# Patient Record
Sex: Male | Born: 1940 | Race: White | Hispanic: No | Marital: Single | Smoking: Current every day smoker
Health system: Southern US, Community
[De-identification: ages and names within clinical notes are randomized; demographics above are authoritative.]

## PROBLEM LIST (undated history)

## (undated) DIAGNOSIS — Z8739 Personal history of other diseases of the musculoskeletal system and connective tissue: Secondary | ICD-10-CM

## (undated) DIAGNOSIS — Z8719 Personal history of other diseases of the digestive system: Secondary | ICD-10-CM

## (undated) DIAGNOSIS — E785 Hyperlipidemia, unspecified: Secondary | ICD-10-CM

## (undated) DIAGNOSIS — N529 Male erectile dysfunction, unspecified: Secondary | ICD-10-CM

## (undated) DIAGNOSIS — D126 Benign neoplasm of colon, unspecified: Secondary | ICD-10-CM

## (undated) DIAGNOSIS — R011 Cardiac murmur, unspecified: Secondary | ICD-10-CM

## (undated) DIAGNOSIS — I4891 Unspecified atrial fibrillation: Secondary | ICD-10-CM

## (undated) DIAGNOSIS — K5730 Diverticulosis of large intestine without perforation or abscess without bleeding: Secondary | ICD-10-CM

## (undated) DIAGNOSIS — H269 Unspecified cataract: Secondary | ICD-10-CM

## (undated) DIAGNOSIS — Z973 Presence of spectacles and contact lenses: Secondary | ICD-10-CM

## (undated) DIAGNOSIS — Z972 Presence of dental prosthetic device (complete) (partial): Secondary | ICD-10-CM

## (undated) DIAGNOSIS — Z86718 Personal history of other venous thrombosis and embolism: Secondary | ICD-10-CM

## (undated) DIAGNOSIS — M1990 Unspecified osteoarthritis, unspecified site: Secondary | ICD-10-CM

## (undated) DIAGNOSIS — Z860101 Personal history of adenomatous and serrated colon polyps: Secondary | ICD-10-CM

## (undated) DIAGNOSIS — K863 Pseudocyst of pancreas: Secondary | ICD-10-CM

## (undated) DIAGNOSIS — E119 Type 2 diabetes mellitus without complications: Secondary | ICD-10-CM

## (undated) DIAGNOSIS — N179 Acute kidney failure, unspecified: Secondary | ICD-10-CM

## (undated) DIAGNOSIS — K859 Acute pancreatitis without necrosis or infection, unspecified: Secondary | ICD-10-CM

## (undated) DIAGNOSIS — K219 Gastro-esophageal reflux disease without esophagitis: Secondary | ICD-10-CM

## (undated) DIAGNOSIS — N486 Induration penis plastica: Secondary | ICD-10-CM

## (undated) DIAGNOSIS — Z86010 Personal history of colonic polyps: Secondary | ICD-10-CM

## (undated) DIAGNOSIS — D709 Neutropenia, unspecified: Secondary | ICD-10-CM

## (undated) DIAGNOSIS — E782 Mixed hyperlipidemia: Secondary | ICD-10-CM

## (undated) DIAGNOSIS — K648 Other hemorrhoids: Secondary | ICD-10-CM

## (undated) DIAGNOSIS — I748 Embolism and thrombosis of other arteries: Secondary | ICD-10-CM

## (undated) DIAGNOSIS — K4090 Unilateral inguinal hernia, without obstruction or gangrene, not specified as recurrent: Secondary | ICD-10-CM

## (undated) DIAGNOSIS — T7840XA Allergy, unspecified, initial encounter: Secondary | ICD-10-CM

## (undated) DIAGNOSIS — N401 Enlarged prostate with lower urinary tract symptoms: Secondary | ICD-10-CM

## (undated) DIAGNOSIS — K8020 Calculus of gallbladder without cholecystitis without obstruction: Secondary | ICD-10-CM

## (undated) DIAGNOSIS — Z7901 Long term (current) use of anticoagulants: Secondary | ICD-10-CM

## (undated) DIAGNOSIS — D649 Anemia, unspecified: Secondary | ICD-10-CM

## (undated) DIAGNOSIS — N138 Other obstructive and reflux uropathy: Secondary | ICD-10-CM

## (undated) DIAGNOSIS — K5790 Diverticulosis of intestine, part unspecified, without perforation or abscess without bleeding: Secondary | ICD-10-CM

## (undated) DIAGNOSIS — I422 Other hypertrophic cardiomyopathy: Secondary | ICD-10-CM

## (undated) DIAGNOSIS — I10 Essential (primary) hypertension: Secondary | ICD-10-CM

## (undated) DIAGNOSIS — C61 Malignant neoplasm of prostate: Secondary | ICD-10-CM

## (undated) HISTORY — DX: Neutropenia, unspecified: D70.9

## (undated) HISTORY — DX: Essential (primary) hypertension: I10

## (undated) HISTORY — DX: Allergy, unspecified, initial encounter: T78.40XA

## (undated) HISTORY — DX: Other hemorrhoids: K64.8

## (undated) HISTORY — DX: Cardiac murmur, unspecified: R01.1

## (undated) HISTORY — DX: Calculus of gallbladder without cholecystitis without obstruction: K80.20

## (undated) HISTORY — PX: ESOPHAGOGASTRODUODENOSCOPY: SHX1529

## (undated) HISTORY — PX: HEMORRHOID SURGERY: SHX153

## (undated) HISTORY — DX: Diverticulosis of intestine, part unspecified, without perforation or abscess without bleeding: K57.90

## (undated) HISTORY — DX: Benign neoplasm of colon, unspecified: D12.6

## (undated) HISTORY — PX: COLONOSCOPY: SHX174

## (undated) HISTORY — DX: Male erectile dysfunction, unspecified: N52.9

## (undated) HISTORY — PX: SIGMOIDOSCOPY: SUR1295

## (undated) HISTORY — DX: Gastro-esophageal reflux disease without esophagitis: K21.9

## (undated) HISTORY — DX: Hyperlipidemia, unspecified: E78.5

## (undated) HISTORY — DX: Unspecified cataract: H26.9

## (undated) HISTORY — DX: Malignant neoplasm of prostate: C61

## (undated) HISTORY — DX: Acute pancreatitis without necrosis or infection, unspecified: K85.90

---
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
## 2005-06-05 ENCOUNTER — Ambulatory Visit: Payer: Self-pay | Admitting: Cardiology

## 2005-07-26 ENCOUNTER — Ambulatory Visit: Payer: Self-pay | Admitting: Cardiology

## 2006-01-24 ENCOUNTER — Encounter: Admission: RE | Admit: 2006-01-24 | Discharge: 2006-01-24 | Payer: Self-pay | Admitting: Nephrology

## 2006-01-29 ENCOUNTER — Ambulatory Visit (HOSPITAL_COMMUNITY): Admission: RE | Admit: 2006-01-29 | Discharge: 2006-01-29 | Payer: Self-pay | Admitting: Nephrology

## 2007-01-16 ENCOUNTER — Inpatient Hospital Stay (HOSPITAL_COMMUNITY): Admission: EM | Admit: 2007-01-16 | Discharge: 2007-01-24 | Payer: Self-pay | Admitting: Emergency Medicine

## 2007-01-17 ENCOUNTER — Ambulatory Visit: Payer: Self-pay | Admitting: Oncology

## 2007-01-17 ENCOUNTER — Encounter: Payer: Self-pay | Admitting: Oncology

## 2007-01-20 IMAGING — CR DG CHEST 2V
2 series · 2 of 2 positions shown · non-contrast
Comparison: [DATE].

CLINICAL DATA: Neutropenia, fever, high blood pressure. 
 CHEST ? 2 VIEW:

[w chest lat]
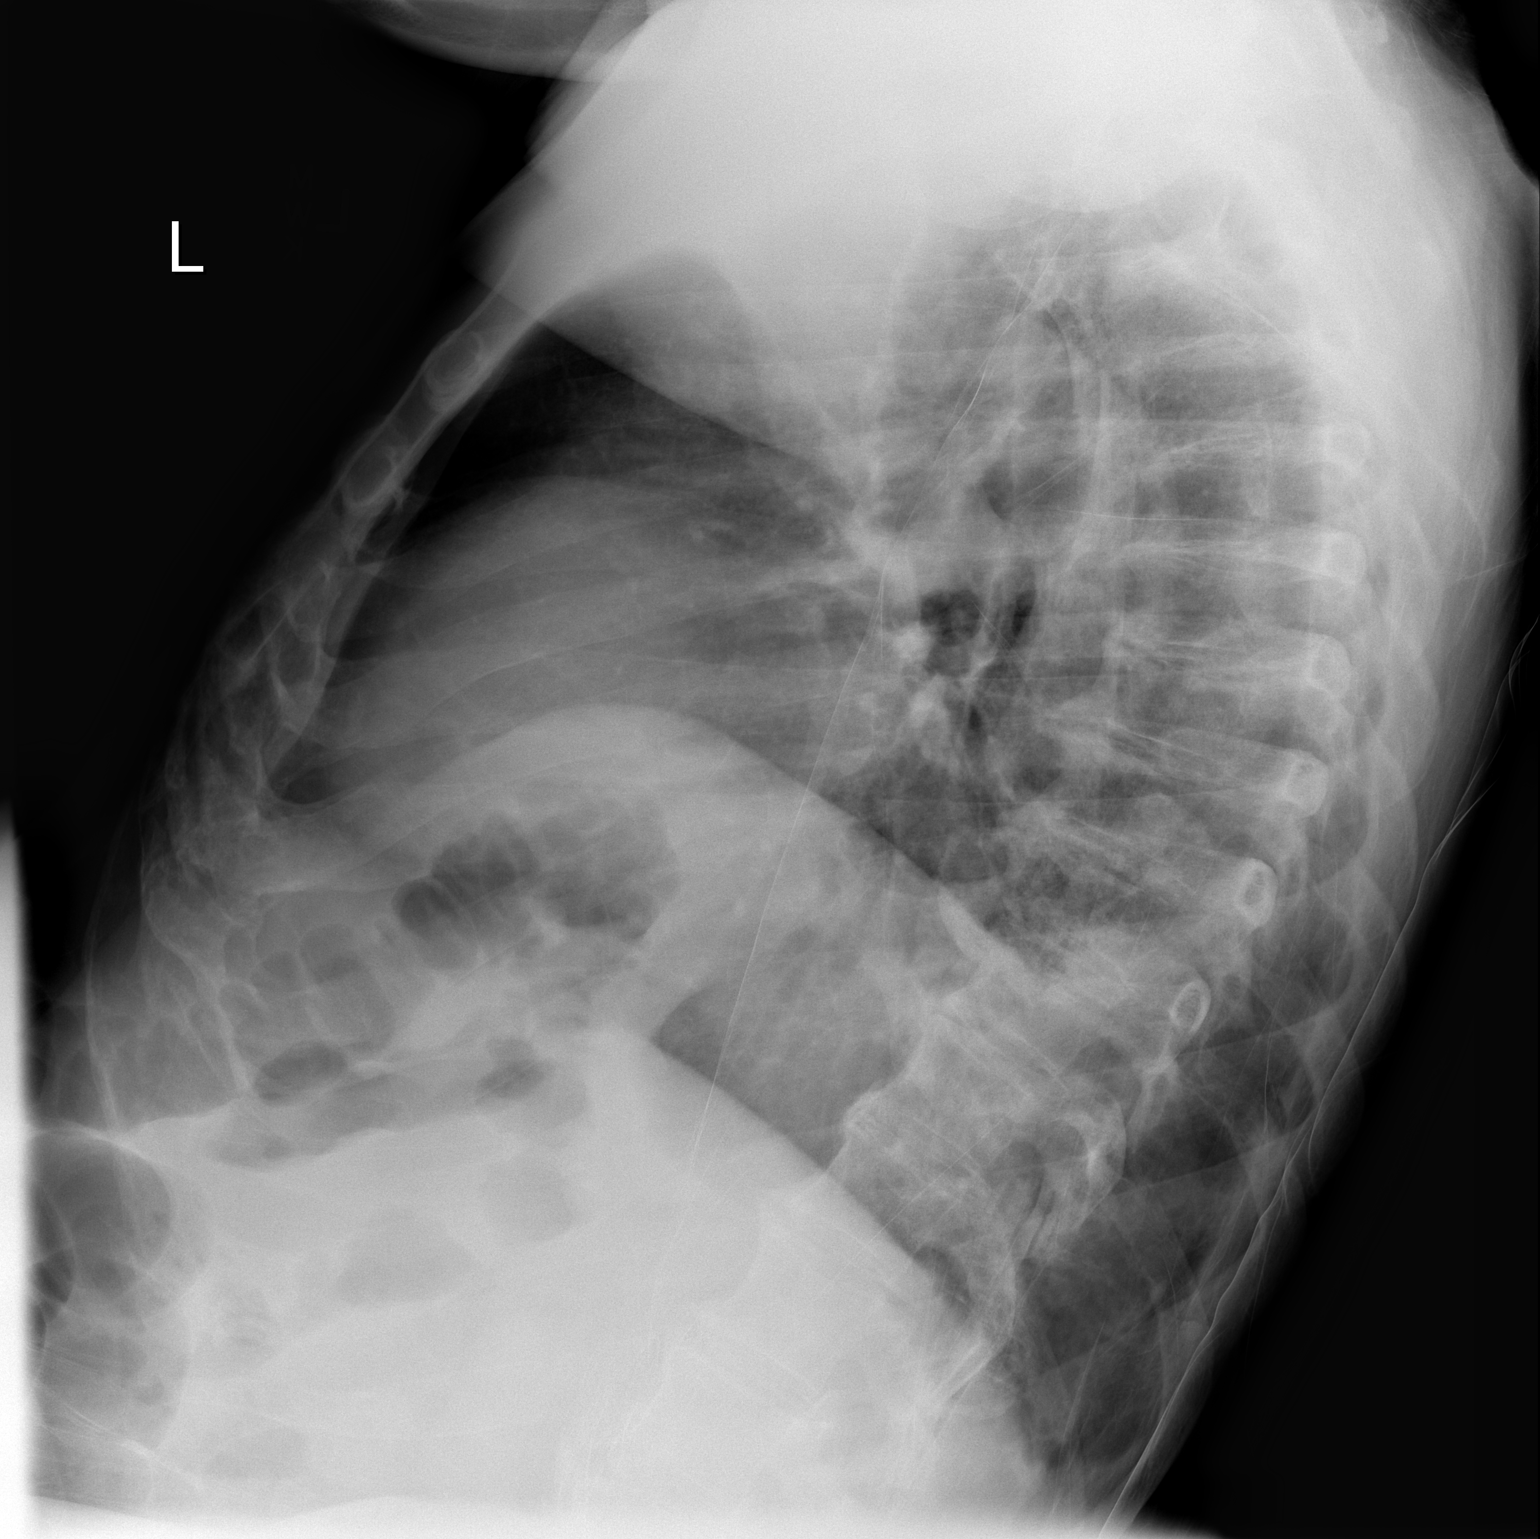

[view not recorded]
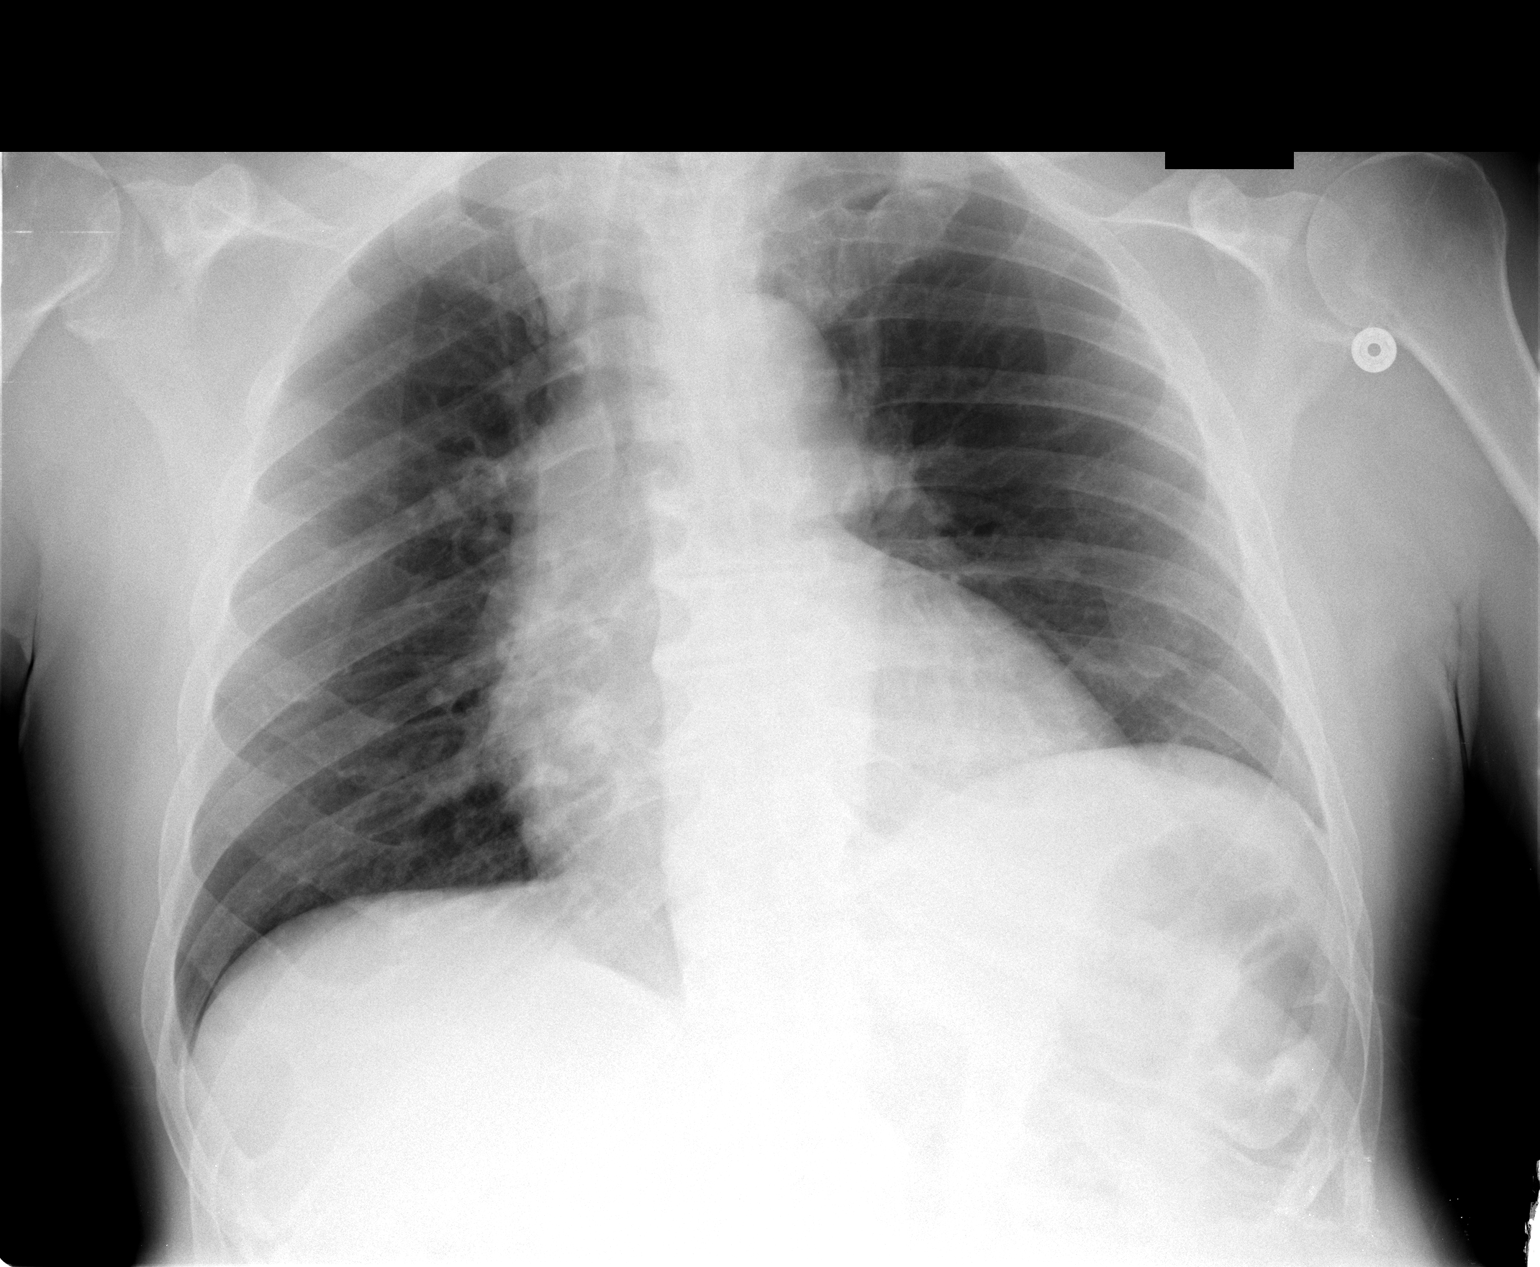

[2 of 2 positions shown; findings below may reference images not displayed]

FINDINGS: Cardiac size upper normal.  No active infiltrate.  No airspace densities.
IMPRESSION: Negative for pneumonia.

## 2007-03-27 ENCOUNTER — Ambulatory Visit: Payer: Self-pay | Admitting: Gastroenterology

## 2007-03-27 LAB — CONVERTED CEMR LAB
Folate: 9.5 ng/mL
Transferrin: 234.8 mg/dL (ref >=212.0)

## 2007-04-22 ENCOUNTER — Encounter: Payer: Self-pay | Admitting: Gastroenterology

## 2007-04-22 ENCOUNTER — Ambulatory Visit: Payer: Self-pay | Admitting: Gastroenterology

## 2010-04-08 ENCOUNTER — Encounter (INDEPENDENT_AMBULATORY_CARE_PROVIDER_SITE_OTHER): Payer: Self-pay | Admitting: *Deleted

## 2010-12-11 HISTORY — PX: COLONOSCOPY: SHX174

## 2011-01-10 NOTE — Letter (Signed)
Summary: Colonoscopy Letter  [facility]  [address] [place], [place] [address]   Phone: [phone]  Fax: [phone]      April 08, 2010 MRN: [id number]   [facility] [address] [place], [place]  [address]   Dear Mr. FORRESTER,   According to your medical record, it is time for you to schedule a Colonoscopy. The American Cancer Society recommends this procedure as a method to detect early colon cancer. Patients with a family history of colon cancer, or a personal history of colon polyps or inflammatory bowel disease are at increased risk.  This letter has beeen generated based on the recommendations made at the time of your procedure. If you feel that in your particular situation this may no longer apply, please contact our office.  Please call our office at [phone] to schedule this appointment or to update your records at your earliest convenience.  Thank you for cooperating with [place] to provide you with the very best care possible.   Sincerely,  Judie Petit T. Russella Dar, M.D.  [facility] [phone]

## 2011-04-28 NOTE — Consult Note (Signed)
NAMEREXTON, GREULICH NO.:  [id number]   MEDICAL RECORD NO.:  [id number]          PATIENT TYPE:  EMS   LOCATION:  MAJO                         FACILITY:  [facility]   PHYSICIAN:  Sharlet Salina T. Hoxworth, M.D.DATE OF BIRTH:  15-Aug-1941   DATE OF CONSULTATION:  01/16/2007  DATE OF DISCHARGE:                                 CONSULTATION   CHIEF COMPLAINT:  Rectal pain.   HISTORY OF PRESENT ILLNESS:  I was asked by Dr. Kandee Keen of the [facility] to evaluate Mr. Camino.  He is a pleasant 70 year old  black male who presents to the [facility] emergency room today with a  chief complaint of rectal pain.  He describes the onset about 5 days ago  of burning anorectal pain with bowel movements.  He states initially the  pain was only with bowel movements but over the past 24 hours has become  more constant, and he presents to the [facility] emergency room for this  reason.  He describes burning pain generally around his anorectal area,  not well-localized, that is worse with bowel movements.  His bowel  movements have otherwise been normal without diarrhea or constipation.  No melena or hematochezia.  He denies any abdominal pain.  He has had  some decreased appetite but no nausea or vomiting.  He generally has  been feeling well without any undue malaise.  He states he has not noted  any fever at home.  He has no urinary burning, frequency.  He has no  history of any similar problems.   PAST MEDICAL HISTORY:  He is followed for hypertension and chronic renal  insufficiency.  Denies surgery.   MEDICATIONS:  1. Metoprolol 100 mg daily.  2. K-Dur 20 mg a day.  3. Lotrel 10/40 mg daily.  4. Lasix 40 b.i.d.  5. Micardis daily.   ALLERGIES:  ALLOPURINOL.   SOCIAL HISTORY:  Married.  Denies cigarettes, alcohol.   FAMILY HISTORY:  Noncontributory.   REVIEW OF SYSTEMS:  GENERAL:  Denies fever, malaise, weight change.  HEENT:  Denies vision, hearing, swallowing  problems.  RESPIRATORY:  Denies shortness of breath, cough, sputum.  ABDOMEN/GI:  Negative as  above.  GU:  Negative as above.   PHYSICAL EXAMINATION:  GENERAL:  Elderly, slightly frail-appearing black  male in no acute distress.  SKIN:  Warm and dry.  No rash or infection.  HEENT:  Sclerae nonicteric.  No palpable masses.  LUNGS:  Clear without wheezing or increased work of breathing.  CARDIAC:  Regular rate and rhythm.  No murmur.  ABDOMEN:  Slightly distended, soft, very minimal right lower quadrant  tenderness to deep palpation but no guarding.  No masses.  No  organomegaly.  RECTAL:  There is an external moderately inflamed hemorrhoid anteriorly  in the midline, but it is not particularly tender externally.  There is  no perirectal induration, tenderness or erythema.  On digital rectal  exam, he is quite tender diffusely without any specific localization.  I  cannot feel any induration, mass, fluctuance or other abnormality.  Prostate  is 2+ enlarged without masses.   LABORATORY:  Urinalysis negative.  BUN is 52, creatinine 2.4.  Hemoglobin is 10.3.  Final white count is still pending but remarkably  is less than 1000.   ASSESSMENT/PLAN:  A 70 year old male with renal insufficiency who  presents with rectal pain, fever and leukopenia.  I find no evidence of  infection or abscess on physical exam other than tenderness.  It is  unclear if his leukopenia is related to infection or a primary process.  We will check a CT scan of the abdomen and pelvis in the emergency room  to try to rule out occult perirectal abscess or infection.  If this is  unrevealing for discrete source of infection, he may need further  medical workup and admission for fever and leukopenia.  Will follow.      Lorne Skeens. Hoxworth, M.D.  Electronically Signed     BTH/MEDQ  D:  01/16/2007  T:  01/17/2007  Job:  [id number]

## 2011-04-28 NOTE — H&P (Signed)
NAMEEDWAR, COE NO.:  [id number]   MEDICAL RECORD NO.:  [id number]          PATIENT TYPE:  INP   LOCATION:  1829                         FACILITY:  [facility]   PHYSICIAN:  Genene Churn. Granfortuna, M.D.DATE OF BIRTH:  1941-07-10   DATE OF ADMISSION:  01/16/2007  DATE OF DISCHARGE:                              HISTORY & PHYSICAL   CHIEF COMPLAINT:  Rectal pain x48 hours.   I was asked to evaluate and admit this patient by the emergency room  attending.  He is a pleasant 70 year old man with hypertension, chronic  renal insufficiency and gout, followed by Dr. Vernon Prey in [place] and  Dr. Camille Bal.  In January of this year he had the sudden onset of  migratory joint pain initially in his back pain, then in his knees,  ankles and shoulders.  He was felt to be having an acute gout attack.  He likely got put on colchicine.  He remembers taking two white pills a  day, which caused diarrhea.  He was subsequently put on allopurinol.  He  was on allopurinol for a few weeks.  He began to develop a diffuse skin  rash, progressive weakness, anorexia, a hoarse voice.  The allopurinol  was stopped.  He was put on a prednisone taper and the rash did improve.  He now presents with 24-48 hour history of increasing rectal pain.  He  felt hot at home and had chills but did not take his temperature.  On  arrival in the emergency department he has a fever of 102.3 degrees.  Laboratory shows total white count markedly decreased at 0.4 with 0  neutrophils.  Hemoglobin is 12, platelet count 200,000.   He is on a number of chronic medications for his hypertension but other  than the colchicine, allopurinol and prednisone has started no other new  medications.  He denies any toxic exposures.  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  No insecticide  exposure.  No recent antibiotics.  He is admitted at this time with  severe agranulocytosis and fever.   The past history is as noted above.  Hypertension.  He denies a history  of heart failure.  Denies myocardial infarction.  No diabetes.  No  emphysema, asthma, tuberculosis, ulcers, hepatitis, yellow jaundice,  kidney stones, thyroid trouble, other gland problems, blood clots or  bleeding disorder.  He was never told he had a low blood count.  He has  had no prior surgical procedures.   CURRENT MEDICATIONS:  1. Micardis 80/12.5 mg daily.  2. Lotrel 10/40 mg daily.  3. Metoprolol 50 mg in the evening and 100 mg in the morning.  4. Lasix 40 mg b.i.d.  5. K-Klor 20 mEq daily.   No known allergies prior to now.   FAMILY HISTORY:  He had seven sisters and one brother.  Two sisters have  died of cancer, one breast, the other brain.   SOCIAL HISTORY:  He is married and wife accompanies him this evening.  They have two sons, who are  healthy.  He does not smoke.  He does not  use alcohol.  No toxic or chemical exposures.  Works as a Production designer, theatre/television/film  man.   REVIEW OF SYSTEMS:  See HPI.  He denied any headache or change in  vision.  No cough, no dysphagia.  No dyspnea, no chest pain or pressure.  No palpitations.  No abdominal pain.  No hematochezia.  No melena.  No  hematuria.   PHYSICAL EXAMINATION:  GENERAL:  Acutely ill-appearing African American  man.  VITAL SIGNS:  Pulse 98 and regular, blood pressure 122/59, temperature  102.3, respirations 20.  SKIN:  There is a fading diffuse erythematous rash most prominent on his  arms, also on the trunk, with areas of early desquamation.  There are no  ecchymoses or petechiae.  HEENT:  Pupils are equal, reactive to light.  Pharynx without erythema  or exudate.  NECK:  Supple.  No thyromegaly.  LUNGS:  Clear and resonant to percussion.  Regular cardiac rhythm with a  3/6 systolic murmur at the apex and a 2/6 systolic murmur at the second  right intercostal space.  (The patient states he has been told he has  heart murmurs).  LYMPHATIC:  There  is no lymphadenopathy.  ABDOMEN:  Soft, distended, tympanitic but not tender.  No palpable mass  or organomegaly.  GENITOURINARY:  Uncircumcised penis.  Perirectal exam reveals a large  inflamed and possibly thrombosed external hemorrhoid.  Palpation around  the anus revealed no tenderness or fluctuance.  EXTREMITIES:  No edema.  NEUROLOGIC:  Mental status intact.  Cranial nerves intact.  Motor  strength 5/5.  Reflexes 1+, symmetric.   LABORATORY DATA:  Hemoglobin 10.3, hematocrit 31, MCV 93, white count  0.4, 0 neutrophils, platelets 201,000.  BUN 52, creatinine 2.4, sodium  131, potassium 3.4.  A urinalysis is normal.  A chest radiograph shows  no infiltrate.   There are no blood values available for comparison.   IMPRESSION:  1. Agranulocytosis.  This is likely due to allopurinol although      colchicine is also suspect.  2. Febrile neutropenia secondary to #1.  3. Inflamed external hemorrhoids secondary to neutropenia.  No obvious      abscess at this time.  He has already been seen by a surgical      consult this evening, who concurs with my exam.  4. Essential hypertension.  5. Chronic renal failure.  6. Gout?   PLAN:  Begin broad-spectrum parenteral antibiotics with good anaerobic  coverage in view of rectal inflammation.   I will do a bone marrow aspiration and biopsy to exclude other  pathology, but at this point I feel there is a low likelihood that he  has a leukemic process.  His hemoglobin is normal adjusted for his renal  dysfunction and his platelet count is normal.  On my review of the  peripheral blood film this evening, there are no neutrophils but there  are mature lymphocytes, normal platelets, and no immature cells.   Once a sample a bone marrow is obtained, I will put him on a trial of  Neupogen to accelerate white count recovery.   We will need to obtain some outpatient records to get some comparison  blood counts.      Genene Churn. Cyndie Chime,  M.D.  Electronically Signed     JMG/MEDQ  D:  01/16/2007  T:  01/17/2007  Job:  [id number]   cc:   Ernestina Penna, M.D.  [facility] Salvia Eliott Nine,  M.D. 

## 2011-04-28 NOTE — Discharge Summary (Signed)
Alan [name], Alan [name] NO.:  [id number]   MEDICAL RECORD NO.:  [id number]          PATIENT TYPE:  INP   LOCATION:  5002                         FACILITY:  [facility]   PHYSICIAN:  Genene Churn. [name], M.D.DATE OF BIRTH:  10/16/1941   DATE OF ADMISSION:  01/16/2007  DATE OF DISCHARGE:                               DISCHARGE SUMMARY   Alan [name] is a 70 year old man with hypertension, chronic renal  insufficiency, and gout.  He had a flare-up of his gout in January with  diffuse migratory arthralgias and back pain.  He was found to have uric  acid of 14.6.  He was initially treated with colchicine.  He was then  started on allopurinol.  At time of a January 28 follow-up visit with  his nephrologist, he had developed a skin rash. Allergy to allopurinol  was suspected.  The drug was stopped.  Of note, a blood count done on  that day showed a hemoglobin of 12.6, hematocrit 37.6, white count 6700  with 76% neutrophils, 13% lymphocyte and platelet count of 253,000.   Over the next week, the rash got worse.  He began to develop progressive  weakness, anorexia, hoarse voice.  He was started on a course of  prednisone by his family physician.  The rash did start to improve but  his other symptoms persisted.  He presented to the emergency department  with a 24- to 48-hour history of acute onset of rectal pain.  On arrival  in the ED, he was found to be febrile to 102.3 degrees.  Laboratory  showed a markedly decreased white count, total white count 0.4 with 0  neutrophils, hemoglobin 12, platelet count 200,000.  I was the  hematologist on call that evening, and I was asked by the emergency  department to evaluate him and I admitted him for further treatment.   INITIAL EXAMINATION:  An acutely ill-appearing African American man,  pulse 98 regular, blood pressure 122/59, temperature 102.3, respirations  20.  There was a fading diffuse erythematous rash with some desquamation on  his arms and trunk.  No ecchymosis or petechiae.  Clear lungs.  A 2/6 aortic systolic murmur and a 2/6 systolic murmur at the left  sternal border.  No adenopathy.  No organomegaly.  ABDOMEN:  Nontender.  Uncircumcised penis.  Perirectal exam revealed a large, inflamed and  thrombosed external hemorrhoid.  Palpation around the anus revealed no  tenderness or fluctuance.   HOSPITAL COURSE:  He was cultured and started on broad-spectrum  parenteral antibiotics.  I reviewed the peripheral blood film which  showed complete absence of any granulocytes.  There were some benign  lymphocytes and a rare monocyte.  No eosinophils.  It appeared likely  given the normal hemoglobin corrected for his chronic renal dysfunction  and normal platelet count that this was in fact a toxic drug reaction to  the allopurinol.  He was started on Zosyn.  He remained febrile to over  103 degrees for the next 72 hours until antibiotics got the infection  under control.  Vancomycin was added at  72 hours and then stopped when  cultures remained negative and his white count began to recover.  He  received a total course of 11 days of Zosyn.  All cultures remained  sterile including those done at the time of emergency room evaluation  and those repeated  at 72 hours while on antibiotics.  He was afebrile  for greater than 48 hours prior to hospital discharge.  He was afebrile  for greater than 48 hours prior to discharge.  In view of his heart  murmurs, I felt he needed a more prolonged course of antibiotics, and he  will go home on oral Cipro for an additional 7 days.   I did a bone marrow aspiration and biopsy on the morning after admission  and reviewed this with the pathologist.  There was a virtual absence of  granulocytes.  There was an increased number of benign appearing  eosinophils compatible with a drug reaction.  There was a reactive  plasmacytosis of 10%.   I elected to put him on a daily injection of  Neupogen in an attempt to  accelerate white count recovery and improve control of infection.  Blood  counts did recover over the next few days.  By hospital day #4, WBC was  0.7.  By day 7 on February 11, white count up to 3100 with recovery of  neutrophils now 39% of the white count differential.  By February 12,  total white count was 13,000 with 75% neutrophils, and the Neupogen was  discontinued.  On February 13, white count was up to 20,000 with 77%  neutrophil.  Hemoglobin at that time 10 with hematocrit 29% and platelet  count 590,000.   In view of his chronic hypertension and renal insufficiency on multiple  antihypertensives and in view initial hypotension on admission, I asked  nephrology to consult, and they followed the patient closely throughout  the hospital admission making adjustments in his medications on an as-  needed basis.  They were able to get him off some of his chronic  medications.  Final doses will be listed below.   Initial urinalysis was normal.  Initial BUN 52 with a creatinine of 1.8.  He did develop some problems with postvoid residual urine up to 500 mL.  He was started on some Flomax.  A urology consultation was obtained.  On  the Flomax, his post void residuals went down to essentially zero.  His  creatinine with gentle hydration and withholding some of his  antihypertensives temporarily improved quite significantly, and  creatinine was down to 1.2 with a BUN of 14 as of January 23, 2007.  A  PSA was 2.4.   He did develop transient hypokalemia and hypophosphatemia.  Electrolytes  were replaced both orally and parenterally.  By February 13, phosphorus  level up to 3.1, potassium 3.6.   A surgical consultation was obtained on the evening of admission to  follow the thrombosed external hemorrhoid and to rule out perirectal  abscess.  Direct examination revealed no suspicion for abscess formation.  A CT scan of the abdomen and pelvis without contrast  done on  the night of admission showed some thickening of the gastric antrum.  Some diverticular changes in the colon.  A focal area of wall thickening  in the proximal sigmoid colon with some subtle pericolonic  edema/inflammation but no evidence for obstruction or abscess formation.  No pelvic adenopathy.  Prostate gland enlarged.  His bladder distended.  No intraperitoneal free air.  Surgery followed the patient  intermittently during the admission.  No surgical manipulation was  indicated at present time.  In fact, I wanted to avoid any  instrumentation while his white count was low.   He had a full recovery and was stable for discharge on January 24, 2007.   CONSULTATIONS:  1. General surgery.  2. Nephrology.   PROCEDURES:  1. Bone marrow aspiration and biopsy.   DIAGNOSES:  1. Agranulocytosis secondary to allopurinol.  2. 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  3. Febrile neutropenia secondary to agranulocytosis.  4. Thrombosed-infected external rectal hemorrhoid.  5. Essential hypertension.  6. Chronic renal insufficiency.  7. Hypokalemia.  8. Hypophosphatemia.  9. Benign prostatic hypertrophy.  10.Transient urinary retention secondary to #9.  11.Gout.   DISPOSITION:  Condition stable at time of discharge.  Resume regular  activity, regular diet.  He will be followed by his primary care  physician and his nephrologist.   MEDICATIONS TO INCLUDE:  1. Lasix 40 mg daily - decreased from 40 mg b.i.d.  2. Potassium 20 mEq daily.  3. Lopressor 50 mg b.i.d. - decreased from 150 mg daily.  4. Lotrel 5/20 - decreased from 10/40 - one daily.  5. Cipro 500 mg b.i.d. for 7 days.   He will stop Micardis.  ALLOPURINOL WILL BE SUBSEQUENTLY LISTED AS A  MAJOR ALLERGY.  I WOULD PROBABLY ALSO AVOID COLCHICINE ALTHOUGH IT SEEMS  MORE LIKELY THAT ALLOPURINOL WAS THE OFFENDING DRUG.  He will need to  have alternative uricosuric  medication to control subsequent gout  attacks.      Genene Churn. Cyndie Chime, M.D.  Electronically Signed     JMG/MEDQ  D:  01/24/2007  T:  01/24/2007  Job:  [id number]   cc:   Duke Salvia. Eliott Nine, M.D.  Ernestina Penna, M.D.

## 2011-04-28 NOTE — Assessment & Plan Note (Signed)
[facility]                         GASTROENTEROLOGY OFFICE NOTE   NAME:Alan [name], Alan [name]                           MRN:          [id number]  DATE:03/27/2007                            DOB:          05/19/1941    REFERRING PHYSICIAN:  SCOTT LONG, P. A. C.   REASON FOR REFERRAL:  Anemia.   HISTORY OF PRESENT ILLNESS:  This is a 70 year old African-American  male, referred through the courtesy of Scott Long, P.A.-C.  He was noted  to have a mild normocytic anemia.  Blood work from February 27, 2007,  showed a hemoglobin of 11.9 with an MCV of 93.  Prior hemoglobin on  March 08, 2007, showed a hemoglobin of 10.6.  He was hospitalized in  February of 2008 for neutropenia and a drug eruption, secondary to  allopurinol.  On that admission, he had rectal pain and a temperature of  102.3.  His total white blood cell count was 0.4 with no neutrophils  noted.  His rectal pain resolved with antibiotics.  He did have a  thrombosed hemorrhoid.  No evidence of a perirectal abscess was noted.  A CT scan of the abdomen and pelvis, done without contrast, showed  thickening of the gastric antrum and diverticulosis.  There are subtle  changes in the proximal sigmoid colon which suggest some pericolonic  inflammation and he was treated for possible diverticulitis.  After his  neutropenia resolved, all of his gastrointestinal complaints have  resolved.  He has no ongoing problems with diarrhea, constipation,  melena, hematochezia, or changes in stool caliber.  There is no family  history of colon cancer, colon polyps or inflammatory bowel disease.  He  states he had a flexible sigmoidoscopy performed around 2004 or 2005 at  [facility] that was normal.  Recent Hemoccults  were negative times three.   PAST MEDICAL HISTORY:  Hypertension, diverticulosis, external  hemorrhoids, status post hemorrhoidectomy.   CURRENT MEDICATIONS:  Listed on the chart,  updated and reviewed.   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   SOCIAL HISTORY:  Per the handwritten form.   PHYSICAL EXAM:  Well-developed, well-nourished, no acute distress.  Height 5 feet 6 inches, weight 173.2 pounds.  Blood pressure is 142/80,  pulse 72 and regular.  HEENT EXAM:  Anicteric sclerae.  Oropharynx is clear.  CHEST:  Clear to auscultation bilaterally.  CARDIAC:  Regular rate and rhythm without murmurs appreciated.  ABDOMEN:  Soft, nontender, nondistended.  Normoactive bowel sounds.  No  palpable organomegaly, masses or hernias.  RECTAL EXAMINATION:  Deferred to time of colonoscopy.  EXTREMITIES:  Without clubbing, cyanosis or edema.  NEUROLOGIC:  Alert and oriented times three, grossly nonfocal.   ASSESSMENT AND PLAN:  1. Normocytic anemia. Diverticulosis with a recent episode of      suspected diverticulitis in the setting of neutropenia.  Rule out      colorectal neoplasms and other disorders.  We will obtain an iron,      TIBC, ferritin, B12 and folate today.  Risks, benefits, and  alternatives to colonoscopy and possible biopsy and possible      polypectomy were discussed with the patient.  He consents to      proceed.  This will be scheduled electively.  He is advised to      maintain a long-term high-fiber diet with adequate fluid intake.     Venita Lick. Russella Dar, MD, [facility]  Electronically Signed    MTS/MedQ  DD: 03/27/2007  DT: 03/27/2007  Job #: [id number]

## 2011-04-28 NOTE — Consult Note (Signed)
NAMECHEVIS, WEISENSEL                  ACCOUNT NO.:  [id number]   MEDICAL RECORD NO.:  [id number]          PATIENT TYPE:  INP   LOCATION:  5002                         FACILITY:  [facility]   PHYSICIAN:  Excell Seltzer. Annabell Howells, M.D.    DATE OF BIRTH:  [birth year]   DATE OF CONSULTATION:  01/21/2007  DATE OF DISCHARGE:                                 CONSULTATION   REFERRING PHYSICIAN:  Dr. Genene Churn. [place].   REASON FOR CONSULTATION:  Mr. Alverio is a 70 year old black male admitted  on January 16, 2007 with a chief complaint of rectal pain.  He has a  history of gout and had a bout of probable acute gout in January 2008.  He received colchicine and allopurinol for several weeks.  He then began  to develop a diffuse skin rash with weakness, hoarseness and anorexia.  The allopurinol was stopped and he was placed on prednisone.  He  presented to the hospital with rectal pain and a fever.  Since  admission, he has been found to have a thrombosed hemorrhoid, but more  importantly, an agranulocytosis felt to be related to the gout.  The  patient has also been found to have an elevated residual urine of over  500 mL.  He has been started on Flomax 0.4 mg daily.  His admission  creatinine was 2.4.  A CT scan revealed a distended bladder with mild  fullness of the renal collecting systems bilaterally.  He denied any  significant voiding complaints or urologic history prior to admission  and really notice no problems with voiding during admission.  He voids  primarily when he goes to the bathroom, as he is having frequent  diarrheal stools and has noted above, he was placed on Flomax with  improvement in his voiding complaints.  He currently denies hesitancy,  frequency, urgency, dysuria or a sensation of incomplete emptying.  He  has no abdominal discomfort.   PAST MEDICAL HISTORY:   ALLERGIES:  Significant for intolerance to ALLOPURINOL.   ADMISSION MEDICATIONS:  Include metoprolol, K-Dur, Lotrel,  Lasix and  Micardis.   CURRENT MEDICATIONS:  Include Hydrocodone cream, Neupogen, Flomax,  Zosyn, phosphorus, vancomycin, and Tylenol.   MEDICAL HISTORY:  Significant for hypertension and chronic renal  insufficiency as well as gout.  He also has a history of a heart murmur.   PAST SURGICAL HISTORY:  He denies prior surgical therapy.   SOCIAL HISTORY:  He is married.  He does not smoke or use alcohol.   FAMILY HISTORY:  Pertinent for cancer in 2 sisters, 1 in breast, 1 in  the brain.   REVIEW OF SYSTEMS:  He currently reports persistent diarrhea and a skin  rash.  He denies abdominal pain, but is having some rectal discomfort  from his hemorrhoid.  He is otherwise entirely without complaints.   EXAM:  VITAL SIGNS:  His blood pressure is 130/73, heart rate 106,  temperature 97.9, respirations 18.  GENERAL:  He is a well-developed, well-nourished black male in no acute  distress, alert and oriented x3.  SKIN:  Has a  diffuse rash, primarily on the extremities:  ABDOMEN:  Soft and moderately distended with a small umbilical hernia,  but no masses or tenderness are noted.  The bladder is not palpable.  He  has no inguinal hernias or adenopathy.  GU:  Exam reveals an uncircumcised phallus with an adequate meatus.  Scrotum is unremarkable.  Testicles are descended without mass.  Epididymides are unremarkable.  RECTAL:  Exam was not repeated because of the pain and well-described  anatomy.  He is report to have a large inflamed external hemorrhoid with  no rectal masses and a 2+, benign-feeling prostate gland   LABORATORY DATA:  On review of his admission labs, his urinalysis was  negative.  His urine culture on February 9 was negative.  His admission  creatinine was 2.4, but his most recent creatinine was down to 1.19.   I have reviewed his CT films and report and I am unimpressed by any  dilation of the collecting systems, but he did have a distended bladder  with moderate prostate  enlargement.   IMPRESSION:  1. Elevated postvoid residual urine probably secondary to benign      prostatic hypertrophy, but this could be aggravated by pelvic floor      spasm from the thrombosed hemorrhoid.  2. The voiding has improved with Flomax.  3. Agranulocytosis likely secondary to allopurinol.   RECOMMENDATIONS:  1. I would continue the Flomax.  2. I would like to repeat the bladder scans for PVR x2 and record in      the chart to better assess his response to Flomax.  3. I have encouraged him to double-void; he should be this every 4      hours to try to improve emptying.  4. We need a PSA level.  5. Further evaluation will likely need to wait until he has had      recovery from his agranulocytosis.      Excell Seltzer. Annabell Howells, M.D.  Electronically Signed     JJW/MEDQ  D:  01/21/2007  T:  01/22/2007  Job:  [id number]   cc:   Genene Churn. Cyndie Chime, M.D.  Dr. Marlane Hatcher T. Hoxworth, M.D.

## 2011-05-16 ENCOUNTER — Encounter: Payer: Self-pay | Admitting: Physician Assistant

## 2011-05-26 ENCOUNTER — Ambulatory Visit (INDEPENDENT_AMBULATORY_CARE_PROVIDER_SITE_OTHER): Payer: Medicare Other | Admitting: Urology

## 2011-05-26 DIAGNOSIS — C61 Malignant neoplasm of prostate: Secondary | ICD-10-CM

## 2011-05-26 DIAGNOSIS — N401 Enlarged prostate with lower urinary tract symptoms: Secondary | ICD-10-CM

## 2011-05-26 DIAGNOSIS — N529 Male erectile dysfunction, unspecified: Secondary | ICD-10-CM

## 2011-05-26 DIAGNOSIS — R3129 Other microscopic hematuria: Secondary | ICD-10-CM

## 2011-07-06 ENCOUNTER — Telehealth: Payer: Self-pay

## 2011-07-06 NOTE — Telephone Encounter (Signed)
-----   Message -----    From: Marylynn Pearson    Sent: 07/06/2011   2:41 PM      To: Christie Nottingham, CMA  Patient called back and scheduled colon.

## 2011-08-10 ENCOUNTER — Ambulatory Visit (AMBULATORY_SURGERY_CENTER): Payer: Medicare Other | Admitting: *Deleted

## 2011-08-10 ENCOUNTER — Encounter: Payer: Self-pay | Admitting: Gastroenterology

## 2011-08-10 VITALS — Ht 65.0 in | Wt 189.0 lb

## 2011-08-10 DIAGNOSIS — Z1211 Encounter for screening for malignant neoplasm of colon: Secondary | ICD-10-CM

## 2011-08-10 MED ORDER — SUPREP BOWEL PREP KIT 17.5-3.13-1.6 GM/177ML PO SOLN: 1.0000 | ORAL | Status: DC

## 2011-08-24 ENCOUNTER — Ambulatory Visit (AMBULATORY_SURGERY_CENTER): Payer: Medicare Other | Admitting: Gastroenterology

## 2011-08-24 ENCOUNTER — Encounter: Payer: Self-pay | Admitting: Gastroenterology

## 2011-08-24 VITALS — BP 148/71 | HR 55 | Temp 97.8°F | Resp 14 | Ht 65.0 in | Wt 189.0 lb

## 2011-08-24 DIAGNOSIS — Z86010 Personal history of colonic polyps: Secondary | ICD-10-CM

## 2011-08-24 DIAGNOSIS — Z1211 Encounter for screening for malignant neoplasm of colon: Secondary | ICD-10-CM

## 2011-08-24 MED ORDER — SODIUM CHLORIDE 0.9 % IV SOLN: 500.0000 mL | INTRAVENOUS | Status: DC

## 2011-08-24 NOTE — Patient Instructions (Signed)
HANDOUTS GIVEN ON DIVERTICULOSIS, HIGH FIBER DIET, AND HEMORRHOIDS  DISCHARGE INSTRUCTIONS GIVEN ON BLUE AND GREEN SHEETS  LIBERAL FLUID INTAKE WITH THE HIGH FIBER DIET AS WELL  REPEAT COLONOSCOPY IN 5 YEARS PER DR STARK.

## 2011-08-25 ENCOUNTER — Telehealth: Payer: Self-pay | Admitting: *Deleted

## 2011-08-25 NOTE — Telephone Encounter (Signed)
No answer, no identifier on answering machine, no message left for the patient. 

## 2011-11-24 ENCOUNTER — Ambulatory Visit (INDEPENDENT_AMBULATORY_CARE_PROVIDER_SITE_OTHER): Payer: Medicare Other | Admitting: Urology

## 2011-11-24 DIAGNOSIS — C61 Malignant neoplasm of prostate: Secondary | ICD-10-CM

## 2011-11-24 DIAGNOSIS — R3129 Other microscopic hematuria: Secondary | ICD-10-CM

## 2011-11-24 DIAGNOSIS — N529 Male erectile dysfunction, unspecified: Secondary | ICD-10-CM

## 2011-11-24 DIAGNOSIS — N401 Enlarged prostate with lower urinary tract symptoms: Secondary | ICD-10-CM

## 2012-06-21 ENCOUNTER — Ambulatory Visit (INDEPENDENT_AMBULATORY_CARE_PROVIDER_SITE_OTHER): Payer: Medicare Other | Admitting: Urology

## 2012-06-21 DIAGNOSIS — C61 Malignant neoplasm of prostate: Secondary | ICD-10-CM

## 2012-06-21 DIAGNOSIS — N529 Male erectile dysfunction, unspecified: Secondary | ICD-10-CM

## 2012-06-21 DIAGNOSIS — N401 Enlarged prostate with lower urinary tract symptoms: Secondary | ICD-10-CM

## 2012-06-21 DIAGNOSIS — R3129 Other microscopic hematuria: Secondary | ICD-10-CM

## 2012-07-31 LAB — HM DIABETES EYE EXAM

## 2012-12-17 ENCOUNTER — Ambulatory Visit (INDEPENDENT_AMBULATORY_CARE_PROVIDER_SITE_OTHER): Payer: Medicare Other | Admitting: Urology

## 2012-12-17 DIAGNOSIS — C61 Malignant neoplasm of prostate: Secondary | ICD-10-CM

## 2012-12-17 DIAGNOSIS — N400 Enlarged prostate without lower urinary tract symptoms: Secondary | ICD-10-CM

## 2013-02-26 ENCOUNTER — Telehealth: Payer: Self-pay | Admitting: Physician Assistant

## 2013-02-26 MED ORDER — FEBUXOSTAT 40 MG PO TABS: 80.0000 mg | ORAL_TABLET | Freq: Every day | ORAL | Status: DC

## 2013-02-26 NOTE — Telephone Encounter (Signed)
Samples at front pt aware.

## 2013-02-26 NOTE — Telephone Encounter (Signed)
Patient needs samples of Uloric and Niaspan.

## 2013-03-03 ENCOUNTER — Other Ambulatory Visit: Payer: Self-pay | Admitting: Physician Assistant

## 2013-04-29 ENCOUNTER — Ambulatory Visit (INDEPENDENT_AMBULATORY_CARE_PROVIDER_SITE_OTHER): Payer: Medicare Other | Admitting: Family Medicine

## 2013-04-29 ENCOUNTER — Encounter: Payer: Self-pay | Admitting: Family Medicine

## 2013-04-29 VITALS — BP 176/76 | HR 53 | Temp 96.8°F | Ht 66.0 in | Wt 192.0 lb

## 2013-04-29 DIAGNOSIS — E669 Obesity, unspecified: Secondary | ICD-10-CM

## 2013-04-29 DIAGNOSIS — E782 Mixed hyperlipidemia: Secondary | ICD-10-CM | POA: Insufficient documentation

## 2013-04-29 DIAGNOSIS — N400 Enlarged prostate without lower urinary tract symptoms: Secondary | ICD-10-CM | POA: Insufficient documentation

## 2013-04-29 DIAGNOSIS — E119 Type 2 diabetes mellitus without complications: Secondary | ICD-10-CM | POA: Insufficient documentation

## 2013-04-29 DIAGNOSIS — M109 Gout, unspecified: Secondary | ICD-10-CM | POA: Insufficient documentation

## 2013-04-29 DIAGNOSIS — E1169 Type 2 diabetes mellitus with other specified complication: Secondary | ICD-10-CM | POA: Insufficient documentation

## 2013-04-29 DIAGNOSIS — I10 Essential (primary) hypertension: Secondary | ICD-10-CM | POA: Insufficient documentation

## 2013-04-29 DIAGNOSIS — E785 Hyperlipidemia, unspecified: Secondary | ICD-10-CM

## 2013-04-29 LAB — BASIC METABOLIC PANEL WITH GFR
BUN: 11 mg/dL (ref 6–23)
CO2: 30 mEq/L (ref 19–32)
Calcium: 10 mg/dL (ref 8.4–10.5)
Chloride: 98 mEq/L (ref 96–112)
Creat: 1.06 mg/dL (ref 0.50–1.35)
GFR, Est African American: 82 mL/min
GFR, Est Non African American: 71 mL/min
Glucose, Bld: 114 mg/dL — ABNORMAL HIGH (ref 70–99)
Potassium: 3.9 mEq/L (ref 3.5–5.3)
Sodium: 140 mEq/L (ref 135–145)

## 2013-04-29 LAB — HEPATIC FUNCTION PANEL
ALT: 22 U/L (ref 0–53)
AST: 22 U/L (ref 0–37)
Albumin: 4.5 g/dL (ref 3.5–5.2)
Alkaline Phosphatase: 51 U/L (ref 39–117)
Bilirubin, Direct: 0.2 mg/dL (ref 0.0–0.3)
Indirect Bilirubin: 0.7 mg/dL (ref 0.0–0.9)
Total Bilirubin: 0.9 mg/dL (ref 0.3–1.2)
Total Protein: 7.7 g/dL (ref 6.0–8.3)

## 2013-04-29 LAB — URIC ACID: Uric Acid, Serum: 7.1 mg/dL — ABNORMAL HIGH (ref 4.0–6.0)

## 2013-04-29 LAB — POCT GLYCOSYLATED HEMOGLOBIN (HGB A1C): Hemoglobin A1C: 5.8

## 2013-04-29 LAB — POCT UA - MICROALBUMIN

## 2013-04-29 MED ORDER — FEBUXOSTAT 40 MG PO TABS: 40.0000 mg | ORAL_TABLET | Freq: Every day | ORAL | Status: DC

## 2013-04-29 MED ORDER — METOPROLOL TARTRATE 50 MG PO TABS: 50.0000 mg | ORAL_TABLET | Freq: Every day | ORAL | Status: DC

## 2013-04-29 MED ORDER — BENAZEPRIL HCL 20 MG PO TABS: 20.0000 mg | ORAL_TABLET | Freq: Every day | ORAL | Status: DC

## 2013-04-29 MED ORDER — FEBUXOSTAT 40 MG PO TABS: 80.0000 mg | ORAL_TABLET | Freq: Every day | ORAL | Status: DC

## 2013-04-29 MED ORDER — AMLODIPINE BESYLATE 10 MG PO TABS: 10.0000 mg | ORAL_TABLET | Freq: Every day | ORAL | Status: DC

## 2013-04-29 MED ORDER — METOPROLOL SUCCINATE ER 100 MG PO TB24: 100.0000 mg | ORAL_TABLET | ORAL | Status: DC

## 2013-04-29 MED ORDER — PRAVASTATIN SODIUM 40 MG PO TABS: 40.0000 mg | ORAL_TABLET | Freq: Every day | ORAL | Status: DC

## 2013-04-29 MED ORDER — FINASTERIDE 5 MG PO TABS: 5.0000 mg | ORAL_TABLET | Freq: Every day | ORAL | Status: DC

## 2013-04-29 MED ORDER — METFORMIN HCL 500 MG PO TABS: 500.0000 mg | ORAL_TABLET | Freq: Two times a day (BID) | ORAL | Status: DC

## 2013-04-29 MED ORDER — FUROSEMIDE 40 MG PO TABS: 40.0000 mg | ORAL_TABLET | Freq: Two times a day (BID) | ORAL | Status: DC

## 2013-04-29 MED ORDER — NIACIN ER (ANTIHYPERLIPIDEMIC) 1000 MG PO TBCR: 1000.0000 mg | EXTENDED_RELEASE_TABLET | Freq: Every day | ORAL | Status: DC

## 2013-04-29 NOTE — Progress Notes (Signed)
Patient ID: Alan [name], male   DOB: [birth year], 72 y.o.   MRN: [id number] SUBJECTIVE: HPI: Patient is here for follow up of Diabetes Mellitus: Symptoms of DM: Denies Nocturia ,Denies Urinary Frequency , denies Blurred vision ,deniesDizziness,denies.Dysuria, denies paresthesias, denies extremity pain or ulcers. Marland Kitchendenies chest pain. has had an annual eye exam. Approximately 3 months ago. do check the feet. Does check CBGs. Average CBG: dooesn't check CBGs. Denies episodes of hypoglycemia. Does have an emergency hypoglycemic plan. admits toCompliance with medications. Denies Problems with medications.   PMH/PSH: reviewed/updated in Epic  SH/FH: reviewed/updated in Epic  Allergies: reviewed/updated in Epic  Medications: reviewed/updated in Epic  Immunizations: reviewed/updated in Epic  ROS: As above in the HPI. All other systems are stable or negative.  OBJECTIVE: APPEARANCE:  Patient in no acute distress.The patient appeared well nourished and normally developed. Acyanotic. Waist:40.5 inches  VITAL SIGNS:BP 176/76  Pulse 53  Temp(Src) 96.8 F (36 C) (Oral)  Ht 5\' 6"  (1.676 m)  Wt 192 lb (87.091 kg)  BMI 31 kg/m2 Obese Male   SKIN: warm and  Dry without overt rashes, tattoos and scars  HEAD and Neck: without JVD, Head and scalp: normal Eyes:No scleral icterus. Fundi normal, eye movements normal. Ears: Auricle normal, canal normal, Tympanic membranes normal, insufflation normal. Nose: normal Throat: normal Neck & thyroid: normal  CHEST & LUNGS: Chest wall: normal Lungs: Clear  CVS: Reveals the PMI to be normally located. Regular rhythm, First and Second Heart sounds are normal,  absence of murmurs, rubs or gallops. Peripheral vasculature: Radial pulses: normal Dorsal pedis pulses: normal Posterior pulses: normal  ABDOMEN:  Appearance: obese  Benign,, no organomegaly, no masses, no Abdominal Aortic enlargement. No Guarding , no rebound. No Bruits. Bowel  sounds: normal  RECTAL: N/A GU: N/A  EXTREMETIES: nonedematous. Both Femoral and Pedal pulses are normal.  MUSCULOSKELETAL:  Spine: normal Joints: intact  NEUROLOGIC: oriented to time,place and person; nonfocal. Strength is normal Sensory is normal Reflexes are normal Cranial Nerves are normal.  ASSESSMENT: HTN (hypertension) - Plan: metoprolol (LOPRESSOR) 50 MG tablet, metoprolol succinate (TOPROL-XL) 100 MG 24 hr tablet, furosemide (LASIX) 40 MG tablet, amLODipine (NORVASC) 10 MG tablet, benazepril (LOTENSIN) 20 MG tablet, BASIC METABOLIC PANEL WITH GFR  HLD (hyperlipidemia) - Plan: niacin (NIASPAN) 1000 MG CR tablet, pravastatin (PRAVACHOL) 40 MG tablet, Hepatic function panel, NMR Lipoprofile with Lipids  Type II or unspecified type diabetes mellitus without mention of complication, not stated as uncontrolled - Plan: metFORMIN (GLUCOPHAGE) 500 MG tablet, POCT glycosylated hemoglobin (Hb A1C), POCT UA - Microalbumin, Microalbumin, urine  Gout - Plan: Uric acid, febuxostat (ULORIC) 40 MG tablet, DISCONTINUED: febuxostat (ULORIC) 40 MG tablet, DISCONTINUED: febuxostat (ULORIC) 40 MG tablet, DISCONTINUED: febuxostat (ULORIC) 40 MG tablet  Obesity, unspecified  BPH (benign prostatic hyperplasia) - Plan: finasteride (PROSCAR) 5 MG tablet   PLAN:  Orders Placed This Encounter  Procedures  . Hepatic function panel  . BASIC METABOLIC PANEL WITH GFR  . NMR Lipoprofile with Lipids  . Uric acid  . Microalbumin, urine  . POCT glycosylated hemoglobin (Hb A1C)  . POCT UA - Microalbumin   Results for orders placed in visit on 04/29/13  POCT GLYCOSYLATED HEMOGLOBIN (HGB A1C)      Result Value Range   Hemoglobin A1C 5.8%    POCT UA - MICROALBUMIN      Result Value Range   Microalbumin Ur, POC 50mg /l     Meds ordered this encounter  Medications  . niacin (NIASPAN) 1000 MG CR  tablet    Sig: Take 1 tablet (1,000 mg total) by mouth at bedtime.    Dispense:  30 tablet    Refill:   11  . DISCONTD: febuxostat (ULORIC) 40 MG tablet    Sig: Take 2 tablets (80 mg total) by mouth daily.    Dispense:  30 tablet    Refill:  11    Lot [id number] Exp dec 2017 Number 14 samples given  . metoprolol (LOPRESSOR) 50 MG tablet    Sig: Take 1 tablet (50 mg total) by mouth daily.    Dispense:  30 tablet    Refill:  11  . metFORMIN (GLUCOPHAGE) 500 MG tablet    Sig: Take 1 tablet (500 mg total) by mouth 2 (two) times daily with a meal.    Dispense:  60 tablet    Refill:  11  . metoprolol succinate (TOPROL-XL) 100 MG 24 hr tablet    Sig: Take 1 tablet (100 mg total) by mouth 1 day or 1 dose.    Dispense:  30 tablet    Refill:  11  . furosemide (LASIX) 40 MG tablet    Sig: Take 1 tablet (40 mg total) by mouth 2 (two) times daily.    Dispense:  30 tablet    Refill:  11  . finasteride (PROSCAR) 5 MG tablet    Sig: Take 1 tablet (5 mg total) by mouth daily.    Dispense:  30 tablet    Refill:  11  . amLODipine (NORVASC) 10 MG tablet    Sig: Take 1 tablet (10 mg total) by mouth daily.    Dispense:  30 tablet    Refill:  11  . benazepril (LOTENSIN) 20 MG tablet    Sig: Take 1 tablet (20 mg total) by mouth daily.    Dispense:  30 tablet    Refill:  5  . pravastatin (PRAVACHOL) 40 MG tablet    Sig: Take 1 tablet (40 mg total) by mouth daily.    Dispense:  30 tablet    Refill:  11  . DISCONTD: febuxostat (ULORIC) 40 MG tablet    Sig: Take 2 tablets (80 mg total) by mouth daily.    Dispense:  30 tablet    Refill:  11  . DISCONTD: febuxostat (ULORIC) 40 MG tablet    Sig: Take 1 tablet (40 mg total) by mouth daily.    Dispense:  30 tablet    Refill:  11  . febuxostat (ULORIC) 40 MG tablet    Sig: Take 1 tablet (40 mg total) by mouth daily.    Dispense:  30 tablet    Refill:  11   Patient's benazepril was restarted. Cause for d/c before is uncertain. Not found in chart. His Bp was elevated.He needs tiration of meds.  RTC 4 weeks to recheck BP.  Bertran Zeimet P. Modesto Charon,  M.D.

## 2013-04-30 LAB — MICROALBUMIN, URINE: Microalb, Ur: 3.5 mg/dL — ABNORMAL HIGH (ref 0.00–1.89)

## 2013-05-02 LAB — NMR LIPOPROFILE WITH LIPIDS
Cholesterol, Total: 111 mg/dL (ref <200)
HDL Particle Number: 31.4 umol/L (ref >=30.5)
HDL Size: 8.6 nm — ABNORMAL LOW (ref >=9.2)
HDL-C: 31 mg/dL — ABNORMAL LOW (ref >=40)
LDL (calc): 33 mg/dL (ref <100)
LDL Particle Number: 475 nmol/L (ref <1000)
LDL Size: 19.5 nm — ABNORMAL LOW (ref >20.5)
LP-IR Score: 79 — ABNORMAL HIGH (ref <=45)
Large HDL-P: <1.3 umol/L — ABNORMAL LOW (ref >=4.8)
Large VLDL-P: 10.8 nmol/L — ABNORMAL HIGH (ref <=2.7)
Small LDL Particle Number: 287 nmol/L (ref <=527)
Triglycerides: 236 mg/dL — ABNORMAL HIGH (ref <150)
VLDL Size: 50.7 nm — ABNORMAL HIGH (ref <=46.6)

## 2013-05-10 ENCOUNTER — Emergency Department: Payer: Self-pay

## 2013-05-10 ENCOUNTER — Emergency Department
Admission: EM | Admit: 2013-05-10 | Discharge: 2013-05-10 | Disposition: A | Discharge status: Home / Self Care | Payer: Self-pay | Attending: Emergency Medicine | Admitting: Emergency Medicine

## 2013-05-10 DIAGNOSIS — R339 Retention of urine, unspecified: Secondary | ICD-10-CM | POA: Insufficient documentation

## 2013-05-10 DIAGNOSIS — N390 Urinary tract infection, site not specified: Secondary | ICD-10-CM | POA: Insufficient documentation

## 2013-05-10 LAB — CBC AND DIFFERENTIAL
Basophils %: 0.4 % (ref 0.0–3.0)
Basophils Absolute: 0.1 10*3/uL (ref 0.0–0.3)
Eosinophils %: 0 % (ref 0.0–7.0)
Eosinophils Absolute: 0 10*3/uL (ref 0.0–0.8)
Hematocrit: 35.8 % — ABNORMAL LOW (ref 39.0–52.5)
Hemoglobin: 12.9 gm/dL — ABNORMAL LOW (ref 13.0–17.5)
Lymphocytes Absolute: 0.7 10*3/uL (ref 0.6–5.1)
Lymphocytes: 3.5 % — ABNORMAL LOW (ref 15.0–46.0)
MCH: 31 pg (ref 28–35)
MCHC: 36 gm/dL (ref 31–36)
MCV: 87 fL (ref 80–100)
MPV: 5.6 fL — ABNORMAL LOW (ref 6.0–10.0)
Monocytes Absolute: 0.8 10*3/uL (ref 0.1–1.7)
Monocytes: 4 % (ref 3.0–15.0)
Neutrophils %: 92.1 % — ABNORMAL HIGH (ref 42.0–78.0)
Neutrophils Absolute: 18.3 10*3/uL — ABNORMAL HIGH (ref 1.7–8.6)
PLT CT: 325 10*3/uL (ref 130–440)
RBC: 4.13 10*6/uL (ref 4.00–5.70)
RDW: 10.9 % (ref 10.5–14.5)
WBC: 19.9 10*3/uL — ABNORMAL HIGH (ref 4.00–11.00)

## 2013-05-10 LAB — BASIC METABOLIC PANEL
Anion Gap: 18.4 mMol/L — ABNORMAL HIGH (ref 7.0–18.0)
BUN / Creatinine Ratio: 14.8 Ratio (ref 10.0–30.0)
BUN: 17 mg/dL (ref 6–20)
CO2: 19 mMol/L — ABNORMAL LOW (ref 20.0–30.0)
Calcium: 8.9 mg/dL (ref 8.8–10.2)
Chloride: 108 mMol/L (ref 98–112)
Creatinine: 1.15 mg/dL (ref 0.72–1.25)
EGFR: >60 mL/min/{1.73_m2}
Glucose: 111 mg/dL — ABNORMAL HIGH (ref 70–99)
Osmolality Calc: 285 mOsm/kg (ref 275–300)
Potassium: 3.4 mMol/L — ABNORMAL LOW (ref 3.5–5.3)
Sodium: 142 mMol/L (ref 136–145)

## 2013-05-10 LAB — URINALYSIS WITH CULTURE REFLEX
Bilirubin, UA: NEGATIVE mg/dL
Glucose, UA: NEGATIVE mg/dL
Ketones UA: 15 mg/dL — AB
Nitrite, UA: POSITIVE — AB
Urine Protein: 100 mg/dL — AB
Urine Specific Gravity: 1.01 (ref 1.001–1.040)
Urobilinogen, UA: 0.2 mg/dL — AB
pH, Urine: 6.5 pH (ref 5.0–8.0)

## 2013-05-10 MED ORDER — CIPROFLOXACIN HCL 500 MG PO TABS
ORAL_TABLET | ORAL | Status: AC
Start: 2013-05-10 — End: ?
  Filled 2013-05-10: qty 1

## 2013-05-10 MED ORDER — CIPROFLOXACIN HCL 500 MG PO TABS
500.0000 mg | ORAL_TABLET | Freq: Once | ORAL | Status: AC
Start: 2013-05-10 — End: 2013-05-10
  Administered 2013-05-10: 500 mg via ORAL

## 2013-05-10 MED ORDER — CIPROFLOXACIN HCL 500 MG PO TABS
500.0000 mg | ORAL_TABLET | Freq: Two times a day (BID) | ORAL | Status: AC
Start: 2013-05-10 — End: 2013-05-20

## 2013-05-10 MED ORDER — TAMSULOSIN HCL 0.4 MG PO CAPS
0.4000 mg | ORAL_CAPSULE | Freq: Every evening | ORAL | Status: AC
Start: 2013-05-10 — End: ?

## 2013-05-10 NOTE — ED Provider Notes (Signed)
Physician/Midlevel provider first contact with patient: 05/10/13 1429         History     Chief Complaint   Patient presents with   . Urinary Retention     Patient unable to urinate for 3 days. Now with severe suprapubic pain    Location: GU  Quality:urinary retention  Severity:severe  Onset:3 days ago  Duration:since onset  Modifying Factors:none  Associated Signs and symptoms:no fever          History reviewed. No pertinent past medical history.    History reviewed. No pertinent past surgical history.    Family History   Problem Relation Age of Onset   . Heart disease Mother    . Heart disease Father        Social  History   Substance Use Topics   . Smoking status: Former Smoker     Quit date: 05/10/2004   . Smokeless tobacco: Never Used   . Alcohol Use: No       .     No Known Allergies    Current/Home Medications    No medications on file        Review of Systems   Genitourinary: Positive for decreased urine volume and difficulty urinating. Negative for flank pain, scrotal swelling, penile pain and testicular pain.   All other systems reviewed and are negative.        Physical Exam    BP 148/62  Pulse 72  Temp 98.4 F (36.9 C)  Resp 18  Ht 1.803 m  Wt 74.844 kg  BMI 23.02 kg/m2  SpO2 100%    Physical Exam   Nursing note and vitals reviewed.  Constitutional: He is oriented to person, place, and time. He appears distressed.   HENT:   Mouth/Throat: Oropharynx is clear and moist.   Eyes: EOM are normal. Pupils are equal, round, and reactive to light.   Neck: Normal range of motion. Neck supple.   Cardiovascular: Normal rate and regular rhythm.    Pulmonary/Chest: Effort normal and breath sounds normal.   Abdominal: Soft. Bowel sounds are normal. He exhibits distension. There is tenderness.        suprapubic distention   Musculoskeletal: Normal range of motion.   Neurological: He is alert and oriented to person, place, and time.   Skin: Skin is warm and dry.   Psychiatric: He has a normal mood and affect.        MDM and ED Course     ED Medication Orders      Start     Status Ordering Provider    05/10/13 1524   ciprofloxacin (CIPRO) tablet 500 mg   Once in ED      Route: Oral  Ordered Dose: 500 mg         Last MAR action:  Given Milanna Kozlov H                 MDM  Number of Diagnoses or Management Options  Infection of urinary tract:   Urinary retention:   Diagnosis management comments: Clearly urinary retention on exam. Immediate relief with foley.     Re evaluation 1950 out with foley    Procedures    Clinical Impression & Disposition     Clinical Impression  Final diagnoses:   Urinary retention   Infection of urinary tract        ED Disposition     Discharge Marijean Niemann discharge to home/self care.  Condition at discharge: Stable             New Prescriptions    CIPROFLOXACIN (CIPRO) 500 MG TABLET    Take 1 tablet (500 mg total) by mouth 2 (two) times daily.    TAMSULOSIN (FLOMAX) 0.4 MG CAPS    Take 1 capsule (0.4 mg total) by mouth nightly. Take until stone passes               Nicholas Lose, MD  05/10/13 [phone]

## 2013-05-10 NOTE — ED Notes (Signed)
Pt states he has not voided x 3 days.  He states he was in bed "knocked out" for 48 hours. Pt woke up yesterday and was feeling better today. PT states he doesn't know last time he voided.

## 2013-05-10 NOTE — Discharge Instructions (Signed)
Bladder Infection,Male (Adult)    A bladder infection ("cystitis" or "UTI") usually causes a constant urge to urinate, and a burning when passing urine. Urine may be cloudy, smelly or dark. There may be also be pain in the lower abdomen.  Cystitis in males is not common. It may be caused by a partial blockage in the urinary system that keeps the bladder from emptying completely. This is most often related to an enlarged prostate gland.  Home Care:   Drink lots of fluids (at least 6-8 glasses a day). This will flush the bacteria out of your bladder.   Avoid sexual intercourse until your symptoms are gone.   Avoid caffeine, alcohol, and spicy foods. They could irritate the bladder.   A bladder infection is treated with antibiotics. You may also be given Pyridium (generic - phenazopyridine) to reduce burning with urination. This will cause urine to become a bright orange color, which can stain clothing.  Follow Up  with your doctor or this facility if ALL symptoms have not cleared within five days. It is important to keep your follow up appointment to discuss with your doctor the need for further tests of the urinary tract.  Get Prompt Medical Attention  if any of the following occur:   Fever of 100.4F (38C) or higher, or as directed by your healthcare provider   No improvement by the third day of treatment   Increasing back or abdominal pain   Repeated vomiting; unable to keep medicine down   Weakness, dizziness or fainting   2000-2014 [employer], [address], [place] [address]. All rights reserved. This information is not intended as a substitute for professional medical care. Always follow your healthcare professional's instructions.      Bladder Infection,Male (Adult)    A bladder infection ("cystitis" or "UTI") usually causes a constant urge to urinate, and a burning when passing urine. Urine may be cloudy, smelly or dark. There may be also be pain in the lower abdomen.  Cystitis in  males is not common. It may be caused by a partial blockage in the urinary system that keeps the bladder from emptying completely. This is most often related to an enlarged prostate gland.  Home Care:   Drink lots of fluids (at least 6-8 glasses a day). This will flush the bacteria out of your bladder.   Avoid sexual intercourse until your symptoms are gone.   Avoid caffeine, alcohol, and spicy foods. They could irritate the bladder.   A bladder infection is treated with antibiotics. You may also be given Pyridium (generic - phenazopyridine) to reduce burning with urination. This will cause urine to become a bright orange color, which can stain clothing.  Follow Up  with your doctor or this facility if ALL symptoms have not cleared within five days. It is important to keep your follow up appointment to discuss with your doctor the need for further tests of the urinary tract.  Get Prompt Medical Attention  if any of the following occur:   Fever of 100.4F (38C) or higher, or as directed by your healthcare provider   No improvement by the third day of treatment   Increasing back or abdominal pain   Repeated vomiting; unable to keep medicine down   Weakness, dizziness or fainting   2000-2014 [employer], [address], [place] [address]. All rights reserved. This information is not intended as a substitute for professional medical care. Always follow your healthcare professional's instructions.

## 2013-05-26 ENCOUNTER — Encounter: Payer: Self-pay | Admitting: Family Medicine

## 2013-05-26 ENCOUNTER — Ambulatory Visit (INDEPENDENT_AMBULATORY_CARE_PROVIDER_SITE_OTHER): Payer: Medicare Other | Admitting: Family Medicine

## 2013-05-26 VITALS — BP 164/68 | HR 52 | Temp 98.4°F | Wt 185.0 lb

## 2013-05-26 DIAGNOSIS — N400 Enlarged prostate without lower urinary tract symptoms: Secondary | ICD-10-CM

## 2013-05-26 DIAGNOSIS — E119 Type 2 diabetes mellitus without complications: Secondary | ICD-10-CM

## 2013-05-26 DIAGNOSIS — M109 Gout, unspecified: Secondary | ICD-10-CM

## 2013-05-26 DIAGNOSIS — E785 Hyperlipidemia, unspecified: Secondary | ICD-10-CM

## 2013-05-26 DIAGNOSIS — E669 Obesity, unspecified: Secondary | ICD-10-CM

## 2013-05-26 DIAGNOSIS — I10 Essential (primary) hypertension: Secondary | ICD-10-CM

## 2013-05-26 MED ORDER — FEBUXOSTAT 40 MG PO TABS: 80.0000 mg | ORAL_TABLET | Freq: Every day | ORAL | Status: DC

## 2013-05-26 NOTE — Patient Instructions (Addendum)
      Dr Jamari Diana's Recommendations  Diet and Exercise discussed with patient.  For nutrition information, I recommend books:  1).Eat to Live by Dr Joel Fuhrman. 2).Prevent and Reverse Heart Disease by Dr Caldwell Esselstyn. 3) Dr Neal Barnard's Book:  Program to Reverse Diabetes  Exercise recommendations are:  If unable to walk, then the patient can exercise in a chair 3 times a day. By flapping arms like a bird gently and raising legs outwards to the front.  If ambulatory, the patient can go for walks for 30 minutes 3 times a week. Then increase the intensity and duration as tolerated.  Goal is to try to attain exercise frequency to 5 times a week.  If applicable: Best to perform resistance exercises (machines or weights) 2 days a week and cardio type exercises 3 days per week.  

## 2013-05-26 NOTE — Progress Notes (Signed)
Patient ID: Alan [name], male   DOB: 12/16/1940, 72 y.o.   MRN: [id number] SUBJECTIVE: CC: Chief Complaint  Patient presents with  . Follow-up    reck bp   HPI: Patient is here for follow up of hyperlipidemia/diabetes/hyperlipidemia and Gout: Really here to recheck his BP denies Headache;denies Chest Pain;denies weakness;denies Shortness of Breath and orthopnea;denies Visual changes;denies palpitations;denies cough;denies pedal edema;denies symptoms of TIA or stroke;deniesClaudication symptoms. admits to Compliance with medications; denies Problems with medications.    PMH/PSH: reviewed/updated in Epic  SH/FH: reviewed/updated in Epic  Allergies: reviewed/updated in Epic  Medications: reviewed/updated in Epic  Immunizations: reviewed/updated in Epic  ROS: As above in the HPI. All other systems are stable or negative.  OBJECTIVE: APPEARANCE:  Patient in no acute distress.The patient appeared well nourished and normally developed. Acyanotic. Waist: VITAL SIGNS:BP 164/68  Pulse 52  Temp(Src) 98.4 F (36.9 C) (Oral)  Wt 185 lb (83.915 kg)  BMI 29.87 kg/m2 AAM Recheck BP 125/60  SKIN: warm and  Dry without overt rashes, tattoos and scars  HEAD and Neck: without JVD, Head and scalp: normal Eyes:No scleral icterus. Fundi normal, eye movements normal. Ears: Auricle normal, canal normal, Tympanic membranes normal, insufflation normal. Nose: normal Throat: normal Neck & thyroid: normal  CHEST & LUNGS: Chest wall: normal Lungs: Clear  CVS: Reveals the PMI to be normally located. Regular rhythm, First and Second Heart sounds are normal,  absence of murmurs, rubs or gallops. Peripheral vasculature: Radial pulses: normal Dorsal pedis pulses: normal Posterior pulses: normal  ABDOMEN:  Appearance:Obese , no masses, no Abdominal Aortic enlargement. No Guarding , no rebound. No Bruits. Bowel sounds: normal  RECTAL: N/A GU: N/A  EXTREMETIES: nonedematous. Both  Femoral and Pedal pulses are normal.  MUSCULOSKELETAL:  Spine: normal Joints: intact  NEUROLOGIC: oriented to time,place and person; nonfocal. Strength is normal Sensory is normal Reflexes are normal Cranial Nerves are normal. Results for orders placed in visit on 04/29/13  HEPATIC FUNCTION PANEL      Result Value Range   Total Bilirubin 0.9  0.3 - 1.2 mg/dL   Bilirubin, Direct 0.2  0.0 - 0.3 mg/dL   Indirect Bilirubin 0.7  0.0 - 0.9 mg/dL   Alkaline Phosphatase 51  39 - 117 U/L   AST 22  0 - 37 U/L   ALT 22  0 - 53 U/L   Total Protein 7.7  6.0 - 8.3 g/dL   Albumin 4.5  3.5 - 5.2 g/dL  BASIC METABOLIC PANEL WITH GFR      Result Value Range   Sodium 140  135 - 145 mEq/L   Potassium 3.9  3.5 - 5.3 mEq/L   Chloride 98  96 - 112 mEq/L   CO2 30  19 - 32 mEq/L   Glucose, Bld 114 (*) 70 - 99 mg/dL   BUN 11  6 - 23 mg/dL   Creat 5.28  4.13 - 2.44 mg/dL   Calcium 01.0  8.4 - 27.2 mg/dL   GFR, Est African American 82     GFR, Est Non African American 71    NMR LIPOPROFILE WITH LIPIDS      Result Value Range   LDL Particle Number 475  <1000 nmol/L   LDL (calc) 33  <100 mg/dL   HDL-C 31 (*) >=53 mg/dL   Triglycerides 664 (*) <150 mg/dL   Cholesterol, Total 403  <200 mg/dL   HDL Particle Number [id number]  >=25.9 umol/L   Large HDL-P <1.3 (*) >=4.8 umol/L  Large VLDL-P 10.8 (*) <=2.7 nmol/L   Small LDL Particle Number 287  <=527 nmol/L   LDL Size 19.5 (*) >20.5 nm   HDL Size 8.6 (*) >=9.2 nm   VLDL Size 50.7 (*) <=46.6 nm   LP-IR Score 79 (*) <=45  URIC ACID      Result Value Range   Uric Acid, Serum 7.1 (*) 4.0 - 6.0 mg/dL  MICROALBUMIN, URINE      Result Value Range   Microalb, Ur 3.50 (*) 0.00 - 1.89 mg/dL  POCT GLYCOSYLATED HEMOGLOBIN (HGB A1C)      Result Value Range   Hemoglobin A1C 5.8%    POCT UA - MICROALBUMIN      Result Value Range   Microalbumin Ur, POC 50mg /l      ASSESSMENT: HTN (hypertension)  Gout - Plan: febuxostat (ULORIC) 40 MG tablet  Type II or  unspecified type diabetes mellitus without mention of complication, not stated as uncontrolled  Obesity, unspecified  HLD (hyperlipidemia)  BPH (benign prostatic hyperplasia)  Reviewed labs with patient. PLAN:  No orders of the defined types were placed in this encounter.   Meds ordered this encounter  Medications  . DISCONTD: finasteride (PROSCAR) 5 MG tablet    Sig:   . febuxostat (ULORIC) 40 MG tablet    Sig: Take 2 tablets (80 mg total) by mouth daily.    Dispense:  60 tablet    Refill:  11   Increased the uloric.  Diet exercise discussed with patient.       Dr Woodroe Mode Recommendations  Diet and Exercise discussed with patient.  For nutrition information, I recommend books:  1).Eat to Live by Dr Monico Hoar. 2).Prevent and Reverse Heart Disease by Dr Suzzette Righter. 3) Dr Katherina Right Book:  Program to Reverse Diabetes  Exercise recommendations are:  If unable to walk, then the patient can exercise in a chair 3 times a day. By flapping arms like a bird gently and raising legs outwards to the front.  If ambulatory, the patient can go for walks for 30 minutes 3 times a week. Then increase the intensity and duration as tolerated.  Goal is to try to attain exercise frequency to 5 times a week.  If applicable: Best to perform resistance exercises (machines or weights) 2 days a week and cardio type exercises 3 days per week.  Return in about 2 months (around 07/26/2013) for recheck BP, Recheck medical problems.  Quandarius Nill P. [place] Charon, M.D.

## 2013-06-17 ENCOUNTER — Ambulatory Visit (INDEPENDENT_AMBULATORY_CARE_PROVIDER_SITE_OTHER): Payer: Medicare Other | Admitting: Urology

## 2013-06-17 DIAGNOSIS — N400 Enlarged prostate without lower urinary tract symptoms: Secondary | ICD-10-CM

## 2013-06-17 DIAGNOSIS — C61 Malignant neoplasm of prostate: Secondary | ICD-10-CM

## 2013-07-08 ENCOUNTER — Telehealth: Payer: Self-pay | Admitting: Family Medicine

## 2013-07-08 NOTE — Telephone Encounter (Signed)
Pt notified of below message and verbalized understanding.

## 2013-07-08 NOTE — Telephone Encounter (Signed)
No other treatments left due to allergy and if he  Doesn't take it he will have more gout attacks, joint destruction and also effects on Kidneys that leads to kidney failure in the long run.

## 2013-07-13 ENCOUNTER — Encounter (HOSPITAL_COMMUNITY): Payer: Self-pay | Admitting: Emergency Medicine

## 2013-07-13 ENCOUNTER — Emergency Department (HOSPITAL_COMMUNITY)
Admission: EM | Admit: 2013-07-13 | Discharge: 2013-07-13 | Disposition: A | Discharge status: Home / Self Care | Payer: Medicare Other | Attending: Emergency Medicine | Admitting: Emergency Medicine

## 2013-07-13 DIAGNOSIS — R079 Chest pain, unspecified: Secondary | ICD-10-CM | POA: Insufficient documentation

## 2013-07-13 DIAGNOSIS — Z860100 Personal history of colon polyps, unspecified: Secondary | ICD-10-CM | POA: Insufficient documentation

## 2013-07-13 DIAGNOSIS — R011 Cardiac murmur, unspecified: Secondary | ICD-10-CM | POA: Insufficient documentation

## 2013-07-13 DIAGNOSIS — E119 Type 2 diabetes mellitus without complications: Secondary | ICD-10-CM | POA: Insufficient documentation

## 2013-07-13 DIAGNOSIS — Z8639 Personal history of other endocrine, nutritional and metabolic disease: Secondary | ICD-10-CM | POA: Insufficient documentation

## 2013-07-13 DIAGNOSIS — Z8546 Personal history of malignant neoplasm of prostate: Secondary | ICD-10-CM | POA: Insufficient documentation

## 2013-07-13 DIAGNOSIS — E785 Hyperlipidemia, unspecified: Secondary | ICD-10-CM | POA: Insufficient documentation

## 2013-07-13 DIAGNOSIS — I10 Essential (primary) hypertension: Secondary | ICD-10-CM | POA: Insufficient documentation

## 2013-07-13 DIAGNOSIS — K219 Gastro-esophageal reflux disease without esophagitis: Secondary | ICD-10-CM | POA: Insufficient documentation

## 2013-07-13 DIAGNOSIS — R112 Nausea with vomiting, unspecified: Secondary | ICD-10-CM | POA: Insufficient documentation

## 2013-07-13 DIAGNOSIS — Z87891 Personal history of nicotine dependence: Secondary | ICD-10-CM | POA: Insufficient documentation

## 2013-07-13 DIAGNOSIS — E871 Hypo-osmolality and hyponatremia: Secondary | ICD-10-CM | POA: Insufficient documentation

## 2013-07-13 DIAGNOSIS — Z862 Personal history of diseases of the blood and blood-forming organs and certain disorders involving the immune mechanism: Secondary | ICD-10-CM | POA: Insufficient documentation

## 2013-07-13 DIAGNOSIS — Z87448 Personal history of other diseases of urinary system: Secondary | ICD-10-CM | POA: Insufficient documentation

## 2013-07-13 LAB — TROPONIN I: Troponin I: <0.3 ng/mL (ref <0.30)

## 2013-07-13 LAB — COMPREHENSIVE METABOLIC PANEL
ALT: 17 U/L (ref 0–53)
AST: 23 U/L (ref 0–37)
Albumin: 4.1 g/dL (ref 3.5–5.2)
Alkaline Phosphatase: 56 U/L (ref 39–117)
Calcium: 10.4 mg/dL (ref 8.4–10.5)
Potassium: 3.5 mEq/L (ref 3.5–5.1)
Sodium: 134 mEq/L — ABNORMAL LOW (ref 135–145)
Total Protein: 8.2 g/dL (ref 6.0–8.3)

## 2013-07-13 LAB — CBC WITH DIFFERENTIAL/PLATELET
Basophils Absolute: 0 10*3/uL (ref 0.0–0.1)
Basophils Relative: 0 % (ref 0–1)
Eosinophils Absolute: 0.1 10*3/uL (ref 0.0–0.7)
Lymphs Abs: 1.1 10*3/uL (ref 0.7–4.0)
MCH: 32.4 pg (ref 26.0–34.0)
MCHC: 35.7 g/dL (ref 30.0–36.0)
Neutrophils Relative %: 83 % — ABNORMAL HIGH (ref 43–77)
Platelets: 252 10*3/uL (ref 150–400)
RBC: 4.41 MIL/uL (ref 4.22–5.81)
RDW: 12.9 % (ref 11.5–15.5)

## 2013-07-13 MED ORDER — GI COCKTAIL ~~LOC~~
30.0000 mL | Freq: Once | ORAL | Status: AC
  Administered 2013-07-13: 30 mL via ORAL
  Filled 2013-07-13: qty 30

## 2013-07-13 MED ORDER — PANTOPRAZOLE SODIUM 40 MG PO TBEC: 40.0000 mg | DELAYED_RELEASE_TABLET | Freq: Every day | ORAL | Status: DC

## 2013-07-13 MED ORDER — PANTOPRAZOLE SODIUM 40 MG PO TBEC
40.0000 mg | DELAYED_RELEASE_TABLET | Freq: Once | ORAL | Status: AC
  Administered 2013-07-13: 40 mg via ORAL
  Filled 2013-07-13: qty 1

## 2013-07-13 NOTE — ED Notes (Signed)
Pt reporting pain completely gone.

## 2013-07-13 NOTE — ED Notes (Signed)
Pt c/o upper abdominal pain. States that after eating a yogurt earlier, he felt nauseous and began dry heaving. Reports coughing up some phlegm but no episodes of emesis. Denies nausea at present, only c/o upper abdominal pain.

## 2013-07-13 NOTE — ED Provider Notes (Signed)
CSN: [id number]     Arrival date & time 07/13/13  0043 History     First MD Initiated Contact with Patient 07/13/13 0055     Chief Complaint  Patient presents with  . Nausea  . Abdominal Pain   (Consider location/radiation/quality/duration/timing/severity/associated sxs/prior Treatment) Patient is a 72 y.o. male presenting with abdominal pain. The history is provided by the patient.  Abdominal Pain Associated symptoms include abdominal pain.  He had onset at about 10:30 PM of severe epigastric pain which radiated through to the back. This occurred about 30 minutes after eating yogurt. There was associated nausea and vomiting. He did not feel any better after vomiting. He no longer has nausea. Pain was initially rated at 9/10 and is down to 7/10. He denies constipation or diarrhea. Denies fever, chills, sweats. He has not had pain like this before.  Past Medical History  Diagnosis Date  . Hypertension   . Heart murmur   . ED (erectile dysfunction)   . Agranulocytosis   . Diabetes mellitus   . Hyperlipidemia   . Gout   . Prostate cancer     Followed by Dr. Annabell Howells   . Adenomatous colon polyp    Past Surgical History  Procedure Laterality Date  . Colonoscopy    . Sigmoidoscopy     Family History  Problem Relation Age of Onset  . Stroke Mother    History  Substance Use Topics  . Smoking status: Former Games developer  . Smokeless tobacco: Never Used  . Alcohol Use: No    Review of Systems  Gastrointestinal: Positive for abdominal pain.  All other systems reviewed and are negative.    Allergies  Allopurinol and Colchicine  Home Medications   Current Outpatient Rx  Name  Route  Sig  Dispense  Refill  . amLODipine (NORVASC) 10 MG tablet   Oral   Take 1 tablet (10 mg total) by mouth daily.   30 tablet   11   . aspirin (BAYER ASPIRIN) 325 MG tablet   Oral   Take 325 mg by mouth every morning.           . benazepril (LOTENSIN) 20 MG tablet   Oral   Take 1 tablet (20  mg total) by mouth daily.   30 tablet   5   . febuxostat (ULORIC) 40 MG tablet   Oral   Take 2 tablets (80 mg total) by mouth daily.   60 tablet   11   . finasteride (PROSCAR) 5 MG tablet   Oral   Take 1 tablet (5 mg total) by mouth daily.   30 tablet   11   . furosemide (LASIX) 40 MG tablet   Oral   Take 1 tablet (40 mg total) by mouth 2 (two) times daily.   30 tablet   11   . metFORMIN (GLUCOPHAGE) 500 MG tablet   Oral   Take 1 tablet (500 mg total) by mouth 2 (two) times daily with a meal.   60 tablet   11   . metoprolol (LOPRESSOR) 50 MG tablet   Oral   Take 1 tablet (50 mg total) by mouth daily.   30 tablet   11   . metoprolol succinate (TOPROL-XL) 100 MG 24 hr tablet   Oral   Take 1 tablet (100 mg total) by mouth 1 day or 1 dose.   30 tablet   11   . niacin (NIASPAN) 1000 MG CR tablet   Oral  Take 1 tablet (1,000 mg total) by mouth at bedtime.   30 tablet   11   . pravastatin (PRAVACHOL) 40 MG tablet   Oral   Take 1 tablet (40 mg total) by mouth daily.   30 tablet   11    BP 219/72  Pulse 60  Temp(Src) 97.8 F (36.6 C) (Oral)  Resp 18  Ht 5\' 5"  (1.651 m)  Wt 189 lb (85.73 kg)  BMI 31.45 kg/m2  SpO2 98% Physical Exam  Nursing note and vitals reviewed.   ED Course  72 year old male, resting comfortably and in no acute distress. Vital signs are significant for hypertension with blood pressure 219/72. Oxygen saturation is 98%, which is normal. Head is normocephalic and atraumatic. PERRLA, EOMI. Oropharynx is clear. Neck is nontender and supple without adenopathy or JVD. Back is nontender and there is no CVA tenderness. Lungs are clear without rales, wheezes, or rhonchi. Chest is nontender. Heart has regular rate and rhythm without murmur. Abdomen is soft, flat, with mild epigastric tenderness. There is no rebound or guarding. There is a negative Murphy's sign. There are no masses or hepatosplenomegaly and peristalsis is  normoactive. Extremities have no cyanosis or edema, full range of motion is present. Skin is warm and dry without rash. Neurologic: Mental status is normal, cranial nerves are intact, there are no motor or sensory deficits.  Procedures (including critical care time)  Results for orders placed during the hospital encounter of 07/13/13  CBC WITH DIFFERENTIAL      Result Value Range   WBC 9.4  4.0 - 10.5 K/uL   RBC 4.41  4.22 - 5.81 MIL/uL   Hemoglobin 14.3  13.0 - 17.0 g/dL   HCT 96.0  45.4 - 09.8 %   MCV 90.9  78.0 - 100.0 fL   MCH 32.4  26.0 - 34.0 pg   MCHC 35.7  30.0 - 36.0 g/dL   RDW 11.9  14.7 - 82.9 %   Platelets 252  150 - 400 K/uL   Neutrophils Relative % 83 (*) 43 - 77 %   Neutro Abs 7.8 (*) 1.7 - 7.7 K/uL   Lymphocytes Relative 11 (*) 12 - 46 %   Lymphs Abs 1.1  0.7 - 4.0 K/uL   Monocytes Relative 5  3 - 12 %   Monocytes Absolute 0.4  0.1 - 1.0 K/uL   Eosinophils Relative 1  0 - 5 %   Eosinophils Absolute 0.1  0.0 - 0.7 K/uL   Basophils Relative 0  0 - 1 %   Basophils Absolute 0.0  0.0 - 0.1 K/uL  COMPREHENSIVE METABOLIC PANEL      Result Value Range   Sodium 134 (*) 135 - 145 mEq/L   Potassium 3.5  3.5 - 5.1 mEq/L   Chloride 93 (*) 96 - 112 mEq/L   CO2 29  19 - 32 mEq/L   Glucose, Bld 144 (*) 70 - 99 mg/dL   BUN 19  6 - 23 mg/dL   Creatinine, Ser 5.62  0.50 - 1.35 mg/dL   Calcium 13.0  8.4 - 86.5 mg/dL   Total Protein 8.2  6.0 - 8.3 g/dL   Albumin 4.1  3.5 - 5.2 g/dL   AST 23  0 - 37 U/L   ALT 17  0 - 53 U/L   Alkaline Phosphatase 56  39 - 117 U/L   Total Bilirubin 0.8  0.3 - 1.2 mg/dL   GFR calc non Af Amer 72 (*) >  90 mL/min   GFR calc Af Amer 84 (*) >90 mL/min  LIPASE, BLOOD      Result Value Range   Lipase 50  11 - 59 U/L  TROPONIN I      Result Value Range   Troponin I <0.30  <0.30 ng/mL  TROPONIN I      Result Value Range   Troponin I <0.30  <0.30 ng/mL    Date: 07/13/2013  Rate: 55  Rhythm: sinus bradycardia  QRS Axis: normal  Intervals:  normal  ST/T Wave abnormalities: T wave inversions in lateral and anterolateral leads  Conduction Disutrbances:none  Narrative Interpretation:T wave inversions in lateral and anterolateral leads which may represent ischemia. No prior ECG available for comparison.  Old EKG Reviewed: none available   1. Chest pain   2. GERD (gastroesophageal reflux disease)   3. Hyponatremia     MDM  Epigastric pain and vomiting which most likely is related to gastritis. ECG will be obtained to evaluate possible cardiac etiology. He does not have physical findings to suggest biliary tract disease. He'll be given a therapeutic trial of a GI cocktail. Severe hypertension is noted and blood pressure will need to be rechecked.  2:19 AM He states he is completely pain free. However, he states the pain actually went away before he got the GI cocktail. He will be kept in the ED to repeat a troponin given his ECG changes and lack of prior ECG.  5:16 AM Repeat troponin is 0. He remained pain-free in the ED. He'll be discharged with prescription for pantoprazol and is to followup with PCP. Also, of note, repeat blood pressure is significantly improved.   Dione Booze, MD 07/13/13 [phone]

## 2013-07-29 ENCOUNTER — Encounter: Payer: Self-pay | Admitting: Family Medicine

## 2013-07-29 ENCOUNTER — Ambulatory Visit (INDEPENDENT_AMBULATORY_CARE_PROVIDER_SITE_OTHER): Payer: Medicare Other | Admitting: Family Medicine

## 2013-07-29 VITALS — BP 154/71 | HR 51 | Temp 97.9°F | Wt 177.1 lb

## 2013-07-29 DIAGNOSIS — M109 Gout, unspecified: Secondary | ICD-10-CM

## 2013-07-29 DIAGNOSIS — E785 Hyperlipidemia, unspecified: Secondary | ICD-10-CM

## 2013-07-29 DIAGNOSIS — E119 Type 2 diabetes mellitus without complications: Secondary | ICD-10-CM

## 2013-07-29 DIAGNOSIS — I10 Essential (primary) hypertension: Secondary | ICD-10-CM

## 2013-07-29 LAB — POCT GLYCOSYLATED HEMOGLOBIN (HGB A1C): Hemoglobin A1C: 5.5

## 2013-07-29 MED ORDER — NIACIN ER (ANTIHYPERLIPIDEMIC) 1000 MG PO TBCR: 1000.0000 mg | EXTENDED_RELEASE_TABLET | Freq: Every day | ORAL | Status: DC

## 2013-07-29 MED ORDER — BENAZEPRIL HCL 40 MG PO TABS: 40.0000 mg | ORAL_TABLET | Freq: Every day | ORAL | Status: DC

## 2013-07-29 MED ORDER — PRAVASTATIN SODIUM 40 MG PO TABS: 40.0000 mg | ORAL_TABLET | Freq: Every day | ORAL | Status: DC

## 2013-07-29 MED ORDER — METFORMIN HCL 500 MG PO TABS: 500.0000 mg | ORAL_TABLET | Freq: Two times a day (BID) | ORAL | Status: DC

## 2013-07-29 NOTE — Progress Notes (Signed)
Patient ID: Alan [name], male   DOB: 04-23-1941, 72 y.o.   MRN: [id number] SUBJECTIVE: CC: Chief Complaint  Patient presents with  . Follow-up    2 month follow up c/o acid refllux went to ER @[facility]  was given pantroprazole and doing well   . Medication Refill    refill lneeded on panttprazole,pravastatin,niacin,benazepril,metformoin     HPI: Patient is here for follow up of hyperlipidemia:/HTN/gout/ denies Headache;denies Chest Pain;denies weakness;denies Shortness of Breath and orthopnea;denies Visual changes;denies palpitations;denies cough;denies pedal edema;denies symptoms of TIA or stroke;deniesClaudication symptoms. admits to Compliance with medications; denies Problems with medications.  Patient is here for follow up of Diabetes Mellitus: Symptoms evaluated: Denies Nocturia ,Denies Urinary Frequency , denies Blurred vision ,deniesDizziness,denies.Dysuria,denies paresthesias, denies extremity pain or ulcers.Marland Kitchendenies chest pain. has had an annual eye exam. do check the feet. Does check CBGs. Average CBG: Denies episodes of hypoglycemia. Does have an emergency hypoglycemic plan. admits toCompliance with medications. Denies Problems with medications.  Past Medical History  Diagnosis Date  . Hypertension   . Heart murmur   . ED (erectile dysfunction)   . Agranulocytosis   . Diabetes mellitus   . Hyperlipidemia   . Gout   . Prostate cancer     Followed by Dr. Annabell Howells   . Adenomatous colon polyp    Past Surgical History  Procedure Laterality Date  . Colonoscopy    . Sigmoidoscopy     History   Social History  . Marital Status: Married    Spouse Name: N/A    Number of Children: N/A  . Years of Education: N/A   Occupational History  . Not on file.   Social History Main Topics  . Smoking status: Former Games developer  . Smokeless tobacco: Never Used  . Alcohol Use: No  . Drug Use: No  . Sexual Activity: Not on file   Other Topics Concern  . Not on file   Social  History Narrative  . No narrative on file   Family History  Problem Relation Age of Onset  . Stroke Mother    Current Outpatient Prescriptions on File Prior to Visit  Medication Sig Dispense Refill  . amLODipine (NORVASC) 10 MG tablet Take 1 tablet (10 mg total) by mouth daily.  30 tablet  11  . aspirin (BAYER ASPIRIN) 325 MG tablet Take 325 mg by mouth every morning.        . benazepril (LOTENSIN) 20 MG tablet Take 1 tablet (20 mg total) by mouth daily.  30 tablet  5  . febuxostat (ULORIC) 40 MG tablet Take 2 tablets (80 mg total) by mouth daily.  60 tablet  11  . finasteride (PROSCAR) 5 MG tablet Take 1 tablet (5 mg total) by mouth daily.  30 tablet  11  . furosemide (LASIX) 40 MG tablet Take 1 tablet (40 mg total) by mouth 2 (two) times daily.  30 tablet  11  . metFORMIN (GLUCOPHAGE) 500 MG tablet Take 1 tablet (500 mg total) by mouth 2 (two) times daily with a meal.  60 tablet  11  . metoprolol (LOPRESSOR) 50 MG tablet Take 1 tablet (50 mg total) by mouth daily.  30 tablet  11  . metoprolol succinate (TOPROL-XL) 100 MG 24 hr tablet Take 1 tablet (100 mg total) by mouth 1 day or 1 dose.  30 tablet  11  . niacin (NIASPAN) 1000 MG CR tablet Take 1 tablet (1,000 mg total) by mouth at bedtime.  30 tablet  11  . pantoprazole (  PROTONIX) 40 MG tablet Take 1 tablet (40 mg total) by mouth daily.  20 tablet  0  . pravastatin (PRAVACHOL) 40 MG tablet Take 1 tablet (40 mg total) by mouth daily.  30 tablet  11   No current facility-administered medications on file prior to visit.   Allergies  Allergen Reactions  . Allopurinol Other (See Comments)    Significantly decreased WBC's  . Colchicine Other (See Comments)    Significantly decreased WBC's    There is no immunization history on file for this patient. Prior to Admission medications   Medication Sig Start Date End Date Taking? Authorizing Provider  amLODipine (NORVASC) 10 MG tablet Take 1 tablet (10 mg total) by mouth daily. 04/29/13  Yes  Ileana Ladd, MD  aspirin (BAYER ASPIRIN) 325 MG tablet Take 325 mg by mouth every morning.     Yes Historical Provider, MD  benazepril (LOTENSIN) 20 MG tablet Take 1 tablet (20 mg total) by mouth daily. 04/29/13  Yes Ileana Ladd, MD  febuxostat (ULORIC) 40 MG tablet Take 2 tablets (80 mg total) by mouth daily. 05/26/13  Yes Ileana Ladd, MD  finasteride (PROSCAR) 5 MG tablet Take 1 tablet (5 mg total) by mouth daily. 04/29/13  Yes Ileana Ladd, MD  finasteride (PROSCAR) 5 MG tablet  07/28/13  Yes Historical Provider, MD  furosemide (LASIX) 40 MG tablet Take 1 tablet (40 mg total) by mouth 2 (two) times daily. 04/29/13  Yes Ileana Ladd, MD  metFORMIN (GLUCOPHAGE) 500 MG tablet Take 1 tablet (500 mg total) by mouth 2 (two) times daily with a meal. 04/29/13  Yes Ileana Ladd, MD  metoprolol (LOPRESSOR) 50 MG tablet Take 1 tablet (50 mg total) by mouth daily. 04/29/13  Yes Ileana Ladd, MD  metoprolol succinate (TOPROL-XL) 100 MG 24 hr tablet Take 1 tablet (100 mg total) by mouth 1 day or 1 dose. 04/29/13  Yes Ileana Ladd, MD  niacin (NIASPAN) 1000 MG CR tablet Take 1 tablet (1,000 mg total) by mouth at bedtime. 04/29/13  Yes Ileana Ladd, MD  pantoprazole (PROTONIX) 40 MG tablet Take 1 tablet (40 mg total) by mouth daily. 07/13/13  Yes Dione Booze, MD  pravastatin (PRAVACHOL) 40 MG tablet Take 1 tablet (40 mg total) by mouth daily. 04/29/13  Yes Ileana Ladd, MD     ROS: As above in the HPI. All other systems are stable or negative.  OBJECTIVE: APPEARANCE:  Patient in no acute distress.The patient appeared well nourished and normally developed. Acyanotic. Waist: VITAL SIGNS:BP 154/71  Pulse 51  Temp(Src) 97.9 F (36.6 C)  Wt 177 lb 1.6 oz (80.332 kg)  BMI 29.47 kg/m2  AAMale overweight  SKIN: warm and  Dry without overt rashes, tattoos and scars  HEAD and Neck: without JVD, Head and scalp: normal Eyes:No scleral icterus. Fundi normal, eye movements normal. Ears: Auricle  normal, canal normal, Tympanic membranes normal, insufflation normal. Nose: normal Throat: normal Neck & thyroid: normal  CHEST & LUNGS: Chest wall: normal Lungs: Clear  CVS: Reveals the PMI to be normally located. Regular rhythm, First and Second Heart sounds are normal,  absence of murmurs, rubs or gallops. Peripheral vasculature: Radial pulses: normal Dorsal pedis pulses: normal Posterior pulses: normal  ABDOMEN:  Appearance: overweight Benign, no organomegaly, no masses, no Abdominal Aortic enlargement. No Guarding , no rebound. No Bruits. Bowel sounds: normal  RECTAL: N/A GU: N/A  EXTREMETIES: nonedematous.  MUSCULOSKELETAL:  Spine: normal Joints: intact  NEUROLOGIC: oriented to time,place and person; nonfocal. Strength is normal Sensory is normal Reflexes are normal Cranial Nerves are normal.  Results for orders placed in visit on 07/29/13  POCT GLYCOSYLATED HEMOGLOBIN (HGB A1C)      Result Value Range   Hemoglobin A1C 5.5      ASSESSMENT: HLD (hyperlipidemia) - Plan: CMP14+EGFR, NMR, lipoprofile, pravastatin (PRAVACHOL) 40 MG tablet, niacin (NIASPAN) 1000 MG CR tablet  HTN (hypertension) - Plan: CMP14+EGFR, benazepril (LOTENSIN) 40 MG tablet  Type II or unspecified type diabetes mellitus without mention of complication, not stated as uncontrolled - Plan: POCT glycosylated hemoglobin (Hb A1C), CMP14+EGFR, metFORMIN (GLUCOPHAGE) 500 MG tablet  Gout - Plan: Uric acid   PLAN:       Dr Woodroe Mode Recommendations  For nutrition information, I recommend books:  1).Eat to Live by Dr Monico Hoar. 2).Prevent and Reverse Heart Disease by Dr Suzzette Righter. 3) Dr Katherina Right Book:  Program to Reverse Diabetes  Exercise recommendations are:  If unable to walk, then the patient can exercise in a chair 3 times a day. By flapping arms like a bird gently and raising legs outwards to the front.  If ambulatory, the patient can go for walks for 30  minutes 3 times a week. Then increase the intensity and duration as tolerated.  Goal is to try to attain exercise frequency to 5 times a week.  If applicable: Best to perform resistance exercises (machines or weights) 2 days a week and cardio type exercises 3 days per week.  Orders Placed This Encounter  Procedures  . CMP14+EGFR  . NMR, lipoprofile  . Uric acid  . POCT glycosylated hemoglobin (Hb A1C)   Meds ordered this encounter  Medications  . finasteride (PROSCAR) 5 MG tablet    Sig:   . pravastatin (PRAVACHOL) 40 MG tablet    Sig: Take 1 tablet (40 mg total) by mouth daily.    Dispense:  30 tablet    Refill:  11  . niacin (NIASPAN) 1000 MG CR tablet    Sig: Take 1 tablet (1,000 mg total) by mouth at bedtime.    Dispense:  30 tablet    Refill:  11  . metFORMIN (GLUCOPHAGE) 500 MG tablet    Sig: Take 1 tablet (500 mg total) by mouth 2 (two) times daily with a meal.    Dispense:  60 tablet    Refill:  11  . benazepril (LOTENSIN) 40 MG tablet    Sig: Take 1 tablet (40 mg total) by mouth daily.    Dispense:  30 tablet    Refill:  5   Return in about 3 months (around 10/29/2013) for Recheck medical problems.  Vali Capano P. [place] Charon, M.D.

## 2013-07-29 NOTE — Patient Instructions (Signed)
DASH Diet The DASH diet stands for "Dietary Approaches to Stop Hypertension." It is a healthy eating plan that has been shown to reduce high blood pressure (hypertension) in as little as 14 days, while also possibly providing other significant health benefits. These other health benefits include reducing the risk of breast cancer after menopause and reducing the risk of type 2 diabetes, heart disease, colon cancer, and stroke. Health benefits also include weight loss and slowing kidney failure in patients with chronic kidney disease.  DIET GUIDELINES  Limit salt (sodium). Your diet should contain less than 1500 mg of sodium daily.  Limit refined or processed carbohydrates. Your diet should include mostly whole grains. Desserts and added sugars should be used sparingly.  Include small amounts of heart-healthy fats. These types of fats include nuts, oils, and tub margarine. Limit saturated and trans fats. These fats have been shown to be harmful in the body. CHOOSING FOODS  The following food groups are based on a 2000 calorie diet. See your Registered Dietitian for individual calorie needs. Grains and Grain Products (6 to 8 servings daily)  Eat More Often: Whole-wheat bread, brown rice, whole-grain or wheat pasta, quinoa, popcorn without added fat or salt (air popped).  Eat Less Often: White bread, white pasta, white rice, cornbread. Vegetables (4 to 5 servings daily)  Eat More Often: Fresh, frozen, and canned vegetables. Vegetables may be raw, steamed, roasted, or grilled with a minimal amount of fat.  Eat Less Often/Avoid: Creamed or fried vegetables. Vegetables in a cheese sauce. Fruit (4 to 5 servings daily)  Eat More Often: All fresh, canned (in natural juice), or frozen fruits. Dried fruits without added sugar. One hundred percent fruit juice ( cup [237 mL] daily).  Eat Less Often: Dried fruits with added sugar. Canned fruit in light or heavy syrup. [employer], Fish, and Poultry (2  servings or less daily. One serving is 3 to 4 oz [85-114 g]).  Eat More Often: Ninety percent or leaner ground beef, tenderloin, sirloin. Round cuts of beef, chicken breast, [place] breast. All fish. Grill, bake, or broil your meat. Nothing should be fried.  Eat Less Often/Avoid: Fatty cuts of meat, [place], or chicken leg, thigh, or wing. Fried cuts of meat or fish. Dairy (2 to 3 servings)  Eat More Often: Low-fat or fat-free milk, low-fat plain or light yogurt, reduced-fat or part-skim cheese.  Eat Less Often/Avoid: Milk (whole, 2%).Whole milk yogurt. Full-fat cheeses. Nuts, Seeds, and Legumes (4 to 5 servings per week)  Eat More Often: All without added salt.  Eat Less Often/Avoid: Salted nuts and seeds, canned beans with added salt. Fats and Sweets (limited)  Eat More Often: Vegetable oils, tub margarines without trans fats, sugar-free gelatin. Mayonnaise and salad dressings.  Eat Less Often/Avoid: Coconut oils, palm oils, butter, stick margarine, cream, half and half, cookies, candy, pie. FOR MORE INFORMATION The Dash Diet Eating Plan: www.dashdiet.org Document Released: 11/16/2011 Document Revised: 02/19/2012 Document Reviewed: 11/16/2011 [facility] Patient Information 2014 [place], [place].   Diabetes and Foot Care Diabetes may cause you to have a poor blood supply (circulation) to your legs and feet. Because of this, the skin may be thinner, break easier, and heal more slowly. You also may have nerve damage in your legs and feet causing decreased feeling. You may not notice minor injuries to your feet that could lead to serious problems or infections. Taking care of your feet is one of the most important things you can do for yourself.  HOME CARE INSTRUCTIONS  Do not go barefoot. Bare feet are easily injured.  Check your feet daily for blisters, cuts, and redness.  Wash your feet with warm water (not hot) and mild soap. Pat your feet and between your toes until completely  dry.  Apply a moisturizing lotion that does not contain alcohol or petroleum jelly to the dry skin on your feet and to dry brittle toenails. Do not put it between your toes.  Trim your toenails straight across. Do not dig under them or around the cuticle.  Do not cut corns or calluses, or try to remove them with medicine.  Wear clean cotton socks or stockings every day. Make sure they are not too tight. Do not wear knee high stockings since they may decrease blood flow to your legs.  Wear leather shoes that fit properly and have enough cushioning. To break in new shoes, wear them just a few hours a day to avoid injuring your feet.  Wear shoes at all times, even in the house.  Do not cross your legs. This may decrease the blood flow to your feet.  If you find a minor scrape, cut, or break in the skin on your feet, keep it and the skin around it clean and dry. These areas may be cleansed with mild soap and water. Do not use peroxide, alcohol, iodine or Merthiolate.  When you remove an adhesive bandage, be sure not to harm the skin around it.  If you have a wound, look at it several times a day to make sure it is healing.  Do not use heating pads or hot water bottles. Burns can occur. If you have lost feeling in your feet or legs, you may not know it is happening until it is too late.  Report any cuts, sores or bruises to your caregiver. Do not wait! SEEK MEDICAL CARE IF:   You have an injury that is not healing or you notice redness, numbness, burning, or tingling.  Your feet always feel cold.  You have pain or cramps in your legs and feet. SEEK IMMEDIATE MEDICAL CARE IF:   There is increasing redness, swelling, or increasing pain in the wound.  There is a red line that goes up your leg.  Pus is coming from a wound.  You develop an unexplained oral temperature above 102 F (38.9 C), or as your caregiver suggests.  You notice a bad smell coming from an ulcer or wound. MAKE  SURE YOU:   Understand these instructions.  Will watch your condition.  Will get help right away if you are not doing well or get worse. Document Released: 11/24/2000 Document Revised: 02/19/2012 Document Reviewed: 06/02/2009 [facility] Patient Information 2014 [place], [place].        Dr Woodroe Mode Recommendations  For nutrition information, I recommend books:  1).Eat to Live by Dr Monico Hoar. 2).Prevent and Reverse Heart Disease by Dr Suzzette Righter. 3) Dr Katherina Right Book:  Program to Reverse Diabetes  Exercise recommendations are:  If unable to walk, then the patient can exercise in a chair 3 times a day. By flapping arms like a bird gently and raising legs outwards to the front.  If ambulatory, the patient can go for walks for 30 minutes 3 times a week. Then increase the intensity and duration as tolerated.  Goal is to try to attain exercise frequency to 5 times a week.  If applicable: Best to perform resistance exercises (machines or weights) 2 days a week and cardio type exercises  3 days per week.

## 2013-07-30 LAB — NMR, LIPOPROFILE
Cholesterol: 95 mg/dL (ref <200)
HDL Cholesterol by NMR: 34 mg/dL — ABNORMAL LOW (ref >=40)
HDL Particle Number: 28.3 umol/L — ABNORMAL LOW (ref >=30.5)
LDL Particle Number: 497 nmol/L (ref <1000)
LDL Size: 20 nm — ABNORMAL LOW (ref >20.5)
LDLC SERPL CALC-MCNC: 31 mg/dL (ref <100)
LP-IR Score: 56 — ABNORMAL HIGH (ref <=45)
Small LDL Particle Number: 320 nmol/L (ref <=527)
Triglycerides by NMR: 149 mg/dL (ref <150)

## 2013-07-30 LAB — CMP14+EGFR
ALT: 21 IU/L (ref 0–44)
AST: 21 IU/L (ref 0–40)
Albumin/Globulin Ratio: 1.5 (ref 1.1–2.5)
Albumin: 4.5 g/dL (ref 3.5–4.8)
Alkaline Phosphatase: 150 IU/L — ABNORMAL HIGH (ref 39–117)
BUN/Creatinine Ratio: 11 (ref 10–22)
BUN: 11 mg/dL (ref 8–27)
CO2: 26 mmol/L (ref 18–29)
Calcium: 9.7 mg/dL (ref 8.6–10.2)
Chloride: 100 mmol/L (ref 97–108)
Creatinine, Ser: 1.02 mg/dL (ref 0.76–1.27)
GFR calc Af Amer: 86 mL/min/{1.73_m2} (ref >59)
GFR calc non Af Amer: 74 mL/min/{1.73_m2} (ref >59)
Globulin, Total: 3 g/dL (ref 1.5–4.5)
Glucose: 95 mg/dL (ref 65–99)
Potassium: 3.6 mmol/L (ref 3.5–5.2)
Sodium: 141 mmol/L (ref 134–144)
Total Bilirubin: 0.6 mg/dL (ref 0.0–1.2)
Total Protein: 7.5 g/dL (ref 6.0–8.5)

## 2013-07-30 LAB — URIC ACID: Uric Acid: 5.5 mg/dL (ref 3.7–8.6)

## 2013-07-31 ENCOUNTER — Telehealth: Payer: Self-pay | Admitting: Family Medicine

## 2013-07-31 NOTE — Progress Notes (Signed)
Quick Note:  Lab result at goal.or too tightly controlled. Change in Medications recommended: 1)reduce the metformin to one tablet daily in the morning 2) reduce the pravastatin to 1/2 tablet daily. 3) aggressive plant based diet can help reduce even further needs for medications. No Change in follow up appointment. ______

## 2013-07-31 NOTE — Telephone Encounter (Signed)
Patient aware.

## 2013-08-04 ENCOUNTER — Other Ambulatory Visit: Payer: Self-pay | Admitting: *Deleted

## 2013-08-04 MED ORDER — PANTOPRAZOLE SODIUM 40 MG PO TBEC: 40.0000 mg | DELAYED_RELEASE_TABLET | Freq: Every day | ORAL | Status: DC

## 2013-08-26 ENCOUNTER — Encounter: Payer: Self-pay | Admitting: *Deleted

## 2013-10-02 ENCOUNTER — Ambulatory Visit (INDEPENDENT_AMBULATORY_CARE_PROVIDER_SITE_OTHER): Payer: Medicare Other | Admitting: *Deleted

## 2013-10-02 DIAGNOSIS — Z23 Encounter for immunization: Secondary | ICD-10-CM

## 2013-10-13 ENCOUNTER — Encounter: Payer: Self-pay | Admitting: *Deleted

## 2013-10-31 ENCOUNTER — Ambulatory Visit: Payer: Medicare Other | Admitting: Family Medicine

## 2013-11-05 ENCOUNTER — Encounter: Payer: Self-pay | Admitting: Family Medicine

## 2013-11-05 ENCOUNTER — Ambulatory Visit (INDEPENDENT_AMBULATORY_CARE_PROVIDER_SITE_OTHER): Payer: Medicare Other | Admitting: Family Medicine

## 2013-11-05 ENCOUNTER — Encounter (INDEPENDENT_AMBULATORY_CARE_PROVIDER_SITE_OTHER): Payer: Self-pay

## 2013-11-05 VITALS — BP 149/62 | HR 50 | Temp 98.5°F | Ht 66.0 in | Wt 176.8 lb

## 2013-11-05 DIAGNOSIS — I10 Essential (primary) hypertension: Secondary | ICD-10-CM

## 2013-11-05 DIAGNOSIS — E669 Obesity, unspecified: Secondary | ICD-10-CM

## 2013-11-05 DIAGNOSIS — M109 Gout, unspecified: Secondary | ICD-10-CM

## 2013-11-05 DIAGNOSIS — N400 Enlarged prostate without lower urinary tract symptoms: Secondary | ICD-10-CM

## 2013-11-05 DIAGNOSIS — E119 Type 2 diabetes mellitus without complications: Secondary | ICD-10-CM

## 2013-11-05 DIAGNOSIS — E785 Hyperlipidemia, unspecified: Secondary | ICD-10-CM

## 2013-11-05 LAB — POCT UA - MICROALBUMIN: Microalbumin Ur, POC: NEGATIVE mg/L

## 2013-11-05 LAB — POCT GLYCOSYLATED HEMOGLOBIN (HGB A1C): Hemoglobin A1C: 5.4

## 2013-11-05 NOTE — Progress Notes (Signed)
Patient ID: Alan [name], male   DOB: 01-17-1941, 72 y.o.   MRN: [id number] SUBJECTIVE: CC: Chief Complaint  Patient presents with  . Follow-up    3 month follow up     HPI:   Patient is here for follow up of Diabetes Mellitus/htn/hld/gout: Symptoms evaluated: Denies Nocturia ,Denies Urinary Frequency , denies Blurred vision ,deniesDizziness,denies.Dysuria,denies paresthesias, denies extremity pain or ulcers.Marland Kitchendenies chest pain. has had an annual eye exam. do check the feet. Does check CBGs. Average WUJ:WJXBJY Denies episodes of hypoglycemia. Does have an emergency hypoglycemic plan. admits toCompliance with medications. Denies Problems with medications.  Past Medical History  Diagnosis Date  . Hypertension   . Heart murmur   . ED (erectile dysfunction)   . Agranulocytosis   . Diabetes mellitus   . Hyperlipidemia   . Gout   . Prostate cancer     Followed by Dr. Annabell Howells   . Adenomatous colon polyp    Past Surgical History  Procedure Laterality Date  . Colonoscopy    . Sigmoidoscopy     History   Social History  . Marital Status: Married    Spouse Name: N/A    Number of Children: N/A  . Years of Education: N/A   Occupational History  . Not on file.   Social History Main Topics  . Smoking status: Former Games developer  . Smokeless tobacco: Never Used  . Alcohol Use: No  . Drug Use: No  . Sexual Activity: Not on file   Other Topics Concern  . Not on file   Social History Narrative  . No narrative on file   Family History  Problem Relation Age of Onset  . Stroke Mother    Current Outpatient Prescriptions on File Prior to Visit  Medication Sig Dispense Refill  . amLODipine (NORVASC) 10 MG tablet Take 1 tablet (10 mg total) by mouth daily.  30 tablet  11  . aspirin (BAYER ASPIRIN) 325 MG tablet Take 325 mg by mouth every morning.        . benazepril (LOTENSIN) 40 MG tablet Take 1 tablet (40 mg total) by mouth daily.  30 tablet  5  . febuxostat (ULORIC) 40 MG tablet  Take 2 tablets (80 mg total) by mouth daily.  60 tablet  11  . finasteride (PROSCAR) 5 MG tablet Take 1 tablet (5 mg total) by mouth daily.  30 tablet  11  . furosemide (LASIX) 40 MG tablet Take 1 tablet (40 mg total) by mouth 2 (two) times daily.  30 tablet  11  . metFORMIN (GLUCOPHAGE) 500 MG tablet Take 1 tablet (500 mg total) by mouth 2 (two) times daily with a meal.  60 tablet  11  . metoprolol (LOPRESSOR) 50 MG tablet Take 1 tablet (50 mg total) by mouth daily.  30 tablet  11  . metoprolol succinate (TOPROL-XL) 100 MG 24 hr tablet Take 1 tablet (100 mg total) by mouth 1 day or 1 dose.  30 tablet  11  . pravastatin (PRAVACHOL) 40 MG tablet Take 1 tablet (40 mg total) by mouth daily.  30 tablet  11  . niacin (NIASPAN) 1000 MG CR tablet Take 1 tablet (1,000 mg total) by mouth at bedtime.  30 tablet  11  . pantoprazole (PROTONIX) 40 MG tablet Take 1 tablet (40 mg total) by mouth daily.  30 tablet  3   No current facility-administered medications on file prior to visit.   Allergies  Allergen Reactions  . Allopurinol Other (See Comments)  Significantly decreased WBC's  . Colchicine Other (See Comments)    Significantly decreased WBC's   Immunization History  Administered Date(s) Administered  . Influenza,inj,Quad PF,36+ Mos 10/02/2013   Prior to Admission medications   Medication Sig Start Date End Date Taking? Authorizing Provider  amLODipine (NORVASC) 10 MG tablet Take 1 tablet (10 mg total) by mouth daily. 04/29/13   Ileana Ladd, MD  aspirin (BAYER ASPIRIN) 325 MG tablet Take 325 mg by mouth every morning.      Historical Provider, MD  benazepril (LOTENSIN) 40 MG tablet Take 1 tablet (40 mg total) by mouth daily. 07/29/13   Ileana Ladd, MD  febuxostat (ULORIC) 40 MG tablet Take 2 tablets (80 mg total) by mouth daily. 05/26/13   Ileana Ladd, MD  finasteride (PROSCAR) 5 MG tablet Take 1 tablet (5 mg total) by mouth daily. 04/29/13   Ileana Ladd, MD  finasteride (PROSCAR) 5 MG  tablet  07/28/13   Historical Provider, MD  furosemide (LASIX) 40 MG tablet Take 1 tablet (40 mg total) by mouth 2 (two) times daily. 04/29/13   Ileana Ladd, MD  metFORMIN (GLUCOPHAGE) 500 MG tablet Take 1 tablet (500 mg total) by mouth 2 (two) times daily with a meal. 07/29/13   Ileana Ladd, MD  metoprolol (LOPRESSOR) 50 MG tablet Take 1 tablet (50 mg total) by mouth daily. 04/29/13   Ileana Ladd, MD  metoprolol succinate (TOPROL-XL) 100 MG 24 hr tablet Take 1 tablet (100 mg total) by mouth 1 day or 1 dose. 04/29/13   Ileana Ladd, MD  niacin (NIASPAN) 1000 MG CR tablet Take 1 tablet (1,000 mg total) by mouth at bedtime. 07/29/13   Ileana Ladd, MD  pantoprazole (PROTONIX) 40 MG tablet Take 1 tablet (40 mg total) by mouth daily. 08/04/13   Ernestina Penna, MD  pravastatin (PRAVACHOL) 40 MG tablet Take 1 tablet (40 mg total) by mouth daily. 07/29/13   Ileana Ladd, MD     ROS: As above in the HPI. All other systems are stable or negative.  OBJECTIVE: APPEARANCE:  Patient in no acute distress.The patient appeared well nourished and normally developed. Acyanotic. Waist: VITAL SIGNS:BP 149/62  Pulse 50  Temp(Src) 98.5 F (36.9 C) (Oral)  Ht 5\' 6"  (1.676 m)  Wt 176 lb 12.8 oz (80.196 kg)  BMI 28.55 kg/m2  WM SKIN: warm and  Dry without overt rashes, tattoos and scars  HEAD and Neck: without JVD, Head and scalp: normal Eyes:No scleral icterus. Fundi normal, eye movements normal. Ears: Auricle normal, canal normal, Tympanic membranes normal, insufflation normal. Nose: normal Throat: normal Neck & thyroid: normal  CHEST & LUNGS: Chest wall: normal Lungs: Clear  CVS: Reveals the PMI to be normally located. Regular rhythm, First and Second Heart sounds are normal,  absence of murmurs, rubs or gallops. Peripheral vasculature: Radial pulses: normal Dorsal pedis pulses: normal Posterior pulses: normal  ABDOMEN:  Appearance: normal Benign, no organomegaly, no masses, no  Abdominal Aortic enlargement. No Guarding , no rebound. No Bruits. Bowel sounds: normal  RECTAL: N/A GU: N/A  EXTREMETIES: nonedematous.  MUSCULOSKELETAL:  Spine: normal Joints: intact  NEUROLOGIC: oriented to time,place and person; nonfocal. Strength is normal Sensory is normal Reflexes are normal Cranial Nerves are normal.  Results for orders placed in visit on 07/29/13  CMP14+EGFR      Result Value Range   Glucose 95  65 - 99 mg/dL   BUN 11  8 - 27  mg/dL   Creatinine, Ser 4.09  0.76 - 1.27 mg/dL   GFR calc non Af Amer 74  >59 mL/min/1.73   GFR calc Af Amer 86  >59 mL/min/1.73   BUN/Creatinine Ratio 11  10 - 22   Sodium 141  134 - 144 mmol/L   Potassium 3.6  3.5 - 5.2 mmol/L   Chloride 100  97 - 108 mmol/L   CO2 26  18 - 29 mmol/L   Calcium 9.7  8.6 - 10.2 mg/dL   Total Protein 7.5  6.0 - 8.5 g/dL   Albumin 4.5  3.5 - 4.8 g/dL   Globulin, Total 3.0  1.5 - 4.5 g/dL   Albumin/Globulin Ratio 1.5  1.1 - 2.5   Total Bilirubin 0.6  0.0 - 1.2 mg/dL   Alkaline Phosphatase 150 (*) 39 - 117 IU/L   AST 21  0 - 40 IU/L   ALT 21  0 - 44 IU/L  NMR, LIPOPROFILE      Result Value Range   LDL Particle Number 497  <1000 nmol/L   LDLC SERPL CALC-MCNC 31  <100 mg/dL   HDL Cholesterol by NMR 34 (*) >=40 mg/dL   Triglycerides by NMR 149  <150 mg/dL   Cholesterol 95  <811 mg/dL   HDL Particle Number 91.4 (*) >=30.5 umol/L   Small LDL Particle Number 320  <=527 nmol/L   LDL Size 20.0 (*) >20.5 nm   LP-IR Score 56 (*) <=45  URIC ACID      Result Value Range   Uric Acid 5.5  3.7 - 8.6 mg/dL  POCT GLYCOSYLATED HEMOGLOBIN (HGB A1C)      Result Value Range   Hemoglobin A1C 5.5      ASSESSMENT:   BPH (benign prostatic hyperplasia)  Gout - Plan: Uric acid  HLD (hyperlipidemia) - Plan: CMP14+EGFR, NMR, lipoprofile  HTN (hypertension) - Plan: CMP14+EGFR  Obesity, unspecified  Type II or unspecified type diabetes mellitus without mention of complication, not stated as  uncontrolled - Plan: POCT glycosylated hemoglobin (Hb A1C), POCT UA - Microalbumin  PLAN: Healthy lifestyle. Exercise weight control Orders Placed This Encounter  Procedures  . CMP14+EGFR  . NMR, lipoprofile  . Uric acid  . POCT glycosylated hemoglobin (Hb A1C)  . POCT UA - Microalbumin   No orders of the defined types were placed in this encounter.   Medications Discontinued During This Encounter  Medication Reason  . finasteride (PROSCAR) 5 MG tablet Duplicate   Return in about 3 months (around 02/05/2014) for Recheck medical problems.  Wilmont Olund P. [place] Charon, M.D.

## 2013-11-07 ENCOUNTER — Ambulatory Visit: Payer: Medicare Other | Admitting: Family Medicine

## 2013-11-07 LAB — CMP14+EGFR
ALT: 14 IU/L (ref 0–44)
AST: 18 IU/L (ref 0–40)
Albumin/Globulin Ratio: 1.4 (ref 1.1–2.5)
Albumin: 4.3 g/dL (ref 3.5–4.8)
Alkaline Phosphatase: 65 IU/L (ref 39–117)
BUN/Creatinine Ratio: 16 (ref 10–22)
BUN: 17 mg/dL (ref 8–27)
CO2: 27 mmol/L (ref 18–29)
Calcium: 10.1 mg/dL (ref 8.6–10.2)
Chloride: 98 mmol/L (ref 97–108)
Creatinine, Ser: 1.09 mg/dL (ref 0.76–1.27)
GFR calc Af Amer: 79 mL/min/{1.73_m2} (ref >59)
GFR calc non Af Amer: 68 mL/min/{1.73_m2} (ref >59)
Globulin, Total: 3 g/dL (ref 1.5–4.5)
Glucose: 94 mg/dL (ref 65–99)
Potassium: 4.2 mmol/L (ref 3.5–5.2)
Sodium: 139 mmol/L (ref 134–144)
Total Bilirubin: 1 mg/dL (ref 0.0–1.2)
Total Protein: 7.3 g/dL (ref 6.0–8.5)

## 2013-11-07 LAB — NMR, LIPOPROFILE
Cholesterol: 109 mg/dL (ref <200)
HDL Cholesterol by NMR: 33 mg/dL — ABNORMAL LOW (ref >=40)
HDL Particle Number: 26.4 umol/L — ABNORMAL LOW (ref >=30.5)
LDL Particle Number: 727 nmol/L (ref <1000)
LDL Size: 20.1 nm — ABNORMAL LOW (ref >20.5)
LDLC SERPL CALC-MCNC: 44 mg/dL (ref <100)
LP-IR Score: 46 — ABNORMAL HIGH (ref <=45)
Small LDL Particle Number: 611 nmol/L — ABNORMAL HIGH (ref <=527)
Triglycerides by NMR: 162 mg/dL — ABNORMAL HIGH (ref <150)

## 2013-11-07 LAB — URIC ACID: Uric Acid: 6.2 mg/dL (ref 3.7–8.6)

## 2013-11-11 ENCOUNTER — Telehealth: Payer: Self-pay | Admitting: Family Medicine

## 2013-11-11 NOTE — Telephone Encounter (Signed)
Message copied by Azalee Course on Tue Nov 11, 2013  3:15 PM ------      Message from: Alan [name]      Created: Sun Nov 09, 2013  9:15 PM       Call Patient      Lab result at goal.The HGBA1C was too low.May need to cut back the metformin to one tablet daily      No change in the other Medications for now.      No Change in recommendations and plans for follow up. ------

## 2013-11-11 NOTE — Telephone Encounter (Signed)
Left message to call back on labs  

## 2013-12-10 NOTE — Telephone Encounter (Signed)
PT AWARE OF LAB RESULTS AND TO CUT METFORMIN BACK TO ONE TAB. A DAY. RS

## 2013-12-16 ENCOUNTER — Encounter: Payer: Self-pay | Admitting: *Deleted

## 2013-12-19 DIAGNOSIS — Z0289 Encounter for other administrative examinations: Secondary | ICD-10-CM

## 2013-12-30 ENCOUNTER — Encounter (INDEPENDENT_AMBULATORY_CARE_PROVIDER_SITE_OTHER): Payer: Self-pay

## 2013-12-30 ENCOUNTER — Ambulatory Visit (INDEPENDENT_AMBULATORY_CARE_PROVIDER_SITE_OTHER): Payer: Medicare Other

## 2013-12-30 ENCOUNTER — Ambulatory Visit (INDEPENDENT_AMBULATORY_CARE_PROVIDER_SITE_OTHER): Payer: Medicare Other | Admitting: Family Medicine

## 2013-12-30 VITALS — BP 132/64 | HR 56 | Temp 98.2°F | Ht 66.0 in | Wt 177.0 lb

## 2013-12-30 DIAGNOSIS — M25569 Pain in unspecified knee: Secondary | ICD-10-CM

## 2013-12-30 DIAGNOSIS — M25579 Pain in unspecified ankle and joints of unspecified foot: Secondary | ICD-10-CM

## 2013-12-30 MED ORDER — TRAMADOL-ACETAMINOPHEN 37.5-325 MG PO TABS: 1.0000 | ORAL_TABLET | Freq: Four times a day (QID) | ORAL | Status: DC | PRN

## 2013-12-30 NOTE — Progress Notes (Signed)
Subjective:    Patient ID: Alan [name], male    DOB: [birth year], 73 y.o.   MRN: [id number]  HPI Patient presents today with chief complaint of left knee pain left ankle pain. Patient states that he was walking in the woods about 3 days ago when he accidentally slipped over a long striking his left knee and along his left ankle. Patient's had left knee pain left ankle pain since this point. Patient states that knee pain is mildly improved over the course of the past 3 days. Minimal swelling. Lateral ankle pain has been fairly persistent. Mild swelling with this. Patient denies any distal numbness or paresthesias. Is able to bear weight on the left leg albeit with pain.    Review of Systems  All other systems reviewed and are negative.       Objective:   Physical Exam  Constitutional: He appears well-developed and well-nourished.  HENT:  Head: Normocephalic and atraumatic.  Eyes: Conjunctivae are normal. Pupils are equal, round, and reactive to light.  Neck: Normal range of motion. Neck supple.  Cardiovascular: Normal rate and regular rhythm.   Pulmonary/Chest: Effort normal and breath sounds normal.  Abdominal: Soft. Bowel sounds are normal.  Musculoskeletal:       Legs:      Feet:  Mild popliteal TTP  Minimal sweling  Calves symmetric    Neurological: He is alert.  Skin: Skin is warm.   Filed Vitals:   12/30/13 1118  BP: 132/64  Pulse: 56  Temp: 98.2 F (36.8 C)  Dg Knee 1-2 Views Left  12/30/2013   CLINICAL DATA:  Left ankle pain and swelling after injury 3 days ago.  EXAM: LEFT KNEE - 1-2 VIEW; LEFT ANKLE COMPLETE - 3+ VIEW  COMPARISON:  None.  FINDINGS: There are mild tricompartmental osteoarthritic changes of the knee. There is evidence of a knee joint effusion. There is a lucency over the base of a superior patellar spur which may be acute or chronic. Marland Kitchen  There are degenerative changes over the left ankle, midfoot and hindfoot. There is spurring over the  posterior inferior calcaneus. There is no acute fracture or dislocation at the ankle.  IMPRESSION: Lucency at the base of a superior patellar spur which may be acute fracture versus chronic finding. Suggestion of a knee joint effusion.  No acute finding over the left ankle.  Mild tricompartmental osteoarthritis of the knee. Degenerative changes over the ankle and midfoot/hindfoot region.   Electronically Signed   By: Marin Olp M.D.   On: 12/30/2013 11:07   Dg Ankle Complete Left  12/30/2013   CLINICAL DATA:  Left ankle pain and swelling after injury 3 days ago.  EXAM: LEFT KNEE - 1-2 VIEW; LEFT ANKLE COMPLETE - 3+ VIEW  COMPARISON:  None.  FINDINGS: There are mild tricompartmental osteoarthritic changes of the knee. There is evidence of a knee joint effusion. There is a lucency over the base of a superior patellar spur which may be acute or chronic. Marland Kitchen  There are degenerative changes over the left ankle, midfoot and hindfoot. There is spurring over the posterior inferior calcaneus. There is no acute fracture or dislocation at the ankle.  IMPRESSION: Lucency at the base of a superior patellar spur which may be acute fracture versus chronic finding. Suggestion of a knee joint effusion.  No acute finding over the left ankle.  Mild tricompartmental osteoarthritis of the knee. Degenerative changes over the ankle and midfoot/hindfoot region.   Electronically Signed   By:  Marin Olp M.D.   On: 12/30/2013 11:07           Assessment & Plan:  Knee pain - Plan: DG Knee 1-2 Views Left, traMADol-acetaminophen (ULTRACET) 37.5-325 MG per tablet, Ambulatory referral to Orthopedic Surgery  Ankle pain - Plan: DG Ankle Complete Left, traMADol-acetaminophen (ULTRACET) 37.5-325 MG per tablet  Noted imaging findings as above.  Baseline arthritic degenerative disease.  ? Patellar fracture on imaging today.  Fairly stable exam Will send for same-day/next day ortho appt.  Hinged knee brace, ankle brace, crutches  in the interim.  ultracet for pain relief.  Will also send for LE u/s to rule out baker's cyst/DVT given popliteal TTP on exam.  Wells score 0-1.  Discussed general and MSK red flags.  Follow up as needed.

## 2014-02-16 ENCOUNTER — Other Ambulatory Visit: Payer: Self-pay | Admitting: Family Medicine

## 2014-03-23 ENCOUNTER — Telehealth: Payer: Self-pay | Admitting: Family Medicine

## 2014-03-23 NOTE — Telephone Encounter (Signed)
appt scheduled for may 4th at 11:40

## 2014-04-13 ENCOUNTER — Ambulatory Visit (INDEPENDENT_AMBULATORY_CARE_PROVIDER_SITE_OTHER): Payer: Medicare Other | Admitting: Family Medicine

## 2014-04-13 ENCOUNTER — Encounter: Payer: Self-pay | Admitting: Family Medicine

## 2014-04-13 VITALS — BP 140/80 | HR 48 | Temp 98.9°F | Ht 66.0 in | Wt 185.2 lb

## 2014-04-13 DIAGNOSIS — E119 Type 2 diabetes mellitus without complications: Secondary | ICD-10-CM

## 2014-04-13 DIAGNOSIS — M109 Gout, unspecified: Secondary | ICD-10-CM

## 2014-04-13 DIAGNOSIS — C61 Malignant neoplasm of prostate: Secondary | ICD-10-CM

## 2014-04-13 DIAGNOSIS — N400 Enlarged prostate without lower urinary tract symptoms: Secondary | ICD-10-CM

## 2014-04-13 DIAGNOSIS — E669 Obesity, unspecified: Secondary | ICD-10-CM

## 2014-04-13 DIAGNOSIS — I10 Essential (primary) hypertension: Secondary | ICD-10-CM

## 2014-04-13 DIAGNOSIS — N529 Male erectile dysfunction, unspecified: Secondary | ICD-10-CM | POA: Insufficient documentation

## 2014-04-13 DIAGNOSIS — E785 Hyperlipidemia, unspecified: Secondary | ICD-10-CM

## 2014-04-13 LAB — POCT GLYCOSYLATED HEMOGLOBIN (HGB A1C): Hemoglobin A1C: 5.9

## 2014-04-13 LAB — HEMOGLOBIN A1C: Hgb A1c MFr Bld: 5.9 % (ref 4.0–6.0)

## 2014-04-13 MED ORDER — FEBUXOSTAT 40 MG PO TABS: 80.0000 mg | ORAL_TABLET | Freq: Every day | ORAL | Status: DC

## 2014-04-13 MED ORDER — BENAZEPRIL HCL 40 MG PO TABS: ORAL_TABLET | ORAL | Status: DC

## 2014-04-13 MED ORDER — AMLODIPINE BESYLATE 10 MG PO TABS: 10.0000 mg | ORAL_TABLET | Freq: Every day | ORAL | Status: DC

## 2014-04-13 MED ORDER — METFORMIN HCL 500 MG PO TABS: 500.0000 mg | ORAL_TABLET | Freq: Two times a day (BID) | ORAL | Status: DC

## 2014-04-13 MED ORDER — METOPROLOL SUCCINATE ER 100 MG PO TB24: 100.0000 mg | ORAL_TABLET | ORAL | Status: DC

## 2014-04-13 MED ORDER — FINASTERIDE 5 MG PO TABS: 5.0000 mg | ORAL_TABLET | Freq: Every day | ORAL | Status: DC

## 2014-04-13 MED ORDER — NIACIN ER (ANTIHYPERLIPIDEMIC) 1000 MG PO TBCR: 1000.0000 mg | EXTENDED_RELEASE_TABLET | Freq: Every day | ORAL | Status: DC

## 2014-04-13 MED ORDER — FUROSEMIDE 40 MG PO TABS: 40.0000 mg | ORAL_TABLET | Freq: Two times a day (BID) | ORAL | Status: DC

## 2014-04-13 NOTE — Patient Instructions (Signed)
DASH Diet The DASH diet stands for "Dietary Approaches to Stop Hypertension." It is a healthy eating plan that has been shown to reduce high blood pressure (hypertension) in as little as 14 days, while also possibly providing other significant health benefits. These other health benefits include reducing the risk of breast cancer after menopause and reducing the risk of type 2 diabetes, heart disease, colon cancer, and stroke. Health benefits also include weight loss and slowing kidney failure in patients with chronic kidney disease.  DIET GUIDELINES  Limit salt (sodium). Your diet should contain less than 1500 mg of sodium daily.  Limit refined or processed carbohydrates. Your diet should include mostly whole grains. Desserts and added sugars should be used sparingly.  Include small amounts of heart-healthy fats. These types of fats include nuts, oils, and tub margarine. Limit saturated and trans fats. These fats have been shown to be harmful in the body. CHOOSING FOODS  The following food groups are based on a 2000 calorie diet. See your Registered Dietitian for individual calorie needs. Grains and Grain Products (6 to 8 servings daily)  Eat More Often: Whole-wheat bread, brown rice, whole-grain or wheat pasta, quinoa, popcorn without added fat or salt (air popped).  Eat Less Often: White bread, white pasta, white rice, cornbread. Vegetables (4 to 5 servings daily)  Eat More Often: Fresh, frozen, and canned vegetables. Vegetables may be raw, steamed, roasted, or grilled with a minimal amount of fat.  Eat Less Often/Avoid: Creamed or fried vegetables. Vegetables in a cheese sauce. Fruit (4 to 5 servings daily)  Eat More Often: All fresh, canned (in natural juice), or frozen fruits. Dried fruits without added sugar. One hundred percent fruit juice ( cup [237 mL] daily).  Eat Less Often: Dried fruits with added sugar. Canned fruit in light or heavy syrup. [employer], Fish, and Poultry (2  servings or less daily. One serving is 3 to 4 oz [85-114 g]).  Eat More Often: Ninety percent or leaner ground beef, tenderloin, sirloin. Round cuts of beef, chicken breast, [place] breast. All fish. Grill, bake, or broil your meat. Nothing should be fried.  Eat Less Often/Avoid: Fatty cuts of meat, [place], or chicken leg, thigh, or wing. Fried cuts of meat or fish. Dairy (2 to 3 servings)  Eat More Often: Low-fat or fat-free milk, low-fat plain or light yogurt, reduced-fat or part-skim cheese.  Eat Less Often/Avoid: Milk (whole, 2%).Whole milk yogurt. Full-fat cheeses. Nuts, Seeds, and Legumes (4 to 5 servings per week)  Eat More Often: All without added salt.  Eat Less Often/Avoid: Salted nuts and seeds, canned beans with added salt. Fats and Sweets (limited)  Eat More Often: Vegetable oils, tub margarines without trans fats, sugar-free gelatin. Mayonnaise and salad dressings.  Eat Less Often/Avoid: Coconut oils, palm oils, butter, stick margarine, cream, half and half, cookies, candy, pie. FOR MORE INFORMATION The Dash Diet Eating Plan: www.dashdiet.org Document Released: 11/16/2011 Document Revised: 02/19/2012 Document Reviewed: 11/16/2011 [facility] Patient Information 2014 [place], [place].   Purine Restricted Diet A low-purine diet consists of foods that reduce uric acid made in your body. INDICATIONS FOR USE  Your caregiver may ask you to follow a low-purine diet to reduce gout flairs.  GUIDELINES  Avoid high-purine foods, including all alcohol, yeast extracts taken as supplements, and sauces made from meats (like gravy). Do not eat high-purine meats, including anchovies, sardines, herring, mussels, tuna, codfish, scallops, trout, haddock, bacon, organ meats, tripe, goose, wild game, and sweetbreads.  Grains  Allowed/Recommended: All, except  those listed to consume in moderation.  Consume in Moderation: Oatmeal ( cup uncooked daily), wheat bran or germ ( cup daily), and  whole grains. Vegetables  Allowed/Recommended: All, except those listed to consume in moderation.  Consume in Moderation: Asparagus, cauliflower, spinach, mushrooms, and green peas ( cup daily). Fruit  Allowed/Recommended: All.  Consume in Moderation: None. Meat and Meat Substitutes  Allowed/Recommended: Eggs, nuts, and peanut butter.  Consume in Moderation: Limit to 4 to 6 oz daily. Avoid high-purine meats. Lentils, peas, and dried beans (1 cup daily). Milk  Allowed/Recommended: All. Choose low-fat or skim when possible.  Consume in Moderation: None. Fats and Oils  Allowed/Recommended: All.  Consume in Moderation: None. Beverages  Allowed/Recommended: All, except those listed to avoid.  Avoid: All alcohol. Condiments/Miscellaneous  Allowed/Recommended: All, except those listed to consume in moderation.  Consume in Moderation: Bouillon and meat-based broths and soups. Document Released: 03/24/2011 Document Revised: 02/19/2012 Document Reviewed: 03/24/2011 [facility] Patient Information 2014 [place].

## 2014-04-13 NOTE — Progress Notes (Signed)
Patient ID: Alan [name], male   DOB: 07/17/1941, 73 y.o.   MRN: [id number] SUBJECTIVE: CC: Chief Complaint  Patient presents with  . Diabetes    6 MONTH RECHECK  . Hypertension  . Hyperlipidemia    HPI: Patient is here for follow up of Diabetes Mellitus/HLD/HTN/Prostate cancer/Gout: Symptoms evaluated: Denies Nocturia ,Denies Urinary Frequency , denies Blurred vision ,deniesDizziness,denies.Dysuria,denies paresthesias, denies extremity pain or ulcers..denies chest pain. has had an annual eye exam. do check the feet. Does check CBGs. Average CBG:good in the 100s Denies episodes of hypoglycemia. Does have an emergency hypoglycemic plan. admits toCompliance with medications. Denies Problems with medications.   doing well no complaints has regular follow up wih Urology.  No gout problems.  Past Medical History  Diagnosis Date  . Hypertension   . Heart murmur   . ED (erectile dysfunction)   . Agranulocytosis   . Diabetes mellitus   . Hyperlipidemia   . Gout   . Adenomatous colon polyp   . Prostate cancer     Followed by Dr. Wrenn    Past Surgical History  Procedure Laterality Date  . Colonoscopy    . Sigmoidoscopy     History   Social History  . Marital Status: Married    Spouse Name: N/A    Number of Children: N/A  . Years of Education: N/A   Occupational History  . Not on file.   Social History Main Topics  . Smoking status: Former Smoker  . Smokeless tobacco: Never Used  . Alcohol Use: No  . Drug Use: No  . Sexual Activity: Not on file   Other Topics Concern  . Not on file   Social History Narrative  . No narrative on file   Family History  Problem Relation Age of Onset  . Stroke Mother    Current Outpatient Prescriptions on File Prior to Visit  Medication Sig Dispense Refill  . aspirin 81 MG tablet Take 81 mg by mouth daily.      . pantoprazole (PROTONIX) 40 MG tablet Take 1 tablet (40 mg total) by mouth daily.  30 tablet  3  . pravastatin  (PRAVACHOL) 40 MG tablet Take 1 tablet (40 mg total) by mouth daily.  30 tablet  11  . traMADol-acetaminophen (ULTRACET) 37.5-325 MG per tablet Take 1 tablet by mouth every 6 (six) hours as needed.  30 tablet  0   No current facility-administered medications on file prior to visit.   Allergies  Allergen Reactions  . Allopurinol Other (See Comments)    Significantly decreased WBC's  . Colchicine Other (See Comments)    Significantly decreased WBC's   Immunization History  Administered Date(s) Administered  . Influenza,inj,Quad PF,36+ Mos 10/02/2013   Prior to Admission medications   Medication Sig Start Date End Date Taking? Authorizing Provider  amLODipine (NORVASC) 10 MG tablet Take 1 tablet (10 mg total) by mouth daily. 04/29/13    P , MD  aspirin 81 MG tablet Take 81 mg by mouth daily.    Historical Provider, MD  benazepril (LOTENSIN) 40 MG tablet TAKE ONE TABLET BY MOUTH ONCE DAILY     P , MD  febuxostat (ULORIC) 40 MG tablet Take 2 tablets (80 mg total) by mouth daily. 05/26/13    P , MD  finasteride (PROSCAR) 5 MG tablet Take 1 tablet (5 mg total) by mouth daily. 04/29/13    P , MD  furosemide (LASIX) 40 MG tablet Take 1 tablet (40 mg total) by mouth 2 (  two) times daily. 04/29/13   Vernie Shanks, MD  metFORMIN (GLUCOPHAGE) 500 MG tablet Take 1 tablet (500 mg total) by mouth 2 (two) times daily with a meal. 07/29/13   Vernie Shanks, MD  metoprolol (LOPRESSOR) 50 MG tablet Take 1 tablet (50 mg total) by mouth daily. 04/29/13   Vernie Shanks, MD  metoprolol succinate (TOPROL-XL) 100 MG 24 hr tablet Take 1 tablet (100 mg total) by mouth 1 day or 1 dose. 04/29/13   Vernie Shanks, MD  niacin (NIASPAN) 1000 MG CR tablet Take 1 tablet (1,000 mg total) by mouth at bedtime. 07/29/13   Vernie Shanks, MD  pantoprazole (PROTONIX) 40 MG tablet Take 1 tablet (40 mg total) by mouth daily. 08/04/13   Chipper Herb, MD  pravastatin (PRAVACHOL) 40 MG tablet  Take 1 tablet (40 mg total) by mouth daily. 07/29/13   Vernie Shanks, MD  traMADol-acetaminophen (ULTRACET) 37.5-325 MG per tablet Take 1 tablet by mouth every 6 (six) hours as needed. 12/30/13   Shanda Howells, MD     ROS: As above in the HPI. All other systems are stable or negative.  OBJECTIVE: APPEARANCE:  Patient in no acute distress.The patient appeared well nourished and normally developed. Acyanotic. Waist: VITAL SIGNS:BP 140/80  Pulse 48  Temp(Src) 98.9 F (37.2 C) (Oral)  Ht 5' 6" (1.676 m)  Wt 185 lb 3.2 oz (84.006 kg)  BMI 29.91 kg/m2 AAM Overweight/obese  SKIN: warm and  Dry without overt rashes, tattoos and scars  HEAD and Neck: without JVD, Head and scalp: normal Eyes:No scleral icterus. Fundi normal, eye movements normal. Ears: Auricle normal, canal normal, Tympanic membranes normal, insufflation normal. Nose: normal Throat: normal Neck & thyroid: normal  CHEST & LUNGS: Chest wall: normal Lungs: Clear  CVS: Reveals the PMI to be normally located. Regular rhythm, First and Second Heart sounds are normal,  absence of murmurs, rubs or gallops. Peripheral vasculature: Radial pulses: normal Dorsal pedis pulses: normal Posterior pulses: normal  ABDOMEN:  Appearance:Obese Benign, no organomegaly, no masses, no Abdominal Aortic enlargement. No Guarding , no rebound. No Bruits. Bowel sounds: normal  RECTAL: N/A GU: N/A  EXTREMETIES: nonedematous.  MUSCULOSKELETAL:  Spine: normal Joints: intact  NEUROLOGIC: oriented to time,place and person; nonfocal. Strength is normal Sensory is normal Reflexes are normal Cranial Nerves are normal.  ASSESSMENT:  BPH (benign prostatic hyperplasia) - Plan: finasteride (PROSCAR) 5 MG tablet  Gout - Plan: febuxostat (ULORIC) 40 MG tablet, Uric acid  HLD (hyperlipidemia) - Plan: niacin (NIASPAN) 1000 MG CR tablet, CMP14+EGFR, Lipid panel  HTN (hypertension) - Plan: amLODipine (NORVASC) 10 MG tablet,  furosemide (LASIX) 40 MG tablet, metoprolol succinate (TOPROL-XL) 100 MG 24 hr tablet, CMP14+EGFR  Obesity, unspecified  Type II or unspecified type diabetes mellitus without mention of complication, not stated as uncontrolled - Plan: metFORMIN (GLUCOPHAGE) 500 MG tablet, POCT glycosylated hemoglobin (Hb A1C)  ED (erectile dysfunction)  Prostate cancer  PLAN: Purine restricted  Diet and DASH diet in the AVS. Weight reduction Exercise :Lifestyle changes  Discussed.  Orders Placed This Encounter  Procedures  . CMP14+EGFR  . Lipid panel  . Uric acid  . POCT glycosylated hemoglobin (Hb A1C)   Meds ordered this encounter  Medications  . amLODipine (NORVASC) 10 MG tablet    Sig: Take 1 tablet (10 mg total) by mouth daily.    Dispense:  30 tablet    Refill:  11  . benazepril (LOTENSIN) 40 MG tablet  Sig: TAKE ONE TABLET BY MOUTH ONCE DAILY    Dispense:  30 tablet    Refill:  11  . febuxostat (ULORIC) 40 MG tablet    Sig: Take 2 tablets (80 mg total) by mouth daily.    Dispense:  60 tablet    Refill:  11  . finasteride (PROSCAR) 5 MG tablet    Sig: Take 1 tablet (5 mg total) by mouth daily.    Dispense:  30 tablet    Refill:  11  . furosemide (LASIX) 40 MG tablet    Sig: Take 1 tablet (40 mg total) by mouth 2 (two) times daily.    Dispense:  60 tablet    Refill:  11  . metFORMIN (GLUCOPHAGE) 500 MG tablet    Sig: Take 1 tablet (500 mg total) by mouth 2 (two) times daily with a meal.    Dispense:  60 tablet    Refill:  11  . metoprolol succinate (TOPROL-XL) 100 MG 24 hr tablet    Sig: Take 1 tablet (100 mg total) by mouth 1 day or 1 dose.    Dispense:  30 tablet    Refill:  11  . niacin (NIASPAN) 1000 MG CR tablet    Sig: Take 1 tablet (1,000 mg total) by mouth at bedtime.    Dispense:  30 tablet    Refill:  11   Medications Discontinued During This Encounter  Medication Reason  . amLODipine (NORVASC) 10 MG tablet Reorder  . benazepril (LOTENSIN) 40 MG tablet  Reorder  . febuxostat (ULORIC) 40 MG tablet Reorder  . finasteride (PROSCAR) 5 MG tablet Reorder  . furosemide (LASIX) 40 MG tablet Reorder  . metFORMIN (GLUCOPHAGE) 500 MG tablet Reorder  . metoprolol succinate (TOPROL-XL) 100 MG 24 hr tablet Reorder  . niacin (NIASPAN) 1000 MG CR tablet Reorder  . metoprolol (LOPRESSOR) 50 MG tablet Dose change   Return in about 3 months (around 07/14/2014) for Recheck medical problems.  Veron Senner P. Jacelyn Grip, M.D.

## 2014-04-14 LAB — LIPID PANEL
Chol/HDL Ratio: 3.8 ratio units (ref 0.0–5.0)
Cholesterol, Total: 122 mg/dL (ref 100–199)
HDL: 32 mg/dL — ABNORMAL LOW (ref >39)
LDL Calculated: 41 mg/dL (ref 0–99)
Triglycerides: 244 mg/dL — ABNORMAL HIGH (ref 0–149)
VLDL Cholesterol Cal: 49 mg/dL — ABNORMAL HIGH (ref 5–40)

## 2014-04-14 LAB — CMP14+EGFR
ALT: 18 IU/L (ref 0–44)
AST: 26 IU/L (ref 0–40)
Albumin/Globulin Ratio: 1.5 (ref 1.1–2.5)
Albumin: 4.5 g/dL (ref 3.5–4.8)
Alkaline Phosphatase: 56 IU/L (ref 39–117)
BUN/Creatinine Ratio: 14 (ref 10–22)
BUN: 15 mg/dL (ref 8–27)
CO2: 25 mmol/L (ref 18–29)
Calcium: 9.9 mg/dL (ref 8.6–10.2)
Chloride: 99 mmol/L (ref 97–108)
Creatinine, Ser: 1.04 mg/dL (ref 0.76–1.27)
GFR calc Af Amer: 83 mL/min/{1.73_m2} (ref >59)
GFR calc non Af Amer: 72 mL/min/{1.73_m2} (ref >59)
Globulin, Total: 3 g/dL (ref 1.5–4.5)
Glucose: 108 mg/dL — ABNORMAL HIGH (ref 65–99)
Potassium: 3.8 mmol/L (ref 3.5–5.2)
Sodium: 139 mmol/L (ref 134–144)
Total Bilirubin: 1 mg/dL (ref 0.0–1.2)
Total Protein: 7.5 g/dL (ref 6.0–8.5)

## 2014-04-14 LAB — URIC ACID: Uric Acid: 6.3 mg/dL (ref 3.7–8.6)

## 2014-04-17 ENCOUNTER — Telehealth: Payer: Self-pay | Admitting: Family Medicine

## 2014-04-17 NOTE — Telephone Encounter (Signed)
Message copied by Waverly Ferrari on Fri Apr 17, 2014 11:09 AM ------      Message from: Vernie Shanks      Created: Thu Apr 16, 2014  4:41 PM       Call Patient      Lab result at or close to goal.the triglycerides are high.need to reduce the carbs/sugars/fruits/juices.      No change in Medications for now.      No Change in recommendations.      No change in plans for follow up. ------

## 2014-04-22 NOTE — Telephone Encounter (Signed)
Patient aware of results.

## 2014-05-06 ENCOUNTER — Telehealth: Payer: Self-pay | Admitting: *Deleted

## 2014-05-06 NOTE — Telephone Encounter (Signed)
Metoprolol tartrate is a twice a day medication, metoprolol succinate is a once a day medication.  Reviewed Dr Jodi Mourning notes.  In medications prior to admission lists metoprolol tartrate 50mg  1 tablet daily but in his assessment lists metoprolol succinate 100mg  daily.  BP was slightly elevated at that visit but HR was low at 48.  I would recommend that patient take metoprolol succinate 100mg  1/2 tablet once daily with food.  He will be getting same amount of medication but in a time release form (it is ok to split metoprolol succinate even though it is time release).  I tried to call patient but no answer - left message to call office.

## 2014-05-06 NOTE — Telephone Encounter (Signed)
Patient states that he has been taking metoprolol tartrate 50mg  qd. Metoprolol succinate was refilled for him the first of May. Patient of Dr Jacelyn Grip. Please advise on refill

## 2014-05-06 NOTE — Telephone Encounter (Signed)
Please see above note

## 2014-05-06 NOTE — Telephone Encounter (Signed)
Tammy, would you address this and please call patient and let him know.

## 2014-05-18 NOTE — Telephone Encounter (Signed)
I have not been able to get in touch with patient so I called [facility] to see if they had an alternate number that we could reach patient .  They has same home number.  The pharmacist also stated that the patient must have been mistaken about metoprolol because their records indicate that he has been taking metoprolol succinate 100mg  daily since May of 2014.

## 2014-05-18 NOTE — Telephone Encounter (Signed)
Made appt for patient to come in Friday, June 12th to review medications and do [facility]

## 2014-05-22 ENCOUNTER — Ambulatory Visit (INDEPENDENT_AMBULATORY_CARE_PROVIDER_SITE_OTHER): Payer: Medicare Other | Admitting: Pharmacist

## 2014-05-22 VITALS — BP 140/70 | HR 50 | Ht 66.0 in | Wt 184.0 lb

## 2014-05-22 DIAGNOSIS — Z0000 Encounter for general adult medical examination without abnormal findings: Secondary | ICD-10-CM

## 2014-05-22 NOTE — Patient Instructions (Signed)
Health Maintenance Summary    OPHTHALMOLOGY EXAM Postponed 06/19/2014 Due now - last was 2014    ZOSTAVAX Postponed 07/17/2014 Checking on price today    INFLUENZA VACCINE Next Due 07/11/2014  Last was 10/02/2013    HEMOGLOBIN A1C Next Due 10/14/2014  Last was 04/13/2014 5.9%    URINE MICROALBUMIN Next Due 11/05/2014  Last was 10/2013 - normal    FOOT EXAM Next Due 04/14/2015  Last was 04/13/2014    COLONOSCOPY Next Due 08/23/2016  Last was 08/24/2011    TETANUS/TDAP Next Due 12/11/2022  Last was 12/11/2012       Fall Prevention and Home Safety Falls cause injuries and can affect all age groups. It is possible to use preventive measures to significantly decrease the likelihood of falls. There are many simple measures which can make your home safer and prevent falls. OUTDOORS  Repair cracks and edges of walkways and driveways.  Remove high doorway thresholds.  Trim shrubbery on the main path into your home.  Have good outside lighting.  Clear walkways of tools, rocks, debris, and clutter.  Check that handrails are not broken and are securely fastened. Both sides of steps should have handrails.  Have leaves, snow, and ice cleared regularly.  Use sand or salt on walkways during winter months.  In the garage, clean up grease or oil spills. BATHROOM  Install night lights.  Install grab bars by the toilet and in the tub and shower.  Use non-skid mats or decals in the tub or shower.  Place a plastic non-slip stool in the shower to sit on, if needed.  Keep floors dry and clean up all water on the floor immediately.  Remove soap buildup in the tub or shower on a regular basis.  Secure bath mats with non-slip, double-sided rug tape.  Remove throw rugs and tripping hazards from the floors. BEDROOMS  Install night lights.  Make sure a bedside light is easy to reach.  Do not use oversized bedding.  Keep a telephone by your bedside.  Have a firm chair with side arms to use for  getting dressed.  Remove throw rugs and tripping hazards from the floor. KITCHEN  Keep handles on pots and pans turned toward the center of the stove. Use back burners when possible.  Clean up spills quickly and allow time for drying.  Avoid walking on wet floors.  Avoid hot utensils and knives.  Position shelves so they are not too high or low.  Place commonly used objects within easy reach.  If necessary, use a sturdy step stool with a grab bar when reaching.  Keep electrical cables out of the way.  Do not use floor polish or wax that makes floors slippery. If you must use wax, use non-skid floor wax.  Remove throw rugs and tripping hazards from the floor. STAIRWAYS  Never leave objects on stairs.  Place handrails on both sides of stairways and use them. Fix any loose handrails. Make sure handrails on both sides of the stairways are as long as the stairs.  Check carpeting to make sure it is firmly attached along stairs. Make repairs to worn or loose carpet promptly.  Avoid placing throw rugs at the top or bottom of stairways, or properly secure the rug with carpet tape to prevent slippage. Get rid of throw rugs, if possible.  Have an electrician put in a light switch at the top and bottom of the stairs. OTHER FALL PREVENTION TIPS  Wear low-heel or rubber-soled shoes  that are supportive and fit well. Wear closed toe shoes.  When using a stepladder, make sure it is fully opened and both spreaders are firmly locked. Do not climb a closed stepladder.  Add color or contrast paint or tape to grab bars and handrails in your home. Place contrasting color strips on first and last steps.  Learn and use mobility aids as needed. Install an electrical emergency response system.  Turn on lights to avoid dark areas. Replace light bulbs that burn out immediately. Get light switches that glow.  Arrange furniture to create clear pathways. Keep furniture in the same place.  Firmly  attach carpet with non-skid or double-sided tape.  Eliminate uneven floor surfaces.  Select a carpet pattern that does not visually hide the edge of steps.  Be aware of all pets. OTHER HOME SAFETY TIPS  Set the water temperature for 120 F (48.8 C).  Keep emergency numbers on or near the telephone.  Keep smoke detectors on every level of the home and near sleeping areas. Document Released: 11/17/2002 Document Revised: 05/28/2012 Document Reviewed: 02/16/2012 [facility] Patient Information 2014 [place].   Preventive Care for Adults, Male A healthy lifestyle and preventive care can promote health and wellness. Preventive health guidelines for men include the following key practices:  A routine yearly physical is a good way to check with your health care provider about your health and preventative screening. It is a chance to share any concerns and updates on your health and to receive a thorough exam.  Visit your dentist for a routine exam and preventative care every 6 months. Brush your teeth twice a day and floss once a day. Good oral hygiene prevents tooth decay and gum disease.  The frequency of eye exams is based on your age, health, family medical history, use of contact lenses, and other factors. Follow your health care provider's recommendations for frequency of eye exams.  Eat a healthy diet. Foods such as vegetables, fruits, whole grains, low-fat dairy products, and lean protein foods contain the nutrients you need without too many calories. Decrease your intake of foods high in solid fats, added sugars, and salt. Eat the right amount of calories for you.Get information about a proper diet from your health care provider, if necessary.  Regular physical exercise is one of the most important things you can do for your health. Most adults should get at least 150 minutes of moderate-intensity exercise (any activity that increases your heart rate and causes you to sweat) each  week. In addition, most adults need muscle-strengthening exercises on 2 or more days a week.  Maintain a healthy weight. The body mass index (BMI) is a screening tool to identify possible weight problems. It provides an estimate of body fat based on height and weight. Your health care provider can find your BMI and can help you achieve or maintain a healthy weight.For adults 20 years and older:  A BMI below 18.5 is considered underweight.  A BMI of 18.5 to 24.9 is normal.  A BMI of 25 to 29.9 is considered overweight.  A BMI of 30 and above is considered obese.  Maintain normal blood lipids and cholesterol levels by exercising and minimizing your intake of saturated fat. Eat a balanced diet with plenty of fruit and vegetables. Blood tests for lipids and cholesterol should begin at age 71 and be repeated every 5 years. If your lipid or cholesterol levels are high, you are over 50, or you are at high risk for  heart disease, you may need your cholesterol levels checked more frequently.Ongoing high lipid and cholesterol levels should be treated with medicines if diet and exercise are not working.  If you smoke, find out from your health care provider how to quit. If you do not use tobacco, do not start.  Lung cancer screening is recommended for adults aged 61 80 years who are at high risk for developing lung cancer because of a history of smoking. A yearly low-dose CT scan of the lungs is recommended for people who have at least a 30-pack-year history of smoking and are a current smoker or have quit within the past 15 years. A pack year of smoking is smoking an average of 1 pack of cigarettes a day for 1 year (for example: 1 pack a day for 30 years or 2 packs a day for 15 years). Yearly screening should continue until the smoker has stopped smoking for at least 15 years. Yearly screening should be stopped for people who develop a health problem that would prevent them from having lung cancer  treatment.  If you choose to drink alcohol, do not have more than 2 drinks per day. One drink is considered to be 12 ounces (355 mL) of beer, 5 ounces (148 mL) of wine, or 1.5 ounces (44 mL) of liquor.  Avoid use of street drugs. Do not share needles with anyone. Ask for help if you need support or instructions about stopping the use of drugs.  High blood pressure causes heart disease and increases the risk of stroke. Your blood pressure should be checked at least every 1 2 years. Ongoing high blood pressure should be treated with medicines, if weight loss and exercise are not effective.  If you are 21 71 years old, ask your health care provider if you should take aspirin to prevent heart disease.  Diabetes screening involves taking a blood sample to check your fasting blood sugar level. This should be done once every 3 years, after age 50, if you are within normal weight and without risk factors for diabetes. Testing should be considered at a younger age or be carried out more frequently if you are overweight and have at least 1 risk factor for diabetes.  Colorectal cancer can be detected and often prevented. Most routine colorectal cancer screening begins at the age of 24 and continues through age 52. However, your health care provider may recommend screening at an earlier age if you have risk factors for colon cancer. On a yearly basis, your health care provider may provide home test kits to check for hidden blood in the stool. Use of a small camera at the end of a tube to directly examine the colon (sigmoidoscopy or colonoscopy) can detect the earliest forms of colorectal cancer. Talk to your health care provider about this at age 21, when routine screening begins. Direct exam of the colon should be repeated every 5 10 years through age 76, unless early forms of precancerous polyps or small growths are found.  People who are at an increased risk for hepatitis B should be screened for this virus.  You are considered at high risk for hepatitis B if:  You were born in a country where hepatitis B occurs often. Talk with your health care provider about which countries are considered high-risk.  Your parents were born in a high-risk country and you have not received a shot to protect against hepatitis B (hepatitis B vaccine).  You have HIV or AIDS.  You  use needles to inject street drugs.  You live with, or have sex with, someone who has hepatitis B.  You are a man who has sex with other men (MSM).  You get hemodialysis treatment.  You take certain medicines for conditions such as cancer, organ transplantation, and autoimmune conditions.  Hepatitis C blood testing is recommended for all people born from [birth year] through 1963 and any individual with known risks for hepatitis C.  Practice safe sex. Use condoms and avoid high-risk sexual practices to reduce the spread of sexually transmitted infections (STIs). STIs include gonorrhea, chlamydia, syphilis, trichomonas, herpes, HPV, and human immunodeficiency virus (HIV). Herpes, HIV, and HPV are viral illnesses that have no cure. They can result in disability, cancer, and death.  A one-time screening for abdominal aortic aneurysm (AAA) and surgical repair of large AAAs by ultrasound are recommended for men ages 73 to 10 years who are current or former smokers.  Healthy men should no longer receive prostate-specific antigen (PSA) blood tests as part of routine cancer screening. Talk with your health care provider about prostate cancer screening.  Testicular cancer screening is not recommended for adult males who have no symptoms. Screening includes self-exam, a health care provider exam, and other screening tests. Consult with your health care provider about any symptoms you have or any concerns you have about testicular cancer.  Use sunscreen. Apply sunscreen liberally and repeatedly throughout the day. You should seek shade when your shadow is  shorter than you. Protect yourself by wearing long sleeves, pants, a wide-brimmed hat, and sunglasses year round, whenever you are outdoors.  Once a month, do a whole-body skin exam, using a mirror to look at the skin on your back. Tell your health care provider about new moles, moles that have irregular borders, moles that are larger than a pencil eraser, or moles that have changed in shape or color.  Stay current with required vaccines (immunizations).  Influenza vaccine. All adults should be immunized every year.  Tetanus, diphtheria, and acellular pertussis (Td, Tdap) vaccine. An adult who has not previously received Tdap or who does not know his vaccine status should receive 1 dose of Tdap. This initial dose should be followed by tetanus and diphtheria toxoids (Td) booster doses every 10 years. Adults with an unknown or incomplete history of completing a 3-dose immunization series with Td-containing vaccines should begin or complete a primary immunization series including a Tdap dose. Adults should receive a Td booster every 10 years.  Varicella vaccine. An adult without evidence of immunity to varicella should receive 2 doses or a second dose if he has previously received 1 dose.  Human papillomavirus (HPV) vaccine. Males aged 18 21 years who have not received the vaccine previously should receive the 3-dose series. Males aged 34 26 years may be immunized. Immunization is recommended through the age of 63 years for any male who has sex with males and did not get any or all doses earlier. Immunization is recommended for any person with an immunocompromised condition through the age of 92 years if he did not get any or all doses earlier. During the 3-dose series, the second dose should be obtained 4 8 weeks after the first dose. The third dose should be obtained 24 weeks after the first dose and 16 weeks after the second dose.  Zoster vaccine. One dose is recommended for adults aged 18 years or  older unless certain conditions are present.  Measles, mumps, and rubella (MMR) vaccine. Adults born before [birth year]  generally are considered immune to measles and mumps. Adults born in [birth year] or later should have 1 or more doses of MMR vaccine unless there is a contraindication to the vaccine or there is laboratory evidence of immunity to each of the three diseases. A routine second dose of MMR vaccine should be obtained at least 28 days after the first dose for students attending postsecondary schools, health care workers, or international travelers. People who received inactivated measles vaccine or an unknown type of measles vaccine during 1963 1967 should receive 2 doses of MMR vaccine. People who received inactivated mumps vaccine or an unknown type of mumps vaccine before 1979 and are at high risk for mumps infection should consider immunization with 2 doses of MMR vaccine. Unvaccinated health care workers born before [birth year] who lack laboratory evidence of measles, mumps, or rubella immunity or laboratory confirmation of disease should consider measles and mumps immunization with 2 doses of MMR vaccine or rubella immunization with 1 dose of MMR vaccine.  Pneumococcal 13-valent conjugate (PCV13) vaccine. When indicated, a person who is uncertain of his immunization history and has no record of immunization should receive the PCV13 vaccine. An adult aged 56 years or older who has certain medical conditions and has not been previously immunized should receive 1 dose of PCV13 vaccine. This PCV13 should be followed with a dose of pneumococcal polysaccharide (PPSV23) vaccine. The PPSV23 vaccine dose should be obtained at least 8 weeks after the dose of PCV13 vaccine. An adult aged 41 years or older who has certain medical conditions and previously received 1 or more doses of PPSV23 vaccine should receive 1 dose of PCV13. The PCV13 vaccine dose should be obtained 1 or more years after the last PPSV23 vaccine  dose.  Pneumococcal polysaccharide (PPSV23) vaccine. When PCV13 is also indicated, PCV13 should be obtained first. All adults aged 67 years and older should be immunized. An adult younger than age 33 years who has certain medical conditions should be immunized. Any person who resides in a nursing home or long-term care facility should be immunized. An adult smoker should be immunized. People with an immunocompromised condition and certain other conditions should receive both PCV13 and PPSV23 vaccines. People with human immunodeficiency virus (HIV) infection should be immunized as soon as possible after diagnosis. Immunization during chemotherapy or radiation therapy should be avoided. Routine use of PPSV23 vaccine is not recommended for American Indians, [place] Natives, or people younger than 65 years unless there are medical conditions that require PPSV23 vaccine. When indicated, people who have unknown immunization and have no record of immunization should receive PPSV23 vaccine. One-time revaccination 5 years after the first dose of PPSV23 is recommended for people aged 58 64 years who have chronic kidney failure, nephrotic syndrome, asplenia, or immunocompromised conditions. People who received 1 2 doses of PPSV23 before age 77 years should receive another dose of PPSV23 vaccine at age 42 years or later if at least 5 years have passed since the previous dose. Doses of PPSV23 are not needed for people immunized with PPSV23 at or after age 26 years.  Meningococcal vaccine. Adults with asplenia or persistent complement component deficiencies should receive 2 doses of quadrivalent meningococcal conjugate (MenACWY-D) vaccine. The doses should be obtained at least 2 months apart. Microbiologists working with certain meningococcal bacteria, [employer] recruits, people at risk during an outbreak, and people who travel to or live in countries with a high rate of meningitis should be immunized. A first-year college  student up through age  21 years who is living in a residence hall should receive a dose if he did not receive a dose on or after his 16th birthday. Adults who have certain high-risk conditions should receive one or more doses of vaccine.  Hepatitis A vaccine. Adults who wish to be protected from this disease, have certain high-risk conditions, work with hepatitis A-infected animals, work in hepatitis A research labs, or travel to or work in countries with a high rate of hepatitis A should be immunized. Adults who were previously unvaccinated and who anticipate close contact with an international adoptee during the first 60 days after arrival in the [place] from a country with a high rate of hepatitis A should be immunized.  Hepatitis B vaccine. Adults who wish to be protected from this disease, have certain high-risk conditions, may be exposed to blood or other infectious body fluids, are household contacts or sex partners of hepatitis B positive people, are clients or workers in certain care facilities, or travel to or work in countries with a high rate of hepatitis B should be immunized.

## 2014-05-22 NOTE — Progress Notes (Signed)
Subjective:    Alan [name] is a 73 y.o. male who presents for Medicare Initial Wellness Visit and blood pressure check .   Recently there was some confusion about what the prescribed dose of metoprolol was for Alan [name].  During visit today Alan [name] sstates that when he last saw Alan [name] he was taking both metoprolol tartrate 50mg  1 tablet daily and metoprolol succinate ER 100mg  1 tablet daily.  He stopped the 50mg  and currently is only taking metoprolol succinate 100mg  1 tablet daily.  In addition HR was a little low at 48 at his last visit with Alan [name].  Patient denies dizziness, lightheadedness or s/s of orthostatic hypotension.  Also denies SOB or limited activity. Will reassess today.   Preventive Screening-Counseling & Management  Tobacco History  Smoking status  . Former Smoker  . Types: Cigarettes  . Quit date: 12/11/1986  Smokeless tobacco  . Never Used   Current Problems (verified) Patient Active Problem List   Diagnosis Date Noted  . ED (erectile dysfunction)   . Prostate cancer   . HTN (hypertension) 04/29/2013  . HLD (hyperlipidemia) 04/29/2013  . Type II or unspecified type diabetes mellitus without mention of complication, not stated as uncontrolled 04/29/2013  . Gout 04/29/2013  . Obesity, unspecified 04/29/2013  . BPH (benign prostatic hyperplasia) 04/29/2013    Medications Prior to Visit Current Outpatient Prescriptions on File Prior to Visit  Medication Sig Dispense Refill  . aspirin 81 MG tablet Take 81 mg by mouth every evening.       . benazepril (LOTENSIN) 40 MG tablet TAKE ONE TABLET BY MOUTH ONCE DAILY  30 tablet  11  . febuxostat (ULORIC) 40 MG tablet Take 2 tablets (80 mg total) by mouth daily.  60 tablet  11  . finasteride (PROSCAR) 5 MG tablet Take 1 tablet (5 mg total) by mouth daily.  30 tablet  11  . metFORMIN (GLUCOPHAGE) 500 MG tablet Take 1 tablet (500 mg total) by mouth 2 (two) times daily with a meal.  60 tablet  11  . metoprolol succinate  (TOPROL-XL) 100 MG 24 hr tablet Take 1 tablet (100 mg total) by mouth 1 day or 1 dose.  30 tablet  11  . niacin (NIASPAN) 1000 MG CR tablet Take 1 tablet (1,000 mg total) by mouth at bedtime.  30 tablet  11  . pantoprazole (PROTONIX) 40 MG tablet Take 1 tablet (40 mg total) by mouth daily.  30 tablet  3   No current facility-administered medications on file prior to visit.    Current Medications (verified) Current Outpatient Prescriptions  Medication Sig Dispense Refill  . amLODipine (NORVASC) 10 MG tablet Take 10 mg by mouth every evening.      Marland Kitchen aspirin 81 MG tablet Take 81 mg by mouth every evening.       . benazepril (LOTENSIN) 40 MG tablet TAKE ONE TABLET BY MOUTH ONCE DAILY  30 tablet  11  . febuxostat (ULORIC) 40 MG tablet Take 2 tablets (80 mg total) by mouth daily.  60 tablet  11  . finasteride (PROSCAR) 5 MG tablet Take 1 tablet (5 mg total) by mouth daily.  30 tablet  11  . furosemide (LASIX) 40 MG tablet Take 40 mg by mouth daily.      . metFORMIN (GLUCOPHAGE) 500 MG tablet Take 1 tablet (500 mg total) by mouth 2 (two) times daily with a meal.  60 tablet  11  . metoprolol succinate (TOPROL-XL) 100 MG 24  hr tablet Take 1 tablet (100 mg total) by mouth 1 day or 1 dose.  30 tablet  11  . Multiple Vitamins-Minerals (MULTIVITAMIN PO) Take 1 tablet by mouth daily.      . niacin (NIASPAN) 1000 MG CR tablet Take 1 tablet (1,000 mg total) by mouth at bedtime.  30 tablet  11  . pantoprazole (PROTONIX) 40 MG tablet Take 1 tablet (40 mg total) by mouth daily.  30 tablet  3  . pravastatin (PRAVACHOL) 40 MG tablet Take 40 mg by mouth every evening.       No current facility-administered medications for this visit.     Allergies (verified) Allopurinol and Colchicine   PAST HISTORY  Family History Family History  Problem Relation Age of Onset  . Stroke Mother   . Hypertension Mother   . Alzheimer's disease Mother   . Hypertension Father   . Cancer Sister     brain  . Cancer Sister      Social History History  Substance Use Topics  . Smoking status: Former Smoker    Types: Cigarettes    Quit date: 12/11/1986  . Smokeless tobacco: Never Used  . Alcohol Use: No    Are there smokers in your home (other than you)?  No  Risk Factors Current exercise habits: Home exercise routine includes stay active in garden and walking but not regular.  Dietary issues discussed: CHO serving sizes discussed   Cardiac risk factors: advanced age (older than 78 for men, 41 for women), diabetes mellitus, dyslipidemia, family history of premature cardiovascular disease, hypertension and male gender.  Depression Screen (Note: if answer to either of the following is "Yes", a more complete depression screening is indicated)   Q1: Over the past two weeks, have you felt down, depressed or hopeless? No  Q2: Over the past two weeks, have you felt little interest or pleasure in doing things? No  Have you lost interest or pleasure in daily life? No  Do you often feel hopeless? No  Do you cry easily over simple problems? No  Activities of Daily Living In your present state of health, do you have any difficulty performing the following activities?:  Driving? No Managing money?  No Feeding yourself? No Getting from bed to chair? No Climbing a flight of stairs? No Preparing food and eating?: No Bathing or showering? No Getting dressed: No Getting to the toilet? No Using the toilet:No Moving around from place to place: No In the past year have you fallen or had a near fall?:Yes   Are you sexually active?  Yes  Do you have more than one partner?  No  Hearing Difficulties: No Do you often ask people to speak up or repeat themselves? No Do you experience ringing or noises in your ears? No Do you have difficulty understanding soft or whispered voices? No   Do you feel that you have a problem with memory? No  Do you often misplace items? No  Do you feel safe at home?  Yes  Cognitive  Testing  Alert? Yes  Normal Appearance?Yes  Oriented to person? Yes  Place? Yes   Time? Yes  Recall of three objects?  Yes  Can perform simple calculations? Yes  Displays appropriate judgment?Yes  Can read the correct time from a watch face?Yes   Advanced Directives have been discussed with the patient? Yes   List the Names of Other Physician/Practitioners you currently use: 1.  Urologist - Alan Jeffie Pollock 2.  Optometrist -  Okey Regal, OD  Indicate any recent Medical Services you may have received from other than [facility] providers in the past year (date may be approximate).  Immunization History  Administered Date(s) Administered  . Influenza,inj,Quad PF,36+ Mos 10/02/2013    Screening Tests Health Maintenance  Topic Date Due  . Ophthalmology Exam  06/19/2014 (Originally 01/20/1951)  . Zostavax  07/17/2014 (Originally 01/20/2003)  . Influenza Vaccine  07/11/2014  . Hemoglobin A1c  10/14/2014  . Urine Microalbumin  11/05/2014  . Foot Exam  04/14/2015  . Colonoscopy  08/23/2016  . Tetanus/tdap  12/11/2022  . Pneumococcal Polysaccharide Vaccine Age 57 And Over  Completed    All answers were reviewed with the patient and necessary referrals were made:  Cherre Robins, [facility]   05/22/2014   History reviewed: allergies, current medications, past family history, past medical history, past social history, past surgical history and problem list  Objective:      Blood pressure 140/70, pulse 50, height 5\' 6"  (1.676 m), weight 184 lb (83.462 kg). Body mass index is 29.71 kg/(m^2).  Assessment:     Initial Annual Medicare Wellness Visit Hypertension / low Heart rate - SBP on upper end of normal today - HR a little low but patient w/o complaints     Plan:     During the course of the visit the patient was educated and counseled about appropriate screening and preventive services including:    Pneumococcal vaccine   Influenza vaccine  Hepatitis B vaccine  Td  vaccine  Screening electrocardiogram  Prostate cancer screening  Colorectal cancer screening  Glaucoma screening  Advanced directives: given [facility] packet  Patient to make appt to have comprehensive eye exam  Checked on price of Zostavax - $99.76 if gets today - patient chose to postpone due to cost.  Brochure about Shingles and vaccine given to patient.  Patient Instructions (the written plan) was given to the patient.  Medicare Attestation I have personally reviewed: The patient's medical and social history Their use of alcohol, tobacco or illicit drugs Their current medications and supplements The patient's functional ability including ADLs,fall risks, home safety risks, cognitive, and hearing and visual impairment Diet and physical activities Evidence for depression or mood disorders  The patient's weight, height, BMI, and HTN / HR have been recorded in the chart.  I have made referrals, counseling, and provided education to the patient based on review of the above and I have provided the patient with a written personalized care plan for preventive services.     Cherre Robins, [facility]   05/22/2014

## 2014-06-26 ENCOUNTER — Ambulatory Visit (INDEPENDENT_AMBULATORY_CARE_PROVIDER_SITE_OTHER): Payer: Medicare Other | Admitting: Urology

## 2014-06-26 DIAGNOSIS — C61 Malignant neoplasm of prostate: Secondary | ICD-10-CM

## 2014-06-26 DIAGNOSIS — N138 Other obstructive and reflux uropathy: Secondary | ICD-10-CM

## 2014-06-26 DIAGNOSIS — N401 Enlarged prostate with lower urinary tract symptoms: Secondary | ICD-10-CM

## 2014-07-24 ENCOUNTER — Ambulatory Visit: Payer: Medicare Other | Admitting: Family

## 2014-08-11 ENCOUNTER — Encounter: Payer: Self-pay | Admitting: Family Medicine

## 2014-08-11 ENCOUNTER — Ambulatory Visit (INDEPENDENT_AMBULATORY_CARE_PROVIDER_SITE_OTHER): Payer: Medicare Other | Admitting: Family Medicine

## 2014-08-11 VITALS — BP 170/64 | HR 52 | Temp 97.2°F | Ht 66.0 in | Wt 180.6 lb

## 2014-08-11 DIAGNOSIS — E119 Type 2 diabetes mellitus without complications: Secondary | ICD-10-CM

## 2014-08-11 DIAGNOSIS — I10 Essential (primary) hypertension: Secondary | ICD-10-CM

## 2014-08-11 DIAGNOSIS — E785 Hyperlipidemia, unspecified: Secondary | ICD-10-CM

## 2014-08-11 LAB — POCT CBC
Granulocyte percent: 64.3 %G (ref 37–80)
HCT, POC: 42.2 % — AB (ref 43.5–53.7)
Hemoglobin: 13.7 g/dL — AB (ref 14.1–18.1)
Lymph, poc: 2.1 (ref 0.6–3.4)
MCH, POC: 31.4 pg — AB (ref 27–31.2)
MCHC: 32.5 g/dL (ref 31.8–35.4)
MCV: 96.5 fL (ref 80–97)
MPV: 7.5 fL (ref 0–99.8)
POC Granulocyte: 4.4 (ref 2–6.9)
POC LYMPH PERCENT: 30.8 %L (ref 10–50)
Platelet Count, POC: 247 10*3/uL (ref 142–424)
RBC: 4.4 M/uL — AB (ref 4.69–6.13)
RDW, POC: 13.1 %
WBC: 6.8 10*3/uL (ref 4.6–10.2)

## 2014-08-11 LAB — POCT GLYCOSYLATED HEMOGLOBIN (HGB A1C): Hemoglobin A1C: 5.6

## 2014-08-11 LAB — POCT UA - MICROALBUMIN: Microalbumin Ur, POC: 20 mg/L

## 2014-08-11 MED ORDER — FUROSEMIDE 40 MG PO TABS: ORAL_TABLET | ORAL | Status: DC

## 2014-08-11 NOTE — Addendum Note (Signed)
Addended by: Earlene Plater on: 08/11/2014 10:37 AM   Modules accepted: Orders

## 2014-08-11 NOTE — Progress Notes (Signed)
   Subjective:    Patient ID: Alan [name], male    DOB: [birth year], 73 y.o.   MRN: [id number]  HPI  This 73 y.o. male presents for evaluation of follow up on diabetes, hypertension, and hyperlipidemia.  Review of Systems No chest pain, SOB, HA, dizziness, vision change, N/V, diarrhea, constipation, dysuria, urinary urgency or frequency, myalgias, arthralgias or rash.     Objective:   Physical Exam Vital signs noted  Well developed well nourished male.  HEENT - Head atraumatic Normocephalic                Eyes - PERRLA, Conjuctiva - clear Sclera- Clear EOMI                Ears - EAC's Wnl TM's Wnl Gross Hearing WNL                Throat - oropharanx wnl Respiratory - Lungs CTA bilateral Cardiac - RRR S1 and S2 without murmur.  BP 150/64 GI - Abdomen soft Nontender and bowel sounds active x 4 Extremities - No edema. Neuro - Grossly intact.         Assessment & Plan:  Type 2 diabetes mellitus without complication - Plan: POCT glycosylated hemoglobin (Hb A1C), Lipid panel, POCT UA - Microalbumin.  He has had LDL under control and diabetes has been controlled.  BP is elevated so will increase bp meds and weight is elevated and recommend diet and exercise.  Will repeat urine microalbumin.  Hyperlipemia - Plan: POCT CBC, CMP14+EGFR, Lipid panel  Essential hypertension, benign - Plan: POCT CBC, CMP14+EGFR, Lipid panel, furosemide (LASIX) 40 MG tablet BP is not controlled and increase lasix to one and half po qd.    Follow up in 3 months  Lysbeth Penner FNP

## 2014-08-12 LAB — LIPID PANEL
Chol/HDL Ratio: 3.9 ratio units (ref 0.0–5.0)
Cholesterol, Total: 128 mg/dL (ref 100–199)
HDL: 33 mg/dL — ABNORMAL LOW (ref >39)
LDL Calculated: 65 mg/dL (ref 0–99)
Triglycerides: 151 mg/dL — ABNORMAL HIGH (ref 0–149)
VLDL Cholesterol Cal: 30 mg/dL (ref 5–40)

## 2014-08-12 LAB — CMP14+EGFR
ALT: 13 IU/L (ref 0–44)
AST: 17 IU/L (ref 0–40)
Albumin/Globulin Ratio: 1.5 (ref 1.1–2.5)
Albumin: 4.3 g/dL (ref 3.5–4.8)
Alkaline Phosphatase: 57 IU/L (ref 39–117)
BUN/Creatinine Ratio: 9 — ABNORMAL LOW (ref 10–22)
BUN: 11 mg/dL (ref 8–27)
CO2: 24 mmol/L (ref 18–29)
Calcium: 10.3 mg/dL — ABNORMAL HIGH (ref 8.6–10.2)
Chloride: 98 mmol/L (ref 97–108)
Creatinine, Ser: 1.17 mg/dL (ref 0.76–1.27)
GFR calc Af Amer: 72 mL/min/{1.73_m2} (ref >59)
GFR calc non Af Amer: 62 mL/min/{1.73_m2} (ref >59)
Globulin, Total: 2.9 g/dL (ref 1.5–4.5)
Glucose: 108 mg/dL — ABNORMAL HIGH (ref 65–99)
Potassium: 4.5 mmol/L (ref 3.5–5.2)
Sodium: 139 mmol/L (ref 134–144)
Total Bilirubin: 0.8 mg/dL (ref 0.0–1.2)
Total Protein: 7.2 g/dL (ref 6.0–8.5)

## 2014-08-12 LAB — MICROALBUMIN, URINE: Microalbumin, Urine: 21.8 ug/mL — ABNORMAL HIGH (ref 0.0–17.0)

## 2014-08-19 ENCOUNTER — Telehealth: Payer: Self-pay | Admitting: Family Medicine

## 2014-08-19 NOTE — Telephone Encounter (Signed)
Message copied by Cline Crock on Wed Aug 19, 2014  3:19 PM ------      Message from: Lysbeth Penner      Created: Fri Aug 14, 2014  6:13 PM       UA microalbumin with slightly elevated protien due  To diabetes and already on 40mg  lisinopril so would repeat.  Hgbaic, lipid, renal and liver fx is wnl ------

## 2014-09-04 ENCOUNTER — Other Ambulatory Visit: Payer: Self-pay | Admitting: Pharmacist

## 2014-09-04 DIAGNOSIS — N400 Enlarged prostate without lower urinary tract symptoms: Secondary | ICD-10-CM

## 2014-09-04 DIAGNOSIS — E119 Type 2 diabetes mellitus without complications: Secondary | ICD-10-CM

## 2014-09-04 DIAGNOSIS — I10 Essential (primary) hypertension: Secondary | ICD-10-CM

## 2014-09-04 MED ORDER — METFORMIN HCL 500 MG PO TABS: 500.0000 mg | ORAL_TABLET | Freq: Two times a day (BID) | ORAL | Status: DC

## 2014-09-04 MED ORDER — FUROSEMIDE 40 MG PO TABS: ORAL_TABLET | ORAL | Status: DC

## 2014-09-04 MED ORDER — METOPROLOL SUCCINATE ER 100 MG PO TB24
100.0000 mg | ORAL_TABLET | ORAL | Status: DC
Start: 2014-09-04 — End: 2014-09-07

## 2014-09-04 MED ORDER — PRAVASTATIN SODIUM 40 MG PO TABS: 40.0000 mg | ORAL_TABLET | Freq: Every evening | ORAL | Status: DC

## 2014-09-04 MED ORDER — AMLODIPINE BESYLATE 10 MG PO TABS: 10.0000 mg | ORAL_TABLET | Freq: Every evening | ORAL | Status: DC

## 2014-09-04 MED ORDER — FINASTERIDE 5 MG PO TABS: 5.0000 mg | ORAL_TABLET | Freq: Every day | ORAL | Status: DC

## 2014-09-04 MED ORDER — BENAZEPRIL HCL 40 MG PO TABS: ORAL_TABLET | ORAL | Status: DC

## 2014-09-04 NOTE — Progress Notes (Signed)
Per patient's insurance requested 90 day supply.  Called patinet and he would like 90 day supply sent to [facility].  Done

## 2014-09-07 ENCOUNTER — Other Ambulatory Visit: Payer: Self-pay | Admitting: *Deleted

## 2014-09-07 DIAGNOSIS — I10 Essential (primary) hypertension: Secondary | ICD-10-CM

## 2014-09-07 MED ORDER — METOPROLOL SUCCINATE ER 100 MG PO TB24: 100.0000 mg | ORAL_TABLET | ORAL | Status: DC

## 2014-09-07 NOTE — Telephone Encounter (Signed)
Pharmacy is questioning directions on this med. Sig states Take 1 tablet by mouth 1 Day or 1 Dose. Please clarify and resend so they can dispense med

## 2014-09-10 ENCOUNTER — Encounter: Payer: Self-pay | Admitting: *Deleted

## 2014-11-12 ENCOUNTER — Ambulatory Visit (INDEPENDENT_AMBULATORY_CARE_PROVIDER_SITE_OTHER): Payer: Medicare Other | Admitting: *Deleted

## 2014-11-12 ENCOUNTER — Encounter: Payer: Self-pay | Admitting: Family Medicine

## 2014-11-12 ENCOUNTER — Ambulatory Visit (INDEPENDENT_AMBULATORY_CARE_PROVIDER_SITE_OTHER): Payer: Medicare Other | Admitting: Family Medicine

## 2014-11-12 VITALS — BP 143/65 | HR 68 | Temp 97.4°F | Ht 66.0 in | Wt 180.0 lb

## 2014-11-12 DIAGNOSIS — E119 Type 2 diabetes mellitus without complications: Secondary | ICD-10-CM

## 2014-11-12 DIAGNOSIS — I10 Essential (primary) hypertension: Secondary | ICD-10-CM

## 2014-11-12 DIAGNOSIS — Z23 Encounter for immunization: Secondary | ICD-10-CM

## 2014-11-12 DIAGNOSIS — E785 Hyperlipidemia, unspecified: Secondary | ICD-10-CM

## 2014-11-12 LAB — POCT GLYCOSYLATED HEMOGLOBIN (HGB A1C): Hemoglobin A1C: 5.8

## 2014-11-12 NOTE — Progress Notes (Signed)
   Subjective:    Patient ID: Alan [name], male    DOB: 10-27-1941, 73 y.o.   MRN: [id number]  HPI Patient is here for routine visit.  He has hx of BPH, DM, hyperlipidemia, and HTN.  He is due for lab work.  Review of Systems  Constitutional: Negative for fever.  HENT: Negative for ear pain.   Eyes: Negative for discharge.  Respiratory: Negative for cough.   Cardiovascular: Negative for chest pain.  Gastrointestinal: Negative for abdominal distention.  Endocrine: Negative for polyuria.  Genitourinary: Negative for difficulty urinating.  Musculoskeletal: Negative for gait problem and neck pain.  Skin: Negative for color change and rash.  Neurological: Negative for speech difficulty and headaches.  Psychiatric/Behavioral: Negative for agitation.       Objective:    BP 143/65 mmHg  Pulse 68  Temp(Src) 97.4 F (36.3 C) (Oral)  Ht _0  (1.676 m)  Wt 180 lb (81.647 kg)  BMI 29.07 kg/m2 Physical Exam  Constitutional: He is oriented to person, place, and time. He appears well-developed and well-nourished.  HENT:  Head: Normocephalic and atraumatic.  Mouth/Throat: Oropharynx is clear and moist.  Eyes: Pupils are equal, round, and reactive to light.  Neck: Normal range of motion. Neck supple.  Cardiovascular: Normal rate and regular rhythm.   No murmur heard. Pulmonary/Chest: Effort normal and breath sounds normal.  Abdominal: Soft. Bowel sounds are normal. There is no tenderness.  Neurological: He is alert and oriented to person, place, and time.  Skin: Skin is warm and dry.  Psychiatric: He has a normal mood and affect.          Assessment & Plan:     ICD-9-CM ICD-10-CM   1. Diabetes mellitus type 2, controlled, without complications 673.41 P37.9 POCT glycosylated hemoglobin (Hb A1C)     CMP14+EGFR     PSA, total and free  2. Hyperlipemia 272.4 E78.5 Lipid panel     PSA, total and free  3. Essential hypertension, benign 401.1 I10 POCT CBC     PSA, total and free      Return in about 3 months (around 02/11/2015).  Lysbeth Penner FNP

## 2014-11-12 NOTE — Addendum Note (Signed)
Addended by: Selmer Dominion on: 11/12/2014 12:57 PM   Modules accepted: Orders

## 2014-11-13 LAB — CBC WITH DIFFERENTIAL
Basophils Absolute: 0 10*3/uL (ref 0.0–0.2)
Basos: 0 %
Eos: 4 %
Eosinophils Absolute: 0.2 10*3/uL (ref 0.0–0.4)
HCT: 39.2 % (ref 37.5–51.0)
Hemoglobin: 13.3 g/dL (ref 12.6–17.7)
Immature Grans (Abs): 0 10*3/uL (ref 0.0–0.1)
Immature Granulocytes: 0 %
Lymphocytes Absolute: 2 10*3/uL (ref 0.7–3.1)
Lymphs: 37 %
MCH: 31.9 pg (ref 26.6–33.0)
MCHC: 33.9 g/dL (ref 31.5–35.7)
MCV: 94 fL (ref 79–97)
Monocytes Absolute: 0.5 10*3/uL (ref 0.1–0.9)
Monocytes: 10 %
Neutrophils Absolute: 2.7 10*3/uL (ref 1.4–7.0)
Neutrophils Relative %: 49 %
Platelets: 233 10*3/uL (ref 150–379)
RBC: 4.17 x10E6/uL (ref 4.14–5.80)
RDW: 14.7 % (ref 12.3–15.4)
WBC: 5.3 10*3/uL (ref 3.4–10.8)

## 2014-11-13 LAB — CMP14+EGFR
ALT: 14 [IU]/L (ref 0–44)
AST: 21 [IU]/L (ref 0–40)
Albumin/Globulin Ratio: 1.5 (ref 1.1–2.5)
Albumin: 4.1 g/dL (ref 3.5–4.8)
Alkaline Phosphatase: 51 [IU]/L (ref 39–117)
BUN/Creatinine Ratio: 15 (ref 10–22)
BUN: 19 mg/dL (ref 8–27)
CO2: 26 mmol/L (ref 18–29)
Calcium: 9.9 mg/dL (ref 8.6–10.2)
Chloride: 100 mmol/L (ref 97–108)
Creatinine, Ser: 1.28 mg/dL — ABNORMAL HIGH (ref 0.76–1.27)
GFR calc Af Amer: 65 mL/min/{1.73_m2}
GFR calc non Af Amer: 56 mL/min/{1.73_m2} — ABNORMAL LOW
Globulin, Total: 2.8 g/dL (ref 1.5–4.5)
Glucose: 96 mg/dL (ref 65–99)
Potassium: 3.8 mmol/L (ref 3.5–5.2)
Sodium: 141 mmol/L (ref 134–144)
Total Bilirubin: 1.1 mg/dL (ref 0.0–1.2)
Total Protein: 6.9 g/dL (ref 6.0–8.5)

## 2014-11-13 LAB — PSA, TOTAL AND FREE
PSA, Free Pct: 31 %
PSA, Free: 0.62 ng/mL
PSA: 2 ng/mL (ref 0.0–4.0)

## 2014-11-13 LAB — LIPID PANEL
Chol/HDL Ratio: 3.9 ratio (ref 0.0–5.0)
Cholesterol, Total: 120 mg/dL (ref 100–199)
HDL: 31 mg/dL — ABNORMAL LOW
LDL Calculated: 53 mg/dL (ref 0–99)
Triglycerides: 179 mg/dL — ABNORMAL HIGH (ref 0–149)
VLDL Cholesterol Cal: 36 mg/dL (ref 5–40)

## 2014-11-19 ENCOUNTER — Encounter: Payer: Self-pay | Admitting: *Deleted

## 2014-12-21 DIAGNOSIS — C61 Malignant neoplasm of prostate: Secondary | ICD-10-CM | POA: Diagnosis not present

## 2014-12-25 ENCOUNTER — Ambulatory Visit (INDEPENDENT_AMBULATORY_CARE_PROVIDER_SITE_OTHER): Payer: Medicare Other | Admitting: Urology

## 2014-12-25 DIAGNOSIS — R3912 Poor urinary stream: Secondary | ICD-10-CM

## 2014-12-25 DIAGNOSIS — N401 Enlarged prostate with lower urinary tract symptoms: Secondary | ICD-10-CM | POA: Diagnosis not present

## 2014-12-25 DIAGNOSIS — C61 Malignant neoplasm of prostate: Secondary | ICD-10-CM | POA: Diagnosis not present

## 2014-12-25 DIAGNOSIS — N5201 Erectile dysfunction due to arterial insufficiency: Secondary | ICD-10-CM | POA: Diagnosis not present

## 2015-03-08 ENCOUNTER — Ambulatory Visit (INDEPENDENT_AMBULATORY_CARE_PROVIDER_SITE_OTHER): Payer: Medicare Other | Admitting: Family Medicine

## 2015-03-08 ENCOUNTER — Encounter: Payer: Self-pay | Admitting: Family Medicine

## 2015-03-08 VITALS — BP 146/60 | HR 56 | Temp 97.5°F | Ht 66.0 in | Wt 184.0 lb

## 2015-03-08 DIAGNOSIS — I10 Essential (primary) hypertension: Secondary | ICD-10-CM

## 2015-03-08 DIAGNOSIS — Z23 Encounter for immunization: Secondary | ICD-10-CM

## 2015-03-08 DIAGNOSIS — E119 Type 2 diabetes mellitus without complications: Secondary | ICD-10-CM | POA: Diagnosis not present

## 2015-03-08 DIAGNOSIS — E785 Hyperlipidemia, unspecified: Secondary | ICD-10-CM | POA: Diagnosis not present

## 2015-03-08 DIAGNOSIS — N400 Enlarged prostate without lower urinary tract symptoms: Secondary | ICD-10-CM | POA: Diagnosis not present

## 2015-03-08 LAB — POCT GLYCOSYLATED HEMOGLOBIN (HGB A1C): HEMOGLOBIN A1C: 5.9

## 2015-03-08 MED ORDER — METFORMIN HCL 500 MG PO TABS: 500.0000 mg | ORAL_TABLET | Freq: Two times a day (BID) | ORAL | Status: DC

## 2015-03-08 MED ORDER — PRAVASTATIN SODIUM 40 MG PO TABS: 40.0000 mg | ORAL_TABLET | Freq: Every evening | ORAL | Status: DC

## 2015-03-08 MED ORDER — METOPROLOL SUCCINATE ER 100 MG PO TB24: 100.0000 mg | ORAL_TABLET | ORAL | Status: DC

## 2015-03-08 MED ORDER — FUROSEMIDE 40 MG PO TABS: ORAL_TABLET | ORAL | Status: DC

## 2015-03-08 MED ORDER — BENAZEPRIL HCL 40 MG PO TABS: ORAL_TABLET | ORAL | Status: DC

## 2015-03-08 NOTE — Progress Notes (Signed)
Subjective:    Patient ID: Alan [name]., male    DOB: 12-02-1941, 74 y.o.   MRN: [id number]  HPI 74 year old gentleman here to follow-up diabetes, hypertension, hyperlipidemia. He had labs at his last visit and lipids are at goal.. He denies any side effects from any of his medicine specifically edema with amlodipine, cough with ACE inhibitor. He does get some itching at night when he takes niacin.  He is followed by Dr. ran and he has been told that Proscar is helping to strengthen his prostate and he desires to continue that medicine.  He does not recall his last gout attack so we will continue with your caloric   Patient Active Problem List   Diagnosis Date Noted  . ED (erectile dysfunction)   . Prostate cancer   . HTN (hypertension) 04/29/2013  . HLD (hyperlipidemia) 04/29/2013  . Diabetes 04/29/2013  . Gout 04/29/2013  . Obesity, unspecified 04/29/2013  . BPH (benign prostatic hyperplasia) 04/29/2013   Outpatient Encounter Prescriptions as of 03/08/2015  Medication Sig  . amLODipine (NORVASC) 10 MG tablet Take 1 tablet (10 mg total) by mouth every evening.  Marland Kitchen aspirin 81 MG tablet Take 81 mg by mouth every evening.   . benazepril (LOTENSIN) 40 MG tablet TAKE ONE TABLET BY MOUTH ONCE DAILY  . febuxostat (ULORIC) 40 MG tablet Take 2 tablets (80 mg total) by mouth daily.  . finasteride (PROSCAR) 5 MG tablet Take 1 tablet (5 mg total) by mouth daily.  . furosemide (LASIX) 40 MG tablet Take one and half po qd  . metFORMIN (GLUCOPHAGE) 500 MG tablet Take 1 tablet (500 mg total) by mouth 2 (two) times daily with a meal.  . metoprolol succinate (TOPROL-XL) 100 MG 24 hr tablet Take 1 tablet (100 mg total) by mouth 1 day or 1 dose.  . Multiple Vitamins-Minerals (MULTIVITAMIN PO) Take 1 tablet by mouth daily.  . niacin (NIASPAN) 1000 MG CR tablet Take 1 tablet (1,000 mg total) by mouth at bedtime.  . pravastatin (PRAVACHOL) 40 MG tablet Take 1 tablet (40 mg total) by mouth every  evening.    Review of Systems  Constitutional: Negative.   HENT: Negative.   Eyes: Negative.   Respiratory: Negative.  Negative for shortness of breath.   Cardiovascular: Negative.  Negative for chest pain and leg swelling.  Gastrointestinal: Negative.   Genitourinary: Negative.   Musculoskeletal: Negative.   Skin: Negative.   Neurological: Negative.   Psychiatric/Behavioral: Negative.   All other systems reviewed and are negative.      Objective:   Physical Exam  Constitutional: He is oriented to person, place, and time. He appears well-developed and well-nourished.  HENT:  Head: Normocephalic.  Right Ear: External ear normal.  Left Ear: External ear normal.  Nose: Nose normal.  Mouth/Throat: Oropharynx is clear and moist.  Eyes: Conjunctivae and EOM are normal. Pupils are equal, round, and reactive to light.  Neck: Normal range of motion. Neck supple.  Cardiovascular: Normal rate, regular rhythm, normal heart sounds and intact distal pulses.   Pulmonary/Chest: Effort normal and breath sounds normal.  Abdominal: Soft. Bowel sounds are normal.  Musculoskeletal: Normal range of motion.  Neurological: He is alert and oriented to person, place, and time.  Skin: Skin is warm and dry.  Psychiatric: He has a normal mood and affect. His behavior is normal. Judgment and thought content normal.    BP 146/60 mmHg  Pulse 56  Temp(Src) 97.5 F (36.4 C) (Oral)  Ht 5'  6" (1.676 m)  Wt 184 lb (83.462 kg)  BMI 29.71 kg/m2         Assessment & Plan:  1. Type 2 diabetes mellitus without complication He does not check sugars at home so it's important to see what A1c is today - POCT glycosylated hemoglobin (Hb A1C) - metFORMIN (GLUCOPHAGE) 500 MG tablet; Take 1 tablet (500 mg total) by mouth 2 (two) times daily with a meal.  Dispense: 180 tablet; Refill: 2  2. HLD (hyperlipidemia) Lipids were checked 3 months ago and are at goal  3. Essential hypertension Pressure well  controlled on current 3 drug regimen - metoprolol succinate (TOPROL-XL) 100 MG 24 hr tablet; Take 1 tablet (100 mg total) by mouth 1 day or 1 dose.  Dispense: 90 tablet; Refill: 2  4. BPH (benign prostatic hyperplasia) Followed by urology and wishes to continue Proscar  5. Essential hypertension, benign  - furosemide (LASIX) 40 MG tablet; Take one and half po qd  Dispense: 135 tablet; Refill: 2  Wardell Honour MD

## 2015-03-08 NOTE — Patient Instructions (Signed)
Schedule eye exam  Pneumococcal Vaccine, Polyvalent suspension for injection What is this medicine? PNEUMOCOCCAL VACCINE, POLYVALENT (NEU mo KOK al vak SEEN, pol ee VEY luhnt) is a vaccine to prevent pneumococcus bacteria infection. These bacteria are a major cause of ear infections, 'Strep throat' infections, and serious pneumonia, meningitis, or blood infections worldwide. These vaccines help the body to produce antibodies (protective substances) that help your body defend against these bacteria. This vaccine is recommended for infants and young children. This vaccine will not treat an infection. This medicine may be used for other purposes; ask your health care provider or pharmacist if you have questions. COMMON BRAND NAME(S): Prevnar 13 What should I tell my health care provider before I take this medicine? They need to know if you have any of these conditions: -bleeding problems -fever -immune system problems -low platelet count in the blood -seizures -an unusual or allergic reaction to pneumococcal vaccine, diphtheria toxoid, other vaccines, latex, other medicines, foods, dyes, or preservatives -pregnant or trying to get pregnant -breast-feeding How should I use this medicine? This vaccine is for injection into a muscle. It is given by a health care professional. A copy of Vaccine Information Statements will be given before each vaccination. Read this sheet carefully each time. The sheet may change frequently. Talk to your pediatrician regarding the use of this medicine in children. While this drug may be prescribed for children as young as 79 weeks old for selected conditions, precautions do apply. Overdosage: If you think you have taken too much of this medicine contact a poison control center or emergency room at once. NOTE: This medicine is only for you. Do not share this medicine with others. What if I miss a dose? It is important not to miss your dose. Call your doctor or health  care professional if you are unable to keep an appointment. What may interact with this medicine? -medicines for cancer chemotherapy -medicines that suppress your immune function -medicines that treat or prevent blood clots like warfarin, enoxaparin, and dalteparin -steroid medicines like prednisone or cortisone This list may not describe all possible interactions. Give your health care provider a list of all the medicines, herbs, non-prescription drugs, or dietary supplements you use. Also tell them if you smoke, drink alcohol, or use illegal drugs. Some items may interact with your medicine. What should I watch for while using this medicine? Mild fever and pain should go away in 3 days or less. Report any unusual symptoms to your doctor or health care professional. What side effects may I notice from receiving this medicine? Side effects that you should report to your doctor or health care professional as soon as possible: -allergic reactions like skin rash, itching or hives, swelling of the face, lips, or tongue -breathing problems -confused -fever over 102 degrees F -pain, tingling, numbness in the hands or feet -seizures -unusual bleeding or bruising -unusual muscle weakness Side effects that usually do not require medical attention (report to your doctor or health care professional if they continue or are bothersome): -aches and pains -diarrhea -fever of 102 degrees F or less -headache -irritable -loss of appetite -pain, tender at site where injected -trouble sleeping This list may not describe all possible side effects. Call your doctor for medical advice about side effects. You may report side effects to FDA at 1-800-FDA-1088. Where should I keep my medicine? This does not apply. This vaccine is given in a clinic, pharmacy, doctor's office, or other health care setting and will not be  stored at home. NOTE: This sheet is a summary. It may not cover all possible information. If  you have questions about this medicine, talk to your doctor, pharmacist, or health care provider.  2015, [employer]vier/Gold Standard. (2009-02-09 10:17:22)

## 2015-03-10 ENCOUNTER — Ambulatory Visit: Payer: Medicare Other | Admitting: Family Medicine

## 2015-04-30 ENCOUNTER — Other Ambulatory Visit: Payer: Self-pay | Admitting: Family Medicine

## 2015-04-30 DIAGNOSIS — E785 Hyperlipidemia, unspecified: Secondary | ICD-10-CM

## 2015-04-30 MED ORDER — NIACIN ER (ANTIHYPERLIPIDEMIC) 1000 MG PO TBCR: 1000.0000 mg | EXTENDED_RELEASE_TABLET | Freq: Every day | ORAL | Status: DC

## 2015-04-30 MED ORDER — FEBUXOSTAT 40 MG PO TABS: 80.0000 mg | ORAL_TABLET | Freq: Every day | ORAL | Status: DC

## 2015-04-30 NOTE — Telephone Encounter (Signed)
Done, pt aware.

## 2015-05-11 ENCOUNTER — Telehealth: Payer: Self-pay | Admitting: Family Medicine

## 2015-05-11 DIAGNOSIS — N400 Enlarged prostate without lower urinary tract symptoms: Secondary | ICD-10-CM

## 2015-05-11 MED ORDER — FINASTERIDE 5 MG PO TABS: 5.0000 mg | ORAL_TABLET | Freq: Every day | ORAL | Status: DC

## 2015-05-11 NOTE — Telephone Encounter (Signed)
done

## 2015-08-02 DIAGNOSIS — C61 Malignant neoplasm of prostate: Secondary | ICD-10-CM | POA: Diagnosis not present

## 2015-08-27 ENCOUNTER — Ambulatory Visit (INDEPENDENT_AMBULATORY_CARE_PROVIDER_SITE_OTHER): Payer: Medicare Other | Admitting: Urology

## 2015-08-27 DIAGNOSIS — R972 Elevated prostate specific antigen [PSA]: Secondary | ICD-10-CM

## 2015-08-27 DIAGNOSIS — R3912 Poor urinary stream: Secondary | ICD-10-CM | POA: Diagnosis not present

## 2015-08-27 DIAGNOSIS — C61 Malignant neoplasm of prostate: Secondary | ICD-10-CM

## 2015-08-27 DIAGNOSIS — N401 Enlarged prostate with lower urinary tract symptoms: Secondary | ICD-10-CM

## 2015-09-08 ENCOUNTER — Ambulatory Visit (INDEPENDENT_AMBULATORY_CARE_PROVIDER_SITE_OTHER): Payer: Medicare Other | Admitting: Family Medicine

## 2015-09-08 ENCOUNTER — Encounter: Payer: Self-pay | Admitting: Family Medicine

## 2015-09-08 VITALS — BP 156/73 | HR 57 | Temp 97.2°F | Ht 66.0 in | Wt 174.0 lb

## 2015-09-08 DIAGNOSIS — N400 Enlarged prostate without lower urinary tract symptoms: Secondary | ICD-10-CM | POA: Diagnosis not present

## 2015-09-08 DIAGNOSIS — I10 Essential (primary) hypertension: Secondary | ICD-10-CM | POA: Diagnosis not present

## 2015-09-08 DIAGNOSIS — E785 Hyperlipidemia, unspecified: Secondary | ICD-10-CM | POA: Diagnosis not present

## 2015-09-08 DIAGNOSIS — E119 Type 2 diabetes mellitus without complications: Secondary | ICD-10-CM

## 2015-09-08 DIAGNOSIS — Z23 Encounter for immunization: Secondary | ICD-10-CM

## 2015-09-08 LAB — POCT UA - MICROALBUMIN: Microalbumin Ur, POC: 20 mg/L

## 2015-09-08 LAB — POCT GLYCOSYLATED HEMOGLOBIN (HGB A1C): HEMOGLOBIN A1C: 5.8

## 2015-09-08 MED ORDER — FUROSEMIDE 40 MG PO TABS: ORAL_TABLET | ORAL | Status: DC

## 2015-09-08 MED ORDER — BENAZEPRIL HCL 40 MG PO TABS: ORAL_TABLET | ORAL | Status: DC

## 2015-09-08 MED ORDER — AMLODIPINE BESYLATE 10 MG PO TABS: 10.0000 mg | ORAL_TABLET | Freq: Every evening | ORAL | Status: DC

## 2015-09-08 MED ORDER — NIACIN ER (ANTIHYPERLIPIDEMIC) 1000 MG PO TBCR: 1000.0000 mg | EXTENDED_RELEASE_TABLET | Freq: Every day | ORAL | Status: DC

## 2015-09-08 MED ORDER — FEBUXOSTAT 40 MG PO TABS: 80.0000 mg | ORAL_TABLET | Freq: Every day | ORAL | Status: DC

## 2015-09-08 MED ORDER — PRAVASTATIN SODIUM 40 MG PO TABS: 40.0000 mg | ORAL_TABLET | Freq: Every evening | ORAL | Status: DC

## 2015-09-08 MED ORDER — METOPROLOL SUCCINATE ER 100 MG PO TB24: 100.0000 mg | ORAL_TABLET | ORAL | Status: DC

## 2015-09-08 NOTE — Progress Notes (Signed)
Subjective:    Patient ID: Alan Netters., male    DOB: 24-Jan-1941, 74 y.o.   MRN: [id number]  HPI Pt here for follow up and management of chronic medical problems which includes hypertension, hyperlipidemia, and diabetes. He is taking medications regularly. He is here to follow-up chronic medical problems. He does not check sugars at home but A1c's have been well within the goal so I'm not insisting. Blood pressure since his last visit has come down some 50 points. He has lost about 10 pounds trying to watch his diet better. He has had no chest pain. He denies any leg symptoms burning numbness tingling or pain.      Patient Active Problem List   Diagnosis Date Noted  . ED (erectile dysfunction)   . Prostate cancer   . HTN (hypertension) 04/29/2013  . HLD (hyperlipidemia) 04/29/2013  . Diabetes 04/29/2013  . Gout 04/29/2013  . Obesity, unspecified 04/29/2013  . BPH (benign prostatic hyperplasia) 04/29/2013   Outpatient Encounter Prescriptions as of 09/08/2015  Medication Sig  . amLODipine (NORVASC) 10 MG tablet Take 1 tablet (10 mg total) by mouth every evening.  Marland Kitchen aspirin 81 MG tablet Take 81 mg by mouth every evening.   . benazepril (LOTENSIN) 40 MG tablet TAKE ONE TABLET BY MOUTH ONCE DAILY  . febuxostat (ULORIC) 40 MG tablet Take 2 tablets (80 mg total) by mouth daily.  . finasteride (PROSCAR) 5 MG tablet Take 1 tablet (5 mg total) by mouth daily.  . furosemide (LASIX) 40 MG tablet Take one and half po qd  . metFORMIN (GLUCOPHAGE) 500 MG tablet Take 1 tablet (500 mg total) by mouth 2 (two) times daily with a meal.  . metoprolol succinate (TOPROL-XL) 100 MG 24 hr tablet Take 1 tablet (100 mg total) by mouth 1 day or 1 dose.  . Multiple Vitamins-Minerals (MULTIVITAMIN PO) Take 1 tablet by mouth daily.  . niacin (NIASPAN) 1000 MG CR tablet Take 1 tablet (1,000 mg total) by mouth at bedtime.  . pravastatin (PRAVACHOL) 40 MG tablet Take 1 tablet (40 mg total) by mouth every  evening.   No facility-administered encounter medications on file as of 09/08/2015.     Review of Systems  Constitutional: Negative.   HENT: Negative.   Eyes: Negative.   Respiratory: Negative.   Cardiovascular: Negative.   Gastrointestinal: Negative.   Endocrine: Negative.   Genitourinary: Negative.   Musculoskeletal: Negative.   Skin: Negative.   Allergic/Immunologic: Negative.   Neurological: Negative.   Hematological: Negative.   Psychiatric/Behavioral: Negative.        Objective:   Physical Exam  Constitutional: He is oriented to person, place, and time. He appears well-developed and well-nourished.  Cardiovascular: Normal rate, regular rhythm and intact distal pulses.   Pulmonary/Chest: Effort normal and breath sounds normal.  Neurological: He is alert and oriented to person, place, and time.    BP 156/73 mmHg  Pulse 57  Temp(Src) 97.2 F (36.2 C) (Oral)  Ht 5\' 6"  (1.676 m)  Wt 174 lb (78.926 kg)  BMI 28.10 kg/m2       Assessment & Plan:  1. BPH (benign prostatic hyperplasia) Problem is stable  2. Type 2 diabetes mellitus without complication Sugars have been well controlled in the past with normal A1c's expect same results today - POCT glycosylated hemoglobin (Hb A1C) - POCT UA - Microalbumin - Microalbumin, urine  3. HLD (hyperlipidemia) We'll need to check lipids at next visit in 6 months but basically everything was at  goal except for his HDL being low and he is on niacin - niacin (NIASPAN) 1000 MG CR tablet; Take 1 tablet (1,000 mg total) by mouth at bedtime.  Dispense: 30 tablet; Refill: 3  4. Essential hypertension Systolic blood pressure has dropped some 50 points since last visit now 156/73. This is not ideal but is much better - metoprolol succinate (TOPROL-XL) 100 MG 24 hr tablet; Take 1 tablet (100 mg total) by mouth 1 day or 1 dose.  Dispense: 90 tablet; Refill: 2  5. Essential hypertension, benign The above for discussion  Wardell Honour MD - furosemide (LASIX) 40 MG tablet; Take one and half po qd  Dispense: 135 tablet; Refill: 2

## 2015-09-08 NOTE — Patient Instructions (Signed)
Medicare Annual Wellness Visit  [facility] and the medical providers at [facility] strive to bring you the best medical care.  In doing so we not only want to address your current medical conditions and concerns but also to detect new conditions early and prevent illness, disease and health-related problems.    Medicare offers a yearly Wellness Visit which allows our clinical staff to assess your need for preventative services including immunizations, lifestyle education, counseling to decrease risk of preventable diseases and screening for fall risk and other medical concerns.    This visit is provided free of charge (no copay) for all Medicare recipients. The clinical pharmacists at [facility] have begun to conduct these Wellness Visits which will also include a thorough review of all your medications.    As you primary medical provider recommend that you make an appointment for your Annual Wellness Visit if you have not done so already this year.  You may set up this appointment before you leave today or you may call back ([phone]) and schedule an appointment.  Please make sure when you call that you mention that you are scheduling your Annual Wellness Visit with the clinical pharmacist so that the appointment may be made for the proper length of time.     Continue current medications. Continue good therapeutic lifestyle changes which include good diet and exercise. Fall precautions discussed with patient. If an FOBT was given today- please return it to our front desk. If you are over 50 years old - you may need Prevnar 13 or the adult Pneumonia vaccine.  **Flu shots will be available soon--- please call and schedule a FLU-CLINIC appointment**  After your visit with us today you will receive a survey in the mail or online from [employer] regarding your care with us. Please take a moment to fill this out. Your feedback is  very important to us as you can help us better understand your patient needs as well as improve your experience and satisfaction. WE CARE ABOUT YOU!!!   **Please join us SEPT.22, 2016 from 5:00 to 7:00pm for our OPEN HOUSE! Come out and meet our NEW providers**  

## 2015-09-09 LAB — MICROALBUMIN, URINE: MICROALBUM., U, RANDOM: 15 ug/mL

## 2015-09-11 DIAGNOSIS — I748 Embolism and thrombosis of other arteries: Secondary | ICD-10-CM

## 2015-09-11 HISTORY — DX: Embolism and thrombosis of other arteries: I74.8

## 2015-09-29 ENCOUNTER — Encounter: Payer: Self-pay | Admitting: Family Medicine

## 2015-10-07 ENCOUNTER — Inpatient Hospital Stay (HOSPITAL_COMMUNITY)
Admission: EM | Admit: 2015-10-07 | Discharge: 2015-10-19 | DRG: 444 | Disposition: A | Discharge status: Home Health | Payer: Medicare Other | Attending: Internal Medicine | Admitting: Internal Medicine

## 2015-10-07 ENCOUNTER — Emergency Department (HOSPITAL_COMMUNITY): Payer: Medicare Other

## 2015-10-07 ENCOUNTER — Inpatient Hospital Stay (HOSPITAL_COMMUNITY): Payer: Medicare Other

## 2015-10-07 ENCOUNTER — Encounter (HOSPITAL_COMMUNITY): Payer: Self-pay

## 2015-10-07 DIAGNOSIS — K859 Acute pancreatitis without necrosis or infection, unspecified: Secondary | ICD-10-CM

## 2015-10-07 DIAGNOSIS — R109 Unspecified abdominal pain: Secondary | ICD-10-CM

## 2015-10-07 DIAGNOSIS — E876 Hypokalemia: Secondary | ICD-10-CM | POA: Diagnosis not present

## 2015-10-07 DIAGNOSIS — Z823 Family history of stroke: Secondary | ICD-10-CM

## 2015-10-07 DIAGNOSIS — I10 Essential (primary) hypertension: Secondary | ICD-10-CM | POA: Diagnosis not present

## 2015-10-07 DIAGNOSIS — C61 Malignant neoplasm of prostate: Secondary | ICD-10-CM | POA: Diagnosis present

## 2015-10-07 DIAGNOSIS — K851 Biliary acute pancreatitis without necrosis or infection: Secondary | ICD-10-CM | POA: Diagnosis not present

## 2015-10-07 DIAGNOSIS — M6281 Muscle weakness (generalized): Secondary | ICD-10-CM | POA: Diagnosis not present

## 2015-10-07 DIAGNOSIS — R112 Nausea with vomiting, unspecified: Secondary | ICD-10-CM | POA: Diagnosis present

## 2015-10-07 DIAGNOSIS — E1169 Type 2 diabetes mellitus with other specified complication: Secondary | ICD-10-CM | POA: Diagnosis present

## 2015-10-07 DIAGNOSIS — D735 Infarction of spleen: Secondary | ICD-10-CM | POA: Diagnosis not present

## 2015-10-07 DIAGNOSIS — K831 Obstruction of bile duct: Secondary | ICD-10-CM | POA: Diagnosis present

## 2015-10-07 DIAGNOSIS — Z820 Family history of epilepsy and other diseases of the nervous system: Secondary | ICD-10-CM | POA: Diagnosis not present

## 2015-10-07 DIAGNOSIS — R740 Nonspecific elevation of levels of transaminase and lactic acid dehydrogenase [LDH]: Secondary | ICD-10-CM

## 2015-10-07 DIAGNOSIS — Z4659 Encounter for fitting and adjustment of other gastrointestinal appliance and device: Secondary | ICD-10-CM | POA: Insufficient documentation

## 2015-10-07 DIAGNOSIS — Z4682 Encounter for fitting and adjustment of non-vascular catheter: Secondary | ICD-10-CM | POA: Diagnosis not present

## 2015-10-07 DIAGNOSIS — K8512 Biliary acute pancreatitis with infected necrosis: Secondary | ICD-10-CM | POA: Diagnosis present

## 2015-10-07 DIAGNOSIS — R17 Unspecified jaundice: Secondary | ICD-10-CM | POA: Diagnosis not present

## 2015-10-07 DIAGNOSIS — E785 Hyperlipidemia, unspecified: Secondary | ICD-10-CM | POA: Diagnosis present

## 2015-10-07 DIAGNOSIS — Z7984 Long term (current) use of oral hypoglycemic drugs: Secondary | ICD-10-CM

## 2015-10-07 DIAGNOSIS — E782 Mixed hyperlipidemia: Secondary | ICD-10-CM | POA: Diagnosis present

## 2015-10-07 DIAGNOSIS — D649 Anemia, unspecified: Secondary | ICD-10-CM | POA: Diagnosis not present

## 2015-10-07 DIAGNOSIS — Z7982 Long term (current) use of aspirin: Secondary | ICD-10-CM | POA: Diagnosis not present

## 2015-10-07 DIAGNOSIS — Z8249 Family history of ischemic heart disease and other diseases of the circulatory system: Secondary | ICD-10-CM

## 2015-10-07 DIAGNOSIS — K8020 Calculus of gallbladder without cholecystitis without obstruction: Secondary | ICD-10-CM | POA: Diagnosis not present

## 2015-10-07 DIAGNOSIS — R1010 Upper abdominal pain, unspecified: Secondary | ICD-10-CM | POA: Diagnosis not present

## 2015-10-07 DIAGNOSIS — K8591 Acute pancreatitis with uninfected necrosis, unspecified: Secondary | ICD-10-CM | POA: Diagnosis not present

## 2015-10-07 DIAGNOSIS — E119 Type 2 diabetes mellitus without complications: Secondary | ICD-10-CM | POA: Diagnosis present

## 2015-10-07 DIAGNOSIS — K8011 Calculus of gallbladder with chronic cholecystitis with obstruction: Secondary | ICD-10-CM | POA: Diagnosis not present

## 2015-10-07 DIAGNOSIS — M109 Gout, unspecified: Secondary | ICD-10-CM | POA: Diagnosis present

## 2015-10-07 DIAGNOSIS — Z452 Encounter for adjustment and management of vascular access device: Secondary | ICD-10-CM | POA: Diagnosis not present

## 2015-10-07 DIAGNOSIS — R1084 Generalized abdominal pain: Secondary | ICD-10-CM | POA: Diagnosis not present

## 2015-10-07 DIAGNOSIS — Z87898 Personal history of other specified conditions: Secondary | ICD-10-CM | POA: Diagnosis not present

## 2015-10-07 DIAGNOSIS — Z87891 Personal history of nicotine dependence: Secondary | ICD-10-CM | POA: Diagnosis not present

## 2015-10-07 DIAGNOSIS — Z934 Other artificial openings of gastrointestinal tract status: Secondary | ICD-10-CM

## 2015-10-07 DIAGNOSIS — K429 Umbilical hernia without obstruction or gangrene: Secondary | ICD-10-CM | POA: Diagnosis not present

## 2015-10-07 DIAGNOSIS — Z809 Family history of malignant neoplasm, unspecified: Secondary | ICD-10-CM

## 2015-10-07 DIAGNOSIS — E0811 Diabetes mellitus due to underlying condition with ketoacidosis with coma: Secondary | ICD-10-CM | POA: Diagnosis not present

## 2015-10-07 DIAGNOSIS — K861 Other chronic pancreatitis: Secondary | ICD-10-CM | POA: Diagnosis not present

## 2015-10-07 DIAGNOSIS — R7989 Other specified abnormal findings of blood chemistry: Secondary | ICD-10-CM | POA: Diagnosis not present

## 2015-10-07 DIAGNOSIS — K8511 Biliary acute pancreatitis with uninfected necrosis: Secondary | ICD-10-CM

## 2015-10-07 DIAGNOSIS — J90 Pleural effusion, not elsewhere classified: Secondary | ICD-10-CM | POA: Diagnosis not present

## 2015-10-07 DIAGNOSIS — R7401 Elevation of levels of liver transaminase levels: Secondary | ICD-10-CM | POA: Diagnosis present

## 2015-10-07 DIAGNOSIS — R945 Abnormal results of liver function studies: Secondary | ICD-10-CM

## 2015-10-07 DIAGNOSIS — K833 Fistula of bile duct: Secondary | ICD-10-CM | POA: Diagnosis not present

## 2015-10-07 LAB — BASIC METABOLIC PANEL
ANION GAP: 10 (ref 5–15)
BUN: 16 mg/dL (ref 6–20)
CALCIUM: 9.2 mg/dL (ref 8.9–10.3)
CHLORIDE: 106 mmol/L (ref 101–111)
CO2: 25 mmol/L (ref 22–32)
Creatinine, Ser: 1.13 mg/dL (ref 0.61–1.24)
GFR calc non Af Amer: >60 mL/min (ref >60)
Glucose, Bld: 198 mg/dL — ABNORMAL HIGH (ref 65–99)
Potassium: 3.7 mmol/L (ref 3.5–5.1)
SODIUM: 141 mmol/L (ref 135–145)

## 2015-10-07 LAB — CBC
HCT: 42.9 % (ref 39.0–52.0)
HEMOGLOBIN: 15.6 g/dL (ref 13.0–17.0)
MCH: 33 pg (ref 26.0–34.0)
MCHC: 36.4 g/dL — AB (ref 30.0–36.0)
MCV: 90.7 fL (ref 78.0–100.0)
PLATELETS: 317 10*3/uL (ref 150–400)
RBC: 4.73 MIL/uL (ref 4.22–5.81)
RDW: 13.3 % (ref 11.5–15.5)
WBC: 9.5 10*3/uL (ref 4.0–10.5)

## 2015-10-07 LAB — COMPREHENSIVE METABOLIC PANEL
ALBUMIN: 4.4 g/dL (ref 3.5–5.0)
ALK PHOS: 134 U/L — AB (ref 38–126)
ALT: 557 U/L — ABNORMAL HIGH (ref 17–63)
AST: 1093 U/L — ABNORMAL HIGH (ref 15–41)
Anion gap: 17 — ABNORMAL HIGH (ref 5–15)
BUN: 18 mg/dL (ref 6–20)
CALCIUM: 10.5 mg/dL — AB (ref 8.9–10.3)
CHLORIDE: 98 mmol/L — AB (ref 101–111)
CO2: 25 mmol/L (ref 22–32)
Creatinine, Ser: 1.36 mg/dL — ABNORMAL HIGH (ref 0.61–1.24)
GFR calc non Af Amer: 50 mL/min — ABNORMAL LOW (ref >60)
GFR, EST AFRICAN AMERICAN: 58 mL/min — AB (ref >60)
GLUCOSE: 223 mg/dL — AB (ref 65–99)
POTASSIUM: 3.2 mmol/L — AB (ref 3.5–5.1)
Sodium: 140 mmol/L (ref 135–145)
Total Bilirubin: 3.3 mg/dL — ABNORMAL HIGH (ref 0.3–1.2)
Total Protein: 8.2 g/dL — ABNORMAL HIGH (ref 6.5–8.1)

## 2015-10-07 LAB — GLUCOSE, CAPILLARY
GLUCOSE-CAPILLARY: 159 mg/dL — AB (ref 65–99)
GLUCOSE-CAPILLARY: 175 mg/dL — AB (ref 65–99)
Glucose-Capillary: 162 mg/dL — ABNORMAL HIGH (ref 65–99)
Glucose-Capillary: 130 mg/dL — ABNORMAL HIGH (ref 65–99)

## 2015-10-07 LAB — LIPASE, BLOOD: Lipase: 1609 U/L — ABNORMAL HIGH (ref 11–51)

## 2015-10-07 LAB — MAGNESIUM: Magnesium: 1.5 mg/dL — ABNORMAL LOW (ref 1.7–2.4)

## 2015-10-07 LAB — MRSA PCR SCREENING: MRSA BY PCR: NEGATIVE

## 2015-10-07 MED ORDER — SODIUM CHLORIDE 0.9 % IV BOLUS (SEPSIS)
1000.0000 mL | Freq: Once | INTRAVENOUS | Status: AC
  Administered 2015-10-07: 1000 mL via INTRAVENOUS

## 2015-10-07 MED ORDER — ONDANSETRON HCL 4 MG/2ML IJ SOLN
4.0000 mg | Freq: Four times a day (QID) | INTRAMUSCULAR | Status: DC | PRN
  Administered 2015-10-08 – 2015-10-11 (×2): 4 mg via INTRAVENOUS
  Filled 2015-10-07 (×2): qty 2

## 2015-10-07 MED ORDER — ONDANSETRON HCL 4 MG PO TABS
4.0000 mg | ORAL_TABLET | Freq: Four times a day (QID) | ORAL | Status: DC | PRN
Start: 2015-10-07 — End: 2015-10-19

## 2015-10-07 MED ORDER — MAGNESIUM SULFATE 2 GM/50ML IV SOLN
2.0000 g | Freq: Once | INTRAVENOUS | Status: AC
  Administered 2015-10-07: 2 g via INTRAVENOUS
  Filled 2015-10-07: qty 50

## 2015-10-07 MED ORDER — MORPHINE SULFATE (PF) 4 MG/ML IV SOLN
8.0000 mg | Freq: Once | INTRAVENOUS | Status: AC
  Administered 2015-10-07: 8 mg via INTRAVENOUS
  Filled 2015-10-07: qty 2

## 2015-10-07 MED ORDER — POTASSIUM CHLORIDE 10 MEQ/100ML IV SOLN
10.0000 meq | INTRAVENOUS | Status: AC
  Administered 2015-10-07 (×2): 10 meq via INTRAVENOUS
  Filled 2015-10-07: qty 100

## 2015-10-07 MED ORDER — SODIUM CHLORIDE 0.9 % IV SOLN
INTRAVENOUS | Status: AC
  Administered 2015-10-07 – 2015-10-08 (×4): via INTRAVENOUS

## 2015-10-07 MED ORDER — INSULIN ASPART 100 UNIT/ML ~~LOC~~ SOLN
0.0000 [IU] | SUBCUTANEOUS | Status: DC
  Administered 2015-10-07: 1 [IU] via SUBCUTANEOUS
  Administered 2015-10-07: 2 [IU] via SUBCUTANEOUS
  Administered 2015-10-07: 3 [IU] via SUBCUTANEOUS
  Administered 2015-10-07: 2 [IU] via SUBCUTANEOUS
  Administered 2015-10-08 – 2015-10-12 (×15): 1 [IU] via SUBCUTANEOUS
  Administered 2015-10-12: 2 [IU] via SUBCUTANEOUS
  Administered 2015-10-12 – 2015-10-13 (×4): 1 [IU] via SUBCUTANEOUS
  Administered 2015-10-13: 2 [IU] via SUBCUTANEOUS
  Administered 2015-10-13: 1 [IU] via SUBCUTANEOUS

## 2015-10-07 MED ORDER — ACETAMINOPHEN 650 MG RE SUPP: 650.0000 mg | Freq: Four times a day (QID) | RECTAL | Status: DC | PRN

## 2015-10-07 MED ORDER — METOPROLOL TARTRATE 1 MG/ML IV SOLN
5.0000 mg | Freq: Four times a day (QID) | INTRAVENOUS | Status: DC
  Administered 2015-10-07 – 2015-10-12 (×21): 5 mg via INTRAVENOUS
  Filled 2015-10-07 (×21): qty 5

## 2015-10-07 MED ORDER — PANTOPRAZOLE SODIUM 40 MG IV SOLR
40.0000 mg | Freq: Two times a day (BID) | INTRAVENOUS | Status: DC
  Administered 2015-10-07 – 2015-10-19 (×25): 40 mg via INTRAVENOUS
  Filled 2015-10-07 (×25): qty 40

## 2015-10-07 MED ORDER — SODIUM CHLORIDE 0.9 % IV SOLN
INTRAVENOUS | Status: DC
Start: 2015-10-07 — End: 2015-10-07
  Administered 2015-10-07: 09:00:00 via INTRAVENOUS

## 2015-10-07 MED ORDER — ONDANSETRON HCL 4 MG/2ML IJ SOLN
4.0000 mg | Freq: Once | INTRAMUSCULAR | Status: AC
  Administered 2015-10-07: 4 mg via INTRAVENOUS
  Filled 2015-10-07: qty 2

## 2015-10-07 MED ORDER — PIPERACILLIN-TAZOBACTAM 3.375 G IVPB 30 MIN
3.3750 g | Freq: Once | INTRAVENOUS | Status: AC
  Administered 2015-10-07: 3.375 g via INTRAVENOUS
  Filled 2015-10-07: qty 50

## 2015-10-07 MED ORDER — HYDROMORPHONE HCL 1 MG/ML IJ SOLN
0.5000 mg | INTRAMUSCULAR | Status: DC | PRN
  Administered 2015-10-07 – 2015-10-12 (×25): 1 mg via INTRAVENOUS
  Administered 2015-10-12: 0.5 mg via INTRAVENOUS
  Administered 2015-10-13 – 2015-10-16 (×7): 1 mg via INTRAVENOUS
  Filled 2015-10-07 (×32): qty 1

## 2015-10-07 MED ORDER — METOPROLOL TARTRATE 1 MG/ML IV SOLN
INTRAVENOUS | Status: AC
  Filled 2015-10-07: qty 5

## 2015-10-07 MED ORDER — PIPERACILLIN-TAZOBACTAM 3.375 G IVPB
3.3750 g | Freq: Three times a day (TID) | INTRAVENOUS | Status: DC
  Administered 2015-10-07 – 2015-10-08 (×4): 3.375 g via INTRAVENOUS
  Filled 2015-10-07 (×13): qty 50

## 2015-10-07 MED ORDER — IOHEXOL 300 MG/ML  SOLN
100.0000 mL | Freq: Once | INTRAMUSCULAR | Status: DC | PRN
  Administered 2015-10-07: 100 mL via INTRAVENOUS
  Filled 2015-10-07: qty 100

## 2015-10-07 MED ORDER — ACETAMINOPHEN 325 MG PO TABS: 650.0000 mg | ORAL_TABLET | Freq: Four times a day (QID) | ORAL | Status: DC | PRN

## 2015-10-07 MED ORDER — IOHEXOL 300 MG/ML  SOLN
50.0000 mL | Freq: Once | INTRAMUSCULAR | Status: AC | PRN
  Administered 2015-10-07: 50 mL via ORAL

## 2015-10-07 MED ORDER — KCL IN DEXTROSE-NACL 40-5-0.9 MEQ/L-%-% IV SOLN
INTRAVENOUS | Status: DC
  Administered 2015-10-07: 11:00:00 via INTRAVENOUS
  Filled 2015-10-07 (×3): qty 1000

## 2015-10-07 MED ORDER — HYDRALAZINE HCL 20 MG/ML IJ SOLN
5.0000 mg | Freq: Four times a day (QID) | INTRAMUSCULAR | Status: DC | PRN
  Administered 2015-10-07 – 2015-10-11 (×9): 5 mg via INTRAVENOUS
  Filled 2015-10-07 (×10): qty 1

## 2015-10-07 MED ORDER — MORPHINE SULFATE (PF) 4 MG/ML IV SOLN
4.0000 mg | INTRAVENOUS | Status: DC | PRN
  Administered 2015-10-07 (×2): 4 mg via INTRAVENOUS
  Filled 2015-10-07 (×2): qty 1

## 2015-10-07 MED ORDER — METOPROLOL TARTRATE 1 MG/ML IV SOLN
2.5000 mg | Freq: Four times a day (QID) | INTRAVENOUS | Status: DC
  Administered 2015-10-07 (×2): 2.5 mg via INTRAVENOUS
  Filled 2015-10-07 (×2): qty 5

## 2015-10-07 MED ORDER — METOPROLOL TARTRATE 1 MG/ML IV SOLN
2.5000 mg | Freq: Once | INTRAVENOUS | Status: AC
  Administered 2015-10-07: 2.5 mg via INTRAVENOUS

## 2015-10-07 MED ORDER — SODIUM CHLORIDE 0.9 % IV SOLN: INTRAVENOUS | Status: DC

## 2015-10-07 NOTE — Care Management Note (Signed)
Case Management Note  Patient Details  Name: Alan [name]. MRN: [id number] Date of Birth: [birth year]  Subjective/Objective:                  Admitted with pancreatitis. Pt is from home, lives with wife and is independent at baseline. Pt's wife and son at bedside during assessment. Pt uses no DME's at home, pt has no HH services prior to admission. Pt has no med needs prior to admission.  Action/Plan: Pt plans to return home with self care. Anticipate need for PT eval prior to DC. Will cont to follow.   Expected Discharge Date:    10/11/2015              Expected Discharge Plan:  Home/Self Care  In-House Referral:  NA  Discharge planning Services  CM Consult  Post Acute Care Choice:  NA Choice offered to:  NA  DME Arranged:    DME Agency:     HH Arranged:    HH Agency:     Status of Service:  In process, will continue to follow  Medicare Important Message Given:    Date Medicare IM Given:    Medicare IM give by:    Date Additional Medicare IM Given:    Additional Medicare Important Message give by:     If discussed at [place] of Stay Meetings, dates discussed:    Additional Comments:  Sherald Barge, RN 10/07/2015, 2:34 PM

## 2015-10-07 NOTE — ED Provider Notes (Signed)
CSN: [id number]     Arrival date & time 10/07/15  0259 History   First MD Initiated Contact with Patient 10/07/15 [phone]     Chief Complaint  Patient presents with  . Abdominal Pain  . Emesis      HPI Patient presents to the emergency department after developing nausea and vomiting this evening.  He also reports some upper abdominal discomfort and pain as well as some mid back pain.  He's never had discomfort and pain like this before.  No prior abdominal surgery.  He does report 2 episodes of diarrhea.  He denies hematemesis.  No melena or hematochezia.  Denies fevers and chills.  States he was in his normal state of health earlier today.  He feels as though his symptoms came on rather abruptly.  Pain is moderate in severity this time.   Past Medical History  Diagnosis Date  . Hypertension   . Heart murmur   . ED (erectile dysfunction)   . Agranulocytosis ([place])   . Diabetes mellitus   . Hyperlipidemia   . Gout   . Adenomatous colon polyp   . Prostate cancer ([place])     Followed by Dr. Jeffie Pollock    Past Surgical History  Procedure Laterality Date  . Colonoscopy    . Sigmoidoscopy     Family History  Problem Relation Age of Onset  . Stroke Mother   . Hypertension Mother   . Alzheimer's disease Mother   . Hypertension Father   . Cancer Sister     brain  . Cancer Sister    Social History  Substance Use Topics  . Smoking status: Former Smoker    Types: Cigarettes    Quit date: 12/11/1986  . Smokeless tobacco: Never Used  . Alcohol Use: No    Review of Systems  All other systems reviewed and are negative.     Allergies  Allopurinol and Colchicine  Home Medications   Prior to Admission medications   Medication Sig Start Date End Date Taking? Authorizing Provider  amLODipine (NORVASC) 10 MG tablet Take 1 tablet (10 mg total) by mouth every evening. 09/08/15  Yes Wardell Honour, MD  aspirin 81 MG tablet Take 81 mg by mouth every evening.    Yes Historical Provider,  MD  benazepril (LOTENSIN) 40 MG tablet TAKE ONE TABLET BY MOUTH ONCE DAILY 09/08/15  Yes Wardell Honour, MD  febuxostat (ULORIC) 40 MG tablet Take 2 tablets (80 mg total) by mouth daily. 09/08/15  Yes Wardell Honour, MD  finasteride (PROSCAR) 5 MG tablet Take 1 tablet (5 mg total) by mouth daily. 05/11/15  Yes Wardell Honour, MD  furosemide (LASIX) 40 MG tablet Take one and half po qd 09/08/15  Yes Wardell Honour, MD  metFORMIN (GLUCOPHAGE) 500 MG tablet Take 1 tablet (500 mg total) by mouth 2 (two) times daily with a meal. 03/08/15  Yes Wardell Honour, MD  metoprolol succinate (TOPROL-XL) 100 MG 24 hr tablet Take 1 tablet (100 mg total) by mouth 1 day or 1 dose. 09/08/15  Yes Wardell Honour, MD  Multiple Vitamins-Minerals (MULTIVITAMIN PO) Take 1 tablet by mouth daily.   Yes Historical Provider, MD  niacin (NIASPAN) 1000 MG CR tablet Take 1 tablet (1,000 mg total) by mouth at bedtime. 09/08/15  Yes Wardell Honour, MD  pravastatin (PRAVACHOL) 40 MG tablet Take 1 tablet (40 mg total) by mouth every evening. 09/08/15  Yes Wardell Honour, MD   Pulse 75  Temp(Src) 97.7 F (36.5 C) (Oral)  Resp 24  Ht 5\' 5"  (1.651 m)  Wt 180 lb (81.647 kg)  BMI 29.95 kg/m2  SpO2 100% Physical Exam  Constitutional: He is oriented to person, place, and time. He appears well-developed and well-nourished.  HENT:  Head: Normocephalic and atraumatic.  Eyes: EOM are normal.  Neck: Normal range of motion.  Cardiovascular: Normal rate, regular rhythm, normal heart sounds and intact distal pulses.   Pulmonary/Chest: Effort normal and breath sounds normal. No respiratory distress.  Abdominal: Soft. He exhibits no distension.  Mild upper abdominal and left-sided abdominal tenderness.  No guarding or rebound  Musculoskeletal: Normal range of motion.  Neurological: He is alert and oriented to person, place, and time.  Skin: Skin is warm and dry.  Psychiatric: He has a normal mood and affect. Judgment normal.   Nursing note and vitals reviewed.   ED Course  Procedures (including critical care time) Labs Review Labs Reviewed  CBC - Abnormal; Notable for the following:    MCHC 36.4 (*)    All other components within normal limits  COMPREHENSIVE METABOLIC PANEL - Abnormal; Notable for the following:    Potassium 3.2 (*)    Chloride 98 (*)    Glucose, Bld 223 (*)    Creatinine, Ser 1.36 (*)    Calcium 10.5 (*)    Total Protein 8.2 (*)    AST 1093 (*)    ALT 557 (*)    Alkaline Phosphatase 134 (*)    Total Bilirubin 3.3 (*)    GFR calc non Af Amer 50 (*)    GFR calc Af Amer 58 (*)    Anion gap 17 (*)    All other components within normal limits  LIPASE, BLOOD - Abnormal; Notable for the following:    Lipase 1609 (*)    All other components within normal limits    Imaging Review Ct Abdomen Pelvis W Contrast  10/07/2015  CLINICAL DATA:  Upper abdominal pain, vomiting and diarrhea after dinner last night. History of diabetes, hyperlipidemia, hypertension and prostate cancer. EXAM: CT ABDOMEN AND PELVIS WITH CONTRAST TECHNIQUE: Multidetector CT imaging of the abdomen and pelvis was performed using the standard protocol following bolus administration of intravenous contrast. CONTRAST:  87mL OMNIPAQUE IOHEXOL 300 MG/ML SOLN, 125mL OMNIPAQUE IOHEXOL 300 MG/ML SOLN COMPARISON:  CT abdomen and pelvis January 16, 2007 FINDINGS: LUNG BASES: Included view of the lung bases are clear. The heart is mildly enlarged, no pericardial effusions. Contrast in the distal esophagus associated with small hiatal hernia most consistent with reflux. SOLID ORGANS: At least 2 gallstones measure up to 17 mm with gallbladder wall thickening and pericholecystic inflammation. Patchy hypo enhancement of the pancreatic head, with extensive peripancreatic inflammation, no pseudocyst, pancreatic duct dilatation, pancreatic calcifications or mass. The liver, spleen, and adrenal glands are unremarkable. GASTROINTESTINAL TRACT: The  stomach, small and large bowel are normal in course and caliber without inflammatory changes. Wide necked 3 cm umbilical hernia containing a knuckle of small bowel without inflammatory changes or dilatation of the bowel. 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 Normal appendix. KIDNEYS/ URINARY TRACT: Kidneys are orthotopic, demonstrating symmetric enhancement. No nephrolithiasis, hydronephrosis or solid renal masses. The unopacified ureters are normal in course and caliber. Delayed imaging through the kidneys demonstrates symmetric prompt contrast excretion within the proximal urinary collecting system. Urinary bladder is partially distended and unremarkable. PERITONEUM/RETROPERITONEUM: Moderate amount of free fluid in the retroperitoneum, extending into the mesentery, tracking into the pelvis. Aortoiliac  vessels are normal in course and caliber, moderate calcific atherosclerosis. No lymphadenopathy by CT size criteria. Mild prostatomegaly. SOFT TISSUE/OSSEOUS STRUCTURES: Non-suspicious. Small fat containing umbilical hernias. Bridging osteophytes the sacroiliac joints. Degenerative change of lumbar spine resulting in moderate to severe L5-S1 neural foraminal narrowing. IMPRESSION: Acute pancreatitis, hypo enhancing pancreatic head compatible with focal necrotizing pancreatitis. Moderate amount of retroperitoneal free fluid, no abscess. Cholelithiasis with CT findings of cholecystitis likely reactive from acute pancreatitis. Wide necked 3 cm umbilical hernia containing small bowel without CT findings of incarceration or strangulation, no bowel obstruction . Electronically Signed   By: Elon Alas M.D.   On: 10/07/2015 05:05   I have personally reviewed and evaluated these images and lab results as part of my medical decision-making.   EKG Interpretation None      MDM   Final diagnoses:  Acute pancreatitis, unspecified pancreatitis type  Elevated bilirubin  Elevated liver  function tests    Patient undergo laboratory studies including LFTs and lipase.  CT imaging as the patient has had some back pain associated as well.  Pain and nausea control.  Nothing by mouth.  5:49 AM Pt feels much better at this time.  Patient be admitted to the hospital for elevated liver function tests as well as elevated lipase.  Given his acute onset pain I'm concerned about the possibility of choledocholithiasis.  He may benefit from MRCP.  His labs are more consistent with an obstructive process given his elevated alkaline phosphatase and bilirubin in addition to elevated LFTs and lipase.  He does have noted gallstones.  He's been given a single dose of IV Zosyn at this time.  Patient be kept nothing by mouth.  Ongoing maintenance fluids.  Feels much better this time.  One episode of documented hypoxia of 76%.  I do not believe this was a true reading.  Nursing staff had placed the patient on 2 L of nasal cannula oxygen.  I since taken the patient off nasal cannula and we'll reevaluate his O2 sats.  Case was discussed with gastroenterology Dr. Gala Romney who will evaluate the patient the morning.  Agrees with nothing by mouth status  Dr Arnoldo Morale, [facility] (admitting provider) Dr Gala Romney (GI)    Jola Schmidt, MD 10/07/15 [phone]

## 2015-10-07 NOTE — Progress Notes (Signed)
[facility] PROGRESS NOTE  [name] JSH:[id number] DOB: Feb 11, 1941    PCP:   Wardell Honour, MD   HPI:  Patient is a 74 yo male with hx of DM, HTN, prostate cancer, HLD, Gout, admitted by Dr Arnoldo Morale for obstructive cholelithiasis, pancreatitis, and cholecystitis.  He has been NPO, and GI has been consulted.  He likely will need ERCP with stone removal. His LFTw were significantly elevated, but Alk phos rose only to 137.  Lipase was 1609.   Rewiew of Systems:  Constitutional: Negative for malaise, fever and chills. No significant weight loss or weight gain Eyes: Negative for eye pain, redness and discharge, diplopia, visual changes, or flashes of light. ENMT: Negative for ear pain, hoarseness, nasal congestion, sinus pressure and sore throat. No headaches; tinnitus, drooling, or problem swallowing. Cardiovascular: Negative for chest pain, palpitations, diaphoresis, dyspnea and peripheral edema. ; No orthopnea, PND Respiratory: Negative for cough, hemoptysis, wheezing and stridor. No pleuritic chestpain. Gastrointestinal: Negative for nausea, vomiting, diarrhea, constipation, melena, blood in stool, hematemesis, jaundice and rectal bleeding.    Genitourinary: Negative for frequency, dysuria, incontinence,flank pain and hematuria; Musculoskeletal: Negative for back pain and neck pain. Negative for swelling and trauma.;  Skin: . Negative for pruritus, rash, abrasions, bruising and skin lesion.; ulcerations Neuro: Negative for headache, lightheadedness and neck stiffness. Negative for weakness, altered level of consciousness , altered mental status, extremity weakness, burning feet, involuntary movement, seizure and syncope.  Psych: negative for anxiety, depression, insomnia, tearfulness, panic attacks, hallucinations, paranoia, suicidal or homicidal ideation   Past Medical History  Diagnosis Date  . Hypertension   . Heart murmur   . ED (erectile dysfunction)   . Agranulocytosis  ([place])   . Diabetes mellitus   . Hyperlipidemia   . Gout   . Adenomatous colon polyp   . Prostate cancer ([place])     Followed by Dr. Jeffie Pollock     Past Surgical History  Procedure Laterality Date  . Colonoscopy  2012    [name]: moderate sigmoid diverticulosis, internal hemorrhoids. next tcs 2017  . Sigmoidoscopy      Medications:  HOME MEDS: Prior to Admission medications   Medication Sig Start Date End Date Taking? Authorizing Provider  amLODipine (NORVASC) 10 MG tablet Take 1 tablet (10 mg total) by mouth every evening. 09/08/15  Yes Wardell Honour, MD  aspirin 81 MG tablet Take 81 mg by mouth every evening.    Yes Historical Provider, MD  benazepril (LOTENSIN) 40 MG tablet TAKE ONE TABLET BY MOUTH ONCE DAILY 09/08/15  Yes Wardell Honour, MD  febuxostat (ULORIC) 40 MG tablet Take 2 tablets (80 mg total) by mouth daily. 09/08/15  Yes Wardell Honour, MD  finasteride (PROSCAR) 5 MG tablet Take 1 tablet (5 mg total) by mouth daily. 05/11/15  Yes Wardell Honour, MD  furosemide (LASIX) 40 MG tablet Take one and half po qd 09/08/15  Yes Wardell Honour, MD  metFORMIN (GLUCOPHAGE) 500 MG tablet Take 1 tablet (500 mg total) by mouth 2 (two) times daily with a meal. 03/08/15  Yes Wardell Honour, MD  metoprolol succinate (TOPROL-XL) 100 MG 24 hr tablet Take 1 tablet (100 mg total) by mouth 1 day or 1 dose. 09/08/15  Yes Wardell Honour, MD  Multiple Vitamins-Minerals (MULTIVITAMIN PO) Take 1 tablet by mouth daily.   Yes Historical Provider, MD  niacin (NIASPAN) 1000 MG CR tablet Take 1 tablet (1,000 mg total) by mouth at bedtime. 09/08/15  Yes  Wardell Honour, MD  pravastatin (PRAVACHOL) 40 MG tablet Take 1 tablet (40 mg total) by mouth every evening. 09/08/15  Yes Wardell Honour, MD     Allergies:  Allergies  Allergen Reactions  . Allopurinol Other (See Comments)    Significantly decreased WBC's  . Colchicine Other (See Comments)    Significantly decreased WBC's    Social  History:   reports that he quit smoking about 28 years ago. His smoking use included Cigarettes. He has never used smokeless tobacco. He reports that he does not drink alcohol or use illicit drugs.  Family History: Family History  Problem Relation Age of Onset  . Stroke Mother   . Hypertension Mother   . Alzheimer's disease Mother   . Hypertension Father   . Cancer Sister     brain  . Cancer Sister     breast  . Colon cancer Neg Hx   . Pancreatic disease Neg Hx      Physical Exam: Filed Vitals:   10/07/15 0600 10/07/15 0648 10/07/15 0808 10/07/15 0849  BP: 188/91   184/90  Pulse: 91     Temp:  99.5 F (37.5 C) 98.5 F (36.9 C)   TempSrc:  Oral Oral   Resp:      Height:      Weight:  75.8 kg (167 lb 1.7 oz)    SpO2: 97%      Blood pressure 184/90, pulse 91, temperature 98.5 F (36.9 C), temperature source Oral, resp. rate 20, height '5\' 5"'  (1.651 m), weight 75.8 kg (167 lb 1.7 oz), SpO2 97 %.  GEN:  Pleasant  patient lying in the stretcher in no acute distress; cooperative with exam. PSYCH:  alert and oriented x4; does not appear anxious or depressed; affect is appropriate. HEENT: Mucous membranes pink and anicteric; PERRLA; EOM intact; no cervical lymphadenopathy nor thyromegaly or carotid bruit; no JVD; There were no stridor. Neck is very supple. Breasts:: Not examined CHEST WALL: No tenderness CHEST: Normal respiration, clear to auscultation bilaterally.  HEART: Regular rate and rhythm.  There are no murmur, rub, or gallops.   BACK: No kyphosis or scoliosis; no CVA tenderness ABDOMEN: soft and non-tender; no masses, no organomegaly, normal abdominal bowel sounds; no pannus; no intertriginous candida. There is no rebound and no distention. Rectal Exam: Not done EXTREMITIES: No bone or joint deformity; age-appropriate arthropathy of the hands and knees; no edema; no ulcerations.  There is no calf tenderness. Genitalia: not examined PULSES: 2+ and symmetric SKIN: Normal  hydration no rash or ulceration CNS: Cranial nerves 2-12 grossly intact no focal lateralizing neurologic deficit.  Speech is fluent; uvula elevated with phonation, facial symmetry and tongue midline. DTR are normal bilaterally, cerebella exam is intact, barbinski is negative and strengths are equaled bilaterally.  No sensory loss.   Labs on Admission:  Basic Metabolic Panel:  Recent Labs Lab 10/07/15 0330  NA 140  K 3.2*  CL 98*  CO2 25  GLUCOSE 223*  BUN 18  CREATININE 1.36*  CALCIUM 10.5*  MG 1.5*   Liver Function Tests:  Recent Labs Lab 10/07/15 0330  AST 1093*  ALT 557*  ALKPHOS 134*  BILITOT 3.3*  PROT 8.2*  ALBUMIN 4.4    Recent Labs Lab 10/07/15 0330  LIPASE 1609*   CBC:  Recent Labs Lab 10/07/15 0330  WBC 9.5  HGB 15.6  HCT 42.9  MCV 90.7  PLT 317    CBG:  Recent Labs Lab 10/07/15 [place]  159*     Radiological Exams on Admission: Ct Abdomen Pelvis W Contrast  10/07/2015  CLINICAL DATA:  Upper abdominal pain, vomiting and diarrhea after dinner last night. History of diabetes, hyperlipidemia, hypertension and prostate cancer. EXAM: CT ABDOMEN AND PELVIS WITH CONTRAST TECHNIQUE: Multidetector CT imaging of the abdomen and pelvis was performed using the standard protocol following bolus administration of intravenous contrast. CONTRAST:  35m OMNIPAQUE IOHEXOL 300 MG/ML SOLN, 1036mOMNIPAQUE IOHEXOL 300 MG/ML SOLN COMPARISON:  CT abdomen and pelvis January 16, 2007 FINDINGS: LUNG BASES: Included view of the lung bases are clear. The heart is mildly enlarged, no pericardial effusions. Contrast in the distal esophagus associated with small hiatal hernia most consistent with reflux. SOLID ORGANS: At least 2 gallstones measure up to 17 mm with gallbladder wall thickening and pericholecystic inflammation. Patchy hypo enhancement of the pancreatic head, with extensive peripancreatic inflammation, no pseudocyst, pancreatic duct dilatation, pancreatic  calcifications or mass. The liver, spleen, and adrenal glands are unremarkable. GASTROINTESTINAL TRACT: The stomach, small and large bowel are normal in course and caliber without inflammatory changes. Wide necked 3 cm umbilical hernia containing a knuckle of small bowel without inflammatory changes or dilatation of the bowel. 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 Normal appendix. KIDNEYS/ URINARY TRACT: Kidneys are orthotopic, demonstrating symmetric enhancement. No nephrolithiasis, hydronephrosis or solid renal masses. The unopacified ureters are normal in course and caliber. Delayed imaging through the kidneys demonstrates symmetric prompt contrast excretion within the proximal urinary collecting system. Urinary bladder is partially distended and unremarkable. PERITONEUM/RETROPERITONEUM: Moderate amount of free fluid in the retroperitoneum, extending into the mesentery, tracking into the pelvis. Aortoiliac vessels are normal in course and caliber, moderate calcific atherosclerosis. No lymphadenopathy by CT size criteria. Mild prostatomegaly. SOFT TISSUE/OSSEOUS STRUCTURES: Non-suspicious. Small fat containing umbilical hernias. Bridging osteophytes the sacroiliac joints. Degenerative change of lumbar spine resulting in moderate to severe L5-S1 neural foraminal narrowing. IMPRESSION: Acute pancreatitis, hypo enhancing pancreatic head compatible with focal necrotizing pancreatitis. Moderate amount of retroperitoneal free fluid, no abscess. Cholelithiasis with CT findings of cholecystitis likely reactive from acute pancreatitis. Wide necked 3 cm umbilical hernia containing small bowel without CT findings of incarceration or strangulation, no bowel obstruction . Electronically Signed   By: CoElon Alas.D.   On: 10/07/2015 05:05    Assessment/Plan Present on Admission:  . Pancreatitis . Obstructive jaundice . Elevated transaminase level . HTN (hypertension) . HLD  (hyperlipidemia) . Gout . [place] Hypercalcemia . Hypokalemia . Acute pancreatitis  PLAN:   Obtructive cholelithiasis, with cholecystitis and pancreatitis:  Will await GI's recommendation, likely will need ERCP with stone removal.  Would like to continue his Zosyn IV.  Maintain NPO, and change IVF to D5NS with K.   Follow LFTs.   DM:  Will continue with D5, and use SSI.  Hypokalemia:  Supplementation.  Other plans as per orders.  Code Status: FULL COHaskel KhanMD. [facility] Pager [phone] to 7am.  10/07/2015, 9:37 AM

## 2015-10-07 NOTE — Progress Notes (Signed)
MD notified of pt's high BP.  Orders given to increase metoprolol dose.

## 2015-10-07 NOTE — Consult Note (Signed)
Referring Provider: Orvan Falconer, MD Primary Care Physician:  Wardell Honour, MD Primary Gastroenterologist:  [name], MD  Reason for Consultation:  pancreatitis  HPI: Alan [name]. is a 74 y.o. male with history of diabetes, hypertension, hyperlipidemia, prostate cancer who presents early this morning with complaints of acute onset upper abdominal pain associated with vomiting, diarrhea. Never experienced any symptoms like this before. Bowel movements have been regular. Denies blood in the stool or melena. Does have some history of heartburn symptoms. Denies any new medication. Family history of pancreatic disease.  In the emergency department his lipase was 1609, total bilirubin 3.3, alkaline phosphatase 134, AST 1093, ALT 557, calcium 10.5, creatinine 1.36, sodium 140, potassium 3.2. Previously his LFTs were normal one year ago. CT of abdomen pelvis with contrast showed at least 2 gallstones measuring up to 17 mm with gallbladder wall thickening and pericholecystic inflammation. Patchy hypoenhancement of the pancreatic head, extensive peripancreatic inflammation. Findings consistent with focal necrotizing pancreatitis. 3 cm umbilical hernia containing small bowel without CT findings of incarceration or strangulation. No bowel obstruction.  This morning patient states his abdominal pain remains nagging but not as bad as yesterday. He's had no further vomiting in the last few hours.    Prior to Admission medications   Medication Sig Start Date End Date Taking? Authorizing Provider  amLODipine (NORVASC) 10 MG tablet Take 1 tablet (10 mg total) by mouth every evening. 09/08/15  Yes Wardell Honour, MD  aspirin 81 MG tablet Take 81 mg by mouth every evening.    Yes Historical Provider, MD  benazepril (LOTENSIN) 40 MG tablet TAKE ONE TABLET BY MOUTH ONCE DAILY 09/08/15  Yes Wardell Honour, MD  febuxostat (ULORIC) 40 MG tablet Take 2 tablets (80 mg total) by mouth daily. 09/08/15  Yes Wardell Honour, MD  finasteride (PROSCAR) 5 MG tablet Take 1 tablet (5 mg total) by mouth daily. 05/11/15  Yes Wardell Honour, MD  furosemide (LASIX) 40 MG tablet Take one and half po qd 09/08/15  Yes Wardell Honour, MD  metFORMIN (GLUCOPHAGE) 500 MG tablet Take 1 tablet (500 mg total) by mouth 2 (two) times daily with a meal. 03/08/15  Yes Wardell Honour, MD  metoprolol succinate (TOPROL-XL) 100 MG 24 hr tablet Take 1 tablet (100 mg total) by mouth 1 day or 1 dose. 09/08/15  Yes Wardell Honour, MD  Multiple Vitamins-Minerals (MULTIVITAMIN PO) Take 1 tablet by mouth daily.   Yes Historical Provider, MD  niacin (NIASPAN) 1000 MG CR tablet Take 1 tablet (1,000 mg total) by mouth at bedtime. 09/08/15  Yes Wardell Honour, MD  pravastatin (PRAVACHOL) 40 MG tablet Take 1 tablet (40 mg total) by mouth every evening. 09/08/15  Yes Wardell Honour, MD    Current Facility-Administered Medications  Medication Dose Route Frequency Provider Last Rate Last Dose  . 0.9 %  sodium chloride infusion   Intravenous STAT Jola Schmidt, MD   0  at 10/07/15 [phone]  . 0.9 %  sodium chloride infusion   Intravenous Continuous Theressa Millard, MD 75 mL/hr at 10/07/15 0853    . acetaminophen (TYLENOL) tablet 650 mg  650 mg Oral Q6H PRN Theressa Millard, MD       Or  . acetaminophen (TYLENOL) suppository 650 mg  650 mg Rectal Q6H PRN Theressa Millard, MD      . hydrALAZINE (APRESOLINE) injection 5 mg  5 mg Intravenous Q6H PRN Theressa Millard, MD  5 mg at 10/07/15 0849  . HYDROmorphone (DILAUDID) injection 0.5-1 mg  0.5-1 mg Intravenous Q3H PRN Theressa Millard, MD      . insulin aspart (novoLOG) injection 0-9 Units  0-9 Units Subcutaneous 6 times per day Theressa Millard, MD   2 Units at 10/07/15 0848  . iohexol (OMNIPAQUE) 300 MG/ML solution 100 mL  100 mL Intravenous Once PRN Jola Schmidt, MD   100 mL at 10/07/15 0431  . metoprolol (LOPRESSOR) injection 2.5 mg  2.5 mg Intravenous 4 times per day Theressa Millard, MD   2.5 mg at 10/07/15 0849  . morphine 4 MG/ML injection 4 mg  4 mg Intravenous Q1H PRN Jola Schmidt, MD   4 mg at 10/07/15 8341  . ondansetron (ZOFRAN) tablet 4 mg  4 mg Oral Q6H PRN Theressa Millard, MD       Or  . ondansetron (ZOFRAN) injection 4 mg  4 mg Intravenous Q6H PRN Theressa Millard, MD      . pantoprazole (PROTONIX) injection 40 mg  40 mg Intravenous Q12H Theressa Millard, MD   40 mg at 10/07/15 0849  . potassium chloride 10 mEq in 100 mL IVPB  10 mEq Intravenous Q1 Hr x 2 Theressa Millard, MD   10 mEq at 10/07/15 0950    Allergies as of 10/07/2015 - Review Complete 10/07/2015  Allergen Reaction Noted  . Allopurinol Other (See Comments) 05/16/2011  . Colchicine Other (See Comments) 05/16/2011    Past Medical History  Diagnosis Date  . Hypertension   . Heart murmur   . ED (erectile dysfunction)   . Agranulocytosis ([place])   . Diabetes mellitus   . Hyperlipidemia   . Gout   . Adenomatous colon polyp   . Prostate cancer ([place])     Followed by Dr. Jeffie Pollock     Past Surgical History  Procedure Laterality Date  . Colonoscopy  2012    [name]: moderate sigmoid diverticulosis, internal hemorrhoids. next tcs 2017  . Sigmoidoscopy      Family History  Problem Relation Age of Onset  . Stroke Mother   . Hypertension Mother   . Alzheimer's disease Mother   . Hypertension Father   . Cancer Sister     brain  . Cancer Sister     breast  . Colon cancer Neg Hx   . Pancreatic disease Neg Hx     Social History   Social History  . Marital Status: Married    Spouse Name: N/A  . Number of Children: N/A  . Years of Education: N/A   Occupational History  . Not on file.   Social History Main Topics  . Smoking status: Former Smoker    Types: Cigarettes    Quit date: 12/11/1986  . Smokeless tobacco: Never Used  . Alcohol Use: No  . Drug Use: No  . Sexual Activity: Yes   Other Topics Concern  . Not on file   Social History Narrative      ROS:  General: Negative for anorexia, weight loss, fever, chills, fatigue, weakness. Eyes: Negative for vision changes.  ENT: Negative for hoarseness, difficulty swallowing , nasal congestion. CV: Negative for chest pain, angina, palpitations, dyspnea on exertion, peripheral edema.  Respiratory: Negative for dyspnea at rest, dyspnea on exertion, cough, sputum, wheezing.  GI: See history of present illness. GU:  Negative for dysuria, hematuria, urinary incontinence, urinary frequency, nocturnal urination.  MS: Negative for joint pain, low back pain.  Derm: Negative for rash or itching.  Neuro: Negative for weakness, abnormal sensation, seizure, frequent headaches, memory loss, confusion.  Psych: Negative for anxiety, depression, suicidal ideation, hallucinations.  Endo: Negative for unusual weight change.  Heme: Negative for bruising or bleeding. Allergy: Negative for rash or hives.       Physical Examination: Vital signs in last 24 hours: Temp:  [97.7 F (36.5 C)-99.5 F (37.5 C)] 98.5 F (36.9 C) (10/27 0808) Pulse Rate:  [75-95] 91 (10/27 0600) Resp:  [20-24] 20 (10/27 0524) BP: (112-209)/(84-98) 184/90 mmHg (10/27 0849) SpO2:  [76 %-100 %] 97 % (10/27 0600) Weight:  [167 lb 1.7 oz (75.8 kg)-180 lb (81.647 kg)] 167 lb 1.7 oz (75.8 kg) (10/27 1610)    General: Well-nourished, well-developed in no acute distress.  Head: Normocephalic, atraumatic.   Eyes: Conjunctiva pink, no icterus. Mouth: Oropharyngeal mucosa moist and pink , no lesions erythema or exudate. Neck: Supple without thyromegaly, masses, or lymphadenopathy.  Lungs: Clear to auscultation bilaterally.  Heart: Regular rate and rhythm, no murmurs rubs or gallops.  Abdomen: Bowel sounds are normal, mild epigastric/left upper quadrant tenderness, nondistended, no hepatosplenomegaly or masses, no abdominal bruits, no rebound or guarding.  Positive umbilical hernia, easily reducible and nontender Rectal: Not  performed Extremities: No lower extremity edema, clubbing, deformity.  Neuro: Alert and oriented x 4 , grossly normal neurologically.  Skin: Warm and dry, no rash or jaundice.   Psych: Alert and cooperative, normal mood and affect.        Intake/Output from previous day:   Intake/Output this shift:    Lab Results: CBC  Recent Labs  10/07/15 0330  WBC 9.5  HGB 15.6  HCT 42.9  MCV 90.7  PLT 317   BMET  Recent Labs  10/07/15 0330  NA 140  K 3.2*  CL 98*  CO2 25  GLUCOSE 223*  BUN 18  CREATININE 1.36*  CALCIUM 10.5*   LFT  Recent Labs  10/07/15 0330  BILITOT 3.3*  ALKPHOS 134*  AST 1093*  ALT 557*  PROT 8.2*  ALBUMIN 4.4    Lipase  Recent Labs  10/07/15 0330  LIPASE 1609*   Magnesium level I.5 10/07/2015  PT/INR No results for input(s): LABPROT, INR in the last 72 hours.    Imaging Studies: Ct Abdomen Pelvis W Contrast  10/07/2015  CLINICAL DATA:  Upper abdominal pain, vomiting and diarrhea after dinner last night. History of diabetes, hyperlipidemia, hypertension and prostate cancer. EXAM: CT ABDOMEN AND PELVIS WITH CONTRAST TECHNIQUE: Multidetector CT imaging of the abdomen and pelvis was performed using the standard protocol following bolus administration of intravenous contrast. CONTRAST:  14mL OMNIPAQUE IOHEXOL 300 MG/ML SOLN, 13mL OMNIPAQUE IOHEXOL 300 MG/ML SOLN COMPARISON:  CT abdomen and pelvis January 16, 2007 FINDINGS: LUNG BASES: Included view of the lung bases are clear. The heart is mildly enlarged, no pericardial effusions. Contrast in the distal esophagus associated with small hiatal hernia most consistent with reflux. SOLID ORGANS: At least 2 gallstones measure up to 17 mm with gallbladder wall thickening and pericholecystic inflammation. Patchy hypo enhancement of the pancreatic head, with extensive peripancreatic inflammation, no pseudocyst, pancreatic duct dilatation, pancreatic calcifications or mass. The liver, spleen, and adrenal  glands are unremarkable. GASTROINTESTINAL TRACT: The stomach, small and large bowel are normal in course and caliber without inflammatory changes. Wide necked 3 cm umbilical hernia containing a knuckle of small bowel without inflammatory changes or dilatation of the bowel. 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 Normal appendix.  KIDNEYS/ URINARY TRACT: Kidneys are orthotopic, demonstrating symmetric enhancement. No nephrolithiasis, hydronephrosis or solid renal masses. The unopacified ureters are normal in course and caliber. Delayed imaging through the kidneys demonstrates symmetric prompt contrast excretion within the proximal urinary collecting system. Urinary bladder is partially distended and unremarkable. PERITONEUM/RETROPERITONEUM: Moderate amount of free fluid in the retroperitoneum, extending into the mesentery, tracking into the pelvis. Aortoiliac vessels are normal in course and caliber, moderate calcific atherosclerosis. No lymphadenopathy by CT size criteria. Mild prostatomegaly. SOFT TISSUE/OSSEOUS STRUCTURES: Non-suspicious. Small fat containing umbilical hernias. Bridging osteophytes the sacroiliac joints. Degenerative change of lumbar spine resulting in moderate to severe L5-S1 neural foraminal narrowing. IMPRESSION: Acute pancreatitis, hypo enhancing pancreatic head compatible with focal necrotizing pancreatitis. Moderate amount of retroperitoneal free fluid, no abscess. Cholelithiasis with CT findings of cholecystitis likely reactive from acute pancreatitis. Wide necked 3 cm umbilical hernia containing small bowel without CT findings of incarceration or strangulation, no bowel obstruction . Electronically Signed   By: Elon Alas M.D.   On: 10/07/2015 05:05  [4 week]   Impression: 74 year old gentleman who presented with acute onset upper abdominal pain associated with vomiting, diarrhea which began after supper last night. CT findings of focal necrotizing  pancreatitis, cholecystitis, cholelithiasis. LFTs elevated. Suspect biliary pancreatitis. White count has been normal. Patient has received bolus of fluid in the ER. Currently receiving 75 mL per hour. Would plan on aggressively hydrating over the next 12-24 hours to improve pancreatic perfusion. Will follow up abdominal ultrasound to evaluate for common bile duct stone. Current findings are highly suggestive of CBD obstruction, cannot rule out the need for ERCP at this point.   Plan: 1. Aggressive hydration of the next 12-24 hours. 2. Bowel rest. 3. Utilize hydromorphone as opposed to morphine to avoid sphincter dysfunction. 4. Follow-up pending abdominal ultrasound. 5. Replete magnesium per attending.  6. F/u pending labs.   We would like to thank you for the opportunity to participate in the care of Alan [name].Marland Kitchen  Laureen Ochs. Bernarda Caffey [facility] [phone] 10/27/20169:37 AM     LOS: 0 days

## 2015-10-07 NOTE — H&P (Addendum)
[facility] Admission History and Physical       Alan [name]. GDJ:[id number] DOB: December 12, 1940 DOA: 10/07/2015  Referring physician: EDP PCP: Wardell Honour, MD  Specialists:   Chief Complaint: ABD Pain Nausea and Vomiting and Diarrhea  HPI: Alan Nohr. is a 74 y.o. male with a history of HTN, Hyperlipidemia DM2, Gout who presents to the ED with complaints of RUQ ABD Pain, N+V+D that started after he had dinner.  He denies any hematemesis, or melena or hematochezia.   He also denies fevers and chills.  He was evaluated in the ED and was found to have elevated Lipase of 1608, and elevated LFTs.   Patient was referred for admission.     Review of Systems:  Constitutional: No Weight Loss, No Weight Gain, Night Sweats, Fevers, Chills, Dizziness, Light Headedness, Fatigue, or Generalized Weakness HEENT: No Headaches, Difficulty Swallowing,Tooth/Dental Problems,Sore Throat,  No Sneezing, Rhinitis, Ear Ache, Nasal Congestion, or Post Nasal Drip,  Cardio-vascular:  No Chest pain, Orthopnea, PND, Edema in Lower Extremities, Anasarca, Dizziness, Palpitations  Resp: No Dyspnea, No DOE, No Productive Cough, No Non-Productive Cough, No Hemoptysis, No Wheezing.    GI: No Heartburn, Indigestion, +Abdominal Pain, +Nausea, +Vomiting, +Diarrhea, Constipation, Hematemesis, Hematochezia, Melena, Change in Bowel Habits,  Loss of Appetite  GU: No Dysuria, No Change in Color of Urine, No Urgency or Urinary Frequency, No Flank pain.  Musculoskeletal: No Joint Pain or Swelling, No Decreased Range of Motion, No Back Pain.  Neurologic: No Syncope, No Seizures, Muscle Weakness, Paresthesia, Vision Disturbance or Loss, No Diplopia, No Vertigo, No Difficulty Walking,  Skin: No Rash or Lesions. Psych: No Change in Mood or Affect, No Depression or Anxiety, No Memory loss, No Confusion, or Hallucinations   Past Medical History  Diagnosis Date  . Hypertension   . Heart murmur   . ED (erectile  dysfunction)   . Agranulocytosis ([place])   . Diabetes mellitus   . Hyperlipidemia   . Gout   . Adenomatous colon polyp   . Prostate cancer ([place])     Followed by Dr. Jeffie [name]      Past Surgical History  Procedure Laterality Date  . Colonoscopy    . Sigmoidoscopy        Prior to Admission medications   Medication Sig Start Date End Date Taking? Authorizing Provider  amLODipine (NORVASC) 10 MG tablet Take 1 tablet (10 mg total) by mouth every evening. 09/08/15  Yes Wardell Honour, MD  aspirin 81 MG tablet Take 81 mg by mouth every evening.    Yes Historical Provider, MD  benazepril (LOTENSIN) 40 MG tablet TAKE ONE TABLET BY MOUTH ONCE DAILY 09/08/15  Yes Wardell Honour, MD  febuxostat (ULORIC) 40 MG tablet Take 2 tablets (80 mg total) by mouth daily. 09/08/15  Yes Wardell Honour, MD  finasteride (PROSCAR) 5 MG tablet Take 1 tablet (5 mg total) by mouth daily. 05/11/15  Yes Wardell Honour, MD  furosemide (LASIX) 40 MG tablet Take one and half po qd 09/08/15  Yes Wardell Honour, MD  metFORMIN (GLUCOPHAGE) 500 MG tablet Take 1 tablet (500 mg total) by mouth 2 (two) times daily with a meal. 03/08/15  Yes Wardell Honour, MD  metoprolol succinate (TOPROL-XL) 100 MG 24 hr tablet Take 1 tablet (100 mg total) by mouth 1 day or 1 dose. 09/08/15  Yes Wardell Honour, MD  Multiple Vitamins-Minerals (MULTIVITAMIN PO) Take 1 tablet by mouth daily.   Yes Historical Provider, MD  niacin (NIASPAN) 1000 MG CR tablet Take 1 tablet (1,000 mg total) by mouth at bedtime. 09/08/15  Yes Wardell Honour, MD  pravastatin (PRAVACHOL) 40 MG tablet Take 1 tablet (40 mg total) by mouth every evening. 09/08/15  Yes Wardell Honour, MD     Allergies  Allergen Reactions  . Allopurinol Other (See Comments)    Significantly decreased WBC's  . Colchicine Other (See Comments)    Significantly decreased WBC's    Social History:  reports that he quit smoking about 28 years ago. His smoking use included Cigarettes.  He has never used smokeless tobacco. He reports that he does not drink alcohol or use illicit drugs.    Family History  Problem Relation Age of Onset  . Stroke Mother   . Hypertension Mother   . Alzheimer's disease Mother   . Hypertension Father   . Cancer Sister     brain  . Cancer Sister        Physical Exam:  GEN:  Pleasant Elderly Well Nourished and Well Developed  74 y.o. African American male examined and in no acute distress; cooperative with exam Filed Vitals:   10/07/15 0311 10/07/15 0400 10/07/15 0445 10/07/15 0524  BP:  209/84 121/92 112/98  Pulse: 75 95 86 84  Temp: 97.7 F (36.5 C)     TempSrc: Oral     Resp: 24   20  Height: 5\' 5"  (1.651 m)     Weight: 81.647 kg (180 lb)     SpO2: 100% 76% 100% 100%   Blood pressure 112/98, pulse 84, temperature 97.7 F (36.5 C), temperature source Oral, resp. rate 20, height 5\' 5"  (1.651 m), weight 81.647 kg (180 lb), SpO2 100 %. PSYCH: He is alert and oriented x4; does not appear anxious does not appear depressed; affect is normal HEENT: Normocephalic and Atraumatic, Mucous membranes pink; PERRLA; EOM intact; Fundi:  Benign;  No scleral icterus, Nares: Patent, Oropharynx: Clear,     Neck:  FROM, No Cervical Lymphadenopathy nor Thyromegaly or Carotid Bruit; No JVD; Breasts:: Not examined CHEST WALL: No tenderness CHEST: Normal respiration, clear to auscultation bilaterally HEART: Regular rate and rhythm; no murmurs rubs or gallops BACK: No kyphosis or scoliosis; No CVA tenderness ABDOMEN: Positive Bowel Sounds, Soft Non-Tender, No Rebound or Guarding; No Masses, No Organomegaly.  No Murphy's sign.    Rectal Exam: Not done EXTREMITIES: No Cyanosis, Clubbing, or Edema; No Ulcerations. Genitalia: not examined PULSES: 2+ and symmetric SKIN: Normal hydration no rash or ulceration CNS:  Alert and Oriented x 4, No Focal Deficits Vascular: pulses palpable throughout    Labs on Admission:  Basic Metabolic Panel:  Recent  Labs Lab 10/07/15 0330  NA 140  K 3.2*  CL 98*  CO2 25  GLUCOSE 223*  BUN 18  CREATININE 1.36*  CALCIUM 10.5*   Liver Function Tests:  Recent Labs Lab 10/07/15 0330  AST 1093*  ALT 557*  ALKPHOS 134*  BILITOT 3.3*  PROT 8.2*  ALBUMIN 4.4    Recent Labs Lab 10/07/15 0330  LIPASE 1609*   No results for input(s): AMMONIA in the last 168 hours. CBC:  Recent Labs Lab 10/07/15 0330  WBC 9.5  HGB 15.6  HCT 42.9  MCV 90.7  PLT 317   Cardiac Enzymes: No results for input(s): CKTOTAL, CKMB, CKMBINDEX, TROPONINI in the last 168 hours.  BNP (last 3 results) No results for input(s): BNP in the last 8760 hours.  ProBNP (last 3 results) No results for  input(s): PROBNP in the last 8760 hours.  CBG: No results for input(s): GLUCAP in the last 168 hours.  Radiological Exams on Admission: Ct Abdomen Pelvis W Contrast  10/07/2015  CLINICAL DATA:  Upper abdominal pain, vomiting and diarrhea after dinner last night. History of diabetes, hyperlipidemia, hypertension and prostate cancer. EXAM: CT ABDOMEN AND PELVIS WITH CONTRAST TECHNIQUE: Multidetector CT imaging of the abdomen and pelvis was performed using the standard protocol following bolus administration of intravenous contrast. CONTRAST:  26mL OMNIPAQUE IOHEXOL 300 MG/ML SOLN, 17mL OMNIPAQUE IOHEXOL 300 MG/ML SOLN COMPARISON:  CT abdomen and pelvis January 16, 2007 FINDINGS: LUNG BASES: Included view of the lung bases are clear. The heart is mildly enlarged, no pericardial effusions. Contrast in the distal esophagus associated with small hiatal hernia most consistent with reflux. SOLID ORGANS: At least 2 gallstones measure up to 17 mm with gallbladder wall thickening and pericholecystic inflammation. Patchy hypo enhancement of the pancreatic head, with extensive peripancreatic inflammation, no pseudocyst, pancreatic duct dilatation, pancreatic calcifications or mass. The liver, spleen, and adrenal glands are unremarkable.  GASTROINTESTINAL TRACT: The stomach, small and large bowel are normal in course and caliber without inflammatory changes. Wide necked 3 cm umbilical hernia containing a knuckle of small bowel without inflammatory changes or dilatation of the bowel. 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 Normal appendix. KIDNEYS/ URINARY TRACT: Kidneys are orthotopic, demonstrating symmetric enhancement. No nephrolithiasis, hydronephrosis or solid renal masses. The unopacified ureters are normal in course and caliber. Delayed imaging through the kidneys demonstrates symmetric prompt contrast excretion within the proximal urinary collecting system. Urinary bladder is partially distended and unremarkable. PERITONEUM/RETROPERITONEUM: Moderate amount of free fluid in the retroperitoneum, extending into the mesentery, tracking into the pelvis. Aortoiliac vessels are normal in course and caliber, moderate calcific atherosclerosis. No lymphadenopathy by CT size criteria. Mild prostatomegaly. SOFT TISSUE/OSSEOUS STRUCTURES: Non-suspicious. Small fat containing umbilical hernias. Bridging osteophytes the sacroiliac joints. Degenerative change of lumbar spine resulting in moderate to severe L5-S1 neural foraminal narrowing. IMPRESSION: Acute pancreatitis, hypo enhancing pancreatic head compatible with focal necrotizing pancreatitis. Moderate amount of retroperitoneal free fluid, no abscess. Cholelithiasis with CT findings of cholecystitis likely reactive from acute pancreatitis. Wide necked 3 cm umbilical hernia containing small bowel without CT findings of incarceration or strangulation, no bowel obstruction . Electronically Signed   By: Elon Alas M.D.   On: 10/07/2015 05:05           Assessment/Plan:      74 y.o. male with  Active Problems:   1.    Pancreatitis- Acute Pancreatitis- Etology Obstruction or Stricture, on CT scan   NPO, Bowel Rest   IVFs   Anti-Emetics PRN   GI consulted for ERCP vs  MRCP   Monitor Lipase Levels     2.    Obstructive jaundice- same as #1, +Cholelithiasis on CT scan   Ultrasound in AM   Monitor LFTs     3.    Elevated transaminase level- Same as #1   Monitor LFTs     4.    Hypercalcemia   IVFs   Monitor Calcium levels     5.    Hypokalemia   Replace with IV KCl   Check Magensium     6.    HTN (hypertension)   Monitor BPs   PRN IV Hydralazine for SBP > 170     7.    HLD (hyperlipidemia)   On Pravastatin Rx    Resume when able to take PO  8.    Diabetes ([place])   Hold Metformin   SSI coverage PRN       9.    Gout   Stable for now   On Uloric Rx     10.   Prostate cancer [facility])   Followed By Urology Dr Alan [name]   On Finasteride Rx    11. DVT Prophylaxis   SCDs    Code Status:     FULL CODE     Family Communication:   Son at Bedside     Disposition Plan:    Inpatient Status        Time spent:  [facility] Pager [phone]   If [facility] Please Contact the Day Rounding Team MD for [facility]  If 7PM-7AM, Please Contact Night-Floor Coverage  [email or web address] Password [email or web address] 10/07/2015, 6:00 AM     ADDENDUM:   Patient was seen and examined on 10/07/2015

## 2015-10-07 NOTE — ED Notes (Signed)
Pt states he started feeling sick after eating supper last night, upper abd pain and vomiting, and diarrhea

## 2015-10-07 NOTE — Progress Notes (Signed)
ANTIBIOTIC CONSULT NOTE - INITIAL  Pharmacy Consult for Zosyn Indication: intrabdominal infection  Allergies  Allergen Reactions  . Allopurinol Other (See Comments)    Significantly decreased WBC's  . Colchicine Other (See Comments)    Significantly decreased WBC's    Patient Measurements: Height: 5\' 5"  (165.1 cm) Weight: 167 lb 1.7 oz (75.8 kg) IBW/kg (Calculated) : 61.5   Vital Signs: Temp: 98.5 F (36.9 C) (10/27 0808) Temp Source: Oral (10/27 0808) BP: 184/90 mmHg (10/27 0849) Pulse Rate: 91 (10/27 0600) Intake/Output from previous day:   Intake/Output from this shift: Total I/O In: -  Out: 200 [Urine:200]  Labs:  Recent Labs  10/07/15 0330  WBC 9.5  HGB 15.6  PLT 317  CREATININE 1.36*   Estimated Creatinine Clearance: 46.7 mL/min (by C-G formula based on Cr of 1.36). No results for input(s): VANCOTROUGH, VANCOPEAK, VANCORANDOM, GENTTROUGH, GENTPEAK, GENTRANDOM, TOBRATROUGH, TOBRAPEAK, TOBRARND, AMIKACINPEAK, AMIKACINTROU, AMIKACIN in the last 72 hours.   Microbiology: No results found for this or any previous visit (from the past 720 hour(s)).  Medical History: Past Medical History  Diagnosis Date  . Hypertension   . Heart murmur   . ED (erectile dysfunction)   . Agranulocytosis ([place])   . Diabetes mellitus   . Hyperlipidemia   . Gout   . Adenomatous colon polyp   . Prostate cancer ([place])     Followed by Dr. Jeffie Pollock     Medications:  Prescriptions prior to admission  Medication Sig Dispense Refill Last Dose  . amLODipine (NORVASC) 10 MG tablet Take 1 tablet (10 mg total) by mouth every evening. 90 tablet 2 10/06/2015 at Unknown time  . aspirin 81 MG tablet Take 81 mg by mouth every evening.    Past Week at Unknown time  . benazepril (LOTENSIN) 40 MG tablet TAKE ONE TABLET BY MOUTH ONCE DAILY 90 tablet 2 Past Week at Unknown time  . febuxostat (ULORIC) 40 MG tablet Take 2 tablets (80 mg total) by mouth daily. 60 tablet 3 Past Week at Unknown time   . finasteride (PROSCAR) 5 MG tablet Take 1 tablet (5 mg total) by mouth daily. 90 tablet 0 Past Week at Unknown time  . furosemide (LASIX) 40 MG tablet Take one and half po qd 135 tablet 2 Past Week at Unknown time  . metFORMIN (GLUCOPHAGE) 500 MG tablet Take 1 tablet (500 mg total) by mouth 2 (two) times daily with a meal. 180 tablet 2 Past Week at Unknown time  . metoprolol succinate (TOPROL-XL) 100 MG 24 hr tablet Take 1 tablet (100 mg total) by mouth 1 day or 1 dose. 90 tablet 2 Past Week at 2200  . Multiple Vitamins-Minerals (MULTIVITAMIN PO) Take 1 tablet by mouth daily.   Past Week at Unknown time  . niacin (NIASPAN) 1000 MG CR tablet Take 1 tablet (1,000 mg total) by mouth at bedtime. 30 tablet 3 Past Week at Unknown time  . pravastatin (PRAVACHOL) 40 MG tablet Take 1 tablet (40 mg total) by mouth every evening. 90 tablet 2 Past Week at Unknown time   Assessment: 74 yo male with h/o DM, HTN, prostate CA, HLD, Gout admitted for  obstructive cholelithiasis, with cholecystitis and pancreatitis.   Goal of Therapy:  resolution of infection  Plan:  Zosyn 3.375gm iv q8h, extended dosing interval Follow up culture results  Monitor labs and v/s  Trenton Gammon, Lekendrick Alpern L 10/07/2015,11:05 AM

## 2015-10-08 DIAGNOSIS — R17 Unspecified jaundice: Secondary | ICD-10-CM | POA: Insufficient documentation

## 2015-10-08 DIAGNOSIS — K8591 Acute pancreatitis with uninfected necrosis, unspecified: Secondary | ICD-10-CM

## 2015-10-08 DIAGNOSIS — R740 Nonspecific elevation of levels of transaminase and lactic acid dehydrogenase [LDH]: Secondary | ICD-10-CM

## 2015-10-08 DIAGNOSIS — K838 Other specified diseases of biliary tract: Secondary | ICD-10-CM

## 2015-10-08 LAB — CBC
HCT: 40.6 % (ref 39.0–52.0)
Hemoglobin: 14.1 g/dL (ref 13.0–17.0)
MCH: 32.5 pg (ref 26.0–34.0)
MCHC: 34.7 g/dL (ref 30.0–36.0)
MCV: 93.5 fL (ref 78.0–100.0)
PLATELETS: 268 10*3/uL (ref 150–400)
RBC: 4.34 MIL/uL (ref 4.22–5.81)
RDW: 14.1 % (ref 11.5–15.5)
WBC: 17.1 10*3/uL — ABNORMAL HIGH (ref 4.0–10.5)

## 2015-10-08 LAB — HEPATIC FUNCTION PANEL
ALT: 296 U/L — ABNORMAL HIGH (ref 17–63)
AST: 158 U/L — ABNORMAL HIGH (ref 15–41)
Albumin: 3 g/dL — ABNORMAL LOW (ref 3.5–5.0)
Alkaline Phosphatase: 95 U/L (ref 38–126)
Bilirubin, Direct: 0.7 mg/dL — ABNORMAL HIGH (ref 0.1–0.5)
Indirect Bilirubin: 1.9 mg/dL — ABNORMAL HIGH (ref 0.3–0.9)
Total Bilirubin: 2.6 mg/dL — ABNORMAL HIGH (ref 0.3–1.2)
Total Protein: 6.2 g/dL — ABNORMAL LOW (ref 6.5–8.1)

## 2015-10-08 LAB — BASIC METABOLIC PANEL
Anion gap: 8 (ref 5–15)
BUN: 14 mg/dL (ref 6–20)
CHLORIDE: 108 mmol/L (ref 101–111)
CO2: 26 mmol/L (ref 22–32)
CREATININE: 1.04 mg/dL (ref 0.61–1.24)
Calcium: 8.6 mg/dL — ABNORMAL LOW (ref 8.9–10.3)
GFR calc Af Amer: >60 mL/min (ref >60)
GFR calc non Af Amer: >60 mL/min (ref >60)
Glucose, Bld: 144 mg/dL — ABNORMAL HIGH (ref 65–99)
Potassium: 3.4 mmol/L — ABNORMAL LOW (ref 3.5–5.1)
SODIUM: 142 mmol/L (ref 135–145)

## 2015-10-08 LAB — GLUCOSE, CAPILLARY
GLUCOSE-CAPILLARY: 120 mg/dL — AB (ref 65–99)
GLUCOSE-CAPILLARY: 137 mg/dL — AB (ref 65–99)
Glucose-Capillary: 120 mg/dL — ABNORMAL HIGH (ref 65–99)
Glucose-Capillary: 139 mg/dL — ABNORMAL HIGH (ref 65–99)
Glucose-Capillary: 127 mg/dL — ABNORMAL HIGH (ref 65–99)

## 2015-10-08 LAB — LIPASE, BLOOD: Lipase: 359 U/L — ABNORMAL HIGH (ref 11–51)

## 2015-10-08 MED ORDER — KCL IN DEXTROSE-NACL 20-5-0.9 MEQ/L-%-% IV SOLN
INTRAVENOUS | Status: AC
  Administered 2015-10-08 – 2015-10-13 (×7): via INTRAVENOUS

## 2015-10-08 NOTE — Plan of Care (Signed)
Problem: Acute Rehab PT Goals(only PT should resolve) Goal: Patient Will Transfer Sit To/From Stand Pt will transfer 5x sit to/from-stand without UE support at ModI and without loss-of-balance in less than 20 seconds to demonstrate good safety awareness and strength recovery for independent mobility in home.     Goal: Pt Will Ambulate Pt will ambulate without AD independently using a step-through pattern and equal step length for a distances greater than 367ft and gait speed greater than 1.110m/s to demonstrate the ability to perform safe limited community  distance ambulation at discharge.

## 2015-10-08 NOTE — Progress Notes (Signed)
Subjective: Feels better today, continued abdominal pain 7/10 before pain medication this morning. No N/V. Denies GI bleed. No other GI complaints.  Objective: Vital signs in last 24 hours: Temp:  [98.6 F (37 C)-99.5 F (37.5 C)] 99.5 F (37.5 C) (10/28 0300) Pulse Rate:  [72-101] 72 (10/28 0300) Resp:  [18-26] 23 (10/28 0300) BP: (149-202)/(62-151) 175/62 mmHg (10/28 0300) SpO2:  [94 %-97 %] 95 % (10/28 0300)   General:   Alert and oriented, pleasant Head:  Normocephalic and atraumatic. Eyes:  No icterus, sclera clear. Conjuctiva pink.  Heart:  S1, S2 present, no murmurs noted.  Lungs: Clear to auscultation bilaterally, without wheezing, rales, or rhonchi.  Abdomen:  Bowel sounds present, soft, non-distended. Mild to moderate abdominal TTP mid-abdomen. No HSM or hernias noted. No rebound or guarding. No masses appreciated  Extremities:  Without clubbing or edema. Neurologic:  Alert and  oriented x4;  grossly normal neurologically. Skin:  Warm and dry, intact without significant lesions.  Psych:  Alert and cooperative. Normal mood and affect.  Intake/Output from previous day: 10/27 0701 - 10/28 0700 In: 100 [IV Piggyback:100] Out: 1500 [Urine:1500] Intake/Output this shift:    Lab Results:  Recent Labs  10/07/15 0330 10/08/15 0524  WBC 9.5 17.1*  HGB 15.6 14.1  HCT 42.9 40.6  PLT 317 268   BMET  Recent Labs  10/07/15 0330 10/07/15 1540 10/08/15 0524  NA 140 141 142  K 3.2* 3.7 3.4*  CL 98* 106 108  CO2 '25 25 26  ' GLUCOSE 223* 198* 144*  BUN '18 16 14  ' CREATININE 1.36* 1.13 1.04  CALCIUM 10.5* 9.2 8.6*   LFT  Recent Labs  10/07/15 0330 10/08/15 0524  PROT 8.2* 6.2*  ALBUMIN 4.4 3.0*  AST 1093* 158*  ALT 557* 296*  ALKPHOS 134* 95  BILITOT 3.3* 2.6*  BILIDIR  --  0.7*  IBILI  --  1.9*   PT/INR No results for input(s): LABPROT, INR in the last 72 hours. Hepatitis Panel No results for input(s): HEPBSAG, HCVAB, HEPAIGM, HEPBIGM in the last  72 hours.   Studies/Results: US Abdomen Complete  10/07/2015  CLINICAL DATA:  Obstructive jaundice. EXAM: ULTRASOUND ABDOMEN COMPLETE COMPARISON:  Abdomen CT dated 10/07/2015. FINDINGS: Gallbladder: Multiple gallstones in the gallbladder measuring up to 1.7 cm in diameter each. Diffuse gallbladder wall thickening with a maximum thickness of 9 mm. No pericholecystic fluid. The patient was not focally tender over the gallbladder. Common bile duct: Diameter: 9.2 mm Liver: No focal lesion identified. Within normal limits in parenchymal echogenicity. IVC: No abnormality visualized. Pancreas: Poorly visualized. Ill-defined decreased echogenicity in the pancreatic head, similar to the recent CT. Spleen: Size and appearance within normal limits. Right Kidney: Length: 10.4 cm. Echogenicity within normal limits. No mass or hydronephrosis visualized. Left Kidney: Length: 12.4 cm. Echogenicity within normal limits. No mass or hydronephrosis visualized. Abdominal aorta: No aneurysm visualized. Other findings: None. IMPRESSION: 1. Findings compatible with acute pancreatitis involving the pancreatic head. 2. Cholelithiasis. 3. 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 4. Biliary ductal dilatation, possibly due to a nonvisualized distal common duct stone or stones. This could also be related to the acute pancreatitis. Electronically Signed   By: Claudie Revering M.D.   On: 10/07/2015 15:25   Ct Abdomen Pelvis W Contrast  10/07/2015  CLINICAL DATA:  Upper abdominal pain, vomiting and diarrhea after dinner last night. History of diabetes, hyperlipidemia, hypertension and prostate cancer. EXAM: CT ABDOMEN AND PELVIS WITH CONTRAST  TECHNIQUE: Multidetector CT imaging of the abdomen and pelvis was performed using the standard protocol following bolus administration of intravenous contrast. CONTRAST:  74m OMNIPAQUE IOHEXOL 300 MG/ML SOLN, 1039mOMNIPAQUE IOHEXOL 300 MG/ML SOLN COMPARISON:  CT  abdomen and pelvis January 16, 2007 FINDINGS: LUNG BASES: Included view of the lung bases are clear. The heart is mildly enlarged, no pericardial effusions. Contrast in the distal esophagus associated with small hiatal hernia most consistent with reflux. SOLID ORGANS: At least 2 gallstones measure up to 17 mm with gallbladder wall thickening and pericholecystic inflammation. Patchy hypo enhancement of the pancreatic head, with extensive peripancreatic inflammation, no pseudocyst, pancreatic duct dilatation, pancreatic calcifications or mass. The liver, spleen, and adrenal glands are unremarkable. GASTROINTESTINAL TRACT: The stomach, small and large bowel are normal in course and caliber without inflammatory changes. Wide necked 3 cm umbilical hernia containing a knuckle of small bowel without inflammatory changes or dilatation of the bowel. 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 Normal appendix. KIDNEYS/ URINARY TRACT: Kidneys are orthotopic, demonstrating symmetric enhancement. No nephrolithiasis, hydronephrosis or solid renal masses. The unopacified ureters are normal in course and caliber. Delayed imaging through the kidneys demonstrates symmetric prompt contrast excretion within the proximal urinary collecting system. Urinary bladder is partially distended and unremarkable. PERITONEUM/RETROPERITONEUM: Moderate amount of free fluid in the retroperitoneum, extending into the mesentery, tracking into the pelvis. Aortoiliac vessels are normal in course and caliber, moderate calcific atherosclerosis. No lymphadenopathy by CT size criteria. Mild prostatomegaly. SOFT TISSUE/OSSEOUS STRUCTURES: Non-suspicious. Small fat containing umbilical hernias. Bridging osteophytes the sacroiliac joints. Degenerative change of lumbar spine resulting in moderate to severe L5-S1 neural foraminal narrowing. IMPRESSION: Acute pancreatitis, hypo enhancing pancreatic head compatible with focal necrotizing  pancreatitis. Moderate amount of retroperitoneal free fluid, no abscess. Cholelithiasis with CT findings of cholecystitis likely reactive from acute pancreatitis. Wide necked 3 cm umbilical hernia containing small bowel without CT findings of incarceration or strangulation, no bowel obstruction . Electronically Signed   By: CoElon Alas.D.   On: 10/07/2015 05:05    Assessment: [age]ear old gentleman who presented with acute onset upper abdominal pain associated with vomiting, diarrhea which began after supper last night. CT findings of focal necrotizing pancreatitis, cholecystitis, cholelithiasis. LFTs elevated. Suspect biliary pancreatitis. White count has been normal. Patient has received bolus of fluid in the ER. Was aggressively hydrated yesterday. Currently receiving   Abdominal U/S demonstrated cholelithiasis with diffuse gallbladder wall thickening likely chronci cholelithiasis, biliary ductal dilation possibly due to nonvisualized distal common duct stone or acute pancreatitis.  After discussion with radiology deemed likely passed CBD stone. This is supported by his labs this morning. Lipase decreased significantly from 1609 to 359, AST/ALT decreased from 1093/557 to 158/296, alk phos decrease from 134 to 95, total bili decreased from 3.3/2.6. BUN decreased from 16 to 14 and Hct from 42.9 to 40.6 s/p hydration. Can decrease fluids at this time given improvement in labs   Plan: 1. Continue suppoprtive measures 2. Decrease fluids to maintenance rate per hospitalist 3. Recommend surgical consult for possible cholecystectomy after discharge in setting of likely passed gallstone pancreatitis    ErWalden FieldAGNP-C Adult & Gerontological Nurse Practitioner [facility]    LOS: 1 day    10/08/2015, 10:07 AM

## 2015-10-08 NOTE — Progress Notes (Signed)
[facility] PROGRESS NOTE  [name] BUL:[id number] DOB: Apr 10, 1941    PCP:   Wardell Honour, MD   Patient is a 74 yo male with hx of DM, HTN, prostate cancer, HLD, Gout, admitted by Dr Arnoldo Morale for obstructive cholelithiasis, pancreatitis, and cholecystitis. He has been NPO, and GI has been consulted. He likely has passed his stone, as his LFTs has improved markedly and so was his Lipase.  GI doesn't think we need ERCP nor MRCP.    Rewiew of Systems:  Constitutional: Negative for malaise, fever and chills. No significant weight loss or weight gain Eyes: Negative for eye pain, redness and discharge, diplopia, visual changes, or flashes of light. ENMT: Negative for ear pain, hoarseness, nasal congestion, sinus pressure and sore throat. No headaches; tinnitus, drooling, or problem swallowing. Cardiovascular: Negative for chest pain, palpitations, diaphoresis, dyspnea and peripheral edema. ; No orthopnea, PND Respiratory: Negative for cough, hemoptysis, wheezing and stridor. No pleuritic chestpain. Gastrointestinal: Negative for nausea, vomiting, diarrhea, constipation, abdominal pain, melena, blood in stool, hematemesis, jaundice and rectal bleeding.    Genitourinary: Negative for frequency, dysuria, incontinence,flank pain and hematuria; Musculoskeletal: Negative for back pain and neck pain. Negative for swelling and trauma.;  Skin: . Negative for pruritus, rash, abrasions, bruising and skin lesion.; ulcerations Neuro: Negative for headache, lightheadedness and neck stiffness. Negative for weakness, altered level of consciousness , altered mental status, extremity weakness, burning feet, involuntary movement, seizure and syncope.  Psych: negative for anxiety, depression, insomnia, tearfulness, panic attacks, hallucinations, paranoia, suicidal or homicidal ideation    Past Medical History  Diagnosis Date  . Hypertension   . Heart murmur   . ED (erectile dysfunction)   .  Agranulocytosis ([place])   . Diabetes mellitus   . Hyperlipidemia   . Gout   . Adenomatous colon polyp   . Prostate cancer ([place])     Followed by Dr. Jeffie Pollock     Past Surgical History  Procedure Laterality Date  . Colonoscopy  2012    [name]: moderate sigmoid diverticulosis, internal hemorrhoids. next tcs 2017  . Sigmoidoscopy      Medications:  HOME MEDS: Prior to Admission medications   Medication Sig Start Date End Date Taking? Authorizing Provider  amLODipine (NORVASC) 10 MG tablet Take 1 tablet (10 mg total) by mouth every evening. 09/08/15  Yes Wardell Honour, MD  aspirin 81 MG tablet Take 81 mg by mouth every evening.    Yes Historical Provider, MD  benazepril (LOTENSIN) 40 MG tablet TAKE ONE TABLET BY MOUTH ONCE DAILY 09/08/15  Yes Wardell Honour, MD  febuxostat (ULORIC) 40 MG tablet Take 2 tablets (80 mg total) by mouth daily. 09/08/15  Yes Wardell Honour, MD  finasteride (PROSCAR) 5 MG tablet Take 1 tablet (5 mg total) by mouth daily. 05/11/15  Yes Wardell Honour, MD  furosemide (LASIX) 40 MG tablet Take one and half po qd 09/08/15  Yes Wardell Honour, MD  metFORMIN (GLUCOPHAGE) 500 MG tablet Take 1 tablet (500 mg total) by mouth 2 (two) times daily with a meal. 03/08/15  Yes Wardell Honour, MD  metoprolol succinate (TOPROL-XL) 100 MG 24 hr tablet Take 1 tablet (100 mg total) by mouth 1 day or 1 dose. 09/08/15  Yes Wardell Honour, MD  Multiple Vitamins-Minerals (MULTIVITAMIN PO) Take 1 tablet by mouth daily.   Yes Historical Provider, MD  niacin (NIASPAN) 1000 MG CR tablet Take 1 tablet (1,000 mg total) by mouth at bedtime. 09/08/15  Yes Wardell Honour, MD  pravastatin (PRAVACHOL) 40 MG tablet Take 1 tablet (40 mg total) by mouth every evening. 09/08/15  Yes Wardell Honour, MD     Allergies:  Allergies  Allergen Reactions  . Allopurinol Other (See Comments)    Significantly decreased WBC's  . Colchicine Other (See Comments)    Significantly decreased WBC's     Social History:   reports that he quit smoking about 28 years ago. His smoking use included Cigarettes. He has never used smokeless tobacco. He reports that he does not drink alcohol or use illicit drugs.  Family History: Family History  Problem Relation Age of Onset  . Stroke Mother   . Hypertension Mother   . Alzheimer's disease Mother   . Hypertension Father   . Cancer Sister     brain  . Cancer Sister     breast  . Colon cancer Neg Hx   . Pancreatic disease Neg Hx      Physical Exam: Filed Vitals:   10/08/15 1050 10/08/15 1117 10/08/15 1203 10/08/15 1431  BP: 182/70 197/81 185/67 180/78  Pulse: 74 87 80 98  Temp: 99.1 F (37.3 C)   99.5 F (37.5 C)  TempSrc: Oral   Oral  Resp: 20  20 20   Height:      Weight:      SpO2: 96%  94% 95%   Blood pressure 180/78, pulse 98, temperature 99.5 F (37.5 C), temperature source Oral, resp. rate 20, height 5\' 5"  (1.651 m), weight 75.8 kg (167 lb 1.7 oz), SpO2 95 %.  GEN:  Pleasant  patient lying in the stretcher in no acute distress; cooperative with exam. PSYCH:  alert and oriented x4; does not appear anxious or depressed; affect is appropriate. HEENT: Mucous membranes pink and anicteric; PERRLA; EOM intact; no cervical lymphadenopathy nor thyromegaly or carotid bruit; no JVD; There were no stridor. Neck is very supple. Breasts:: Not examined CHEST WALL: No tenderness CHEST: Normal respiration, clear to auscultation bilaterally.  HEART: Regular rate and rhythm.  There are no murmur, rub, or gallops.   BACK: No kyphosis or scoliosis; no CVA tenderness ABDOMEN: soft and non-tender; no masses, no organomegaly, normal abdominal bowel sounds; no pannus; no intertriginous candida. There is no rebound and no distention. Rectal Exam: Not done EXTREMITIES: No bone or joint deformity; age-appropriate arthropathy of the hands and knees; no edema; no ulcerations.  There is no calf tenderness. Genitalia: not examined PULSES: 2+ and  symmetric SKIN: Normal hydration no rash or ulceration CNS: Cranial nerves 2-12 grossly intact no focal lateralizing neurologic deficit.  Speech is fluent; uvula elevated with phonation, facial symmetry and tongue midline. DTR are normal bilaterally, cerebella exam is intact, barbinski is negative and strengths are equaled bilaterally.  No sensory loss.   Labs on Admission:  Basic Metabolic Panel:  Recent Labs Lab 10/07/15 0330 10/07/15 1540 10/08/15 0524  NA 140 141 142  K 3.2* 3.7 3.4*  CL 98* 106 108  CO2 25 25 26   GLUCOSE 223* 198* 144*  BUN 18 16 14   CREATININE 1.36* 1.13 1.04  CALCIUM 10.5* 9.2 8.6*  MG 1.5*  --   --    Liver Function Tests:  Recent Labs Lab 10/07/15 0330 10/08/15 0524  AST 1093* 158*  ALT 557* 296*  ALKPHOS 134* 95  BILITOT 3.3* 2.6*  PROT 8.2* 6.2*  ALBUMIN 4.4 3.0*    Recent Labs Lab 10/07/15 0330 10/08/15 0524  LIPASE 1609* 359*   No  results for input(s): AMMONIA in the last 168 hours. CBC:  Recent Labs Lab 10/07/15 0330 10/08/15 0524  WBC 9.5 17.1*  HGB 15.6 14.1  HCT 42.9 40.6  MCV 90.7 93.5  PLT 317 268   CBG:  Recent Labs Lab 10/07/15 2057 10/08/15 0011 10/08/15 0310 10/08/15 0746 10/08/15 1122  GLUCAP 130* 137* 120* 120* 139*     Radiological Exams on Admission: US Abdomen Complete  10/07/2015  CLINICAL DATA:  Obstructive jaundice. EXAM: ULTRASOUND ABDOMEN COMPLETE COMPARISON:  Abdomen CT dated 10/07/2015. FINDINGS: Gallbladder: Multiple gallstones in the gallbladder measuring up to 1.7 cm in diameter each. Diffuse gallbladder wall thickening with a maximum thickness of 9 mm. No pericholecystic fluid. The patient was not focally tender over the gallbladder. Common bile duct: Diameter: 9.2 mm Liver: No focal lesion identified. Within normal limits in parenchymal echogenicity. IVC: No abnormality visualized. Pancreas: Poorly visualized. Ill-defined decreased echogenicity in the pancreatic head, similar to the recent  CT. Spleen: Size and appearance within normal limits. Right Kidney: Length: 10.4 cm. Echogenicity within normal limits. No mass or hydronephrosis visualized. Left Kidney: Length: 12.4 cm. Echogenicity within normal limits. No mass or hydronephrosis visualized. Abdominal aorta: No aneurysm visualized. Other findings: None. IMPRESSION: 1. Findings compatible with acute pancreatitis involving the pancreatic head. 2. Cholelithiasis. 3. 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 4. Biliary ductal dilatation, possibly due to a nonvisualized distal common duct stone or stones. This could also be related to the acute pancreatitis. Electronically Signed   By: Claudie Revering M.D.   On: 10/07/2015 15:25   Ct Abdomen Pelvis W Contrast  10/07/2015  CLINICAL DATA:  Upper abdominal pain, vomiting and diarrhea after dinner last night. History of diabetes, hyperlipidemia, hypertension and prostate cancer. EXAM: CT ABDOMEN AND PELVIS WITH CONTRAST TECHNIQUE: Multidetector CT imaging of the abdomen and pelvis was performed using the standard protocol following bolus administration of intravenous contrast. CONTRAST:  36mL OMNIPAQUE IOHEXOL 300 MG/ML SOLN, 155mL OMNIPAQUE IOHEXOL 300 MG/ML SOLN COMPARISON:  CT abdomen and pelvis January 16, 2007 FINDINGS: LUNG BASES: Included view of the lung bases are clear. The heart is mildly enlarged, no pericardial effusions. Contrast in the distal esophagus associated with small hiatal hernia most consistent with reflux. SOLID ORGANS: At least 2 gallstones measure up to 17 mm with gallbladder wall thickening and pericholecystic inflammation. Patchy hypo enhancement of the pancreatic head, with extensive peripancreatic inflammation, no pseudocyst, pancreatic duct dilatation, pancreatic calcifications or mass. The liver, spleen, and adrenal glands are unremarkable. GASTROINTESTINAL TRACT: The stomach, small and large bowel are normal in course and caliber  without inflammatory changes. Wide necked 3 cm umbilical hernia containing a knuckle of small bowel without inflammatory changes or dilatation of the bowel. 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 Normal appendix. KIDNEYS/ URINARY TRACT: Kidneys are orthotopic, demonstrating symmetric enhancement. No nephrolithiasis, hydronephrosis or solid renal masses. The unopacified ureters are normal in course and caliber. Delayed imaging through the kidneys demonstrates symmetric prompt contrast excretion within the proximal urinary collecting system. Urinary bladder is partially distended and unremarkable. PERITONEUM/RETROPERITONEUM: Moderate amount of free fluid in the retroperitoneum, extending into the mesentery, tracking into the pelvis. Aortoiliac vessels are normal in course and caliber, moderate calcific atherosclerosis. No lymphadenopathy by CT size criteria. Mild prostatomegaly. SOFT TISSUE/OSSEOUS STRUCTURES: Non-suspicious. Small fat containing umbilical hernias. Bridging osteophytes the sacroiliac joints. Degenerative change of lumbar spine resulting in moderate to severe L5-S1 neural foraminal narrowing. IMPRESSION: Acute pancreatitis, hypo enhancing pancreatic head compatible  with focal necrotizing pancreatitis. Moderate amount of retroperitoneal free fluid, no abscess. Cholelithiasis with CT findings of cholecystitis likely reactive from acute pancreatitis. Wide necked 3 cm umbilical hernia containing small bowel without CT findings of incarceration or strangulation, no bowel obstruction . Electronically Signed   By: Elon Alas M.D.   On: 10/07/2015 05:05    Assessment/Plan Present on Admission:  . Pancreatitis . Obstructive jaundice . Elevated transaminase level . HTN (hypertension) . HLD (hyperlipidemia) . Gout . Prostate cancer ([place]) . Hypercalcemia . Hypokalemia . Acute pancreatitis  PLAN:  Gallstone pancreatitis:  Will d/c antibiotics.  This is OK with GI.   Will try clear liquid tomorrow if LFT normalized.  He will likely benefit getting CCY outpatient.    DM:  Stable.  Hypokalemia:  Supplemented.    Other plans as per orders.  Code Status: FULL CODE>   Orvan Falconer, MD. [facility] Pager [phone] 7pm to 7am.  10/08/2015, 3:54 PM

## 2015-10-08 NOTE — Evaluation (Signed)
Physical Therapy Evaluation Patient Details Name: Alan [name]. MRN: [id number] DOB: [birth year] Today's Date: 10/08/2015   History of Present Illness  Pt is a 74yo black male who came to [facility] after N/V diahrrea, and RUQ pain at home. Pt found to have acute pancreatitis. At eval, all symptoms are well managed.   Clinical Impression  Pt is received semirecumbent in bed upon entry, awake, alert, and willing to participate. No acute distress noted. Pt is A&Ox3 and pleasant. Pt reports zero falls in the last 6 months. Strength screened during functional mobility demosntrates moderate weakness, evident in 5x sit to stand time of 37 seconds. Pt falls risk is high as evidenced by slow gait speed, poor forward reach test, and LOB with moderate perturbation. VSS however pt found to have BP 197/81, at which point RN was notified who intervened with meds prior to completion of evaluation. Patient presenting with impairment of strength, balance, and activity tolerance, limiting ability to perform ADL and mobility tasks at  baseline level of function. Patient will benefit from skilled intervention to address the above impairments and limitations, in order to restore to prior level of function, improve patient safety upon discharge, and to decrease falls risk.      Follow Up Recommendations Home health PT    Equipment Recommendations  None recommended by PT    Recommendations for Other Services       Precautions / Restrictions Precautions Precautions: None Restrictions Weight Bearing Restrictions: No      Mobility  Bed Mobility Overal bed mobility: Modified Independent Bed Mobility: Supine to Sit;Sit to Supine     Supine to sit: Modified independent (Device/Increase time) Sit to supine: Modified independent (Device/Increase time)   General bed mobility comments: Additional time and effort required.   Transfers Overall transfer level: Needs assistance Equipment used: None Transfers: Sit  to/from Stand Sit to Stand: Supervision         General transfer comment: Requires mod to max effort to perform; 5x sit to stand in 37 seconds. Hypertensive after transfers, RN notifed.   Ambulation/Gait Ambulation/Gait assistance: Supervision Ambulation Distance (Feet): 75 Feet Assistive device: None   Gait velocity: 0.37ft/sec Gait velocity interpretation: <1.8 ft/sec, indicative of risk for recurrent falls General Gait Details: Heavy lateral sway, moreso to left while walking, no LOB, but very slow adn unsteady. Short step-to gait bilat. Repoorts that he feel woozy.   Stairs            Wheelchair Mobility    Modified Rankin (Stroke Patients Only)       Balance Overall balance assessment: Needs assistance Sitting-balance support: No upper extremity supported;Feet supported Sitting balance-Leahy Scale: Normal       Standing balance-Leahy Scale: Fair                 High Level Balance Comments: Forward reach is 10 inches, requires about 5 seconds to perform; Rhomberg, sharpened rhomberg negative. LOB with AP perturbation at sternum, all others stable.              Pertinent Vitals/Pain Pain Assessment: No/denies pain (residual soreness, reports most pain now controlled well with pain meds.)    Home Living Family/patient expects to be discharged to:: Private residence Living Arrangements: Spouse/significant other;Other relatives Available Help at Discharge: Family Type of Home: House Home Access: Level entry     Home Layout: One level Home Equipment: Walker - 2 wheels;Cane - single point      Prior Function Level of Independence: Independent  Comments: community ambulator, still drivies.      Hand Dominance   Dominant Hand: Right    Extremity/Trunk Assessment   Upper Extremity Assessment: Overall WFL for tasks assessed           Lower Extremity Assessment: Generalized weakness;Overall WFL for tasks assessed (5x Sit to  stand in 37 seconds. )      Cervical / Trunk Assessment: Normal  Communication   Communication: No difficulties  Cognition Arousal/Alertness: Awake/alert Behavior During Therapy: WFL for tasks assessed/performed Overall Cognitive Status: Within Functional Limits for tasks assessed                      General Comments      Exercises        Assessment/Plan    PT Assessment Patient needs continued PT services  PT Diagnosis Generalized weakness   PT Problem List Decreased strength;Decreased mobility;Decreased activity tolerance;Decreased balance;Decreased coordination  PT Treatment Interventions Therapeutic activities;Therapeutic exercise;Patient/family education   PT Goals (Current goals can be found in the Care Plan section) Acute Rehab PT Goals Patient Stated Goal: Return to home, and recovery strength/mobility.  PT Goal Formulation: With patient/family Time For Goal Achievement: 10/22/15 Potential to Achieve Goals: Good    Frequency Min 3X/week   Barriers to discharge        Co-evaluation               End of Session Equipment Utilized During Treatment: Gait belt Activity Tolerance: Patient tolerated treatment well (Intermittently dizziy with activity, resolves with rest) Patient left: in bed;with family/visitor present;with chair alarm set;with call bell/phone within reach Nurse Communication: Mobility status         Time: 1100-1131 PT Time Calculation (min) (ACUTE ONLY): 31 min   Charges:   PT Evaluation $Initial PT Evaluation Tier I: 1 Procedure     PT G Codes:        Buccola,Allan C 11/03/2015, 11:43 AM  11:45 AM  Etta Grandchild, PT, DPT [place] License # [id number]

## 2015-10-08 NOTE — Care Management Important Message (Signed)
Important Message  Patient Details  Name: Alan [name]. MRN: [id number] Date of Birth: 1941/05/25   Medicare Important Message Given:  Yes-second notification given    Sherald Barge, RN 10/08/2015, 12:42 PM

## 2015-10-08 NOTE — Care Management Note (Signed)
Case Management Note  Patient Details  Name: Alan [name]. MRN: [id number] Date of Birth: [birth year]  Subjective/Objective:                    Action/Plan: Pt made recommendation for [facility] PT, pt agreeable and has chosen [facility] for [facility] agency. Romualdo Bolk, of [facility], made aware and will obtain pt info from chart. Pt and wife aware HH has 48 hours to initiate services at [place]. If DC'd over weekend, RN will notify [facility] of [place]. No further CM needs.   Expected Discharge Date:   10/10/2015               Expected Discharge Plan:  [place]  In-House Referral:  NA  Discharge planning Services  CM Consult  Post Acute Care Choice:  Home Health Choice offered to:  Patient  DME Arranged:    DME Agency:     HH Arranged:  PT [place]:  [place]  Status of Service:  Completed, signed off  Medicare Important Message Given:  Yes-second notification given Date Medicare IM Given:    Medicare IM give by:    Date Additional Medicare IM Given:    Additional Medicare Important Message give by:     If discussed at [place] of Stay Meetings, dates discussed:    Additional Comments:  Sherald Barge, RN 10/08/2015, 12:49 PM

## 2015-10-09 DIAGNOSIS — K861 Other chronic pancreatitis: Secondary | ICD-10-CM

## 2015-10-09 DIAGNOSIS — K851 Biliary acute pancreatitis without necrosis or infection: Secondary | ICD-10-CM

## 2015-10-09 LAB — HEPATIC FUNCTION PANEL
ALBUMIN: 2.7 g/dL — AB (ref 3.5–5.0)
ALT: 167 U/L — ABNORMAL HIGH (ref 17–63)
AST: 56 U/L — ABNORMAL HIGH (ref 15–41)
Alkaline Phosphatase: 76 U/L (ref 38–126)
BILIRUBIN INDIRECT: 1.6 mg/dL — AB (ref 0.3–0.9)
BILIRUBIN TOTAL: 2.1 mg/dL — AB (ref 0.3–1.2)
Bilirubin, Direct: 0.5 mg/dL (ref 0.1–0.5)
TOTAL PROTEIN: 6.1 g/dL — AB (ref 6.5–8.1)

## 2015-10-09 LAB — GLUCOSE, CAPILLARY
GLUCOSE-CAPILLARY: 109 mg/dL — AB (ref 65–99)
GLUCOSE-CAPILLARY: 121 mg/dL — AB (ref 65–99)
GLUCOSE-CAPILLARY: 127 mg/dL — AB (ref 65–99)
GLUCOSE-CAPILLARY: 142 mg/dL — AB (ref 65–99)
Glucose-Capillary: 141 mg/dL — ABNORMAL HIGH (ref 65–99)
Glucose-Capillary: 120 mg/dL — ABNORMAL HIGH (ref 65–99)
Glucose-Capillary: 133 mg/dL — ABNORMAL HIGH (ref 65–99)

## 2015-10-09 LAB — LIPASE, BLOOD: Lipase: 77 U/L — ABNORMAL HIGH (ref 11–51)

## 2015-10-09 NOTE — Progress Notes (Signed)
Patient reports steady improvement in pain now 5/10. Advanced to a clear liquid diet.   Vital signs in last 24 hours: Temp:  [97.5 F (36.4 C)-99.5 F (37.5 C)] 98 F (36.7 C) (10/29 1021) Pulse Rate:  [72-98] 73 (10/29 1021) Resp:  [15-20] 17 (10/29 1021) BP: (180-196)/(67-81) 194/72 mmHg (10/29 1021) SpO2:  [93 %-97 %] 95 % (10/29 1021) Last BM Date: 10/08/15 General:   Alert,   pleasant and cooperative in NAD. Accompanied by family members. Abdomen:  Nondistended.  Normal bowel sounds, some persisting epigastric tenderness to palpation. Without guarding. without rebound.  No mass or organomegaly. Extremities:  Without clubbing or edema.    Intake/Output from previous day: 10/28 0701 - 10/29 0700 In: 240 [P.O.:240] Out: 1200 [Urine:1200] Intake/Output this shift:    Lab Results:  Recent Labs  10/07/15 0330 10/08/15 0524  WBC 9.5 17.1*  HGB 15.6 14.1  HCT 42.9 40.6  PLT 317 268   BMET  Recent Labs  10/07/15 0330 10/07/15 1540 10/08/15 0524  NA 140 141 142  K 3.2* 3.7 3.4*  CL 98* 106 108  CO2 25 25 26   GLUCOSE 223* 198* 144*  BUN 18 16 14   CREATININE 1.36* 1.13 1.04  CALCIUM 10.5* 9.2 8.6*   LFT  Recent Labs  10/09/15 0552  PROT 6.1*  ALBUMIN 2.7*  AST 56*  ALT 167*  ALKPHOS 76  BILITOT 2.1*  BILIDIR 0.5  IBILI 1.6*   PT/INR No results for input(s): LABPROT, INR in the last 72 hours. Hepatitis Panel No results for input(s): HEPBSAG, HCVAB, HEPAIGM, HEPBIGM in the last 72 hours. C-Diff No results for input(s): CDIFFTOX in the last 72 hours.  Studies/Results: US Abdomen Complete  10/07/2015  CLINICAL DATA:  Obstructive jaundice. EXAM: ULTRASOUND ABDOMEN COMPLETE COMPARISON:  Abdomen CT dated 10/07/2015. FINDINGS: Gallbladder: Multiple gallstones in the gallbladder measuring up to 1.7 cm in diameter each. Diffuse gallbladder wall thickening with a maximum thickness of 9 mm. No pericholecystic fluid. The patient was not focally tender over the  gallbladder. Common bile duct: Diameter: 9.2 mm Liver: No focal lesion identified. Within normal limits in parenchymal echogenicity. IVC: No abnormality visualized. Pancreas: Poorly visualized. Ill-defined decreased echogenicity in the pancreatic head, similar to the recent CT. Spleen: Size and appearance within normal limits. Right Kidney: Length: 10.4 cm. Echogenicity within normal limits. No mass or hydronephrosis visualized. Left Kidney: Length: 12.4 cm. Echogenicity within normal limits. No mass or hydronephrosis visualized. Abdominal aorta: No aneurysm visualized. Other findings: None. IMPRESSION: 1. Findings compatible with acute pancreatitis involving the pancreatic head. 2. Cholelithiasis. 3. 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 4. Biliary ductal dilatation, possibly due to a nonvisualized distal common duct stone or stones. This could also be related to the acute pancreatitis. Electronically Signed   By: Claudie Revering M.D.   On: 10/07/2015 15:25    Assessment: Active Problems:   HTN (hypertension)   HLD (hyperlipidemia)   Diabetes ([facility])   Gout   Prostate cancer ([facility])   Pancreatitis   Obstructive jaundice   Elevated transaminase level   Hypercalcemia   Hypokalemia   Acute pancreatitis   Elevated liver function tests   Elevated bilirubin  Impression:    Pancreatitis-likely biliary in origin improving. Rapid fall in LFTs highly suggestive of a recent transient obstruction related to a gallstone.  Recommendations:  Depending on how he does with clear liquids, may be able to advanced diet further tomorrow. Surgical consultation soon for cholecystectomy in the  next few weeks.   LOS: 2 days   Manus Rudd  10/09/2015, 11:51 AM

## 2015-10-09 NOTE — Progress Notes (Signed)
[facility] PROGRESS NOTE  [name] HOZ:[id number] DOB: 10-28-1941    PCP:   Wardell Honour, MD   HPI:   Patient is a 74 yo male with hx of DM, HTN, prostate cancer, HLD, Gout, admitted by Dr Arnoldo Morale for obstructive cholelithiasis, pancreatitis, and cholecystitis. He has been NPO, and GI has been consulted. He likely has passed his stone, as his LFTs has improved markedly and so was his Lipase. GI doesn't think we need ERCP nor MRCP.  Abdominal pain has improved.   Rewiew of Systems:  Constitutional: Negative for malaise, fever and chills. No significant weight loss or weight gain Eyes: Negative for eye pain, redness and discharge, diplopia, visual changes, or flashes of light. ENMT: Negative for ear pain, hoarseness, nasal congestion, sinus pressure and sore throat. No headaches; tinnitus, drooling, or problem swallowing. Cardiovascular: Negative for chest pain, palpitations, diaphoresis, dyspnea and peripheral edema. ; No orthopnea, PND Respiratory: Negative for cough, hemoptysis, wheezing and stridor. No pleuritic chestpain. Gastrointestinal: Negative for nausea, vomiting, diarrhea, constipation, abdominal pain, melena, blood in stool, hematemesis, jaundice and rectal bleeding.    Genitourinary: Negative for frequency, dysuria, incontinence,flank pain and hematuria; Musculoskeletal: Negative for back pain and neck pain. Negative for swelling and trauma.;  Skin: . Negative for pruritus, rash, abrasions, bruising and skin lesion.; ulcerations Neuro: Negative for headache, lightheadedness and neck stiffness. Negative for weakness, altered level of consciousness , altered mental status, extremity weakness, burning feet, involuntary movement, seizure and syncope.  Psych: negative for anxiety, depression, insomnia, tearfulness, panic attacks, hallucinations, paranoia, suicidal or homicidal ideation    Past Medical History  Diagnosis Date  . Hypertension   . Heart murmur   .  ED (erectile dysfunction)   . Agranulocytosis ([place])   . Diabetes mellitus   . Hyperlipidemia   . Gout   . Adenomatous colon polyp   . Prostate cancer ([place])     Followed by Dr. Jeffie Pollock     Past Surgical History  Procedure Laterality Date  . Colonoscopy  2012    [name]: moderate sigmoid diverticulosis, internal hemorrhoids. next tcs 2017  . Sigmoidoscopy      Medications:  HOME MEDS: Prior to Admission medications   Medication Sig Start Date End Date Taking? Authorizing Provider  amLODipine (NORVASC) 10 MG tablet Take 1 tablet (10 mg total) by mouth every evening. 09/08/15  Yes Wardell Honour, MD  aspirin 81 MG tablet Take 81 mg by mouth every evening.    Yes Historical Provider, MD  benazepril (LOTENSIN) 40 MG tablet TAKE ONE TABLET BY MOUTH ONCE DAILY 09/08/15  Yes Wardell Honour, MD  febuxostat (ULORIC) 40 MG tablet Take 2 tablets (80 mg total) by mouth daily. 09/08/15  Yes Wardell Honour, MD  finasteride (PROSCAR) 5 MG tablet Take 1 tablet (5 mg total) by mouth daily. 05/11/15  Yes Wardell Honour, MD  furosemide (LASIX) 40 MG tablet Take one and half po qd 09/08/15  Yes Wardell Honour, MD  metFORMIN (GLUCOPHAGE) 500 MG tablet Take 1 tablet (500 mg total) by mouth 2 (two) times daily with a meal. 03/08/15  Yes Wardell Honour, MD  metoprolol succinate (TOPROL-XL) 100 MG 24 hr tablet Take 1 tablet (100 mg total) by mouth 1 day or 1 dose. 09/08/15  Yes Wardell Honour, MD  Multiple Vitamins-Minerals (MULTIVITAMIN PO) Take 1 tablet by mouth daily.   Yes Historical Provider, MD  niacin (NIASPAN) 1000 MG CR tablet Take 1 tablet (1,000 mg  total) by mouth at bedtime. 09/08/15  Yes Wardell Honour, MD  pravastatin (PRAVACHOL) 40 MG tablet Take 1 tablet (40 mg total) by mouth every evening. 09/08/15  Yes Wardell Honour, MD     Allergies:  Allergies  Allergen Reactions  . Allopurinol Other (See Comments)    Significantly decreased WBC's  . Colchicine Other (See Comments)     Significantly decreased WBC's    Social History:   reports that he quit smoking about 28 years ago. His smoking use included Cigarettes. He has never used smokeless tobacco. He reports that he does not drink alcohol or use illicit drugs.  Family History: Family History  Problem Relation Age of Onset  . Stroke Mother   . Hypertension Mother   . Alzheimer's disease Mother   . Hypertension Father   . Cancer Sister     brain  . Cancer Sister     breast  . Colon cancer Neg Hx   . Pancreatic disease Neg Hx      Physical Exam: Filed Vitals:   10/08/15 2026 10/08/15 2359 10/09/15 0354 10/09/15 1021  BP: 196/76 183/71 188/73 194/72  Pulse: 84 87 72 73  Temp: 97.8 F (36.6 C) 97.5 F (36.4 C) 99.5 F (37.5 C) 98 F (36.7 C)  TempSrc: Oral Oral Oral Oral  Resp: 20 15 17 17   Height:      Weight:      SpO2: 97% 93% 94% 95%   Blood pressure 194/72, pulse 73, temperature 98 F (36.7 C), temperature source Oral, resp. rate 17, height 5\' 5"  (1.651 m), weight 75.8 kg (167 lb 1.7 oz), SpO2 95 %.  GEN:  Pleasant  patient lying in the stretcher in no acute distress; cooperative with exam. PSYCH:  alert and oriented x4; does not appear anxious or depressed; affect is appropriate. HEENT: Mucous membranes pink and anicteric; PERRLA; EOM intact; no cervical lymphadenopathy nor thyromegaly or carotid bruit; no JVD; There were no stridor. Neck is very supple. Breasts:: Not examined CHEST WALL: No tenderness CHEST: Normal respiration, clear to auscultation bilaterally.  HEART: Regular rate and rhythm.  There are no murmur, rub, or gallops.   BACK: No kyphosis or scoliosis; no CVA tenderness ABDOMEN: soft and non-tender; no masses, no organomegaly, normal abdominal bowel sounds; no pannus; no intertriginous candida. There is no rebound and no distention. Rectal Exam: Not done EXTREMITIES: No bone or joint deformity; age-appropriate arthropathy of the hands and knees; no edema; no ulcerations.   There is no calf tenderness. Genitalia: not examined PULSES: 2+ and symmetric SKIN: Normal hydration no rash or ulceration CNS: Cranial nerves 2-12 grossly intact no focal lateralizing neurologic deficit.  Speech is fluent; uvula elevated with phonation, facial symmetry and tongue midline. DTR are normal bilaterally, cerebella exam is intact, barbinski is negative and strengths are equaled bilaterally.  No sensory loss.   Labs on Admission:  Basic Metabolic Panel:  Recent Labs Lab 10/07/15 0330 10/07/15 1540 10/08/15 0524  NA 140 141 142  K 3.2* 3.7 3.4*  CL 98* 106 108  CO2 25 25 26   GLUCOSE 223* 198* 144*  BUN 18 16 14   CREATININE 1.36* 1.13 1.04  CALCIUM 10.5* 9.2 8.6*  MG 1.5*  --   --    Liver Function Tests:  Recent Labs Lab 10/07/15 0330 10/08/15 0524 10/09/15 0552  AST 1093* 158* 56*  ALT 557* 296* 167*  ALKPHOS 134* 95 76  BILITOT 3.3* 2.6* 2.1*  PROT 8.2* 6.2* 6.1*  ALBUMIN 4.4 3.0* 2.7*    Recent Labs Lab 10/07/15 0330 10/08/15 0524 10/09/15 0552  LIPASE 1609* 359* 77*   CBC:  Recent Labs Lab 10/07/15 0330 10/08/15 0524  WBC 9.5 17.1*  HGB 15.6 14.1  HCT 42.9 40.6  MCV 90.7 93.5  PLT 317 268   CBG:  Recent Labs Lab 10/08/15 1612 10/08/15 2023 10/08/15 2358 10/09/15 0353 10/09/15 0729  GLUCAP 127* 141* 142* 121* 127*     Radiological Exams on Admission: US Abdomen Complete  10/07/2015  CLINICAL DATA:  Obstructive jaundice. EXAM: ULTRASOUND ABDOMEN COMPLETE COMPARISON:  Abdomen CT dated 10/07/2015. FINDINGS: Gallbladder: Multiple gallstones in the gallbladder measuring up to 1.7 cm in diameter each. Diffuse gallbladder wall thickening with a maximum thickness of 9 mm. No pericholecystic fluid. The patient was not focally tender over the gallbladder. Common bile duct: Diameter: 9.2 mm Liver: No focal lesion identified. Within normal limits in parenchymal echogenicity. IVC: No abnormality visualized. Pancreas: Poorly visualized.  Ill-defined decreased echogenicity in the pancreatic head, similar to the recent CT. Spleen: Size and appearance within normal limits. Right Kidney: Length: 10.4 cm. Echogenicity within normal limits. No mass or hydronephrosis visualized. Left Kidney: Length: 12.4 cm. Echogenicity within normal limits. No mass or hydronephrosis visualized. Abdominal aorta: No aneurysm visualized. Other findings: None. IMPRESSION: 1. Findings compatible with acute pancreatitis involving the pancreatic head. 2. Cholelithiasis. 3. 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 4. Biliary ductal dilatation, possibly due to a nonvisualized distal common duct stone or stones. This could also be related to the acute pancreatitis. Electronically Signed   By: Claudie Revering M.D.   On: 10/07/2015 15:25   Assessment/Plan Present on Admission:  . Pancreatitis . Obstructive jaundice . Elevated transaminase level . HTN (hypertension) . HLD (hyperlipidemia) . Gout . Prostate cancer ([place]) . Hypercalcemia . Hypokalemia . Acute pancreatitis  PLAN:  Gallstone pancreatitis: Off antibiotics. This is OK with GI. LFTs came down nicely, and so was lipase.  Will start clear liquid and advance as tolerated.   He will likely benefit getting CCY outpatient.   DM: Stable.  Hypokalemia: Supplemented.    Other plans as per orders.  Code Status: FULL Haskel Khan, MD. [facility] Pager [phone] 7pm to 7am.  10/09/2015, 10:54 AM

## 2015-10-10 LAB — GLUCOSE, CAPILLARY
Glucose-Capillary: 112 mg/dL — ABNORMAL HIGH (ref 65–99)
Glucose-Capillary: 136 mg/dL — ABNORMAL HIGH (ref 65–99)
Glucose-Capillary: 103 mg/dL — ABNORMAL HIGH (ref 65–99)
Glucose-Capillary: 130 mg/dL — ABNORMAL HIGH (ref 65–99)
Glucose-Capillary: 137 mg/dL — ABNORMAL HIGH (ref 65–99)
Glucose-Capillary: 140 mg/dL — ABNORMAL HIGH (ref 65–99)

## 2015-10-10 LAB — HEPATIC FUNCTION PANEL
ALBUMIN: 2.5 g/dL — AB (ref 3.5–5.0)
ALK PHOS: 76 U/L (ref 38–126)
ALT: 112 U/L — AB (ref 17–63)
AST: 32 U/L (ref 15–41)
BILIRUBIN INDIRECT: 1.5 mg/dL — AB (ref 0.3–0.9)
Bilirubin, Direct: 0.4 mg/dL (ref 0.1–0.5)
TOTAL PROTEIN: 6.2 g/dL — AB (ref 6.5–8.1)
Total Bilirubin: 1.9 mg/dL — ABNORMAL HIGH (ref 0.3–1.2)

## 2015-10-10 LAB — CBC WITH DIFFERENTIAL/PLATELET
BASOS ABS: 0 10*3/uL (ref 0.0–0.1)
Basophils Relative: 0 %
EOS PCT: 0 %
Eosinophils Absolute: 0 10*3/uL (ref 0.0–0.7)
HCT: 35.2 % — ABNORMAL LOW (ref 39.0–52.0)
HEMOGLOBIN: 12.3 g/dL — AB (ref 13.0–17.0)
LYMPHS PCT: 6 %
Lymphs Abs: 0.8 10*3/uL (ref 0.7–4.0)
MCH: 31.9 pg (ref 26.0–34.0)
MCHC: 34.9 g/dL (ref 30.0–36.0)
MCV: 91.4 fL (ref 78.0–100.0)
Monocytes Absolute: 1 10*3/uL (ref 0.1–1.0)
Monocytes Relative: 8 %
NEUTROS ABS: 10.2 10*3/uL — AB (ref 1.7–7.7)
NEUTROS PCT: 86 %
PLATELETS: 206 10*3/uL (ref 150–400)
RBC: 3.85 MIL/uL — AB (ref 4.22–5.81)
RDW: 14 % (ref 11.5–15.5)
WBC: 12 10*3/uL — AB (ref 4.0–10.5)

## 2015-10-10 LAB — BASIC METABOLIC PANEL
ANION GAP: 6 (ref 5–15)
BUN: 14 mg/dL (ref 6–20)
CO2: 22 mmol/L (ref 22–32)
Calcium: 8.4 mg/dL — ABNORMAL LOW (ref 8.9–10.3)
Chloride: 108 mmol/L (ref 101–111)
Creatinine, Ser: 0.72 mg/dL (ref 0.61–1.24)
Glucose, Bld: 147 mg/dL — ABNORMAL HIGH (ref 65–99)
POTASSIUM: 3.4 mmol/L — AB (ref 3.5–5.1)
SODIUM: 136 mmol/L (ref 135–145)

## 2015-10-10 LAB — LIPASE, BLOOD: LIPASE: 34 U/L (ref 11–51)

## 2015-10-10 NOTE — Plan of Care (Signed)
Problem: Phase I Progression Outcomes Goal: Pain controlled with appropriate interventions Outcome: Progressing Pt still requires pain medicine every 3 hours. He complains of pain in his abdomen.  Goal: OOB as tolerated unless otherwise ordered Outcome: Progressing Pt gets OOB to toliet. Pt has been using urinal and BSC.  Goal: Hemodynamically stable Outcome: Not Progressing Pt's blood pressures have still remained out of range and has required PRN blood pressure medicine.

## 2015-10-10 NOTE — Progress Notes (Signed)
[facility] PROGRESS NOTE  [name] LFY:[id number] DOB: 10-Jul-1941    PCP:   Wardell Honour, MD   HPI:  Patient is a 74 yo male with hx of DM, HTN, prostate cancer, HLD, Gout, admitted by Dr Arnoldo Morale for obstructive cholelithiasis, pancreatitis, and cholecystitis. He has been NPO, and GI has been consulted. He likely has passed his stone, as his LFTs has improved markedly and so was his Lipase. GI doesn't think we need ERCP nor MRCP.  Abdominal pain has improved. He was able to tolerate clear liquid, but had more abdominal pain with advancing food.   Rewiew of Systems:  Constitutional: Negative for malaise, fever and chills. No significant weight loss or weight gain Eyes: Negative for eye pain, redness and discharge, diplopia, visual changes, or flashes of light. ENMT: Negative for ear pain, hoarseness, nasal congestion, sinus pressure and sore throat. No headaches; tinnitus, drooling, or problem swallowing. Cardiovascular: Negative for chest pain, palpitations, diaphoresis, dyspnea and peripheral edema. ; No orthopnea, PND Respiratory: Negative for cough, hemoptysis, wheezing and stridor. No pleuritic chestpain. Gastrointestinal: Negative for nausea, vomiting, diarrhea, constipation, melena, blood in stool, hematemesis, jaundice and rectal bleeding.    Genitourinary: Negative for frequency, dysuria, incontinence,flank pain and hematuria; Musculoskeletal: Negative for back pain and neck pain. Negative for swelling and trauma.;  Skin: . Negative for pruritus, rash, abrasions, bruising and skin lesion.; ulcerations Neuro: Negative for headache, lightheadedness and neck stiffness. Negative for weakness, altered level of consciousness , altered mental status, extremity weakness, burning feet, involuntary movement, seizure and syncope.  Psych: negative for anxiety, depression, insomnia, tearfulness, panic attacks, hallucinations, paranoia, suicidal or homicidal ideation    Past  Medical History  Diagnosis Date  . Hypertension   . Heart murmur   . ED (erectile dysfunction)   . Agranulocytosis ([place])   . Diabetes mellitus   . Hyperlipidemia   . Gout   . Adenomatous colon polyp   . Prostate cancer ([place])     Followed by Dr. Jeffie Pollock     Past Surgical History  Procedure Laterality Date  . Colonoscopy  2012    [name]: moderate sigmoid diverticulosis, internal hemorrhoids. next tcs 2017  . Sigmoidoscopy      Medications:  HOME MEDS: Prior to Admission medications   Medication Sig Start Date End Date Taking? Authorizing Provider  amLODipine (NORVASC) 10 MG tablet Take 1 tablet (10 mg total) by mouth every evening. 09/08/15  Yes Wardell Honour, MD  aspirin 81 MG tablet Take 81 mg by mouth every evening.    Yes Historical Provider, MD  benazepril (LOTENSIN) 40 MG tablet TAKE ONE TABLET BY MOUTH ONCE DAILY 09/08/15  Yes Wardell Honour, MD  febuxostat (ULORIC) 40 MG tablet Take 2 tablets (80 mg total) by mouth daily. 09/08/15  Yes Wardell Honour, MD  finasteride (PROSCAR) 5 MG tablet Take 1 tablet (5 mg total) by mouth daily. 05/11/15  Yes Wardell Honour, MD  furosemide (LASIX) 40 MG tablet Take one and half po qd 09/08/15  Yes Wardell Honour, MD  metFORMIN (GLUCOPHAGE) 500 MG tablet Take 1 tablet (500 mg total) by mouth 2 (two) times daily with a meal. 03/08/15  Yes Wardell Honour, MD  metoprolol succinate (TOPROL-XL) 100 MG 24 hr tablet Take 1 tablet (100 mg total) by mouth 1 day or 1 dose. 09/08/15  Yes Wardell Honour, MD  Multiple Vitamins-Minerals (MULTIVITAMIN PO) Take 1 tablet by mouth daily.   Yes Historical Provider, MD  niacin (NIASPAN) 1000 MG CR tablet Take 1 tablet (1,000 mg total) by mouth at bedtime. 09/08/15  Yes Wardell Honour, MD  pravastatin (PRAVACHOL) 40 MG tablet Take 1 tablet (40 mg total) by mouth every evening. 09/08/15  Yes Wardell Honour, MD     Allergies:  Allergies  Allergen Reactions  . Allopurinol Other (See Comments)     Significantly decreased WBC's  . Colchicine Other (See Comments)    Significantly decreased WBC's    Social History:   reports that he quit smoking about 28 years ago. His smoking use included Cigarettes. He has never used smokeless tobacco. He reports that he does not drink alcohol or use illicit drugs.  Family History: Family History  Problem Relation Age of Onset  . Stroke Mother   . Hypertension Mother   . Alzheimer's disease Mother   . Hypertension Father   . Cancer Sister     brain  . Cancer Sister     breast  . Colon cancer Neg Hx   . Pancreatic disease Neg Hx      Physical Exam: Filed Vitals:   10/10/15 0635 10/10/15 0640 10/10/15 0900 10/10/15 1038  BP: 193/68 188/66  195/77  Pulse:    77  Temp:   98.6 F (37 C) 98.6 F (37 C)  TempSrc:    Oral  Resp:    18  Height:      Weight:      SpO2:    95%   Blood pressure 195/77, pulse 77, temperature 98.6 F (37 C), temperature source Oral, resp. rate 18, height 5\' 5"  (1.651 m), weight 75.8 kg (167 lb 1.7 oz), SpO2 95 %.  GEN:  Pleasant  patient lying in the stretcher in no acute distress; cooperative with exam. PSYCH:  alert and oriented x4; does not appear anxious or depressed; affect is appropriate. HEENT: Mucous membranes pink and anicteric; PERRLA; EOM intact; no cervical lymphadenopathy nor thyromegaly or carotid bruit; no JVD; There were no stridor. Neck is very supple. Breasts:: Not examined CHEST WALL: No tenderness CHEST: Normal respiration, clear to auscultation bilaterally.  HEART: Regular rate and rhythm.  There are no murmur, rub, or gallops.   BACK: No kyphosis or scoliosis; no CVA tenderness ABDOMEN: soft and non-tender; no masses, no organomegaly, normal abdominal bowel sounds; no pannus; no intertriginous candida. There is no rebound and no distention. Rectal Exam: Not done EXTREMITIES: No bone or joint deformity; age-appropriate arthropathy of the hands and knees; no edema; no ulcerations.   There is no calf tenderness. Genitalia: not examined PULSES: 2+ and symmetric SKIN: Normal hydration no rash or ulceration CNS: Cranial nerves 2-12 grossly intact no focal lateralizing neurologic deficit.  Speech is fluent; uvula elevated with phonation, facial symmetry and tongue midline. DTR are normal bilaterally, cerebella exam is intact, barbinski is negative and strengths are equaled bilaterally.  No sensory loss.   Labs on Admission:  Basic Metabolic Panel:  Recent Labs Lab 10/07/15 0330 10/07/15 1540 10/08/15 0524  NA 140 141 142  K 3.2* 3.7 3.4*  CL 98* 106 108  CO2 25 25 26   GLUCOSE 223* 198* 144*  BUN 18 16 14   CREATININE 1.36* 1.13 1.04  CALCIUM 10.5* 9.2 8.6*  MG 1.5*  --   --    Liver Function Tests:  Recent Labs Lab 10/07/15 0330 10/08/15 0524 10/09/15 0552 10/10/15 0552  AST 1093* 158* 56* 32  ALT 557* 296* 167* 112*  ALKPHOS 134* 95 76 76  BILITOT 3.3* 2.6* 2.1* 1.9*  PROT 8.2* 6.2* 6.1* 6.2*  ALBUMIN 4.4 3.0* 2.7* 2.5*    Recent Labs Lab 10/07/15 0330 10/08/15 0524 10/09/15 0552 10/10/15 0552  LIPASE 1609* 359* 77* 34   No results for input(s): AMMONIA in the last 168 hours. CBC:  Recent Labs Lab 10/07/15 0330 10/08/15 0524 10/10/15 1321  WBC 9.5 17.1* 12.0*  NEUTROABS  --   --  10.2*  HGB 15.6 14.1 12.3*  HCT 42.9 40.6 35.2*  MCV 90.7 93.5 91.4  PLT 317 268 206    CBG:  Recent Labs Lab 10/09/15 1948 10/10/15 0003 10/10/15 0411 10/10/15 0722 10/10/15 1122  GLUCAP 133* 112* 136* 103* 130*   Assessment/Plan Present on Admission:  . Pancreatitis . Obstructive jaundice . Elevated transaminase level . HTN (hypertension) . HLD (hyperlipidemia) . Gout . Prostate cancer ([place]) . Hypercalcemia . Hypokalemia . Acute pancreatitis  PLAN:  Gallstone pancreatitis: Off antibiotics. This is OK with GI. LFTs came down nicely, and so was lipase. Unable to advance diet due to more abdominal pain, will place back on NPO per GI  recommendation.  DM: Stable.  Hypokalemia: Supplemented.   Other plans as per orders.  Code Status: FULL Haskel Khan, MD. [facility] Pager [phone] 7pm to 7am.  10/10/2015, 2:33 PM

## 2015-10-10 NOTE — Progress Notes (Addendum)
Patient had 4 out 10 pain earlier today but after eating lunch pain escalated to 9/10-requiring additional IV pain meds.   Vital signs in last 24 hours: Temp:  [97.5 F (36.4 C)-99.2 F (37.3 C)] 98.6 F (37 C) (10/30 1038) Pulse Rate:  [59-79] 77 (10/30 1038) Resp:  [15-18] 18 (10/30 1038) BP: (185-217)/(66-82) 195/77 mmHg (10/30 1038) SpO2:  [95 %-96 %] 95 % (10/30 1038) Last BM Date: 10/10/15 General:   Alert. Appears not to feel well. pleasant and cooperative in NAD. Accompanied multiple family members. Abdomen:  Nondistended. Positive bowel sounds.  Epigastric tenderness to palpation. No appreciable mass. Extremities:  Without clubbing or edema.    Intake/Output from previous day: 10/29 0701 - 10/30 0700 In: 3065 [P.O.:400; I.V.:2665] Out: 1275 [PVXYI:0165] Intake/Output this shift: Total I/O In: -  Out: 200 [Urine:200]  Lab Results:  Recent Labs  10/08/15 0524  WBC 17.1*  HGB 14.1  HCT 40.6  PLT 268   BMET  Recent Labs  10/07/15 1540 10/08/15 0524  NA 141 142  K 3.7 3.4*  CL 106 108  CO2 25 26  GLUCOSE 198* 144*  BUN 16 14  CREATININE 1.13 1.04  CALCIUM 9.2 8.6*   LFT  Recent Labs  10/10/15 0552  PROT 6.2*  ALBUMIN 2.5*  AST 32  ALT 112*  ALKPHOS 76  BILITOT 1.9*  BILIDIR 0.4  IBILI 1.5*   PT/INR No results for input(s): LABPROT, INR in the last 72 hours. Hepatitis Panel No results for input(s): HEPBSAG, HCVAB, HEPAIGM, HEPBIGM in the last 72 hours. C-Diff No results for input(s): CDIFFTOX in the last 72 hours.  Studies/Results: No results found.  Assessment: Active Problems:   HTN (hypertension)   HLD (hyperlipidemia)   Diabetes ([facility])   Gout   Prostate cancer ([place])   Pancreatitis   Obstructive jaundice   Elevated transaminase level   Hypercalcemia   Hypokalemia   Acute pancreatitis   Elevated liver function tests   Elevated bilirubin  LOS: 3 days   Impression:  Worsening abdominal pain with dietary advancement. LFTs  normalizing nicely. No fever. No tachycardia  Recommendations:  Back off diet nothing by mouth except ice chips today. Update labs including CBC and a basic metabolic profile.   May need repeat cross-sectional imaging but would hold for now.  Will reassess over the next 24 hours.  Manus Rudd  10/10/2015, 12:50 PM

## 2015-10-11 ENCOUNTER — Inpatient Hospital Stay (HOSPITAL_COMMUNITY): Payer: Medicare Other

## 2015-10-11 DIAGNOSIS — Z87898 Personal history of other specified conditions: Secondary | ICD-10-CM

## 2015-10-11 DIAGNOSIS — R109 Unspecified abdominal pain: Secondary | ICD-10-CM | POA: Insufficient documentation

## 2015-10-11 DIAGNOSIS — R1010 Upper abdominal pain, unspecified: Secondary | ICD-10-CM

## 2015-10-11 LAB — BASIC METABOLIC PANEL
ANION GAP: 6 (ref 5–15)
BUN: 15 mg/dL (ref 6–20)
CHLORIDE: 110 mmol/L (ref 101–111)
CO2: 23 mmol/L (ref 22–32)
CREATININE: 0.82 mg/dL (ref 0.61–1.24)
Calcium: 8.3 mg/dL — ABNORMAL LOW (ref 8.9–10.3)
GFR calc non Af Amer: >60 mL/min (ref >60)
Glucose, Bld: 141 mg/dL — ABNORMAL HIGH (ref 65–99)
POTASSIUM: 3.2 mmol/L — AB (ref 3.5–5.1)
Sodium: 139 mmol/L (ref 135–145)

## 2015-10-11 LAB — HEPATIC FUNCTION PANEL
ALBUMIN: 2.4 g/dL — AB (ref 3.5–5.0)
ALK PHOS: 97 U/L (ref 38–126)
ALT: 103 U/L — AB (ref 17–63)
AST: 59 U/L — AB (ref 15–41)
BILIRUBIN DIRECT: 1.1 mg/dL — AB (ref 0.1–0.5)
Indirect Bilirubin: 2.1 mg/dL — ABNORMAL HIGH (ref 0.3–0.9)
Total Bilirubin: 3.2 mg/dL — ABNORMAL HIGH (ref 0.3–1.2)
Total Protein: 6 g/dL — ABNORMAL LOW (ref 6.5–8.1)

## 2015-10-11 LAB — CBC
HEMATOCRIT: 34.8 % — AB (ref 39.0–52.0)
HEMOGLOBIN: 12.1 g/dL — AB (ref 13.0–17.0)
MCH: 31.8 pg (ref 26.0–34.0)
MCHC: 34.8 g/dL (ref 30.0–36.0)
MCV: 91.3 fL (ref 78.0–100.0)
Platelets: 210 10*3/uL (ref 150–400)
RBC: 3.81 MIL/uL — ABNORMAL LOW (ref 4.22–5.81)
RDW: 14 % (ref 11.5–15.5)
WBC: 10.8 10*3/uL — AB (ref 4.0–10.5)

## 2015-10-11 LAB — GLUCOSE, CAPILLARY
GLUCOSE-CAPILLARY: 118 mg/dL — AB (ref 65–99)
GLUCOSE-CAPILLARY: 129 mg/dL — AB (ref 65–99)
GLUCOSE-CAPILLARY: 146 mg/dL — AB (ref 65–99)
Glucose-Capillary: 138 mg/dL — ABNORMAL HIGH (ref 65–99)
Glucose-Capillary: 108 mg/dL — ABNORMAL HIGH (ref 65–99)
Glucose-Capillary: 118 mg/dL — ABNORMAL HIGH (ref 65–99)

## 2015-10-11 LAB — LIPASE, BLOOD: LIPASE: 39 U/L (ref 11–51)

## 2015-10-11 MED ORDER — METOPROLOL TARTRATE 50 MG PO TABS
50.0000 mg | ORAL_TABLET | Freq: Two times a day (BID) | ORAL | Status: DC
  Administered 2015-10-11 – 2015-10-19 (×15): 50 mg via ORAL
  Filled 2015-10-11 (×15): qty 1

## 2015-10-11 MED ORDER — BENAZEPRIL HCL 10 MG PO TABS
40.0000 mg | ORAL_TABLET | Freq: Every day | ORAL | Status: DC
  Administered 2015-10-11 – 2015-10-19 (×8): 40 mg via ORAL
  Filled 2015-10-11 (×8): qty 4

## 2015-10-11 MED ORDER — AMLODIPINE BESYLATE 5 MG PO TABS
10.0000 mg | ORAL_TABLET | Freq: Every day | ORAL | Status: DC
  Administered 2015-10-11 – 2015-10-19 (×8): 10 mg via ORAL
  Filled 2015-10-11 (×8): qty 2

## 2015-10-11 MED ORDER — SODIUM CHLORIDE 0.9 % IJ SOLN
INTRAMUSCULAR | Status: AC
  Administered 2015-10-11: 10 mL
  Filled 2015-10-11: qty 60

## 2015-10-11 MED ORDER — HYDRALAZINE HCL 20 MG/ML IJ SOLN
10.0000 mg | INTRAMUSCULAR | Status: DC | PRN
  Administered 2015-10-11 – 2015-10-13 (×4): 10 mg via INTRAVENOUS
  Filled 2015-10-11 (×3): qty 1

## 2015-10-11 MED ORDER — IOHEXOL 300 MG/ML  SOLN
100.0000 mL | Freq: Once | INTRAMUSCULAR | Status: AC | PRN
Start: 2015-10-11 — End: 2015-10-11
  Administered 2015-10-11: 100 mL via INTRAVENOUS

## 2015-10-11 NOTE — Progress Notes (Signed)
Pt blood pressure was rechecked and was 204/76. MD notified and orders were placed. RN will continue to recheck BP and monitor patient. Oswald Hillock, RN

## 2015-10-11 NOTE — Progress Notes (Addendum)
[facility] PROGRESS NOTE  [name] ONG:[id number] DOB: 05-20-1941    PCP:   Wardell Honour, MD   HPI:  Patient is a 74 yo male with hx of DM, HTN, prostate cancer, HLD, Gout, admitted by Dr Arnoldo Morale for obstructive cholelithiasis, pancreatitis, and cholecystitis. He has been NPO, and GI has been consulted. He likely has passed his stone, as his LFTs has improved markedly and so was his Lipase. GI doesn't think we need ERCP nor MRCP.  Abdominal pain has improved. He is still not able to tolerate any diet and has been NPO.  BP was noted to be up.  His home meds have been on hold.   Rewiew of Systems:  Constitutional: Negative for malaise, fever and chills. No significant weight loss or weight gain Eyes: Negative for eye pain, redness and discharge, diplopia, visual changes, or flashes of light. ENMT: Negative for ear pain, hoarseness, nasal congestion, sinus pressure and sore throat. No headaches; tinnitus, drooling, or problem swallowing. Cardiovascular: Negative for chest pain, palpitations, diaphoresis, dyspnea and peripheral edema. ; No orthopnea, PND Respiratory: Negative for cough, hemoptysis, wheezing and stridor. No pleuritic chestpain. Gastrointestinal: Negative for diarrhea, constipation,  melena, blood in stool, hematemesis, jaundice and rectal bleeding.    Genitourinary: Negative for frequency, dysuria, incontinence,flank pain and hematuria; Musculoskeletal: Negative for back pain and neck pain. Negative for swelling and trauma.;  Skin: . Negative for pruritus, rash, abrasions, bruising and skin lesion.; ulcerations Neuro: Negative for headache, lightheadedness and neck stiffness. Negative for weakness, altered level of consciousness , altered mental status, extremity weakness, burning feet, involuntary movement, seizure and syncope.  Psych: negative for anxiety, depression, insomnia, tearfulness, panic attacks, hallucinations, paranoia, suicidal or homicidal  ideation    Past Medical History  Diagnosis Date  . Hypertension   . Heart murmur   . ED (erectile dysfunction)   . Agranulocytosis ([place])   . Diabetes mellitus   . Hyperlipidemia   . Gout   . Adenomatous colon polyp   . Prostate cancer ([place])     Followed by Dr. Jeffie Pollock     Past Surgical History  Procedure Laterality Date  . Colonoscopy  2012    [name]: moderate sigmoid diverticulosis, internal hemorrhoids. next tcs 2017  . Sigmoidoscopy      Medications:  HOME MEDS: Prior to Admission medications   Medication Sig Start Date End Date Taking? Authorizing Provider  amLODipine (NORVASC) 10 MG tablet Take 1 tablet (10 mg total) by mouth every evening. 09/08/15  Yes Wardell Honour, MD  aspirin 81 MG tablet Take 81 mg by mouth every evening.    Yes Historical Provider, MD  benazepril (LOTENSIN) 40 MG tablet TAKE ONE TABLET BY MOUTH ONCE DAILY 09/08/15  Yes Wardell Honour, MD  febuxostat (ULORIC) 40 MG tablet Take 2 tablets (80 mg total) by mouth daily. 09/08/15  Yes Wardell Honour, MD  finasteride (PROSCAR) 5 MG tablet Take 1 tablet (5 mg total) by mouth daily. 05/11/15  Yes Wardell Honour, MD  furosemide (LASIX) 40 MG tablet Take one and half po qd 09/08/15  Yes Wardell Honour, MD  metFORMIN (GLUCOPHAGE) 500 MG tablet Take 1 tablet (500 mg total) by mouth 2 (two) times daily with a meal. 03/08/15  Yes Wardell Honour, MD  metoprolol succinate (TOPROL-XL) 100 MG 24 hr tablet Take 1 tablet (100 mg total) by mouth 1 day or 1 dose. 09/08/15  Yes Wardell Honour, MD  Multiple Vitamins-Minerals (MULTIVITAMIN PO)  Take 1 tablet by mouth daily.   Yes Historical Provider, MD  niacin (NIASPAN) 1000 MG CR tablet Take 1 tablet (1,000 mg total) by mouth at bedtime. 09/08/15  Yes Wardell Honour, MD  pravastatin (PRAVACHOL) 40 MG tablet Take 1 tablet (40 mg total) by mouth every evening. 09/08/15  Yes Wardell Honour, MD     Allergies:  Allergies  Allergen Reactions  . Allopurinol  Other (See Comments)    Significantly decreased WBC's  . Colchicine Other (See Comments)    Significantly decreased WBC's    Social History:   reports that he quit smoking about 28 years ago. His smoking use included Cigarettes. He has never used smokeless tobacco. He reports that he does not drink alcohol or use illicit drugs.  Family History: Family History  Problem Relation Age of Onset  . Stroke Mother   . Hypertension Mother   . Alzheimer's disease Mother   . Hypertension Father   . Cancer Sister     brain  . Cancer Sister     breast  . Colon cancer Neg Hx   . Pancreatic disease Neg Hx      Physical Exam: Filed Vitals:   10/11/15 0610 10/11/15 0815 10/11/15 1120 10/11/15 1135  BP: 192/76 196/69 204/76 210/80  Pulse: 96  67   Temp:   98.8 F (37.1 C)   TempSrc:   Oral   Resp:   22   Height:      Weight:      SpO2: 95%  97%    Blood pressure 210/80, pulse 67, temperature 98.8 F (37.1 C), temperature source Oral, resp. rate 22, height 5\' 5"  (1.651 m), weight 75.8 kg (167 lb 1.7 oz), SpO2 97 %.  GEN:  Pleasant  patient lying in the stretcher in no acute distress; cooperative with exam. PSYCH:  alert and oriented x4; does not appear anxious or depressed; affect is appropriate. HEENT: Mucous membranes pink and anicteric; PERRLA; EOM intact; no cervical lymphadenopathy nor thyromegaly or carotid bruit; no JVD; There were no stridor. Neck is very supple. Breasts:: Not examined CHEST WALL: No tenderness CHEST: Normal respiration, clear to auscultation bilaterally.  HEART: Regular rate and rhythm.  There are no murmur, rub, or gallops.   BACK: No kyphosis or scoliosis; no CVA tenderness ABDOMEN: soft and tender over the epigastric area.  No rebounds.  no masses, no organomegaly, normal abdominal bowel sounds; no pannus; no intertriginous candida. There is no rebound and no distention. Rectal Exam: Not done EXTREMITIES: No bone or joint deformity; age-appropriate  arthropathy of the hands and knees; no edema; no ulcerations.  There is no calf tenderness. Genitalia: not examined PULSES: 2+ and symmetric SKIN: Normal hydration no rash or ulceration CNS: Cranial nerves 2-12 grossly intact no focal lateralizing neurologic deficit.  Speech is fluent; uvula elevated with phonation, facial symmetry and tongue midline. DTR are normal bilaterally, cerebella exam is intact, barbinski is negative and strengths are equaled bilaterally.  No sensory loss.   Labs on Admission:  Basic Metabolic Panel:  Recent Labs Lab 10/07/15 0330 10/07/15 1540 10/08/15 0524 10/10/15 1321 10/11/15 0844  NA 140 141 142 136 139  K 3.2* 3.7 3.4* 3.4* 3.2*  CL 98* 106 108 108 110  CO2 25 25 26 22 23   GLUCOSE 223* 198* 144* 147* 141*  BUN 18 16 14 14 15   CREATININE 1.36* 1.13 1.04 0.72 0.82  CALCIUM 10.5* 9.2 8.6* 8.4* 8.3*  MG 1.5*  --   --   --   --  Liver Function Tests:  Recent Labs Lab 10/07/15 0330 10/08/15 0524 10/09/15 0552 10/10/15 0552 10/11/15 0844  AST 1093* 158* 56* 32 59*  ALT 557* 296* 167* 112* 103*  ALKPHOS 134* 95 76 76 97  BILITOT 3.3* 2.6* 2.1* 1.9* 3.2*  PROT 8.2* 6.2* 6.1* 6.2* 6.0*  ALBUMIN 4.4 3.0* 2.7* 2.5* 2.4*    Recent Labs Lab 10/07/15 0330 10/08/15 0524 10/09/15 0552 10/10/15 0552 10/11/15 0844  LIPASE 1609* 359* 77* 34 39   CBC:  Recent Labs Lab 10/07/15 0330 10/08/15 0524 10/10/15 1321 10/11/15 0844  WBC 9.5 17.1* 12.0* 10.8*  NEUTROABS  --   --  10.2*  --   HGB 15.6 14.1 12.3* 12.1*  HCT 42.9 40.6 35.2* 34.8*  MCV 90.7 93.5 91.4 91.3  PLT 317 268 206 210   Cardiac Enzymes: No results for input(s): CKTOTAL, CKMB, CKMBINDEX, TROPONINI in the last 168 hours.  CBG:  Recent Labs Lab 10/10/15 0722 10/10/15 1122 10/10/15 1619 10/10/15 2045 10/11/15 0724  GLUCAP 103* 130* 137* 140* 138*   Assessment/Plan Present on Admission:  . Pancreatitis . Obstructive jaundice . Elevated transaminase level . HTN  (hypertension) . HLD (hyperlipidemia) . Gout . Prostate cancer ([place]) . Hypercalcemia . Hypokalemia . Acute pancreatitis  PLAN:  Gallstone pancreatitis: Off antibiotics. This is OK with GI. LFTs came down nicely, and so was lipase. Unable to advance diet due to more abdominal pain, continue with NPO and IVF.  Agreed with repeating labs and imaging.   HTN:  Will resume all his home meds at this time.  Increase Hydralazine PRN to 10mg  IV Q 4 hours.   DM: Stable.  Hypokalemia: Supplemented.   Other plans as per orders.  Code Status: FULL Haskel Khan, MD. [facility] Pager [phone] 7pm to 7am.  10/11/2015, 11:44 AM

## 2015-10-11 NOTE — Progress Notes (Addendum)
DG abdomen showed N-J tube tip in gastric antrum. Patient currently lying on right side. Will request another DG abdomen within the hour to reassess location of tip.  Update 10/11/2015 1846: Second xray of abdomen revealed unchanged position for NJ tube. Paged Dr. Marin Comment; ordered guidewire to be removed and for NJ tube to be left as is. Will repeat imaging in the AM.   NJ tube not to be used for anything at this time.  Update 10/11/2015 1906: Guidewire removed per MD order. NJ tube not displaced during this time. Tube clamped and secured to gown. Patient denies any comfort at this time.

## 2015-10-11 NOTE — Plan of Care (Signed)
Problem: Phase I Progression Outcomes Goal: Hemodynamically stable Outcome: Progressing See flowsheet     

## 2015-10-11 NOTE — Progress Notes (Addendum)
    Subjective: Pain 5-6/10 in epigastric region. Hypertensive. No N/V. +flatus. "Don't feel good". Afebrile, pulse max 96. Current BP 192/76. Has only had ice chips.   Objective: Vital signs in last 24 hours: Temp:  [97.6 F (36.4 C)-98.6 F (37 C)] 97.8 F (36.6 C) (10/31 0355) Pulse Rate:  [69-96] 96 (10/31 0610) Resp:  [16-18] 16 (10/31 0355) BP: (153-199)/(71-87) 192/76 mmHg (10/31 0610) SpO2:  [95 %-97 %] 95 % (10/31 0610) Last BM Date: 10/10/15 General:   Alert and oriented, notable discomfort. Mild tachypnea  Head:  Normocephalic and atraumatic. Abdomen:  Bowel sounds present, distended but soft, TTP upper abdomen, umbilical hernia noted and easily reducible Neurologic:  Alert and  oriented x4 Psych:  Alert and cooperative. Anxious-appearing  Intake/Output from previous day: 10/30 0701 - 10/31 0700 In: 1331.3 [P.O.:600; I.V.:731.3] Out: 850 [Urine:850] Intake/Output this shift:    Lab Results:  Recent Labs  10/10/15 1321  WBC 12.0*  HGB 12.3*  HCT 35.2*  PLT 206   BMET  Recent Labs  10/10/15 1321  NA 136  K 3.4*  CL 108  CO2 22  GLUCOSE 147*  BUN 14  CREATININE 0.72  CALCIUM 8.4*   LFT  Recent Labs  10/09/15 0552 10/10/15 0552  PROT 6.1* 6.2*  ALBUMIN 2.7* 2.5*  AST 56* 32  ALT 167* 112*  ALKPHOS 76 76  BILITOT 2.1* 1.9*  BILIDIR 0.5 0.4  IBILI 1.6* 1.5*    Assessment: 74 year old male admitted with biliary pancreatitis, no need for ERCP this admission. Needs surgical consultation for elective cholecystectomy in near future. Has been slow to clinically improve and appears in obvious discomfort this morning despite backing off to NPO status (has only had ice chips). Physical exam concerning as patient appears to be in obvious discomfort, with associated tenderness on exam and query mild abdominal distension. Difficulty with hypertension this morning. Remains afebrile. With worsening clinical status, recheck CBC, HFP, BMP, lipase stat and  order repeat CT to assess for any interval changes.    Plan: HFP, CBC, BMP, lipase stat CT abd/pelvis to assess for interval changes Remain strictly NPO, including ice chips until after CT completed Further recommendations to follow   Orvil Feil, ANP-BC [facility]       LOS: 4 days    10/11/2015, 8:04 AM  Attending note;  Patient seen at 13:20 today  Reviewed CT with Dr. Thornton Papas. CT shows worsening of pancreatic/ peripancreatic inflammation. It delineates a necrotic segment between the body and head. No abscess or pseudocyst.  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 Bile duct well seen-not dilated and no filling defects.  Mild bump in bilirubin likely "bystander" phenomenon related to the degree of inflammation adjacent to the liver. I reviewed CT findings with patient and wife. Need for nasojejunal feedings reviewed. He will likely be in the hospital several more days. Will have radiology place the tube. Repletion of potassium per hospitalist attending.

## 2015-10-11 NOTE — Progress Notes (Signed)
MD notified/order to give patient 5mg  of metoprolol IV. Will recheck patient BP. RN will continue to monitor. Oswald Hillock, RN

## 2015-10-11 NOTE — Progress Notes (Signed)
Naso-jejunum tube placed per care order under supervision of Leonette Most, Therapist, sports. Tubing measured to 61cm and marked accordingly. Patient tolerated procedure without complaint. No resistance met when guiding the NJ tube. CO2 plate turned purple indicating no CO2 present.  DG abdomen pending.

## 2015-10-11 NOTE — Progress Notes (Signed)
Patient BP was 192/76 at 6 am.  RN rechecked BP and was 196/69. Pt is stable. MD notified/aware. RN will continue to monitor. Oswald Hillock, RN

## 2015-10-11 NOTE — Progress Notes (Signed)
Physical Therapy Treatment Patient Details Name: Alan [name]. MRN: [id number] DOB: Apr 23, 1941 Today's Date: 10/11/2015    History of Present Illness Pt is a 74yo black male who came to [facility] after N/V diahrrea, and RUQ pain at home. Pt found to have acute pancreatitis. At eval, all symptoms are well managed.     PT Comments    Pt reports feeling better with decreased abdominal pain.  He was found resting quietly in bed.  His BP was quite elevated this AM but received BP med.  He asked to be able to do a little bit of activity so as to minimize increased weakness.  We introduced very gentle supine exercise with ankle pumps, isometrics and ROM.  He tolerated this well.  RN arrived to give pt more meds for BP as she stated that it was still high.  No further PT was done.  Pt resting comfortably at end of visit.  Follow Up Recommendations  Home health PT     Equipment Recommendations  None recommended by PT    Recommendations for Other Services  none     Precautions / Restrictions Precautions Precaution Comments: elevated BPs Restrictions Weight Bearing Restrictions: No    Mobility  Bed Mobility               General bed mobility comments: BP too elevated for any activity out of the bed  Transfers                    Ambulation/Gait                 Stairs            Wheelchair Mobility    Modified Rankin (Stroke Patients Only)       Balance                                    Cognition Arousal/Alertness: Lethargic Behavior During Therapy: WFL for tasks assessed/performed Overall Cognitive Status: Within Functional Limits for tasks assessed                      Exercises General Exercises - Lower Extremity Ankle Circles/Pumps: AROM;Both;10 reps;Supine Quad Sets: AROM;Both;10 reps;Supine Gluteal Sets: AROM;Both;10 reps;Supine Short Arc Quad: AROM;Both;10 reps;Supine Heel Slides: AAROM;Both;10 reps;Supine     General Comments        Pertinent Vitals/Pain Pain Assessment: 0-10 Pain Score: 4  Pain Location: abdominal Pain Descriptors / Indicators: Aching Pain Intervention(s): Limited activity within patient's tolerance;Premedicated before session    Home Living                      Prior Function            PT Goals (current goals can now be found in the care plan section) Progress towards PT goals: Not progressing toward goals - comment (elevated BP...unable to get pt OOB)    Frequency  Min 3X/week    PT Plan Current plan remains appropriate    Co-evaluation             End of Session   Activity Tolerance: Treatment limited secondary to medical complications (Comment) (limited due to elevated BP) Patient left: in bed;with call bell/phone within reach;with nursing/sitter in room     Time: 0160-1093 PT Time Calculation (min) (ACUTE ONLY): 14 min  Charges:  $Therapeutic Exercise: 8-22 mins  G CodesSable Feil  PT 10/11/2015, 1:12 PM [phone]

## 2015-10-12 ENCOUNTER — Inpatient Hospital Stay (HOSPITAL_COMMUNITY): Payer: Medicare Other

## 2015-10-12 DIAGNOSIS — K859 Acute pancreatitis without necrosis or infection, unspecified: Secondary | ICD-10-CM | POA: Insufficient documentation

## 2015-10-12 DIAGNOSIS — Z4659 Encounter for fitting and adjustment of other gastrointestinal appliance and device: Secondary | ICD-10-CM | POA: Insufficient documentation

## 2015-10-12 LAB — CBC
HEMATOCRIT: 33 % — AB (ref 39.0–52.0)
HEMOGLOBIN: 11.7 g/dL — AB (ref 13.0–17.0)
MCH: 32.1 pg (ref 26.0–34.0)
MCHC: 35.5 g/dL (ref 30.0–36.0)
MCV: 90.7 fL (ref 78.0–100.0)
Platelets: 198 10*3/uL (ref 150–400)
RBC: 3.64 MIL/uL — AB (ref 4.22–5.81)
RDW: 14.1 % (ref 11.5–15.5)
WBC: 10.2 10*3/uL (ref 4.0–10.5)

## 2015-10-12 LAB — GLUCOSE, CAPILLARY
GLUCOSE-CAPILLARY: 138 mg/dL — AB (ref 65–99)
GLUCOSE-CAPILLARY: 138 mg/dL — AB (ref 65–99)
Glucose-Capillary: 133 mg/dL — ABNORMAL HIGH (ref 65–99)
Glucose-Capillary: 135 mg/dL — ABNORMAL HIGH (ref 65–99)
Glucose-Capillary: 103 mg/dL — ABNORMAL HIGH (ref 65–99)
Glucose-Capillary: 158 mg/dL — ABNORMAL HIGH (ref 65–99)

## 2015-10-12 LAB — COMPREHENSIVE METABOLIC PANEL
ALK PHOS: 108 U/L (ref 38–126)
ALT: 122 U/L — AB (ref 17–63)
ANION GAP: 7 (ref 5–15)
AST: 78 U/L — ABNORMAL HIGH (ref 15–41)
Albumin: 2.2 g/dL — ABNORMAL LOW (ref 3.5–5.0)
BILIRUBIN TOTAL: 3.2 mg/dL — AB (ref 0.3–1.2)
BUN: 14 mg/dL (ref 6–20)
CALCIUM: 8.2 mg/dL — AB (ref 8.9–10.3)
CO2: 22 mmol/L (ref 22–32)
CREATININE: 0.9 mg/dL (ref 0.61–1.24)
Chloride: 112 mmol/L — ABNORMAL HIGH (ref 101–111)
Glucose, Bld: 121 mg/dL — ABNORMAL HIGH (ref 65–99)
Potassium: 3.3 mmol/L — ABNORMAL LOW (ref 3.5–5.1)
Sodium: 141 mmol/L (ref 135–145)
TOTAL PROTEIN: 5.7 g/dL — AB (ref 6.5–8.1)

## 2015-10-12 MED ORDER — OSMOLITE 1.5 CAL PO LIQD
1000.0000 mL | ORAL | Status: DC
  Administered 2015-10-12: 1000 mL
  Filled 2015-10-12 (×3): qty 1000

## 2015-10-12 MED ORDER — HEPARIN SODIUM (PORCINE) 5000 UNIT/ML IJ SOLN
5000.0000 [IU] | Freq: Three times a day (TID) | INTRAMUSCULAR | Status: DC
  Administered 2015-10-12 – 2015-10-14 (×6): 5000 [IU] via SUBCUTANEOUS
  Filled 2015-10-12 (×6): qty 1

## 2015-10-12 MED ORDER — HYDROMORPHONE HCL 1 MG/ML IJ SOLN
1.0000 mg | Freq: Three times a day (TID) | INTRAMUSCULAR | Status: DC
  Administered 2015-10-13 – 2015-10-18 (×10): 1 mg via INTRAVENOUS
  Filled 2015-10-12 (×17): qty 1

## 2015-10-12 MED ORDER — IOHEXOL 300 MG/ML  SOLN
50.0000 mL | Freq: Once | INTRAMUSCULAR | Status: DC | PRN
  Administered 2015-10-12: 20 mL via ORAL
  Filled 2015-10-12: qty 50

## 2015-10-12 MED ORDER — JEVITY 1.2 CAL PO LIQD
1000.0000 mL | ORAL | Status: DC
  Filled 2015-10-12: qty 1000

## 2015-10-12 MED ORDER — OSMOLITE 1.5 CAL PO LIQD: 1000.0000 mL | ORAL | Status: DC

## 2015-10-12 MED ORDER — METOCLOPRAMIDE HCL 5 MG/ML IJ SOLN
10.0000 mg | Freq: Once | INTRAMUSCULAR | Status: AC
  Administered 2015-10-12: 10 mg via INTRAVENOUS
  Filled 2015-10-12: qty 2

## 2015-10-12 MED ORDER — JEVITY 1.2 CAL PO LIQD: 1000.0000 mL | ORAL | Status: DC

## 2015-10-12 NOTE — Progress Notes (Signed)
[facility] PROGRESS NOTE  Alan [name]. YBO:[id number] DOB: 1941/03/18    PCP:   Wardell Honour, MD   HPI:  Patient is a 74 yo male with hx of DM, HTN, prostate cancer, HLD, Gout, admitted by Dr Arnoldo Morale for obstructive cholelithiasis, pancreatitis, and cholecystitis. He has been NPO, and GI has been consulted. He likely has passed his stone, as his LFTs has improved markedly and so was his Lipase. GI doesn't think we need ERCP nor MRCP. When his diet was advanced, he couldn't tolerate it.  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  Nasojugunal tube was placed by IR, and in the Jujunum.    Rewiew of Systems:  Constitutional: Negative for malaise, fever and chills. No significant weight loss or weight gain Eyes: Negative for eye pain, redness and discharge, diplopia, visual changes, or flashes of light. ENMT: Negative for ear pain, hoarseness, nasal congestion, sinus pressure and sore throat. No headaches; tinnitus, drooling, or problem swallowing. Cardiovascular: Negative for chest pain, palpitations, diaphoresis, dyspnea and peripheral edema. ; No orthopnea, PND Respiratory: Negative for cough, hemoptysis, wheezing and stridor. No pleuritic chestpain. Gastrointestinal: Negative for nausea, vomiting, diarrhea, constipation, abdominal pain, melena, blood in stool, hematemesis, jaundice and rectal bleeding.    Genitourinary: Negative for frequency, dysuria, incontinence,flank pain and hematuria; Musculoskeletal: Negative for back pain and neck pain. Negative for swelling and trauma.;  Skin: . Negative for pruritus, rash, abrasions, bruising and skin lesion.; ulcerations Neuro: Negative for headache, lightheadedness and neck stiffness. Negative for weakness, altered level of consciousness , altered mental status, extremity weakness,  burning feet, involuntary movement, seizure and syncope.  Psych: negative for anxiety, depression, insomnia, tearfulness, panic attacks, hallucinations, paranoia, suicidal or homicidal ideation    Past Medical History  Diagnosis Date  . Hypertension   . Heart murmur   . ED (erectile dysfunction)   . Agranulocytosis ([place])   . Diabetes mellitus   . Hyperlipidemia   . Gout   . Adenomatous colon polyp   . Prostate cancer ([place])     Followed by Dr. Jeffie Pollock     Past Surgical History  Procedure Laterality Date  . Colonoscopy  2012    [name]: moderate sigmoid diverticulosis, internal hemorrhoids. next tcs 2017  . Sigmoidoscopy      Medications:  HOME MEDS: Prior to Admission medications   Medication Sig Start Date End Date Taking? Authorizing Provider  amLODipine (NORVASC) 10 MG tablet Take 1 tablet (10 mg total) by mouth every evening. 09/08/15  Yes Wardell Honour, MD  aspirin 81 MG tablet Take 81 mg by mouth every evening.    Yes Historical Provider, MD  benazepril (LOTENSIN) 40 MG tablet TAKE ONE TABLET BY MOUTH ONCE DAILY 09/08/15  Yes Wardell Honour, MD  febuxostat (ULORIC) 40 MG tablet Take 2 tablets (80 mg total) by mouth daily. 09/08/15  Yes Wardell Honour, MD  finasteride (PROSCAR) 5 MG tablet Take 1 tablet (5 mg total) by mouth daily. 05/11/15  Yes Wardell Honour, MD  furosemide (LASIX) 40 MG tablet Take one and half po qd 09/08/15  Yes Wardell Honour, MD  metFORMIN (GLUCOPHAGE) 500 MG tablet Take 1 tablet (500 mg total) by mouth 2 (two) times daily with a meal. 03/08/15  Yes Wardell Honour, MD  metoprolol succinate (TOPROL-XL) 100 MG 24 hr tablet Take 1 tablet (  100 mg total) by mouth 1 day or 1 dose. 09/08/15  Yes Wardell Honour, MD  Multiple Vitamins-Minerals (MULTIVITAMIN PO) Take 1 tablet by mouth daily.   Yes Historical Provider, MD  niacin (NIASPAN) 1000 MG CR tablet Take 1 tablet (1,000 mg total) by mouth at bedtime. 09/08/15  Yes Wardell Honour, MD   pravastatin (PRAVACHOL) 40 MG tablet Take 1 tablet (40 mg total) by mouth every evening. 09/08/15  Yes Wardell Honour, MD     Allergies:  Allergies  Allergen Reactions  . Allopurinol Other (See Comments)    Significantly decreased WBC's  . Colchicine Other (See Comments)    Significantly decreased WBC's    Social History:   reports that he quit smoking about 28 years ago. His smoking use included Cigarettes. He has never used smokeless tobacco. He reports that he does not drink alcohol or use illicit drugs.  Family History: Family History  Problem Relation Age of Onset  . Stroke Mother   . Hypertension Mother   . Alzheimer's disease Mother   . Hypertension Father   . Cancer Sister     brain  . Cancer Sister     breast  . Colon cancer Neg Hx   . Pancreatic disease Neg Hx      Physical Exam: Filed Vitals:   10/12/15 0516 10/12/15 0949 10/12/15 1201 10/12/15 1425  BP: 186/73 153/63 186/63 180/65  Pulse: 77 79 78 67  Temp: 97.8 F (36.6 C) 99.5 F (37.5 C)  97.4 F (36.3 C)  TempSrc: Oral Oral  Oral  Resp: 20 18  18   Height:      Weight:      SpO2: 95% 95%  96%   Blood pressure 180/65, pulse 67, temperature 97.4 F (36.3 C), temperature source Oral, resp. rate 18, height 5\' 5"  (1.651 m), weight 75.8 kg (167 lb 1.7 oz), SpO2 96 %.  GEN:  Pleasant  patient lying in the stretcher in no acute distress; cooperative with exam. PSYCH:  alert and oriented x4; does not appear anxious or depressed; affect is appropriate. HEENT: Mucous membranes pink and anicteric; PERRLA; EOM intact; no cervical lymphadenopathy nor thyromegaly or carotid bruit; no JVD; There were no stridor. Neck is very supple. Breasts:: Not examined CHEST WALL: No tenderness CHEST: Normal respiration, clear to auscultation bilaterally.  HEART: Regular rate and rhythm.  There are no murmur, rub, or gallops.   BACK: No kyphosis or scoliosis; no CVA tenderness ABDOMEN: soft and non-tender; no masses, no  organomegaly, normal abdominal bowel sounds; no pannus; no intertriginous candida. There is no rebound and no distention. Rectal Exam: Not done EXTREMITIES: No bone or joint deformity; age-appropriate arthropathy of the hands and knees; no edema; no ulcerations.  There is no calf tenderness. Genitalia: not examined PULSES: 2+ and symmetric SKIN: Normal hydration no rash or ulceration CNS: Cranial nerves 2-12 grossly intact no focal lateralizing neurologic deficit.  Speech is fluent; uvula elevated with phonation, facial symmetry and tongue midline. DTR are normal bilaterally, cerebella exam is intact, barbinski is negative and strengths are equaled bilaterally.  No sensory loss.   Labs on Admission:  Basic Metabolic Panel:  Recent Labs Lab 10/07/15 0330 10/07/15 1540 10/08/15 0524 10/10/15 1321 10/11/15 0844 10/12/15 1001  NA 140 141 142 136 139 141  K 3.2* 3.7 3.4* 3.4* 3.2* 3.3*  CL 98* 106 108 108 110 112*  CO2 25 25 26 22 23 22   GLUCOSE 223* 198* 144* 147* 141* 121*  BUN 18 16 14 14 15 14   CREATININE 1.36* 1.13 1.04 0.72 0.82 0.90  CALCIUM 10.5* 9.2 8.6* 8.4* 8.3* 8.2*  MG 1.5*  --   --   --   --   --    Liver Function Tests:  Recent Labs Lab 10/08/15 0524 10/09/15 0552 10/10/15 0552 10/11/15 0844 10/12/15 1001  AST 158* 56* 32 59* 78*  ALT 296* 167* 112* 103* 122*  ALKPHOS 95 76 76 97 108  BILITOT 2.6* 2.1* 1.9* 3.2* 3.2*  PROT 6.2* 6.1* 6.2* 6.0* 5.7*  ALBUMIN 3.0* 2.7* 2.5* 2.4* 2.2*    Recent Labs Lab 10/07/15 0330 10/08/15 0524 10/09/15 0552 10/10/15 0552 10/11/15 0844  LIPASE 1609* 359* 77* 34 39   CBC:  Recent Labs Lab 10/07/15 0330 10/08/15 0524 10/10/15 1321 10/11/15 0844 10/12/15 1001  WBC 9.5 17.1* 12.0* 10.8* 10.2  NEUTROABS  --   --  10.2*  --   --   HGB 15.6 14.1 12.3* 12.1* 11.7*  HCT 42.9 40.6 35.2* 34.8* 33.0*  MCV 90.7 93.5 91.4 91.3 90.7  PLT 317 268 206 210 198   CBG:  Recent Labs Lab 10/11/15 2126 10/12/15 0042  10/12/15 0521 10/12/15 0717 10/12/15 1121  GLUCAP 146* 138* 133* 135* 103*     Radiological Exams on Admission: Dg Abd 1 View  10/12/2015  CLINICAL DATA:  Dobbhoff tube -------to see if in jejumum EXAM: ABDOMEN - 1 VIEW COMPARISON:  the previous day's study FINDINGS: Feeding tube tip remains the gastric body. A few gas distended small bowel loops in the mid abdomen. Residual contrast in the nondilated colon, rectum and appendix. Right pelvic vascular calcifications. Regional bones unremarkable. IMPRESSION: 1. Feeding tube in the gastric body. 2. Nonobstructive bowel gas pattern with residual colonic contrast material. Electronically Signed   By: Lucrezia Europe M.D.   On: 10/12/2015 09:29   Ct Abdomen Pelvis W Contrast  10/11/2015  CLINICAL DATA:  Upper abdominal pain. Recently diagnosed with acute pancreatitis. Elevated liver function tests. Obstructive cholelithiasis. Prostate cancer. EXAM: CT ABDOMEN AND PELVIS WITH CONTRAST TECHNIQUE: Multidetector CT imaging of the abdomen and pelvis was performed using the standard protocol following bolus administration of intravenous contrast. CONTRAST:  141mL OMNIPAQUE IOHEXOL 300 MG/ML  SOLN COMPARISON:  10/07/2015 right upper quadrant abdominal sonogram and CT abdomen/pelvis. FINDINGS: Lower chest: New small bilateral layering pleural effusions with segmental passive atelectasis in both lower lobes. There is atherosclerosis of the coronary arteries, including calcified atherosclerotic plaque in the left main, left circumflex and right coronary arteries. Oral contrast is present in the lower thoracic esophageal lumen, in keeping with esophageal dysmotility and/ gastroesophageal reflux. Hepatobiliary: Normal liver with no liver mass. The nondistended gallbladder contains several calcified gallstones measuring up to 2.0 cm. There is stable mild nonspecific diffuse gallbladder wall thickening without significant pericholecystic fat stranding. No intrahepatic biliary  ductal dilatation. Common bile duct measures 6 mm diameter, within normal limits. No radiopaque choledocholithiasis. Pancreas: There is worsening thickening of the pancreatic head and neck with worsening peripancreatic fat stranding and ill-defined fluid. There is nonenhancement of much of the pancreatic neck. These findings are in keeping with worsening necrotizing acute pancreatitis. There is a new 6.1 x 2.5 cm peripancreatic fluid collection between the distal stomach and pancreatic head (series 2/image 34). There is a new small 2.6 x 1.8 cm peripancreatic fluid collection along the proximal greater curvature of the stomach anterior to the pancreatic tail (2/25). There is new minimal main pancreatic duct dilation in the  pancreatic body. No discrete pancreatic mass is detected. Spleen: Normal size. No mass. Adrenals/Urinary Tract: Normal adrenals. Stable subcentimeter hypodense lesion in the lateral upper right kidney, too small to characterize. Stable subcentimeter hypodense lesion in the interpolar left kidney, too small to characterize. Otherwise normal kidneys, with no hydronephrosis. Collapsed and grossly normal bladder. Stomach/Bowel: Stable small hiatal hernia. Otherwise collapsed and grossly normal stomach. Normal caliber small bowel with no small bowel wall thickening. Re- demonstrated is a small umbilical hernia containing a small bowel loop, with no small bowel wall thickening, pneumatosis or focal small bowel caliber transition. Ascites fluid is also present within the umbilical hernia. Normal appendix. There is moderate distal colonic diverticulosis, most prominent in the sigmoid colon. No large bowel wall thickening or pericolonic fat stranding. Oral contrast traverses to the distal rectum. Vascular/Lymphatic: Atherosclerotic nonaneurysmal abdominal aorta. There is interval development of acute thrombosis of the splenic vein along the entire length of the pancreas, with patency of the splenic vein at  the splenic hilum. The superior mesenteric vein and portal veins remain patent. The hepatic and renal veins remain patent. No pathologically enlarged lymph nodes in the abdomen or pelvis. Reproductive: Mild prostatomegaly. 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 Other: No pneumoperitoneum. Increased small volume ascites in the pericolic gutters and pelvis. Musculoskeletal: No aggressive appearing focal osseous lesions. Moderate degenerative changes in the visualized thoracolumbar spine. Stable small fat containing right inguinal hernia. IMPRESSION: 1. Worsening acute necrotizing pancreatitis, with nonenhancement of much of the pancreatic neck. 2. New complication of splenic vein thrombosis. Portal veins remain patent. 3. New complication of peripancreatic fluid collections as described, which are at risk of becoming pancreatic pseudocysts. Follow-up CT abdomen/pelvis with intravenous contrast is advised in 1 month. 4. Small volume ascites, increased. 5. New small bilateral pleural effusions. 6. Cholelithiasis. Stable mild nonspecific gallbladder wall thickening, favor chronic cholecystitis given the absence of pericholecystic fat stranding. No biliary ductal dilatation. 7. Additional chronic findings as above, including small umbilical hernia containing a small bowel loop, with no evidence of bowel obstruction or bowel incarceration/ischemia. Electronically Signed   By: Ilona Sorrel M.D.   On: 10/11/2015 13:12   Dg Fluoro Rm 1-60 Min  10/12/2015  CLINICAL DATA:  Pancreatitis, feeding tube positioning beyond ligament of Treitz EXAM: FLOURO RM 1-60 MIN: FEEDING TUBE PLACEMENT CONTRAST:  33mL OMNIPAQUE IOHEXOL 300 MG/ML  SOLN FLUOROSCOPY TIME:  Radiation Exposure Index (as provided by the fluoroscopic device): Not provided If the device does not provide the exposure index: Fluoroscopy Time (in minutes and seconds):  6 minutes 12 seconds Number of  Acquired Images:  4 screen captures during fluoroscopy COMPARISON:  None FINDINGS: Patient's indwelling feeding tube was manipulated under fluoroscopy. A 0.038 guidewire was placed through the tube. The tip was located within the stomach initially. Under fluoroscopic guidance, the tip was manipulated to the distal gastric antrum, across the pylorus, through the duodenum and placed beyond ligament of Treitz. Injection of 20 cc of Omnipaque 300 confirms positioning within proximal jejunum. Procedure tolerated very well by patient. IMPRESSION: Positioning of the feeding tube beyond the ligament of Treitz in the proximal jejunum. Electronically Signed   By: Lavonia Dana M.D.   On: 10/12/2015 11:23   Dg Abd Portable 1v  10/11/2015  CLINICAL DATA:  Status post feeding catheter placement EXAM: PORTABLE ABDOMEN - 1 VIEW COMPARISON:  10/11/2015 1608 hours FINDINGS: Scattered large and small bowel gas is noted. Contrast material is noted throughout  the colon from the recent CT examination. A feeding catheter is again seen in the mid stomach and unchanged. No other focal abnormality is noted. IMPRESSION: Stable appearance of feeding catheter when compared with the previous exam Electronically Signed   By: Inez Catalina M.D.   On: 10/11/2015 18:28   Dg Abd Portable 1v  10/11/2015  CLINICAL DATA:  Nasogastric tube placement. Right upper quadrant abdominal pain. EXAM: PORTABLE ABDOMEN - 1 VIEW COMPARISON:  CT same day FINDINGS: Soft feeding tube enters the stomach and has its tip in the region of the antrum. Previously administered oral contrast present within the colon. Previously administered intravenous contrast is noted in the urinary tract. IMPRESSION: Soft feeding tube tip in the gastric antrum. Electronically Signed   By: Nelson Chimes M.D.   On: 10/11/2015 16:28   Assessment/Plan Present on Admission:  . Pancreatitis . Obstructive jaundice . Elevated transaminase level . HTN (hypertension) . HLD  (hyperlipidemia) . Gout . Prostate cancer ([place]) . Hypercalcemia . Hypokalemia . Acute pancreatitis  PLAN:  Acute pancreatitis, with passed stone, and pancreatic necrosis, with splenic vein thrombosis:   Will start TF thru the nasoJujunum tube and top at 30cc per hour for now.  Requested nutrition consultation.  He will likely not able to eat for a few days.    HTN: Will resume all his home meds at this time. Increase Hydralazine PRN to 10mg  IV Q 4 hours.   DM: Stable.  Hypokalemia: Supplemented.   Other plans as per orders.  Code Status: FULL Haskel Khan, MD. [facility] Pager [phone] 7pm to 7am.  10/12/2015, 3:23 PM

## 2015-10-12 NOTE — Progress Notes (Signed)
Physical Therapy Treatment Patient Details Name: Alan [name]. MRN: [id number] DOB: 27-Oct-1941 Today's Date: 10/12/2015    History of Present Illness Pt is a 74yo black male who came to [facility] after N/V diahrrea, and RUQ pain at home. Pt found to have acute pancreatitis. At eval, all symptoms are well managed.     PT Comments    Pt was seen for PT this afternoon.  He was slightly drowsy but able to awaken and cooperate.  His BP was 169/64.  He was able to tolerate gentle supine bed exercise with no difficulty.  He transferred supine to sit without assist and was able to stand to a walker and ambulate 49' with a stable gait pattern.  He then transferred to a chair.  Follow Up Recommendations  Home health PT     Equipment Recommendations  None recommended by PT    Recommendations for Other Services       Precautions / Restrictions Precautions Precautions: Fall Restrictions Weight Bearing Restrictions: No    Mobility  Bed Mobility                  Transfers Overall transfer level: Needs assistance Equipment used: Rolling walker (2 wheeled) Transfers: Sit to/from Stand Sit to Stand: Supervision            Ambulation/Gait Ambulation/Gait assistance: Supervision Ambulation Distance (Feet): 90 Feet Assistive device: Rolling walker (2 wheeled) Gait Pattern/deviations: WFL(Within Functional Limits)   Gait velocity interpretation: <1.8 ft/sec, indicative of risk for recurrent falls General Gait Details: gait very stable with a walker and will be of benefit due to general deconditioning.   Stairs            Wheelchair Mobility    Modified Rankin (Stroke Patients Only)       Balance   Sitting-balance support: No upper extremity supported;Feet supported Sitting balance-Leahy Scale: Normal     Standing balance support: Bilateral upper extremity supported Standing balance-Leahy Scale: Good                      Cognition Arousal/Alertness:  Awake/alert Behavior During Therapy: WFL for tasks assessed/performed Overall Cognitive Status: Within Functional Limits for tasks assessed                      Exercises General Exercises - Lower Extremity Ankle Circles/Pumps: AROM;Both;10 reps;Supine Quad Sets: AROM;Both;10 reps;Supine Gluteal Sets: AROM;Both;10 reps;Supine Heel Slides: AAROM;Both;10 reps;Supine Straight Leg Raises: AROM;Both;5 reps;Supine    General Comments        Pertinent Vitals/Pain Pain Assessment: No/denies pain (premedicated for pain)    Home Living                      Prior Function            PT Goals (current goals can now be found in the care plan section) Progress towards PT goals: Progressing toward goals    Frequency  Min 3X/week    PT Plan Current plan remains appropriate    Co-evaluation             End of Session Equipment Utilized During Treatment: Gait belt Activity Tolerance: Patient tolerated treatment well Patient left: in chair;with call bell/phone within reach;with family/visitor present     Time: 7782-4235 PT Time Calculation (min) (ACUTE ONLY): 33 min  Charges:  $Gait Training: 8-22 mins $Therapeutic Exercise: 8-22 mins  G CodesSable Feil  PT 10/12/2015, 1:35 PM [phone]

## 2015-10-12 NOTE — Progress Notes (Signed)
Subjective: Abdominal pain much improved. No N/V. +flatus. Dobhoff attempted to be placed yesterday but remains in gastric antrum as of yesterday on xray. Guidewire removed yesterday.   Objective: Vital signs in last 24 hours: Temp:  [97.8 F (36.6 C)-99.3 F (37.4 C)] 97.8 F (36.6 C) (11/01 0516) Pulse Rate:  [67-77] 77 (11/01 0516) Resp:  [20-22] 20 (11/01 0516) BP: (183-210)/(66-80) 186/73 mmHg (11/01 0516) SpO2:  [95 %-97 %] 95 % (11/01 0516) Last BM Date: 10/10/15 General:   Alert and oriented, pleasant Head:  Normocephalic and atraumatic. Eyes:  No icterus, sclera clear. Conjuctiva pink.  Mouth:  Without lesions, mucosa pink and moist.  Abdomen:  Bowel sounds present, soft, non-tender, non-distended. Small umbilical hernia Neurologic:  Alert and  oriented x4 Psych:  Alert and cooperative. Normal mood and affect.  Intake/Output from previous day: 10/31 0701 - 11/01 0700 In: 1428.8 [I.V.:1428.8] Out: 325 [Urine:325] Intake/Output this shift:    Lab Results:  Recent Labs  10/10/15 1321 10/11/15 0844  WBC 12.0* 10.8*  HGB 12.3* 12.1*  HCT 35.2* 34.8*  PLT 206 210   BMET  Recent Labs  10/10/15 1321 10/11/15 0844  NA 136 139  K 3.4* 3.2*  CL 108 110  CO2 22 23  GLUCOSE 147* 141*  BUN 14 15  CREATININE 0.72 0.82  CALCIUM 8.4* 8.3*   LFT  Recent Labs  10/10/15 0552 10/11/15 0844  PROT 6.2* 6.0*  ALBUMIN 2.5* 2.4*  AST 32 59*  ALT 112* 103*  ALKPHOS 76 97  BILITOT 1.9* 3.2*  BILIDIR 0.4 1.1*  IBILI 1.5* 2.1*    Studies/Results: Ct Abdomen Pelvis W Contrast  10/11/2015  CLINICAL DATA:  Upper abdominal pain. Recently diagnosed with acute pancreatitis. Elevated liver function tests. Obstructive cholelithiasis. Prostate cancer. EXAM: CT ABDOMEN AND PELVIS WITH CONTRAST TECHNIQUE: Multidetector CT imaging of the abdomen and pelvis was performed using the standard protocol following bolus administration of intravenous contrast. CONTRAST:  12mL  OMNIPAQUE IOHEXOL 300 MG/ML  SOLN COMPARISON:  10/07/2015 right upper quadrant abdominal sonogram and CT abdomen/pelvis. FINDINGS: Lower chest: New small bilateral layering pleural effusions with segmental passive atelectasis in both lower lobes. There is atherosclerosis of the coronary arteries, including calcified atherosclerotic plaque in the left main, left circumflex and right coronary arteries. Oral contrast is present in the lower thoracic esophageal lumen, in keeping with esophageal dysmotility and/ gastroesophageal reflux. Hepatobiliary: Normal liver with no liver mass. The nondistended gallbladder contains several calcified gallstones measuring up to 2.0 cm. There is stable mild nonspecific diffuse gallbladder wall thickening without significant pericholecystic fat stranding. No intrahepatic biliary ductal dilatation. Common bile duct measures 6 mm diameter, within normal limits. No radiopaque choledocholithiasis. Pancreas: There is worsening thickening of the pancreatic head and neck with worsening peripancreatic fat stranding and ill-defined fluid. There is nonenhancement of much of the pancreatic neck. These findings are in keeping with worsening necrotizing acute pancreatitis. There is a new 6.1 x 2.5 cm peripancreatic fluid collection between the distal stomach and pancreatic head (series 2/image 34). There is a new small 2.6 x 1.8 cm peripancreatic fluid collection along the proximal greater curvature of the stomach anterior to the pancreatic tail (2/25). There is new minimal main pancreatic duct dilation in the pancreatic body. No discrete pancreatic mass is detected. Spleen: Normal size. No mass. Adrenals/Urinary Tract: Normal adrenals. Stable subcentimeter hypodense lesion in the lateral upper right kidney, too small to characterize. Stable subcentimeter hypodense lesion in the interpolar left kidney, too small  to characterize. Otherwise normal kidneys, with no hydronephrosis. Collapsed and  grossly normal bladder. Stomach/Bowel: Stable small hiatal hernia. Otherwise collapsed and grossly normal stomach. Normal caliber small bowel with no small bowel wall thickening. Re- demonstrated is a small umbilical hernia containing a small bowel loop, with no small bowel wall thickening, pneumatosis or focal small bowel caliber transition. Ascites fluid is also present within the umbilical hernia. Normal appendix. There is moderate distal colonic diverticulosis, most prominent in the sigmoid colon. No large bowel wall thickening or pericolonic fat stranding. Oral contrast traverses to the distal rectum. Vascular/Lymphatic: Atherosclerotic nonaneurysmal abdominal aorta. There is interval development of acute thrombosis of the splenic vein along the entire length of the pancreas, with patency of the splenic vein at the splenic hilum. The superior mesenteric vein and portal veins remain patent. The hepatic and renal veins remain patent. No pathologically enlarged lymph nodes in the abdomen or pelvis. Reproductive: Mild prostatomegaly. 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 Other: No pneumoperitoneum. Increased small volume ascites in the pericolic gutters and pelvis. Musculoskeletal: No aggressive appearing focal osseous lesions. Moderate degenerative changes in the visualized thoracolumbar spine. Stable small fat containing right inguinal hernia. IMPRESSION: 1. Worsening acute necrotizing pancreatitis, with nonenhancement of much of the pancreatic neck. 2. New complication of splenic vein thrombosis. Portal veins remain patent. 3. New complication of peripancreatic fluid collections as described, which are at risk of becoming pancreatic pseudocysts. Follow-up CT abdomen/pelvis with intravenous contrast is advised in 1 month. 4. Small volume ascites, increased. 5. New small bilateral pleural effusions. 6. Cholelithiasis. Stable mild nonspecific  gallbladder wall thickening, favor chronic cholecystitis given the absence of pericholecystic fat stranding. No biliary ductal dilatation. 7. Additional chronic findings as above, including small umbilical hernia containing a small bowel loop, with no evidence of bowel obstruction or bowel incarceration/ischemia. Electronically Signed   By: Ilona Sorrel M.D.   On: 10/11/2015 13:12   Dg Abd Portable 1v  10/11/2015  CLINICAL DATA:  Status post feeding catheter placement EXAM: PORTABLE ABDOMEN - 1 VIEW COMPARISON:  10/11/2015 1608 hours FINDINGS: Scattered large and small bowel gas is noted. Contrast material is noted throughout the colon from the recent CT examination. A feeding catheter is again seen in the mid stomach and unchanged. No other focal abnormality is noted. IMPRESSION: Stable appearance of feeding catheter when compared with the previous exam Electronically Signed   By: Inez Catalina M.D.   On: 10/11/2015 18:28   Dg Abd Portable 1v  10/11/2015  CLINICAL DATA:  Nasogastric tube placement. Right upper quadrant abdominal pain. EXAM: PORTABLE ABDOMEN - 1 VIEW COMPARISON:  CT same day FINDINGS: Soft feeding tube enters the stomach and has its tip in the region of the antrum. Previously administered oral contrast present within the colon. Previously administered intravenous contrast is noted in the urinary tract. IMPRESSION: Soft feeding tube tip in the gastric antrum. Electronically Signed   By: Nelson Chimes M.D.   On: 10/11/2015 16:28    Assessment: 74 year old male with acute, complicated pancreatitis with recent CT noting worsening of pancreatic/peripancreatic inflammation and necrotic segment between the body and the head. New splenic artery thrombosis. No need for antibiotics unless worsening clinical status to include fever, leukocytosis, etc. Needs nasojejunal feedings, with tube attempted to be placed by nursing staff yesterday. Abd xray X 2 revealed the tip in stomach. Will likely need  radiology assistance to guide into jejunum. Repeat xray today in case peristalsis has  facilitated further advancement. Doubt this will be the case but will check this first. Will need to talk with radiology regarding further advancement attempt under fluoro.   Elevated LFTs: without evidence of biliary obstruction. Likely bystander reaction.    Plan: Repeat abd xray now Remain NPO CBC, CMP today Will contact radiology for placement attempt if still not in appropriate position after review of xray this morning Hold on antibiotics unless signs/symptoms of clinical deterioration/sepsis   Orvil Feil, ANP-BC [facility]    LOS: 5 days    10/12/2015, 8:05 AM    Addendum at 1000: Give Reglan 10 mg IV X 1. Spoke with Dr. Thornton Papas, who will attempt advancement of Dobhoff under fluoro. Orders placed. May ultimately need TPN if this is unsuccessful.

## 2015-10-12 NOTE — Progress Notes (Signed)
Initial Nutrition Assessment  DOCUMENTATION CODES:   Not applicable  INTERVENTION:  Initiate Osmolite 1.5 @ 30 ml/hr via naso-jejunal tube and increase by 10 ml every 4 hours to goal rate of 60 ml/hr.   Tube feeding regimen provides 2160 kcal (100% of energy needs), 90 grams of protein, and 1097 ml of H2O.    IVF-D5 NS @ 75 ml/hr   NUTRITION DIAGNOSIS:   Inadequate oral intake related to altered GI function as evidenced by NPO status.   GOAL:   Patient will meet greater than or equal to 90% of their needs  MONITOR:   TF tolerance, Weight trends, Labs  REASON FOR ASSESSMENT:   Consult Enteral/tube feeding initiation and management  ASSESSMENT: Pt presented to ED on 10/27 with abdominal pain, nausea and diarrhea.  Pt has pancreatitis and unable to successfully advance his diet. An NJ has been placed/confirmed below the ligament of treitz. Pt  has been cleared to start enteral feeding.  His weight is down 4% this month which is likely related to his recent intolerance of oral intake.  Labs: potassium 3.3, glucose 121  Diet Order:  Diet NPO time specified Except for: Ice Chips  Skin:   intact  Last BM:   11/1-abdomen soft but distended per nursing  Height:   Ht Readings from Last 1 Encounters:  10/07/15 5\' 5"  (1.651 m)    Weight:   Wt Readings from Last 1 Encounters:  10/07/15 167 lb 1.7 oz (75.8 kg)    Ideal Body Weight:  61.8 kg  BMI:  Body mass index is 27.81 kg/(m^2).  Estimated Nutritional Needs:   Kcal:  1860-2170  Protein:  98-114 gr  Fluid:  1.9-2.2 liters daily  EDUCATION NEEDS:   No education needs identified at this time  Colman Cater MS,RD,CSG,LDN Office: #[phone] Pager: [phone]

## 2015-10-13 ENCOUNTER — Inpatient Hospital Stay (HOSPITAL_COMMUNITY): Payer: Medicare Other

## 2015-10-13 ENCOUNTER — Encounter (HOSPITAL_COMMUNITY): Payer: Self-pay

## 2015-10-13 DIAGNOSIS — C61 Malignant neoplasm of prostate: Secondary | ICD-10-CM

## 2015-10-13 DIAGNOSIS — M109 Gout, unspecified: Secondary | ICD-10-CM

## 2015-10-13 DIAGNOSIS — I10 Essential (primary) hypertension: Secondary | ICD-10-CM

## 2015-10-13 DIAGNOSIS — E876 Hypokalemia: Secondary | ICD-10-CM

## 2015-10-13 LAB — GLUCOSE, CAPILLARY
GLUCOSE-CAPILLARY: 116 mg/dL — AB (ref 65–99)
GLUCOSE-CAPILLARY: 137 mg/dL — AB (ref 65–99)
GLUCOSE-CAPILLARY: 154 mg/dL — AB (ref 65–99)
Glucose-Capillary: 139 mg/dL — ABNORMAL HIGH (ref 65–99)
Glucose-Capillary: 122 mg/dL — ABNORMAL HIGH (ref 65–99)
Glucose-Capillary: 135 mg/dL — ABNORMAL HIGH (ref 65–99)

## 2015-10-13 MED ORDER — SODIUM CHLORIDE 0.9 % IJ SOLN
10.0000 mL | Freq: Two times a day (BID) | INTRAMUSCULAR | Status: DC
  Administered 2015-10-13 – 2015-10-19 (×13): 10 mL

## 2015-10-13 MED ORDER — INSULIN ASPART 100 UNIT/ML ~~LOC~~ SOLN
0.0000 [IU] | SUBCUTANEOUS | Status: DC
  Administered 2015-10-13 (×2): 2 [IU] via SUBCUTANEOUS
  Administered 2015-10-14 (×2): 3 [IU] via SUBCUTANEOUS
  Administered 2015-10-14: 2 [IU] via SUBCUTANEOUS
  Administered 2015-10-14: 3 [IU] via SUBCUTANEOUS
  Administered 2015-10-15 – 2015-10-17 (×12): 2 [IU] via SUBCUTANEOUS

## 2015-10-13 MED ORDER — KCL IN DEXTROSE-NACL 20-5-0.9 MEQ/L-%-% IV SOLN
INTRAVENOUS | Status: DC
  Administered 2015-10-15 – 2015-10-16 (×2): via INTRAVENOUS

## 2015-10-13 MED ORDER — TRACE MINERALS CR-CU-MN-SE-ZN 10-1000-500-60 MCG/ML IV SOLN
INTRAVENOUS | Status: AC
  Administered 2015-10-13: 18:00:00 via INTRAVENOUS
  Filled 2015-10-13: qty 960

## 2015-10-13 MED ORDER — SODIUM CHLORIDE 0.9 % IJ SOLN: 10.0000 mL | INTRAMUSCULAR | Status: DC | PRN

## 2015-10-13 NOTE — Progress Notes (Signed)
Upon reviewing chart I noted a signed and held order for a portable chest xray for PICC line placement entered by the endoscopy RN. At 2225 I clamped the PICC line and stopped the infusion until the xray could be performed and verified. Portable chest xray performed at bedside and results were signed by the Radiologist stating "satisfactory position of right upper extremity PICC line".  Infusion restarted via pump to PICC line and site is clean, dry and intact.

## 2015-10-13 NOTE — Progress Notes (Signed)
[facility] PROGRESS NOTE  Alan [name]. EHU:[id number] DOB: 02-17-1941 DOA: 10/07/2015 PCP: Wardell Honour, MD  Assessment/Plan: 1. 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 Lipase WNL. Patient had NJ tube placed by radiology for tube feeds on 10/31. It was completely dislodged this morning. Per GI, plan will be to place PICC line and give TPN per pharmacy. Recommendations were to not replace the tube at this time. Continue NPO and pain management. Nutrition following.  2. Hypokalemia, will replete. 3. DM type 2, stable. Continue SSI 4. Essential HTN, stable. 5. Prostate CA.  Followed by urology in [place].  6. HLD, continue statin.  7. Gout, no evidence of acute flares.  Code Status: Full DVT prophylaxis: SCDs Family Communication:Family at bedside. Discussed with patient who understands and has no concerns at this time. Disposition Plan: Anticipate discharge in 2-3 days.    Consultants:  GI  PT- HH  Procedures:  NJ tube 10/31>>11/2  PICC line 11/2>>  Antibiotics:    HPI/Subjective: Feels a little better. Still has intermittent lower abdominal pain. Denies nausea or vomiting.   Objective: Filed Vitals:   10/13/15 0538  BP: 163/67  Pulse: 84  Temp: 98 F (36.7 C)  Resp: 20    Intake/Output Summary (Last 24 hours) at 10/13/15 0849 Last data filed at 10/13/15 0251  Gross per 24 hour  Intake   1738 ml  Output      0 ml  Net   1738 ml   Filed Weights   10/07/15 0311 10/07/15 0648 10/13/15 0530  Weight: 81.647 kg (180 lb) 75.8 kg (167 lb 1.7 oz) 76.8 kg (169 lb 5 oz)    Exam:  General: NAD, looks comfortable Cardiovascular: RRR, S1, S2  Respiratory: clear bilaterally, No wheezing, rales or rhonchi Abdomen: soft, non tender, no distention , bowel sounds normal Musculoskeletal: No edema b/l  Data Reviewed: Basic Metabolic Panel:  Recent Labs Lab 10/07/15 0330 10/07/15 1540 10/08/15 0524 10/10/15 1321  10/11/15 0844 10/12/15 1001  NA 140 141 142 136 139 141  K 3.2* 3.7 3.4* 3.4* 3.2* 3.3*  CL 98* 106 108 108 110 112*  CO2 25 25 26 22 23 22   GLUCOSE 223* 198* 144* 147* 141* 121*  BUN 18 16 14 14 15 14   CREATININE 1.36* 1.13 1.04 0.72 0.82 0.90  CALCIUM 10.5* 9.2 8.6* 8.4* 8.3* 8.2*  MG 1.5*  --   --   --   --   --    Liver Function Tests:  Recent Labs Lab 10/08/15 0524 10/09/15 0552 10/10/15 0552 10/11/15 0844 10/12/15 1001  AST 158* 56* 32 59* 78*  ALT 296* 167* 112* 103* 122*  ALKPHOS 95 76 76 97 108  BILITOT 2.6* 2.1* 1.9* 3.2* 3.2*  PROT 6.2* 6.1* 6.2* 6.0* 5.7*  ALBUMIN 3.0* 2.7* 2.5* 2.4* 2.2*    Recent Labs Lab 10/07/15 0330 10/08/15 0524 10/09/15 0552 10/10/15 0552 10/11/15 0844  LIPASE 1609* 359* 77* 34 39   CBC:  Recent Labs Lab 10/07/15 0330 10/08/15 0524 10/10/15 1321 10/11/15 0844 10/12/15 1001  WBC 9.5 17.1* 12.0* 10.8* 10.2  NEUTROABS  --   --  10.2*  --   --   HGB 15.6 14.1 12.3* 12.1* 11.7*  HCT 42.9 40.6 35.2* 34.8* 33.0*  MCV 90.7 93.5 91.4 91.3 90.7  PLT 317 268 206 210 198    CBG:  Recent Labs Lab 10/12/15 1647 10/12/15 2043 10/13/15 0009 10/13/15 0429 10/13/15 0741  GLUCAP 138* 158* 154* 139*  122*    Recent Results (from the past 240 hour(s))  MRSA PCR Screening     Status: None   Collection Time: 10/07/15  6:40 AM  Result Value Ref Range Status   MRSA by PCR NEGATIVE NEGATIVE Final    Comment:        The GeneXpert MRSA Assay (FDA approved for NASAL specimens only), is one component of a comprehensive MRSA colonization surveillance program. It is not intended to diagnose MRSA infection nor to guide or monitor treatment for MRSA infections.      Studies: Dg Abd 1 View  10/12/2015  CLINICAL DATA:  Dobbhoff tube -------to see if in jejumum EXAM: ABDOMEN - 1 VIEW COMPARISON:  the previous day's study FINDINGS: Feeding tube tip remains the gastric body. A few gas distended small bowel loops in the mid abdomen.  Residual contrast in the nondilated colon, rectum and appendix. Right pelvic vascular calcifications. Regional bones unremarkable. IMPRESSION: 1. Feeding tube in the gastric body. 2. Nonobstructive bowel gas pattern with residual colonic contrast material. Electronically Signed   By: Lucrezia Europe M.D.   On: 10/12/2015 09:29   Ct Abdomen Pelvis W Contrast  10/11/2015  CLINICAL DATA:  Upper abdominal pain. Recently diagnosed with acute pancreatitis. Elevated liver function tests. Obstructive cholelithiasis. Prostate cancer. EXAM: CT ABDOMEN AND PELVIS WITH CONTRAST TECHNIQUE: Multidetector CT imaging of the abdomen and pelvis was performed using the standard protocol following bolus administration of intravenous contrast. CONTRAST:  142mL OMNIPAQUE IOHEXOL 300 MG/ML  SOLN COMPARISON:  10/07/2015 right upper quadrant abdominal sonogram and CT abdomen/pelvis. FINDINGS: Lower chest: New small bilateral layering pleural effusions with segmental passive atelectasis in both lower lobes. There is atherosclerosis of the coronary arteries, including calcified atherosclerotic plaque in the left main, left circumflex and right coronary arteries. Oral contrast is present in the lower thoracic esophageal lumen, in keeping with esophageal dysmotility and/ gastroesophageal reflux. Hepatobiliary: Normal liver with no liver mass. The nondistended gallbladder contains several calcified gallstones measuring up to 2.0 cm. There is stable mild nonspecific diffuse gallbladder wall thickening without significant pericholecystic fat stranding. No intrahepatic biliary ductal dilatation. Common bile duct measures 6 mm diameter, within normal limits. No radiopaque choledocholithiasis. Pancreas: There is worsening thickening of the pancreatic head and neck with worsening peripancreatic fat stranding and ill-defined fluid. There is nonenhancement of much of the pancreatic neck. These findings are in keeping with worsening necrotizing acute  pancreatitis. There is a new 6.1 x 2.5 cm peripancreatic fluid collection between the distal stomach and pancreatic head (series 2/image 34). There is a new small 2.6 x 1.8 cm peripancreatic fluid collection along the proximal greater curvature of the stomach anterior to the pancreatic tail (2/25). There is new minimal main pancreatic duct dilation in the pancreatic body. No discrete pancreatic mass is detected. Spleen: Normal size. No mass. Adrenals/Urinary Tract: Normal adrenals. Stable subcentimeter hypodense lesion in the lateral upper right kidney, too small to characterize. Stable subcentimeter hypodense lesion in the interpolar left kidney, too small to characterize. Otherwise normal kidneys, with no hydronephrosis. Collapsed and grossly normal bladder. Stomach/Bowel: Stable small hiatal hernia. Otherwise collapsed and grossly normal stomach. Normal caliber small bowel with no small bowel wall thickening. Re- demonstrated is a small umbilical hernia containing a small bowel loop, with no small bowel wall thickening, pneumatosis or focal small bowel caliber transition. Ascites fluid is also present within the umbilical hernia. Normal appendix. There is moderate distal colonic diverticulosis, most prominent in the sigmoid colon. No large bowel  wall thickening or pericolonic fat stranding. Oral contrast traverses to the distal rectum. Vascular/Lymphatic: Atherosclerotic nonaneurysmal abdominal aorta. There is interval development of acute thrombosis of the splenic vein along the entire length of the pancreas, with patency of the splenic vein at the splenic hilum. The superior mesenteric vein and portal veins remain patent. The hepatic and renal veins remain patent. No pathologically enlarged lymph nodes in the abdomen or pelvis. Reproductive: Mild prostatomegaly. 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 Other: No pneumoperitoneum. Increased small  volume ascites in the pericolic gutters and pelvis. Musculoskeletal: No aggressive appearing focal osseous lesions. Moderate degenerative changes in the visualized thoracolumbar spine. Stable small fat containing right inguinal hernia. IMPRESSION: 1. Worsening acute necrotizing pancreatitis, with nonenhancement of much of the pancreatic neck. 2. New complication of splenic vein thrombosis. Portal veins remain patent. 3. New complication of peripancreatic fluid collections as described, which are at risk of becoming pancreatic pseudocysts. Follow-up CT abdomen/pelvis with intravenous contrast is advised in 1 month. 4. Small volume ascites, increased. 5. New small bilateral pleural effusions. 6. Cholelithiasis. Stable mild nonspecific gallbladder wall thickening, favor chronic cholecystitis given the absence of pericholecystic fat stranding. No biliary ductal dilatation. 7. Additional chronic findings as above, including small umbilical hernia containing a small bowel loop, with no evidence of bowel obstruction or bowel incarceration/ischemia. Electronically Signed   By: Ilona Sorrel M.D.   On: 10/11/2015 13:12   Dg Fluoro Rm 1-60 Min  10/12/2015  CLINICAL DATA:  Pancreatitis, feeding tube positioning beyond ligament of Treitz EXAM: FLOURO RM 1-60 MIN: FEEDING TUBE PLACEMENT CONTRAST:  68mL OMNIPAQUE IOHEXOL 300 MG/ML  SOLN FLUOROSCOPY TIME:  Radiation Exposure Index (as provided by the fluoroscopic device): Not provided If the device does not provide the exposure index: Fluoroscopy Time (in minutes and seconds):  6 minutes 12 seconds Number of Acquired Images:  4 screen captures during fluoroscopy COMPARISON:  None FINDINGS: Patient's indwelling feeding tube was manipulated under fluoroscopy. A 0.038 guidewire was placed through the tube. The tip was located within the stomach initially. Under fluoroscopic guidance, the tip was manipulated to the distal gastric antrum, across the pylorus, through the duodenum and  placed beyond ligament of Treitz. Injection of 20 cc of Omnipaque 300 confirms positioning within proximal jejunum. Procedure tolerated very well by patient. IMPRESSION: Positioning of the feeding tube beyond the ligament of Treitz in the proximal jejunum. Electronically Signed   By: Lavonia Dana M.D.   On: 10/12/2015 11:23   Dg Abd Portable 1v  10/11/2015  CLINICAL DATA:  Status post feeding catheter placement EXAM: PORTABLE ABDOMEN - 1 VIEW COMPARISON:  10/11/2015 1608 hours FINDINGS: Scattered large and small bowel gas is noted. Contrast material is noted throughout the colon from the recent CT examination. A feeding catheter is again seen in the mid stomach and unchanged. No other focal abnormality is noted. IMPRESSION: Stable appearance of feeding catheter when compared with the previous exam Electronically Signed   By: Inez Catalina M.D.   On: 10/11/2015 18:28   Dg Abd Portable 1v  10/11/2015  CLINICAL DATA:  Nasogastric tube placement. Right upper quadrant abdominal pain. EXAM: PORTABLE ABDOMEN - 1 VIEW COMPARISON:  CT same day FINDINGS: Soft feeding tube enters the stomach and has its tip in the region of the antrum. Previously administered oral contrast present within the colon. Previously administered intravenous contrast is noted in the urinary tract. IMPRESSION: Soft feeding tube tip in the gastric antrum. Electronically  Signed   By: Nelson Chimes M.D.   On: 10/11/2015 16:28    Scheduled Meds: . amLODipine  10 mg Oral Daily  . benazepril  40 mg Oral Daily  . heparin subcutaneous  5,000 Units Subcutaneous 3 times per day  .  HYDROmorphone (DILAUDID) injection  1 mg Intravenous 3 times per day  . insulin aspart  0-9 Units Subcutaneous 6 times per day  . metoprolol tartrate  50 mg Oral BID  . pantoprazole (PROTONIX) IV  40 mg Intravenous Q12H   Continuous Infusions: . dextrose 5 % and 0.9 % NaCl with KCl 20 mEq/L 75 mL/hr at 10/13/15 0815  . feeding supplement (OSMOLITE 1.5 CAL) Stopped  (10/13/15 0251)    Active Problems:   HTN (hypertension)   HLD (hyperlipidemia)   Diabetes (HCC)   Gout   Prostate cancer (HCC)   Pancreatitis   Obstructive jaundice   Elevated transaminase level   Hypercalcemia   Hypokalemia   Acute pancreatitis   Elevated liver function tests   Elevated bilirubin   Abdominal pain   Pancreatitis, acute   Encounter for feeding tube placement    Time spent: 20 minutes   Faaris Arizpe. MD  [facility] Pager [phone]. If 7PM-7AM, please contact night-coverage at [email or web address], password [facility] 10/13/2015, 8:49 AM  LOS: 6 days     By signing my name below, I, Rosalie Doctor, attest that this documentation has been prepared under the direction and in the presence of [facility]. MD Electronically Signed: Rosalie Doctor, Scribe. 10/13/2015 1:21pm  I, Dr. Kathie Dike, personally performed the services described in this documentaiton. All medical record entries made by the scribe were at my direction and in my presence. I have reviewed the chart and agree that the record reflects my personal performance and is accurate and complete  Kathie Dike, MD, 10/13/2015 7:02 PM

## 2015-10-13 NOTE — Progress Notes (Signed)
PARENTERAL NUTRITION CONSULT NOTE - INITIAL  Pharmacy Consult for TPN Indication: Pulled ou NJ tube, unable to advance to oral intake  Allergies  Allergen Reactions  . Allopurinol Other (See Comments)    Significantly decreased WBC's  . Colchicine Other (See Comments)    Significantly decreased WBC's   Patient Measurements: Height: 5\' 5"  (165.1 cm) Weight: 169 lb 5 oz (76.8 kg) IBW/kg (Calculated) : 61.5 Adjusted Body Weight: 66.5 Kg  Vital Signs: Temp: 98.5 F (36.9 C) (11/02 1000) Temp Source: Oral (11/02 1000) BP: 177/68 mmHg (11/02 1000) Pulse Rate: 70 (11/02 1000) Intake/Output from previous day: 11/01 0701 - 11/02 0700 In: 1738 [I.V.:1195; NG/GT:543] Out: 250 [Urine:250] Intake/Output from this shift:    Labs:  Recent Labs  10/10/15 1321 10/11/15 0844 10/12/15 1001  WBC 12.0* 10.8* 10.2  HGB 12.3* 12.1* 11.7*  HCT 35.2* 34.8* 33.0*  PLT 206 210 198    Recent Labs  10/10/15 1321 10/11/15 0844 10/12/15 1001  NA 136 139 141  K 3.4* 3.2* 3.3*  CL 108 110 112*  CO2 22 23 22   GLUCOSE 147* 141* 121*  BUN 14 15 14   CREATININE 0.72 0.82 0.90  CALCIUM 8.4* 8.3* 8.2*  PROT  --  6.0* 5.7*  ALBUMIN  --  2.4* 2.2*  AST  --  59* 78*  ALT  --  103* 122*  ALKPHOS  --  97 108  BILITOT  --  3.2* 3.2*  BILIDIR  --  1.1*  --   IBILI  --  2.1*  --    Estimated Creatinine Clearance: 70.9 mL/min (by C-G formula based on Cr of 0.9).    Recent Labs  10/13/15 0429 10/13/15 0741 10/13/15 1129  GLUCAP 139* 122* 116*   Medical History: Past Medical History  Diagnosis Date  . Hypertension   . Heart murmur   . ED (erectile dysfunction)   . Agranulocytosis ([place])   . Diabetes mellitus   . Hyperlipidemia   . Gout   . Adenomatous colon polyp   . Prostate cancer ([place])     Followed by Dr. Jeffie Pollock    Medications:  Prescriptions prior to admission  Medication Sig Dispense Refill Last Dose  . amLODipine (NORVASC) 10 MG tablet Take 1 tablet (10 mg total) by  mouth every evening. 90 tablet 2 10/06/2015 at Unknown time  . aspirin 81 MG tablet Take 81 mg by mouth every evening.    Past Week at Unknown time  . benazepril (LOTENSIN) 40 MG tablet TAKE ONE TABLET BY MOUTH ONCE DAILY 90 tablet 2 Past Week at Unknown time  . febuxostat (ULORIC) 40 MG tablet Take 2 tablets (80 mg total) by mouth daily. 60 tablet 3 Past Week at Unknown time  . finasteride (PROSCAR) 5 MG tablet Take 1 tablet (5 mg total) by mouth daily. 90 tablet 0 Past Week at Unknown time  . furosemide (LASIX) 40 MG tablet Take one and half po qd 135 tablet 2 Past Week at Unknown time  . metFORMIN (GLUCOPHAGE) 500 MG tablet Take 1 tablet (500 mg total) by mouth 2 (two) times daily with a meal. 180 tablet 2 Past Week at Unknown time  . metoprolol succinate (TOPROL-XL) 100 MG 24 hr tablet Take 1 tablet (100 mg total) by mouth 1 day or 1 dose. 90 tablet 2 Past Week at 2200  . Multiple Vitamins-Minerals (MULTIVITAMIN PO) Take 1 tablet by mouth daily.   Past Week at Unknown time  . niacin (NIASPAN) 1000 MG CR  tablet Take 1 tablet (1,000 mg total) by mouth at bedtime. 30 tablet 3 Past Week at Unknown time  . pravastatin (PRAVACHOL) 40 MG tablet Take 1 tablet (40 mg total) by mouth every evening. 90 tablet 2 Past Week at Unknown time   Insulin Requirements in the past 24 hours:  7 units  Current Nutrition:  Tube feeding, NJ accidentally pulled out, begin TPN until enteral feeds can be restarted.  Assessment: 74yo male briefly started on enteral feeding 10-12-15. Pt denies problems with tolerance but says he accidently pulled NJ out going to the bathroom. He reports abdominal pain better today. He remains unable to advance to oral intake and PICC has been ordered to start TPN.  Recommend consider resuming enteral feeding as preferred vs TPN due to maintainance of gut integrity, to attenuate oxidative stress and reduce pt complications such as infections, need for surgical interventions and increased  length of stay.   Dietician note reviewed.  Nutritional Goals:  1860-2170 kCal  98-114 grams of protein per day  Plan:  Begin Clinimix E 5/15 today at 53ml/hr Adjust maintenance IVF down by equivalent rate Titrate TPN to goal as tolerated Monitor lytes, renal fxn, fluid status and glucose tolerance Continue Sliding Scale Insulin q4h checks  Hart Robinsons A PharmD 10/13/2015,12:13 PM

## 2015-10-13 NOTE — Progress Notes (Signed)
Peripherally Inserted Central Catheter/Midline Placement  The IV Nurse has discussed with the patient and/or persons authorized to consent for the patient, the purpose of this procedure and the potential benefits and risks involved with this procedure.  The benefits include less needle sticks, lab draws from the catheter and patient may be discharged home with the catheter.  Risks include, but not limited to, infection, bleeding, blood clot (thrombus formation), and puncture of an artery; nerve damage and irregular heat beat.  Alternatives to this procedure were also discussed.  PICC/Midline Placement Documentation  PICC / Midline Single Lumen 10/13/15 PICC Right Brachial 40 cm 2 cm (Active)       Roselind Messier 10/13/2015, 3:04 PM

## 2015-10-13 NOTE — Progress Notes (Signed)
Subjective:  Patient still requiring frequent pain medication but it is controlling his pain. No N/V. +flatus and BM. Abdomen slightly distended. NJ tube came out early this morning when patient was ambulation to bathroom.   Objective: Vital signs in last 24 hours: Temp:  [97.4 F (36.3 C)-99.5 F (37.5 C)] 98 F (36.7 C) (11/02 0538) Pulse Rate:  [67-90] 84 (11/02 0538) Resp:  [18-24] 20 (11/02 0538) BP: (153-189)/(63-73) 163/67 mmHg (11/02 0538) SpO2:  [95 %-97 %] 96 % (11/02 0538) Weight:  [169 lb 5 oz (76.8 kg)] 169 lb 5 oz (76.8 kg) (11/02 0530) Last BM Date: 10/12/15 General:   Alert,  Well-developed, well-nourished, pleasant and cooperative in NAD. Wife at bedside.  Head:  Normocephalic and atraumatic. Eyes:  Sclera clear, no icterus.  Abdomen:  Soft, slightly distended. Mild epigastric tenderness. Normal bowel sounds, without guarding, and without rebound.   Extremities:  Without clubbing, deformity or edema. Neurologic:  Alert and  oriented x4;  grossly normal neurologically. Skin:  Intact without significant lesions or rashes. Psych:  Alert and cooperative. Normal mood and affect.  Intake/Output from previous day: 11/01 0701 - 11/02 0700 In: 1738 [I.V.:1195; NG/GT:543] Out: 250 [Urine:250] Intake/Output this shift:    Lab Results: CBC  Recent Labs  10/10/15 1321 10/11/15 0844 10/12/15 1001  WBC 12.0* 10.8* 10.2  HGB 12.3* 12.1* 11.7*  HCT 35.2* 34.8* 33.0*  MCV 91.4 91.3 90.7  PLT 206 210 198   BMET  Recent Labs  10/10/15 1321 10/11/15 0844 10/12/15 1001  NA 136 139 141  K 3.4* 3.2* 3.3*  CL 108 110 112*  CO2 22 23 22   GLUCOSE 147* 141* 121*  BUN 14 15 14   CREATININE 0.72 0.82 0.90  CALCIUM 8.4* 8.3* 8.2*   LFTs  Recent Labs  10/11/15 0844 10/12/15 1001  BILITOT 3.2* 3.2*  BILIDIR 1.1*  --   IBILI 2.1*  --   ALKPHOS 97 108  AST 59* 78*  ALT 103* 122*  PROT 6.0* 5.7*  ALBUMIN 2.4* 2.2*    Recent Labs  10/11/15 0844  LIPASE 39    PT/INR No results for input(s): LABPROT, INR in the last 72 hours.    Imaging Studies: Dg Abd 1 View  10/12/2015  CLINICAL DATA:  Dobbhoff tube -------to see if in jejumum EXAM: ABDOMEN - 1 VIEW COMPARISON:  the previous day's study FINDINGS: Feeding tube tip remains the gastric body. A few gas distended small bowel loops in the mid abdomen. Residual contrast in the nondilated colon, rectum and appendix. Right pelvic vascular calcifications. Regional bones unremarkable. IMPRESSION: 1. Feeding tube in the gastric body. 2. Nonobstructive bowel gas pattern with residual colonic contrast material. Electronically Signed   By: Lucrezia Europe M.D.   On: 10/12/2015 09:29   US Abdomen Complete  10/07/2015  CLINICAL DATA:  Obstructive jaundice. EXAM: ULTRASOUND ABDOMEN COMPLETE COMPARISON:  Abdomen CT dated 10/07/2015. FINDINGS: Gallbladder: Multiple gallstones in the gallbladder measuring up to 1.7 cm in diameter each. Diffuse gallbladder wall thickening with a maximum thickness of 9 mm. No pericholecystic fluid. The patient was not focally tender over the gallbladder. Common bile duct: Diameter: 9.2 mm Liver: No focal lesion identified. Within normal limits in parenchymal echogenicity. IVC: No abnormality visualized. Pancreas: Poorly visualized. Ill-defined decreased echogenicity in the pancreatic head, similar to the recent CT. Spleen: Size and appearance within normal limits. Right Kidney: Length: 10.4 cm. Echogenicity within normal limits. No mass or hydronephrosis visualized. Left Kidney: Length: 12.4 cm. Echogenicity within  normal limits. No mass or hydronephrosis visualized. Abdominal aorta: No aneurysm visualized. Other findings: None. IMPRESSION: 1. Findings compatible with acute pancreatitis involving the pancreatic head. 2. Cholelithiasis. 3. 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 4. Biliary ductal dilatation, possibly due to a nonvisualized distal common duct stone  or stones. This could also be related to the acute pancreatitis. Electronically Signed   By: Claudie Revering M.D.   On: 10/07/2015 15:25   Ct Abdomen Pelvis W Contrast  10/11/2015  CLINICAL DATA:  Upper abdominal pain. Recently diagnosed with acute pancreatitis. Elevated liver function tests. Obstructive cholelithiasis. Prostate cancer. EXAM: CT ABDOMEN AND PELVIS WITH CONTRAST TECHNIQUE: Multidetector CT imaging of the abdomen and pelvis was performed using the standard protocol following bolus administration of intravenous contrast. CONTRAST:  154mL OMNIPAQUE IOHEXOL 300 MG/ML  SOLN COMPARISON:  10/07/2015 right upper quadrant abdominal sonogram and CT abdomen/pelvis. FINDINGS: Lower chest: New small bilateral layering pleural effusions with segmental passive atelectasis in both lower lobes. There is atherosclerosis of the coronary arteries, including calcified atherosclerotic plaque in the left main, left circumflex and right coronary arteries. Oral contrast is present in the lower thoracic esophageal lumen, in keeping with esophageal dysmotility and/ gastroesophageal reflux. Hepatobiliary: Normal liver with no liver mass. The nondistended gallbladder contains several calcified gallstones measuring up to 2.0 cm. There is stable mild nonspecific diffuse gallbladder wall thickening without significant pericholecystic fat stranding. No intrahepatic biliary ductal dilatation. Common bile duct measures 6 mm diameter, within normal limits. No radiopaque choledocholithiasis. Pancreas: There is worsening thickening of the pancreatic head and neck with worsening peripancreatic fat stranding and ill-defined fluid. There is nonenhancement of much of the pancreatic neck. These findings are in keeping with worsening necrotizing acute pancreatitis. There is a new 6.1 x 2.5 cm peripancreatic fluid collection between the distal stomach and pancreatic head (series 2/image 34). There is a new small 2.6 x 1.8 cm peripancreatic  fluid collection along the proximal greater curvature of the stomach anterior to the pancreatic tail (2/25). There is new minimal main pancreatic duct dilation in the pancreatic body. No discrete pancreatic mass is detected. Spleen: Normal size. No mass. Adrenals/Urinary Tract: Normal adrenals. Stable subcentimeter hypodense lesion in the lateral upper right kidney, too small to characterize. Stable subcentimeter hypodense lesion in the interpolar left kidney, too small to characterize. Otherwise normal kidneys, with no hydronephrosis. Collapsed and grossly normal bladder. Stomach/Bowel: Stable small hiatal hernia. Otherwise collapsed and grossly normal stomach. Normal caliber small bowel with no small bowel wall thickening. Re- demonstrated is a small umbilical hernia containing a small bowel loop, with no small bowel wall thickening, pneumatosis or focal small bowel caliber transition. Ascites fluid is also present within the umbilical hernia. Normal appendix. There is moderate distal colonic diverticulosis, most prominent in the sigmoid colon. No large bowel wall thickening or pericolonic fat stranding. Oral contrast traverses to the distal rectum. Vascular/Lymphatic: Atherosclerotic nonaneurysmal abdominal aorta. There is interval development of acute thrombosis of the splenic vein along the entire length of the pancreas, with patency of the splenic vein at the splenic hilum. The superior mesenteric vein and portal veins remain patent. The hepatic and renal veins remain patent. No pathologically enlarged lymph nodes in the abdomen or pelvis. Reproductive: Mild prostatomegaly. 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 Other: No pneumoperitoneum. Increased small volume ascites in the pericolic gutters and pelvis. Musculoskeletal: No aggressive appearing focal osseous lesions. Moderate degenerative changes in the visualized  thoracolumbar spine. Stable  small fat containing right inguinal hernia. IMPRESSION: 1. Worsening acute necrotizing pancreatitis, with nonenhancement of much of the pancreatic neck. 2. New complication of splenic vein thrombosis. Portal veins remain patent. 3. New complication of peripancreatic fluid collections as described, which are at risk of becoming pancreatic pseudocysts. Follow-up CT abdomen/pelvis with intravenous contrast is advised in 1 month. 4. Small volume ascites, increased. 5. New small bilateral pleural effusions. 6. Cholelithiasis. Stable mild nonspecific gallbladder wall thickening, favor chronic cholecystitis given the absence of pericholecystic fat stranding. No biliary ductal dilatation. 7. Additional chronic findings as above, including small umbilical hernia containing a small bowel loop, with no evidence of bowel obstruction or bowel incarceration/ischemia. Electronically Signed   By: Ilona Sorrel M.D.   On: 10/11/2015 13:12   Ct Abdomen Pelvis W Contrast  10/07/2015  CLINICAL DATA:  Upper abdominal pain, vomiting and diarrhea after dinner last night. History of diabetes, hyperlipidemia, hypertension and prostate cancer. EXAM: CT ABDOMEN AND PELVIS WITH CONTRAST TECHNIQUE: Multidetector CT imaging of the abdomen and pelvis was performed using the standard protocol following bolus administration of intravenous contrast. CONTRAST:  8mL OMNIPAQUE IOHEXOL 300 MG/ML SOLN, 149mL OMNIPAQUE IOHEXOL 300 MG/ML SOLN COMPARISON:  CT abdomen and pelvis January 16, 2007 FINDINGS: LUNG BASES: Included view of the lung bases are clear. The heart is mildly enlarged, no pericardial effusions. Contrast in the distal esophagus associated with small hiatal hernia most consistent with reflux. SOLID ORGANS: At least 2 gallstones measure up to 17 mm with gallbladder wall thickening and pericholecystic inflammation. Patchy hypo enhancement of the pancreatic head, with extensive peripancreatic inflammation, no pseudocyst, pancreatic duct  dilatation, pancreatic calcifications or mass. The liver, spleen, and adrenal glands are unremarkable. GASTROINTESTINAL TRACT: The stomach, small and large bowel are normal in course and caliber without inflammatory changes. Wide necked 3 cm umbilical hernia containing a knuckle of small bowel without inflammatory changes or dilatation of the bowel. 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 Normal appendix. KIDNEYS/ URINARY TRACT: Kidneys are orthotopic, demonstrating symmetric enhancement. No nephrolithiasis, hydronephrosis or solid renal masses. The unopacified ureters are normal in course and caliber. Delayed imaging through the kidneys demonstrates symmetric prompt contrast excretion within the proximal urinary collecting system. Urinary bladder is partially distended and unremarkable. PERITONEUM/RETROPERITONEUM: Moderate amount of free fluid in the retroperitoneum, extending into the mesentery, tracking into the pelvis. Aortoiliac vessels are normal in course and caliber, moderate calcific atherosclerosis. No lymphadenopathy by CT size criteria. Mild prostatomegaly. SOFT TISSUE/OSSEOUS STRUCTURES: Non-suspicious. Small fat containing umbilical hernias. Bridging osteophytes the sacroiliac joints. Degenerative change of lumbar spine resulting in moderate to severe L5-S1 neural foraminal narrowing. IMPRESSION: Acute pancreatitis, hypo enhancing pancreatic head compatible with focal necrotizing pancreatitis. Moderate amount of retroperitoneal free fluid, no abscess. Cholelithiasis with CT findings of cholecystitis likely reactive from acute pancreatitis. Wide necked 3 cm umbilical hernia containing small bowel without CT findings of incarceration or strangulation, no bowel obstruction . Electronically Signed   By: Elon Alas M.D.   On: 10/07/2015 05:05   Dg Fluoro Rm 1-60 Min  10/12/2015  CLINICAL DATA:  Pancreatitis, feeding tube positioning beyond ligament of Treitz EXAM: FLOURO RM  1-60 MIN: FEEDING TUBE PLACEMENT CONTRAST:  29mL OMNIPAQUE IOHEXOL 300 MG/ML  SOLN FLUOROSCOPY TIME:  Radiation Exposure Index (as provided by the fluoroscopic device): Not provided If the device does not provide the exposure index: Fluoroscopy Time (in minutes and seconds):  6 minutes 12 seconds Number of Acquired Images:  4 screen  captures during fluoroscopy COMPARISON:  None FINDINGS: Patient's indwelling feeding tube was manipulated under fluoroscopy. A 0.038 guidewire was placed through the tube. The tip was located within the stomach initially. Under fluoroscopic guidance, the tip was manipulated to the distal gastric antrum, across the pylorus, through the duodenum and placed beyond ligament of Treitz. Injection of 20 cc of Omnipaque 300 confirms positioning within proximal jejunum. Procedure tolerated very well by patient. IMPRESSION: Positioning of the feeding tube beyond the ligament of Treitz in the proximal jejunum. Electronically Signed   By: Lavonia Dana M.D.   On: 10/12/2015 11:23   Dg Abd Portable 1v  10/11/2015  CLINICAL DATA:  Status post feeding catheter placement EXAM: PORTABLE ABDOMEN - 1 VIEW COMPARISON:  10/11/2015 1608 hours FINDINGS: Scattered large and small bowel gas is noted. Contrast material is noted throughout the colon from the recent CT examination. A feeding catheter is again seen in the mid stomach and unchanged. No other focal abnormality is noted. IMPRESSION: Stable appearance of feeding catheter when compared with the previous exam Electronically Signed   By: Inez Catalina M.D.   On: 10/11/2015 18:28   Dg Abd Portable 1v  10/11/2015  CLINICAL DATA:  Nasogastric tube placement. Right upper quadrant abdominal pain. EXAM: PORTABLE ABDOMEN - 1 VIEW COMPARISON:  CT same day FINDINGS: Soft feeding tube enters the stomach and has its tip in the region of the antrum. Previously administered oral contrast present within the colon. Previously administered intravenous contrast is  noted in the urinary tract. IMPRESSION: Soft feeding tube tip in the gastric antrum. Electronically Signed   By: Nelson Chimes M.D.   On: 10/11/2015 16:28  [2 weeks]   Assessment: 74 year old male with acute, complicated pancreatitis with recent CT noting worsening of pancreatic/peripancreatic inflammation and necrotic segment between the body and the head. New splenic artery thrombosis. No need for antibiotics unless worsening clinical status to include fever, leukocytosis, etc. Nasojejunal tube dislodged early this morning, completely out. Patient unable to obtain nutritional orally given severity of his pancreatitis. Discussed with Dr. Oneida Alar, would not recommend attempting to put another nasojejunal tube. TPN recommended.    Elevated LFTs: without evidence of biliary obstruction. Likely bystander reaction.   Plan: 1. PICC line. 2. TPN pharmacy consult.  3. Continue NPO status and pain management.  4. LFTs, CBC in am.  Laureen Ochs. Bernarda Caffey [facility] [phone] 11/2/201610:04 AM     LOS: 6 days

## 2015-10-13 NOTE — Progress Notes (Signed)
Nutrition Follow-up  DOCUMENTATION CODES:   Not applicable  INTERVENTION:  -TPN per pharmacy  -Transition to enteral nutrition if medically feasible to maintain gut integrity  -RD will continue to follow with medical team  NUTRITION DIAGNOSIS:   Inadequate oral intake related to altered GI function as evidenced by NPO status.   GOAL:   Patient will meet greater than or equal to 90% of their needs  MONITOR:   TF tolerance, Weight trends, Labs   ASSESSMENT: Pt briefly started on enteral feeding late yesterday. Pt denies problems with tolerance but says he accidently pulled NJ out going to the bathroom. He reports abdominal pain better today. He remains unable to advance to oral intake and PICC has been ordered  to start TPN.  Recommend consider resuming enteral feeding as preferred vs TPN  due to maintainance of gut integrity, to attenuate oxidative stress and reduce pt complications such as infections, need for surgical interventions and increased length of stay.   Abnormal Labs: glucose 122, potassium 3.3, lipase 39, AST-78, ALT-122  Diet Order:  Diet NPO time specified Except for: Ice Chips  Skin:   intact  Last BM:   11/2-abdomen soft but distended per nursing  Height:   Ht Readings from Last 1 Encounters:  10/07/15 5\' 5"  (1.651 m)    Weight:   Wt Readings from Last 1 Encounters:  10/13/15 169 lb 5 oz (76.8 kg)    Ideal Body Weight:  61.8 kg  BMI:  Body mass index is 28.18 kg/(m^2).  Estimated Nutritional Needs:   Kcal:  1860-2170  Protein:  98-114 gr  Fluid:  1.9-2.2 liters daily  EDUCATION NEEDS:   No education needs identified at this time  Colman Cater MS,RD,CSG,LDN Office: #[phone] Pager: [phone]

## 2015-10-13 NOTE — Progress Notes (Signed)
Called to the room by the NT, NJ tube had pulled out while patient was ambulating to the bathroom, upon entering room, tube still in the nare, but it was obvious the tube was not in the correct place, feeding pump was stopped by the NT prior to my arrival to the room, the tube was removed, tube feeding stopped, no distress noted.

## 2015-10-14 DIAGNOSIS — E0811 Diabetes mellitus due to underlying condition with ketoacidosis with coma: Secondary | ICD-10-CM

## 2015-10-14 LAB — CBC WITH DIFFERENTIAL/PLATELET
BASOS PCT: 0 %
Basophils Absolute: 0 10*3/uL (ref 0.0–0.1)
EOS ABS: 0.1 10*3/uL (ref 0.0–0.7)
Eosinophils Relative: 1 %
HCT: 31.2 % — ABNORMAL LOW (ref 39.0–52.0)
Hemoglobin: 10.7 g/dL — ABNORMAL LOW (ref 13.0–17.0)
Lymphocytes Relative: 10 %
Lymphs Abs: 1 10*3/uL (ref 0.7–4.0)
MCH: 31.3 pg (ref 26.0–34.0)
MCHC: 34.3 g/dL (ref 30.0–36.0)
MCV: 91.2 fL (ref 78.0–100.0)
Monocytes Absolute: 0.9 10*3/uL (ref 0.1–1.0)
Monocytes Relative: 9 %
NEUTROS PCT: 79 %
Neutro Abs: 7.6 10*3/uL (ref 1.7–7.7)
PLATELETS: 256 10*3/uL (ref 150–400)
RBC: 3.42 MIL/uL — AB (ref 4.22–5.81)
RDW: 14.5 % (ref 11.5–15.5)
WBC: 9.7 10*3/uL (ref 4.0–10.5)

## 2015-10-14 LAB — PREALBUMIN: Prealbumin: 5.8 mg/dL — ABNORMAL LOW (ref 18–38)

## 2015-10-14 LAB — MAGNESIUM: Magnesium: 2 mg/dL (ref 1.7–2.4)

## 2015-10-14 LAB — COMPREHENSIVE METABOLIC PANEL
ALT: 91 U/L — AB (ref 17–63)
AST: 50 U/L — AB (ref 15–41)
Albumin: 2.1 g/dL — ABNORMAL LOW (ref 3.5–5.0)
Alkaline Phosphatase: 143 U/L — ABNORMAL HIGH (ref 38–126)
Anion gap: 5 (ref 5–15)
BUN: 12 mg/dL (ref 6–20)
CHLORIDE: 113 mmol/L — AB (ref 101–111)
CO2: 21 mmol/L — AB (ref 22–32)
CREATININE: 0.88 mg/dL (ref 0.61–1.24)
Calcium: 8.2 mg/dL — ABNORMAL LOW (ref 8.9–10.3)
GFR calc non Af Amer: >60 mL/min (ref >60)
Glucose, Bld: 156 mg/dL — ABNORMAL HIGH (ref 65–99)
POTASSIUM: 3.1 mmol/L — AB (ref 3.5–5.1)
SODIUM: 139 mmol/L (ref 135–145)
Total Bilirubin: 2 mg/dL — ABNORMAL HIGH (ref 0.3–1.2)
Total Protein: 5.5 g/dL — ABNORMAL LOW (ref 6.5–8.1)

## 2015-10-14 LAB — GLUCOSE, CAPILLARY
GLUCOSE-CAPILLARY: 115 mg/dL — AB (ref 65–99)
GLUCOSE-CAPILLARY: 120 mg/dL — AB (ref 65–99)
GLUCOSE-CAPILLARY: 139 mg/dL — AB (ref 65–99)
GLUCOSE-CAPILLARY: 158 mg/dL — AB (ref 65–99)
GLUCOSE-CAPILLARY: 160 mg/dL — AB (ref 65–99)
Glucose-Capillary: 101 mg/dL — ABNORMAL HIGH (ref 65–99)
Glucose-Capillary: 158 mg/dL — ABNORMAL HIGH (ref 65–99)

## 2015-10-14 LAB — PROTIME-INR
INR: 1.36 (ref 0.00–1.49)
PROTHROMBIN TIME: 16.9 s — AB (ref 11.6–15.2)

## 2015-10-14 LAB — PHOSPHORUS: PHOSPHORUS: 3.3 mg/dL (ref 2.5–4.6)

## 2015-10-14 MED ORDER — WARFARIN - PHARMACIST DOSING INPATIENT
Status: DC
  Administered 2015-10-18: 16:00:00

## 2015-10-14 MED ORDER — WARFARIN SODIUM 5 MG PO TABS
5.0000 mg | ORAL_TABLET | Freq: Once | ORAL | Status: AC
  Administered 2015-10-14: 5 mg via ORAL
  Filled 2015-10-14: qty 1

## 2015-10-14 MED ORDER — POTASSIUM CHLORIDE 10 MEQ/100ML IV SOLN
10.0000 meq | INTRAVENOUS | Status: AC
  Administered 2015-10-14 (×3): 10 meq via INTRAVENOUS
  Filled 2015-10-14: qty 100

## 2015-10-14 MED ORDER — HEPARIN BOLUS VIA INFUSION
2000.0000 [IU] | Freq: Once | INTRAVENOUS | Status: AC
  Administered 2015-10-14: 2000 [IU] via INTRAVENOUS
  Filled 2015-10-14: qty 2000

## 2015-10-14 MED ORDER — HYDRALAZINE HCL 20 MG/ML IJ SOLN
10.0000 mg | Freq: Four times a day (QID) | INTRAMUSCULAR | Status: DC | PRN
  Administered 2015-10-16: 10 mg via INTRAVENOUS
  Filled 2015-10-14: qty 1

## 2015-10-14 MED ORDER — HEPARIN (PORCINE) IN NACL 100-0.45 UNIT/ML-% IJ SOLN
1700.0000 [IU]/h | INTRAMUSCULAR | Status: DC
  Administered 2015-10-14: 1200 [IU]/h via INTRAVENOUS
  Administered 2015-10-15: 1400 [IU]/h via INTRAVENOUS
  Administered 2015-10-16: 1700 [IU]/h via INTRAVENOUS
  Administered 2015-10-16: 1600 [IU]/h via INTRAVENOUS
  Administered 2015-10-17 – 2015-10-18 (×3): 1700 [IU]/h via INTRAVENOUS
  Filled 2015-10-14 (×7): qty 250

## 2015-10-14 MED ORDER — TRACE MINERALS CR-CU-MN-SE-ZN 10-1000-500-60 MCG/ML IV SOLN
INTRAVENOUS | Status: AC
  Administered 2015-10-14: 18:00:00 via INTRAVENOUS
  Filled 2015-10-14: qty 960

## 2015-10-14 NOTE — Progress Notes (Signed)
Physical Therapy Treatment Patient Details Name: Alan [name]. MRN: [id number] DOB: April 02, 1941 Today's Date: 10/14/2015    History of Present Illness Pt is a 74yo black male who came to [facility] after N/V diahrrea, and RUQ pain at home. Pt found to have acute pancreatitis. At eval, all symptoms are well managed.     PT Comments    Pt semirecumbent in room, stated he was pain free but tired from pain medication given earlier today for abdominal pain.  Pt alert and willing to participate with therapy.  Bed exercises complete for LE strengthening, pt independent with bed mobitliy supine to sit, transfer training with just supervision and verbal cueing for proper hand and foot placement prior standing and stable upon standing with RW.  Increased gait training distance with gait mechanics WNL, no LOB episodes noted during session.  Upon return to room pt left in chair with call bell within reach, chair alarm set and family member in room.   Pt tired but denise any pain during session.    Follow Up Recommendations        Equipment Recommendations       Recommendations for Other Services       Precautions / Restrictions Precautions Precautions: Fall Precaution Comments: elevated BPs Restrictions Weight Bearing Restrictions: No    Mobility  Bed Mobility Overal bed mobility: Independent Bed Mobility: Supine to Sit     Supine to sit: Supervision        Transfers Overall transfer level: Modified independent Equipment used: Rolling walker (2 wheeled) Transfers: Sit to/from Stand Sit to Stand: Supervision         General transfer comment: Verbal cueing for proper hand and foot placement prior standing and sitting  Ambulation/Gait Ambulation/Gait assistance: Supervision Ambulation Distance (Feet): 180 Feet Assistive device: Rolling walker (2 wheeled) Gait Pattern/deviations: WFL(Within Functional Limits)   Gait velocity interpretation: <1.8 ft/sec, indicative of risk for  recurrent falls General Gait Details: gait very stable with a walker and will be of benefit due to general deconditioning.   Stairs            Wheelchair Mobility    Modified Rankin (Stroke Patients Only)       Balance                                    Cognition Arousal/Alertness: Awake/alert Behavior During Therapy: WFL for tasks assessed/performed Overall Cognitive Status: Within Functional Limits for tasks assessed                      Exercises General Exercises - Lower Extremity Ankle Circles/Pumps: AROM;Both;10 reps;Supine Short Arc Quad: AROM;Both;10 reps;Supine Heel Slides: AAROM;Both;10 reps;Supine Hip ABduction/ADduction: AROM;Both;10 reps;Supine    General Comments        Pertinent Vitals/Pain Pain Assessment: No/denies pain (pain injection into abdominal region prior tx.) Pain Score: 0-No pain Pain Intervention(s): Limited activity within patient's tolerance;Monitored during session;Premedicated before session    Home Living                      Prior Function            PT Goals (current goals can now be found in the care plan section) Progress towards PT goals: Progressing toward goals    Frequency       PT Plan Current plan remains appropriate    Co-evaluation  End of Session Equipment Utilized During Treatment: Gait belt Activity Tolerance: Patient tolerated treatment well Patient left: in chair;with call bell/phone within reach;with chair alarm set;with family/visitor present     Time: 3552-1747 PT Time Calculation (min) (ACUTE ONLY): 26 min  Charges:  $Gait Training: 8-22 mins $Therapeutic Exercise: 8-22 mins                    G Codes:      Aldona Lento 10/14/2015, 2:28 PM

## 2015-10-14 NOTE — Progress Notes (Signed)
PARENTERAL NUTRITION CONSULT NOTE - INITIAL  Pharmacy Consult for TPN Indication: Pulled out NJ tube, unable to advance to oral intake / pancreatitis  Allergies  Allergen Reactions  . Allopurinol Other (See Comments)    Significantly decreased WBC's  . Colchicine Other (See Comments)    Significantly decreased WBC's   Patient Measurements: Height: 5\' 5"  (165.1 cm) Weight: 168 lb 12.8 oz (76.567 kg) IBW/kg (Calculated) : 61.5 Adjusted Body Weight: 66.5 Kg  Vital Signs: Temp: 98.8 F (37.1 C) (11/03 1017) Temp Source: Oral (11/03 1017) BP: 190/62 mmHg (11/03 1017) Pulse Rate: 65 (11/03 1017) Intake/Output from previous day: 11/02 0701 - 11/03 0700 In: 852.3 [I.V.:399.6; TPN:452.7] Out: -  Intake/Output from this shift:    Labs:  Recent Labs  10/12/15 1001 10/14/15 0516  WBC 10.2 9.7  HGB 11.7* 10.7*  HCT 33.0* 31.2*  PLT 198 256    Recent Labs  10/12/15 1001 10/14/15 0516  NA 141 139  K 3.3* 3.1*  CL 112* 113*  CO2 22 21*  GLUCOSE 121* 156*  BUN 14 12  CREATININE 0.90 0.88  CALCIUM 8.2* 8.2*  MG  --  2.0  PHOS  --  3.3  PROT 5.7* 5.5*  ALBUMIN 2.2* 2.1*  AST 78* 50*  ALT 122* 91*  ALKPHOS 108 143*  BILITOT 3.2* 2.0*   Estimated Creatinine Clearance: 72.4 mL/min (by C-G formula based on Cr of 0.88).    Recent Labs  10/14/15 0013 10/14/15 0408 10/14/15 0726  GLUCAP 139* 158* 120*   Medical History: Past Medical History  Diagnosis Date  . Hypertension   . Heart murmur   . ED (erectile dysfunction)   . Agranulocytosis ([place])   . Diabetes mellitus   . Hyperlipidemia   . Gout   . Adenomatous colon polyp   . Prostate cancer ([place])     Followed by Dr. Jeffie Pollock    Medications:  Prescriptions prior to admission  Medication Sig Dispense Refill Last Dose  . amLODipine (NORVASC) 10 MG tablet Take 1 tablet (10 mg total) by mouth every evening. 90 tablet 2 10/06/2015 at Unknown time  . aspirin 81 MG tablet Take 81 mg by mouth every evening.     Past Week at Unknown time  . benazepril (LOTENSIN) 40 MG tablet TAKE ONE TABLET BY MOUTH ONCE DAILY 90 tablet 2 Past Week at Unknown time  . febuxostat (ULORIC) 40 MG tablet Take 2 tablets (80 mg total) by mouth daily. 60 tablet 3 Past Week at Unknown time  . finasteride (PROSCAR) 5 MG tablet Take 1 tablet (5 mg total) by mouth daily. 90 tablet 0 Past Week at Unknown time  . furosemide (LASIX) 40 MG tablet Take one and half po qd 135 tablet 2 Past Week at Unknown time  . metFORMIN (GLUCOPHAGE) 500 MG tablet Take 1 tablet (500 mg total) by mouth 2 (two) times daily with a meal. 180 tablet 2 Past Week at Unknown time  . metoprolol succinate (TOPROL-XL) 100 MG 24 hr tablet Take 1 tablet (100 mg total) by mouth 1 day or 1 dose. 90 tablet 2 Past Week at 2200  . Multiple Vitamins-Minerals (MULTIVITAMIN PO) Take 1 tablet by mouth daily.   Past Week at Unknown time  . niacin (NIASPAN) 1000 MG CR tablet Take 1 tablet (1,000 mg total) by mouth at bedtime. 30 tablet 3 Past Week at Unknown time  . pravastatin (PRAVACHOL) 40 MG tablet Take 1 tablet (40 mg total) by mouth every evening. 90 tablet  2 Past Week at Unknown time   Insulin Requirements in the past 24 hours:  ~ 9 units  Current Nutrition:  TPN started 10/13/15.   Assessment: 74yo male briefly started on enteral feeding 10-12-15. Pt denies problems with tolerance but says he accidently pulled NJ out going to the bathroom. He reports abdominal pain better today. He remains unable to advance to oral intake and PICC has been ordered to start TPN.  Recommend consider resuming enteral feeding as preferred vs TPN due to maintainance of gut integrity, to attenuate oxidative stress and reduce pt complications such as infections, need for surgical interventions and increased length of stay.  Dietician note reviewed.  Sugars still elevated, receiving SSI.  Pt c/o being hungry.  Hypokalemia noted.  I/O +852  Nutritional Goals:  1860-2170 kCal  98-114  grams of protein per day  Plan:  Continue Clinimix E 5/15 at 23ml/hr Hold Lipids for now Continue IVF at current rate Replenish K+ with KCL runs x 3 (1meq total) Add regular insulin 10 units per liter to TPN bag today Titrate TPN to goal as tolerated (when sugars controlled / stable) Monitor lytes, renal fxn, fluid status and glucose tolerance Continue Sliding Scale Insulin q4h checks  Hart Robinsons A PharmD 10/14/2015,10:45 AM

## 2015-10-14 NOTE — Progress Notes (Addendum)
[facility] for Heparin --> Warfarin Indication: Splenic Infarct, VTE treatment  Allergies  Allergen Reactions  . Allopurinol Other (See Comments)    Significantly decreased WBC's  . Colchicine Other (See Comments)    Significantly decreased WBC's    Patient Measurements: Height: 5\' 5"  (165.1 cm) Weight: 168 lb 12.8 oz (76.567 kg) IBW/kg (Calculated) : 61.5 HEPARIN DW (KG): 76.6   Vital Signs: Temp: 99 F (37.2 C) (11/03 2055) Temp Source: Oral (11/03 2055) BP: 177/67 mmHg (11/03 2055) Pulse Rate: 64 (11/03 2055)  Labs:  Recent Labs  10/12/15 1001 10/14/15 0516 10/14/15 2010  HGB 11.7* 10.7*  --   HCT 33.0* 31.2*  --   PLT 198 256  --   LABPROT  --   --  16.9*  INR  --   --  1.36  CREATININE 0.90 0.88  --     Estimated Creatinine Clearance: 72.4 mL/min (by C-G formula based on Cr of 0.88).  Medical History: Past Medical History  Diagnosis Date  . Hypertension   . Heart murmur   . ED (erectile dysfunction)   . Agranulocytosis ([place])   . Diabetes mellitus   . Hyperlipidemia   . Gout   . Adenomatous colon polyp   . Prostate cancer ([place])     Followed by Dr. Jeffie Pollock    Medications:  Scheduled:  . amLODipine  10 mg Oral Daily  . benazepril  40 mg Oral Daily  .  HYDROmorphone (DILAUDID) injection  1 mg Intravenous 3 times per day  . insulin aspart  0-15 Units Subcutaneous 6 times per day  . metoprolol tartrate  50 mg Oral BID  . pantoprazole (PROTONIX) IV  40 mg Intravenous Q12H  . sodium chloride  10-40 mL Intracatheter Q12H  . [START ON 10/15/2015] Warfarin - Pharmacist Dosing Inpatient   Does not apply Q24H   Assessment: Okay for Protocol, received Heparin SQ today at 13:30.  CBC ok, slight decrease in H/H today.  PT/INR pending.  OK for systemic AC per GI service.  Acute pancreatitis on admission and cholecystectomy planned eventually but likely not this admission per TR[facility].   Goal of Therapy:  INR 2-3 Heparin level  0.3-0.7 units/ml Monitor platelets by anticoagulation protocol: Yes   Plan:  Give 2000 units bolus x 1 Start heparin infusion at 1200 units/hr Check anti-Xa level in 6-8 hours and daily while on heparin Continue to monitor H&H and platelets  Warfarin 5mg  PO x 1 Daily PT/INR  Pricilla Larsson 10/14/2015,9:03 PM

## 2015-10-14 NOTE — Care Management Note (Signed)
Case Management Note  Patient Details  Name: Alan [name]. MRN: [id number] Date of Birth: [birth year]  Subjective/Objective:                    Action/Plan:   Expected Discharge Date:                  Expected Discharge Plan:  [place]  In-House Referral:  NA  Discharge planning Services  CM Consult  Post Acute Care Choice:  Home Health Choice offered to:  Patient  DME Arranged:    DME Agency:     HH Arranged:  PT [place]:  [facility]  Status of Service:  Completed, signed off  Medicare Important Message Given:  Yes-second notification given Date Medicare IM Given:    Medicare IM give by:    Date Additional Medicare IM Given:    Additional Medicare Important Message give by:     If discussed at [place] of Stay Meetings, dates discussed:  10/14/2015  Additional Comments:  Sherald Barge, RN 10/14/2015, 4:12 PM

## 2015-10-14 NOTE — Progress Notes (Signed)
Subjective:  Feels better. abd less distended. Pain improved, still using dilaudid. Feels hungry.  Objective: Vital signs in last 24 hours: Temp:  [97.5 F (36.4 C)-98.7 F (37.1 C)] 98.1 F (36.7 C) (11/03 0625) Pulse Rate:  [57-93] 63 (11/03 0625) Resp:  [16-19] 18 (11/03 0625) BP: (156-189)/(63-73) 189/67 mmHg (11/03 0625) SpO2:  [94 %-97 %] 97 % (11/03 0625) Weight:  [168 lb 12.8 oz (76.567 kg)] 168 lb 12.8 oz (76.567 kg) (11/03 0625) Last BM Date: 10/13/15 General:   Alert,  Well-developed, well-nourished, pleasant and cooperative in NAD Head:  Normocephalic and atraumatic. Eyes:  Sclera clear, no icterus.   Abdomen:  Soft, nontender and less distended. Normal bowel sounds, without guarding, and without rebound.   Extremities:  Without clubbing, deformity or edema. Neurologic:  Alert and  oriented x4;  grossly normal neurologically. Skin:  Intact without significant lesions or rashes. Psych:  Alert and cooperative. Normal mood and affect.  Intake/Output from previous day: 11/02 0701 - 11/03 0700 In: 852.3 [I.V.:399.6; TPN:452.7] Out: -  Intake/Output this shift:    Lab Results: CBC  Recent Labs  10/11/15 0844 10/12/15 1001 10/14/15 0516  WBC 10.8* 10.2 9.7  HGB 12.1* 11.7* 10.7*  HCT 34.8* 33.0* 31.2*  MCV 91.3 90.7 91.2  PLT 210 198 256   BMET  Recent Labs  10/11/15 0844 10/12/15 1001 10/14/15 0516  NA 139 141 139  K 3.2* 3.3* 3.1*  CL 110 112* 113*  CO2 23 22 21*  GLUCOSE 141* 121* 156*  BUN 15 14 12   CREATININE 0.82 0.90 0.88  CALCIUM 8.3* 8.2* 8.2*   LFTs  Recent Labs  10/11/15 0844 10/12/15 1001 10/14/15 0516  BILITOT 3.2* 3.2* 2.0*  BILIDIR 1.1*  --   --   IBILI 2.1*  --   --   ALKPHOS 97 108 143*  AST 59* 78* 50*  ALT 103* 122* 91*  PROT 6.0* 5.7* 5.5*  ALBUMIN 2.4* 2.2* 2.1*    Recent Labs  10/11/15 0844  LIPASE 39   PT/INR No results for input(s): LABPROT, INR in the last 72 hours.    Imaging Studies: Dg Abd 1  View  10/12/2015  CLINICAL DATA:  Dobbhoff tube -------to see if in jejumum EXAM: ABDOMEN - 1 VIEW COMPARISON:  the previous day's study FINDINGS: Feeding tube tip remains the gastric body. A few gas distended small bowel loops in the mid abdomen. Residual contrast in the nondilated colon, rectum and appendix. Right pelvic vascular calcifications. Regional bones unremarkable. IMPRESSION: 1. Feeding tube in the gastric body. 2. Nonobstructive bowel gas pattern with residual colonic contrast material. Electronically Signed   By: Lucrezia Europe M.D.   On: 10/12/2015 09:29   US Abdomen Complete  10/07/2015  CLINICAL DATA:  Obstructive jaundice. EXAM: ULTRASOUND ABDOMEN COMPLETE COMPARISON:  Abdomen CT dated 10/07/2015. FINDINGS: Gallbladder: Multiple gallstones in the gallbladder measuring up to 1.7 cm in diameter each. Diffuse gallbladder wall thickening with a maximum thickness of 9 mm. No pericholecystic fluid. The patient was not focally tender over the gallbladder. Common bile duct: Diameter: 9.2 mm Liver: No focal lesion identified. Within normal limits in parenchymal echogenicity. IVC: No abnormality visualized. Pancreas: Poorly visualized. Ill-defined decreased echogenicity in the pancreatic head, similar to the recent CT. Spleen: Size and appearance within normal limits. Right Kidney: Length: 10.4 cm. Echogenicity within normal limits. No mass or hydronephrosis visualized. Left Kidney: Length: 12.4 cm. Echogenicity within normal limits. No mass or hydronephrosis visualized. Abdominal aorta: No aneurysm visualized.  Other findings: None. IMPRESSION: 1. Findings compatible with acute pancreatitis involving the pancreatic head. 2. Cholelithiasis. 3. 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 4. Biliary ductal dilatation, possibly due to a nonvisualized distal common duct stone or stones. This could also be related to the acute pancreatitis. Electronically Signed   By: Claudie Revering M.D.   On: 10/07/2015 15:25   Ct Abdomen Pelvis W Contrast  10/11/2015  CLINICAL DATA:  Upper abdominal pain. Recently diagnosed with acute pancreatitis. Elevated liver function tests. Obstructive cholelithiasis. Prostate cancer. EXAM: CT ABDOMEN AND PELVIS WITH CONTRAST TECHNIQUE: Multidetector CT imaging of the abdomen and pelvis was performed using the standard protocol following bolus administration of intravenous contrast. CONTRAST:  167mL OMNIPAQUE IOHEXOL 300 MG/ML  SOLN COMPARISON:  10/07/2015 right upper quadrant abdominal sonogram and CT abdomen/pelvis. FINDINGS: Lower chest: New small bilateral layering pleural effusions with segmental passive atelectasis in both lower lobes. There is atherosclerosis of the coronary arteries, including calcified atherosclerotic plaque in the left main, left circumflex and right coronary arteries. Oral contrast is present in the lower thoracic esophageal lumen, in keeping with esophageal dysmotility and/ gastroesophageal reflux. Hepatobiliary: Normal liver with no liver mass. The nondistended gallbladder contains several calcified gallstones measuring up to 2.0 cm. There is stable mild nonspecific diffuse gallbladder wall thickening without significant pericholecystic fat stranding. No intrahepatic biliary ductal dilatation. Common bile duct measures 6 mm diameter, within normal limits. No radiopaque choledocholithiasis. Pancreas: There is worsening thickening of the pancreatic head and neck with worsening peripancreatic fat stranding and ill-defined fluid. There is nonenhancement of much of the pancreatic neck. These findings are in keeping with worsening necrotizing acute pancreatitis. There is a new 6.1 x 2.5 cm peripancreatic fluid collection between the distal stomach and pancreatic head (series 2/image 34). There is a new small 2.6 x 1.8 cm peripancreatic fluid collection along the proximal greater curvature of the stomach anterior to the pancreatic tail  (2/25). There is new minimal main pancreatic duct dilation in the pancreatic body. No discrete pancreatic mass is detected. Spleen: Normal size. No mass. Adrenals/Urinary Tract: Normal adrenals. Stable subcentimeter hypodense lesion in the lateral upper right kidney, too small to characterize. Stable subcentimeter hypodense lesion in the interpolar left kidney, too small to characterize. Otherwise normal kidneys, with no hydronephrosis. Collapsed and grossly normal bladder. Stomach/Bowel: Stable small hiatal hernia. Otherwise collapsed and grossly normal stomach. Normal caliber small bowel with no small bowel wall thickening. Re- demonstrated is a small umbilical hernia containing a small bowel loop, with no small bowel wall thickening, pneumatosis or focal small bowel caliber transition. Ascites fluid is also present within the umbilical hernia. Normal appendix. There is moderate distal colonic diverticulosis, most prominent in the sigmoid colon. No large bowel wall thickening or pericolonic fat stranding. Oral contrast traverses to the distal rectum. Vascular/Lymphatic: Atherosclerotic nonaneurysmal abdominal aorta. There is interval development of acute thrombosis of the splenic vein along the entire length of the pancreas, with patency of the splenic vein at the splenic hilum. The superior mesenteric vein and portal veins remain patent. The hepatic and renal veins remain patent. No pathologically enlarged lymph nodes in the abdomen or pelvis. Reproductive: Mild prostatomegaly. 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 Other: No pneumoperitoneum. Increased small volume ascites in the pericolic gutters and pelvis. Musculoskeletal: No aggressive appearing focal osseous lesions. Moderate degenerative changes in the visualized thoracolumbar spine. Stable small fat containing right inguinal hernia. IMPRESSION: 1. Worsening  acute necrotizing pancreatitis,  with nonenhancement of much of the pancreatic neck. 2. New complication of splenic vein thrombosis. Portal veins remain patent. 3. New complication of peripancreatic fluid collections as described, which are at risk of becoming pancreatic pseudocysts. Follow-up CT abdomen/pelvis with intravenous contrast is advised in 1 month. 4. Small volume ascites, increased. 5. New small bilateral pleural effusions. 6. Cholelithiasis. Stable mild nonspecific gallbladder wall thickening, favor chronic cholecystitis given the absence of pericholecystic fat stranding. No biliary ductal dilatation. 7. Additional chronic findings as above, including small umbilical hernia containing a small bowel loop, with no evidence of bowel obstruction or bowel incarceration/ischemia. Electronically Signed   By: Ilona Sorrel M.D.   On: 10/11/2015 13:12   Ct Abdomen Pelvis W Contrast  10/07/2015  CLINICAL DATA:  Upper abdominal pain, vomiting and diarrhea after dinner last night. History of diabetes, hyperlipidemia, hypertension and prostate cancer. EXAM: CT ABDOMEN AND PELVIS WITH CONTRAST TECHNIQUE: Multidetector CT imaging of the abdomen and pelvis was performed using the standard protocol following bolus administration of intravenous contrast. CONTRAST:  77mL OMNIPAQUE IOHEXOL 300 MG/ML SOLN, 148mL OMNIPAQUE IOHEXOL 300 MG/ML SOLN COMPARISON:  CT abdomen and pelvis January 16, 2007 FINDINGS: LUNG BASES: Included view of the lung bases are clear. The heart is mildly enlarged, no pericardial effusions. Contrast in the distal esophagus associated with small hiatal hernia most consistent with reflux. SOLID ORGANS: At least 2 gallstones measure up to 17 mm with gallbladder wall thickening and pericholecystic inflammation. Patchy hypo enhancement of the pancreatic head, with extensive peripancreatic inflammation, no pseudocyst, pancreatic duct dilatation, pancreatic calcifications or mass. The liver, spleen, and adrenal glands are unremarkable.  GASTROINTESTINAL TRACT: The stomach, small and large bowel are normal in course and caliber without inflammatory changes. Wide necked 3 cm umbilical hernia containing a knuckle of small bowel without inflammatory changes or dilatation of the bowel. 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 Normal appendix. KIDNEYS/ URINARY TRACT: Kidneys are orthotopic, demonstrating symmetric enhancement. No nephrolithiasis, hydronephrosis or solid renal masses. The unopacified ureters are normal in course and caliber. Delayed imaging through the kidneys demonstrates symmetric prompt contrast excretion within the proximal urinary collecting system. Urinary bladder is partially distended and unremarkable. PERITONEUM/RETROPERITONEUM: Moderate amount of free fluid in the retroperitoneum, extending into the mesentery, tracking into the pelvis. Aortoiliac vessels are normal in course and caliber, moderate calcific atherosclerosis. No lymphadenopathy by CT size criteria. Mild prostatomegaly. SOFT TISSUE/OSSEOUS STRUCTURES: Non-suspicious. Small fat containing umbilical hernias. Bridging osteophytes the sacroiliac joints. Degenerative change of lumbar spine resulting in moderate to severe L5-S1 neural foraminal narrowing. IMPRESSION: Acute pancreatitis, hypo enhancing pancreatic head compatible with focal necrotizing pancreatitis. Moderate amount of retroperitoneal free fluid, no abscess. Cholelithiasis with CT findings of cholecystitis likely reactive from acute pancreatitis. Wide necked 3 cm umbilical hernia containing small bowel without CT findings of incarceration or strangulation, no bowel obstruction . Electronically Signed   By: Elon Alas M.D.   On: 10/07/2015 05:05   Dg Fluoro Rm 1-60 Min  10/12/2015  CLINICAL DATA:  Pancreatitis, feeding tube positioning beyond ligament of Treitz EXAM: FLOURO RM 1-60 MIN: FEEDING TUBE PLACEMENT CONTRAST:  86mL OMNIPAQUE IOHEXOL 300 MG/ML  SOLN FLUOROSCOPY TIME:   Radiation Exposure Index (as provided by the fluoroscopic device): Not provided If the device does not provide the exposure index: Fluoroscopy Time (in minutes and seconds):  6 minutes 12 seconds Number of Acquired Images:  4 screen captures during fluoroscopy COMPARISON:  None FINDINGS: Patient's indwelling feeding tube was  manipulated under fluoroscopy. A 0.038 guidewire was placed through the tube. The tip was located within the stomach initially. Under fluoroscopic guidance, the tip was manipulated to the distal gastric antrum, across the pylorus, through the duodenum and placed beyond ligament of Treitz. Injection of 20 cc of Omnipaque 300 confirms positioning within proximal jejunum. Procedure tolerated very well by patient. IMPRESSION: Positioning of the feeding tube beyond the ligament of Treitz in the proximal jejunum. Electronically Signed   By: Lavonia Dana M.D.   On: 10/12/2015 11:23   Dg Chest Port 1 View  10/13/2015  CLINICAL DATA:  PICC line placement EXAM: PORTABLE CHEST 1 VIEW COMPARISON:  None. FINDINGS: Right upper extremity PICC line extends into the low SVC. There are small effusions and mild basilar opacities bilaterally. Pulmonary vasculature is normal. IMPRESSION: 1. Satisfactorily positioned right upper extremity PICC line. 2. Small effusions. Adjacent basilar opacities may be atelectatic, but infectious infiltrate cannot be excluded. Electronically Signed   By: Andreas Newport M.D.   On: 10/13/2015 22:40   Dg Abd Portable 1v  10/11/2015  CLINICAL DATA:  Status post feeding catheter placement EXAM: PORTABLE ABDOMEN - 1 VIEW COMPARISON:  10/11/2015 1608 hours FINDINGS: Scattered large and small bowel gas is noted. Contrast material is noted throughout the colon from the recent CT examination. A feeding catheter is again seen in the mid stomach and unchanged. No other focal abnormality is noted. IMPRESSION: Stable appearance of feeding catheter when compared with the previous exam  Electronically Signed   By: Inez Catalina M.D.   On: 10/11/2015 18:28   Dg Abd Portable 1v  10/11/2015  CLINICAL DATA:  Nasogastric tube placement. Right upper quadrant abdominal pain. EXAM: PORTABLE ABDOMEN - 1 VIEW COMPARISON:  CT same day FINDINGS: Soft feeding tube enters the stomach and has its tip in the region of the antrum. Previously administered oral contrast present within the colon. Previously administered intravenous contrast is noted in the urinary tract. IMPRESSION: Soft feeding tube tip in the gastric antrum. Electronically Signed   By: Nelson Chimes M.D.   On: 10/11/2015 16:28  [2 weeks]   Assessment: 74 year old male with acute, complicated pancreatitis with recent CT noting worsening of pancreatic/peripancreatic inflammation and necrotic segment between the body and the head. New splenic artery thrombosis. No need for antibiotics unless worsening clinical status to include fever, leukocytosis, etc. Nasojejunal tube dislodged yesterday morning, completely out. Patient unable to obtain nutritional orally given severity of his pancreatitis. Discussed with Dr. Oneida Alar, would not recommend attempting to put another nasojejunal tube. TPN recommended and initiated yesterday. Patient states his abdominal pain is improved. abd less distended. Dilaudid 5mg  yesterday.   Elevated LFTs: without evidence of biliary obstruction. Likely bystander reaction. Improved.   Anemia: likely multifactorial, dilutional. Hgb above 10.5. No evidence of bleeding.   Plan: 1. Continue supportive measures. To discuss attempt for oral feedings with Dr. Gala Romney. ?try sips of clears later today.   Laureen Ochs. Bernarda Caffey [facility] [phone] 11/3/20169:59 AM     LOS: 7 days

## 2015-10-14 NOTE — Progress Notes (Addendum)
[facility] PROGRESS NOTE  Alan [name]. EXH:[id number] DOB: 07-31-1941 DOA: 10/07/2015 PCP: Wardell Honour, MD  Assessment/Plan: 1. 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 Lipase WNL. Patient briefly had [place] tube for nutrition. Unfortunately this was dislodged on 11/2, Subsequently, PICC line was placed for TPN. Consider starting on clear liquid diet today, if ok with GI.  2. 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 Will likely need to be anticoagulated Will need to discuss with GI to ensure there are no GI contraindications.  3. Hypokalemia, will continue to replete.  4. DM type 2, stable. Will continue SSI 5. Essential HTN, blood pressures are running high. Will add prn hydralazine 6. Prostate CA.  Followed by urology in [place].  7. HLD, continue statin.  8. Gout, no evidence of acute flares.  Code Status: Full DVT prophylaxis: SCDs Family Communication:Family at bedside. Discussed with patient who understands and has no concerns at this time. Disposition Plan: Anticipate discharge in 1-2 days.    Consultants:  GI  PT- HH  Procedures:  NJ tube 10/31>>11/2  PICC line 11/2>>  Antibiotics:    HPI/Subjective: Feeling better. Denies any nausea, vomiting, or abdominal pain.   Objective: Filed Vitals:   10/14/15 0625  BP: 189/67  Pulse: 63  Temp: 98.1 F (36.7 C)  Resp: 18    Intake/Output Summary (Last 24 hours) at 10/14/15 0759 Last data filed at 10/14/15 0600  Gross per 24 hour  Intake 852.25 ml  Output      0 ml  Net 852.25 ml   Filed Weights   10/07/15 0648 10/13/15 0530 10/14/15 0625  Weight: 75.8 kg (167 lb 1.7 oz) 76.8 kg (169 lb 5 oz) 76.567 kg (168 lb 12.8 oz)    Exam:  General: NAD. Sitting up in room chair looks calm and comfortable.  Cardiovascular: Regular rhythm and regular rate Respiratory: clear bilaterally, No wheezing, rales or rhonchi Abdomen: soft, non tender, no distention , bowel sounds  normal. Musculoskeletal: No edema b/l  Data Reviewed: Basic Metabolic Panel:  Recent Labs Lab 10/08/15 0524 10/10/15 1321 10/11/15 0844 10/12/15 1001 10/14/15 0516  NA 142 136 139 141 139  K 3.4* 3.4* 3.2* 3.3* 3.1*  CL 108 108 110 112* 113*  CO2 26 22 23 22  21*  GLUCOSE 144* 147* 141* 121* 156*  BUN 14 14 15 14 12   CREATININE 1.04 0.72 0.82 0.90 0.88  CALCIUM 8.6* 8.4* 8.3* 8.2* 8.2*  MG  --   --   --   --  2.0  PHOS  --   --   --   --  3.3   Liver Function Tests:  Recent Labs Lab 10/09/15 0552 10/10/15 0552 10/11/15 0844 10/12/15 1001 10/14/15 0516  AST 56* 32 59* 78* 50*  ALT 167* 112* 103* 122* 91*  ALKPHOS 76 76 97 108 143*  BILITOT 2.1* 1.9* 3.2* 3.2* 2.0*  PROT 6.1* 6.2* 6.0* 5.7* 5.5*  ALBUMIN 2.7* 2.5* 2.4* 2.2* 2.1*    Recent Labs Lab 10/08/15 0524 10/09/15 0552 10/10/15 0552 10/11/15 0844  LIPASE 359* 77* 34 39   CBC:  Recent Labs Lab 10/08/15 0524 10/10/15 1321 10/11/15 0844 10/12/15 1001 10/14/15 0516  WBC 17.1* 12.0* 10.8* 10.2 9.7  NEUTROABS  --  10.2*  --   --  7.6  HGB 14.1 12.3* 12.1* 11.7* 10.7*  HCT 40.6 35.2* 34.8* 33.0* 31.2*  MCV 93.5 91.4 91.3 90.7 91.2  PLT 268 206 210 198 256  CBG:  Recent Labs Lab 10/13/15 1620 10/13/15 2025 10/14/15 0013 10/14/15 0408 10/14/15 0726  GLUCAP 135* 137* 139* 158* 120*    Recent Results (from the past 240 hour(s))  MRSA PCR Screening     Status: None   Collection Time: 10/07/15  6:40 AM  Result Value Ref Range Status   MRSA by PCR NEGATIVE NEGATIVE Final    Comment:        The GeneXpert MRSA Assay (FDA approved for NASAL specimens only), is one component of a comprehensive MRSA colonization surveillance program. It is not intended to diagnose MRSA infection nor to guide or monitor treatment for MRSA infections.      Studies: Dg Abd 1 View  10/12/2015  CLINICAL DATA:  Dobbhoff tube -------to see if in jejumum EXAM: ABDOMEN - 1 VIEW COMPARISON:  the previous day's  study FINDINGS: Feeding tube tip remains the gastric body. A few gas distended small bowel loops in the mid abdomen. Residual contrast in the nondilated colon, rectum and appendix. Right pelvic vascular calcifications. Regional bones unremarkable. IMPRESSION: 1. Feeding tube in the gastric body. 2. Nonobstructive bowel gas pattern with residual colonic contrast material. Electronically Signed   By: Lucrezia Europe M.D.   On: 10/12/2015 09:29   Dg Fluoro Rm 1-60 Min  10/12/2015  CLINICAL DATA:  Pancreatitis, feeding tube positioning beyond ligament of Treitz EXAM: FLOURO RM 1-60 MIN: FEEDING TUBE PLACEMENT CONTRAST:  16mL OMNIPAQUE IOHEXOL 300 MG/ML  SOLN FLUOROSCOPY TIME:  Radiation Exposure Index (as provided by the fluoroscopic device): Not provided If the device does not provide the exposure index: Fluoroscopy Time (in minutes and seconds):  6 minutes 12 seconds Number of Acquired Images:  4 screen captures during fluoroscopy COMPARISON:  None FINDINGS: Patient's indwelling feeding tube was manipulated under fluoroscopy. A 0.038 guidewire was placed through the tube. The tip was located within the stomach initially. Under fluoroscopic guidance, the tip was manipulated to the distal gastric antrum, across the pylorus, through the duodenum and placed beyond ligament of Treitz. Injection of 20 cc of Omnipaque 300 confirms positioning within proximal jejunum. Procedure tolerated very well by patient. IMPRESSION: Positioning of the feeding tube beyond the ligament of Treitz in the proximal jejunum. Electronically Signed   By: Lavonia Dana M.D.   On: 10/12/2015 11:23   Dg Chest Port 1 View  10/13/2015  CLINICAL DATA:  PICC line placement EXAM: PORTABLE CHEST 1 VIEW COMPARISON:  None. FINDINGS: Right upper extremity PICC line extends into the low SVC. There are small effusions and mild basilar opacities bilaterally. Pulmonary vasculature is normal. IMPRESSION: 1. Satisfactorily positioned right upper extremity PICC  line. 2. Small effusions. Adjacent basilar opacities may be atelectatic, but infectious infiltrate cannot be excluded. Electronically Signed   By: Andreas Newport M.D.   On: 10/13/2015 22:40    Scheduled Meds: . amLODipine  10 mg Oral Daily  . benazepril  40 mg Oral Daily  . heparin subcutaneous  5,000 Units Subcutaneous 3 times per day  .  HYDROmorphone (DILAUDID) injection  1 mg Intravenous 3 times per day  . insulin aspart  0-15 Units Subcutaneous 6 times per day  . metoprolol tartrate  50 mg Oral BID  . pantoprazole (PROTONIX) IV  40 mg Intravenous Q12H  . sodium chloride  10-40 mL Intracatheter Q12H   Continuous Infusions: . Marland KitchenTPN (CLINIMIX-E) Adult 40 mL/hr at 10/13/15 2300  . dextrose 5 % and 0.9 % NaCl with KCl 20 mEq/L 35 mL/hr at 10/13/15 2300  Active Problems:   HTN (hypertension)   HLD (hyperlipidemia)   Diabetes ([place])   Gout   Prostate cancer ([place])   Pancreatitis   Obstructive jaundice   Elevated transaminase level   Hypercalcemia   Hypokalemia   Acute pancreatitis   Elevated liver function tests   Elevated bilirubin   Abdominal pain   Pancreatitis, acute   Encounter for feeding tube placement    Time spent: 20 minutes   Dorothy Landgrebe. MD  [facility] Pager [phone]. If 7PM-7AM, please contact night-coverage at [email or web address], password [facility] 10/14/2015, 7:59 AM  LOS: 7 days     By signing my name below, I, Rennis Harding, attest that this documentation has been prepared under the direction and in the presence of Kathie Dike, MD. Electronically signed: Rennis Harding, Scribe. 10/14/2015 12:20pm   I, Dr. Kathie Dike, personally performed the services described in this documentaiton. All medical record entries made by the scribe were at my direction and in my presence. I have reviewed the chart and agree that the record reflects my personal performance and is accurate and complete  Kathie Dike, MD, 10/14/2015 12:28 PM

## 2015-10-15 DIAGNOSIS — Z4659 Encounter for fitting and adjustment of other gastrointestinal appliance and device: Secondary | ICD-10-CM | POA: Insufficient documentation

## 2015-10-15 DIAGNOSIS — R1084 Generalized abdominal pain: Secondary | ICD-10-CM

## 2015-10-15 DIAGNOSIS — E785 Hyperlipidemia, unspecified: Secondary | ICD-10-CM

## 2015-10-15 LAB — CBC
HEMATOCRIT: 31.1 % — AB (ref 39.0–52.0)
HEMOGLOBIN: 10.9 g/dL — AB (ref 13.0–17.0)
MCH: 32 pg (ref 26.0–34.0)
MCHC: 35 g/dL (ref 30.0–36.0)
MCV: 91.2 fL (ref 78.0–100.0)
Platelets: 266 10*3/uL (ref 150–400)
RBC: 3.41 MIL/uL — AB (ref 4.22–5.81)
RDW: 14.3 % (ref 11.5–15.5)
WBC: 9.9 10*3/uL (ref 4.0–10.5)

## 2015-10-15 LAB — MAGNESIUM: MAGNESIUM: 1.9 mg/dL (ref 1.7–2.4)

## 2015-10-15 LAB — COMPREHENSIVE METABOLIC PANEL
ALBUMIN: 2.1 g/dL — AB (ref 3.5–5.0)
ALT: 99 U/L — ABNORMAL HIGH (ref 17–63)
AST: 70 U/L — AB (ref 15–41)
Alkaline Phosphatase: 133 U/L — ABNORMAL HIGH (ref 38–126)
Anion gap: 7 (ref 5–15)
BILIRUBIN TOTAL: 2 mg/dL — AB (ref 0.3–1.2)
BUN: 12 mg/dL (ref 6–20)
CO2: 20 mmol/L — AB (ref 22–32)
Calcium: 8.2 mg/dL — ABNORMAL LOW (ref 8.9–10.3)
Chloride: 110 mmol/L (ref 101–111)
Creatinine, Ser: 0.79 mg/dL (ref 0.61–1.24)
GFR calc Af Amer: >60 mL/min (ref >60)
GFR calc non Af Amer: >60 mL/min (ref >60)
GLUCOSE: 143 mg/dL — AB (ref 65–99)
POTASSIUM: 3.3 mmol/L — AB (ref 3.5–5.1)
Sodium: 137 mmol/L (ref 135–145)
Total Protein: 5.6 g/dL — ABNORMAL LOW (ref 6.5–8.1)

## 2015-10-15 LAB — GLUCOSE, CAPILLARY
GLUCOSE-CAPILLARY: 142 mg/dL — AB (ref 65–99)
GLUCOSE-CAPILLARY: 120 mg/dL — AB (ref 65–99)
GLUCOSE-CAPILLARY: 144 mg/dL — AB (ref 65–99)
Glucose-Capillary: 150 mg/dL — ABNORMAL HIGH (ref 65–99)

## 2015-10-15 LAB — HEPARIN LEVEL (UNFRACTIONATED)
HEPARIN UNFRACTIONATED: 0.17 [IU]/mL — AB (ref 0.30–0.70)
HEPARIN UNFRACTIONATED: 0.44 [IU]/mL (ref 0.30–0.70)
Heparin Unfractionated: 0.16 IU/mL — ABNORMAL LOW (ref 0.30–0.70)

## 2015-10-15 LAB — PROTIME-INR
INR: 1.26 (ref 0.00–1.49)
PROTHROMBIN TIME: 16 s — AB (ref 11.6–15.2)

## 2015-10-15 LAB — PHOSPHORUS: PHOSPHORUS: 3.8 mg/dL (ref 2.5–4.6)

## 2015-10-15 MED ORDER — POTASSIUM CHLORIDE 10 MEQ/100ML IV SOLN
10.0000 meq | INTRAVENOUS | Status: AC
  Administered 2015-10-15 (×4): 10 meq via INTRAVENOUS
  Filled 2015-10-15 (×4): qty 100

## 2015-10-15 MED ORDER — WARFARIN VIDEO
Freq: Once | Status: AC
  Administered 2015-10-15: 12:00:00

## 2015-10-15 MED ORDER — WARFARIN SODIUM 5 MG PO TABS
5.0000 mg | ORAL_TABLET | Freq: Once | ORAL | Status: AC
  Administered 2015-10-15: 5 mg via ORAL
  Filled 2015-10-15: qty 1

## 2015-10-15 MED ORDER — HEPARIN BOLUS VIA INFUSION
2000.0000 [IU] | Freq: Once | INTRAVENOUS | Status: AC
  Administered 2015-10-15: 2000 [IU] via INTRAVENOUS
  Filled 2015-10-15: qty 2000

## 2015-10-15 MED ORDER — COUMADIN BOOK
Freq: Once | Status: AC
  Administered 2015-10-15: 12:00:00
  Filled 2015-10-15: qty 1

## 2015-10-15 MED ORDER — TRACE MINERALS CR-CU-MN-SE-ZN 10-1000-500-60 MCG/ML IV SOLN
INTRAVENOUS | Status: DC
  Administered 2015-10-15: 18:00:00 via INTRAVENOUS
  Filled 2015-10-15: qty 1200

## 2015-10-15 MED ORDER — HYDRALAZINE HCL 25 MG PO TABS
25.0000 mg | ORAL_TABLET | Freq: Three times a day (TID) | ORAL | Status: DC
  Administered 2015-10-15 – 2015-10-16 (×2): 25 mg via ORAL
  Filled 2015-10-15 (×2): qty 1

## 2015-10-15 NOTE — Progress Notes (Signed)
Subjective: Feels better today. No pain currently. Has not needed pain medication today. Is tolerating clear liquid diet without pain, N/V. No further GI complaints.  Objective: Vital signs in last 24 hours: Temp:  [97.8 F (36.6 C)-99.9 F (37.7 C)] 97.8 F (36.6 C) (11/04 1144) Pulse Rate:  [55-64] 56 (11/04 1144) Resp:  [17-20] 18 (11/04 1144) BP: (150-187)/(58-75) 150/58 mmHg (11/04 1144) SpO2:  [95 %-97 %] 97 % (11/04 1144) Weight:  [168 lb 9.1 oz (76.461 kg)] 168 lb 9.1 oz (76.461 kg) (11/04 0805) Last BM Date: 10/15/15 General:   Alert and oriented, pleasant Head:  Normocephalic and atraumatic. Eyes:  No icterus, sclera clear. Conjuctiva pink.  Heart:  S1, S2 present, no murmurs noted.  Lungs: Clear to auscultation bilaterally, without wheezing, rales, or rhonchi.  Abdomen:  Bowel sounds present, soft, non-tender, non-distended. No HSM or hernias noted. No rebound or guarding. No masses appreciated  Neurologic:  Alert and  oriented x4;  grossly normal neurologically. Psych:  Alert and cooperative. Normal mood and affect.  Intake/Output from previous day: 11/03 0701 - 11/04 0700 In: 989.3 [TPN:989.3] Out: -  Intake/Output this shift: Total I/O In: 720 [P.O.:720] Out: -   Lab Results:  Recent Labs  10/14/15 0516 10/15/15 0343  WBC 9.7 9.9  HGB 10.7* 10.9*  HCT 31.2* 31.1*  PLT 256 266   BMET  Recent Labs  10/14/15 0516 10/15/15 0343  NA 139 137  K 3.1* 3.3*  CL 113* 110  CO2 21* 20*  GLUCOSE 156* 143*  BUN 12 12  CREATININE 0.88 0.79  CALCIUM 8.2* 8.2*   LFT  Recent Labs  10/14/15 0516 10/15/15 0343  PROT 5.5* 5.6*  ALBUMIN 2.1* 2.1*  AST 50* 70*  ALT 91* 99*  ALKPHOS 143* 133*  BILITOT 2.0* 2.0*   PT/INR  Recent Labs  10/14/15 2010 10/15/15 0343  LABPROT 16.9* 16.0*  INR 1.36 1.26   Hepatitis Panel No results for input(s): HEPBSAG, HCVAB, HEPAIGM, HEPBIGM in the last 72 hours.   Studies/Results: Dg Chest Port 1  View  10/13/2015  CLINICAL DATA:  PICC line placement EXAM: PORTABLE CHEST 1 VIEW COMPARISON:  None. FINDINGS: Right upper extremity PICC line extends into the low SVC. There are small effusions and mild basilar opacities bilaterally. Pulmonary vasculature is normal. IMPRESSION: 1. Satisfactorily positioned right upper extremity PICC line. 2. Small effusions. Adjacent basilar opacities may be atelectatic, but infectious infiltrate cannot be excluded. Electronically Signed   By: Andreas Newport M.D.   On: 10/13/2015 22:40    Assessment: 74 year old male with acute, complicated pancreatitis with recent CT noting worsening of pancreatic/peripancreatic inflammation and necrotic segment between the body and the head. New splenic artery thrombosis. No need for antibiotics unless worsening clinical status to include fever, leukocytosis, etc. Nasojejunal tube dislodged yesterday morning, completely out. Patient was unable to obtain nutritional orally given severity of his pancreatitis and was subsequently started on TPN 10/13/15.    Elevated LFTs: without evidence of biliary obstruction. Likely bystander reaction. AST/ALT stable today, Alk phos decreased to 133, Bili stable/improved.  Anemia: likely multifactorial, dilutional. Hgb above 10.5. No evidence of bleeding. Remains stable today at 10.9  Today he is tolerating his diet well with continued no pain, N/V.   Plan: 1. Continue to advance diet as tolerated, has already been advanced to heart healthy/carb modified. 2. Continue supportive measures 3. Monitor LFTs 4. Per note yesterday, could likely benefit with short term anticoagulation by hematology at discharge for  splenic vein thrombosis, possibly until elective cholecystectomy to prevent propagation into the portal vein.    Walden Field, AGNP-C Adult & Gerontological Nurse Practitioner [facility]    LOS: 8 days    10/15/2015, 1:41 PM

## 2015-10-15 NOTE — Discharge Instructions (Addendum)

## 2015-10-15 NOTE — Progress Notes (Signed)
[place] NOTE  Pharmacy Consult for TPN Indication: Pulled out NJ tube, unable to advance to oral intake / pancreatitis  Allergies  Allergen Reactions  . Allopurinol Other (See Comments)    Significantly decreased WBC's  . Colchicine Other (See Comments)    Significantly decreased WBC's   Patient Measurements: Height: 5\' 5"  (165.1 cm) Weight: 168 lb 9.1 oz (76.461 kg) IBW/kg (Calculated) : 61.5 Adjusted Body Weight: 66.5 Kg  Vital Signs: Temp: 99.9 F (37.7 C) (11/04 0805) Temp Source: Oral (11/04 0805) BP: 163/61 mmHg (11/04 0845) Pulse Rate: 62 (11/04 0845) Intake/Output from previous day: 11/03 0701 - 11/04 0700 In: 989.3 [TPN:989.3] Out: -  Intake/Output from this shift: Total I/O In: 360 [P.O.:360] Out: -   Labs:  Recent Labs  10/12/15 1001 10/14/15 0516 10/14/15 2010 10/15/15 0343  WBC 10.2 9.7  --  9.9  HGB 11.7* 10.7*  --  10.9*  HCT 33.0* 31.2*  --  31.1*  PLT 198 256  --  266  INR  --   --  1.36 1.26    Recent Labs  10/12/15 1001 10/14/15 0516 10/15/15 0343  NA 141 139 137  K 3.3* 3.1* 3.3*  CL 112* 113* 110  CO2 22 21* 20*  GLUCOSE 121* 156* 143*  BUN 14 12 12   CREATININE 0.90 0.88 0.79  CALCIUM 8.2* 8.2* 8.2*  MG  --  2.0 1.9  PHOS  --  3.3 3.8  PROT 5.7* 5.5* 5.6*  ALBUMIN 2.2* 2.1* 2.1*  AST 78* 50* 70*  ALT 122* 91* 99*  ALKPHOS 108 143* 133*  BILITOT 3.2* 2.0* 2.0*  PREALBUMIN  --  5.8*  --    Estimated Creatinine Clearance: 79.7 mL/min (by C-G formula based on Cr of 0.79).    Recent Labs  10/14/15 2342 10/15/15 0400 10/15/15 0722  GLUCAP 101* 142* 150*   Medical History: Past Medical History  Diagnosis Date  . Hypertension   . Heart murmur   . ED (erectile dysfunction)   . Agranulocytosis ([place])   . Diabetes mellitus   . Hyperlipidemia   . Gout   . Adenomatous colon polyp   . Prostate cancer ([place])     Followed by Dr. Jeffie Pollock    Medications:  Prescriptions prior to admission  Medication  Sig Dispense Refill Last Dose  . amLODipine (NORVASC) 10 MG tablet Take 1 tablet (10 mg total) by mouth every evening. 90 tablet 2 10/06/2015 at Unknown time  . aspirin 81 MG tablet Take 81 mg by mouth every evening.    Past Week at Unknown time  . benazepril (LOTENSIN) 40 MG tablet TAKE ONE TABLET BY MOUTH ONCE DAILY 90 tablet 2 Past Week at Unknown time  . febuxostat (ULORIC) 40 MG tablet Take 2 tablets (80 mg total) by mouth daily. 60 tablet 3 Past Week at Unknown time  . finasteride (PROSCAR) 5 MG tablet Take 1 tablet (5 mg total) by mouth daily. 90 tablet 0 Past Week at Unknown time  . furosemide (LASIX) 40 MG tablet Take one and half po qd 135 tablet 2 Past Week at Unknown time  . metFORMIN (GLUCOPHAGE) 500 MG tablet Take 1 tablet (500 mg total) by mouth 2 (two) times daily with a meal. 180 tablet 2 Past Week at Unknown time  . metoprolol succinate (TOPROL-XL) 100 MG 24 hr tablet Take 1 tablet (100 mg total) by mouth 1 day or 1 dose. 90 tablet 2 Past Week at 2200  . Multiple Vitamins-Minerals (  MULTIVITAMIN PO) Take 1 tablet by mouth daily.   Past Week at Unknown time  . niacin (NIASPAN) 1000 MG CR tablet Take 1 tablet (1,000 mg total) by mouth at bedtime. 30 tablet 3 Past Week at Unknown time  . pravastatin (PRAVACHOL) 40 MG tablet Take 1 tablet (40 mg total) by mouth every evening. 90 tablet 2 Past Week at Unknown time   Insulin Requirements in the past 24 hours:  10 units SSI  Current Nutrition:  TPN started 10/13/15, clear liquid diet  Assessment: 74yo male briefly started on enteral feeding 10-12-15. Pt denies problems with tolerance but says he accidently pulled NJ out going to the bathroom. He reports abdominal pain better today. Toloerating clear liquid diet per RN.  Recommend consider resuming enteral feeding as preferred vs TPN due to maintainance of gut integrity, to attenuate oxidative stress and reduce pt complications such as infections, need for surgical interventions and  increased length of stay. Dietician note reviewed.  Sugars still elevated, receiving SSI.  Pt c/o being hungry.  Hypokalemia noted.  Hypocalcemia corrects to normal when account for low albumin.  I/O's do not appear accurate.  Nutritional Goals:  1860-2170 kCal  98-114 grams of protein per day  Plan:  Increase Clinimix E 5/15 at 44ml/hr Hold Lipids for now Continue IVF at current rate Replenish K+ with KCL runs x 4 (33meq total) Increase regular insulin 14 units per liter to TPN bag today Titrate TPN to goal as tolerated (when sugars controlled / stable) Monitor lytes, renal fxn, fluid status and glucose tolerance Continue Sliding Scale Insulin q4h checks  Pricilla Larsson PharmD 10/15/2015,9:34 AM

## 2015-10-15 NOTE — Progress Notes (Signed)
[place] for Heparin --> Warfarin Indication: Splenic Infarct, VTE treatment  Allergies  Allergen Reactions  . Allopurinol Other (See Comments)    Significantly decreased WBC's  . Colchicine Other (See Comments)    Significantly decreased WBC's    Patient Measurements: Height: 5\' 5"  (165.1 cm) Weight: 168 lb 9.1 oz (76.461 kg) IBW/kg (Calculated) : 61.5 HEPARIN DW (KG): 76.6   Vital Signs: Temp: 98.1 F (36.7 C) (11/04 1541) Temp Source: Oral (11/04 1541) BP: 160/69 mmHg (11/04 1541) Pulse Rate: 61 (11/04 1541)  Labs:  Recent Labs  10/14/15 0516 10/14/15 2010 10/15/15 0343 10/15/15 1451  HGB 10.7*  --  10.9*  --   HCT 31.2*  --  31.1*  --   PLT 256  --  266  --   LABPROT  --  16.9* 16.0*  --   INR  --  1.36 1.26  --   HEPARINUNFRC  --   --  0.16* 0.17*  CREATININE 0.88  --  0.79  --     Estimated Creatinine Clearance: 79.7 mL/min (by C-G formula based on Cr of 0.79).  Medical History: Past Medical History  Diagnosis Date  . Hypertension   . Heart murmur   . ED (erectile dysfunction)   . Agranulocytosis ([place])   . Diabetes mellitus   . Hyperlipidemia   . Gout   . Adenomatous colon polyp   . Prostate cancer ([place])     Followed by Dr. Jeffie Pollock    Medications:  Scheduled:  . amLODipine  10 mg Oral Daily  . benazepril  40 mg Oral Daily  . heparin  2,000 Units Intravenous Once  .  HYDROmorphone (DILAUDID) injection  1 mg Intravenous 3 times per day  . insulin aspart  0-15 Units Subcutaneous 6 times per day  . metoprolol tartrate  50 mg Oral BID  . pantoprazole (PROTONIX) IV  40 mg Intravenous Q12H  . potassium chloride  10 mEq Intravenous Q1 Hr x 4  . sodium chloride  10-40 mL Intracatheter Q12H  . Warfarin - Pharmacist Dosing Inpatient   Does not apply Q24H   Assessment: Okay for Protocol, OK for systemic AC per GI service.  Acute pancreatitis on admission and cholecystectomy planned eventually but likely not this  admission per TRH.  CBC stable, Heparin level below goal, INR below goal this AM.  No bleeding noted. Heparin level still below goal after rate increase  Goal of Therapy:  INR 2-3 Heparin level 0.3-0.7 units/ml Monitor platelets by anticoagulation protocol: Yes   Plan:  Heparin 2000 unit bolus, then increase heparin infusion to 1600 units/hour Repeat anti-Xa level in 6-8 hours and daily while on heparin Continue to monitor H&H and platelets  Daily PT/INR  Abner Greenspan, Cristen Bredeson Bennett 10/15/2015,4:06 PM

## 2015-10-15 NOTE — Progress Notes (Signed)
[facility] PROGRESS NOTE  Alan [name]. SVX:[id number] DOB: [birth year] DOA: 10/07/2015 PCP: Wardell Honour, MD  Assessment/Plan: 1. 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 Lipase WNL. Patient briefly had NJ tube for nutrition. Unfortunately this was dislodged on 11/2. Subsequently, PICC line was placed for TPN.  GI input appreciated. Patient is now tolerating a clear liquid diet without any n/v or pain. Will advance diet and reassess. If he tolerates diet well, will likely remove PICC line and discontinue TPN tomorrow.   2. 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 Will start on anticoagulation with Heparin and Coumadin and monitor INR. GI is in agreement.  Novel oral anticoagulants are cost-prohibitive. 3. Hypokalemia, will continue to replete.  4. DM type 2, stable. Will continue SSI 5. Essential HTN, blood pressures improving. Will continue prn hydralazine 6. Prostate CA.  Followed by urology in [place].  7. HLD, continue statin.  8. Gout, no evidence of acute flares.  Code Status: Full DVT prophylaxis: SCDs Family Communication:Family at bedside. Discussed with patient who understands and has no concerns at this time. Disposition Plan: Anticipate discharge in 1-2 days.    Consultants:  GI  PT- HH  Procedures:  NJ tube 10/31>>11/2  PICC line 11/2>>  Antibiotics:    HPI/Subjective: Feels okay. Denies any nausea or vomiting. Able to tolerate liquid diet without any n/v or increased pain. Would like to try solid food.   Objective: Filed Vitals:   10/15/15 0805  BP: 150/65  Pulse: 58  Temp: 99.9 F (37.7 C)  Resp: 20    Intake/Output Summary (Last 24 hours) at 10/15/15 0812 Last data filed at 10/15/15 0700  Gross per 24 hour  Intake 989.33 ml  Output      0 ml  Net 989.33 ml   Filed Weights   10/13/15 0530 10/14/15 0625 10/15/15 0805  Weight: 76.8 kg (169 lb 5 oz) 76.567 kg (168 lb 12.8 oz) 76.461 kg (168 lb 9.1 oz)     Exam:   General: NAD, looks comfortable  Cardiovascular: RRR, S1, S2   Respiratory: clear bilaterally, No wheezing, rales or rhonchi  Abdomen: soft, non tender, no distention , bowel sounds normal  Musculoskeletal: Trace edema BLE  Data Reviewed: Basic Metabolic Panel:  Recent Labs Lab 10/10/15 1321 10/11/15 0844 10/12/15 1001 10/14/15 0516 10/15/15 0343  NA 136 139 141 139 137  K 3.4* 3.2* 3.3* 3.1* 3.3*  CL 108 110 112* 113* 110  CO2 22 23 22  21* 20*  GLUCOSE 147* 141* 121* 156* 143*  BUN 14 15 14 12 12   CREATININE 0.72 0.82 0.90 0.88 0.79  CALCIUM 8.4* 8.3* 8.2* 8.2* 8.2*  MG  --   --   --  2.0 1.9  PHOS  --   --   --  3.3 3.8   Liver Function Tests:  Recent Labs Lab 10/10/15 0552 10/11/15 0844 10/12/15 1001 10/14/15 0516 10/15/15 0343  AST 32 59* 78* 50* 70*  ALT 112* 103* 122* 91* 99*  ALKPHOS 76 97 108 143* 133*  BILITOT 1.9* 3.2* 3.2* 2.0* 2.0*  PROT 6.2* 6.0* 5.7* 5.5* 5.6*  ALBUMIN 2.5* 2.4* 2.2* 2.1* 2.1*    Recent Labs Lab 10/09/15 0552 10/10/15 0552 10/11/15 0844  LIPASE 77* 34 39   CBC:  Recent Labs Lab 10/10/15 1321 10/11/15 0844 10/12/15 1001 10/14/15 0516  WBC 12.0* 10.8* 10.2 9.7  NEUTROABS 10.2*  --   --  7.6  HGB 12.3* 12.1* 11.7* 10.7*  HCT 35.2* 34.8* 33.0* 31.2*  MCV 91.4 91.3 90.7 91.2  PLT 206 210 198 256    CBG:  Recent Labs Lab 10/14/15 1617 10/14/15 2119 10/14/15 2342 10/15/15 0400 10/15/15 0722  GLUCAP 115* 158* 101* 142* 150*    Recent Results (from the past 240 hour(s))  MRSA PCR Screening     Status: None   Collection Time: 10/07/15  6:40 AM  Result Value Ref Range Status   MRSA by PCR NEGATIVE NEGATIVE Final    Comment:        The GeneXpert MRSA Assay (FDA approved for NASAL specimens only), is one component of a comprehensive MRSA colonization surveillance program. It is not intended to diagnose MRSA infection nor to guide or monitor treatment for MRSA infections.       Studies: Dg Chest Port 1 View  10/13/2015  CLINICAL DATA:  PICC line placement EXAM: PORTABLE CHEST 1 VIEW COMPARISON:  None. FINDINGS: Right upper extremity PICC line extends into the low SVC. There are small effusions and mild basilar opacities bilaterally. Pulmonary vasculature is normal. IMPRESSION: 1. Satisfactorily positioned right upper extremity PICC line. 2. Small effusions. Adjacent basilar opacities may be atelectatic, but infectious infiltrate cannot be excluded. Electronically Signed   By: Andreas Newport M.D.   On: 10/13/2015 22:40    Scheduled Meds: . amLODipine  10 mg Oral Daily  . benazepril  40 mg Oral Daily  .  HYDROmorphone (DILAUDID) injection  1 mg Intravenous 3 times per day  . insulin aspart  0-15 Units Subcutaneous 6 times per day  . metoprolol tartrate  50 mg Oral BID  . pantoprazole (PROTONIX) IV  40 mg Intravenous Q12H  . sodium chloride  10-40 mL Intracatheter Q12H  . warfarin  5 mg Oral Once  . Warfarin - Pharmacist Dosing Inpatient   Does not apply Q24H   Continuous Infusions: . Marland KitchenTPN (CLINIMIX-E) Adult 40 mL/hr at 10/14/15 1816  . dextrose 5 % and 0.9 % NaCl with KCl 20 mEq/L 35 mL/hr at 10/13/15 2300  . heparin 1,200 Units/hr (10/14/15 2010)    Active Problems:   HTN (hypertension)   HLD (hyperlipidemia)   Diabetes ([place])   Gout   Prostate cancer ([place])   Pancreatitis   Obstructive jaundice   Elevated transaminase level   Hypercalcemia   Hypokalemia   Acute pancreatitis   Elevated liver function tests   Elevated bilirubin   Abdominal pain   Pancreatitis, acute   Encounter for feeding tube placement    Time spent: 20 minutes   Parish Augustine. MD  [facility] Pager [phone]. If 7PM-7AM, please contact night-coverage at [email or web address], password [facility] 10/15/2015, 8:12 AM  LOS: 8 days    By signing my name below, I, Rosalie Doctor, attest that this documentation has been prepared under the direction and in the presence of  [name]. MD Electronically Signed: Rosalie Doctor, Scribe. 10/15/2015 2:10pm  I, Dr. Kathie Dike, personally performed the services described in this documentaiton. All medical record entries made by the scribe were at my direction and in my presence. I have reviewed the chart and agree that the record reflects my personal performance and is accurate and complete  Kathie Dike, MD, 10/15/2015 2:14 PM

## 2015-10-15 NOTE — Progress Notes (Addendum)
[place] for Heparin --> Warfarin Indication: Splenic Infarct, VTE treatment  Allergies  Allergen Reactions  . Allopurinol Other (See Comments)    Significantly decreased WBC's  . Colchicine Other (See Comments)    Significantly decreased WBC's    Patient Measurements: Height: 5\' 5"  (165.1 cm) Weight: 168 lb 9.1 oz (76.461 kg) IBW/kg (Calculated) : 61.5 HEPARIN DW (KG): 76.6   Vital Signs: Temp: 99.9 F (37.7 C) (11/04 0805) Temp Source: Oral (11/04 0805) BP: 163/61 mmHg (11/04 0845) Pulse Rate: 62 (11/04 0845)  Labs:  Recent Labs  10/12/15 1001 10/14/15 0516 10/14/15 2010 10/15/15 0343  HGB 11.7* 10.7*  --  10.9*  HCT 33.0* 31.2*  --  31.1*  PLT 198 256  --  266  LABPROT  --   --  16.9* 16.0*  INR  --   --  1.36 1.26  HEPARINUNFRC  --   --   --  0.16*  CREATININE 0.90 0.88  --  0.79    Estimated Creatinine Clearance: 79.7 mL/min (by C-G formula based on Cr of 0.79).  Medical History: Past Medical History  Diagnosis Date  . Hypertension   . Heart murmur   . ED (erectile dysfunction)   . Agranulocytosis ([place])   . Diabetes mellitus   . Hyperlipidemia   . Gout   . Adenomatous colon polyp   . Prostate cancer ([place])     Followed by Dr. Jeffie Pollock    Medications:  Scheduled:  . amLODipine  10 mg Oral Daily  . benazepril  40 mg Oral Daily  .  HYDROmorphone (DILAUDID) injection  1 mg Intravenous 3 times per day  . insulin aspart  0-15 Units Subcutaneous 6 times per day  . metoprolol tartrate  50 mg Oral BID  . pantoprazole (PROTONIX) IV  40 mg Intravenous Q12H  . sodium chloride  10-40 mL Intracatheter Q12H  . warfarin  5 mg Oral Once  . Warfarin - Pharmacist Dosing Inpatient   Does not apply Q24H   Assessment: Okay for Protocol, OK for systemic AC per GI service.  Acute pancreatitis on admission and cholecystectomy planned eventually but likely not this admission per TRH.  CBC stable, Heparin level below goal, INR below  goal.  No bleeding noted.  Goal of Therapy:  INR 2-3 Heparin level 0.3-0.7 units/ml Monitor platelets by anticoagulation protocol: Yes   Plan:  Increase heparin infusion to 1400 units/hr Repeat anti-Xa level in 6-8 hours and daily while on heparin Continue to monitor H&H and platelets  Repeat Warfarin 5mg  PO x 1 Daily PT/INR  Biagio Quint R 10/15/2015,9:33 AM

## 2015-10-16 DIAGNOSIS — K859 Acute pancreatitis without necrosis or infection, unspecified: Secondary | ICD-10-CM

## 2015-10-16 DIAGNOSIS — K833 Fistula of bile duct: Secondary | ICD-10-CM

## 2015-10-16 DIAGNOSIS — R7989 Other specified abnormal findings of blood chemistry: Secondary | ICD-10-CM

## 2015-10-16 DIAGNOSIS — R17 Unspecified jaundice: Secondary | ICD-10-CM

## 2015-10-16 DIAGNOSIS — K838 Other specified diseases of biliary tract: Secondary | ICD-10-CM

## 2015-10-16 LAB — GLUCOSE, CAPILLARY
GLUCOSE-CAPILLARY: 125 mg/dL — AB (ref 65–99)
GLUCOSE-CAPILLARY: 126 mg/dL — AB (ref 65–99)
Glucose-Capillary: 123 mg/dL — ABNORMAL HIGH (ref 65–99)
Glucose-Capillary: 133 mg/dL — ABNORMAL HIGH (ref 65–99)
Glucose-Capillary: 135 mg/dL — ABNORMAL HIGH (ref 65–99)
Glucose-Capillary: 137 mg/dL — ABNORMAL HIGH (ref 65–99)
Glucose-Capillary: 137 mg/dL — ABNORMAL HIGH (ref 65–99)

## 2015-10-16 LAB — COMPREHENSIVE METABOLIC PANEL
ALBUMIN: 2.5 g/dL — AB (ref 3.5–5.0)
ALK PHOS: 159 U/L — AB (ref 38–126)
ALT: 112 U/L — ABNORMAL HIGH (ref 17–63)
ANION GAP: 8 (ref 5–15)
AST: 67 U/L — AB (ref 15–41)
BILIRUBIN TOTAL: 1.9 mg/dL — AB (ref 0.3–1.2)
BUN: 12 mg/dL (ref 6–20)
CALCIUM: 8.7 mg/dL — AB (ref 8.9–10.3)
CO2: 19 mmol/L — AB (ref 22–32)
Chloride: 108 mmol/L (ref 101–111)
Creatinine, Ser: 0.79 mg/dL (ref 0.61–1.24)
GFR calc Af Amer: >60 mL/min (ref >60)
GFR calc non Af Amer: >60 mL/min (ref >60)
GLUCOSE: 142 mg/dL — AB (ref 65–99)
POTASSIUM: 3.6 mmol/L (ref 3.5–5.1)
SODIUM: 135 mmol/L (ref 135–145)
TOTAL PROTEIN: 6.5 g/dL (ref 6.5–8.1)

## 2015-10-16 LAB — HEPARIN LEVEL (UNFRACTIONATED)
HEPARIN UNFRACTIONATED: 0.27 [IU]/mL — AB (ref 0.30–0.70)
HEPARIN UNFRACTIONATED: 0.47 [IU]/mL (ref 0.30–0.70)

## 2015-10-16 LAB — CBC
HEMATOCRIT: 33 % — AB (ref 39.0–52.0)
HEMOGLOBIN: 11.8 g/dL — AB (ref 13.0–17.0)
MCH: 31.9 pg (ref 26.0–34.0)
MCHC: 35.8 g/dL (ref 30.0–36.0)
MCV: 89.2 fL (ref 78.0–100.0)
Platelets: 296 10*3/uL (ref 150–400)
RBC: 3.7 MIL/uL — AB (ref 4.22–5.81)
RDW: 13.4 % (ref 11.5–15.5)
WBC: 11.6 10*3/uL — AB (ref 4.0–10.5)

## 2015-10-16 LAB — PHOSPHORUS: PHOSPHORUS: 3.6 mg/dL (ref 2.5–4.6)

## 2015-10-16 LAB — DIFFERENTIAL
BASOS ABS: 0 10*3/uL (ref 0.0–0.1)
BASOS PCT: 0 %
EOS ABS: 0.1 10*3/uL (ref 0.0–0.7)
Eosinophils Relative: 1 %
Lymphocytes Relative: 11 %
Lymphs Abs: 1.3 10*3/uL (ref 0.7–4.0)
MONOS PCT: 8 %
Monocytes Absolute: 0.9 10*3/uL (ref 0.1–1.0)
NEUTROS ABS: 9.3 10*3/uL — AB (ref 1.7–7.7)
Neutrophils Relative %: 80 %

## 2015-10-16 LAB — TRIGLYCERIDES: TRIGLYCERIDES: 159 mg/dL — AB (ref <150)

## 2015-10-16 LAB — PROTIME-INR
INR: 1.23 (ref 0.00–1.49)
Prothrombin Time: 15.7 seconds — ABNORMAL HIGH (ref 11.6–15.2)

## 2015-10-16 LAB — PREALBUMIN: Prealbumin: 9.4 mg/dL — ABNORMAL LOW (ref 18–38)

## 2015-10-16 LAB — MAGNESIUM: Magnesium: 1.9 mg/dL (ref 1.7–2.4)

## 2015-10-16 MED ORDER — TRACE MINERALS CR-CU-MN-SE-ZN 10-1000-500-60 MCG/ML IV SOLN
INTRAVENOUS | Status: DC
  Filled 2015-10-16: qty 1200

## 2015-10-16 MED ORDER — WARFARIN SODIUM 5 MG PO TABS
7.5000 mg | ORAL_TABLET | Freq: Once | ORAL | Status: AC
  Administered 2015-10-16: 7.5 mg via ORAL
  Filled 2015-10-16: qty 2

## 2015-10-16 MED ORDER — HYDRALAZINE HCL 25 MG PO TABS
50.0000 mg | ORAL_TABLET | Freq: Three times a day (TID) | ORAL | Status: DC
  Administered 2015-10-16 – 2015-10-19 (×9): 50 mg via ORAL
  Filled 2015-10-16 (×9): qty 2

## 2015-10-16 NOTE — Progress Notes (Signed)
[place] for Heparin --> Warfarin Indication: Splenic Infarct, VTE treatment  Allergies  Allergen Reactions  . Allopurinol Other (See Comments)    Significantly decreased WBC's  . Colchicine Other (See Comments)    Significantly decreased WBC's    Patient Measurements: Height: 5\' 5"  (165.1 cm) Weight: 166 lb 10.7 oz (75.6 kg) IBW/kg (Calculated) : 61.5 HEPARIN DW (KG): 76.6   Vital Signs: Temp: 98 F (36.7 C) (11/05 0531) Temp Source: Oral (11/05 0531) BP: 183/61 mmHg (11/05 0759) Pulse Rate: 78 (11/05 0759)  Labs:  Recent Labs  10/14/15 0516 10/14/15 2010  10/15/15 0343 10/15/15 1451 10/15/15 2149 10/16/15 0743  HGB 10.7*  --   --  10.9*  --   --   --   HCT 31.2*  --   --  31.1*  --   --   --   PLT 256  --   --  266  --   --   --   LABPROT  --  16.9*  --  16.0*  --   --  15.7*  INR  --  1.36  --  1.26  --   --  1.23  HEPARINUNFRC  --   --   < > 0.16* 0.17* 0.44 0.27*  CREATININE 0.88  --   --  0.79  --   --  0.79  < > = values in this interval not displayed.  Estimated Creatinine Clearance: 79.2 mL/min (by C-G formula based on Cr of 0.79).  Medical History: Past Medical History  Diagnosis Date  . Hypertension   . Heart murmur   . ED (erectile dysfunction)   . Agranulocytosis ([place])   . Diabetes mellitus   . Hyperlipidemia   . Gout   . Adenomatous colon polyp   . Prostate cancer ([place])     Followed by Dr. Jeffie Pollock    Medications:  Scheduled:  . amLODipine  10 mg Oral Daily  . benazepril  40 mg Oral Daily  . hydrALAZINE  25 mg Oral TID  .  HYDROmorphone (DILAUDID) injection  1 mg Intravenous 3 times per day  . insulin aspart  0-15 Units Subcutaneous 6 times per day  . metoprolol tartrate  50 mg Oral BID  . pantoprazole (PROTONIX) IV  40 mg Intravenous Q12H  . sodium chloride  10-40 mL Intracatheter Q12H  . Warfarin - Pharmacist Dosing Inpatient   Does not apply Q24H   Assessment: Okay for Protocol, OK for  systemic AC per GI service.  Acute pancreatitis on admission and cholecystectomy planned eventually but likely not this admission per TRH.  CBC stable, Heparin level below goal this AM, INR below goal this AM.  No bleeding noted. Heparin level was at goal last evening, slightly below goal this AM. INR remains below goal after 2 doses of Coumadin 5 mg.  Goal of Therapy:  INR 2-3 Heparin level 0.3-0.7 units/ml Monitor platelets by anticoagulation protocol: Yes   Plan:  Increase heparin infusion to 1700 units per hour Repeat anti-Xa level in 6-8 hours and daily while on heparin Coumadin 7.5 mg po x 1 dose today Continue to monitor H&H and platelets  Daily PT/INR  Alan [name], Alan [name] 10/16/2015,9:35 AM

## 2015-10-16 NOTE — Progress Notes (Signed)
[facility] PROGRESS NOTE  [name] OTL:[id number] DOB: 10-May-1941 DOA: 10/07/2015 PCP: Wardell Honour, MD  Assessment/Plan: 1. 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 Lipase WNL. Patient briefly had NJ tube for nutrition. Unfortunately this was dislodged on 11/2. Subsequently, PICC line was placed for TPN.  GI input appreciated. Patient is now tolerating a solid diet without any n/v or pain. Since advanced diet was tolerated, TPN will be discontinued today. PICC line removal when ready for discharge. Patient will need outpatient follow up with general surgeon for cholecystectomy 2. 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 Will start on anticoagulation with Heparin and Coumadin and monitor INR. INR 1.23 today. GI is in agreement. Warfarin initiated.  Novel oral anticoagulants are cost-prohibitive. 3. Hypokalemia, Replete.  4. DM type 2, stable. Continue SSI 5. Essential HTN, blood pressures remain elevated. Will increase prn hydralazine 6. Prostate CA.  Followed by urology in [place].  7. HLD, continue statin.  8. Gout, no evidence of acute flares.  Code Status: Full DVT prophylaxis: SCDs Family Communication:  Discussed care plan with patient who understands and has no concerns at this time. Disposition Plan: Anticipate discharge in 1-2 days.    Consultants:  GI  PT- HH  Procedures:  NJ tube 10/31>>11/2  PICC line 11/2>>  Antibiotics:    HPI/Subjective: Feeling better, was able to eat solid food last night. No n/v. No abdominal pain  Objective: Filed Vitals:   10/16/15 0759  BP: 183/61  Pulse: 78  Temp:   Resp:     Intake/Output Summary (Last 24 hours) at 10/16/15 0820 Last data filed at 10/16/15 0817  Gross per 24 hour  Intake    840 ml  Output   1000 ml  Net   -160 ml   Filed Weights   10/14/15 0625 10/15/15 0805 10/16/15 0531  Weight: 76.567 kg (168 lb 12.8 oz) 76.461 kg (168 lb 9.1 oz) 75.6 kg (166 lb 10.7  oz)    Exam:   General: appears calm and comfortable, lying in bed  Cardiovascular: Regular rate and rhythm, no m/r/g  Respiratory: CTAB, no w/r/r  Abdomen: soft, ntnd , bowel sounds present  Musculoskeletal: Trace edema BLE  Data Reviewed: Basic Metabolic Panel:  Recent Labs Lab 10/10/15 1321 10/11/15 0844 10/12/15 1001 10/14/15 0516 10/15/15 0343  NA 136 139 141 139 137  K 3.4* 3.2* 3.3* 3.1* 3.3*  CL 108 110 112* 113* 110  CO2 22 23 22  21* 20*  GLUCOSE 147* 141* 121* 156* 143*  BUN 14 15 14 12 12   CREATININE 0.72 0.82 0.90 0.88 0.79  CALCIUM 8.4* 8.3* 8.2* 8.2* 8.2*  MG  --   --   --  2.0 1.9  PHOS  --   --   --  3.3 3.8   Liver Function Tests:  Recent Labs Lab 10/10/15 0552 10/11/15 0844 10/12/15 1001 10/14/15 0516 10/15/15 0343  AST 32 59* 78* 50* 70*  ALT 112* 103* 122* 91* 99*  ALKPHOS 76 97 108 143* 133*  BILITOT 1.9* 3.2* 3.2* 2.0* 2.0*  PROT 6.2* 6.0* 5.7* 5.5* 5.6*  ALBUMIN 2.5* 2.4* 2.2* 2.1* 2.1*    Recent Labs Lab 10/10/15 0552 10/11/15 0844  LIPASE 34 39   CBC:  Recent Labs Lab 10/10/15 1321 10/11/15 0844 10/12/15 1001 10/14/15 0516 10/15/15 0343  WBC 12.0* 10.8* 10.2 9.7 9.9  NEUTROABS 10.2*  --   --  7.6  --   HGB 12.3* 12.1* 11.7* 10.7* 10.9*  HCT 35.2* 34.8* 33.0* 31.2* 31.1*  MCV 91.4 91.3 90.7 91.2 91.2  PLT 206 210 198 256 266    CBG:  Recent Labs Lab 10/15/15 1601 10/15/15 2111 10/16/15 0007 10/16/15 0440 10/16/15 0741  GLUCAP 125* 144* 137* 126* 137*    Recent Results (from the past 240 hour(s))  MRSA PCR Screening     Status: None   Collection Time: 10/07/15  6:40 AM  Result Value Ref Range Status   MRSA by PCR NEGATIVE NEGATIVE Final    Comment:        The GeneXpert MRSA Assay (FDA approved for NASAL specimens only), is one component of a comprehensive MRSA colonization surveillance program. It is not intended to diagnose MRSA infection nor to guide or monitor treatment for MRSA  infections.      Studies: No results found.  Scheduled Meds: . amLODipine  10 mg Oral Daily  . benazepril  40 mg Oral Daily  . hydrALAZINE  25 mg Oral TID  .  HYDROmorphone (DILAUDID) injection  1 mg Intravenous 3 times per day  . insulin aspart  0-15 Units Subcutaneous 6 times per day  . metoprolol tartrate  50 mg Oral BID  . pantoprazole (PROTONIX) IV  40 mg Intravenous Q12H  . sodium chloride  10-40 mL Intracatheter Q12H  . Warfarin - Pharmacist Dosing Inpatient   Does not apply Q24H   Continuous Infusions: . Marland KitchenTPN (CLINIMIX-E) Adult 50 mL/hr at 10/15/15 1811  . dextrose 5 % and 0.9 % NaCl with KCl 20 mEq/L 35 mL/hr at 10/15/15 4854  . heparin 1,700 Units/hr (10/16/15 0813)    Active Problems:   HTN (hypertension)   HLD (hyperlipidemia)   Diabetes (HCC)   Gout   Prostate cancer ([place])   Pancreatitis   Obstructive jaundice   Elevated transaminase level   Hypercalcemia   Hypokalemia   Acute pancreatitis   Elevated liver function tests   Elevated bilirubin   Abdominal pain   Pancreatitis, acute   Encounter for feeding tube placement   Encounter for nasogastric (NG) tube placement    Time spent: 20 minutes   Alan [name]. MD  [facility] Pager [phone]. If 7PM-7AM, please contact night-coverage at [email or web address], password [facility] 10/16/2015, 8:20 AM  LOS: 9 days    By signing my name below, I, Rosalie Doctor, attest that this documentation has been prepared under the direction and in the presence of [name]. MD Electronically Signed: Rosalie Doctor, Scribe. 10/16/2015 10:57 AM  I, Dr. Kathie Dike, personally performed the services described in this documentaiton. All medical record entries made by the scribe were at my direction and in my presence. I have reviewed the chart and agree that the record reflects my personal performance and is accurate and complete  Kathie Dike, MD, 10/16/2015 11:05 AM

## 2015-10-16 NOTE — Progress Notes (Signed)
Patient has been up to chair majority of the day. Tolerated well.

## 2015-10-16 NOTE — Progress Notes (Addendum)
[place] NOTE  Pharmacy Consult for TPN Indication: Pulled out NJ tube, unable to advance to oral intake / pancreatitis  Allergies  Allergen Reactions  . Allopurinol Other (See Comments)    Significantly decreased WBC's  . Colchicine Other (See Comments)    Significantly decreased WBC's   Patient Measurements: Height: 5\' 5"  (165.1 cm) Weight: 166 lb 10.7 oz (75.6 kg) IBW/kg (Calculated) : 61.5 Adjusted Body Weight: 66.5 Kg  Vital Signs: Temp: 98 F (36.7 C) (11/05 0531) Temp Source: Oral (11/05 0531) BP: 183/61 mmHg (11/05 0759) Pulse Rate: 78 (11/05 0759) Intake/Output from previous day: 11/04 0701 - 11/05 0700 In: 840 [P.O.:840] Out: 800 [Urine:800] Intake/Output from this shift: Total I/O In: -  Out: 200 [Urine:200]  Labs:  Recent Labs  10/14/15 0516 10/14/15 2010 10/15/15 0343 10/16/15 0743  WBC 9.7  --  9.9 11.6*  HGB 10.7*  --  10.9* 11.8*  HCT 31.2*  --  31.1* 33.0*  PLT 256  --  266 296  INR  --  1.36 1.26 1.23    Recent Labs  10/14/15 0516 10/15/15 0343 10/16/15 0743  NA 139 137 135  K 3.1* 3.3* 3.6  CL 113* 110 108  CO2 21* 20* 19*  GLUCOSE 156* 143* 142*  BUN 12 12 12   CREATININE 0.88 0.79 0.79  CALCIUM 8.2* 8.2* 8.7*  MG 2.0 1.9 1.9  PHOS 3.3 3.8 3.6  PROT 5.5* 5.6* 6.5  ALBUMIN 2.1* 2.1* 2.5*  AST 50* 70* 67*  ALT 91* 99* 112*  ALKPHOS 143* 133* 159*  BILITOT 2.0* 2.0* 1.9*  PREALBUMIN 5.8*  --   --   TRIG  --   --  159*   Estimated Creatinine Clearance: 79.2 mL/min (by C-G formula based on Cr of 0.79).    Recent Labs  10/16/15 0007 10/16/15 0440 10/16/15 0741  GLUCAP 137* 126* 137*   Medical History: Past Medical History  Diagnosis Date  . Hypertension   . Heart murmur   . ED (erectile dysfunction)   . Agranulocytosis ([place])   . Diabetes mellitus   . Hyperlipidemia   . Gout   . Adenomatous colon polyp   . Prostate cancer ([place])     Followed by Dr. Jeffie Pollock    Medications:  Prescriptions prior  to admission  Medication Sig Dispense Refill Last Dose  . amLODipine (NORVASC) 10 MG tablet Take 1 tablet (10 mg total) by mouth every evening. 90 tablet 2 10/06/2015 at Unknown time  . aspirin 81 MG tablet Take 81 mg by mouth every evening.    Past Week at Unknown time  . benazepril (LOTENSIN) 40 MG tablet TAKE ONE TABLET BY MOUTH ONCE DAILY 90 tablet 2 Past Week at Unknown time  . febuxostat (ULORIC) 40 MG tablet Take 2 tablets (80 mg total) by mouth daily. 60 tablet 3 Past Week at Unknown time  . finasteride (PROSCAR) 5 MG tablet Take 1 tablet (5 mg total) by mouth daily. 90 tablet 0 Past Week at Unknown time  . furosemide (LASIX) 40 MG tablet Take one and half po qd 135 tablet 2 Past Week at Unknown time  . metFORMIN (GLUCOPHAGE) 500 MG tablet Take 1 tablet (500 mg total) by mouth 2 (two) times daily with a meal. 180 tablet 2 Past Week at Unknown time  . metoprolol succinate (TOPROL-XL) 100 MG 24 hr tablet Take 1 tablet (100 mg total) by mouth 1 day or 1 dose. 90 tablet 2 Past Week at 2200  .  Multiple Vitamins-Minerals (MULTIVITAMIN PO) Take 1 tablet by mouth daily.   Past Week at Unknown time  . niacin (NIASPAN) 1000 MG CR tablet Take 1 tablet (1,000 mg total) by mouth at bedtime. 30 tablet 3 Past Week at Unknown time  . pravastatin (PRAVACHOL) 40 MG tablet Take 1 tablet (40 mg total) by mouth every evening. 90 tablet 2 Past Week at Unknown time   Insulin Requirements in the past 24 hours:  10 units SSI  Current Nutrition:  TPN started 10/13/15, clear liquid diet  Assessment: 74yo male briefly started on enteral feeding 10-12-15. Pt denies problems with tolerance but says he accidently pulled NJ out going to the bathroom. He reports abdominal pain better today. Diet continues to be advanced.   Recommend consider resuming enteral feeding as preferred vs TPN due to maintainance of gut integrity, to attenuate oxidative stress and reduce pt complications such as infections, need for surgical  interventions and increased length of stay. Dietician note reviewed.  Sugars still elevated, receiving SSI. Insulin 10 units yesterday in addition to TPN  Pt c/o being hungry.  Hypocalcemia corrects to normal when account for low albumin.    GI note indicates if he continues to tolerate his diet, would anticipate TPN could be tapered off in the next 24-48 hours.  Nutritional Goals:  1860-2170 kCal  98-114 grams of protein per day  Plan:  Continue Clinimix E 5/15 at 51ml/hr, no increase as GI note indicates possible taper off TPN in next 24 to 48 hours. Hold Lipids for now Continue IVF at current rate Increase regular insulin 20 units per liter to TPN bag today Monitor lytes, renal fxn, fluid status and glucose tolerance Continue Sliding Scale Insulin q4h checks  Abner Greenspan, Mitchell Epling Bennett Clinical Pharmacist 10/16/2015,9:47 AM  MD discontinued TPN RN to reduce rate to 25 ml/hr for 2 hours, then discontinue TPN per taper protocol  Annalaya Wile Bennett  Clinical Pharmacist 10/16/15 1145 AM

## 2015-10-16 NOTE — Progress Notes (Addendum)
  Subjective:  Patient denies abdominal pain. He is not having any difficulty with heart healthy diet. He has not experienced nausea or vomiting. His bowels moved earlier today and he passed soft stool. He denies shortness of breath.  Objective: Blood pressure 183/61, pulse 78, temperature 98 F (36.7 C), temperature source Oral, resp. rate 20, height 5\' 5"  (1.651 m), weight 166 lb 10.7 oz (75.6 kg), SpO2 100 %. Patient is alert and in no acute distress. Conjunctiva is pink. Sclera is nonicteric Oropharyngeal mucosa is normal. No neck masses or thyromegaly noted. Cardiac exam with regular rhythm normal S1 and S2. No murmur or gallop noted. Auscultation of lungs revealed few rales at both bases. Abdomen is full. He has birthmark and left abdomen and small umbilicca hernia. Bowel sounds are normal. On palpation he has soft abdomen without tenderness organomegaly or masses. No LE edema or clubbing noted.  Labs/studies Results:   Recent Labs  10/14/15 0516 10/15/15 0343 10/16/15 0743  WBC 9.7 9.9 11.6*  HGB 10.7* 10.9* 11.8*  HCT 31.2* 31.1* 33.0*  PLT 256 266 296    BMET   Recent Labs  10/14/15 0516 10/15/15 0343 10/16/15 0743  NA 139 137 135  K 3.1* 3.3* 3.6  CL 113* 110 108  CO2 21* 20* 19*  GLUCOSE 156* 143* 142*  BUN 12 12 12   CREATININE 0.88 0.79 0.79  CALCIUM 8.2* 8.2* 8.7*    LFT   Recent Labs  10/14/15 0516 10/15/15 0343 10/16/15 0743  PROT 5.5* 5.6* 6.5  ALBUMIN 2.1* 2.1* 2.5*  AST 50* 70* 67*  ALT 91* 99* 112*  ALKPHOS 143* 133* 159*  BILITOT 2.0* 2.0* 1.9*    PT/INR   Recent Labs  10/15/15 0343 10/16/15 0743  LABPROT 16.0* 15.7*  INR 1.26 1.23      Assessment:  #1. Biliary pancreatitis. Severe pancreatitis with fluid collection and necrosis. He was begun on clear liquids 2 days ago and now on heart healthy diet which he is tolerating without abdominal pain nausea or vomiting. He has come a long way as he is still at risk for pancreatic  pseudocyst. He will need cholecystectomy at a later date. #2. Anemia secondary to acute illness. H&H is coming up. #3. Elevated transaminases. Overall trend is one of improvement with numbers have not returned to normal. He had mildly dilated biliary system but no stones identified in bile duct. Will check LFTs in a.m. and determine if he needs an MRCP. #4. 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 Patient has been treated with heparin and was begun on warfarin yesterday. Heparin will be discontinued when INR at therapeutic range.  Recommendations:  Will check serum LFT, amylase and lipase in a.m. Agree with plans to wean him off TPN.

## 2015-10-16 NOTE — Progress Notes (Signed)
Patient up to chair at 0730. Tolerated well. Back to bed at this time.

## 2015-10-17 LAB — GLUCOSE, CAPILLARY
GLUCOSE-CAPILLARY: 105 mg/dL — AB (ref 65–99)
GLUCOSE-CAPILLARY: 90 mg/dL (ref 65–99)
Glucose-Capillary: 109 mg/dL — ABNORMAL HIGH (ref 65–99)
Glucose-Capillary: 123 mg/dL — ABNORMAL HIGH (ref 65–99)
Glucose-Capillary: 127 mg/dL — ABNORMAL HIGH (ref 65–99)
Glucose-Capillary: 132 mg/dL — ABNORMAL HIGH (ref 65–99)

## 2015-10-17 LAB — BASIC METABOLIC PANEL
ANION GAP: 5 (ref 5–15)
BUN: 13 mg/dL (ref 6–20)
CALCIUM: 8.6 mg/dL — AB (ref 8.9–10.3)
CHLORIDE: 107 mmol/L (ref 101–111)
CO2: 23 mmol/L (ref 22–32)
CREATININE: 0.91 mg/dL (ref 0.61–1.24)
GFR calc non Af Amer: >60 mL/min (ref >60)
Glucose, Bld: 117 mg/dL — ABNORMAL HIGH (ref 65–99)
Potassium: 4 mmol/L (ref 3.5–5.1)
SODIUM: 135 mmol/L (ref 135–145)

## 2015-10-17 LAB — LIPASE, BLOOD: LIPASE: 56 U/L — AB (ref 11–51)

## 2015-10-17 LAB — HEPATIC FUNCTION PANEL
ALT: 85 U/L — AB (ref 17–63)
AST: 43 U/L — AB (ref 15–41)
Albumin: 2.4 g/dL — ABNORMAL LOW (ref 3.5–5.0)
Alkaline Phosphatase: 150 U/L — ABNORMAL HIGH (ref 38–126)
BILIRUBIN DIRECT: 0.4 mg/dL (ref 0.1–0.5)
BILIRUBIN INDIRECT: 1 mg/dL — AB (ref 0.3–0.9)
TOTAL PROTEIN: 6.2 g/dL — AB (ref 6.5–8.1)
Total Bilirubin: 1.4 mg/dL — ABNORMAL HIGH (ref 0.3–1.2)

## 2015-10-17 LAB — CBC
HEMATOCRIT: 29.9 % — AB (ref 39.0–52.0)
HEMOGLOBIN: 10.6 g/dL — AB (ref 13.0–17.0)
MCH: 32.2 pg (ref 26.0–34.0)
MCHC: 35.5 g/dL (ref 30.0–36.0)
MCV: 90.9 fL (ref 78.0–100.0)
Platelets: 297 10*3/uL (ref 150–400)
RBC: 3.29 MIL/uL — ABNORMAL LOW (ref 4.22–5.81)
RDW: 13.7 % (ref 11.5–15.5)
WBC: 12.2 10*3/uL — AB (ref 4.0–10.5)

## 2015-10-17 LAB — PROTIME-INR
INR: 1.51 — AB (ref 0.00–1.49)
Prothrombin Time: 18.3 seconds — ABNORMAL HIGH (ref 11.6–15.2)

## 2015-10-17 LAB — AMYLASE: AMYLASE: 51 U/L (ref 28–100)

## 2015-10-17 LAB — HEPARIN LEVEL (UNFRACTIONATED): HEPARIN UNFRACTIONATED: 0.45 [IU]/mL (ref 0.30–0.70)

## 2015-10-17 MED ORDER — WARFARIN SODIUM 5 MG PO TABS
7.5000 mg | ORAL_TABLET | Freq: Once | ORAL | Status: AC
  Administered 2015-10-17: 7.5 mg via ORAL
  Filled 2015-10-17: qty 2

## 2015-10-17 MED ORDER — INSULIN ASPART 100 UNIT/ML ~~LOC~~ SOLN
0.0000 [IU] | Freq: Three times a day (TID) | SUBCUTANEOUS | Status: DC
  Administered 2015-10-17 – 2015-10-18 (×2): 2 [IU] via SUBCUTANEOUS

## 2015-10-17 NOTE — Progress Notes (Signed)
[facility] --> Warfarin Indication: Splenic Infarct, VTE treatment  Allergies  Allergen Reactions  . Allopurinol Other (See Comments)    Significantly decreased WBC's  . Colchicine Other (See Comments)    Significantly decreased WBC's    Patient Measurements: Height: 5\' 5"  (165.1 cm) Weight: 167 lb 8.8 oz (76 kg) IBW/kg (Calculated) : 61.5 HEPARIN DW (KG): 76.6   Vital Signs: Temp: 99.3 F (37.4 C) (11/06 0636) Temp Source: Oral (11/06 0636) BP: 136/55 mmHg (11/06 0636) Pulse Rate: 57 (11/06 0636)  Labs:  Recent Labs  10/15/15 0343  10/16/15 0743 10/16/15 1745 10/17/15 0741  HGB 10.9*  --  11.8*  --  10.6*  HCT 31.1*  --  33.0*  --  29.9*  PLT 266  --  296  --  297  LABPROT 16.0*  --  15.7*  --  18.3*  INR 1.26  --  1.23  --  1.51*  HEPARINUNFRC 0.16*  < > 0.27* 0.47 0.45  CREATININE 0.79  --  0.79  --  0.91  < > = values in this interval not displayed.  Estimated Creatinine Clearance: 69.8 mL/min (by C-G formula based on Cr of 0.91).  Medical History: Past Medical History  Diagnosis Date  . Hypertension   . Heart murmur   . ED (erectile dysfunction)   . Agranulocytosis ([place])   . Diabetes mellitus   . Hyperlipidemia   . Gout   . Adenomatous colon polyp   . Prostate cancer ([place])     Followed by Dr. Jeffie Pollock    Medications:  Scheduled:  . amLODipine  10 mg Oral Daily  . benazepril  40 mg Oral Daily  . hydrALAZINE  50 mg Oral TID  .  HYDROmorphone (DILAUDID) injection  1 mg Intravenous 3 times per day  . insulin aspart  0-15 Units Subcutaneous 6 times per day  . metoprolol tartrate  50 mg Oral BID  . pantoprazole (PROTONIX) IV  40 mg Intravenous Q12H  . sodium chloride  10-40 mL Intracatheter Q12H  . Warfarin - Pharmacist Dosing Inpatient   Does not apply Q24H   Assessment: Okay for Protocol, OK for systemic AC per GI service.  Acute pancreatitis on admission and cholecystectomy planned eventually but  likely not this admission per TR[facility].  CBC stable, Heparin level at goal, INR rising toward goal.  No bleeding noted.  Goal of Therapy:  INR 2-3 Heparin level 0.3-0.7 units/ml Monitor platelets by anticoagulation protocol: Yes   Plan:  Continue heparin infusion at 1700 units per hour anti-Xa level daily while on heparin Coumadin 7.5 mg po x 1 dose today Continue to monitor H&H and platelets  Daily PT/INR  Abner Greenspan, Finnleigh Marchetti Bennett 10/17/2015,8:33 AM

## 2015-10-17 NOTE — Progress Notes (Signed)
  Subjective: Patient continues to feel well. He denies abdominal pain. He also denies nausea or vomiting. He is passing flatus. He has not had a bowel movement today. He denies chest pain or shortness of breath.   Objective: Blood pressure 155/63, pulse 61, temperature 99.3 F (37.4 C), temperature source Oral, resp. rate 20, height 5\' 5"  (1.651 m), weight 167 lb 8.8 oz (76 kg), SpO2 99 %. Patient is alert and appears to be comfortable. Cardiac exam with regular rhythm normal S1 and S2. No murmur or gallop noted. Lungs are clear to auscultation. Abdomen is symmetrical. He has small umbilicus hernia and a birthmark at left mid abdomen. Bowel sounds are normal. On palpation abdomen is soft with mild tenderness in right upper quadrant. No organomegaly or masses. No LE edema or clubbing noted.  Labs/studies Results:   Recent Labs  10/15/15 0343 10/16/15 0743 10/17/15 0741  WBC 9.9 11.6* 12.2*  HGB 10.9* 11.8* 10.6*  HCT 31.1* 33.0* 29.9*  PLT 266 296 297    BMET   Recent Labs  10/15/15 0343 10/16/15 0743 10/17/15 0741  NA 137 135 135  K 3.3* 3.6 4.0  CL 110 108 107  CO2 20* 19* 23  GLUCOSE 143* 142* 117*  BUN 12 12 13   CREATININE 0.79 0.79 0.91  CALCIUM 8.2* 8.7* 8.6*    LFT   Recent Labs  10/15/15 0343 10/16/15 0743 10/17/15 0741  PROT 5.6* 6.5 6.2*  ALBUMIN 2.1* 2.5* 2.4*  AST 70* 67* 43*  ALT 99* 112* 85*  ALKPHOS 133* 159* 150*  BILITOT 2.0* 1.9* 1.4*  BILIDIR  --   --  0.4  IBILI  --   --  1.0*    PT/INR   Recent Labs  10/16/15 0743 10/17/15 0741  LABPROT 15.7* 18.3*  INR 1.23 1.51*     Assessment:  #1. Biliary pancreatitis. Severe pancreatitis with fluid collection and necrosis. Patient is tolerating heart healthy diet. Abdominal exam is relatively benign.  Transaminases continue to trend downwards. If there is bump in transaminases will proceed with MRCP looking for common duct stone. He is still requiring pain medication. He was given 3  doses of hydromorphone yesterday and one this morning. He will need cholecystectomy at a later date. #2. Anemia secondary to acute illness. No evidence of overt GI bleed. #3. 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 Patient has been treated with heparin and was begun on warfarin 2 days ago. INR is still subtherapeutic.   Recommendations:  LFTs in a.m.

## 2015-10-17 NOTE — Progress Notes (Signed)
[facility] PROGRESS NOTE  Alan [name]. EPP:[id number] DOB: 01-10-1941 DOA: 10/07/2015 PCP: Wardell Honour, MD  Assessment/Plan: 1. 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 Lipase WNL. Patient briefly had NJ tube for nutrition. Unfortunately this was dislodged on 11/2. Subsequently, PICC line was placed for TPN.  GI input appreciated. Patient is now tolerating a solid diet without any n/v or pain. Since advanced diet was tolerated, TPN was discontinued 11/5. PICC line removal when ready for discharge. Patient will need outpatient follow up with general surgeon for cholecystectomy. Liver enzymes are trending down.  2. 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 Continue anticoagulation with Heparin and Coumadin. INR 1.51 today, continue to monitor.  GI is in agreement. Novel oral anticoagulants are cost-prohibitive. 3. Hypokalemia, Repleted.  4. DM type 2, stable. Continue SSI 5. Essential HTN, blood pressures stable. Will continue PRN hydralazine 6. Prostate CA.  Followed by urology in [place].  7. HLD, statin currently on hold.   8. Gout, no evidence of acute flares.  Code Status: Full DVT prophylaxis: SCDs Family Communication:  Discussed care plan with patient who understands and has no concerns at this time. Disposition Plan: Anticipate discharge in 1-2 days.    Consultants:  GI  PT- HH  Procedures:  NJ tube 10/31>>11/2  PICC line 11/2>>  Antibiotics:    HPI/Subjective: Feels good. Eating breakfast without any nausea, vomiting, or pain. Ambulated twice yesterday without difficulty. Has not had a BM today but is urinating well.   Objective: Filed Vitals:   10/17/15 0636  BP: 136/55  Pulse: 57  Temp: 99.3 F (37.4 C)  Resp: 20    Intake/Output Summary (Last 24 hours) at 10/17/15 0744 Last data filed at 10/16/15 1715  Gross per 24 hour  Intake 3718.97 ml  Output    750 ml  Net 2968.97 ml   Filed Weights   10/15/15 0805  10/16/15 0531 10/17/15 0636  Weight: 76.461 kg (168 lb 9.1 oz) 75.6 kg (166 lb 10.7 oz) 76 kg (167 lb 8.8 oz)    Exam:   General: Appears calm and comfortable, sitting up in bed eating breakfast  Cardiovascular: Regular rate and rhythm, no m/r/g  Respiratory: Diminished breath sounds at bases. No w/r/r  Abdomen: soft, ntnd , bowel sounds present  Musculoskeletal: No edema BLE.   Data Reviewed: Basic Metabolic Panel:  Recent Labs Lab 10/11/15 0844 10/12/15 1001 10/14/15 0516 10/15/15 0343 10/16/15 0743  NA 139 141 139 137 135  K 3.2* 3.3* 3.1* 3.3* 3.6  CL 110 112* 113* 110 108  CO2 23 22 21* 20* 19*  GLUCOSE 141* 121* 156* 143* 142*  BUN 15 14 12 12 12   CREATININE 0.82 0.90 0.88 0.79 0.79  CALCIUM 8.3* 8.2* 8.2* 8.2* 8.7*  MG  --   --  2.0 1.9 1.9  PHOS  --   --  3.3 3.8 3.6   Liver Function Tests:  Recent Labs Lab 10/11/15 0844 10/12/15 1001 10/14/15 0516 10/15/15 0343 10/16/15 0743  AST 59* 78* 50* 70* 67*  ALT 103* 122* 91* 99* 112*  ALKPHOS 97 108 143* 133* 159*  BILITOT 3.2* 3.2* 2.0* 2.0* 1.9*  PROT 6.0* 5.7* 5.5* 5.6* 6.5  ALBUMIN 2.4* 2.2* 2.1* 2.1* 2.5*    Recent Labs Lab 10/11/15 0844  LIPASE 39   CBC:  Recent Labs Lab 10/10/15 1321 10/11/15 0844 10/12/15 1001 10/14/15 0516 10/15/15 0343 10/16/15 0743  WBC 12.0* 10.8* 10.2 9.7 9.9 11.6*  NEUTROABS 10.2*  --   --  7.6  --  9.3*  HGB 12.3* 12.1* 11.7* 10.7* 10.9* 11.8*  HCT 35.2* 34.8* 33.0* 31.2* 31.1* 33.0*  MCV 91.4 91.3 90.7 91.2 91.2 89.2  PLT 206 210 198 256 266 296    CBG:  Recent Labs Lab 10/16/15 1136 10/16/15 1545 10/16/15 1959 10/17/15 0035 10/17/15 0410  GLUCAP 135* 123* 133* 127* 105*     Scheduled Meds: . amLODipine  10 mg Oral Daily  . benazepril  40 mg Oral Daily  . hydrALAZINE  50 mg Oral TID  .  HYDROmorphone (DILAUDID) injection  1 mg Intravenous 3 times per day  . insulin aspart  0-15 Units Subcutaneous 6 times per day  . metoprolol tartrate   50 mg Oral BID  . pantoprazole (PROTONIX) IV  40 mg Intravenous Q12H  . sodium chloride  10-40 mL Intracatheter Q12H  . Warfarin - Pharmacist Dosing Inpatient   Does not apply Q24H   Continuous Infusions: . heparin 1,700 Units/hr (10/16/15 2310)    Active Problems:   HTN (hypertension)   HLD (hyperlipidemia)   Diabetes ([facility]CC)   Gout   Prostate cancer ([facility])   Pancreatitis   Obstructive jaundice   Elevated transaminase level   Hypercalcemia   Hypokalemia   Acute pancreatitis   Elevated liver function tests   Elevated bilirubin   Abdominal pain   Pancreatitis, acute   Encounter for feeding tube placement   Encounter for nasogastric (NG) tube placement    Time spent: 15 minutes   Kimon Loewen. MD  [facility] Pager [phone]. If 7PM-7AM, please contact night-coverage at [email or web address], password [facility] 10/17/2015, 7:44 AM  LOS: 10 days    By signing my name below, I, Rosalie Doctor, attest that this documentation has been prepared under the direction and in the presence of [employer]. MD Electronically Signed: Rosalie Doctor, Scribe. 10/17/2015 9:24am   I, Dr. Kathie Dike, personally performed the services described in this documentaiton. All medical record entries made by the scribe were at my direction and in my presence. I have reviewed the chart and agree that the record reflects my personal performance and is accurate and complete  Kathie Dike, MD, 10/17/2015 9:29 AM

## 2015-10-18 ENCOUNTER — Other Ambulatory Visit: Payer: Self-pay

## 2015-10-18 ENCOUNTER — Telehealth: Payer: Self-pay | Admitting: Gastroenterology

## 2015-10-18 DIAGNOSIS — K858 Other acute pancreatitis without necrosis or infection: Secondary | ICD-10-CM

## 2015-10-18 LAB — COMPREHENSIVE METABOLIC PANEL
ALK PHOS: 145 U/L — AB (ref 38–126)
ALT: 68 U/L — ABNORMAL HIGH (ref 17–63)
ANION GAP: 10 (ref 5–15)
AST: 34 U/L (ref 15–41)
Albumin: 2.5 g/dL — ABNORMAL LOW (ref 3.5–5.0)
BILIRUBIN TOTAL: 1.3 mg/dL — AB (ref 0.3–1.2)
BUN: 12 mg/dL (ref 6–20)
CALCIUM: 8.8 mg/dL — AB (ref 8.9–10.3)
CO2: 20 mmol/L — ABNORMAL LOW (ref 22–32)
Chloride: 107 mmol/L (ref 101–111)
Creatinine, Ser: 0.99 mg/dL (ref 0.61–1.24)
GFR calc non Af Amer: >60 mL/min (ref >60)
GLUCOSE: 117 mg/dL — AB (ref 65–99)
Potassium: 3.7 mmol/L (ref 3.5–5.1)
Sodium: 137 mmol/L (ref 135–145)
TOTAL PROTEIN: 6.4 g/dL — AB (ref 6.5–8.1)

## 2015-10-18 LAB — CBC
HEMATOCRIT: 31.5 % — AB (ref 39.0–52.0)
HEMOGLOBIN: 11.1 g/dL — AB (ref 13.0–17.0)
MCH: 32.2 pg (ref 26.0–34.0)
MCHC: 35.2 g/dL (ref 30.0–36.0)
MCV: 91.3 fL (ref 78.0–100.0)
Platelets: 347 10*3/uL (ref 150–400)
RBC: 3.45 MIL/uL — ABNORMAL LOW (ref 4.22–5.81)
RDW: 13.9 % (ref 11.5–15.5)
WBC: 10.2 10*3/uL (ref 4.0–10.5)

## 2015-10-18 LAB — DIFFERENTIAL
BASOS ABS: 0 10*3/uL (ref 0.0–0.1)
BASOS PCT: 0 %
EOS ABS: 0.1 10*3/uL (ref 0.0–0.7)
EOS PCT: 1 %
Lymphocytes Relative: 14 %
Lymphs Abs: 1.5 10*3/uL (ref 0.7–4.0)
MONOS PCT: 10 %
Monocytes Absolute: 1 10*3/uL (ref 0.1–1.0)
Neutro Abs: 7.7 10*3/uL (ref 1.7–7.7)
Neutrophils Relative %: 75 %

## 2015-10-18 LAB — GLUCOSE, CAPILLARY
GLUCOSE-CAPILLARY: 118 mg/dL — AB (ref 65–99)
Glucose-Capillary: 118 mg/dL — ABNORMAL HIGH (ref 65–99)
Glucose-Capillary: 119 mg/dL — ABNORMAL HIGH (ref 65–99)
Glucose-Capillary: 140 mg/dL — ABNORMAL HIGH (ref 65–99)

## 2015-10-18 LAB — PROTIME-INR
INR: 1.85 — AB (ref 0.00–1.49)
PROTHROMBIN TIME: 21.3 s — AB (ref 11.6–15.2)

## 2015-10-18 LAB — HEPARIN LEVEL (UNFRACTIONATED): HEPARIN UNFRACTIONATED: 0.45 [IU]/mL (ref 0.30–0.70)

## 2015-10-18 MED ORDER — WARFARIN SODIUM 2 MG PO TABS
4.0000 mg | ORAL_TABLET | Freq: Once | ORAL | Status: AC
  Administered 2015-10-18: 4 mg via ORAL
  Filled 2015-10-18: qty 2

## 2015-10-18 NOTE — Telephone Encounter (Signed)
Patient is hospitalized currently with biliary pancreatitis. He will be discharged soon. We need to go ahead and get him an appt with Dr. Arnoldo Morale in the next week or so. Needs to be sooner than later

## 2015-10-18 NOTE — Progress Notes (Signed)
    Subjective: No abdominal pain. Tolerating diet. No N/V.   Objective: Vital signs in last 24 hours: Temp:  [97.5 F (36.4 C)-98.2 F (36.8 C)] 97.5 F (36.4 C) (11/07 0618) Pulse Rate:  [58-65] 58 (11/07 0618) Resp:  [18-20] 20 (11/07 0618) BP: (136-158)/(52-63) 158/59 mmHg (11/07 0618) SpO2:  [95 %-97 %] 97 % (11/07 0618) Weight:  [167 lb 15.9 oz (76.2 kg)] 167 lb 15.9 oz (76.2 kg) (11/07 0618) Last BM Date: 10/16/15 General:   Alert and oriented, pleasant Head:  Normocephalic and atraumatic. Eyes:  No icterus, sclera clear. Conjuctiva pink.  Abdomen:  Bowel sounds present, soft, non-tender, non-distended. Small umbilical hernia Neurologic:  Alert and  oriented x4;  grossly normal neurologically. Psych:  Alert and cooperative. Normal mood and affect.  Intake/Output from previous day: 11/06 0701 - 11/07 0700 In: 1864.9 [P.O.:720; I.V.:1144.9] Out: 1650 [Urine:1650] Intake/Output this shift:    Lab Results:  Recent Labs  10/16/15 0743 10/17/15 0741 10/18/15 0517  WBC 11.6* 12.2* 10.2  HGB 11.8* 10.6* 11.1*  HCT 33.0* 29.9* 31.5*  PLT 296 297 347   BMET  Recent Labs  10/16/15 0743 10/17/15 0741 10/18/15 0517  NA 135 135 137  K 3.6 4.0 3.7  CL 108 107 107  CO2 19* 23 20*  GLUCOSE 142* 117* 117*  BUN 12 13 12   CREATININE 0.79 0.91 0.99  CALCIUM 8.7* 8.6* 8.8*   LFT  Recent Labs  10/16/15 0743 10/17/15 0741 10/18/15 0517  PROT 6.5 6.2* 6.4*  ALBUMIN 2.5* 2.4* 2.5*  AST 67* 43* 34  ALT 112* 85* 68*  ALKPHOS 159* 150* 145*  BILITOT 1.9* 1.4* 1.3*  BILIDIR  --  0.4  --   IBILI  --  1.0*  --    PT/INR  Recent Labs  10/17/15 0741 10/18/15 0517  LABPROT 18.3* 21.3*  INR 1.51* 1.85*    Assessment: 74 year old male admitted with acute biliary pancreatitis complicated by necrosis 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 Tolerating diet. On Heparin drip and Coumadin. Hopeful  discharge home soon once INR therapeutic.   Anemia: multifactorial in setting of acute illness without overt GI bleeding. Stable.   Elevated LFTs: without obstruction and continuing to improve.   Plan: Outpatient evaluation for cholecystectomy Close follow-up as outpatient Continue low fat diet Anticoagulation per pharmacy  Will follow peripherally.     LOS: 11 days    10/18/2015, 7:58 AM  Attending note:  Patient seen and examined. He is now much improved. He is tolerating a regular diet. He is agreeable to seeing Dr. Arnoldo Morale for cholecystectomy. My office will be making that appointment in the next couple of weeks.

## 2015-10-18 NOTE — Progress Notes (Signed)
Physical Therapy Treatment Patient Details Name: Alan [name]. MRN: [id number] DOB: [birth year] Today's Date: 10/18/2015    History of Present Illness Pt is a 74yo black male who came to [facility] after N/V diahrrea, and RUQ pain at home. Pt found to have acute pancreatitis. At eval, all symptoms are well managed.     PT Comments    Pt is progressing extremely well.  He reports no pain/malaise and is eager to work with PT.  He was able to tolerate resisted LE exercise for both LEs with no significant fatigue.  His walking now is stable enough with a walker that I instructed him in gait with a cane and he was able to ambulate 180' with good stability.  He thinks that he still has a cane at home.  He would still like for HHPT to begin after d/c.  He should transition very well to home.  Follow Up Recommendations  Home health PT     Equipment Recommendations  None recommended by PT    Recommendations for Other Services  none     Precautions / Restrictions Precautions Precautions: None Restrictions Weight Bearing Restrictions: No    Mobility  Bed Mobility Overal bed mobility: Independent                Transfers Overall transfer level: Independent               General transfer comment: pt able to stand from sitting without hand held assist  Ambulation/Gait Ambulation/Gait assistance: Supervision Ambulation Distance (Feet): 180 Feet Assistive device: Straight cane     Gait velocity interpretation: >2.62 ft/sec, indicative of independent community ambulator General Gait Details: now able to walk with cane, good stability   Stairs            Wheelchair Mobility    Modified Rankin (Stroke Patients Only)       Balance Overall balance assessment: Independent                                  Cognition Arousal/Alertness: Awake/alert   Overall Cognitive Status: Within Functional Limits for tasks assessed                       Exercises General Exercises - Lower Extremity Ankle Circles/Pumps: AROM;Strengthening;Both;10 reps;Seated Quad Sets: AROM;Both;20 reps;Seated Gluteal Sets: AROM;Both;20 reps;Seated Long Arc Quad: Strengthening;Both;15 reps;Seated Straight Leg Raises: AROM;Both;10 reps;Seated Other Exercises Other Exercises: sit to stand without UE assist    General Comments        Pertinent Vitals/Pain Pain Assessment: No/denies pain    Home Living                      Prior Function            PT Goals (current goals can now be found in the care plan section) Progress towards PT goals: Progressing toward goals    Frequency  Min 3X/week    PT Plan Current plan remains appropriate    Co-evaluation             End of Session Equipment Utilized During Treatment: Gait belt Activity Tolerance: Patient tolerated treatment well Patient left: in chair;with call bell/phone within reach;with family/visitor present     Time: 2353-6144 PT Time Calculation (min) (ACUTE ONLY): 29 min  Charges:  $Gait Training: 8-22 mins $Therapeutic Exercise: 8-22 mins  G CodesSable Feil  PT 10/18/2015, 1:38 PM [phone]

## 2015-10-18 NOTE — Progress Notes (Signed)
[facility] PROGRESS NOTE  [name] BTD:[id number] DOB: 02-Oct-1941 DOA: 10/07/2015 PCP: Wardell Honour, MD  Assessment/Plan: 1. 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 Lipase WNL. Patient briefly had NJ tube for nutrition. Unfortunately this was dislodged on 11/2. Subsequently, PICC line was placed for TPN.  GI input appreciated. Patient is now tolerating a solid diet without any n/v or pain. Since advanced diet was tolerated, TPN was discontinued 11/5. PICC line removal when ready for discharge.  2. 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 Continue anticoagulation with Heparin and Coumadin. INR has trended up to 1.85, today.We anticipate it should be above goal tomorrow. Once INR is greater then 2, will discontinue Heparin. Will continue to monitor. Novel oral anticoagulants are cost-prohibitive. Appreciate GI consult.  3. Hypokalemia, Repleted.  4. DM type 2, stable. Continue SSI 5. Essential HTN, blood pressures stable. Will continue PRN hydralazine 6. Prostate CA.  Followed by urology in [place].  7. HLD, statin currently on hold.   8. Gout, stable no evidence of acute flares.  Code Status: Full DVT prophylaxis: SCDs Family Communication:  Discussed care plan with patient who understands and has no concerns at this time. Disposition Plan: Anticipate discharge within 24 hours.    Consultants:  GI  PT- HH  Procedures:  NJ tube 10/31>>11/2  PICC line 11/2>>  Antibiotics:    HPI/Subjective: Feeling good.Denies chest pain, shortness of breath, n/v/d, or abd pain.   Objective: Filed Vitals:   10/18/15 0618  BP: 158/59  Pulse: 58  Temp: 97.5 F (36.4 C)  Resp: 20    Intake/Output Summary (Last 24 hours) at 10/18/15 0744 Last data filed at 10/18/15 3646  Gross per 24 hour  Intake 1864.93 ml  Output   1650 ml  Net 214.93 ml   Filed Weights   10/16/15 0531 10/17/15 0636 10/18/15 0618  Weight: 75.6 kg (166 lb 10.7 oz)  76 kg (167 lb 8.8 oz) 76.2 kg (167 lb 15.9 oz)    Exam:   General: NAD, looks comfortable  Cardiovascular: RRR, S1, S2   Respiratory: clear bilaterally, No wheezing, rales or rhonchi  Abdomen: soft, non tender, no distention , bowel sounds normal  Musculoskeletal: No edema b/l  Data Reviewed: Basic Metabolic Panel:  Recent Labs Lab 10/14/15 0516 10/15/15 0343 10/16/15 0743 10/17/15 0741 10/18/15 0517  NA 139 137 135 135 137  K 3.1* 3.3* 3.6 4.0 3.7  CL 113* 110 108 107 107  CO2 21* 20* 19* 23 20*  GLUCOSE 156* 143* 142* 117* 117*  BUN 12 12 12 13 12   CREATININE 0.88 0.79 0.79 0.91 0.99  CALCIUM 8.2* 8.2* 8.7* 8.6* 8.8*  MG 2.0 1.9 1.9  --   --   PHOS 3.3 3.8 3.6  --   --    Liver Function Tests:  Recent Labs Lab 10/14/15 0516 10/15/15 0343 10/16/15 0743 10/17/15 0741 10/18/15 0517  AST 50* 70* 67* 43* 34  ALT 91* 99* 112* 85* 68*  ALKPHOS 143* 133* 159* 150* 145*  BILITOT 2.0* 2.0* 1.9* 1.4* 1.3*  PROT 5.5* 5.6* 6.5 6.2* 6.4*  ALBUMIN 2.1* 2.1* 2.5* 2.4* 2.5*    Recent Labs Lab 10/11/15 0844 10/17/15 0741  LIPASE 39 56*  AMYLASE  --  51   CBC:  Recent Labs Lab 10/14/15 0516 10/15/15 0343 10/16/15 0743 10/17/15 0741 10/18/15 0517  WBC 9.7 9.9 11.6* 12.2* 10.2  NEUTROABS 7.6  --  9.3*  --  7.7  HGB 10.7*  10.9* 11.8* 10.6* 11.1*  HCT 31.2* 31.1* 33.0* 29.9* 31.5*  MCV 91.2 91.2 89.2 90.9 91.3  PLT 256 266 296 297 347    CBG:  Recent Labs Lab 10/17/15 0410 10/17/15 0757 10/17/15 1212 10/17/15 1659 10/17/15 2146  GLUCAP 105* 109* 132* 90 123*     Scheduled Meds: . amLODipine  10 mg Oral Daily  . benazepril  40 mg Oral Daily  . hydrALAZINE  50 mg Oral TID  .  HYDROmorphone (DILAUDID) injection  1 mg Intravenous 3 times per day  . insulin aspart  0-15 Units Subcutaneous TID AC & HS  . metoprolol tartrate  50 mg Oral BID  . pantoprazole (PROTONIX) IV  40 mg Intravenous Q12H  . sodium chloride  10-40 mL Intracatheter Q12H  .  Warfarin - Pharmacist Dosing Inpatient   Does not apply Q24H   Continuous Infusions: . heparin 1,700 Units/hr (10/18/15 0457)    Active Problems:   HTN (hypertension)   HLD (hyperlipidemia)   Diabetes (HCC)   Gout   Prostate cancer (HCC)   Pancreatitis   Obstructive jaundice   Elevated transaminase level   Hypercalcemia   Hypokalemia   Acute pancreatitis   Elevated liver function tests   Elevated bilirubin   Abdominal pain   Pancreatitis, acute   Encounter for feeding tube placement   Encounter for nasogastric (NG) tube placement    Time spent: 15 minutes   Jehanzeb Memon. MD  [facility] Pager [phone]. If 7PM-7AM, please contact night-coverage at [email or web address], password [facility] 10/18/2015, 7:44 AM  LOS: 11 days    By signing my name below, I, Rennis Harding, attest that this documentation has been prepared under the direction and in the presence of Kathie Dike, MD. Electronically signed: Rennis Harding, Scribe. 10/18/2015 10:09am     I, Dr. Kathie Dike, personally performed the services described in this documentaiton. All medical record entries made by the scribe were at my direction and in my presence. I have reviewed the chart and agree that the record reflects my personal performance and is accurate and complete  Kathie Dike, MD, 10/18/2015 10:22 AM

## 2015-10-18 NOTE — Progress Notes (Signed)
[place] for Heparin --> Warfarin Indication: Splenic Infarct, VTE treatment  Allergies  Allergen Reactions  . Allopurinol Other (See Comments)    Significantly decreased WBC's  . Colchicine Other (See Comments)    Significantly decreased WBC's    Patient Measurements: Height: 5\' 5"  (165.1 cm) Weight: 167 lb 15.9 oz (76.2 kg) IBW/kg (Calculated) : 61.5 HEPARIN DW (KG): 76.6   Vital Signs: Temp: 97.5 F (36.4 C) (11/07 0618) Temp Source: Oral (11/07 0618) BP: 158/59 mmHg (11/07 0618) Pulse Rate: 58 (11/07 0618)  Labs:  Recent Labs  10/16/15 0743 10/16/15 1745 10/17/15 0741 10/18/15 0517  HGB 11.8*  --  10.6* 11.1*  HCT 33.0*  --  29.9* 31.5*  PLT 296  --  297 347  LABPROT 15.7*  --  18.3* 21.3*  INR 1.23  --  1.51* 1.85*  HEPARINUNFRC 0.27* 0.47 0.45 0.45  CREATININE 0.79  --  0.91 0.99    Estimated Creatinine Clearance: 64.3 mL/min (by C-G formula based on Cr of 0.99).  Medical History: Past Medical History  Diagnosis Date  . Hypertension   . Heart murmur   . ED (erectile dysfunction)   . Agranulocytosis ([place])   . Diabetes mellitus   . Hyperlipidemia   . Gout   . Adenomatous colon polyp   . Prostate cancer ([place])     Followed by Dr. Jeffie Pollock    Medications:  Scheduled:  . amLODipine  10 mg Oral Daily  . benazepril  40 mg Oral Daily  . hydrALAZINE  50 mg Oral TID  .  HYDROmorphone (DILAUDID) injection  1 mg Intravenous 3 times per day  . insulin aspart  0-15 Units Subcutaneous TID AC & HS  . metoprolol tartrate  50 mg Oral BID  . pantoprazole (PROTONIX) IV  40 mg Intravenous Q12H  . sodium chloride  10-40 mL Intracatheter Q12H  . warfarin  4 mg Oral Once  . Warfarin - Pharmacist Dosing Inpatient   Does not apply Q24H   Assessment: 74 yo man on heparin>>coumadin for splenic vein thrombosis.  Heparin level remains therapeutic.  INR today is trending up to 1.85. CBC stable. No bleeding noted.  Goal of Therapy:   INR 2-3 Heparin level 0.3-0.7 units/ml Monitor platelets by anticoagulation protocol: Yes   Plan:  Continue heparin infusion at 1700 units per hour anti-Xa level daily while on heparin Coumadin 4 mg po x 1 dose today Continue to monitor H&H and platelets  Daily PT/INR  Laycie Schriner Poteet 10/18/2015,8:43 AM

## 2015-10-18 NOTE — Telephone Encounter (Signed)
referral has been made  

## 2015-10-19 LAB — CBC
HCT: 32.5 % — ABNORMAL LOW (ref 39.0–52.0)
Hemoglobin: 11 g/dL — ABNORMAL LOW (ref 13.0–17.0)
MCH: 31.4 pg (ref 26.0–34.0)
MCHC: 33.8 g/dL (ref 30.0–36.0)
MCV: 92.9 fL (ref 78.0–100.0)
Platelets: 413 K/uL — ABNORMAL HIGH (ref 150–400)
RBC: 3.5 MIL/uL — ABNORMAL LOW (ref 4.22–5.81)
RDW: 14.7 % (ref 11.5–15.5)
WBC: 10.8 K/uL — ABNORMAL HIGH (ref 4.0–10.5)

## 2015-10-19 LAB — HEPARIN LEVEL (UNFRACTIONATED): HEPARIN UNFRACTIONATED: 0.5 [IU]/mL (ref 0.30–0.70)

## 2015-10-19 LAB — GLUCOSE, CAPILLARY
Glucose-Capillary: 106 mg/dL — ABNORMAL HIGH (ref 65–99)
Glucose-Capillary: 119 mg/dL — ABNORMAL HIGH (ref 65–99)

## 2015-10-19 LAB — PROTIME-INR
INR: 2.28 — ABNORMAL HIGH (ref 0.00–1.49)
Prothrombin Time: 24.9 seconds — ABNORMAL HIGH (ref 11.6–15.2)

## 2015-10-19 MED ORDER — WARFARIN SODIUM 5 MG PO TABS: 5.0000 mg | ORAL_TABLET | Freq: Every day | ORAL | Status: DC

## 2015-10-19 MED ORDER — HYDRALAZINE HCL 50 MG PO TABS: 50.0000 mg | ORAL_TABLET | Freq: Three times a day (TID) | ORAL | Status: DC

## 2015-10-19 MED ORDER — WARFARIN SODIUM 2 MG PO TABS: 4.0000 mg | ORAL_TABLET | Freq: Once | ORAL | Status: DC

## 2015-10-19 MED ORDER — ENOXAPARIN SODIUM 120 MG/0.8ML ~~LOC~~ SOLN
110.0000 mg | Freq: Once | SUBCUTANEOUS | Status: AC
  Administered 2015-10-19: 110 mg via SUBCUTANEOUS
  Filled 2015-10-19: qty 0.8

## 2015-10-19 MED ORDER — FUROSEMIDE 40 MG PO TABS: 40.0000 mg | ORAL_TABLET | Freq: Every day | ORAL | Status: DC | PRN

## 2015-10-19 NOTE — Progress Notes (Signed)
Pt PIV removed, tolerated well.  PICC removed by C. Toney Rakes RN, pt tolerated well.  Reviewed discharge instructions with pt and family, answered questions at this time.

## 2015-10-19 NOTE — Care Management Important Message (Signed)
Important Message  Patient Details  Name: Alan [name]. MRN: [id number] Date of Birth: [birth year]   Medicare Important Message Given:  Yes-third notification given    Sherald Barge, RN 10/19/2015, 10:47 AM

## 2015-10-19 NOTE — Care Management Note (Addendum)
Case Management Note  Patient Details  Name: Alan [name]. MRN: [id number] Date of Birth: September 17, 1941  Expected Discharge Date:     10/19/2015             Expected Discharge Plan:  [place]  In-House Referral:  NA  Discharge planning Services  CM Consult  Post Acute Care Choice:  Home Health, Durable Medical Equipment Choice offered to:  Patient  DME Arranged:  [place] rolling DME Agency:  [place]:  PT, RN [facility] Agency:  [place]  Status of Service:  Completed, signed off  Medicare Important Message Given:  Yes-third notification given Date Medicare IM Given:    Medicare IM give by:    Date Additional Medicare IM Given:    Additional Medicare Important Message give by:     If discussed at [place] of Stay Meetings, dates discussed:  10/19/2015  Additional Comments: Pt discharging home today with [facility] RN/PT services. Pt ordered RW and would like DME from [facility]. Romualdo Bolk, [facility] rep made aware of DME referral and DC plan. Vaughan Basta will obtain pt info from chart. Pt aware HH has 48 hours to initiate services. [facility] will deliver pt's RW to room prior to DC. Pt has no further CM needs at this time.  Sherald Barge, RN 10/19/2015, 10:49 AM

## 2015-10-19 NOTE — Progress Notes (Signed)
[place] for Heparin --> Warfarin Indication: Splenic Infarct, VTE treatment  Allergies  Allergen Reactions  . Allopurinol Other (See Comments)    Significantly decreased WBC's  . Colchicine Other (See Comments)    Significantly decreased WBC's    Patient Measurements: Height: 5\' 5"  (165.1 cm) Weight: 158 lb 1.6 oz (71.714 kg) IBW/kg (Calculated) : 61.5 HEPARIN DW (KG): 76.6   Vital Signs: Temp: 98.1 F (36.7 C) (11/08 0330) Temp Source: Oral (11/08 0330) BP: 156/58 mmHg (11/08 0330) Pulse Rate: 56 (11/08 0330)  Labs:  Recent Labs  10/17/15 0741 10/18/15 0517 10/19/15 0528  HGB 10.6* 11.1* 11.0*  HCT 29.9* 31.5* 32.5*  PLT 297 347 413*  LABPROT 18.3* 21.3* 24.9*  INR 1.51* 1.85* 2.28*  HEPARINUNFRC 0.45 0.45 0.50  CREATININE 0.91 0.99  --     Estimated Creatinine Clearance: 58.7 mL/min (by C-G formula based on Cr of 0.99).  Medical History: Past Medical History  Diagnosis Date  . Hypertension   . Heart murmur   . ED (erectile dysfunction)   . Agranulocytosis ([place])   . Diabetes mellitus   . Hyperlipidemia   . Gout   . Adenomatous colon polyp   . Prostate cancer ([place])     Followed by Dr. Jeffie Pollock    Medications:  Scheduled:  . amLODipine  10 mg Oral Daily  . benazepril  40 mg Oral Daily  . hydrALAZINE  50 mg Oral TID  .  HYDROmorphone (DILAUDID) injection  1 mg Intravenous 3 times per day  . insulin aspart  0-15 Units Subcutaneous TID AC & HS  . metoprolol tartrate  50 mg Oral BID  . pantoprazole (PROTONIX) IV  40 mg Intravenous Q12H  . sodium chloride  10-40 mL Intracatheter Q12H  . Warfarin - Pharmacist Dosing Inpatient   Does not apply Q24H   Assessment: 74 yo man on heparin>>coumadin for splenic vein thrombosis.  Heparin level remains therapeutic.  INR today is therapeutic at 2.28. CBC stable. No bleeding noted.   Goal of Therapy:  INR 2-3 Heparin level 0.3-0.7 units/ml Monitor platelets by  anticoagulation protocol: Yes   Plan:  Continue heparin infusion at 1700 units per hour for 24 hrs overlap at therapeutic level and stop in AM anti-Xa level daily while on heparin Coumadin 4 mg po x 1 dose today Continue to monitor H&H and platelets  Daily PT/INR  Isac Sarna, BS Vena [place], BCPS Clinical Pharmacist Pager [phone] 10/19/2015,8:00 AM

## 2015-10-19 NOTE — Discharge Summary (Signed)
Physician Discharge Summary  Alan [name]. LPF:[id number] DOB: 05/15/1941 DOA: 10/07/2015  PCP: Wardell Honour, MD  Admit date: 10/07/2015 Discharge date: 10/19/2015  Time spent: 35 minutes  Recommendations for Outpatient Follow-up:  1. Follow up with GI for outpatient further evaluation of pancreatitis. 2. Patient will be referred to general surgery for cholecystectomy. 3. Home health RN on discharge has been arranged. 4. Follow up with PCP for Coumadin management.   Discharge Diagnoses:  Active Problems:   HTN (hypertension)   HLD (hyperlipidemia)   Diabetes (HCC)   Gout   Prostate cancer (HCC)   Pancreatitis   Obstructive jaundice   Elevated transaminase level   Hypercalcemia   Hypokalemia   Acute pancreatitis   Elevated liver function tests   Elevated bilirubin   Abdominal pain   Pancreatitis, acute   Encounter for feeding tube placement   Encounter for nasogastric (NG) tube placement   Discharge Condition: Improved  Diet recommendation: Heart healthy, low fat diet   Filed Weights   10/17/15 0636 10/18/15 0618 10/19/15 0526  Weight: 76 kg (167 lb 8.8 oz) 76.2 kg (167 lb 15.9 oz) 71.714 kg (158 lb 1.6 oz)    History of present illness:  74 year old male with past medical history of HTN, HLD, DM2, and Gout presented with complaints of RUQ abdominal pain with associated nausea, vomiting, and diarrhea. Evaluation in the ED revealed elevated lipases of 1608 and elevated LFTs. He was admitted for further evaluation and treatment.   Hospital Course:  Acute pancreatitis and pancreatic necrosis with passed stone. Patient was initially admitted to hospital with suspect bilary pancreatitis, he was followed by GI and was treated with IVF, bowel rest, and pain management. CT A/P showed acute necrosis pancreatitis as well as new complication of pancreatic fluid collections, he was also found to have cholestasis. With treatment he has clinically resolved. Patient briefly had  NJ tube for nutrition. Unfortunately this was dislodged on 11/2. Subsequently, PICC line was placed for TPN.His diet was slowly advanced and upon discharge is now tolerating a solid diet without any n/v or pain. TPN was discontinued 11/5. PICC line removal upon discharge. He will be referred to general surgery as an outpatient for cholecystomy. Follow up with GI for further management.   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 During admission he was anticoagulated with Heparin and Coumadin. INR has trended up to goal, 2.28 today. Heparin discontinued, since INR goal met. Novel oral anticoagulants are cost-prohibitive. Appreciate GI consult. Follow up with PCP for further management of Coumadin.  1. Hypokalemia, Repleted.  2. DM type 2, stable. Continue SSI 3. Essential HTN, blood pressures remain elevated. He was continued on his home medications and hydralazine was added.  4. Prostate CA. Followed by urology in [place].  5. HLD, statin were on hold due to elevated LFTs. This can be readdressed in the outpatient setting.   6. Gout, stable no evidence of acute flares  Procedures: 7. NJ tube 10/31>>11/2 8. PICC line 11/2>>11/8  Consultations: 9. GI 10. PT- HH  Discharge Exam: Filed Vitals:   10/19/15 0836  BP: 144/59  Pulse: 66  Temp:   Resp:     11. General: NAD, looks comfortable 12. Cardiovascular: RRR, S1, S2  13. Respiratory: clear bilaterally, No wheezing, rales or rhonchi 14. Abdomen: soft, non tender, no distention , bowel sounds normal 15. Musculoskeletal: No edema b/l  Discharge Instructions   Discharge Instructions    Diet - low sodium heart  healthy    Complete by:  As directed      Increase activity slowly    Complete by:  As directed           Current Discharge Medication List    START taking these medications   Details  hydrALAZINE (APRESOLINE) 50 MG tablet Take 1 tablet (50 mg total) by mouth 3 (three) times daily. Qty: 90  tablet, Refills: 1    warfarin (COUMADIN) 5 MG tablet Take 1 tablet (5 mg total) by mouth daily at 6 PM. Qty: 30 tablet, Refills: 1      CONTINUE these medications which have CHANGED   Details  furosemide (LASIX) 40 MG tablet Take 1 tablet (40 mg total) by mouth daily as needed for fluid. Qty: 30 tablet, Refills: 1   Associated Diagnoses: Essential hypertension, benign      CONTINUE these medications which have NOT CHANGED   Details  amLODipine (NORVASC) 10 MG tablet Take 1 tablet (10 mg total) by mouth every evening. Qty: 90 tablet, Refills: 2    aspirin 81 MG tablet Take 81 mg by mouth every evening.     benazepril (LOTENSIN) 40 MG tablet TAKE ONE TABLET BY MOUTH ONCE DAILY Qty: 90 tablet, Refills: 2    febuxostat (ULORIC) 40 MG tablet Take 2 tablets (80 mg total) by mouth daily. Qty: 60 tablet, Refills: 3    finasteride (PROSCAR) 5 MG tablet Take 1 tablet (5 mg total) by mouth daily. Qty: 90 tablet, Refills: 0   Associated Diagnoses: BPH (benign prostatic hyperplasia)    metFORMIN (GLUCOPHAGE) 500 MG tablet Take 1 tablet (500 mg total) by mouth 2 (two) times daily with a meal. Qty: 180 tablet, Refills: 2   Associated Diagnoses: Type 2 diabetes mellitus without complication (HCC)    metoprolol succinate (TOPROL-XL) 100 MG 24 hr tablet Take 1 tablet (100 mg total) by mouth 1 day or 1 dose. Qty: 90 tablet, Refills: 2   Associated Diagnoses: Essential hypertension    Multiple Vitamins-Minerals (MULTIVITAMIN PO) Take 1 tablet by mouth daily.    niacin (NIASPAN) 1000 MG CR tablet Take 1 tablet (1,000 mg total) by mouth at bedtime. Qty: 30 tablet, Refills: 3   Associated Diagnoses: HLD (hyperlipidemia)      STOP taking these medications     pravastatin (PRAVACHOL) 40 MG tablet        Allergies  Allergen Reactions  . Allopurinol Other (See Comments)    Significantly decreased WBC's  . Colchicine Other (See Comments)    Significantly decreased WBC's   Follow-up  Information    Follow up with [facility].   Contact information:   [address] [place] [address] [phone]       Follow up with Wardell Honour, MD On 10/26/2015.   Specialty:  Family Medicine   Why:  at 9:00am   Contact information:   [place] [address] [phone]        The results of significant diagnostics from this hospitalization (including imaging, microbiology, ancillary and laboratory) are listed below for reference.    Significant Diagnostic Studies: Dg Abd 1 View  10/12/2015  CLINICAL DATA:  Dobbhoff tube -------to see if in jejumum EXAM: ABDOMEN - 1 VIEW COMPARISON:  the previous day's study FINDINGS: Feeding tube tip remains the gastric body. A few gas distended small bowel loops in the mid abdomen. Residual contrast in the nondilated colon, rectum and appendix. Right pelvic vascular calcifications. Regional bones  unremarkable. IMPRESSION: 1. Feeding tube in the gastric body. 2. Nonobstructive bowel gas pattern with residual colonic contrast material. Electronically Signed   By: Lucrezia Europe M.D.   On: 10/12/2015 09:29   US Abdomen Complete  10/07/2015  CLINICAL DATA:  Obstructive jaundice. EXAM: ULTRASOUND ABDOMEN COMPLETE COMPARISON:  Abdomen CT dated 10/07/2015. FINDINGS: Gallbladder: Multiple gallstones in the gallbladder measuring up to 1.7 cm in diameter each. Diffuse gallbladder wall thickening with a maximum thickness of 9 mm. No pericholecystic fluid. The patient was not focally tender over the gallbladder. Common bile duct: Diameter: 9.2 mm Liver: No focal lesion identified. Within normal limits in parenchymal echogenicity. IVC: No abnormality visualized. Pancreas: Poorly visualized. Ill-defined decreased echogenicity in the pancreatic head, similar to the recent CT. Spleen: Size and appearance within normal limits. Right Kidney: Length: 10.4 cm. Echogenicity within normal limits. No mass or hydronephrosis  visualized. Left Kidney: Length: 12.4 cm. Echogenicity within normal limits. No mass or hydronephrosis visualized. Abdominal aorta: No aneurysm visualized. Other findings: None. IMPRESSION: 1. Findings compatible with acute pancreatitis involving the pancreatic head. 2. Cholelithiasis. 3. 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 4. Biliary ductal dilatation, possibly due to a nonvisualized distal common duct stone or stones. This could also be related to the acute pancreatitis. Electronically Signed   By: Claudie Revering M.D.   On: 10/07/2015 15:25   Ct Abdomen Pelvis W Contrast  10/11/2015  CLINICAL DATA:  Upper abdominal pain. Recently diagnosed with acute pancreatitis. Elevated liver function tests. Obstructive cholelithiasis. Prostate cancer. EXAM: CT ABDOMEN AND PELVIS WITH CONTRAST TECHNIQUE: Multidetector CT imaging of the abdomen and pelvis was performed using the standard protocol following bolus administration of intravenous contrast. CONTRAST:  139m OMNIPAQUE IOHEXOL 300 MG/ML  SOLN COMPARISON:  10/07/2015 right upper quadrant abdominal sonogram and CT abdomen/pelvis. FINDINGS: Lower chest: New small bilateral layering pleural effusions with segmental passive atelectasis in both lower lobes. There is atherosclerosis of the coronary arteries, including calcified atherosclerotic plaque in the left main, left circumflex and right coronary arteries. Oral contrast is present in the lower thoracic esophageal lumen, in keeping with esophageal dysmotility and/ gastroesophageal reflux. Hepatobiliary: Normal liver with no liver mass. The nondistended gallbladder contains several calcified gallstones measuring up to 2.0 cm. There is stable mild nonspecific diffuse gallbladder wall thickening without significant pericholecystic fat stranding. No intrahepatic biliary ductal dilatation. Common bile duct measures 6 mm diameter, within normal limits. No radiopaque  choledocholithiasis. Pancreas: There is worsening thickening of the pancreatic head and neck with worsening peripancreatic fat stranding and ill-defined fluid. There is nonenhancement of much of the pancreatic neck. These findings are in keeping with worsening necrotizing acute pancreatitis. There is a new 6.1 x 2.5 cm peripancreatic fluid collection between the distal stomach and pancreatic head (series 2/image 34). There is a new small 2.6 x 1.8 cm peripancreatic fluid collection along the proximal greater curvature of the stomach anterior to the pancreatic tail (2/25). There is new minimal main pancreatic duct dilation in the pancreatic body. No discrete pancreatic mass is detected. Spleen: Normal size. No mass. Adrenals/Urinary Tract: Normal adrenals. Stable subcentimeter hypodense lesion in the lateral upper right kidney, too small to characterize. Stable subcentimeter hypodense lesion in the interpolar left kidney, too small to characterize. Otherwise normal kidneys, with no hydronephrosis. Collapsed and grossly normal bladder. Stomach/Bowel: Stable small hiatal hernia. Otherwise collapsed and grossly normal stomach. Normal caliber small bowel with no small bowel wall thickening. Re- demonstrated is a small umbilical hernia containing  a small bowel loop, with no small bowel wall thickening, pneumatosis or focal small bowel caliber transition. Ascites fluid is also present within the umbilical hernia. Normal appendix. There is moderate distal colonic diverticulosis, most prominent in the sigmoid colon. No large bowel wall thickening or pericolonic fat stranding. Oral contrast traverses to the distal rectum. Vascular/Lymphatic: Atherosclerotic nonaneurysmal abdominal aorta. There is interval development of acute thrombosis of the splenic vein along the entire length of the pancreas, with patency of the splenic vein at the splenic hilum. The superior mesenteric vein and portal veins remain patent. The hepatic and  renal veins remain patent. No pathologically enlarged lymph nodes in the abdomen or pelvis. Reproductive: Mild prostatomegaly. 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 Other: No pneumoperitoneum. Increased small volume ascites in the pericolic gutters and pelvis. Musculoskeletal: No aggressive appearing focal osseous lesions. Moderate degenerative changes in the visualized thoracolumbar spine. Stable small fat containing right inguinal hernia. IMPRESSION: 1. Worsening acute necrotizing pancreatitis, with nonenhancement of much of the pancreatic neck. 2. New complication of splenic vein thrombosis. Portal veins remain patent. 3. New complication of peripancreatic fluid collections as described, which are at risk of becoming pancreatic pseudocysts. Follow-up CT abdomen/pelvis with intravenous contrast is advised in 1 month. 4. Small volume ascites, increased. 5. New small bilateral pleural effusions. 6. Cholelithiasis. Stable mild nonspecific gallbladder wall thickening, favor chronic cholecystitis given the absence of pericholecystic fat stranding. No biliary ductal dilatation. 7. Additional chronic findings as above, including small umbilical hernia containing a small bowel loop, with no evidence of bowel obstruction or bowel incarceration/ischemia. Electronically Signed   By: Ilona Sorrel M.D.   On: 10/11/2015 13:12   Ct Abdomen Pelvis W Contrast  10/07/2015  CLINICAL DATA:  Upper abdominal pain, vomiting and diarrhea after dinner last night. History of diabetes, hyperlipidemia, hypertension and prostate cancer. EXAM: CT ABDOMEN AND PELVIS WITH CONTRAST TECHNIQUE: Multidetector CT imaging of the abdomen and pelvis was performed using the standard protocol following bolus administration of intravenous contrast. CONTRAST:  13m OMNIPAQUE IOHEXOL 300 MG/ML SOLN, 1065mOMNIPAQUE IOHEXOL 300 MG/ML SOLN COMPARISON:  CT abdomen and pelvis January 16, 2007  FINDINGS: LUNG BASES: Included view of the lung bases are clear. The heart is mildly enlarged, no pericardial effusions. Contrast in the distal esophagus associated with small hiatal hernia most consistent with reflux. SOLID ORGANS: At least 2 gallstones measure up to 17 mm with gallbladder wall thickening and pericholecystic inflammation. Patchy hypo enhancement of the pancreatic head, with extensive peripancreatic inflammation, no pseudocyst, pancreatic duct dilatation, pancreatic calcifications or mass. The liver, spleen, and adrenal glands are unremarkable. GASTROINTESTINAL TRACT: The stomach, small and large bowel are normal in course and caliber without inflammatory changes. Wide necked 3 cm umbilical hernia containing a knuckle of small bowel without inflammatory changes or dilatation of the bowel. 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 Normal appendix. KIDNEYS/ URINARY TRACT: Kidneys are orthotopic, demonstrating symmetric enhancement. No nephrolithiasis, hydronephrosis or solid renal masses. The unopacified ureters are normal in course and caliber. Delayed imaging through the kidneys demonstrates symmetric prompt contrast excretion within the proximal urinary collecting system. Urinary bladder is partially distended and unremarkable. PERITONEUM/RETROPERITONEUM: Moderate amount of free fluid in the retroperitoneum, extending into the mesentery, tracking into the pelvis. Aortoiliac vessels are normal in course and caliber, moderate calcific atherosclerosis. No lymphadenopathy by CT size criteria. Mild prostatomegaly. SOFT TISSUE/OSSEOUS STRUCTURES: Non-suspicious. Small fat containing umbilical hernias. Bridging osteophytes the sacroiliac joints. Degenerative  change of lumbar spine resulting in moderate to severe L5-S1 neural foraminal narrowing. IMPRESSION: Acute pancreatitis, hypo enhancing pancreatic head compatible with focal necrotizing pancreatitis. Moderate amount of  retroperitoneal free fluid, no abscess. Cholelithiasis with CT findings of cholecystitis likely reactive from acute pancreatitis. Wide necked 3 cm umbilical hernia containing small bowel without CT findings of incarceration or strangulation, no bowel obstruction . Electronically Signed   By: Elon Alas M.D.   On: 10/07/2015 05:05   Dg Fluoro Rm 1-60 Min  10/12/2015  CLINICAL DATA:  Pancreatitis, feeding tube positioning beyond ligament of Treitz EXAM: FLOURO RM 1-60 MIN: FEEDING TUBE PLACEMENT CONTRAST:  67m OMNIPAQUE IOHEXOL 300 MG/ML  SOLN FLUOROSCOPY TIME:  Radiation Exposure Index (as provided by the fluoroscopic device): Not provided If the device does not provide the exposure index: Fluoroscopy Time (in minutes and seconds):  6 minutes 12 seconds Number of Acquired Images:  4 screen captures during fluoroscopy COMPARISON:  None FINDINGS: Patient's indwelling feeding tube was manipulated under fluoroscopy. A 0.038 guidewire was placed through the tube. The tip was located within the stomach initially. Under fluoroscopic guidance, the tip was manipulated to the distal gastric antrum, across the pylorus, through the duodenum and placed beyond ligament of Treitz. Injection of 20 cc of Omnipaque 300 confirms positioning within proximal jejunum. Procedure tolerated very well by patient. IMPRESSION: Positioning of the feeding tube beyond the ligament of Treitz in the proximal jejunum. Electronically Signed   By: MLavonia DanaM.D.   On: 10/12/2015 11:23   Dg Chest Port 1 View  10/13/2015  CLINICAL DATA:  PICC line placement EXAM: PORTABLE CHEST 1 VIEW COMPARISON:  None. FINDINGS: Right upper extremity PICC line extends into the low SVC. There are small effusions and mild basilar opacities bilaterally. Pulmonary vasculature is normal. IMPRESSION: 1. Satisfactorily positioned right upper extremity PICC line. 2. Small effusions. Adjacent basilar opacities may be atelectatic, but infectious infiltrate cannot  be excluded. Electronically Signed   By: DAndreas NewportM.D.   On: 10/13/2015 22:40   Dg Abd Portable 1v  10/11/2015  CLINICAL DATA:  Status post feeding catheter placement EXAM: PORTABLE ABDOMEN - 1 VIEW COMPARISON:  10/11/2015 1608 hours FINDINGS: Scattered large and small bowel gas is noted. Contrast material is noted throughout the colon from the recent CT examination. A feeding catheter is again seen in the mid stomach and unchanged. No other focal abnormality is noted. IMPRESSION: Stable appearance of feeding catheter when compared with the previous exam Electronically Signed   By: MInez CatalinaM.D.   On: 10/11/2015 18:28   Dg Abd Portable 1v  10/11/2015  CLINICAL DATA:  Nasogastric tube placement. Right upper quadrant abdominal pain. EXAM: PORTABLE ABDOMEN - 1 VIEW COMPARISON:  CT same day FINDINGS: Soft feeding tube enters the stomach and has its tip in the region of the antrum. Previously administered oral contrast present within the colon. Previously administered intravenous contrast is noted in the urinary tract. IMPRESSION: Soft feeding tube tip in the gastric antrum. Electronically Signed   By: MNelson ChimesM.D.   On: 10/11/2015 16:28    Microbiology: No results found for this or any previous visit (from the past 240 hour(s)).   Labs: Basic Metabolic Panel:  Recent Labs Lab 10/14/15 0516 10/15/15 0343 10/16/15 0743 10/17/15 0741 10/18/15 0517  NA 139 137 135 135 137  K 3.1* 3.3* 3.6 4.0 3.7  CL 113* 110 108 107 107  CO2 21* 20* 19* 23 20*  GLUCOSE 156* 143* 142*  117* 117*  BUN '12 12 12 13 12  ' CREATININE 0.88 0.79 0.79 0.91 0.99  CALCIUM 8.2* 8.2* 8.7* 8.6* 8.8*  MG 2.0 1.9 1.9  --   --   PHOS 3.3 3.8 3.6  --   --    Liver Function Tests:  Recent Labs Lab 10/14/15 0516 10/15/15 0343 10/16/15 0743 10/17/15 0741 10/18/15 0517  AST 50* 70* 67* 43* 34  ALT 91* 99* 112* 85* 68*  ALKPHOS 143* 133* 159* 150* 145*  BILITOT 2.0* 2.0* 1.9* 1.4* 1.3*  PROT 5.5*  5.6* 6.5 6.2* 6.4*  ALBUMIN 2.1* 2.1* 2.5* 2.4* 2.5*    Recent Labs Lab 10/17/15 0741  LIPASE 56*  AMYLASE 51  CBC:  Recent Labs Lab 10/14/15 0516 10/15/15 0343 10/16/15 0743 10/17/15 0741 10/18/15 0517 10/19/15 0528  WBC 9.7 9.9 11.6* 12.2* 10.2 10.8*  NEUTROABS 7.6  --  9.3*  --  7.7  --   HGB 10.7* 10.9* 11.8* 10.6* 11.1* 11.0*  HCT 31.2* 31.1* 33.0* 29.9* 31.5* 32.5*  MCV 91.2 91.2 89.2 90.9 91.3 92.9  PLT 256 266 296 297 347 413*    CBG:  Recent Labs Lab 10/18/15 0801 10/18/15 1133 10/18/15 1617 10/18/15 2014 10/19/15 0719  GLUCAP 118* 119* 118* 140* 106*       Signed:  Kathie Dike, MD  [facility] 10/19/2015, 10:33 AM   By signing my name below, I, Rennis Harding, attest that this documentation has been prepared under the direction and in the presence of Kathie Dike, MD. Electronically signed: Rennis Harding, Scribe. 10/19/2015 10:10am   I, Dr. Kathie Dike, personally performed the services described in this documentaiton. All medical record entries made by the scribe were at my direction and in my presence. I have reviewed the chart and agree that the record reflects my personal performance and is accurate and complete  Kathie Dike, MD, 10/19/2015 10:33 AM

## 2015-10-23 ENCOUNTER — Telehealth: Payer: Self-pay | Admitting: *Deleted

## 2015-10-23 DIAGNOSIS — Z7901 Long term (current) use of anticoagulants: Secondary | ICD-10-CM | POA: Diagnosis not present

## 2015-10-23 DIAGNOSIS — E119 Type 2 diabetes mellitus without complications: Secondary | ICD-10-CM | POA: Diagnosis not present

## 2015-10-23 DIAGNOSIS — C61 Malignant neoplasm of prostate: Secondary | ICD-10-CM | POA: Diagnosis not present

## 2015-10-23 DIAGNOSIS — Z5181 Encounter for therapeutic drug level monitoring: Secondary | ICD-10-CM | POA: Diagnosis not present

## 2015-10-23 DIAGNOSIS — K8591 Acute pancreatitis with uninfected necrosis, unspecified: Secondary | ICD-10-CM | POA: Diagnosis not present

## 2015-10-23 DIAGNOSIS — Z7984 Long term (current) use of oral hypoglycemic drugs: Secondary | ICD-10-CM | POA: Diagnosis not present

## 2015-10-23 DIAGNOSIS — M109 Gout, unspecified: Secondary | ICD-10-CM | POA: Diagnosis not present

## 2015-10-23 DIAGNOSIS — I10 Essential (primary) hypertension: Secondary | ICD-10-CM | POA: Diagnosis not present

## 2015-10-23 NOTE — Telephone Encounter (Signed)
INR 2.4 Taking coumadin 5 mg daily. Per Kayren Eaves verbal order, called [facility] back and gave order to continue same dose and recheck in one week

## 2015-10-25 ENCOUNTER — Ambulatory Visit (INDEPENDENT_AMBULATORY_CARE_PROVIDER_SITE_OTHER): Payer: Medicare Other | Admitting: Family Medicine

## 2015-10-25 ENCOUNTER — Encounter: Payer: Self-pay | Admitting: Family Medicine

## 2015-10-25 VITALS — BP 140/57 | HR 90 | Temp 98.1°F | Ht 65.0 in | Wt 161.2 lb

## 2015-10-25 DIAGNOSIS — K8591 Acute pancreatitis with uninfected necrosis, unspecified: Secondary | ICD-10-CM | POA: Diagnosis not present

## 2015-10-25 DIAGNOSIS — R011 Cardiac murmur, unspecified: Secondary | ICD-10-CM

## 2015-10-25 DIAGNOSIS — Z7984 Long term (current) use of oral hypoglycemic drugs: Secondary | ICD-10-CM | POA: Diagnosis not present

## 2015-10-25 DIAGNOSIS — I748 Embolism and thrombosis of other arteries: Secondary | ICD-10-CM | POA: Insufficient documentation

## 2015-10-25 DIAGNOSIS — C61 Malignant neoplasm of prostate: Secondary | ICD-10-CM | POA: Diagnosis not present

## 2015-10-25 DIAGNOSIS — E119 Type 2 diabetes mellitus without complications: Secondary | ICD-10-CM | POA: Diagnosis not present

## 2015-10-25 DIAGNOSIS — M109 Gout, unspecified: Secondary | ICD-10-CM | POA: Diagnosis not present

## 2015-10-25 DIAGNOSIS — I358 Other nonrheumatic aortic valve disorders: Secondary | ICD-10-CM

## 2015-10-25 DIAGNOSIS — I10 Essential (primary) hypertension: Secondary | ICD-10-CM | POA: Diagnosis not present

## 2015-10-25 DIAGNOSIS — Z5181 Encounter for therapeutic drug level monitoring: Secondary | ICD-10-CM | POA: Diagnosis not present

## 2015-10-25 DIAGNOSIS — Z7901 Long term (current) use of anticoagulants: Secondary | ICD-10-CM | POA: Diagnosis not present

## 2015-10-25 LAB — POCT INR: INR: 2

## 2015-10-25 MED ORDER — WARFARIN SODIUM 1 MG PO TABS: 1.0000 mg | ORAL_TABLET | Freq: Every day | ORAL | Status: DC

## 2015-10-25 NOTE — Progress Notes (Signed)
BP 140/57 mmHg  Pulse 90  Temp(Src) 98.1 F (36.7 C) (Oral)  Ht 5\' 5"  (1.651 m)  Wt 161 lb 3.2 oz (73.12 kg)  BMI 26.83 kg/m2   Subjective:    Patient ID: Alan [name]., male    DOB: [birth year], 74 y.o.   MRN: [id number]  HPI: Alan [name]. is a 74 y.o. male presenting on 10/25/2015 for Hospital followup   HPI Hospital follow-up Patient was seen in the hospital and admitted initially on 10/07/2015 and discharged on 10/19/2015. He was admitted for gallstone pancreatitis abdominal pain and cholecystitis. 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 He denies any swelling from clots in his legs or from anywhere else.  Relevant past medical, surgical, family and social history reviewed and updated as indicated. Interim medical history since our last visit reviewed. Allergies and medications reviewed and updated.  Review of Systems  Constitutional: Negative for fever and chills.  HENT: Negative for ear discharge and ear pain.   Eyes: Negative for discharge and visual disturbance.  Respiratory: Negative for shortness of breath and wheezing.   Cardiovascular: Negative for chest pain and leg swelling.  Gastrointestinal: Positive for nausea and abdominal pain (Much improved but still present). Negative for vomiting, diarrhea, constipation and blood in stool.  Genitourinary: Negative for difficulty urinating.  Musculoskeletal: Positive for back pain. Negative for gait problem.  Skin: Negative for rash.  Neurological: Negative for dizziness, syncope, light-headedness and headaches.  All other systems reviewed and are negative.   Per HPI unless specifically indicated above     Medication List       This list is accurate as of: 10/25/15  2:45 PM.  Always use your most recent med list.               amLODipine 10 MG tablet  Commonly known as:  NORVASC  Take 1 tablet (10 mg  total) by mouth every evening.     benazepril 40 MG tablet  Commonly known as:  LOTENSIN  TAKE ONE TABLET BY MOUTH ONCE DAILY     febuxostat 40 MG tablet  Commonly known as:  ULORIC  Take 2 tablets (80 mg total) by mouth daily.     finasteride 5 MG tablet  Commonly known as:  PROSCAR  Take 1 tablet (5 mg total) by mouth daily.     furosemide 40 MG tablet  Commonly known as:  LASIX  Take 1 tablet (40 mg total) by mouth daily as needed for fluid.     hydrALAZINE 50 MG tablet  Commonly known as:  APRESOLINE  Take 1 tablet (50 mg total) by mouth 3 (three) times daily.     metFORMIN 500 MG tablet  Commonly known as:  GLUCOPHAGE  Take 1 tablet (500 mg total) by mouth 2 (two) times daily with a meal.     metoprolol succinate 100 MG 24 hr tablet  Commonly known as:  TOPROL-XL  Take 1 tablet (100 mg total) by mouth 1 day or 1 dose.     MULTIVITAMIN PO  Take 1 tablet by mouth daily.     niacin 1000 MG CR tablet  Commonly known as:  NIASPAN  Take 1 tablet (1,000 mg total) by mouth at bedtime.     pravastatin 40 MG tablet  Commonly known as:  PRAVACHOL  Take 40 mg by mouth daily.     warfarin 5  MG tablet  Commonly known as:  COUMADIN  Take 1 tablet (5 mg total) by mouth daily at 6 PM.     warfarin 1 MG tablet  Commonly known as:  COUMADIN  Take 1 tablet (1 mg total) by mouth daily.           Objective:    BP 140/57 mmHg  Pulse 90  Temp(Src) 98.1 F (36.7 C) (Oral)  Ht 5\' 5"  (1.651 m)  Wt 161 lb 3.2 oz (73.12 kg)  BMI 26.83 kg/m2  Wt Readings from Last 3 Encounters:  10/25/15 161 lb 3.2 oz (73.12 kg)  10/19/15 158 lb 1.6 oz (71.714 kg)  09/08/15 174 lb (78.926 kg)    Physical Exam  Constitutional: He is oriented to person, place, and time. He appears well-developed and well-nourished. No distress.  Eyes: Conjunctivae and EOM are normal. Pupils are equal, round, and reactive to light. Right eye exhibits no discharge. No scleral icterus.  Cardiovascular: Normal  rate, regular rhythm, normal heart sounds and intact distal pulses.   No murmur heard. Pulmonary/Chest: Effort normal and breath sounds normal. No respiratory distress. He has no wheezes.  Abdominal: Soft. Bowel sounds are normal. He exhibits no distension, no ascites and no mass. There is tenderness in the right upper quadrant and epigastric area. There is no rigidity, no rebound, no guarding, no CVA tenderness, no tenderness at McBurney's point and negative Murphy's sign.  Musculoskeletal: Normal range of motion. He exhibits no edema.  Neurological: He is alert and oriented to person, place, and time. Coordination normal.  Skin: Skin is warm and dry. No rash noted. He is not diaphoretic. No erythema.  Psychiatric: He has a normal mood and affect. His behavior is normal.  Vitals reviewed.   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 We will increase from 5 mg to 6 mg and have him see Tammy for Coumadin management      Relevant Medications   pravastatin (PRAVACHOL) 40 MG tablet   warfarin (COUMADIN) 1 MG tablet   Other Relevant Orders   POCT INR (Completed)    Other Visit Diagnoses    Systolic murmur of aorta        Relevant Orders    Echocardiogram        Follow up plan: Return in about 1 year (around 10/24/2016), or if symptoms worsen or fail to improve, for see tammy for coumadin management.  Counseling provided for all of the vaccine components Orders Placed This Encounter  Procedures  . POCT INR  . Echocardiogram    Caryl Pina, MD [facility] 10/25/2015, 2:46 PM

## 2015-10-26 ENCOUNTER — Ambulatory Visit: Payer: Self-pay | Admitting: Family Medicine

## 2015-10-26 ENCOUNTER — Telehealth: Payer: Self-pay | Admitting: Gastroenterology

## 2015-10-26 DIAGNOSIS — K8511 Biliary acute pancreatitis with uninfected necrosis: Secondary | ICD-10-CM | POA: Diagnosis not present

## 2015-10-26 NOTE — Telephone Encounter (Signed)
Late entry:   Spoke with Dr. Arnoldo Morale earlier today. He has seen patient in his office. Dr. Arnoldo Morale would like for patient to be seen by [place] prior to proceeding with a cholecystectomy. If clinically doing well, we can go ahead and send our note over and let Dr. Arnoldo Morale schedule him. Would not do repeat imaging unless clinically indicated.   Please have patient come see [place] in next 2 weeks or so.

## 2015-10-27 ENCOUNTER — Encounter: Payer: Self-pay | Admitting: Internal Medicine

## 2015-10-27 DIAGNOSIS — Z7984 Long term (current) use of oral hypoglycemic drugs: Secondary | ICD-10-CM | POA: Diagnosis not present

## 2015-10-27 DIAGNOSIS — M109 Gout, unspecified: Secondary | ICD-10-CM | POA: Diagnosis not present

## 2015-10-27 DIAGNOSIS — Z5181 Encounter for therapeutic drug level monitoring: Secondary | ICD-10-CM | POA: Diagnosis not present

## 2015-10-27 DIAGNOSIS — I10 Essential (primary) hypertension: Secondary | ICD-10-CM | POA: Diagnosis not present

## 2015-10-27 DIAGNOSIS — Z7901 Long term (current) use of anticoagulants: Secondary | ICD-10-CM | POA: Diagnosis not present

## 2015-10-27 DIAGNOSIS — E119 Type 2 diabetes mellitus without complications: Secondary | ICD-10-CM | POA: Diagnosis not present

## 2015-10-27 DIAGNOSIS — K8591 Acute pancreatitis with uninfected necrosis, unspecified: Secondary | ICD-10-CM | POA: Diagnosis not present

## 2015-10-27 DIAGNOSIS — C61 Malignant neoplasm of prostate: Secondary | ICD-10-CM | POA: Diagnosis not present

## 2015-10-27 NOTE — Telephone Encounter (Signed)
APPOINTMENT MADE AND LETTER SENT °

## 2015-10-28 NOTE — Assessment & Plan Note (Signed)
INR 2.0 today, down from 2.5, 8 days ago. We will increase from 5 mg to 6 mg and have him see Tammy for Coumadin management

## 2015-10-29 ENCOUNTER — Telehealth: Payer: Self-pay | Admitting: *Deleted

## 2015-10-29 ENCOUNTER — Ambulatory Visit (INDEPENDENT_AMBULATORY_CARE_PROVIDER_SITE_OTHER): Payer: Medicare Other | Admitting: Pharmacist

## 2015-10-29 ENCOUNTER — Telehealth: Payer: Self-pay | Admitting: Family Medicine

## 2015-10-29 DIAGNOSIS — Z7901 Long term (current) use of anticoagulants: Secondary | ICD-10-CM | POA: Diagnosis not present

## 2015-10-29 DIAGNOSIS — E119 Type 2 diabetes mellitus without complications: Secondary | ICD-10-CM | POA: Diagnosis not present

## 2015-10-29 DIAGNOSIS — Z5181 Encounter for therapeutic drug level monitoring: Secondary | ICD-10-CM | POA: Diagnosis not present

## 2015-10-29 DIAGNOSIS — M109 Gout, unspecified: Secondary | ICD-10-CM | POA: Diagnosis not present

## 2015-10-29 DIAGNOSIS — I748 Embolism and thrombosis of other arteries: Secondary | ICD-10-CM

## 2015-10-29 DIAGNOSIS — C61 Malignant neoplasm of prostate: Secondary | ICD-10-CM | POA: Diagnosis not present

## 2015-10-29 DIAGNOSIS — I10 Essential (primary) hypertension: Secondary | ICD-10-CM | POA: Diagnosis not present

## 2015-10-29 DIAGNOSIS — K8591 Acute pancreatitis with uninfected necrosis, unspecified: Secondary | ICD-10-CM | POA: Diagnosis not present

## 2015-10-29 DIAGNOSIS — Z7984 Long term (current) use of oral hypoglycemic drugs: Secondary | ICD-10-CM | POA: Diagnosis not present

## 2015-10-29 LAB — POCT INR: INR: 2.5

## 2015-10-29 MED ORDER — ONETOUCH ULTRASOFT LANCETS MISC: Status: DC

## 2015-10-29 MED ORDER — GLUCOSE BLOOD VI STRP: ORAL_STRIP | Status: DC

## 2015-10-29 NOTE — Progress Notes (Signed)
INR checked by home health nurse in patient's home

## 2015-10-29 NOTE — Telephone Encounter (Signed)
INR has increased from 2.0 to 2.5 in just 4 days.  Recently warfarin was increased from 5mg  daily to 6mg  daily.  I recommended that he change warfarin dose to 5mg  MWF and 6mg  all other days.   Recheck INR 11/03/2015.  Alan [name] was notified of above and she will recheck next week.  Patient's wife notified of results and new dose.  She repeated and voiced understanding of changes.

## 2015-10-29 NOTE — Telephone Encounter (Signed)
INR 2.5. He is on 6mg  qd

## 2015-10-29 NOTE — Telephone Encounter (Signed)
done

## 2015-11-01 ENCOUNTER — Ambulatory Visit (INDEPENDENT_AMBULATORY_CARE_PROVIDER_SITE_OTHER): Payer: Medicare Other | Admitting: Pharmacist

## 2015-11-01 DIAGNOSIS — I748 Embolism and thrombosis of other arteries: Secondary | ICD-10-CM | POA: Diagnosis not present

## 2015-11-01 LAB — POCT INR: INR: 1.7

## 2015-11-01 NOTE — Patient Instructions (Signed)
Anticoagulation Dose Instructions as of 11/01/2015      Dorene Grebe Tue Wed Thu Fri Sat   New Dose 6 mg 5 mg 6 mg 5 mg 6 mg 5 mg 6 mg    Description        Take 7 mg today (1 tablet of 5mg  and 2 tablets of 1mg ). Then restart dose of 5mg  on mondays, wednesdays and fridays and 6mg  all other days.      INR was 1.7 today (goal 2.0 to 3.0)

## 2015-11-03 ENCOUNTER — Telehealth: Payer: Self-pay

## 2015-11-03 ENCOUNTER — Ambulatory Visit (INDEPENDENT_AMBULATORY_CARE_PROVIDER_SITE_OTHER): Payer: Medicare Other | Admitting: Pharmacist

## 2015-11-03 DIAGNOSIS — E119 Type 2 diabetes mellitus without complications: Secondary | ICD-10-CM | POA: Diagnosis not present

## 2015-11-03 DIAGNOSIS — Z7901 Long term (current) use of anticoagulants: Secondary | ICD-10-CM | POA: Diagnosis not present

## 2015-11-03 DIAGNOSIS — M109 Gout, unspecified: Secondary | ICD-10-CM | POA: Diagnosis not present

## 2015-11-03 DIAGNOSIS — K8591 Acute pancreatitis with uninfected necrosis, unspecified: Secondary | ICD-10-CM | POA: Diagnosis not present

## 2015-11-03 DIAGNOSIS — C61 Malignant neoplasm of prostate: Secondary | ICD-10-CM | POA: Diagnosis not present

## 2015-11-03 DIAGNOSIS — I748 Embolism and thrombosis of other arteries: Secondary | ICD-10-CM

## 2015-11-03 DIAGNOSIS — I10 Essential (primary) hypertension: Secondary | ICD-10-CM | POA: Diagnosis not present

## 2015-11-03 DIAGNOSIS — Z5181 Encounter for therapeutic drug level monitoring: Secondary | ICD-10-CM | POA: Diagnosis not present

## 2015-11-03 DIAGNOSIS — Z7984 Long term (current) use of oral hypoglycemic drugs: Secondary | ICD-10-CM | POA: Diagnosis not present

## 2015-11-03 LAB — POCT INR: INR: 1.8

## 2015-11-03 NOTE — Telephone Encounter (Signed)
INR  1.8  PT 21.4  Taking 5 mg Mon. Wed and Friday   6 mg all other days

## 2015-11-03 NOTE — Telephone Encounter (Signed)
INR has increase from Monday when INR was 1.7.  Patient has missed a does Sunday, November 20th.  Patient to take 7mg  of warfarin today and then continue current dose of 5mg  MWF and 6mg  all other days.  Recheck 11/30/02016 (date of next home visit with [facility]) Alan [name] with [facility] notified. Patient's wife notified.

## 2015-11-05 ENCOUNTER — Encounter: Payer: Self-pay | Admitting: Family Medicine

## 2015-11-05 ENCOUNTER — Ambulatory Visit (INDEPENDENT_AMBULATORY_CARE_PROVIDER_SITE_OTHER): Payer: Medicare Other | Admitting: Family Medicine

## 2015-11-05 VITALS — BP 127/64 | HR 81 | Temp 97.8°F | Ht 65.0 in | Wt 162.2 lb

## 2015-11-05 DIAGNOSIS — J019 Acute sinusitis, unspecified: Secondary | ICD-10-CM | POA: Diagnosis not present

## 2015-11-05 MED ORDER — LEVOCETIRIZINE DIHYDROCHLORIDE 5 MG PO TABS: 5.0000 mg | ORAL_TABLET | Freq: Every evening | ORAL | Status: DC

## 2015-11-05 MED ORDER — FLUTICASONE PROPIONATE 50 MCG/ACT NA SUSP: 1.0000 | Freq: Two times a day (BID) | NASAL | Status: DC | PRN

## 2015-11-05 NOTE — Progress Notes (Signed)
BP 127/64 mmHg  Pulse 81  Temp(Src) 97.8 F (36.6 C) (Oral)  Ht 5\' 5"  (1.651 m)  Wt 162 lb 3.2 oz (73.573 kg)  BMI 26.99 kg/m2   Subjective:    Patient ID: Alan Netters., male    DOB: Jul 21, 1941, 74 y.o.   MRN: [id number]  HPI: Alan Lamartina. is a 74 y.o. male presenting on 11/05/2015 for Cough and Sinusitis   HPI Sinus congestion and pain Patient presents today because he's been having sinus congestion and headache for the past 4 days. He denies fevers or chills. He has been having postnasal drainage and productive cough with white to yellow sputum. He denies any shortness of breath or wheezing. His wife has been ill with the same sickness. He has tried over-the-counter Alka-Seltzer without much success.  Relevant past medical, surgical, family and social history reviewed and updated as indicated. Interim medical history since our last visit reviewed. Allergies and medications reviewed and updated.  Review of Systems  Constitutional: Negative for fever and chills.  HENT: Positive for congestion, postnasal drip, rhinorrhea, sinus pressure and sore throat. Negative for ear discharge, ear pain, sneezing and voice change.   Eyes: Negative for pain, discharge, redness and visual disturbance.  Respiratory: Positive for cough. Negative for shortness of breath and wheezing.   Cardiovascular: Negative for chest pain and leg swelling.  Gastrointestinal: Negative for abdominal pain, diarrhea and constipation.  Genitourinary: Negative for difficulty urinating.  Musculoskeletal: Negative for back pain and gait problem.  Skin: Negative for rash.  Neurological: Negative for syncope, light-headedness and headaches.  All other systems reviewed and are negative.   Per HPI unless specifically indicated above     Medication List       This list is accurate as of: 11/05/15 12:31 PM.  Always use your most recent med list.               amLODipine 10 MG tablet  Commonly known as:   NORVASC  Take 1 tablet (10 mg total) by mouth every evening.     benazepril 40 MG tablet  Commonly known as:  LOTENSIN  TAKE ONE TABLET BY MOUTH ONCE DAILY     febuxostat 40 MG tablet  Commonly known as:  ULORIC  Take 2 tablets (80 mg total) by mouth daily.     finasteride 5 MG tablet  Commonly known as:  PROSCAR  Take 1 tablet (5 mg total) by mouth daily.     fluticasone 50 MCG/ACT nasal spray  Commonly known as:  FLONASE  Place 1 spray into both nostrils 2 (two) times daily as needed for allergies or rhinitis.     furosemide 40 MG tablet  Commonly known as:  LASIX  Take 1 tablet (40 mg total) by mouth daily as needed for fluid.     glucose blood test strip  Commonly known as:  ONE TOUCH TEST STRIPS  Test QD. DX E11.9     hydrALAZINE 50 MG tablet  Commonly known as:  APRESOLINE  Take 1 tablet (50 mg total) by mouth 3 (three) times daily.     levocetirizine 5 MG tablet  Commonly known as:  XYZAL  Take 1 tablet (5 mg total) by mouth every evening.     metFORMIN 500 MG tablet  Commonly known as:  GLUCOPHAGE  Take 1 tablet (500 mg total) by mouth 2 (two) times daily with a meal.     metoprolol succinate 100 MG 24 hr tablet  Commonly known  as:  TOPROL-XL  Take 1 tablet (100 mg total) by mouth 1 day or 1 dose.     niacin 1000 MG CR tablet  Commonly known as:  NIASPAN  Take 1 tablet (1,000 mg total) by mouth at bedtime.     onetouch ultrasoft lancets  Test blood sugar qd. DX E11.9     pravastatin 40 MG tablet  Commonly known as:  PRAVACHOL  Take 40 mg by mouth daily.     warfarin 5 MG tablet  Commonly known as:  COUMADIN  Take 1 tablet (5 mg total) by mouth daily at 6 PM.     warfarin 1 MG tablet  Commonly known as:  COUMADIN  Take 1 tablet (1 mg total) by mouth daily.           Objective:    BP 127/64 mmHg  Pulse 81  Temp(Src) 97.8 F (36.6 C) (Oral)  Ht 5\' 5"  (1.651 m)  Wt 162 lb 3.2 oz (73.573 kg)  BMI 26.99 kg/m2  Wt Readings from Last 3  Encounters:  11/05/15 162 lb 3.2 oz (73.573 kg)  10/25/15 161 lb 3.2 oz (73.12 kg)  10/19/15 158 lb 1.6 oz (71.714 kg)    Physical Exam  Constitutional: He is oriented to person, place, and time. He appears well-developed and well-nourished. No distress.  HENT:  Right Ear: Tympanic membrane, external ear and ear canal normal.  Left Ear: Tympanic membrane, external ear and ear canal normal.  Nose: Mucosal edema and rhinorrhea present. No sinus tenderness. No epistaxis. Right sinus exhibits maxillary sinus tenderness and frontal sinus tenderness. Left sinus exhibits maxillary sinus tenderness and frontal sinus tenderness.  Mouth/Throat: Uvula is midline and mucous membranes are normal. Posterior oropharyngeal edema and posterior oropharyngeal erythema present. No oropharyngeal exudate or tonsillar abscesses.  Eyes: Conjunctivae and EOM are normal. Pupils are equal, round, and reactive to light. Right eye exhibits no discharge. No scleral icterus.  Cardiovascular: Normal rate, regular rhythm, normal heart sounds and intact distal pulses.   No murmur heard. Pulmonary/Chest: Effort normal and breath sounds normal. No respiratory distress. He has no wheezes.  Abdominal: He exhibits no distension.  Musculoskeletal: Normal range of motion. He exhibits no edema.  Neurological: He is alert and oriented to person, place, and time. Coordination normal.  Skin: Skin is warm and dry. No rash noted. He is not diaphoretic.  Psychiatric: He has a normal mood and affect. His behavior is normal.  Vitals reviewed.   Results for orders placed or performed in visit on 11/03/15  POCT INR  Result Value Ref Range   INR 1.8       Assessment & Plan:   Problem List Items Addressed This Visit    None    Visit Diagnoses    Acute rhinosinusitis    -  Primary    Use Flonase and antihistamine. If she develops high fevers or worsens call or return. Avoiding antibiotic because of Coumadin.    Relevant Medications     fluticasone (FLONASE) 50 MCG/ACT nasal spray    levocetirizine (XYZAL) 5 MG tablet        Follow up plan: Return if symptoms worsen or fail to improve.  Counseling provided for all of the vaccine components No orders of the defined types were placed in this encounter.    Caryl Pina, MD [facility] 11/05/2015, 12:31 PM

## 2015-11-10 ENCOUNTER — Ambulatory Visit (INDEPENDENT_AMBULATORY_CARE_PROVIDER_SITE_OTHER): Payer: Medicare Other | Admitting: Pharmacist

## 2015-11-10 ENCOUNTER — Telehealth: Payer: Self-pay | Admitting: *Deleted

## 2015-11-10 DIAGNOSIS — I748 Embolism and thrombosis of other arteries: Secondary | ICD-10-CM

## 2015-11-10 DIAGNOSIS — K8591 Acute pancreatitis with uninfected necrosis, unspecified: Secondary | ICD-10-CM | POA: Diagnosis not present

## 2015-11-10 DIAGNOSIS — I10 Essential (primary) hypertension: Secondary | ICD-10-CM | POA: Diagnosis not present

## 2015-11-10 DIAGNOSIS — E119 Type 2 diabetes mellitus without complications: Secondary | ICD-10-CM | POA: Diagnosis not present

## 2015-11-10 DIAGNOSIS — Z5181 Encounter for therapeutic drug level monitoring: Secondary | ICD-10-CM | POA: Diagnosis not present

## 2015-11-10 DIAGNOSIS — Z7984 Long term (current) use of oral hypoglycemic drugs: Secondary | ICD-10-CM | POA: Diagnosis not present

## 2015-11-10 DIAGNOSIS — M109 Gout, unspecified: Secondary | ICD-10-CM | POA: Diagnosis not present

## 2015-11-10 DIAGNOSIS — C61 Malignant neoplasm of prostate: Secondary | ICD-10-CM | POA: Diagnosis not present

## 2015-11-10 DIAGNOSIS — Z7901 Long term (current) use of anticoagulants: Secondary | ICD-10-CM | POA: Diagnosis not present

## 2015-11-10 LAB — POCT INR: INR: 2.3

## 2015-11-10 NOTE — Progress Notes (Signed)
INR checked by home health nurse in patient's home  No charge for visit or labs.

## 2015-11-10 NOTE — Telephone Encounter (Signed)
Therapeutic anticoagulation.  Continue current dose of warfarin - uses 5mg  and 1mg  tablets - 5mg  MWF and 6mg  all other days. Patient denies and s/s of bleeding .  Patient and Alan [name] with [facility] notified.  appt made to follow up  In clinic next week fro protime since this was [facility] last visit.

## 2015-11-10 NOTE — Telephone Encounter (Signed)
Protime 27.6 INR 2.3  This is [name] last visit, please call her.

## 2015-11-12 ENCOUNTER — Ambulatory Visit (INDEPENDENT_AMBULATORY_CARE_PROVIDER_SITE_OTHER): Payer: Medicare Other | Admitting: Gastroenterology

## 2015-11-12 ENCOUNTER — Encounter: Payer: Self-pay | Admitting: Gastroenterology

## 2015-11-12 ENCOUNTER — Other Ambulatory Visit: Payer: Self-pay

## 2015-11-12 VITALS — BP 154/68 | HR 68 | Temp 97.3°F | Ht 65.0 in | Wt 156.8 lb

## 2015-11-12 DIAGNOSIS — K8581 Other acute pancreatitis with uninfected necrosis: Secondary | ICD-10-CM

## 2015-11-12 DIAGNOSIS — K858 Other acute pancreatitis without necrosis or infection: Secondary | ICD-10-CM

## 2015-11-12 DIAGNOSIS — K859 Acute pancreatitis without necrosis or infection, unspecified: Secondary | ICD-10-CM | POA: Diagnosis not present

## 2015-11-12 DIAGNOSIS — Z719 Counseling, unspecified: Secondary | ICD-10-CM

## 2015-11-12 LAB — HEPATIC FUNCTION PANEL
ALK PHOS: 71 U/L (ref 40–115)
ALT: 15 U/L (ref 9–46)
AST: 18 U/L (ref 10–35)
Albumin: 3.6 g/dL (ref 3.6–5.1)
BILIRUBIN DIRECT: 0.2 mg/dL (ref <=0.2)
BILIRUBIN INDIRECT: 0.5 mg/dL (ref 0.2–1.2)
BILIRUBIN TOTAL: 0.7 mg/dL (ref 0.2–1.2)
TOTAL PROTEIN: 6.9 g/dL (ref 6.1–8.1)

## 2015-11-12 NOTE — Patient Instructions (Signed)
You are ok from our standpoint to proceed with getting your gallbladder out.  I would like to recheck your liver numbers today to ensure they are going down.  Your next colonoscopy is in 2017. You can choose to stay with [place] and have done here, or you can go to [place].

## 2015-11-12 NOTE — Progress Notes (Signed)
CC'ED TO PCP 

## 2015-11-12 NOTE — Assessment & Plan Note (Signed)
74 year old male with history of acute complicated pancreatitis, with prolonged hospital course and splenic vein thrombosis, currently on Coumadin. Thought to be biliary pancreatitis. Doing well at time of visit without any concerning signs/symptoms. No need for repeat imaging as clinically he has vastly improved. Will recheck HFP now to follow to normal. Next colonoscopy in 2017 either with [place] or Dr. Fuller Plan: patient will decide. Refer back to Dr. Arnoldo Morale for evaluation.

## 2015-11-12 NOTE — Progress Notes (Signed)
Referring Provider: Wardell Honour, MD Primary Care Physician:  Wardell Honour, MD  Primary GI: Dr. Gala Romney   Chief Complaint  Patient presents with  . Follow-up    HPI:   Alan [name]. is a 74 y.o. male presenting today in hospital follow-up from acute biliary pancreatitis complicated by necrosis and splenic vein thrombosis, on anticoagulation. Presenting here today for a visit prior to being seen again by Dr. Arnoldo Morale for consideration of laparoscopic cholecystectomy.   No pain with eating. No fever or chills. On Coumadin. No N/V. No overt GI bleeding.     Past Medical History  Diagnosis Date  . Hypertension   . Heart murmur   . ED (erectile dysfunction)   . Agranulocytosis ([place])   . Diabetes mellitus   . Hyperlipidemia   . Gout   . Adenomatous colon polyp   . Prostate cancer ([place])     Followed by Dr. Jeffie Pollock   . Pancreatitis   . Gallstones     Past Surgical History  Procedure Laterality Date  . Colonoscopy  2012    [name]: moderate sigmoid diverticulosis, internal hemorrhoids. next tcs 2017  . Sigmoidoscopy      Current Outpatient Prescriptions  Medication Sig Dispense Refill  . amLODipine (NORVASC) 10 MG tablet Take 1 tablet (10 mg total) by mouth every evening. 90 tablet 2  . benazepril (LOTENSIN) 40 MG tablet TAKE ONE TABLET BY MOUTH ONCE DAILY 90 tablet 2  . febuxostat (ULORIC) 40 MG tablet Take 2 tablets (80 mg total) by mouth daily. 60 tablet 3  . fluticasone (FLONASE) 50 MCG/ACT nasal spray Place 1 spray into both nostrils 2 (two) times daily as needed for allergies or rhinitis. 16 g 6  . furosemide (LASIX) 40 MG tablet Take 1 tablet (40 mg total) by mouth daily as needed for fluid. 30 tablet 1  . glucose blood (ONE TOUCH TEST STRIPS) test strip Test QD. DX E11.9 100 each 5  . hydrALAZINE (APRESOLINE) 50 MG tablet Take 1 tablet (50 mg total) by mouth 3 (three) times daily. 90 tablet 1  . Lancets (ONETOUCH ULTRASOFT) lancets Test blood sugar qd.  DX E11.9 100 each 5  . levocetirizine (XYZAL) 5 MG tablet Take 1 tablet (5 mg total) by mouth every evening. 30 tablet 1  . metFORMIN (GLUCOPHAGE) 500 MG tablet Take 1 tablet (500 mg total) by mouth 2 (two) times daily with a meal. 180 tablet 2  . metoprolol succinate (TOPROL-XL) 100 MG 24 hr tablet Take 1 tablet (100 mg total) by mouth 1 day or 1 dose. 90 tablet 2  . niacin (NIASPAN) 1000 MG CR tablet Take 1 tablet (1,000 mg total) by mouth at bedtime. 30 tablet 3  . pravastatin (PRAVACHOL) 40 MG tablet Take 40 mg by mouth daily.    Marland Kitchen warfarin (COUMADIN) 1 MG tablet Take 1 tablet (1 mg total) by mouth daily. 30 tablet 1  . warfarin (COUMADIN) 5 MG tablet Take 1 tablet (5 mg total) by mouth daily at 6 PM. 30 tablet 1  . finasteride (PROSCAR) 5 MG tablet Take 1 tablet (5 mg total) by mouth daily. (Patient not taking: Reported on 11/12/2015) 90 tablet 0   No current facility-administered medications for this visit.    Allergies as of 11/12/2015 - Review Complete 11/12/2015  Allergen Reaction Noted  . Allopurinol Other (See Comments) 05/16/2011  . Colchicine Other (See Comments) 05/16/2011    Family History  Problem Relation Age of Onset  .  Stroke Mother   . Hypertension Mother   . Alzheimer's disease Mother   . Hypertension Father   . Cancer Sister     brain  . Cancer Sister     breast  . Colon cancer Neg Hx   . Pancreatic disease Neg Hx     Social History   Social History  . Marital Status: Married    Spouse Name: N/A  . Number of Children: N/A  . Years of Education: N/A   Social History Main Topics  . Smoking status: Former Smoker    Types: Cigarettes    Quit date: 12/11/1986  . Smokeless tobacco: Never Used  . Alcohol Use: No  . Drug Use: No  . Sexual Activity: Yes   Other Topics Concern  . None   Social History Narrative    Review of Systems: Gen: see HPI  CV: Denies chest pain, palpitations, syncope, peripheral edema, and claudication. Resp: +DOE GI: see  HPI  Derm: Denies rash, itching, dry skin Psych: Denies depression, anxiety, memory loss, confusion. No homicidal or suicidal ideation.  Heme: Denies bruising, bleeding, and enlarged lymph nodes.  Physical Exam: BP 154/68 mmHg  Pulse 68  Temp(Src) 97.3 F (36.3 C) (Oral)  Ht 5\' 5"  (1.651 m)  Wt 156 lb 12.8 oz (71.124 kg)  BMI 26.09 kg/m2 General:   Alert and oriented. No distress noted. Pleasant and cooperative.  Head:  Normocephalic and atraumatic. Eyes:  Conjuctiva clear without scleral icterus. Mouth:  Oral mucosa pink and moist. Good dentition. No lesions. Heart:  S1, S2 present with questionable soft systolic murmur Abdomen:  +BS, soft, non-tender and non-distended. Small umbilical hernia Msk:  Symmetrical without gross deformities. Normal posture. Extremities:  Without edema. Neurologic:  Alert and  oriented x4;  grossly normal neurologically. Psych:  Alert and cooperative. Normal mood and affect.  Lab Results  Component Value Date   ALT 68* 10/18/2015   AST 34 10/18/2015   ALKPHOS 145* 10/18/2015   BILITOT 1.3* 10/18/2015

## 2015-11-16 DIAGNOSIS — K8511 Biliary acute pancreatitis with uninfected necrosis: Secondary | ICD-10-CM | POA: Diagnosis not present

## 2015-11-17 NOTE — H&P (Signed)
  NTS SOAP Note  Vital Signs:  Vitals as of: 99991111: Systolic 0000000: Diastolic 63: Heart Rate 68: Temp 97.72F: Height 3ft 5in: Weight 161Lbs 0 Ounces: BMI 26.79  BMI : 26.79 kg/m2  Subjective: This 74 year old male presents for of gallstone pancreatitis.  Occurred two weeks ago.  Has a decreased appetite currently.  No abdominal pain noted.  Review of Symptoms:  Constitutional:fatigue Head:unremarkable Eyes:blurred vision bilateral Nose/Mouth/Throat:unremarkable Cardiovascular:  unremarkable Respiratory:dyspnea Gastrointestinheartburn, dyspepsia Genitourinary:unremarkable   Musculoskeletal:unremarkable Skin:unremarkable Hematolgic/Lymphatic:unremarkable   Allergic/Immunologic:unremarkable   Past Medical History:  Reviewed  Past Medical History  Surgical History: prostate surgery Medical Problems: HTN, splenic artery thrombosis, prostate cancer, gout, niddm Allergies: allopurinol, colchicine Medications: amlodipine, benazepril, finasteride, lasix, hydralazine, metformin, metoprolol, niacin, coumadin, pravastatin   Social History:Reviewed  Social History  Preferred Language: English Race:  Black or African American Ethnicity: Not Hispanic / Latino Age: 77 year Marital Status:  S Alcohol: no   Smoking Status: Never smoker reviewed on 10/26/2015 Functional Status reviewed on 10/26/2015 ------------------------------------------------ Bathing: Normal Cooking: Normal Dressing: Normal Driving: Normal Eating: Normal Managing Meds: Normal Oral Care: Normal Shopping: Normal Toileting: Normal Transferring: Normal Walking: Normal Cognitive Status reviewed on 10/26/2015 ------------------------------------------------ Attention: Normal Decision Making: Normal Language: Normal Memory: Normal Motor: Normal Perception: Normal Problem Solving: Normal Visual and Spatial: Normal   Family History:Reviewed  Family Health History Family  History is Unknown    Objective Information: General:Well appearing, well nourished in no distress. Heart:RRR, no murmur Lungs:  CTA bilaterally, no wheezes, rhonchi, rales.  Breathing unlabored. Abdomen:Soft, NT/ND, no HSM, no masses. CT scan showed cholelithiasis, necrotizing pancreatitis. Assessment:Gallstone pancreatitis  Diagnoses: 577.8  K85.11 Gallstone pancreatitis (Biliary acute pancreatitis with uninfected necrosis)  Procedures: [id number] - OFFICE OUTPATIENT NEW 30 MINUTES    Plan:  Scheduled for laparoscopic cholecystectomy on 11/26/2015. The risks and benefits of the procedure including bleeding, infection, hepatobiliary injury, and the possibility of an open procedure were fully explained to the patient, who gave informed consent. He will stop his Coumadin on 11/22/2015.   Patient Education:Alternative treatments to surgery were discussed with patient (and family).  Risks and benefits  of procedure were fully explained to the patient (and family) who gave informed consent. Patient/family questions were addressed.  Follow-up:Pending Test Results

## 2015-11-18 ENCOUNTER — Ambulatory Visit (INDEPENDENT_AMBULATORY_CARE_PROVIDER_SITE_OTHER): Payer: Medicare Other | Admitting: Pharmacist

## 2015-11-18 DIAGNOSIS — I748 Embolism and thrombosis of other arteries: Secondary | ICD-10-CM

## 2015-11-18 LAB — POCT INR: INR: 1.9

## 2015-11-18 MED ORDER — ENOXAPARIN SODIUM 80 MG/0.8ML ~~LOC~~ SOLN: SUBCUTANEOUS | Status: DC

## 2015-11-18 NOTE — Patient Instructions (Signed)
Anticoagulation Dose Instructions as of 11/18/2015      Alan [name] Tue Wed Thu Fri Sat   New Dose 6 mg 5 mg 6 mg 5 mg 6 mg 5 mg 6 mg    Description        Surgery planned for 11/26/2015.  Will start to hold warfarin 11/22/2015.  For now continue current dose of 5mg  on mondays, wednesdays and fridays and 6mg  all other days.      INR was 1.9 today

## 2015-11-18 NOTE — Progress Notes (Signed)
Quick Note:  Complete normalization of LFTs after biliary pancreatitis. ______

## 2015-11-18 NOTE — Progress Notes (Signed)
Patient was advised by surgeon to stop warfarin starting Monday 11/22/15.  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  Warfarin and enoxaparin to be restart following surgery as deemed necessary by surgeon / GI.  Sunday, November 21, 2015 Last dose of warfarin   Monday, November 22, 2015 No warfarin   Tuesday, November 23, 2015 No warfarin Lovenox/enoxaparin 70mg  in am  & 70mg  in pm   Wednesday, November 24, 2015 No warfarin Lovenox/enoxaparin 70mg  in am & 70mg  in pm  Thursday, November 25, 2015 No warfarin Lovenox/enoxaparin 70mg  in am only    Friday, December 16th Day of Surgery No warfarin No Lovenox/enoxaparin

## 2015-11-19 ENCOUNTER — Telehealth: Payer: Self-pay | Admitting: Pharmacist

## 2015-11-19 NOTE — Patient Instructions (Signed)
[name]  11/19/2015     @PREFPERIOPPHARMACY @   Your procedure is scheduled on 11/26/2015.  Report to [facility] at 7:00 A.M.  Call this number if you have problems the morning of surgery:  [phone]   Remember:  Do not eat food or drink liquids after midnight.  Take these medicines the morning of surgery with A SIP OF WATER Norvasc, Lotensin, Flonase, Apresoline and Metoprolol   Do not wear jewelry, make-up or nail polish.  Do not wear lotions, powders, or perfumes.  You may wear deodorant.  Do not shave 48 hours prior to surgery.  Men may shave face and neck.  Do not bring valuables to the hospital.  [facility] is not responsible for any belongings or valuables.  Contacts, dentures or bridgework may not be worn into surgery.  Leave your suitcase in the car.  After surgery it may be brought to your room.  For patients admitted to the hospital, discharge time will be determined by your treatment team.  Patients discharged the day of surgery will not be allowed to drive home.   Name and phone number of your driver:   family Special instructions:  n/a  Please read over the following fact sheets that you were given. Care and Recovery After Surgery   Cholecystostomy Cholecystostomy is a procedure to drain fluid from the gallbladder by using a flexible tube (catheter). The gallbladder is a pear-shaped organ that lies beneath the liver on the right side of the body. The gallbladder stores bile, which is a fluid that helps the body to digest fats. You may have this procedure:  If your gallbladder is swollen or irritated due to bile build up.  To prepare you for gallbladder surgery.  To control your symptoms if you cannot have gallbladder surgery. LET [facility] CARE PROVIDER KNOW ABOUT:   Any allergies you have.  All medicines you are taking, including vitamins, herbs, eye drops, creams, and over-the-counter medicines.  Previous problems you or members of your  family have had with the use of anesthetics.  Any blood disorders you have.  Previous surgeries you have had.  Any medical conditions you have.  Whether you are pregnant or may be pregnant. RISKS AND COMPLICATIONS Generally, this is a safe procedure. However, problems may occur, including:  The catheter moving out of place.  Clogging of the catheter.  Infection of the incision site.  Internal bleeding.  Puncture of the gallbladder. This can cause the bile to leak.  Infection inside the abdomen (peritonitis).  Damage to other structures or organs.  Low blood pressure and slowed heart rate.  Allergic reactions to medicines or dyes. BEFORE THE PROCEDURE  You may need to have tests, including:  Imaging studies of your gallbladder.  Blood tests.  Follow instructions from your health care provider about eating or drinking restrictions.  Do not use tobacco products, including cigarettes, chewing tobacco, or e-cigarettes, as told by your health care provider. If you need help quitting, ask your health care provider.  Ask your health care provider about:  Changing or stopping your regular medicines. This is especially important if you are taking diabetes medicines or blood thinners.  Taking medicines such as aspirin and ibuprofen. These medicines can thin your blood. Do not take these medicines before your procedure if your health care provider instructs you not to.  Plan to have someone take you home after the procedure.  If you go home right after the procedure, plan to  have someone with you for 24 hours.  Ask your health care provider how your surgical site will be marked or identified.  You may be given antibiotic medicine to help prevent infection. PROCEDURE  To reduce your risk of infection:  Your health care team will wash or sanitize their hands.  Your skin will be washed with soap.  An IV tube will be inserted into one of your veins.  You will be given  one or more of the following:  A medicine to help you relax (sedative).  A medicine to numb the area (local anesthetic).  A small incision will be made in your abdomen.  A long needle or a wide puncturing tool (trocar) will be put through the incision.  Your health care provider will use an imaging study (ultrasound) to guide the needle or trocar into your gallbladder.  After the needle or trocar is in your gallbladder, a small amount of dye may be injected. An X-ray may be taken to make sure that the needle or trocar is in the correct place.  A catheter will be placed through the needle or trocar.  The catheter will be secured to your skin with stitches (sutures).  The catheter will be connected to a drainage bag. Fluid will drain from the gallbladder into the bag. Some of this bile may be sent to the lab to be examined.  A bandage (dressing) will be placed over the incision. The procedure may vary among health care providers and hospitals. AFTER THE PROCEDURE  Your blood pressure, heart rate, breathing rate, and blood oxygen level will be monitored often until the medicines you were given have worn off.  Dye may be injected through your catheter to check the catheter and your gallbladder.  Your gallbladder may be flushed out (irrigated) through the catheter.  Your catheter and drainage bag may need to stay in place for several weeks or as told by your health care provider.   This information is not intended to replace advice given to you by your health care provider. Make sure you discuss any questions you have with your health care provider.   Document Released: 02/23/2009 Document Revised: 08/18/2015 Document Reviewed: 03/10/2015 Elsevier Interactive Patient Education [employer].

## 2015-11-19 NOTE — Telephone Encounter (Signed)
Patient to come in at 4pm to show how to use Lovenox

## 2015-11-22 ENCOUNTER — Other Ambulatory Visit: Payer: Self-pay

## 2015-11-22 ENCOUNTER — Encounter (HOSPITAL_COMMUNITY)
Admission: RE | Admit: 2015-11-22 | Discharge: 2015-11-22 | Disposition: A | Discharge status: Home / Self Care | Payer: Medicare Other | Source: Ambulatory Visit | Attending: General Surgery | Admitting: General Surgery

## 2015-11-22 ENCOUNTER — Telehealth: Payer: Self-pay | Admitting: Family Medicine

## 2015-11-22 ENCOUNTER — Encounter (HOSPITAL_COMMUNITY): Payer: Self-pay

## 2015-11-22 DIAGNOSIS — Z01818 Encounter for other preprocedural examination: Secondary | ICD-10-CM | POA: Insufficient documentation

## 2015-11-22 DIAGNOSIS — K8020 Calculus of gallbladder without cholecystitis without obstruction: Secondary | ICD-10-CM | POA: Diagnosis not present

## 2015-11-22 LAB — CBC WITH DIFFERENTIAL/PLATELET
BASOS ABS: 0 10*3/uL (ref 0.0–0.1)
BASOS PCT: 0 %
EOS ABS: 0.1 10*3/uL (ref 0.0–0.7)
Eosinophils Relative: 2 %
HCT: 34.8 % — ABNORMAL LOW (ref 39.0–52.0)
Hemoglobin: 11.6 g/dL — ABNORMAL LOW (ref 13.0–17.0)
Lymphocytes Relative: 37 %
Lymphs Abs: 2.6 10*3/uL (ref 0.7–4.0)
MCH: 30.9 pg (ref 26.0–34.0)
MCHC: 33.3 g/dL (ref 30.0–36.0)
MCV: 92.6 fL (ref 78.0–100.0)
MONO ABS: 0.7 10*3/uL (ref 0.1–1.0)
MONOS PCT: 10 %
NEUTROS ABS: 3.6 10*3/uL (ref 1.7–7.7)
Neutrophils Relative %: 51 %
Platelets: 306 10*3/uL (ref 150–400)
RBC: 3.76 MIL/uL — ABNORMAL LOW (ref 4.22–5.81)
RDW: 14.7 % (ref 11.5–15.5)
WBC: 7.1 10*3/uL (ref 4.0–10.5)

## 2015-11-22 LAB — BASIC METABOLIC PANEL
ANION GAP: 11 (ref 5–15)
BUN: 11 mg/dL (ref 6–20)
CALCIUM: 9.6 mg/dL (ref 8.9–10.3)
CO2: 25 mmol/L (ref 22–32)
CREATININE: 0.78 mg/dL (ref 0.61–1.24)
Chloride: 105 mmol/L (ref 101–111)
GLUCOSE: 111 mg/dL — AB (ref 65–99)
Potassium: 3.8 mmol/L (ref 3.5–5.1)
Sodium: 141 mmol/L (ref 135–145)

## 2015-11-22 LAB — PROTIME-INR
INR: 2.28 — ABNORMAL HIGH (ref 0.00–1.49)
PROTHROMBIN TIME: 24.9 s — AB (ref 11.6–15.2)

## 2015-11-22 LAB — HEPATIC FUNCTION PANEL
ALT: 24 U/L (ref 17–63)
AST: 25 U/L (ref 15–41)
Albumin: 3.6 g/dL (ref 3.5–5.0)
Alkaline Phosphatase: 71 U/L (ref 38–126)
BILIRUBIN DIRECT: 0.2 mg/dL (ref 0.1–0.5)
BILIRUBIN INDIRECT: 0.7 mg/dL (ref 0.3–0.9)
BILIRUBIN TOTAL: 0.9 mg/dL (ref 0.3–1.2)
Total Protein: 6.6 g/dL (ref 6.5–8.1)

## 2015-11-22 NOTE — Telephone Encounter (Signed)
Reviewed instructions - which did have the wrong name [employer] instead of [name]) all other information was correct (DOB), reason for warfarin therapy, weight and dosing instructions.  Called patient an advised to continue as planned.

## 2015-11-26 ENCOUNTER — Ambulatory Visit (HOSPITAL_COMMUNITY)
Admission: RE | Admit: 2015-11-26 | Discharge: 2015-11-26 | Disposition: A | Discharge status: Home / Self Care | Payer: Medicare Other | Source: Ambulatory Visit | Attending: General Surgery | Admitting: General Surgery

## 2015-11-26 ENCOUNTER — Ambulatory Visit (HOSPITAL_COMMUNITY): Payer: Medicare Other | Admitting: Anesthesiology

## 2015-11-26 ENCOUNTER — Encounter (HOSPITAL_COMMUNITY): Payer: Self-pay | Admitting: *Deleted

## 2015-11-26 ENCOUNTER — Encounter (HOSPITAL_COMMUNITY)
Admission: RE | Disposition: A | Discharge status: Home / Self Care | Payer: Self-pay | Source: Ambulatory Visit | Attending: General Surgery

## 2015-11-26 DIAGNOSIS — K851 Biliary acute pancreatitis without necrosis or infection: Secondary | ICD-10-CM | POA: Diagnosis not present

## 2015-11-26 DIAGNOSIS — Z79899 Other long term (current) drug therapy: Secondary | ICD-10-CM | POA: Insufficient documentation

## 2015-11-26 DIAGNOSIS — Z6825 Body mass index (BMI) 25.0-25.9, adult: Secondary | ICD-10-CM | POA: Diagnosis not present

## 2015-11-26 DIAGNOSIS — M109 Gout, unspecified: Secondary | ICD-10-CM | POA: Insufficient documentation

## 2015-11-26 DIAGNOSIS — I739 Peripheral vascular disease, unspecified: Secondary | ICD-10-CM | POA: Diagnosis not present

## 2015-11-26 DIAGNOSIS — K8010 Calculus of gallbladder with chronic cholecystitis without obstruction: Secondary | ICD-10-CM | POA: Insufficient documentation

## 2015-11-26 DIAGNOSIS — E119 Type 2 diabetes mellitus without complications: Secondary | ICD-10-CM | POA: Diagnosis not present

## 2015-11-26 DIAGNOSIS — Z7984 Long term (current) use of oral hypoglycemic drugs: Secondary | ICD-10-CM | POA: Insufficient documentation

## 2015-11-26 DIAGNOSIS — Z7901 Long term (current) use of anticoagulants: Secondary | ICD-10-CM | POA: Diagnosis not present

## 2015-11-26 DIAGNOSIS — Z8546 Personal history of malignant neoplasm of prostate: Secondary | ICD-10-CM | POA: Insufficient documentation

## 2015-11-26 DIAGNOSIS — Z86718 Personal history of other venous thrombosis and embolism: Secondary | ICD-10-CM | POA: Insufficient documentation

## 2015-11-26 DIAGNOSIS — E669 Obesity, unspecified: Secondary | ICD-10-CM | POA: Insufficient documentation

## 2015-11-26 DIAGNOSIS — I10 Essential (primary) hypertension: Secondary | ICD-10-CM | POA: Insufficient documentation

## 2015-11-26 DIAGNOSIS — Z87891 Personal history of nicotine dependence: Secondary | ICD-10-CM | POA: Diagnosis not present

## 2015-11-26 DIAGNOSIS — K429 Umbilical hernia without obstruction or gangrene: Secondary | ICD-10-CM | POA: Diagnosis not present

## 2015-11-26 DIAGNOSIS — K8020 Calculus of gallbladder without cholecystitis without obstruction: Secondary | ICD-10-CM | POA: Diagnosis not present

## 2015-11-26 HISTORY — PX: UMBILICAL HERNIA REPAIR: SHX196

## 2015-11-26 HISTORY — PX: CHOLECYSTECTOMY: SHX55

## 2015-11-26 LAB — GLUCOSE, CAPILLARY: GLUCOSE-CAPILLARY: 89 mg/dL (ref 65–99)

## 2015-11-26 SURGERY — LAPAROSCOPIC CHOLECYSTECTOMY
Anesthesia: General | Site: Abdomen

## 2015-11-26 MED ORDER — FENTANYL CITRATE (PF) 100 MCG/2ML IJ SOLN
INTRAMUSCULAR | Status: DC | PRN
  Administered 2015-11-26: 50 ug via INTRAVENOUS

## 2015-11-26 MED ORDER — LIDOCAINE HCL (PF) 1 % IJ SOLN
INTRAMUSCULAR | Status: AC
  Filled 2015-11-26: qty 5

## 2015-11-26 MED ORDER — POVIDONE-IODINE 10 % OINT PACKET
TOPICAL_OINTMENT | CUTANEOUS | Status: DC | PRN
  Administered 2015-11-26: 1 via TOPICAL

## 2015-11-26 MED ORDER — BUPIVACAINE HCL (PF) 0.5 % IJ SOLN
INTRAMUSCULAR | Status: DC | PRN
  Administered 2015-11-26: 10 mL

## 2015-11-26 MED ORDER — HYDRALAZINE HCL 20 MG/ML IJ SOLN
20.0000 mg | Freq: Once | INTRAMUSCULAR | Status: AC
  Administered 2015-11-26: 20 mg via INTRAVENOUS

## 2015-11-26 MED ORDER — MIDAZOLAM HCL 2 MG/2ML IJ SOLN
INTRAMUSCULAR | Status: AC
  Filled 2015-11-26: qty 2

## 2015-11-26 MED ORDER — KETOROLAC TROMETHAMINE 30 MG/ML IJ SOLN
30.0000 mg | Freq: Once | INTRAMUSCULAR | Status: AC
  Administered 2015-11-26: 30 mg via INTRAVENOUS
  Filled 2015-11-26: qty 1

## 2015-11-26 MED ORDER — CIPROFLOXACIN IN D5W 400 MG/200ML IV SOLN
400.0000 mg | INTRAVENOUS | Status: AC
  Administered 2015-11-26: 400 mg via INTRAVENOUS
  Filled 2015-11-26: qty 200

## 2015-11-26 MED ORDER — GLYCOPYRROLATE 0.2 MG/ML IJ SOLN
INTRAMUSCULAR | Status: AC
  Filled 2015-11-26: qty 3

## 2015-11-26 MED ORDER — NEOSTIGMINE METHYLSULFATE 10 MG/10ML IV SOLN
INTRAVENOUS | Status: DC | PRN
  Administered 2015-11-26: 1 mg via INTRAVENOUS
  Administered 2015-11-26: 2 mg via INTRAVENOUS
  Administered 2015-11-26: 1 mg via INTRAVENOUS

## 2015-11-26 MED ORDER — LACTATED RINGERS IV SOLN
INTRAVENOUS | Status: DC
  Administered 2015-11-26: 1000 mL via INTRAVENOUS
  Administered 2015-11-26: 08:00:00 via INTRAVENOUS

## 2015-11-26 MED ORDER — HYDRALAZINE HCL 20 MG/ML IJ SOLN
INTRAMUSCULAR | Status: AC
  Filled 2015-11-26: qty 1

## 2015-11-26 MED ORDER — GLYCOPYRROLATE 0.2 MG/ML IJ SOLN
INTRAMUSCULAR | Status: DC | PRN
  Administered 2015-11-26: 0.4 mg via INTRAVENOUS

## 2015-11-26 MED ORDER — POVIDONE-IODINE 10 % EX OINT
TOPICAL_OINTMENT | CUTANEOUS | Status: AC
  Filled 2015-11-26: qty 1

## 2015-11-26 MED ORDER — SODIUM CHLORIDE 0.9 % IJ SOLN
INTRAMUSCULAR | Status: AC
Start: 2015-11-26 — End: 2015-11-26
  Filled 2015-11-26: qty 10

## 2015-11-26 MED ORDER — FENTANYL CITRATE (PF) 100 MCG/2ML IJ SOLN: 25.0000 ug | INTRAMUSCULAR | Status: DC | PRN

## 2015-11-26 MED ORDER — EPHEDRINE SULFATE 50 MG/ML IJ SOLN
INTRAMUSCULAR | Status: AC
  Filled 2015-11-26: qty 1

## 2015-11-26 MED ORDER — HYDROCODONE-ACETAMINOPHEN 5-325 MG PO TABS: 1.0000 | ORAL_TABLET | ORAL | Status: DC | PRN

## 2015-11-26 MED ORDER — ONDANSETRON HCL 4 MG/2ML IJ SOLN: 4.0000 mg | Freq: Once | INTRAMUSCULAR | Status: DC | PRN

## 2015-11-26 MED ORDER — BUPIVACAINE HCL (PF) 0.5 % IJ SOLN
INTRAMUSCULAR | Status: AC
  Filled 2015-11-26: qty 30

## 2015-11-26 MED ORDER — CHLORHEXIDINE GLUCONATE 4 % EX LIQD
1.0000 | Freq: Once | CUTANEOUS | Status: DC
Start: 2015-11-26 — End: 2015-11-26

## 2015-11-26 MED ORDER — ROCURONIUM BROMIDE 50 MG/5ML IV SOLN
INTRAVENOUS | Status: AC
  Filled 2015-11-26: qty 1

## 2015-11-26 MED ORDER — MIDAZOLAM HCL 5 MG/5ML IJ SOLN
INTRAMUSCULAR | Status: DC | PRN
  Administered 2015-11-26: 2 mg via INTRAVENOUS

## 2015-11-26 MED ORDER — NEOSTIGMINE METHYLSULFATE 10 MG/10ML IV SOLN
INTRAVENOUS | Status: AC
  Filled 2015-11-26: qty 1

## 2015-11-26 MED ORDER — METOPROLOL TARTRATE 1 MG/ML IV SOLN
5.0000 mg | Freq: Once | INTRAVENOUS | Status: AC
  Administered 2015-11-26: 5 mg via INTRAVENOUS

## 2015-11-26 MED ORDER — MIDAZOLAM HCL 2 MG/2ML IJ SOLN
INTRAMUSCULAR | Status: AC
Start: 2015-11-26 — End: 2015-11-26
  Filled 2015-11-26: qty 2

## 2015-11-26 MED ORDER — METOPROLOL TARTRATE 1 MG/ML IV SOLN
INTRAVENOUS | Status: AC
  Filled 2015-11-26: qty 5

## 2015-11-26 MED ORDER — MIDAZOLAM HCL 2 MG/2ML IJ SOLN
1.0000 mg | INTRAMUSCULAR | Status: DC | PRN
  Administered 2015-11-26: 2 mg via INTRAVENOUS

## 2015-11-26 MED ORDER — FENTANYL CITRATE (PF) 250 MCG/5ML IJ SOLN
INTRAMUSCULAR | Status: AC
  Filled 2015-11-26: qty 5

## 2015-11-26 MED ORDER — SUCCINYLCHOLINE CHLORIDE 20 MG/ML IJ SOLN
INTRAMUSCULAR | Status: AC
  Filled 2015-11-26: qty 1

## 2015-11-26 MED ORDER — SODIUM CHLORIDE 0.9 % IR SOLN
Status: DC | PRN
  Administered 2015-11-26: 1000 mL

## 2015-11-26 MED ORDER — HEMOSTATIC AGENTS (NO CHARGE) OPTIME
TOPICAL | Status: DC | PRN
  Administered 2015-11-26: 1 via TOPICAL

## 2015-11-26 MED ORDER — ROCURONIUM BROMIDE 100 MG/10ML IV SOLN
INTRAVENOUS | Status: DC | PRN
  Administered 2015-11-26: 20 mg via INTRAVENOUS

## 2015-11-26 MED ORDER — PROPOFOL 10 MG/ML IV BOLUS
INTRAVENOUS | Status: AC
  Filled 2015-11-26: qty 20

## 2015-11-26 SURGICAL SUPPLY — 45 items
APPLIER CLIP LAPSCP 10X32 DD (CLIP) ×3 IMPLANT
BAG HAMPER (MISCELLANEOUS) ×3 IMPLANT
CHLORAPREP W/TINT 26ML (MISCELLANEOUS) ×3 IMPLANT
CLOTH BEACON ORANGE TIMEOUT ST (SAFETY) ×3 IMPLANT
COVER LIGHT HANDLE STERIS (MISCELLANEOUS) ×6 IMPLANT
DECANTER SPIKE VIAL GLASS SM (MISCELLANEOUS) ×3 IMPLANT
ELECT REM PT RETURN 9FT ADLT (ELECTROSURGICAL) ×3
ELECTRODE REM PT RTRN 9FT ADLT (ELECTROSURGICAL) ×1 IMPLANT
FILTER SMOKE EVAC LAPAROSHD (FILTER) ×3 IMPLANT
FORMALIN 10 PREFIL 120ML (MISCELLANEOUS) ×3 IMPLANT
GLOVE BIOGEL M 7.0 STRL (GLOVE) ×6 IMPLANT
GLOVE BIOGEL PI IND STRL 7.0 (GLOVE) ×1 IMPLANT
GLOVE BIOGEL PI INDICATOR 7.0 (GLOVE) ×2
GLOVE EXAM NITRILE MD LF STRL (GLOVE) ×3 IMPLANT
GLOVE SURG SS PI 7.5 STRL IVOR (GLOVE) ×3 IMPLANT
GOWN STRL REUS W/ TWL XL LVL3 (GOWN DISPOSABLE) ×1 IMPLANT
GOWN STRL REUS W/TWL LRG LVL3 (GOWN DISPOSABLE) ×6 IMPLANT
GOWN STRL REUS W/TWL XL LVL3 (GOWN DISPOSABLE) ×2
HEMOSTAT SNOW SURGICEL 2X4 (HEMOSTASIS) ×3 IMPLANT
INST SET LAPROSCOPIC AP (KITS) ×3 IMPLANT
IV NS IRRIG 3000ML ARTHROMATIC (IV SOLUTION) IMPLANT
KIT ROOM TURNOVER APOR (KITS) ×3 IMPLANT
MANIFOLD NEPTUNE II (INSTRUMENTS) ×3 IMPLANT
NEEDLE INSUFFLATION 14GA 120MM (NEEDLE) ×3 IMPLANT
NS IRRIG 1000ML POUR BTL (IV SOLUTION) ×3 IMPLANT
PACK LAP CHOLE LZT030E (CUSTOM PROCEDURE TRAY) ×3 IMPLANT
PAD ARMBOARD 7.5X6 YLW CONV (MISCELLANEOUS) ×3 IMPLANT
PENCIL HANDSWITCHING (ELECTRODE) ×3 IMPLANT
POUCH SPECIMEN RETRIEVAL 10MM (ENDOMECHANICALS) ×3 IMPLANT
SET BASIN LINEN APH (SET/KITS/TRAYS/PACK) ×3 IMPLANT
SET TUBE IRRIG SUCTION NO TIP (IRRIGATION / IRRIGATOR) IMPLANT
SLEEVE ENDOPATH XCEL 5M (ENDOMECHANICALS) ×3 IMPLANT
SPONGE GAUZE 2X2 8PLY STER LF (GAUZE/BANDAGES/DRESSINGS) ×4
SPONGE GAUZE 2X2 8PLY STRL LF (GAUZE/BANDAGES/DRESSINGS) ×8 IMPLANT
STAPLER VISISTAT (STAPLE) ×3 IMPLANT
SUT ETHIBOND NAB MO 7 #0 18IN (SUTURE) ×3 IMPLANT
SUT VIC AB 2-0 CT2 27 (SUTURE) ×3 IMPLANT
SUT VICRYL 0 UR6 27IN ABS (SUTURE) ×3 IMPLANT
TAPE CLOTH SURG 4X10 WHT LF (GAUZE/BANDAGES/DRESSINGS) ×3 IMPLANT
TROCAR ENDO BLADELESS 11MM (ENDOMECHANICALS) ×3 IMPLANT
TROCAR XCEL NON-BLD 5MMX100MML (ENDOMECHANICALS) ×3 IMPLANT
TROCAR XCEL UNIV SLVE 11M 100M (ENDOMECHANICALS) ×3 IMPLANT
TUBING INSUFFLATION (TUBING) ×3 IMPLANT
WARMER LAPAROSCOPE (MISCELLANEOUS) ×3 IMPLANT
YANKAUER SUCT 12FT TUBE ARGYLE (SUCTIONS) ×3 IMPLANT

## 2015-11-26 NOTE — Anesthesia Preprocedure Evaluation (Signed)
Anesthesia Evaluation  Patient identified by MRN, date of birth, ID band Patient awake    Reviewed: Allergy & Precautions, NPO status , Patient's Chart, lab work & pertinent test results, reviewed documented beta blocker date and time   Airway Mallampati: II  TM Distance: >3 FB Neck ROM: Full    Dental  (+) Missing, Poor Dentition, Chipped,    Pulmonary former smoker,    Pulmonary exam normal        Cardiovascular hypertension, Pt. on medications and Pt. on home beta blockers + Peripheral Vascular Disease  Normal cardiovascular exam     Neuro/Psych    GI/Hepatic   Endo/Other  diabetes, Well Controlled, Type 2  Renal/GU      Musculoskeletal   Abdominal (+) + obese,   Peds  Hematology   Anesthesia Other Findings   Reproductive/Obstetrics                             Anesthesia Physical Anesthesia Plan  ASA: III  Anesthesia Plan: General   Post-op Pain Management:    Induction: Rapid sequence and Intravenous  Airway Management Planned: Oral ETT  Additional Equipment:   Intra-op Plan:   Post-operative Plan: Extubation in OR  Informed Consent: I have reviewed the patients History and Physical, chart, labs and discussed the procedure including the risks, benefits and alternatives for the proposed anesthesia with the patient or authorized representative who has indicated his/her understanding and acceptance.   Dental advisory given  Plan Discussed with: CRNA  Anesthesia Plan Comments:         Anesthesia Quick Evaluation

## 2015-11-26 NOTE — Progress Notes (Signed)
Dr Milford Cage at bedside to check pt. Cleared for d/c to SDS.

## 2015-11-26 NOTE — Anesthesia Postprocedure Evaluation (Signed)
Anesthesia Post Note  Patient: Alan [name].  Procedure(s) Performed: Procedure(s) (LRB): LAPAROSCOPIC CHOLECYSTECTOMY (N/A) UMBILICAL HERNIORRHAPHY (N/A)  Patient location during evaluation: PACU Anesthesia Type: General Level of consciousness: awake and alert and patient cooperative Pain management: pain level controlled Vital Signs Assessment: post-procedure vital signs reviewed and stable Respiratory status: spontaneous breathing and patient connected to face mask oxygen Cardiovascular status: stable Anesthetic complications: no    Last Vitals:  Filed Vitals:   11/26/15 0938 11/26/15 0945  BP: 188/77 191/80  Pulse: 70 67  Temp: 36.8 C   Resp: 18 18    Last Pain: There were no vitals filed for this visit.               Kody Vigil J

## 2015-11-26 NOTE — Interval H&P Note (Signed)
History and Physical Interval Note:  11/26/2015 7:57 AM  [name]  has presented today for surgery, with the diagnosis of cholelithiasis  The various methods of treatment have been discussed with the patient and family. After consideration of risks, benefits and other options for treatment, the patient has consented to  Procedure(s): LAPAROSCOPIC CHOLECYSTECTOMY (N/A) as a surgical intervention .  The patient's history has been reviewed, patient examined, no change in status, stable for surgery.  I have reviewed the patient's chart and labs.  Questions were answered to the patient's satisfaction.     Aviva Signs A

## 2015-11-26 NOTE — Progress Notes (Signed)
BP 180/70. Dr Milford Cage at bedside to check pt. Order given for elevated BP.

## 2015-11-26 NOTE — Discharge Instructions (Signed)
Restart lovenox and coumadin tomorrow.  Laparoscopic Cholecystectomy, Care After Refer to this sheet in the next few weeks. These instructions provide you with information about caring for yourself after your procedure. Your health care provider may also give you more specific instructions. Your treatment has been planned according to current medical practices, but problems sometimes occur. Call your health care provider if you have any problems or questions after your procedure. WHAT TO EXPECT AFTER THE PROCEDURE After your procedure, it is common to have:  Pain at your incision sites. You will be given pain medicines to control your pain.  Mild nausea or vomiting. This should improve after the first 24 hours.  Bloating and possible shoulder pain from the gas that was used during the procedure. This will improve after the first 24 hours. HOME CARE INSTRUCTIONS Incision Care  Follow instructions from your health care provider about how to take care of your incisions. Make sure you:  Wash your hands with soap and water before you change your bandage (dressing). If soap and water are not available, use hand sanitizer.  Change your dressing as told by your health care provider.  Leave stitches (sutures), skin glue, or adhesive strips in place. These skin closures may need to be in place for 2 weeks or longer. If adhesive strip edges start to loosen and curl up, you may trim the loose edges. Do not remove adhesive strips completely unless your health care provider tells you to do that.  Do not take baths, swim, or use a hot tub until your health care provider approves. Ask your health care provider if you can take showers. You may only be allowed to take sponge baths for bathing. General Instructions  Take over-the-counter and prescription medicines only as told by your health care provider.  Do not drive or operate heavy machinery while taking prescription pain medicine.  Return to your  normal diet as told by your health care provider.  Do not lift anything that is heavier than 10 lb (4.5 kg).  Do not play contact sports for one week or until your health care provider approves. SEEK MEDICAL CARE IF:   You have redness, swelling, or pain at the site of your incision.  You have fluid, blood, or pus coming from your incision.  You notice a bad smell coming from your incision area.  Your surgical incisions break open.  You have a fever. SEEK IMMEDIATE MEDICAL CARE IF:  You develop a rash.  You have difficulty breathing.  You have chest pain.  You have increasing pain in your shoulders (shoulder strap areas).  You faint or have dizzy episodes while you are standing.  You have severe pain in your abdomen.  You have nausea or vomiting that lasts for more than one day.   This information is not intended to replace advice given to you by your health care provider. Make sure you discuss any questions you have with your health care provider.   Document Released: 11/27/2005 Document Revised: 08/18/2015 Document Reviewed: 07/09/2013 Elsevier Interactive Patient Education [employer].

## 2015-11-26 NOTE — Transfer of Care (Signed)
Immediate Anesthesia Transfer of Care Note  Patient: Alan [name].  Procedure(s) Performed: Procedure(s): LAPAROSCOPIC CHOLECYSTECTOMY (N/A) UMBILICAL HERNIORRHAPHY (N/A)  Patient Location: PACU  Anesthesia Type:General  Level of Consciousness: sedated  Airway & Oxygen Therapy: Patient Spontanous Breathing and Patient connected to face mask oxygen  Post-op Assessment: Report given to RN and Post -op Vital signs reviewed and stable  Post vital signs: Reviewed and stable  Last Vitals:  Filed Vitals:   11/26/15 0805 11/26/15 0806  BP: 155/70   Pulse:    Temp:  37 C  Resp: 24     Complications: No apparent anesthesia complications

## 2015-11-26 NOTE — Anesthesia Procedure Notes (Signed)
Procedure Name: Intubation Date/Time: 11/26/2015 8:27 AM Performed by: Charmaine Downs Pre-anesthesia Checklist: Emergency Drugs available, Patient identified, Suction available and Patient being monitored Patient Re-evaluated:Patient Re-evaluated prior to inductionOxygen Delivery Method: Circle system utilized Preoxygenation: Pre-oxygenation with 100% oxygen Intubation Type: IV induction, Rapid sequence and Cricoid Pressure applied Ventilation: Mask ventilation without difficulty Laryngoscope Size: Mac and 3 Grade View: Grade III Tube type: Oral Tube size: 8.0 mm Number of attempts: 1 Airway Equipment and Method: Stylet Placement Confirmation: positive ETCO2 and breath sounds checked- equal and bilateral Secured at: 24 cm Tube secured with: Tape Dental Injury: Teeth and Oropharynx as per pre-operative assessment  Difficulty Due To: Difficulty was anticipated and Difficult Airway- due to anterior larynx Future Recommendations: Recommend- induction with short-acting agent, and alternative techniques readily available

## 2015-11-26 NOTE — Op Note (Signed)
Patient:  Alan [name].  DOB:  1941-09-14  MRN:  [id number]   Preop Diagnosis:  Cholelithiasis, history of gallstone pancreatitis, umbilical hernia  Postop Diagnosis:  Same  Procedure:  Laparoscopic cholecystectomy, umbilical herniorrhaphy  Surgeon:  Aviva Signs, M.D.  Anes:  Gen. endotracheal  Indications:  Patient is a 74 year old black male who has had multiple episodes biliary colic and a hospitalization for pancreatitis. Recent ultrasound revealed cholelithiasis. He also has an umbilical hernia. The risks and benefits of the procedures including bleeding, infection, hepatobiliary injury, and the possibility of an open procedure were fully explained to the patient, who gave informed consent.  Procedure note:  The patient was placed the supine position. After induction of general endotracheal anesthesia, the abdomen was prepped and draped using the usual sterile technique with DuraPrep. Surgical site confirmation was performed.  A supraumbilical incision was made down to the fascia. Using the hernia defect that was ordered present in this region, the abdomen was entered into directly without difficulty. An 11 mm trocar was introduced into the abdominal cavity and the abdomen was insufflated to 16 mmHg pressure. The patient was placed in reverse Trendelenburg position and additional 11 mm trocar was placed the epigastric region and 5 mm trochars were placed the right upper quadrant and right flank regions. Liver was inspected and noted to be within normal limits. The gallbladder and a thickened wall and was full of stones. The gallbladder was retracted in a dynamic fashion in order to provide a critical view of the triangle of Calot. The cystic duct was first identified. Its junction to the infundibulum was fully identified. Endoclips placed proximally and distally on the cystic duct, and the cystic duct was divided. This was likewise done the cystic artery. The gallbladder was freed away from  the gallbladder fossa using Bovie electrocautery. The gallbladder was delivered through the epigastric trocar site using an Endo Catch bag. The gallbladder fossa was inspected and no abnormal bleeding or bile leakage was noted. Surgicel was placed the gallbladder fossa. All fluid and air were then evacuated from the abdominal cavity prior to removal of the trochars.  I next addressed the umbilical hernia. The umbilicus was freed away from the underlying hernia sac. The hernia sac was then excised down to the fascia. The defect measured approximately 2 cm in its greatest diameter. It was closed transversely using 0 Ethibond interrupted sutures. The umbilicus was secured back to the fascia using a 2-0 Vicryl suture. The epigastric fascia was reapproximated using 0 Vicryl interrupted sutures. All skin incisions were injected with 0.5% Sensorcaine. All incisions were closed using staples. Betadine ointment and dry sterile dressings were applied.  All tape and needle counts were correct at the end of the procedure. Patient was extubated in the operating room and transferred to PACU in stable condition.  Complications:  None  EBL:  Minimal  Specimen:  Gallbladder

## 2015-11-29 ENCOUNTER — Encounter (HOSPITAL_COMMUNITY): Payer: Self-pay | Admitting: General Surgery

## 2015-12-07 ENCOUNTER — Telehealth: Payer: Self-pay | Admitting: Pharmacist

## 2015-12-08 NOTE — Telephone Encounter (Signed)
Patient is to restart warfarin - take 10mg  for 2 days, then restart usual dose of 6mg  dialy except 5mg  on MWF.  Appt to check INR 12/15/14 .

## 2015-12-09 ENCOUNTER — Telehealth: Payer: Self-pay | Admitting: Family Medicine

## 2015-12-09 NOTE — Telephone Encounter (Signed)
Patient called back and given instruction discussed with PCP - Dr Dettinger Patient is to restart warfarin - take 10mg  for 2 days, then restart usual dose of 6mg  daily except 5mg  on MWF.  Appt to check INR and AWV 12/15/14 .

## 2015-12-16 ENCOUNTER — Ambulatory Visit (INDEPENDENT_AMBULATORY_CARE_PROVIDER_SITE_OTHER): Payer: Medicare Other | Admitting: Pharmacist

## 2015-12-16 ENCOUNTER — Encounter: Payer: Self-pay | Admitting: Pharmacist

## 2015-12-16 VITALS — BP 134/62 | HR 64 | Ht 65.5 in | Wt 158.5 lb

## 2015-12-16 DIAGNOSIS — Z0000 Encounter for general adult medical examination without abnormal findings: Secondary | ICD-10-CM | POA: Diagnosis not present

## 2015-12-16 DIAGNOSIS — I748 Embolism and thrombosis of other arteries: Secondary | ICD-10-CM | POA: Diagnosis not present

## 2015-12-16 LAB — POCT INR: INR: 1.7

## 2015-12-16 NOTE — Progress Notes (Signed)
Subjective:   Alan [name]. is a 75 y.o. black male who presents for a subsequent Medicare Annual Wellness Visit and recheck INR  Patient is married and lives with his wife and 23yo granddaughter.  He is a retired Air traffic controller.  Review of Systems  Review of Systems  Constitutional: Negative.   HENT: Negative.   Eyes: Negative.   Respiratory: Negative.   Cardiovascular: Negative.   Gastrointestinal: Positive for constipation (occurs about once a week and resolves without medication).  Genitourinary: Negative.   Musculoskeletal: Negative.   Skin: Negative.   Neurological: Negative.   Endo/Heme/Allergies: Negative.   Psychiatric/Behavioral: Negative.       Current Medications (verified) Outpatient Encounter Prescriptions as of 12/16/2015  Medication Sig  . amLODipine (NORVASC) 10 MG tablet Take 1 tablet (10 mg total) by mouth every evening.  . benazepril (LOTENSIN) 40 MG tablet TAKE ONE TABLET BY MOUTH ONCE DAILY  . febuxostat (ULORIC) 40 MG tablet Take 2 tablets (80 mg total) by mouth daily.  . finasteride (PROSCAR) 5 MG tablet Take 5 mg by mouth daily.  . fluticasone (FLONASE) 50 MCG/ACT nasal spray Place 1 spray into both nostrils 2 (two) times daily as needed for allergies or rhinitis.  Marland Kitchen glucose blood (ONE TOUCH TEST STRIPS) test strip Test QD. DX E11.9  . hydrALAZINE (APRESOLINE) 50 MG tablet Take 1 tablet (50 mg total) by mouth 3 (three) times daily.  . Lancets (ONETOUCH ULTRASOFT) lancets Test blood sugar qd. DX E11.9  . levocetirizine (XYZAL) 5 MG tablet Take 1 tablet (5 mg total) by mouth every evening.  . metFORMIN (GLUCOPHAGE) 500 MG tablet Take 1 tablet (500 mg total) by mouth 2 (two) times daily with a meal.  . metoprolol succinate (TOPROL-XL) 100 MG 24 hr tablet Take 1 tablet (100 mg total) by mouth 1 day or 1 dose. (Patient taking differently: Take 100 mg by mouth daily. )  . niacin (NIASPAN) 1000 MG CR tablet Take 1 tablet (1,000 mg total) by mouth at  bedtime.  . pravastatin (PRAVACHOL) 40 MG tablet Take 40 mg by mouth daily.  Marland Kitchen warfarin (COUMADIN) 1 MG tablet Take 1 tablet (1 mg total) by mouth daily.  Marland Kitchen warfarin (COUMADIN) 5 MG tablet Take 1 tablet (5 mg total) by mouth daily at 6 PM.  . furosemide (LASIX) 40 MG tablet Take 1 tablet (40 mg total) by mouth daily as needed for fluid. (Patient not taking: Reported on 12/16/2015)  . [DISCONTINUED] enoxaparin (LOVENOX) 80 MG/0.8ML injection Expel 0.1 ml then inject 0.79ml (=70mg ) every 12 hours. (Patient not taking: Reported on 12/16/2015)  . [DISCONTINUED] HYDROcodone-acetaminophen (NORCO/VICODIN) 5-325 MG tablet Take 1 tablet by mouth every 4 (four) hours as needed for moderate pain. (Patient not taking: Reported on 12/16/2015)   No facility-administered encounter medications on file as of 12/16/2015.    Allergies (verified) Allopurinol and Colchicine   History: Past Medical History  Diagnosis Date  . Hypertension   . Heart murmur   . ED (erectile dysfunction)   . Agranulocytosis ([place])   . Diabetes mellitus   . Hyperlipidemia   . Gout   . Adenomatous colon polyp   . Prostate cancer ([place])     Followed by Dr. Jeffie Pollock   . Pancreatitis   . Gallstones   . Allergy    Past Surgical History  Procedure Laterality Date  . Colonoscopy  2012    [name]: moderate sigmoid diverticulosis, internal hemorrhoids. next tcs 2017  . Sigmoidoscopy    .  Cholecystectomy N/A 11/26/2015    Procedure: LAPAROSCOPIC CHOLECYSTECTOMY;  Surgeon: Aviva Signs, MD;  Location: [facility];  Service: General;  Laterality: N/A;  . Umbilical hernia repair N/A 11/26/2015    Procedure: UMBILICAL HERNIORRHAPHY;  Surgeon: Aviva Signs, MD;  Location: [facility];  Service: General;  Laterality: N/A;   Family History  Problem Relation Age of Onset  . Stroke Mother   . Hypertension Mother   . Alzheimer's disease Mother   . Hypertension Father   . Cancer Sister     brain  . Cancer Sister     breast  . Colon cancer Neg Hx     . Pancreatic disease Neg Hx   . Deep vein thrombosis Brother   . Hypertension Brother    Social History   Occupational History  . Not on file.   Social History Main Topics  . Smoking status: Former Smoker    Types: Cigarettes    Quit date: 12/11/1986  . Smokeless tobacco: Never Used  . Alcohol Use: No  . Drug Use: No  . Sexual Activity: Yes    Do you feel safe at home?  Yes  Dietary issues and exercise activities discussed: Current Exercise Habits:: The patient does not participate in regular exercise at present  Current Dietary habits:  Patient is not following any particular dietary recommendations at this time.  Cardiac Risk Factors include: advanced age (>34men, >28 women);diabetes mellitus;dyslipidemia;hypertension;male gender  Objective:    Today's Vitals   12/16/15 1216  BP: 134/62  Pulse: 64  Height: 5' 5.5" (1.664 m)  Weight: 158 lb 8 oz (71.895 kg)  PainSc: 0-No pain   Body mass index is 25.97 kg/(m^2).   INR was 1.7 today  Warfarin dose = 5mg  MWF and 6mg  all other days. He has stopped for about 2 weeks surrounding surgery 11/27/15.  Just restarted warfarin last week.  Activities of Daily Living In your present state of health, do you have any difficulty performing the following activities: 12/16/2015 11/22/2015  Hearing? N N  Vision? N N  Difficulty concentrating or making decisions? N N  Walking or climbing stairs? N N  Dressing or bathing? N N  Doing errands, shopping? N N  Preparing Food and eating ? N -  Using the Toilet? N -  In the past six months, have you accidently leaked urine? N -  Do you have problems with loss of bowel control? N -  Managing your Medications? N -  Managing your Finances? N -  Housekeeping or managing your Housekeeping? N -    Are there smokers in your home (other than you)? No    Depression Screen PHQ 2/9 Scores 12/16/2015 11/05/2015 10/25/2015 09/08/2015  PHQ - 2 Score 0 0 0 0    Fall Risk Fall Risk  12/16/2015  10/25/2015 09/08/2015 11/12/2014 08/11/2014  Falls in the past year? No No No No Yes  Number falls in past yr: - - - - 1  Injury with Fall? - - - - No  Risk for fall due to : - - - - -  Risk for fall due to (comments): - - - - -    Cognitive Function: MMSE - Mini Mental State Exam 12/16/2015   Orientation to time 5   Orientation to Place 5   Registration 3   Attention/ Calculation 4   Recall 3   Language- name 2 objects 2   Language- repeat 1   Language- follow 3 step command 3   Language-  read & follow direction 1   Write a sentence 1   Copy design 1   Total score 29     Immunizations and Health Maintenance Immunization History  Administered Date(s) Administered  . Influenza,inj,Quad PF,36+ Mos 10/02/2013, 11/12/2014, 09/08/2015  . Pneumococcal Conjugate-13 03/08/2015   Health Maintenance Due  Topic Date Due  . OPHTHALMOLOGY EXAM  07/31/2013    Patient Care Team: Wardell Honour, MD as PCP - General (Family Medicine) Daneil Dolin, MD as Consulting Physician (Gastroenterology) Irine Seal, MD as Attending Physician (Urology)   Optometrist is Dr Rona Ravens at [facility] any recent Medical Services you may have received from other than [facility] providers in the past year (date may be approximate).    Assessment:    Annual Wellness Visit  Subtherapeutic anticoagulation - related to holding prior to surgery and starting several days after surgery.   Screening Tests Health Maintenance  Topic Date Due  . OPHTHALMOLOGY EXAM  07/31/2013  . ZOSTAVAX  05/07/2016 (Originally 01/20/2003)  . HEMOGLOBIN A1C  03/07/2016  . PNA vac Low Risk Adult (2 of 2 - PPSV23) 03/07/2016  . INFLUENZA VACCINE  07/11/2016  . COLONOSCOPY  08/23/2016  . FOOT EXAM  09/07/2016  . TETANUS/TDAP  12/11/2022        Plan:   During the course of the visit Romas was educated and counseled about the following appropriate screening and preventive services:   Vaccines to include Pneumoccal, Influenza,  Td - UTD;  Checked on price of Zostavax and would be $231.24 - patient will postpone due to cost  Colorectal cancer screening - patient is due colonoscopy 08/2016.    Cardiovascular disease screening - ECHO was done 10/2015  Due lipid recheck - has appt in 2 weeks for labs with PCP  Diabetes - last A1c was excellent at 5.8% - continue metformin 500mg  bid  Glaucoma screening / Diabetic Eye Exam - patient is due and he is aware - he declined for me to make appt for him but states he will call to make appt  Nutrition counseling - discussed increasing fiber intake to prevent constipation.  Also reviewed foods and CHO content.  Patient to limit serving sizes of high CHO foods.  Prostate cancer screening - UTD  Advanced Directives - information provided to patient  Increase  Physical activity - patient encouraged to look into Silver Sneaker program.   RTC in 2 weeks to see PCP and check protime  Anticoagulation Dose Instructions as of 12/16/2015      Dorene Grebe Tue Wed Thu Fri Sat   New Dose 6 mg 5 mg 6 mg 5 mg 6 mg 5 mg 6 mg    Description        Take 7mg  today (1 tablet of 5mg  and 2 tablets of 1mg ). Then restart your usual dose of warfarin 5mg  on mondays, wednesdays and fridays and 6mg  all other days.        Patient Instructions (the written plan) were given to the patient.   Cherre Robins, [facility]   12/16/2015

## 2015-12-16 NOTE — Patient Instructions (Addendum)
Anticoagulation Dose Instructions as of 12/16/2015      Alan [name] Tue Wed Thu Fri Sat   New Dose 6 mg 5 mg 6 mg 5 mg 6 mg 5 mg 6 mg    Description        Take 7mg  today - Thursday, January 5th (1 tablet of 5mg  and 2 tablets of 1mg ). Then restart your usual dose of warfarin 5mg  on mondays, wednesdays and fridays and 6mg  all other days.       INR was 1.7 today  Alan [name] , Thank you for taking time to come for your Medicare Wellness Visit. I appreciate your ongoing commitment to your health goals. Please review the following plan we discussed and let me know if I can assist you in the future.   These are the goals we discussed: Consider Silver Sneakers Program for exercise.  Goal exercise is 30 minutes 4 to 5 days per week. Important to have diabetic eye exam once a year - you are past due.  Call myeyedr to schedule appointment - [phone] Try to increase high fiber foods and daily water intake (to help prevent occasional constipation and maintain healthy bowel habits)   This is a list of the screening recommended for you and due dates:  Health Maintenance  Topic Date Due  . Eye exam for diabetics  07/31/2013  . Shingles Vaccine  05/07/2016*  . Hemoglobin A1C  03/07/2016  . Pneumonia vaccines (2 of 2 - PPSV23) 03/07/2016  . Flu Shot  07/11/2016  . Colon Cancer Screening  08/23/2016  . Complete foot exam   09/07/2016  . Tetanus Vaccine  12/11/2022  *Topic was postponed. The date shown is not the original due date.    High-Fiber Diet Fiber, also called dietary fiber, is a type of carbohydrate found in fruits, vegetables, whole grains, and beans. A high-fiber diet can have many health benefits. Your health care provider may recommend a high-fiber diet to help:  Prevent constipation. Fiber can make your bowel movements more regular.  Lower your cholesterol.  Relieve hemorrhoids, uncomplicated diverticulosis, or irritable bowel syndrome.  Prevent overeating as part of a  weight-loss plan.  Prevent heart disease, type 2 diabetes, and certain cancers. WHAT IS MY PLAN? The recommended daily intake of fiber includes:  38 grams for men under age 35.  68 grams for men over age 60.  13 grams for women under age 39.  19 grams for women over age 6. You can get the recommended daily intake of dietary fiber by eating a variety of fruits, vegetables, grains, and beans. Your health care provider may also recommend a fiber supplement if it is not possible to get enough fiber through your diet. WHAT DO I NEED TO KNOW ABOUT A HIGH-FIBER DIET?  Fiber supplements have not been widely studied for their effectiveness, so it is better to get fiber through food sources.  Always check the fiber content on thenutrition facts label of any prepackaged food. Look for foods that contain at least 5 grams of fiber per serving.  Ask your dietitian if you have questions about specific foods that are related to your condition, especially if those foods are not listed in the following section.  Increase your daily fiber consumption gradually. Increasing your intake of dietary fiber too quickly may cause bloating, cramping, or gas.  Drink plenty of water. Water helps you to digest fiber. WHAT FOODS CAN I EAT? Grains Whole-grain breads. Multigrain cereal. Oats and oatmeal. Brown rice. Barley.  Bulgur wheat. [place]. Bran muffins. Popcorn. Rye wafer crackers. Vegetables Sweet potatoes. Spinach. Kale. Artichokes. Cabbage. Broccoli. Green peas. Carrots. Squash. Fruits Berries. Pears. Apples. Oranges. Avocados. Prunes and raisins. Dried figs. Meats and Other Protein Sources [place], kidney, pinto, and soy beans. Split peas. Lentils. Nuts and seeds. Dairy Fiber-fortified yogurt. Beverages Fiber-fortified soy milk. Fiber-fortified orange juice. Other Fiber bars. The items listed above may not be a complete list of recommended foods or beverages. Contact your dietitian for more  options. WHAT FOODS ARE NOT RECOMMENDED? Grains White bread. Pasta made with refined flour. White rice. Vegetables Fried potatoes. Canned vegetables. Well-cooked vegetables.  Fruits Fruit juice. Cooked, strained fruit. Meats and Other Protein Sources Fatty cuts of meat. Fried Sales executive or fried fish. Dairy Milk. Yogurt. Cream cheese. Sour cream. Beverages Soft drinks. Other Cakes and pastries. Butter and oils. The items listed above may not be a complete list of foods and beverages to avoid. Contact your dietitian for more information. WHAT ARE SOME TIPS FOR INCLUDING HIGH-FIBER FOODS IN MY DIET?  Eat a wide variety of high-fiber foods.  Make sure that half of all grains consumed each day are whole grains.  Replace breads and cereals made from refined flour or white flour with whole-grain breads and cereals.  Replace white rice with brown rice, bulgur wheat, or millet.  Start the day with a breakfast that is high in fiber, such as a cereal that contains at least 5 grams of fiber per serving.  Use beans in place of meat in soups, salads, or pasta.  Eat high-fiber snacks, such as berries, raw vegetables, nuts, or popcorn.   This information is not intended to replace advice given to you by your health care provider. Make sure you discuss any questions you have with your health care provider.   Document Released: 11/27/2005 Document Revised: 12/18/2014 Document Reviewed: 05/12/2014 Elsevier Interactive Patient Education [employer].

## 2016-01-07 ENCOUNTER — Ambulatory Visit (INDEPENDENT_AMBULATORY_CARE_PROVIDER_SITE_OTHER): Payer: Medicare Other | Admitting: Family Medicine

## 2016-01-07 ENCOUNTER — Encounter: Payer: Self-pay | Admitting: Family Medicine

## 2016-01-07 VITALS — BP 138/65 | HR 68 | Temp 97.9°F | Ht 65.5 in | Wt 158.2 lb

## 2016-01-07 DIAGNOSIS — E0811 Diabetes mellitus due to underlying condition with ketoacidosis with coma: Secondary | ICD-10-CM | POA: Diagnosis not present

## 2016-01-07 DIAGNOSIS — E785 Hyperlipidemia, unspecified: Secondary | ICD-10-CM

## 2016-01-07 DIAGNOSIS — I748 Embolism and thrombosis of other arteries: Secondary | ICD-10-CM

## 2016-01-07 LAB — POCT INR: INR: 1.7

## 2016-01-07 LAB — POCT GLYCOSYLATED HEMOGLOBIN (HGB A1C): Hemoglobin A1C: 5.2

## 2016-01-07 MED ORDER — WARFARIN SODIUM 5 MG PO TABS: 5.0000 mg | ORAL_TABLET | Freq: Every day | ORAL | Status: DC

## 2016-01-07 MED ORDER — WARFARIN SODIUM 1 MG PO TABS: 1.0000 mg | ORAL_TABLET | Freq: Every day | ORAL | Status: DC

## 2016-01-07 NOTE — Progress Notes (Signed)
BP 138/65 mmHg  Pulse 68  Temp(Src) 97.9 F (36.6 C) (Oral)  Ht 5' 5.5" (1.664 m)  Wt 158 lb 3.2 oz (71.759 kg)  BMI 25.92 kg/m2   Subjective:    Patient ID: Alan Netters., male    DOB: 23-Aug-1941, 75 y.o.   MRN: [id number]  HPI: Alan Goda. is a 75 y.o. male presenting on 01/07/2016 for PT/INR check per Tammy   HPI Coumadin management follow-up Patient is currently on Coumadin and his INR came back as 1.7. Increased dosages of Coumadin slightly. He is planned to be on Coumadin for 6 months because of the splenic artery thromboembolism  Diabetes Patient is currently on metformin 500 and is due for a A1c check. His A1c came back as 5.2. Patient is currently not checking his blood sugars because we've only have him on low doses. Patient denies headaches, blurred vision, chest pains, shortness of breath, or weakness. Denies any side effects from medication and is content with current medication. He says he had an eye exam near the end of last year. He denies any issues with his feet.  Hyperlipidemia Patient is coming in for a cholesterol recheck today. He is on pravastatin and denies any issues with that.  Relevant past medical, surgical, family and social history reviewed and updated as indicated. Interim medical history since our last visit reviewed. Allergies and medications reviewed and updated.  Review of Systems  Constitutional: Negative for fever and chills.  HENT: Negative for ear discharge and ear pain.   Eyes: Negative for discharge and visual disturbance.  Respiratory: Negative for shortness of breath and wheezing.   Cardiovascular: Negative for chest pain and leg swelling.  Gastrointestinal: Negative for abdominal pain, diarrhea and constipation.  Genitourinary: Negative for difficulty urinating.  Musculoskeletal: Negative for back pain and gait problem.  Skin: Negative for rash.  Neurological: Negative for dizziness, syncope, light-headedness and headaches.  All  other systems reviewed and are negative.   Per HPI unless specifically indicated above     Medication List       This list is accurate as of: 01/07/16 10:53 AM.  Always use your most recent med list.               amLODipine 10 MG tablet  Commonly known as:  NORVASC  Take 1 tablet (10 mg total) by mouth every evening.     benazepril 40 MG tablet  Commonly known as:  LOTENSIN  TAKE ONE TABLET BY MOUTH ONCE DAILY     febuxostat 40 MG tablet  Commonly known as:  ULORIC  Take 2 tablets (80 mg total) by mouth daily.     finasteride 5 MG tablet  Commonly known as:  PROSCAR  Take 5 mg by mouth daily.     fluticasone 50 MCG/ACT nasal spray  Commonly known as:  FLONASE  Place 1 spray into both nostrils 2 (two) times daily as needed for allergies or rhinitis.     furosemide 40 MG tablet  Commonly known as:  LASIX  Take 1 tablet (40 mg total) by mouth daily as needed for fluid.     glucose blood test strip  Commonly known as:  ONE TOUCH TEST STRIPS  Test QD. DX E11.9     hydrALAZINE 50 MG tablet  Commonly known as:  APRESOLINE  Take 1 tablet (50 mg total) by mouth 3 (three) times daily.     levocetirizine 5 MG tablet  Commonly known as:  XYZAL  Take 1 tablet (5 mg total) by mouth every evening.     metFORMIN 500 MG tablet  Commonly known as:  GLUCOPHAGE  Take 1 tablet (500 mg total) by mouth 2 (two) times daily with a meal.     metoprolol succinate 100 MG 24 hr tablet  Commonly known as:  TOPROL-XL  Take 1 tablet (100 mg total) by mouth 1 day or 1 dose.     niacin 1000 MG CR tablet  Commonly known as:  NIASPAN  Take 1 tablet (1,000 mg total) by mouth at bedtime.     onetouch ultrasoft lancets  Test blood sugar qd. DX E11.9     pravastatin 40 MG tablet  Commonly known as:  PRAVACHOL  Take 40 mg by mouth daily.     warfarin 5 MG tablet  Commonly known as:  COUMADIN  Take 1 tablet (5 mg total) by mouth daily at 6 PM.     warfarin 1 MG tablet  Commonly  known as:  COUMADIN  Take 1 tablet (1 mg total) by mouth daily.           Objective:    BP 138/65 mmHg  Pulse 68  Temp(Src) 97.9 F (36.6 C) (Oral)  Ht 5' 5.5" (1.664 m)  Wt 158 lb 3.2 oz (71.759 kg)  BMI 25.92 kg/m2  Wt Readings from Last 3 Encounters:  01/07/16 158 lb 3.2 oz (71.759 kg)  12/16/15 158 lb 8 oz (71.895 kg)  11/26/15 156 lb (70.761 kg)    Physical Exam  Constitutional: He is oriented to person, place, and time. He appears well-developed and well-nourished. No distress.  Eyes: Conjunctivae and EOM are normal. Pupils are equal, round, and reactive to light. Right eye exhibits no discharge. No scleral icterus.  Neck: Neck supple. No thyromegaly present.  Cardiovascular: Normal rate, regular rhythm, normal heart sounds and intact distal pulses.   No murmur heard. Pulmonary/Chest: Effort normal and breath sounds normal. No respiratory distress. He has no wheezes.  Musculoskeletal: Normal range of motion. He exhibits no edema.  Lymphadenopathy:    He has no cervical adenopathy.  Neurological: He is alert and oriented to person, place, and time. Coordination normal.  Skin: Skin is warm and dry. No rash noted. He is not diaphoretic.  Psychiatric: He has a normal mood and affect. His behavior is normal.  Nursing note and vitals reviewed.   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  Counseling provided for all of the vaccine components Orders Placed This Encounter  Procedures  . Lipid panel  .  Ambulatory referral to Ophthalmology  . POCT glycosylated hemoglobin (Hb A1C)    Caryl Pina, MD [facility] 01/07/2016, 10:53 AM

## 2016-01-08 LAB — LIPID PANEL
Chol/HDL Ratio: 2.4 ratio units (ref 0.0–5.0)
Cholesterol, Total: 70 mg/dL — ABNORMAL LOW (ref 100–199)
HDL: 29 mg/dL — ABNORMAL LOW (ref >39)
LDL Calculated: 29 mg/dL (ref 0–99)
Triglycerides: 62 mg/dL (ref 0–149)
VLDL CHOLESTEROL CAL: 12 mg/dL (ref 5–40)

## 2016-01-10 DIAGNOSIS — C61 Malignant neoplasm of prostate: Secondary | ICD-10-CM | POA: Diagnosis not present

## 2016-01-21 ENCOUNTER — Other Ambulatory Visit: Payer: Self-pay

## 2016-01-21 ENCOUNTER — Telehealth: Payer: Self-pay | Admitting: Pharmacist

## 2016-01-21 ENCOUNTER — Ambulatory Visit (INDEPENDENT_AMBULATORY_CARE_PROVIDER_SITE_OTHER): Payer: Medicare Other | Admitting: Pharmacist

## 2016-01-21 DIAGNOSIS — I748 Embolism and thrombosis of other arteries: Secondary | ICD-10-CM

## 2016-01-21 LAB — POCT INR: INR: 1.9

## 2016-01-21 MED ORDER — HYDRALAZINE HCL 50 MG PO TABS: 50.0000 mg | ORAL_TABLET | Freq: Three times a day (TID) | ORAL | Status: DC

## 2016-01-21 NOTE — Patient Instructions (Signed)
        Dorene Grebe Tue Wed Thu Fri Sat   February 2017      2/10 6mg  2/11 6mg     2/12 6mg  2/13 5mg  2/14 6mg  2/15 5mg  2/16 6mg  2/17 5mg  2/18 6mg     2/19 6mg  2/20 5mg  2/21 6mg  2/22 5mg  2/23 6mg  2/24 5mg  2/25 6mg     2/26 6mg  2/27 5mg  2/28 6mg  March 2017 3/1 5mg  3/2 6mg  3/3 5mg  3/4 6mg     3/5 6mg  3/6 5mg  3/7 6mg  3/8 5mg  3/9 6mg  3/10 5mg  3/11 6mg      INR was 1.9 today

## 2016-01-21 NOTE — Telephone Encounter (Signed)
Sent rx to pharmacy. Patients wife. She states that she is concerned about some recent stomach pain and tenderness that he has been having. He was seen for a protime this AM but did not mention feeling bad per note. Wife states that this pain has been going on for a couple days and he is very weak and doesn't want to eat much. Advised wife that patient needed to be seen and to have him come on down to the office or give [place] a call for an appt later today. Wife verbalized understanding

## 2016-01-21 NOTE — Telephone Encounter (Signed)
This is okay to refill 

## 2016-01-22 ENCOUNTER — Ambulatory Visit (INDEPENDENT_AMBULATORY_CARE_PROVIDER_SITE_OTHER): Payer: Medicare Other | Admitting: Family

## 2016-01-22 VITALS — BP 180/71 | HR 59 | Temp 97.3°F | Ht 65.0 in | Wt 157.0 lb

## 2016-01-22 DIAGNOSIS — I10 Essential (primary) hypertension: Secondary | ICD-10-CM | POA: Diagnosis not present

## 2016-01-22 DIAGNOSIS — K219 Gastro-esophageal reflux disease without esophagitis: Secondary | ICD-10-CM | POA: Diagnosis not present

## 2016-01-22 MED ORDER — OMEPRAZOLE 20 MG PO CPDR: 20.0000 mg | DELAYED_RELEASE_CAPSULE | Freq: Every day | ORAL | Status: DC

## 2016-01-22 NOTE — Progress Notes (Signed)
   Subjective:    Patient ID: Alan Netters., male    DOB: 09-30-1941, 75 y.o.   MRN: [id number]  Abdominal Pain This is a new problem. The current episode started in the past 7 days (two days). The problem occurs intermittently. The problem has been resolved. The pain is located in the generalized abdominal region. The pain is at a severity of 8/10. The pain is moderate. The quality of the pain is aching. The abdominal pain does not radiate. Associated symptoms include belching and flatus. Pertinent negatives include no constipation, diarrhea, dysuria, frequency, headaches, hematochezia, hematuria, nausea or vomiting. Nothing aggravates the pain. The pain is relieved by nothing. He has tried antacids for the symptoms. The treatment provided moderate relief.      Review of Systems  Constitutional: Negative.   HENT: Negative.   Respiratory: Negative.   Cardiovascular: Negative.   Gastrointestinal: Positive for abdominal pain and flatus. Negative for nausea, vomiting, diarrhea, constipation and hematochezia.  Endocrine: Negative.   Genitourinary: Negative.  Negative for dysuria, frequency and hematuria.  Musculoskeletal: Negative.   Neurological: Negative.  Negative for headaches.  Hematological: Negative.   Psychiatric/Behavioral: Negative.   All other systems reviewed and are negative.      Objective:   Physical Exam  Constitutional: He is oriented to person, place, and time. He appears well-developed and well-nourished. No distress.  HENT:  Head: Normocephalic.  Eyes: Pupils are equal, round, and reactive to light. Right eye exhibits no discharge. Left eye exhibits no discharge.  Neck: Normal range of motion. Neck supple. No thyromegaly present.  Cardiovascular: Normal rate, regular rhythm, normal heart sounds and intact distal pulses.   No murmur heard. Pulmonary/Chest: Effort normal and breath sounds normal. No respiratory distress. He has no wheezes.  Abdominal: Soft. Bowel  sounds are normal. He exhibits no distension. There is no tenderness.  Musculoskeletal: Normal range of motion. He exhibits no edema or tenderness.  Neurological: He is alert and oriented to person, place, and time. He has normal reflexes. No cranial nerve deficit.  Skin: Skin is warm and dry. No rash noted. No erythema.  Psychiatric: He has a normal mood and affect. His behavior is normal. Judgment and thought content normal.  Vitals reviewed.   BP 180/71 mmHg  Pulse 59  Temp(Src) 97.3 F (36.3 C) (Oral)  Ht 5\' 5"  (1.651 m)  Wt 157 lb (71.215 kg)  BMI 26.13 kg/m2       Assessment & Plan:  1. Essential hypertension -PT states he has not taken his BP medications this AM- Pt to take when he gets home -Keep follow up appts with PCP  2. Gastroesophageal reflux disease, esophagitis presence not specified -Diet discussed-Avoid spicy, fried foods -Avoid eating 3 hours before bedtime -RTO prn - omeprazole (PRILOSEC) 20 MG capsule; Take 1 capsule (20 mg total) by mouth daily.  Dispense: 30 capsule; Refill: [place], FNP

## 2016-01-22 NOTE — Patient Instructions (Signed)
Gastroesophageal Reflux Disease, Adult Normally, food travels down the esophagus and stays in the stomach to be digested. However, when a person has gastroesophageal reflux disease (GERD), food and stomach acid move back up into the esophagus. When this happens, the esophagus becomes sore and inflamed. Over time, GERD can create small holes (ulcers) in the lining of the esophagus.  CAUSES This condition is caused by a problem with the muscle between the esophagus and the stomach (lower esophageal sphincter, or LES). Normally, the LES muscle closes after food passes through the esophagus to the stomach. When the LES is weakened or abnormal, it does not close properly, and that allows food and stomach acid to go back up into the esophagus. The LES can be weakened by certain dietary substances, medicines, and medical conditions, including:  Tobacco use.  Pregnancy.  Having a hiatal hernia.  Heavy alcohol use.  Certain foods and beverages, such as coffee, chocolate, onions, and peppermint. RISK FACTORS This condition is more likely to develop in:  People who have an increased body weight.  People who have connective tissue disorders.  People who use NSAID medicines. SYMPTOMS Symptoms of this condition include:  Heartburn.  Difficult or painful swallowing.  The feeling of having a lump in the throat.  Abitter taste in the mouth.  Bad breath.  Having a large amount of saliva.  Having an upset or bloated stomach.  Belching.  Chest pain.  Shortness of breath or wheezing.  Ongoing (chronic) cough or a night-time cough.  Wearing away of tooth enamel.  Weight loss. Different conditions can cause chest pain. Make sure to see your health care provider if you experience chest pain. DIAGNOSIS Your health care provider will take a medical history and perform a physical exam. To determine if you have mild or severe GERD, your health care provider may also monitor how you respond  to treatment. You may also have other tests, including:  An endoscopy toexamine your stomach and esophagus with a small camera.  A test thatmeasures the acidity level in your esophagus.  A test thatmeasures how much pressure is on your esophagus.  A barium swallow or modified barium swallow to show the shape, size, and functioning of your esophagus. TREATMENT The goal of treatment is to help relieve your symptoms and to prevent complications. Treatment for this condition may vary depending on how severe your symptoms are. Your health care provider may recommend:  Changes to your diet.  Medicine.  Surgery. HOME CARE INSTRUCTIONS Diet  Follow a diet as recommended by your health care provider. This may involve avoiding foods and drinks such as:  Coffee and tea (with or without caffeine).  Drinks that containalcohol.  Energy drinks and sports drinks.  Carbonated drinks or sodas.  Chocolate and cocoa.  Peppermint and mint flavorings.  Garlic and onions.  Horseradish.  Spicy and acidic foods, including peppers, chili powder, curry powder, vinegar, hot sauces, and barbecue sauce.  Citrus fruit juices and citrus fruits, such as oranges, lemons, and limes.  Tomato-based foods, such as red sauce, chili, salsa, and pizza with red sauce.  Fried and fatty foods, such as donuts, french fries, potato chips, and high-fat dressings.  High-fat meats, such as hot dogs and fatty cuts of red and white meats, such as rib eye steak, sausage, ham, and bacon.  High-fat dairy items, such as whole milk, butter, and cream cheese.  Eat small, frequent meals instead of large meals.  Avoid drinking large amounts of liquid with your   meals.  Avoid eating meals during the 2-3 hours before bedtime.  Avoid lying down right after you eat.  Do not exercise right after you eat. General Instructions  Pay attention to any changes in your symptoms.  Take over-the-counter and prescription  medicines only as told by your health care provider. Do not take aspirin, ibuprofen, or other NSAIDs unless your health care provider told you to do so.  Do not use any tobacco products, including cigarettes, chewing tobacco, and e-cigarettes. If you need help quitting, ask your health care provider.  Wear loose-fitting clothing. Do not wear anything tight around your waist that causes pressure on your abdomen.  Raise (elevate) the head of your bed 6 inches (15cm).  Try to reduce your stress, such as with yoga or meditation. If you need help reducing stress, ask your health care provider.  If you are overweight, reduce your weight to an amount that is healthy for you. Ask your health care provider for guidance about a safe weight loss goal.  Keep all follow-up visits as told by your health care provider. This is important. SEEK MEDICAL CARE IF:  You have new symptoms.  You have unexplained weight loss.  You have difficulty swallowing, or it hurts to swallow.  You have wheezing or a persistent cough.  Your symptoms do not improve with treatment.  You have a hoarse voice. SEEK IMMEDIATE MEDICAL CARE IF:  You have pain in your arms, neck, jaw, teeth, or back.  You feel sweaty, dizzy, or light-headed.  You have chest pain or shortness of breath.  You vomit and your vomit looks like blood or coffee grounds.  You faint.  Your stool is bloody or black.  You cannot swallow, drink, or eat.   This information is not intended to replace advice given to you by your health care provider. Make sure you discuss any questions you have with your health care provider.   Document Released: 09/06/2005 Document Revised: 08/18/2015 Document Reviewed: 03/24/2015 Elsevier Interactive Patient Education 2016 [employer] Food Choices for Gastroesophageal Reflux Disease, Adult When you have gastroesophageal reflux disease (GERD), the foods you eat and your eating habits are very important.  Choosing the right foods can help ease the discomfort of GERD. WHAT GENERAL GUIDELINES DO I NEED TO FOLLOW?  Choose fruits, vegetables, whole grains, low-fat dairy products, and low-fat meat, fish, and poultry.  Limit fats such as oils, salad dressings, butter, nuts, and avocado.  Keep a food diary to identify foods that cause symptoms.  Avoid foods that cause reflux. These may be different for different people.  Eat frequent small meals instead of three large meals each day.  Eat your meals slowly, in a relaxed setting.  Limit fried foods.  Cook foods using methods other than frying.  Avoid drinking alcohol.  Avoid drinking large amounts of liquids with your meals.  Avoid bending over or lying down until 2-3 hours after eating. WHAT FOODS ARE NOT RECOMMENDED? The following are some foods and drinks that may worsen your symptoms: Vegetables Tomatoes. Tomato juice. Tomato and spaghetti sauce. Chili peppers. Onion and garlic. Horseradish. Fruits Oranges, grapefruit, and lemon (fruit and juice). Meats High-fat meats, fish, and poultry. This includes hot dogs, ribs, ham, sausage, salami, and bacon. Dairy Whole milk and chocolate milk. Sour cream. Cream. Butter. Ice cream. Cream cheese.  Beverages Coffee and tea, with or without caffeine. Carbonated beverages or energy drinks. Condiments Hot sauce. Barbecue sauce.  Sweets/Desserts Chocolate and cocoa. Donuts. Peppermint and spearmint.   Fats and Oils High-fat foods, including French fries and potato chips. Other Vinegar. Strong spices, such as black pepper, white pepper, red pepper, cayenne, curry powder, cloves, ginger, and chili powder. The items listed above may not be a complete list of foods and beverages to avoid. Contact your dietitian for more information.   This information is not intended to replace advice given to you by your health care provider. Make sure you discuss any questions you have with your health care  provider.   Document Released: 11/27/2005 Document Revised: 12/18/2014 Document Reviewed: 10/01/2013 Elsevier Interactive Patient Education 2016 Elsevier Inc.  

## 2016-01-23 ENCOUNTER — Other Ambulatory Visit: Payer: Self-pay | Admitting: Family Medicine

## 2016-01-24 ENCOUNTER — Encounter: Payer: Self-pay | Admitting: Family Medicine

## 2016-01-24 ENCOUNTER — Ambulatory Visit (INDEPENDENT_AMBULATORY_CARE_PROVIDER_SITE_OTHER): Payer: Medicare Other | Admitting: Family Medicine

## 2016-01-24 VITALS — BP 133/65 | HR 70 | Temp 97.2°F | Ht 65.0 in | Wt 158.6 lb

## 2016-01-24 DIAGNOSIS — K219 Gastro-esophageal reflux disease without esophagitis: Secondary | ICD-10-CM

## 2016-01-24 DIAGNOSIS — K4090 Unilateral inguinal hernia, without obstruction or gangrene, not specified as recurrent: Secondary | ICD-10-CM | POA: Diagnosis not present

## 2016-01-24 NOTE — Assessment & Plan Note (Signed)
Prilosec and symptoms are resolved after 2 days.

## 2016-01-24 NOTE — Progress Notes (Signed)
BP 133/65 mmHg  Pulse 70  Temp(Src) 97.2 F (36.2 C) (Oral)  Ht 5\' 5"  (1.651 m)  Wt 158 lb 9.6 oz (71.94 kg)  BMI 26.39 kg/m2   Subjective:    Patient ID: Alan Netters., male    DOB: 1941-12-02, 75 y.o.   MRN: [id number]  HPI: Alan Hetzel. is a 75 y.o. male presenting on 01/24/2016 for Abdominal pain followup   HPI GERD and abdominal pain follow-up Patient has taken Prilosec for 2 days and abdominal pain is completely resolved. He denies any use or chills or diarrhea or nausea or vomiting or bloody stools. He denies any constipation. He says he is doing very well today.  Bulging groin Patient says he has been having a bulge on the right side of his groin that's been there for about 2-3 weeks. He denies any pain associated with that is just on gotten larger on that right side of his groin. There are no overlying skin changes or warmth.  Relevant past medical, surgical, family and social history reviewed and updated as indicated. Interim medical history since our last visit reviewed. Allergies and medications reviewed and updated.  Review of Systems  Constitutional: Negative for fever and chills.  HENT: Negative for ear discharge and ear pain.   Eyes: Negative for discharge and visual disturbance.  Respiratory: Negative for shortness of breath and wheezing.   Cardiovascular: Negative for chest pain and leg swelling.  Gastrointestinal: Negative for abdominal pain, diarrhea and constipation.  Genitourinary: Negative for penile swelling, scrotal swelling, difficulty urinating, penile pain and testicular pain.  Musculoskeletal: Negative for back pain and gait problem.  Skin: Negative for rash.  Neurological: Negative for dizziness, syncope, light-headedness and headaches.  All other systems reviewed and are negative.   Per HPI unless specifically indicated above     Medication List       This list is accurate as of: 01/24/16 10:51 AM.  Always use your most recent med list.                amLODipine 10 MG tablet  Commonly known as:  NORVASC  Take 1 tablet (10 mg total) by mouth every evening.     benazepril 40 MG tablet  Commonly known as:  LOTENSIN  TAKE ONE TABLET BY MOUTH ONCE DAILY     febuxostat 40 MG tablet  Commonly known as:  ULORIC  Take 2 tablets (80 mg total) by mouth daily.     finasteride 5 MG tablet  Commonly known as:  PROSCAR  Take 5 mg by mouth daily.     fluticasone 50 MCG/ACT nasal spray  Commonly known as:  FLONASE  Place 1 spray into both nostrils 2 (two) times daily as needed for allergies or rhinitis.     furosemide 40 MG tablet  Commonly known as:  LASIX  Take 1 tablet (40 mg total) by mouth daily as needed for fluid.     glucose blood test strip  Commonly known as:  ONE TOUCH TEST STRIPS  Test QD. DX E11.9     hydrALAZINE 50 MG tablet  Commonly known as:  APRESOLINE  Take 1 tablet (50 mg total) by mouth 3 (three) times daily.     levocetirizine 5 MG tablet  Commonly known as:  XYZAL  Take 1 tablet (5 mg total) by mouth every evening.     metFORMIN 500 MG tablet  Commonly known as:  GLUCOPHAGE  Take 1 tablet (500 mg total) by mouth  2 (two) times daily with a meal.     metoprolol succinate 100 MG 24 hr tablet  Commonly known as:  TOPROL-XL  Take 1 tablet (100 mg total) by mouth 1 day or 1 dose.     niacin 1000 MG CR tablet  Commonly known as:  NIASPAN  Take 1 tablet (1,000 mg total) by mouth at bedtime.     omeprazole 20 MG capsule  Commonly known as:  PRILOSEC  Take 1 capsule (20 mg total) by mouth daily.     onetouch ultrasoft lancets  Test blood sugar qd. DX E11.9     pravastatin 40 MG tablet  Commonly known as:  PRAVACHOL  Take 40 mg by mouth daily.     warfarin 5 MG tablet  Commonly known as:  COUMADIN  Take 1 tablet (5 mg total) by mouth daily at 6 PM.     warfarin 1 MG tablet  Commonly known as:  COUMADIN  Take 1 tablet (1 mg total) by mouth daily.           Objective:    BP  133/65 mmHg  Pulse 70  Temp(Src) 97.2 F (36.2 C) (Oral)  Ht 5\' 5"  (1.651 m)  Wt 158 lb 9.6 oz (71.94 kg)  BMI 26.39 kg/m2  Wt Readings from Last 3 Encounters:  01/24/16 158 lb 9.6 oz (71.94 kg)  01/22/16 157 lb (71.215 kg)  01/07/16 158 lb 3.2 oz (71.759 kg)    Physical Exam  Constitutional: He is oriented to person, place, and time. He appears well-developed and well-nourished. No distress.  Eyes: Conjunctivae and EOM are normal. Pupils are equal, round, and reactive to light. Right eye exhibits no discharge. No scleral icterus.  Neck: Neck supple. No thyromegaly present.  Cardiovascular: Normal rate, regular rhythm, normal heart sounds and intact distal pulses.   No murmur heard. Pulmonary/Chest: Effort normal and breath sounds normal. No respiratory distress. He has no wheezes.  Abdominal: Soft. Bowel sounds are normal. He exhibits no distension. There is no tenderness. There is no rebound and no guarding. A hernia is present. Hernia confirmed positive in the right inguinal area.  Musculoskeletal: Normal range of motion. He exhibits no edema.  Lymphadenopathy:    He has no cervical adenopathy.  Neurological: He is alert and oriented to person, place, and time. Coordination normal.  Skin: Skin is warm and dry. No rash noted. He is not diaphoretic.  Psychiatric: He has a normal mood and affect. His behavior is normal.  Nursing note and vitals reviewed.   Results for orders placed or performed in visit on 01/21/16  POCT INR  Result Value Ref Range   INR 1.9       Assessment & Plan:   Problem List Items Addressed This Visit      Digestive   GERD (gastroesophageal reflux disease)    Prilosec and symptoms are resolved after 2 days.       Other Visit Diagnoses    Right inguinal hernia    -  Primary    [employer], has a surgeon already that just repaired another hernia in December, told him to call them up        Follow up plan: Return if symptoms worsen or fail to  improve.  Counseling provided for all of the vaccine components No orders of the defined types were placed in this encounter.    Caryl Pina, MD [facility] 01/24/2016, 10:51 AM

## 2016-02-02 DIAGNOSIS — H2513 Age-related nuclear cataract, bilateral: Secondary | ICD-10-CM | POA: Diagnosis not present

## 2016-02-02 DIAGNOSIS — E119 Type 2 diabetes mellitus without complications: Secondary | ICD-10-CM | POA: Diagnosis not present

## 2016-02-02 DIAGNOSIS — H43813 Vitreous degeneration, bilateral: Secondary | ICD-10-CM | POA: Diagnosis not present

## 2016-02-02 DIAGNOSIS — Z7984 Long term (current) use of oral hypoglycemic drugs: Secondary | ICD-10-CM | POA: Diagnosis not present

## 2016-02-02 LAB — HM DIABETES EYE EXAM

## 2016-02-07 ENCOUNTER — Encounter: Payer: Self-pay | Admitting: Pharmacist

## 2016-02-11 ENCOUNTER — Ambulatory Visit (INDEPENDENT_AMBULATORY_CARE_PROVIDER_SITE_OTHER): Payer: Medicare Other | Admitting: Urology

## 2016-02-11 DIAGNOSIS — N401 Enlarged prostate with lower urinary tract symptoms: Secondary | ICD-10-CM | POA: Diagnosis not present

## 2016-02-11 DIAGNOSIS — R972 Elevated prostate specific antigen [PSA]: Secondary | ICD-10-CM

## 2016-02-11 DIAGNOSIS — C61 Malignant neoplasm of prostate: Secondary | ICD-10-CM | POA: Diagnosis not present

## 2016-02-11 DIAGNOSIS — K4090 Unilateral inguinal hernia, without obstruction or gangrene, not specified as recurrent: Secondary | ICD-10-CM | POA: Diagnosis not present

## 2016-02-18 ENCOUNTER — Ambulatory Visit (INDEPENDENT_AMBULATORY_CARE_PROVIDER_SITE_OTHER): Payer: Medicare Other | Admitting: Pharmacist

## 2016-02-18 DIAGNOSIS — I748 Embolism and thrombosis of other arteries: Secondary | ICD-10-CM

## 2016-02-18 LAB — COAGUCHEK XS/INR WAIVED
INR: 1.7 — ABNORMAL HIGH (ref 0.9–1.1)
Prothrombin Time: 20.8 s

## 2016-02-18 NOTE — Patient Instructions (Signed)
Anticoagulation Dose Instructions as of 02/18/2016      Alan [name] Tue Wed Thu Fri Sat         3/10 6mg  3/11 6mg     3/12 6mg  3/13 6mg  3/14 6mg  3/15 5mg  3/16 6mg  3/17 6mg  3/18 6mg     3/19 6mg  3/20 6mg  3/21 6mg  3/22 5mg  3/23 6mg  3/24 6mg  3/25 6mg     3/26 6mg  3/27 6mg  3/28 6mg  3/29 5mg  3/30 6mg  3/31 6mg  4/1 6mg     4/2 6mg  4/3 6mg  4/4 6mg  4/5 5mg  4/6 6mg       Description        Increase dose to 6mg  every day except on Wednesday

## 2016-03-07 DIAGNOSIS — M542 Cervicalgia: Secondary | ICD-10-CM | POA: Diagnosis not present

## 2016-03-07 DIAGNOSIS — Z7901 Long term (current) use of anticoagulants: Secondary | ICD-10-CM | POA: Diagnosis not present

## 2016-03-07 DIAGNOSIS — I10 Essential (primary) hypertension: Secondary | ICD-10-CM | POA: Diagnosis not present

## 2016-03-07 DIAGNOSIS — S0990XA Unspecified injury of head, initial encounter: Secondary | ICD-10-CM | POA: Diagnosis not present

## 2016-03-07 DIAGNOSIS — K861 Other chronic pancreatitis: Secondary | ICD-10-CM | POA: Diagnosis not present

## 2016-03-07 DIAGNOSIS — Z888 Allergy status to other drugs, medicaments and biological substances status: Secondary | ICD-10-CM | POA: Diagnosis not present

## 2016-03-07 DIAGNOSIS — E119 Type 2 diabetes mellitus without complications: Secondary | ICD-10-CM | POA: Diagnosis not present

## 2016-03-07 DIAGNOSIS — J90 Pleural effusion, not elsewhere classified: Secondary | ICD-10-CM | POA: Diagnosis not present

## 2016-03-07 DIAGNOSIS — Z86718 Personal history of other venous thrombosis and embolism: Secondary | ICD-10-CM | POA: Diagnosis not present

## 2016-03-07 DIAGNOSIS — K5730 Diverticulosis of large intestine without perforation or abscess without bleeding: Secondary | ICD-10-CM | POA: Diagnosis not present

## 2016-03-07 DIAGNOSIS — M47812 Spondylosis without myelopathy or radiculopathy, cervical region: Secondary | ICD-10-CM | POA: Diagnosis not present

## 2016-03-08 DIAGNOSIS — K5730 Diverticulosis of large intestine without perforation or abscess without bleeding: Secondary | ICD-10-CM | POA: Diagnosis not present

## 2016-03-08 DIAGNOSIS — J90 Pleural effusion, not elsewhere classified: Secondary | ICD-10-CM | POA: Diagnosis not present

## 2016-03-08 DIAGNOSIS — S0990XA Unspecified injury of head, initial encounter: Secondary | ICD-10-CM | POA: Diagnosis not present

## 2016-03-08 DIAGNOSIS — M47812 Spondylosis without myelopathy or radiculopathy, cervical region: Secondary | ICD-10-CM | POA: Diagnosis not present

## 2016-03-09 ENCOUNTER — Ambulatory Visit: Payer: Self-pay | Admitting: Family Medicine

## 2016-03-09 DIAGNOSIS — K4090 Unilateral inguinal hernia, without obstruction or gangrene, not specified as recurrent: Secondary | ICD-10-CM | POA: Diagnosis not present

## 2016-03-10 ENCOUNTER — Ambulatory Visit: Payer: Medicare Other

## 2016-03-10 ENCOUNTER — Ambulatory Visit (INDEPENDENT_AMBULATORY_CARE_PROVIDER_SITE_OTHER): Payer: Medicare Other | Admitting: Family Medicine

## 2016-03-10 ENCOUNTER — Encounter: Payer: Self-pay | Admitting: Family Medicine

## 2016-03-10 VITALS — BP 137/75 | HR 64 | Temp 97.9°F | Ht 65.0 in | Wt 157.0 lb

## 2016-03-10 DIAGNOSIS — J90 Pleural effusion, not elsewhere classified: Secondary | ICD-10-CM
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
## 2016-03-14 ENCOUNTER — Other Ambulatory Visit: Payer: Self-pay | Admitting: Family Medicine

## 2016-03-16 ENCOUNTER — Ambulatory Visit (INDEPENDENT_AMBULATORY_CARE_PROVIDER_SITE_OTHER): Payer: Medicare Other

## 2016-03-16 ENCOUNTER — Ambulatory Visit (INDEPENDENT_AMBULATORY_CARE_PROVIDER_SITE_OTHER): Payer: Medicare Other | Admitting: Pharmacist

## 2016-03-16 ENCOUNTER — Encounter: Payer: Self-pay | Admitting: Gastroenterology

## 2016-03-16 DIAGNOSIS — J90 Pleural effusion, not elsewhere classified: Secondary | ICD-10-CM

## 2016-03-16 DIAGNOSIS — I748 Embolism and thrombosis of other arteries: Secondary | ICD-10-CM

## 2016-03-16 LAB — COAGUCHEK XS/INR WAIVED
INR: 1.7 — ABNORMAL HIGH (ref 0.9–1.1)
PROTHROMBIN TIME: 19.9 s

## 2016-03-16 NOTE — Patient Instructions (Signed)
Anticoagulation Dose Instructions as of 03/16/2016      Alan [name] Tue Wed Thu Fri Sat   New Dose 6 mg 6 mg 6 mg 6 mg 6 mg 6 mg 6 mg    Description        Take 7mg  today (5mg  + 2 tablets of 1mg ) then increase dose to 6mg  every day (1 tablet each of both 5mg  and 1mg )     INR was 1.7 today

## 2016-04-04 ENCOUNTER — Telehealth: Payer: Self-pay | Admitting: Family Medicine

## 2016-04-04 NOTE — Telephone Encounter (Signed)
Appt scheduled with Dr. Warrick Parisian for surgical clearance next week.

## 2016-04-09 ENCOUNTER — Encounter (HOSPITAL_COMMUNITY): Payer: Self-pay | Admitting: Emergency Medicine

## 2016-04-09 ENCOUNTER — Inpatient Hospital Stay (HOSPITAL_COMMUNITY): Payer: Medicare Other

## 2016-04-09 ENCOUNTER — Emergency Department (HOSPITAL_COMMUNITY): Payer: Medicare Other

## 2016-04-09 ENCOUNTER — Inpatient Hospital Stay (HOSPITAL_COMMUNITY)
Admission: EM | Admit: 2016-04-09 | Discharge: 2016-04-20 | DRG: 187 | Disposition: A | Discharge status: Home / Self Care | Payer: Medicare Other | Attending: Family Medicine | Admitting: Family Medicine

## 2016-04-09 DIAGNOSIS — N400 Enlarged prostate without lower urinary tract symptoms: Secondary | ICD-10-CM | POA: Diagnosis present

## 2016-04-09 DIAGNOSIS — J90 Pleural effusion, not elsewhere classified: Secondary | ICD-10-CM | POA: Diagnosis not present

## 2016-04-09 DIAGNOSIS — Z808 Family history of malignant neoplasm of other organs or systems: Secondary | ICD-10-CM | POA: Diagnosis not present

## 2016-04-09 DIAGNOSIS — K859 Acute pancreatitis without necrosis or infection, unspecified: Secondary | ICD-10-CM | POA: Diagnosis not present

## 2016-04-09 DIAGNOSIS — Z8546 Personal history of malignant neoplasm of prostate: Secondary | ICD-10-CM

## 2016-04-09 DIAGNOSIS — Z7901 Long term (current) use of anticoagulants: Secondary | ICD-10-CM | POA: Diagnosis not present

## 2016-04-09 DIAGNOSIS — Z87891 Personal history of nicotine dependence: Secondary | ICD-10-CM | POA: Diagnosis not present

## 2016-04-09 DIAGNOSIS — J948 Other specified pleural conditions: Secondary | ICD-10-CM | POA: Diagnosis not present

## 2016-04-09 DIAGNOSIS — J918 Pleural effusion in other conditions classified elsewhere: Secondary | ICD-10-CM | POA: Diagnosis not present

## 2016-04-09 DIAGNOSIS — I480 Paroxysmal atrial fibrillation: Secondary | ICD-10-CM

## 2016-04-09 DIAGNOSIS — Z823 Family history of stroke: Secondary | ICD-10-CM | POA: Diagnosis not present

## 2016-04-09 DIAGNOSIS — R748 Abnormal levels of other serum enzymes: Secondary | ICD-10-CM

## 2016-04-09 DIAGNOSIS — K5900 Constipation, unspecified: Secondary | ICD-10-CM | POA: Diagnosis not present

## 2016-04-09 DIAGNOSIS — I10 Essential (primary) hypertension: Secondary | ICD-10-CM | POA: Diagnosis present

## 2016-04-09 DIAGNOSIS — I4891 Unspecified atrial fibrillation: Secondary | ICD-10-CM | POA: Diagnosis not present

## 2016-04-09 DIAGNOSIS — I4892 Unspecified atrial flutter: Secondary | ICD-10-CM | POA: Diagnosis not present

## 2016-04-09 DIAGNOSIS — D649 Anemia, unspecified: Secondary | ICD-10-CM | POA: Diagnosis present

## 2016-04-09 DIAGNOSIS — Z7984 Long term (current) use of oral hypoglycemic drugs: Secondary | ICD-10-CM | POA: Diagnosis not present

## 2016-04-09 DIAGNOSIS — R0602 Shortness of breath: Secondary | ICD-10-CM | POA: Diagnosis not present

## 2016-04-09 DIAGNOSIS — I748 Embolism and thrombosis of other arteries: Secondary | ICD-10-CM

## 2016-04-09 DIAGNOSIS — Z820 Family history of epilepsy and other diseases of the nervous system: Secondary | ICD-10-CM

## 2016-04-09 DIAGNOSIS — Z8249 Family history of ischemic heart disease and other diseases of the circulatory system: Secondary | ICD-10-CM

## 2016-04-09 DIAGNOSIS — E1159 Type 2 diabetes mellitus with other circulatory complications: Secondary | ICD-10-CM | POA: Diagnosis present

## 2016-04-09 DIAGNOSIS — J9811 Atelectasis: Secondary | ICD-10-CM | POA: Diagnosis not present

## 2016-04-09 DIAGNOSIS — E785 Hyperlipidemia, unspecified: Secondary | ICD-10-CM | POA: Diagnosis not present

## 2016-04-09 DIAGNOSIS — R918 Other nonspecific abnormal finding of lung field: Secondary | ICD-10-CM | POA: Diagnosis not present

## 2016-04-09 DIAGNOSIS — R932 Abnormal findings on diagnostic imaging of liver and biliary tract: Secondary | ICD-10-CM | POA: Diagnosis not present

## 2016-04-09 DIAGNOSIS — E860 Dehydration: Secondary | ICD-10-CM | POA: Diagnosis not present

## 2016-04-09 DIAGNOSIS — E119 Type 2 diabetes mellitus without complications: Secondary | ICD-10-CM

## 2016-04-09 DIAGNOSIS — D735 Infarction of spleen: Secondary | ICD-10-CM | POA: Diagnosis present

## 2016-04-09 DIAGNOSIS — Z9049 Acquired absence of other specified parts of digestive tract: Secondary | ICD-10-CM | POA: Diagnosis not present

## 2016-04-09 DIAGNOSIS — Z9889 Other specified postprocedural states: Secondary | ICD-10-CM

## 2016-04-09 HISTORY — DX: Acute pancreatitis without necrosis or infection, unspecified: K85.90

## 2016-04-09 HISTORY — DX: Unspecified atrial fibrillation: I48.91

## 2016-04-09 HISTORY — DX: Embolism and thrombosis of other arteries: I74.8

## 2016-04-09 LAB — COMPREHENSIVE METABOLIC PANEL
ALK PHOS: 81 U/L (ref 38–126)
ALT: 17 U/L (ref 17–63)
AST: 21 U/L (ref 15–41)
Albumin: 2.9 g/dL — ABNORMAL LOW (ref 3.5–5.0)
Anion gap: 11 (ref 5–15)
BUN: 23 mg/dL — ABNORMAL HIGH (ref 6–20)
CALCIUM: 8.8 mg/dL — AB (ref 8.9–10.3)
CO2: 27 mmol/L (ref 22–32)
CREATININE: 1.08 mg/dL (ref 0.61–1.24)
Chloride: 104 mmol/L (ref 101–111)
Glucose, Bld: 112 mg/dL — ABNORMAL HIGH (ref 65–99)
Potassium: 3.3 mmol/L — ABNORMAL LOW (ref 3.5–5.1)
Sodium: 142 mmol/L (ref 135–145)
TOTAL PROTEIN: 6.5 g/dL (ref 6.5–8.1)
Total Bilirubin: 0.9 mg/dL (ref 0.3–1.2)

## 2016-04-09 LAB — CBC WITH DIFFERENTIAL/PLATELET
BASOS ABS: 0 10*3/uL (ref 0.0–0.1)
Basophils Relative: 0 %
Eosinophils Absolute: 0 10*3/uL (ref 0.0–0.7)
Eosinophils Relative: 1 %
HEMATOCRIT: 35.8 % — AB (ref 39.0–52.0)
Hemoglobin: 11.8 g/dL — ABNORMAL LOW (ref 13.0–17.0)
LYMPHS PCT: 25 %
Lymphs Abs: 1.9 10*3/uL (ref 0.7–4.0)
MCH: 28.8 pg (ref 26.0–34.0)
MCHC: 33 g/dL (ref 30.0–36.0)
MCV: 87.3 fL (ref 78.0–100.0)
Monocytes Absolute: 0.7 10*3/uL (ref 0.1–1.0)
Monocytes Relative: 9 %
NEUTROS PCT: 65 %
Neutro Abs: 5.2 10*3/uL (ref 1.7–7.7)
PLATELETS: 415 10*3/uL — AB (ref 150–400)
RBC: 4.1 MIL/uL — AB (ref 4.22–5.81)
RDW: 16.3 % — ABNORMAL HIGH (ref 11.5–15.5)
WBC: 7.9 10*3/uL (ref 4.0–10.5)

## 2016-04-09 LAB — PROTEIN, BODY FLUID: TOTAL PROTEIN, FLUID: 4.4 g/dL

## 2016-04-09 LAB — BODY FLUID CELL COUNT WITH DIFFERENTIAL
Eos, Fluid: 0 %
Lymphs, Fluid: 8 %
MONOCYTE-MACROPHAGE-SEROUS FLUID: 90 % (ref 50–90)
Neutrophil Count, Fluid: 2 % (ref 0–25)
Total Nucleated Cell Count, Fluid: 532 cu mm (ref 0–1000)

## 2016-04-09 LAB — GLUCOSE, CAPILLARY
GLUCOSE-CAPILLARY: 80 mg/dL (ref 65–99)
GLUCOSE-CAPILLARY: 93 mg/dL (ref 65–99)

## 2016-04-09 LAB — BRAIN NATRIURETIC PEPTIDE: B NATRIURETIC PEPTIDE 5: 76 pg/mL (ref 0.0–100.0)

## 2016-04-09 LAB — LACTATE DEHYDROGENASE: LDH: 126 U/L (ref 98–192)

## 2016-04-09 LAB — LIPASE, BLOOD: Lipase: 170 U/L — ABNORMAL HIGH (ref 11–51)

## 2016-04-09 LAB — TROPONIN I: TROPONIN I: 0.03 ng/mL (ref <0.031)

## 2016-04-09 LAB — MRSA PCR SCREENING: MRSA by PCR: NEGATIVE

## 2016-04-09 LAB — AMYLASE, PLEURAL FLUID: Amylase, Pleural Fluid: 1447 U/L

## 2016-04-09 LAB — LACTATE DEHYDROGENASE, PLEURAL OR PERITONEAL FLUID: LD FL: 390 U/L — AB (ref 3–23)

## 2016-04-09 LAB — GLUCOSE, SEROUS FLUID: Glucose, Fluid: 91 mg/dL

## 2016-04-09 LAB — PROTIME-INR
INR: 1.63 — ABNORMAL HIGH (ref 0.00–1.49)
Prothrombin Time: 19.4 seconds — ABNORMAL HIGH (ref 11.6–15.2)

## 2016-04-09 MED ORDER — FINASTERIDE 5 MG PO TABS
5.0000 mg | ORAL_TABLET | Freq: Every day | ORAL | Status: DC
  Administered 2016-04-09 – 2016-04-20 (×11): 5 mg via ORAL
  Filled 2016-04-09 (×13): qty 1

## 2016-04-09 MED ORDER — METOPROLOL SUCCINATE ER 50 MG PO TB24
100.0000 mg | ORAL_TABLET | Freq: Every day | ORAL | Status: DC
  Administered 2016-04-09 – 2016-04-20 (×11): 100 mg via ORAL
  Filled 2016-04-09 (×12): qty 2

## 2016-04-09 MED ORDER — ONDANSETRON HCL 4 MG PO TABS
4.0000 mg | ORAL_TABLET | Freq: Four times a day (QID) | ORAL | Status: DC | PRN
  Administered 2016-04-10 – 2016-04-18 (×2): 4 mg via ORAL
  Filled 2016-04-09 (×2): qty 1

## 2016-04-09 MED ORDER — SODIUM CHLORIDE 0.9% FLUSH
3.0000 mL | Freq: Two times a day (BID) | INTRAVENOUS | Status: DC
  Administered 2016-04-09 – 2016-04-20 (×9): 3 mL via INTRAVENOUS

## 2016-04-09 MED ORDER — BENAZEPRIL HCL 10 MG PO TABS
40.0000 mg | ORAL_TABLET | Freq: Every day | ORAL | Status: DC
  Administered 2016-04-09 – 2016-04-18 (×9): 40 mg via ORAL
  Filled 2016-04-09 (×11): qty 4

## 2016-04-09 MED ORDER — WARFARIN - PHARMACIST DOSING INPATIENT
Freq: Every day | Status: DC
  Administered 2016-04-09: 18:00:00

## 2016-04-09 MED ORDER — LIDOCAINE-EPINEPHRINE (PF) 1 %-1:200000 IJ SOLN
INTRAMUSCULAR | Status: AC
  Administered 2016-04-09: 11:00:00
  Filled 2016-04-09: qty 30

## 2016-04-09 MED ORDER — POTASSIUM CHLORIDE CRYS ER 20 MEQ PO TBCR
20.0000 meq | EXTENDED_RELEASE_TABLET | Freq: Two times a day (BID) | ORAL | Status: AC
  Administered 2016-04-09 (×2): 20 meq via ORAL
  Filled 2016-04-09 (×2): qty 1

## 2016-04-09 MED ORDER — FEBUXOSTAT 40 MG PO TABS
80.0000 mg | ORAL_TABLET | Freq: Every day | ORAL | Status: DC
  Administered 2016-04-09 – 2016-04-20 (×11): 80 mg via ORAL
  Filled 2016-04-09 (×14): qty 2

## 2016-04-09 MED ORDER — PRAVASTATIN SODIUM 40 MG PO TABS
40.0000 mg | ORAL_TABLET | Freq: Every day | ORAL | Status: DC
  Administered 2016-04-09 – 2016-04-20 (×11): 40 mg via ORAL
  Filled 2016-04-09 (×11): qty 1
  Filled 2016-04-09: qty 4

## 2016-04-09 MED ORDER — DOCUSATE SODIUM 100 MG PO CAPS
100.0000 mg | ORAL_CAPSULE | Freq: Two times a day (BID) | ORAL | Status: DC
  Administered 2016-04-09 – 2016-04-20 (×22): 100 mg via ORAL
  Filled 2016-04-09 (×23): qty 1

## 2016-04-09 MED ORDER — FUROSEMIDE 10 MG/ML IJ SOLN
20.0000 mg | Freq: Two times a day (BID) | INTRAMUSCULAR | Status: DC
  Administered 2016-04-09 – 2016-04-10 (×2): 20 mg via INTRAVENOUS
  Filled 2016-04-09 (×2): qty 2

## 2016-04-09 MED ORDER — FUROSEMIDE 40 MG PO TABS
40.0000 mg | ORAL_TABLET | Freq: Every day | ORAL | Status: DC
  Administered 2016-04-09: 40 mg via ORAL
  Filled 2016-04-09: qty 1

## 2016-04-09 MED ORDER — SODIUM CHLORIDE 0.9 % IV SOLN: 250.0000 mL | INTRAVENOUS | Status: DC | PRN

## 2016-04-09 MED ORDER — SODIUM CHLORIDE 0.9% FLUSH: 3.0000 mL | INTRAVENOUS | Status: DC | PRN

## 2016-04-09 MED ORDER — ACETAMINOPHEN 325 MG PO TABS: 650.0000 mg | ORAL_TABLET | Freq: Four times a day (QID) | ORAL | Status: DC | PRN

## 2016-04-09 MED ORDER — LORATADINE 10 MG PO TABS
10.0000 mg | ORAL_TABLET | Freq: Every day | ORAL | Status: DC
  Administered 2016-04-09 – 2016-04-19 (×11): 10 mg via ORAL
  Filled 2016-04-09 (×11): qty 1

## 2016-04-09 MED ORDER — POTASSIUM CHLORIDE IN NACL 20-0.9 MEQ/L-% IV SOLN: INTRAVENOUS | Status: DC

## 2016-04-09 MED ORDER — ONDANSETRON HCL 4 MG/2ML IJ SOLN: 4.0000 mg | Freq: Four times a day (QID) | INTRAMUSCULAR | Status: DC | PRN

## 2016-04-09 MED ORDER — HYDROMORPHONE HCL 1 MG/ML IJ SOLN
0.5000 mg | INTRAMUSCULAR | Status: DC | PRN
  Administered 2016-04-15: 0.5 mg via INTRAVENOUS
  Filled 2016-04-09: qty 1

## 2016-04-09 MED ORDER — NIACIN ER (ANTIHYPERLIPIDEMIC) 500 MG PO TBCR
1000.0000 mg | EXTENDED_RELEASE_TABLET | Freq: Every day | ORAL | Status: DC
  Administered 2016-04-09 – 2016-04-19 (×11): 1000 mg via ORAL
  Filled 2016-04-09 (×12): qty 2

## 2016-04-09 MED ORDER — ONDANSETRON HCL 4 MG/2ML IJ SOLN: 4.0000 mg | Freq: Three times a day (TID) | INTRAMUSCULAR | Status: DC | PRN

## 2016-04-09 MED ORDER — POTASSIUM CHLORIDE IN NACL 20-0.9 MEQ/L-% IV SOLN
INTRAVENOUS | Status: DC
  Administered 2016-04-09 – 2016-04-12 (×2): via INTRAVENOUS

## 2016-04-09 MED ORDER — INSULIN ASPART 100 UNIT/ML ~~LOC~~ SOLN: 0.0000 [IU] | Freq: Every day | SUBCUTANEOUS | Status: DC

## 2016-04-09 MED ORDER — LEVOCETIRIZINE DIHYDROCHLORIDE 5 MG PO TABS: 5.0000 mg | ORAL_TABLET | Freq: Every evening | ORAL | Status: DC

## 2016-04-09 MED ORDER — PANTOPRAZOLE SODIUM 40 MG PO TBEC
40.0000 mg | DELAYED_RELEASE_TABLET | Freq: Every day | ORAL | Status: DC
  Administered 2016-04-09 – 2016-04-20 (×11): 40 mg via ORAL
  Filled 2016-04-09 (×12): qty 1

## 2016-04-09 MED ORDER — AMLODIPINE BESYLATE 5 MG PO TABS
10.0000 mg | ORAL_TABLET | Freq: Every evening | ORAL | Status: DC
  Administered 2016-04-09 – 2016-04-18 (×10): 10 mg via ORAL
  Filled 2016-04-09 (×10): qty 2

## 2016-04-09 MED ORDER — IOPAMIDOL (ISOVUE-300) INJECTION 61%
75.0000 mL | Freq: Once | INTRAVENOUS | Status: AC | PRN
  Administered 2016-04-09: 75 mL via INTRAVENOUS

## 2016-04-09 MED ORDER — WARFARIN SODIUM 5 MG PO TABS
6.0000 mg | ORAL_TABLET | Freq: Once | ORAL | Status: AC
  Administered 2016-04-09: 6 mg via ORAL
  Filled 2016-04-09: qty 1

## 2016-04-09 MED ORDER — ACETAMINOPHEN 650 MG RE SUPP: 650.0000 mg | Freq: Four times a day (QID) | RECTAL | Status: DC | PRN

## 2016-04-09 MED ORDER — ALBUTEROL SULFATE (2.5 MG/3ML) 0.083% IN NEBU: 2.5000 mg | INHALATION_SOLUTION | RESPIRATORY_TRACT | Status: DC | PRN

## 2016-04-09 MED ORDER — INSULIN ASPART 100 UNIT/ML ~~LOC~~ SOLN
0.0000 [IU] | Freq: Three times a day (TID) | SUBCUTANEOUS | Status: DC
Start: 2016-04-09 — End: 2016-04-20
  Administered 2016-04-11: 2 [IU] via SUBCUTANEOUS
  Administered 2016-04-12 – 2016-04-14 (×4): 1 [IU] via SUBCUTANEOUS
  Administered 2016-04-15 (×2): 2 [IU] via SUBCUTANEOUS
  Administered 2016-04-16 – 2016-04-20 (×8): 1 [IU] via SUBCUTANEOUS

## 2016-04-09 MED ORDER — OXYCODONE HCL 5 MG PO TABS
5.0000 mg | ORAL_TABLET | ORAL | Status: DC | PRN
  Administered 2016-04-10 – 2016-04-15 (×5): 5 mg via ORAL
  Filled 2016-04-09 (×5): qty 1

## 2016-04-09 MED ORDER — FLUTICASONE PROPIONATE 50 MCG/ACT NA SUSP: 1.0000 | Freq: Two times a day (BID) | NASAL | Status: DC | PRN

## 2016-04-09 MED ORDER — HYDRALAZINE HCL 25 MG PO TABS
50.0000 mg | ORAL_TABLET | Freq: Three times a day (TID) | ORAL | Status: DC
  Administered 2016-04-09 – 2016-04-20 (×32): 50 mg via ORAL
  Filled 2016-04-09 (×33): qty 2

## 2016-04-09 NOTE — ED Notes (Signed)
Assisted Dr Sabra Heck with left thoracentesis. 850 ml amber/cloudy fluid collected. Specimen send to lab. Patient tolerated procedure well. VSS.

## 2016-04-09 NOTE — ED Provider Notes (Signed)
CSN: [id number]     Arrival date & time 04/09/16  0845 History  By signing my name below, I, Doran Stabler, attest that this documentation has been prepared under the direction and in the presence of Noemi Chapel, MD. Electronically Signed: Doran Stabler, ED Scribe. 04/09/2016. 9:33 AM.   Chief Complaint  Patient presents with  . Shortness of Breath   The history is provided by the patient. No language interpreter was used.   HPI Comments: Alan [name]. is a 75 y.o. male who presents to the Emergency Department with a PMHx HTN complaining of SOB that began 3 weeks ago. Pt also reports leg swelling. Pt states when he has an episode of SOB he has trouble inhaling and catching his breath. Pt symptoms are worse at night. Pt reports for the past 2 days his SOB is also worse during the day. Pt is on Coumadin. Pt smoked cigarettes 30 years ago and has not smoked since. Pt denies any fever, chills,  CP, N/V/D or any other symptoms at this time.    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 As per char review, CXR on 03/16/16 shows small pleural effusion on the right sided.  Past Medical History  Diagnosis Date  . Hypertension   . Heart murmur   . ED (erectile dysfunction)   . Agranulocytosis ([place])   . Diabetes mellitus   . Hyperlipidemia   . Gout   . Adenomatous colon polyp   . Prostate cancer ([place])     Followed by Dr. Jeffie Pollock   . Pancreatitis   . Gallstones   . Allergy   . Internal hemorrhoids   . Diverticulosis    Past Surgical History  Procedure Laterality Date  . Colonoscopy  2012    [name]: moderate sigmoid diverticulosis, internal hemorrhoids. next tcs 2017  . Sigmoidoscopy    . Cholecystectomy N/A 11/26/2015    Procedure: LAPAROSCOPIC CHOLECYSTECTOMY;  Surgeon: Aviva Signs, MD;  Location: [facility];  Service: General;  Laterality: N/A;  . Umbilical hernia repair N/A 11/26/2015    Procedure: UMBILICAL HERNIORRHAPHY;   Surgeon: Aviva Signs, MD;  Location: [facility];  Service: General;  Laterality: N/A;   Family History  Problem Relation Age of Onset  . Stroke Mother   . Hypertension Mother   . Alzheimer's disease Mother   . Hypertension Father   . Cancer Sister     brain  . Cancer Sister     breast  . Colon cancer Neg Hx   . Pancreatic disease Neg Hx   . Deep vein thrombosis Brother   . Hypertension Brother    Social History  Substance Use Topics  . Smoking status: Former Smoker    Types: Cigarettes    Quit date: 12/11/1986  . Smokeless tobacco: Never Used  . Alcohol Use: No    Review of Systems  Constitutional: Negative for fever and chills.  Respiratory: Positive for shortness of breath.   Cardiovascular: Positive for leg swelling. Negative for chest pain.  Gastrointestinal: Negative for nausea, vomiting and diarrhea.  All other systems reviewed and are negative.  Allergies  Allopurinol and Colchicine  Home Medications   Prior to Admission medications   Medication Sig Start Date End Date Taking? Authorizing Provider  amLODipine (NORVASC) 10 MG tablet Take 1 tablet (10 mg total) by mouth every evening. 09/08/15  Yes Wardell Honour, MD  benazepril (LOTENSIN) 40 MG tablet TAKE ONE  TABLET BY MOUTH ONCE DAILY 09/08/15  Yes Wardell Honour, MD  febuxostat (ULORIC) 40 MG tablet Take 2 tablets (80 mg total) by mouth daily. 09/08/15  Yes Wardell Honour, MD  finasteride (PROSCAR) 5 MG tablet Take 5 mg by mouth daily. 10/25/15  Yes Historical Provider, MD  fluticasone (FLONASE) 50 MCG/ACT nasal spray Place 1 spray into both nostrils 2 (two) times daily as needed for allergies or rhinitis. 11/05/15  Yes Fransisca Kaufmann Dettinger, MD  furosemide (LASIX) 40 MG tablet Take 1 tablet (40 mg total) by mouth daily as needed for fluid. 10/19/15  Yes Kathie Dike, MD  hydrALAZINE (APRESOLINE) 50 MG tablet Take 1 tablet (50 mg total) by mouth 3 (three) times daily. 01/21/16  Yes Chipper Herb, MD   levocetirizine (XYZAL) 5 MG tablet Take 1 tablet (5 mg total) by mouth every evening. 11/05/15  Yes Fransisca Kaufmann Dettinger, MD  metFORMIN (GLUCOPHAGE) 500 MG tablet TAKE ONE TABLET BY MOUTH TWICE DAILY WITH A MEAL 01/24/16  Yes Fransisca Kaufmann Dettinger, MD  metoprolol succinate (TOPROL-XL) 100 MG 24 hr tablet Take 1 tablet (100 mg total) by mouth 1 day or 1 dose. Patient taking differently: Take 100 mg by mouth daily.  09/08/15  Yes Wardell Honour, MD  niacin (NIASPAN) 1000 MG CR tablet Take 1 tablet (1,000 mg total) by mouth at bedtime. 09/08/15  Yes Wardell Honour, MD  omeprazole (PRILOSEC) 20 MG capsule Take 1 capsule (20 mg total) by mouth daily. 01/22/16  Yes Sharion Balloon, FNP  pravastatin (PRAVACHOL) 40 MG tablet Take 40 mg by mouth daily.   Yes Historical Provider, MD  warfarin (COUMADIN) 1 MG tablet Take 1 tablet (1 mg total) by mouth daily. 01/07/16  Yes Fransisca Kaufmann Dettinger, MD  warfarin (COUMADIN) 5 MG tablet TAKE ONE TABLET BY MOUTH ONCE DAILY AT  6  PM 03/15/16  Yes Fransisca Kaufmann Dettinger, MD   BP 151/77 mmHg  Pulse 81  Temp(Src) 98.3 F (36.8 C) (Temporal)  Resp 21  Ht 5\' 5"  (1.651 m)  Wt 168 lb (76.204 kg)  BMI 27.96 kg/m2  SpO2 95%   Physical Exam  Constitutional: He appears well-developed and well-nourished. No distress.  HENT:  Head: Normocephalic and atraumatic.  Mouth/Throat: Oropharynx is clear and moist. No oropharyngeal exudate.  Eyes: Conjunctivae and EOM are normal. Pupils are equal, round, and reactive to light. Right eye exhibits no discharge. Left eye exhibits no discharge. No scleral icterus.  Neck: Normal range of motion. Neck supple. No JVD present. No thyromegaly present.  Cardiovascular: Normal rate, regular rhythm, normal heart sounds and intact distal pulses.  Exam reveals no gallop and no friction rub.   No murmur heard. Pulmonary/Chest: Effort normal. No respiratory distress. He has decreased breath sounds. He has no wheezes. He has no rales.  decreased breath  sounds entire left side, dullness to percussion; clear right-sided breath sounds  Abdominal: Soft. Bowel sounds are normal. He exhibits no distension and no mass. There is no tenderness.  Musculoskeletal: Normal range of motion. He exhibits no edema or tenderness.  2+ bilateral pedal edema  Lymphadenopathy:    He has no cervical adenopathy.  Neurological: He is alert. Coordination normal.  Skin: Skin is warm and dry. No rash noted. No erythema.  Psychiatric: He has a normal mood and affect. His behavior is normal.  Nursing note and vitals reviewed.   ED Course  THORACENTESIS Date/Time: 04/09/2016 12:45 PM Performed by: Noemi Chapel Authorized by: Noemi Chapel  Consent: Verbal consent obtained. Written consent obtained. Risks and benefits: risks, benefits and alternatives were discussed Consent given by: patient Patient understanding: patient states understanding of the procedure being performed Patient consent: the patient's understanding of the procedure matches consent given Procedure consent: procedure consent matches procedure scheduled Relevant documents: relevant documents present and verified Test results: test results available and properly labeled Site marked: the operative site was marked Imaging studies: imaging studies available Required items: required blood products, implants, devices, and special equipment available Patient identity confirmed: verbally with patient and hospital-assigned identification number Time out: Immediately prior to procedure a "time out" was called to verify the correct patient, procedure, equipment, support staff and site/side marked as required. Procedure purpose: diagnostic and therapeutic Indications: pleural effusion Preparation: Patient was prepped and draped in the usual sterile fashion. Local anesthesia used: yes Anesthesia: local infiltration Local anesthetic: lidocaine 1% with epinephrine Anesthetic total: 4 ml Patient sedated:  no Preparation: skin prepped with ChloraPrep Patient position: sitting Ultrasound guidance: yes Location: left posterior Intercostal space: 7th Puncture method: over-the-needle catheter Number of attempts: 1 Drainage amount: 800 ml Drainage characteristics: bloody and cloudy Patient tolerance: Patient tolerated the procedure well with no immediate complications Chest x-ray performed: yes Chest x-ray interpreted by me. Chest x-ray findings: pleural effusion Comments: Effusion improved - no PTX seen    DIAGNOSTIC STUDIES: Oxygen Saturation is 100% on room air, normal by my interpretation.    COORDINATION OF CARE: 9:25 AM Will order CTA chest, CXR, EKG, and blood work. Discussed treatment plan with pt and family at bedside and they agreed to plan.  Labs Review Labs Reviewed  CBC WITH DIFFERENTIAL/PLATELET - Abnormal; Notable for the following:    RBC 4.10 (*)    Hemoglobin 11.8 (*)    HCT 35.8 (*)    RDW 16.3 (*)    Platelets 415 (*)    All other components within normal limits  COMPREHENSIVE METABOLIC PANEL - Abnormal; Notable for the following:    Potassium 3.3 (*)    Glucose, Bld 112 (*)    BUN 23 (*)    Calcium 8.8 (*)    Albumin 2.9 (*)    All other components within normal limits  PROTIME-INR - Abnormal; Notable for the following:    Prothrombin Time 19.4 (*)    INR 1.63 (*)    All other components within normal limits  LIPASE, BLOOD - Abnormal; Notable for the following:    Lipase 170 (*)    All other components within normal limits  FUNGUS CULTURE WITH STAIN (NOT AT [facility])  TROPONIN I  BRAIN NATRIURETIC PEPTIDE  LACTATE DEHYDROGENASE  AMYLASE, PLEURAL FLUID  BODY FLUID CELL COUNT WITH DIFFERENTIAL  LACTATE DEHYDROGENASE, BODY FLUID  GLUCOSE, SEROUS FLUID  OTHER BODY FLUID CHEMISTRY  PH, BODY FLUID  PROTEIN, BODY FLUID  CYTOLOGY - NON PAP   Imaging Review Ct Chest W Contrast  04/09/2016  CLINICAL DATA:  Short of breath for 3 weeks.  Pancreatitis. EXAM:  CT CHEST WITH CONTRAST TECHNIQUE: Multidetector CT imaging of the chest was performed during intravenous contrast administration. CONTRAST:  41mL ISOVUE-300 IOPAMIDOL (ISOVUE-300) INJECTION 61% COMPARISON:  CT abdomen 10/11/2015 FINDINGS: Large left pleural effusion is present. There is compressive atelectasis of much of the left lung. On images 78 through 84, the left lower lobe bronchus is irregular and somewhat narrowed. Malignancy surrounding the left lower lobe bronchus cannot be excluded. There is no discrete mass identified by CT. There is a small right pleural effusion with  associated dependent atelectasis. There is a rounded filling defect in the right bronchus intermedius on image 78 which may simply represent mucous material. Small mediastinal nodes. Minimal left main, LAD territory, and circumflex coronary artery calcifications. No pneumothorax. The upper abdomen remains abnormal. Peripancreatic fluid density has improved. There continues to be fluid density surrounding the porta hepatis. The pancreatic body and tail are atrophic. There continues to be poor enhancement of the pancreatic head worrisome for necrosis. There is noted to be severe narrowing of the portal vein adjacent to the area of suspected pancreatic necrosis. Underlying mass in the pancreatic head cannot be excluded. 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 IMPRESSION: Large left pleural effusion. There is compressive atelectasis of much of the left lung. There is irregularity of the left lower lobe bronchus. Underlying bronchogenic lesion cannot be excluded. Small right pleural effusion and compressive atelectasis of the right lower lobe. Rounded density in the right bronchus intermedius may simply represent mucous material Findings related to acute complicated pancreatitis in the abdomen have improved. The continues to be fluid density about the pancreas and porta hepatis. There continues to be low density in the head of the pancreas  compressing the portal vein. This may represent necrosis but underlying mass is not excluded. 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 Electronically Signed   By: Marybelle Killings M.D.   On: 04/09/2016 11:30   Dg Chest Portable 1 View  04/09/2016  CLINICAL DATA:  Patient reports intermittent shortness of breath x1 week. Per patient progressively worse this morning at 4 am to point he could not lay down. Hx of HTN, heart murmur, diabetes. Ex-smoker. EXAM: PORTABLE CHEST 1 VIEW COMPARISON:  03/16/2016 FINDINGS: Extensive opacification over the lower 2/3 of the left lung consistent with large effusion and underlying consolidation. Shift of the heart mediastinal contents toward the right. Heart size difficult to assess therefore. Right lung appears clear. IMPRESSION: Extensive abnormal opacity left thorax consistent with large effusion and underlying consolidation. Electronically Signed   By: Skipper Cliche M.D.   On: 04/09/2016 09:30   I have personally reviewed and evaluated these images and lab results as part of my medical decision-making.    MDM   Final diagnoses:  Pleural effusion   I personally performed the services described in this documentation, which was scribed in my presence. The recorded information has been reviewed and is accurate.   The pt has a new large effusion He has no reason for this Consider CA, or infectious or other cause CT performed, confirms very large effusion Pt on coumadin - higher risk for procedure - he gave full consult after RB and A were explained 800cc of fluid removed successfully - well tolerated Sent for eval  D/w Dr. Caryn Section who will admit Post procedure xray performed    ED ECG REPORT  I personally interpreted this EKG   Date: 04/09/2016   Rate: 77  Rhythm: normal sinus rhythm  QRS Axis: normal  Intervals: normal  ST/T Wave abnormalities: T wave inversions inferior and lateral and lateral precordial leads  Conduction Disutrbances:none   Narrative Interpretation:   Old EKG Reviewed: unchanged - TWI seen on prior    Noemi Chapel, MD 04/09/16 1320

## 2016-04-09 NOTE — Progress Notes (Signed)
ANTICOAGULATION CONSULT NOTE - Initial Consult  Pharmacy Consult for coumadin Indication: DVT  Allergies  Allergen Reactions  . Allopurinol Other (See Comments)    Significantly decreased WBC's  . Colchicine Other (See Comments)    Significantly decreased WBC's    Patient Measurements: Height: 5\' 5"  (165.1 cm) Weight: 154 lb 1.6 oz (69.9 kg) IBW/kg (Calculated) : 61.5   Vital Signs: Temp: 97.8 F (36.6 C) (04/30 1331) Temp Source: Oral (04/30 1331) BP: 157/81 mmHg (04/30 1505) Pulse Rate: 80 (04/30 1505)  Labs:  Recent Labs  04/09/16 0945  HGB 11.8*  HCT 35.8*  PLT 415*  LABPROT 19.4*  INR 1.63*  CREATININE 1.08  TROPONINI 0.03    Estimated Creatinine Clearance: 53 mL/min (by C-G formula based on Cr of 1.08).   Medical History: Past Medical History  Diagnosis Date  . Hypertension   . Heart murmur   . ED (erectile dysfunction)   . Agranulocytosis ([place])   . Diabetes mellitus   . Hyperlipidemia   . Gout   . Adenomatous colon polyp   . Prostate cancer ([place])     Followed by Dr. Jeffie Pollock   . Pancreatitis   . Gallstones   . Allergy   . Internal hemorrhoids   . Diverticulosis   . 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    Medications:  Prescriptions prior to admission  Medication Sig Dispense Refill Last Dose  . amLODipine (NORVASC) 10 MG tablet Take 1 tablet (10 mg total) by mouth every evening. 90 tablet 2 04/08/2016 at Unknown time  . benazepril (LOTENSIN) 40 MG tablet TAKE ONE TABLET BY MOUTH ONCE DAILY 90 tablet 2 04/08/2016 at Unknown time  . febuxostat (ULORIC) 40 MG tablet Take 2 tablets (80 mg total) by mouth daily. 60 tablet 3 04/08/2016 at Unknown time  . finasteride (PROSCAR) 5 MG tablet Take 5 mg by mouth daily.   04/08/2016 at Unknown time  . fluticasone (FLONASE) 50 MCG/ACT nasal spray Place 1 spray into both nostrils 2 (two) times daily as needed for allergies or  rhinitis. 16 g 6 04/08/2016 at Unknown time  . furosemide (LASIX) 40 MG tablet Take 1 tablet (40 mg total) by mouth daily as needed for fluid. 30 tablet 1 04/08/2016 at Unknown time  . hydrALAZINE (APRESOLINE) 50 MG tablet Take 1 tablet (50 mg total) by mouth 3 (three) times daily. 90 tablet 1 04/08/2016 at Unknown time  . levocetirizine (XYZAL) 5 MG tablet Take 1 tablet (5 mg total) by mouth every evening. 30 tablet 1 04/08/2016 at Unknown time  . metFORMIN (GLUCOPHAGE) 500 MG tablet TAKE ONE TABLET BY MOUTH TWICE DAILY WITH A MEAL 180 tablet 1 04/08/2016 at Unknown time  . metoprolol succinate (TOPROL-XL) 100 MG 24 hr tablet Take 1 tablet (100 mg total) by mouth 1 day or 1 dose. (Patient taking differently: Take 100 mg by mouth daily. ) 90 tablet 2 04/08/2016 at Unknown time  . niacin (NIASPAN) 1000 MG CR tablet Take 1 tablet (1,000 mg total) by mouth at bedtime. 30 tablet 3 04/08/2016 at Unknown time  . omeprazole (PRILOSEC) 20 MG capsule Take 1 capsule (20 mg total) by mouth daily. 30 capsule 3 04/08/2016 at Unknown time  . pravastatin (PRAVACHOL) 40 MG tablet Take 40 mg by mouth daily.   04/08/2016 at Unknown time  . warfarin (COUMADIN) 1 MG tablet Take 1 tablet (1 mg total) by mouth daily. 30 tablet 1 04/08/2016  at Unknown time  . 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  INR is 1.63 Goal of Therapy:  INR 2-3 Monitor platelets by anticoagulation protocol: Yes   Plan:  Coumadin 6 mg po today Daily PT/INR Monitor for bleeding complications  Alan [name] 04/09/2016,5:10 PM

## 2016-04-09 NOTE — ED Notes (Signed)
Patient reports intermittent shortness of breath x1 week. Per patient progressively worse this morning at 4 am to point he could not lay down. Patient denies hx of asthma or COPD. Denies any chest pain. Patient does report occasional nonproductive cough. Denies any fevers.

## 2016-04-09 NOTE — H&P (Addendum)
History and Physical    Alan [name]:[id number] DOB: 1940/12/21 DOA: 04/09/2016  Referring MD/NP/PA: Dr. Sabra Heck PCP: Fransisca Kaufmann Dettinger, MD Outpatient Specialists: [facility] gastroenterology-GI; General surgery-Dr. Arnoldo Morale Patient coming from: Home  Chief Complaint: Shortness of breath  HPI: Alan Klutz. is a 75 y.o. 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 His shortness of breath started approximately 2-3 weeks ago, mostly with exertion. However, it has progressed to shortness of breath at rest and particularly when laying flat. He has had more swelling in his ankles and legs. He takes Lasix as needed for leg swelling. He denies history of congestive heart failure. He has had a rare cough with clear sputum. He denies chest pain, fever, chills, abdominal pain, nausea, vomiting, diarrhea, pain with urination, black tarry stools, or bright red blood in his stools.   ED Course: In the ED, he was afebrile and hemodynamically stable. He was oxygenating in the 90s on room air. His chest x-ray revealed extensive abnormal opacity in the left thorax consistent with large effusion and underlying consolidation. Contrasted CT of the chest revealed large left pleural effusion with compressive atelectasis of the left long; irregularity of the left lower lobe bronchus; small right pleural effusion and compressive atelectasis of the right lower lobe; peripancreatic fluid density has improved; etc. Lab data significant for serum potassium was 3.3, lipase of 170, normal LFTs, hemoglobin 11.8, BNP is 76, troponin I of 0.03,. INR of 1.63, glucose of 112.  He is being admitted for further evaluation and management.  Review of Systems: As per HPI otherwise 10  point review of systems negative.    Past Medical History  Diagnosis Date  . Hypertension   . Heart murmur   . ED (erectile dysfunction)   . Agranulocytosis ([place])   . Diabetes mellitus   . Hyperlipidemia   . Gout   . Adenomatous colon polyp   . Prostate cancer ([place])     Followed by Dr. Jeffie Pollock   . Pancreatitis   . Gallstones   . Allergy   . Internal hemorrhoids   . Diverticulosis   . 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    Past Surgical History  Procedure Laterality Date  . Colonoscopy  2012    [facility] : moderate sigmoid diverticulosis, internal hemorrhoids. next tcs 2017  . Sigmoidoscopy    . Cholecystectomy N/A 11/26/2015    Procedure: LAPAROSCOPIC CHOLECYSTECTOMY;  Surgeon: Aviva Signs, MD;  Location: [facility];  Service: General;  Laterality: N/A;  . Umbilical hernia repair N/A 11/26/2015    Procedure: UMBILICAL HERNIORRHAPHY;  Surgeon: Aviva Signs, MD;  Location: [facility];  Service: General;  Laterality: N/A;    Social history: Patient is married. He is retired. He has children. He still drives. He reports that he quit smoking about 29 years ago. His smoking use included Cigarettes. He has never used smokeless tobacco. He reports that he does not drink alcohol or use illicit drugs.  Allergies  Allergen Reactions  . Allopurinol Other (See Comments)    Significantly decreased WBC's  . Colchicine Other (See Comments)    Significantly decreased WBC's    Family History  Problem Relation Age of Onset  . Stroke Mother   . Hypertension Mother   .  Alzheimer's disease Mother   . Hypertension Father   . Cancer Sister     brain  . Cancer Sister     breast  . Colon cancer Neg Hx   . Pancreatic disease Neg Hx   . Deep vein thrombosis Brother   . Hypertension Brother      Prior to Admission medications   Medication Sig Start Date End Date Taking? Authorizing Provider  amLODipine  (NORVASC) 10 MG tablet Take 1 tablet (10 mg total) by mouth every evening. 09/08/15  Yes Wardell Honour, MD  benazepril (LOTENSIN) 40 MG tablet TAKE ONE TABLET BY MOUTH ONCE DAILY 09/08/15  Yes Wardell Honour, MD  febuxostat (ULORIC) 40 MG tablet Take 2 tablets (80 mg total) by mouth daily. 09/08/15  Yes Wardell Honour, MD  finasteride (PROSCAR) 5 MG tablet Take 5 mg by mouth daily. 10/25/15  Yes Historical Provider, MD  fluticasone (FLONASE) 50 MCG/ACT nasal spray Place 1 spray into both nostrils 2 (two) times daily as needed for allergies or rhinitis. 11/05/15  Yes Fransisca Kaufmann Dettinger, MD  furosemide (LASIX) 40 MG tablet Take 1 tablet (40 mg total) by mouth daily as needed for fluid. 10/19/15  Yes Kathie Dike, MD  hydrALAZINE (APRESOLINE) 50 MG tablet Take 1 tablet (50 mg total) by mouth 3 (three) times daily. 01/21/16  Yes Chipper Herb, MD  levocetirizine (XYZAL) 5 MG tablet Take 1 tablet (5 mg total) by mouth every evening. 11/05/15  Yes Fransisca Kaufmann Dettinger, MD  metFORMIN (GLUCOPHAGE) 500 MG tablet TAKE ONE TABLET BY MOUTH TWICE DAILY WITH A MEAL 01/24/16  Yes Fransisca Kaufmann Dettinger, MD  metoprolol succinate (TOPROL-XL) 100 MG 24 hr tablet Take 1 tablet (100 mg total) by mouth 1 day or 1 dose. Patient taking differently: Take 100 mg by mouth daily.  09/08/15  Yes Wardell Honour, MD  niacin (NIASPAN) 1000 MG CR tablet Take 1 tablet (1,000 mg total) by mouth at bedtime. 09/08/15  Yes Wardell Honour, MD  omeprazole (PRILOSEC) 20 MG capsule Take 1 capsule (20 mg total) by mouth daily. 01/22/16  Yes Sharion Balloon, FNP  pravastatin (PRAVACHOL) 40 MG tablet Take 40 mg by mouth daily.   Yes Historical Provider, MD  warfarin (COUMADIN) 1 MG tablet Take 1 tablet (1 mg total) by mouth daily. 01/07/16  Yes Fransisca Kaufmann Dettinger, MD  warfarin (COUMADIN) 5 MG tablet TAKE ONE TABLET BY MOUTH ONCE DAILY AT  6  PM 03/15/16  Yes Fransisca Kaufmann Dettinger, MD    Physical Exam: Filed Vitals:   04/09/16 1200 04/09/16 1253  04/09/16 1301 04/09/16 1331  BP: 145/70  151/77 166/75  Pulse: 73 85 81 85  Temp:    97.8 F (36.6 C)  TempSrc:    Oral  Resp: 22 16 21    Height:    5\' 5"  (1.651 m)  Weight:    69.9 kg (154 lb 1.6 oz)  SpO2: 94% 95% 95% 95%      Constitutional:Pleasant 75 year old African-American man in no acute distress. Filed Vitals:   04/09/16 1200 04/09/16 1253 04/09/16 1301 04/09/16 1331  BP: 145/70  151/77 166/75  Pulse: 73 85 81 85  Temp:    97.8 F (36.6 C)  TempSrc:    Oral  Resp: 22 16 21    Height:    5\' 5"  (1.651 m)  Weight:    69.9 kg (154 lb 1.6 oz)  SpO2: 94% 95% 95% 95%   Eyes: PERRL, lids and conjunctivae  normal ENMT: Mucous membranes are Mildly dry. Posterior pharynx clear of any exudate or lesions.Normal dentition.  Neck: normal, supple, no masses, no thyromegaly Respiratory: Globally decreased breath sounds in the bases with slightly more air movement on the right; occasional crackles on the right greater than left. Normal respiratory effort. No accessory muscle use.  Cardiovascular: Regular rate and rhythm, no murmurs / rubs / gallops. 2+ pedal pulses. No carotid bruits. 2+ bilateral lower extremity pedal edema. Abdomen: no tenderness, no masses palpated. No hepatosplenomegaly. Bowel sounds positive.  Musculoskeletal: no clubbing / cyanosis. No joint deformity upper and lower extremities. Good ROM, no contractures. Normal muscle tone.  Skin: no rashes, lesions, ulcers. No induration Neurologic: CN 2-12 grossly intact. Sensation intact, Strength 5/5 in all 4.  Psychiatric: Normal judgment and insight. Alert and oriented x 3. Normal mood.    Labs on Admission: I have personally reviewed following labs and imaging studies  CBC:  Recent Labs Lab 04/09/16 0945  WBC 7.9  NEUTROABS 5.2  HGB 11.8*  HCT 35.8*  MCV 87.3  PLT [id number]*   Basic Metabolic Panel:  Recent Labs Lab 04/09/16 0945  NA 142  K 3.3*  CL 104  CO2 27  GLUCOSE 112*  BUN 23*  CREATININE 1.08    CALCIUM 8.8*   GFR: Estimated Creatinine Clearance: 53 mL/min (by C-G formula based on Cr of 1.08). Liver Function Tests:  Recent Labs Lab 04/09/16 0945  AST 21  ALT 17  ALKPHOS 81  BILITOT 0.9  PROT 6.5  ALBUMIN 2.9*    Recent Labs Lab 04/09/16 0945  LIPASE 170*   No results for input(s): AMMONIA in the last 168 hours. Coagulation Profile:  Recent Labs Lab 04/09/16 0945  INR 1.63*   Cardiac Enzymes:  Recent Labs Lab 04/09/16 0945  TROPONINI 0.03   BNP (last 3 results) No results for input(s): PROBNP in the last 8760 hours. HbA1C: No results for input(s): HGBA1C in the last 72 hours. CBG: No results for input(s): GLUCAP in the last 168 hours. Lipid Profile: No results for input(s): CHOL, HDL, LDLCALC, TRIG, CHOLHDL, LDLDIRECT in the last 72 hours. Thyroid Function Tests: No results for input(s): TSH, T4TOTAL, FREET4, T3FREE, THYROIDAB in the last 72 hours. Anemia Panel: No results for input(s): VITAMINB12, FOLATE, FERRITIN, TIBC, IRON, RETICCTPCT in the last 72 hours. Urine analysis: No results found for: COLORURINE, APPEARANCEUR, LABSPEC, PHURINE, GLUCOSEU, HGBUR, BILIRUBINUR, KETONESUR, PROTEINUR, UROBILINOGEN, NITRITE, LEUKOCYTESUR Sepsis Labs: @LABRCNTIP (procalcitonin:4,lacticidven:4) ) Recent Results (from the past 240 hour(s))  Culture, body fluid-bottle     Status: None (Preliminary result)   Collection Time: 04/09/16 12:59 PM  Result Value Ref Range Status   Specimen Description PLEURAL  Final   Special Requests BOTTLES DRAWN AEROBIC AND ANAEROBIC 5CC EACH  Final   Culture PENDING  Incomplete   Report Status PENDING  Incomplete     Radiological Exams on Admission: Dg Chest 2 View  04/09/2016  CLINICAL DATA:  Thoracentesis EXAM: CHEST  2 VIEW COMPARISON:  900 hours today FINDINGS: Left pleural effusion has improved. There is no left pneumothorax. Small right pleural effusion and basilar atelectasis. Normal heart size. IMPRESSION: No  pneumothorax post left thoracentesis. Electronically Signed   By: Marybelle Killings M.D.   On: 04/09/2016 13:55   Ct Chest W Contrast  04/09/2016  CLINICAL DATA:  Short of breath for 3 weeks.  Pancreatitis. EXAM: CT CHEST WITH CONTRAST TECHNIQUE: Multidetector CT imaging of the chest was performed during intravenous contrast administration. CONTRAST:  35mL ISOVUE-300  IOPAMIDOL (ISOVUE-300) INJECTION 61% COMPARISON:  CT abdomen 10/11/2015 FINDINGS: Large left pleural effusion is present. There is compressive atelectasis of much of the left lung. On images 78 through 84, the left lower lobe bronchus is irregular and somewhat narrowed. Malignancy surrounding the left lower lobe bronchus cannot be excluded. There is no discrete mass identified by CT. There is a small right pleural effusion with associated dependent atelectasis. There is a rounded filling defect in the right bronchus intermedius on image 78 which may simply represent mucous material. Small mediastinal nodes. Minimal left main, LAD territory, and circumflex coronary artery calcifications. No pneumothorax. The upper abdomen remains abnormal. Peripancreatic fluid density has improved. There continues to be fluid density surrounding the porta hepatis. The pancreatic body and tail are atrophic. There continues to be poor enhancement of the pancreatic head worrisome for necrosis. There is noted to be severe narrowing of the portal vein adjacent to the area of suspected pancreatic necrosis. Underlying mass in the pancreatic head cannot be excluded. 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 IMPRESSION: Large left pleural effusion. There is compressive atelectasis of much of the left lung. There is irregularity of the left lower lobe bronchus. Underlying bronchogenic lesion cannot be excluded. Small right pleural effusion and compressive atelectasis of the right lower lobe. Rounded density in the right bronchus intermedius may simply represent mucous material  Findings related to acute complicated pancreatitis in the abdomen have improved. The continues to be fluid density about the pancreas and porta hepatis. There continues to be low density in the head of the pancreas compressing the portal vein. This may represent necrosis but underlying mass is not excluded. 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 Electronically Signed   By: Marybelle Killings M.D.   On: 04/09/2016 11:30   Dg Chest Portable 1 View  04/09/2016  CLINICAL DATA:  Patient reports intermittent shortness of breath x1 week. Per patient progressively worse this morning at 4 am to point he could not lay down. Hx of HTN, heart murmur, diabetes. Ex-smoker. EXAM: PORTABLE CHEST 1 VIEW COMPARISON:  03/16/2016 FINDINGS: Extensive opacification over the lower 2/3 of the left lung consistent with large effusion and underlying consolidation. Shift of the heart mediastinal contents toward the right. Heart size difficult to assess therefore. Right lung appears clear. IMPRESSION: Extensive abnormal opacity left thorax consistent with large effusion and underlying consolidation. Electronically Signed   By: Skipper Cliche M.D.   On: 04/09/2016 09:30    EKG: Not ordered.  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 Large left pleural effusion with associated left lung compressive atelectasis and irregular left lower lobe bronchus. The etiology of this finding is unclear and unknown at this time. The findings are concerning for a possible underlying malignancy. However, inflammatory changes from the necrotizing pancreatitis 6 months ago may have caused chronic or subacute pleural effusion. Per his history, he stop smoking decades ago. -EDP Dr.  Sabra Heck performed a thoracentesis in the ED, yielding 850 cc of fluid. Patient is symptomatically improved. Surprisingly he is not hypoxic and he is not wheezing. He is not febrile nor does he have a leukocytosis. So, we'll hold on starting scheduled nebs and antibiotics. -Studies sent, including for cytology. Results pending. [facility] consult pulmonology for further  evaluation and management recommendations.  Peripheral edema. Patient is noted to have 2+ bilateral lower extremity edema. He takes Lasix when necessary for swelling. His BNP was within normal limits on admission. -We'll hold oral Lasix and give Lasix 20 mg IV every 12 hours. -We'll order an echocardiogram for further evaluation and management.  Abnormal pancreas. Patient has a history of biliary pancreatitis and necrotizing pancreatitis in October/November 2016. He underwent cholecystectomy by Dr. Arnoldo Morale 10/2015. He was also followed by [facility]. CT on admission revealed improvement in the peripancreatic fluid density, but there continue to be fluid density surrounding the porta hepatis; pancreatic body and tail atrophic, and poor enhancement of the pancreatic head worrisome for necrosis; severe narrowing of the portal vein adjacent to the suspected pancreatic necrosis; mass in the pancreatic head not excluded; development of gastroesophageal varices. -Patient's lipase was modestly elevated at 170, but patient has no abdominal tenderness and no complaints of abdominal pain, nausea, and vomiting. -We'll order an abdominal ultrasound for further evaluation. [facility] consider general surgery and gastroenterology consultation.  Embolism/thrombosis of the splenic vein. Apparently this was diagnosed during the hospitalization for biliary pancreatitis. At that time, he was started on Coumadin. -His INR is slightly supple therapeutic. Will consult pharmacy to assist with dosing.  Hypertension. Patient is treated chronically  with amlodipine, benazepril, hydralazine, and Toprol-XL. These medications will be continued.  Diabetes mellitus, type II. Patient is treated chronically with metformin. This will be held in light of the IV contrast given for the CT of the abdomen. -Start treatment with sliding scale NovoLog.   DVT prophylaxis: On Coumadin Code Status: Full code Family Communication: Discussed with patient's wife and son. Disposition Plan: Discharged to home in clinically appropriate, likely in a few days. Consults called: None yet Admission status: Inpatient   FISHER,DENISE MD [facility] Pager [phone]  If 7PM-7AM, please contact night-coverage [email or web address] Password [email or web address]  04/09/2016, 2:35 PM   08 40 04/14/2016 I have reviewed his history and physical reexamined him and found that he has less pleural effusion than he did on admission but otherwise no change. He is okay for planned bronchoscopy

## 2016-04-10 ENCOUNTER — Inpatient Hospital Stay (HOSPITAL_COMMUNITY): Payer: Medicare Other

## 2016-04-10 DIAGNOSIS — N400 Enlarged prostate without lower urinary tract symptoms: Secondary | ICD-10-CM

## 2016-04-10 DIAGNOSIS — J948 Other specified pleural conditions: Secondary | ICD-10-CM

## 2016-04-10 DIAGNOSIS — J918 Pleural effusion in other conditions classified elsewhere: Secondary | ICD-10-CM

## 2016-04-10 DIAGNOSIS — I480 Paroxysmal atrial fibrillation: Secondary | ICD-10-CM

## 2016-04-10 DIAGNOSIS — R932 Abnormal findings on diagnostic imaging of liver and biliary tract: Secondary | ICD-10-CM

## 2016-04-10 HISTORY — DX: Paroxysmal atrial fibrillation: I48.0

## 2016-04-10 LAB — COMPREHENSIVE METABOLIC PANEL
ALBUMIN: 2.8 g/dL — AB (ref 3.5–5.0)
ALK PHOS: 81 U/L (ref 38–126)
ALT: 17 U/L (ref 17–63)
AST: 22 U/L (ref 15–41)
Anion gap: 10 (ref 5–15)
BILIRUBIN TOTAL: 1.4 mg/dL — AB (ref 0.3–1.2)
BUN: 18 mg/dL (ref 6–20)
CALCIUM: 8.8 mg/dL — AB (ref 8.9–10.3)
CO2: 28 mmol/L (ref 22–32)
CREATININE: 1.05 mg/dL (ref 0.61–1.24)
Chloride: 104 mmol/L (ref 101–111)
GFR calc Af Amer: >60 mL/min (ref >60)
GLUCOSE: 101 mg/dL — AB (ref 65–99)
Potassium: 3.7 mmol/L (ref 3.5–5.1)
Sodium: 142 mmol/L (ref 135–145)
TOTAL PROTEIN: 6.2 g/dL — AB (ref 6.5–8.1)

## 2016-04-10 LAB — GLUCOSE, CAPILLARY
GLUCOSE-CAPILLARY: 101 mg/dL — AB (ref 65–99)
GLUCOSE-CAPILLARY: 136 mg/dL — AB (ref 65–99)
Glucose-Capillary: 90 mg/dL (ref 65–99)
Glucose-Capillary: 113 mg/dL — ABNORMAL HIGH (ref 65–99)

## 2016-04-10 LAB — CBC
HEMATOCRIT: 37.7 % — AB (ref 39.0–52.0)
Hemoglobin: 12.2 g/dL — ABNORMAL LOW (ref 13.0–17.0)
MCH: 28.3 pg (ref 26.0–34.0)
MCHC: 32.4 g/dL (ref 30.0–36.0)
MCV: 87.5 fL (ref 78.0–100.0)
PLATELETS: 404 10*3/uL — AB (ref 150–400)
RBC: 4.31 MIL/uL (ref 4.22–5.81)
RDW: 16.5 % — AB (ref 11.5–15.5)
WBC: 8.9 10*3/uL (ref 4.0–10.5)

## 2016-04-10 LAB — AMYLASE: AMYLASE: 232 U/L — AB (ref 28–100)

## 2016-04-10 LAB — ECHOCARDIOGRAM COMPLETE
Height: 65 in
Weight: 2465.62 oz

## 2016-04-10 LAB — HEPARIN LEVEL (UNFRACTIONATED): Heparin Unfractionated: 0.26 IU/mL — ABNORMAL LOW (ref 0.30–0.70)

## 2016-04-10 LAB — LIPASE, BLOOD: LIPASE: 128 U/L — AB (ref 11–51)

## 2016-04-10 LAB — MAGNESIUM: MAGNESIUM: 1.7 mg/dL (ref 1.7–2.4)

## 2016-04-10 LAB — TSH: TSH: 1.842 u[IU]/mL (ref 0.350–4.500)

## 2016-04-10 LAB — TROPONIN I: TROPONIN I: 0.03 ng/mL (ref <0.031)

## 2016-04-10 LAB — PROTIME-INR
INR: 1.9 — ABNORMAL HIGH (ref 0.00–1.49)
Prothrombin Time: 21.7 seconds — ABNORMAL HIGH (ref 11.6–15.2)

## 2016-04-10 MED ORDER — HEPARIN (PORCINE) IN NACL 100-0.45 UNIT/ML-% IJ SOLN
1300.0000 [IU]/h | INTRAMUSCULAR | Status: DC
  Administered 2016-04-10: 1100 [IU]/h via INTRAVENOUS
  Administered 2016-04-11: 1200 [IU]/h via INTRAVENOUS
  Filled 2016-04-10 (×2): qty 250

## 2016-04-10 MED ORDER — FUROSEMIDE 40 MG PO TABS
40.0000 mg | ORAL_TABLET | Freq: Every day | ORAL | Status: DC
  Administered 2016-04-11 – 2016-04-20 (×9): 40 mg via ORAL
  Filled 2016-04-10 (×10): qty 1

## 2016-04-10 MED ORDER — SIMETHICONE 80 MG PO CHEW
80.0000 mg | CHEWABLE_TABLET | Freq: Four times a day (QID) | ORAL | Status: DC | PRN
  Administered 2016-04-10 – 2016-04-16 (×5): 80 mg via ORAL
  Filled 2016-04-10 (×5): qty 1

## 2016-04-10 MED ORDER — POTASSIUM CHLORIDE CRYS ER 20 MEQ PO TBCR
20.0000 meq | EXTENDED_RELEASE_TABLET | Freq: Every day | ORAL | Status: DC
  Administered 2016-04-10 – 2016-04-20 (×10): 20 meq via ORAL
  Filled 2016-04-10 (×11): qty 1

## 2016-04-10 MED ORDER — PANCRELIPASE (LIP-PROT-AMYL) 12000-38000 UNITS PO CPEP
12000.0000 [IU] | ORAL_CAPSULE | Freq: Three times a day (TID) | ORAL | Status: DC
  Administered 2016-04-10 – 2016-04-20 (×29): 12000 [IU] via ORAL
  Filled 2016-04-10 (×30): qty 1
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
## 2016-04-10 NOTE — Progress Notes (Signed)
ANTICOAGULATION CONSULT NOTE - Initial Consult  Pharmacy Consult for Heparin Indication: Embolism and thrombosis of splenic artery  Allergies  Allergen Reactions  . Allopurinol Other (See Comments)    Significantly decreased WBC's  . Colchicine Other (See Comments)    Significantly decreased WBC's    Patient Measurements: Height: 5\' 5"  (165.1 cm) Weight: 154 lb 1.6 oz (69.9 kg) IBW/kg (Calculated) : 61.5 HEPARIN DW (KG): 69.9   Vital Signs: Temp: 98.8 F (37.1 C) (05/01 0753) Temp Source: Oral (05/01 0753) BP: 131/69 mmHg (05/01 0700) Pulse Rate: 78 (05/01 0754)  Labs:  Recent Labs  04/09/16 0945 04/10/16 0412  HGB 11.8* 12.2*  HCT 35.8* 37.7*  PLT 415* 404*  LABPROT 19.4* 21.7*  INR 1.63* 1.90*  CREATININE 1.08 1.05  TROPONINI 0.03 0.03    Estimated Creatinine Clearance: 54.5 mL/min (by C-G formula based on Cr of 1.05).   Medical History: Past Medical History  Diagnosis Date  . Hypertension   . Heart murmur   . ED (erectile dysfunction)   . Agranulocytosis ([place])   . Diabetes mellitus   . Hyperlipidemia   . Gout   . Adenomatous colon polyp   . Prostate cancer ([place])     Followed by Dr. Jeffie Pollock   . Pancreatitis   . Gallstones   . Allergy   . Internal hemorrhoids   . Diverticulosis   . 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    Medications:  Prescriptions prior to admission  Medication Sig Dispense Refill Last Dose  . amLODipine (NORVASC) 10 MG tablet Take 1 tablet (10 mg total) by mouth every evening. 90 tablet 2 04/08/2016 at Unknown time  . benazepril (LOTENSIN) 40 MG tablet TAKE ONE TABLET BY MOUTH ONCE DAILY 90 tablet 2 04/08/2016 at Unknown time  . febuxostat (ULORIC) 40 MG tablet Take 2 tablets (80 mg total) by mouth daily. 60 tablet 3 04/08/2016 at Unknown time  . finasteride (PROSCAR) 5 MG tablet Take 5 mg by mouth daily.   04/08/2016 at Unknown time  . fluticasone  (FLONASE) 50 MCG/ACT nasal spray Place 1 spray into both nostrils 2 (two) times daily as needed for allergies or rhinitis. 16 g 6 04/08/2016 at Unknown time  . furosemide (LASIX) 40 MG tablet Take 1 tablet (40 mg total) by mouth daily as needed for fluid. 30 tablet 1 04/08/2016 at Unknown time  . hydrALAZINE (APRESOLINE) 50 MG tablet Take 1 tablet (50 mg total) by mouth 3 (three) times daily. 90 tablet 1 04/08/2016 at Unknown time  . levocetirizine (XYZAL) 5 MG tablet Take 1 tablet (5 mg total) by mouth every evening. 30 tablet 1 04/08/2016 at Unknown time  . metFORMIN (GLUCOPHAGE) 500 MG tablet TAKE ONE TABLET BY MOUTH TWICE DAILY WITH A MEAL 180 tablet 1 04/08/2016 at Unknown time  . metoprolol succinate (TOPROL-XL) 100 MG 24 hr tablet Take 1 tablet (100 mg total) by mouth 1 day or 1 dose. (Patient taking differently: Take 100 mg by mouth daily. ) 90 tablet 2 04/08/2016 at Unknown time  . niacin (NIASPAN) 1000 MG CR tablet Take 1 tablet (1,000 mg total) by mouth at bedtime. 30 tablet 3 04/08/2016 at Unknown time  . omeprazole (PRILOSEC) 20 MG capsule Take 1 capsule (20 mg total) by mouth daily. 30 capsule 3 04/08/2016 at Unknown time  . pravastatin (PRAVACHOL) 40 MG tablet Take 40 mg by mouth daily.   04/08/2016 at Unknown time  .  warfarin (COUMADIN) 1 MG tablet Take 1 tablet (1 mg total) by mouth daily. 30 tablet 1 04/08/2016 at Unknown time  . warfarin (COUMADIN) 5 MG tablet TAKE ONE TABLET BY MOUTH ONCE DAILY AT  6  PM 30 tablet 5 04/08/2016 at Unknown time    Assessment: 75 yo male with h/o thrombosis of splenic artery 10/2015 which he is on Coumadin. Coumadin on hold and anticoagulating with Heparin in anticipation of possible bronchoscopy. INR 1.9 today. Will not bolus.  Goal of Therapy:  Heparin level 0.3-0.7 units/ml Monitor platelets by anticoagulation protocol: Yes   Plan:  Hold Coumadin until brochoscopy done Start heparin infusion at 1100 units/hr Check anti-Xa level in 8 hours and daily  while on heparin Continue to monitor H&H and platelets  Isac Sarna, BS [place], BCPS Clinical Pharmacist Pager [phone] 04/10/2016,9:46 AM

## 2016-04-10 NOTE — Progress Notes (Signed)
Pt left the floor with nursing staff.  Pt in no distress.  Belongings, paper chart, and medications sent with patient.  Report called to Dept. 300 RN.   Vitals signs as follows:  Temp: 98 F (36.7 C) (05/01 1133) Temp Source: Oral (05/01 1133) BP: 135/68 mmHg (05/01 1500) Pulse Rate: 66 (05/01 1500) Respirations: 20

## 2016-04-10 NOTE — Consult Note (Signed)
Consult requested by: Dr. Caryn Section Consult requested for abnormal chest CT:  HPI: This is a 75 year old who had a severe medical illness with severe pancreatitis last year. He says he's had a slow recovery. He came to the emergency department because of shortness of breath was found to have a large left pleural effusion. He still has some fluid in his abdomen as well. CT shows pleural effusion and some irregularity of the bronchus but there is significant crowding of the bronchi because of a pleural effusion. This was done prior to thoracentesis. He had thoracentesis done with removal of about 800 mL of fluid. He says he feels better. 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  Past Medical History  Diagnosis Date  . Hypertension   . Heart murmur   . ED (erectile dysfunction)   . Agranulocytosis ([place])   . Diabetes mellitus   . Hyperlipidemia   . Gout   . Adenomatous colon polyp   . Prostate cancer ([place])     Followed by Dr. Jeffie Pollock   . Pancreatitis   . Gallstones   . Allergy   . Internal hemorrhoids   . Diverticulosis   . 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     Family History  Problem Relation Age of Onset  . Stroke Mother   . Hypertension Mother   . Alzheimer's disease Mother   . Hypertension Father   . Cancer Sister     brain  . Cancer Sister     breast  . Colon cancer Neg Hx   . Pancreatic disease Neg Hx   . Deep vein thrombosis Brother   . Hypertension Brother      Social History   Social History  . Marital Status: Married    Spouse Name: N/A  . Number of Children: N/A  . Years of Education: N/A   Social History Main Topics  . Smoking status: Former Smoker    Types: Cigarettes    Quit date: 12/11/1986  . Smokeless tobacco: Never Used  . Alcohol Use: No  . Drug Use: No  .  Sexual Activity: Yes   Other Topics Concern  . None   Social History Narrative     ROS: He's not had any cough or congestion. He says his breathing is much better. No chest pain. No hemoptysis. Otherwise per the history and physical    Objective: Vital signs in last 24 hours: Temp:  [97 F (36.1 C)-98.8 F (37.1 C)] 98.8 F (37.1 C) (05/01 0753) Pulse Rate:  [65-86] 78 (05/01 0754) Resp:  [16-28] 22 (05/01 0700) BP: (112-166)/(46-83) 131/69 mmHg (05/01 0700) SpO2:  [93 %-100 %] 95 % (05/01 0700) Weight:  [69.9 kg (154 lb 1.6 oz)-76.204 kg (168 lb)] 69.9 kg (154 lb 1.6 oz) (04/30 1331) Weight change:  Last BM Date: 04/09/16  Intake/Output from previous day: 04/30 0701 - 05/01 0700 In: 1016.7 [P.O.:540; I.V.:476.7] Out: 2500 [Urine:2500]  PHYSICAL EXAM He is awake and alert. He is in no distress. He looks pretty comfortable now. His pupils are reactive nose and throat are clear mucous membranes are moist his neck is supple without masses. His chest shows diminished breath sounds on the left. Heart is regular without gallop. His abdomen is soft. Extremities showed no edema. Central nervous system exam grossly intact  Lab Results:  Basic Metabolic Panel:  Recent Labs  04/09/16 0945 04/10/16 0412  NA 142 142  K 3.3* 3.7  CL 104 104  CO2 27 28  GLUCOSE 112* 101*  BUN 23* 18  CREATININE 1.08 1.05  CALCIUM 8.8* 8.8*  MG  --  1.7   Liver Function Tests:  Recent Labs  04/09/16 0945 04/10/16 0412  AST 21 22  ALT 17 17  ALKPHOS 81 81  BILITOT 0.9 1.4*  PROT 6.5 6.2*  ALBUMIN 2.9* 2.8*    Recent Labs  04/09/16 0945 04/10/16 0412  LIPASE 170* 128*  AMYLASE  --  232*   No results for input(s): AMMONIA in the last 72 hours. CBC:  Recent Labs  04/09/16 0945 04/10/16 0412  WBC 7.9 8.9  NEUTROABS 5.2  --   HGB 11.8* 12.2*  HCT 35.8* 37.7*  MCV 87.3 87.5  PLT 415* 404*   Cardiac Enzymes:  Recent Labs  04/09/16 0945 04/10/16 0412  TROPONINI 0.03  0.03   BNP: No results for input(s): PROBNP in the last 72 hours. D-Dimer: No results for input(s): DDIMER in the last 72 hours. CBG:  Recent Labs  04/09/16 1643 04/09/16 2105 04/10/16 0727  GLUCAP 80 93 101*   Hemoglobin A1C: No results for input(s): HGBA1C in the last 72 hours. Fasting Lipid Panel: No results for input(s): CHOL, HDL, LDLCALC, TRIG, CHOLHDL, LDLDIRECT in the last 72 hours. Thyroid Function Tests:  Recent Labs  04/10/16 0412  TSH 1.842   Anemia Panel: No results for input(s): VITAMINB12, FOLATE, FERRITIN, TIBC, IRON, RETICCTPCT in the last 72 hours. Coagulation:  Recent Labs  04/09/16 0945 04/10/16 0412  LABPROT 19.4* 21.7*  INR 1.63* 1.90*   Urine Drug Screen: Drugs of Abuse  No results found for: LABOPIA, COCAINSCRNUR, LABBENZ, AMPHETMU, THCU, LABBARB  Alcohol Level: No results for input(s): ETH in the last 72 hours. Urinalysis: No results for input(s): COLORURINE, LABSPEC, PHURINE, GLUCOSEU, HGBUR, BILIRUBINUR, KETONESUR, PROTEINUR, UROBILINOGEN, NITRITE, LEUKOCYTESUR in the last 72 hours.  Invalid input(s): APPERANCEUR Misc. Labs:   ABGS: No results for input(s): PHART, PO2ART, TCO2, HCO3 in the last 72 hours.  Invalid input(s): PCO2   MICROBIOLOGY: Recent Results (from the past 240 hour(s))  Culture, body fluid-bottle     Status: None (Preliminary result)   Collection Time: 04/09/16 12:59 PM  Result Value Ref Range Status   Specimen Description PLEURAL  Final   Special Requests BOTTLES DRAWN AEROBIC AND ANAEROBIC 5CC EACH  Final   Culture PENDING  Incomplete   Report Status PENDING  Incomplete  MRSA PCR Screening     Status: None   Collection Time: 04/09/16  1:44 PM  Result Value Ref Range Status   MRSA by PCR NEGATIVE NEGATIVE Final    Comment:        The GeneXpert MRSA Assay (FDA approved for NASAL specimens only), is one component of a comprehensive MRSA colonization surveillance program. It is not intended to diagnose  MRSA infection nor to guide or monitor treatment for MRSA infections.     Studies/Results: Dg Chest 2 View  04/09/2016  CLINICAL DATA:  Thoracentesis EXAM: CHEST  2 VIEW COMPARISON:  900 hours today FINDINGS: Left pleural effusion has improved. There is no left pneumothorax. Small right pleural effusion and basilar atelectasis. Normal heart size. IMPRESSION: No pneumothorax post left thoracentesis. Electronically Signed   By: Marybelle Killings M.D.   On: 04/09/2016 13:55   Ct Chest W Contrast  04/09/2016  CLINICAL DATA:  Short of breath  for 3 weeks.  Pancreatitis. EXAM: CT CHEST WITH CONTRAST TECHNIQUE: Multidetector CT imaging of the chest was performed during intravenous contrast administration. CONTRAST:  62mL ISOVUE-300 IOPAMIDOL (ISOVUE-300) INJECTION 61% COMPARISON:  CT abdomen 10/11/2015 FINDINGS: Large left pleural effusion is present. There is compressive atelectasis of much of the left lung. On images 78 through 84, the left lower lobe bronchus is irregular and somewhat narrowed. Malignancy surrounding the left lower lobe bronchus cannot be excluded. There is no discrete mass identified by CT. There is a small right pleural effusion with associated dependent atelectasis. There is a rounded filling defect in the right bronchus intermedius on image 78 which may simply represent mucous material. Small mediastinal nodes. Minimal left main, LAD territory, and circumflex coronary artery calcifications. No pneumothorax. The upper abdomen remains abnormal. Peripancreatic fluid density has improved. There continues to be fluid density surrounding the porta hepatis. The pancreatic body and tail are atrophic. There continues to be poor enhancement of the pancreatic head worrisome for necrosis. There is noted to be severe narrowing of the portal vein adjacent to the area of suspected pancreatic necrosis. Underlying mass in the pancreatic head cannot be excluded. 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  IMPRESSION: Large left pleural effusion. There is compressive atelectasis of much of the left lung. There is irregularity of the left lower lobe bronchus. Underlying bronchogenic lesion cannot be excluded. Small right pleural effusion and compressive atelectasis of the right lower lobe. Rounded density in the right bronchus intermedius may simply represent mucous material Findings related to acute complicated pancreatitis in the abdomen have improved. The continues to be fluid density about the pancreas and porta hepatis. There continues to be low density in the head of the pancreas compressing the portal vein. This may represent necrosis but underlying mass is not excluded. 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 Electronically Signed   By: Marybelle Killings M.D.   On: 04/09/2016 11:30   Dg Chest Portable 1 View  04/09/2016  CLINICAL DATA:  Patient reports intermittent shortness of breath x1 week. Per patient progressively worse this morning at 4 am to point he could not lay down. Hx of HTN, heart murmur, diabetes. Ex-smoker. EXAM: PORTABLE CHEST 1 VIEW COMPARISON:  03/16/2016 FINDINGS: Extensive opacification over the lower 2/3 of the left lung consistent with large effusion and underlying consolidation. Shift of the heart mediastinal contents toward the right. Heart size difficult to assess therefore. Right lung appears clear. IMPRESSION: Extensive abnormal opacity left thorax consistent with large effusion and underlying consolidation. Electronically Signed   By: Skipper Cliche M.D.   On: 04/09/2016 09:30    Medications:  Prior to Admission:  Prescriptions prior to admission  Medication Sig Dispense Refill Last Dose  . amLODipine (NORVASC) 10 MG tablet Take 1 tablet (10 mg total) by mouth every evening. 90 tablet 2 04/08/2016 at Unknown time  . benazepril (LOTENSIN) 40 MG tablet TAKE ONE TABLET BY MOUTH ONCE DAILY 90 tablet 2 04/08/2016 at Unknown time  . febuxostat (ULORIC) 40 MG tablet Take 2  tablets (80 mg total) by mouth daily. 60 tablet 3 04/08/2016 at Unknown time  . finasteride (PROSCAR) 5 MG tablet Take 5 mg by mouth daily.   04/08/2016 at Unknown time  . fluticasone (FLONASE) 50 MCG/ACT nasal spray Place 1 spray into both nostrils 2 (two) times daily as needed for allergies or rhinitis. 16 g 6 04/08/2016 at Unknown time  . furosemide (LASIX) 40 MG tablet Take 1 tablet (40 mg total) by mouth daily as  needed for fluid. 30 tablet 1 04/08/2016 at Unknown time  . hydrALAZINE (APRESOLINE) 50 MG tablet Take 1 tablet (50 mg total) by mouth 3 (three) times daily. 90 tablet 1 04/08/2016 at Unknown time  . levocetirizine (XYZAL) 5 MG tablet Take 1 tablet (5 mg total) by mouth every evening. 30 tablet 1 04/08/2016 at Unknown time  . metFORMIN (GLUCOPHAGE) 500 MG tablet TAKE ONE TABLET BY MOUTH TWICE DAILY WITH A MEAL 180 tablet 1 04/08/2016 at Unknown time  . metoprolol succinate (TOPROL-XL) 100 MG 24 hr tablet Take 1 tablet (100 mg total) by mouth 1 day or 1 dose. (Patient taking differently: Take 100 mg by mouth daily. ) 90 tablet 2 04/08/2016 at Unknown time  . niacin (NIASPAN) 1000 MG CR tablet Take 1 tablet (1,000 mg total) by mouth at bedtime. 30 tablet 3 04/08/2016 at Unknown time  . omeprazole (PRILOSEC) 20 MG capsule Take 1 capsule (20 mg total) by mouth daily. 30 capsule 3 04/08/2016 at Unknown time  . pravastatin (PRAVACHOL) 40 MG tablet Take 40 mg by mouth daily.   04/08/2016 at Unknown time  . warfarin (COUMADIN) 1 MG tablet Take 1 tablet (1 mg total) by mouth daily. 30 tablet 1 04/08/2016 at Unknown time  . warfarin (COUMADIN) 5 MG tablet TAKE ONE TABLET BY MOUTH ONCE DAILY AT  6  PM 30 tablet 5 04/08/2016 at Unknown time   Scheduled: . amLODipine  10 mg Oral QPM  . benazepril  40 mg Oral Daily  . docusate sodium  100 mg Oral BID  . febuxostat  80 mg Oral Daily  . finasteride  5 mg Oral Daily  . furosemide  20 mg Intravenous Q12H  . hydrALAZINE  50 mg Oral TID  . insulin aspart  0-5  Units Subcutaneous QHS  . insulin aspart  0-9 Units Subcutaneous TID WC  . loratadine  10 mg Oral q1800  . metoprolol succinate  100 mg Oral Daily  . niacin  1,000 mg Oral QHS  . pantoprazole  40 mg Oral Daily  . pravastatin  40 mg Oral Daily  . sodium chloride flush  3 mL Intravenous Q12H  . Warfarin - Pharmacist Dosing Inpatient   Does not apply q1800   Continuous: . 0.9 % NaCl with KCl 20 mEq / L 40 mL/hr at 04/09/16 1505   [id number] chloride, acetaminophen **OR** acetaminophen, albuterol, fluticasone, HYDROmorphone (DILAUDID) injection, ondansetron **OR** ondansetron (ZOFRAN) IV, oxyCODONE, sodium chloride flush  Assesment: He has a large left pleural effusion. He has other abnormalities on CT. He is chronically anticoagulated. He may require bronchoscopy so I would prefer him to be left off his chronic Coumadin therapy for now and switch to something like Lovenox so that if he has to have bronchoscopy we can do that. 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

## 2016-04-10 NOTE — Progress Notes (Signed)
PROGRESS NOTE    Alan [name].  [id number] DOB: 03/08/1941 DOA: 04/09/2016 PCP: Fransisca Kaufmann Dettinger, MD  Outpatient Specialists :[facility]; General surgery-Dr. Arnoldo Morale     Brief Narrative:  Alan [name]. is a 75 y.o. 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  In the ED, his chest x-ray revealed extensive abnormal opacity in the left thorax consistent with large effusion and underlying consolidation. Contrasted CT of the chest revealed large left pleural effusion with compressive atelectasis of the left lung; irregularity of the left lower lobe bronchus; small right pleural effusion and compressive atelectasis of the right lower lobe; peripancreatic fluid density has improved; etc. Lab data revealed a serum potassium was 3.3, lipase of 170, normal LFTs, hemoglobin 11.8, BNP is 76, troponin I of 0.03,. INR of 1.63, glucose of 112. EDP Dr. Sabra Heck performed a left thoracentesis yielding 850 cc sent for studies. Patient was admitted for evaluation of large left pleural effusion and ongoing pancreatic changes.  Assessment & Plan:   Principal Problem:   Pleural effusion, left Active Problems:   Abnormal CT scan, pancreas or bile duct   BPH (benign prostatic hyperplasia)   Embolism and thrombosis of splenic artery   Elevated lipase   Essential hypertension   Type 2 diabetes mellitus without complications (HCC)   1. Large left pleural effusion with associated left lung compressive atelectasis and irregular left lower lobe bronchus. The etiology of this finding is unclear and unknown at this time. The findings are concerning for a possible underlying malignancy. However, inflammatory changes from the necrotizing  pancreatitis 6 months ago may have caused chronic or subacute pleural effusion. Per his history, he stop smoking decades ago. -Patient is status post thoracentesis in the ED, yielding 850 cc of fluid. He is surprisingly he is not hypoxic and is less symptomatic. He is not febrile nor does he have a leukocytosis. So, scheduled nebs and antibiotics were not started. -Studies sent, including for cytology. Pleural fluid results noted for 532 WBC's of which 90% are monocytes/macrophages; protein of only 4.4, amylase of 1447, LDH of 390. Culture and cytology pending. -Pulmonologist, Dr. Luan Pulling consulted. His recommendations noted and appreciated. He may perform a bronchoscopy, pending the results of the fluid analysis. -Will stop Coumadin and start heparin in anticipation of bronchoscopy.  Peripheral edema. Patient is noted to have 2+ bilateral lower extremity edema. He takes Lasix when necessary for swelling. His BNP was within normal limits on admission. Lasix was given IV every 12 hours 2. Peripheral edema has nearly resolved. -We will change Lasix back to by mouth. -Echocardiogram ordered. further evaluation and management. TSH was within normal limits.  Abnormal pancreas. Patient has a history of biliary pancreatitis and necrotizing pancreatitis in October/November 2016. He underwent cholecystectomy by Dr. Arnoldo Morale 10/2015. He was also followed by [facility]. CT on admission revealed improvement in the peripancreatic fluid density, but there continue to be fluid density surrounding the porta hepatis; pancreatic body and tail atrophic, and poor enhancement of the pancreatic head worrisome for necrosis; severe narrowing of the portal vein adjacent to the suspected pancreatic necrosis; mass in the pancreatic head not excluded; development of gastroesophageal varices. -Patient's lipase was modestly elevated at 170, but he has no abdominal tenderness and no complaints of abdominal pain,  nausea, and vomiting.  Lipase improved to 128. Amylase was elevated to 32. Elevated amylase noted in the pleural fluid. -Abdominal ultrasound ordered for further evaluation. We'll consult gastroenterology to see if further imaging with MR or CT is needed.  Embolism/thrombosis of the splenic vein. Apparently this was diagnosed during the hospitalization for biliary pancreatitis. At that time, he was started on Coumadin. -His INR was slightly sub- therapeutic on admission. Pharmacy consulted for dosing. -We'll hold Coumadin and start heparin drip per pharmacy in anticipation of possible bronchoscopy.  Hypertension. Patient is treated chronically with amlodipine, benazepril, hydralazine, and Toprol-XL. These medications were continued. His blood pressure is stable.  Diabetes mellitus, type II. Patient is treated chronically with metformin. This will be held in light of the IV contrast given for the CT of the abdomen. - Treatment started with with sliding scale NovoLog. Hemoglobin A1c pending.   DVT prophylaxis:coumadin>>heparin gtt Code Status: Full code Family Communication: Discussed with wife and son on 04/09/16 Disposition Plan: Discharge to home when clinically appropriate.   Consultants:   Gastroenterology pending  Pulmonology  Procedures:   Possible bronchoscopy pending  2-D echocardiogram:  Antimicrobials:   None    Subjective: Patient denies shortness of breath at rest. He slept well last night.  Objective: Filed Vitals:   04/10/16 0600 04/10/16 0700 04/10/16 0753 04/10/16 0754  BP: 125/64 131/69    Pulse: 67 66  78  Temp:   98.8 F (37.1 C)   TempSrc:   Oral   Resp: 25 22    Height:      Weight:      SpO2: 96% 95%      Intake/Output Summary (Last 24 hours) at 04/10/16 0850 Last data filed at 04/10/16 0300  Gross per 24 hour  Intake 1016.67 ml  Output   2500 ml  Net -1483.33 ml   Filed Weights   04/09/16 0852 04/09/16 1331  Weight: 76.204 kg (168  lb) 69.9 kg (154 lb 1.6 oz)    Examination:  General exam: Appears calm and comfortable; No acute distress.  Respiratory system: Globally decreased breath sounds on the left greater than right with occasional crackles. Respiratory effort normal. Cardiovascular system: S1 & S2 heard, no murmurs rubs or gallops. Neuro resolution of bilateral lower extremity edema. Gastrointestinal system: Abdomen is nondistended, soft and nontender. No organomegaly or masses felt. Normal bowel sounds heard. Central nervous system: Alert and oriented. No focal neurological deficits. Extremities: Symmetric 5 x 5 power. Skin: No rashes, lesions or ulcers Psychiatry: Judgement and insight appear normal. Mood & affect appropriate.     Data Reviewed: I have personally reviewed following labs and imaging studies  CBC:  Recent Labs Lab 04/09/16 0945 04/10/16 0412  WBC 7.9 8.9  NEUTROABS 5.2  --   HGB 11.8* 12.2*  HCT 35.8* 37.7*  MCV 87.3 87.5  PLT 415* [id number]*   Basic Metabolic Panel:  Recent Labs Lab 04/09/16 0945 04/10/16 0412  NA 142 142  K 3.3* 3.7  CL 104 104  CO2 27 28  GLUCOSE 112* 101*  BUN 23* 18  CREATININE 1.08 1.05  CALCIUM 8.8* 8.8*  MG  --  1.7   GFR: Estimated Creatinine Clearance: 54.5 mL/min (by C-G formula based on Cr of 1.05). Liver Function Tests:  Recent Labs Lab 04/09/16 0945 04/10/16 0412  AST 21 22  ALT 17 17  ALKPHOS 81 81  BILITOT 0.9 1.4*  PROT 6.5 6.2*  ALBUMIN 2.9* 2.8*    Recent Labs Lab 04/09/16 0945 04/10/16 [id number]  LIPASE 170* 128*  AMYLASE  --  232*   No results for input(s): AMMONIA in the last 168 hours. Coagulation Profile:  Recent Labs Lab 04/09/16 0945 04/10/16 0412  INR 1.63* 1.90*   Cardiac Enzymes:  Recent Labs Lab 04/09/16 0945 04/10/16 0412  TROPONINI 0.03 0.03   BNP (last 3 results) No results for input(s): PROBNP in the last 8760 hours. HbA1C: No results for input(s): HGBA1C in the last 72 hours. CBG:  Recent  Labs Lab 04/09/16 1643 04/09/16 2105 04/10/16 0727  GLUCAP 80 93 101*   Lipid Profile: No results for input(s): CHOL, HDL, LDLCALC, TRIG, CHOLHDL, LDLDIRECT in the last 72 hours. Thyroid Function Tests:  Recent Labs  04/10/16 0412  TSH 1.842   Anemia Panel: No results for input(s): VITAMINB12, FOLATE, FERRITIN, TIBC, IRON, RETICCTPCT in the last 72 hours. Urine analysis: No results found for: COLORURINE, APPEARANCEUR, LABSPEC, PHURINE, GLUCOSEU, HGBUR, BILIRUBINUR, KETONESUR, PROTEINUR, UROBILINOGEN, NITRITE, LEUKOCYTESUR Sepsis Labs: @LABRCNTIP (procalcitonin:4,lacticidven:4)  ) Recent Results (from the past 240 hour(s))  Culture, body fluid-bottle     Status: None (Preliminary result)   Collection Time: 04/09/16 12:59 PM  Result Value Ref Range Status   Specimen Description PLEURAL  Final   Special Requests BOTTLES DRAWN AEROBIC AND ANAEROBIC 5CC EACH  Final   Culture PENDING  Incomplete   Report Status PENDING  Incomplete  MRSA PCR Screening     Status: None   Collection Time: 04/09/16  1:44 PM  Result Value Ref Range Status   MRSA by PCR NEGATIVE NEGATIVE Final    Comment:        The GeneXpert MRSA Assay (FDA approved for NASAL specimens only), is one component of a comprehensive MRSA colonization surveillance program. It is not intended to diagnose MRSA infection nor to guide or monitor treatment for MRSA infections.          Radiology Studies: Dg Chest 2 View  04/09/2016  CLINICAL DATA:  Thoracentesis EXAM: CHEST  2 VIEW COMPARISON:  900 hours today FINDINGS: Left pleural effusion has improved. There is no left pneumothorax. Small right pleural effusion and basilar atelectasis. Normal heart size. IMPRESSION: No pneumothorax post left thoracentesis. Electronically Signed   By: Marybelle Killings M.D.   On: 04/09/2016 13:55   Ct Chest W Contrast  04/09/2016  CLINICAL DATA:  Short of breath for 3 weeks.  Pancreatitis. EXAM: CT CHEST WITH CONTRAST TECHNIQUE:  Multidetector CT imaging of the chest was performed during intravenous contrast administration. CONTRAST:  80mL ISOVUE-300 IOPAMIDOL (ISOVUE-300) INJECTION 61% COMPARISON:  CT abdomen 10/11/2015 FINDINGS: Large left pleural effusion is present. There is compressive atelectasis of much of the left lung. On images 78 through 84, the left lower lobe bronchus is irregular and somewhat narrowed. Malignancy surrounding the left lower lobe bronchus cannot be excluded. There is no discrete mass identified by CT. There is a small right pleural effusion with associated dependent atelectasis. There is a rounded filling defect in the right bronchus intermedius on image 78 which may simply represent mucous material. Small mediastinal nodes. Minimal left main, LAD territory, and circumflex coronary artery calcifications. No pneumothorax. The upper abdomen remains abnormal. Peripancreatic fluid density has improved. There continues to be fluid density surrounding the porta hepatis. The pancreatic body and tail are atrophic. There continues to be poor enhancement of the pancreatic head worrisome for necrosis. There is noted to be severe narrowing of the portal vein adjacent to the area of suspected pancreatic necrosis. Underlying mass in the pancreatic head cannot  be excluded. 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 IMPRESSION: Large left pleural effusion. There is compressive atelectasis of much of the left lung. There is irregularity of the left lower lobe bronchus. Underlying bronchogenic lesion cannot be excluded. Small right pleural effusion and compressive atelectasis of the right lower lobe. Rounded density in the right bronchus intermedius may simply represent mucous material Findings related to acute complicated pancreatitis in the abdomen have improved. The continues to be fluid density about the pancreas and porta hepatis. There continues to be low density in the head of the pancreas compressing the portal vein. This  may represent necrosis but underlying mass is not excluded. 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 Electronically Signed   By: Marybelle Killings M.D.   On: 04/09/2016 11:30   Dg Chest Portable 1 View  04/09/2016  CLINICAL DATA:  Patient reports intermittent shortness of breath x1 week. Per patient progressively worse this morning at 4 am to point he could not lay down. Hx of HTN, heart murmur, diabetes. Ex-smoker. EXAM: PORTABLE CHEST 1 VIEW COMPARISON:  03/16/2016 FINDINGS: Extensive opacification over the lower 2/3 of the left lung consistent with large effusion and underlying consolidation. Shift of the heart mediastinal contents toward the right. Heart size difficult to assess therefore. Right lung appears clear. IMPRESSION: Extensive abnormal opacity left thorax consistent with large effusion and underlying consolidation. Electronically Signed   By: Skipper Cliche M.D.   On: 04/09/2016 09:30        Scheduled Meds: . amLODipine  10 mg Oral QPM  . benazepril  40 mg Oral Daily  . docusate sodium  100 mg Oral BID  . febuxostat  80 mg Oral Daily  . finasteride  5 mg Oral Daily  . furosemide  20 mg Intravenous Q12H  . hydrALAZINE  50 mg Oral TID  . insulin aspart  0-5 Units Subcutaneous QHS  . insulin aspart  0-9 Units Subcutaneous TID WC  . loratadine  10 mg Oral q1800  . metoprolol succinate  100 mg Oral Daily  . niacin  1,000 mg Oral QHS  . pantoprazole  40 mg Oral Daily  . pravastatin  40 mg Oral Daily  . sodium chloride flush  3 mL Intravenous Q12H  . Warfarin - Pharmacist Dosing Inpatient   Does not apply q1800   Continuous Infusions: . 0.9 % NaCl with KCl 20 mEq / L 40 mL/hr at 04/09/16 1505     LOS: 1 day    Time spent: 35 minutes.    Rexene Alberts, MD [facility] Pager [phone]  If 7PM-7AM, please contact night-coverage [email or web address] Password [email or web address] 04/10/2016, 8:50 AM

## 2016-04-10 NOTE — Progress Notes (Signed)
ANTICOAGULATION CONSULT NOTE  Pharmacy Consult for Heparin Indication: Embolism and thrombosis of splenic artery  Allergies  Allergen Reactions  . Allopurinol Other (See Comments)    Significantly decreased WBC's  . Colchicine Other (See Comments)    Significantly decreased WBC's    Patient Measurements: Height: 5\' 5"  (165.1 cm) Weight: 154 lb 1.6 oz (69.9 kg) IBW/kg (Calculated) : 61.5 HEPARIN DW (KG): 69.9   Vital Signs: Temp: 98.7 F (37.1 C) (05/01 2111) Temp Source: Oral (05/01 2111) BP: 171/75 mmHg (05/01 2111) Pulse Rate: 76 (05/01 2111)  Labs:  Recent Labs  04/09/16 0945 04/10/16 0412 04/10/16 1821  HGB 11.8* 12.2*  --   HCT 35.8* 37.7*  --   PLT 415* 404*  --   LABPROT 19.4* 21.7*  --   INR 1.63* 1.90*  --   HEPARINUNFRC  --   --  0.26*  CREATININE 1.08 1.05  --   TROPONINI 0.03 0.03  --     Estimated Creatinine Clearance: 54.5 mL/min (by C-G formula based on Cr of 1.05).   Medical History: Past Medical History  Diagnosis Date  . Hypertension   . Heart murmur   . ED (erectile dysfunction)   . Agranulocytosis ([place])   . Diabetes mellitus   . Hyperlipidemia   . Gout   . Adenomatous colon polyp   . Prostate cancer ([place])     Followed by Dr. Jeffie Pollock   . Pancreatitis   . Gallstones   . Allergy   . Internal hemorrhoids   . Diverticulosis   . 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    Medications:  Prescriptions prior to admission  Medication Sig Dispense Refill Last Dose  . amLODipine (NORVASC) 10 MG tablet Take 1 tablet (10 mg total) by mouth every evening. 90 tablet 2 04/08/2016 at Unknown time  . benazepril (LOTENSIN) 40 MG tablet TAKE ONE TABLET BY MOUTH ONCE DAILY 90 tablet 2 04/08/2016 at Unknown time  . febuxostat (ULORIC) 40 MG tablet Take 2 tablets (80 mg total) by mouth daily. 60 tablet 3 04/08/2016 at Unknown time  . finasteride (PROSCAR) 5 MG tablet Take 5 mg  by mouth daily.   04/08/2016 at Unknown time  . fluticasone (FLONASE) 50 MCG/ACT nasal spray Place 1 spray into both nostrils 2 (two) times daily as needed for allergies or rhinitis. 16 g 6 04/08/2016 at Unknown time  . furosemide (LASIX) 40 MG tablet Take 1 tablet (40 mg total) by mouth daily as needed for fluid. 30 tablet 1 04/08/2016 at Unknown time  . hydrALAZINE (APRESOLINE) 50 MG tablet Take 1 tablet (50 mg total) by mouth 3 (three) times daily. 90 tablet 1 04/08/2016 at Unknown time  . levocetirizine (XYZAL) 5 MG tablet Take 1 tablet (5 mg total) by mouth every evening. 30 tablet 1 04/08/2016 at Unknown time  . metFORMIN (GLUCOPHAGE) 500 MG tablet TAKE ONE TABLET BY MOUTH TWICE DAILY WITH A MEAL 180 tablet 1 04/08/2016 at Unknown time  . metoprolol succinate (TOPROL-XL) 100 MG 24 hr tablet Take 1 tablet (100 mg total) by mouth 1 day or 1 dose. (Patient taking differently: Take 100 mg by mouth daily. ) 90 tablet 2 04/08/2016 at Unknown time  . niacin (NIASPAN) 1000 MG CR tablet Take 1 tablet (1,000 mg total) by mouth at bedtime. 30 tablet 3 04/08/2016 at Unknown time  . omeprazole (PRILOSEC) 20 MG capsule Take 1 capsule (20 mg total) by mouth  daily. 30 capsule 3 04/08/2016 at Unknown time  . pravastatin (PRAVACHOL) 40 MG tablet Take 40 mg by mouth daily.   04/08/2016 at Unknown time  . warfarin (COUMADIN) 1 MG tablet Take 1 tablet (1 mg total) by mouth daily. 30 tablet 1 04/08/2016 at Unknown time  . warfarin (COUMADIN) 5 MG tablet TAKE ONE TABLET BY MOUTH ONCE DAILY AT  6  PM 30 tablet 5 04/08/2016 at Unknown time    Assessment: 75 yo male with h/o thrombosis of splenic artery 10/2015 which he is on Coumadin. Coumadin on hold and anticoagulating with Heparin in anticipation of possible bronchoscopy. INR 1.9 today. Heparin level slightly below goal.  Goal of Therapy:  Heparin level 0.3-0.7 units/ml Monitor platelets by anticoagulation protocol: Yes   Plan:  Hold Coumadin until brochoscopy  done Increase heparin infusion at 1200 units/hr Check anti-Xa level Daily while on heparin Continue to monitor H&H and platelets  Pricilla Larsson, [facility]   04/10/2016,9:57 PM

## 2016-04-11 ENCOUNTER — Telehealth: Payer: Self-pay | Admitting: Gastroenterology

## 2016-04-11 DIAGNOSIS — J90 Pleural effusion, not elsewhere classified: Secondary | ICD-10-CM | POA: Insufficient documentation

## 2016-04-11 LAB — CBC
HCT: 35.9 % — ABNORMAL LOW (ref 39.0–52.0)
Hemoglobin: 11.8 g/dL — ABNORMAL LOW (ref 13.0–17.0)
MCH: 28.6 pg (ref 26.0–34.0)
MCHC: 32.9 g/dL (ref 30.0–36.0)
MCV: 87.1 fL (ref 78.0–100.0)
PLATELETS: 389 10*3/uL (ref 150–400)
RBC: 4.12 MIL/uL — AB (ref 4.22–5.81)
RDW: 16.2 % — ABNORMAL HIGH (ref 11.5–15.5)
WBC: 8.5 10*3/uL (ref 4.0–10.5)

## 2016-04-11 LAB — PH, BODY FLUID: PH, BODY FLUID: 8.1

## 2016-04-11 LAB — BASIC METABOLIC PANEL
ANION GAP: 6 (ref 5–15)
BUN: 13 mg/dL (ref 6–20)
CALCIUM: 8.6 mg/dL — AB (ref 8.9–10.3)
CO2: 27 mmol/L (ref 22–32)
Chloride: 104 mmol/L (ref 101–111)
Creatinine, Ser: 1 mg/dL (ref 0.61–1.24)
Glucose, Bld: 117 mg/dL — ABNORMAL HIGH (ref 65–99)
POTASSIUM: 3.7 mmol/L (ref 3.5–5.1)
SODIUM: 137 mmol/L (ref 135–145)

## 2016-04-11 LAB — HEMOGLOBIN A1C
HEMOGLOBIN A1C: 5.7 % — AB (ref 4.8–5.6)
Mean Plasma Glucose: 117 mg/dL

## 2016-04-11 LAB — GLUCOSE, CAPILLARY
GLUCOSE-CAPILLARY: 116 mg/dL — AB (ref 65–99)
GLUCOSE-CAPILLARY: 115 mg/dL — AB (ref 65–99)
Glucose-Capillary: 131 mg/dL — ABNORMAL HIGH (ref 65–99)
Glucose-Capillary: 114 mg/dL — ABNORMAL HIGH (ref 65–99)

## 2016-04-11 LAB — PROTIME-INR
INR: 2.36 — AB (ref 0.00–1.49)
PROTHROMBIN TIME: 25.6 s — AB (ref 11.6–15.2)

## 2016-04-11 LAB — HEPARIN LEVEL (UNFRACTIONATED): HEPARIN UNFRACTIONATED: 0.25 [IU]/mL — AB (ref 0.30–0.70)

## 2016-04-11 NOTE — Consult Note (Signed)
   [facility] CM Inpatient Consult   04/11/2016  Alan [name]. 02/25/1941 [id number]   Spoke with patient at bedside regarding [facility] services. Patient states he lives with his wife and granddaughter. He would like to review our brochure and speak with wife, who will be at the hospital this evening. Patient given [facility] brochure and contact information for future reference.  Of note, [facility] Care Management services would not replace or interfere with any services that are arranged by inpatient case management or social work. For additional questions or referrals please contact:  Royetta Crochet. Laymond Purser, RN, BSN, [facility] Liaison [phone]

## 2016-04-11 NOTE — Progress Notes (Signed)
ANTICOAGULATION CONSULT NOTE  Pharmacy Consult for Heparin Indication: Embolism and thrombosis of splenic artery  Allergies  Allergen Reactions  . Allopurinol Other (See Comments)    Significantly decreased WBC's  . Colchicine Other (See Comments)    Significantly decreased WBC's    Patient Measurements: Height: 5\' 5"  (165.1 cm) Weight: 150 lb 11.2 oz (68.357 kg) IBW/kg (Calculated) : 61.5 HEPARIN DW (KG): 69.9   Vital Signs: Temp: 98.4 F (36.9 C) (05/02 0628) Temp Source: Oral (05/02 0628) BP: 134/73 mmHg (05/02 0628) Pulse Rate: 68 (05/02 0628)  Labs:  Recent Labs  04/09/16 0945 04/10/16 0412 04/10/16 1821 04/11/16 0428  HGB 11.8* 12.2*  --  11.8*  HCT 35.8* 37.7*  --  35.9*  PLT 415* 404*  --  389  LABPROT 19.4* 21.7*  --  25.6*  INR 1.63* 1.90*  --  2.36*  HEPARINUNFRC  --   --  0.26* 0.25*  CREATININE 1.08 1.05  --  1.00  TROPONINI 0.03 0.03  --   --     Estimated Creatinine Clearance: 57.2 mL/min (by C-G formula based on Cr of 1).   Medical History: Past Medical History  Diagnosis Date  . Hypertension   . Heart murmur   . ED (erectile dysfunction)   . Agranulocytosis ([place])   . Diabetes mellitus   . Hyperlipidemia   . Gout   . Adenomatous colon polyp   . Prostate cancer ([place])     Followed by Dr. Jeffie Pollock   . Pancreatitis   . Gallstones   . Allergy   . Internal hemorrhoids   . Diverticulosis   . 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    Medications:  Prescriptions prior to admission  Medication Sig Dispense Refill Last Dose  . amLODipine (NORVASC) 10 MG tablet Take 1 tablet (10 mg total) by mouth every evening. 90 tablet 2 04/08/2016 at Unknown time  . benazepril (LOTENSIN) 40 MG tablet TAKE ONE TABLET BY MOUTH ONCE DAILY 90 tablet 2 04/08/2016 at Unknown time  . febuxostat (ULORIC) 40 MG tablet Take 2 tablets (80 mg total) by mouth daily. 60 tablet 3 04/08/2016 at  Unknown time  . finasteride (PROSCAR) 5 MG tablet Take 5 mg by mouth daily.   04/08/2016 at Unknown time  . fluticasone (FLONASE) 50 MCG/ACT nasal spray Place 1 spray into both nostrils 2 (two) times daily as needed for allergies or rhinitis. 16 g 6 04/08/2016 at Unknown time  . furosemide (LASIX) 40 MG tablet Take 1 tablet (40 mg total) by mouth daily as needed for fluid. 30 tablet 1 04/08/2016 at Unknown time  . hydrALAZINE (APRESOLINE) 50 MG tablet Take 1 tablet (50 mg total) by mouth 3 (three) times daily. 90 tablet 1 04/08/2016 at Unknown time  . levocetirizine (XYZAL) 5 MG tablet Take 1 tablet (5 mg total) by mouth every evening. 30 tablet 1 04/08/2016 at Unknown time  . metFORMIN (GLUCOPHAGE) 500 MG tablet TAKE ONE TABLET BY MOUTH TWICE DAILY WITH A MEAL 180 tablet 1 04/08/2016 at Unknown time  . metoprolol succinate (TOPROL-XL) 100 MG 24 hr tablet Take 1 tablet (100 mg total) by mouth 1 day or 1 dose. (Patient taking differently: Take 100 mg by mouth daily. ) 90 tablet 2 04/08/2016 at Unknown time  . niacin (NIASPAN) 1000 MG CR tablet Take 1 tablet (1,000 mg total) by mouth at bedtime. 30 tablet 3 04/08/2016 at Unknown time  . omeprazole (  PRILOSEC) 20 MG capsule Take 1 capsule (20 mg total) by mouth daily. 30 capsule 3 04/08/2016 at Unknown time  . pravastatin (PRAVACHOL) 40 MG tablet Take 40 mg by mouth daily.   04/08/2016 at Unknown time  . warfarin (COUMADIN) 1 MG tablet Take 1 tablet (1 mg total) by mouth daily. 30 tablet 1 04/08/2016 at Unknown time  . warfarin (COUMADIN) 5 MG tablet TAKE ONE TABLET BY MOUTH ONCE DAILY AT  6  PM 30 tablet 5 04/08/2016 at Unknown time    Assessment: 75 yo male with h/o thrombosis of splenic artery 10/2015 which he is on Coumadin. Coumadin on hold and anticoagulating with Heparin in anticipation of possible bronchoscopy. INR 2.36 today. Heparin level still slightly below goal.  Goal of Therapy:  Heparin level 0.3-0.7 units/ml Monitor platelets by anticoagulation  protocol: Yes   Plan:  Heparin level in 8 hours Hold Coumadin until brochoscopy done Increase heparin infusion at 1300 units/hr Check anti-Xa level Daily while on heparin Continue to monitor H&H and platelets  Isac Sarna, BS [place], BCPS Clinical Pharmacist Pager [phone] 04/11/2016,8:26 AM

## 2016-04-11 NOTE — Telephone Encounter (Signed)
appt made and floor nurse will let patient know

## 2016-04-11 NOTE — Progress Notes (Signed)
He says he feels well. He has no complaints. His breathing is okay. The pleural fluid shows high amylase level which makes it more likely that his pancreatic in nature rather than parapneumonic or related to a primary lung cancer. I would like to see him have another thoracentesis if we can get his prothrombin time at a safe range. At that point after he has more fluid removed repeat CT to see if he still shows irregularity of the bronchus. If he does he'll need to have bronchoscopy which we can probably do as an outpatient since he is doing better. If he does not show the irregularity of the bronchus then I don't think he would need bronchoscopy or further evaluation but needs to have his pancreas further evaluated as is being arranged
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
## 2016-04-11 NOTE — Telephone Encounter (Signed)
Please arrange follow-up with me in about 4 weeks (hospital follow-up). I will arrange CT with pancreatic protocol at that time. Thanks!
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
## 2016-04-12 ENCOUNTER — Inpatient Hospital Stay (HOSPITAL_COMMUNITY): Payer: Medicare Other

## 2016-04-12 ENCOUNTER — Ambulatory Visit: Payer: Medicare Other | Admitting: Family Medicine

## 2016-04-12 ENCOUNTER — Encounter (HOSPITAL_COMMUNITY): Payer: Self-pay

## 2016-04-12 LAB — CBC
HCT: 36.6 % — ABNORMAL LOW (ref 39.0–52.0)
Hemoglobin: 12 g/dL — ABNORMAL LOW (ref 13.0–17.0)
MCH: 28.4 pg (ref 26.0–34.0)
MCHC: 32.8 g/dL (ref 30.0–36.0)
MCV: 86.7 fL (ref 78.0–100.0)
PLATELETS: 413 10*3/uL — AB (ref 150–400)
RBC: 4.22 MIL/uL (ref 4.22–5.81)
RDW: 16.3 % — AB (ref 11.5–15.5)
WBC: 11 10*3/uL — AB (ref 4.0–10.5)

## 2016-04-12 LAB — GLUCOSE, CAPILLARY
GLUCOSE-CAPILLARY: 107 mg/dL — AB (ref 65–99)
GLUCOSE-CAPILLARY: 121 mg/dL — AB (ref 65–99)
Glucose-Capillary: 105 mg/dL — ABNORMAL HIGH (ref 65–99)

## 2016-04-12 LAB — PROTIME-INR
INR: 1.72 — AB (ref 0.00–1.49)
Prothrombin Time: 20.1 seconds — ABNORMAL HIGH (ref 11.6–15.2)

## 2016-04-12 NOTE — Care Management Important Message (Signed)
Important Message  Patient Details  Name: Alan [name]. MRN: [id number] Date of Birth: 1941-11-30   Medicare Important Message Given:  Yes    Alvie Heidelberg, RN 04/12/2016, 1:42 PM

## 2016-04-12 NOTE — Progress Notes (Signed)
He is scheduled for thoracentesis, therapeutic and then CT. This should help [place] to decide if he really needs any further pulmonary procedures or not. Cytology on the initial thoracentesis is negative

## 2016-04-12 NOTE — Care Management Note (Signed)
Case Management Note  Patient Details  Name: Alan [name]. MRN: [id number] Date of Birth: 1941-09-20  Subjective/Objective: To room for discharge planning. Patient out of room for Xray. Family present in room. Patient spouse stated that the patient is independent at home and does not use and assistive devices to ambulate. Spouse stated the the patient still drives.  No issues with medications except that patient occasionally goes into the "donut Hole". No CM needs anticipated. Continue to follow.                     Action/Plan: Home with self care.   Expected Discharge Date:                  Expected Discharge Plan:  Home/Self Care  In-House Referral:     Discharge planning Services  CM Consult  Post Acute Care Choice:    Choice offered to:     DME Arranged:    DME Agency:  [place]:    [place] Agency:  [place]  Status of Service:  Completed, signed off  Medicare Important Message Given:    Date Medicare IM Given:    Medicare IM give by:    Date Additional Medicare IM Given:    Additional Medicare Important Message give by:     If discussed at [place] of Stay Meetings, dates discussed:    Additional Comments:  Alvie Heidelberg, RN 04/12/2016, 12:07 PM

## 2016-04-12 NOTE — Progress Notes (Signed)
PROGRESS NOTE    Alan [name].  [id number] DOB: [birth year] DOA: 04/09/2016 PCP: Fransisca Kaufmann Dettinger, MD Outpatient Specialists:    Brief Narrative:  55 yom with a hx of acute necrotizing biliary pancreatitis in 2016 s/p cholecystectomy, prostate CA, HTN, and DM type 2, presented to the ED with complaints of SOB. CXR showed extensive abnormal opacity in the left thorax consistent with a large effusion and underlying consolidation. Contrasted CT showed large left pleural effusion with compressive atelectasis of the left lung, a small right pleural effusion. Dr. Sabra Heck performed a left thoracentesis which yielded 850cc which was sent for studies. He was referred for evaluation of pleural effusion and ongoing pancreatic changes. Pulmonology recommends another thoracentesis; and Dr. Thornton Papas of radiology planning for therapeutic thoracentesis today. Will need pt's INR to decrease. Will have follow up CT done following thoracentesis.    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 Large left pleural effusion with associated left lung compressive atelectasis and irregular left lower lobe bronchus. Patient has had 2 thoracentesis during his hospital stay. Initially done in the ER, thoracentesis revealed 850 mL of fluid. Second thoracentesis was done on 5/3 with removal of 1.6 L of fluid. Cytology of fluid thus far has been negative. Follow-up CT scan of the chest indicated persistent left bronchus abnormalities. Dr. Georgiann Cocker has been following, and may perform bronchoscopy, but likely will be done as an outpatient. Await any further recommendations. 2. Peripheral edema, possibly secondary to chronic diastolic  heart failure. Initially patient had 2+ lower extremity edema. He was started on IV Lasix with improvement. BNP wnl. He has since been transitioned to oral Lasix. ECHO showed EF 60-65% with grade 1 diastolic dysfunction. 3. Abnormal pancreas. S/p cholecystectomy. Lipase modestly elevated but has improved. Amylase was also noted in pleural fluid. GI has been consulted, input appreciated. CT of chest done on admission indicated peripancreatic fluid density and poor enhancement of the pancreatic head worrisome for necrosis. Lipase is only modestly elevated. Clinically, the patient was not complaining of any abdominal pain or vomiting. GI recommendations are to repeat CT scan in 4 weeks. 4. Embolism/ thrombosis of splenic vein. INR was slightly sub-therapeutic on admission. Patient was started on heparin infusion. INR was therapeutic on 5/2 and heparin was discontinued. We'll continue Coumadin per pharmacy. 5. Hypertension. Patient is on multiple antihypertensives. Blood pressure is currently stable. 6. Diabetes. Continue to follow with sliding scale insulin. Restart metformin on discharge.   DVT prophylaxis:coumadin>>heparin gtt Code Status: Full code Family Communication: Discussed patient Disposition Plan: Discharge to home when clinically appropriate.   Consultants:   Pulmonology, Dr. Luan Pulling  Gastroenterology  Procedures:  Thoracentesis on 4/30 with removal of 850 mL of fluid Thoracentesis on 5/3 with removal of 1.6 L of fluid -2-D echocardiogram:Study Conclusions - Left ventricle: The cavity size was normal. Wall thickness was  increased in a pattern of moderate to severe LVH. Systolic  function was normal. The estimated ejection fraction was in the  range of 60% to 65%. Wall motion was normal; there were no  regional wall motion abnormalities. Doppler parameters are  consistent with abnormal left ventricular relaxation (grade 1  diastolic dysfunction). - Aortic valve:  Mildly  calcified annulus. Trileaflet. There was  mild regurgitation. - Mitral valve: Calcified annulus. There was trivial regurgitation. - Left atrium: The atrium was mildly dilated. - Right atrium: Central venous pressure (est): 3 mm Hg. - Tricuspid valve: There was mild regurgitation. - Pulmonic valve: There was mild regurgitation. - Pulmonary arteries: PA peak pressure: 33 mm Hg (S). - Pericardium, extracardiac: A trivial pericardial effusion was  identified posterior to the heart. Bilateral pleural effusions   noted, largest on the left.   Objective: Filed Vitals:   04/11/16 1025 04/11/16 1318 04/11/16 2114 04/12/16 0641  BP: 135/56 133/55 133/63 137/66  Pulse: 72 67 68 69  Temp:  97.5 F (36.4 C) 98.4 F (36.9 C) 97.8 F (36.6 C)  TempSrc:  Oral Oral Oral  Resp:  20 20 15   Height:      Weight:    68.675 kg (151 lb 6.4 oz)  SpO2:  96% 95% 96%    Intake/Output Summary (Last 24 hours) at 04/12/16 0858 Last data filed at 04/12/16 [id number]  Gross per 24 hour  Intake 834.18 ml  Output   1300 ml  Net -465.82 ml   Filed Weights   04/09/16 1331 04/11/16 0628 04/12/16 0641  Weight: 69.9 kg (154 lb 1.6 oz) 68.357 kg (150 lb 11.2 oz) 68.675 kg (151 lb 6.4 oz)    Examination: General exam: Appears calm and comfortable  Respiratory system: Diminished breath sounds at left base. Respiratory effort normal. Cardiovascular system: S1 & S2 heard, RRR. No JVD, murmurs, rubs, gallops or clicks. No pedal edema. Gastrointestinal system: Abdomen is nondistended, soft and nontender. No organomegaly or masses felt. Normal bowel sounds heard. Central nervous system: Alert and oriented. No focal neurological deficits. Extremities: Symmetric 5 x 5 power. Skin: No rashes, lesions or ulcers Psychiatry: Judgement and insight appear normal. Mood & affect appropriate.     Data Reviewed: I have personally reviewed following labs and imaging studies  CBC:  Recent Labs Lab 04/09/16 0945  04/10/16 0412 04/11/16 0428 04/12/16 0515  WBC 7.9 8.9 8.5 11.0*  NEUTROABS 5.2  --   --   --   HGB 11.8* 12.2* 11.8* 12.0*  HCT 35.8* 37.7* 35.9* 36.6*  MCV 87.3 87.5 87.1 86.7  PLT 415* 404* 389 [id number]*   Basic Metabolic Panel:  Recent Labs Lab 04/09/16 0945 04/10/16 0412 04/11/16 0428  NA 142 142 137  K 3.3* 3.7 3.7  CL 104 104 104  CO2 27 28 27   GLUCOSE 112* 101* 117*  BUN 23* 18 13  CREATININE 1.08 1.05 1.00  CALCIUM 8.8* 8.8* 8.6*  MG  --  1.7  --    GFR: Estimated Creatinine Clearance: 57.2 mL/min (by C-G formula based on Cr of 1). Liver Function Tests:  Recent Labs Lab 04/09/16 0945 04/10/16 0412  AST 21 22  ALT 17 17  ALKPHOS 81 81  BILITOT 0.9 1.4*  PROT 6.5 6.2*  ALBUMIN 2.9* 2.8*    Recent Labs Lab 04/09/16 0945 04/10/16 0412  LIPASE 170* 128*  AMYLASE  --  232*   No results for input(s): AMMONIA in the last 168 hours. Coagulation Profile:  Recent Labs Lab 04/09/16 0945 04/10/16 0412 04/11/16 0428 04/12/16 0515  INR 1.63* 1.90* 2.36* 1.72*   Cardiac Enzymes:  Recent Labs Lab 04/09/16 0945 04/10/16 0412  TROPONINI 0.03 0.03   BNP (last 3 results) No results for input(s): PROBNP in the last 8760 hours. HbA1C:  Recent Labs  04/10/16 0412  HGBA1C 5.7*  CBG:  Recent Labs Lab 04/11/16 0739 04/11/16 1149 04/11/16 1612 04/11/16 2111 04/12/16 0755  GLUCAP 116* 115* 131* 114* 105*   Lipid Profile: No results for input(s): CHOL, HDL, LDLCALC, TRIG, CHOLHDL, LDLDIRECT in the last 72 hours. Thyroid Function Tests:  Recent Labs  04/10/16 0412  TSH 1.842   Anemia Panel: No results for input(s): VITAMINB12, FOLATE, FERRITIN, TIBC, IRON, RETICCTPCT in the last 72 hours. Urine analysis: No results found for: COLORURINE, APPEARANCEUR, LABSPEC, PHURINE, GLUCOSEU, HGBUR, BILIRUBINUR, KETONESUR, PROTEINUR, UROBILINOGEN, NITRITE, LEUKOCYTESUR Sepsis Labs: @LABRCNTIP (procalcitonin:4,lacticidven:4)  ) Recent Results (from the  past 240 hour(s))  Culture, body fluid-bottle     Status: None (Preliminary result)   Collection Time: 04/09/16 12:59 PM  Result Value Ref Range Status   Specimen Description PLEURAL  Final   Special Requests BOTTLES DRAWN AEROBIC AND ANAEROBIC 5CC EACH  Final   Culture NO GROWTH 2 DAYS  Final   Report Status PENDING  Incomplete  MRSA PCR Screening     Status: None   Collection Time: 04/09/16  1:44 PM  Result Value Ref Range Status   MRSA by PCR NEGATIVE NEGATIVE Final    Comment:        The GeneXpert MRSA Assay (FDA approved for NASAL specimens only), is one component of a comprehensive MRSA colonization surveillance program. It is not intended to diagnose MRSA infection nor to guide or monitor treatment for MRSA infections.          Radiology Studies: US Abdomen Complete  04/10/2016  CLINICAL DATA:  Necrotizing pancreatitis. Elevated serum lipase level. EXAM: ABDOMEN ULTRASOUND COMPLETE COMPARISON:  10/07/2015 FINDINGS: Gallbladder: Surgically absent. Common bile duct: Diameter: 5 mm, within normal limits. Liver: No focal lesion identified. Within normal limits in parenchymal echogenicity. IVC: No abnormality visualized. Pancreas: Although visualization of pancreas is limited by sonography, there are appear to be several small pancreatic fluid collections in the area the head and neck, measuring up to 2 cm. These are suspicious for pancreatic pseudocysts. Spleen: Size and appearance within normal limits. Right Kidney: Length: 10.3 cm. Echogenicity within normal limits. No mass or hydronephrosis visualized. Left Kidney: Length: 11.4 cm. Echogenicity within normal limits. No mass or hydronephrosis visualized. Abdominal aorta: No aneurysm visualized. Other findings: A small amount of free fluid is seen in the epigastric region adjacent left hepatic lobe. And small bilateral pleural effusions are also noted. IMPRESSION: Previous cholecystectomy.  No evidence of biliary ductal dilatation.  Small fluid collections in the area the pancreatic head and neck, suspicious for small pancreatic pseudocyst. Smaller free fluid in the upper abdomen and bilateral pleural effusions, are also likely related to pancreatitis. Consider abdomen CT with contrast for further evaluation if clinically warranted. Electronically Signed   By: Earle Gell M.D.   On: 04/10/2016 13:16        Scheduled Meds: . amLODipine  10 mg Oral QPM  . benazepril  40 mg Oral Daily  . docusate sodium  100 mg Oral BID  . febuxostat  80 mg Oral Daily  . finasteride  5 mg Oral Daily  . furosemide  40 mg Oral Daily  . hydrALAZINE  50 mg Oral TID  . insulin aspart  0-5 Units Subcutaneous QHS  . insulin aspart  0-9 Units Subcutaneous TID WC  . lipase/protease/amylase  12,000 Units Oral TID WC  . loratadine  10 mg Oral q1800  . metoprolol succinate  100 mg Oral Daily  . niacin  1,000 mg Oral QHS  . pantoprazole  40  mg Oral Daily  . potassium chloride  20 mEq Oral Daily  . pravastatin  40 mg Oral Daily  . sodium chloride flush  3 mL Intravenous Q12H   Continuous Infusions: . 0.9 % NaCl with KCl 20 mEq / L 10 mL/hr at 04/10/16 1118     LOS: 3 days    Time spent: 25 minutes    Kathie Dike, MD [facility] Pager [phone]  If 7PM-7AM, please contact night-coverage [email or web address] Password [email or web address] 04/12/2016, 8:58 AM

## 2016-04-13 LAB — BASIC METABOLIC PANEL
ANION GAP: 8 (ref 5–15)
BUN: 18 mg/dL (ref 6–20)
CALCIUM: 8.7 mg/dL — AB (ref 8.9–10.3)
CO2: 26 mmol/L (ref 22–32)
Chloride: 103 mmol/L (ref 101–111)
Creatinine, Ser: 1.07 mg/dL (ref 0.61–1.24)
Glucose, Bld: 104 mg/dL — ABNORMAL HIGH (ref 65–99)
Potassium: 3.7 mmol/L (ref 3.5–5.1)
Sodium: 137 mmol/L (ref 135–145)

## 2016-04-13 LAB — GLUCOSE, CAPILLARY
GLUCOSE-CAPILLARY: 132 mg/dL — AB (ref 65–99)
GLUCOSE-CAPILLARY: 130 mg/dL — AB (ref 65–99)
Glucose-Capillary: 92 mg/dL (ref 65–99)
Glucose-Capillary: 128 mg/dL — ABNORMAL HIGH (ref 65–99)
Glucose-Capillary: 132 mg/dL — ABNORMAL HIGH (ref 65–99)

## 2016-04-13 LAB — CBC
HEMATOCRIT: 33.3 % — AB (ref 39.0–52.0)
HEMOGLOBIN: 11.3 g/dL — AB (ref 13.0–17.0)
MCH: 29.5 pg (ref 26.0–34.0)
MCHC: 33.9 g/dL (ref 30.0–36.0)
MCV: 86.9 fL (ref 78.0–100.0)
Platelets: 352 10*3/uL (ref 150–400)
RBC: 3.83 MIL/uL — ABNORMAL LOW (ref 4.22–5.81)
RDW: 16.6 % — AB (ref 11.5–15.5)
WBC: 8.3 10*3/uL (ref 4.0–10.5)

## 2016-04-13 LAB — PROTIME-INR
INR: 1.36 (ref 0.00–1.49)
PROTHROMBIN TIME: 16.9 s — AB (ref 11.6–15.2)

## 2016-04-13 MED ORDER — ENOXAPARIN SODIUM 80 MG/0.8ML ~~LOC~~ SOLN
1.0000 mg/kg | Freq: Two times a day (BID) | SUBCUTANEOUS | Status: DC
  Administered 2016-04-13: 65 mg via SUBCUTANEOUS
  Filled 2016-04-13: qty 0.8

## 2016-04-13 NOTE — Evaluation (Signed)
Physical Therapy Evaluation Patient Details Name: Alan [name]. MRN: [id number] DOB: 08-25-1941 Today's Date: 04/13/2016   History of Present Illness  Alan [name]. is a 75 y.o. 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 His shortness of breath started approximately 2-3 weeks ago, mostly with exertion. However, it has progressed to shortness of breath at rest and particularly when laying flat. He has had more swelling in his ankles and legs. He takes Lasix as needed for leg swelling. He denies history of congestive heart failure. He has had a rare cough with clear sputum. He denies chest pain, fever, chills, abdominal pain, nausea, vomiting, diarrhea, pain with urination, black tarry stools, or bright red blood in his stools.  Dx: Large left pleural effusion with associated left lung compressive atelectasis and irregular left lower lobe bronchus  Clinical Impression  Pt received in bed, and was agreeable to PT evaluation.  Pt states that he lives with his wife and granddaughter.  He is normally independent with ADL's, and IADL's, and still drives.  Today during evaluation he was Mod (I) with all transfers, and independent with gait x 473ft with no AD and no overt gait deviations or LOB.  Pt does not demonstrate need for skilled PT at this time, will sign off.     Follow Up Recommendations No PT follow up    Equipment Recommendations  None recommended by PT    Recommendations for Other Services       Precautions / Restrictions Precautions Precautions: None Restrictions Weight Bearing Restrictions: No      Mobility  Bed Mobility Overal bed mobility: Modified Independent                Transfers Overall transfer level: Modified  independent                  Ambulation/Gait Ambulation/Gait assistance: Independent Ambulation Distance (Feet): 400 Feet Assistive device: None Gait Pattern/deviations: WFL(Within Functional Limits)        Stairs            Wheelchair Mobility    Modified Rankin (Stroke Patients Only)       Balance Overall balance assessment: Independent                                           Pertinent Vitals/Pain Pain Assessment: No/denies pain    Home Living Family/patient expects to be discharged to:: Private residence Living Arrangements: Spouse/significant other;Children Available Help at Discharge: Family   Home Access: Level entry     Home Layout: One level Home Equipment: Environmental consultant - 2 wheels;Cane - single point      Prior Function Level of Independence: Independent         Comments: community ambulator, still drivies.      Hand Dominance        Extremity/Trunk Assessment   Upper Extremity Assessment: Overall WFL for tasks assessed           Lower Extremity Assessment: Overall WFL for tasks assessed         Communication   Communication: No difficulties  Cognition Arousal/Alertness: Awake/alert Behavior During Therapy: WFL for tasks assessed/performed Overall Cognitive Status: Within Functional  Limits for tasks assessed                      General Comments      Exercises        Assessment/Plan    PT Assessment Patent does not need any further PT services  PT Diagnosis Generalized weakness   PT Problem List    PT Treatment Interventions     PT Goals (Current goals can be found in the Care Plan section) Acute Rehab PT Goals Patient Stated Goal: Pt wants to go home.  PT Goal Formulation: With patient Time For Goal Achievement: 04/14/16 Potential to Achieve Goals: Good    Frequency     Barriers to discharge        Co-evaluation               End of Session Equipment Utilized  During Treatment: Gait belt Activity Tolerance: Patient tolerated treatment well Patient left: in bed;with call bell/phone within reach;with nursing/sitter in room Nurse Communication: Mobility status    Functional Assessment Tool Used: [employer]	[place] AM- PAC "6-clicks" Functional Limitation: Mobility: Walking and moving around Mobility: Walking and Moving Around Current Status [phone]): 0 percent impaired, limited or restricted Mobility: Walking and Moving Around Goal Status [phone]): 0 percent impaired, limited or restricted Mobility: Walking and Moving Around Discharge Status [phone]): 0 percent impaired, limited or restricted    Time: 1329-1341 PT Time Calculation (min) (ACUTE ONLY): 12 min   Charges:   PT Evaluation $PT Eval Low Complexity: 1 Procedure     PT G Codes:   PT G-Codes **NOT FOR INPATIENT CLASS** Functional Assessment Tool Used: [place] AM- PAC "6-clicks" Functional Limitation: Mobility: Walking and moving around Mobility: Walking and Moving Around Current Status [phone]): 0 percent impaired, limited or restricted Mobility: Walking and Moving Around Goal Status [phone]): 0 percent impaired, limited or restricted Mobility: Walking and Moving Around Discharge Status [phone]): 0 percent impaired, limited or restricted    Beth Kyndra Condron, PT, DPT X: [id number]   04/13/2016, 1:48 PM

## 2016-04-13 NOTE — Progress Notes (Signed)
His CT scan after repeat thoracentesis still shows abnormalities in the left lower lobe bronchus. I am planning bronchoscopy in the morning. I discussed this with Alan [name] and he understands and agrees.

## 2016-04-13 NOTE — Progress Notes (Addendum)
ANTICOAGULATION CONSULT NOTE  Pharmacy Consult:  RESUME WARFARIN WHEN OK WITH MD Indication: Embolism and thrombosis of splenic artery  Allergies  Allergen Reactions  . Allopurinol Other (See Comments)    Significantly decreased WBC's  . Colchicine Other (See Comments)    Significantly decreased WBC's   Patient Measurements: Height: 5\' 5"  (165.1 cm) Weight: 147 lb 1.6 oz (66.724 kg) IBW/kg (Calculated) : 61.5 HEPARIN DW (KG): 69.9   Vital Signs: Temp: 98.2 F (36.8 C) (05/04 0559) Temp Source: Oral (05/04 0559) BP: 122/60 mmHg (05/04 0559) Pulse Rate: 65 (05/04 0559)  Labs:  Recent Labs  04/10/16 1821  04/11/16 0428 04/12/16 0515 04/13/16 0454  HGB  --   < > 11.8* 12.0* 11.3*  HCT  --   --  35.9* 36.6* 33.3*  PLT  --   --  389 413* 352  LABPROT  --   --  25.6* 20.1* 16.9*  INR  --   --  2.36* 1.72* 1.36  HEPARINUNFRC 0.26*  --  0.25*  --   --   CREATININE  --   --  1.00  --  1.07  < > = values in this interval not displayed.  Estimated Creatinine Clearance: 53.5 mL/min (by C-G formula based on Cr of 1.07).  Medical History: Past Medical History  Diagnosis Date  . Hypertension   . Heart murmur   . ED (erectile dysfunction)   . Agranulocytosis ([place])   . Diabetes mellitus   . Hyperlipidemia   . Gout   . Adenomatous colon polyp   . Prostate cancer ([place])     Followed by Dr. Jeffie Pollock   . Pancreatitis   . Gallstones   . Allergy   . Internal hemorrhoids   . Diverticulosis   . 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   Medications:  Prescriptions prior to admission  Medication Sig Dispense Refill Last Dose  . amLODipine (NORVASC) 10 MG tablet Take 1 tablet (10 mg total) by mouth every evening. 90 tablet 2 04/08/2016 at Unknown time  . benazepril (LOTENSIN) 40 MG tablet TAKE ONE TABLET BY MOUTH ONCE DAILY 90 tablet 2 04/08/2016 at Unknown time  . febuxostat (ULORIC) 40 MG tablet Take 2  tablets (80 mg total) by mouth daily. 60 tablet 3 04/08/2016 at Unknown time  . finasteride (PROSCAR) 5 MG tablet Take 5 mg by mouth daily.   04/08/2016 at Unknown time  . fluticasone (FLONASE) 50 MCG/ACT nasal spray Place 1 spray into both nostrils 2 (two) times daily as needed for allergies or rhinitis. 16 g 6 04/08/2016 at Unknown time  . furosemide (LASIX) 40 MG tablet Take 1 tablet (40 mg total) by mouth daily as needed for fluid. 30 tablet 1 04/08/2016 at Unknown time  . hydrALAZINE (APRESOLINE) 50 MG tablet Take 1 tablet (50 mg total) by mouth 3 (three) times daily. 90 tablet 1 04/08/2016 at Unknown time  . levocetirizine (XYZAL) 5 MG tablet Take 1 tablet (5 mg total) by mouth every evening. 30 tablet 1 04/08/2016 at Unknown time  . metFORMIN (GLUCOPHAGE) 500 MG tablet TAKE ONE TABLET BY MOUTH TWICE DAILY WITH A MEAL 180 tablet 1 04/08/2016 at Unknown time  . metoprolol succinate (TOPROL-XL) 100 MG 24 hr tablet Take 1 tablet (100 mg total) by mouth 1 day or 1 dose. (Patient taking differently: Take 100 mg by mouth daily. ) 90 tablet 2 04/08/2016 at Unknown time  . niacin (NIASPAN)  1000 MG CR tablet Take 1 tablet (1,000 mg total) by mouth at bedtime. 30 tablet 3 04/08/2016 at Unknown time  . omeprazole (PRILOSEC) 20 MG capsule Take 1 capsule (20 mg total) by mouth daily. 30 capsule 3 04/08/2016 at Unknown time  . pravastatin (PRAVACHOL) 40 MG tablet Take 40 mg by mouth daily.   04/08/2016 at Unknown time  . warfarin (COUMADIN) 1 MG tablet Take 1 tablet (1 mg total) by mouth daily. 30 tablet 1 04/08/2016 at Unknown time  . warfarin (COUMADIN) 5 MG tablet TAKE ONE TABLET BY MOUTH ONCE DAILY AT  6  PM 30 tablet 5 04/08/2016 at Unknown time   Assessment: 75 yo male with h/o thrombosis of splenic artery 10/2015 which he is on Coumadin. Coumadin on hold for thoracentesis (done) and Dr Luan Pulling planning bronchoscopy on 5/5.   Pt is currently on full dose Lovenox.  Plan:  Resume Warfarin when OK / ordered by  MD Continue Lovenox as per MD.   Hart Robinsons, PharmD Clinical Pharmacist Pager:  [phone] 04/13/2016 11:49 AM

## 2016-04-13 NOTE — Progress Notes (Signed)
PROGRESS NOTE    [name]  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 CXR showed extensive abnormal opacity in the left thorax consistent with a large effusion and underlying consolidation. Contrasted CT showed large left pleural effusion with compressive atelectasis of the left lung, a small right pleural effusion.Thoracentesis has been performed and fluid cytology has been negative. Follow up CT scan shows persistent abnormality in the left bronchus. 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 Large left pleural effusion with associated left lung compressive atelectasis and irregular left lower lobe bronchus. Patient has had 2 thoracentesis during his hospital stay. Initially done in the ER, thoracentesis revealed 850 mL of fluid. Second thoracentesis was done on 5/3 with removal of 1.6 L of fluid. Cytology of fluid thus far has been negative. Follow-up CT scan of the chest indicated persistent left bronchus abnormalities. Dr. Luan Pulling has been following, and will perform a bronchoscopy 5/05. Await any further recommendations. 2. 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 Initially patient had 2+ lower extremity edema. He was started on IV Lasix with improvement. BNP wnl. He has since been transitioned to oral Lasix. ECHO showed EF 60-65% with grade 1 diastolic dysfunction. 3. Abnormal  pancreas. S/p cholecystectomy. Lipase modestly elevated but has improved. Amylase was also noted in pleural fluid. GI has been consulted, input appreciated. CT of chest done on admission indicated peripancreatic fluid density and poor enhancement of the pancreatic head worrisome for necrosis. Lipase is only modestly elevated. Clinically, the patient was not complaining of any abdominal pain or vomiting. GI recommendations are to repeat CT scan in 4 weeks. 4. Embolism/ thrombosis of splenic vein. INR was slightly sub-therapeutic on admission. Patient was started on heparin infusion. INR was therapeutic on 5/2 and heparin was discontinued. Since that time, INR has trended down. Discussed with Dr. Luan Pulling and will restart anticoagulation tomorrow after bronchoscopy. Will continue Coumadin per pharmacy. Will also discuss with GI need to continue coumadin, since he has been on treatment since 10/2015. 5. Hypertension. Patient is on multiple antihypertensives. Blood pressure is currently stable. 6. Diabetes mellitus type 2. Continue to follow with sliding scale insulin. Restart metformin on discharge.   DVT prophylaxis: coumadin>>heparin gtt Code Status: Full code Family Communication: Discussed patient and wife. Disposition Plan: Discharge to home when clinically appropriate.   Consultants:   Pulmonology, Dr. Luan Pulling  Gastroenterology  Procedures:  Thoracentesis on 4/30 with removal of 850 mL of fluid Thoracentesis on 5/3 with removal of 1.6 L of fluid -2-D echocardiogram:Study Conclusions - Left ventricle: The cavity size was normal. Wall thickness was  increased in a pattern of moderate to severe LVH. Systolic  function was normal. The estimated ejection fraction was in the  range of 60% to 65%. Wall motion was normal; there were no  regional wall motion abnormalities. Doppler parameters are  consistent with abnormal left ventricular relaxation (grade 1  diastolic dysfunction). -  Aortic valve: Mildly calcified annulus. Trileaflet. There was  mild regurgitation. - Mitral valve: Calcified annulus. There was trivial regurgitation. - Left atrium: The atrium was mildly dilated. - Right atrium: Central venous pressure (est): 3 mm Hg. - Tricuspid valve: There was mild regurgitation. - Pulmonic valve: There was mild regurgitation. - Pulmonary arteries: PA peak pressure: 33 mm Hg (S). - Pericardium, extracardiac: A trivial pericardial effusion was  identified posterior to the heart. Bilateral pleural effusions   noted, largest on the left.  Subjective: Feels improved today. He has been able to ambulate well.  Objective: Filed Vitals:   04/12/16 1050 04/12/16 1500 04/12/16 2239 04/13/16 0559  BP: 116/58 129/64 137/57 122/60  Pulse: 68 68 78 65  Temp:  98.1 F (36.7 C) 98.9 F (37.2 C) 98.2 F (36.8 C)  TempSrc:  Oral Oral Oral  Resp: 16 16 20 17   Height:      Weight:    66.724 kg (147 lb 1.6 oz)  SpO2: 97% 96% 98% 97%    Intake/Output Summary (Last 24 hours) at 04/13/16 0754 Last data filed at 04/13/16 0614  Gross per 24 hour  Intake    723 ml  Output   1800 ml  Net  -1077 ml   Filed Weights   04/11/16 0628 04/12/16 0641 04/13/16 0559  Weight: 68.357 kg (150 lb 11.2 oz) 68.675 kg (151 lb 6.4 oz) 66.724 kg (147 lb 1.6 oz)    Examination:  General exam: Appears calm and comfortable  Respiratory system: Diminished breath sounds at left base, otherwise clear Cardiovascular system: S1 & S2 heard, RRR. No JVD, murmurs, rubs, gallops or clicks. Trace pedal edema. Gastrointestinal system: Abdomen is nondistended, soft and nontender. No organomegaly or masses felt. Normal bowel sounds heard. Central nervous system: Alert and oriented. No focal neurological deficits. Extremities: Symmetric 5 x 5 power. Skin: No rashes, lesions or ulcers Psychiatry: Judgement and insight appear normal. Mood & affect appropriate.    Data Reviewed: I have personally  reviewed following labs and imaging studies  CBC:  Recent Labs Lab 04/09/16 0945 04/10/16 0412 04/11/16 0428 04/12/16 0515 04/13/16 0454  WBC 7.9 8.9 8.5 11.0* 8.3  NEUTROABS 5.2  --   --   --   --   HGB 11.8* 12.2* 11.8* 12.0* 11.3*  HCT 35.8* 37.7* 35.9* 36.6* 33.3*  MCV 87.3 87.5 87.1 86.7 86.9  PLT 415* 404* 389 413* [id number]   Basic Metabolic Panel:  Recent Labs Lab 04/09/16 0945 04/10/16 0412 04/11/16 0428 04/13/16 0454  NA 142 142 137 137  K 3.3* 3.7 3.7 3.7  CL 104 104 104 103  CO2 27 28 27 26   GLUCOSE 112* 101* 117* 104*  BUN 23* 18 13 18   CREATININE 1.08 1.05 1.00 1.07  CALCIUM 8.8* 8.8* 8.6* 8.7*  MG  --  1.7  --   --    GFR: Estimated Creatinine Clearance: 53.5 mL/min (by C-G formula based on Cr of 1.07). Liver Function Tests:  Recent Labs Lab 04/09/16 0945 04/10/16 0412  AST 21 22  ALT 17 17  ALKPHOS 81 81  BILITOT 0.9 1.4*  PROT 6.5 6.2*  ALBUMIN 2.9* 2.8*    Recent Labs Lab 04/09/16 0945 04/10/16 0412  LIPASE 170* 128*  AMYLASE  --  232*   No results for input(s): AMMONIA in the last 168 hours. Coagulation Profile:  Recent Labs Lab 04/09/16 0945 04/10/16 0412 04/11/16 0428 04/12/16 0515 04/13/16 0454  INR 1.63* 1.90* 2.36*  1.72* 1.36   Cardiac Enzymes:  Recent Labs Lab 04/09/16 0945 04/10/16 0412  TROPONINI 0.03 0.03   BNP (last 3 results) No results for input(s): PROBNP in the last 8760 hours. HbA1C: No results for input(s): HGBA1C in the last 72 hours. CBG:  Recent Labs Lab 04/11/16 2111 04/12/16 0755 04/12/16 1240 04/12/16 1637 04/12/16 2236  GLUCAP 114* 105* 107* 121* 132*   Lipid Profile: No results for input(s): CHOL, HDL, LDLCALC, TRIG, CHOLHDL, LDLDIRECT in the last 72 hours. Thyroid Function Tests: No results for input(s): TSH, T4TOTAL, FREET4, T3FREE, THYROIDAB in the last 72 hours. Anemia Panel: No results for input(s): VITAMINB12, FOLATE, FERRITIN, TIBC, IRON, RETICCTPCT in the last 72  hours. Urine analysis: No results found for: COLORURINE, APPEARANCEUR, LABSPEC, PHURINE, GLUCOSEU, HGBUR, BILIRUBINUR, KETONESUR, PROTEINUR, UROBILINOGEN, NITRITE, LEUKOCYTESUR Sepsis Labs: @LABRCNTIP (procalcitonin:4,lacticidven:4)  ) Recent Results (from the past 240 hour(s))  Fungus Culture With Stain (Not @ [facility])     Status: None (Preliminary result)   Collection Time: 04/09/16 12:59 PM  Result Value Ref Range Status   Fungus Stain Final report  Final    Comment: (NOTE) Performed At: [facility] [place], [place] [id number] Lindon Romp MD [id number]    Fungus (Mycology) Culture PENDING  Incomplete   Fungal Source PLEURAL  Final  Culture, body fluid-bottle     Status: None (Preliminary result)   Collection Time: 04/09/16 12:59 PM  Result Value Ref Range Status   Specimen Description FLUID PLEURAL  Final   Special Requests BOTTLES DRAWN AEROBIC AND ANAEROBIC 5CC EACH  Final   Culture NO GROWTH 3 DAYS  Final   Report Status PENDING  Incomplete  Fungus Culture Result     Status: None   Collection Time: 04/09/16 12:59 PM  Result Value Ref Range Status   Result 1 Comment  Final    Comment: (NOTE) KOH/Calcofluor preparation:  no fungus observed. Performed At: [facility] [place], [place] [id number] Lindon Romp MD [id number]   MRSA PCR Screening     Status: None   Collection Time: 04/09/16  1:44 PM  Result Value Ref Range Status   MRSA by PCR NEGATIVE NEGATIVE Final    Comment:        The GeneXpert MRSA Assay (FDA approved for NASAL specimens only), is one component of a comprehensive MRSA colonization surveillance program. It is not intended to diagnose MRSA infection nor to guide or monitor treatment for MRSA infections.          Radiology Studies: Ct Chest Wo Contrast  04/12/2016  CLINICAL DATA:  LEFT pleural effusion EXAM: CT CHEST WITHOUT CONTRAST TECHNIQUE: Multidetector CT imaging of the chest  was performed following the standard protocol without IV contrast. COMPARISON:  04/09/2016 FINDINGS: Scattered atherosclerotic calcifications aorta and coronary arteries. Upper normal caliber ascending thoracic aorta 3.7 cm transverse. Scattered normal sized mediastinal lymph nodes without definite adenopathy. Dilated main pulmonary artery. Questionable gastric wall thickening versus artifact from underdistention. Small amount of free fluid is seen in the upper abdomen perigastric. Fluid and probably small lymph nodes at porta hepatis and gastrohepatic ligament. Enlargement of pancreatic head, unable to exclude pancreatic mass of this could all bliss of the seen with focal pancreatitis. Gallbladder surgically absent. Remaining visualized upper abdomen unremarkable. BILATERAL pleural effusions LEFT larger than RIGHT, LEFT decreased in size since the previous study post thoracentesis. No discrete pleural based mass identified by noncontrast exam. Significant compressive atelectasis in both lower lobes. Irregular LEFT lower  lobe bronchus again identified with an area of nodular wall thickening on coronal imaging, unable to exclude endobronchial lesion. No additional lung abnormalities. Degenerative disc disease changes thoracic spine. IMPRESSION: Persistent bibasilar effusions and atelectasis LEFT greater than RIGHT despite preceding LEFT thoracentesis. Persistent endobronchial irregularity and suspected nodularity in the LEFT lower lobe bronchus, unable to exclude endobronchial lesion; recommend bronchoscopic assessment. Free fluid in upper abdomen with questionable gastric wall thickening versus artifact and enlargement of pancreatic head question mass versus focal pancreatitis ; followup CT imaging of the abdomen with oral contrast and with/without IV contrast utilizing focus pancreatic protocol recommended to further assess these findings. Findings called to Dr. Luan Pulling on 04/12/2016 at 1415 hours. Electronically  Signed   By: Lavonia Dana M.D.   On: 04/12/2016 14:19   US Thoracentesis Asp Pleural Space W/img Guide  04/12/2016  INDICATION: LEFT pleural effusion EXAM: ULTRASOUND GUIDED THERAPEUTIC LEFT THORACENTESIS PROCEDURE: Procedure, benefits, and risks of procedure were discussed with patient. Written informed consent for procedure was obtained. Time out protocol followed. Pleural effusion localized by ultrasound at the posterior LEFT hemithorax. Skin prepped and draped in usual sterile fashion. Skin and soft tissues anesthetized with 10 mL of 1% lidocaine. 8 French thoracentesis catheter placed into the LEFT pleural space. 1.63 L of dark old bloody fluid was aspirated by syringe pump. Procedure tolerated well by patient without immediate complication. No pneumothorax on postprocedural CT. FINDINGS: As above IMPRESSION: Successful ultrasound guided LEFT thoracentesis yielding 1.63 L of old bloody pleural fluid. Electronically Signed   By: Lavonia Dana M.D.   On: 04/12/2016 12:11        Scheduled Meds: . amLODipine  10 mg Oral QPM  . benazepril  40 mg Oral Daily  . docusate sodium  100 mg Oral BID  . febuxostat  80 mg Oral Daily  . finasteride  5 mg Oral Daily  . furosemide  40 mg Oral Daily  . hydrALAZINE  50 mg Oral TID  . insulin aspart  0-5 Units Subcutaneous QHS  . insulin aspart  0-9 Units Subcutaneous TID WC  . lipase/protease/amylase  12,000 Units Oral TID WC  . loratadine  10 mg Oral q1800  . metoprolol succinate  100 mg Oral Daily  . niacin  1,000 mg Oral QHS  . pantoprazole  40 mg Oral Daily  . potassium chloride  20 mEq Oral Daily  . pravastatin  40 mg Oral Daily  . sodium chloride flush  3 mL Intravenous Q12H   Continuous Infusions: . 0.9 % NaCl with KCl 20 mEq / L 10 mL/hr at 04/12/16 1552     LOS: 4 days    Time spent: 25 minutes    Kathie Dike, MD [facility] Pager [phone]  If 7PM-7AM, please contact night-coverage [email or web address] Password  [email or web address] 04/13/2016, 7:54 AM   By signing my name below, I, Delene Ruffini, attest that this documentation has been prepared under the direction and in the presence of Kathie Dike, MD. Electronically Signed: Delene Ruffini 04/13/2016 12:10pm  I, Dr. Kathie Dike, personally performed the services described in this documentaiton. All medical record entries made by the scribe were at my direction and in my presence. I have reviewed the chart and agree that the record reflects my personal performance and is accurate and complete  Kathie Dike, MD, 04/13/2016 12:51 PM

## 2016-04-14 ENCOUNTER — Encounter (HOSPITAL_COMMUNITY)
Admission: EM | Disposition: A | Discharge status: Home / Self Care | Payer: Self-pay | Source: Home / Self Care | Attending: Internal Medicine

## 2016-04-14 ENCOUNTER — Encounter (HOSPITAL_COMMUNITY): Payer: Self-pay | Admitting: *Deleted

## 2016-04-14 DIAGNOSIS — J90 Pleural effusion, not elsewhere classified: Secondary | ICD-10-CM | POA: Diagnosis not present

## 2016-04-14 DIAGNOSIS — R918 Other nonspecific abnormal finding of lung field: Secondary | ICD-10-CM | POA: Diagnosis not present

## 2016-04-14 HISTORY — PX: FLEXIBLE BRONCHOSCOPY: SHX5094

## 2016-04-14 LAB — GLUCOSE, CAPILLARY
GLUCOSE-CAPILLARY: 102 mg/dL — AB (ref 65–99)
GLUCOSE-CAPILLARY: 140 mg/dL — AB (ref 65–99)
Glucose-Capillary: 106 mg/dL — ABNORMAL HIGH (ref 65–99)
Glucose-Capillary: 97 mg/dL (ref 65–99)
Glucose-Capillary: 101 mg/dL — ABNORMAL HIGH (ref 65–99)

## 2016-04-14 LAB — CBC
HEMATOCRIT: 33.2 % — AB (ref 39.0–52.0)
HEMOGLOBIN: 11 g/dL — AB (ref 13.0–17.0)
MCH: 29.2 pg (ref 26.0–34.0)
MCHC: 33.1 g/dL (ref 30.0–36.0)
MCV: 88.1 fL (ref 78.0–100.0)
Platelets: 400 10*3/uL (ref 150–400)
RBC: 3.77 MIL/uL — ABNORMAL LOW (ref 4.22–5.81)
RDW: 17.1 % — AB (ref 11.5–15.5)
WBC: 8.1 10*3/uL (ref 4.0–10.5)

## 2016-04-14 LAB — PROTIME-INR
INR: 1.25 (ref 0.00–1.49)
PROTHROMBIN TIME: 15.9 s — AB (ref 11.6–15.2)

## 2016-04-14 SURGERY — BRONCHOSCOPY, FLEXIBLE
Anesthesia: Moderate Sedation | Laterality: Bilateral

## 2016-04-14 MED ORDER — LACTATED RINGERS IV SOLN
Freq: Once | INTRAVENOUS | Status: AC
  Administered 2016-04-14: 1000 mL via INTRAVENOUS

## 2016-04-14 MED ORDER — WARFARIN - PHARMACIST DOSING INPATIENT: Freq: Every day | Status: DC

## 2016-04-14 MED ORDER — MIDAZOLAM HCL 10 MG/2ML IJ SOLN
INTRAMUSCULAR | Status: DC | PRN
  Administered 2016-04-14: 2.5 mg via INTRAVENOUS
  Administered 2016-04-14: 2 mg via INTRAVENOUS

## 2016-04-14 MED ORDER — MIDAZOLAM HCL 5 MG/5ML IJ SOLN
INTRAMUSCULAR | Status: AC
  Filled 2016-04-14: qty 10

## 2016-04-14 MED ORDER — BUTAMBEN-TETRACAINE-BENZOCAINE 2-2-14 % EX AERO
INHALATION_SPRAY | CUTANEOUS | Status: DC | PRN
  Administered 2016-04-14: 1 via TOPICAL

## 2016-04-14 MED ORDER — LIDOCAINE HCL (PF) 2 % IJ SOLN
INTRAMUSCULAR | Status: AC
  Filled 2016-04-14: qty 20

## 2016-04-14 MED ORDER — LIDOCAINE VISCOUS 2 % MT SOLN
OROMUCOSAL | Status: AC
  Filled 2016-04-14: qty 15

## 2016-04-14 MED ORDER — WARFARIN SODIUM 5 MG PO TABS
6.0000 mg | ORAL_TABLET | Freq: Once | ORAL | Status: AC
  Administered 2016-04-14: 6 mg via ORAL
  Filled 2016-04-14: qty 1

## 2016-04-14 MED ORDER — ENOXAPARIN SODIUM 80 MG/0.8ML ~~LOC~~ SOLN
1.0000 mg/kg | Freq: Two times a day (BID) | SUBCUTANEOUS | Status: DC
  Administered 2016-04-14 – 2016-04-19 (×10): 65 mg via SUBCUTANEOUS
  Filled 2016-04-14 (×10): qty 0.8

## 2016-04-14 MED ORDER — LIDOCAINE VISCOUS 2 % MT SOLN
OROMUCOSAL | Status: DC | PRN
  Administered 2016-04-14: 3 mL via OROMUCOSAL

## 2016-04-14 SURGICAL SUPPLY — 16 items
BRUSH CYTOL CELLEBRITY 1.5X140 (MISCELLANEOUS) ×3 IMPLANT
CLOTH BEACON ORANGE TIMEOUT ST (SAFETY) ×3 IMPLANT
CONNECTOR 5 IN 1 STRAIGHT STRL (MISCELLANEOUS) ×3 IMPLANT
FORCEPS BIOP RJ4 1.8 (CUTTING FORCEPS) ×3 IMPLANT
GLOVE BIO SURGEON STRL SZ7.5 (GLOVE) ×3 IMPLANT
KIT CLEAN CATCH URINE (SET/KITS/TRAYS/PACK) IMPLANT
MARKER SKIN DUAL TIP RULER LAB (MISCELLANEOUS) ×3 IMPLANT
NS IRRIG 1000ML POUR BTL (IV SOLUTION) ×3 IMPLANT
SPONGE GAUZE 4X4 12PLY (GAUZE/BANDAGES/DRESSINGS) ×3 IMPLANT
SYR 20CC LL (SYRINGE) ×3 IMPLANT
SYR 30ML LL (SYRINGE) ×3 IMPLANT
SYR CONTROL 10ML LL (SYRINGE) ×3 IMPLANT
TRAP SPECIMEN CP (MISCELLANEOUS) ×3 IMPLANT
VALVE DISPOSABLE (MISCELLANEOUS) ×3 IMPLANT
WATER STERILE IRR 1000ML POUR (IV SOLUTION) ×3 IMPLANT
YANKAUER SUCT BULB TIP 10FT TU (MISCELLANEOUS) ×6 IMPLANT

## 2016-04-14 NOTE — Care Management Important Message (Signed)
Important Message  Patient Details  Name: Alan [name]. MRN: [id number] Date of Birth: 1941/08/30   Medicare Important Message Given:  Yes    Alvie Heidelberg, RN 04/14/2016, 2:37 PM

## 2016-04-14 NOTE — Op Note (Signed)
Bronchoscopy Procedure Note Alan [name] [id number] 10/25/1941  Procedure: Bronchoscopy Indications: Diagnostic evaluation of the airways and Obtain specimens for culture and/or other diagnostic studies  Procedure Details Consent: Risks of procedure as well as the alternatives and risks of each were explained to the (patient/caregiver).  Consent for procedure obtained. Time Out: Verified patient identification, verified procedure, site/side was marked, verified correct patient position, special equipment/implants available, medications/allergies/relevent history reviewed, required imaging and test results available.  Performed  In preparation for procedure, bronchoscope lubricated. Sedation: Benzodiazepines  Airway entered and the following bronchi were examined: RUL, RML, RLL, LUL and LLL.  No endobronchial lesions were seen Procedures performed: Brushings performed Bronchoscope removed.    Evaluation Hemodynamic Status: BP stable throughout; O2 sats: transiently fell during during procedure Patient's Current Condition: stable Specimens:  Sent serosanguinous fluid Complications: No apparent complications Patient did tolerate procedure well. Total scope time 13 minutes  Adarsh Mundorf L 04/14/2016

## 2016-04-14 NOTE — Progress Notes (Signed)
PROGRESS NOTE    Alan [name].  [place]:[id number] DOB: 11-10-1941 DOA: 04/09/2016 PCP: Fransisca Kaufmann Dettinger, MD Outpatient Specialists:    Brief Narrative:  61 yom with a hx of acute necrotizing biliary pancreatitis in 2016 s/p cholecystectomy, prostate CA, HTN, and DM type 2, presented to the ED with complaints of SOB. CXR showed extensive abnormal opacity in the left thorax consistent with a large effusion and underlying consolidation. Contrasted CT showed large left pleural effusion with compressive atelectasis of the left lung, a small right pleural effusion.Thoracentesis has been performed and fluid cytology has been negative. Follow up CT scan shows persistent abnormality in the left bronchus. He underwent bronchoscopy on 5/5 and biopsy were done. He has been restarted on anticoagulation and can be discharged home once INR is therapeutic.   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 Large left pleural effusion with associated left lung compressive atelectasis and irregular left lower lobe bronchus. Patient has had 2 thoracentesis during his hospital stay. Initially done in the ER, thoracentesis revealed 850 mL of fluid. Second thoracentesis was done on 5/3 with removal of 1.6 L of fluid. Cytology of fluid negative. Follow-up CT scan of the chest indicated persistent left bronchus abnormalities. Dr. Luan Pulling has been following, and will perform a bronchoscopy 5/05. Await any further recommendations. 2. 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 BNP wnl. Initially patient had 2+ lower extremity edema.  ECHO showed EF 60-65% with grade 1 diastolic dysfunction. Continue oral  Lasix. 3. Abnormal pancreas. S/p cholecystectomy. Lipase modestly elevated but has improved. Amylase was also noted in pleural fluid. GI has been consulted, input appreciated. CT of chest done on admission indicated peripancreatic fluid density and poor enhancement of the pancreatic head worrisome for necrosis. Clinically, the patient was not complaining of any abdominal pain or vomiting. GI recommendations are to repeat CT scan in 4 weeks. 4. Embolism/ thrombosis of splenic vein. INR was slightly sub-therapeutic on admission. Patient was started on heparin infusion. INR was therapeutic on 5/2 and heparin was discontinued. Since that time, INR has trended down to normal range. Since patient has completed 6 months of anticoagulation therapy, recent images were reviewed with radiologist to determine if splenic vein had recannulized. After reviewing recent abdominal ultrasound, it was felt that his splenic vein was not patent. Case was discussed with hematology, Dr. Whitney Muse and it was recommended to continue the patient on anticoagulation for now, and follow up in hematology clinic for further management. Will start coumadin with lovenox bridge. He will need to stay in the hospital until INR is therapeutic. 5. Hypertension. Patient is on multiple antihypertensives. Blood pressure is currently stable.  6. Diabetes mellitus type 2. Continue to follow with sliding scale insulin. Restart metformin on discharge.   DVT prophylaxis: coumadin>>lovenox Code Status: Full code Family Communication: Discussed patient Disposition Plan: Discharge to home when clinically appropriate.   Consultants:   Pulmonology, Dr. Luan Pulling  Gastroenterology  Procedures:  Thoracentesis on 4/30 with removal of 850 mL of fluid Thoracentesis on 5/3 with removal of 1.6 L of fluid -2-D echocardiogram:Study Conclusions - Left ventricle: The cavity size was normal. Wall thickness was  increased in a  pattern of moderate to severe  LVH. Systolic  function was normal. The estimated ejection fraction was in the  range of 60% to 65%. Wall motion was normal; there were no  regional wall motion abnormalities. Doppler parameters are  consistent with abnormal left ventricular relaxation (grade 1  diastolic dysfunction). - Aortic valve: Mildly calcified annulus. Trileaflet. There was  mild regurgitation. - Mitral valve: Calcified annulus. There was trivial regurgitation. - Left atrium: The atrium was mildly dilated. - Right atrium: Central venous pressure (est): 3 mm Hg. - Tricuspid valve: There was mild regurgitation. - Pulmonic valve: There was mild regurgitation. - Pulmonary arteries: PA peak pressure: 33 mm Hg (S). - Pericardium, extracardiac: A trivial pericardial effusion was  identified posterior to the heart. Bilateral pleural effusions   noted, largest on the left.  Subjective: Feels improved today. His breathing is doing well. Abd feels okay.  Objective: Filed Vitals:   04/13/16 1445 04/13/16 1545 04/13/16 2222 04/14/16 0519  BP:  125/54 118/47 120/52  Pulse:  70 73 64  Temp:   99.1 F (37.3 C) 99.4 F (37.4 C)  TempSrc:   Oral Oral  Resp:  18 20 18   Height:      Weight:    66.724 kg (147 lb 1.6 oz)  SpO2: 96% 97% 97% 96%    Intake/Output Summary (Last 24 hours) at 04/14/16 0735 Last data filed at 04/14/16 0400  Gross per 24 hour  Intake    480 ml  Output   1100 ml  Net   -620 ml   Filed Weights   04/12/16 0641 04/13/16 0559 04/14/16 0519  Weight: 68.675 kg (151 lb 6.4 oz) 66.724 kg (147 lb 1.6 oz) 66.724 kg (147 lb 1.6 oz)    Examination:  General exam: Appears calm and comfortable  Respiratory system: Clear to auscultation. Respiratory effort normal. Cardiovascular system: S1 & S2 heard, RRR. No JVD, murmurs, rubs, gallops or clicks. No pedal edema. Gastrointestinal system: Abdomen is nondistended, soft and nontender. No organomegaly or masses felt. Normal bowel sounds  heard. Central nervous system: Alert and oriented. No focal neurological deficits. Extremities: Symmetric 5 x 5 power. Skin: No rashes, lesions or ulcers Psychiatry: Judgement and insight appear normal. Mood & affect appropriate.   Data Reviewed: I have personally reviewed following labs and imaging studies  CBC:  Recent Labs Lab 04/09/16 0945 04/10/16 0412 04/11/16 0428 04/12/16 0515 04/13/16 0454 04/14/16 0443  WBC 7.9 8.9 8.5 11.0* 8.3 8.1  NEUTROABS 5.2  --   --   --   --   --   HGB 11.8* 12.2* 11.8* 12.0* 11.3* 11.0*  HCT 35.8* 37.7* 35.9* 36.6* 33.3* 33.2*  MCV 87.3 87.5 87.1 86.7 86.9 88.1  PLT 415* 404* 389 413* 352 [id number]   Basic Metabolic Panel:  Recent Labs Lab 04/09/16 0945 04/10/16 0412 04/11/16 0428 04/13/16 0454  NA 142 142 137 137  K 3.3* 3.7 3.7 3.7  CL 104 104 104 103  CO2 27 28 27 26   GLUCOSE 112* 101* 117* 104*  BUN 23* 18 13 18   CREATININE 1.08 1.05 1.00 1.07  CALCIUM 8.8* 8.8* 8.6* 8.7*  MG  --  1.7  --   --    GFR: Estimated Creatinine Clearance: 53.5 mL/min (by C-G formula based on Cr of 1.07). Liver Function Tests:  Recent Labs Lab 04/09/16 0945 04/10/16 0412  AST 21 22  ALT 17 17  ALKPHOS 81 81  BILITOT 0.9 1.4*  PROT 6.5 6.2*  ALBUMIN  2.9* 2.8*    Recent Labs Lab 04/09/16 0945 04/10/16 0412  LIPASE 170* 128*  AMYLASE  --  232*   No results for input(s): AMMONIA in the last 168 hours. Coagulation Profile:  Recent Labs Lab 04/10/16 0412 04/11/16 0428 04/12/16 0515 04/13/16 0454 04/14/16 0443  INR 1.90* 2.36* 1.72* 1.36 1.25   Cardiac Enzymes:  Recent Labs Lab 04/09/16 0945 04/10/16 0412  TROPONINI 0.03 0.03   BNP (last 3 results) No results for input(s): PROBNP in the last 8760 hours. HbA1C: No results for input(s): HGBA1C in the last 72 hours. CBG:  Recent Labs Lab 04/12/16 2236 04/13/16 0803 04/13/16 1149 04/13/16 1638 04/13/16 2218  GLUCAP 132* 92 128* 130* 132*   Lipid Profile: No results  for input(s): CHOL, HDL, LDLCALC, TRIG, CHOLHDL, LDLDIRECT in the last 72 hours. Thyroid Function Tests: No results for input(s): TSH, T4TOTAL, FREET4, T3FREE, THYROIDAB in the last 72 hours. Anemia Panel: No results for input(s): VITAMINB12, FOLATE, FERRITIN, TIBC, IRON, RETICCTPCT in the last 72 hours. Urine analysis: No results found for: COLORURINE, APPEARANCEUR, LABSPEC, PHURINE, GLUCOSEU, HGBUR, BILIRUBINUR, KETONESUR, PROTEINUR, UROBILINOGEN, NITRITE, LEUKOCYTESUR Sepsis Labs: @LABRCNTIP (procalcitonin:4,lacticidven:4)  ) Recent Results (from the past 240 hour(s))  Fungus Culture With Stain (Not @ [facility])     Status: None (Preliminary result)   Collection Time: 04/09/16 12:59 PM  Result Value Ref Range Status   Fungus Stain Final report  Final    Comment: (NOTE) Performed At: [facility] [place], [place] [id number] Lindon Romp MD [id number]    Fungus (Mycology) Culture PENDING  Incomplete   Fungal Source PLEURAL  Final  Culture, body fluid-bottle     Status: None (Preliminary result)   Collection Time: 04/09/16 12:59 PM  Result Value Ref Range Status   Specimen Description FLUID PLEURAL  Final   Special Requests BOTTLES DRAWN AEROBIC AND ANAEROBIC 5CC EACH  Final   Culture NO GROWTH 4 DAYS  Final   Report Status PENDING  Incomplete  Fungus Culture Result     Status: None   Collection Time: 04/09/16 12:59 PM  Result Value Ref Range Status   Result 1 Comment  Final    Comment: (NOTE) KOH/Calcofluor preparation:  no fungus observed. Performed At: [facility] [place], [place] [id number] Lindon Romp MD [id number]   MRSA PCR Screening     Status: None   Collection Time: 04/09/16  1:44 PM  Result Value Ref Range Status   MRSA by PCR NEGATIVE NEGATIVE Final    Comment:        The GeneXpert MRSA Assay (FDA approved for NASAL specimens only), is one component of a comprehensive MRSA colonization surveillance  program. It is not intended to diagnose MRSA infection nor to guide or monitor treatment for MRSA infections.       Radiology Studies: Ct Chest Wo Contrast  04/12/2016  CLINICAL DATA:  LEFT pleural effusion EXAM: CT CHEST WITHOUT CONTRAST TECHNIQUE: Multidetector CT imaging of the chest was performed following the standard protocol without IV contrast. COMPARISON:  04/09/2016 FINDINGS: Scattered atherosclerotic calcifications aorta and coronary arteries. Upper normal caliber ascending thoracic aorta 3.7 cm transverse. Scattered normal sized mediastinal lymph nodes without definite adenopathy. Dilated main pulmonary artery. Questionable gastric wall thickening versus artifact from underdistention. Small amount of free fluid is seen in the upper abdomen perigastric. Fluid and probably small lymph nodes at porta hepatis and gastrohepatic ligament. Enlargement of pancreatic head, unable to exclude pancreatic mass of this could  all bliss of the seen with focal pancreatitis. Gallbladder surgically absent. Remaining visualized upper abdomen unremarkable. BILATERAL pleural effusions LEFT larger than RIGHT, LEFT decreased in size since the previous study post thoracentesis. No discrete pleural based mass identified by noncontrast exam. Significant compressive atelectasis in both lower lobes. Irregular LEFT lower lobe bronchus again identified with an area of nodular wall thickening on coronal imaging, unable to exclude endobronchial lesion. No additional lung abnormalities. Degenerative disc disease changes thoracic spine. IMPRESSION: Persistent bibasilar effusions and atelectasis LEFT greater than RIGHT despite preceding LEFT thoracentesis. Persistent endobronchial irregularity and suspected nodularity in the LEFT lower lobe bronchus, unable to exclude endobronchial lesion; recommend bronchoscopic assessment. Free fluid in upper abdomen with questionable gastric wall thickening versus artifact and enlargement of  pancreatic head question mass versus focal pancreatitis ; followup CT imaging of the abdomen with oral contrast and with/without IV contrast utilizing focus pancreatic protocol recommended to further assess these findings. Findings called to Dr. Luan Pulling on 04/12/2016 at 1415 hours. Electronically Signed   By: Lavonia Dana M.D.   On: 04/12/2016 14:19   US Thoracentesis Asp Pleural Space W/img Guide  04/12/2016  INDICATION: LEFT pleural effusion EXAM: ULTRASOUND GUIDED THERAPEUTIC LEFT THORACENTESIS PROCEDURE: Procedure, benefits, and risks of procedure were discussed with patient. Written informed consent for procedure was obtained. Time out protocol followed. Pleural effusion localized by ultrasound at the posterior LEFT hemithorax. Skin prepped and draped in usual sterile fashion. Skin and soft tissues anesthetized with 10 mL of 1% lidocaine. 8 French thoracentesis catheter placed into the LEFT pleural space. 1.63 L of dark old bloody fluid was aspirated by syringe pump. Procedure tolerated well by patient without immediate complication. No pneumothorax on postprocedural CT. FINDINGS: As above IMPRESSION: Successful ultrasound guided LEFT thoracentesis yielding 1.63 L of old bloody pleural fluid. Electronically Signed   By: Lavonia Dana M.D.   On: 04/12/2016 12:11     Scheduled Meds: . amLODipine  10 mg Oral QPM  . benazepril  40 mg Oral Daily  . docusate sodium  100 mg Oral BID  . febuxostat  80 mg Oral Daily  . finasteride  5 mg Oral Daily  . furosemide  40 mg Oral Daily  . hydrALAZINE  50 mg Oral TID  . insulin aspart  0-5 Units Subcutaneous QHS  . insulin aspart  0-9 Units Subcutaneous TID WC  . lipase/protease/amylase  12,000 Units Oral TID WC  . loratadine  10 mg Oral q1800  . metoprolol succinate  100 mg Oral Daily  . niacin  1,000 mg Oral QHS  . pantoprazole  40 mg Oral Daily  . potassium chloride  20 mEq Oral Daily  . pravastatin  40 mg Oral Daily  . sodium chloride flush  3 mL  Intravenous Q12H   Continuous Infusions: . 0.9 % NaCl with KCl 20 mEq / L 10 mL/hr at 04/12/16 1552     LOS: 5 days    Time spent: 25 minutes   Kathie Dike, MD [facility] Pager [phone]  If 7PM-7AM, please contact night-coverage [email or web address] Password [email or web address] 04/14/2016, 7:35 AM   By signing my name below, I, Delene Ruffini, attest that this documentation has been prepared under the direction and in the presence of Kathie Dike, MD. Electronically Signed: Delene Ruffini 04/14/2016 1:30pm  I, Dr. Kathie Dike, personally performed the services described in this documentaiton. All medical record entries made by the scribe were at my direction and in my presence. I have reviewed the chart and agree  that the record reflects my personal performance and is accurate and complete  Kathie Dike, MD, 04/14/2016 4:55 PM

## 2016-04-14 NOTE — Progress Notes (Signed)
ANTICOAGULATION CONSULT NOTE - Initial Consult  Pharmacy Consult for Coumadin Indication: h/o DVT  Allergies  Allergen Reactions  . Allopurinol Other (See Comments)    Significantly decreased WBC's  . Colchicine Other (See Comments)    Significantly decreased WBC's    Patient Measurements: Height: 5\' 5"  (165.1 cm) Weight: 147 lb 1.6 oz (66.724 kg) IBW/kg (Calculated) : 61.5 Heparin Dosing Weight:   Vital Signs: Temp: 98.2 F (36.8 C) (05/05 1300) Temp Source: Oral (05/05 1300) BP: 133/47 mmHg (05/05 1300) Pulse Rate: 77 (05/05 1300)  Labs:  Recent Labs  04/12/16 0515 04/13/16 0454 04/14/16 0443  HGB 12.0* 11.3* 11.0*  HCT 36.6* 33.3* 33.2*  PLT 413* 352 400  LABPROT 20.1* 16.9* 15.9*  INR 1.72* 1.36 1.25  CREATININE  --  1.07  --     Estimated Creatinine Clearance: 53.5 mL/min (by C-G formula based on Cr of 1.07).   Medical History: Past Medical History  Diagnosis Date  . Hypertension   . Heart murmur   . ED (erectile dysfunction)   . Agranulocytosis ([place])   . Diabetes mellitus   . Hyperlipidemia   . Gout   . Adenomatous colon polyp   . Prostate cancer ([place])     Followed by Dr. Jeffie Pollock   . Pancreatitis   . Gallstones   . Allergy   . Internal hemorrhoids   . Diverticulosis   . 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    Medications:  Scheduled:  . amLODipine  10 mg Oral QPM  . benazepril  40 mg Oral Daily  . docusate sodium  100 mg Oral BID  . enoxaparin (LOVENOX) injection  1 mg/kg Subcutaneous Q12H  . febuxostat  80 mg Oral Daily  . finasteride  5 mg Oral Daily  . furosemide  40 mg Oral Daily  . hydrALAZINE  50 mg Oral TID  . insulin aspart  0-5 Units Subcutaneous QHS  . insulin aspart  0-9 Units Subcutaneous TID WC  . lidocaine      . lipase/protease/amylase  12,000 Units Oral TID WC  . loratadine  10 mg Oral q1800  . metoprolol succinate  100 mg Oral Daily  .  niacin  1,000 mg Oral QHS  . pantoprazole  40 mg Oral Daily  . potassium chloride  20 mEq Oral Daily  . pravastatin  40 mg Oral Daily  . sodium chloride flush  3 mL Intravenous Q12H    Assessment: 75 yo male with h/o thrombosis of splenic artery 10/2015 which he is on Coumadin. Coumadin on hold for thoracentesis (done) and Dr Luan Pulling planning bronchoscopy on 5/5.  Pt is currently on full dose Lovenox Procedures completed, Coumadin to be restarted  Goal of Therapy:  INR 2-3 Monitor platelets by anticoagulation protocol: Yes   Plan:  Coumadin 6 mg po x 1 dose (home regiment) INR/PT daily Monitor CBC, platelets   Alan [name] 04/14/2016,4:40 PM

## 2016-04-15 LAB — CBC
HEMATOCRIT: 33.1 % — AB (ref 39.0–52.0)
HEMOGLOBIN: 11.1 g/dL — AB (ref 13.0–17.0)
MCH: 29 pg (ref 26.0–34.0)
MCHC: 33.5 g/dL (ref 30.0–36.0)
MCV: 86.4 fL (ref 78.0–100.0)
Platelets: 356 10*3/uL (ref 150–400)
RBC: 3.83 MIL/uL — AB (ref 4.22–5.81)
RDW: 16.7 % — ABNORMAL HIGH (ref 11.5–15.5)
WBC: 9.7 10*3/uL (ref 4.0–10.5)

## 2016-04-15 LAB — CULTURE, BODY FLUID W GRAM STAIN -BOTTLE

## 2016-04-15 LAB — PROTIME-INR
INR: 1.26 (ref 0.00–1.49)
Prothrombin Time: 15.9 seconds — ABNORMAL HIGH (ref 11.6–15.2)

## 2016-04-15 LAB — GLUCOSE, CAPILLARY
GLUCOSE-CAPILLARY: 122 mg/dL — AB (ref 65–99)
Glucose-Capillary: 120 mg/dL — ABNORMAL HIGH (ref 65–99)
Glucose-Capillary: 157 mg/dL — ABNORMAL HIGH (ref 65–99)
Glucose-Capillary: 113 mg/dL — ABNORMAL HIGH (ref 65–99)

## 2016-04-15 LAB — CULTURE, BODY FLUID-BOTTLE: CULTURE: NO GROWTH

## 2016-04-15 MED ORDER — WARFARIN - PHARMACIST DOSING INPATIENT
Status: DC
  Administered 2016-04-17 – 2016-04-19 (×2)

## 2016-04-15 MED ORDER — WARFARIN SODIUM 5 MG PO TABS
10.0000 mg | ORAL_TABLET | Freq: Once | ORAL | Status: AC
  Administered 2016-04-15: 10 mg via ORAL
  Filled 2016-04-15: qty 2

## 2016-04-15 NOTE — Progress Notes (Signed)
PROGRESS NOTE    [name]  [place]:[id number] DOB: 1941-10-17 DOA: 04/09/2016 PCP: Fransisca Kaufmann Dettinger, MD Outpatient Specialists:    Brief Narrative:  70 yom with a hx of acute necrotizing biliary pancreatitis in 2016 s/p cholecystectomy, prostate CA, HTN, and DM type 2, presented to the ED with complaints of SOB. CXR showed extensive abnormal opacity in the left thorax consistent with a large effusion and underlying consolidation. Contrasted CT showed large left pleural effusion with compressive atelectasis of the left lung, a small right pleural effusion.Thoracentesis has been performed and fluid cytology has been negative. Follow up CT scan shows persistent abnormality in the left bronchus. He underwent bronchoscopy on 5/5 and biopsy were done. He has been restarted on anticoagulation and can be discharged home once INR is therapeutic.   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 Large left pleural effusion with associated left lung compressive atelectasis and irregular left lower lobe bronchus. Patient has had 2 thoracentesis during his hospital stay. Initially done in the ER, thoracentesis revealed 850 mL of fluid. Second thoracentesis was done on 5/3 with removal of 1.6 L of fluid. Cytology of fluid negative. Follow-up CT scan of the chest indicated persistent left bronchus abnormalities. Dr. Luan Pulling performed a bronchoscopy and biopsy 5/05.  2. 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 BNP wnl. Initially patient had 2+ lower extremity edema.  ECHO showed EF 60-65% with grade 1 diastolic dysfunction. Continue oral Lasix. 3. Abnormal pancreas. S/p cholecystectomy.  Lipase modestly elevated but has improved. Amylase was also noted in pleural fluid. GI has been consulted, input appreciated. CT of chest done on admission indicated peripancreatic fluid density and poor enhancement of the pancreatic head worrisome for necrosis. Clinically, the patient was not complaining of any abdominal pain or vomiting. GI recommendations are to repeat CT scan in 4 weeks. 4. Embolism/ thrombosis of splenic vein. INR was slightly sub-therapeutic on admission. Patient was started on heparin infusion. INR was therapeutic on 5/2 and heparin was discontinued. Since that time, INR has trended down to normal range. Since patient has completed 6 months of anticoagulation therapy, recent images were reviewed with radiologist to determine if splenic vein had recannulized. After reviewing recent abdominal ultrasound, it was felt that his splenic vein was not patent. Case was discussed with hematology, Dr. Whitney Muse and it was recommended to continue the patient on anticoagulation for now, and follow up in hematology clinic for further management. He is on coumadin with lovenox bridge. He will need to stay in the hospital until INR is therapeutic. Currently INR is 1.26 5. Hypertension. Patient is on multiple antihypertensives. Blood pressure is currently stable.  6. Diabetes mellitus type 2. Continue to follow with sliding scale insulin. Restart metformin on discharge.   DVT prophylaxis: coumadin>>lovenox Code Status: Full code Family Communication: Discussed with patient and wife Disposition Plan: Discharge to home when clinically appropriate.   Consultants:   Pulmonology, Dr. Luan Pulling  Gastroenterology  Procedures:  Thoracentesis on 4/30 with removal of 850 mL of fluid Thoracentesis on 5/3 with removal of 1.6 L of fluid -2-D echocardiogram:Study Conclusions - Left ventricle: The cavity size was normal. Wall thickness was  increased  in a pattern of moderate to severe LVH. Systolic   function was normal. The estimated ejection fraction was in the  range of 60% to 65%. Wall motion was normal; there were no  regional wall motion abnormalities. Doppler parameters are  consistent with abnormal left ventricular relaxation (grade 1  diastolic dysfunction). - Aortic valve: Mildly calcified annulus. Trileaflet. There was  mild regurgitation. - Mitral valve: Calcified annulus. There was trivial regurgitation. - Left atrium: The atrium was mildly dilated. - Right atrium: Central venous pressure (est): 3 mm Hg. - Tricuspid valve: There was mild regurgitation. - Pulmonic valve: There was mild regurgitation. - Pulmonary arteries: PA peak pressure: 33 mm Hg (S). - Pericardium, extracardiac: A trivial pericardial effusion was  identified posterior to the heart. Bilateral pleural effusions   noted, largest on the left.  Subjective: No new complaints. No abdominal pain, shortness of breath  Objective: Filed Vitals:   04/14/16 1300 04/14/16 2128 04/15/16 0547 04/15/16 0548  BP: 133/47 139/49 130/55   Pulse: 77 74 80   Temp: 98.2 F (36.8 C) 99.5 F (37.5 C) 99.1 F (37.3 C)   TempSrc: Oral Oral Oral   Resp:   18   Height:      Weight:    68.765 kg (151 lb 9.6 oz)  SpO2: 97% 97% 98%     Intake/Output Summary (Last 24 hours) at 04/15/16 0746 Last data filed at 04/15/16 0547  Gross per 24 hour  Intake     50 ml  Output    800 ml  Net   -750 ml   Filed Weights   04/13/16 0559 04/14/16 0519 04/15/16 0548  Weight: 66.724 kg (147 lb 1.6 oz) 66.724 kg (147 lb 1.6 oz) 68.765 kg (151 lb 9.6 oz)    Examination:  General exam: Appears calm and comfortable  Respiratory system: Clear to auscultation. Respiratory effort normal. Cardiovascular system: S1 & S2 heard, RRR. No JVD, murmurs, rubs, gallops or clicks. No pedal edema. Gastrointestinal system: Abdomen is nondistended, soft and nontender. No organomegaly or masses felt. Normal bowel sounds heard. Central  nervous system: Alert and oriented. No focal neurological deficits. Extremities: Symmetric 5 x 5 power. Skin: No rashes, lesions or ulcers Psychiatry: Judgement and insight appear normal. Mood & affect appropriate.    Data Reviewed: I have personally reviewed following labs and imaging studies  CBC:  Recent Labs Lab 04/09/16 0945  04/11/16 0428 04/12/16 0515 04/13/16 0454 04/14/16 0443 04/15/16 0659  WBC 7.9  < > 8.5 11.0* 8.3 8.1 9.7  NEUTROABS 5.2  --   --   --   --   --   --   HGB 11.8*  < > 11.8* 12.0* 11.3* 11.0* 11.1*  HCT 35.8*  < > 35.9* 36.6* 33.3* 33.2* 33.1*  MCV 87.3  < > 87.1 86.7 86.9 88.1 86.4  PLT 415*  < > 389 413* 352 400 356  < > = values in this interval not displayed. Basic Metabolic Panel:  Recent Labs Lab 04/09/16 0945 04/10/16 0412 04/11/16 0428 04/13/16 0454  NA 142 142 137 137  K 3.3* 3.7 3.7 3.7  CL 104 104 104 103  CO2 27 28 27 26   GLUCOSE 112* 101* 117* 104*  BUN 23* 18 13 18   CREATININE 1.08 1.05 1.00 1.07  CALCIUM 8.8* 8.8* 8.6* 8.7*  MG  --  1.7  --   --    GFR: Estimated Creatinine Clearance: 53.5 mL/min (by C-G formula based on Cr of 1.07).  Liver Function Tests:  Recent Labs Lab 04/09/16 0945 04/10/16 0412  AST 21 22  ALT 17 17  ALKPHOS 81 81  BILITOT 0.9 1.4*  PROT 6.5 6.2*  ALBUMIN 2.9* 2.8*    Recent Labs Lab 04/09/16 0945 04/10/16 0412  LIPASE 170* 128*  AMYLASE  --  232*   No results for input(s): AMMONIA in the last 168 hours. Coagulation Profile:  Recent Labs Lab 04/11/16 0428 04/12/16 0515 04/13/16 0454 04/14/16 0443 04/15/16 0659  INR 2.36* 1.72* 1.36 1.25 1.26   Cardiac Enzymes:  Recent Labs Lab 04/09/16 0945 04/10/16 0412  TROPONINI 0.03 0.03   BNP (last 3 results) No results for input(s): PROBNP in the last 8760 hours. HbA1C: No results for input(s): HGBA1C in the last 72 hours. CBG:  Recent Labs Lab 04/14/16 0747 04/14/16 0917 04/14/16 1146 04/14/16 1605 04/14/16 2125    GLUCAP 106* 97 102* 140* 101*   Lipid Profile: No results for input(s): CHOL, HDL, LDLCALC, TRIG, CHOLHDL, LDLDIRECT in the last 72 hours. Thyroid Function Tests: No results for input(s): TSH, T4TOTAL, FREET4, T3FREE, THYROIDAB in the last 72 hours. Anemia Panel: No results for input(s): VITAMINB12, FOLATE, FERRITIN, TIBC, IRON, RETICCTPCT in the last 72 hours. Urine analysis: No results found for: COLORURINE, APPEARANCEUR, LABSPEC, [place], GLUCOSEU, HGBUR, BILIRUBINUR, [place], [place], [place], NITRITE, LEUKOCYTESUR Sepsis Labs: @LABRCNTIP (procalcitonin:4,lacticidven:4)  ) Recent Results (from the past 240 hour(s))  Fungus Culture With Stain (Not @ [facility])     Status: None (Preliminary result)   Collection Time: 04/09/16 12:59 PM  Result Value Ref Range Status   Fungus Stain Final report  Final    Comment: (NOTE) Performed At: [facility] [place], [place] [id number] Lindon Romp MD [id number]    Fungus (Mycology) Culture PENDING  Incomplete   Fungal Source PLEURAL  Final  Culture, body fluid-bottle     Status: None   Collection Time: 04/09/16 12:59 PM  Result Value Ref Range Status   Specimen Description FLUID PLEURAL  Final   Special Requests BOTTLES DRAWN AEROBIC AND ANAEROBIC 5CC EACH  Final   Culture NO GROWTH 6 DAYS  Final   Report Status 04/15/2016 FINAL  Final  Fungus Culture Result     Status: None   Collection Time: 04/09/16 12:59 PM  Result Value Ref Range Status   Result 1 Comment  Final    Comment: (NOTE) KOH/Calcofluor preparation:  no fungus observed. Performed At: [facility] [place], [place] [id number] Lindon Romp MD [id number]   MRSA PCR Screening     Status: None   Collection Time: 04/09/16  1:44 PM  Result Value Ref Range Status   MRSA by PCR NEGATIVE NEGATIVE Final    Comment:        The GeneXpert MRSA Assay (FDA approved for NASAL specimens only), is one component of  a comprehensive MRSA colonization surveillance program. It is not intended to diagnose MRSA infection nor to guide or monitor treatment for MRSA infections.       Radiology Studies: No results found.   Scheduled Meds: . amLODipine  10 mg Oral QPM  . benazepril  40 mg Oral Daily  . docusate sodium  100 mg Oral BID  . enoxaparin (LOVENOX) injection  1 mg/kg Subcutaneous Q12H  . febuxostat  80 mg Oral Daily  . finasteride  5 mg Oral Daily  . furosemide  40 mg Oral Daily  . hydrALAZINE  50 mg Oral TID  . insulin aspart  0-5 Units Subcutaneous QHS  . insulin aspart  0-9 Units Subcutaneous TID WC  . lipase/protease/amylase  12,000 Units Oral TID WC  . loratadine  10 mg Oral q1800  . metoprolol succinate  100 mg Oral Daily  . niacin  1,000 mg Oral QHS  . pantoprazole  40 mg Oral Daily  . potassium chloride  20 mEq Oral Daily  . pravastatin  40 mg Oral Daily  . sodium chloride flush  3 mL Intravenous Q12H  . Warfarin - Pharmacist Dosing Inpatient   Does not apply q1800   Continuous Infusions: . 0.9 % NaCl with KCl 20 mEq / L 10 mL/hr at 04/12/16 1552     LOS: 6 days    Time spent: 25 minutes   Kathie Dike, MD [facility] Pager [phone]  If 7PM-7AM, please contact night-coverage [email or web address] Password [email or web address] 04/15/2016, 7:46 AM

## 2016-04-15 NOTE — Progress Notes (Signed)
He says he feels okay. He tolerated bronchoscopy well. I will plan to sign off at this point. Thanks for allowing me to see him with you

## 2016-04-15 NOTE — Progress Notes (Signed)
[place] for Coumadin Indication: h/o DVT  Allergies  Allergen Reactions  . Allopurinol Other (See Comments)    Significantly decreased WBC's  . Colchicine Other (See Comments)    Significantly decreased WBC's    Patient Measurements: Height: 5\' 5"  (165.1 cm) Weight: 151 lb 9.6 oz (68.765 kg) IBW/kg (Calculated) : 61.5 Heparin Dosing Weight:   Vital Signs: Temp: 99.1 F (37.3 C) (05/06 0547) Temp Source: Oral (05/06 0547) BP: 143/65 mmHg (05/06 0854) Pulse Rate: 103 (05/06 0854)  Labs:  Recent Labs  04/13/16 0454 04/14/16 0443 04/15/16 0659  HGB 11.3* 11.0* 11.1*  HCT 33.3* 33.2* 33.1*  PLT 352 400 356  LABPROT 16.9* 15.9* 15.9*  INR 1.36 1.25 1.26  CREATININE 1.07  --   --     Estimated Creatinine Clearance: 53.5 mL/min (by C-G formula based on Cr of 1.07).   Medical History: Past Medical History  Diagnosis Date  . Hypertension   . Heart murmur   . ED (erectile dysfunction)   . Agranulocytosis ([place])   . Diabetes mellitus   . Hyperlipidemia   . Gout   . Adenomatous colon polyp   . Prostate cancer ([place])     Followed by Dr. Jeffie Pollock   . Pancreatitis   . Gallstones   . Allergy   . Internal hemorrhoids   . Diverticulosis   . 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    Medications:  Scheduled:  . amLODipine  10 mg Oral QPM  . benazepril  40 mg Oral Daily  . docusate sodium  100 mg Oral BID  . enoxaparin (LOVENOX) injection  1 mg/kg Subcutaneous Q12H  . febuxostat  80 mg Oral Daily  . finasteride  5 mg Oral Daily  . furosemide  40 mg Oral Daily  . hydrALAZINE  50 mg Oral TID  . insulin aspart  0-5 Units Subcutaneous QHS  . insulin aspart  0-9 Units Subcutaneous TID WC  . lipase/protease/amylase  12,000 Units Oral TID WC  . loratadine  10 mg Oral q1800  . metoprolol succinate  100 mg Oral Daily  . niacin  1,000 mg Oral QHS  .  pantoprazole  40 mg Oral Daily  . potassium chloride  20 mEq Oral Daily  . pravastatin  40 mg Oral Daily  . sodium chloride flush  3 mL Intravenous Q12H  . warfarin  10 mg Oral Once  . Warfarin - Pharmacist Dosing Inpatient   Does not apply Q24H    Assessment: 75 yo male with h/o thrombosis of splenic artery 10/2015 which he is on Coumadin. Coumadin on hold for thoracentesis (done) and Dr Luan Pulling planning bronchoscopy on 5/5.  Pt is currently on full dose Lovenox Procedures completed, Coumadin to be restarted INR sub therapeutic this AM, only slight increase  Goal of Therapy:  INR 2-3 Monitor platelets by anticoagulation protocol: Yes   Plan:  Coumadin 10 mg po x 1 dose INR/PT daily Monitor CBC, platelets   Alan [name] 04/15/2016,10:35 AM

## 2016-04-16 LAB — GLUCOSE, CAPILLARY
GLUCOSE-CAPILLARY: 101 mg/dL — AB (ref 65–99)
GLUCOSE-CAPILLARY: 128 mg/dL — AB (ref 65–99)
Glucose-Capillary: 102 mg/dL — ABNORMAL HIGH (ref 65–99)
Glucose-Capillary: 134 mg/dL — ABNORMAL HIGH (ref 65–99)

## 2016-04-16 LAB — CBC
HCT: 30.8 % — ABNORMAL LOW (ref 39.0–52.0)
Hemoglobin: 10.3 g/dL — ABNORMAL LOW (ref 13.0–17.0)
MCH: 29.3 pg (ref 26.0–34.0)
MCHC: 33.4 g/dL (ref 30.0–36.0)
MCV: 87.7 fL (ref 78.0–100.0)
PLATELETS: 379 10*3/uL (ref 150–400)
RBC: 3.51 MIL/uL — AB (ref 4.22–5.81)
RDW: 16.8 % — ABNORMAL HIGH (ref 11.5–15.5)
WBC: 7.3 10*3/uL (ref 4.0–10.5)

## 2016-04-16 LAB — PROTIME-INR
INR: 1.44 (ref 0.00–1.49)
PROTHROMBIN TIME: 17.6 s — AB (ref 11.6–15.2)

## 2016-04-16 MED ORDER — WARFARIN SODIUM 5 MG PO TABS
10.0000 mg | ORAL_TABLET | Freq: Once | ORAL | Status: AC
  Administered 2016-04-16: 10 mg via ORAL
  Filled 2016-04-16: qty 2

## 2016-04-16 NOTE — Progress Notes (Signed)
PROGRESS NOTE    Alan [name].  [id number] DOB: 09-25-1941 DOA: 04/09/2016 PCP: Fransisca Kaufmann Dettinger, MD Outpatient Specialists:    Brief Narrative:  30 yom with a hx of acute necrotizing biliary pancreatitis in 2016 s/p cholecystectomy, prostate CA, HTN, and DM type 2, presented to the ED with complaints of SOB. CXR showed extensive abnormal opacity in the left thorax consistent with a large effusion and underlying consolidation. Contrasted CT showed large left pleural effusion with compressive atelectasis of the left lung, a small right pleural effusion.Thoracentesis has been performed and fluid cytology has been negative. Follow up CT scan shows persistent abnormality in the left bronchus. He underwent bronchoscopy on 5/5 and biopsy were done. He has been restarted on anticoagulation and can be discharged home once INR is therapeutic.   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 Large left pleural effusion with associated left lung compressive atelectasis and irregular left lower lobe bronchus. Patient has had 2 thoracentesis during his hospital stay. Initially done in the ER, thoracentesis removed 850 mL of fluid. Second thoracentesis was done on 5/3 with removal of 1.6 L of fluid. Cytology of fluid negative. Follow-up CT scan of the chest indicated persistent left bronchus abnormalities. Dr. Luan Pulling performed a bronchoscopy and biopsy 5/05.  2. 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 BNP wnl. Initially patient had 2+ lower extremity edema.  ECHO showed EF 60-65% with grade 1 diastolic dysfunction. Continue oral Lasix. 3. Abnormal pancreas. S/p cholecystectomy.  Lipase modestly elevated but has improved. Amylase was also noted in pleural fluid. GI has been consulted, input appreciated. CT of chest done on admission indicated peripancreatic fluid density and poor enhancement of the pancreatic head worrisome for necrosis. Clinically, the patient was not complaining of any abdominal pain or vomiting. GI recommendations are to repeat CT scan in 4 weeks. 4. Embolism/ thrombosis of splenic vein. INR was slightly sub-therapeutic on admission. Patient was started on heparin infusion. INR was therapeutic on 5/2 and heparin was discontinued. Since that time, INR has trended down to normal range. Since patient has completed 6 months of anticoagulation therapy, recent images were reviewed with radiologist to determine if splenic vein had recannulized. After reviewing recent abdominal ultrasound, it was felt that his splenic vein was not patent. Case was discussed with hematology, Dr. Whitney Muse and it was recommended to continue the patient on anticoagulation for now, and follow up in hematology clinic for further management. He is on coumadin with lovenox bridge. He will need to stay in the hospital until INR is therapeutic. Currently INR is 1.44 5. Hypertension. Patient is on multiple antihypertensives. Blood pressure is currently stable.  6. Diabetes mellitus type 2. Continue to follow with sliding scale insulin. Restart metformin on discharge.   DVT prophylaxis: coumadin>>lovenox Code Status: Full code Family Communication: Discussed with patient and wife Disposition Plan: Discharge to home when clinically appropriate.   Consultants:   Pulmonology, Dr. Luan Pulling  Gastroenterology  Procedures:  Thoracentesis on 4/30 with removal of 850 mL of fluid Thoracentesis on 5/3 with removal of 1.6 L of fluid -2-D echocardiogram:Study Conclusions - Left ventricle: The cavity size was normal. Wall thickness was  increased  in a pattern of moderate to severe LVH. Systolic   function was normal. The estimated ejection fraction was in the  range of 60% to 65%. Wall motion was normal; there were no  regional wall motion abnormalities. Doppler parameters are  consistent with abnormal left ventricular relaxation (grade 1  diastolic dysfunction). - Aortic valve: Mildly calcified annulus. Trileaflet. There was  mild regurgitation. - Mitral valve: Calcified annulus. There was trivial regurgitation. - Left atrium: The atrium was mildly dilated. - Right atrium: Central venous pressure (est): 3 mm Hg. - Tricuspid valve: There was mild regurgitation. - Pulmonic valve: There was mild regurgitation. - Pulmonary arteries: PA peak pressure: 33 mm Hg (S). - Pericardium, extracardiac: A trivial pericardial effusion was  identified posterior to the heart. Bilateral pleural effusions   noted, largest on the left.  Subjective: Feeling well. Denies any pain or SOB.   Objective: Filed Vitals:   04/15/16 1554 04/15/16 1729 04/15/16 2046 04/16/16 0544  BP: 120/57 140/57 138/62 131/52  Pulse: 75 81 76 72  Temp: 98.2 F (36.8 C)  98.1 F (36.7 C) 99.1 F (37.3 C)  TempSrc: Oral  Oral Oral  Resp: 18 18 18 18   Height:      Weight:    66.497 kg (146 lb 9.6 oz)  SpO2: 98% 98% 97% 98%    Intake/Output Summary (Last 24 hours) at 04/16/16 0812 Last data filed at 04/16/16 0700  Gross per 24 hour  Intake   2689 ml  Output   1450 ml  Net   1239 ml   Filed Weights   04/14/16 0519 04/15/16 0548 04/16/16 0544  Weight: 66.724 kg (147 lb 1.6 oz) 68.765 kg (151 lb 9.6 oz) 66.497 kg (146 lb 9.6 oz)    Examination:  General exam: Appears calm and comfortable  Respiratory system: Clear to auscultation. Respiratory effort normal. Cardiovascular system: S1 & S2 heard, RRR. No JVD, murmurs, rubs, gallops or clicks. No pedal edema. Gastrointestinal system: Abdomen is nondistended, soft and nontender. No organomegaly or masses felt. Normal bowel sounds heard. Central nervous  system: Alert and oriented. No focal neurological deficits. Extremities: Symmetric 5 x 5 power. Skin: No rashes, lesions or ulcers Psychiatry: Judgement and insight appear normal. Mood & affect appropriate.    Data Reviewed: I have personally reviewed following labs and imaging studies  CBC:  Recent Labs Lab 04/09/16 0945  04/12/16 0515 04/13/16 0454 04/14/16 0443 04/15/16 0659 04/16/16 0556  WBC 7.9  < > 11.0* 8.3 8.1 9.7 7.3  NEUTROABS 5.2  --   --   --   --   --   --   HGB 11.8*  < > 12.0* 11.3* 11.0* 11.1* 10.3*  HCT 35.8*  < > 36.6* 33.3* 33.2* 33.1* 30.8*  MCV 87.3  < > 86.7 86.9 88.1 86.4 87.7  PLT 415*  < > 413* 352 400 356 379  < > = values in this interval not displayed. Basic Metabolic Panel:  Recent Labs Lab 04/09/16 0945 04/10/16 0412 04/11/16 0428 04/13/16 0454  NA 142 142 137 137  K 3.3* 3.7 3.7 3.7  CL 104 104 104 103  CO2 27 28 27 26   GLUCOSE 112* 101* 117* 104*  BUN 23* 18 13 18   CREATININE 1.08 1.05 1.00 1.07  CALCIUM 8.8* 8.8* 8.6* 8.7*  MG  --  1.7  --   --    Liver Function Tests:  Recent Labs Lab 04/09/16 0945 04/10/16 0412  AST 21 22  ALT 17  17  ALKPHOS 81 81  BILITOT 0.9 1.4*  PROT 6.5 6.2*  ALBUMIN 2.9* 2.8*    Recent Labs Lab 04/09/16 0945 04/10/16 0412  LIPASE 170* 128*  AMYLASE  --  232*   Coagulation Profile:  Recent Labs Lab 04/12/16 0515 04/13/16 0454 04/14/16 0443 04/15/16 0659 04/16/16 0556  INR 1.72* 1.36 1.25 1.26 1.44   Cardiac Enzymes:  Recent Labs Lab 04/09/16 0945 04/10/16 0412  TROPONINI 0.03 0.03   CBG:  Recent Labs Lab 04/15/16 0748 04/15/16 1119 04/15/16 1654 04/15/16 2049 04/16/16 0748  GLUCAP 120* 157* 122* 113* 101*    ) Recent Results (from the past 240 hour(s))  Fungus Culture With Stain (Not @ [facility])     Status: None (Preliminary result)   Collection Time: 04/09/16 12:59 PM  Result Value Ref Range Status   Fungus Stain Final report  Final    Comment: (NOTE) Performed  At: [facility] [address] [place], [place] [id number] Lindon Romp MD [id number]    Fungus (Mycology) Culture PENDING  Incomplete   Fungal Source PLEURAL  Final  Culture, body fluid-bottle     Status: None   Collection Time: 04/09/16 12:59 PM  Result Value Ref Range Status   Specimen Description FLUID PLEURAL  Final   Special Requests BOTTLES DRAWN AEROBIC AND ANAEROBIC 5CC EACH  Final   Culture NO GROWTH 6 DAYS  Final   Report Status 04/15/2016 FINAL  Final  Fungus Culture Result     Status: None   Collection Time: 04/09/16 12:59 PM  Result Value Ref Range Status   Result 1 Comment  Final    Comment: (NOTE) KOH/Calcofluor preparation:  no fungus observed. Performed At: [facility] [place], [place] [id number] Lindon Romp MD [id number]   MRSA PCR Screening     Status: None   Collection Time: 04/09/16  1:44 PM  Result Value Ref Range Status   MRSA by PCR NEGATIVE NEGATIVE Final    Comment:        The GeneXpert MRSA Assay (FDA approved for NASAL specimens only), is one component of a comprehensive MRSA colonization surveillance program. It is not intended to diagnose MRSA infection nor to guide or monitor treatment for MRSA infections.       Scheduled Meds: . amLODipine  10 mg Oral QPM  . benazepril  40 mg Oral Daily  . docusate sodium  100 mg Oral BID  . enoxaparin (LOVENOX) injection  1 mg/kg Subcutaneous Q12H  . febuxostat  80 mg Oral Daily  . finasteride  5 mg Oral Daily  . furosemide  40 mg Oral Daily  . hydrALAZINE  50 mg Oral TID  . insulin aspart  0-5 Units Subcutaneous QHS  . insulin aspart  0-9 Units Subcutaneous TID WC  . lipase/protease/amylase  12,000 Units Oral TID WC  . loratadine  10 mg Oral q1800  . metoprolol succinate  100 mg Oral Daily  . niacin  1,000 mg Oral QHS  . pantoprazole  40 mg Oral Daily  . potassium chloride  20 mEq Oral Daily  . pravastatin  40 mg Oral Daily  . sodium chloride  flush  3 mL Intravenous Q12H  . Warfarin - Pharmacist Dosing Inpatient   Does not apply Q24H   Continuous Infusions: . 0.9 % NaCl with KCl 20 mEq / L 10 mL/hr at 04/12/16 1552     LOS: 7 days    Time spent: 25 minutes   [name],  MD [facility] Pager [phone]  If 7PM-7AM, please contact night-coverage [email or web address] Password [email or web address] 04/16/2016, 8:12 AM    By signing my name below, I, Rosalie Doctor, attest that this documentation has been prepared under the direction and in the presence of [facility]. MD Electronically Signed: Rosalie Doctor, Scribe. 04/16/2016 11:55am  I, Dr. Kathie Dike, personally performed the services described in this documentaiton. All medical record entries made by the scribe were at my direction and in my presence. I have reviewed the chart and agree that the record reflects my personal performance and is accurate and complete  Kathie Dike, MD, 04/16/2016 11:58 AM

## 2016-04-16 NOTE — Progress Notes (Signed)
[place] for Coumadin Indication: h/o DVT  Allergies  Allergen Reactions  . Allopurinol Other (See Comments)    Significantly decreased WBC's  . Colchicine Other (See Comments)    Significantly decreased WBC's    Patient Measurements: Height: 5\' 5"  (165.1 cm) Weight: 146 lb 9.6 oz (66.497 kg) IBW/kg (Calculated) : 61.5 Heparin Dosing Weight:   Vital Signs: Temp: 99.1 F (37.3 C) (05/07 0544) Temp Source: Oral (05/07 0544) BP: 131/52 mmHg (05/07 0544) Pulse Rate: 72 (05/07 0544)  Labs:  Recent Labs  04/14/16 0443 04/15/16 0659 04/16/16 0556  HGB 11.0* 11.1* 10.3*  HCT 33.2* 33.1* 30.8*  PLT 400 356 379  LABPROT 15.9* 15.9* 17.6*  INR 1.25 1.26 1.44    Estimated Creatinine Clearance: 53.5 mL/min (by C-G formula based on Cr of 1.07).   Medical History: Past Medical History  Diagnosis Date  . Hypertension   . Heart murmur   . ED (erectile dysfunction)   . Agranulocytosis ([place])   . Diabetes mellitus   . Hyperlipidemia   . Gout   . Adenomatous colon polyp   . Prostate cancer ([place])     Followed by Dr. Jeffie Pollock   . Pancreatitis   . Gallstones   . Allergy   . Internal hemorrhoids   . Diverticulosis   . 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    Medications:  Scheduled:  . amLODipine  10 mg Oral QPM  . benazepril  40 mg Oral Daily  . docusate sodium  100 mg Oral BID  . enoxaparin (LOVENOX) injection  1 mg/kg Subcutaneous Q12H  . febuxostat  80 mg Oral Daily  . finasteride  5 mg Oral Daily  . furosemide  40 mg Oral Daily  . hydrALAZINE  50 mg Oral TID  . insulin aspart  0-5 Units Subcutaneous QHS  . insulin aspart  0-9 Units Subcutaneous TID WC  . lipase/protease/amylase  12,000 Units Oral TID WC  . loratadine  10 mg Oral q1800  . metoprolol succinate  100 mg Oral Daily  . niacin  1,000 mg Oral QHS  . pantoprazole  40 mg Oral Daily  .  potassium chloride  20 mEq Oral Daily  . pravastatin  40 mg Oral Daily  . sodium chloride flush  3 mL Intravenous Q12H  . 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 Coumadin on hold for thoracentesis (done) and Dr Luan Pulling planning bronchoscopy on 5/5.  Pt is currently on full dose Lovenox Procedures completed, Coumadin to be restarted INR sub therapeutic this AM, slight rise this AM  Goal of Therapy:  INR 2-3 Monitor platelets by anticoagulation protocol: Yes   Plan:  Coumadin 10 mg po x 1 dose INR/PT daily Monitor CBC, platelets   Augusta Mirkin Bennett 04/16/2016,10:14 AM

## 2016-04-17 ENCOUNTER — Encounter: Payer: Self-pay | Admitting: Pharmacist

## 2016-04-17 LAB — COMPREHENSIVE METABOLIC PANEL
ALT: 38 U/L (ref 17–63)
AST: 48 U/L — ABNORMAL HIGH (ref 15–41)
Albumin: 2.8 g/dL — ABNORMAL LOW (ref 3.5–5.0)
Alkaline Phosphatase: 101 U/L (ref 38–126)
Anion gap: 9 (ref 5–15)
BUN: 21 mg/dL — ABNORMAL HIGH (ref 6–20)
CHLORIDE: 98 mmol/L — AB (ref 101–111)
CO2: 23 mmol/L (ref 22–32)
CREATININE: 1.23 mg/dL (ref 0.61–1.24)
Calcium: 8.7 mg/dL — ABNORMAL LOW (ref 8.9–10.3)
GFR calc non Af Amer: 56 mL/min — ABNORMAL LOW (ref >60)
Glucose, Bld: 166 mg/dL — ABNORMAL HIGH (ref 65–99)
Potassium: 4.6 mmol/L (ref 3.5–5.1)
SODIUM: 130 mmol/L — AB (ref 135–145)
Total Bilirubin: 1.2 mg/dL (ref 0.3–1.2)
Total Protein: 6.8 g/dL (ref 6.5–8.1)

## 2016-04-17 LAB — GLUCOSE, CAPILLARY
GLUCOSE-CAPILLARY: 171 mg/dL — AB (ref 65–99)
Glucose-Capillary: 135 mg/dL — ABNORMAL HIGH (ref 65–99)
Glucose-Capillary: 111 mg/dL — ABNORMAL HIGH (ref 65–99)
Glucose-Capillary: 148 mg/dL — ABNORMAL HIGH (ref 65–99)

## 2016-04-17 LAB — PROTIME-INR
INR: 1.38 (ref 0.00–1.49)
Prothrombin Time: 17.1 seconds — ABNORMAL HIGH (ref 11.6–15.2)

## 2016-04-17 LAB — LIPASE, BLOOD: Lipase: 102 U/L — ABNORMAL HIGH (ref 11–51)

## 2016-04-17 MED ORDER — MILK AND MOLASSES ENEMA
1.0000 | Freq: Once | RECTAL | Status: AC
  Administered 2016-04-17: 250 mL via RECTAL

## 2016-04-17 MED ORDER — WARFARIN SODIUM 5 MG PO TABS
10.0000 mg | ORAL_TABLET | Freq: Once | ORAL | Status: AC
  Administered 2016-04-17: 10 mg via ORAL
  Filled 2016-04-17: qty 2

## 2016-04-17 MED ORDER — METOPROLOL TARTRATE 5 MG/5ML IV SOLN
5.0000 mg | Freq: Once | INTRAVENOUS | Status: AC
  Administered 2016-04-17: 5 mg via INTRAVENOUS
  Filled 2016-04-17: qty 5

## 2016-04-17 NOTE — Care Management Note (Signed)
Case Management Note  Patient Details  Name: Alan [name]. MRN: [id number] Date of Birth: 02-15-1941  Subjective/Objective: Patient up and ambulating in room with spouse present. Stated that he is ready to be discharged.  Anticipate discharge soon.               Action/Plan: Home with self care.   Expected Discharge Date:                  Expected Discharge Plan:  Home/Self Care  In-House Referral:     Discharge planning Services  CM Consult  Post Acute Care Choice:    Choice offered to:     DME Arranged:    DME Agency:  [place]:    [place] Agency:  [place]  Status of Service:  Completed, signed off  Medicare Important Message Given:  Yes Date Medicare IM Given:    Medicare IM give by:    Date Additional Medicare IM Given:    Additional Medicare Important Message give by:     If discussed at [place] of Stay Meetings, dates discussed:    Additional Comments:  Alvie Heidelberg, RN 04/17/2016, 1:00 PM

## 2016-04-17 NOTE — Care Management Important Message (Signed)
Important Message  Patient Details  Name: Alan [name]. MRN: [id number] Date of Birth: 06-Mar-1941   Medicare Important Message Given:  Yes    Alvie Heidelberg, RN 04/17/2016, 1:00 PM

## 2016-04-17 NOTE — Progress Notes (Signed)
PROGRESS NOTE    Alan [name].  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 CXR showed extensive abnormal opacity in the left thorax consistent with a large effusion and underlying consolidation. Contrasted CT showed large left pleural effusion with compressive atelectasis of the left lung, a small right pleural effusion.Thoracentesis has been performed and fluid cytology has been negative. Follow up CT scan shows persistent abnormality in the left bronchus. He underwent bronchoscopy on 5/5 and biopsy were done. He has been restarted on anticoagulation and can be discharged home once INR is therapeutic.   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 Large left pleural effusion with associated left lung compressive atelectasis and irregular left lower lobe bronchus. Patient has had 2 thoracentesis during his hospital stay. Initially done in the ER, thoracentesis removed 850 mL of fluid. Second thoracentesis was done on 5/3 with removal of 1.6 L of fluid. Cytology of fluid negative. Follow-up CT scan of the chest indicated persistent left bronchus abnormalities. Dr. Luan Pulling performed a bronchoscopy and biopsy 5/05.  2. 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 BNP wnl. Initially patient had 2+ lower extremity edema.  ECHO showed EF 60-65% with grade 1 diastolic dysfunction. Continue oral Lasix. 3. Abnormal pancreas. S/p cholecystectomy.  Lipase modestly elevated but has improved. Amylase was also noted in pleural fluid. GI has been consulted, input appreciated. CT of chest done on admission indicated peripancreatic fluid density and poor enhancement of the pancreatic head worrisome for necrosis. Clinically, the patient was not complaining of any abdominal pain or vomiting. GI recommendations are to repeat CT scan in 4 weeks. 4. Embolism/ thrombosis of splenic vein. INR was slightly sub-therapeutic on admission. Patient was started on heparin infusion. INR was therapeutic on 5/2 and heparin was discontinued. Since that time, INR has trended down to normal range. Since patient has completed 6 months of anticoagulation therapy, recent images were reviewed with radiologist to determine if splenic vein had recannulized. After reviewing recent abdominal ultrasound, it was felt that his splenic vein was not patent. Case was discussed with hematology, Dr. Whitney Muse and it was recommended to continue the patient on anticoagulation for now, and follow up in hematology clinic for further management. He is on coumadin with lovenox bridge. He will need to stay in the hospital until INR is therapeutic. Currently INR is 1.38 5. Hypertension. Patient is on multiple antihypertensives. Blood pressure is currently stable.  6. Diabetes mellitus type 2. Continue to follow with sliding scale insulin. Restart metformin on discharge. 7. Abdominal pain. Will check liver enzymes and lipase. Will give enema, incase this is related to constipation. Treat with pain medications.   DVT prophylaxis: coumadin>>lovenox Code Status: Full code Family Communication: Discussed with patient and wife Disposition Plan: Discharge to home when clinically appropriate.   Consultants:   Pulmonology, Dr. Luan Pulling  Gastroenterology  Procedures:  Thoracentesis on 4/30 with removal of 850 mL of fluid Thoracentesis on 5/3 with  removal of 1.6 L of fluid -2-D echocardiogram:Study  Conclusions - Left ventricle: The cavity size was normal. Wall thickness was  increased in a pattern of moderate to severe LVH. Systolic  function was normal. The estimated ejection fraction was in the  range of 60% to 65%. Wall motion was normal; there were no  regional wall motion abnormalities. Doppler parameters are  consistent with abnormal left ventricular relaxation (grade 1  diastolic dysfunction). - Aortic valve: Mildly calcified annulus. Trileaflet. There was  mild regurgitation. - Mitral valve: Calcified annulus. There was trivial regurgitation. - Left atrium: The atrium was mildly dilated. - Right atrium: Central venous pressure (est): 3 mm Hg. - Tricuspid valve: There was mild regurgitation. - Pulmonic valve: There was mild regurgitation. - Pulmonary arteries: PA peak pressure: 33 mm Hg (S). - Pericardium, extracardiac: A trivial pericardial effusion was  identified posterior to the heart. Bilateral pleural effusions   noted, largest on the left.  Subjective: Complains of abdominal pain that began yesterday evening. No vomiting. He is passing flatus. Last bowel movement was 2 days ago. Pain is in epigastrium. No appetite  Objective: Filed Vitals:   04/16/16 1618 04/16/16 2253 04/17/16 0618 04/17/16 0950  BP: 136/58 149/55 143/54 152/60  Pulse: 71 68 84 84  Temp: 97.7 F (36.5 C) 98.2 F (36.8 C) 98.1 F (36.7 C)   TempSrc: Oral Oral Oral   Resp: 18 20 20    Height:      Weight:   65.772 kg (145 lb)   SpO2: 100% 98% 97%     Intake/Output Summary (Last 24 hours) at 04/17/16 1239 Last data filed at 04/17/16 1127  Gross per 24 hour  Intake 346.67 ml  Output   1800 ml  Net -1453.33 ml   Filed Weights   04/15/16 0548 04/16/16 0544 04/17/16 0618  Weight: 68.765 kg (151 lb 9.6 oz) 66.497 kg (146 lb 9.6 oz) 65.772 kg (145 lb)    Examination:  General exam: Appears calm and comfortable  Respiratory system: Clear to auscultation. Respiratory effort  normal. Cardiovascular system: S1 & S2 heard, RRR. No JVD, murmurs, rubs, gallops or clicks. No pedal edema. Gastrointestinal system: Abdomen is nondistended, soft, tender in epigastrium and RUQ. No organomegaly or masses felt. Normal bowel sounds heard. Central nervous system: Alert and oriented. No focal neurological deficits. Extremities: Symmetric 5 x 5 power. Skin: No rashes, lesions or ulcers Psychiatry: Judgement and insight appear normal. Mood & affect appropriate.    Data Reviewed: I have personally reviewed following labs and imaging studies  CBC:  Recent Labs Lab 04/12/16 0515 04/13/16 0454 04/14/16 0443 04/15/16 0659 04/16/16 0556  WBC 11.0* 8.3 8.1 9.7 7.3  HGB 12.0* 11.3* 11.0* 11.1* 10.3*  HCT 36.6* 33.3* 33.2* 33.1* 30.8*  MCV 86.7 86.9 88.1 86.4 87.7  PLT 413* 352 400 356 [id number]   Basic Metabolic Panel:  Recent Labs Lab 04/11/16 0428 04/13/16 0454  NA 137 137  K 3.7 3.7  CL 104 103  CO2 27 26  GLUCOSE 117* 104*  BUN 13 18  CREATININE 1.00 1.07  CALCIUM 8.6* 8.7*   Liver Function Tests: No results for input(s): AST, ALT, ALKPHOS, BILITOT, PROT, ALBUMIN in the last 168 hours. No results for input(s): LIPASE, AMYLASE in the last 168 hours. Coagulation Profile:  Recent Labs Lab 04/13/16 0454 04/14/16 0443 04/15/16 0659 04/16/16 0556 04/17/16 0445  INR 1.36 1.25 1.26 1.44 1.38   Cardiac Enzymes: No results for input(s): CKTOTAL, CKMB, CKMBINDEX, TROPONINI in the  last 168 hours. CBG:  Recent Labs Lab 04/16/16 1118 04/16/16 1658 04/16/16 2251 04/17/16 0755 04/17/16 1125  GLUCAP 102* 134* 128* 135* 111*    ) Recent Results (from the past 240 hour(s))  Fungus Culture With Stain (Not @ [facility])     Status: None (Preliminary result)   Collection Time: 04/09/16 12:59 PM  Result Value Ref Range Status   Fungus Stain Final report  Final    Comment: (NOTE) Performed At: [facility] [place], [place] [id number] Lindon Romp MD [id number]    Fungus (Mycology) Culture PENDING  Incomplete   Fungal Source PLEURAL  Final  Culture, body fluid-bottle     Status: None   Collection Time: 04/09/16 12:59 PM  Result Value Ref Range Status   Specimen Description FLUID PLEURAL  Final   Special Requests BOTTLES DRAWN AEROBIC AND ANAEROBIC 5CC EACH  Final   Culture NO GROWTH 6 DAYS  Final   Report Status 04/15/2016 FINAL  Final  Fungus Culture Result     Status: None   Collection Time: 04/09/16 12:59 PM  Result Value Ref Range Status   Result 1 Comment  Final    Comment: (NOTE) KOH/Calcofluor preparation:  no fungus observed. Performed At: [facility] [place], [place] [id number] Lindon Romp MD [id number]   MRSA PCR Screening     Status: None   Collection Time: 04/09/16  1:44 PM  Result Value Ref Range Status   MRSA by PCR NEGATIVE NEGATIVE Final    Comment:        The GeneXpert MRSA Assay (FDA approved for NASAL specimens only), is one component of a comprehensive MRSA colonization surveillance program. It is not intended to diagnose MRSA infection nor to guide or monitor treatment for MRSA infections.       Scheduled Meds: . amLODipine  10 mg Oral QPM  . benazepril  40 mg Oral Daily  . docusate sodium  100 mg Oral BID  . enoxaparin (LOVENOX) injection  1 mg/kg Subcutaneous Q12H  . febuxostat  80 mg Oral Daily  . finasteride  5 mg Oral Daily  . furosemide  40 mg Oral Daily  . hydrALAZINE  50 mg Oral TID  . insulin aspart  0-5 Units Subcutaneous QHS  . insulin aspart  0-9 Units Subcutaneous TID WC  . lipase/protease/amylase  12,000 Units Oral TID WC  . loratadine  10 mg Oral q1800  . metoprolol succinate  100 mg Oral Daily  . niacin  1,000 mg Oral QHS  . pantoprazole  40 mg Oral Daily  . potassium chloride  20 mEq Oral Daily  . pravastatin  40 mg Oral Daily  . sodium chloride flush  3 mL Intravenous Q12H  . warfarin  10 mg Oral Once  . Warfarin -  Pharmacist Dosing Inpatient   Does not apply Q24H   Continuous Infusions: . 0.9 % NaCl with KCl 20 mEq / L 10 mL/hr at 04/12/16 1552     LOS: 8 days    Time spent: 25 minutes   Kathie Dike, MD [facility] Pager [phone]  If 7PM-7AM, please contact night-coverage [email or web address] Password [facility] 04/17/2016, 12:39 PM

## 2016-04-17 NOTE — Progress Notes (Signed)
[facility] for Coumadin Indication: h/o DVT  Allergies  Allergen Reactions  . Allopurinol Other (See Comments)    Significantly decreased WBC's  . Colchicine Other (See Comments)    Significantly decreased WBC's   Patient Measurements: Height: 5\' 5"  (165.1 cm) Weight: 145 lb (65.772 kg) IBW/kg (Calculated) : 61.5  Vital Signs: Temp: 98.1 F (36.7 C) (05/08 0618) Temp Source: Oral (05/08 0618) BP: 152/60 mmHg (05/08 0950) Pulse Rate: 84 (05/08 0950)  Labs:  Recent Labs  04/15/16 0659 04/16/16 0556 04/17/16 0445  HGB 11.1* 10.3*  --   HCT 33.1* 30.8*  --   PLT 356 379  --   LABPROT 15.9* 17.6* 17.1*  INR 1.26 1.44 1.38   Estimated Creatinine Clearance: 53.5 mL/min (by C-G formula based on Cr of 1.07).  Medical History: Past Medical History  Diagnosis Date  . Hypertension   . Heart murmur   . ED (erectile dysfunction)   . Agranulocytosis ([place])   . Diabetes mellitus   . Hyperlipidemia   . Gout   . Adenomatous colon polyp   . Prostate cancer ([place])     Followed by Dr. Jeffie Pollock   . Pancreatitis   . Gallstones   . Allergy   . Internal hemorrhoids   . Diverticulosis   . 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   Medications:  Scheduled:  . amLODipine  10 mg Oral QPM  . benazepril  40 mg Oral Daily  . docusate sodium  100 mg Oral BID  . enoxaparin (LOVENOX) injection  1 mg/kg Subcutaneous Q12H  . febuxostat  80 mg Oral Daily  . finasteride  5 mg Oral Daily  . furosemide  40 mg Oral Daily  . hydrALAZINE  50 mg Oral TID  . insulin aspart  0-5 Units Subcutaneous QHS  . insulin aspart  0-9 Units Subcutaneous TID WC  . lipase/protease/amylase  12,000 Units Oral TID WC  . loratadine  10 mg Oral q1800  . metoprolol succinate  100 mg Oral Daily  . niacin  1,000 mg Oral QHS  . pantoprazole  40 mg Oral Daily  . potassium chloride  20 mEq Oral Daily  .  pravastatin  40 mg Oral Daily  . sodium chloride flush  3 mL Intravenous Q12H  . warfarin  10 mg Oral Once  . 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 Coumadin on hold for thoracentesis (done).  Pt is currently on full dose Lovenox until INR is therapeutic. Procedures completed, Coumadin to be restarted INR sub therapeutic this AM, slight rise this AM  Goal of Therapy:  INR 2-3 Monitor platelets by anticoagulation protocol: Yes   Plan:  Coumadin 10 mg po x 1 dose INR/PT daily Monitor CBC, platelets, s/sx of bleeding complications   Hart Robinsons A 04/17/2016,11:54 AM

## 2016-04-18 ENCOUNTER — Encounter (HOSPITAL_COMMUNITY): Payer: Self-pay | Admitting: Pulmonary Disease

## 2016-04-18 LAB — CBC
HEMATOCRIT: 33.2 % — AB (ref 39.0–52.0)
HEMOGLOBIN: 11.1 g/dL — AB (ref 13.0–17.0)
MCH: 29.1 pg (ref 26.0–34.0)
MCHC: 33.4 g/dL (ref 30.0–36.0)
MCV: 86.9 fL (ref 78.0–100.0)
Platelets: 509 10*3/uL — ABNORMAL HIGH (ref 150–400)
RBC: 3.82 MIL/uL — ABNORMAL LOW (ref 4.22–5.81)
RDW: 16.5 % — ABNORMAL HIGH (ref 11.5–15.5)
WBC: 18.5 10*3/uL — ABNORMAL HIGH (ref 4.0–10.5)

## 2016-04-18 LAB — GLUCOSE, CAPILLARY
GLUCOSE-CAPILLARY: 132 mg/dL — AB (ref 65–99)
Glucose-Capillary: 120 mg/dL — ABNORMAL HIGH (ref 65–99)
Glucose-Capillary: 130 mg/dL — ABNORMAL HIGH (ref 65–99)
Glucose-Capillary: 145 mg/dL — ABNORMAL HIGH (ref 65–99)

## 2016-04-18 LAB — PROTIME-INR
INR: 1.99 — AB (ref 0.00–1.49)
PROTHROMBIN TIME: 22.5 s — AB (ref 11.6–15.2)

## 2016-04-18 MED ORDER — SODIUM CHLORIDE 0.9 % IV SOLN
INTRAVENOUS | Status: DC
  Administered 2016-04-18: 15:00:00 via INTRAVENOUS

## 2016-04-18 MED ORDER — METOPROLOL TARTRATE 5 MG/5ML IV SOLN
5.0000 mg | Freq: Once | INTRAVENOUS | Status: AC
  Administered 2016-04-18: 5 mg via INTRAVENOUS

## 2016-04-18 MED ORDER — WARFARIN SODIUM 5 MG PO TABS
5.0000 mg | ORAL_TABLET | Freq: Once | ORAL | Status: AC
  Administered 2016-04-18: 5 mg via ORAL
  Filled 2016-04-18: qty 1

## 2016-04-18 MED ORDER — METOPROLOL TARTRATE 5 MG/5ML IV SOLN
INTRAVENOUS | Status: AC
  Filled 2016-04-18: qty 5

## 2016-04-18 NOTE — Progress Notes (Signed)
PROGRESS NOTE    Alan [name].  [place]:[id number] DOB: 07-01-1941 DOA: 04/09/2016 PCP: Fransisca Kaufmann Dettinger, MD Outpatient Specialists:    Brief Narrative:  35 yom with a hx of acute necrotizing biliary pancreatitis in 2016 s/p cholecystectomy, prostate CA, HTN, and DM type 2, presented to the ED with complaints of SOB. CXR showed extensive abnormal opacity in the left thorax consistent with a large effusion and underlying consolidation. Contrasted CT showed large left pleural effusion with compressive atelectasis of the left lung, a small right pleural effusion.Thoracentesis has been performed and fluid cytology has been negative. Follow up CT scan shows persistent abnormality in the left bronchus. He underwent bronchoscopy on 5/5 and biopsy which was found to be benign. He has been restarted on anticoagulation for splenic vein thrombosis and can be discharged home once INR is therapeutic. He also has a leukocytosis of unclear etiology. If this does not improve, he will need further workup.  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 Large left pleural effusion with associated left lung compressive atelectasis and irregular left lower lobe bronchus. Patient has had 2 thoracentesis during his hospital stay. Initially done in the ER, thoracentesis removed 850 mL of fluid. Second thoracentesis was done on 5/3 with removal of 1.6 L of fluid. Cytology of fluid negative. Follow-up CT scan of the chest indicated persistent left bronchus abnormalities. Dr. Luan Pulling performed a bronchoscopy and biopsy 5/05. Bronchoscopy and biopsy wee found to be benign. Clinically his SOB has improved. 2. Peripheral edema, possibly  secondary to chronic diastolic heart failure. BNP wnl. Initially patient had 2+ lower extremity edema.  ECHO showed EF 60-65% with grade 1 diastolic dysfunction. He was initially diuresed with IV lasix, and has since been transitioned to PO Lasix. 3. Abnormal pancreas. 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 S/p cholecystectomy. Lipase modestly elevated but has improved. Amylase was also noted in pleural fluid. GI was consulted, but has since signed off. CT of chest done on admission indicated peripancreatic fluid density and poor enhancement of the pancreatic head worrisome for necrosis. Clinically, the patient was not complaining of any abdominal pain or vomiting. GI recommendations are to repeat CT scan in 4 weeks. 4. Embolism/ thrombosis of splenic vein. This was diagnosed on previous admission in 10/2015. He has been anticoagulated for the past 6 months. Since patient has completed 6 months of anticoagulation therapy, recent images were reviewed with radiologist to determine if splenic vein had recannulized. After reviewing recent abdominal ultrasound, it was felt that his splenic vein was not patent. Case was discussed with hematology, Dr. Whitney Muse and it was recommended to continue the patient on anticoagulation for now, and follow up in hematology clinic for further management. He is on coumadin with lovenox bridge. He will need to stay in the hospital until INR is therapeutic. Currently INR is 1.99. He was not put on NOACs since they are cost prohibitive.  5. Leukocytosis. Etiology unclear. He is afebrile and does not have any specific complaints. He may have an element of hemoconcentration. Will give fluid and recheck blood work in the am. If leukocytosis is worse, can consider further workup. 6.  Hypertension. Patient is on multiple antihypertensives. Blood pressure is currently stable.  7. Diabetes mellitus type 2. Continue to follow with sliding scale insulin.  Restart metformin on discharge. 8. Constipation. Resolved after receiving enema  DVT prophylaxis: coumadin>>lovenox Code Status: Full code Family Communication: Discussed with patient and wife Disposition Plan: Discharge to home, possibly tomorrow pending blood work results.   Consultants:   Pulmonology, Dr. Luan Pulling  Gastroenterology  Procedures:  Thoracentesis on 4/30 with removal of 850 mL of fluid Thoracentesis on 5/3 with removal of 1.6 L of fluid -2-D echocardiogram:Study Conclusions - Left ventricle: The cavity size was normal. Wall thickness was  increased in a pattern of moderate to severe LVH. Systolic  function was normal. The estimated ejection fraction was in the  range of 60% to 65%. Wall motion was normal; there were no  regional wall motion abnormalities. Doppler parameters are  consistent with abnormal left ventricular relaxation (grade 1  diastolic dysfunction). - Aortic valve: Mildly calcified annulus. Trileaflet. There was  mild regurgitation. - Mitral valve: Calcified annulus. There was trivial regurgitation. - Left atrium: The atrium was mildly dilated. - Right atrium: Central venous pressure (est): 3 mm Hg. - Tricuspid valve: There was mild regurgitation. - Pulmonic valve: There was mild regurgitation. - Pulmonary arteries: PA peak pressure: 33 mm Hg (S). - Pericardium, extracardiac: A trivial pericardial effusion was  identified posterior to the heart. Bilateral pleural effusions   noted, largest on the left.  Subjective: Feels improved today. His stomach no longer hurts and he has had a BM. He has an appetite. Denies any dysuria. Denies any diarrhea. He has been having some SOB with some slightly productive cough.  Objective: Filed Vitals:   04/17/16 0950 04/17/16 1439 04/17/16 2001 04/18/16 0500  BP: 152/60 137/61 117/94 134/72  Pulse: 84 86 79 82  Temp:  97.9 F (36.6 C) 97.9 F (36.6 C) 98.1 F (36.7 C)  TempSrc:  Oral Oral Oral    Resp:  20 20 20   Height:      Weight:    65.409 kg (144 lb 3.2 oz)  SpO2:  97% 98% 97%    Intake/Output Summary (Last 24 hours) at 04/18/16 1259 Last data filed at 04/18/16 0800  Gross per 24 hour  Intake    694 ml  Output    800 ml  Net   -106 ml   Filed Weights   04/16/16 0544 04/17/16 0618 04/18/16 0500  Weight: 66.497 kg (146 lb 9.6 oz) 65.772 kg (145 lb) 65.409 kg (144 lb 3.2 oz)    Examination:  General exam: Appears calm and comfortable  Respiratory system: Clear to auscultation. Respiratory effort normal. Cardiovascular system: S1 & S2 heard, RRR. No JVD, murmurs, rubs, gallops or clicks. No pedal edema. Gastrointestinal system: Abdomen is nondistended, soft and nontender. No organomegaly or masses felt. Normal bowel sounds heard. Central nervous system: Alert and oriented. No focal neurological deficits. Extremities: Symmetric 5 x 5 power. Skin: No rashes, lesions or ulcers Psychiatry: Judgement and insight appear normal. Mood & affect appropriate.   Data Reviewed: I have personally reviewed following labs and imaging studies  CBC:  Recent Labs Lab 04/13/16 0454 04/14/16 0443 04/15/16 0659 04/16/16 0556 04/18/16 0438  WBC 8.3 8.1 9.7 7.3 18.5*  HGB 11.3* 11.0* 11.1* 10.3* 11.1*  HCT 33.3* 33.2* 33.1* 30.8* 33.2*  MCV 86.9 88.1 86.4 87.7 86.9  PLT 352 400 356 379 [id number]*   Basic Metabolic Panel:  Recent Labs Lab 04/13/16 0454 04/17/16 1609  NA 137 130*  K 3.7 4.6  CL 103 98*  CO2 26 23  GLUCOSE 104* 166*  BUN 18 21*  CREATININE 1.07 1.23  CALCIUM 8.7* 8.7*   Liver Function Tests:  Recent Labs Lab 04/17/16 1609  AST 48*  ALT 38  ALKPHOS 101  BILITOT 1.2  PROT 6.8  ALBUMIN 2.8*    Recent Labs Lab 04/17/16 1609  LIPASE 102*   Coagulation Profile:  Recent Labs Lab 04/14/16 0443 04/15/16 0659 04/16/16 0556 04/17/16 0445 04/18/16 0438  INR 1.25 1.26 1.44 1.38 1.99*   Cardiac Enzymes: No results for input(s): CKTOTAL, CKMB,  CKMBINDEX, TROPONINI in the last 168 hours. CBG:  Recent Labs Lab 04/17/16 1125 04/17/16 1643 04/17/16 2108 04/18/16 0836 04/18/16 1113  GLUCAP 111* 148* 171* 120* 132*    ) Recent Results (from the past 240 hour(s))  Fungus Culture With Stain (Not @ [facility])     Status: None (Preliminary result)   Collection Time: 04/09/16 12:59 PM  Result Value Ref Range Status   Fungus Stain Final report  Final    Comment: (NOTE) Performed At: [facility] [address] [place], [place] [id number] Lindon Romp MD [id number]    Fungus (Mycology) Culture PENDING  Incomplete   Fungal Source PLEURAL  Final  Culture, body fluid-bottle     Status: None   Collection Time: 04/09/16 12:59 PM  Result Value Ref Range Status   Specimen Description FLUID PLEURAL  Final   Special Requests BOTTLES DRAWN AEROBIC AND ANAEROBIC 5CC EACH  Final   Culture NO GROWTH 6 DAYS  Final   Report Status 04/15/2016 FINAL  Final  Fungus Culture Result     Status: None   Collection Time: 04/09/16 12:59 PM  Result Value Ref Range Status   Result 1 Comment  Final    Comment: (NOTE) KOH/Calcofluor preparation:  no fungus observed. Performed At: [facility] [place], [place] [id number] Lindon Romp MD [id number]   MRSA PCR Screening     Status: None   Collection Time: 04/09/16  1:44 PM  Result Value Ref Range Status   MRSA by PCR NEGATIVE NEGATIVE Final    Comment:        The GeneXpert MRSA Assay (FDA approved for NASAL specimens only), is one component of a comprehensive MRSA colonization surveillance program. It is not intended to diagnose MRSA infection nor to guide or monitor treatment for MRSA infections.     Scheduled Meds: . amLODipine  10 mg Oral QPM  . benazepril  40 mg Oral Daily  . docusate sodium  100 mg Oral BID  . enoxaparin (LOVENOX) injection  1 mg/kg Subcutaneous Q12H  . febuxostat  80 mg Oral Daily  . finasteride  5 mg Oral Daily  .  furosemide  40 mg Oral Daily  . hydrALAZINE  50 mg Oral TID  . insulin aspart  0-5 Units Subcutaneous QHS  . insulin aspart  0-9 Units Subcutaneous TID WC  . lipase/protease/amylase  12,000 Units Oral TID WC  . loratadine  10 mg Oral q1800  . metoprolol succinate  100 mg Oral Daily  . niacin  1,000 mg Oral QHS  . pantoprazole  40 mg Oral Daily  . potassium chloride  20 mEq Oral Daily  . pravastatin  40 mg Oral Daily  . sodium chloride flush  3 mL Intravenous Q12H  . warfarin  5 mg Oral Once  . Warfarin - Pharmacist Dosing Inpatient   Does not  apply Q24H   Continuous Infusions: . sodium chloride    . 0.9 % NaCl with KCl 20 mEq / L 10 mL/hr at 04/12/16 1552    LOS: 9 days   Time spent: 25 minutes  Kathie Dike, MD [facility] Pager [phone]  If 7PM-7AM, please contact night-coverage [email or web address] Password [email or web address] 04/18/2016, 12:59 PM    By signing my name below, I, Delene Ruffini, attest that this documentation has been prepared under the direction and in the presence of Kathie Dike, MD. Electronically Signed: Delene Ruffini 04/18/2016 12:30pm  I, Dr. Kathie Dike, personally performed the services described in this documentaiton. All medical record entries made by the scribe were at my direction and in my presence. I have reviewed the chart and agree that the record reflects my personal performance and is accurate and complete  Kathie Dike, MD, 04/18/2016 1:00 PM

## 2016-04-18 NOTE — Progress Notes (Signed)
[place] for Coumadin Indication: h/o DVT  Allergies  Allergen Reactions  . Allopurinol Other (See Comments)    Significantly decreased WBC's  . Colchicine Other (See Comments)    Significantly decreased WBC's   Patient Measurements: Height: 5\' 5"  (165.1 cm) Weight: 144 lb 3.2 oz (65.409 kg) IBW/kg (Calculated) : 61.5  Vital Signs: Temp: 98.1 F (36.7 C) (05/09 0500) Temp Source: Oral (05/09 0500) BP: 134/72 mmHg (05/09 0500) Pulse Rate: 82 (05/09 0500)  Labs:  Recent Labs  04/16/16 0556 04/17/16 0445 04/17/16 1609 04/18/16 0438  HGB 10.3*  --   --  11.1*  HCT 30.8*  --   --  33.2*  PLT 379  --   --  509*  LABPROT 17.6* 17.1*  --  22.5*  INR 1.44 1.38  --  1.99*  CREATININE  --   --  1.23  --    Estimated Creatinine Clearance: 46.5 mL/min (by C-G formula based on Cr of 1.23).  Medical History: Past Medical History  Diagnosis Date  . Hypertension   . Heart murmur   . ED (erectile dysfunction)   . Agranulocytosis ([place])   . Diabetes mellitus   . Hyperlipidemia   . Gout   . Adenomatous colon polyp   . Prostate cancer ([place])     Followed by Dr. Jeffie Pollock   . Pancreatitis   . Gallstones   . Allergy   . Internal hemorrhoids   . Diverticulosis   . 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   Medications:  Scheduled:  . amLODipine  10 mg Oral QPM  . benazepril  40 mg Oral Daily  . docusate sodium  100 mg Oral BID  . enoxaparin (LOVENOX) injection  1 mg/kg Subcutaneous Q12H  . febuxostat  80 mg Oral Daily  . finasteride  5 mg Oral Daily  . furosemide  40 mg Oral Daily  . hydrALAZINE  50 mg Oral TID  . insulin aspart  0-5 Units Subcutaneous QHS  . insulin aspart  0-9 Units Subcutaneous TID WC  . lipase/protease/amylase  12,000 Units Oral TID WC  . loratadine  10 mg Oral q1800  . metoprolol succinate  100 mg Oral Daily  . niacin  1,000 mg Oral QHS   . pantoprazole  40 mg Oral Daily  . potassium chloride  20 mEq Oral Daily  . pravastatin  40 mg Oral Daily  . sodium chloride flush  3 mL Intravenous Q12H  . 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 Pt is currently on full dose Lovenox until INR is therapeutic. INR 1.99 today with fairly large increase overnight.  Labs stable  Goal of Therapy:  INR 2-3 Monitor platelets by anticoagulation protocol: Yes   Plan:  Coumadin 5 mg po today INR/PT daily Monitor CBC, platelets, s/sx of bleeding complications   Ritta Hammes [place] 04/18/2016,8:55 AM

## 2016-04-19 ENCOUNTER — Encounter (HOSPITAL_COMMUNITY): Payer: Self-pay | Admitting: Family Medicine

## 2016-04-19 DIAGNOSIS — I480 Paroxysmal atrial fibrillation: Secondary | ICD-10-CM

## 2016-04-19 LAB — CBC
HEMATOCRIT: 32.1 % — AB (ref 39.0–52.0)
HEMOGLOBIN: 10.9 g/dL — AB (ref 13.0–17.0)
MCH: 29.6 pg (ref 26.0–34.0)
MCHC: 34 g/dL (ref 30.0–36.0)
MCV: 87.2 fL (ref 78.0–100.0)
Platelets: 502 10*3/uL — ABNORMAL HIGH (ref 150–400)
RBC: 3.68 MIL/uL — AB (ref 4.22–5.81)
RDW: 16.4 % — ABNORMAL HIGH (ref 11.5–15.5)
WBC: 12.4 10*3/uL — ABNORMAL HIGH (ref 4.0–10.5)

## 2016-04-19 LAB — GLUCOSE, CAPILLARY
GLUCOSE-CAPILLARY: 124 mg/dL — AB (ref 65–99)
GLUCOSE-CAPILLARY: 147 mg/dL — AB (ref 65–99)
GLUCOSE-CAPILLARY: 135 mg/dL — AB (ref 65–99)
Glucose-Capillary: 113 mg/dL — ABNORMAL HIGH (ref 65–99)

## 2016-04-19 LAB — OTHER BODY FLUID CHEMISTRY

## 2016-04-19 LAB — PROTIME-INR
INR: 2.29 — ABNORMAL HIGH (ref 0.00–1.49)
Prothrombin Time: 25 seconds — ABNORMAL HIGH (ref 11.6–15.2)

## 2016-04-19 LAB — BASIC METABOLIC PANEL
Anion gap: 8 (ref 5–15)
BUN: 28 mg/dL — AB (ref 6–20)
CHLORIDE: 103 mmol/L (ref 101–111)
CO2: 23 mmol/L (ref 22–32)
CREATININE: 1.45 mg/dL — AB (ref 0.61–1.24)
Calcium: 8.3 mg/dL — ABNORMAL LOW (ref 8.9–10.3)
GFR calc Af Amer: 54 mL/min — ABNORMAL LOW (ref >60)
GFR calc non Af Amer: 46 mL/min — ABNORMAL LOW (ref >60)
GLUCOSE: 129 mg/dL — AB (ref 65–99)
POTASSIUM: 4.1 mmol/L (ref 3.5–5.1)
SODIUM: 134 mmol/L — AB (ref 135–145)

## 2016-04-19 MED ORDER — METOPROLOL TARTRATE 5 MG/5ML IV SOLN
5.0000 mg | Freq: Once | INTRAVENOUS | Status: AC
  Administered 2016-04-19: 5 mg via INTRAVENOUS
  Filled 2016-04-19: qty 5

## 2016-04-19 MED ORDER — SODIUM CHLORIDE 0.9 % IV SOLN: INTRAVENOUS | Status: AC

## 2016-04-19 MED ORDER — WARFARIN SODIUM 5 MG PO TABS
6.0000 mg | ORAL_TABLET | ORAL | Status: DC
  Administered 2016-04-19: 6 mg via ORAL
  Filled 2016-04-19: qty 1

## 2016-04-19 NOTE — Progress Notes (Signed)
PROGRESS NOTE  Alan [name]. [place]:[id number] DOB: 01/07/1941 DOA: 04/09/2016 PCP: Fransisca Kaufmann Dettinger, MD  Brief Narrative: 75 year old man with history of necrotizing biliary pancreatitis November 2016, thrombosis splenic artery, diabetes mellitus, presented with shortness of breath for several weeks. Chest x-ray revealed extensive effusion with underlying consolidation in the left. Chest CT confirmed a large left pleural effusion with compressive atelectasis, irregular left lower lobe bronchus. The patient underwent thoracentesis in the emergency department with removal of 800 mL he was seen by pulmonology with recommendations to repeat thoracentesis and chest CT. After second thoracentesis, repeat chest CT revealed persistent abnormal appearance of the bronchus and bronchoscopy was performed without complication. Patient's warfarin was held for this procedure and resumed post procedure and plans were made for discharge once the patient's INR was therapeutic. Pancreatic abnormalities were noted and gastroenterology saw the patient with recommendations as below. Patient developed atrial fibrillation which converted with IV beta blocker but recurred. Currently in sinus rhythm. Anticipate discharge next 24 hours.  Assessment/Plan: 1. Large left pleural effusion with associated left lung compressive atelectasis and irregular left lower lobe bronchus. Status post thoracentesis 2 area status post bronchoscopy with biopsy 5/5 with benign results. Possibly related to previous pancreatitis. No evidence of infection or malignancy. 2. 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 Follow-up ultrasound suggested the vessel was not patent. Dr. Whitney Muse recommended continuing anticoagulation and follow-up in the outpatient setting. Treated with Lovenox until INR therapeutic. Continued on warfarin. 3. Atrial fibrillation/flutter with rapid ventricular response. Seen 5/8 and again 5/10. Currently in sinus  rhythm. CHA2DS2-VASc = 6. Already on warfarin. Of note patient was anticoagulated prior to onset. TSH was within normal limits. Echocardiogram as below. 4. Normocytic anemia. Stable. 5. Grade 1 diastolic dysfunction. Appears stable. 6. Abnormal pancreas. 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  CT of chest done on admission indicated peripancreatic fluid density and poor enhancement of the pancreatic head concerning for necrosis. GI recommends repeating CT scan in 4 weeks. 7. Dehydration. BUN/creatinine trending upwards consistent with poor oral intake and Lasix use.  8. DM type 2. Continue SSI. Restart metformin on discharge. 9. Constipation. Resolved.   Overall continues to improve. Wean O2 a tolerated   Discontinue Lovenox. Continue warfarin.  Will give fluids. Recheck BMP in a.m. Gently hydrate. Follow heart rate. Currently stable on Toprol-XL.   Repeat CT of the abdomen and pelvis (pancreatic protocol CT) in 4-6 weeks, will be arranged by GI.  DVT prophylaxis: lovenox Code Status: Full code Family Communication: Discussed with patient and wife Disposition Plan: Discharge to home, likely within 24 hrs.  Murray Hodgkins, MD  [facility] Direct contact:  --Via amion app OR  --[email or web address]; password [email or web address] and click  [phone] contact night coverage as above 04/19/2016, 5:40 PM  LOS: 10 days   Consultants:   Pulmonology, Dr. Luan Pulling  Gastroenterology  Procedures:   Thoracentesis on 4/30 with removal of 850 mL of fluid  Thoracentesis on 5/3 with removal of 1.6 L of fluid  Bronchoscopy 5/5  2-D echocardiogram:Study Conclusions - Left ventricle: The cavity size was normal. Wall thickness was  increased in a pattern of moderate to severe LVH. Systolic  function was normal. The estimated ejection fraction was in the  range of 60% to 65%. Wall motion was normal; there were no  regional wall motion abnormalities. Doppler parameters  are  consistent with abnormal left ventricular relaxation (grade 1  diastolic dysfunction). - Aortic valve: Mildly calcified  annulus. Trileaflet. There was  mild regurgitation. - Mitral valve: Calcified annulus. There was trivial regurgitation. - Left atrium: The atrium was mildly dilated. - Right atrium: Central venous pressure (est): 3 mm Hg. - Tricuspid valve: There was mild regurgitation. - Pulmonic valve: There was mild regurgitation. - Pulmonary arteries: PA peak pressure: 33 mm Hg (S). - Pericardium, extracardiac: A trivial pericardial effusion was  identified posterior to the heart. Bilateral pleural effusions noted, largest on the left.  Antimicrobials:  none  HPI/Subjective: Feels tired and has decreased appetite. He has been getting SOB. Denies nausea or vomiting, but reports congestion. Reports stomach pain onset 3 days ago.   RN reports patient had an episode of atrial fibrillation last evening that resolved with 1 dose of IV metoprolol. Again this morning he developed atrial fibrillation with rapid ventricular response with heart rate up to the 140s. This subsequently resolved with another dose of IV metoprolol and administration of regular Toprol.  Objective: Filed Vitals:   04/19/16 0500 04/19/16 0847 04/19/16 1230 04/19/16 1315  BP: 109/56 93/63 109/83 115/58  Pulse: 84 143 72 69  Temp: 97.7 F (36.5 C)   98.3 F (36.8 C)  TempSrc: Oral   Oral  Resp: 20 30  20   Height:      Weight: 67.45 kg (148 lb 11.2 oz)     SpO2: 99% 95%  96%    Intake/Output Summary (Last 24 hours) at 04/19/16 1740 Last data filed at 04/18/16 2225  Gross per 24 hour  Intake    240 ml  Output    200 ml  Net     40 ml     Filed Weights   04/17/16 0618 04/18/16 0500 04/19/16 0500  Weight: 65.772 kg (145 lb) 65.409 kg (144 lb 3.2 oz) 67.45 kg (148 lb 11.2 oz)    Exam:    Constitutional:  . Appears calm and comfortable Respiratory:  . CTA bilaterally, no w/r/r.   . Respiratory effort normal. No retractions or accessory muscle use Cardiovascular:  . RRR, no m/r/g . No Lower extremity edema   . Telemetry showed afib with RVR  Abdomen:  . Abdomen appears normal; no tenderness or masses Psychiatric:  . judgement and insight appear normal . Mental status o Mood, affect appropriate  I have personally reviewed following labs and imaging studies:  BUN 28, Cr 1.45  Blood sugars stable  WBC 12.4. Improved  CBC stable  INR therapeutic at 2.29.  Scheduled Meds: . docusate sodium  100 mg Oral BID  . febuxostat  80 mg Oral Daily  . finasteride  5 mg Oral Daily  . furosemide  40 mg Oral Daily  . hydrALAZINE  50 mg Oral TID  . insulin aspart  0-5 Units Subcutaneous QHS  . insulin aspart  0-9 Units Subcutaneous TID WC  . lipase/protease/amylase  12,000 Units Oral TID WC  . loratadine  10 mg Oral q1800  . metoprolol succinate  100 mg Oral Daily  . niacin  1,000 mg Oral QHS  . pantoprazole  40 mg Oral Daily  . potassium chloride  20 mEq Oral Daily  . pravastatin  40 mg Oral Daily  . sodium chloride flush  3 mL Intravenous Q12H  . warfarin  6 mg Oral Q24H  . Warfarin - Pharmacist Dosing Inpatient   Does not apply Q24H   Continuous Infusions: . sodium chloride 50 mL/hr at 04/19/16 1230    Principal Problem:   Pleural effusion, left Active Problems:  BPH (benign prostatic hyperplasia)   Embolism and thrombosis of splenic artery   Abnormal CT scan, pancreas or bile duct   Elevated lipase   Essential hypertension   Type 2 diabetes mellitus without complications (HCC)   Pleural effusion on left   Atrial fibrillation (HCC)   LOS: 10 days   Time spent 25 minutes  By signing my name below, I, Delene Ruffini, attest that this documentation has been prepared under the direction and in the presence of Daniel P. Sarajane Jews, MD. Electronically Signed: Delene Ruffini, Scribe.  04/19/2016 12:15pm  I personally performed the services  described in this documentation. All medical record entries made by the scribe were at my direction. I have reviewed the chart and agree that the record reflects my personal performance and is accurate and complete. Murray Hodgkins, MD

## 2016-04-19 NOTE — Progress Notes (Signed)
SATURATION QUALIFICATIONS: (This note is used to comply with regulatory documentation for home oxygen)  Patient Saturations on Room Air at Rest = 96%  Patient Saturations on Room Air while Ambulating = 96-97%  Patient Saturations on  Liters of oxygen while Ambulating = N/A  Please briefly explain why patient needs home oxygen:

## 2016-04-19 NOTE — Progress Notes (Signed)
[facility] for Coumadin Indication: h/o DVT  Allergies  Allergen Reactions  . Allopurinol Other (See Comments)    Significantly decreased WBC's  . Colchicine Other (See Comments)    Significantly decreased WBC's   Patient Measurements: Height: 5\' 5"  (165.1 cm) Weight: 148 lb 11.2 oz (67.45 kg) IBW/kg (Calculated) : 61.5  Vital Signs: Temp: 97.7 F (36.5 C) (05/10 0500) Temp Source: Oral (05/10 0500) BP: 93/63 mmHg (05/10 0847) Pulse Rate: 143 (05/10 0847)  Labs:  Recent Labs  04/17/16 0445 04/17/16 1609 04/18/16 0438 04/19/16 0520  HGB  --   --  11.1* 10.9*  HCT  --   --  33.2* 32.1*  PLT  --   --  509* 502*  LABPROT 17.1*  --  22.5* 25.0*  INR 1.38  --  1.99* 2.29*  CREATININE  --  1.23  --  1.45*   Estimated Creatinine Clearance: 39.5 mL/min (by C-G formula based on Cr of 1.45).  Medical History: Past Medical History  Diagnosis Date  . Hypertension   . Heart murmur   . ED (erectile dysfunction)   . Agranulocytosis ([place])   . Diabetes mellitus   . Hyperlipidemia   . Gout   . Adenomatous colon polyp   . Prostate cancer ([place])     Followed by Dr. Jeffie Pollock   . Pancreatitis   . Gallstones   . Allergy   . Internal hemorrhoids   . Diverticulosis   . 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   Medications:  Scheduled:  . docusate sodium  100 mg Oral BID  . enoxaparin (LOVENOX) injection  1 mg/kg Subcutaneous Q12H  . febuxostat  80 mg Oral Daily  . finasteride  5 mg Oral Daily  . furosemide  40 mg Oral Daily  . hydrALAZINE  50 mg Oral TID  . insulin aspart  0-5 Units Subcutaneous QHS  . insulin aspart  0-9 Units Subcutaneous TID WC  . lipase/protease/amylase  12,000 Units Oral TID WC  . loratadine  10 mg Oral q1800  . metoprolol succinate  100 mg Oral Daily  . niacin  1,000 mg Oral QHS  . pantoprazole  40 mg Oral Daily  . potassium chloride  20  mEq Oral Daily  . pravastatin  40 mg Oral Daily  . sodium chloride flush  3 mL Intravenous Q12H  . 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 INR is therapeutic today.  Lovenox has been d/c'd.  CBC appears stable. Home dose reportedly 6mg  daily.    Anticoagulation Dose Instructions as of 03/16/2016       Total Sun Mon Tue Wed Thu Fri Sat    New Dose 42 mg 6 mg 6 mg 6 mg 6 mg 6 mg 6 mg 6 mg      (1 mg x 1)  (1 mg x 1)  (1 mg x 1)  (1 mg x 1)  (1 mg x 1)  (1 mg x 1)  (1 mg x 1)       (5 mg x 1)  (5 mg x 1)  (5 mg x 1)  (5 mg x 1)  (5 mg x 1)  (5 mg x 1)  (5 mg x 1)  Goal of Therapy:  INR 2-3 Monitor platelets by anticoagulation protocol: Yes   Plan:  Coumadin 6 mg po daily at 1600hrs INR/PT daily until INR stable Monitor CBC, platelets, s/sx of bleeding complications  Hart Robinsons, PharmD Clinical Pharmacist Pager:  [phone] 04/19/2016 11:17 AM

## 2016-04-20 LAB — PROTIME-INR
INR: 2.19 — ABNORMAL HIGH (ref 0.00–1.49)
PROTHROMBIN TIME: 24.2 s — AB (ref 11.6–15.2)

## 2016-04-20 LAB — GLUCOSE, CAPILLARY
GLUCOSE-CAPILLARY: 148 mg/dL — AB (ref 65–99)
Glucose-Capillary: 116 mg/dL — ABNORMAL HIGH (ref 65–99)

## 2016-04-20 NOTE — Care Management Important Message (Signed)
Important Message  Patient Details  Name: Alan [name]. MRN: [id number] Date of Birth: 1941-07-16   Medicare Important Message Given:  Yes    Alvie Heidelberg, RN 04/20/2016, 11:59 AM

## 2016-04-20 NOTE — Progress Notes (Signed)
[facility] [place] for Coumadin Indication: h/o DVT  Allergies  Allergen Reactions  . Allopurinol Other (See Comments)    Significantly decreased WBC's  . Colchicine Other (See Comments)    Significantly decreased WBC's   Patient Measurements: Height: 5\' 5"  (165.1 cm) Weight: 154 lb 3.2 oz (69.945 kg) IBW/kg (Calculated) : 61.5  Vital Signs: Temp: 98.5 F (36.9 C) (05/11 0642) Temp Source: Oral (05/11 0642) BP: 122/55 mmHg (05/11 0642) Pulse Rate: 72 (05/11 0642)  Labs:  Recent Labs  04/17/16 1609 04/18/16 0438 04/19/16 0520 04/20/16 0500  HGB  --  11.1* 10.9*  --   HCT  --  33.2* 32.1*  --   PLT  --  509* 502*  --   LABPROT  --  22.5* 25.0* 24.2*  INR  --  1.99* 2.29* 2.19*  CREATININE 1.23  --  1.45*  --    Estimated Creatinine Clearance: 39.5 mL/min (by C-G formula based on Cr of 1.45).  Medical History: Past Medical History  Diagnosis Date  . Hypertension   . Heart murmur   . ED (erectile dysfunction)   . Agranulocytosis ([place])   . Diabetes mellitus   . Hyperlipidemia   . Gout   . Adenomatous colon polyp   . Prostate cancer ([place])     Followed by Dr. Jeffie Pollock   . Pancreatitis   . Gallstones   . Allergy   . Internal hemorrhoids   . Diverticulosis   . 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  . Atrial fibrillation ([place]) 04/19/2016   Medications:  Scheduled:  . docusate sodium  100 mg Oral BID  . febuxostat  80 mg Oral Daily  . finasteride  5 mg Oral Daily  . furosemide  40 mg Oral Daily  . hydrALAZINE  50 mg Oral TID  . insulin aspart  0-5 Units Subcutaneous QHS  . insulin aspart  0-9 Units Subcutaneous TID WC  . lipase/protease/amylase  12,000 Units Oral TID WC  . loratadine  10 mg Oral q1800  . metoprolol succinate  100 mg Oral Daily  . niacin  1,000 mg Oral QHS  . pantoprazole  40 mg Oral Daily  . potassium chloride  20 mEq Oral Daily  .  pravastatin  40 mg Oral Daily  . sodium chloride flush  3 mL Intravenous Q12H  . warfarin  6 mg Oral Q24H  . 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 INR is therapeutic today.  Lovenox has been d/c'd.  CBC appears stable. Home dose reportedly 6mg  daily.    Anticoagulation Dose Instructions as of 03/16/2016       Total Sun Mon Tue Wed Thu Fri Sat    New Dose 42 mg 6 mg 6 mg 6 mg 6 mg 6 mg 6 mg 6 mg      (1 mg x 1)  (1 mg x 1)  (1 mg x 1)  (1 mg x 1)  (1 mg x 1)  (1 mg x 1)  (1 mg x 1)       (5 mg x 1)  (5 mg x 1)  (5 mg x 1)  (5 mg x 1)  (5 mg x 1)  (5 mg x 1)  (5 mg x 1)  Goal of Therapy:  INR 2-3 Monitor platelets by anticoagulation protocol: Yes   Plan:  Coumadin 6 mg po daily at 1600hrs INR/PT daily until INR stable Monitor CBC, platelets, s/sx of bleeding complications  Hart Robinsons, PharmD Clinical Pharmacist Pager:  [phone] 04/20/2016 10:31 AM

## 2016-04-20 NOTE — Discharge Summary (Signed)
Physician Discharge Summary  [name] Alan [name]:[id number] DOB: August 30, 1941 DOA: 04/09/2016  PCP: Fransisca Kaufmann Dettinger, MD  Admit date: 04/09/2016 Discharge date: 04/20/2016  Time spent: 35 minutes  Recommendations for Outpatient Follow-up:  1. Follow-up PCP for PT/INR check, follow-up hospitalization, pleural effusion of unclear etiology. 2. New diagnosis atrial fibrillation, prior paroxysmal, currently in sinus rhythm on discharge, already on warfarin. 3. Abnormal appearance of the pancreas, follow-up with gastroenterology as an outpatient.  Follow-up Information    Follow up with Laban Emperor, NP On 05/15/2016.   Specialty:  Gastroenterology   Why:  at 1:30 pm   Contact information:   [address] [place] [address] [phone]       Follow up with Worthy Rancher, MD On 04/28/2016.   Specialties:  Family Medicine, Cardiology   Why:  follow up PT/INR check at 8:55 am   Contact information:   [place]  [phone]       Discharge Diagnoses:  1. Large left pleural effusion 2. Thrombosis of the splenic vein, present on admission 3. Atrial fibrillation/flutter with rapid ventricular response 4. Normocytic anemia  5. Grade 1 diastolic dysfunction 6. Diabetes mellitus type 2 7. 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 Chest x-ray revealed extensive effusion with underlying consolidation in the left. Chest CT confirmed a large left pleural effusion with compressive atelectasis, irregular left lower lobe bronchus.    Hospital Course:  The patient underwent thoracentesis in the emergency department with removal of 800 mL he was seen by pulmonology with recommendations to repeat thoracentesis and chest CT. After second thoracentesis, repeat chest CT revealed persistent abnormal appearance of the bronchus and bronchoscopy was performed without complication. Findings were felt to be benign in nature. Patient's warfarin was held for this procedure and resumed post procedure and plans were made for discharge once the patient's INR was therapeutic. Pancreatic abnormalities were noted and gastroenterology saw the patient with recommendations as below. Patient developed atrial fibrillation which converted with IV beta blocker but recurred. He subsequently has remained in sinus rhythm and is stable for discharge at this time. Individual issues as below.  Individual issues as below:  1. Large left pleural effusion with associated left lung compressive atelectasis and irregular left lower lobe bronchus. Asymptomatic. Status post thoracentesis 2 area status post bronchoscopy with biopsy 5/5 with benign results. Possibly related to previous pancreatitis. No evidence of infection or malignancy. 2. 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 INR therapeutic. Follow-up ultrasound suggested the vessel was not patent. Dr. Whitney Muse recommended continuing anticoagulation and follow-up in the outpatient setting. Treated with Lovenox until INR therapeutic. Continued on warfarin. 3. Atrial fibrillation/flutter with rapid ventricular response. Seen 5/8 and again 5/10. Currently in sinus rhythm. CHA2DS2-VASc = 6. Already on warfarin. Of note patient was anticoagulated prior to onset. TSH was within normal limits. Echocardiogram as below. 4. Normocytic anemia. 5. Grade 1 diastolic dysfunction. Appears stable. 6. Abnormal pancreas. 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 CT of chest done  on admission indicated  peripancreatic fluid density and poor enhancement of the pancreatic head concerning for necrosis. GI will follow-up as an outpatient. 7. DM type 2. Continue SSI. Restart metformin on discharge. 8. Constipation. Resolved.  Procedures:  Thoracentesis on 4/30 with removal of 850 mL of fluid  Thoracentesis on 5/3 with removal of 1.6 L of fluid  2-D echocardiogram:Study Conclusions - Left ventricle: The cavity size was normal. Wall thickness was  increased in a pattern of moderate to severe LVH. Systolic  function was normal. The estimated ejection fraction was in the  range of 60% to 65%. Wall motion was normal; there were no  regional wall motion abnormalities. Doppler parameters are  consistent with abnormal left ventricular relaxation (grade 1  diastolic dysfunction). - Aortic valve: Mildly calcified annulus. Trileaflet. There was  mild regurgitation. - Mitral valve: Calcified annulus. There was trivial regurgitation. - Left atrium: The atrium was mildly dilated. - Right atrium: Central venous pressure (est): 3 mm Hg. - Tricuspid valve: There was mild regurgitation. - Pulmonic valve: There was mild regurgitation. - Pulmonary arteries: PA peak pressure: 33 mm Hg (S). - Pericardium, extracardiac: A trivial pericardial effusion was  identified posterior to the heart. Bilateral pleural effusions  noted, largest on the left.  Consultations:  Pulmonology, Dr. Luan Pulling  Gastroenterology  Discharge Instructions  Discharge Instructions    Diet - low sodium heart healthy    Complete by:  As directed      Diet Carb Modified    Complete by:  As directed      Discharge instructions    Complete by:  As directed   Call your physician or seek immediate medical attention for pain, shortness of breath, fever or worsening of condition.     Increase activity slowly    Complete by:  As directed           Discharge Medication List as of 04/20/2016 12:14 PM     CONTINUE these medications which have NOT CHANGED   Details  amLODipine (NORVASC) 10 MG tablet Take 1 tablet (10 mg total) by mouth every evening., Starting 09/08/2015, Until Discontinued, Normal    benazepril (LOTENSIN) 40 MG tablet TAKE ONE TABLET BY MOUTH ONCE DAILY, Normal    febuxostat (ULORIC) 40 MG tablet Take 2 tablets (80 mg total) by mouth daily., Starting 09/08/2015, Until Discontinued, Normal    finasteride (PROSCAR) 5 MG tablet Take 5 mg by mouth daily., Starting 10/25/2015, Until Discontinued, Historical Med    fluticasone (FLONASE) 50 MCG/ACT nasal spray Place 1 spray into both nostrils 2 (two) times daily as needed for allergies or rhinitis., Starting 11/05/2015, Until Discontinued, Normal    furosemide (LASIX) 40 MG tablet Take 1 tablet (40 mg total) by mouth daily as needed for fluid., Starting 10/19/2015, Until Discontinued, Print    hydrALAZINE (APRESOLINE) 50 MG tablet Take 1 tablet (50 mg total) by mouth 3 (three) times daily., Starting 01/21/2016, Until Discontinued, Normal    levocetirizine (XYZAL) 5 MG tablet Take 1 tablet (5 mg total) by mouth every evening., Starting 11/05/2015, Until Discontinued, Normal    metFORMIN (GLUCOPHAGE) 500 MG tablet TAKE ONE TABLET BY MOUTH TWICE DAILY WITH A MEAL, Normal    metoprolol succinate (TOPROL-XL) 100 MG 24 hr tablet Take 1 tablet (100 mg total) by mouth 1 day or 1 dose., Starting 09/08/2015, Until Discontinued, Normal    niacin (NIASPAN) 1000 MG CR tablet Take 1 tablet (1,000 mg total) by mouth at bedtime., Starting 09/08/2015, Until Discontinued, Normal    omeprazole (  PRILOSEC) 20 MG capsule Take 1 capsule (20 mg total) by mouth daily., Starting 01/22/2016, Until Discontinued, Normal    pravastatin (PRAVACHOL) 40 MG tablet Take 40 mg by mouth daily., Until Discontinued, Historical Med    !! warfarin (COUMADIN) 1 MG tablet Take 1 tablet (1 mg total) by mouth daily., Starting 01/07/2016, Until Discontinued, Normal    !!  warfarin (COUMADIN) 5 MG tablet TAKE ONE TABLET BY MOUTH ONCE DAILY AT  6  PM, Normal     !! - Potential duplicate medications found. Please discuss with provider.     Allergies  Allergen Reactions  . Allopurinol Other (See Comments)    Significantly decreased WBC's  . Colchicine Other (See Comments)    Significantly decreased WBC's     The results of significant diagnostics from this hospitalization (including imaging, microbiology, ancillary and laboratory) are listed below for reference.    Significant Diagnostic Studies: Dg Chest 2 View  04/09/2016  CLINICAL DATA:  Thoracentesis EXAM: CHEST  2 VIEW COMPARISON:  900 hours today FINDINGS: Left pleural effusion has improved. There is no left pneumothorax. Small right pleural effusion and basilar atelectasis. Normal heart size. IMPRESSION: No pneumothorax post left thoracentesis. Electronically Signed   By: Marybelle Killings M.D.   On: 04/09/2016 13:55   Ct Chest Wo Contrast  04/12/2016  CLINICAL DATA:  LEFT pleural effusion EXAM: CT CHEST WITHOUT CONTRAST TECHNIQUE: Multidetector CT imaging of the chest was performed following the standard protocol without IV contrast. COMPARISON:  04/09/2016 FINDINGS: Scattered atherosclerotic calcifications aorta and coronary arteries. Upper normal caliber ascending thoracic aorta 3.7 cm transverse. Scattered normal sized mediastinal lymph nodes without definite adenopathy. Dilated main pulmonary artery. Questionable gastric wall thickening versus artifact from underdistention. Small amount of free fluid is seen in the upper abdomen perigastric. Fluid and probably small lymph nodes at porta hepatis and gastrohepatic ligament. Enlargement of pancreatic head, unable to exclude pancreatic mass of this could all bliss of the seen with focal pancreatitis. Gallbladder surgically absent. Remaining visualized upper abdomen unremarkable. BILATERAL pleural effusions LEFT larger than RIGHT, LEFT decreased in size since the  previous study post thoracentesis. No discrete pleural based mass identified by noncontrast exam. Significant compressive atelectasis in both lower lobes. Irregular LEFT lower lobe bronchus again identified with an area of nodular wall thickening on coronal imaging, unable to exclude endobronchial lesion. No additional lung abnormalities. Degenerative disc disease changes thoracic spine. IMPRESSION: Persistent bibasilar effusions and atelectasis LEFT greater than RIGHT despite preceding LEFT thoracentesis. Persistent endobronchial irregularity and suspected nodularity in the LEFT lower lobe bronchus, unable to exclude endobronchial lesion; recommend bronchoscopic assessment. Free fluid in upper abdomen with questionable gastric wall thickening versus artifact and enlargement of pancreatic head question mass versus focal pancreatitis ; followup CT imaging of the abdomen with oral contrast and with/without IV contrast utilizing focus pancreatic protocol recommended to further assess these findings. Findings called to Dr. Luan Pulling on 04/12/2016 at 1415 hours. Electronically Signed   By: Lavonia Dana M.D.   On: 04/12/2016 14:19   Ct Chest W Contrast  04/09/2016  CLINICAL DATA:  Short of breath for 3 weeks.  Pancreatitis. EXAM: CT CHEST WITH CONTRAST TECHNIQUE: Multidetector CT imaging of the chest was performed during intravenous contrast administration. CONTRAST:  30mL ISOVUE-300 IOPAMIDOL (ISOVUE-300) INJECTION 61% COMPARISON:  CT abdomen 10/11/2015 FINDINGS: Large left pleural effusion is present. There is compressive atelectasis of much of the left lung. On images 78 through 84, the left lower lobe bronchus is irregular and somewhat  narrowed. Malignancy surrounding the left lower lobe bronchus cannot be excluded. There is no discrete mass identified by CT. There is a small right pleural effusion with associated dependent atelectasis. There is a rounded filling defect in the right bronchus intermedius on image 78  which may simply represent mucous material. Small mediastinal nodes. Minimal left main, LAD territory, and circumflex coronary artery calcifications. No pneumothorax. The upper abdomen remains abnormal. Peripancreatic fluid density has improved. There continues to be fluid density surrounding the porta hepatis. The pancreatic body and tail are atrophic. There continues to be poor enhancement of the pancreatic head worrisome for necrosis. There is noted to be severe narrowing of the portal vein adjacent to the area of suspected pancreatic necrosis. Underlying mass in the pancreatic head cannot be excluded. 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 IMPRESSION: Large left pleural effusion. There is compressive atelectasis of much of the left lung. There is irregularity of the left lower lobe bronchus. Underlying bronchogenic lesion cannot be excluded. Small right pleural effusion and compressive atelectasis of the right lower lobe. Rounded density in the right bronchus intermedius may simply represent mucous material Findings related to acute complicated pancreatitis in the abdomen have improved. The continues to be fluid density about the pancreas and porta hepatis. There continues to be low density in the head of the pancreas compressing the portal vein. This may represent necrosis but underlying mass is not excluded. 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 Electronically Signed   By: Marybelle Killings M.D.   On: 04/09/2016 11:30   US Abdomen Complete  04/10/2016  CLINICAL DATA:  Necrotizing pancreatitis. Elevated serum lipase level. EXAM: ABDOMEN ULTRASOUND COMPLETE COMPARISON:  10/07/2015 FINDINGS: Gallbladder: Surgically absent. Common bile duct: Diameter: 5 mm, within normal limits. Liver: No focal lesion identified. Within normal limits in parenchymal echogenicity. IVC: No abnormality visualized. Pancreas: Although visualization of pancreas is limited by sonography, there are appear to be several small  pancreatic fluid collections in the area the head and neck, measuring up to 2 cm. These are suspicious for pancreatic pseudocysts. Spleen: Size and appearance within normal limits. Right Kidney: Length: 10.3 cm. Echogenicity within normal limits. No mass or hydronephrosis visualized. Left Kidney: Length: 11.4 cm. Echogenicity within normal limits. No mass or hydronephrosis visualized. Abdominal aorta: No aneurysm visualized. Other findings: A small amount of free fluid is seen in the epigastric region adjacent left hepatic lobe. And small bilateral pleural effusions are also noted. IMPRESSION: Previous cholecystectomy.  No evidence of biliary ductal dilatation. Small fluid collections in the area the pancreatic head and neck, suspicious for small pancreatic pseudocyst. Smaller free fluid in the upper abdomen and bilateral pleural effusions, are also likely related to pancreatitis. Consider abdomen CT with contrast for further evaluation if clinically warranted. Electronically Signed   By: Earle Gell M.D.   On: 04/10/2016 13:16   Dg Chest Portable 1 View  04/09/2016  CLINICAL DATA:  Patient reports intermittent shortness of breath x1 week. Per patient progressively worse this morning at 4 am to point he could not lay down. Hx of HTN, heart murmur, diabetes. Ex-smoker. EXAM: PORTABLE CHEST 1 VIEW COMPARISON:  03/16/2016 FINDINGS: Extensive opacification over the lower 2/3 of the left lung consistent with large effusion and underlying consolidation. Shift of the heart mediastinal contents toward the right. Heart size difficult to assess therefore. Right lung appears clear. IMPRESSION: Extensive abnormal opacity left thorax consistent with large effusion and underlying consolidation. Electronically Signed   By: Skipper Cliche M.D.   On: 04/09/2016  09:30   US Thoracentesis Asp Pleural Space W/img Guide  04/12/2016  INDICATION: LEFT pleural effusion EXAM: ULTRASOUND GUIDED THERAPEUTIC LEFT THORACENTESIS PROCEDURE:  Procedure, benefits, and risks of procedure were discussed with patient. Written informed consent for procedure was obtained. Time out protocol followed. Pleural effusion localized by ultrasound at the posterior LEFT hemithorax. Skin prepped and draped in usual sterile fashion. Skin and soft tissues anesthetized with 10 mL of 1% lidocaine. 8 French thoracentesis catheter placed into the LEFT pleural space. 1.63 L of dark old bloody fluid was aspirated by syringe pump. Procedure tolerated well by patient without immediate complication. No pneumothorax on postprocedural CT. FINDINGS: As above IMPRESSION: Successful ultrasound guided LEFT thoracentesis yielding 1.63 L of old bloody pleural fluid. Electronically Signed   By: Lavonia Dana M.D.   On: 04/12/2016 12:11    Labs: Basic Metabolic Panel:  Recent Labs Lab 04/17/16 1609 04/19/16 0520  NA 130* 134*  K 4.6 4.1  CL 98* 103  CO2 23 23  GLUCOSE 166* 129*  BUN 21* 28*  CREATININE 1.23 1.45*  CALCIUM 8.7* 8.3*   Liver Function Tests:  Recent Labs Lab 04/17/16 1609  AST 48*  ALT 38  ALKPHOS 101  BILITOT 1.2  PROT 6.8  ALBUMIN 2.8*    Recent Labs Lab 04/17/16 1609  LIPASE 102*  CBC:  Recent Labs Lab 04/14/16 0443 04/15/16 0659 04/16/16 0556 04/18/16 0438 04/19/16 0520  WBC 8.1 9.7 7.3 18.5* 12.4*  HGB 11.0* 11.1* 10.3* 11.1* 10.9*  HCT 33.2* 33.1* 30.8* 33.2* 32.1*  MCV 88.1 86.4 87.7 86.9 87.2  PLT 400 356 379 509* 502*    CBG:  Recent Labs Lab 04/19/16 1130 04/19/16 1614 04/19/16 2106 04/20/16 0802 04/20/16 1152  GLUCAP 113* 147* 135* 116* 148*     Signed:  Aadya Kindler P. Sarajane Jews, MD.  [facility] 04/20/2016, 4:55 PM   By signing my name below, I, Rennis Harding attest that this documentation has been prepared under the direction and in the presence of Murray Hodgkins, MD Electronically signed: Rennis Harding  04/20/2016 9:36am   I personally performed the services described in this  documentation. All medical record entries made by the scribe were at my direction. I have reviewed the chart and agree that the record reflects my personal performance and is accurate and complete. Murray Hodgkins, MD

## 2016-04-20 NOTE — Progress Notes (Signed)
PROGRESS NOTE  [name] 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 Chest x-ray revealed extensive effusion with underlying consolidation in the left. Chest CT confirmed a large left pleural effusion with compressive atelectasis, irregular left lower lobe bronchus. The patient underwent thoracentesis in the emergency department with removal of 800 mL he was seen by pulmonology with recommendations to repeat thoracentesis and chest CT. After second thoracentesis, repeat chest CT revealed persistent abnormal appearance of the bronchus and bronchoscopy was performed without complication. Patient's warfarin was held for this procedure and resumed post procedure and plans were made for discharge once the patient's INR was therapeutic. Pancreatic abnormalities were noted and gastroenterology saw the patient with recommendations as below. Patient developed atrial fibrillation which converted with IV beta blocker but recurred.   Assessment/Plan: 1. Large left pleural effusion with associated left lung compressive atelectasis and irregular left lower lobe bronchus. Asymptomatic. Status post thoracentesis 2 area status post bronchoscopy with biopsy 5/5 with benign results. Possibly related to previous pancreatitis. No evidence of infection or malignancy. 2. 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 INR therapeutic. Follow-up ultrasound suggested the vessel was not patent. Dr. Whitney Muse recommended continuing anticoagulation and follow-up in the outpatient setting. Treated with Lovenox until INR therapeutic. Continued on warfarin. 3. Atrial fibrillation/flutter with rapid ventricular response. Seen 5/8 and again 5/10. Currently in sinus rhythm. CHA2DS2-VASc = 6.  Already on warfarin. Of note patient was anticoagulated prior to onset. TSH was within normal limits. Echocardiogram as below. 4. Normocytic anemia. 5. Grade 1 diastolic dysfunction. Appears stable. 6. Abnormal pancreas. 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  CT of chest done on admission indicated peripancreatic fluid density and poor enhancement of the pancreatic head concerning for necrosis. GI will follow-up as an outpatient. 7. DM type 2. Continue SSI. Restart metformin on discharge. 8. Constipation. Resolved.   Overall doing well. Respiratory status stable. INR therapeutic. Remains in sinus rhythm.  Home today.  Repeat CT of the abdomen and pelvis (pancreatic protocol CT) in 4-6 weeks, will be arranged by GI.   DVT prophylaxis: lovenox Code Status: Full code Family Communication: Discussed with patient and wife Disposition Plan: Discharge to home  Murray Hodgkins, MD  [facility] Direct contact:  --Via amion app OR  --[email or web address]; password [email or web address] and click  [email or web address] contact night coverage as above 04/20/2016, 8:02 AM  LOS: 11 days   Consultants:   Pulmonology, Dr. Luan Pulling  Gastroenterology  Procedures:   Thoracentesis on 4/30 with removal of 850 mL of fluid  Thoracentesis on 5/3 with removal of 1.6 L of fluid  Bronchoscopy 5/5  2-D echocardiogram:Study Conclusions - Left ventricle: The cavity size was normal. Wall thickness was  increased in a pattern of moderate to severe LVH. Systolic  function was normal. The estimated ejection fraction was in the  range of 60% to 65%. Wall motion was normal; there were no  regional wall motion abnormalities. Doppler parameters are  consistent with abnormal left ventricular relaxation (grade 1  diastolic dysfunction). - Aortic valve: Mildly calcified annulus. Trileaflet. There was  mild regurgitation. - Mitral valve: Calcified annulus. There was trivial regurgitation. -  Left atrium: The atrium was mildly dilated. - Right atrium: Central venous pressure (est): 3 mm Hg. - Tricuspid valve: There was mild regurgitation. -  Pulmonic valve: There was mild regurgitation. - Pulmonary arteries: PA peak pressure: 33 mm Hg (S). - Pericardium, extracardiac: A trivial pericardial effusion was  identified posterior to the heart. Bilateral pleural effusions noted, largest on the left.  Antimicrobials:  none  HPI/Subjective: Feel pretty good. Denies any pain, nausea, or vomiting. Was able to eat without difficulty.   Objective: Filed Vitals:   04/19/16 1230 04/19/16 1315 04/19/16 2156 04/20/16 0642  BP: 109/83 115/58 116/54 122/55  Pulse: 72 69 74 72  Temp:  98.3 F (36.8 C) 98.2 F (36.8 C) 98.5 F (36.9 C)  TempSrc:  Oral Oral Oral  Resp:  20 22 20   Height:      Weight:    69.945 kg (154 lb 3.2 oz)  SpO2:  96% 95% 96%    Intake/Output Summary (Last 24 hours) at 04/20/16 0802 Last data filed at 04/20/16 [id number]  Gross per 24 hour  Intake 880.83 ml  Output    400 ml  Net 480.83 ml     Filed Weights   04/18/16 0500 04/19/16 0500 04/20/16 0642  Weight: 65.409 kg (144 lb 3.2 oz) 67.45 kg (148 lb 11.2 oz) 69.945 kg (154 lb 3.2 oz)    Exam:    Constitutional:  . Appears calm and comfortable, sitting up in room chair.  Respiratory:  . CTA bilaterally, no w/r/r.  . Respiratory effort normal. No retractions or accessory muscle use Cardiovascular:  . RRR . 2/6 systolic murmur LUSB . No Lower extremity edema   . Telemetry SR, NSVT x4 Psychiatric:  . judgement and insight appear normal . Mental status o Mood, affect appropriate  I have personally reviewed following labs and imaging studies:  INR therapeutic at 2.19.  CBG stable.  Scheduled Meds: . docusate sodium  100 mg Oral BID  . febuxostat  80 mg Oral Daily  . finasteride  5 mg Oral Daily  . furosemide  40 mg Oral Daily  . hydrALAZINE  50 mg Oral TID  . insulin aspart  0-5 Units  Subcutaneous QHS  . insulin aspart  0-9 Units Subcutaneous TID WC  . lipase/protease/amylase  12,000 Units Oral TID WC  . loratadine  10 mg Oral q1800  . metoprolol succinate  100 mg Oral Daily  . niacin  1,000 mg Oral QHS  . pantoprazole  40 mg Oral Daily  . potassium chloride  20 mEq Oral Daily  . pravastatin  40 mg Oral Daily  . sodium chloride flush  3 mL Intravenous Q12H  . warfarin  6 mg Oral Q24H  . Warfarin - Pharmacist Dosing Inpatient   Does not apply Q24H   Continuous Infusions:    Principal Problem:   Pleural effusion, left Active Problems:   BPH (benign prostatic hyperplasia)   Embolism and thrombosis of splenic artery   Abnormal CT scan, pancreas or bile duct   Elevated lipase   Essential hypertension   Type 2 diabetes mellitus without complications (HCC)   Pleural effusion on left   Atrial fibrillation ([place])   LOS: 11 days   By signing my name below, I, Rennis Harding attest that this documentation has been prepared under the direction and in the presence of Murray Hodgkins, MD Electronically signed: Rennis Harding  04/20/2016 9:36am   I personally performed the services described in this documentation. All medical record entries made by the scribe were at my direction. I have reviewed the chart and agree that the record reflects my personal performance and is accurate and  complete. Murray Hodgkins, MD

## 2016-04-20 NOTE — Progress Notes (Signed)
Pt discharged home today per Dr. Goodrich. Pt's IV site D/C'd and WDL. Pt's VSS. Pt provided with home medication list, discharge instructions and prescriptions. Verbalized understanding. Pt left floor via WC in stable condition accompanied by NT. 

## 2016-04-21 ENCOUNTER — Telehealth: Payer: Self-pay | Admitting: *Deleted

## 2016-04-21 NOTE — Telephone Encounter (Signed)
Call Completed and Appointment Scheduled: Yes, Date: 04/24/16 with Dr Dettinger   DISCHARGE INFORMATION Date of Discharge:04/20/16  Discharge Facility: [facility]  Principal Discharge Diagnosis: Pleural Effusion  Patient and/or caregiver is knowledgeable of his/her condition(s) and treatment: Yes  MEDICATION RECONCILIATION Current medication list reviewed with patient:Yes Discharge Medications reviewed and reconciled with current medications.yes  Patient is able to obtain needed medications:Yes  ACTIVITIES OF DAILY LIVING  Is the patient able to perform his/her own ADLs: Yes.    Patient is receiving home health services: No.  PATIENT EDUCATION Questions/Concerns Discussed: Spoke with patient's wife. He continues to have some abd discomfort which is unchanged since being in the hospital. Reports having a normal BM last night. Appt rescheduled to [facility] 04/24/16. Advised to call back if symptoms worsen or if any new symptoms develop. Wife stated understanding and agreement to plan.

## 2016-04-22 DIAGNOSIS — R590 Localized enlarged lymph nodes: Secondary | ICD-10-CM | POA: Diagnosis not present

## 2016-04-22 DIAGNOSIS — Z7984 Long term (current) use of oral hypoglycemic drugs: Secondary | ICD-10-CM | POA: Diagnosis not present

## 2016-04-22 DIAGNOSIS — R0602 Shortness of breath: Secondary | ICD-10-CM | POA: Diagnosis not present

## 2016-04-22 DIAGNOSIS — J90 Pleural effusion, not elsewhere classified: Secondary | ICD-10-CM | POA: Diagnosis not present

## 2016-04-22 DIAGNOSIS — R109 Unspecified abdominal pain: Secondary | ICD-10-CM | POA: Diagnosis not present

## 2016-04-22 DIAGNOSIS — Z79899 Other long term (current) drug therapy: Secondary | ICD-10-CM | POA: Diagnosis not present

## 2016-04-22 DIAGNOSIS — E119 Type 2 diabetes mellitus without complications: Secondary | ICD-10-CM | POA: Diagnosis not present

## 2016-04-22 DIAGNOSIS — K219 Gastro-esophageal reflux disease without esophagitis: Secondary | ICD-10-CM | POA: Diagnosis not present

## 2016-04-22 DIAGNOSIS — Z7951 Long term (current) use of inhaled steroids: Secondary | ICD-10-CM | POA: Diagnosis not present

## 2016-04-22 DIAGNOSIS — I480 Paroxysmal atrial fibrillation: Secondary | ICD-10-CM | POA: Diagnosis not present

## 2016-04-22 DIAGNOSIS — Z7401 Bed confinement status: Secondary | ICD-10-CM | POA: Diagnosis not present

## 2016-04-22 DIAGNOSIS — D735 Infarction of spleen: Secondary | ICD-10-CM | POA: Diagnosis not present

## 2016-04-22 DIAGNOSIS — R918 Other nonspecific abnormal finding of lung field: Secondary | ICD-10-CM | POA: Diagnosis not present

## 2016-04-22 DIAGNOSIS — K8511 Biliary acute pancreatitis with uninfected necrosis: Secondary | ICD-10-CM | POA: Diagnosis not present

## 2016-04-22 DIAGNOSIS — Z7952 Long term (current) use of systemic steroids: Secondary | ICD-10-CM | POA: Diagnosis not present

## 2016-04-22 DIAGNOSIS — E785 Hyperlipidemia, unspecified: Secondary | ICD-10-CM | POA: Diagnosis not present

## 2016-04-22 DIAGNOSIS — I82890 Acute embolism and thrombosis of other specified veins: Secondary | ICD-10-CM | POA: Diagnosis not present

## 2016-04-22 DIAGNOSIS — Z4682 Encounter for fitting and adjustment of non-vascular catheter: Secondary | ICD-10-CM | POA: Diagnosis not present

## 2016-04-22 DIAGNOSIS — D473 Essential (hemorrhagic) thrombocythemia: Secondary | ICD-10-CM | POA: Diagnosis not present

## 2016-04-22 DIAGNOSIS — I4891 Unspecified atrial fibrillation: Secondary | ICD-10-CM | POA: Diagnosis not present

## 2016-04-22 DIAGNOSIS — N400 Enlarged prostate without lower urinary tract symptoms: Secondary | ICD-10-CM | POA: Diagnosis not present

## 2016-04-22 DIAGNOSIS — R591 Generalized enlarged lymph nodes: Secondary | ICD-10-CM | POA: Diagnosis not present

## 2016-04-22 DIAGNOSIS — K863 Pseudocyst of pancreas: Secondary | ICD-10-CM | POA: Diagnosis not present

## 2016-04-22 DIAGNOSIS — K8591 Acute pancreatitis with uninfected necrosis, unspecified: Secondary | ICD-10-CM | POA: Diagnosis not present

## 2016-04-22 DIAGNOSIS — K861 Other chronic pancreatitis: Secondary | ICD-10-CM | POA: Diagnosis not present

## 2016-04-22 DIAGNOSIS — R1084 Generalized abdominal pain: Secondary | ICD-10-CM | POA: Diagnosis not present

## 2016-04-22 DIAGNOSIS — Z7901 Long term (current) use of anticoagulants: Secondary | ICD-10-CM | POA: Diagnosis not present

## 2016-04-22 DIAGNOSIS — Z792 Long term (current) use of antibiotics: Secondary | ICD-10-CM | POA: Diagnosis not present

## 2016-04-22 DIAGNOSIS — D72829 Elevated white blood cell count, unspecified: Secondary | ICD-10-CM | POA: Diagnosis not present

## 2016-04-22 DIAGNOSIS — R748 Abnormal levels of other serum enzymes: Secondary | ICD-10-CM | POA: Diagnosis not present

## 2016-04-22 DIAGNOSIS — Z87891 Personal history of nicotine dependence: Secondary | ICD-10-CM | POA: Diagnosis not present

## 2016-04-22 DIAGNOSIS — J9811 Atelectasis: Secondary | ICD-10-CM | POA: Diagnosis not present

## 2016-04-22 DIAGNOSIS — Z9049 Acquired absence of other specified parts of digestive tract: Secondary | ICD-10-CM | POA: Diagnosis not present

## 2016-04-22 DIAGNOSIS — K869 Disease of pancreas, unspecified: Secondary | ICD-10-CM | POA: Diagnosis not present

## 2016-04-22 DIAGNOSIS — K8689 Other specified diseases of pancreas: Secondary | ICD-10-CM | POA: Diagnosis not present

## 2016-04-22 DIAGNOSIS — K859 Acute pancreatitis without necrosis or infection, unspecified: Secondary | ICD-10-CM | POA: Diagnosis not present

## 2016-04-22 DIAGNOSIS — Z888 Allergy status to other drugs, medicaments and biological substances status: Secondary | ICD-10-CM | POA: Diagnosis not present

## 2016-04-22 DIAGNOSIS — R1013 Epigastric pain: Secondary | ICD-10-CM | POA: Diagnosis not present

## 2016-04-22 DIAGNOSIS — E440 Moderate protein-calorie malnutrition: Secondary | ICD-10-CM | POA: Diagnosis not present

## 2016-04-22 DIAGNOSIS — I10 Essential (primary) hypertension: Secondary | ICD-10-CM | POA: Diagnosis not present

## 2016-04-22 DIAGNOSIS — R188 Other ascites: Secondary | ICD-10-CM | POA: Diagnosis not present

## 2016-04-24 ENCOUNTER — Ambulatory Visit: Payer: Medicare Other | Admitting: Family Medicine

## 2016-04-25 ENCOUNTER — Encounter: Payer: Self-pay | Admitting: Family Medicine

## 2016-04-28 ENCOUNTER — Ambulatory Visit: Payer: Medicare Other | Admitting: Family Medicine

## 2016-05-01 DIAGNOSIS — K8689 Other specified diseases of pancreas: Secondary | ICD-10-CM | POA: Diagnosis not present

## 2016-05-02 ENCOUNTER — Other Ambulatory Visit: Payer: Self-pay | Admitting: Urology

## 2016-05-02 DIAGNOSIS — C61 Malignant neoplasm of prostate: Secondary | ICD-10-CM

## 2016-05-05 DIAGNOSIS — K863 Pseudocyst of pancreas: Secondary | ICD-10-CM | POA: Diagnosis not present

## 2016-05-07 DIAGNOSIS — K863 Pseudocyst of pancreas: Secondary | ICD-10-CM | POA: Diagnosis not present

## 2016-05-07 DIAGNOSIS — E119 Type 2 diabetes mellitus without complications: Secondary | ICD-10-CM | POA: Diagnosis not present

## 2016-05-07 DIAGNOSIS — I10 Essential (primary) hypertension: Secondary | ICD-10-CM | POA: Diagnosis not present

## 2016-05-07 DIAGNOSIS — K8681 Exocrine pancreatic insufficiency: Secondary | ICD-10-CM | POA: Diagnosis not present

## 2016-05-07 DIAGNOSIS — K861 Other chronic pancreatitis: Secondary | ICD-10-CM | POA: Diagnosis not present

## 2016-05-07 DIAGNOSIS — D735 Infarction of spleen: Secondary | ICD-10-CM | POA: Diagnosis not present

## 2016-05-07 DIAGNOSIS — N179 Acute kidney failure, unspecified: Secondary | ICD-10-CM | POA: Diagnosis not present

## 2016-05-07 DIAGNOSIS — R6511 Systemic inflammatory response syndrome (SIRS) of non-infectious origin with acute organ dysfunction: Secondary | ICD-10-CM | POA: Diagnosis not present

## 2016-05-07 DIAGNOSIS — J9819 Other pulmonary collapse: Secondary | ICD-10-CM | POA: Diagnosis not present

## 2016-05-07 DIAGNOSIS — E43 Unspecified severe protein-calorie malnutrition: Secondary | ICD-10-CM | POA: Diagnosis not present

## 2016-05-07 DIAGNOSIS — E46 Unspecified protein-calorie malnutrition: Secondary | ICD-10-CM | POA: Diagnosis not present

## 2016-05-07 DIAGNOSIS — K8689 Other specified diseases of pancreas: Secondary | ICD-10-CM | POA: Diagnosis not present

## 2016-05-07 DIAGNOSIS — I82890 Acute embolism and thrombosis of other specified veins: Secondary | ICD-10-CM | POA: Diagnosis not present

## 2016-05-07 DIAGNOSIS — J90 Pleural effusion, not elsewhere classified: Secondary | ICD-10-CM | POA: Diagnosis not present

## 2016-05-07 DIAGNOSIS — E860 Dehydration: Secondary | ICD-10-CM | POA: Diagnosis not present

## 2016-05-07 DIAGNOSIS — Z8249 Family history of ischemic heart disease and other diseases of the circulatory system: Secondary | ICD-10-CM | POA: Diagnosis not present

## 2016-05-07 DIAGNOSIS — E785 Hyperlipidemia, unspecified: Secondary | ICD-10-CM | POA: Diagnosis not present

## 2016-05-07 DIAGNOSIS — K8591 Acute pancreatitis with uninfected necrosis, unspecified: Secondary | ICD-10-CM | POA: Diagnosis not present

## 2016-05-07 DIAGNOSIS — R509 Fever, unspecified: Secondary | ICD-10-CM | POA: Diagnosis not present

## 2016-05-07 DIAGNOSIS — I4891 Unspecified atrial fibrillation: Secondary | ICD-10-CM | POA: Diagnosis not present

## 2016-05-07 DIAGNOSIS — R0600 Dyspnea, unspecified: Secondary | ICD-10-CM | POA: Diagnosis not present

## 2016-05-07 DIAGNOSIS — I480 Paroxysmal atrial fibrillation: Secondary | ICD-10-CM | POA: Diagnosis not present

## 2016-05-07 DIAGNOSIS — D72829 Elevated white blood cell count, unspecified: Secondary | ICD-10-CM | POA: Diagnosis not present

## 2016-05-07 DIAGNOSIS — M109 Gout, unspecified: Secondary | ICD-10-CM | POA: Diagnosis not present

## 2016-05-07 DIAGNOSIS — Z87891 Personal history of nicotine dependence: Secondary | ICD-10-CM | POA: Diagnosis not present

## 2016-05-07 DIAGNOSIS — N400 Enlarged prostate without lower urinary tract symptoms: Secondary | ICD-10-CM | POA: Diagnosis not present

## 2016-05-07 DIAGNOSIS — A419 Sepsis, unspecified organism: Secondary | ICD-10-CM | POA: Diagnosis not present

## 2016-05-07 DIAGNOSIS — J860 Pyothorax with fistula: Secondary | ICD-10-CM | POA: Diagnosis not present

## 2016-05-07 DIAGNOSIS — K219 Gastro-esophageal reflux disease without esophagitis: Secondary | ICD-10-CM | POA: Diagnosis not present

## 2016-05-07 DIAGNOSIS — J918 Pleural effusion in other conditions classified elsewhere: Secondary | ICD-10-CM | POA: Diagnosis not present

## 2016-05-07 DIAGNOSIS — R0602 Shortness of breath: Secondary | ICD-10-CM | POA: Diagnosis not present

## 2016-05-07 DIAGNOSIS — Z888 Allergy status to other drugs, medicaments and biological substances status: Secondary | ICD-10-CM | POA: Diagnosis not present

## 2016-05-09 LAB — FUNGUS CULTURE WITH STAIN

## 2016-05-09 LAB — FUNGUS CULTURE RESULT

## 2016-05-09 LAB — FUNGAL ORGANISM REFLEX

## 2016-05-11 DIAGNOSIS — K863 Pseudocyst of pancreas: Secondary | ICD-10-CM | POA: Diagnosis not present

## 2016-05-12 NOTE — Discharge Summary (Signed)
[name], Alan NO.:  [id number]  MEDICAL RECORD NO.:  [id number]  LOCATION:  A320                          FACILITY:  [facility]  PHYSICIAN:  Alexx Mcburney L. Luan [name], M.D.DATE OF BIRTH:  [birth year]  DATE OF ADMISSION:  04/09/2016 DATE OF DISCHARGE:  05/11/2017LH                              DISCHARGE SUMMARY   ADDENDUM:  Brushings of the left lower lobe bronchus.     Alan [name] L. Luan [name], M.D.     ELH/MEDQ  D:  05/11/2016  T:  05/12/2016  Job:  [id number]

## 2016-05-14 DIAGNOSIS — K859 Acute pancreatitis without necrosis or infection, unspecified: Secondary | ICD-10-CM | POA: Diagnosis not present

## 2016-05-14 DIAGNOSIS — J90 Pleural effusion, not elsewhere classified: Secondary | ICD-10-CM | POA: Diagnosis not present

## 2016-05-14 DIAGNOSIS — K863 Pseudocyst of pancreas: Secondary | ICD-10-CM | POA: Diagnosis not present

## 2016-05-15 ENCOUNTER — Ambulatory Visit: Payer: Self-pay | Admitting: Gastroenterology

## 2016-05-15 ENCOUNTER — Encounter: Payer: Self-pay | Admitting: Gastroenterology

## 2016-05-15 ENCOUNTER — Telehealth: Payer: Self-pay | Admitting: Gastroenterology

## 2016-05-15 DIAGNOSIS — K863 Pseudocyst of pancreas: Secondary | ICD-10-CM | POA: Diagnosis not present

## 2016-05-15 NOTE — Telephone Encounter (Signed)
PATIENT WAS A NO SHOW AND LETTER SENT  °

## 2016-05-16 ENCOUNTER — Telehealth: Payer: Self-pay

## 2016-05-16 NOTE — Telephone Encounter (Signed)
x

## 2016-05-17 ENCOUNTER — Encounter: Payer: Self-pay | Admitting: Family Medicine

## 2016-05-17 ENCOUNTER — Ambulatory Visit (INDEPENDENT_AMBULATORY_CARE_PROVIDER_SITE_OTHER): Payer: Medicare Other | Admitting: Family Medicine

## 2016-05-17 VITALS — BP 119/59 | HR 80 | Temp 96.7°F | Ht 65.0 in | Wt 128.6 lb

## 2016-05-17 DIAGNOSIS — D6489 Other specified anemias: Secondary | ICD-10-CM

## 2016-05-17 DIAGNOSIS — J948 Other specified pleural conditions: Secondary | ICD-10-CM | POA: Diagnosis not present

## 2016-05-17 DIAGNOSIS — R932 Abnormal findings on diagnostic imaging of liver and biliary tract: Secondary | ICD-10-CM | POA: Diagnosis not present

## 2016-05-17 DIAGNOSIS — J90 Pleural effusion, not elsewhere classified: Secondary | ICD-10-CM

## 2016-05-17 NOTE — Progress Notes (Signed)
BP 119/59 mmHg  Pulse 80  Temp(Src) 96.7 F (35.9 C) (Oral)  Ht _0  (1.651 m)  Wt 128 lb 9.6 oz (58.333 kg)  BMI 21.40 kg/m2   Subjective:    Patient ID: Alan Netters., male    DOB: 08/01/1941, 75 y.o.   MRN: [id number]  HPI: Alan Jake. is a 75 y.o. male presenting on 05/17/2016 for Hospital followup   HPI Hospital follow-up for pleural effusion and pancreatic abnormalities Patient is coming in today for hospital follow-up. He was admitted to [facility] for 2 weeks for what they told him was necrotizing pancreatitis with a pleural effusion. During his hospital stay he had multiple fluid Through his lungs and was placed on TPN which he is still on currently and they are not allowing him to have any oral intake. Because of this he has been losing weight and he feels weak and has a lack of energy. His diabetes has been under a lot better control number wise and he had to back off on his insulins because of this as well. Today he denies any abdominal pain or shortness of breath and says he is feeling a lot better than what he was previously. He denies any fevers or chills or cough. He was also anemic during the hospital stay and is due for recheck on that today.  Relevant past medical, surgical, family and social history reviewed and updated as indicated. Interim medical history since our last visit reviewed. Allergies and medications reviewed and updated.  Review of Systems  Constitutional: Negative for fever.  HENT: Negative for congestion, ear discharge, ear pain, rhinorrhea, sinus pressure and sneezing.   Eyes: Negative for discharge and visual disturbance.  Respiratory: Negative for cough, chest tightness, shortness of breath and wheezing.   Cardiovascular: Negative for chest pain and leg swelling.  Gastrointestinal: Negative for abdominal pain, diarrhea, constipation and abdominal distention.  Genitourinary: Negative for difficulty urinating.  Musculoskeletal: Negative  for back pain and gait problem.  Skin: Negative for rash.  Neurological: Negative for dizziness, syncope, light-headedness and headaches.  All other systems reviewed and are negative.   Per HPI unless specifically indicated above     Medication List       This list is accurate as of: 05/17/16 12:21 PM.  Always use your most recent med list.               amLODipine 10 MG tablet  Commonly known as:  NORVASC  Take 1 tablet (10 mg total) by mouth every evening.     benazepril 40 MG tablet  Commonly known as:  LOTENSIN  TAKE ONE TABLET BY MOUTH ONCE DAILY     febuxostat 40 MG tablet  Commonly known as:  ULORIC  Take 2 tablets (80 mg total) by mouth daily.     finasteride 5 MG tablet  Commonly known as:  PROSCAR  Take 5 mg by mouth daily.     fluticasone 50 MCG/ACT nasal spray  Commonly known as:  FLONASE  Place 1 spray into both nostrils 2 (two) times daily as needed for allergies or rhinitis.     furosemide 40 MG tablet  Commonly known as:  LASIX  Take 1 tablet (40 mg total) by mouth daily as needed for fluid.     hydrALAZINE 50 MG tablet  Commonly known as:  APRESOLINE  Take 1 tablet (50 mg total) by mouth 3 (three) times daily.     levocetirizine 5 MG tablet  Commonly known as:  XYZAL  Take 1 tablet (5 mg total) by mouth every evening.     metFORMIN 500 MG tablet  Commonly known as:  GLUCOPHAGE  TAKE ONE TABLET BY MOUTH TWICE DAILY WITH A MEAL     metoprolol succinate 100 MG 24 hr tablet  Commonly known as:  TOPROL-XL  Take 1 tablet (100 mg total) by mouth 1 day or 1 dose.     niacin 1000 MG CR tablet  Commonly known as:  NIASPAN  Take 1 tablet (1,000 mg total) by mouth at bedtime.     omeprazole 20 MG capsule  Commonly known as:  PRILOSEC  Take 1 capsule (20 mg total) by mouth daily.     pravastatin 40 MG tablet  Commonly known as:  PRAVACHOL  Take 40 mg by mouth daily.     warfarin 1 MG tablet  Commonly known as:  COUMADIN  Take 1 tablet (1 mg  total) by mouth daily.     warfarin 5 MG tablet  Commonly known as:  COUMADIN  TAKE ONE TABLET BY MOUTH ONCE DAILY AT  6  PM           Objective:    BP 119/59 mmHg  Pulse 80  Temp(Src) 96.7 F (35.9 C) (Oral)  Ht _0  (1.651 m)  Wt 128 lb 9.6 oz (58.333 kg)  BMI 21.40 kg/m2  Wt Readings from Last 3 Encounters:  05/17/16 128 lb 9.6 oz (58.333 kg)  04/20/16 154 lb 3.2 oz (69.945 kg)  03/10/16 157 lb (71.215 kg)    Physical Exam  Constitutional: He is oriented to person, place, and time. He appears well-developed and well-nourished. No distress.  HENT:  Right Ear: External ear normal.  Left Ear: External ear normal.  Nose: Nose normal.  Mouth/Throat: Oropharynx is clear and moist. No oropharyngeal exudate.  Eyes: Conjunctivae and EOM are normal. Pupils are equal, round, and reactive to light. Right eye exhibits no discharge. No scleral icterus.  Neck: Neck supple. No thyromegaly present.  Cardiovascular: Normal rate, regular rhythm, normal heart sounds and intact distal pulses.   No murmur heard. Pulmonary/Chest: Effort normal. No respiratory distress. He has no wheezes. He has rales (Left lower lobe).  Abdominal: Soft. Bowel sounds are normal. He exhibits no distension. There is tenderness (Mild epigastric). There is no rebound and no guarding.  Musculoskeletal: Normal range of motion. He exhibits no edema.  Lymphadenopathy:    He has no cervical adenopathy.  Neurological: He is alert and oriented to person, place, and time. Coordination normal.  Skin: Skin is warm and dry. No rash noted. He is not diaphoretic.  Psychiatric: He has a normal mood and affect. His behavior is normal.  Nursing note and vitals reviewed.     Assessment & Plan:   Problem List Items Addressed This Visit      Respiratory   Pleural effusion, left - Primary   Relevant Orders   CMP14+EGFR (Completed)     Other   Abnormal CT scan, pancreas or bile duct    Other Visit Diagnoses    Anemia  due to other cause        Relevant Orders    CBC with Differential/Platelet (Completed)       Follow up plan: Return in about 3 months (around 08/17/2016), or if symptoms worsen or fail to improve, for Follow-up after surgeon follows with pancreatitis.  Counseling provided for all of the vaccine components Orders Placed This Encounter  Procedures  .  CMP14+EGFR  . CBC with Differential/Platelet    Caryl Pina, MD [facility] 05/17/2016, 12:21 PM

## 2016-05-18 LAB — CMP14+EGFR
ALBUMIN: 2.7 g/dL — AB (ref 3.5–4.8)
ALT: 62 IU/L — ABNORMAL HIGH (ref 0–44)
AST: 58 IU/L — ABNORMAL HIGH (ref 0–40)
Albumin/Globulin Ratio: 0.7 — ABNORMAL LOW (ref 1.2–2.2)
Alkaline Phosphatase: 185 IU/L — ABNORMAL HIGH (ref 39–117)
BUN / CREAT RATIO: 48 — AB (ref 10–24)
BUN: 39 mg/dL — ABNORMAL HIGH (ref 8–27)
Bilirubin Total: 0.3 mg/dL (ref 0.0–1.2)
CO2: 25 mmol/L (ref 18–29)
CREATININE: 0.81 mg/dL (ref 0.76–1.27)
Calcium: 8.9 mg/dL (ref 8.6–10.2)
Chloride: 105 mmol/L (ref 96–106)
GFR calc non Af Amer: 88 mL/min/{1.73_m2} (ref >59)
GFR, EST AFRICAN AMERICAN: 102 mL/min/{1.73_m2} (ref >59)
GLOBULIN, TOTAL: 4.1 g/dL (ref 1.5–4.5)
Glucose: 127 mg/dL — ABNORMAL HIGH (ref 65–99)
Potassium: 4.2 mmol/L (ref 3.5–5.2)
SODIUM: 146 mmol/L — AB (ref 134–144)
Total Protein: 6.8 g/dL (ref 6.0–8.5)

## 2016-05-18 LAB — CBC WITH DIFFERENTIAL/PLATELET
BASOS: 0 %
Basophils Absolute: 0 10*3/uL (ref 0.0–0.2)
EOS (ABSOLUTE): 0 10*3/uL (ref 0.0–0.4)
EOS: 0 %
HEMATOCRIT: 32.8 % — AB (ref 37.5–51.0)
HEMOGLOBIN: 10.3 g/dL — AB (ref 12.6–17.7)
Immature Grans (Abs): 0 10*3/uL (ref 0.0–0.1)
Immature Granulocytes: 0 %
LYMPHS ABS: 1.5 10*3/uL (ref 0.7–3.1)
Lymphs: 14 %
MCH: 27.8 pg (ref 26.6–33.0)
MCHC: 31.4 g/dL — ABNORMAL LOW (ref 31.5–35.7)
MCV: 89 fL (ref 79–97)
MONOS ABS: 1.1 10*3/uL — AB (ref 0.1–0.9)
Monocytes: 10 %
NEUTROS ABS: 8.1 10*3/uL — AB (ref 1.4–7.0)
Neutrophils: 76 %
Platelets: 495 10*3/uL — ABNORMAL HIGH (ref 150–379)
RBC: 3.7 x10E6/uL — AB (ref 4.14–5.80)
RDW: 16.7 % — ABNORMAL HIGH (ref 12.3–15.4)
WBC: 10.7 10*3/uL (ref 3.4–10.8)

## 2016-05-19 ENCOUNTER — Telehealth: Payer: Self-pay | Admitting: Family Medicine

## 2016-05-19 DIAGNOSIS — K863 Pseudocyst of pancreas: Secondary | ICD-10-CM | POA: Diagnosis not present

## 2016-05-19 NOTE — Telephone Encounter (Signed)
Patients wife aware of results.

## 2016-05-21 ENCOUNTER — Other Ambulatory Visit: Payer: Self-pay | Admitting: Family Medicine

## 2016-05-21 DIAGNOSIS — K8500 Idiopathic acute pancreatitis without necrosis or infection: Secondary | ICD-10-CM | POA: Diagnosis not present

## 2016-05-21 DIAGNOSIS — K863 Pseudocyst of pancreas: Secondary | ICD-10-CM | POA: Diagnosis not present

## 2016-05-22 ENCOUNTER — Other Ambulatory Visit (HOSPITAL_COMMUNITY)
Admission: AD | Admit: 2016-05-22 | Discharge: 2016-05-22 | Disposition: A | Discharge status: Home / Self Care | Payer: Medicare Other | Source: Skilled Nursing Facility | Attending: Internal Medicine | Admitting: Internal Medicine

## 2016-05-22 DIAGNOSIS — K859 Acute pancreatitis without necrosis or infection, unspecified: Secondary | ICD-10-CM | POA: Diagnosis not present

## 2016-05-22 DIAGNOSIS — K863 Pseudocyst of pancreas: Secondary | ICD-10-CM | POA: Diagnosis not present

## 2016-05-22 LAB — COMPREHENSIVE METABOLIC PANEL
ALT: 264 U/L — ABNORMAL HIGH (ref 17–63)
AST: 156 U/L — ABNORMAL HIGH (ref 15–41)
Albumin: 2 g/dL — ABNORMAL LOW (ref 3.5–5.0)
Alkaline Phosphatase: 345 U/L — ABNORMAL HIGH (ref 38–126)
Anion gap: 5 (ref 5–15)
BILIRUBIN TOTAL: 0.8 mg/dL (ref 0.3–1.2)
BUN: 54 mg/dL — ABNORMAL HIGH (ref 6–20)
CHLORIDE: 117 mmol/L — AB (ref 101–111)
CO2: 30 mmol/L (ref 22–32)
CREATININE: 0.89 mg/dL (ref 0.61–1.24)
Calcium: 8.7 mg/dL — ABNORMAL LOW (ref 8.9–10.3)
Glucose, Bld: 148 mg/dL — ABNORMAL HIGH (ref 65–99)
POTASSIUM: 3.8 mmol/L (ref 3.5–5.1)
Sodium: 152 mmol/L — ABNORMAL HIGH (ref 135–145)
TOTAL PROTEIN: 6.5 g/dL (ref 6.5–8.1)

## 2016-05-22 LAB — CBC WITH DIFFERENTIAL/PLATELET
BASOS ABS: 0 10*3/uL (ref 0.0–0.1)
Basophils Relative: 0 %
EOS ABS: 0 10*3/uL (ref 0.0–0.7)
Eosinophils Relative: 0 %
HCT: 32.2 % — ABNORMAL LOW (ref 39.0–52.0)
HEMOGLOBIN: 9.9 g/dL — AB (ref 13.0–17.0)
LYMPHS ABS: 1.4 10*3/uL (ref 0.7–4.0)
LYMPHS PCT: 12 %
MCH: 28 pg (ref 26.0–34.0)
MCHC: 30.7 g/dL (ref 30.0–36.0)
MCV: 91 fL (ref 78.0–100.0)
Monocytes Absolute: 0.9 10*3/uL (ref 0.1–1.0)
Monocytes Relative: 8 %
NEUTROS PCT: 80 %
Neutro Abs: 9.7 10*3/uL — ABNORMAL HIGH (ref 1.7–7.7)
PLATELETS: 416 10*3/uL — AB (ref 150–400)
RBC: 3.54 MIL/uL — AB (ref 4.22–5.81)
RDW: 17.5 % — ABNORMAL HIGH (ref 11.5–15.5)
WBC: 12 10*3/uL — AB (ref 4.0–10.5)

## 2016-05-22 LAB — MAGNESIUM: Magnesium: 2.4 mg/dL (ref 1.7–2.4)

## 2016-05-22 LAB — PREALBUMIN: PREALBUMIN: 13.4 mg/dL — AB (ref 18–38)

## 2016-05-22 LAB — PHOSPHORUS: PHOSPHORUS: 3.8 mg/dL (ref 2.5–4.6)

## 2016-05-22 LAB — LACTATE DEHYDROGENASE: LDH: 188 U/L (ref 98–192)

## 2016-05-24 ENCOUNTER — Telehealth: Payer: Self-pay

## 2016-05-24 NOTE — Telephone Encounter (Signed)
Stacey, please schedule ov.  

## 2016-05-24 NOTE — Telephone Encounter (Signed)
Noted. Very important he comes back in for follow-up. Let's reach out to him and see if we can arrange a visit. Make sure he is doing ok.

## 2016-05-24 NOTE — Telephone Encounter (Signed)
CALLED PATIENT AND SPOKE TO WIFE AND RESCHEDULED HIS APPOINTMENT

## 2016-05-24 NOTE — Telephone Encounter (Signed)
Anderson Malta at [facility] [phone],  she is handling Mr. Sellinger TPN and is concerned about his blood sugars.  She said that his wife is measuring his BS when he is on and off of his TPN.  They are running somewhere around the high 200's to 300's while on the TPN and around 190 when is off of the TPN.  He was taking the Metformin before his hospitalization but it was stopped while he was hospitalized.  She said that she is increasing the insulin tonight and his wife is to recheck his blood sugar in the morning and call her with the results.  She has faxed his orders for the TPN over here and wants you to take a look at them.  She would like to know if you recommend he restart the Metformin.  Please advise.

## 2016-05-25 ENCOUNTER — Ambulatory Visit: Payer: Self-pay | Admitting: Pharmacist

## 2016-05-25 DIAGNOSIS — I748 Embolism and thrombosis of other arteries: Secondary | ICD-10-CM

## 2016-05-25 NOTE — Telephone Encounter (Signed)
Spoke with pt's wife and also Anderson Malta at [facility] to inform of Dr Dettinger's recommendation Both verbalize understanding

## 2016-05-25 NOTE — Telephone Encounter (Signed)
Yes go ahead and start metformin 500 mg once daily and see how he does. On an empty stomach he may have issues with this.

## 2016-05-26 ENCOUNTER — Other Ambulatory Visit: Payer: Self-pay

## 2016-05-26 DIAGNOSIS — A419 Sepsis, unspecified organism: Secondary | ICD-10-CM | POA: Diagnosis not present

## 2016-05-26 DIAGNOSIS — K861 Other chronic pancreatitis: Secondary | ICD-10-CM | POA: Diagnosis not present

## 2016-05-26 DIAGNOSIS — K8591 Acute pancreatitis with uninfected necrosis, unspecified: Secondary | ICD-10-CM | POA: Diagnosis not present

## 2016-05-26 DIAGNOSIS — K8689 Other specified diseases of pancreas: Secondary | ICD-10-CM | POA: Diagnosis not present

## 2016-05-26 DIAGNOSIS — K851 Biliary acute pancreatitis without necrosis or infection: Secondary | ICD-10-CM | POA: Diagnosis not present

## 2016-05-26 DIAGNOSIS — K8681 Exocrine pancreatic insufficiency: Secondary | ICD-10-CM | POA: Diagnosis not present

## 2016-05-26 DIAGNOSIS — Z888 Allergy status to other drugs, medicaments and biological substances status: Secondary | ICD-10-CM | POA: Diagnosis not present

## 2016-05-26 DIAGNOSIS — I82A12 Acute embolism and thrombosis of left axillary vein: Secondary | ICD-10-CM | POA: Diagnosis not present

## 2016-05-26 DIAGNOSIS — R7881 Bacteremia: Secondary | ICD-10-CM | POA: Diagnosis not present

## 2016-05-26 DIAGNOSIS — K863 Pseudocyst of pancreas: Secondary | ICD-10-CM | POA: Diagnosis not present

## 2016-05-26 DIAGNOSIS — Z86718 Personal history of other venous thrombosis and embolism: Secondary | ICD-10-CM | POA: Diagnosis not present

## 2016-05-26 DIAGNOSIS — R079 Chest pain, unspecified: Secondary | ICD-10-CM | POA: Diagnosis not present

## 2016-05-26 DIAGNOSIS — I351 Nonrheumatic aortic (valve) insufficiency: Secondary | ICD-10-CM | POA: Diagnosis not present

## 2016-05-26 DIAGNOSIS — N179 Acute kidney failure, unspecified: Secondary | ICD-10-CM | POA: Diagnosis not present

## 2016-05-26 DIAGNOSIS — T80211A Bloodstream infection due to central venous catheter, initial encounter: Secondary | ICD-10-CM | POA: Diagnosis not present

## 2016-05-26 DIAGNOSIS — Z6820 Body mass index (BMI) 20.0-20.9, adult: Secondary | ICD-10-CM | POA: Diagnosis not present

## 2016-05-26 DIAGNOSIS — I480 Paroxysmal atrial fibrillation: Secondary | ICD-10-CM | POA: Diagnosis not present

## 2016-05-26 DIAGNOSIS — E1165 Type 2 diabetes mellitus with hyperglycemia: Secondary | ICD-10-CM | POA: Diagnosis not present

## 2016-05-26 DIAGNOSIS — I82402 Acute embolism and thrombosis of unspecified deep veins of left lower extremity: Secondary | ICD-10-CM | POA: Diagnosis not present

## 2016-05-26 DIAGNOSIS — N400 Enlarged prostate without lower urinary tract symptoms: Secondary | ICD-10-CM | POA: Diagnosis not present

## 2016-05-26 DIAGNOSIS — A4902 Methicillin resistant Staphylococcus aureus infection, unspecified site: Secondary | ICD-10-CM | POA: Diagnosis not present

## 2016-05-26 DIAGNOSIS — R531 Weakness: Secondary | ICD-10-CM | POA: Diagnosis not present

## 2016-05-26 DIAGNOSIS — E878 Other disorders of electrolyte and fluid balance, not elsewhere classified: Secondary | ICD-10-CM | POA: Diagnosis not present

## 2016-05-26 DIAGNOSIS — Z87891 Personal history of nicotine dependence: Secondary | ICD-10-CM | POA: Diagnosis not present

## 2016-05-26 DIAGNOSIS — Z792 Long term (current) use of antibiotics: Secondary | ICD-10-CM | POA: Diagnosis not present

## 2016-05-26 DIAGNOSIS — M109 Gout, unspecified: Secondary | ICD-10-CM | POA: Diagnosis not present

## 2016-05-26 DIAGNOSIS — Z8249 Family history of ischemic heart disease and other diseases of the circulatory system: Secondary | ICD-10-CM | POA: Diagnosis not present

## 2016-05-26 DIAGNOSIS — I82622 Acute embolism and thrombosis of deep veins of left upper extremity: Secondary | ICD-10-CM | POA: Diagnosis not present

## 2016-05-26 DIAGNOSIS — K219 Gastro-esophageal reflux disease without esophagitis: Secondary | ICD-10-CM | POA: Diagnosis not present

## 2016-05-26 DIAGNOSIS — E870 Hyperosmolality and hypernatremia: Secondary | ICD-10-CM | POA: Diagnosis not present

## 2016-05-26 DIAGNOSIS — R64 Cachexia: Secondary | ICD-10-CM | POA: Diagnosis not present

## 2016-05-26 DIAGNOSIS — E860 Dehydration: Secondary | ICD-10-CM | POA: Diagnosis not present

## 2016-05-26 DIAGNOSIS — B957 Other staphylococcus as the cause of diseases classified elsewhere: Secondary | ICD-10-CM | POA: Diagnosis not present

## 2016-05-26 DIAGNOSIS — T82898A Other specified complication of vascular prosthetic devices, implants and grafts, initial encounter: Secondary | ICD-10-CM | POA: Diagnosis not present

## 2016-05-26 DIAGNOSIS — A499 Bacterial infection, unspecified: Secondary | ICD-10-CM | POA: Diagnosis not present

## 2016-05-26 DIAGNOSIS — E43 Unspecified severe protein-calorie malnutrition: Secondary | ICD-10-CM | POA: Diagnosis not present

## 2016-05-26 DIAGNOSIS — I10 Essential (primary) hypertension: Secondary | ICD-10-CM | POA: Diagnosis not present

## 2016-05-26 DIAGNOSIS — I371 Nonrheumatic pulmonary valve insufficiency: Secondary | ICD-10-CM | POA: Diagnosis not present

## 2016-05-26 DIAGNOSIS — D72829 Elevated white blood cell count, unspecified: Secondary | ICD-10-CM | POA: Diagnosis not present

## 2016-05-26 NOTE — Telephone Encounter (Signed)
Per Dr. Warrick Parisian, he would like for patient to start Metformin 500 my one daily x 1 week; if tolerated well, increased to 500 mg twice daily x 1 week; if tolerated well, 1000 mg in the am & 500 mg in the pm x 1 week; if tolerated well, 1000 mg twice daily therafter.  Medication was called in to [facility] and I also spoke with the pharmacist at [facility] and informed her of Dr. Merita Norton recommendation.  She was going to contact patient's wife and inform them of this and also tell them if they had any questions or ran into any problems to contact us.

## 2016-05-28 DIAGNOSIS — K863 Pseudocyst of pancreas: Secondary | ICD-10-CM | POA: Diagnosis not present

## 2016-06-02 ENCOUNTER — Telehealth: Payer: Self-pay | Admitting: Family Medicine

## 2016-06-02 ENCOUNTER — Ambulatory Visit (HOSPITAL_COMMUNITY): Payer: Medicare Other

## 2016-06-06 DIAGNOSIS — I809 Phlebitis and thrombophlebitis of unspecified site: Secondary | ICD-10-CM | POA: Insufficient documentation

## 2016-06-06 DIAGNOSIS — R7881 Bacteremia: Secondary | ICD-10-CM | POA: Insufficient documentation

## 2016-06-06 NOTE — Telephone Encounter (Signed)
Already spoke to them, I informed them that we did not start the TPN and we never managed it but it was managed by surgery and they will call the surgeons.

## 2016-06-08 DIAGNOSIS — K863 Pseudocyst of pancreas: Secondary | ICD-10-CM | POA: Diagnosis not present

## 2016-06-12 ENCOUNTER — Other Ambulatory Visit (HOSPITAL_COMMUNITY)
Admission: RE | Admit: 2016-06-12 | Discharge: 2016-06-12 | Disposition: A | Discharge status: Home / Self Care | Payer: Medicare Other | Source: Skilled Nursing Facility | Attending: Internal Medicine | Admitting: Internal Medicine

## 2016-06-12 DIAGNOSIS — M109 Gout, unspecified: Secondary | ICD-10-CM | POA: Diagnosis not present

## 2016-06-12 DIAGNOSIS — I748 Embolism and thrombosis of other arteries: Secondary | ICD-10-CM | POA: Diagnosis not present

## 2016-06-12 DIAGNOSIS — T80211D Bloodstream infection due to central venous catheter, subsequent encounter: Secondary | ICD-10-CM | POA: Diagnosis not present

## 2016-06-12 DIAGNOSIS — A412 Sepsis due to unspecified staphylococcus: Secondary | ICD-10-CM | POA: Diagnosis not present

## 2016-06-12 DIAGNOSIS — I82A12 Acute embolism and thrombosis of left axillary vein: Secondary | ICD-10-CM | POA: Diagnosis not present

## 2016-06-12 DIAGNOSIS — K859 Acute pancreatitis without necrosis or infection, unspecified: Secondary | ICD-10-CM | POA: Insufficient documentation

## 2016-06-12 DIAGNOSIS — I10 Essential (primary) hypertension: Secondary | ICD-10-CM | POA: Diagnosis not present

## 2016-06-12 DIAGNOSIS — I480 Paroxysmal atrial fibrillation: Secondary | ICD-10-CM | POA: Diagnosis not present

## 2016-06-12 DIAGNOSIS — K863 Pseudocyst of pancreas: Secondary | ICD-10-CM | POA: Diagnosis not present

## 2016-06-12 DIAGNOSIS — K8591 Acute pancreatitis with uninfected necrosis, unspecified: Secondary | ICD-10-CM | POA: Diagnosis not present

## 2016-06-12 DIAGNOSIS — N400 Enlarged prostate without lower urinary tract symptoms: Secondary | ICD-10-CM | POA: Diagnosis not present

## 2016-06-12 DIAGNOSIS — M6281 Muscle weakness (generalized): Secondary | ICD-10-CM | POA: Diagnosis not present

## 2016-06-12 DIAGNOSIS — Z7901 Long term (current) use of anticoagulants: Secondary | ICD-10-CM | POA: Diagnosis not present

## 2016-06-12 DIAGNOSIS — Z959 Presence of cardiac and vascular implant and graft, unspecified: Secondary | ICD-10-CM | POA: Diagnosis not present

## 2016-06-12 LAB — CBC WITH DIFFERENTIAL/PLATELET
Basophils Absolute: 0 10*3/uL (ref 0.0–0.1)
Basophils Relative: 0 %
EOS ABS: 0.1 10*3/uL (ref 0.0–0.7)
EOS PCT: 1 %
HCT: 25.5 % — ABNORMAL LOW (ref 39.0–52.0)
Hemoglobin: 8.3 g/dL — ABNORMAL LOW (ref 13.0–17.0)
LYMPHS ABS: 2 10*3/uL (ref 0.7–4.0)
LYMPHS PCT: 23 %
MCH: 29.3 pg (ref 26.0–34.0)
MCHC: 32.5 g/dL (ref 30.0–36.0)
MCV: 90.1 fL (ref 78.0–100.0)
MONO ABS: 1.2 10*3/uL — AB (ref 0.1–1.0)
MONOS PCT: 14 %
Neutro Abs: 5.5 10*3/uL (ref 1.7–7.7)
Neutrophils Relative %: 62 %
PLATELETS: 323 10*3/uL (ref 150–400)
RBC: 2.83 MIL/uL — ABNORMAL LOW (ref 4.22–5.81)
RDW: 18.6 % — AB (ref 11.5–15.5)
WBC: 8.8 10*3/uL (ref 4.0–10.5)

## 2016-06-12 LAB — CREATININE, SERUM
CREATININE: 1.37 mg/dL — AB (ref 0.61–1.24)
GFR calc Af Amer: 57 mL/min — ABNORMAL LOW (ref >60)
GFR calc non Af Amer: 50 mL/min — ABNORMAL LOW (ref >60)

## 2016-06-12 LAB — PHOSPHORUS: Phosphorus: 2.2 mg/dL — ABNORMAL LOW (ref 2.5–4.6)

## 2016-06-12 LAB — PROTIME-INR
INR: 2.53 — ABNORMAL HIGH (ref 0.00–1.49)
Prothrombin Time: 26.9 seconds — ABNORMAL HIGH (ref 11.6–15.2)

## 2016-06-12 LAB — VANCOMYCIN, TROUGH: VANCOMYCIN TR: 38 ug/mL — AB (ref 15–20)

## 2016-06-12 LAB — BUN: BUN: 17 mg/dL (ref 6–20)

## 2016-06-14 ENCOUNTER — Other Ambulatory Visit (HOSPITAL_COMMUNITY)
Admission: RE | Admit: 2016-06-14 | Discharge: 2016-06-14 | Disposition: A | Discharge status: Home / Self Care | Payer: Medicare Other | Source: Other Acute Inpatient Hospital | Attending: Internal Medicine | Admitting: Internal Medicine

## 2016-06-14 DIAGNOSIS — K859 Acute pancreatitis without necrosis or infection, unspecified: Secondary | ICD-10-CM | POA: Insufficient documentation

## 2016-06-14 DIAGNOSIS — K863 Pseudocyst of pancreas: Secondary | ICD-10-CM | POA: Diagnosis not present

## 2016-06-14 LAB — CREATININE, SERUM
CREATININE: 2.47 mg/dL — AB (ref 0.61–1.24)
GFR calc Af Amer: 28 mL/min — ABNORMAL LOW (ref >60)
GFR, EST NON AFRICAN AMERICAN: 24 mL/min — AB (ref >60)

## 2016-06-14 LAB — BUN: BUN: 26 mg/dL — AB (ref 6–20)

## 2016-06-14 LAB — VANCOMYCIN, TROUGH: Vancomycin Tr: 32 ug/mL (ref 15–20)

## 2016-06-15 DIAGNOSIS — Z87891 Personal history of nicotine dependence: Secondary | ICD-10-CM | POA: Diagnosis not present

## 2016-06-15 DIAGNOSIS — B999 Unspecified infectious disease: Secondary | ICD-10-CM | POA: Diagnosis not present

## 2016-06-15 DIAGNOSIS — R350 Frequency of micturition: Secondary | ICD-10-CM | POA: Diagnosis not present

## 2016-06-15 DIAGNOSIS — T80211A Bloodstream infection due to central venous catheter, initial encounter: Secondary | ICD-10-CM | POA: Diagnosis not present

## 2016-06-15 DIAGNOSIS — M109 Gout, unspecified: Secondary | ICD-10-CM | POA: Diagnosis not present

## 2016-06-15 DIAGNOSIS — J3489 Other specified disorders of nose and nasal sinuses: Secondary | ICD-10-CM | POA: Diagnosis not present

## 2016-06-15 DIAGNOSIS — T80211D Bloodstream infection due to central venous catheter, subsequent encounter: Secondary | ICD-10-CM | POA: Diagnosis not present

## 2016-06-15 DIAGNOSIS — Z8249 Family history of ischemic heart disease and other diseases of the circulatory system: Secondary | ICD-10-CM | POA: Diagnosis not present

## 2016-06-15 DIAGNOSIS — I82602 Acute embolism and thrombosis of unspecified veins of left upper extremity: Secondary | ICD-10-CM | POA: Diagnosis not present

## 2016-06-15 DIAGNOSIS — Z09 Encounter for follow-up examination after completed treatment for conditions other than malignant neoplasm: Secondary | ICD-10-CM | POA: Diagnosis not present

## 2016-06-15 DIAGNOSIS — Z792 Long term (current) use of antibiotics: Secondary | ICD-10-CM | POA: Diagnosis not present

## 2016-06-15 DIAGNOSIS — Z888 Allergy status to other drugs, medicaments and biological substances status: Secondary | ICD-10-CM | POA: Diagnosis not present

## 2016-06-15 DIAGNOSIS — N401 Enlarged prostate with lower urinary tract symptoms: Secondary | ICD-10-CM | POA: Diagnosis not present

## 2016-06-15 DIAGNOSIS — I480 Paroxysmal atrial fibrillation: Secondary | ICD-10-CM | POA: Diagnosis not present

## 2016-06-15 DIAGNOSIS — I10 Essential (primary) hypertension: Secondary | ICD-10-CM | POA: Diagnosis not present

## 2016-06-15 DIAGNOSIS — T368X5D Adverse effect of other systemic antibiotics, subsequent encounter: Secondary | ICD-10-CM | POA: Diagnosis not present

## 2016-06-15 DIAGNOSIS — M7989 Other specified soft tissue disorders: Secondary | ICD-10-CM | POA: Diagnosis not present

## 2016-06-15 DIAGNOSIS — A419 Sepsis, unspecified organism: Secondary | ICD-10-CM | POA: Diagnosis not present

## 2016-06-15 DIAGNOSIS — I808 Phlebitis and thrombophlebitis of other sites: Secondary | ICD-10-CM | POA: Diagnosis not present

## 2016-06-15 DIAGNOSIS — I809 Phlebitis and thrombophlebitis of unspecified site: Secondary | ICD-10-CM | POA: Diagnosis not present

## 2016-06-15 DIAGNOSIS — T368X5A Adverse effect of other systemic antibiotics, initial encounter: Secondary | ICD-10-CM | POA: Diagnosis not present

## 2016-06-15 DIAGNOSIS — Z7901 Long term (current) use of anticoagulants: Secondary | ICD-10-CM | POA: Diagnosis not present

## 2016-06-15 DIAGNOSIS — E7800 Pure hypercholesterolemia, unspecified: Secondary | ICD-10-CM | POA: Diagnosis not present

## 2016-06-15 DIAGNOSIS — R7989 Other specified abnormal findings of blood chemistry: Secondary | ICD-10-CM | POA: Diagnosis not present

## 2016-06-15 DIAGNOSIS — N179 Acute kidney failure, unspecified: Secondary | ICD-10-CM | POA: Diagnosis not present

## 2016-06-16 DIAGNOSIS — N179 Acute kidney failure, unspecified: Secondary | ICD-10-CM | POA: Diagnosis not present

## 2016-06-22 ENCOUNTER — Other Ambulatory Visit (HOSPITAL_COMMUNITY)
Admission: RE | Admit: 2016-06-22 | Discharge: 2016-06-22 | Disposition: A | Discharge status: Home / Self Care | Payer: Medicare Other | Source: Other Acute Inpatient Hospital | Attending: Internal Medicine | Admitting: Internal Medicine

## 2016-06-22 DIAGNOSIS — I700 Atherosclerosis of aorta: Secondary | ICD-10-CM | POA: Diagnosis not present

## 2016-06-22 DIAGNOSIS — N3289 Other specified disorders of bladder: Secondary | ICD-10-CM | POA: Diagnosis not present

## 2016-06-22 DIAGNOSIS — K859 Acute pancreatitis without necrosis or infection, unspecified: Secondary | ICD-10-CM | POA: Insufficient documentation

## 2016-06-22 DIAGNOSIS — D735 Infarction of spleen: Secondary | ICD-10-CM | POA: Diagnosis not present

## 2016-06-22 DIAGNOSIS — M4328 Fusion of spine, sacral and sacrococcygeal region: Secondary | ICD-10-CM | POA: Diagnosis not present

## 2016-06-22 DIAGNOSIS — N2889 Other specified disorders of kidney and ureter: Secondary | ICD-10-CM | POA: Diagnosis not present

## 2016-06-22 DIAGNOSIS — Z95828 Presence of other vascular implants and grafts: Secondary | ICD-10-CM | POA: Diagnosis not present

## 2016-06-22 DIAGNOSIS — K8591 Acute pancreatitis with uninfected necrosis, unspecified: Secondary | ICD-10-CM | POA: Diagnosis not present

## 2016-06-22 DIAGNOSIS — M5135 Other intervertebral disc degeneration, thoracolumbar region: Secondary | ICD-10-CM | POA: Diagnosis not present

## 2016-06-22 DIAGNOSIS — N4289 Other specified disorders of prostate: Secondary | ICD-10-CM | POA: Diagnosis not present

## 2016-06-22 DIAGNOSIS — K449 Diaphragmatic hernia without obstruction or gangrene: Secondary | ICD-10-CM | POA: Diagnosis not present

## 2016-06-22 DIAGNOSIS — K5730 Diverticulosis of large intestine without perforation or abscess without bleeding: Secondary | ICD-10-CM | POA: Diagnosis not present

## 2016-06-22 DIAGNOSIS — I2510 Atherosclerotic heart disease of native coronary artery without angina pectoris: Secondary | ICD-10-CM | POA: Diagnosis not present

## 2016-06-22 DIAGNOSIS — N400 Enlarged prostate without lower urinary tract symptoms: Secondary | ICD-10-CM | POA: Diagnosis not present

## 2016-06-22 DIAGNOSIS — M898X0 Other specified disorders of bone, multiple sites: Secondary | ICD-10-CM | POA: Diagnosis not present

## 2016-06-22 DIAGNOSIS — K8689 Other specified diseases of pancreas: Secondary | ICD-10-CM | POA: Diagnosis not present

## 2016-06-22 DIAGNOSIS — K863 Pseudocyst of pancreas: Secondary | ICD-10-CM | POA: Diagnosis not present

## 2016-06-22 DIAGNOSIS — I708 Atherosclerosis of other arteries: Secondary | ICD-10-CM | POA: Diagnosis not present

## 2016-06-22 DIAGNOSIS — K4090 Unilateral inguinal hernia, without obstruction or gangrene, not specified as recurrent: Secondary | ICD-10-CM | POA: Diagnosis not present

## 2016-06-22 DIAGNOSIS — Z9049 Acquired absence of other specified parts of digestive tract: Secondary | ICD-10-CM | POA: Diagnosis not present

## 2016-06-22 DIAGNOSIS — R938 Abnormal findings on diagnostic imaging of other specified body structures: Secondary | ICD-10-CM | POA: Diagnosis not present

## 2016-06-22 LAB — VANCOMYCIN, TROUGH: Vancomycin Tr: 30 ug/mL (ref 15–20)

## 2016-06-22 LAB — BASIC METABOLIC PANEL
Anion gap: 8 (ref 5–15)
BUN: 27 mg/dL — AB (ref 6–20)
CO2: 24 mmol/L (ref 22–32)
CREATININE: 1.61 mg/dL — AB (ref 0.61–1.24)
Calcium: 8.4 mg/dL — ABNORMAL LOW (ref 8.9–10.3)
Chloride: 104 mmol/L (ref 101–111)
GFR calc Af Amer: 47 mL/min — ABNORMAL LOW (ref >60)
GFR, EST NON AFRICAN AMERICAN: 41 mL/min — AB (ref >60)
GLUCOSE: 100 mg/dL — AB (ref 65–99)
POTASSIUM: 3.7 mmol/L (ref 3.5–5.1)
Sodium: 136 mmol/L (ref 135–145)

## 2016-06-22 LAB — PROTIME-INR
INR: 2.76 — ABNORMAL HIGH (ref 0.00–1.49)
Prothrombin Time: 28.8 seconds — ABNORMAL HIGH (ref 11.6–15.2)

## 2016-06-26 ENCOUNTER — Other Ambulatory Visit (HOSPITAL_COMMUNITY)
Admission: RE | Admit: 2016-06-26 | Discharge: 2016-06-26 | Disposition: A | Discharge status: Home / Self Care | Payer: Medicare Other | Source: Ambulatory Visit | Attending: Internal Medicine | Admitting: Internal Medicine

## 2016-06-26 DIAGNOSIS — K863 Pseudocyst of pancreas: Secondary | ICD-10-CM | POA: Diagnosis not present

## 2016-06-26 DIAGNOSIS — K859 Acute pancreatitis without necrosis or infection, unspecified: Secondary | ICD-10-CM | POA: Diagnosis not present

## 2016-06-26 LAB — PROTIME-INR
INR: 2.79 — ABNORMAL HIGH (ref 0.00–1.49)
PROTHROMBIN TIME: 29 s — AB (ref 11.6–15.2)

## 2016-06-26 LAB — BASIC METABOLIC PANEL
ANION GAP: 6 (ref 5–15)
BUN: 24 mg/dL — ABNORMAL HIGH (ref 6–20)
CALCIUM: 8.3 mg/dL — AB (ref 8.9–10.3)
CO2: 24 mmol/L (ref 22–32)
CREATININE: 1.71 mg/dL — AB (ref 0.61–1.24)
Chloride: 103 mmol/L (ref 101–111)
GFR calc non Af Amer: 38 mL/min — ABNORMAL LOW (ref >60)
GFR, EST AFRICAN AMERICAN: 44 mL/min — AB (ref >60)
Glucose, Bld: 134 mg/dL — ABNORMAL HIGH (ref 65–99)
Potassium: 3.9 mmol/L (ref 3.5–5.1)
Sodium: 133 mmol/L — ABNORMAL LOW (ref 135–145)

## 2016-06-29 ENCOUNTER — Other Ambulatory Visit: Payer: Self-pay | Admitting: Pharmacist

## 2016-06-29 ENCOUNTER — Ambulatory Visit (INDEPENDENT_AMBULATORY_CARE_PROVIDER_SITE_OTHER): Payer: Medicare Other | Admitting: Pharmacist

## 2016-06-29 DIAGNOSIS — Z7901 Long term (current) use of anticoagulants: Secondary | ICD-10-CM

## 2016-06-29 DIAGNOSIS — I4891 Unspecified atrial fibrillation: Secondary | ICD-10-CM

## 2016-06-29 DIAGNOSIS — D6859 Other primary thrombophilia: Secondary | ICD-10-CM | POA: Diagnosis not present

## 2016-06-29 DIAGNOSIS — I824Y9 Acute embolism and thrombosis of unspecified deep veins of unspecified proximal lower extremity: Secondary | ICD-10-CM | POA: Insufficient documentation

## 2016-06-29 DIAGNOSIS — I748 Embolism and thrombosis of other arteries: Secondary | ICD-10-CM | POA: Diagnosis not present

## 2016-06-29 DIAGNOSIS — Z86718 Personal history of other venous thrombosis and embolism: Secondary | ICD-10-CM | POA: Insufficient documentation

## 2016-06-29 LAB — COAGUCHEK XS/INR WAIVED
INR: 2.3 — AB (ref 0.9–1.1)
Prothrombin Time: 27.7 s

## 2016-06-29 NOTE — Patient Instructions (Signed)
Anticoagulation Dose Instructions as of 06/29/2016      Alan [name] Tue Wed Thu Fri Sat   New Dose 5 mg 2.5 mg 5 mg 2.5 mg 5 mg 2.5 mg 5 mg    Description        Continue current warfarin dose of 5mg  tablets - take 1/2 tablet mondays, wednesdays and fridays.  Take 1 tablet all other days.     INR was 2.3 today

## 2016-07-03 ENCOUNTER — Encounter: Payer: Self-pay | Admitting: Family Medicine

## 2016-07-03 ENCOUNTER — Ambulatory Visit (INDEPENDENT_AMBULATORY_CARE_PROVIDER_SITE_OTHER): Payer: Medicare Other | Admitting: Family Medicine

## 2016-07-03 VITALS — BP 126/64 | HR 60 | Temp 97.6°F | Ht 65.0 in | Wt 141.6 lb

## 2016-07-03 DIAGNOSIS — T80219D Unspecified infection due to central venous catheter, subsequent encounter: Secondary | ICD-10-CM

## 2016-07-03 DIAGNOSIS — N179 Acute kidney failure, unspecified: Secondary | ICD-10-CM

## 2016-07-03 DIAGNOSIS — T80219A Unspecified infection due to central venous catheter, initial encounter: Secondary | ICD-10-CM

## 2016-07-03 NOTE — Progress Notes (Signed)
BP 126/64   Pulse 60   Temp 97.6 F (36.4 C) (Oral)   Ht '5\' 5"'  (1.651 m)   Wt 141 lb 9.6 oz (64.2 kg)   BMI 23.56 kg/m    Subjective:    Patient ID: Alan [name]., male    DOB: [birth year], 74 y.o.   MRN: [id number]  HPI: Alan [name]. is a 75 y.o. male presenting on 07/03/2016 for Hospital followup [facility])   [facility] follow-up for vancomycin-induced AKI  Patient comes in today for hospital follow-up. He was admitted to [facility] for a follow-up on his PICC line infection. He had vancomycin troughs that were getting too high and is starting to cause some early acute kidney injury. They admitted him to cover him with other antibiotics and given fluids to get his kidney function back to his baseline. This was being managed by infectious disease. He initially had the infection from a PICC line infection in his left arm. They say he has been treated for that and is not currently on any more antibiotics. He denies any symptoms from it or swelling or pain in the arm or fevers or chills. He is overall feeling a lot better. They have started letting him feed and eat normal food now. He denies any abdominal pain but has been taking it slow and not eating foods that are too greasy. His pancreatitis has been, as far as he knows and he is not having any further issues with it.  Relevant past medical, surgical, family and social history reviewed and updated as indicated. Interim medical history since our last visit reviewed. Allergies and medications reviewed and updated.  Review of Systems  Constitutional: Negative for chills, diaphoresis and fever.  HENT: Negative for ear discharge and ear pain.   Eyes: Negative for discharge and visual disturbance.  Respiratory: Negative for shortness of breath and wheezing.   Cardiovascular: Negative for chest pain and leg swelling.  Gastrointestinal: Negative for abdominal pain, constipation and diarrhea.  Genitourinary: Negative for  difficulty urinating.  Musculoskeletal: Negative for back pain and gait problem.  Skin: Negative for color change, rash and wound.  Neurological: Negative for dizziness, syncope, weakness, light-headedness and headaches.  All other systems reviewed and are negative.   Per HPI unless specifically indicated above     Medication List       Accurate as of 07/03/16  9:28 AM. Always use your most recent med list.          amLODipine 10 MG tablet Commonly known as:  NORVASC Take 1 tablet (10 mg total) by mouth every evening.   febuxostat 40 MG tablet Commonly known as:  ULORIC Take 2 tablets (80 mg total) by mouth daily.   finasteride 5 MG tablet Commonly known as:  PROSCAR Take 5 mg by mouth daily.   fluticasone 50 MCG/ACT nasal spray Commonly known as:  FLONASE Place 1 spray into both nostrils 2 (two) times daily as needed for allergies or rhinitis.   metoprolol succinate 100 MG 24 hr tablet Commonly known as:  TOPROL-XL Take 1 tablet (100 mg total) by mouth 1 day or 1 dose.   niacin 1000 MG CR tablet Commonly known as:  NIASPAN TAKE ONE TABLET BY MOUTH ONCE DAILY AT BEDTIME   pravastatin 40 MG tablet Commonly known as:  PRAVACHOL Take 40 mg by mouth daily.   warfarin 5 MG tablet Commonly known as:  COUMADIN TAKE ONE TABLET BY MOUTH ONCE DAILY AT  6  PM          Objective:    BP 126/64   Pulse 60   Temp 97.6 F (36.4 C) (Oral)   Ht '5\' 5"'  (1.651 m)   Wt 141 lb 9.6 oz (64.2 kg)   BMI 23.56 kg/m   Wt Readings from Last 3 Encounters:  07/03/16 141 lb 9.6 oz (64.2 kg)  05/17/16 128 lb 9.6 oz (58.3 kg)  04/20/16 154 lb 3.2 oz (69.9 kg)    Physical Exam  Constitutional: He is oriented to person, place, and time. He appears well-developed and well-nourished. No distress.  Eyes: Conjunctivae and EOM are normal. Pupils are equal, round, and reactive to light. Right eye exhibits no discharge. No scleral icterus.  Neck: Neck supple. No thyromegaly present.    Cardiovascular: Normal rate, regular rhythm, normal heart sounds and intact distal pulses.   No murmur heard. Pulmonary/Chest: Effort normal and breath sounds normal. No respiratory distress. He has no wheezes.  Abdominal: Soft. Bowel sounds are normal. He exhibits no distension. There is no tenderness. There is no rebound and no guarding.  Musculoskeletal: Normal range of motion. He exhibits no edema.  Lymphadenopathy:    He has no cervical adenopathy.  Neurological: He is alert and oriented to person, place, and time. Coordination normal.  Skin: Skin is warm and dry. No rash noted. He is not diaphoretic. No erythema.  Psychiatric: He has a normal mood and affect. His behavior is normal.  Nursing note and vitals reviewed.      Assessment & Plan:   Problem List Items Addressed This Visit    None    Visit Diagnoses    PICC line infection, subsequent encounter    -  Primary   AKI (acute kidney injury) ([place])       Relevant Orders   CMP14+EGFR       Follow up plan: Return in about 4 weeks (around 07/31/2016), or if symptoms worsen or fail to improve, for htn recheck.  Counseling provided for all of the vaccine components Orders Placed This Encounter  Procedures  . [place] Blayze Haen, MD [facility] 07/03/2016, 9:28 AM

## 2016-07-06 ENCOUNTER — Ambulatory Visit: Payer: Self-pay | Admitting: Family Medicine

## 2016-07-07 ENCOUNTER — Ambulatory Visit (INDEPENDENT_AMBULATORY_CARE_PROVIDER_SITE_OTHER): Payer: Medicare Other | Admitting: Gastroenterology

## 2016-07-07 ENCOUNTER — Encounter: Payer: Self-pay | Admitting: Gastroenterology

## 2016-07-07 VITALS — BP 134/56 | HR 55 | Temp 97.8°F | Ht 65.0 in | Wt 141.8 lb

## 2016-07-07 DIAGNOSIS — K859 Acute pancreatitis without necrosis or infection, unspecified: Secondary | ICD-10-CM | POA: Diagnosis not present
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
## 2016-07-07 NOTE — Patient Instructions (Signed)
I have scheduled you for a special CT of your pancreas.   Make sure that you follow up with Dr. Fuller Plan for your next colonoscopy that is due in September of this year.

## 2016-07-07 NOTE — Assessment & Plan Note (Signed)
Complicated course as noted in HPI, now with need for CT with pancreatic protocol to rule out underlying malignancy. If normal, no further imaging required. Original episode felt to be biliary in origin, with subsequent cholecystectomy performed Dec 2016. As of note, due for surveillance colonoscopy in Sept 2017. He will follow-up with his primary GI, Dr. Fuller Plan, for this.

## 2016-07-10 ENCOUNTER — Ambulatory Visit (INDEPENDENT_AMBULATORY_CARE_PROVIDER_SITE_OTHER): Payer: Medicare Other | Admitting: Family Medicine

## 2016-07-10 DIAGNOSIS — I82A12 Acute embolism and thrombosis of left axillary vein: Secondary | ICD-10-CM | POA: Diagnosis not present

## 2016-07-10 DIAGNOSIS — A412 Sepsis due to unspecified staphylococcus: Secondary | ICD-10-CM | POA: Diagnosis not present

## 2016-07-10 DIAGNOSIS — T80211D Bloodstream infection due to central venous catheter, subsequent encounter: Secondary | ICD-10-CM | POA: Diagnosis not present

## 2016-07-10 DIAGNOSIS — M6281 Muscle weakness (generalized): Secondary | ICD-10-CM

## 2016-07-11 NOTE — Progress Notes (Signed)
cc'ed to pcp °

## 2016-07-12 ENCOUNTER — Ambulatory Visit (HOSPITAL_COMMUNITY)
Admission: RE | Admit: 2016-07-12 | Discharge: 2016-07-12 | Disposition: A | Discharge status: Home / Self Care | Payer: Medicare Other | Source: Ambulatory Visit | Attending: Gastroenterology | Admitting: Gastroenterology

## 2016-07-12 DIAGNOSIS — R634 Abnormal weight loss: Secondary | ICD-10-CM | POA: Diagnosis not present

## 2016-07-12 DIAGNOSIS — K859 Acute pancreatitis without necrosis or infection, unspecified: Secondary | ICD-10-CM | POA: Diagnosis not present

## 2016-07-12 MED ORDER — IOPAMIDOL (ISOVUE-300) INJECTION 61%
75.0000 mL | Freq: Once | INTRAVENOUS | Status: AC | PRN
  Administered 2016-07-12: 75 mL via INTRAVENOUS

## 2016-07-13 ENCOUNTER — Ambulatory Visit (INDEPENDENT_AMBULATORY_CARE_PROVIDER_SITE_OTHER): Payer: Medicare Other | Admitting: Pharmacist

## 2016-07-13 DIAGNOSIS — Z7901 Long term (current) use of anticoagulants: Secondary | ICD-10-CM

## 2016-07-13 DIAGNOSIS — I4891 Unspecified atrial fibrillation: Secondary | ICD-10-CM

## 2016-07-13 DIAGNOSIS — D6859 Other primary thrombophilia: Secondary | ICD-10-CM

## 2016-07-13 DIAGNOSIS — I748 Embolism and thrombosis of other arteries: Secondary | ICD-10-CM

## 2016-07-13 DIAGNOSIS — Z86718 Personal history of other venous thrombosis and embolism: Secondary | ICD-10-CM

## 2016-07-13 LAB — COAGUCHEK XS/INR WAIVED
INR: 3.5 — AB (ref 0.9–1.1)
PROTHROMBIN TIME: 42.3 s

## 2016-07-25 NOTE — Progress Notes (Signed)
Reviewed CT. We need to schedule an MRI/MRCP with pancreatic protocol in 2 weeks to see if there is still an area of concern in the pancreatic head. He may ultimately need an EUS.

## 2016-07-26 ENCOUNTER — Other Ambulatory Visit: Payer: Self-pay

## 2016-07-27 ENCOUNTER — Ambulatory Visit (INDEPENDENT_AMBULATORY_CARE_PROVIDER_SITE_OTHER): Payer: Medicare Other | Admitting: Pharmacist

## 2016-07-27 DIAGNOSIS — Z7901 Long term (current) use of anticoagulants: Secondary | ICD-10-CM | POA: Diagnosis not present

## 2016-07-27 DIAGNOSIS — D6859 Other primary thrombophilia: Secondary | ICD-10-CM | POA: Diagnosis not present

## 2016-07-27 DIAGNOSIS — Z86718 Personal history of other venous thrombosis and embolism: Secondary | ICD-10-CM | POA: Diagnosis not present

## 2016-07-27 DIAGNOSIS — I748 Embolism and thrombosis of other arteries: Secondary | ICD-10-CM | POA: Diagnosis not present

## 2016-07-27 DIAGNOSIS — I4891 Unspecified atrial fibrillation: Secondary | ICD-10-CM | POA: Diagnosis not present

## 2016-07-27 LAB — COAGUCHEK XS/INR WAIVED
INR: 1.7 — ABNORMAL HIGH (ref 0.9–1.1)
PROTHROMBIN TIME: 21 s

## 2016-07-27 NOTE — Progress Notes (Signed)
Please see result note. I am not sure I routed it or not. Sorry! Needs an MRI/MRCP in 2 weeks to ensure there is no area in the pancreatic head that needs further evaluation.

## 2016-07-27 NOTE — Progress Notes (Signed)
Tried to call pt. Phone number disconnected. Mailing a letter to call.

## 2016-07-31 DIAGNOSIS — K863 Pseudocyst of pancreas: Secondary | ICD-10-CM | POA: Diagnosis not present

## 2016-07-31 DIAGNOSIS — K9189 Other postprocedural complications and disorders of digestive system: Secondary | ICD-10-CM | POA: Diagnosis not present

## 2016-07-31 DIAGNOSIS — N179 Acute kidney failure, unspecified: Secondary | ICD-10-CM | POA: Diagnosis not present

## 2016-07-31 DIAGNOSIS — K8591 Acute pancreatitis with uninfected necrosis, unspecified: Secondary | ICD-10-CM | POA: Diagnosis not present

## 2016-07-31 DIAGNOSIS — K8020 Calculus of gallbladder without cholecystitis without obstruction: Secondary | ICD-10-CM | POA: Diagnosis not present

## 2016-08-03 ENCOUNTER — Ambulatory Visit (INDEPENDENT_AMBULATORY_CARE_PROVIDER_SITE_OTHER): Payer: Medicare Other | Admitting: Family Medicine

## 2016-08-03 ENCOUNTER — Encounter: Payer: Self-pay | Admitting: Family Medicine

## 2016-08-03 VITALS — BP 137/48 | HR 57 | Temp 97.7°F | Ht 65.0 in | Wt 161.2 lb

## 2016-08-03 DIAGNOSIS — N179 Acute kidney failure, unspecified: Secondary | ICD-10-CM | POA: Diagnosis not present

## 2016-08-03 DIAGNOSIS — I748 Embolism and thrombosis of other arteries: Secondary | ICD-10-CM

## 2016-08-03 DIAGNOSIS — Z86718 Personal history of other venous thrombosis and embolism: Secondary | ICD-10-CM

## 2016-08-03 DIAGNOSIS — I10 Essential (primary) hypertension: Secondary | ICD-10-CM

## 2016-08-03 DIAGNOSIS — D6859 Other primary thrombophilia: Secondary | ICD-10-CM

## 2016-08-03 DIAGNOSIS — Z7901 Long term (current) use of anticoagulants: Secondary | ICD-10-CM

## 2016-08-03 LAB — COAGUCHEK XS/INR WAIVED
INR: 2.1 — AB (ref 0.9–1.1)
PROTHROMBIN TIME: 25.1 s

## 2016-08-03 NOTE — Progress Notes (Signed)
BP (!) 137/48 (BP Location: Left Arm, Patient Position: Sitting, Cuff Size: Normal)   Pulse (!) 57   Temp 97.7 F (36.5 C) (Oral)   Ht _0  (1.651 m)   Wt 161 lb 3.2 oz (73.1 kg)   BMI 26.83 kg/m    Subjective:    Patient ID: Alan Netters., male    DOB: Apr 23, 1941, 75 y.o.   MRN: [id number]  HPI: Alan [name]. is a 75 y.o. male presenting on 08/03/2016 for Atrial Fibrillation (Protime) and Hypertension (4 wk rck)   HPI Hypertension recheck Patient is coming in today for hypertension recheck and renal function rechecked. His blood pressure today is 137/48. Because of concern for hypotension we are letting him go a little bit higher. He denies any lightheadedness or dizziness or chest pain. Patient is currently on amlodipine and metoprolol. Patient is gaining weight finally after dealing with all of his pancreatitis issues.  Anticoagulation visit for A. fib and history of DVTs. Patient is currently on Coumadin for hypercoagulable state. His INR came back at 2.1. We will continue his current dosing for now and he will follow-up again in 2 weeks with Tammy D. He denies any bleeding episodes  Relevant past medical, surgical, family and social history reviewed and updated as indicated. Interim medical history since our last visit reviewed. Allergies and medications reviewed and updated.  Review of Systems  Constitutional: Negative for chills and fever.  HENT: Negative for ear discharge and ear pain.   Eyes: Negative for discharge and visual disturbance.  Respiratory: Negative for shortness of breath and wheezing.   Cardiovascular: Negative for chest pain and leg swelling.  Gastrointestinal: Negative for abdominal pain, constipation and diarrhea.  Genitourinary: Negative for difficulty urinating.  Musculoskeletal: Negative for back pain and gait problem.  Skin: Negative for rash.  Neurological: Negative for syncope, light-headedness and headaches.  All other systems reviewed and  are negative.   Per HPI unless specifically indicated above     Medication List       Accurate as of 08/03/16  1:38 PM. Always use your most recent med list.          amLODipine 10 MG tablet Commonly known as:  NORVASC Take 1 tablet (10 mg total) by mouth every evening.   febuxostat 40 MG tablet Commonly known as:  ULORIC Take 2 tablets (80 mg total) by mouth daily.   finasteride 5 MG tablet Commonly known as:  PROSCAR Take 5 mg by mouth daily.   fluticasone 50 MCG/ACT nasal spray Commonly known as:  FLONASE Place 1 spray into both nostrils 2 (two) times daily as needed for allergies or rhinitis.   metoprolol succinate 100 MG 24 hr tablet Commonly known as:  TOPROL-XL Take 1 tablet (100 mg total) by mouth 1 day or 1 dose.   niacin 1000 MG CR tablet Commonly known as:  NIASPAN TAKE ONE TABLET BY MOUTH ONCE DAILY AT BEDTIME   pravastatin 40 MG tablet Commonly known as:  PRAVACHOL Take 40 mg by mouth daily.   warfarin 5 MG tablet Commonly known as:  COUMADIN Take 0.5-1 tablets (2.5-5 mg total) by mouth daily at 6 PM. As directed by anticoagulation clinic          Objective:    BP (!) 137/48 (BP Location: Left Arm, Patient Position: Sitting, Cuff Size: Normal)   Pulse (!) 57   Temp 97.7 F (36.5 C) (Oral)   Ht _1  (1.651 m)   Wt 161  lb 3.2 oz (73.1 kg)   BMI 26.83 kg/m   Wt Readings from Last 3 Encounters:  08/03/16 161 lb 3.2 oz (73.1 kg)  07/07/16 141 lb 12.8 oz (64.3 kg)  07/03/16 141 lb 9.6 oz (64.2 kg)    Physical Exam  Constitutional: He is oriented to person, place, and time. He appears well-developed and well-nourished. No distress.  Eyes: Conjunctivae and EOM are normal. Pupils are equal, round, and reactive to light. Right eye exhibits no discharge. No scleral icterus.  Neck: Neck supple. No thyromegaly present.  Cardiovascular: Normal rate, regular rhythm, normal heart sounds and intact distal pulses.   No murmur heard. Pulmonary/Chest:  Effort normal and breath sounds normal. No respiratory distress. He has no wheezes.  Musculoskeletal: Normal range of motion. He exhibits no edema.  Lymphadenopathy:    He has no cervical adenopathy.  Neurological: He is alert and oriented to person, place, and time. Coordination normal.  Skin: Skin is warm and dry. No rash noted. He is not diaphoretic.  Psychiatric: He has a normal mood and affect. His behavior is normal.  Nursing note and vitals reviewed.   INR 2.1  Anticoagulation Dose Instructions as of 08/03/2016      Dorene Grebe Tue Wed Thu Fri Sat   New Dose 5 mg 2.5 mg 5 mg 2.5 mg 5 mg 2.5 mg 5 mg    Description   INR is 2.1, continue current dosing        Assessment & Plan:   Problem List Items Addressed This Visit      Cardiovascular and Mediastinum   Essential hypertension   Relevant Orders   BMP8+EGFR     Hematopoietic and Hemostatic   Primary hypercoagulable state ([place]) [D68.59]     Other   Long term (current) use of anticoagulants [Z79.01] - Primary   History of DVT (deep vein thrombosis)    Other Visit Diagnoses    Embolism and thrombosis of splenic artery       AKI (acute kidney injury) ([place])       Recheck kidneys after multiple hospital visits just to make sure that they are still stable   Relevant Orders   BMP8+EGFR       Follow up plan: Return in about 3 months (around 11/03/2016), or if symptoms worsen or fail to improve, for Recheck hypertension patient also needs a follow-up with Cherre Robins PhD for Coumadin management.  Counseling provided for all of the vaccine components Orders Placed This Encounter  Procedures  . BMP8+EGFR    Caryl Pina, MD [facility] 08/03/2016, 1:38 PM

## 2016-08-03 NOTE — Patient Instructions (Signed)
Anticoagulation Dose Instructions as of 08/03/2016      Alan [name] Tue Wed Thu Fri Sat   New Dose 5 mg 2.5 mg 5 mg 2.5 mg 5 mg 2.5 mg 5 mg    Description   INR is 2.1, continue current dosing

## 2016-08-04 LAB — BMP8+EGFR
BUN / CREAT RATIO: 16 (ref 10–24)
BUN: 14 mg/dL (ref 8–27)
CHLORIDE: 102 mmol/L (ref 96–106)
CO2: 24 mmol/L (ref 18–29)
CREATININE: 0.89 mg/dL (ref 0.76–1.27)
Calcium: 9.9 mg/dL (ref 8.6–10.2)
GFR calc Af Amer: 98 mL/min/{1.73_m2} (ref >59)
GFR calc non Af Amer: 85 mL/min/{1.73_m2} (ref >59)
GLUCOSE: 118 mg/dL — AB (ref 65–99)
Potassium: 4.2 mmol/L (ref 3.5–5.2)
Sodium: 141 mmol/L (ref 134–144)

## 2016-08-07 ENCOUNTER — Telehealth: Payer: Self-pay

## 2016-08-07 NOTE — Telephone Encounter (Signed)
I discussed with patient's wife. She will talk with him and call us back. If we haven't heard from him in the next 48 hours, please call back on his mobile to see what he has decided. Very important we proceed with an MRI/MRCP in next few weeks to exclude occult malignancy.

## 2016-08-07 NOTE — Telephone Encounter (Signed)
Pt received a letter to call. I gave him the information from Laban Emperor, NP that she needs him to do an MRI. He said he just does not understand what is going on. He had a CT at [facility] before he came here and then we ordered another one. And he was just released from [facility] and he would like to discuss with Roseanne Kaufman, NP. He said he was feeling great.

## 2016-08-08 ENCOUNTER — Other Ambulatory Visit: Payer: Self-pay

## 2016-08-08 DIAGNOSIS — R634 Abnormal weight loss: Secondary | ICD-10-CM

## 2016-08-08 NOTE — Telephone Encounter (Signed)
Pt is aware. See other phone note 

## 2016-08-08 NOTE — Telephone Encounter (Signed)
Pt is aware and call was transferred to [place] to schedule.

## 2016-08-15 ENCOUNTER — Ambulatory Visit (HOSPITAL_COMMUNITY): Admission: RE | Admit: 2016-08-15 | Payer: Medicare Other | Source: Ambulatory Visit

## 2016-08-28 ENCOUNTER — Other Ambulatory Visit: Payer: Self-pay | Admitting: Family Medicine

## 2016-08-31 ENCOUNTER — Ambulatory Visit (INDEPENDENT_AMBULATORY_CARE_PROVIDER_SITE_OTHER): Payer: Medicare Other | Admitting: Pharmacist

## 2016-08-31 DIAGNOSIS — Z7901 Long term (current) use of anticoagulants: Secondary | ICD-10-CM

## 2016-08-31 DIAGNOSIS — Z23 Encounter for immunization: Secondary | ICD-10-CM | POA: Diagnosis not present

## 2016-08-31 DIAGNOSIS — Z86718 Personal history of other venous thrombosis and embolism: Secondary | ICD-10-CM

## 2016-08-31 DIAGNOSIS — I748 Embolism and thrombosis of other arteries: Secondary | ICD-10-CM

## 2016-08-31 DIAGNOSIS — D6859 Other primary thrombophilia: Secondary | ICD-10-CM

## 2016-08-31 LAB — COAGUCHEK XS/INR WAIVED
INR: 1.4 — AB (ref 0.9–1.1)
Prothrombin Time: 16.3 s

## 2016-08-31 MED ORDER — OMEPRAZOLE 20 MG PO CPDR: 20.0000 mg | DELAYED_RELEASE_CAPSULE | Freq: Every day | ORAL | 0 refills | Status: DC

## 2016-09-04 ENCOUNTER — Telehealth: Payer: Self-pay | Admitting: Pharmacist

## 2016-09-04 NOTE — Telephone Encounter (Signed)
Patient mentioned at last anticoagulation appt with Dr Jeffie Pollock had wanted to do biopsy and that he would need to hold warfarin prior to biopsy.   I called Dr Ralene Muskrat office about biopsy and plans.  Alan [name] the nurse states that last time Alan [name] saw Dr Jeffie Pollock was 03/217.  Plan was to do biopsy when hernia repair was done do patient would only have to stop warfarin once.   I will check with patient about hernia repair and then get back to [place] to schedule biopsy.

## 2016-09-06 NOTE — Telephone Encounter (Signed)
Patient was in office today with his wife and I spoke with him regarding hernia repair.  He states that this was completed during the summer.  Will contact Dr Ralene Muskrat office about new plan for biopsy.

## 2016-09-14 ENCOUNTER — Ambulatory Visit (INDEPENDENT_AMBULATORY_CARE_PROVIDER_SITE_OTHER): Payer: Medicare Other | Admitting: Pharmacist

## 2016-09-14 ENCOUNTER — Telehealth: Payer: Self-pay | Admitting: Pharmacist

## 2016-09-14 DIAGNOSIS — I748 Embolism and thrombosis of other arteries: Secondary | ICD-10-CM | POA: Diagnosis not present

## 2016-09-14 DIAGNOSIS — Z86718 Personal history of other venous thrombosis and embolism: Secondary | ICD-10-CM | POA: Diagnosis not present

## 2016-09-14 DIAGNOSIS — D6859 Other primary thrombophilia: Secondary | ICD-10-CM

## 2016-09-14 LAB — COAGUCHEK XS/INR WAIVED
INR: 1.2 — AB (ref 0.9–1.1)
PROTHROMBIN TIME: 14.7 s

## 2016-09-14 NOTE — Telephone Encounter (Signed)
Message for Dr Jeffie Pollock and his nurse that Alan [name] need biopsy scheduled. The plan was to do this last Spring when hernia repaired but patient did not schedule both.  Will need to hold warfarin prior to biopsy once scheduled.

## 2016-09-19 ENCOUNTER — Telehealth: Payer: Self-pay | Admitting: Family Medicine

## 2016-09-19 NOTE — Telephone Encounter (Signed)
Dr Jeffie Pollock would like to see patient again and repeat PSA prior to scheduling biopsy.  [facility] will make appt and then let us know about when biopsy is scheduled.  Will hold warfarin at least 5 days prior to biopsy.

## 2016-09-20 ENCOUNTER — Other Ambulatory Visit: Payer: Self-pay | Admitting: Family Medicine

## 2016-09-20 DIAGNOSIS — I10 Essential (primary) hypertension: Secondary | ICD-10-CM

## 2016-09-21 ENCOUNTER — Ambulatory Visit (INDEPENDENT_AMBULATORY_CARE_PROVIDER_SITE_OTHER): Payer: Medicare Other | Admitting: Pharmacist

## 2016-09-21 DIAGNOSIS — D6859 Other primary thrombophilia: Secondary | ICD-10-CM

## 2016-09-21 DIAGNOSIS — I748 Embolism and thrombosis of other arteries: Secondary | ICD-10-CM

## 2016-09-21 DIAGNOSIS — Z86718 Personal history of other venous thrombosis and embolism: Secondary | ICD-10-CM

## 2016-09-21 DIAGNOSIS — Z86010 Personal history of colonic polyps: Secondary | ICD-10-CM

## 2016-09-21 LAB — COAGUCHEK XS/INR WAIVED
INR: 1.5 — ABNORMAL HIGH (ref 0.9–1.1)
Prothrombin Time: 18.1 s

## 2016-10-02 DIAGNOSIS — R972 Elevated prostate specific antigen [PSA]: Secondary | ICD-10-CM | POA: Diagnosis not present

## 2016-10-03 ENCOUNTER — Encounter: Payer: Self-pay | Admitting: Pharmacist

## 2016-10-04 ENCOUNTER — Ambulatory Visit (INDEPENDENT_AMBULATORY_CARE_PROVIDER_SITE_OTHER): Payer: Medicare Other | Admitting: Pharmacist

## 2016-10-04 DIAGNOSIS — Z86718 Personal history of other venous thrombosis and embolism: Secondary | ICD-10-CM

## 2016-10-04 DIAGNOSIS — I748 Embolism and thrombosis of other arteries: Secondary | ICD-10-CM | POA: Diagnosis not present

## 2016-10-04 DIAGNOSIS — D6859 Other primary thrombophilia: Secondary | ICD-10-CM | POA: Diagnosis not present

## 2016-10-04 LAB — COAGUCHEK XS/INR WAIVED
INR: 1.5 — ABNORMAL HIGH (ref 0.9–1.1)
Prothrombin Time: 17.7 s

## 2016-10-06 ENCOUNTER — Ambulatory Visit (INDEPENDENT_AMBULATORY_CARE_PROVIDER_SITE_OTHER): Payer: Medicare Other | Admitting: Urology

## 2016-10-06 DIAGNOSIS — N401 Enlarged prostate with lower urinary tract symptoms: Secondary | ICD-10-CM

## 2016-10-06 DIAGNOSIS — R3912 Poor urinary stream: Secondary | ICD-10-CM

## 2016-10-06 DIAGNOSIS — C61 Malignant neoplasm of prostate: Secondary | ICD-10-CM | POA: Diagnosis not present

## 2016-10-06 DIAGNOSIS — N5201 Erectile dysfunction due to arterial insufficiency: Secondary | ICD-10-CM

## 2016-10-06 DIAGNOSIS — R808 Other proteinuria: Secondary | ICD-10-CM

## 2016-10-18 ENCOUNTER — Ambulatory Visit (INDEPENDENT_AMBULATORY_CARE_PROVIDER_SITE_OTHER): Payer: Medicare Other | Admitting: Pharmacist

## 2016-10-18 DIAGNOSIS — I748 Embolism and thrombosis of other arteries: Secondary | ICD-10-CM | POA: Diagnosis not present

## 2016-10-18 DIAGNOSIS — Z86718 Personal history of other venous thrombosis and embolism: Secondary | ICD-10-CM | POA: Diagnosis not present

## 2016-10-18 DIAGNOSIS — D6859 Other primary thrombophilia: Secondary | ICD-10-CM | POA: Diagnosis not present

## 2016-10-18 LAB — COAGUCHEK XS/INR WAIVED
INR: 1.6 — ABNORMAL HIGH (ref 0.9–1.1)
Prothrombin Time: 19.5 s

## 2016-10-28 ENCOUNTER — Other Ambulatory Visit: Payer: Self-pay | Admitting: Family Medicine

## 2016-11-01 ENCOUNTER — Encounter: Payer: Self-pay | Admitting: Pharmacist

## 2016-11-03 ENCOUNTER — Encounter: Payer: Self-pay | Admitting: Family Medicine

## 2016-11-03 ENCOUNTER — Ambulatory Visit (INDEPENDENT_AMBULATORY_CARE_PROVIDER_SITE_OTHER): Payer: Medicare Other | Admitting: Family Medicine

## 2016-11-03 VITALS — BP 143/64 | HR 63 | Temp 97.3°F | Ht 65.0 in | Wt 174.8 lb

## 2016-11-03 DIAGNOSIS — Z86718 Personal history of other venous thrombosis and embolism: Secondary | ICD-10-CM | POA: Diagnosis not present

## 2016-11-03 DIAGNOSIS — I10 Essential (primary) hypertension: Secondary | ICD-10-CM

## 2016-11-03 DIAGNOSIS — E119 Type 2 diabetes mellitus without complications: Secondary | ICD-10-CM | POA: Diagnosis not present

## 2016-11-03 DIAGNOSIS — K219 Gastro-esophageal reflux disease without esophagitis: Secondary | ICD-10-CM | POA: Diagnosis not present

## 2016-11-03 DIAGNOSIS — E782 Mixed hyperlipidemia: Secondary | ICD-10-CM | POA: Diagnosis not present

## 2016-11-03 DIAGNOSIS — I748 Embolism and thrombosis of other arteries: Secondary | ICD-10-CM

## 2016-11-03 DIAGNOSIS — D6859 Other primary thrombophilia: Secondary | ICD-10-CM

## 2016-11-03 LAB — COAGUCHEK XS/INR WAIVED
INR: 2.5 — ABNORMAL HIGH (ref 0.9–1.1)
Prothrombin Time: 30.5 s

## 2016-11-03 LAB — BAYER DCA HB A1C WAIVED: HB A1C: 6.5 % (ref <7.0)

## 2016-11-03 LAB — POCT INR: INR: 2.5

## 2016-11-03 NOTE — Patient Instructions (Signed)
Anticoagulation Dose Instructions as of 11/03/2016      Alan [name] Tue Wed Thu Fri Sat   New Dose 5 mg 7.5 mg 7.5 mg 10 mg 5 mg 7.5 mg 7.5 mg   Alt Week 5 mg 7.5 mg 7.5 mg 7.5 mg 5 mg 7.5 mg 7.5 mg    Description    5mg  tablets - take 1 tablet on sundays and thursdays only.  Take 1 and 1/2 tablet all the rest of the day.  INR was 2.5 today  Return in one month to see Cherre Robins PhD

## 2016-11-03 NOTE — Progress Notes (Signed)
BP (!) 143/64   Pulse 63   Temp 97.3 F (36.3 C) (Oral)   Ht '5\' 5"'  (1.651 m)   Wt 174 lb 12.8 oz (79.3 kg)   BMI 29.09 kg/m    Subjective:    Patient ID: [name], male    DOB: 06-29-1941, 75 y.o.   MRN: [id number]  HPI: [place] Carreno. is a 75 y.o. male presenting on 11/03/2016 for Gastroesophageal Reflux (3 month recheck); Hypertension; Hyperlipidemia; and Coagulation Disorder   HPI Hypertension And hyperlipidemia recheck Patient is coming in for blood pressure recheck. Blood pressure today is 143/64. This is an acceptable blood pressure for this patient's age category. The patient is currently on metoprolol and amlodipine. Patient denies headaches, blurred vision, chest pains, shortness of breath, or weakness. Denies any side effects from medication and is content with current medication. He denies any myalgias or joint pains.  Type 2 diabetes Patient is coming in for recheck on diabetes. His diabetes has been under control at 5.9 couple years ago. Through all of his illnesses and being in the hospital he had not had one rechecked recently. He has seen an ophthalmologist in February 2017.Patient is not on an ACE inhibitor because of all of his issues with acute kidney injuries and hospitalizations recently, we are letting him to improve from that and we'll readdress it in the future. He denies any new issues with his feet  GERD recheck Patient is coming in for recheck on his GERD. He is currently on omeprazole 20 mg daily and says he is doing very well on it. He denies any blood in his stool or abdominal pains or heartburn.  DVT history and thrombosis of splenic artery and Coumadin recheck Patient is coming in for a Coumadin recheck. He is currently on 5 mg 2 days a week and 2.5 mg 5 days a week. He is due for recheck today. He denies any abnormal bruising or bleeding.  Relevant past medical, surgical, family and social history reviewed and updated as indicated. Interim  medical history since our last visit reviewed. Allergies and medications reviewed and updated.  Review of Systems  Constitutional: Negative for chills and fever.  Eyes: Negative for discharge.  Respiratory: Negative for shortness of breath and wheezing.   Cardiovascular: Negative for chest pain and leg swelling.  Gastrointestinal: Negative for abdominal pain, blood in stool, constipation, diarrhea, nausea and vomiting.  Musculoskeletal: Negative for back pain and gait problem.  Skin: Negative for color change and rash.  Neurological: Negative for dizziness, weakness, light-headedness, numbness and headaches.  Hematological: Does not bruise/bleed easily.  All other systems reviewed and are negative.   Per HPI unless specifically indicated above     Objective:    BP (!) 143/64   Pulse 63   Temp 97.3 F (36.3 C) (Oral)   Ht '5\' 5"'  (1.651 m)   Wt 174 lb 12.8 oz (79.3 kg)   BMI 29.09 kg/m   Wt Readings from Last 3 Encounters:  11/03/16 174 lb 12.8 oz (79.3 kg)  08/03/16 161 lb 3.2 oz (73.1 kg)  07/07/16 141 lb 12.8 oz (64.3 kg)    Physical Exam  Constitutional: He is oriented to person, place, and time. He appears well-developed and well-nourished. No distress.  Eyes: Conjunctivae are normal. Right eye exhibits no discharge. No scleral icterus.  Cardiovascular: Normal rate, regular rhythm, normal heart sounds and intact distal pulses.   No murmur heard. Pulmonary/Chest: Effort normal and breath sounds normal. No respiratory  distress. He has no wheezes. He has no rales.  Abdominal: Soft. Bowel sounds are normal. He exhibits no distension. There is no tenderness. There is no rebound.  Musculoskeletal: Normal range of motion. He exhibits no edema.  Neurological: He is alert and oriented to person, place, and time. Coordination normal.  Skin: Skin is warm and dry. No rash noted. He is not diaphoretic.  Psychiatric: He has a normal mood and affect. His behavior is normal.  Nursing  note and vitals reviewed.   Results for orders placed or performed in visit on 11/03/16  POCT INR  Result Value Ref Range   INR 2.5    Anticoagulation Dose Instructions as of 11/03/2016      Dorene Grebe Tue Wed Thu Fri Sat   New Dose 5 mg 7.5 mg 7.5 mg 10 mg 5 mg 7.5 mg 7.5 mg   Alt Week 5 mg 7.5 mg 7.5 mg 7.5 mg 5 mg 7.5 mg 7.5 mg    Description    25m tablets - take 1 tablet on sundays and thursdays only.  Take 1 and 1/2 tablet all the rest of the day.  INR was 2.5 today  Return in one month to see TCherre RobinsPhD        Assessment & Plan:   Problem List Items Addressed This Visit      Cardiovascular and Mediastinum   Essential hypertension - Primary   Relevant Orders   CMP14+EGFR (Completed)   CBC with Differential/Platelet (Completed)     Digestive   GERD (gastroesophageal reflux disease)   Relevant Orders   CBC with Differential/Platelet (Completed)     Endocrine   Type 2 diabetes mellitus without complications ([place]   Relevant Orders   Bayer DCA Hb A1c Waived (Completed)     Hematopoietic and Hemostatic   Primary hypercoagulable state ([place] [D68.59]     Other   HLD (hyperlipidemia)   Relevant Orders   Lipid panel (Completed)   History of DVT (deep vein thrombosis)    Other Visit Diagnoses    Embolism and thrombosis of splenic artery           Follow up plan: Return in about 4 weeks (around 12/01/2016), or if symptoms worsen or fail to improve, for Return in 4 weeks and see Tammy, and 3 months to see me.  Counseling provided for all of the vaccine components Orders Placed This Encounter  Procedures  . CMP14+EGFR  . Lipid panel  . Bayer DCA Hb A1c Waived  . CBC with Differential/Platelet  . POCT INR    JCaryl Pina MD [facility] 11/03/2016, 2:42 PM

## 2016-11-04 LAB — CMP14+EGFR
ALBUMIN: 4 g/dL (ref 3.5–4.8)
ALK PHOS: 66 IU/L (ref 39–117)
ALT: 21 IU/L (ref 0–44)
AST: 22 IU/L (ref 0–40)
Albumin/Globulin Ratio: 1.3 (ref 1.2–2.2)
BUN/Creatinine Ratio: 17 (ref 10–24)
BUN: 20 mg/dL (ref 8–27)
Bilirubin Total: 0.7 mg/dL (ref 0.0–1.2)
CALCIUM: 9.1 mg/dL (ref 8.6–10.2)
CO2: 23 mmol/L (ref 18–29)
CREATININE: 1.18 mg/dL (ref 0.76–1.27)
Chloride: 101 mmol/L (ref 96–106)
GFR calc Af Amer: 70 mL/min/{1.73_m2} (ref >59)
GFR, EST NON AFRICAN AMERICAN: 61 mL/min/{1.73_m2} (ref >59)
GLOBULIN, TOTAL: 3.2 g/dL (ref 1.5–4.5)
GLUCOSE: 128 mg/dL — AB (ref 65–99)
Potassium: 4 mmol/L (ref 3.5–5.2)
SODIUM: 140 mmol/L (ref 134–144)
Total Protein: 7.2 g/dL (ref 6.0–8.5)

## 2016-11-04 LAB — CBC WITH DIFFERENTIAL/PLATELET
BASOS ABS: 0 10*3/uL (ref 0.0–0.2)
BASOS: 0 %
EOS (ABSOLUTE): 0.1 10*3/uL (ref 0.0–0.4)
Eos: 2 %
Hematocrit: 33.4 % — ABNORMAL LOW (ref 37.5–51.0)
Hemoglobin: 11.1 g/dL — ABNORMAL LOW (ref 12.6–17.7)
Immature Grans (Abs): 0 10*3/uL (ref 0.0–0.1)
Immature Granulocytes: 0 %
LYMPHS ABS: 2.2 10*3/uL (ref 0.7–3.1)
LYMPHS: 33 %
MCH: 29.1 pg (ref 26.6–33.0)
MCHC: 33.2 g/dL (ref 31.5–35.7)
MCV: 87 fL (ref 79–97)
MONOS ABS: 0.7 10*3/uL (ref 0.1–0.9)
Monocytes: 11 %
NEUTROS ABS: 3.7 10*3/uL (ref 1.4–7.0)
Neutrophils: 54 %
PLATELETS: 275 10*3/uL (ref 150–379)
RBC: 3.82 x10E6/uL — ABNORMAL LOW (ref 4.14–5.80)
RDW: 14.2 % (ref 12.3–15.4)
WBC: 6.7 10*3/uL (ref 3.4–10.8)

## 2016-11-04 LAB — LIPID PANEL
CHOL/HDL RATIO: 4.4 ratio (ref 0.0–5.0)
CHOLESTEROL TOTAL: 118 mg/dL (ref 100–199)
HDL: 27 mg/dL — ABNORMAL LOW (ref >39)
LDL CALC: 40 mg/dL (ref 0–99)
TRIGLYCERIDES: 255 mg/dL — AB (ref 0–149)
VLDL CHOLESTEROL CAL: 51 mg/dL — AB (ref 5–40)

## 2016-11-10 ENCOUNTER — Telehealth: Payer: Self-pay | Admitting: Family Medicine

## 2016-11-10 NOTE — Telephone Encounter (Signed)
Aware. 

## 2016-11-10 NOTE — Telephone Encounter (Signed)
Patient advised that Dr. Warrick Parisian recommends 4g of fish oil daily,

## 2016-11-24 ENCOUNTER — Other Ambulatory Visit: Payer: Self-pay | Admitting: Family Medicine

## 2016-11-30 ENCOUNTER — Ambulatory Visit (INDEPENDENT_AMBULATORY_CARE_PROVIDER_SITE_OTHER): Payer: Medicare Other | Admitting: Pharmacist

## 2016-11-30 DIAGNOSIS — D6859 Other primary thrombophilia: Secondary | ICD-10-CM | POA: Diagnosis not present

## 2016-11-30 DIAGNOSIS — Z86718 Personal history of other venous thrombosis and embolism: Secondary | ICD-10-CM

## 2016-11-30 LAB — COAGUCHEK XS/INR WAIVED
INR: 2.4 — ABNORMAL HIGH (ref 0.9–1.1)
Prothrombin Time: 28.5 s

## 2016-11-30 MED ORDER — GLUCOSE BLOOD VI STRP: ORAL_STRIP | 4 refills | Status: DC

## 2016-12-06 ENCOUNTER — Other Ambulatory Visit: Payer: Self-pay | Admitting: Family Medicine

## 2016-12-18 ENCOUNTER — Other Ambulatory Visit: Payer: Self-pay | Admitting: Family Medicine

## 2016-12-22 ENCOUNTER — Other Ambulatory Visit: Payer: Self-pay | Admitting: Family Medicine

## 2016-12-22 DIAGNOSIS — I10 Essential (primary) hypertension: Secondary | ICD-10-CM

## 2017-01-03 ENCOUNTER — Encounter: Payer: Self-pay | Admitting: Pharmacist

## 2017-01-03 ENCOUNTER — Ambulatory Visit (INDEPENDENT_AMBULATORY_CARE_PROVIDER_SITE_OTHER): Payer: Medicare Other | Admitting: Pharmacist

## 2017-01-03 VITALS — BP 144/70 | HR 52 | Ht 65.0 in | Wt 178.0 lb

## 2017-01-03 DIAGNOSIS — Z1211 Encounter for screening for malignant neoplasm of colon: Secondary | ICD-10-CM

## 2017-01-03 DIAGNOSIS — D6859 Other primary thrombophilia: Secondary | ICD-10-CM

## 2017-01-03 DIAGNOSIS — Z0000 Encounter for general adult medical examination without abnormal findings: Secondary | ICD-10-CM

## 2017-01-03 DIAGNOSIS — E119 Type 2 diabetes mellitus without complications: Secondary | ICD-10-CM

## 2017-01-03 DIAGNOSIS — Z86718 Personal history of other venous thrombosis and embolism: Secondary | ICD-10-CM

## 2017-01-03 LAB — COAGUCHEK XS/INR WAIVED
INR: 2.2 — ABNORMAL HIGH (ref 0.9–1.1)
PROTHROMBIN TIME: 26.5 s

## 2017-01-03 MED ORDER — WARFARIN SODIUM 5 MG PO TABS: 5.0000 mg | ORAL_TABLET | Freq: Every day | ORAL | 2 refills | Status: DC

## 2017-01-03 NOTE — Progress Notes (Signed)
Subjective:   Alan [name]. is a 76 y.o. male who presents for a subsequent Medicare Annual Wellness Visit.  Social History: Born/Raised: [place], [place] Occupational history: retired 2010 from [employer] fro [employer] Marital history: Married 2 living children 2 children deceased Alcohol/Tobacco/Substances: no  Patient denies any health concerns today   Current Medications (verified) Outpatient Encounter Prescriptions as of 01/03/2017  Medication Sig  . amLODipine (NORVASC) 10 MG tablet Take 1 tablet (10 mg total) by mouth every evening.  . finasteride (PROSCAR) 5 MG tablet Take 5 mg by mouth daily.  Marland Kitchen glucose blood (ONE TOUCH ULTRA TEST) test strip Use to check BG once daily.  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 metoprolol succinate (TOPROL-XL) 100 MG 24 hr tablet TAKE ONE TABLET BY MOUTH ONCE DAILY  . niacin (NIASPAN) 1000 MG CR tablet TAKE ONE TABLET BY MOUTH ONCE DAILY AT BEDTIME  . Omega-3 Fatty Acids (FISH OIL) 1000 MG CAPS Take 2 capsules by mouth 2 (two) times daily.  Marland Kitchen omeprazole (PRILOSEC) 20 MG capsule TAKE ONE CAPSULE BY MOUTH ONCE DAILY  . pravastatin (PRAVACHOL) 40 MG tablet TAKE ONE TABLET BY MOUTH ONCE DAILY IN THE EVENING  . ULORIC 40 MG tablet TAKE TWO TABLETS BY MOUTH ONCE DAILY  . warfarin (COUMADIN) 5 MG tablet TAKE ONE TABLET BY MOUTH ONCE DAILY AT  6  PM  . [DISCONTINUED] warfarin (COUMADIN) 5 MG tablet Take 0.5-1 tablets (2.5-5 mg total) by mouth daily at 6 PM. As directed by anticoagulation clinic  . fluticasone (FLONASE) 50 MCG/ACT nasal spray Place 1 spray into both nostrils 2 (two) times daily as needed for allergies or rhinitis. (Patient not taking: Reported on 01/03/2017)   No facility-administered encounter medications on file as of 01/03/2017.     Allergies (verified) Allopurinol and Colchicine   History: Past Medical History:  Diagnosis Date  . Adenomatous colon polyp   . Agranulocytosis ([place])   . Allergy    . Atrial fibrillation ([place]) 04/19/2016  . Cataract    small  . Diabetes mellitus   . Diverticulosis   . ED (erectile dysfunction)   . 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 Gallstones   . GERD (gastroesophageal reflux disease)   . Gout   . Heart murmur   . Hyperlipidemia   . Hypertension   . Internal hemorrhoids   . Pancreatitis   . 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  . Prostate cancer ([place])    Followed by Dr. Jeffie Pollock    Past Surgical History:  Procedure Laterality Date  . CHOLECYSTECTOMY N/A 11/26/2015   Procedure: LAPAROSCOPIC CHOLECYSTECTOMY;  Surgeon: Aviva Signs, MD;  Location: [facility];  Service: General;  Laterality: N/A;  . COLONOSCOPY  2012   [name]: moderate sigmoid diverticulosis, internal hemorrhoids. next tcs 2017  . FLEXIBLE BRONCHOSCOPY Bilateral 04/14/2016   Procedure: FLEXIBLE BRONCHOSCOPY;  Surgeon: Sinda Du, MD;  Location: [facility];  Service: Cardiopulmonary;  Laterality: Bilateral;  . SIGMOIDOSCOPY    . UMBILICAL HERNIA REPAIR N/A 11/26/2015   Procedure: UMBILICAL HERNIORRHAPHY;  Surgeon: Aviva Signs, MD;  Location: [facility];  Service: General;  Laterality: N/A;   Family History  Problem Relation Age of Onset  . Stroke Mother   . Hypertension Mother   . Alzheimer's disease Mother   . Hypertension Father   . Cancer Sister     brain  . Cancer Sister     breast  .  Hypertension Sister   . Deep vein thrombosis Brother   . Hypertension Brother   . Colon cancer Neg Hx   . Pancreatic disease Neg Hx    Social History   Occupational History  . Not on file.   Social History Main Topics  . Smoking status: Former Smoker    Types: Cigarettes    Quit date: 12/11/1986  . Smokeless tobacco: Never Used     Comment: Quit x 50 years  . Alcohol use No  . Drug use: No  . Sexual activity: Yes    Do you feel safe at home?  Yes Are there smokers in your home (other than you)? No  Dietary issues and  exercise activities discussed: Current Exercise Habits: The patient does not participate in regular exercise at present  Current Dietary habits:  Tries to limit bread and other foods high in CHOs.  He limits soda intake and only drinks diet soda   Cardiac Risk Factors include: advanced age (>19men, >44 women);diabetes mellitus;dyslipidemia;hypertension;male gender;sedentary lifestyle  Objective:    Today's Vitals   01/03/17 1033 01/03/17 1054  BP: (!) 150/70 (!) 144/70  Pulse: (!) 52   Weight: 178 lb (80.7 kg)   Height: 5\' 5"  (1.651 m)   PainSc: 0-No pain    Body mass index is 29.62 kg/m.   INR was 2.2 today   Activities of Daily Living In your present state of health, do you have any difficulty performing the following activities: 01/03/2017 04/09/2016  Hearing? N -  Vision? N -  Difficulty concentrating or making decisions? N -  Walking or climbing stairs? N -  Dressing or bathing? N -  Doing errands, shopping? N N  Preparing Food and eating ? N -  Using the Toilet? N -  In the past six months, have you accidently leaked urine? N -  Do you have problems with loss of bowel control? N -  Managing your Medications? N -  Managing your Finances? N -  Housekeeping or managing your Housekeeping? N -  Some recent data might be hidden     Depression Screen PHQ 2/9 Scores 01/03/2017 11/03/2016 08/03/2016 07/03/2016  PHQ - 2 Score 0 0 0 0     Fall Risk Fall Risk  01/03/2017 11/03/2016 08/03/2016 07/03/2016 05/17/2016  Falls in the past year? No No No No No  Number falls in past yr: - - - - -  Injury with Fall? - - - - -  Risk for fall due to : - - - - -  Risk for fall due to (comments): - - - - -    Cognitive Function: MMSE - Mini Mental State Exam 12/16/2015 12/16/2015  Orientation to time 5 5  Orientation to Place 5 5  Registration 3 3  Attention/ Calculation 4 4  Recall 3 3  Language- name 2 objects 2 2  Language- repeat 1 1  Language- follow 3 step command 3 3    Language- read & follow direction 1 -  Write a sentence 1 -  Copy design 1 -  Total score 29 -    Immunizations and Health Maintenance Immunization History  Administered Date(s) Administered  . Influenza,inj,Quad PF,36+ Mos 10/02/2013, 11/12/2014, 09/08/2015, 08/31/2016  . Pneumococcal Conjugate-13 03/08/2015   Health Maintenance Due  Topic Date Due  . COLONOSCOPY  08/23/2016  . FOOT EXAM  09/07/2016    Patient Care Team: Worthy Rancher, MD as PCP - General (Family Medicine) Daneil Dolin, MD as  Consulting Physician (Gastroenterology) Irine Seal, MD as Attending Physician (Urology)  Indicate any recent Medical Services you may have received from other than [facility] providers in the past year (date may be approximate).    Assessment:    Annual Wellness Visit  Therapeutic anticoagulation   Screening Tests Health Maintenance  Topic Date Due  . COLONOSCOPY  08/23/2016  . FOOT EXAM  09/07/2016  . ZOSTAVAX  01/12/2017 (Originally 01/20/2003)  . OPHTHALMOLOGY EXAM  02/01/2017  . HEMOGLOBIN A1C  05/03/2017  . URINE MICROALBUMIN  01/03/2018  . TETANUS/TDAP  12/11/2022  . INFLUENZA VACCINE  Completed  . PNA vac Low Risk Adult  Completed        Plan:   During the course of the visit Alan [name] was educated and counseled about the following appropriate screening and preventive services:   Vaccines to include Pneumoccal, Influenza,  Td, Zostavax - due to get shingles vaccine - patient to wait until Shingri available  Colorectal cancer screening - per last report from Dr Fuller Plan recommended recheck in 5 years with would have been 04/2016 (last was 2012)  Cardiovascular disease screening - EKG and ECHO are UTD (2017)  BP is elevated today but HR low  Diabetes - controlled  Glaucoma screening / Diabetic Eye Exam - UTD  Nutrition counseling - continue to limit high CHO foods  Advanced Directives - UTD  Physical Activity - discussed joining silver sneakers program at  [facility]  delinced audiologist referral   Anticoagulation Dose Instructions as of 01/03/2017      Sun Mon Tue Wed Thu Fri Sat   New Dose 5 mg 7.5 mg 7.5 mg 7.5 mg 5 mg 7.5 mg 7.5 mg    Description   Continue current warfarin 5mg  tablet dose- take 1 tablet on sundays and thursdays only.  Take 1 and 1/2 tablet all the rest of the day.  INR was 2.2 today     Patient Instructions (the written plan) were given to the patient.   Cherre Robins, PharmD   01/03/2017

## 2017-01-03 NOTE — Patient Instructions (Addendum)
Alan [name] , Thank you for taking time to come for your Medicare Wellness Visit. I appreciate your ongoing commitment to your health goals. Please review the following plan we discussed and let me know if I can assist you in the future.   These are the goals we discussed:  When you reach coverage gap in 2018 - make sure to contact our office. We might be able to help get you assistance to get some medications during that time.  Consider Silver Sneakers program.  Goal is to get 150 minutes of physical activity each week.  But start where you can - 10 to 15 minutes a day and work up as able.  Continue to limit bread, potato and rice / pasta serving sizes  Increase non-starchy vegetables - carrots, green bean, squash, zucchini, tomatoes, onions, peppers, spinach and other green leafy vegetables, cabbage, lettuce, cucumbers, asparagus, okra (not fried), eggplant Limit sugar and processed foods (cakes, cookies, ice cream, crackers and chips) Increase fresh fruit but limit serving sizes 1/2 cup or about the size of tennis or baseball Limit red meat to no more than 1-2 times per week (serving size about the size of your palm) Choose whole grains / lean proteins - whole wheat bread, quinoa, whole grain rice (1/2 cup), fish, chicken, [place] Avoid sugar and calorie containing beverages - soda, sweet tea and juice.  Choose water or unsweetened tea instead.   This is a list of the screening recommended for you and due dates:  Health Maintenance  Topic Date Due  . Shingles Vaccine  01/20/2003 - will get late in year - cost is $45  . Colon Cancer Screening  08/23/2016  . Complete foot exam   09/07/2016  . Urine Protein Check  01/03/2018 - done today  . Eye exam for diabetics  02/01/2017  . Hemoglobin A1C  05/03/2017  . Tetanus Vaccine  12/11/2022  . Flu Shot  Completed  . Pneumonia vaccines  Completed   DASH Eating Plan DASH stands for "Dietary Approaches to Stop Hypertension." The DASH eating  plan is a healthy eating plan that has been shown to reduce high blood pressure (hypertension). Additional health benefits may include reducing the risk of type 2 diabetes mellitus, heart disease, and stroke. The DASH eating plan may also help with weight loss. What do I need to know about the DASH eating plan? For the DASH eating plan, you will follow these general guidelines:  Choose foods with less than 150 milligrams of sodium per serving (as listed on the food label).  Use salt-free seasonings or herbs instead of table salt or sea salt.  Check with your health care provider or pharmacist before using salt substitutes.  Eat lower-sodium products. These are often labeled as "low-sodium" or "no salt added."  Eat fresh foods. Avoid eating a lot of canned foods.  Eat more vegetables, fruits, and low-fat dairy products.  Choose whole grains. Look for the word "whole" as the first word in the ingredient list.  Choose fish and skinless chicken or [place] more often than red meat. Limit fish, poultry, and meat to 6 oz (170 g) each day.  Limit sweets, desserts, sugars, and sugary drinks.  Choose heart-healthy fats.  Eat more home-cooked food and less restaurant, buffet, and fast food.  Limit fried foods.  Do not fry foods. Cook foods using methods such as baking, boiling, grilling, and broiling instead.  When eating at a restaurant, ask that your food be prepared with less salt, or  no salt if possible. What foods can I eat? Seek help from a dietitian for individual calorie needs. Grains  Whole grain or whole wheat bread. Brown rice. Whole grain or whole wheat pasta. Quinoa, bulgur, and whole grain cereals. Low-sodium cereals. Corn or whole wheat flour tortillas. Whole grain cornbread. Whole grain crackers. Low-sodium crackers. Vegetables  Fresh or frozen vegetables (raw, steamed, roasted, or grilled). Low-sodium or reduced-sodium tomato and vegetable juices. Low-sodium or reduced-sodium  tomato sauce and paste. Low-sodium or reduced-sodium canned vegetables. Fruits  All fresh, canned (in natural juice), or frozen fruits. Meat and Other Protein Products  Ground beef (85% or leaner), grass-fed beef, or beef trimmed of fat. Skinless chicken or [place]. Ground chicken or [place]. Pork trimmed of fat. All fish and seafood. Eggs. Dried beans, peas, or lentils. Unsalted nuts and seeds. Unsalted canned beans. Dairy  Low-fat dairy products, such as skim or 1% milk, 2% or reduced-fat cheeses, low-fat ricotta or cottage cheese, or plain low-fat yogurt. Low-sodium or reduced-sodium cheeses. Fats and Oils  Tub margarines without trans fats. Light or reduced-fat mayonnaise and salad dressings (reduced sodium). Avocado. Safflower, olive, or canola oils. Natural peanut or almond butter. Other  Unsalted popcorn and pretzels. The items listed above may not be a complete list of recommended foods or beverages. Contact your dietitian for more options.  What foods are not recommended? Grains  White bread. White pasta. White rice. Refined cornbread. Bagels and croissants. Crackers that contain trans fat. Vegetables  Creamed or fried vegetables. Vegetables in a cheese sauce. Regular canned vegetables. Regular canned tomato sauce and paste. Regular tomato and vegetable juices. Fruits  Canned fruit in light or heavy syrup. Fruit juice. Meat and Other Protein Products  Fatty cuts of meat. Ribs, chicken wings, bacon, sausage, bologna, salami, chitterlings, fatback, hot dogs, bratwurst, and packaged luncheon meats. Salted nuts and seeds. Canned beans with salt. Dairy  Whole or 2% milk, cream, half-and-half, and cream cheese. Whole-fat or sweetened yogurt. Full-fat cheeses or blue cheese. Nondairy creamers and whipped toppings. Processed cheese, cheese spreads, or cheese curds. Condiments  Onion and garlic salt, seasoned salt, table salt, and sea salt. Canned and packaged gravies. Worcestershire sauce.  Tartar sauce. Barbecue sauce. Teriyaki sauce. Soy sauce, including reduced sodium. Steak sauce. Fish sauce. Oyster sauce. Cocktail sauce. Horseradish. Ketchup and mustard. Meat flavorings and tenderizers. Bouillon cubes. Hot sauce. Tabasco sauce. Marinades. Taco seasonings. Relishes. Fats and Oils  Butter, stick margarine, lard, shortening, ghee, and bacon fat. Coconut, palm kernel, or palm oils. Regular salad dressings. Other  Pickles and olives. Salted popcorn and pretzels. The items listed above may not be a complete list of foods and beverages to avoid. Contact your dietitian for more information.  Where can I find more information? National Heart, Lung, and Blood Institute: travelstabloid.com This information is not intended to replace advice given to you by your health care provider. Make sure you discuss any questions you have with your health care provider. Document Released: 11/16/2011 Document Revised: 05/04/2016 Document Reviewed: 10/01/2013 Elsevier Interactive Patient Education  2017 [employer].

## 2017-01-04 LAB — MICROALBUMIN / CREATININE URINE RATIO
Creatinine, Urine: 112 mg/dL
MICROALBUM., U, RANDOM: 297.5 ug/mL
Microalb/Creat Ratio: 265.6 mg/g creat — ABNORMAL HIGH (ref 0.0–30.0)

## 2017-01-05 ENCOUNTER — Other Ambulatory Visit: Payer: Self-pay | Admitting: Pharmacist

## 2017-01-05 DIAGNOSIS — I10 Essential (primary) hypertension: Secondary | ICD-10-CM

## 2017-01-05 MED ORDER — LISINOPRIL 5 MG PO TABS: 5.0000 mg | ORAL_TABLET | Freq: Every day | ORAL | 0 refills | Status: DC

## 2017-01-05 MED ORDER — METOPROLOL SUCCINATE ER 50 MG PO TB24: 50.0000 mg | ORAL_TABLET | Freq: Every day | ORAL | 1 refills | Status: DC

## 2017-01-24 ENCOUNTER — Other Ambulatory Visit: Payer: Self-pay | Admitting: *Deleted

## 2017-01-24 MED ORDER — AMLODIPINE BESYLATE 10 MG PO TABS: 10.0000 mg | ORAL_TABLET | Freq: Every evening | ORAL | 2 refills | Status: DC

## 2017-01-29 ENCOUNTER — Other Ambulatory Visit: Payer: Self-pay | Admitting: Family Medicine

## 2017-01-30 ENCOUNTER — Other Ambulatory Visit: Payer: Self-pay | Admitting: Family Medicine

## 2017-02-01 ENCOUNTER — Other Ambulatory Visit: Payer: Medicare Other

## 2017-02-01 DIAGNOSIS — Z1211 Encounter for screening for malignant neoplasm of colon: Secondary | ICD-10-CM | POA: Diagnosis not present

## 2017-02-03 LAB — FECAL OCCULT BLOOD, IMMUNOCHEMICAL: Fecal Occult Bld: NEGATIVE

## 2017-02-05 ENCOUNTER — Other Ambulatory Visit: Payer: Self-pay | Admitting: *Deleted

## 2017-02-05 ENCOUNTER — Encounter: Payer: Self-pay | Admitting: Family Medicine

## 2017-02-05 ENCOUNTER — Ambulatory Visit (INDEPENDENT_AMBULATORY_CARE_PROVIDER_SITE_OTHER): Payer: Medicare Other | Admitting: Family Medicine

## 2017-02-05 VITALS — BP 177/76 | HR 54 | Temp 97.8°F | Ht 65.0 in | Wt 182.5 lb

## 2017-02-05 DIAGNOSIS — D6859 Other primary thrombophilia: Secondary | ICD-10-CM

## 2017-02-05 DIAGNOSIS — E119 Type 2 diabetes mellitus without complications: Secondary | ICD-10-CM

## 2017-02-05 DIAGNOSIS — Z86718 Personal history of other venous thrombosis and embolism: Secondary | ICD-10-CM

## 2017-02-05 DIAGNOSIS — I10 Essential (primary) hypertension: Secondary | ICD-10-CM | POA: Diagnosis not present

## 2017-02-05 LAB — COAGUCHEK XS/INR WAIVED
INR: 3.1 — AB (ref 0.9–1.1)
Prothrombin Time: 36.8 s

## 2017-02-05 LAB — BAYER DCA HB A1C WAIVED: HB A1C: 7.1 % — AB (ref <7.0)

## 2017-02-05 LAB — POCT INR: INR: 3.1

## 2017-02-05 MED ORDER — LISINOPRIL 20 MG PO TABS: 20.0000 mg | ORAL_TABLET | Freq: Every day | ORAL | 2 refills | Status: DC

## 2017-02-05 NOTE — Patient Instructions (Signed)
Anticoagulation Dose Instructions as of 02/05/2017      Alan [name] Tue Wed Thu Fri Sat   New Dose 5 mg 7.5 mg 5 mg 7.5 mg 7.5 mg 5 mg 7.5 mg    Description   warfarin 5mg  tablet dose- take 1 tablet on sundays, Tuesdays and Fridays only.  Take 1 and 1/2 tablet all the rest of the day.  INR was 3.1 today

## 2017-02-05 NOTE — Progress Notes (Signed)
BP (!) 177/76   Pulse (!) 54   Temp 97.8 F (36.6 C) (Oral)   Ht _0  (1.651 m)   Wt 182 lb 8 oz (82.8 kg)   BMI 30.37 kg/m    Subjective:    Patient ID: Alan [name]., male    DOB: 01-08-1941, 76 y.o.   MRN: [id number]  HPI: Cordelle Dahmen. is a 76 y.o. male presenting on 02/05/2017 for Diabetes (followup, patient has eaten 2 crackers this morning); Hyperlipidemia; and Hypertension   HPI Hypertension checkup Patient is coming in today for hypertension recheck. He is currently on lisinopril and metoprolol and amlodipine. He had been on a lower dose of the lisinopril previously. His blood pressure has started to creep up now that he is nutrition is improving. His blood pressure today is 177/76. Patient denies headaches, blurred vision, chest pains, shortness of breath, or weakness. Denies any side effects from medication and is content with current medication.   Type 2 diabetes recheck Currently we're monitoring his diabetes as he has been mostly diet controlled and has not needed any medication for it. He was diagnosed with diabetes as last year when he was going through TPN treatments. He has not seen an ophthalmologist this year. He denies any issues with his feet or his vision. He is currently on an ACE inhibitor.  Patient is also coming in for Coumadin recheck today. He is on Coumadin because of previous DVT and hypercoagulable state  Relevant past medical, surgical, family and social history reviewed and updated as indicated. Interim medical history since our last visit reviewed. Allergies and medications reviewed and updated.  Review of Systems  Constitutional: Negative for chills and fever.  Respiratory: Negative for shortness of breath and wheezing.   Cardiovascular: Negative for chest pain and leg swelling.  Gastrointestinal: Negative for blood in stool.  Musculoskeletal: Negative for back pain and gait problem.  Skin: Negative for rash.  Neurological: Negative for  dizziness, weakness, light-headedness, numbness and headaches.  All other systems reviewed and are negative.   Per HPI unless specifically indicated above     Objective:    BP (!) 177/76   Pulse (!) 54   Temp 97.8 F (36.6 C) (Oral)   Ht _1  (1.651 m)   Wt 182 lb 8 oz (82.8 kg)   BMI 30.37 kg/m   Wt Readings from Last 3 Encounters:  02/05/17 182 lb 8 oz (82.8 kg)  01/03/17 178 lb (80.7 kg)  11/03/16 174 lb 12.8 oz (79.3 kg)    Physical Exam  Constitutional: He is oriented to person, place, and time. He appears well-developed and well-nourished. No distress.  Eyes: Conjunctivae are normal. No scleral icterus.  Cardiovascular: Normal rate, regular rhythm, normal heart sounds and intact distal pulses.   No murmur heard. Pulmonary/Chest: Effort normal and breath sounds normal. No respiratory distress. He has no wheezes.  Abdominal: Soft. Bowel sounds are normal. He exhibits no distension. There is no tenderness. There is no rebound.  Musculoskeletal: Normal range of motion. He exhibits no edema.  Neurological: He is alert and oriented to person, place, and time. Coordination normal.  Skin: Skin is warm and dry. No rash noted. He is not diaphoretic.  Psychiatric: He has a normal mood and affect. His behavior is normal.  Nursing note and vitals reviewed.   Results for orders placed or performed in visit on 02/05/17  POCT INR  Result Value Ref Range   INR 3.1    Anticoagulation  Dose Instructions as of 02/05/2017      Dorene Grebe Tue Wed Thu Fri Sat   New Dose 5 mg 7.5 mg 5 mg 7.5 mg 7.5 mg 5 mg 7.5 mg    Description   warfarin 39m tablet dose- take 1 tablet on sundays, Tuesdays and Fridays only.  Take 1 and 1/2 tablet all the rest of the day.  INR was 3.1 today        Assessment & Plan:   Problem List Items Addressed This Visit      Cardiovascular and Mediastinum   Essential hypertension - Primary    Increased his blood pressure pill to 20 mg, we'll see how that does  for him.      Relevant Medications   lisinopril (PRINIVIL,ZESTRIL) 20 MG tablet   Other Relevant Orders   CMP14+EGFR (Completed)     Endocrine   Type 2 diabetes mellitus without complications (HCC)   Relevant Medications   lisinopril (PRINIVIL,ZESTRIL) 20 MG tablet   Other Relevant Orders   Bayer DCA Hb A1c Waived (Completed)   CMP14+EGFR (Completed)     Hematopoietic and Hemostatic   Primary hypercoagulable state ([place] [D68.59]     Other   History of DVT (deep vein thrombosis)     Decreased his Coumadin dose by one half pill a week.  Follow up plan: Return in about 3 months (around 05/05/2017), or if symptoms worsen or fail to improve.  Counseling provided for all of the vaccine components Orders Placed This Encounter  Procedures  . POCT INR    JCaryl Pina MD [facility] 02/05/2017, 10:11 AM

## 2017-02-05 NOTE — Assessment & Plan Note (Signed)
Increased his blood pressure pill to 20 mg, we'll see how that does for him.

## 2017-02-06 LAB — CMP14+EGFR
A/G RATIO: 1.3 (ref 1.2–2.2)
ALBUMIN: 4.3 g/dL (ref 3.5–4.8)
ALT: 20 IU/L (ref 0–44)
AST: 20 IU/L (ref 0–40)
Alkaline Phosphatase: 75 IU/L (ref 39–117)
BILIRUBIN TOTAL: 0.6 mg/dL (ref 0.0–1.2)
BUN / CREAT RATIO: 14 (ref 10–24)
BUN: 15 mg/dL (ref 8–27)
CALCIUM: 9.7 mg/dL (ref 8.6–10.2)
CHLORIDE: 102 mmol/L (ref 96–106)
CO2: 22 mmol/L (ref 18–29)
Creatinine, Ser: 1.06 mg/dL (ref 0.76–1.27)
GFR calc Af Amer: 80 mL/min/{1.73_m2} (ref >59)
GFR, EST NON AFRICAN AMERICAN: 69 mL/min/{1.73_m2} (ref >59)
GLOBULIN, TOTAL: 3.3 g/dL (ref 1.5–4.5)
Glucose: 167 mg/dL — ABNORMAL HIGH (ref 65–99)
POTASSIUM: 4.2 mmol/L (ref 3.5–5.2)
SODIUM: 141 mmol/L (ref 134–144)
TOTAL PROTEIN: 7.6 g/dL (ref 6.0–8.5)

## 2017-02-07 ENCOUNTER — Other Ambulatory Visit: Payer: Self-pay | Admitting: Family Medicine

## 2017-03-05 ENCOUNTER — Ambulatory Visit (INDEPENDENT_AMBULATORY_CARE_PROVIDER_SITE_OTHER): Payer: Medicare Other | Admitting: Pharmacist

## 2017-03-05 DIAGNOSIS — D6859 Other primary thrombophilia: Secondary | ICD-10-CM | POA: Diagnosis not present

## 2017-03-05 DIAGNOSIS — Z86718 Personal history of other venous thrombosis and embolism: Secondary | ICD-10-CM

## 2017-03-05 LAB — COAGUCHEK XS/INR WAIVED
INR: 2.6 — AB (ref 0.9–1.1)
Prothrombin Time: 31.7 s

## 2017-03-08 ENCOUNTER — Other Ambulatory Visit: Payer: Self-pay | Admitting: Family Medicine

## 2017-04-10 ENCOUNTER — Encounter: Payer: Self-pay | Admitting: Pharmacist

## 2017-04-11 ENCOUNTER — Encounter: Payer: Self-pay | Admitting: Family Medicine

## 2017-04-17 ENCOUNTER — Encounter: Payer: Self-pay | Admitting: Pharmacist

## 2017-04-17 ENCOUNTER — Ambulatory Visit (INDEPENDENT_AMBULATORY_CARE_PROVIDER_SITE_OTHER): Payer: Medicare Other | Admitting: Pharmacist

## 2017-04-17 DIAGNOSIS — Z86718 Personal history of other venous thrombosis and embolism: Secondary | ICD-10-CM

## 2017-04-17 DIAGNOSIS — Z7901 Long term (current) use of anticoagulants: Secondary | ICD-10-CM

## 2017-04-17 DIAGNOSIS — D6859 Other primary thrombophilia: Secondary | ICD-10-CM

## 2017-04-17 LAB — COAGUCHEK XS/INR WAIVED
INR: 1.9 — AB (ref 0.9–1.1)
PROTHROMBIN TIME: 22.6 s

## 2017-04-24 DIAGNOSIS — C61 Malignant neoplasm of prostate: Secondary | ICD-10-CM | POA: Diagnosis not present

## 2017-04-27 ENCOUNTER — Ambulatory Visit (INDEPENDENT_AMBULATORY_CARE_PROVIDER_SITE_OTHER): Payer: Medicare Other | Admitting: Urology

## 2017-04-27 DIAGNOSIS — R351 Nocturia: Secondary | ICD-10-CM

## 2017-04-27 DIAGNOSIS — C61 Malignant neoplasm of prostate: Secondary | ICD-10-CM | POA: Diagnosis not present

## 2017-04-27 DIAGNOSIS — N401 Enlarged prostate with lower urinary tract symptoms: Secondary | ICD-10-CM | POA: Diagnosis not present

## 2017-04-27 DIAGNOSIS — N5201 Erectile dysfunction due to arterial insufficiency: Secondary | ICD-10-CM | POA: Diagnosis not present

## 2017-05-08 ENCOUNTER — Ambulatory Visit (INDEPENDENT_AMBULATORY_CARE_PROVIDER_SITE_OTHER): Payer: Medicare Other | Admitting: Family Medicine

## 2017-05-08 ENCOUNTER — Encounter: Payer: Self-pay | Admitting: Family Medicine

## 2017-05-08 VITALS — BP 137/72 | HR 76 | Temp 98.4°F | Ht 65.0 in | Wt 178.0 lb

## 2017-05-08 DIAGNOSIS — E782 Mixed hyperlipidemia: Secondary | ICD-10-CM

## 2017-05-08 DIAGNOSIS — I10 Essential (primary) hypertension: Secondary | ICD-10-CM

## 2017-05-08 DIAGNOSIS — E119 Type 2 diabetes mellitus without complications: Secondary | ICD-10-CM | POA: Diagnosis not present

## 2017-05-08 DIAGNOSIS — I358 Other nonrheumatic aortic valve disorders: Secondary | ICD-10-CM

## 2017-05-08 LAB — BAYER DCA HB A1C WAIVED: HB A1C (BAYER DCA - WAIVED): 6.9 % (ref <7.0)

## 2017-05-08 MED ORDER — PRAVASTATIN SODIUM 40 MG PO TABS: 40.0000 mg | ORAL_TABLET | Freq: Every evening | ORAL | 1 refills | Status: DC

## 2017-05-08 NOTE — Progress Notes (Signed)
BP 137/72   Pulse 76   Temp 98.4 F (36.9 C) (Oral)   Ht '5\' 5"'  (1.651 m)   Wt 178 lb (80.7 kg)   BMI 29.62 kg/m    Subjective:    Patient ID: Alan [name]., male    DOB: 02/23/1941, 76 y.o.   MRN: [id number]  HPI: Alan [name]. is a 76 y.o. male presenting on 05/08/2017 for Hypertension (3 month followup; patient is fasting); Diabetes; and Hyperlipidemia   HPI Type 2 diabetes mellitus Patient comes in today for recheck of his diabetes. Patient has been currently taking Metformin, his blood sugars have been creeping up on him still. Patient is currently on an ACE inhibitor. Patient has not seen an ophthalmologist this year. Patient denies any issues with his feet.   Hyperlipidemia Patient is coming in for recheck of his hyperlipidemia. He is currently taking omega-3 and pravastatin. He denies any issues with myalgias or history of liver damage from it. He denies any focal numbness or weakness or chest pain.   Hypertension Patient is currently on lisinopril and metoprolol and amlodipine, and her blood pressure today is 137/72. Patient denies any lightheadedness or dizziness. Patient denies headaches, blurred vision, chest pains, shortness of breath, or weakness. Denies any side effects from medication and is content with current medication.   Systolic heart murmur Patient has a known systolic heart murmur but he is also has some congestion in the lower part of the lungs, we will redo an echo, to been one year since he had it. He had severe LVH with conserved systolic function and grade 1 diastolic dysfunction at that time.  Relevant past medical, surgical, family and social history reviewed and updated as indicated. Interim medical history since our last visit reviewed. Allergies and medications reviewed and updated.  Review of Systems  Constitutional: Negative for chills and fever.  Eyes: Negative for visual disturbance.  Respiratory: Negative for cough, shortness of breath and  wheezing.   Cardiovascular: Negative for chest pain, palpitations and leg swelling.  Gastrointestinal: Negative for abdominal pain, blood in stool, constipation and diarrhea.  Musculoskeletal: Negative for back pain, gait problem and myalgias.  Skin: Negative for rash.  Neurological: Negative for dizziness, weakness, light-headedness and headaches.  All other systems reviewed and are negative.   Per HPI unless specifically indicated above        Objective:    BP 137/72   Pulse 76   Temp 98.4 F (36.9 C) (Oral)   Ht '5\' 5"'  (1.651 m)   Wt 178 lb (80.7 kg)   BMI 29.62 kg/m   Wt Readings from Last 3 Encounters:  05/08/17 178 lb (80.7 kg)  02/05/17 182 lb 8 oz (82.8 kg)  01/03/17 178 lb (80.7 kg)    Physical Exam  Constitutional: He is oriented to person, place, and time. He appears well-developed and well-nourished. No distress.  Eyes: Conjunctivae are normal. No scleral icterus.  Neck: Neck supple. No thyromegaly present.  Cardiovascular: Normal rate, regular rhythm and intact distal pulses.   Murmur (Grade 3/6 systolic crescendo/decrescendo murmur) heard. Pulmonary/Chest: Effort normal. No respiratory distress. He has no wheezes. He has rales (By basilar crackles).  Musculoskeletal: Normal range of motion. He exhibits edema (1+ edema in right lower leg, improved since being on treatment for thromboses).  Lymphadenopathy:    He has no cervical adenopathy.  Neurological: He is alert and oriented to person, place, and time. Coordination normal.  Skin: Skin is warm and dry. No rash  noted. He is not diaphoretic.  Psychiatric: He has a normal mood and affect. His behavior is normal.  Nursing note and vitals reviewed.     Assessment & Plan:   Problem List Items Addressed This Visit      Cardiovascular and Mediastinum   Essential hypertension - Primary   Relevant Medications   pravastatin (PRAVACHOL) 40 MG tablet   Other Relevant Orders   CMP14+EGFR     Endocrine   Type  2 diabetes mellitus without complications (HCC)   Relevant Medications   pravastatin (PRAVACHOL) 40 MG tablet   Other Relevant Orders   Bayer DCA Hb A1c Waived   CMP14+EGFR     Other   HLD (hyperlipidemia)   Relevant Medications   pravastatin (PRAVACHOL) 40 MG tablet   Other Relevant Orders   Lipid panel    Other Visit Diagnoses    Systolic murmur of aorta       Relevant Orders   ECHOCARDIOGRAM COMPLETE       Follow up plan: Return in about 3 months (around 08/08/2017), or if symptoms worsen or fail to improve, for Recheck diabetes, patient needs routine follow-up with [facility] for Coumadin management.  Counseling provided for all of the vaccine components Orders Placed This Encounter  Procedures  . Bayer DCA Hb A1c Waived  . CMP14+EGFR  . Lipid panel  . ECHOCARDIOGRAM COMPLETE    Caryl Pina, MD [facility] 05/08/2017, 11:34 AM

## 2017-05-09 LAB — LIPID PANEL
CHOL/HDL RATIO: 3.1 ratio (ref 0.0–5.0)
Cholesterol, Total: 85 mg/dL — ABNORMAL LOW (ref 100–199)
HDL: 27 mg/dL — AB (ref >39)
LDL Calculated: 27 mg/dL (ref 0–99)
Triglycerides: 157 mg/dL — ABNORMAL HIGH (ref 0–149)
VLDL Cholesterol Cal: 31 mg/dL (ref 5–40)

## 2017-05-09 LAB — CMP14+EGFR
ALT: 24 IU/L (ref 0–44)
AST: 24 IU/L (ref 0–40)
Albumin/Globulin Ratio: 1.2 (ref 1.2–2.2)
Albumin: 3.8 g/dL (ref 3.5–4.8)
Alkaline Phosphatase: 78 IU/L (ref 39–117)
BUN/Creatinine Ratio: 12 (ref 10–24)
BUN: 10 mg/dL (ref 8–27)
Bilirubin Total: 0.5 mg/dL (ref 0.0–1.2)
CALCIUM: 9.8 mg/dL (ref 8.6–10.2)
CO2: 21 mmol/L (ref 18–29)
Chloride: 102 mmol/L (ref 96–106)
Creatinine, Ser: 0.82 mg/dL (ref 0.76–1.27)
GFR calc non Af Amer: 87 mL/min/{1.73_m2} (ref >59)
GFR, EST AFRICAN AMERICAN: 101 mL/min/{1.73_m2} (ref >59)
Globulin, Total: 3.3 g/dL (ref 1.5–4.5)
Glucose: 116 mg/dL — ABNORMAL HIGH (ref 65–99)
Potassium: 3.9 mmol/L (ref 3.5–5.2)
Sodium: 140 mmol/L (ref 134–144)
TOTAL PROTEIN: 7.1 g/dL (ref 6.0–8.5)

## 2017-05-15 ENCOUNTER — Other Ambulatory Visit: Payer: Self-pay | Admitting: Family Medicine

## 2017-05-16 ENCOUNTER — Ambulatory Visit (HOSPITAL_COMMUNITY)
Admission: RE | Admit: 2017-05-16 | Discharge: 2017-05-16 | Disposition: A | Discharge status: Home / Self Care | Payer: Medicare Other | Source: Ambulatory Visit | Attending: Family Medicine | Admitting: Family Medicine

## 2017-05-16 DIAGNOSIS — E785 Hyperlipidemia, unspecified: Secondary | ICD-10-CM | POA: Insufficient documentation

## 2017-05-16 DIAGNOSIS — I10 Essential (primary) hypertension: Secondary | ICD-10-CM | POA: Insufficient documentation

## 2017-05-16 DIAGNOSIS — I4891 Unspecified atrial fibrillation: Secondary | ICD-10-CM | POA: Insufficient documentation

## 2017-05-16 DIAGNOSIS — I351 Nonrheumatic aortic (valve) insufficiency: Secondary | ICD-10-CM | POA: Insufficient documentation

## 2017-05-16 DIAGNOSIS — I358 Other nonrheumatic aortic valve disorders: Secondary | ICD-10-CM | POA: Diagnosis not present

## 2017-05-16 DIAGNOSIS — R011 Cardiac murmur, unspecified: Secondary | ICD-10-CM | POA: Diagnosis present

## 2017-05-16 DIAGNOSIS — Z8546 Personal history of malignant neoplasm of prostate: Secondary | ICD-10-CM | POA: Insufficient documentation

## 2017-05-16 DIAGNOSIS — E119 Type 2 diabetes mellitus without complications: Secondary | ICD-10-CM | POA: Insufficient documentation

## 2017-05-16 DIAGNOSIS — Z87891 Personal history of nicotine dependence: Secondary | ICD-10-CM | POA: Insufficient documentation

## 2017-05-16 NOTE — Progress Notes (Signed)
*  PRELIMINARY RESULTS* Echocardiogram 2D Echocardiogram has been performed.  Leavy Cella 05/16/2017, 10:18 AM

## 2017-05-18 ENCOUNTER — Other Ambulatory Visit: Payer: Self-pay

## 2017-05-18 DIAGNOSIS — I517 Cardiomegaly: Secondary | ICD-10-CM

## 2017-05-18 NOTE — Progress Notes (Unsigned)
amb  

## 2017-05-30 ENCOUNTER — Other Ambulatory Visit: Payer: Self-pay | Admitting: Family Medicine

## 2017-06-04 ENCOUNTER — Telehealth: Payer: Self-pay | Admitting: Family Medicine

## 2017-06-04 NOTE — Telephone Encounter (Signed)
Just tell them to give him the niacin ER 1000 mg tablet that's generic, it should not cost him more than 30 or $50.

## 2017-06-05 MED ORDER — NIACIN ER (ANTIHYPERLIPIDEMIC) 1000 MG PO TBCR: 1000.0000 mg | EXTENDED_RELEASE_TABLET | Freq: Every day | ORAL | 0 refills | Status: DC

## 2017-06-05 NOTE — Telephone Encounter (Signed)
Patient aware Niacin ER 1000 sent to pharmacy

## 2017-06-08 ENCOUNTER — Ambulatory Visit (INDEPENDENT_AMBULATORY_CARE_PROVIDER_SITE_OTHER): Payer: Medicare Other | Admitting: Nurse Practitioner

## 2017-06-08 ENCOUNTER — Encounter: Payer: Self-pay | Admitting: Nurse Practitioner

## 2017-06-08 VITALS — BP 163/63 | HR 58 | Temp 97.1°F | Ht 65.0 in | Wt 180.0 lb

## 2017-06-08 DIAGNOSIS — S30861A Insect bite (nonvenomous) of abdominal wall, initial encounter: Secondary | ICD-10-CM

## 2017-06-08 DIAGNOSIS — W57XXXA Bitten or stung by nonvenomous insect and other nonvenomous arthropods, initial encounter: Secondary | ICD-10-CM | POA: Diagnosis not present

## 2017-06-08 NOTE — Patient Instructions (Signed)
Tick Bite Information Introduction Ticks are insects that attach themselves to the skin. There are many types of ticks. Common types include wood ticks and deer ticks. Sometimes, ticks carry diseases that can make a person very ill. The most common places for ticks to attach themselves are the scalp, neck, armpits, waist, and groin. HOW CAN YOU PREVENT TICK BITES? Take these steps to help prevent tick bites when you are outdoors:  Wear long sleeves and long pants.  Wear white clothes so you can see ticks more easily.  Tuck your pant legs into your socks.  If walking on a trail, stay in the middle of the trail to avoid brushing against bushes.  Avoid walking through areas with long grass.  Put bug spray on all skin that is showing and along boot tops, pant legs, and sleeve cuffs.  Check clothes, hair, and skin often and before going inside.  Brush off any ticks that are not attached.  Take a shower or bath as soon as possible after being outdoors.  HOW SHOULD YOU REMOVE A TICK? Ticks should be removed as soon as possible to help prevent diseases. 1. If latex gloves are available, put them on before trying to remove a tick. 2. Use tweezers to grasp the tick as close to the skin as possible. You may also use curved forceps or a tick removal tool. Grasp the tick as close to its head as possible. Avoid grasping the tick on its body. 3. Pull gently upward until the tick lets go. Do not twist the tick or jerk it suddenly. This may break off the tick's head or mouth parts. 4. Do not squeeze or crush the tick's body. This could force disease-carrying fluids from the tick into your body. 5. After the tick is removed, wash the bite area and your hands with soap and water or alcohol. 6. Apply a small amount of antiseptic cream or ointment to the bite site. 7. Wash any tools that were used.  Do not try to remove a tick by applying a hot match, petroleum jelly, or fingernail polish to the tick.  These methods do not work. They may also increase the chances of disease being spread from the tick bite. WHEN SHOULD YOU SEEK HELP? Contact your health care provider if you are unable to remove a tick or if a part of the tick breaks off in the skin. After a tick bite, you need to watch for signs and symptoms of diseases that can be spread by ticks. Contact your health care provider if you develop any of the following:  Fever.  Rash.  Redness and puffiness (swelling) in the area of the tick bite.  Tender, puffy lymph glands.  Watery poop (diarrhea).  Weight loss.  Cough.  Feeling more tired than normal (fatigue).  Muscle, joint, or bone pain.  Belly (abdominal) pain.  Headache.  Change in your level of consciousness.  Trouble walking or moving your legs.  Loss of feeling (numbness) in the legs.  Loss of movement (paralysis).  Shortness of breath.  Confusion.  Throwing up (vomiting) many times.  This information is not intended to replace advice given to you by your health care provider. Make sure you discuss any questions you have with your health care provider. Document Released: 02/21/2010 Document Revised: 05/04/2016 Document Reviewed: 05/07/2013 Elsevier Interactive Patient Education  2018 Elsevier Inc.  

## 2017-06-08 NOTE — Progress Notes (Signed)
   Subjective:    Patient ID: Alan Netters., male    DOB: 02-20-1941, 76 y.o.   MRN: [id number]  HPI Patient comes into the office for removal of a tick on his right side.  Tick was first noticed earlier today and patient doesn't know how long it has been attached.  Patient said he first noticed the tick when it started itching earlier today.     Review of Systems  Constitutional: Negative for activity change, appetite change and fatigue.  Respiratory: Negative for shortness of breath and wheezing.   Cardiovascular: Negative for chest pain.  Skin: Negative for rash.  Neurological: Negative for dizziness and headaches.  All other systems reviewed and are negative.      Objective:   Physical Exam  Constitutional: He is oriented to person, place, and time. He appears well-developed and well-nourished. No distress.  HENT:  Head: Normocephalic.  Eyes: Pupils are equal, round, and reactive to light.  Neck: Normal range of motion. Neck supple.  Cardiovascular: Normal rate, regular rhythm and normal heart sounds.   Pulmonary/Chest: Effort normal and breath sounds normal. No respiratory distress. He has no wheezes.  Musculoskeletal: Normal range of motion.  Neurological: He is alert and oriented to person, place, and time.  Skin: Skin is warm and dry. No rash noted.  Psychiatric: He has a normal mood and affect. His behavior is normal. Judgment and thought content normal.   BP (!) 163/63   Pulse (!) 58   Temp 97.1 F (36.2 C) (Oral)   Ht 5\' 5"  (1.651 m)   Wt 180 lb (81.6 kg)   BMI 29.95 kg/m   Tick removed with pickup with teeth- was barely attached    Assessment & Plan:   1. Tick bite, initial encounter    Keep area clean and dry Avoid picking at area rto if develop a fever or rash  Mary-Margaret Hassell Done, FNP

## 2017-06-11 ENCOUNTER — Encounter: Payer: Self-pay | Admitting: Pharmacist

## 2017-06-12 ENCOUNTER — Encounter: Payer: Self-pay | Admitting: Family Medicine

## 2017-06-12 ENCOUNTER — Encounter: Payer: Self-pay | Admitting: *Deleted

## 2017-06-12 ENCOUNTER — Ambulatory Visit (INDEPENDENT_AMBULATORY_CARE_PROVIDER_SITE_OTHER): Payer: Medicare Other | Admitting: Pharmacist

## 2017-06-12 DIAGNOSIS — Z7901 Long term (current) use of anticoagulants: Secondary | ICD-10-CM | POA: Diagnosis not present

## 2017-06-12 DIAGNOSIS — Z86718 Personal history of other venous thrombosis and embolism: Secondary | ICD-10-CM

## 2017-06-12 DIAGNOSIS — D6859 Other primary thrombophilia: Secondary | ICD-10-CM

## 2017-06-12 LAB — COAGUCHEK XS/INR WAIVED
INR: 1.8 — AB (ref 0.9–1.1)
Prothrombin Time: 21.4 s

## 2017-06-14 ENCOUNTER — Other Ambulatory Visit: Payer: Self-pay | Admitting: Pharmacist

## 2017-06-19 ENCOUNTER — Other Ambulatory Visit: Payer: Self-pay | Admitting: Family Medicine

## 2017-06-26 ENCOUNTER — Encounter: Payer: Self-pay | Admitting: Interventional Cardiology

## 2017-06-26 ENCOUNTER — Encounter (INDEPENDENT_AMBULATORY_CARE_PROVIDER_SITE_OTHER): Payer: Self-pay

## 2017-06-26 ENCOUNTER — Ambulatory Visit (INDEPENDENT_AMBULATORY_CARE_PROVIDER_SITE_OTHER): Payer: Medicare Other | Admitting: Interventional Cardiology

## 2017-06-26 VITALS — BP 152/66 | HR 56 | Ht 65.0 in | Wt 180.8 lb

## 2017-06-26 DIAGNOSIS — I422 Other hypertrophic cardiomyopathy: Secondary | ICD-10-CM | POA: Insufficient documentation

## 2017-06-26 DIAGNOSIS — E782 Mixed hyperlipidemia: Secondary | ICD-10-CM | POA: Diagnosis not present

## 2017-06-26 DIAGNOSIS — Z7901 Long term (current) use of anticoagulants: Secondary | ICD-10-CM

## 2017-06-26 DIAGNOSIS — M25472 Effusion, left ankle: Secondary | ICD-10-CM

## 2017-06-26 DIAGNOSIS — M25471 Effusion, right ankle: Secondary | ICD-10-CM | POA: Diagnosis not present

## 2017-06-26 DIAGNOSIS — I480 Paroxysmal atrial fibrillation: Secondary | ICD-10-CM | POA: Diagnosis not present

## 2017-06-26 DIAGNOSIS — D6859 Other primary thrombophilia: Secondary | ICD-10-CM | POA: Diagnosis not present

## 2017-06-26 DIAGNOSIS — I493 Ventricular premature depolarization: Secondary | ICD-10-CM | POA: Insufficient documentation

## 2017-06-26 DIAGNOSIS — I10 Essential (primary) hypertension: Secondary | ICD-10-CM | POA: Diagnosis not present

## 2017-06-26 MED ORDER — LISINOPRIL 20 MG PO TABS: 30.0000 mg | ORAL_TABLET | Freq: Every day | ORAL | 2 refills | Status: DC

## 2017-06-26 NOTE — Progress Notes (Addendum)
Cardiology Office Note    Date:  06/26/2017   ID:  [name], DOB December 15, 1940, MRN [id number]  PCP:  Dettinger, Fransisca Kaufmann, MD  Cardiologist: Sinclair Grooms, MD   Chief Complaint  Patient presents with  . Cardiomyopathy    History of Present Illness:  Minas Bonser. is a 76 y.o. male referred by Dr. Fransisca Kaufmann. Dettinger for evaluation of heart murmur and abnormal echo demonstrating significant septal hypertrophy.  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 He has had prior paroxysmal atrial fibrillation in May 2017, history of splenic artery embolism and thrombosis, DVT, and was recently noted to have a soft systolic murmur on exam by Dr. Warrick Parisian. He denies syncope, palpitations, dyspnea, and edema. He is active around his house, able to complete yard work without limitation. He denies orthopnea.  Past Medical History:  Diagnosis Date  . Adenomatous colon polyp   . 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 Allergy   . Atrial fibrillation ([place]) 04/19/2016  . Cataract    small  . Diabetes mellitus   . Diverticulosis   . ED (erectile dysfunction)   . 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 Gallstones   . GERD (gastroesophageal reflux disease)   . Gout   . Heart murmur   . Hyperlipidemia   . Hypertension   . Internal hemorrhoids   . Pancreatitis   . 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  . Prostate cancer ([place])    Followed by Dr. Jeffie Pollock     Past Surgical History:  Procedure Laterality Date  . CHOLECYSTECTOMY N/A 11/26/2015   Procedure: LAPAROSCOPIC CHOLECYSTECTOMY;  Surgeon: Aviva Signs, MD;  Location: [facility];  Service: General;  Laterality: N/A;  . COLONOSCOPY  2012   [name]: moderate sigmoid diverticulosis, internal hemorrhoids. next tcs 2017  . FLEXIBLE BRONCHOSCOPY Bilateral 04/14/2016   Procedure: FLEXIBLE BRONCHOSCOPY;  Surgeon: Sinda Du, MD;  Location: [facility];  Service: Cardiopulmonary;   Laterality: Bilateral;  . SIGMOIDOSCOPY    . UMBILICAL HERNIA REPAIR N/A 11/26/2015   Procedure: UMBILICAL HERNIORRHAPHY;  Surgeon: Aviva Signs, MD;  Location: [facility];  Service: General;  Laterality: N/A;    Current Medications: Outpatient Medications Prior to Visit  Medication Sig Dispense Refill  . amLODipine (NORVASC) 10 MG tablet Take 1 tablet (10 mg total) by mouth every evening. 90 tablet 2  . finasteride (PROSCAR) 5 MG tablet Take 5 mg by mouth daily.    . fluticasone (FLONASE) 50 MCG/ACT nasal spray Place 1 spray into both nostrils 2 (two) times daily as needed for allergies or rhinitis. 16 g 6  . glucose blood (ONE TOUCH ULTRA TEST) test strip Use to check BG once daily.  Dx:  Type 2 DM controlled E11.9 100 each 4  . metFORMIN (GLUCOPHAGE) 500 MG tablet TAKE 1 TABLET BY MOUTH TWICE DAILY WITH MEALS 180 tablet 1  . metoprolol succinate (TOPROL-XL) 50 MG 24 hr tablet Take 1 tablet (50 mg total) by mouth daily. Take with or immediately following a meal. 90 tablet 1  . niacin (NIASPAN) 1000 MG CR tablet Take 1 tablet (1,000 mg total) by mouth at bedtime. 90 tablet 0  . Omega-3 Fatty Acids (FISH OIL) 1000 MG CAPS Take 2 capsules by mouth 2 (two) times daily.    Marland Kitchen omeprazole (PRILOSEC) 20 MG capsule TAKE ONE CAPSULE BY MOUTH ONCE DAILY 90 capsule 0  . pravastatin (PRAVACHOL) 40 MG tablet Take 1 tablet (  40 mg total) by mouth every evening. 90 tablet 1  . ULORIC 40 MG tablet TAKE 2 TABLETS BY MOUTH ONCE DAILY 90 tablet 0  . warfarin (COUMADIN) 5 MG tablet TAKE 1 TO 1+1/2 TABLETS BY DAILY AT 6PM 60 tablet 2  . lisinopril (PRINIVIL,ZESTRIL) 20 MG tablet Take 1 tablet (20 mg total) by mouth daily. 90 tablet 2   No facility-administered medications prior to visit.      Allergies:   Allopurinol and Colchicine   Social History   Social History  . Marital status: Married    Spouse name: N/A  . Number of children: N/A  . Years of education: N/A   Social History Main Topics  . Smoking  status: Former Smoker    Types: Cigarettes    Quit date: 12/11/1986  . Smokeless tobacco: Never Used     Comment: Quit x 50 years  . Alcohol use No  . Drug use: No  . Sexual activity: Yes   Other Topics Concern  . None   Social History Narrative  . None     Family History:  The patient's family history includes Alzheimer's disease in his mother; Brain cancer in his sister; Breast cancer in his sister; Deep vein thrombosis in his brother; Hypertension in his brother, father, mother, and sister; Stroke in his mother. The is no family history of sudden cardiac death. He is 1 of 7 siblings. No history of heart disease among them.  ROS:   Please see the history of present illness.    Prior history of gallstone pancreatitis that required parenteral hyperalimentation. Did not require surgery. He has diabetes. No bleeding on Coumadin. History of gout. No recent gout attacks. Quit smoking in 1988.  All other systems reviewed and are negative.   PHYSICAL EXAM:   VS:  BP (!) 152/66 (BP Location: Left Arm)   Pulse (!) 56   Ht 5\' 5"  (1.651 m)   Wt 180 lb 12.8 oz (82 kg)   BMI 30.09 kg/m    GEN: Well nourished, well developed, in no acute distress  HEENT: normal  Neck: no JVD, carotid bruits, or masses Cardiac: RRR; there is an S4 gallops and there is 1+ bilateral ankle edema . He has a very soft right upper sternal border systolic murmur. The murmur essentially disappears when lying flat. With standing and Valsalva there is no significant increase in intensity of the murmur. Respiratory:  clear to auscultation bilaterally, normal work of breathing GI: soft, nontender, nondistended, + BS MS: no deformity or atrophy  Skin: warm and dry, no rash Neuro:  Alert and Oriented x 3, Strength and sensation are intact Psych: euthymic mood, full affect  Wt Readings from Last 3 Encounters:  06/26/17 180 lb 12.8 oz (82 kg)  06/08/17 180 lb (81.6 kg)  05/08/17 178 lb (80.7 kg)      Studies/Labs  Reviewed:   EKG:  EKG  Sinus bradycardia, left atrial abnormality, ventricular hypertrophy with secondary repolarization abnormality.  Recent Labs: 11/03/2016: Hemoglobin 11.1; Platelets 275 05/08/2017: ALT 24; BUN 10; Creatinine, Ser 0.82; Potassium 3.9; Sodium 140   Lipid Panel    Component Value Date/Time   CHOL 85 (L) 05/08/2017 1142   CHOL 111 04/29/2013 1017   TRIG 157 (H) 05/08/2017 1142   TRIG 162 (H) 11/05/2013 1306   TRIG 236 (H) 04/29/2013 1017   HDL 27 (L) 05/08/2017 1142   HDL 33 (L) 11/05/2013 1306   HDL 31 (L) 04/29/2013 1017   CHOLHDL  3.1 05/08/2017 1142   LDLCALC 27 05/08/2017 1142   LDLCALC 44 11/05/2013 1306   LDLCALC 33 04/29/2013 1017    Additional studies/ records that were reviewed today include:   2-D Doppler echocardiogram June 2018: ------------------------------------------------------------------- Study Conclusions  - Left ventricle: The cavity size was normal. Wall thickness was   increased in a pattern of moderate LVH. There was severe   asymmetric septal hypertrophy. No definite significant LVOT   gradient. Systolic function was vigorous. The estimated ejection   fraction was in the range of 65% to 70%. Wall motion was normal;   there were no regional wall motion abnormalities. Doppler   parameters are consistent with abnormal left ventricular   relaxation (grade 1 diastolic dysfunction). - Aortic valve: Mildly calcified annulus. Trileaflet. There was   mild regurgitation. - Mitral valve: Mildly calcified annulus. There was trivial   regurgitation. - Left atrium: The atrium was mildly dilated. - Right atrium: Central venous pressure (est): 3 mm Hg. - Tricuspid valve: There was trivial regurgitation. - Pulmonary arteries: PA peak pressure: 29 mm Hg (S). - Pericardium, extracardiac: There was no pericardial effusion.  Impressions:  - Moderate LVH with septal hypertrophy, no definite significant   LVOT gradient, LVEF 65-70%. Grade 1  diastolic dysfunction. Mild   left atrial enlargement. Mildly calcified mitral annulus with   trivial mitral regurgitation. Mildly calcified aortic annulus   with mild aortic regurgitation. Trivial tricuspid regurgitation   with PASP 29 mmHg.   In looking at the report the posterior wall and septal thicknesses are equivalent and there is no significant asymmetry based purely on the measurements. I did not independently review the images.  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 PVC (premature ventricular contraction)   3. Essential hypertension   4. Paroxysmal atrial fibrillation (HCC)   5. Ankle edema, bilateral   6. Primary hypercoagulable state ([place]) [D68.59]   7. Long term (current) use of anticoagulants [Z79.01]   8. Mixed hyperlipidemia      PLAN:  In order of problems listed above:  1. Significant hypertrophy with free wall and septum measuring around 18 mm. EF 65%. No inducible murmur by exam or evidence of LV outflow obstruction by recent echo noted above. No family history of sudden death. No cardiac complaints. Plan 48 hour Holter to exclude silent high-grade arrhythmia/atrial fibrillation. May need to consider MRI if any significant ectopy is noted. 2. A PVC was noted on 12-lead EKG. Just plan to do a 48 hour Holter as a screen to rule out significant arrhythmia. 3. The systolic pressure is too high. Further increase lisinopril to 30 mg per day. He may need to add low-dose diuretic therapy to get better systolic control which I wouldn't prefer to be less than 140 mmHg. 4. Occurred during acute illness. No apparent recurrences that have been symptomatic. He is on chronic anticoagulation due to an underlying hypercoagulable state. 5. Related to high-dose amlodipine. 6. Not addressed other than noting anticoagulation therapy. 7. No bleeding on Coumadin.   Plan 48 hour Holter, increase lisinopril to 30 mg per day to better control the systolic pressure and  prevent progression of LVH with possible. Clinical follow-up in 6 weeks with a basic metabolic panel. Consider adding low-dose diuretic therapy on that visit if we are not at target blood pressure.    Medication Adjustments/Labs and Tests Ordered: Current medicines are reviewed at length with the patient today.  Concerns regarding medicines are outlined above.  Medication changes,  Labs and Tests ordered today are listed in the Patient Instructions below. Patient Instructions  Medication Instructions:  1) INCREASE Lisinopril to 30mg  once daily  Labwork: BMET at time of next appointment,  Testing/Procedures: Your physician has recommended that you wear a 48 hour holter monitor. Holter monitors are medical devices that record the heart's electrical activity. Doctors most often use these monitors to diagnose arrhythmias. Arrhythmias are problems with the speed or rhythm of the heartbeat. The monitor is a small, portable device. You can wear one while you do your normal daily activities. This is usually used to diagnose what is causing palpitations/syncope (passing out).    Follow-Up: Your physician recommends that you schedule a follow-up appointment in: 6 weeks with Dr.Tikesha Mort.    Any Other Special Instructions Will Be Listed Below (If Applicable).     If you need a refill on your cardiac medications before your next appointment, please call your pharmacy.      Signed, Sinclair Grooms, MD  06/26/2017 10:54 AM    [facility] [address], [place], [place]  [address] Phone: [phone]; Fax: [phone]

## 2017-06-26 NOTE — Patient Instructions (Signed)
Medication Instructions:  1) INCREASE Lisinopril to 30mg  once daily  Labwork: BMET at time of next appointment,  Testing/Procedures: Your physician has recommended that you wear a 48 hour holter monitor. Holter monitors are medical devices that record the heart's electrical activity. Doctors most often use these monitors to diagnose arrhythmias. Arrhythmias are problems with the speed or rhythm of the heartbeat. The monitor is a small, portable device. You can wear one while you do your normal daily activities. This is usually used to diagnose what is causing palpitations/syncope (passing out).    Follow-Up: Your physician recommends that you schedule a follow-up appointment in: 6 weeks with Dr.Smith.    Any Other Special Instructions Will Be Listed Below (If Applicable).     If you need a refill on your cardiac medications before your next appointment, please call your pharmacy.

## 2017-07-04 ENCOUNTER — Ambulatory Visit (INDEPENDENT_AMBULATORY_CARE_PROVIDER_SITE_OTHER): Payer: Medicare Other

## 2017-07-04 DIAGNOSIS — I493 Ventricular premature depolarization: Secondary | ICD-10-CM | POA: Diagnosis not present

## 2017-07-04 DIAGNOSIS — I422 Other hypertrophic cardiomyopathy: Secondary | ICD-10-CM | POA: Diagnosis not present

## 2017-07-12 ENCOUNTER — Ambulatory Visit (INDEPENDENT_AMBULATORY_CARE_PROVIDER_SITE_OTHER): Payer: Medicare Other | Admitting: Pharmacist

## 2017-07-12 DIAGNOSIS — Z86718 Personal history of other venous thrombosis and embolism: Secondary | ICD-10-CM

## 2017-07-12 DIAGNOSIS — Z7901 Long term (current) use of anticoagulants: Secondary | ICD-10-CM

## 2017-07-12 DIAGNOSIS — D6859 Other primary thrombophilia: Secondary | ICD-10-CM

## 2017-07-12 LAB — COAGUCHEK XS/INR WAIVED
INR: 2 — ABNORMAL HIGH (ref 0.9–1.1)
Prothrombin Time: 23.9 s

## 2017-07-12 NOTE — Patient Instructions (Signed)
Anticoagulation Warfarin Dose Instructions as of 07/12/2017      Alan [name] Tue Wed Thu Fri Sat   New Dose 5 mg 7.5 mg 7.5 mg 7.5 mg 7.5 mg 5 mg 7.5 mg    Description   Continue current warfarin 5mg  dose - take 1 tablet on Sundays and Fridays only.  Take 1 and 1/2 tablets all the rest of the day.  INR was 2.0 today

## 2017-07-15 ENCOUNTER — Encounter: Payer: Self-pay | Admitting: Interventional Cardiology

## 2017-07-16 ENCOUNTER — Other Ambulatory Visit: Payer: Self-pay | Admitting: Pharmacist

## 2017-07-16 DIAGNOSIS — I10 Essential (primary) hypertension: Secondary | ICD-10-CM

## 2017-07-17 ENCOUNTER — Telehealth: Payer: Self-pay | Admitting: Interventional Cardiology

## 2017-07-17 NOTE — Telephone Encounter (Signed)
Went over results of monitor with pt.  Pt verbalized understanding and was appreciative for call.

## 2017-07-17 NOTE — Telephone Encounter (Signed)
New message   Patient returning call back to CMA on test results

## 2017-08-09 ENCOUNTER — Ambulatory Visit (INDEPENDENT_AMBULATORY_CARE_PROVIDER_SITE_OTHER): Payer: Medicare Other | Admitting: Family Medicine

## 2017-08-09 VITALS — BP 162/64 | HR 53 | Temp 98.2°F | Ht 65.0 in | Wt 176.0 lb

## 2017-08-09 DIAGNOSIS — K219 Gastro-esophageal reflux disease without esophagitis: Secondary | ICD-10-CM

## 2017-08-09 DIAGNOSIS — I10 Essential (primary) hypertension: Secondary | ICD-10-CM | POA: Diagnosis not present

## 2017-08-09 DIAGNOSIS — I480 Paroxysmal atrial fibrillation: Secondary | ICD-10-CM | POA: Diagnosis not present

## 2017-08-09 DIAGNOSIS — Z7901 Long term (current) use of anticoagulants: Secondary | ICD-10-CM | POA: Diagnosis not present

## 2017-08-09 DIAGNOSIS — E119 Type 2 diabetes mellitus without complications: Secondary | ICD-10-CM | POA: Diagnosis not present

## 2017-08-09 DIAGNOSIS — E782 Mixed hyperlipidemia: Secondary | ICD-10-CM | POA: Diagnosis not present

## 2017-08-09 LAB — COAGUCHEK XS/INR WAIVED
INR: 4 — ABNORMAL HIGH (ref 0.9–1.1)
Prothrombin Time: 47.6 s

## 2017-08-09 LAB — BAYER DCA HB A1C WAIVED: HB A1C (BAYER DCA - WAIVED): 7.6 % — ABNORMAL HIGH (ref <7.0)

## 2017-08-09 MED ORDER — METFORMIN HCL 1000 MG PO TABS: 1000.0000 mg | ORAL_TABLET | Freq: Two times a day (BID) | ORAL | 1 refills | Status: DC

## 2017-08-09 NOTE — Progress Notes (Signed)
BP (!) 162/64   Pulse (!) 53   Temp 98.2 F (36.8 C) (Oral)   Ht 5\' 5"  (1.651 m)   Wt 176 lb (79.8 kg)   BMI 29.29 kg/m    Subjective:    Patient ID: Alan Netters., male    DOB: [birth year], 76 y.o.   MRN: [id number]  HPI: Alan Hidalgo. is a 76 y.o. male presenting on 08/09/2017 for Hyperlipidemia (follow up; patient is fasting); Diabetes; and Hypertension   HPI Hypertension Patient is currently on Metoprolol and lisinopril and amlodipine, and their blood pressure today is 162/64, we are not having tighter control because of lightheadedness and dizziness but will monitor for now. Patient denies any lightheadedness or dizziness. Patient denies headaches, blurred vision, chest pains, shortness of breath, or weakness. Denies any side effects from medication and is content with current medication.   Hyperlipidemia Patient is coming in for recheck of his hyperlipidemia. The patient is currently taking fish oils and pravastatin. They deny any issues with myalgias or history of liver damage from it. They deny any focal numbness or weakness or chest pain.   Type 2 diabetes mellitus Patient comes in today for recheck of his diabetes. Patient has been currently taking metformin. Patient is currently on an ACE inhibitor/ARB. Patient has seen an ophthalmologist this year. Patient denies any issues with their feet.   GERD Patient is currently on omeprazole and says he is doing very well on it.  She denies any major symptoms or abdominal pain or belching or burping. She denies any blood in her stool or lightheadedness or dizziness.   A. fib and use of anticoagulant Patient is on coumadin currently for A. fib that is paroxysmal and he comes in and out of it. He has been taking it every night as prescribed and denies any bleeding or major bruising episodes. He denies any chest pain or shortness of breath.  Relevant past medical, surgical, family and social history reviewed and updated as  indicated. Interim medical history since our last visit reviewed. Allergies and medications reviewed and updated.  Review of Systems  Constitutional: Negative for chills and fever.  Eyes: Negative for discharge.  Respiratory: Negative for shortness of breath and wheezing.   Cardiovascular: Negative for chest pain and leg swelling.  Gastrointestinal: Negative for abdominal pain, blood in stool, constipation and vomiting.  Musculoskeletal: Negative for back pain and gait problem.  Skin: Negative for rash.  Neurological: Negative for dizziness, weakness, light-headedness and numbness.  All other systems reviewed and are negative.   Per HPI unless specifically indicated above     Objective:    BP (!) 162/64   Pulse (!) 53   Temp 98.2 F (36.8 C) (Oral)   Ht 5\' 5"  (1.651 m)   Wt 176 lb (79.8 kg)   BMI 29.29 kg/m   Wt Readings from Last 3 Encounters:  08/09/17 176 lb (79.8 kg)  06/26/17 180 lb 12.8 oz (82 kg)  06/08/17 180 lb (81.6 kg)    Physical Exam  Constitutional: He is oriented to person, place, and time. He appears well-developed and well-nourished. No distress.  Eyes: Conjunctivae are normal. No scleral icterus.  Neck: Neck supple. No thyromegaly present.  Cardiovascular: Normal rate, regular rhythm, normal heart sounds and intact distal pulses.   No murmur heard. Pulmonary/Chest: Effort normal and breath sounds normal. No respiratory distress. He has no wheezes.  Abdominal: Soft. Bowel sounds are normal. He exhibits no distension. There is no tenderness. There  is no rebound.  Musculoskeletal: Normal range of motion. He exhibits no edema.  Lymphadenopathy:    He has no cervical adenopathy.  Neurological: He is alert and oriented to person, place, and time. Coordination normal.  Skin: Skin is warm and dry. No rash noted. He is not diaphoretic.  Psychiatric: He has a normal mood and affect. His behavior is normal.  Nursing note and vitals reviewed.   Anticoagulation  Warfarin Dose Instructions as of 08/09/2017      Dorene Grebe Tue Wed Thu Fri Sat   New Dose 5 mg 7.5 mg 5 mg 7.5 mg 5 mg 5 mg 7.5 mg    Description   Decrease current warfarin 5mg  dose - take 1 tablet on Sundays, Tuesdays, Thursdays and Fridays only.  Take 1 and 1/2 tablets all the rest of the day.  INR was 4.0 today      A1c of 7.6    Assessment & Plan:   Problem List Items Addressed This Visit      Cardiovascular and Mediastinum   Essential hypertension   Paroxysmal atrial fibrillation (HCC)   Relevant Orders   CoaguChek XS/INR Waived (Completed)     Digestive   GERD (gastroesophageal reflux disease)     Endocrine   Type 2 diabetes mellitus without complications ([place]) - Primary   Relevant Medications   metFORMIN (GLUCOPHAGE) 1000 MG tablet   Other Relevant Orders   Bayer DCA Hb A1c Waived (Completed)     Other   HLD (hyperlipidemia)   Long term (current) use of anticoagulants [Z79.01]   Relevant Orders   CoaguChek XS/INR Waived (Completed)     Follow up plan: Return in about 4 weeks (around 09/06/2017), or if symptoms worsen or fail to improve, for 4 weeks Coumadin checked with Cyril Mourning, 3 months diabetes check with [place].  Counseling provided for all of the vaccine components Orders Placed This Encounter  Procedures  . Bayer DCA Hb A1c Waived  . CoaguChek XS/INR [place], MD [facility] 08/09/2017, 12:15 PM

## 2017-08-09 NOTE — Patient Instructions (Addendum)
Anticoagulation Warfarin Dose Instructions as of 08/09/2017      Dorene Grebe Tue Wed Thu Fri Sat   New Dose 5 mg 7.5 mg 5 mg 7.5 mg 5 mg 5 mg 7.5 mg    Description   Decrease current warfarin 5mg  dose - take 1 tablet on Sundays, Tuesdays, Thursdays and Fridays only.  Take 1 and 1/2 tablets all the rest of the day.  INR was 4.0 today

## 2017-08-10 ENCOUNTER — Encounter: Payer: Self-pay | Admitting: Interventional Cardiology

## 2017-08-10 ENCOUNTER — Ambulatory Visit (INDEPENDENT_AMBULATORY_CARE_PROVIDER_SITE_OTHER): Payer: Medicare Other | Admitting: Interventional Cardiology

## 2017-08-10 VITALS — BP 160/60 | HR 63 | Ht 65.0 in | Wt 176.0 lb

## 2017-08-10 DIAGNOSIS — I480 Paroxysmal atrial fibrillation: Secondary | ICD-10-CM

## 2017-08-10 DIAGNOSIS — I824Y9 Acute embolism and thrombosis of unspecified deep veins of unspecified proximal lower extremity: Secondary | ICD-10-CM

## 2017-08-10 DIAGNOSIS — I493 Ventricular premature depolarization: Secondary | ICD-10-CM | POA: Diagnosis not present

## 2017-08-10 DIAGNOSIS — I422 Other hypertrophic cardiomyopathy: Secondary | ICD-10-CM

## 2017-08-10 DIAGNOSIS — I10 Essential (primary) hypertension: Secondary | ICD-10-CM

## 2017-08-10 MED ORDER — METOPROLOL SUCCINATE ER 25 MG PO TB24: 75.0000 mg | ORAL_TABLET | Freq: Every day | ORAL | 3 refills | Status: DC

## 2017-08-10 NOTE — Patient Instructions (Signed)
Medication Instructions:  1) Increase Metoprolol Succinate to 75mg  once daily  Labwork: None  Testing/Procedures: None  Follow-Up: Your physician recommends that you schedule a follow-up appointment in: 3 months with Dr. Tamala Julian with an EKG.    Any Other Special Instructions Will Be Listed Below (If Applicable).     If you need a refill on your cardiac medications before your next appointment, please call your pharmacy.

## 2017-08-10 NOTE — Progress Notes (Signed)
Cardiology Office Note    Date:  08/10/2017   ID:  [name], DOB [birth year], MRN [id number]  PCP:  Dettinger, Fransisca Kaufmann, MD  Cardiologist: Sinclair Grooms, MD   Chief Complaint  Patient presents with  . Atrial Fibrillation  . Cardiomyopathy    Hypertrophic    History of Present Illness:  Alan [name]. is a 76 y.o. male referred by Dr. Fransisca Kaufmann. Dettinger for evaluation of heart murmur and abnormal echo demonstrating significant septal hypertrophy.  Came in with his wife. He is asymptomatic. No difficulty with increase in metoprolol to 50 mg per day. He denies syncope, angina, and dyspnea. No family history of sudden death. No history of cardiomyopathy or other cardiac issues. He is physically active, works considerably outdoors, and has no particular limitations. He has never fainted.   Past Medical History:  Diagnosis Date  . Adenomatous colon polyp   . Agranulocytosis ([place])   . Allergy   . Atrial fibrillation ([place]) 04/19/2016  . Cataract    small  . Diabetes mellitus   . Diverticulosis   . ED (erectile dysfunction)   . 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 Gallstones   . GERD (gastroesophageal reflux disease)   . Gout   . Heart murmur   . Hyperlipidemia   . Hypertension   . Internal hemorrhoids   . Pancreatitis   . 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  . Prostate cancer ([place])    Followed by Dr. Jeffie Pollock     Past Surgical History:  Procedure Laterality Date  . CHOLECYSTECTOMY N/A 11/26/2015   Procedure: LAPAROSCOPIC CHOLECYSTECTOMY;  Surgeon: Aviva Signs, MD;  Location: [facility];  Service: General;  Laterality: N/A;  . COLONOSCOPY  2012   [name]: moderate sigmoid diverticulosis, internal hemorrhoids. next tcs 2017  . FLEXIBLE BRONCHOSCOPY Bilateral 04/14/2016   Procedure: FLEXIBLE BRONCHOSCOPY;  Surgeon: Sinda Du, MD;  Location: [facility] SU[facility];  Service: Cardiopulmonary;  Laterality: Bilateral;  .  SIGMOIDOSCOPY    . UMBILICAL HERNIA REPAIR N/A 11/26/2015   Procedure: UMBILICAL HERNIORRHAPHY;  Surgeon: Aviva Signs, MD;  Location: [facility] ORS;  Service: General;  Laterality: N/A;    Current Medications: Outpatient Medications Prior to Visit  Medication Sig Dispense Refill  . amLODipine (NORVASC) 10 MG tablet Take 1 tablet (10 mg total) by mouth every evening. 90 tablet 2  . finasteride (PROSCAR) 5 MG tablet Take 5 mg by mouth daily.    . fluticasone (FLONASE) 50 MCG/ACT nasal spray Place 1 spray into both nostrils 2 (two) times daily as needed for allergies or rhinitis. 16 g 6  . glucose blood (ONE TOUCH ULTRA TEST) test strip Use to check BG once daily.  Dx:  Type 2 DM controlled E11.9 100 each 4  . lisinopril (PRINIVIL,ZESTRIL) 20 MG tablet Take 1.5 tablets (30 mg total) by mouth daily. 135 tablet 2  . metFORMIN (GLUCOPHAGE) 1000 MG tablet Take 1 tablet (1,000 mg total) by mouth 2 (two) times daily with a meal. 180 tablet 1  . niacin (NIASPAN) 1000 MG CR tablet Take 1 tablet (1,000 mg total) by mouth at bedtime. 90 tablet 0  . Omega-3 Fatty Acids (FISH OIL) 1000 MG CAPS Take 2 capsules by mouth 2 (two) times daily.    Marland Kitchen omeprazole (PRILOSEC) 20 MG capsule TAKE ONE CAPSULE BY MOUTH ONCE DAILY 90 capsule 0  . pravastatin (PRAVACHOL) 40 MG tablet Take 1 tablet (40 mg total) by mouth every evening. Atkins  tablet 1  . ULORIC 40 MG tablet TAKE 2 TABLETS BY MOUTH ONCE DAILY 90 tablet 0  . warfarin (COUMADIN) 5 MG tablet TAKE 1 TO 1+1/2 TABLETS BY DAILY AT 6PM 60 tablet 2  . metoprolol succinate (TOPROL-XL) 50 MG 24 hr tablet TAKE ONE TABLET BY MOUTH ONCE DAILY WITH MEALS OR  IMMEDIATELY  FOLLOWING A MEAL 90 tablet 1   No facility-administered medications prior to visit.      Allergies:   Allopurinol and Colchicine   Social History   Social History  . Marital status: Married    Spouse name: N/A  . Number of children: N/A  . Years of education: N/A   Social History Main Topics  . Smoking  status: Former Smoker    Types: Cigarettes    Quit date: 12/11/1986  . Smokeless tobacco: Never Used     Comment: Quit x 50 years  . Alcohol use No  . Drug use: No  . Sexual activity: Yes   Other Topics Concern  . None   Social History Narrative  . None     Family History:  The patient's family history includes Alzheimer's disease in his mother; Brain cancer in his sister; Breast cancer in his sister; Deep vein thrombosis in his brother; Hypertension in his brother, father, mother, and sister; Stroke in his mother.   ROS:   Please see the history of present illness.    None.  All other systems reviewed and are negative.   PHYSICAL EXAM:   VS:  BP (!) 160/60 (BP Location: Right Arm)   Pulse 63   Ht 5\' 5"  (1.651 m)   Wt 176 lb (79.8 kg)   BMI 29.29 kg/m    GEN: Well nourished, well developed, in no acute distress  HEENT: normal  Neck: no JVD, carotid bruits, or masses Cardiac: RRR; no murmurs, rubs. An S4 gallop is audible but very is no edema . Respiratory:  clear to auscultation bilaterally, normal work of breathing GI: soft, nontender, nondistended, + BS MS: no deformity or atrophy  Skin: warm and dry, no rash Neuro:  Alert and Oriented x 3, Strength and sensation are intact Psych: euthymic mood, full affect  Wt Readings from Last 3 Encounters:  08/10/17 176 lb (79.8 kg)  08/09/17 176 lb (79.8 kg)  06/26/17 180 lb 12.8 oz (82 kg)      Studies/Labs Reviewed:   EKG:  EKG  No edema. Prior EKG demonstrated left ventricular hypertrophy with strain.  Recent Labs: 11/03/2016: Hemoglobin 11.1; Platelets 275 05/08/2017: ALT 24; BUN 10; Creatinine, Ser 0.82; Potassium 3.9; Sodium 140   Lipid Panel    Component Value Date/Time   CHOL 85 (L) 05/08/2017 1142   CHOL 111 04/29/2013 1017   TRIG 157 (H) 05/08/2017 1142   TRIG 162 (H) 11/05/2013 1306   TRIG 236 (H) 04/29/2013 1017   HDL 27 (L) 05/08/2017 1142   HDL 33 (L) 11/05/2013 1306   HDL 31 (L) 04/29/2013 1017    CHOLHDL 3.1 05/08/2017 1142   LDLCALC 27 05/08/2017 1142   LDLCALC 44 11/05/2013 1306   LDLCALC 33 04/29/2013 1017    Additional studies/ records that were reviewed today include:  Echocardiogram 2018: ------------------------------------------------------------------- Study Conclusions  - Left ventricle: The cavity size was normal. Wall thickness was   increased in a pattern of moderate LVH. There was severe   asymmetric septal hypertrophy. No definite significant LVOT   gradient. Systolic function was vigorous. The estimated ejection   fraction  was in the range of 65% to 70%. Wall motion was normal;   there were no regional wall motion abnormalities. Doppler   parameters are consistent with abnormal left ventricular   relaxation (grade 1 diastolic dysfunction). - Aortic valve: Mildly calcified annulus. Trileaflet. There was   mild regurgitation. - Mitral valve: Mildly calcified annulus. There was trivial   regurgitation. - Left atrium: The atrium was mildly dilated. - Right atrium: Central venous pressure (est): 3 mm Hg. - Tricuspid valve: There was trivial regurgitation. - Pulmonary arteries: PA peak pressure: 29 mm Hg (S). - Pericardium, extracardiac: There was no pericardial effusion.  Impressions:  - Moderate LVH with septal hypertrophy, no definite significant   LVOT gradient, LVEF 65-70%. Grade 1 diastolic dysfunction. Mild   left atrial enlargement. Mildly calcified mitral annulus with   trivial mitral regurgitation. Mildly calcified aortic annulus   with mild aortic regurgitation. Trivial tricuspid regurgitation   with PASP 29 mmHg.   48 hour Holter monitor July 2018: Study Highlights     NSR  Paroxysmal atrial flutter with occasional RVR  PAC's and PVC's noted.  PVCs were present 1% of cardiac activity.   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 PVC (premature ventricular  contraction)   3. 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 Paroxysmal atrial fibrillation ([place])   5. Essential hypertension      PLAN:  In order of problems listed above:  1. Increase metoprolol succinate to 75 mg per day. Clinical follow-up in 3 months. Discussed hypertrophic cardiomyopathy. Discussed the possibility that hypertrophy is physiologically related to hypertension. Also discussed the possibility that he has hypertrophic cardiomyopathy related to a mutation. We may consider performing an MRI. The Holter monitor did not demonstrate significant ectopy. Cautioned that he should call if syncope, chest discomfort, or dyspnea. 2. No high risk features on Holter monitor. 3. Not addressed 4. Atrial flutter was noted on 48 hour Holter. Continue anticoagulation. 5. Inadequate control. Increase metoprolol succinate to 75 mg per day. 3 month return appointment. Consider further increase. We'll withhold diuretic therapy given LV outflow obstruction noted on echo.  We may still need to consider a cardiac MRI but for the time being and will concentrate on blood pressure control. Clinical follow-up in 3 months. ECG will be done at that time.  Medication Adjustments/Labs and Tests Ordered: Current medicines are reviewed at length with the patient today.  Concerns regarding medicines are outlined above.  Medication changes, Labs and Tests ordered today are listed in the Patient Instructions below. Patient Instructions  Medication Instructions:  1) Increase Metoprolol Succinate to 75mg  once daily  Labwork: None  Testing/Procedures: None  Follow-Up: Your physician recommends that you schedule a follow-up appointment in: 3 months with Dr. Tamala Julian with an EKG.    Any Other Special Instructions Will Be Listed Below (If Applicable).     If you need a refill on your cardiac medications before your next appointment,  please call your pharmacy.      Signed, Sinclair Grooms, MD  08/10/2017 4:28 PM    [facility] [address], [place], [place]  [address] Phone: [phone]; Fax: [phone]

## 2017-08-14 ENCOUNTER — Other Ambulatory Visit: Payer: Self-pay | Admitting: *Deleted

## 2017-08-14 MED ORDER — FEBUXOSTAT 40 MG PO TABS: 80.0000 mg | ORAL_TABLET | Freq: Every day | ORAL | 0 refills | Status: DC

## 2017-08-31 ENCOUNTER — Ambulatory Visit (INDEPENDENT_AMBULATORY_CARE_PROVIDER_SITE_OTHER): Payer: Medicare Other | Admitting: Family Medicine

## 2017-08-31 ENCOUNTER — Encounter: Payer: Self-pay | Admitting: Family Medicine

## 2017-08-31 VITALS — BP 132/64 | HR 76 | Temp 98.0°F | Ht 65.0 in | Wt 173.0 lb

## 2017-08-31 DIAGNOSIS — I480 Paroxysmal atrial fibrillation: Secondary | ICD-10-CM | POA: Diagnosis not present

## 2017-08-31 DIAGNOSIS — Z01818 Encounter for other preprocedural examination: Secondary | ICD-10-CM | POA: Diagnosis not present

## 2017-08-31 LAB — COAGUCHEK XS/INR WAIVED
INR: 2.9 — ABNORMAL HIGH (ref 0.9–1.1)
PROTHROMBIN TIME: 35.3 s

## 2017-08-31 MED ORDER — ENOXAPARIN SODIUM 40 MG/0.4ML ~~LOC~~ SOLN: 40.0000 mg | SUBCUTANEOUS | 0 refills | Status: DC

## 2017-08-31 NOTE — Patient Instructions (Addendum)
Hold Coumadin 5 days before procedure.  Start Lovenox 4 days before procedure and continue until the day before procedure  Restart Coumadin with a 10 mg dose the day after procedure and then resume normal dose  Anticoagulation Warfarin Dose Instructions as of 08/31/2017      Dorene Grebe Tue Wed Thu Fri Sat   New Dose 5 mg 7.5 mg 5 mg 7.5 mg 5 mg 5 mg 7.5 mg    Description   take 1 tablet on Sundays, Tuesdays, Thursdays and Fridays only.  Take 1 and 1/2 tablets all the rest of the days.  INR was 2.9 today

## 2017-08-31 NOTE — Progress Notes (Signed)
BP 132/64   Pulse 76   Temp 98 F (36.7 C) (Oral)   Ht 5\' 5"  (1.651 m)   Wt 173 lb (78.5 kg)   BMI 28.79 kg/m    Subjective:    Patient ID: Alan [name]., male    DOB: [birth year], 76 y.o.   MRN: [id number]  HPI: Alan [name]. is a 76 y.o. male presenting on 08/31/2017 for Protime/INR check and Discuss plan for Coumadin (upcoming tooth extraction)   HPI INR recheck and preoperative planning for dental extraction Patient is coming in for an INR recheck in preoperative planning for dental extraction. His INR today is 2.9. He is doing relatively very well. We brought him in so we can explain what we need to do a want to do for the Coumadin. The dentist very adamant that they want [place] to stop the Coumadin. We will hold the Coumadin 5 days before and start Lovenox the next day until the day before the procedure. We will hold all anticoagulation the day of the procedure and then restart Coumadin with 10 mg the day after. Patient is understanding with this and gave him a printout of the instructions and sent a prescription for Lovenox. He denies any bleeding issues.  Relevant past medical, surgical, family and social history reviewed and updated as indicated. Interim medical history since our last visit reviewed. Allergies and medications reviewed and updated.  Review of Systems  Constitutional: Negative for chills and fever.  Respiratory: Negative for shortness of breath and wheezing.   Cardiovascular: Negative for chest pain and leg swelling.  Gastrointestinal: Negative for blood in stool.  Musculoskeletal: Negative for back pain and gait problem.  Skin: Negative for rash.  All other systems reviewed and are negative.   Per HPI unless specifically indicated above     Objective:    BP 132/64   Pulse 76   Temp 98 F (36.7 C) (Oral)   Ht 5\' 5"  (1.651 m)   Wt 173 lb (78.5 kg)   BMI 28.79 kg/m   Wt Readings from Last 3 Encounters:  08/31/17 173 lb (78.5 kg)  08/10/17 176 lb  (79.8 kg)  08/09/17 176 lb (79.8 kg)    Physical Exam  Constitutional: He appears well-developed and well-nourished. No distress.  Eyes: Conjunctivae are normal. No scleral icterus.  Musculoskeletal: Normal range of motion. He exhibits no edema.  Neurological: He is alert.  Skin: He is not diaphoretic.  Psychiatric: He has a normal mood and affect. His behavior is normal.  Nursing note and vitals reviewed.   Hold Coumadin 5 days before procedure.  Start Lovenox 4 days before procedure and continue until the day before procedure  Restart Coumadin with a 10 mg dose the day after procedure and then resume normal dose  Anticoagulation Warfarin Dose Instructions as of 08/31/2017      Dorene Grebe Tue Wed Thu Fri Sat   New Dose 5 mg 7.5 mg 5 mg 7.5 mg 5 mg 5 mg 7.5 mg    Description   take 1 tablet on Sundays, Tuesdays, Thursdays and Fridays only.  Take 1 and 1/2 tablets all the rest of the days.  INR was 2.9 today        Assessment & Plan:   Problem List Items Addressed This Visit      Cardiovascular and Mediastinum   Paroxysmal atrial fibrillation (HCC) - Primary   Relevant Medications   enoxaparin (LOVENOX) 40 MG/0.4ML injection   Other Relevant Orders  CoaguChek XS/INR Waived (Completed)    Other Visit Diagnoses    Preoperative testing       Relevant Medications   enoxaparin (LOVENOX) 40 MG/0.4ML injection       Follow up plan: Return if symptoms worsen or fail to improve.  Counseling provided for all of the vaccine components Orders Placed This Encounter  Procedures  . CoaguChek XS/INR [place], MD [facility] 08/31/2017, 11:36 AM

## 2017-09-07 ENCOUNTER — Encounter: Payer: Self-pay | Admitting: Pharmacist Clinician (PhC)/ Clinical Pharmacy Specialist

## 2017-09-11 ENCOUNTER — Ambulatory Visit (INDEPENDENT_AMBULATORY_CARE_PROVIDER_SITE_OTHER): Payer: Medicare Other

## 2017-09-11 DIAGNOSIS — Z23 Encounter for immunization: Secondary | ICD-10-CM | POA: Diagnosis not present

## 2017-09-20 ENCOUNTER — Other Ambulatory Visit: Payer: Self-pay

## 2017-09-20 ENCOUNTER — Telehealth: Payer: Self-pay | Admitting: Family Medicine

## 2017-09-20 MED ORDER — ALLOPURINOL 100 MG PO TABS: 100.0000 mg | ORAL_TABLET | Freq: Every day | ORAL | 6 refills | Status: DC

## 2017-09-20 NOTE — Telephone Encounter (Signed)
Spoke with patient, he will start allopurinol, call in new script to [facility]

## 2017-09-20 NOTE — Telephone Encounter (Signed)
The only real other option is for him to go back on the allopurinol, he can start on 100 mg daily

## 2017-10-18 ENCOUNTER — Other Ambulatory Visit: Payer: Self-pay | Admitting: Family Medicine

## 2017-10-18 ENCOUNTER — Other Ambulatory Visit: Payer: Self-pay

## 2017-10-19 ENCOUNTER — Ambulatory Visit (INDEPENDENT_AMBULATORY_CARE_PROVIDER_SITE_OTHER): Payer: Medicare Other | Admitting: Pharmacist Clinician (PhC)/ Clinical Pharmacy Specialist

## 2017-10-19 DIAGNOSIS — D6859 Other primary thrombophilia: Secondary | ICD-10-CM | POA: Diagnosis not present

## 2017-10-19 DIAGNOSIS — Z7901 Long term (current) use of anticoagulants: Secondary | ICD-10-CM | POA: Diagnosis not present

## 2017-10-19 DIAGNOSIS — Z86718 Personal history of other venous thrombosis and embolism: Secondary | ICD-10-CM

## 2017-10-19 LAB — COAGUCHEK XS/INR WAIVED
INR: 2.1 — AB (ref 0.9–1.1)
Prothrombin Time: 25.4 s

## 2017-10-19 NOTE — Patient Instructions (Signed)
Description   take 1 tablet on Sundays, Tuesdays, Thursdays and Fridays only.  Take 1 and 1/2 tablets all the rest of the days.  INR was 2.1 today

## 2017-10-24 ENCOUNTER — Other Ambulatory Visit: Payer: Self-pay | Admitting: *Deleted

## 2017-10-24 NOTE — Patient Outreach (Signed)
[facility]) Care Management  10/24/2017  Bland Rudzinski. 07/04/1941 [id number]  EMMI-Prevent referral via engagement tool:  Telephone call #1 to patient; left HIPPA compliant voice mail requesting return call.  Plan: Will follow up.  Sherrin Daisy, RN BSN [place] Management Coordinator [facility] Care Management  [phone]

## 2017-10-24 NOTE — Progress Notes (Signed)
This encounter was created in error - please disregard.

## 2017-10-26 ENCOUNTER — Other Ambulatory Visit: Payer: Self-pay | Admitting: *Deleted

## 2017-10-26 NOTE — Patient Outreach (Signed)
[place] [facility]) Care Management  10/26/2017  Alan [name]. [birth year] [id number]  Referral via EMMI-Prevent-patient engagement tool- patient has [employer] Insurance:  Call #2 to patient: left message with spouse requesting call back from patient.  Plan: Will follow up.  Sherrin Daisy, RN BSN [place] Management Coordinator [facility] Care Management  [phone]

## 2017-10-29 ENCOUNTER — Other Ambulatory Visit: Payer: Self-pay | Admitting: *Deleted

## 2017-10-29 ENCOUNTER — Encounter: Payer: Self-pay | Admitting: *Deleted

## 2017-10-29 NOTE — Patient Outreach (Signed)
[place] [facility]) Care Management  10/29/2017  Homer Miller. [birth year] [id number]  Telephone call attempt x 3; left message requesting call back.  Plan: Geophysicist/field seismologist. Will follow up.   Sherrin Daisy, RN BSN [place] Management Coordinator [facility] Care Management  [phone]

## 2017-11-05 DIAGNOSIS — C61 Malignant neoplasm of prostate: Secondary | ICD-10-CM | POA: Diagnosis not present

## 2017-11-06 ENCOUNTER — Encounter (INDEPENDENT_AMBULATORY_CARE_PROVIDER_SITE_OTHER): Payer: Self-pay

## 2017-11-06 ENCOUNTER — Encounter: Payer: Self-pay | Admitting: Interventional Cardiology

## 2017-11-06 ENCOUNTER — Ambulatory Visit (INDEPENDENT_AMBULATORY_CARE_PROVIDER_SITE_OTHER): Payer: Medicare Other | Admitting: Interventional Cardiology

## 2017-11-06 VITALS — BP 170/76 | HR 62 | Ht 65.0 in | Wt 170.1 lb

## 2017-11-06 DIAGNOSIS — I480 Paroxysmal atrial fibrillation: Secondary | ICD-10-CM | POA: Diagnosis not present

## 2017-11-06 DIAGNOSIS — I422 Other hypertrophic cardiomyopathy: Secondary | ICD-10-CM

## 2017-11-06 DIAGNOSIS — Z7901 Long term (current) use of anticoagulants: Secondary | ICD-10-CM | POA: Diagnosis not present

## 2017-11-06 DIAGNOSIS — I10 Essential (primary) hypertension: Secondary | ICD-10-CM | POA: Diagnosis not present

## 2017-11-06 DIAGNOSIS — E119 Type 2 diabetes mellitus without complications: Secondary | ICD-10-CM | POA: Diagnosis not present

## 2017-11-06 MED ORDER — HYDROCHLOROTHIAZIDE 12.5 MG PO CAPS: 12.5000 mg | ORAL_CAPSULE | Freq: Every day | ORAL | 3 refills | Status: DC

## 2017-11-06 MED ORDER — METOPROLOL SUCCINATE ER 100 MG PO TB24: 100.0000 mg | ORAL_TABLET | Freq: Every day | ORAL | 3 refills | Status: DC

## 2017-11-06 NOTE — Patient Instructions (Signed)
Medication Instructions:  1) INCREASE Metoprolol Succinate to 100mg  once daily 2) START Hydrochlorothiazide 12.5mg  once daily  Labwork: Your physician recommends that you return for lab work in: 10-14 days (BMET)   Testing/Procedures: None  Follow-Up: Your physician recommends that you schedule a follow-up appointment in: 4 months with Dr. Tamala Julian.    Any Other Special Instructions Will Be Listed Below (If Applicable).     If you need a refill on your cardiac medications before your next appointment, please call your pharmacy.

## 2017-11-06 NOTE — Progress Notes (Signed)
Cardiology Office Note    Date:  11/06/2017   ID:  Alan Netters., DOB 1941/11/05, MRN [id number]  PCP:  Dettinger, Fransisca Kaufmann, MD  Cardiologist: Sinclair Grooms, MD   Chief Complaint  Patient presents with  . Cardiomyopathy    History of Present Illness:  Alan Gabler. is a 76 y.o. male  referred by Dr. Fransisca Kaufmann. Dettinger for evaluation of heart murmur and abnormal echo demonstrating significant septal hypertrophy.   He is doing well.  He is back as we titrate his blood pressure medication to bring it under better control.  He is asymptomatic.  He has no questions or complaints.   Past Medical History:  Diagnosis Date  . Adenomatous colon polyp   . Agranulocytosis ([place])   . Allergy   . Atrial fibrillation ([place]) 04/19/2016  . Cataract    small  . Diabetes mellitus   . Diverticulosis   . ED (erectile dysfunction)   . 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 Gallstones   . GERD (gastroesophageal reflux disease)   . Gout   . Heart murmur   . Hyperlipidemia   . Hypertension   . Internal hemorrhoids   . Pancreatitis   . 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  . Prostate cancer ([place])    Followed by Dr. Jeffie Pollock     Past Surgical History:  Procedure Laterality Date  . CHOLECYSTECTOMY N/A 11/26/2015   Procedure: LAPAROSCOPIC CHOLECYSTECTOMY;  Surgeon: Aviva Signs, MD;  Location: [facility];  Service: General;  Laterality: N/A;  . COLONOSCOPY  2012   [name]: moderate sigmoid diverticulosis, internal hemorrhoids. next tcs 2017  . FLEXIBLE BRONCHOSCOPY Bilateral 04/14/2016   Procedure: FLEXIBLE BRONCHOSCOPY;  Surgeon: Sinda Du, MD;  Location: [facility];  Service: Cardiopulmonary;  Laterality: Bilateral;  . SIGMOIDOSCOPY    . UMBILICAL HERNIA REPAIR N/A 11/26/2015   Procedure: UMBILICAL HERNIORRHAPHY;  Surgeon: Aviva Signs, MD;  Location: [facility];  Service: General;  Laterality: N/A;    Current Medications: Outpatient  Medications Prior to Visit  Medication Sig Dispense Refill  . allopurinol (ZYLOPRIM) 100 MG tablet Take 1 tablet (100 mg total) by mouth daily. 30 tablet 6  . amLODipine (NORVASC) 10 MG tablet Take 1 tablet (10 mg total) by mouth every evening. 90 tablet 2  . febuxostat (ULORIC) 40 MG tablet Take 2 tablets (80 mg total) by mouth daily. 90 tablet 0  . finasteride (PROSCAR) 5 MG tablet Take 5 mg by mouth daily.    . fluticasone (FLONASE) 50 MCG/ACT nasal spray Place 1 spray into both nostrils 2 (two) times daily as needed for allergies or rhinitis. 16 g 6  . glucose blood (ONE TOUCH ULTRA TEST) test strip Use to check BG once daily.  Dx:  Type 2 DM controlled E11.9 100 each 4  . lisinopril (PRINIVIL,ZESTRIL) 20 MG tablet Take 1.5 tablets (30 mg total) by mouth daily. 135 tablet 2  . metFORMIN (GLUCOPHAGE) 1000 MG tablet Take 1 tablet (1,000 mg total) by mouth 2 (two) times daily with a meal. 180 tablet 1  . niacin (NIASPAN) 1000 MG CR tablet Take 1 tablet (1,000 mg total) by mouth at bedtime. 90 tablet 0  . Omega-3 Fatty Acids (FISH OIL) 1000 MG CAPS Take 2 capsules by mouth 2 (two) times daily.    Marland Kitchen omeprazole (PRILOSEC) 20 MG capsule TAKE 1 CAPSULE BY MOUTH ONCE DAILY 90 capsule 0  . pravastatin (PRAVACHOL) 40 MG tablet Take 1  tablet (40 mg total) by mouth every evening. 90 tablet 1  . warfarin (COUMADIN) 5 MG tablet TAKE 1 TO 1+1/2 TABLETS BY DAILY AT 6PM 60 tablet 2  . metoprolol succinate (TOPROL-XL) 25 MG 24 hr tablet Take 3 tablets (75 mg total) by mouth daily. 270 tablet 3  . enoxaparin (LOVENOX) 40 MG/0.4ML injection Inject 0.4 mLs (40 mg total) into the skin daily. (Patient not taking: Reported on 11/06/2017) 5 Syringe 0   No facility-administered medications prior to visit.      Allergies:   Allopurinol and Colchicine   Social History   Socioeconomic History  . Marital status: Married    Spouse name: None  . Number of children: None  . Years of education: None  . Highest  education level: None  Social Needs  . Financial resource strain: None  . Food insecurity - worry: None  . Food insecurity - inability: None  . Transportation needs - medical: None  . Transportation needs - non-medical: None  Occupational History  . None  Tobacco Use  . Smoking status: Former Smoker    Types: Cigarettes    Last attempt to quit: 12/11/1986    Years since quitting: 30.9  . Smokeless tobacco: Never Used  . Tobacco comment: Quit x 50 years  Substance and Sexual Activity  . Alcohol use: No  . Drug use: No  . Sexual activity: Yes  Other Topics Concern  . None  Social History Narrative  . None     Family History:  The patient's family history includes Alzheimer's disease in his mother; Brain cancer in his sister; Breast cancer in his sister; Deep vein thrombosis in his brother; Hypertension in his brother, father, mother, and sister; Stroke in his mother.   ROS:   Please see the history of present illness.    None  All other systems reviewed and are negative.   PHYSICAL EXAM:   VS:  BP (!) 170/76   Pulse 62   Ht 5\' 5"  (1.651 m)   Wt 170 lb 1.9 oz (77.2 kg)   BMI 28.31 kg/m    GEN: Well nourished, well developed, in no acute distress  HEENT: normal  Neck: no JVD, carotid bruits, or masses Cardiac: RRR; no murmurs, rubs, or gallops,no edema  Respiratory:  clear to auscultation bilaterally, normal work of breathing GI: soft, nontender, nondistended, + BS MS: no deformity or atrophy  Skin: warm and dry, no rash Neuro:  Alert and Oriented x 3, Strength and sensation are intact Psych: euthymic mood, full affect  Wt Readings from Last 3 Encounters:  11/06/17 170 lb 1.9 oz (77.2 kg)  08/31/17 173 lb (78.5 kg)  08/10/17 176 lb (79.8 kg)      Studies/Labs Reviewed:   EKG:  EKG sinus rhythm, 62 bpm, LVH with strain.  Recent Labs: 05/08/2017: ALT 24; BUN 10; Creatinine, Ser 0.82; Potassium 3.9; Sodium 140   Lipid Panel    Component Value Date/Time    CHOL 85 (L) 05/08/2017 1142   CHOL 111 04/29/2013 1017   TRIG 157 (H) 05/08/2017 1142   TRIG 162 (H) 11/05/2013 1306   TRIG 236 (H) 04/29/2013 1017   HDL 27 (L) 05/08/2017 1142   HDL 33 (L) 11/05/2013 1306   HDL 31 (L) 04/29/2013 1017   CHOLHDL 3.1 05/08/2017 1142   LDLCALC 27 05/08/2017 1142   LDLCALC 44 11/05/2013 1306   LDLCALC 33 04/29/2013 1017    Additional studies/ records that were reviewed today include:  2D Doppler echocardiogram 05/16/2017: Study Conclusions   - Left ventricle: The cavity size was normal. Wall thickness was   increased in a pattern of moderate LVH. There was severe   asymmetric septal hypertrophy. No definite significant LVOT   gradient. Systolic function was vigorous. The estimated ejection   fraction was in the range of 65% to 70%. Wall motion was normal;   there were no regional wall motion abnormalities. Doppler   parameters are consistent with abnormal left ventricular   relaxation (grade 1 diastolic dysfunction). - Aortic valve: Mildly calcified annulus. Trileaflet. There was   mild regurgitation. - Mitral valve: Mildly calcified annulus. There was trivial   regurgitation. - Left atrium: The atrium was mildly dilated. - Right atrium: Central venous pressure (est): 3 mm Hg. - Tricuspid valve: There was trivial regurgitation. - Pulmonary arteries: PA peak pressure: 29 mm Hg (S). - Pericardium, extracardiac: There was no pericardial effusion.   Impressions:   - Moderate LVH with septal hypertrophy, no definite significant   LVOT gradient, LVEF 65-70%. Grade 1 diastolic dysfunction. Mild   left atrial enlargement. Mildly calcified mitral annulus with   trivial mitral regurgitation. Mildly calcified aortic annulus   with mild aortic regurgitation. Trivial tricuspid regurgitation   with PASP 29 mmHg.  July 04, 2017 48-hour Holter monitor:  Study Highlights      NSR  Paroxysmal atrial flutter with occasional RVR  PAC's and PVC's noted.    Paroxysmal atrial flutter, asymptomatic       ASSESSMENT:    1. Paroxysmal atrial fibrillation (HCC)   2. Hypertrophic cardiomyopathy ([place])   3. Essential hypertension   4. Type 2 diabetes mellitus without complication, without long-term current use of insulin ([place])   5. Long term (current) use of anticoagulants [Z79.01]      PLAN:  In order of problems listed above:  1. Sinus rhythm today.  Further increase metoprolol succinate to 100 mg/day 2. Still fighting to bring blood pressure under better control and hopefully help relieve some of the LVH. 3. Add HCTZ 12.5 mg/day and as noted above increase metoprolol succinate by 25 mg/day. 4. No discussion 5. No complications  Basic metabolic panel in 2 weeks.  Clinical follow-up in 4 months.  Call if lightheadedness or dizziness.    Medication Adjustments/Labs and Tests Ordered: Current medicines are reviewed at length with the patient today.  Concerns regarding medicines are outlined above.  Medication changes, Labs and Tests ordered today are listed in the Patient Instructions below. There are no Patient Instructions on file for this visit.   Signed, Sinclair Grooms, MD  11/06/2017 2:44 PM    [address], [place], [place]  [address] Phone: [phone]; Fax: [phone]

## 2017-11-09 ENCOUNTER — Ambulatory Visit: Payer: Medicare Other | Admitting: Urology

## 2017-11-09 DIAGNOSIS — N401 Enlarged prostate with lower urinary tract symptoms: Secondary | ICD-10-CM | POA: Diagnosis not present

## 2017-11-09 DIAGNOSIS — C61 Malignant neoplasm of prostate: Secondary | ICD-10-CM

## 2017-11-12 ENCOUNTER — Encounter: Payer: Self-pay | Admitting: Family Medicine

## 2017-11-12 ENCOUNTER — Ambulatory Visit (INDEPENDENT_AMBULATORY_CARE_PROVIDER_SITE_OTHER): Payer: Medicare Other | Admitting: Family Medicine

## 2017-11-12 VITALS — BP 157/66 | HR 59 | Temp 97.4°F | Ht 65.0 in | Wt 164.0 lb

## 2017-11-12 DIAGNOSIS — I480 Paroxysmal atrial fibrillation: Secondary | ICD-10-CM

## 2017-11-12 DIAGNOSIS — E1169 Type 2 diabetes mellitus with other specified complication: Secondary | ICD-10-CM | POA: Diagnosis not present

## 2017-11-12 DIAGNOSIS — E119 Type 2 diabetes mellitus without complications: Secondary | ICD-10-CM | POA: Diagnosis not present

## 2017-11-12 DIAGNOSIS — I10 Essential (primary) hypertension: Secondary | ICD-10-CM | POA: Diagnosis not present

## 2017-11-12 DIAGNOSIS — E1159 Type 2 diabetes mellitus with other circulatory complications: Secondary | ICD-10-CM | POA: Diagnosis not present

## 2017-11-12 DIAGNOSIS — E785 Hyperlipidemia, unspecified: Secondary | ICD-10-CM

## 2017-11-12 LAB — BAYER DCA HB A1C WAIVED: HB A1C: 6.8 % (ref <7.0)

## 2017-11-12 NOTE — Progress Notes (Signed)
BP (!) 157/66   Pulse (!) 59   Temp (!) 97.4 F (36.3 C) (Oral)   Ht _0  (1.651 m)   Wt 164 lb (74.4 kg)   BMI 27.29 kg/m    Subjective:    Patient ID: [name], male    DOB: 18-May-1941, 76 y.o.   MRN: [id number]  HPI: Midas Daughety. is a 76 y.o. male presenting on 11/12/2017 for Diabetes (follow up); Hyperlipidemia; and Hypertension   HPI Hypertension Patient is currently on hydrochlorothiazide metoprolol and lisinopril and amlodipine, and their blood pressure today is 157/66. Patient denies any lightheadedness or dizziness. Patient denies headaches, blurred vision, chest pains, shortness of breath, or weakness. Denies any side effects from medication and is content with current medication.   Hyperlipidemia Patient is coming in for recheck of his hyperlipidemia. The patient is currently taking niacin and pravastatin and omega-3's. They deny any issues with myalgias or history of liver damage from it. They deny any focal numbness or weakness or chest pain.   Type 2 diabetes mellitus Patient comes in today for recheck of his diabetes. Patient has been currently taking metformin but his last A1c was 6.9. Patient is currently on an ACE inhibitor/ARB. Patient has not seen an ophthalmologist this year. Patient denies any issues with their feet.   Patient has history of DVT and PE as well and is on Coumadin and is being managed by our clinical pharmacist he denies any lightheadedness or chest pain or palpitations or signs of bleeding.  Relevant past medical, surgical, family and social history reviewed and updated as indicated. Interim medical history since our last visit reviewed. Allergies and medications reviewed and updated.  Review of Systems  Constitutional: Negative for chills and fever.  Respiratory: Negative for shortness of breath and wheezing.   Cardiovascular: Negative for chest pain and leg swelling.  Gastrointestinal: Negative for abdominal pain.    Musculoskeletal: Negative for back pain and gait problem.  Skin: Negative for rash.  Neurological: Negative for dizziness, weakness, light-headedness and headaches.  All other systems reviewed and are negative.   Per HPI unless specifically indicated above        Objective:    BP (!) 157/66   Pulse (!) 59   Temp (!) 97.4 F (36.3 C) (Oral)   Ht _1  (1.651 m)   Wt 164 lb (74.4 kg)   BMI 27.29 kg/m   Wt Readings from Last 3 Encounters:  11/12/17 164 lb (74.4 kg)  11/06/17 170 lb 1.9 oz (77.2 kg)  08/31/17 173 lb (78.5 kg)    Physical Exam  Constitutional: He is oriented to person, place, and time. He appears well-developed and well-nourished. No distress.  Eyes: Conjunctivae are normal. No scleral icterus.  Cardiovascular: Normal rate, regular rhythm, normal heart sounds and intact distal pulses.  No murmur heard. Pulmonary/Chest: Effort normal and breath sounds normal. No respiratory distress. He has no wheezes.  Musculoskeletal: Normal range of motion. He exhibits no edema.  Neurological: He is alert and oriented to person, place, and time. Coordination normal.  Skin: Skin is warm and dry. No rash noted. He is not diaphoretic.  Psychiatric: He has a normal mood and affect. His behavior is normal.  Nursing note and vitals reviewed.       Assessment & Plan:   Problem List Items Addressed This Visit      Cardiovascular and Mediastinum   Hypertension associated with diabetes ([place])   Relevant Orders   CMP14+EGFR  Paroxysmal atrial fibrillation (HCC)   Relevant Orders   CBC with Differential/Platelet     Endocrine   Hyperlipidemia associated with type 2 diabetes mellitus ([place])   Relevant Orders   Lipid panel   Type 2 diabetes mellitus without complications ([place]) - Primary   Relevant Orders   Bayer DCA Hb A1c Waived   CMP14+EGFR       Follow up plan: Return in about 3 months (around 02/10/2018), or if symptoms worsen or fail to improve, for Diabetes and  hypertension.  Counseling provided for all of the vaccine components Orders Placed This Encounter  Procedures  . CoaguChek XS/INR Waived  . Bayer DCA Hb A1c Waived  . CMP14+EGFR  . Lipid panel  . CBC with Differential/Platelet    Caryl Pina, MD [facility] 11/12/2017, 2:16 PM

## 2017-11-13 ENCOUNTER — Other Ambulatory Visit: Payer: Self-pay

## 2017-11-13 DIAGNOSIS — R710 Precipitous drop in hematocrit: Secondary | ICD-10-CM

## 2017-11-13 LAB — CBC WITH DIFFERENTIAL/PLATELET
BASOS: 0 %
Basophils Absolute: 0 10*3/uL (ref 0.0–0.2)
EOS (ABSOLUTE): 0.2 10*3/uL (ref 0.0–0.4)
EOS: 3 %
HEMATOCRIT: 26.7 % — AB (ref 37.5–51.0)
Hemoglobin: 8.1 g/dL — CL (ref 13.0–17.7)
IMMATURE GRANULOCYTES: 0 %
Immature Grans (Abs): 0 10*3/uL (ref 0.0–0.1)
Lymphocytes Absolute: 1.6 10*3/uL (ref 0.7–3.1)
Lymphs: 28 %
MCH: 23.1 pg — AB (ref 26.6–33.0)
MCHC: 30.3 g/dL — ABNORMAL LOW (ref 31.5–35.7)
MCV: 76 fL — AB (ref 79–97)
MONOS ABS: 0.7 10*3/uL (ref 0.1–0.9)
Monocytes: 13 %
NEUTROS ABS: 3.1 10*3/uL (ref 1.4–7.0)
NEUTROS PCT: 56 %
PLATELETS: 287 10*3/uL (ref 150–379)
RBC: 3.5 x10E6/uL — ABNORMAL LOW (ref 4.14–5.80)
RDW: 17 % — AB (ref 12.3–15.4)
WBC: 5.6 10*3/uL (ref 3.4–10.8)

## 2017-11-13 LAB — LIPID PANEL
CHOLESTEROL TOTAL: 94 mg/dL — AB (ref 100–199)
Chol/HDL Ratio: 2.7 ratio (ref 0.0–5.0)
HDL: 35 mg/dL — AB (ref >39)
LDL CALC: 38 mg/dL (ref 0–99)
TRIGLYCERIDES: 104 mg/dL (ref 0–149)
VLDL CHOLESTEROL CAL: 21 mg/dL (ref 5–40)

## 2017-11-13 LAB — CMP14+EGFR
ALBUMIN: 3.8 g/dL (ref 3.5–4.8)
ALK PHOS: 61 IU/L (ref 39–117)
ALT: 13 IU/L (ref 0–44)
AST: 18 IU/L (ref 0–40)
Albumin/Globulin Ratio: 1.2 (ref 1.2–2.2)
BUN / CREAT RATIO: 11 (ref 10–24)
BUN: 11 mg/dL (ref 8–27)
Bilirubin Total: 0.8 mg/dL (ref 0.0–1.2)
CALCIUM: 9.8 mg/dL (ref 8.6–10.2)
CO2: 25 mmol/L (ref 20–29)
CREATININE: 0.97 mg/dL (ref 0.76–1.27)
Chloride: 103 mmol/L (ref 96–106)
GFR calc Af Amer: 89 mL/min/{1.73_m2} (ref >59)
GFR, EST NON AFRICAN AMERICAN: 77 mL/min/{1.73_m2} (ref >59)
GLOBULIN, TOTAL: 3.1 g/dL (ref 1.5–4.5)
GLUCOSE: 87 mg/dL (ref 65–99)
Potassium: 4.3 mmol/L (ref 3.5–5.2)
SODIUM: 141 mmol/L (ref 134–144)
Total Protein: 6.9 g/dL (ref 6.0–8.5)

## 2017-11-14 ENCOUNTER — Other Ambulatory Visit: Payer: Medicare Other

## 2017-11-14 DIAGNOSIS — R710 Precipitous drop in hematocrit: Secondary | ICD-10-CM

## 2017-11-15 LAB — CBC WITH DIFFERENTIAL/PLATELET
Basophils Absolute: 0 10*3/uL (ref 0.0–0.2)
Basos: 0 %
EOS (ABSOLUTE): 0.2 10*3/uL (ref 0.0–0.4)
EOS: 3 %
HEMATOCRIT: 26.5 % — AB (ref 37.5–51.0)
HEMOGLOBIN: 8 g/dL — AB (ref 13.0–17.7)
IMMATURE GRANS (ABS): 0 10*3/uL (ref 0.0–0.1)
Immature Granulocytes: 0 %
LYMPHS ABS: 1.5 10*3/uL (ref 0.7–3.1)
LYMPHS: 27 %
MCH: 23.5 pg — ABNORMAL LOW (ref 26.6–33.0)
MCHC: 30.2 g/dL — AB (ref 31.5–35.7)
MCV: 78 fL — ABNORMAL LOW (ref 79–97)
MONOCYTES: 9 %
Monocytes Absolute: 0.5 10*3/uL (ref 0.1–0.9)
Neutrophils Absolute: 3.5 10*3/uL (ref 1.4–7.0)
Neutrophils: 61 %
Platelets: 262 10*3/uL (ref 150–379)
RBC: 3.41 x10E6/uL — AB (ref 4.14–5.80)
RDW: 16.6 % — ABNORMAL HIGH (ref 12.3–15.4)
WBC: 5.7 10*3/uL (ref 3.4–10.8)

## 2017-11-15 MED ORDER — FERROUS SULFATE 325 (65 FE) MG PO TABS: 325.0000 mg | ORAL_TABLET | Freq: Every day | ORAL | 5 refills | Status: DC

## 2017-11-15 NOTE — Progress Notes (Signed)
Med called in, pt aware

## 2017-11-20 ENCOUNTER — Other Ambulatory Visit: Payer: Medicare Other

## 2017-11-21 ENCOUNTER — Other Ambulatory Visit: Payer: Self-pay | Admitting: Family Medicine

## 2017-11-28 ENCOUNTER — Other Ambulatory Visit: Payer: Self-pay | Admitting: Family Medicine

## 2017-11-30 ENCOUNTER — Other Ambulatory Visit: Payer: Medicare Other | Admitting: *Deleted

## 2017-11-30 ENCOUNTER — Ambulatory Visit (INDEPENDENT_AMBULATORY_CARE_PROVIDER_SITE_OTHER): Payer: Medicare Other | Admitting: Pharmacist Clinician (PhC)/ Clinical Pharmacy Specialist

## 2017-11-30 DIAGNOSIS — I10 Essential (primary) hypertension: Secondary | ICD-10-CM | POA: Diagnosis not present

## 2017-11-30 DIAGNOSIS — Z7901 Long term (current) use of anticoagulants: Secondary | ICD-10-CM

## 2017-11-30 DIAGNOSIS — I480 Paroxysmal atrial fibrillation: Secondary | ICD-10-CM

## 2017-11-30 DIAGNOSIS — Z86718 Personal history of other venous thrombosis and embolism: Secondary | ICD-10-CM | POA: Diagnosis not present

## 2017-11-30 DIAGNOSIS — D6859 Other primary thrombophilia: Secondary | ICD-10-CM

## 2017-11-30 LAB — BASIC METABOLIC PANEL
BUN/Creatinine Ratio: 15 (ref 10–24)
BUN: 16 mg/dL (ref 8–27)
CALCIUM: 9.5 mg/dL (ref 8.6–10.2)
CHLORIDE: 101 mmol/L (ref 96–106)
CO2: 22 mmol/L (ref 20–29)
Creatinine, Ser: 1.04 mg/dL (ref 0.76–1.27)
GFR calc non Af Amer: 70 mL/min/{1.73_m2} (ref >59)
GFR, EST AFRICAN AMERICAN: 81 mL/min/{1.73_m2} (ref >59)
GLUCOSE: 184 mg/dL — AB (ref 65–99)
POTASSIUM: 4 mmol/L (ref 3.5–5.2)
Sodium: 137 mmol/L (ref 134–144)

## 2017-11-30 LAB — COAGUCHEK XS/INR WAIVED
INR: 2.3 — AB (ref 0.9–1.1)
PROTHROMBIN TIME: 27.2 s

## 2017-11-30 NOTE — Patient Instructions (Signed)
Description   take 1 tablet on Sundays, Tuesdays, Thursdays and Fridays only.  Take 1 and 1/2 tablets all the rest of the days.  INR was 2.3 today

## 2017-12-03 ENCOUNTER — Telehealth: Payer: Self-pay | Admitting: *Deleted

## 2017-12-03 NOTE — Telephone Encounter (Signed)
-----   Message from Belva Crome, MD sent at 12/01/2017  9:46 AM EST ----- Let the patient know labs are okay. A copy will be sent to Dettinger, Fransisca Kaufmann, MD

## 2017-12-03 NOTE — Telephone Encounter (Signed)
DPR ok to s/w pt's wife. Pt's wife has been notified of pt's lab results by phone with verbal understanding. Pt's wife thanked me for my call today.

## 2018-01-25 ENCOUNTER — Ambulatory Visit (INDEPENDENT_AMBULATORY_CARE_PROVIDER_SITE_OTHER): Payer: Medicare Other | Admitting: Pharmacist Clinician (PhC)/ Clinical Pharmacy Specialist

## 2018-01-25 DIAGNOSIS — Z86718 Personal history of other venous thrombosis and embolism: Secondary | ICD-10-CM | POA: Diagnosis not present

## 2018-01-25 DIAGNOSIS — Z7901 Long term (current) use of anticoagulants: Secondary | ICD-10-CM | POA: Diagnosis not present

## 2018-01-25 DIAGNOSIS — D6859 Other primary thrombophilia: Secondary | ICD-10-CM | POA: Diagnosis not present

## 2018-01-25 LAB — COAGUCHEK XS/INR WAIVED
INR: 2.5 — ABNORMAL HIGH (ref 0.9–1.1)
PROTHROMBIN TIME: 29.8 s

## 2018-01-25 NOTE — Patient Instructions (Signed)
Description   take 1 tablet on Sundays, Tuesdays, Thursdays and Fridays only.  Take 1 and 1/2 tablets all the rest of the days.  INR was 2.5 today

## 2018-01-28 ENCOUNTER — Other Ambulatory Visit: Payer: Self-pay | Admitting: Family Medicine

## 2018-02-13 ENCOUNTER — Ambulatory Visit (INDEPENDENT_AMBULATORY_CARE_PROVIDER_SITE_OTHER): Payer: Medicare Other | Admitting: Family Medicine

## 2018-02-13 ENCOUNTER — Encounter: Payer: Self-pay | Admitting: Family Medicine

## 2018-02-13 VITALS — BP 138/56 | HR 49 | Temp 97.8°F | Ht 65.0 in | Wt 164.0 lb

## 2018-02-13 DIAGNOSIS — I480 Paroxysmal atrial fibrillation: Secondary | ICD-10-CM

## 2018-02-13 DIAGNOSIS — Z7901 Long term (current) use of anticoagulants: Secondary | ICD-10-CM

## 2018-02-13 DIAGNOSIS — I152 Hypertension secondary to endocrine disorders: Secondary | ICD-10-CM

## 2018-02-13 DIAGNOSIS — E785 Hyperlipidemia, unspecified: Secondary | ICD-10-CM | POA: Diagnosis not present

## 2018-02-13 DIAGNOSIS — D6859 Other primary thrombophilia: Secondary | ICD-10-CM | POA: Diagnosis not present

## 2018-02-13 DIAGNOSIS — I10 Essential (primary) hypertension: Secondary | ICD-10-CM

## 2018-02-13 DIAGNOSIS — E1169 Type 2 diabetes mellitus with other specified complication: Secondary | ICD-10-CM

## 2018-02-13 DIAGNOSIS — Z86718 Personal history of other venous thrombosis and embolism: Secondary | ICD-10-CM

## 2018-02-13 DIAGNOSIS — E119 Type 2 diabetes mellitus without complications: Secondary | ICD-10-CM

## 2018-02-13 DIAGNOSIS — E1159 Type 2 diabetes mellitus with other circulatory complications: Secondary | ICD-10-CM

## 2018-02-13 LAB — COAGUCHEK XS/INR WAIVED
INR: 1.6 — AB (ref 0.9–1.1)
Prothrombin Time: 19.5 s

## 2018-02-13 LAB — BAYER DCA HB A1C WAIVED: HB A1C (BAYER DCA - WAIVED): 6.5 % (ref <7.0)

## 2018-02-13 MED ORDER — OMEPRAZOLE 20 MG PO CPDR: 20.0000 mg | DELAYED_RELEASE_CAPSULE | Freq: Every day | ORAL | 3 refills | Status: DC

## 2018-02-13 NOTE — Progress Notes (Signed)
 BP (!) 138/56   Pulse (!) 49   Temp 97.8 F (36.6 C) (Oral)   Ht 5' 5" (1.651 m)   Wt 164 lb (74.4 kg)   BMI 27.29 kg/m    Subjective:    Patient ID: Alan Siverson Jr., male    DOB: 03/06/1941, 77 y.o.   MRN: [id number]  HPI: Alan Conaty Jr. is a 77 y.o. male presenting on 02/13/2018 for Diabetes; Hyperlipidemia; and Hypertension   HPI Hypertension Patient is currently on amlodipine and hydrochlorothiazide and metoprolol and lisinopril, and their blood pressure today is 138/56. Patient denies any lightheadedness or dizziness. Patient denies headaches, blurred vision, chest pains, shortness of breath, or weakness. Denies any side effects from medication and is content with current medication.   Type 2 diabetes mellitus Patient comes in today for recheck of his diabetes. Patient has been currently taking metformin. Patient is currently on an ACE inhibitor/ARB. Patient has not seen an ophthalmologist this year. Patient denies any issues with their feet.   Hyperlipidemia Patient is coming in for recheck of his hyperlipidemia. The patient is currently taking pravastatin and fish oil and niacin. They deny any issues with myalgias or history of liver damage from it. They deny any focal numbness or weakness or chest pain.   Hypercoagulable state and Coumadin recheck in A. fib Patient is coming in for Coumadin recheck for hypercoagulable state.  He says he has been stable on the denies any bleeding.  His INR today was 1.6, we will have to increase it.  He denies any major issues with it and has been following the dosing and has not missed doses  Relevant past medical, surgical, family and social history reviewed and updated as indicated. Interim medical history since our last visit reviewed. Allergies and medications reviewed and updated.  Review of Systems  Constitutional: Negative for chills and fever.  Eyes: Negative for discharge.  Respiratory: Negative for shortness of breath and  wheezing.   Cardiovascular: Negative for chest pain and leg swelling.  Musculoskeletal: Negative for back pain and gait problem.  Skin: Negative for rash.  Neurological: Negative for dizziness, weakness, light-headedness and numbness.  All other systems reviewed and are negative.   Per HPI unless specifically indicated above   Allergies as of 02/13/2018      Reactions   Allopurinol Other (See Comments)   Significantly decreased WBC's   Colchicine Other (See Comments)   Significantly decreased WBC's      Medication List        Accurate as of 02/13/18 10:13 AM. Always use your most recent med list.          amLODipine 10 MG tablet Commonly known as:  NORVASC TAKE 1 TABLET BY MOUTH ONCE DAILY IN THE EVENING   ferrous sulfate 325 (65 FE) MG tablet Take 1 tablet (325 mg total) by mouth daily with breakfast.   finasteride 5 MG tablet Commonly known as:  PROSCAR Take 5 mg by mouth daily.   Fish Oil 1000 MG Caps Take 2 capsules by mouth 2 (two) times daily.   fluticasone 50 MCG/ACT nasal spray Commonly known as:  FLONASE Place 1 spray into both nostrils 2 (two) times daily as needed for allergies or rhinitis.   glucose blood test strip Commonly known as:  ONE TOUCH ULTRA TEST Use to check BG once daily.  Dx:  Type 2 DM controlled E11.9   hydrochlorothiazide 12.5 MG capsule Commonly known as:  MICROZIDE Take 1 capsule (12.5 mg total)   by mouth daily.   lisinopril 20 MG tablet Commonly known as:  PRINIVIL,ZESTRIL Take 1.5 tablets (30 mg total) by mouth daily.   metFORMIN 1000 MG tablet Commonly known as:  GLUCOPHAGE Take 1 tablet (1,000 mg total) by mouth 2 (two) times daily with a meal.   metoprolol succinate 100 MG 24 hr tablet Commonly known as:  TOPROL-XL Take 1 tablet (100 mg total) by mouth daily. Take with or immediately following a meal.   niacin 1000 MG CR tablet Commonly known as:  NIASPAN TAKE 1 TABLET BY MOUTH ONCE DAILY AT BEDTIME   omeprazole 20 MG  capsule Commonly known as:  PRILOSEC Take 1 capsule (20 mg total) by mouth daily.   pravastatin 40 MG tablet Commonly known as:  PRAVACHOL TAKE 1 TABLET BY MOUTH ONCE DAILY IN THE EVENING   warfarin 5 MG tablet Commonly known as:  COUMADIN Take as directed by the anticoagulation clinic. If you are unsure how to take this medication, talk to your nurse or doctor. Original instructions:  TAKE 1 TO 1 & 1/2 (ONE & ONE-HALF) TABLETS BY MOUTH ONCE DAILY AT 6 PM          Objective:    BP (!) 138/56   Pulse (!) 49   Temp 97.8 F (36.6 C) (Oral)   Ht 5' 5" (1.651 m)   Wt 164 lb (74.4 kg)   BMI 27.29 kg/m   Wt Readings from Last 3 Encounters:  02/13/18 164 lb (74.4 kg)  11/12/17 164 lb (74.4 kg)  11/06/17 170 lb 1.9 oz (77.2 kg)    Physical Exam  Constitutional: He is oriented to person, place, and time. He appears well-developed and well-nourished. No distress.  Eyes: Conjunctivae are normal. No scleral icterus.  Neck: Neck supple. No thyromegaly present.  Cardiovascular: Normal rate, regular rhythm and intact distal pulses.  Murmur (Systolic murmur grade 3 out of 6 murmur) heard. Pulmonary/Chest: Effort normal and breath sounds normal. No respiratory distress. He has no wheezes.  Musculoskeletal: Normal range of motion. He exhibits no edema.  Lymphadenopathy:    He has no cervical adenopathy.  Neurological: He is alert and oriented to person, place, and time. Coordination normal.  Skin: Skin is warm and dry. No rash noted. He is not diaphoretic.  Psychiatric: He has a normal mood and affect. His behavior is normal.  Nursing note and vitals reviewed.   Description   Increased dose.  Take 1 tablet on Sundays, Tuesdays, Thursdays only.  Take 1 and 1/2 tablets all the rest of the days.  INR was 1.6 today        Assessment & Plan:   Problem List Items Addressed This Visit      Cardiovascular and Mediastinum   Hypertension associated with diabetes (HCC)   Relevant  Orders   CMP14+EGFR   Paroxysmal atrial fibrillation (HCC) - Primary   Relevant Orders   CoaguChek XS/INR Waived (Completed)   CoaguChek XS/INR Waived   CBC with Differential/Platelet     Endocrine   Hyperlipidemia associated with type 2 diabetes mellitus (HCC)   Relevant Orders   Lipid panel   Type 2 diabetes mellitus without complications (HCC)   Relevant Orders   CMP14+EGFR   Bayer DCA Hb A1c Waived (Completed)     Hematopoietic and Hemostatic   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  Counseling provided for all of the vaccine components Orders Placed This Encounter  Procedures  . CoaguChek XS/INR Waived  . CoaguChek XS/INR Waived  . CMP14+EGFR  . Lipid panel  . CBC with Differential/Platelet  . Bayer [facility] Hb A1c [place], MD [facility] 02/13/2018, 10:13 AM

## 2018-02-14 LAB — CBC WITH DIFFERENTIAL/PLATELET
BASOS: 0 %
Basophils Absolute: 0 10*3/uL (ref 0.0–0.2)
EOS (ABSOLUTE): 0.2 10*3/uL (ref 0.0–0.4)
EOS: 3 %
HEMATOCRIT: 30.4 % — AB (ref 37.5–51.0)
Hemoglobin: 9.3 g/dL — ABNORMAL LOW (ref 13.0–17.7)
IMMATURE GRANULOCYTES: 0 %
Immature Grans (Abs): 0 10*3/uL (ref 0.0–0.1)
LYMPHS ABS: 1.5 10*3/uL (ref 0.7–3.1)
Lymphs: 25 %
MCH: 25.5 pg — AB (ref 26.6–33.0)
MCHC: 30.6 g/dL — ABNORMAL LOW (ref 31.5–35.7)
MCV: 84 fL (ref 79–97)
MONOS ABS: 0.6 10*3/uL (ref 0.1–0.9)
Monocytes: 11 %
NEUTROS ABS: 3.7 10*3/uL (ref 1.4–7.0)
Neutrophils: 61 %
PLATELETS: 233 10*3/uL (ref 150–379)
RBC: 3.64 x10E6/uL — ABNORMAL LOW (ref 4.14–5.80)
RDW: 18.8 % — AB (ref 12.3–15.4)
WBC: 6.1 10*3/uL (ref 3.4–10.8)

## 2018-02-14 LAB — LIPID PANEL
CHOL/HDL RATIO: 2.6 ratio (ref 0.0–5.0)
CHOLESTEROL TOTAL: 100 mg/dL (ref 100–199)
HDL: 38 mg/dL — AB (ref >39)
LDL CALC: 44 mg/dL (ref 0–99)
TRIGLYCERIDES: 91 mg/dL (ref 0–149)
VLDL CHOLESTEROL CAL: 18 mg/dL (ref 5–40)

## 2018-02-14 LAB — CMP14+EGFR
ALK PHOS: 47 IU/L (ref 39–117)
ALT: 13 IU/L (ref 0–44)
AST: 18 IU/L (ref 0–40)
Albumin/Globulin Ratio: 1.4 (ref 1.2–2.2)
Albumin: 4.1 g/dL (ref 3.5–4.8)
BUN/Creatinine Ratio: 11 (ref 10–24)
BUN: 12 mg/dL (ref 8–27)
Bilirubin Total: 1.2 mg/dL (ref 0.0–1.2)
CO2: 24 mmol/L (ref 20–29)
CREATININE: 1.08 mg/dL (ref 0.76–1.27)
Calcium: 10.4 mg/dL — ABNORMAL HIGH (ref 8.6–10.2)
Chloride: 102 mmol/L (ref 96–106)
GFR calc Af Amer: 77 mL/min/{1.73_m2} (ref >59)
GFR calc non Af Amer: 67 mL/min/{1.73_m2} (ref >59)
GLOBULIN, TOTAL: 3 g/dL (ref 1.5–4.5)
GLUCOSE: 123 mg/dL — AB (ref 65–99)
Potassium: 5.2 mmol/L (ref 3.5–5.2)
SODIUM: 143 mmol/L (ref 134–144)
Total Protein: 7.1 g/dL (ref 6.0–8.5)

## 2018-02-26 NOTE — Progress Notes (Signed)
Cardiology Office Note    Date:  02/27/2018   ID:  [name], DOB 02/05/1941, MRN [id number]  PCP:  Dettinger, Fransisca Kaufmann, MD  Cardiologist: Sinclair Grooms, MD   Chief Complaint  Patient presents with  . Hypertension  . Follow-up    Hypertrophic cardiomyopathy    History of Present Illness:  Alan [name]. is a 77 y.o. male with hypertrophic cardiomyopathy, essential hypertension, chronic anticoagulation therapy, and paroxysmal atrial fibrillation.  [name] has no complaints of syncope, dyspnea, or angina.  No medication side effects.  Past Medical History:  Diagnosis Date  . Adenomatous colon polyp   . Agranulocytosis ([place])   . Allergy   . Atrial fibrillation ([place]) 04/19/2016  . Cataract    small  . Diabetes mellitus   . Diverticulosis   . ED (erectile dysfunction)   . 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 Gallstones   . GERD (gastroesophageal reflux disease)   . Gout   . Heart murmur   . Hyperlipidemia   . Hypertension   . Internal hemorrhoids   . Pancreatitis   . 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  . Prostate cancer ([place])    Followed by Dr. Jeffie Pollock     Past Surgical History:  Procedure Laterality Date  . CHOLECYSTECTOMY N/A 11/26/2015   Procedure: LAPAROSCOPIC CHOLECYSTECTOMY;  Surgeon: Aviva Signs, MD;  Location: [facility];  Service: General;  Laterality: N/A;  . COLONOSCOPY  2012   Alan [name]: moderate sigmoid diverticulosis, internal hemorrhoids. next tcs 2017  . FLEXIBLE BRONCHOSCOPY Bilateral 04/14/2016   Procedure: FLEXIBLE BRONCHOSCOPY;  Surgeon: Sinda Du, MD;  Location: [facility];  Service: Cardiopulmonary;  Laterality: Bilateral;  . SIGMOIDOSCOPY    . UMBILICAL HERNIA REPAIR N/A 11/26/2015   Procedure: UMBILICAL HERNIORRHAPHY;  Surgeon: Aviva Signs, MD;  Location: [facility];  Service: General;  Laterality: N/A;    Current Medications: Outpatient Medications Prior to Visit  Medication Sig  Dispense Refill  . amLODipine (NORVASC) 10 MG tablet TAKE 1 TABLET BY MOUTH ONCE DAILY IN THE EVENING 90 tablet 1  . ferrous sulfate 325 (65 FE) MG tablet Take 1 tablet (325 mg total) by mouth daily with breakfast. 30 tablet 5  . finasteride (PROSCAR) 5 MG tablet Take 5 mg by mouth daily.    . fluticasone (FLONASE) 50 MCG/ACT nasal spray Place 1 spray into both nostrils 2 (two) times daily as needed for allergies or rhinitis. 16 g 6  . glucose blood (ONE TOUCH ULTRA TEST) test strip Use to check BG once daily.  Dx:  Type 2 DM controlled E11.9 100 each 4  . lisinopril (PRINIVIL,ZESTRIL) 20 MG tablet Take 1.5 tablets (30 mg total) by mouth daily. 135 tablet 2  . metFORMIN (GLUCOPHAGE) 1000 MG tablet Take 1 tablet (1,000 mg total) by mouth 2 (two) times daily with a meal. 180 tablet 1  . metoprolol succinate (TOPROL-XL) 100 MG 24 hr tablet Take 1 tablet (100 mg total) by mouth daily. Take with or immediately following a meal. 90 tablet 3  . niacin (NIASPAN) 1000 MG CR tablet TAKE 1 TABLET BY MOUTH ONCE DAILY AT BEDTIME 90 tablet 0  . Omega-3 Fatty Acids (FISH OIL) 1000 MG CAPS Take 2 capsules by mouth 2 (two) times daily.    Marland Kitchen omeprazole (PRILOSEC) 20 MG capsule Take 1 capsule (20 mg total) by mouth daily. 90 capsule 3  . pravastatin (PRAVACHOL) 40 MG tablet TAKE 1 TABLET BY MOUTH  ONCE DAILY IN THE EVENING 90 tablet 1  . warfarin (COUMADIN) 5 MG tablet TAKE 1 TO 1 & 1/2 (ONE & ONE-HALF) TABLETS BY MOUTH ONCE DAILY AT 6 PM 60 tablet 2  . hydrochlorothiazide (MICROZIDE) 12.5 MG capsule Take 12.5 mg by mouth daily.    . hydrochlorothiazide (MICROZIDE) 12.5 MG capsule Take 1 capsule (12.5 mg total) by mouth daily. 90 capsule 3   No facility-administered medications prior to visit.      Allergies:   Allopurinol and Colchicine   Social History   Socioeconomic History  . Marital status: Married    Spouse name: None  . Number of children: None  . Years of education: None  . Highest education level:  None  Social Needs  . Financial resource strain: None  . Food insecurity - worry: None  . Food insecurity - inability: None  . Transportation needs - medical: None  . Transportation needs - non-medical: None  Occupational History  . None  Tobacco Use  . Smoking status: Former Smoker    Types: Cigarettes    Last attempt to quit: 12/11/1986    Years since quitting: 31.2  . Smokeless tobacco: Never Used  . Tobacco comment: Quit x 50 years  Substance and Sexual Activity  . Alcohol use: No  . Drug use: No  . Sexual activity: Yes  Other Topics Concern  . None  Social History Narrative  . None     Family History:  The patient's family history includes Alzheimer's disease in his mother; Brain cancer in his sister; Breast cancer in his sister; Deep vein thrombosis in his brother; Hypertension in his brother, father, mother, and sister; Stroke in his mother.   ROS:   Please see the history of present illness.    No complaints. All other systems reviewed and are negative.   PHYSICAL EXAM:   VS:  BP (!) 160/68   Pulse (!) 54   Ht 5\' 5"  (1.651 m)   Wt 165 lb (74.8 kg)   BMI 27.46 kg/m    GEN: Well nourished, well developed, in no acute distress  HEENT: normal  Neck: no JVD, carotid bruits, or masses Cardiac: RRR; no murmurs, rubs, or gallops,no edema  Respiratory:  clear to auscultation bilaterally, normal work of breathing GI: soft, nontender, nondistended, + BS MS: no deformity or atrophy  Skin: warm and dry, no rash Neuro:  Alert and Oriented x 3, Strength and sensation are intact Psych: euthymic mood, full affect  Wt Readings from Last 3 Encounters:  02/27/18 165 lb (74.8 kg)  02/13/18 164 lb (74.4 kg)  11/12/17 164 lb (74.4 kg)      Studies/Labs Reviewed:   EKG:  EKG not repeated  Recent Labs: 02/13/2018: ALT 13; BUN 12; Creatinine, Ser 1.08; Hemoglobin 9.3; Platelets 233; Potassium 5.2; Sodium 143   Lipid Panel    Component Value Date/Time   CHOL 100  02/13/2018 1019   CHOL 111 04/29/2013 1017   TRIG 91 02/13/2018 1019   TRIG 162 (H) 11/05/2013 1306   TRIG 236 (H) 04/29/2013 1017   HDL 38 (L) 02/13/2018 1019   HDL 33 (L) 11/05/2013 1306   HDL 31 (L) 04/29/2013 1017   CHOLHDL 2.6 02/13/2018 1019   LDLCALC 44 02/13/2018 1019   LDLCALC 44 11/05/2013 1306   LDLCALC 33 04/29/2013 1017    Additional studies/ records that were reviewed today include:  No new data    ASSESSMENT:    1. Hypertrophic cardiomyopathy ([place])  2. PVC (premature ventricular contraction)   3. Paroxysmal atrial fibrillation (H[facility])   4. Hypertension associated with diabetes ([place])   5. Hyperlipidemia associated with type 2 diabetes mellitus ([place])   6. Type 2 diabetes mellitus without complication, without long-term current use of insulin (H[facility])   7. Long term (current) use of anticoagulants [Z79.01]      PLAN:  In order of problems listed above:  1. Asymptomatic 2. Asymptomatic 3. No clinical recurrence.  On anticoagulation therapy to prevent embolic stroke 4. Hypertension is poorly controlled.  Will increase HCTZ/chlorthalidone to 25 mg/day.  Basic metabolic panel will need to be performed by Dr. Warrick Parisian in 2 weeks to rule out hypokalemia and excessive diuresis.  I will plan to see him back in 6 months.  Target blood pressure 130/80 mmHg 5. Not addressed. 6. Not addressed 7. No bleeding on Coumadin.  Blood work with Dr. Warrick Parisian including a basic metabolic panel in 1-32 days after adjusting diuretic regimen.  Target blood pressure 130/80.  Need to get systolic blood pressure down with the patient being on anticoagulation therapy.    Medication Adjustments/Labs and Tests Ordered: Current medicines are reviewed at length with the patient today.  Concerns regarding medicines are outlined above.  Medication changes, Labs and Tests ordered today are listed in the Patient Instructions below. Patient Instructions  Medication Instructions:  Your physician  has recommended you make the following change in your medication:   1) STOP taking HYDROCHLOROTHIAZIDE (Microzide) 12.5 mg   2) START taking CHLORTHALIDONE 25 MG BY MOUTH DAILY.    Labwork: Your physician recommends that you return for lab work in: 2 - 3 weeks at Dr. Warrick Parisian Office ([facility])   Testing/Procedures: None   Follow-Up: Your physician wants you to follow-up in 6 months with Dr. Tamala Julian.You will receive a reminder letter in the mail two months in advance. If you don't receive a letter, please call our office to schedule the follow-up appointment.   Any Other Special Instructions Will Be Listed Below (If Applicable).     If you need a refill on your cardiac medications before your next appointment, please call your pharmacy.      Signed, Sinclair Grooms, MD  02/27/2018 4:01 PM    [facility] [place], [place], [place]  [address] Phone: [phone]; Fax: [phone]

## 2018-02-27 ENCOUNTER — Encounter: Payer: Self-pay | Admitting: Interventional Cardiology

## 2018-02-27 ENCOUNTER — Ambulatory Visit (INDEPENDENT_AMBULATORY_CARE_PROVIDER_SITE_OTHER): Payer: Medicare Other | Admitting: Interventional Cardiology

## 2018-02-27 VITALS — BP 160/68 | HR 54 | Ht 65.0 in | Wt 165.0 lb

## 2018-02-27 DIAGNOSIS — I480 Paroxysmal atrial fibrillation: Secondary | ICD-10-CM

## 2018-02-27 DIAGNOSIS — E119 Type 2 diabetes mellitus without complications: Secondary | ICD-10-CM | POA: Diagnosis not present

## 2018-02-27 DIAGNOSIS — I10 Essential (primary) hypertension: Secondary | ICD-10-CM

## 2018-02-27 DIAGNOSIS — E785 Hyperlipidemia, unspecified: Secondary | ICD-10-CM

## 2018-02-27 DIAGNOSIS — E1159 Type 2 diabetes mellitus with other circulatory complications: Secondary | ICD-10-CM | POA: Diagnosis not present

## 2018-02-27 DIAGNOSIS — I493 Ventricular premature depolarization: Secondary | ICD-10-CM

## 2018-02-27 DIAGNOSIS — E1169 Type 2 diabetes mellitus with other specified complication: Secondary | ICD-10-CM

## 2018-02-27 DIAGNOSIS — I152 Hypertension secondary to endocrine disorders: Secondary | ICD-10-CM

## 2018-02-27 DIAGNOSIS — Z7901 Long term (current) use of anticoagulants: Secondary | ICD-10-CM

## 2018-02-27 DIAGNOSIS — I422 Other hypertrophic cardiomyopathy: Secondary | ICD-10-CM

## 2018-02-27 MED ORDER — CHLORTHALIDONE 25 MG PO TABS: 25.0000 mg | ORAL_TABLET | Freq: Every day | ORAL | 3 refills | Status: DC

## 2018-02-27 NOTE — Patient Instructions (Signed)
Medication Instructions:  Your physician has recommended you make the following change in your medication:   1) STOP taking HYDROCHLOROTHIAZIDE (Microzide) 12.5 mg   2) START taking CHLORTHALIDONE 25 MG BY MOUTH DAILY.    Labwork: Your physician recommends that you return for lab work in: 2 - 3 weeks at Dr. Warrick Parisian Office ([facility])   Testing/Procedures: None   Follow-Up: Your physician wants you to follow-up in 6 months with Dr. Tamala Julian.You will receive a reminder letter in the mail two months in advance. If you don't receive a letter, please call our office to schedule the follow-up appointment.   Any Other Special Instructions Will Be Listed Below (If Applicable).     If you need a refill on your cardiac medications before your next appointment, please call your pharmacy.

## 2018-03-01 ENCOUNTER — Other Ambulatory Visit: Payer: Self-pay | Admitting: Family Medicine

## 2018-03-01 DIAGNOSIS — E119 Type 2 diabetes mellitus without complications: Secondary | ICD-10-CM

## 2018-03-04 ENCOUNTER — Ambulatory Visit (INDEPENDENT_AMBULATORY_CARE_PROVIDER_SITE_OTHER): Payer: Medicare Other | Admitting: *Deleted

## 2018-03-04 ENCOUNTER — Encounter: Payer: Self-pay | Admitting: *Deleted

## 2018-03-04 VITALS — BP 138/58 | HR 62 | Ht 65.0 in | Wt 162.0 lb

## 2018-03-04 DIAGNOSIS — Z1211 Encounter for screening for malignant neoplasm of colon: Secondary | ICD-10-CM

## 2018-03-04 DIAGNOSIS — Z0000 Encounter for general adult medical examination without abnormal findings: Secondary | ICD-10-CM | POA: Diagnosis not present

## 2018-03-04 NOTE — Progress Notes (Signed)
Subjective:   Alan [name]. is a 77 y.o. male who presents for Medicare Annual/Subsequent preventive examination.  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  He enjoys gardening and doing work around his home.  He lives with his wife.  He has 2 sons and 2 daughters who are deceased, and 2 grandchildren.  He and his wife have 1 inside dog and 1 outside dog.  He feels his health this year is about the same as it was last year.  He reports no hospitalizations, ER visits, or surgeries in the past year.   Review of Systems:   All negative today  Cardiac Risk Factors include: advanced age (>70mn, >>85women);diabetes mellitus;hypertension;dyslipidemia     Objective:    Vitals: BP (!) 138/58   Pulse 62   Ht _0  (1.651 m)   Wt 162 lb (73.5 kg)   BMI 26.96 kg/m   Body mass index is 26.96 kg/m.  Advanced Directives 03/04/2018 01/03/2017 04/12/2016 04/09/2016 04/09/2016 11/26/2015 11/22/2015  Does Patient Have a Medical Advance Directive? _1  No No  Would patient like information on creating a medical advance directive? Yes (ED - Information included in AVS) Yes (MAU/Ambulatory/Procedural Areas - Information given) No - patient declined information No - patient declined information - No - patient declined information Yes - Educational materials given    Tobacco Social History   Tobacco Use  Smoking Status Former Smoker  . Types: Cigarettes  . Last attempt to quit: 12/11/1986  . Years since quitting: 31.2  Smokeless Tobacco Never Used  Tobacco Comment   Quit x 50 years     Counseling given: No Comment: Quit x 50 years   Clinical Intake:     Pain Score: 0-No pain                 Past Medical History:  Diagnosis Date  . Adenomatous colon polyp   . [place]granulocytosis ([place]   . Allergy   . Atrial fibrillation ([place] 04/19/2016  . Cataract    small  . Diabetes mellitus   . Diverticulosis   . ED (erectile dysfunction)   .  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 Gallstones   . GERD (gastroesophageal reflux disease)   . Gout   . Heart murmur   . Hyperlipidemia   . Hypertension   . Internal hemorrhoids   . Pancreatitis   . 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  . [place]state cancer ([place]    Followed by Dr. WJeffie Pollock   Past Surgical History:  Procedure Laterality Date  . CHOLECYSTECTOMY N/A 11/26/2015   Procedure: LAPAROSCOPIC CHOLECYSTECTOMY;  Surgeon: MAviva Signs MD;  Location: [facility];  Service: General;  Laterality: N/A;  . COLONOSCOPY  2012   MLucio Edward moderate sigmoid diverticulosis, internal hemorrhoids. next tcs 2017  . FLEXIBLE BRONCHOSCOPY Bilateral 04/14/2016   Procedure: FLEXIBLE BRONCHOSCOPY;  Surgeon: ESinda Du MD;  Location: [facility];  Service: Cardiopulmonary;  Laterality: Bilateral;  . SIGMOIDOSCOPY    . UMBILICAL HERNIA REPAIR N/A 11/26/2015   Procedure: UMBILICAL HERNIORRHAPHY;  Surgeon: MAviva Signs MD;  Location: [facility];  Service: General;  Laterality: N/A;   Family History  Problem Relation Age of Onset  . Stroke Mother   . Hypertension Mother   . Alzheimer's disease Mother   . Hypertension Father   . Brain cancer Sister   . Breast cancer Sister   . Hypertension  Sister   . Deep vein thrombosis Brother   . Hypertension Brother   . Colon cancer Neg Hx   . Pancreatic disease Neg Hx    Social History   Socioeconomic History  . Marital status: Married    Spouse name: Not on file  . Number of children: Not on file  . Years of education: Not on file  . Highest education level: Not on file  Occupational History  . Not on file  Social Needs  . Financial resource strain: Not on file  . Food insecurity:    Worry: Not on file    Inability: Not on file  . Transportation needs:    Medical: Not on file    Non-medical: Not on file  Tobacco Use  . Smoking status: Former Smoker    Types: Cigarettes    Last attempt to quit:  12/11/1986    Years since quitting: 31.2  . Smokeless tobacco: Never Used  . Tobacco comment: Quit x 50 years  Substance and Sexual Activity  . Alcohol use: No  . Drug use: No  . Sexual activity: Yes  Lifestyle  . Physical activity:    Days per week: Not on file    Minutes per session: Not on file  . Stress: Not on file  Relationships  . Social connections:    Talks on phone: Not on file    Gets together: Not on file    Attends religious service: Not on file    Active member of club or organization: Not on file    Attends meetings of clubs or organizations: Not on file    Relationship status: Not on file  Other Topics Concern  . Not on file  Social History Narrative  . Not on file    Outpatient Encounter Medications as of 03/04/2018  Medication Sig  . amLODipine (NORVASC) 10 MG tablet TAKE 1 TABLET BY MOUTH ONCE DAILY IN THE EVENING  . chlorthalidone (HYGROTON) 25 MG tablet Take 1 tablet (25 mg total) by mouth daily.  . ferrous sulfate 325 (65 FE) MG tablet Take 1 tablet (325 mg total) by mouth daily with breakfast.  . finasteride (PROSCAR) 5 MG tablet Take 5 mg by mouth daily.  . fluticasone (FLONASE) 50 MCG/ACT nasal spray Place 1 spray into both nostrils 2 (two) times daily as needed for allergies or rhinitis.  Marland Kitchen glucose blood (ONE TOUCH ULTRA TEST) test strip Use to check BG once daily.  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 lisinopril (PRINIVIL,ZESTRIL) 20 MG tablet Take 1.5 tablets (30 mg total) by mouth daily.  . metFORMIN (GLUCOPHAGE) 1000 MG tablet TAKE 1 TABLET BY MOUTH TWICE DAILY WITH A MEAL  . metoprolol succinate (TOPROL-XL) 100 MG 24 hr tablet Take 1 tablet (100 mg total) by mouth daily. Take with or immediately following a meal.  . niacin (NIASPAN) 1000 MG CR tablet TAKE 1 TABLET BY MOUTH ONCE DAILY AT BEDTIME  . Omega-3 Fatty Acids (FISH OIL) 1000 MG CAPS Take 2 capsules by mouth 2 (two) times daily.  Marland Kitchen omeprazole (PRILOSEC) 20 MG capsule Take 1 capsule (20 mg  total) by mouth daily.  . pravastatin (PRAVACHOL) 40 MG tablet TAKE 1 TABLET BY MOUTH ONCE DAILY IN THE EVENING  . warfarin (COUMADIN) 5 MG tablet TAKE 1 TO 1 & 1/2 (ONE & ONE-HALF) TABLETS BY MOUTH ONCE DAILY AT 6 PM   No facility-administered encounter medications on file as of 03/04/2018.     Activities of  Daily Living In your present state of health, do you have any difficulty performing the following activities: 03/04/2018  Hearing? N  Vision? N  Difficulty concentrating or making decisions? N  Walking or climbing stairs? N  Dressing or bathing? N  Doing errands, shopping? N  Preparing Food and eating ? N  Using the Toilet? N  In the past six months, have you accidently leaked urine? N  Do you have problems with loss of bowel control? N  Managing your Medications? N  Managing your Finances? N  Housekeeping or managing your Housekeeping? N  Some recent data might be hidden    Patient Care Team: Dettinger, Fransisca Kaufmann, MD as PCP - General (Family Medicine) Belva Crome, MD as PCP - Cardiology (Cardiology) Gala Romney Cristopher Estimable, MD as Consulting Physician (Gastroenterology) Irine Seal, MD as Attending Physician (Urology) Dickie La, RN as [facility] Management   Assessment:   This is a routine wellness examination for Virgie.  Exercise Activities and Dietary recommendations Current Exercise Habits: The patient does not participate in regular exercise at present, Exercise limited by: None identified  Patient states he is active doing things around his home, but does not do planned exercise.    Goals    . Exercise 3x per week (30 min per time)     Walking is a great option for exercise activity  Patient states he tries to eat a healthy diet- mostly lean proteins, vegetables, fruits, and whole grains.   Fall Risk Fall Risk  03/04/2018 11/12/2017 11/12/2017 08/31/2017 08/09/2017  Falls in the past year? _0   Number falls in past yr: - - - - -    Injury with Fall? - - - - -  Comment - - - - -  Risk for fall due to : - - - - -  Risk for fall due to: Comment - - - - -   Is the patient's home free of loose throw rugs in walkways, pet beds, electrical cords, etc?   yes      Grab bars in the bathroom? no      Handrails on the stairs?   yes      Adequate lighting?   no    Depression Screen PHQ 2/9 Scores 03/04/2018 11/12/2017 11/12/2017 08/31/2017  PHQ - 2 Score 0 0 0 0    Cognitive Function MMSE - Mini Mental State Exam 03/04/2018 01/03/2017 12/16/2015 12/16/2015  Orientation to time _1 Orientation to Place _2 Registration _3 Attention/ Calculation _4 Recall _5 Language- name 2 objects _6 Language- repeat _7 Language- follow 3 step command _8 Language- read & follow direction _9 -  Write a sentence _10 -  Copy design _11 -  Total score _12 -        Immunization History  Administered Date(s) Administered  . Influenza,inj,Quad PF,6+ Mos 10/02/2013, 11/12/2014, 09/08/2015, 08/31/2016, 09/11/2017  . Pneumococcal Conjugate-13 03/08/2015  . Pneumococcal Polysaccharide-23 01/10/2013    Qualifies for Shingles Vaccine? Yes, patient declined today  Screening Tests Health Maintenance  Topic Date Due  . COLONOSCOPY  08/23/2016  . OPHTHALMOLOGY EXAM  02/01/2017  . FOOT EXAM  05/08/2018  . HEMOGLOBIN A1C  08/16/2018  . TETANUS/TDAP  12/11/2022  . INFLUENZA VACCINE  Completed  . PNA vac Low Risk Adult  Completed   Patient agreeable to completing FOBT, kit given and advised him to return to [facility] once completed.  Called [facility] in [place], [place] to request copy of patient's most recent eye exam- per patient he is up to date.  Cancer Screenings: Lung: Low Dose CT Chest recommended if Age 39-80 years, 30 pack-year currently smoking OR have quit w/in 15years. Patient does qualify. Colorectal: FOBT given and asked him to return to the office after completion   Plan:      Work on increasing your exercise to 3 times per week for 30 minutes.   Complete stool test and return to our office.   Review information given on Advanced Directives.  If you decide to complete these please bring our office a copy to file in your medical record.     I have personally reviewed and noted the following in the patient's chart:   . Medical and social history . Use of alcohol, tobacco or illicit drugs  . Current medications and supplements . Functional ability and status . Nutritional status . Physical activity . Advanced directives . List of other physicians . Hospitalizations, surgeries, and ER visits in previous 12 months . Vitals . Screenings to include cognitive, depression, and falls . Referrals and appointments  In addition, I have reviewed and discussed with patient certain preventive protocols, quality metrics, and best practice recommendations. A written personalized care plan for preventive services as well as general preventive health recommendations were provided to patient.     Denyce Robert, RN  03/04/2018

## 2018-03-04 NOTE — Patient Instructions (Addendum)
Please work on increasing your exercise to 3 times per week for 30 minutes.    Please complete stool test and return to our office.    Please review information given on Advanced Directives.  If you decide to complete these please bring our office a copy to file in your medical record.  Thank you for coming in for your Annual Wellness Visit today!   Preventive Care 75 Years and Older, Male Preventive care refers to lifestyle choices and visits with your health care provider that can promote health and wellness. What does preventive care include?  A yearly physical exam. This is also called an annual well check.  Dental exams once or twice a year.  Routine eye exams. Ask your health care provider how often you should have your eyes checked.  Personal lifestyle choices, including: ? Daily care of your teeth and gums. ? Regular physical activity. ? Eating a healthy diet. ? Avoiding tobacco and drug use. ? Limiting alcohol use. ? Practicing safe sex. ? Taking low doses of aspirin every day. ? Taking vitamin and mineral supplements as recommended by your health care provider. What happens during an annual well check? The services and screenings done by your health care provider during your annual well check will depend on your age, overall health, lifestyle risk factors, and family history of disease. Counseling Your health care provider may ask you questions about your:  Alcohol use.  Tobacco use.  Drug use.  Emotional well-being.  Home and relationship well-being.  Sexual activity.  Eating habits.  History of falls.  Memory and ability to understand (cognition).  Work and work Statistician.  Screening You may have the following tests or measurements:  Height, weight, and BMI.  Blood pressure.  Lipid and cholesterol levels. These may be checked every 5 years, or more frequently if you are over 56 years old.  Skin check.  Lung cancer screening. You may have  this screening every year starting at age 75 if you have a 30-pack-year history of smoking and currently smoke or have quit within the past 15 years.  Fecal occult blood test (FOBT) of the stool. You may have this test every year starting at age 75.  Flexible sigmoidoscopy or colonoscopy. You may have a sigmoidoscopy every 5 years or a colonoscopy every 10 years starting at age 75.  Prostate cancer screening. Recommendations will vary depending on your family history and other risks.  Hepatitis C blood test.  Hepatitis B blood test.  Sexually transmitted disease (STD) testing.  Diabetes screening. This is done by checking your blood sugar (glucose) after you have not eaten for a while (fasting). You may have this done every 1-3 years.  Abdominal aortic aneurysm (AAA) screening. You may need this if you are a current or former smoker.  Osteoporosis. You may be screened starting at age 96 if you are at high risk.  Talk with your health care provider about your test results, treatment options, and if necessary, the need for more tests. Vaccines Your health care provider may recommend certain vaccines, such as:  Influenza vaccine. This is recommended every year.  Tetanus, diphtheria, and acellular pertussis (Tdap, Td) vaccine. You may need a Td booster every 10 years.  Varicella vaccine. You may need this if you have not been vaccinated.  Zoster vaccine. You may need this after age 33.  Measles, mumps, and rubella (MMR) vaccine. You may need at least one dose of MMR if you were born in 1957  or later. You may also need a second dose.  Pneumococcal 13-valent conjugate (PCV13) vaccine. One dose is recommended after age 17.  Pneumococcal polysaccharide (PPSV23) vaccine. One dose is recommended after age 40.  Meningococcal vaccine. You may need this if you have certain conditions.  Hepatitis A vaccine. You may need this if you have certain conditions or if you travel or work in places  where you may be exposed to hepatitis A.  Hepatitis B vaccine. You may need this if you have certain conditions or if you travel or work in places where you may be exposed to hepatitis B.  Haemophilus influenzae type b (Hib) vaccine. You may need this if you have certain risk factors.  Talk to your health care provider about which screenings and vaccines you need and how often you need them. This information is not intended to replace advice given to you by your health care provider. Make sure you discuss any questions you have with your health care provider. Document Released: 12/24/2015 Document Revised: 08/16/2016 Document Reviewed: 09/28/2015 Elsevier Interactive Patient Education  [employer].

## 2018-03-07 ENCOUNTER — Other Ambulatory Visit: Payer: Medicare Other

## 2018-03-07 DIAGNOSIS — Z1211 Encounter for screening for malignant neoplasm of colon: Secondary | ICD-10-CM | POA: Diagnosis not present

## 2018-03-08 LAB — FECAL OCCULT BLOOD, IMMUNOCHEMICAL: Fecal Occult Bld: NEGATIVE

## 2018-03-15 ENCOUNTER — Ambulatory Visit (INDEPENDENT_AMBULATORY_CARE_PROVIDER_SITE_OTHER): Payer: Medicare Other | Admitting: Pharmacist Clinician (PhC)/ Clinical Pharmacy Specialist

## 2018-03-15 DIAGNOSIS — Z86718 Personal history of other venous thrombosis and embolism: Secondary | ICD-10-CM | POA: Diagnosis not present

## 2018-03-15 DIAGNOSIS — Z7901 Long term (current) use of anticoagulants: Secondary | ICD-10-CM

## 2018-03-15 DIAGNOSIS — D6859 Other primary thrombophilia: Secondary | ICD-10-CM

## 2018-03-15 DIAGNOSIS — I480 Paroxysmal atrial fibrillation: Secondary | ICD-10-CM

## 2018-03-15 LAB — COAGUCHEK XS/INR WAIVED
INR: 3 — AB (ref 0.9–1.1)
PROTHROMBIN TIME: 36 s

## 2018-03-15 NOTE — Patient Instructions (Signed)
Description   Take 1 tablet on Sundays, Tuesdays, Thursdays only.  Take 1 and 1/2 tablets all the rest of the days.  INR was 3.0

## 2018-03-16 LAB — BMP8+EGFR
BUN/Creatinine Ratio: 15 (ref 10–24)
BUN: 15 mg/dL (ref 8–27)
CALCIUM: 9.7 mg/dL (ref 8.6–10.2)
CO2: 20 mmol/L (ref 20–29)
Chloride: 100 mmol/L (ref 96–106)
Creatinine, Ser: 0.98 mg/dL (ref 0.76–1.27)
GFR calc Af Amer: 87 mL/min/{1.73_m2} (ref >59)
GFR, EST NON AFRICAN AMERICAN: 75 mL/min/{1.73_m2} (ref >59)
Glucose: 132 mg/dL — ABNORMAL HIGH (ref 65–99)
Potassium: 3.2 mmol/L — ABNORMAL LOW (ref 3.5–5.2)
Sodium: 141 mmol/L (ref 134–144)

## 2018-03-18 ENCOUNTER — Telehealth: Payer: Self-pay | Admitting: *Deleted

## 2018-03-18 DIAGNOSIS — E876 Hypokalemia: Secondary | ICD-10-CM

## 2018-03-18 MED ORDER — POTASSIUM CHLORIDE CRYS ER 20 MEQ PO TBCR: 20.0000 meq | EXTENDED_RELEASE_TABLET | Freq: Every day | ORAL | 3 refills | Status: DC

## 2018-03-18 NOTE — Telephone Encounter (Signed)
Reviewing labs, pt with potassium of 3.2.  Pt not on potassium supplement.  Reviewed last ov note and discussed with Dr. Saunders Revel (DOD) who recommends start potassium chloride 20 meq daily and check BMET in 1 week.  I spoke with patient and informed.  He will pick up prescription and begin today.  Will go to his PCP ([facility]) to repeat BMET next Monday.  Spoke with their lab re: Epic orders for BMET.  They can use order that I am placing in Epic.

## 2018-03-25 ENCOUNTER — Other Ambulatory Visit: Payer: Medicare Other

## 2018-03-25 DIAGNOSIS — E876 Hypokalemia: Secondary | ICD-10-CM

## 2018-03-26 LAB — BASIC METABOLIC PANEL
BUN/Creatinine Ratio: 17 (ref 10–24)
BUN: 18 mg/dL (ref 8–27)
CHLORIDE: 103 mmol/L (ref 96–106)
CO2: 22 mmol/L (ref 20–29)
Calcium: 9.4 mg/dL (ref 8.6–10.2)
Creatinine, Ser: 1.08 mg/dL (ref 0.76–1.27)
GFR calc Af Amer: 77 mL/min/{1.73_m2} (ref >59)
GFR calc non Af Amer: 67 mL/min/{1.73_m2} (ref >59)
GLUCOSE: 137 mg/dL — AB (ref 65–99)
POTASSIUM: 4.4 mmol/L (ref 3.5–5.2)
Sodium: 143 mmol/L (ref 134–144)

## 2018-04-04 ENCOUNTER — Other Ambulatory Visit: Payer: Self-pay | Admitting: Interventional Cardiology

## 2018-04-04 DIAGNOSIS — I10 Essential (primary) hypertension: Secondary | ICD-10-CM

## 2018-04-26 ENCOUNTER — Other Ambulatory Visit: Payer: Self-pay | Admitting: Family Medicine

## 2018-05-03 ENCOUNTER — Ambulatory Visit: Payer: Medicare Other | Admitting: Pharmacist Clinician (PhC)/ Clinical Pharmacy Specialist

## 2018-05-03 DIAGNOSIS — Z7901 Long term (current) use of anticoagulants: Secondary | ICD-10-CM

## 2018-05-03 DIAGNOSIS — Z86718 Personal history of other venous thrombosis and embolism: Secondary | ICD-10-CM | POA: Diagnosis not present

## 2018-05-03 DIAGNOSIS — D6859 Other primary thrombophilia: Secondary | ICD-10-CM

## 2018-05-03 LAB — COAGUCHEK XS/INR WAIVED
INR: 1.7 — AB (ref 0.9–1.1)
Prothrombin Time: 20.2 s

## 2018-05-03 NOTE — Patient Instructions (Signed)
Description   Take 1 1/2 tablets every day of the week except for Tuesdays and Thursdays take 1 tablet  INR was 1.7 (goal 2-3) a little bit thick today from ensure

## 2018-05-20 ENCOUNTER — Ambulatory Visit (INDEPENDENT_AMBULATORY_CARE_PROVIDER_SITE_OTHER): Payer: Medicare Other | Admitting: Family Medicine

## 2018-05-20 ENCOUNTER — Encounter: Payer: Self-pay | Admitting: Family Medicine

## 2018-05-20 VITALS — BP 136/63 | HR 50 | Temp 97.8°F | Ht 65.0 in | Wt 162.0 lb

## 2018-05-20 DIAGNOSIS — Z86718 Personal history of other venous thrombosis and embolism: Secondary | ICD-10-CM | POA: Diagnosis not present

## 2018-05-20 DIAGNOSIS — E785 Hyperlipidemia, unspecified: Secondary | ICD-10-CM

## 2018-05-20 DIAGNOSIS — I10 Essential (primary) hypertension: Secondary | ICD-10-CM | POA: Diagnosis not present

## 2018-05-20 DIAGNOSIS — D6859 Other primary thrombophilia: Secondary | ICD-10-CM

## 2018-05-20 DIAGNOSIS — Z7901 Long term (current) use of anticoagulants: Secondary | ICD-10-CM | POA: Diagnosis not present

## 2018-05-20 DIAGNOSIS — E119 Type 2 diabetes mellitus without complications: Secondary | ICD-10-CM

## 2018-05-20 DIAGNOSIS — E1169 Type 2 diabetes mellitus with other specified complication: Secondary | ICD-10-CM

## 2018-05-20 DIAGNOSIS — E1159 Type 2 diabetes mellitus with other circulatory complications: Secondary | ICD-10-CM | POA: Diagnosis not present

## 2018-05-20 LAB — COAGUCHEK XS/INR WAIVED
INR: 4.3 — AB (ref 0.9–1.1)
PROTHROMBIN TIME: 51.3 s

## 2018-05-20 LAB — BAYER DCA HB A1C WAIVED: HB A1C: 6.5 % (ref <7.0)

## 2018-05-20 NOTE — Patient Instructions (Signed)
Description   Take 1 1/2 tablets every day of the week except for Tuesdays and Thursdays take 1 tablet  INR was 4.3 (goal 2-3) a little bit thick today from ensure

## 2018-05-20 NOTE — Progress Notes (Signed)
BP 136/63   Pulse (!) 50   Temp 97.8 F (36.6 C) (Oral)   Ht 5\' 5"  (1.651 m)   Wt 162 lb (73.5 kg)   BMI 26.96 kg/m    Subjective:    Patient ID: Alan Netters., male    DOB: 1941-01-24, 77 y.o.   MRN: [id number]  HPI: Alan Manolis. is a 77 y.o. male presenting on 05/20/2018 for Diabetes (patient is fasting); Hyperlipidemia; Hypertension; and Coagulation Disorder   HPI Hyperlipidemia Patient is coming in for recheck of his hyperlipidemia. The patient is currently taking pravastatin. They deny any issues with myalgias or history of liver damage from it. They deny any focal numbness or weakness or chest pain.   Hypertension Patient is currently on lisinopril and amlodipine and chlorthalidone and metoprolol, and their blood pressure today is 136/63. Patient denies any lightheadedness or dizziness. Patient denies headaches, blurred vision, chest pains, shortness of breath, or weakness. Denies any side effects from medication and is content with current medication.   Type 2 diabetes mellitus Patient comes in today for recheck of his diabetes. Patient has been currently taking metformin. Patient is currently on an ACE inhibitor/ARB. Patient has not seen an ophthalmologist this year. Patient denies any issues with their feet.   Chronic anticoagulation for DVTs and hypercoagulability Patient is coming in for recheck of his INR and Coumadin levels that he is on for chronic anticoagulation of DVTs and hypercoagulability state.  He denies any bleeding or bruising abnormally.  His INR is 4.3 and is elevated today.  Relevant past medical, surgical, family and social history reviewed and updated as indicated. Interim medical history since our last visit reviewed. Allergies and medications reviewed and updated.  Review of Systems  Constitutional: Negative for chills and fever.  Eyes: Negative for visual disturbance.  Respiratory: Negative for shortness of breath and wheezing.     Cardiovascular: Negative for chest pain and leg swelling.  Musculoskeletal: Negative for back pain and gait problem.  Skin: Negative for rash.  Neurological: Negative for dizziness, weakness and numbness.  All other systems reviewed and are negative.   Per HPI unless specifically indicated above   Allergies as of 05/20/2018      Reactions   Allopurinol Other (See Comments)   Significantly decreased WBC's   Colchicine Other (See Comments)   Significantly decreased WBC's      Medication List        Accurate as of 05/20/18 10:46 AM. Always use your most recent med list.          amLODipine 10 MG tablet Commonly known as:  NORVASC TAKE 1 TABLET BY MOUTH ONCE DAILY IN THE EVENING   chlorthalidone 25 MG tablet Commonly known as:  HYGROTON Take 1 tablet (25 mg total) by mouth daily.   ferrous sulfate 325 (65 FE) MG tablet Take 1 tablet (325 mg total) by mouth daily with breakfast.   finasteride 5 MG tablet Commonly known as:  PROSCAR Take 5 mg by mouth daily.   Fish Oil 1000 MG Caps Take 2 capsules by mouth 2 (two) times daily.   fluticasone 50 MCG/ACT nasal spray Commonly known as:  FLONASE Place 1 spray into both nostrils 2 (two) times daily as needed for allergies or rhinitis.   glucose blood test strip Commonly known as:  ONE TOUCH ULTRA TEST Use to check BG once daily.  Dx:  Type 2 DM controlled E11.9   lisinopril 20 MG tablet Commonly known as:  PRINIVIL,ZESTRIL  TAKE 1 & 1/2 (ONE & ONE-HALF) TABLETS BY MOUTH ONCE DAILY   metFORMIN 1000 MG tablet Commonly known as:  GLUCOPHAGE TAKE 1 TABLET BY MOUTH TWICE DAILY WITH A MEAL   metoprolol succinate 100 MG 24 hr tablet Commonly known as:  TOPROL-XL Take 1 tablet (100 mg total) by mouth daily. Take with or immediately following a meal.   niacin 1000 MG CR tablet Commonly known as:  NIASPAN TAKE 1 TABLET BY MOUTH ONCE DAILY AT BEDTIME   omeprazole 20 MG capsule Commonly known as:  PRILOSEC Take 1 capsule  (20 mg total) by mouth daily.   potassium chloride SA 20 MEQ tablet Commonly known as:  K-DUR,KLOR-CON Take 1 tablet (20 mEq total) by mouth daily.   pravastatin 40 MG tablet Commonly known as:  PRAVACHOL TAKE 1 TABLET BY MOUTH ONCE DAILY IN THE EVENING   warfarin 5 MG tablet Commonly known as:  COUMADIN Take as directed by the anticoagulation clinic. If you are unsure how to take this medication, talk to your nurse or doctor. Original instructions:  TAKE 1 & 1/2 (ONE & ONE-HALF) TABLETS BY MOUTH ONCE DAILY 6 IN THE EVENING          Objective:    BP 136/63   Pulse (!) 50   Temp 97.8 F (36.6 C) (Oral)   Ht 5\' 5"  (1.651 m)   Wt 162 lb (73.5 kg)   BMI 26.96 kg/m   Wt Readings from Last 3 Encounters:  05/20/18 162 lb (73.5 kg)  03/04/18 162 lb (73.5 kg)  02/27/18 165 lb (74.8 kg)    Physical Exam  Constitutional: He is oriented to person, place, and time. He appears well-developed and well-nourished. No distress.  Eyes: Conjunctivae are normal. No scleral icterus.  Neck: Neck supple. No thyromegaly present.  Cardiovascular: Normal rate, regular rhythm, normal heart sounds and intact distal pulses.  No murmur heard. Pulmonary/Chest: Effort normal and breath sounds normal. No respiratory distress. He has no wheezes.  Musculoskeletal: Normal range of motion. He exhibits no edema.  Lymphadenopathy:    He has no cervical adenopathy.  Neurological: He is alert and oriented to person, place, and time. Coordination normal.  Skin: Skin is warm and dry. No rash noted. He is not diaphoretic.  Psychiatric: He has a normal mood and affect. His behavior is normal.  Nursing note and vitals reviewed.   Description   Take 1 1/2 tablets every day of the week except for Tuesdays and Thursdays take 1 tablet  INR was 4.3 (goal 2-3) a little bit thick today from ensure        Assessment & Plan:   Problem List Items Addressed This Visit      Cardiovascular and Mediastinum    Hypertension associated with diabetes ([place])     Endocrine   Hyperlipidemia associated with type 2 diabetes mellitus ([place])   Type 2 diabetes mellitus without complications ([place]) - Primary   Relevant Orders   Bayer DCA Hb A1c Waived     Hematopoietic and Hemostatic   Primary hypercoagulable state ([place]) [D68.59]   Relevant Orders   CBC with Differential/Platelet     Other   Long term (current) use of anticoagulants [Z79.01]   History of DVT (deep vein thrombosis)       Follow up plan: Return in about 3 months (around 08/20/2018), or if symptoms worsen or fail to improve, for Diabetes and hypertension.  Counseling provided for all of the vaccine components Orders Placed This Encounter  Procedures  . CBC with Differential/Platelet  . Bayer [facility] Hb A1c [place], MD [facility] 05/20/2018, 10:46 AM

## 2018-05-21 DIAGNOSIS — C61 Malignant neoplasm of prostate: Secondary | ICD-10-CM | POA: Diagnosis not present

## 2018-05-21 LAB — CBC WITH DIFFERENTIAL/PLATELET
BASOS ABS: 0 10*3/uL (ref 0.0–0.2)
Basos: 0 %
EOS (ABSOLUTE): 0.2 10*3/uL (ref 0.0–0.4)
Eos: 4 %
HEMOGLOBIN: 10.4 g/dL — AB (ref 13.0–17.7)
Hematocrit: 32 % — ABNORMAL LOW (ref 37.5–51.0)
Immature Grans (Abs): 0 10*3/uL (ref 0.0–0.1)
Immature Granulocytes: 0 %
LYMPHS: 27 %
Lymphocytes Absolute: 1.4 10*3/uL (ref 0.7–3.1)
MCH: 27.9 pg (ref 26.6–33.0)
MCHC: 32.5 g/dL (ref 31.5–35.7)
MCV: 86 fL (ref 79–97)
MONOCYTES: 10 %
Monocytes Absolute: 0.5 10*3/uL (ref 0.1–0.9)
NEUTROS ABS: 3.1 10*3/uL (ref 1.4–7.0)
Neutrophils: 59 %
PLATELETS: 209 10*3/uL (ref 150–450)
RBC: 3.73 x10E6/uL — AB (ref 4.14–5.80)
RDW: 17.5 % — ABNORMAL HIGH (ref 12.3–15.4)
WBC: 5.3 10*3/uL (ref 3.4–10.8)

## 2018-05-24 ENCOUNTER — Ambulatory Visit: Payer: Medicare Other | Admitting: Urology

## 2018-05-24 DIAGNOSIS — C61 Malignant neoplasm of prostate: Secondary | ICD-10-CM | POA: Diagnosis not present

## 2018-05-24 DIAGNOSIS — R972 Elevated prostate specific antigen [PSA]: Secondary | ICD-10-CM

## 2018-05-24 DIAGNOSIS — N401 Enlarged prostate with lower urinary tract symptoms: Secondary | ICD-10-CM | POA: Diagnosis not present

## 2018-05-24 DIAGNOSIS — R351 Nocturia: Secondary | ICD-10-CM

## 2018-05-25 ENCOUNTER — Other Ambulatory Visit: Payer: Self-pay | Admitting: Family Medicine

## 2018-05-29 ENCOUNTER — Other Ambulatory Visit: Payer: Self-pay | Admitting: Urology

## 2018-05-29 DIAGNOSIS — C61 Malignant neoplasm of prostate: Secondary | ICD-10-CM

## 2018-05-29 DIAGNOSIS — R972 Elevated prostate specific antigen [PSA]: Secondary | ICD-10-CM

## 2018-06-05 ENCOUNTER — Other Ambulatory Visit: Payer: Self-pay | Admitting: Family Medicine

## 2018-06-05 DIAGNOSIS — E119 Type 2 diabetes mellitus without complications: Secondary | ICD-10-CM

## 2018-06-10 ENCOUNTER — Ambulatory Visit
Admission: RE | Admit: 2018-06-10 | Discharge: 2018-06-10 | Disposition: A | Discharge status: Home / Self Care | Payer: Medicare Other | Source: Ambulatory Visit | Attending: Urology | Admitting: Urology

## 2018-06-10 DIAGNOSIS — C61 Malignant neoplasm of prostate: Secondary | ICD-10-CM

## 2018-06-10 DIAGNOSIS — R972 Elevated prostate specific antigen [PSA]: Secondary | ICD-10-CM | POA: Diagnosis not present

## 2018-06-10 DIAGNOSIS — K5730 Diverticulosis of large intestine without perforation or abscess without bleeding: Secondary | ICD-10-CM | POA: Diagnosis not present

## 2018-06-10 MED ORDER — GADOBENATE DIMEGLUMINE 529 MG/ML IV SOLN
15.0000 mL | Freq: Once | INTRAVENOUS | Status: AC | PRN
  Administered 2018-06-10: 15 mL via INTRAVENOUS

## 2018-06-14 ENCOUNTER — Other Ambulatory Visit: Payer: Self-pay | Admitting: Urology

## 2018-06-14 DIAGNOSIS — N138 Other obstructive and reflux uropathy: Secondary | ICD-10-CM

## 2018-06-14 DIAGNOSIS — N401 Enlarged prostate with lower urinary tract symptoms: Principal | ICD-10-CM

## 2018-06-24 ENCOUNTER — Ambulatory Visit (INDEPENDENT_AMBULATORY_CARE_PROVIDER_SITE_OTHER): Payer: Medicare Other | Admitting: Family Medicine

## 2018-06-24 ENCOUNTER — Encounter: Payer: Self-pay | Admitting: Family Medicine

## 2018-06-24 DIAGNOSIS — Z7901 Long term (current) use of anticoagulants: Secondary | ICD-10-CM

## 2018-06-24 DIAGNOSIS — Z86718 Personal history of other venous thrombosis and embolism: Secondary | ICD-10-CM

## 2018-06-24 DIAGNOSIS — D6859 Other primary thrombophilia: Secondary | ICD-10-CM | POA: Diagnosis not present

## 2018-06-24 LAB — COAGUCHEK XS/INR WAIVED
INR: 2.2 — AB (ref 0.9–1.1)
Prothrombin Time: 26.1 s

## 2018-06-24 NOTE — Progress Notes (Signed)
BP (!) 150/58   Pulse 64   Temp (!) 97 F (36.1 C) (Oral)   Ht 5\' 5"  (1.651 m)   Wt 156 lb (70.8 kg)   BMI 25.96 kg/m    Subjective:    Patient ID: Alan Netters., male    DOB: 13-Aug-1941, 77 y.o.   MRN: [id number]  HPI: Alan [place]. is a 77 y.o. male presenting on 06/24/2018 for Coagulation Disorder (has appointment for biopsy on 06/28/18 at [facility], supposed to d/c Coumadine today)   HPI Hypercoagulable state, Coumadin recheck Patient is coming in for Coumadin recheck today for his chronic hypercoagulable state.  Patient denies any episodes of bleeding.  He denies any leg swelling or pain in his legs or shortness of breath or chest pain.  Relevant past medical, surgical, family and social history reviewed and updated as indicated. Interim medical history since our last visit reviewed. Allergies and medications reviewed and updated.  Review of Systems  Constitutional: Negative for chills and fever.  Respiratory: Negative for shortness of breath and wheezing.   Cardiovascular: Negative for chest pain and leg swelling.  Musculoskeletal: Negative for back pain and gait problem.  Skin: Negative for rash.  Neurological: Negative for dizziness, weakness, light-headedness and numbness.  All other systems reviewed and are negative.   Per HPI unless specifically indicated above   Allergies as of 06/24/2018      Reactions   Allopurinol Other (See Comments)   Significantly decreased WBC's   Colchicine Other (See Comments)   Significantly decreased WBC's      Medication List        Accurate as of 06/24/18  2:26 PM. Always use your most recent med list.          amLODipine 10 MG tablet Commonly known as:  NORVASC TAKE 1 TABLET BY MOUTH ONCE DAILY IN THE EVENING   chlorthalidone 25 MG tablet Commonly known as:  HYGROTON Take 1 tablet (25 mg total) by mouth daily.   finasteride 5 MG tablet Commonly known as:  PROSCAR Take 5 mg by mouth daily.   Fish Oil  1000 MG Caps Take 2 capsules by mouth 2 (two) times daily.   fluticasone 50 MCG/ACT nasal spray Commonly known as:  FLONASE Place 1 spray into both nostrils 2 (two) times daily as needed for allergies or rhinitis.   glucose blood test strip Commonly known as:  ONE TOUCH ULTRA TEST Use to check BG once daily.  Dx:  Type 2 DM controlled E11.9   Iron 325 (65 Fe) MG Tabs TAKE 1 TABLET BY MOUTH ONCE DAILY WITH BREAKFAST   lisinopril 20 MG tablet Commonly known as:  PRINIVIL,ZESTRIL TAKE 1 & 1/2 (ONE & ONE-HALF) TABLETS BY MOUTH ONCE DAILY   metFORMIN 1000 MG tablet Commonly known as:  GLUCOPHAGE TAKE 1 TABLET BY MOUTH TWICE DAILY WITH A MEAL   metoprolol succinate 100 MG 24 hr tablet Commonly known as:  TOPROL-XL Take 1 tablet (100 mg total) by mouth daily. Take with or immediately following a meal.   niacin 1000 MG CR tablet Commonly known as:  NIASPAN TAKE 1 TABLET BY MOUTH ONCE DAILY AT BEDTIME   omeprazole 20 MG capsule Commonly known as:  PRILOSEC Take 1 capsule (20 mg total) by mouth daily.   potassium chloride SA 20 MEQ tablet Commonly known as:  K-DUR,KLOR-CON Take 1 tablet (20 mEq total) by mouth daily.   pravastatin 40 MG tablet Commonly known as:  PRAVACHOL TAKE 1 TABLET  BY MOUTH ONCE DAILY IN THE EVENING   warfarin 5 MG tablet Commonly known as:  COUMADIN Take as directed by the anticoagulation clinic. If you are unsure how to take this medication, talk to your nurse or doctor. Original instructions:  TAKE 1 & 1/2 (ONE & ONE-HALF) TABLETS BY MOUTH ONCE DAILY 6 IN THE EVENING          Objective:    BP (!) 150/58   Pulse 64   Temp (!) 97 F (36.1 C) (Oral)   Ht 5\' 5"  (1.651 m)   Wt 156 lb (70.8 kg)   BMI 25.96 kg/m   Wt Readings from Last 3 Encounters:  06/24/18 156 lb (70.8 kg)  05/20/18 162 lb (73.5 kg)  03/04/18 162 lb (73.5 kg)    Physical Exam  Constitutional: He is oriented to person, place, and time. He appears well-developed and  well-nourished. No distress.  Eyes: Conjunctivae are normal. No scleral icterus.  Neck: Neck supple. No thyromegaly present.  Cardiovascular: Normal rate, regular rhythm, normal heart sounds and intact distal pulses.  No murmur heard. Pulmonary/Chest: Effort normal and breath sounds normal. No respiratory distress. He has no wheezes.  Musculoskeletal: Normal range of motion. He exhibits no edema.  Lymphadenopathy:    He has no cervical adenopathy.  Neurological: He is alert and oriented to person, place, and time. Coordination normal.  Skin: Skin is warm and dry. No rash noted. He is not diaphoretic.  Psychiatric: He has a normal mood and affect. His behavior is normal.  Nursing note and vitals reviewed.   Description   Continue same doseTake 1 1/2 tablets every day of the week except for Tuesdays and Thursdays take 1 tablet  INR was 2.2 (goal 2-3)         Assessment & Plan:   Problem List Items Addressed This Visit      Hematopoietic and Hemostatic   Primary hypercoagulable state ([place]) [D68.59]     Other   Long term (current) use of anticoagulants [Z79.01]   History of DVT (deep vein thrombosis)       Follow up plan: Return in about 3 weeks (around 07/15/2018), or if symptoms worsen or fail to improve, for Coumadin recheck with Sharyn Lull and then return to me and 6 months.  Counseling provided for all of the vaccine components No orders of the defined types were placed in this encounter.   Caryl Pina, MD [facility] 06/24/2018, 2:26 PM

## 2018-06-24 NOTE — Patient Instructions (Signed)
Description   Continue same doseTake 1 1/2 tablets every day of the week except for Tuesdays and Thursdays take 1 tablet  INR was 2.2 (goal 2-3)

## 2018-06-28 ENCOUNTER — Encounter: Payer: Self-pay | Admitting: Pharmacist Clinician (PhC)/ Clinical Pharmacy Specialist

## 2018-06-28 ENCOUNTER — Encounter (HOSPITAL_COMMUNITY): Payer: Self-pay

## 2018-06-28 ENCOUNTER — Ambulatory Visit (HOSPITAL_COMMUNITY)
Admission: RE | Admit: 2018-06-28 | Discharge: 2018-06-28 | Disposition: A | Discharge status: Home / Self Care | Payer: Medicare Other | Source: Ambulatory Visit | Attending: Urology | Admitting: Urology

## 2018-06-28 DIAGNOSIS — C61 Malignant neoplasm of prostate: Secondary | ICD-10-CM | POA: Insufficient documentation

## 2018-06-28 DIAGNOSIS — N401 Enlarged prostate with lower urinary tract symptoms: Secondary | ICD-10-CM

## 2018-06-28 DIAGNOSIS — N138 Other obstructive and reflux uropathy: Secondary | ICD-10-CM

## 2018-06-28 DIAGNOSIS — R972 Elevated prostate specific antigen [PSA]: Secondary | ICD-10-CM | POA: Diagnosis not present

## 2018-06-28 MED ORDER — LIDOCAINE HCL (PF) 2 % IJ SOLN
INTRAMUSCULAR | Status: AC
  Administered 2018-06-28: 10 mL
  Filled 2018-06-28: qty 10

## 2018-06-28 MED ORDER — LIDOCAINE HCL (PF) 1 % IJ SOLN
INTRAMUSCULAR | Status: AC
  Administered 2018-06-28: 2.1 mL
  Filled 2018-06-28: qty 5

## 2018-06-28 MED ORDER — CEFTRIAXONE SODIUM 1 G IJ SOLR
INTRAMUSCULAR | Status: AC
  Administered 2018-06-28: 1 g via INTRAMUSCULAR
  Filled 2018-06-28: qty 10

## 2018-06-28 MED ORDER — LIDOCAINE HCL (PF) 2 % IJ SOLN
10.0000 mL | Freq: Once | INTRAMUSCULAR | Status: AC
  Administered 2018-06-28: 10 mL

## 2018-06-28 MED ORDER — CEFTRIAXONE SODIUM 1 G IJ SOLR
1.0000 g | Freq: Once | INTRAMUSCULAR | Status: AC
  Administered 2018-06-28: 1 g via INTRAMUSCULAR

## 2018-06-28 NOTE — Consent Form (Signed)
CLINICAL DATA:    Ultrasound was provided for use by the ordering physician, and a technical  charge was applied by the performing facility.  No radiologist  interpretation/professional services rendered.   

## 2018-07-12 ENCOUNTER — Ambulatory Visit (INDEPENDENT_AMBULATORY_CARE_PROVIDER_SITE_OTHER): Payer: Medicare Other | Admitting: Pharmacist Clinician (PhC)/ Clinical Pharmacy Specialist

## 2018-07-12 DIAGNOSIS — D6859 Other primary thrombophilia: Secondary | ICD-10-CM

## 2018-07-12 DIAGNOSIS — Z86718 Personal history of other venous thrombosis and embolism: Secondary | ICD-10-CM | POA: Diagnosis not present

## 2018-07-12 DIAGNOSIS — Z7901 Long term (current) use of anticoagulants: Secondary | ICD-10-CM

## 2018-07-12 LAB — COAGUCHEK XS/INR WAIVED
INR: 2 — AB (ref 0.9–1.1)
Prothrombin Time: 24.4 s

## 2018-07-12 NOTE — Patient Instructions (Signed)
Description   Continue same dose:  take 1 1/2 tablets every day of the week except for Tuesdays and Thursdays take 1 tablet  INR was 2.0 (goal 2-3)

## 2018-07-19 ENCOUNTER — Ambulatory Visit (INDEPENDENT_AMBULATORY_CARE_PROVIDER_SITE_OTHER): Payer: Medicare Other | Admitting: Urology

## 2018-07-19 DIAGNOSIS — R3121 Asymptomatic microscopic hematuria: Secondary | ICD-10-CM

## 2018-07-19 DIAGNOSIS — C61 Malignant neoplasm of prostate: Secondary | ICD-10-CM | POA: Diagnosis not present

## 2018-07-24 ENCOUNTER — Other Ambulatory Visit: Payer: Self-pay

## 2018-07-24 MED ORDER — WARFARIN SODIUM 5 MG PO TABS: ORAL_TABLET | ORAL | 0 refills | Status: DC

## 2018-08-21 ENCOUNTER — Encounter: Payer: Self-pay | Admitting: Family Medicine

## 2018-08-21 ENCOUNTER — Ambulatory Visit (INDEPENDENT_AMBULATORY_CARE_PROVIDER_SITE_OTHER): Payer: Medicare Other | Admitting: Family Medicine

## 2018-08-21 VITALS — BP 140/53 | HR 52 | Temp 97.9°F | Ht 65.0 in | Wt 162.2 lb

## 2018-08-21 DIAGNOSIS — E1169 Type 2 diabetes mellitus with other specified complication: Secondary | ICD-10-CM

## 2018-08-21 DIAGNOSIS — Z7901 Long term (current) use of anticoagulants: Secondary | ICD-10-CM | POA: Diagnosis not present

## 2018-08-21 DIAGNOSIS — D6859 Other primary thrombophilia: Secondary | ICD-10-CM

## 2018-08-21 DIAGNOSIS — K219 Gastro-esophageal reflux disease without esophagitis: Secondary | ICD-10-CM | POA: Diagnosis not present

## 2018-08-21 DIAGNOSIS — I480 Paroxysmal atrial fibrillation: Secondary | ICD-10-CM | POA: Diagnosis not present

## 2018-08-21 DIAGNOSIS — I10 Essential (primary) hypertension: Secondary | ICD-10-CM

## 2018-08-21 DIAGNOSIS — E1159 Type 2 diabetes mellitus with other circulatory complications: Secondary | ICD-10-CM

## 2018-08-21 DIAGNOSIS — Z86718 Personal history of other venous thrombosis and embolism: Secondary | ICD-10-CM

## 2018-08-21 DIAGNOSIS — E785 Hyperlipidemia, unspecified: Secondary | ICD-10-CM

## 2018-08-21 DIAGNOSIS — E119 Type 2 diabetes mellitus without complications: Secondary | ICD-10-CM | POA: Diagnosis not present

## 2018-08-21 LAB — COAGUCHEK XS/INR WAIVED
INR: 2.1 — ABNORMAL HIGH (ref 0.9–1.1)
PROTHROMBIN TIME: 25.4 s

## 2018-08-21 LAB — BAYER DCA HB A1C WAIVED: HB A1C: 6.3 % (ref <7.0)

## 2018-08-21 MED ORDER — METOPROLOL SUCCINATE ER 100 MG PO TB24: 100.0000 mg | ORAL_TABLET | Freq: Every day | ORAL | 3 refills | Status: DC

## 2018-08-21 MED ORDER — METFORMIN HCL 1000 MG PO TABS: ORAL_TABLET | ORAL | 3 refills | Status: DC

## 2018-08-21 MED ORDER — PRAVASTATIN SODIUM 40 MG PO TABS: 40.0000 mg | ORAL_TABLET | Freq: Every evening | ORAL | 3 refills | Status: DC

## 2018-08-21 MED ORDER — AMLODIPINE BESYLATE 10 MG PO TABS: 10.0000 mg | ORAL_TABLET | Freq: Every evening | ORAL | 3 refills | Status: DC

## 2018-08-21 NOTE — Progress Notes (Signed)
BP (!) 140/53   Pulse (!) 52   Temp 97.9 F (36.6 C) (Oral)   Ht 5\' 5"  (1.651 m)   Wt 162 lb 3.2 oz (73.6 kg)   BMI 26.99 kg/m    Subjective:    Patient ID: Alan Netters., male    DOB: 1941-07-03, 77 y.o.   MRN: [id number]  HPI: Alan Brum. is a 77 y.o. male presenting on 08/21/2018 for Diabetes (3 month follow up); Hypertension; and Coagulation Disorder   HPI Type 2 diabetes mellitus Patient comes in today for recheck of his diabetes. Patient has been currently taking metformin. Patient is currently on an ACE inhibitor/ARB. Patient has not seen an ophthalmologist this year. Patient denies any issues with their feet.   Hyperlipidemia Patient is coming in for recheck of his hyperlipidemia. The patient is currently taking omega-3 and pravastatin. They deny any issues with myalgias or history of liver damage from it. They deny any focal numbness or weakness or chest pain.   Hypertension Patient is currently on metoprolol and lisinopril, and their blood pressure today is 140/53. Patient denies any lightheadedness or dizziness. Patient denies headaches, blurred vision, chest pains, shortness of breath, or weakness. Denies any side effects from medication and is content with current medication.   GERD Patient is currently on omeprazole.  She denies any major symptoms or abdominal pain or belching or burping. She denies any blood in her stool or lightheadedness or dizziness.   A. fib and hypercoagulable state Patient is coming in for A. fib and Coumadin recheck today.  He is on the medication long-term because of his hypercoagulable state and the A. fib.  He denies any palpitations or bleeding or bruising.  Relevant past medical, surgical, family and social history reviewed and updated as indicated. Interim medical history since our last visit reviewed. Allergies and medications reviewed and updated.  Review of Systems  Constitutional: Negative for chills and fever.  Respiratory:  Negative for shortness of breath and wheezing.   Cardiovascular: Negative for chest pain and leg swelling.  Gastrointestinal: Negative for abdominal pain.  Musculoskeletal: Negative for back pain and gait problem.  Skin: Negative for rash.  Neurological: Negative for dizziness, weakness and light-headedness.  All other systems reviewed and are negative.   Per HPI unless specifically indicated above   Allergies as of 08/21/2018      Reactions   Allopurinol Other (See Comments)   Significantly decreased WBC's   Colchicine Other (See Comments)   Significantly decreased WBC's      Medication List        Accurate as of 08/21/18  8:44 AM. Always use your most recent med list.          amLODipine 10 MG tablet Commonly known as:  NORVASC TAKE 1 TABLET BY MOUTH ONCE DAILY IN THE EVENING   chlorthalidone 25 MG tablet Commonly known as:  HYGROTON Take 1 tablet (25 mg total) by mouth daily.   finasteride 5 MG tablet Commonly known as:  PROSCAR Take 5 mg by mouth daily.   Fish Oil 1000 MG Caps Take 2 capsules by mouth 2 (two) times daily.   fluticasone 50 MCG/ACT nasal spray Commonly known as:  FLONASE Place 1 spray into both nostrils 2 (two) times daily as needed for allergies or rhinitis.   glucose blood test strip Use to check BG once daily.  Dx:  Type 2 DM controlled E11.9   Iron 325 (65 Fe) MG Tabs TAKE 1 TABLET BY  MOUTH ONCE DAILY WITH BREAKFAST   lisinopril 20 MG tablet Commonly known as:  PRINIVIL,ZESTRIL TAKE 1 & 1/2 (ONE & ONE-HALF) TABLETS BY MOUTH ONCE DAILY   metFORMIN 1000 MG tablet Commonly known as:  GLUCOPHAGE TAKE 1 TABLET BY MOUTH TWICE DAILY WITH A MEAL   metoprolol succinate 100 MG 24 hr tablet Commonly known as:  TOPROL-XL Take 1 tablet (100 mg total) by mouth daily. Take with or immediately following a meal.   omeprazole 20 MG capsule Commonly known as:  PRILOSEC Take 1 capsule (20 mg total) by mouth daily.   potassium chloride SA 20 MEQ  tablet Commonly known as:  K-DUR,KLOR-CON Take 1 tablet (20 mEq total) by mouth daily.   pravastatin 40 MG tablet Commonly known as:  PRAVACHOL TAKE 1 TABLET BY MOUTH ONCE DAILY IN THE EVENING   warfarin 5 MG tablet Commonly known as:  COUMADIN Take as directed by the anticoagulation clinic. If you are unsure how to take this medication, talk to your nurse or doctor. Original instructions:  TAKE 1 & 1/2 (ONE & ONE-HALF) TABLETS BY MOUTH ONCE DAILY 6 IN THE EVENING          Objective:    BP (!) 140/53   Pulse (!) 52   Temp 97.9 F (36.6 C) (Oral)   Ht 5\' 5"  (1.651 m)   Wt 162 lb 3.2 oz (73.6 kg)   BMI 26.99 kg/m   Wt Readings from Last 3 Encounters:  08/21/18 162 lb 3.2 oz (73.6 kg)  06/24/18 156 lb (70.8 kg)  05/20/18 162 lb (73.5 kg)    Physical Exam  Constitutional: He is oriented to person, place, and time. He appears well-developed and well-nourished. No distress.  Eyes: Conjunctivae are normal. No scleral icterus.  Neck: Neck supple. No thyromegaly present.  Cardiovascular: Normal rate, normal heart sounds and intact distal pulses. An irregular rhythm present.  No murmur heard. Pulmonary/Chest: Effort normal and breath sounds normal. No respiratory distress. He has no wheezes.  Musculoskeletal: Normal range of motion. He exhibits no edema.  Lymphadenopathy:    He has no cervical adenopathy.  Neurological: He is alert and oriented to person, place, and time. Coordination normal.  Skin: Skin is warm and dry. No rash noted. He is not diaphoretic.  Psychiatric: He has a normal mood and affect. His behavior is normal.  Nursing note and vitals reviewed.   Description   Continue same dose:  take 1 1/2 tablets every day of the week except for Tuesdays and Thursdays take 1 tablet  INR was 2.1 (goal 2-3)         Assessment & Plan:   Problem List Items Addressed This Visit      Cardiovascular and Mediastinum   Hypertension associated with diabetes ([place])    Relevant Medications   amLODipine (NORVASC) 10 MG tablet   metoprolol succinate (TOPROL-XL) 100 MG 24 hr tablet   pravastatin (PRAVACHOL) 40 MG tablet   metFORMIN (GLUCOPHAGE) 1000 MG tablet   Paroxysmal atrial fibrillation (HCC)   Relevant Medications   amLODipine (NORVASC) 10 MG tablet   metoprolol succinate (TOPROL-XL) 100 MG 24 hr tablet   pravastatin (PRAVACHOL) 40 MG tablet     Digestive   GERD (gastroesophageal reflux disease)     Endocrine   Hyperlipidemia associated with type 2 diabetes mellitus (HCC)   Relevant Medications   amLODipine (NORVASC) 10 MG tablet   metoprolol succinate (TOPROL-XL) 100 MG 24 hr tablet   pravastatin (PRAVACHOL) 40 MG tablet  metFORMIN (GLUCOPHAGE) 1000 MG tablet   Type 2 diabetes mellitus without complications (HCC) - Primary   Relevant Medications   pravastatin (PRAVACHOL) 40 MG tablet   metFORMIN (GLUCOPHAGE) 1000 MG tablet     Hematopoietic and Hemostatic   Primary hypercoagulable state ([place]) [D68.59]     Other   Long term (current) use of anticoagulants [Z79.01]   History of DVT (deep vein thrombosis)       Follow up plan: Return in about 4 weeks (around 09/18/2018), or if symptoms worsen or fail to improve, for Coumadin recheck in 4 weeks, diabetes recheck in 3 months.  Counseling provided for all of the vaccine components No orders of the defined types were placed in this encounter.   Caryl Pina, MD [facility] 08/21/2018, 8:44 AM

## 2018-08-22 LAB — MICROALBUMIN / CREATININE URINE RATIO
CREATININE, UR: 93.2 mg/dL
MICROALB/CREAT RATIO: 4.9 mg/g{creat} (ref 0.0–30.0)
MICROALBUM., U, RANDOM: 4.6 ug/mL

## 2018-09-18 ENCOUNTER — Ambulatory Visit (INDEPENDENT_AMBULATORY_CARE_PROVIDER_SITE_OTHER): Payer: Medicare Other | Admitting: *Deleted

## 2018-09-18 ENCOUNTER — Ambulatory Visit (INDEPENDENT_AMBULATORY_CARE_PROVIDER_SITE_OTHER): Payer: Medicare Other | Admitting: Pharmacist Clinician (PhC)/ Clinical Pharmacy Specialist

## 2018-09-18 DIAGNOSIS — Z7901 Long term (current) use of anticoagulants: Secondary | ICD-10-CM

## 2018-09-18 DIAGNOSIS — Z23 Encounter for immunization: Secondary | ICD-10-CM | POA: Diagnosis not present

## 2018-09-18 DIAGNOSIS — D6859 Other primary thrombophilia: Secondary | ICD-10-CM

## 2018-09-18 DIAGNOSIS — Z86718 Personal history of other venous thrombosis and embolism: Secondary | ICD-10-CM | POA: Diagnosis not present

## 2018-09-18 LAB — COAGUCHEK XS/INR WAIVED
INR: 2.4 — ABNORMAL HIGH (ref 0.9–1.1)
Prothrombin Time: 28.5 s

## 2018-09-18 NOTE — Patient Instructions (Signed)
Description   Continue same dose:  take 1 1/2 tablets every day of the week except for Tuesdays and Thursdays take 1 tablet  INR was 2.4  (goal 2-3)

## 2018-10-25 LAB — GLUCOSE, POCT (MANUAL RESULT ENTRY): POC GLUCOSE: 108 mg/dL — AB (ref 70–99)

## 2018-10-30 ENCOUNTER — Ambulatory Visit (INDEPENDENT_AMBULATORY_CARE_PROVIDER_SITE_OTHER): Payer: Medicare Other | Admitting: Pharmacist Clinician (PhC)/ Clinical Pharmacy Specialist

## 2018-10-30 DIAGNOSIS — Z7901 Long term (current) use of anticoagulants: Secondary | ICD-10-CM

## 2018-10-30 DIAGNOSIS — D6859 Other primary thrombophilia: Secondary | ICD-10-CM | POA: Diagnosis not present

## 2018-10-30 DIAGNOSIS — Z86718 Personal history of other venous thrombosis and embolism: Secondary | ICD-10-CM

## 2018-10-30 LAB — COAGUCHEK XS/INR WAIVED
INR: 2.7 — ABNORMAL HIGH (ref 0.9–1.1)
Prothrombin Time: 32.6 s

## 2018-10-30 NOTE — Patient Instructions (Signed)
Description   Continue same dose:  take 1 1/2 tablets every day of the week except for Tuesdays and Thursdays take 1 tablet  INR was 2.7  (goal 2-3) Perfect reading

## 2018-11-21 ENCOUNTER — Ambulatory Visit (INDEPENDENT_AMBULATORY_CARE_PROVIDER_SITE_OTHER): Payer: Medicare Other | Admitting: Family Medicine

## 2018-11-21 ENCOUNTER — Other Ambulatory Visit: Payer: Self-pay | Admitting: Family Medicine

## 2018-11-21 ENCOUNTER — Encounter: Payer: Self-pay | Admitting: Family Medicine

## 2018-11-21 VITALS — BP 145/64 | HR 74 | Temp 97.4°F | Ht 65.0 in | Wt 159.4 lb

## 2018-11-21 DIAGNOSIS — E1169 Type 2 diabetes mellitus with other specified complication: Secondary | ICD-10-CM | POA: Diagnosis not present

## 2018-11-21 DIAGNOSIS — E1159 Type 2 diabetes mellitus with other circulatory complications: Secondary | ICD-10-CM

## 2018-11-21 DIAGNOSIS — I10 Essential (primary) hypertension: Secondary | ICD-10-CM | POA: Diagnosis not present

## 2018-11-21 DIAGNOSIS — I824Y9 Acute embolism and thrombosis of unspecified deep veins of unspecified proximal lower extremity: Secondary | ICD-10-CM

## 2018-11-21 DIAGNOSIS — I152 Hypertension secondary to endocrine disorders: Secondary | ICD-10-CM

## 2018-11-21 DIAGNOSIS — E785 Hyperlipidemia, unspecified: Secondary | ICD-10-CM

## 2018-11-21 DIAGNOSIS — E119 Type 2 diabetes mellitus without complications: Secondary | ICD-10-CM | POA: Diagnosis not present

## 2018-11-21 DIAGNOSIS — K219 Gastro-esophageal reflux disease without esophagitis: Secondary | ICD-10-CM | POA: Diagnosis not present

## 2018-11-21 LAB — CBC WITH DIFFERENTIAL/PLATELET
BASOS ABS: 0 10*3/uL (ref 0.0–0.2)
Basos: 0 %
EOS (ABSOLUTE): 0.1 10*3/uL (ref 0.0–0.4)
Eos: 2 %
HEMATOCRIT: 35.8 % — AB (ref 37.5–51.0)
Hemoglobin: 12.4 g/dL — ABNORMAL LOW (ref 13.0–17.7)
Immature Grans (Abs): 0 10*3/uL (ref 0.0–0.1)
Immature Granulocytes: 0 %
LYMPHS ABS: 1.6 10*3/uL (ref 0.7–3.1)
Lymphs: 26 %
MCH: 31 pg (ref 26.6–33.0)
MCHC: 34.6 g/dL (ref 31.5–35.7)
MCV: 90 fL (ref 79–97)
MONOS ABS: 0.6 10*3/uL (ref 0.1–0.9)
Monocytes: 10 %
Neutrophils Absolute: 3.9 10*3/uL (ref 1.4–7.0)
Neutrophils: 62 %
PLATELETS: 222 10*3/uL (ref 150–450)
RBC: 4 x10E6/uL — ABNORMAL LOW (ref 4.14–5.80)
RDW: 14.2 % (ref 12.3–15.4)
WBC: 6.3 10*3/uL (ref 3.4–10.8)

## 2018-11-21 LAB — CMP14+EGFR
ALK PHOS: 48 IU/L (ref 39–117)
ALT: 14 IU/L (ref 0–44)
AST: 21 IU/L (ref 0–40)
Albumin/Globulin Ratio: 1.4 (ref 1.2–2.2)
Albumin: 3.9 g/dL (ref 3.5–4.8)
BUN/Creatinine Ratio: 11 (ref 10–24)
BUN: 11 mg/dL (ref 8–27)
Bilirubin Total: 0.8 mg/dL (ref 0.0–1.2)
CO2: 22 mmol/L (ref 20–29)
CREATININE: 0.97 mg/dL (ref 0.76–1.27)
Calcium: 9.6 mg/dL (ref 8.6–10.2)
Chloride: 103 mmol/L (ref 96–106)
GFR calc Af Amer: 88 mL/min/{1.73_m2} (ref >59)
GFR calc non Af Amer: 76 mL/min/{1.73_m2} (ref >59)
GLOBULIN, TOTAL: 2.8 g/dL (ref 1.5–4.5)
Glucose: 121 mg/dL — ABNORMAL HIGH (ref 65–99)
POTASSIUM: 3.4 mmol/L — AB (ref 3.5–5.2)
SODIUM: 144 mmol/L (ref 134–144)
Total Protein: 6.7 g/dL (ref 6.0–8.5)

## 2018-11-21 LAB — LIPID PANEL
CHOLESTEROL TOTAL: 90 mg/dL — AB (ref 100–199)
Chol/HDL Ratio: 3.1 ratio (ref 0.0–5.0)
HDL: 29 mg/dL — AB (ref >39)
LDL CALC: 31 mg/dL (ref 0–99)
TRIGLYCERIDES: 148 mg/dL (ref 0–149)
VLDL Cholesterol Cal: 30 mg/dL (ref 5–40)

## 2018-11-21 NOTE — Progress Notes (Signed)
BP (!) 145/64   Pulse 74   Temp (!) 97.4 F (36.3 C) (Oral)   Ht '5\' 5"'  (1.651 m)   Wt 159 lb 6.4 oz (72.3 kg)   BMI 26.53 kg/m    Subjective:    Patient ID: Alan Netters., male    DOB: [birth year], 77 y.o.   MRN: [id number]  HPI: Alan Dieppa. is a 77 y.o. male presenting on 11/21/2018 for Diabetes (3 month follow up ) and Hypertension   HPI Type 2 diabetes mellitus Patient comes in today for recheck of his diabetes. Patient has been currently taking metformin. Patient is currently on an ACE inhibitor/ARB. Patient has seen an ophthalmologist this year. Patient denies any issues with their feet.   Hypertension Patient is currently on amlodipine and chlorthalidone and lisinopril, and their blood pressure today is 145/64. Patient denies any lightheadedness or dizziness. Patient denies headaches, blurred vision, chest pains, shortness of breath, or weakness. Denies any side effects from medication and is content with current medication.   Hyperlipidemia Patient is coming in for recheck of his hyperlipidemia. The patient is currently taking omega-3's and pravastatin. They deny any issues with myalgias or history of liver damage from it. They deny any focal numbness or weakness or chest pain.   GERD Patient is currently on omeprazole.  She denies any major symptoms or abdominal pain or belching or burping. She denies any blood in her stool or lightheadedness or dizziness.   DVT, patient is on Coumadin keeping up on it.  Patient denies any bleeding or chest pain or palpitations  Relevant past medical, surgical, family and social history reviewed and updated as indicated. Interim medical history since our last visit reviewed. Allergies and medications reviewed and updated.  Review of Systems  Constitutional: Negative for chills and fever.  Eyes: Negative for visual disturbance.  Respiratory: Negative for shortness of breath and wheezing.   Cardiovascular: Negative for chest pain and  leg swelling.  Musculoskeletal: Negative for back pain and gait problem.  Skin: Negative for rash.  Neurological: Negative for dizziness, weakness, light-headedness and numbness.  All other systems reviewed and are negative.   Per HPI unless specifically indicated above   Allergies as of 11/21/2018      Reactions   Allopurinol Other (See Comments)   Significantly decreased WBC's   Colchicine Other (See Comments)   Significantly decreased WBC's      Medication List       Accurate as of November 21, 2018  8:43 AM. Always use your most recent med list.        amLODipine 10 MG tablet Commonly known as:  NORVASC Take 1 tablet (10 mg total) by mouth every evening.   chlorthalidone 25 MG tablet Commonly known as:  HYGROTON Take 1 tablet (25 mg total) by mouth daily.   finasteride 5 MG tablet Commonly known as:  PROSCAR Take 5 mg by mouth daily.   Fish Oil 1000 MG Caps Take 2 capsules by mouth 2 (two) times daily.   fluticasone 50 MCG/ACT nasal spray Commonly known as:  FLONASE Place 1 spray into both nostrils 2 (two) times daily as needed for allergies or rhinitis.   glucose blood test strip Commonly known as:  ONE TOUCH ULTRA TEST Use to check BG once daily.  Dx:  Type 2 DM controlled E11.9   Iron 325 (65 Fe) MG Tabs TAKE 1 TABLET BY MOUTH ONCE DAILY WITH BREAKFAST   lisinopril 20 MG tablet Commonly known as:  PRINIVIL,ZESTRIL TAKE 1 & 1/2 (ONE & ONE-HALF) TABLETS BY MOUTH ONCE DAILY   metFORMIN 1000 MG tablet Commonly known as:  GLUCOPHAGE TAKE 1 TABLET BY MOUTH TWICE DAILY WITH A MEAL   metoprolol succinate 100 MG 24 hr tablet Commonly known as:  TOPROL-XL Take 1 tablet (100 mg total) by mouth daily. Take with or immediately following a meal.   omeprazole 20 MG capsule Commonly known as:  PRILOSEC Take 1 capsule (20 mg total) by mouth daily.   potassium chloride SA 20 MEQ tablet Commonly known as:  K-DUR,KLOR-CON Take 1 tablet (20 mEq total) by mouth  daily.   pravastatin 40 MG tablet Commonly known as:  PRAVACHOL Take 1 tablet (40 mg total) by mouth every evening.   warfarin 5 MG tablet Commonly known as:  COUMADIN Take as directed by the anticoagulation clinic. If you are unsure how to take this medication, talk to your nurse or doctor. Original instructions:  TAKE 1 & 1/2 (ONE & ONE-HALF) TABLETS BY MOUTH ONCE DAILY 6 IN THE EVENING          Objective:    BP (!) 145/64   Pulse 74   Temp (!) 97.4 F (36.3 C) (Oral)   Ht '5\' 5"'  (1.651 m)   Wt 159 lb 6.4 oz (72.3 kg)   BMI 26.53 kg/m   Wt Readings from Last 3 Encounters:  11/21/18 159 lb 6.4 oz (72.3 kg)  08/21/18 162 lb 3.2 oz (73.6 kg)  06/24/18 156 lb (70.8 kg)    Physical Exam Vitals signs and nursing note reviewed.  Constitutional:      General: He is not in acute distress.    Appearance: He is well-developed. He is not diaphoretic.  Eyes:     General: No scleral icterus.    Conjunctiva/sclera: Conjunctivae normal.  Neck:     Musculoskeletal: Neck supple.     Thyroid: No thyromegaly.  Cardiovascular:     Rate and Rhythm: Normal rate and regular rhythm.     Heart sounds: Normal heart sounds. No murmur.  Pulmonary:     Effort: Pulmonary effort is normal. No respiratory distress.     Breath sounds: Normal breath sounds. No wheezing.  Musculoskeletal: Normal range of motion.        General: No swelling.  Lymphadenopathy:     Cervical: No cervical adenopathy.  Skin:    General: Skin is warm and dry.     Findings: No rash.  Neurological:     Mental Status: He is alert and oriented to person, place, and time.     Coordination: Coordination normal.  Psychiatric:        Behavior: Behavior normal.         Assessment & Plan:   Problem List Items Addressed This Visit      Cardiovascular and Mediastinum   Hypertension associated with diabetes ([place])   Relevant Orders   CMP14+EGFR (Completed)   Acute venous embolism and thrombosis of deep vessels of  proximal lower extremity (HCC) [I82.4Y9]   Relevant Orders   CBC with Differential/Platelet (Completed)     Digestive   GERD (gastroesophageal reflux disease)   Relevant Orders   CBC with Differential/Platelet (Completed)     Endocrine   Hyperlipidemia associated with type 2 diabetes mellitus ([place])   Relevant Orders   Lipid panel (Completed)   Type 2 diabetes mellitus without complications ([place]) - Primary    Continue current medications we will check blood work and labs today.  Patient  is not due for an INR check today yet  Follow up plan: Return in about 3 months (around 02/20/2019), or if symptoms worsen or fail to improve, for Diabetes hypertension cholesterol.  Counseling provided for all of the vaccine components Orders Placed This Encounter  Procedures  . CBC with Differential/Platelet  . CMP14+EGFR  . Lipid panel  . [facility] Hb A1c Waived    Caryl Pina, MD [facility] 11/21/2018, 8:43 AM

## 2018-11-22 ENCOUNTER — Ambulatory Visit (INDEPENDENT_AMBULATORY_CARE_PROVIDER_SITE_OTHER): Payer: Medicare Other | Admitting: Urology

## 2018-11-22 DIAGNOSIS — C61 Malignant neoplasm of prostate: Secondary | ICD-10-CM

## 2018-11-22 DIAGNOSIS — N401 Enlarged prostate with lower urinary tract symptoms: Secondary | ICD-10-CM | POA: Diagnosis not present

## 2018-11-22 LAB — BAYER DCA HB A1C WAIVED: HB A1C: 6.1 % (ref <7.0)

## 2018-12-10 ENCOUNTER — Other Ambulatory Visit: Payer: Self-pay | Admitting: Family Medicine

## 2018-12-18 ENCOUNTER — Ambulatory Visit (INDEPENDENT_AMBULATORY_CARE_PROVIDER_SITE_OTHER): Payer: Medicare Other | Admitting: Pharmacist Clinician (PhC)/ Clinical Pharmacy Specialist

## 2018-12-18 DIAGNOSIS — Z86718 Personal history of other venous thrombosis and embolism: Secondary | ICD-10-CM | POA: Diagnosis not present

## 2018-12-18 DIAGNOSIS — Z7901 Long term (current) use of anticoagulants: Secondary | ICD-10-CM

## 2018-12-18 DIAGNOSIS — D6859 Other primary thrombophilia: Secondary | ICD-10-CM

## 2018-12-18 LAB — COAGUCHEK XS/INR WAIVED
INR: 2.9 — AB (ref 0.9–1.1)
Prothrombin Time: 34.5 s

## 2018-12-18 NOTE — Patient Instructions (Signed)
Description   Continue same dose:  take 1 1/2 tablets every day of the week except for Tuesdays and Thursdays take 1 tablet  INR was 2.7  (goal 2-3) Perfect reading

## 2018-12-24 ENCOUNTER — Other Ambulatory Visit: Payer: Self-pay | Admitting: Family Medicine

## 2019-01-15 ENCOUNTER — Other Ambulatory Visit: Payer: Self-pay | Admitting: Interventional Cardiology

## 2019-01-15 DIAGNOSIS — I10 Essential (primary) hypertension: Secondary | ICD-10-CM

## 2019-01-29 ENCOUNTER — Ambulatory Visit (INDEPENDENT_AMBULATORY_CARE_PROVIDER_SITE_OTHER): Payer: Medicare Other | Admitting: Pharmacist Clinician (PhC)/ Clinical Pharmacy Specialist

## 2019-01-29 ENCOUNTER — Encounter: Payer: Self-pay | Admitting: Pharmacist Clinician (PhC)/ Clinical Pharmacy Specialist

## 2019-01-29 DIAGNOSIS — Z7901 Long term (current) use of anticoagulants: Secondary | ICD-10-CM | POA: Diagnosis not present

## 2019-01-29 DIAGNOSIS — D6859 Other primary thrombophilia: Secondary | ICD-10-CM | POA: Diagnosis not present

## 2019-01-29 DIAGNOSIS — Z86718 Personal history of other venous thrombosis and embolism: Secondary | ICD-10-CM | POA: Diagnosis not present

## 2019-01-29 LAB — COAGUCHEK XS/INR WAIVED
INR: 2.2 — ABNORMAL HIGH (ref 0.9–1.1)
Prothrombin Time: 26.4 s

## 2019-02-07 ENCOUNTER — Encounter: Payer: Self-pay | Admitting: *Deleted

## 2019-02-24 ENCOUNTER — Ambulatory Visit (INDEPENDENT_AMBULATORY_CARE_PROVIDER_SITE_OTHER): Payer: Medicare Other | Admitting: Family Medicine

## 2019-02-24 ENCOUNTER — Other Ambulatory Visit: Payer: Self-pay

## 2019-02-24 ENCOUNTER — Encounter: Payer: Self-pay | Admitting: Family Medicine

## 2019-02-24 VITALS — BP 141/60 | HR 71 | Temp 97.6°F | Ht 65.0 in | Wt 156.4 lb

## 2019-02-24 DIAGNOSIS — D6859 Other primary thrombophilia: Secondary | ICD-10-CM | POA: Diagnosis not present

## 2019-02-24 DIAGNOSIS — K219 Gastro-esophageal reflux disease without esophagitis: Secondary | ICD-10-CM

## 2019-02-24 DIAGNOSIS — E1169 Type 2 diabetes mellitus with other specified complication: Secondary | ICD-10-CM | POA: Diagnosis not present

## 2019-02-24 DIAGNOSIS — E1159 Type 2 diabetes mellitus with other circulatory complications: Secondary | ICD-10-CM

## 2019-02-24 DIAGNOSIS — I152 Hypertension secondary to endocrine disorders: Secondary | ICD-10-CM

## 2019-02-24 DIAGNOSIS — I10 Essential (primary) hypertension: Secondary | ICD-10-CM

## 2019-02-24 DIAGNOSIS — E119 Type 2 diabetes mellitus without complications: Secondary | ICD-10-CM

## 2019-02-24 DIAGNOSIS — E785 Hyperlipidemia, unspecified: Secondary | ICD-10-CM

## 2019-02-24 LAB — CBC WITH DIFFERENTIAL/PLATELET
Basophils Absolute: 0 10*3/uL (ref 0.0–0.2)
Basos: 0 %
EOS (ABSOLUTE): 0.1 10*3/uL (ref 0.0–0.4)
EOS: 3 %
HEMATOCRIT: 32.6 % — AB (ref 37.5–51.0)
HEMOGLOBIN: 11.5 g/dL — AB (ref 13.0–17.7)
IMMATURE GRANULOCYTES: 0 %
Immature Grans (Abs): 0 10*3/uL (ref 0.0–0.1)
LYMPHS ABS: 1.1 10*3/uL (ref 0.7–3.1)
Lymphs: 25 %
MCH: 31 pg (ref 26.6–33.0)
MCHC: 35.3 g/dL (ref 31.5–35.7)
MCV: 88 fL (ref 79–97)
MONOCYTES: 10 %
Monocytes Absolute: 0.5 10*3/uL (ref 0.1–0.9)
Neutrophils Absolute: 2.8 10*3/uL (ref 1.4–7.0)
Neutrophils: 62 %
Platelets: 220 10*3/uL (ref 150–450)
RBC: 3.71 x10E6/uL — AB (ref 4.14–5.80)
RDW: 13.5 % (ref 11.6–15.4)
WBC: 4.5 10*3/uL (ref 3.4–10.8)

## 2019-02-24 LAB — CMP14+EGFR
ALBUMIN: 4 g/dL (ref 3.7–4.7)
ALT: 10 IU/L (ref 0–44)
AST: 19 IU/L (ref 0–40)
Albumin/Globulin Ratio: 1.7 (ref 1.2–2.2)
Alkaline Phosphatase: 50 IU/L (ref 39–117)
BUN / CREAT RATIO: 16 (ref 10–24)
BUN: 15 mg/dL (ref 8–27)
Bilirubin Total: 0.8 mg/dL (ref 0.0–1.2)
CALCIUM: 10.1 mg/dL (ref 8.6–10.2)
CO2: 24 mmol/L (ref 20–29)
CREATININE: 0.94 mg/dL (ref 0.76–1.27)
Chloride: 100 mmol/L (ref 96–106)
GFR, EST AFRICAN AMERICAN: 91 mL/min/{1.73_m2} (ref >59)
GFR, EST NON AFRICAN AMERICAN: 78 mL/min/{1.73_m2} (ref >59)
GLOBULIN, TOTAL: 2.4 g/dL (ref 1.5–4.5)
Glucose: 118 mg/dL — ABNORMAL HIGH (ref 65–99)
Potassium: 3.3 mmol/L — ABNORMAL LOW (ref 3.5–5.2)
SODIUM: 142 mmol/L (ref 134–144)
Total Protein: 6.4 g/dL (ref 6.0–8.5)

## 2019-02-24 LAB — LIPID PANEL
CHOL/HDL RATIO: 3 ratio (ref 0.0–5.0)
Cholesterol, Total: 95 mg/dL — ABNORMAL LOW (ref 100–199)
HDL: 32 mg/dL — ABNORMAL LOW (ref >39)
LDL CALC: 43 mg/dL (ref 0–99)
Triglycerides: 98 mg/dL (ref 0–149)
VLDL Cholesterol Cal: 20 mg/dL (ref 5–40)

## 2019-02-24 LAB — COAGUCHEK XS/INR WAIVED
INR: 2.9 — ABNORMAL HIGH (ref 0.9–1.1)
Prothrombin Time: 35 s

## 2019-02-24 LAB — BAYER DCA HB A1C WAIVED: HB A1C (BAYER DCA - WAIVED): 5.9 % (ref <7.0)

## 2019-02-24 MED ORDER — WARFARIN SODIUM 5 MG PO TABS: ORAL_TABLET | ORAL | 1 refills | Status: DC

## 2019-02-24 MED ORDER — LISINOPRIL 20 MG PO TABS: 30.0000 mg | ORAL_TABLET | Freq: Every day | ORAL | 3 refills | Status: DC

## 2019-02-24 NOTE — Progress Notes (Signed)
BP (!) 141/60   Pulse 71   Temp 97.6 F (36.4 C) (Oral)   Ht _0  (1.651 m)   Wt 156 lb 6.4 oz (70.9 kg)   BMI 26.03 kg/m    Subjective:    Patient ID: Alan Netters., male    DOB: 1941-01-27, 78 y.o.   MRN: [id number]  HPI: Alan Pullin. is a 78 y.o. male presenting on 02/24/2019 for Hyperlipidemia (3 month follow up); Hypertension; and Diabetes   HPI Type 2 diabetes mellitus Patient comes in today for recheck of his diabetes. Patient has been currently taking metformin. Patient is currently on an ACE inhibitor/ARB. Patient has not seen an ophthalmologist this year. Patient denies any issues with their feet.   Hyperlipidemia Patient is coming in for recheck of his hyperlipidemia. The patient is currently taking omega-3's and pravastatin. They deny any issues with myalgias or history of liver damage from it. They deny any focal numbness or weakness or chest pain.   Hypertension Patient is currently on lisinopril and metoprolol and amlodipine and chlorthalidone, and their blood pressure today is 141/60. Patient denies any lightheadedness or dizziness. Patient denies headaches, blurred vision, chest pains, shortness of breath, or weakness. Denies any side effects from medication and is content with current medication.   GERD Patient is currently on omeprazole.  She denies any major symptoms or abdominal pain or belching or burping. She denies any blood in her stool or lightheadedness or dizziness.   Primary hypercoagulable state and INR Patient is coming in for Coumadin recheck and denies any major bleeding or bruising episodes.  Alan [name] denies any shortness of breath or wheezing.  Alan [name] has a history of hypercoagulable state with clots.  Alan [name] is slotted to be on Coumadin the rest of his life.  Relevant past medical, surgical, family and social history reviewed and updated as indicated. Interim medical history since our last visit reviewed. Allergies and medications reviewed and updated.   Review of Systems  Constitutional: Negative for chills and fever.  Eyes: Negative for visual disturbance.  Respiratory: Negative for shortness of breath and wheezing.   Cardiovascular: Negative for chest pain and leg swelling.  Musculoskeletal: Negative for back pain and gait problem.  Skin: Negative for rash.  Neurological: Negative for dizziness, weakness, light-headedness and numbness.  All other systems reviewed and are negative.   Per HPI unless specifically indicated above   Allergies as of 02/24/2019      Reactions   Allopurinol Other (See Comments)   Significantly decreased WBC's   Colchicine Other (See Comments)   Significantly decreased WBC's      Medication List       Accurate as of February 24, 2019  9:40 AM. Always use your most recent med list.        amLODipine 10 MG tablet Commonly known as:  NORVASC Take 1 tablet (10 mg total) by mouth every evening.   chlorthalidone 25 MG tablet Commonly known as:  HYGROTON Take 1 tablet (25 mg total) by mouth daily.   finasteride 5 MG tablet Commonly known as:  PROSCAR Take 5 mg by mouth daily.   Fish Oil 1000 MG Caps Take 2 capsules by mouth 2 (two) times daily.   fluticasone 50 MCG/ACT nasal spray Commonly known as:  FLONASE Place 1 spray into both nostrils 2 (two) times daily as needed for allergies or rhinitis.   glucose blood test strip Commonly known as:  ONE TOUCH ULTRA TEST Use to check BG once  daily.  Dx:  Type 2 DM controlled E11.9   Iron 325 (65 Fe) MG Tabs TAKE 1 TABLET BY MOUTH ONCE DAILY WITH BREAKFAST   lisinopril 20 MG tablet Commonly known as:  PRINIVIL,ZESTRIL Take 1.5 tablets (30 mg total) by mouth daily. Please make yearly appt with Dr. Tamala Julian for March before anymore refills. 1st attempt   metFORMIN 1000 MG tablet Commonly known as:  GLUCOPHAGE TAKE 1 TABLET BY MOUTH TWICE DAILY WITH A MEAL   metoprolol succinate 100 MG 24 hr tablet Commonly known as:  TOPROL-XL Take 1 tablet (100 mg  total) by mouth daily. Take with or immediately following a meal.   omeprazole 20 MG capsule Commonly known as:  PRILOSEC Take 1 capsule (20 mg total) by mouth daily.   potassium chloride SA 20 MEQ tablet Commonly known as:  K-DUR,KLOR-CON Take 1 tablet (20 mEq total) by mouth daily.   pravastatin 40 MG tablet Commonly known as:  PRAVACHOL Take 1 tablet (40 mg total) by mouth every evening.   warfarin 5 MG tablet Commonly known as:  COUMADIN Take as directed by the anticoagulation clinic. If you are unsure how to take this medication, talk to your nurse or doctor. Original instructions:  TAKE 1 & 1/2 (ONE & ONE-HALF) TABLETS BY MOUTH ONCE DAILY AT 6 IN THE EVENING          Objective:    BP (!) 141/60   Pulse 71   Temp 97.6 F (36.4 C) (Oral)   Ht _0  (1.651 m)   Wt 156 lb 6.4 oz (70.9 kg)   BMI 26.03 kg/m   Wt Readings from Last 3 Encounters:  02/24/19 156 lb 6.4 oz (70.9 kg)  11/21/18 159 lb 6.4 oz (72.3 kg)  08/21/18 162 lb 3.2 oz (73.6 kg)    Physical Exam Vitals signs and nursing note reviewed.  Constitutional:      General: Alan [name] is not in acute distress.    Appearance: Alan [name] is well-developed. Alan [name] is not diaphoretic.  Eyes:     General: No scleral icterus.    Conjunctiva/sclera: Conjunctivae normal.  Neck:     Musculoskeletal: Neck supple.     Thyroid: No thyromegaly.  Cardiovascular:     Rate and Rhythm: Normal rate and regular rhythm.     Heart sounds: Normal heart sounds. No murmur.  Pulmonary:     Effort: Pulmonary effort is normal. No respiratory distress.     Breath sounds: Normal breath sounds. No wheezing.  Musculoskeletal: Normal range of motion.  Lymphadenopathy:     Cervical: No cervical adenopathy.  Skin:    General: Skin is warm and dry.     Findings: No rash.  Neurological:     Mental Status: Alan [name] is alert and oriented to person, place, and time.     Coordination: Coordination normal.  Psychiatric:        Behavior: Behavior normal.      Description   Continue same dose:  take 1 1/2 tablets every day of the week except for Tuesdays and Thursdays take 1 tablet  INR was 2.9 (goal 2-3) Perfect reading        Assessment & Plan:   Problem List Items Addressed This Visit      Cardiovascular and Mediastinum   Hypertension associated with diabetes ([place])   Relevant Medications   lisinopril (PRINIVIL,ZESTRIL) 20 MG tablet   warfarin (COUMADIN) 5 MG tablet   Other Relevant Orders   CMP14+EGFR     Digestive  GERD (gastroesophageal reflux disease)   Relevant Orders   CBC with Differential/Platelet     Endocrine   Hyperlipidemia associated with type 2 diabetes mellitus ([place])   Relevant Medications   lisinopril (PRINIVIL,ZESTRIL) 20 MG tablet   Other Relevant Orders   Lipid panel   Type 2 diabetes mellitus without complications ([place]) - Primary   Relevant Medications   lisinopril (PRINIVIL,ZESTRIL) 20 MG tablet   Other Relevant Orders   Bayer DCA Hb A1c Waived     Hematopoietic and Hemostatic   Primary hypercoagulable state ([place]) [D68.59]   Relevant Medications   warfarin (COUMADIN) 5 MG tablet   Other Relevant Orders   CoaguChek XS/INR Waived    Other Visit Diagnoses    Essential hypertension       Relevant Medications   lisinopril (PRINIVIL,ZESTRIL) 20 MG tablet   warfarin (COUMADIN) 5 MG tablet         Follow up plan: Return in about 4 weeks (around 03/24/2019), or if symptoms worsen or fail to improve, for INR recheck with me or Sharyn Lull.  Counseling provided for all of the vaccine components Orders Placed This Encounter  Procedures  . CoaguChek XS/INR Waived  . Bayer DCA Hb A1c Waived  . CBC with Differential/Platelet  . CMP14+EGFR  . Lipid panel    Caryl Pina, MD [facility] 02/24/2019, 9:40 AM

## 2019-03-12 ENCOUNTER — Encounter: Payer: Self-pay | Admitting: Pharmacist Clinician (PhC)/ Clinical Pharmacy Specialist

## 2019-03-19 ENCOUNTER — Encounter: Payer: Self-pay | Admitting: Pharmacist Clinician (PhC)/ Clinical Pharmacy Specialist

## 2019-04-01 ENCOUNTER — Other Ambulatory Visit: Payer: Self-pay | Admitting: Interventional Cardiology

## 2019-04-02 ENCOUNTER — Ambulatory Visit: Payer: Medicare Other | Admitting: Pharmacist Clinician (PhC)/ Clinical Pharmacy Specialist

## 2019-04-02 ENCOUNTER — Other Ambulatory Visit: Payer: Self-pay

## 2019-04-02 DIAGNOSIS — Z7901 Long term (current) use of anticoagulants: Secondary | ICD-10-CM

## 2019-04-02 DIAGNOSIS — D6859 Other primary thrombophilia: Secondary | ICD-10-CM | POA: Diagnosis not present

## 2019-04-02 DIAGNOSIS — Z86718 Personal history of other venous thrombosis and embolism: Secondary | ICD-10-CM | POA: Diagnosis not present

## 2019-04-02 LAB — COAGUCHEK XS/INR WAIVED
INR: 1.2 — ABNORMAL HIGH (ref 0.9–1.1)
Prothrombin Time: 14.8 s

## 2019-04-02 NOTE — Patient Instructions (Addendum)
Description   Take 2 tablets today then  take 1 1/2 tablets every day of the week except for Tuesdays and Thursdays take 1 tablet  INR was 1.2 (goal 2-3) too thick

## 2019-04-03 ENCOUNTER — Other Ambulatory Visit: Payer: Self-pay | Admitting: Interventional Cardiology

## 2019-04-03 MED ORDER — CHLORTHALIDONE 25 MG PO TABS: 25.0000 mg | ORAL_TABLET | Freq: Every day | ORAL | 0 refills | Status: DC

## 2019-04-04 ENCOUNTER — Other Ambulatory Visit: Payer: Self-pay | Admitting: Family Medicine

## 2019-04-09 ENCOUNTER — Ambulatory Visit (INDEPENDENT_AMBULATORY_CARE_PROVIDER_SITE_OTHER): Payer: Medicare Other | Admitting: Pharmacist Clinician (PhC)/ Clinical Pharmacy Specialist

## 2019-04-09 ENCOUNTER — Other Ambulatory Visit: Payer: Self-pay

## 2019-04-09 ENCOUNTER — Ambulatory Visit (INDEPENDENT_AMBULATORY_CARE_PROVIDER_SITE_OTHER): Payer: Medicare Other | Admitting: *Deleted

## 2019-04-09 VITALS — Ht 65.0 in | Wt 162.0 lb

## 2019-04-09 DIAGNOSIS — Z7901 Long term (current) use of anticoagulants: Secondary | ICD-10-CM | POA: Diagnosis not present

## 2019-04-09 DIAGNOSIS — Z86718 Personal history of other venous thrombosis and embolism: Secondary | ICD-10-CM

## 2019-04-09 DIAGNOSIS — Z0000 Encounter for general adult medical examination without abnormal findings: Secondary | ICD-10-CM

## 2019-04-09 DIAGNOSIS — D6859 Other primary thrombophilia: Secondary | ICD-10-CM | POA: Diagnosis not present

## 2019-04-09 LAB — COAGUCHEK XS/INR WAIVED
INR: 2.2 — ABNORMAL HIGH (ref 0.9–1.1)
Prothrombin Time: 26.4 s

## 2019-04-09 NOTE — Patient Instructions (Signed)
Description   Continue to take 1 tablet every day of the week except for Mondays, Wednesdays, and Saturdays take 1 1/2 tablets  INR was 2.2 (goal 2-3) Perfect reading!

## 2019-04-09 NOTE — Patient Instructions (Addendum)
Alan [name] , Thank you for taking time to come for your Medicare Wellness Visit. I appreciate your ongoing commitment to your health goals. Please review the following plan we discussed and let me know if I can assist you in the future.   These are the goals we discussed: Goals    . Exercise 3x per week (30 min per time)       This is a list of the screening recommended for you and due dates:  Health Maintenance  Topic Date Due  . Eye exam for diabetics  03/07/2019  . Colon Cancer Screening  04/08/2020*  . Complete foot exam   05/21/2019  . Flu Shot  07/12/2019  . Hemoglobin A1C  08/27/2019  . Tetanus Vaccine  01/10/2023  . Pneumonia vaccines  Completed  *Topic was postponed. The date shown is not the original due date.     Advance Directive  Advance directives are legal documents that let you make choices ahead of time about your health care and medical treatment in case you become unable to communicate for yourself. Advance directives are a way for you to communicate your wishes to family, friends, and health care providers. This can help convey your decisions about end-of-life care if you become unable to communicate. Discussing and writing advance directives should happen over time rather than all at once. Advance directives can be changed depending on your situation and what you want, even after you have signed the advance directives. If you do not have an advance directive, some states assign family decision makers to act on your behalf based on how closely you are related to them. Each state has its own laws regarding advance directives. You may want to check with your health care provider, attorney, or state representative about the laws in your state. There are different types of advance directives, such as:  Medical power of attorney.  Living will.  Do not resuscitate (DNR) or do not attempt resuscitation (DNAR) order. Health care proxy and medical power of attorney A  health care proxy, also called a health care agent, is a person who is appointed to make medical decisions for you in cases in which you are unable to make the decisions yourself. Generally, people choose someone they know well and trust to represent their preferences. Make sure to ask this person for an agreement to act as your proxy. A proxy may have to exercise judgment in the event of a medical decision for which your wishes are not known. A medical power of attorney is a legal document that names your health care proxy. Depending on the laws in your state, after the document is written, it may also need to be:  Signed.  Notarized.  Dated.  Copied.  Witnessed.  Incorporated into your medical record. You may also want to appoint someone to manage your financial affairs in a situation in which you are unable to do so. This is called a durable power of attorney for finances. It is a separate legal document from the durable power of attorney for health care. You may choose the same person or someone different from your health care proxy to act as your agent in financial matters. If you do not appoint a proxy, or if there is a concern that the proxy is not acting in your best interests, a court-appointed guardian may be designated to act on your behalf. Living will A living will is a set of instructions documenting your wishes about medical care  when you cannot express them yourself. Health care providers should keep a copy of your living will in your medical record. You may want to give a copy to family members or friends. To alert caregivers in case of an emergency, you can place a card in your wallet to let them know that you have a living will and where they can find it. A living will is used if you become:  Terminally ill.  Incapacitated.  Unable to communicate or make decisions. Items to consider in your living will include:  The use or non-use of life-sustaining equipment, such as  dialysis machines and breathing machines (ventilators).  A DNR or DNAR order, which is the instruction not to use cardiopulmonary resuscitation (CPR) if breathing or heartbeat stops.  The use or non-use of tube feeding.  Withholding of food and fluids.  Comfort (palliative) care when the goal becomes comfort rather than a cure.  Organ and tissue donation. A living will does not give instructions for distributing your money and property if you should pass away. It is recommended that you seek the advice of a lawyer when writing a will. Decisions about taxes, beneficiaries, and asset distribution will be legally binding. This process can relieve your family and friends of any concerns surrounding disputes or questions that may come up about the distribution of your assets. DNR or DNAR A DNR or DNAR order is a request not to have CPR in the event that your heart stops beating or you stop breathing. If a DNR or DNAR order has not been made and shared, a health care provider will try to help any patient whose heart has stopped or who has stopped breathing. If you plan to have surgery, talk with your health care provider about how your DNR or DNAR order will be followed if problems occur. Summary  Advance directives are the legal documents that allow you to make choices ahead of time about your health care and medical treatment in case you become unable to communicate for yourself.  The process of discussing and writing advance directives should happen over time. You can change the advance directives, even after you have signed them.  Advance directives include DNR or DNAR orders, living wills, and designating an agent as your medical power of attorney. This information is not intended to replace advice given to you by your health care provider. Make sure you discuss any questions you have with your health care provider. Document Released: 03/05/2008 Document Revised: 10/16/2016 Document Reviewed:  10/16/2016 Elsevier Interactive Patient Education  2019 [employer].

## 2019-04-09 NOTE — Progress Notes (Signed)
MEDICARE ANNUAL WELLNESS VISIT  04/09/2019  Telephone Visit Disclaimer This Medicare AWV was conducted by telephone due to national recommendations for restrictions regarding the COVID-19 Pandemic (e.g. social distancing).  I verified, using two identifiers, that I am speaking with Alan [name]. or their authorized healthcare agent. I discussed the limitations, risks, security, and privacy concerns of performing an evaluation and management service by telephone and the potential availability of an in-person appointment in the future. The patient expressed understanding and agreed to proceed.   Subjective:  Alan [place]. is a 78 y.o. male patient of Dettinger, Fransisca Kaufmann, MD who had a Medicare Annual Wellness Visit today via telephone. Alan [name] is Retired and lives with their spouse. he has 2 living and 2 passed children. he reports that he is socially active and does interact with friends/family regularly. he is minimally physically active and enjoys music.  Patient Care Team: Dettinger, Fransisca Kaufmann, MD as PCP - General (Family Medicine) Belva Crome, MD as PCP - Cardiology (Cardiology) Gala Romney Cristopher Estimable, MD as Consulting Physician (Gastroenterology) Irine Seal, MD as Attending Physician (Urology) Dickie La, RN (Inactive) as [facility] Management  Advanced Directives 03/04/2018 01/03/2017 04/12/2016 04/09/2016 04/09/2016 11/26/2015 11/22/2015  Does Patient Have a Medical Advance Directive? No No No No No No No  Would patient like information on creating a medical advance directive? Yes (ED - Information included in AVS) Yes (MAU/Ambulatory/Procedural Areas - Information given) No - patient declined information No - patient declined information - No - patient declined information Yes - Educational materials given    Hospital Utilization Over the Past 12 Months: # of hospitalizations or ER visits: 0 # of surgeries: 0  Review of Systems    Patient reports that his overall  health is unchanged compared to last year.  Patient Reported Readings (BP, Pulse, CBG, Weight, etc) Weight 162.  CBG- 129  Review of Systems: History obtained from chart review General ROS: negative  All other systems negative.  Pain Assessment Pain : No/denies pain     Current Medications & Allergies (verified) Allergies as of 04/09/2019      Reactions   Allopurinol Other (See Comments)   Significantly decreased WBC's   Colchicine Other (See Comments)   Significantly decreased WBC's      Medication List       Accurate as of April 09, 2019 12:00 PM. Always use your most recent med list.        amLODipine 10 MG tablet Commonly known as:  NORVASC Take 1 tablet (10 mg total) by mouth every evening.   chlorthalidone 25 MG tablet Commonly known as:  HYGROTON Take 1 tablet (25 mg total) by mouth daily. Please make overdue appt with Dr. Tamala Julian before anymore refills. 1st attempt   finasteride 5 MG tablet Commonly known as:  PROSCAR Take 5 mg by mouth daily.   Fish Oil 1000 MG Caps Take 2 capsules by mouth 2 (two) times daily.   fluticasone 50 MCG/ACT nasal spray Commonly known as:  FLONASE Place 1 spray into both nostrils 2 (two) times daily as needed for allergies or rhinitis.   glucose blood test strip Commonly known as:  ONE TOUCH ULTRA TEST Use to check BG once daily.  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 Please make yearly appt with Dr. Tamala Julian for March before anymore refills. 1st attempt   metFORMIN 1000 MG tablet Commonly known as:  GLUCOPHAGE TAKE 1 TABLET BY MOUTH TWICE DAILY WITH A MEAL   metoprolol succinate 100 MG 24 hr tablet Commonly known as:  TOPROL-XL Take 1 tablet (100 mg total) by mouth daily. Take with or immediately following a meal.   omeprazole 20 MG capsule  Commonly known as:  PRILOSEC Take 1 capsule (20 mg total) by mouth daily.   potassium chloride SA 20 MEQ tablet Commonly known as:  K-DUR Take 1 tablet (20 mEq total) by mouth daily.   pravastatin 40 MG tablet Commonly known as:  PRAVACHOL Take 1 tablet (40 mg total) by mouth every evening.   warfarin 5 MG tablet Commonly known as:  COUMADIN Take as directed by the anticoagulation clinic. If you are unsure how to take this medication, talk to your nurse or doctor. Original instructions:  TAKE 1 & 1/2 (ONE & ONE-HALF) TABLETS BY MOUTH ONCE DAILY AT 6 IN THE EVENING       History (reviewed): Past Medical History:  Diagnosis Date  . Adenomatous colon polyp   . Agranulocytosis ([place])   . Allergy   . Atrial fibrillation ([place]) 04/19/2016  . Cataract    small  . Diabetes mellitus   . Diverticulosis   . ED (erectile dysfunction)   . 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 Gallstones   . GERD (gastroesophageal reflux disease)   . Gout   . Heart murmur   . Hyperlipidemia   . Hypertension   . Internal hemorrhoids   . Pancreatitis   . 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  . Prostate cancer ([place])    Followed by Dr. Jeffie Pollock    Past Surgical History:  Procedure Laterality Date  . CHOLECYSTECTOMY N/A 11/26/2015   Procedure: LAPAROSCOPIC CHOLECYSTECTOMY;  Surgeon: Aviva Signs, MD;  Location: [facility];  Service: General;  Laterality: N/A;  . COLONOSCOPY  2012   [name]: moderate sigmoid diverticulosis, internal hemorrhoids. next tcs 2017  . FLEXIBLE BRONCHOSCOPY Bilateral 04/14/2016   Procedure: FLEXIBLE BRONCHOSCOPY;  Surgeon: Sinda Du, MD;  Location: [facility];  Service: Cardiopulmonary;  Laterality: Bilateral;  . SIGMOIDOSCOPY    . UMBILICAL HERNIA REPAIR N/A 11/26/2015   Procedure: UMBILICAL HERNIORRHAPHY;  Surgeon: Aviva Signs, MD;  Location: [facility];  Service: General;  Laterality: N/A;   Family History  Problem Relation Age  of Onset  . Stroke Mother   . Hypertension Mother   . Alzheimer's disease Mother   . Hypertension Father   . Brain cancer Sister   . Breast cancer Sister   . Hypertension Sister   . Deep vein thrombosis Brother   . Hypertension Brother   . Colon cancer Neg Hx   . Pancreatic disease Neg Hx    Social History   Socioeconomic History  . Marital status: Married    Spouse name: Arville Go  . Number of children: 4  . Years of education: Not on file  . Highest education level: 10th grade  Occupational History  . Occupation: Retired     Fish farm manager: [employer]  Social Needs  . Financial resource strain: Not hard at all  . Food insecurity:    Worry: Never true    Inability: Never true  . Transportation needs:    Medical: No    Non-medical: No  Tobacco Use  . Smoking status:  Former Smoker    Types: Cigarettes    Last attempt to quit: 12/11/1986    Years since quitting: 32.3  . Smokeless tobacco: Never Used  . Tobacco comment: Quit x 50 years  Substance and Sexual Activity  . Alcohol use: No  . Drug use: No  . Sexual activity: Yes  Lifestyle  . Physical activity:    Days per week: 3 days    Minutes per session: 30 min  . Stress: Not at all  Relationships  . Social connections:    Talks on phone: More than three times a week    Gets together: More than three times a week    Attends religious service: More than 4 times per year    Active member of club or organization: Yes    Attends meetings of clubs or organizations: More than 4 times per year    Relationship status: Married  Other Topics Concern  . Not on file  Social History Narrative  . Not on file    Activities of Daily Living In your present state of health, do you have any difficulty performing the following activities: 04/09/2019  Hearing? N  Vision? N  Difficulty concentrating or making decisions? N  Walking or climbing stairs? N  Dressing or bathing? N  Doing errands, shopping? N  Some recent data might be  hidden    Patient Literacy How often do you need to have someone help you when you read instructions, pamphlets, or other written materials from your doctor or pharmacy?: 1 - Never What is the last grade level you completed in school?: 10th Grade  Exercise Current Exercise Habits: Home exercise routine, Type of exercise: walking, Time (Minutes): 30, Frequency (Times/Week): 3, Weekly Exercise (Minutes/Week): 90  Diet Patient reports consuming 3 meals a day and 2 snack(s) a day Patient reports that his primary diet is: Regular Patient reports that she does have regular access to food.   Depression Screen PHQ 2/9 Scores 04/09/2019 02/24/2019 11/21/2018 08/21/2018 06/24/2018 05/20/2018 03/04/2018  PHQ - 2 Score 0 0 0 0 0 0 0     Fall Risk Fall Risk  04/09/2019 04/09/2019 11/21/2018 08/21/2018 06/24/2018  Falls in the past year? 0 0 0 No No  Number falls in past yr: - 0 - - -  Injury with Fall? - 0 - - -  Comment - - - - -  Risk for fall due to : - - - - -  Risk for fall due to: Comment - - - - -     Objective:  Alan [name]. seemed alert and oriented and he participated appropriately during our telephone visit.  Blood Pressure Weight BMI  BP Readings from Last 3 Encounters:  02/24/19 (!) 141/60  11/21/18 (!) 145/64  08/21/18 (!) 140/53   Wt Readings from Last 3 Encounters:  04/09/19 162 lb (73.5 kg)  02/24/19 156 lb 6.4 oz (70.9 kg)  11/21/18 159 lb 6.4 oz (72.3 kg)   BMI Readings from Last 1 Encounters:  04/09/19 26.96 kg/m    *Unable to obtain current vital signs, weight, and BMI due to telephone visit type  Hearing/Vision  . Alan [name] did not seem to have difficulty with hearing/understanding during the telephone conversation . Reports that he has had a formal eye exam by an eye care professional within the past year . Reports that he has not had a formal hearing evaluation within the past year *Unable to fully assess hearing and vision during telephone visit type  Cognitive  Function: 6CIT Screen 04/09/2019  What Year? 0 points  What month? 0 points  What time? 0 points  Count back from 20 0 points  Months in reverse 0 points  Repeat phrase 0 points  Total Score 0    Normal Cognitive Function Screening: Yes (Normal:0-7, Significant for Dysfunction: >8)  Immunization & Health Maintenance Record Immunization History  Administered Date(s) Administered  . Influenza, High Dose Seasonal PF 09/18/2018  . Influenza,inj,Quad PF,6+ Mos 10/02/2013, 11/12/2014, 09/08/2015, 08/31/2016, 09/11/2017  . Pneumococcal Conjugate-13 03/08/2015  . Pneumococcal Polysaccharide-23 01/10/2013  . Tdap 01/10/2013    Health Maintenance  Topic Date Due  . OPHTHALMOLOGY EXAM  03/07/2019  . COLONOSCOPY  04/08/2020 (Originally 08/23/2016)  . FOOT EXAM  05/21/2019  . INFLUENZA VACCINE  07/12/2019  . HEMOGLOBIN A1C  08/27/2019  . TETANUS/TDAP  01/10/2023  . PNA vac Low Risk Adult  Completed       Assessment  This is a routine wellness examination for Alan [name].Marland Kitchen  Health Maintenance: Due or Overdue Health Maintenance Due  Topic Date Due  . OPHTHALMOLOGY EXAM  03/07/2019    Alan [name]. does not need a referral for Community Assistance: Care Management:   no Social Work:    no Prescription Assistance:  no Nutrition/Diabetes Education:  no   Plan:  Personalized Goals Goals Addressed   None    Personalized Health Maintenance & Screening Recommendations  Advanced directives: has NO advanced directive  - add't info requested. Referral to SW: no  Lung Cancer Screening Recommended: no (Low Dose CT Chest recommended if Age 55-80 years, 30 pack-year currently smoking OR have quit w/in past 15 years) Hepatitis C Screening recommended: no HIV Screening recommended: no  Advanced Directives: Written information was prepared per patient's request.  Referrals & Orders No orders of the defined types were placed in this encounter.   Follow-up Plan . Follow-up with  Dettinger, Fransisca Kaufmann, MD as planned . Schedule Thursday 05/29/2019 at 10:25am .    I have personally reviewed and noted the following in the patient's chart:   . Medical and social history . Use of alcohol, tobacco or illicit drugs  . Current medications and supplements . Functional ability and status . Nutritional status . Physical activity . Advanced directives . List of other physicians . Hospitalizations, surgeries, and ER visits in previous 12 months . Vitals . Screenings to include cognitive, depression, and falls . Referrals and appointments  In addition, I have reviewed and discussed with Alan [name]. certain preventive protocols, quality metrics, and best practice recommendations. A written personalized care plan for preventive services as well as general preventive health recommendations is available and can be mailed to the patient at his request.      Truett Mainland, LPN  signature  [phone]

## 2019-04-24 ENCOUNTER — Other Ambulatory Visit: Payer: Self-pay | Admitting: Family Medicine

## 2019-04-24 DIAGNOSIS — D6859 Other primary thrombophilia: Secondary | ICD-10-CM

## 2019-05-09 ENCOUNTER — Other Ambulatory Visit: Payer: Self-pay | Admitting: Family Medicine

## 2019-05-13 ENCOUNTER — Encounter: Payer: Medicare Other | Admitting: *Deleted

## 2019-05-28 ENCOUNTER — Encounter: Payer: Self-pay | Admitting: Pharmacist Clinician (PhC)/ Clinical Pharmacy Specialist

## 2019-05-29 ENCOUNTER — Other Ambulatory Visit: Payer: Self-pay

## 2019-05-29 ENCOUNTER — Ambulatory Visit (INDEPENDENT_AMBULATORY_CARE_PROVIDER_SITE_OTHER): Payer: Medicare Other | Admitting: Family Medicine

## 2019-05-29 ENCOUNTER — Encounter: Payer: Self-pay | Admitting: Family Medicine

## 2019-05-29 VITALS — BP 152/61 | HR 53 | Temp 97.6°F | Ht 65.0 in | Wt 156.4 lb

## 2019-05-29 DIAGNOSIS — E1159 Type 2 diabetes mellitus with other circulatory complications: Secondary | ICD-10-CM | POA: Diagnosis not present

## 2019-05-29 DIAGNOSIS — I480 Paroxysmal atrial fibrillation: Secondary | ICD-10-CM

## 2019-05-29 DIAGNOSIS — I10 Essential (primary) hypertension: Secondary | ICD-10-CM | POA: Diagnosis not present

## 2019-05-29 DIAGNOSIS — E119 Type 2 diabetes mellitus without complications: Secondary | ICD-10-CM

## 2019-05-29 DIAGNOSIS — I152 Hypertension secondary to endocrine disorders: Secondary | ICD-10-CM

## 2019-05-29 DIAGNOSIS — E785 Hyperlipidemia, unspecified: Secondary | ICD-10-CM

## 2019-05-29 DIAGNOSIS — Z7901 Long term (current) use of anticoagulants: Secondary | ICD-10-CM | POA: Diagnosis not present

## 2019-05-29 DIAGNOSIS — E1169 Type 2 diabetes mellitus with other specified complication: Secondary | ICD-10-CM | POA: Diagnosis not present

## 2019-05-29 DIAGNOSIS — K219 Gastro-esophageal reflux disease without esophagitis: Secondary | ICD-10-CM

## 2019-05-29 DIAGNOSIS — Z86718 Personal history of other venous thrombosis and embolism: Secondary | ICD-10-CM

## 2019-05-29 DIAGNOSIS — D6859 Other primary thrombophilia: Secondary | ICD-10-CM

## 2019-05-29 LAB — BAYER DCA HB A1C WAIVED: HB A1C (BAYER DCA - WAIVED): 5.9 % (ref <7.0)

## 2019-05-29 LAB — COAGUCHEK XS/INR WAIVED
INR: 2.5 — ABNORMAL HIGH (ref 0.9–1.1)
Prothrombin Time: 30.4 s

## 2019-05-29 MED ORDER — LISINOPRIL 20 MG PO TABS: 30.0000 mg | ORAL_TABLET | Freq: Every day | ORAL | 3 refills | Status: DC

## 2019-05-29 MED ORDER — POTASSIUM CHLORIDE CRYS ER 20 MEQ PO TBCR: 20.0000 meq | EXTENDED_RELEASE_TABLET | Freq: Every day | ORAL | 3 refills | Status: DC

## 2019-05-29 MED ORDER — AMLODIPINE BESYLATE 10 MG PO TABS: 10.0000 mg | ORAL_TABLET | Freq: Every evening | ORAL | 3 refills | Status: DC

## 2019-05-29 MED ORDER — METOPROLOL SUCCINATE ER 100 MG PO TB24: 100.0000 mg | ORAL_TABLET | Freq: Every day | ORAL | 3 refills | Status: DC

## 2019-05-29 MED ORDER — FINASTERIDE 5 MG PO TABS: 5.0000 mg | ORAL_TABLET | Freq: Every day | ORAL | 3 refills | Status: DC

## 2019-05-29 MED ORDER — PRAVASTATIN SODIUM 40 MG PO TABS: 40.0000 mg | ORAL_TABLET | Freq: Every evening | ORAL | 3 refills | Status: DC

## 2019-05-29 MED ORDER — METFORMIN HCL 1000 MG PO TABS: ORAL_TABLET | ORAL | 3 refills | Status: DC

## 2019-05-29 MED ORDER — OMEPRAZOLE 20 MG PO CPDR: 20.0000 mg | DELAYED_RELEASE_CAPSULE | Freq: Every day | ORAL | 3 refills | Status: DC

## 2019-05-29 NOTE — Progress Notes (Signed)
BP (!) 152/61   Pulse (!) 53   Temp 97.6 F (36.4 C) (Oral)   Ht 5\' 5"  (1.651 m)   Wt 156 lb 6.4 oz (70.9 kg)   BMI 26.03 kg/m    Subjective:   Patient ID: Alan Netters., male    DOB: 07-24-1941, 78 y.o.   MRN: [id number]  HPI: Alan Keats. is a 78 y.o. male presenting on 05/29/2019 for Coagulation Disorder and Diabetes (check up of chronic medical conditions)   HPI Type 2 diabetes mellitus Patient comes in today for recheck of his diabetes. Patient has been currently taking metformin. Patient is currently on an ACE inhibitor/ARB. Patient has not seen an ophthalmologist this year. Patient denies any issues with their feet.   Hypertension Patient is currently on amlodipine and chlorthalidone and metoprolol, and their blood pressure today is 152/61. Patient denies any lightheadedness or dizziness. Patient denies headaches, blurred vision, chest pains, shortness of breath, or weakness. Denies any side effects from medication and is content with current medication.   A. fib and Coumadin recheck Patient is coming in for Coumadin recheck today.  Patient says everything is going well and denies any bruising or bleeding.  Hyperlipidemia Patient is coming in for recheck of his hyperlipidemia. The patient is currently taking pravastatin and fish oils. They deny any issues with myalgias or history of liver damage from it. They deny any focal numbness or weakness or chest pain.   GERD Patient is currently on omeprazole.  She denies any major symptoms or abdominal pain or belching or burping. She denies any blood in her stool or lightheadedness or dizziness.   Relevant past medical, surgical, family and social history reviewed and updated as indicated. Interim medical history since our last visit reviewed. Allergies and medications reviewed and updated.  Review of Systems  Constitutional: Negative for chills and fever.  Eyes: Negative for visual disturbance.  Respiratory: Negative for  shortness of breath and wheezing.   Cardiovascular: Negative for chest pain and leg swelling.  Musculoskeletal: Negative for back pain and gait problem.  Skin: Negative for rash.  Neurological: Negative for dizziness, weakness and light-headedness.  All other systems reviewed and are negative.   Per HPI unless specifically indicated above   Allergies as of 05/29/2019      Reactions   Allopurinol Other (See Comments)   Significantly decreased WBC's   Colchicine Other (See Comments)   Significantly decreased WBC's      Medication List       Accurate as of May 29, 2019 10:58 AM. If you have any questions, ask your nurse or doctor.        amLODipine 10 MG tablet Commonly known as: NORVASC Take 1 tablet (10 mg total) by mouth every evening.   chlorthalidone 25 MG tablet Commonly known as: HYGROTON Take 1 tablet (25 mg total) by mouth daily. Please make overdue appt with Dr. Tamala Julian before anymore refills. 1st attempt   finasteride 5 MG tablet Commonly known as: PROSCAR Take 5 mg by mouth daily.   Fish Oil 1000 MG Caps Take 2 capsules by mouth 2 (two) times daily.   fluticasone 50 MCG/ACT nasal spray Commonly known as: FLONASE Place 1 spray into both nostrils 2 (two) times daily as needed for allergies or rhinitis.   glucose blood test strip Commonly known as: ONE TOUCH ULTRA TEST Use to check BG once daily.  Dx:  Type 2 DM controlled E11.9   Iron 325 (65 Fe) MG Tabs  Take 1 tablet by mouth once daily with breakfast   lisinopril 20 MG tablet Commonly known as: ZESTRIL Take 1.5 tablets (30 mg total) by mouth daily. Please make yearly appt with Dr. Tamala Julian for March before anymore refills. 1st attempt   metFORMIN 1000 MG tablet Commonly known as: GLUCOPHAGE TAKE 1 TABLET BY MOUTH TWICE DAILY WITH A MEAL   metoprolol succinate 100 MG 24 hr tablet Commonly known as: TOPROL-XL Take 1 tablet (100 mg total) by mouth daily. Take with or immediately following a meal.    omeprazole 20 MG capsule Commonly known as: PRILOSEC Take 1 capsule by mouth once daily   potassium chloride SA 20 MEQ tablet Commonly known as: K-DUR Take 1 tablet (20 mEq total) by mouth daily.   pravastatin 40 MG tablet Commonly known as: PRAVACHOL Take 1 tablet (40 mg total) by mouth every evening.   warfarin 5 MG tablet Commonly known as: COUMADIN Take as directed by the anticoagulation clinic. If you are unsure how to take this medication, talk to your nurse or doctor. Original instructions: TAKE 1 & 1/2 (ONE & ONE-HALF) TABLETS BY MOUTH ONCE DAILY AT 6 IN THE EVENING        Objective:   BP (!) 152/61   Pulse (!) 53   Temp 97.6 F (36.4 C) (Oral)   Ht 5\' 5"  (1.651 m)   Wt 156 lb 6.4 oz (70.9 kg)   BMI 26.03 kg/m   Wt Readings from Last 3 Encounters:  05/29/19 156 lb 6.4 oz (70.9 kg)  04/09/19 162 lb (73.5 kg)  02/24/19 156 lb 6.4 oz (70.9 kg)    Physical Exam Vitals signs and nursing note reviewed.  Constitutional:      General: He is not in acute distress.    Appearance: He is well-developed. He is not diaphoretic.  Eyes:     General: No scleral icterus.    Conjunctiva/sclera: Conjunctivae normal.  Neck:     Musculoskeletal: Neck supple.     Thyroid: No thyromegaly.  Cardiovascular:     Rate and Rhythm: Normal rate and regular rhythm.     Heart sounds: Normal heart sounds. No murmur.  Pulmonary:     Effort: Pulmonary effort is normal. No respiratory distress.     Breath sounds: Normal breath sounds. No wheezing.  Musculoskeletal: Normal range of motion.  Lymphadenopathy:     Cervical: No cervical adenopathy.  Skin:    General: Skin is warm and dry.     Findings: No rash.  Neurological:     Mental Status: He is alert and oriented to person, place, and time.     Coordination: Coordination normal.  Psychiatric:        Behavior: Behavior normal.       Assessment & Plan:   Problem List Items Addressed This Visit      Cardiovascular and  Mediastinum   Hypertension associated with diabetes ([place])   Relevant Medications   lisinopril (ZESTRIL) 20 MG tablet   metFORMIN (GLUCOPHAGE) 1000 MG tablet   amLODipine (NORVASC) 10 MG tablet   metoprolol succinate (TOPROL-XL) 100 MG 24 hr tablet   pravastatin (PRAVACHOL) 40 MG tablet   Paroxysmal atrial fibrillation (HCC)   Relevant Medications   lisinopril (ZESTRIL) 20 MG tablet   amLODipine (NORVASC) 10 MG tablet   metoprolol succinate (TOPROL-XL) 100 MG 24 hr tablet   pravastatin (PRAVACHOL) 40 MG tablet     Digestive   GERD (gastroesophageal reflux disease)   Relevant Medications  omeprazole (PRILOSEC) 20 MG capsule     Endocrine   Hyperlipidemia associated with type 2 diabetes mellitus (HCC)   Relevant Medications   lisinopril (ZESTRIL) 20 MG tablet   metFORMIN (GLUCOPHAGE) 1000 MG tablet   pravastatin (PRAVACHOL) 40 MG tablet   Type 2 diabetes mellitus without complications (HCC)   Relevant Medications   lisinopril (ZESTRIL) 20 MG tablet   metFORMIN (GLUCOPHAGE) 1000 MG tablet   potassium chloride SA (K-DUR) 20 MEQ tablet   pravastatin (PRAVACHOL) 40 MG tablet     Hematopoietic and Hemostatic   Primary hypercoagulable state ([place]) [D68.59]     Other   Long term (current) use of anticoagulants [Z79.01] - Primary   Relevant Orders   CoaguChek XS/INR Waived   History of DVT (deep vein thrombosis)    Other Visit Diagnoses    Essential hypertension       Relevant Medications   lisinopril (ZESTRIL) 20 MG tablet   amLODipine (NORVASC) 10 MG tablet   metoprolol succinate (TOPROL-XL) 100 MG 24 hr tablet   pravastatin (PRAVACHOL) 40 MG tablet   Type 2 diabetes mellitus with other specified complication, without long-term current use of insulin (HCC)       Relevant Medications   lisinopril (ZESTRIL) 20 MG tablet   metFORMIN (GLUCOPHAGE) 1000 MG tablet   potassium chloride SA (K-DUR) 20 MEQ tablet   pravastatin (PRAVACHOL) 40 MG tablet   Other Relevant Orders    Bayer DCA Hb A1c Waived      Description   Continue to take 1 tablet every day of the week except for Mondays, Wednesdays, and Saturdays take 1 1/2 tablets  INR was 2.5 (goal 2-3) Perfect reading!     Diabetic Foot Exam - Simple   Simple Foot Form Diabetic Foot exam was performed with the following findings: Yes 05/29/2019 11:14 AM  Visual Inspection No deformities, no ulcerations, no other skin breakdown bilaterally: Yes Sensation Testing Intact to touch and monofilament testing bilaterally: Yes Pulse Check Posterior Tibialis and Dorsalis pulse intact bilaterally: Yes Comments     Follow up plan: Return in about 4 weeks (around 06/26/2019), or if symptoms worsen or fail to improve, for INR recheck.  Counseling provided for all of the vaccine components Orders Placed This Encounter  Procedures  . CoaguChek XS/INR [place], MD [facility] 05/29/2019, 10:58 AM

## 2019-05-30 ENCOUNTER — Ambulatory Visit (INDEPENDENT_AMBULATORY_CARE_PROVIDER_SITE_OTHER): Payer: Medicare Other | Admitting: Urology

## 2019-05-30 DIAGNOSIS — C61 Malignant neoplasm of prostate: Secondary | ICD-10-CM | POA: Diagnosis not present

## 2019-05-30 DIAGNOSIS — R351 Nocturia: Secondary | ICD-10-CM | POA: Diagnosis not present

## 2019-05-30 DIAGNOSIS — R972 Elevated prostate specific antigen [PSA]: Secondary | ICD-10-CM | POA: Diagnosis not present

## 2019-05-30 DIAGNOSIS — N401 Enlarged prostate with lower urinary tract symptoms: Secondary | ICD-10-CM | POA: Diagnosis not present

## 2019-06-30 ENCOUNTER — Other Ambulatory Visit: Payer: Self-pay

## 2019-07-01 ENCOUNTER — Other Ambulatory Visit: Payer: Self-pay

## 2019-07-01 ENCOUNTER — Encounter: Payer: Self-pay | Admitting: Family Medicine

## 2019-07-01 ENCOUNTER — Ambulatory Visit (INDEPENDENT_AMBULATORY_CARE_PROVIDER_SITE_OTHER): Payer: Medicare Other | Admitting: Family Medicine

## 2019-07-01 VITALS — BP 154/61 | HR 57 | Temp 98.9°F | Ht 65.0 in | Wt 156.6 lb

## 2019-07-01 DIAGNOSIS — Z86718 Personal history of other venous thrombosis and embolism: Secondary | ICD-10-CM | POA: Diagnosis not present

## 2019-07-01 DIAGNOSIS — Z7901 Long term (current) use of anticoagulants: Secondary | ICD-10-CM

## 2019-07-01 DIAGNOSIS — D6859 Other primary thrombophilia: Secondary | ICD-10-CM | POA: Diagnosis not present

## 2019-07-01 LAB — COAGUCHEK XS/INR WAIVED
INR: 1.9 — ABNORMAL HIGH (ref 0.9–1.1)
Prothrombin Time: 22.6 s

## 2019-07-01 NOTE — Progress Notes (Signed)
BP (!) 154/61   Pulse (!) 57   Temp 98.9 F (37.2 C) (Oral)   Ht 5\' 5"  (1.651 m)   Wt 156 lb 9.6 oz (71 kg)   BMI 26.06 kg/m    Subjective:   Patient ID: Alan Netters., male    DOB: 12/22/1940, 78 y.o.   MRN: [id number]  HPI: Alan Agustin. is a 78 y.o. male presenting on 07/01/2019 for Coagulation Disorder   HPI Anticoagulation recheck Patient is coming in for anticoagulation recheck today.  He denies any bruising or bleeding or chest pain or palpitations.  He feels like overall his health is doing very well.  He denies any shortness of breath.  Relevant past medical, surgical, family and social history reviewed and updated as indicated. Interim medical history since our last visit reviewed. Allergies and medications reviewed and updated.  Review of Systems  Constitutional: Negative for chills and fever.  Respiratory: Negative for shortness of breath and wheezing.   Cardiovascular: Negative for chest pain, palpitations and leg swelling.  Gastrointestinal: Negative for anal bleeding and blood in stool.  Genitourinary: Negative for hematuria.  Musculoskeletal: Negative for back pain and gait problem.  Skin: Negative for rash.  All other systems reviewed and are negative.   Per HPI unless specifically indicated above   Allergies as of 07/01/2019      Reactions   Allopurinol Other (See Comments)   Significantly decreased WBC's   Colchicine Other (See Comments)   Significantly decreased WBC's      Medication List       Accurate as of July 01, 2019 11:22 AM. If you have any questions, ask your nurse or doctor.        amLODipine 10 MG tablet Commonly known as: NORVASC Take 1 tablet (10 mg total) by mouth every evening.   chlorthalidone 25 MG tablet Commonly known as: HYGROTON Take 1 tablet (25 mg total) by mouth daily. Please make overdue appt with Dr. Tamala Julian before anymore refills. 1st attempt   finasteride 5 MG tablet Commonly known as: PROSCAR Take 1 tablet  (5 mg total) by mouth daily.   Fish Oil 1000 MG Caps Take 2 capsules by mouth 2 (two) times daily.   fluticasone 50 MCG/ACT nasal spray Commonly known as: FLONASE Place 1 spray into both nostrils 2 (two) times daily as needed for allergies or rhinitis.   glucose blood test strip Commonly known as: ONE TOUCH ULTRA TEST Use to check BG once daily.  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 Please make yearly appt with Dr. Tamala Julian for March before anymore refills. 1st attempt   metFORMIN 1000 MG tablet Commonly known as: GLUCOPHAGE TAKE 1 TABLET BY MOUTH TWICE DAILY WITH A MEAL   metoprolol succinate 100 MG 24 hr tablet Commonly known as: TOPROL-XL Take 1 tablet (100 mg total) by mouth daily. Take with or immediately following a meal.   omeprazole 20 MG capsule Commonly known as: PRILOSEC Take 1 capsule (20 mg total) by mouth daily.   potassium chloride SA 20 MEQ tablet Commonly known as: K-DUR Take 1 tablet (20 mEq total) by mouth daily.   pravastatin 40 MG tablet Commonly known as: PRAVACHOL Take 1 tablet (40 mg total) by mouth every evening.   warfarin 5 MG tablet  Commonly known as: COUMADIN Take as directed by the anticoagulation clinic. If you are unsure how to take this medication, talk to your nurse or doctor. Original instructions: TAKE 1 & 1/2 (ONE & ONE-HALF) TABLETS BY MOUTH ONCE DAILY AT 6 IN THE EVENING        Objective:   BP (!) 154/61   Pulse (!) 57   Temp 98.9 F (37.2 C) (Oral)   Ht 5\' 5"  (1.651 m)   Wt 156 lb 9.6 oz (71 kg)   BMI 26.06 kg/m   Wt Readings from Last 3 Encounters:  07/01/19 156 lb 9.6 oz (71 kg)  05/29/19 156 lb 6.4 oz (70.9 kg)  04/09/19 162 lb (73.5 kg)    Physical Exam Vitals signs and nursing note reviewed.  Constitutional:      General: He is not in acute  distress.    Appearance: He is well-developed. He is not diaphoretic.  Eyes:     General: No scleral icterus.    Conjunctiva/sclera: Conjunctivae normal.  Cardiovascular:     Rate and Rhythm: Normal rate and regular rhythm.     Heart sounds: Normal heart sounds. No murmur.  Pulmonary:     Effort: Pulmonary effort is normal. No respiratory distress.     Breath sounds: Normal breath sounds. No wheezing.  Musculoskeletal: Normal range of motion.  Skin:    General: Skin is warm and dry.     Findings: No bruising or rash.  Neurological:     Mental Status: He is alert and oriented to person, place, and time.     Coordination: Coordination normal.  Psychiatric:        Behavior: Behavior normal.     Description   Take an extra half today and then continue to take 1 tablet every day of the week except for Mondays, Wednesdays, and Saturdays take 1 1/2 tablets  INR was 1.9 (goal 2-3) a little thick Follow-up in 4 weeks      Assessment & Plan:   Problem List Items Addressed This Visit      Hematopoietic and Hemostatic   Primary hypercoagulable state ([place]) [D68.59]     Other   Long term (current) use of anticoagulants [Z79.01] - Primary   Relevant Orders   CoaguChek XS/INR Waived   History of DVT (deep vein thrombosis)       Follow up plan: Return in about 4 weeks (around 07/29/2019), or if symptoms worsen or fail to improve, for Coumadin and anticoagulation recheck.  Counseling provided for all of the vaccine components Orders Placed This Encounter  Procedures  . CoaguChek XS/INR [place], MD [facility] 07/01/2019, 11:22 AM

## 2019-07-24 ENCOUNTER — Other Ambulatory Visit: Payer: Self-pay | Admitting: Family Medicine

## 2019-07-28 ENCOUNTER — Other Ambulatory Visit: Payer: Self-pay | Admitting: Interventional Cardiology

## 2019-07-28 ENCOUNTER — Other Ambulatory Visit: Payer: Self-pay

## 2019-07-28 MED ORDER — CHLORTHALIDONE 25 MG PO TABS: 25.0000 mg | ORAL_TABLET | Freq: Every day | ORAL | 0 refills | Status: DC

## 2019-07-28 NOTE — Telephone Encounter (Signed)
Pt's medication was sent to pt's pharmacy as requested. Confirmation received.  °

## 2019-07-28 NOTE — Telephone Encounter (Signed)
° ° ° °*  STAT* If patient is at the pharmacy, call can be transferred to refill team.   1. Which medications need to be refilled? (please list name of each medication and dose if known) chlorthalidone (HYGROTON) 25 MG tablet  2. Which pharmacy/location (including street and city if local pharmacy) is medication to be sent to? [place], [address]  3. Do they need a 30 day or 90 day supply? [place]

## 2019-07-29 ENCOUNTER — Encounter: Payer: Self-pay | Admitting: Family Medicine

## 2019-07-29 ENCOUNTER — Ambulatory Visit (INDEPENDENT_AMBULATORY_CARE_PROVIDER_SITE_OTHER): Payer: Medicare Other | Admitting: Family Medicine

## 2019-07-29 VITALS — BP 147/62 | HR 77 | Temp 98.6°F | Ht 65.0 in | Wt 158.2 lb

## 2019-07-29 DIAGNOSIS — Z86718 Personal history of other venous thrombosis and embolism: Secondary | ICD-10-CM

## 2019-07-29 DIAGNOSIS — Z7901 Long term (current) use of anticoagulants: Secondary | ICD-10-CM | POA: Diagnosis not present

## 2019-07-29 DIAGNOSIS — D6859 Other primary thrombophilia: Secondary | ICD-10-CM

## 2019-07-29 LAB — COAGUCHEK XS/INR WAIVED
INR: 3.2 — ABNORMAL HIGH (ref 0.9–1.1)
Prothrombin Time: 38.3 s

## 2019-07-29 MED ORDER — ONETOUCH ULTRA MINI W/DEVICE KIT: 1.0000 | PACK | Freq: Two times a day (BID) | 1 refills | Status: DC

## 2019-07-29 NOTE — Progress Notes (Signed)
BP (!) 147/62   Pulse 77   Temp 98.6 F (37 C) (Temporal)   Ht 5\' 5"  (1.651 m)   Wt 158 lb 3.2 oz (71.8 kg)   BMI 26.33 kg/m    Subjective:   Patient ID: Alan Netters., male    DOB: 06/05/1941, 78 y.o.   MRN: [id number]  HPI: Alan Kendzierski. is a 78 y.o. male presenting on 07/29/2019 for Coagulation Disorder   HPI Patient is coming in for Coumadin recheck and coagulation disorder.  He says that he is not having any bruising or bleeding abnormalities.  He denies any chest pain or congestion or clots.  Relevant past medical, surgical, family and social history reviewed and updated as indicated. Interim medical history since our last visit reviewed. Allergies and medications reviewed and updated.  Review of Systems  Constitutional: Negative for chills and fever.  Respiratory: Negative for shortness of breath and wheezing.   Cardiovascular: Negative for chest pain and leg swelling.  Gastrointestinal: Negative for blood in stool.  Genitourinary: Negative for hematuria.  Musculoskeletal: Negative for back pain and gait problem.  Skin: Negative for rash.  Neurological: Negative for dizziness, weakness and light-headedness.  All other systems reviewed and are negative.   Per HPI unless specifically indicated above   Allergies as of 07/29/2019      Reactions   Allopurinol Other (See Comments)   Significantly decreased WBC's   Colchicine Other (See Comments)   Significantly decreased WBC's      Medication List       Accurate as of July 29, 2019 11:43 AM. If you have any questions, ask your nurse or doctor.        amLODipine 10 MG tablet Commonly known as: NORVASC Take 1 tablet (10 mg total) by mouth every evening.   chlorthalidone 25 MG tablet Commonly known as: HYGROTON Take 1 tablet (25 mg total) by mouth daily. Please keep upcoming appt in November with Dr. Tamala Julian before anymore refills. Thank you   finasteride 5 MG tablet Commonly known as: PROSCAR Take 1  tablet (5 mg total) by mouth daily.   Fish Oil 1000 MG Caps Take 2 capsules by mouth 2 (two) times daily.   fluticasone 50 MCG/ACT nasal spray Commonly known as: FLONASE Place 1 spray into both nostrils 2 (two) times daily as needed for allergies or rhinitis.   glucose blood test strip Commonly known as: ONE TOUCH ULTRA TEST Use to check BG once daily.  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 Please make yearly appt with Dr. Tamala Julian for March before anymore refills. 1st attempt   metFORMIN 1000 MG tablet Commonly known as: GLUCOPHAGE TAKE 1 TABLET BY MOUTH TWICE DAILY WITH A MEAL   metoprolol succinate 100 MG 24 hr tablet Commonly known as: TOPROL-XL Take 1 tablet (100 mg total) by mouth daily. Take with or immediately following a meal.   omeprazole 20 MG capsule Commonly known as: PRILOSEC Take 1 capsule (20 mg total) by mouth daily.   potassium chloride SA 20 MEQ tablet Commonly known as: K-DUR Take 1 tablet (20 mEq total) by mouth daily.   pravastatin 40 MG tablet Commonly known as: PRAVACHOL Take 1 tablet (40 mg total) by mouth every evening.   warfarin 5 MG tablet Commonly known  as: COUMADIN Take as directed by the anticoagulation clinic. If you are unsure how to take this medication, talk to your nurse or doctor. Original instructions: TAKE 1 & 1/2 (ONE & ONE-HALF) TABLETS BY MOUTH ONCE DAILY AT 6 IN THE EVENING        Objective:   BP (!) 147/62   Pulse 77   Temp 98.6 F (37 C) (Temporal)   Ht 5\' 5"  (1.651 m)   Wt 158 lb 3.2 oz (71.8 kg)   BMI 26.33 kg/m   Wt Readings from Last 3 Encounters:  07/29/19 158 lb 3.2 oz (71.8 kg)  07/01/19 156 lb 9.6 oz (71 kg)  05/29/19 156 lb 6.4 oz (70.9 kg)    Physical Exam Vitals signs and nursing note reviewed.  Constitutional:      General: He is  not in acute distress.    Appearance: He is well-developed. He is not diaphoretic.  Eyes:     General: No scleral icterus.    Conjunctiva/sclera: Conjunctivae normal.  Neck:     Musculoskeletal: Neck supple.     Thyroid: No thyromegaly.  Lymphadenopathy:     Cervical: No cervical adenopathy.  Skin:    Findings: No bruising.  Neurological:     Mental Status: He is alert and oriented to person, place, and time.     Coordination: Coordination normal.  Psychiatric:        Behavior: Behavior normal.     Description   Decrease dose to take 1 tablet every day of the week except for Mondays, and Saturdays take 1 1/2 tablets  INR was 3.2 (goal 2-3) a little thick Follow-up in 4 weeks      Assessment & Plan:   Problem List Items Addressed This Visit      Hematopoietic and Hemostatic   Primary hypercoagulable state ([place]) [D68.59]     Other   Long term (current) use of anticoagulants [Z79.01] - Primary   Relevant Orders   CoaguChek XS/INR Waived   History of DVT (deep vein thrombosis)       Follow up plan: Return in about 4 weeks (around 08/26/2019), or if symptoms worsen or fail to improve, for INR recheck/Coumadin and diabetes and chronic illness check.  Counseling provided for all of the vaccine components Orders Placed This Encounter  Procedures  . CoaguChek XS/INR [place], MD [facility] 07/29/2019, 11:43 AM

## 2019-07-31 ENCOUNTER — Other Ambulatory Visit: Payer: Self-pay | Admitting: Interventional Cardiology

## 2019-07-31 NOTE — Telephone Encounter (Signed)
Outpatient Medication Detail   Disp Refills Start End   chlorthalidone (HYGROTON) 25 MG tablet 90 tablet 0 07/28/2019    Sig - Route: Take 1 tablet (25 mg total) by mouth daily. Please keep upcoming appt in November with Dr. Tamala Julian before anymore refills. Thank you - Oral   Sent to pharmacy as: chlorthalidone (HYGROTON) 25 MG tablet   E-Prescribing Status: Receipt confirmed by pharmacy (07/28/2019 10:18 AM EDT)   Pharmacy  [facility] [place], [id number]

## 2019-08-12 DIAGNOSIS — Z8719 Personal history of other diseases of the digestive system: Secondary | ICD-10-CM

## 2019-08-12 HISTORY — DX: Personal history of other diseases of the digestive system: Z87.19

## 2019-08-27 ENCOUNTER — Ambulatory Visit (INDEPENDENT_AMBULATORY_CARE_PROVIDER_SITE_OTHER): Payer: Medicare Other | Admitting: Family Medicine

## 2019-08-27 DIAGNOSIS — R1084 Generalized abdominal pain: Secondary | ICD-10-CM | POA: Diagnosis not present

## 2019-08-27 DIAGNOSIS — R1013 Epigastric pain: Secondary | ICD-10-CM | POA: Diagnosis not present

## 2019-08-27 DIAGNOSIS — Z8672 Personal history of thrombophlebitis: Secondary | ICD-10-CM | POA: Diagnosis not present

## 2019-08-27 DIAGNOSIS — R197 Diarrhea, unspecified: Secondary | ICD-10-CM | POA: Diagnosis not present

## 2019-08-27 DIAGNOSIS — E785 Hyperlipidemia, unspecified: Secondary | ICD-10-CM | POA: Diagnosis not present

## 2019-08-27 DIAGNOSIS — R42 Dizziness and giddiness: Secondary | ICD-10-CM | POA: Diagnosis not present

## 2019-08-27 DIAGNOSIS — Z7901 Long term (current) use of anticoagulants: Secondary | ICD-10-CM | POA: Diagnosis not present

## 2019-08-27 DIAGNOSIS — I482 Chronic atrial fibrillation, unspecified: Secondary | ICD-10-CM | POA: Diagnosis not present

## 2019-08-27 DIAGNOSIS — Z01812 Encounter for preprocedural laboratory examination: Secondary | ICD-10-CM | POA: Diagnosis not present

## 2019-08-27 DIAGNOSIS — K263 Acute duodenal ulcer without hemorrhage or perforation: Secondary | ICD-10-CM | POA: Diagnosis not present

## 2019-08-27 DIAGNOSIS — D62 Acute posthemorrhagic anemia: Secondary | ICD-10-CM | POA: Diagnosis not present

## 2019-08-27 DIAGNOSIS — K317 Polyp of stomach and duodenum: Secondary | ICD-10-CM | POA: Diagnosis not present

## 2019-08-27 DIAGNOSIS — K254 Chronic or unspecified gastric ulcer with hemorrhage: Secondary | ICD-10-CM | POA: Diagnosis not present

## 2019-08-27 DIAGNOSIS — Z86711 Personal history of pulmonary embolism: Secondary | ICD-10-CM | POA: Diagnosis not present

## 2019-08-27 DIAGNOSIS — R61 Generalized hyperhidrosis: Secondary | ICD-10-CM | POA: Diagnosis not present

## 2019-08-27 DIAGNOSIS — R531 Weakness: Secondary | ICD-10-CM | POA: Diagnosis not present

## 2019-08-27 DIAGNOSIS — I10 Essential (primary) hypertension: Secondary | ICD-10-CM | POA: Diagnosis not present

## 2019-08-27 DIAGNOSIS — Z87891 Personal history of nicotine dependence: Secondary | ICD-10-CM | POA: Diagnosis not present

## 2019-08-27 DIAGNOSIS — Z9049 Acquired absence of other specified parts of digestive tract: Secondary | ICD-10-CM | POA: Diagnosis not present

## 2019-08-27 DIAGNOSIS — K922 Gastrointestinal hemorrhage, unspecified: Secondary | ICD-10-CM | POA: Diagnosis not present

## 2019-08-27 DIAGNOSIS — K264 Chronic or unspecified duodenal ulcer with hemorrhage: Secondary | ICD-10-CM | POA: Diagnosis not present

## 2019-08-27 DIAGNOSIS — I480 Paroxysmal atrial fibrillation: Secondary | ICD-10-CM | POA: Diagnosis not present

## 2019-08-27 DIAGNOSIS — K861 Other chronic pancreatitis: Secondary | ICD-10-CM | POA: Diagnosis not present

## 2019-08-27 DIAGNOSIS — K259 Gastric ulcer, unspecified as acute or chronic, without hemorrhage or perforation: Secondary | ICD-10-CM | POA: Diagnosis not present

## 2019-08-27 DIAGNOSIS — K228 Other specified diseases of esophagus: Secondary | ICD-10-CM | POA: Diagnosis not present

## 2019-08-27 DIAGNOSIS — Z86718 Personal history of other venous thrombosis and embolism: Secondary | ICD-10-CM | POA: Diagnosis not present

## 2019-08-27 DIAGNOSIS — K269 Duodenal ulcer, unspecified as acute or chronic, without hemorrhage or perforation: Secondary | ICD-10-CM | POA: Diagnosis not present

## 2019-08-27 DIAGNOSIS — E119 Type 2 diabetes mellitus without complications: Secondary | ICD-10-CM | POA: Diagnosis not present

## 2019-08-27 NOTE — Progress Notes (Signed)
Telephone visit  Subjective: CC: GI problem PCP: Dettinger, Fransisca Kaufmann, MD MOQ:Alan [name]. is a 78 y.o. male calls for telephone consult today. Patient provides verbal consent for consult held via phone.  Location of patient: home Location of provider: [facility] Others present for call: wife  1. Feeling poorly She reports onset of nausea, abdominal pain (with eating) about 2 weeks.  He is able to drink water and milk.  She reports diaphoresis, diarrhea, decreased PO intake, weakness, dizziness.  She notes he presented very similarly several years ago when he had pancreatitis.  He was hospitalized about [facility] in 2017.  ROS: Per HPI  Allergies  Allergen Reactions  . Allopurinol Other (See Comments)    Significantly decreased WBC's  . Colchicine Other (See Comments)    Significantly decreased WBC's   Past Medical History:  Diagnosis Date  . Adenomatous colon polyp   . Agranulocytosis ([place])   . Allergy   . Atrial fibrillation ([place]) 04/19/2016  . Cataract    small  . Diabetes mellitus   . Diverticulosis   . ED (erectile dysfunction)   . 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 Gallstones   . GERD (gastroesophageal reflux disease)   . Gout   . Heart murmur   . Hyperlipidemia   . Hypertension   . Internal hemorrhoids   . Pancreatitis   . 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  . Prostate cancer ([place])    Followed by Dr. Jeffie Pollock     Current Outpatient Medications:  .  amLODipine (NORVASC) 10 MG tablet, Take 1 tablet (10 mg total) by mouth every evening., Disp: 90 tablet, Rfl: 3 .  Blood Glucose Monitoring Suppl (ONE TOUCH ULTRA MINI) w/Device KIT, 1 each by Does not apply route 2 (two) times daily., Disp: 1 kit, Rfl: 1 .  chlorthalidone (HYGROTON) 25 MG tablet, Take 1 tablet (25 mg total) by mouth daily. Please keep upcoming appt in November with Dr. Tamala Julian before anymore refills. Thank you, Disp: 90 tablet, Rfl: 0 .  Ferrous Sulfate (IRON) 325  (65 Fe) MG TABS, Take 1 tablet by mouth once daily with breakfast, Disp: 90 tablet, Rfl: 0 .  finasteride (PROSCAR) 5 MG tablet, Take 1 tablet (5 mg total) by mouth daily., Disp: 90 tablet, Rfl: 3 .  fluticasone (FLONASE) 50 MCG/ACT nasal spray, Place 1 spray into both nostrils 2 (two) times daily as needed for allergies or rhinitis., Disp: 16 g, Rfl: 6 .  glucose blood (ONE TOUCH ULTRA TEST) test strip, Use to check BG once daily.  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  lisinopril (ZESTRIL) 20 MG tablet, Take 1.5 tablets (30 mg total) by mouth daily. Please make yearly appt with Dr. Tamala Julian for March before anymore refills. 1st attempt, Disp: 135 tablet, Rfl: 3 .  metFORMIN (GLUCOPHAGE) 1000 MG tablet, TAKE 1 TABLET BY MOUTH TWICE DAILY WITH A MEAL, Disp: 180 tablet, Rfl: 3 .  metoprolol succinate (TOPROL-XL) 100 MG 24 hr tablet, Take 1 tablet (100 mg total) by mouth daily. Take with or immediately following a meal., Disp: 90 tablet, Rfl: 3 .  Omega-3 Fatty Acids (FISH OIL) 1000 MG CAPS, Take 2 capsules by mouth 2 (two) times daily., Disp: , Rfl:  .  omeprazole (PRILOSEC) 20 MG capsule, Take 1 capsule (20 mg total) by mouth daily., Disp: 90 capsule, Rfl: 3 .  potassium chloride SA (K-DUR) 20 MEQ tablet, Take 1 tablet (20 mEq total)  by mouth daily., Disp: 90 tablet, Rfl: 3 .  pravastatin (PRAVACHOL) 40 MG tablet, Take 1 tablet (40 mg total) by mouth every evening., Disp: 90 tablet, Rfl: 3 .  warfarin (COUMADIN) 5 MG tablet, TAKE 1 & 1/2 (ONE & ONE-HALF) TABLETS BY MOUTH ONCE DAILY AT 6 IN THE EVENING, Disp: 180 tablet, Rfl: 0  Assessment/ Plan: 78 y.o. male   1. Generalized abdominal pain I am very concerned about an acute pancreatitis in this patient who has history of necrotizing pancreatitis in 2017.  I discussed my concerns with him, particularly given decreased p.o. intake, ongoing abdominal pain with eating and now generalized weakness and dizziness.  I have advised he and  his wife to seek immediate medical attention.  She will bring him to [facility] for further eval.  I will CC chart to PCP for continuity of care and ongoing surveillance.   Start time: 5:24pm End time: 5:32pm  Total time spent on patient care (including telephone call/ virtual visit): 13 minutes  [place], [place] [phone]

## 2019-08-28 DIAGNOSIS — I480 Paroxysmal atrial fibrillation: Secondary | ICD-10-CM | POA: Diagnosis not present

## 2019-08-28 DIAGNOSIS — R1013 Epigastric pain: Secondary | ICD-10-CM | POA: Diagnosis not present

## 2019-08-28 DIAGNOSIS — D62 Acute posthemorrhagic anemia: Secondary | ICD-10-CM | POA: Diagnosis not present

## 2019-08-28 DIAGNOSIS — Z7901 Long term (current) use of anticoagulants: Secondary | ICD-10-CM | POA: Diagnosis not present

## 2019-08-28 MED ORDER — DOLAGESIC PO
5.00 | ORAL | Status: DC
Start: 2019-09-02 — End: 2019-08-28

## 2019-08-28 MED ORDER — Medication
100.00 | Status: DC
Start: 2019-08-29 — End: 2019-08-28

## 2019-08-28 MED ORDER — PHENYLEPHRINE-GUAIFENESIN 30-400 MG PO CP12
10.00 | ORAL_CAPSULE | ORAL | Status: DC
Start: 2019-09-02 — End: 2019-08-28

## 2019-08-28 MED ORDER — HCA GLYCERIN (INFANT) 1.5 G RE SUPP
40.00 | RECTAL | Status: DC
Start: 2019-09-01 — End: 2019-08-28

## 2019-08-28 MED ORDER — DOCUSIL PLUS 30-100 MG PO CAPS
1000.00 | ORAL_CAPSULE | ORAL | Status: DC
Start: 2019-09-01 — End: 2019-08-28

## 2019-08-28 MED ORDER — Medication
2.50 | Status: DC
Start: 2019-08-28 — End: 2019-08-28

## 2019-08-28 MED ORDER — LACTATED RINGERS IV SOLN
INTRAVENOUS | Status: DC
Start: ? — End: 2019-08-28

## 2019-08-28 MED ORDER — ASTELIN 137 MCG/SPRAY NA SOLN
40.00 | NASAL | Status: DC
Start: 2019-08-28 — End: 2019-08-28

## 2019-08-29 ENCOUNTER — Ambulatory Visit: Payer: Medicare Other | Admitting: Family Medicine

## 2019-08-29 DIAGNOSIS — K264 Chronic or unspecified duodenal ulcer with hemorrhage: Secondary | ICD-10-CM | POA: Diagnosis not present

## 2019-08-29 DIAGNOSIS — K263 Acute duodenal ulcer without hemorrhage or perforation: Secondary | ICD-10-CM | POA: Diagnosis not present

## 2019-08-29 DIAGNOSIS — Z7901 Long term (current) use of anticoagulants: Secondary | ICD-10-CM | POA: Diagnosis not present

## 2019-08-29 DIAGNOSIS — I480 Paroxysmal atrial fibrillation: Secondary | ICD-10-CM | POA: Diagnosis not present

## 2019-08-29 DIAGNOSIS — Z86718 Personal history of other venous thrombosis and embolism: Secondary | ICD-10-CM | POA: Diagnosis not present

## 2019-08-29 DIAGNOSIS — K317 Polyp of stomach and duodenum: Secondary | ICD-10-CM | POA: Diagnosis not present

## 2019-08-29 DIAGNOSIS — Z86711 Personal history of pulmonary embolism: Secondary | ICD-10-CM | POA: Diagnosis not present

## 2019-08-29 DIAGNOSIS — K228 Other specified diseases of esophagus: Secondary | ICD-10-CM | POA: Diagnosis not present

## 2019-08-29 DIAGNOSIS — K922 Gastrointestinal hemorrhage, unspecified: Secondary | ICD-10-CM | POA: Diagnosis not present

## 2019-08-29 DIAGNOSIS — K254 Chronic or unspecified gastric ulcer with hemorrhage: Secondary | ICD-10-CM | POA: Diagnosis not present

## 2019-09-03 ENCOUNTER — Ambulatory Visit (INDEPENDENT_AMBULATORY_CARE_PROVIDER_SITE_OTHER): Payer: Medicare Other | Admitting: Family Medicine

## 2019-09-03 ENCOUNTER — Encounter: Payer: Self-pay | Admitting: Family Medicine

## 2019-09-03 ENCOUNTER — Telehealth: Payer: Self-pay | Admitting: Family Medicine

## 2019-09-03 VITALS — BP 112/49 | HR 92 | Temp 97.8°F | Resp 18 | Ht 65.0 in | Wt 156.4 lb

## 2019-09-03 DIAGNOSIS — D6859 Other primary thrombophilia: Secondary | ICD-10-CM | POA: Diagnosis not present

## 2019-09-03 DIAGNOSIS — Z7901 Long term (current) use of anticoagulants: Secondary | ICD-10-CM | POA: Diagnosis not present

## 2019-09-03 DIAGNOSIS — Z86718 Personal history of other venous thrombosis and embolism: Secondary | ICD-10-CM | POA: Diagnosis not present

## 2019-09-03 DIAGNOSIS — Z23 Encounter for immunization: Secondary | ICD-10-CM

## 2019-09-03 LAB — COAGUCHEK XS/INR WAIVED
INR: 1 (ref 0.9–1.1)
Prothrombin Time: 12.1 s

## 2019-09-03 MED ORDER — Medication
100.00 | Status: DC
Start: 2019-09-02 — End: 2019-09-03

## 2019-09-03 NOTE — Telephone Encounter (Signed)
If he can see me then have him get in with somebody else for this time only

## 2019-09-03 NOTE — Progress Notes (Signed)
BP (!) 112/49   Pulse 92   Temp 97.8 F (36.6 C) (Temporal)   Resp 18   Ht _0  (1.651 m)   Wt 156 lb 6.4 oz (70.9 kg)   SpO2 100%   BMI 26.03 kg/m    Subjective:   Patient ID: Alan [name]., male    DOB: 1941/11/27, 78 y.o.   MRN: [id number]  HPI: Alan [name]. is a 78 y.o. male presenting on 09/03/2019 for Coagulation Disorder and Hospitalization Follow-up ([place]- GI bleed)   HPI Patient is coming in for Coumadin INR recheck in hospital follow-up for GI bleed.  He was in the hospital on 08/27/2019 discharged on 09/01/2019 for a GI bleed.  They stopped his Coumadin and then the scope and started him on a double dose of PPI a day and he has been doing well on that.  Patient denies any further bleeding or black stools since leaving the hospital.  Relevant past medical, surgical, family and social history reviewed and updated as indicated. Interim medical history since our last visit reviewed. Allergies and medications reviewed and updated.  Review of Systems  Constitutional: Negative for chills and fever.  Respiratory: Negative for shortness of breath and wheezing.   Cardiovascular: Negative for chest pain and leg swelling.  Gastrointestinal: Negative for abdominal pain, anal bleeding, blood in stool, constipation and diarrhea.  Musculoskeletal: Negative for back pain and gait problem.  Skin: Negative for rash.  Neurological: Negative for dizziness, weakness and light-headedness.  All other systems reviewed and are negative.   Per HPI unless specifically indicated above      Objective:   BP (!) 112/49   Pulse 92   Temp 97.8 F (36.6 C) (Temporal)   Resp 18   Ht _1  (1.651 m)   Wt 156 lb 6.4 oz (70.9 kg)   SpO2 100%   BMI 26.03 kg/m   Wt Readings from Last 3 Encounters:  09/03/19 156 lb 6.4 oz (70.9 kg)  07/29/19 158 lb 3.2 oz (71.8 kg)  07/01/19 156 lb 9.6 oz (71 kg)    Physical Exam Vitals signs and nursing note reviewed.  Constitutional:    General: He is not in acute distress.    Appearance: He is well-developed. He is not diaphoretic.  Eyes:     General: No scleral icterus.    Conjunctiva/sclera: Conjunctivae normal.  Neck:     Musculoskeletal: Neck supple.     Thyroid: No thyromegaly.  Cardiovascular:     Rate and Rhythm: Normal rate and regular rhythm.     Heart sounds: Normal heart sounds. No murmur.  Pulmonary:     Effort: Pulmonary effort is normal. No respiratory distress.     Breath sounds: Normal breath sounds. No wheezing.  Abdominal:     General: Abdomen is flat. Bowel sounds are normal. There is no distension.     Tenderness: There is no abdominal tenderness. There is no right CVA tenderness, left CVA tenderness, guarding or rebound.  Musculoskeletal: Normal range of motion.  Lymphadenopathy:     Cervical: No cervical adenopathy.  Skin:    General: Skin is warm and dry.     Findings: No rash.  Neurological:     Mental Status: He is alert and oriented to person, place, and time.     Coordination: Coordination normal.  Psychiatric:        Behavior: Behavior normal.      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  Resume on October 1 per GI recommendations from the hospital if no bleeding until that time  Follow up plan: Return if symptoms worsen or fail to improve, for Return to office on October 9 for about that time for INR.  Counseling provided for all of the vaccine components Orders Placed This Encounter  Procedures  . CoaguChek XS/INR Waived  . CBC with Differential/Platelet  . [place], MD [facility] 09/03/2019, 3:17 PM

## 2019-09-04 NOTE — Telephone Encounter (Signed)
lmtcb

## 2019-09-05 LAB — CBC WITH DIFFERENTIAL/PLATELET
Basophils Absolute: 0 10*3/uL (ref 0.0–0.2)
Basos: 0 %
EOS (ABSOLUTE): 0.1 10*3/uL (ref 0.0–0.4)
Eos: 1 %
Hematocrit: 26 % — ABNORMAL LOW (ref 37.5–51.0)
Hemoglobin: 8.7 g/dL — CL (ref 13.0–17.7)
Immature Grans (Abs): 0 10*3/uL (ref 0.0–0.1)
Immature Granulocytes: 1 %
Lymphocytes Absolute: 1.7 10*3/uL (ref 0.7–3.1)
Lymphs: 24 %
MCH: 31 pg (ref 26.6–33.0)
MCHC: 33.5 g/dL (ref 31.5–35.7)
MCV: 93 fL (ref 79–97)
Monocytes Absolute: 1.6 10*3/uL — ABNORMAL HIGH (ref 0.1–0.9)
Monocytes: 21 %
Neutrophils Absolute: 4 10*3/uL (ref 1.4–7.0)
Neutrophils: 53 %
Platelets: 333 10*3/uL (ref 150–450)
RBC: 2.81 x10E6/uL — ABNORMAL LOW (ref 4.14–5.80)
RDW: 14.5 % (ref 11.6–15.4)
WBC: 7.4 10*3/uL (ref 3.4–10.8)

## 2019-09-05 LAB — CMP14+EGFR
ALT: 18 IU/L (ref 0–44)
AST: 21 IU/L (ref 0–40)
Albumin/Globulin Ratio: 1.2 (ref 1.2–2.2)
Albumin: 3.4 g/dL — ABNORMAL LOW (ref 3.7–4.7)
Alkaline Phosphatase: 70 IU/L (ref 39–117)
BUN/Creatinine Ratio: 10 (ref 10–24)
BUN: 14 mg/dL (ref 8–27)
Bilirubin Total: 1 mg/dL (ref 0.0–1.2)
CO2: 22 mmol/L (ref 20–29)
Calcium: 8.7 mg/dL (ref 8.6–10.2)
Chloride: 99 mmol/L (ref 96–106)
Creatinine, Ser: 1.34 mg/dL — ABNORMAL HIGH (ref 0.76–1.27)
GFR calc Af Amer: 59 mL/min/{1.73_m2} — ABNORMAL LOW (ref >59)
GFR calc non Af Amer: 51 mL/min/{1.73_m2} — ABNORMAL LOW (ref >59)
Globulin, Total: 2.8 g/dL (ref 1.5–4.5)
Glucose: 125 mg/dL — ABNORMAL HIGH (ref 65–99)
Potassium: 3.8 mmol/L (ref 3.5–5.2)
Sodium: 135 mmol/L (ref 134–144)
Total Protein: 6.2 g/dL (ref 6.0–8.5)

## 2019-09-10 ENCOUNTER — Other Ambulatory Visit: Payer: Self-pay

## 2019-09-10 ENCOUNTER — Other Ambulatory Visit: Payer: Medicare Other

## 2019-09-10 DIAGNOSIS — I10 Essential (primary) hypertension: Secondary | ICD-10-CM | POA: Diagnosis not present

## 2019-09-10 DIAGNOSIS — E1169 Type 2 diabetes mellitus with other specified complication: Secondary | ICD-10-CM

## 2019-09-10 DIAGNOSIS — E785 Hyperlipidemia, unspecified: Secondary | ICD-10-CM | POA: Diagnosis not present

## 2019-09-10 LAB — BAYER DCA HB A1C WAIVED: HB A1C (BAYER DCA - WAIVED): 5.5 % (ref <7.0)

## 2019-09-10 NOTE — Telephone Encounter (Signed)
Aware.  Appointment for protime scheduled.

## 2019-09-19 ENCOUNTER — Other Ambulatory Visit: Payer: Self-pay

## 2019-09-21 LAB — CMP14+EGFR
ALT: 9 IU/L (ref 0–44)
AST: 15 IU/L (ref 0–40)
Albumin/Globulin Ratio: 1.2 (ref 1.2–2.2)
Albumin: 3.5 g/dL — ABNORMAL LOW (ref 3.7–4.7)
Alkaline Phosphatase: 60 IU/L (ref 39–117)
BUN/Creatinine Ratio: 10 (ref 10–24)
BUN: 12 mg/dL (ref 8–27)
Bilirubin Total: 0.7 mg/dL (ref 0.0–1.2)
CO2: 23 mmol/L (ref 20–29)
Calcium: 9.1 mg/dL (ref 8.6–10.2)
Chloride: 103 mmol/L (ref 96–106)
Creatinine, Ser: 1.21 mg/dL (ref 0.76–1.27)
GFR calc Af Amer: 67 mL/min/{1.73_m2} (ref >59)
GFR calc non Af Amer: 58 mL/min/{1.73_m2} — ABNORMAL LOW (ref >59)
Globulin, Total: 3 g/dL (ref 1.5–4.5)
Glucose: 125 mg/dL — ABNORMAL HIGH (ref 65–99)
Potassium: 3.7 mmol/L (ref 3.5–5.2)
Sodium: 140 mmol/L (ref 134–144)
Total Protein: 6.5 g/dL (ref 6.0–8.5)

## 2019-09-21 LAB — LIPID PANEL
Chol/HDL Ratio: 2 ratio (ref 0.0–5.0)
Cholesterol, Total: 67 mg/dL — ABNORMAL LOW (ref 100–199)
HDL: 34 mg/dL — ABNORMAL LOW (ref >39)
LDL Chol Calc (NIH): 15 mg/dL (ref 0–99)
Triglycerides: 89 mg/dL (ref 0–149)
VLDL Cholesterol Cal: 18 mg/dL (ref 5–40)

## 2019-09-21 LAB — CBC WITH DIFFERENTIAL/PLATELET
Basophils Absolute: 0 10*3/uL (ref 0.0–0.2)
Basos: 0 %
EOS (ABSOLUTE): 0.1 10*3/uL (ref 0.0–0.4)
Eos: 2 %
Hematocrit: 28.3 % — ABNORMAL LOW (ref 37.5–51.0)
Hemoglobin: 9 g/dL — ABNORMAL LOW (ref 13.0–17.7)
Immature Grans (Abs): 0 10*3/uL (ref 0.0–0.1)
Immature Granulocytes: 0 %
Lymphocytes Absolute: 1.5 10*3/uL (ref 0.7–3.1)
Lymphs: 27 %
MCH: 30.5 pg (ref 26.6–33.0)
MCHC: 31.8 g/dL (ref 31.5–35.7)
MCV: 96 fL (ref 79–97)
Monocytes Absolute: 0.4 10*3/uL (ref 0.1–0.9)
Monocytes: 8 %
Neutrophils Absolute: 3.5 10*3/uL (ref 1.4–7.0)
Neutrophils: 63 %
Platelets: 297 10*3/uL (ref 150–450)
RBC: 2.95 x10E6/uL — ABNORMAL LOW (ref 4.14–5.80)
RDW: 14.5 % (ref 11.6–15.4)
WBC: 5.5 10*3/uL (ref 3.4–10.8)

## 2019-09-21 LAB — PSA, TOTAL AND FREE
PSA, Free Pct: 14 %
PSA, Free: 0.91 ng/mL
Prostate Specific Ag, Serum: 6.5 ng/mL — ABNORMAL HIGH (ref 0.0–4.0)

## 2019-09-22 ENCOUNTER — Other Ambulatory Visit: Payer: Self-pay

## 2019-09-22 ENCOUNTER — Ambulatory Visit (INDEPENDENT_AMBULATORY_CARE_PROVIDER_SITE_OTHER): Payer: Medicare Other | Admitting: Family Medicine

## 2019-09-22 ENCOUNTER — Encounter: Payer: Self-pay | Admitting: Family Medicine

## 2019-09-22 VITALS — BP 143/58 | HR 56 | Temp 98.4°F | Ht 65.0 in | Wt 154.4 lb

## 2019-09-22 DIAGNOSIS — N179 Acute kidney failure, unspecified: Secondary | ICD-10-CM | POA: Insufficient documentation

## 2019-09-22 DIAGNOSIS — Z7901 Long term (current) use of anticoagulants: Secondary | ICD-10-CM

## 2019-09-22 DIAGNOSIS — Z86718 Personal history of other venous thrombosis and embolism: Secondary | ICD-10-CM | POA: Diagnosis not present

## 2019-09-22 DIAGNOSIS — D6859 Other primary thrombophilia: Secondary | ICD-10-CM | POA: Diagnosis not present

## 2019-09-22 HISTORY — DX: Acute kidney failure, unspecified: N17.9

## 2019-09-22 LAB — COAGUCHEK XS/INR WAIVED
INR: 2.8 — ABNORMAL HIGH (ref 0.9–1.1)
Prothrombin Time: 34.2 s

## 2019-09-22 NOTE — Progress Notes (Signed)
BP (!) 143/58    Pulse (!) 56    Temp 98.4 F (36.9 C) (Temporal)    Ht '5\' 5"'$  (1.651 m)    Wt 154 lb 6.4 oz (70 kg)    SpO2 100%    BMI 25.69 kg/m    Subjective:   Patient ID: Alan [name]., male    DOB: 08-Aug-1941, 78 y.o.   MRN: [id number]  HPI: Alan [name]. is a 78 y.o. male presenting on 09/22/2019 for Coagulation Disorder   HPI Patient is coming in for Coumadin and anticoagulation recheck.  He denies any bruising or bleeding or any further GI bleeding and has been doing very well and is very happy with where he is at and feels very well.  He does have an EGD planned for 10/06/2019  Relevant past medical, surgical, family and social history reviewed and updated as indicated. Interim medical history since our last visit reviewed. Allergies and medications reviewed and updated.  Review of Systems  Constitutional: Negative for chills and fever.  Respiratory: Negative for shortness of breath and wheezing.   Cardiovascular: Negative for chest pain and leg swelling.  Gastrointestinal: Negative for anal bleeding and blood in stool.  Musculoskeletal: Negative for back pain and gait problem.  Skin: Negative for rash.  All other systems reviewed and are negative.   Per HPI unless specifically indicated above   Allergies as of 09/22/2019      Reactions   Allopurinol Other (See Comments)   Significantly decreased WBC's   Colchicine Other (See Comments)   Significantly decreased WBC's      Medication List       Accurate as of September 22, 2019 11:05 AM. If you have any questions, ask your nurse or doctor.        amLODipine 10 MG tablet Commonly known as: NORVASC Take 1 tablet (10 mg total) by mouth every evening.   chlorthalidone 25 MG tablet Commonly known as: HYGROTON Take 1 tablet (25 mg total) by mouth daily. Please keep upcoming appt in November with Dr. Tamala Julian before anymore refills. Thank you   finasteride 5 MG tablet Commonly known as: PROSCAR Take 1 tablet  (5 mg total) by mouth daily.   Fish Oil 1000 MG Caps Take 2 capsules by mouth 2 (two) times daily.   fluticasone 50 MCG/ACT nasal spray Commonly known as: FLONASE Place 1 spray into both nostrils 2 (two) times daily as needed for allergies or rhinitis.   glucose blood test strip Commonly known as: ONE TOUCH ULTRA TEST Use to check BG once daily.  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 Please make yearly appt with Dr. Tamala Julian for March before anymore refills. 1st attempt   metFORMIN 1000 MG tablet Commonly known as: GLUCOPHAGE TAKE 1 TABLET BY MOUTH TWICE DAILY WITH A MEAL   metoprolol succinate 100 MG 24 hr tablet Commonly known as: TOPROL-XL Take 1 tablet (100 mg total) by mouth daily. Take with or immediately following a meal.   omeprazole 20 MG capsule Commonly known as: PRILOSEC Take 1 capsule (20 mg total) by mouth daily.   ONE TOUCH ULTRA MINI w/Device Kit 1 each by Does not apply route 2 (two) times daily.   pantoprazole 40 MG tablet Commonly known as: PROTONIX Take 1 tablet  by mouth 2 (two) times daily.   potassium chloride SA 20 MEQ tablet Commonly known as: KLOR-CON Take 1 tablet (20 mEq total) by mouth daily.   pravastatin 40 MG tablet Commonly known as: PRAVACHOL Take 1 tablet (40 mg total) by mouth every evening.   warfarin 5 MG tablet Commonly known as: COUMADIN Take as directed by the anticoagulation clinic. If you are unsure how to take this medication, talk to your nurse or doctor. Original instructions: TAKE 1 & 1/2 (ONE & ONE-HALF) TABLETS BY MOUTH ONCE DAILY AT 6 IN THE EVENING        Objective:   BP (!) 143/58    Pulse (!) 56    Temp 98.4 F (36.9 C) (Temporal)    Ht _0  (1.651 m)    Wt 154 lb 6.4 oz (70 kg)    SpO2 100%    BMI 25.69 kg/m   Wt Readings from Last 3  Encounters:  09/22/19 154 lb 6.4 oz (70 kg)  09/03/19 156 lb 6.4 oz (70.9 kg)  07/29/19 158 lb 3.2 oz (71.8 kg)    Physical Exam Vitals signs and nursing note reviewed.  Constitutional:      General: He is not in acute distress.    Appearance: He is well-developed. He is not diaphoretic.  Eyes:     General: No scleral icterus.    Conjunctiva/sclera: Conjunctivae normal.  Neck:     Musculoskeletal: Neck supple.     Thyroid: No thyromegaly.  Cardiovascular:     Rate and Rhythm: Normal rate. Rhythm irregular.     Heart sounds: Normal heart sounds. No murmur.  Pulmonary:     Effort: Pulmonary effort is normal. No respiratory distress.     Breath sounds: Normal breath sounds. No wheezing.  Lymphadenopathy:     Cervical: No cervical adenopathy.  Neurological:     Mental Status: He is alert and oriented to person, place, and time.     Coordination: Coordination normal.  Psychiatric:        Behavior: Behavior normal.       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  Counseling provided for all of the vaccine components Orders Placed This Encounter  Procedures   CoaguChek XS/INR [place] Marlan Steward, MD [facility] 09/22/2019, 11:05 AM

## 2019-09-25 ENCOUNTER — Emergency Department
Admission: EM | Admit: 2019-09-25 | Discharge: 2019-09-25 | Disposition: A | Discharge status: Home / Self Care | Payer: Medicare Other | Attending: Emergency Medicine | Admitting: Emergency Medicine

## 2019-09-25 ENCOUNTER — Emergency Department: Payer: Medicare Other

## 2019-09-25 DIAGNOSIS — N451 Epididymitis: Secondary | ICD-10-CM

## 2019-09-25 DIAGNOSIS — N3001 Acute cystitis with hematuria: Secondary | ICD-10-CM | POA: Insufficient documentation

## 2019-09-25 DIAGNOSIS — N503 Cyst of epididymis: Secondary | ICD-10-CM | POA: Insufficient documentation

## 2019-09-25 LAB — VH URINALYSIS WITH MICROSCOPIC AND CULTURE IF INDICATED
Glucose, UA: NEGATIVE mg/dL
Ictotest: NEGATIVE
Ketones UA: 40 mg/dL — AB
Nitrite, UA: POSITIVE — AB
Protein, UR: 100 mg/dL — AB
Urine Specific Gravity: 1.02 (ref 1.001–1.040)
Urobilinogen, UA: 1 mg/dL — AB
pH, Urine: 5.5 pH (ref 5.0–8.0)

## 2019-09-25 MED ORDER — CIPROFLOXACIN HCL 250 MG PO TABS
250.0000 mg | ORAL_TABLET | Freq: Two times a day (BID) | ORAL | 0 refills | Status: AC
Start: 2019-09-25 — End: 2019-10-05

## 2019-09-25 NOTE — ED Notes (Signed)
Bladder scanned with 165 ml of urine in bladder.

## 2019-09-25 NOTE — Discharge Instructions (Signed)
Please rest and increase your fluid intake.  Please eat yogurt while you are taking the oral antibiotic.  Please always use a sterile and clean catheter prior to self catheterizing himself.  Please follow-up with Dr. Joelene Millin, [facility] based urologist, within 1 week by calling his office today to secure an immediate follow-up regarding your urinary retention, your left epididymitis, your bladder infection and for further evaluation and treatment at his discretion.  Please follow-up with Dr. Cleta Alberts, on-call primary care physician, within 1 week by calling his office to secure an immediate appointment for initial medical evaluation, complete physical examination, follow-up of your ongoing urinary retention, epididymitis, bladder infection and for further evaluation, testing, treatment and outpatient specialist referrals at his discretion.  Please return here immediately even via 911 if needed if your symptoms persist or worsen such as persistent or progressive urinary retention, for passing bloody urine, for loss of bowel or bladder control, for high fever or shaking chills, for progressive worsening of scrotal swelling, pain and redness, for one-sided body weakness or numbness, for abdominal pain physically active vomiting or diarrhea, for chest pain or shortness of breath, or for any new concerns, complaints, problems or symptoms.  Tylenol 650 mg every 4 hours as needed for pain or for fever of 100.3 F and higher.  Ibuprofen 600 mg every 6 hours as needed for pain, swelling or for fever of 102 F and higher (with food).  Please do not take ibuprofen continuously for over 72 hours to minimize the possibility of any side effects.                    Epididymitis  Inflammation of the epididymis can cause pain and swelling in your scrotum. The epididymis is a small tube next to the testicle that stores sperm. Epididymitis is often caused by an infection. In sexually active men, it is often caused  by a sexually transmitted infection (STI) such as chlamydia or gonorrhea. In boys and in men over 3, it can be from bacteria from other parts of the urinary tract (not an STI infection).  Symptoms may begin with pain in the lower belly (abdomen) or low back. The pain then spreads down into the scrotum. Often only one side is affected. The testicle and scrotum swell and become very painful and red. You may havefever and a burning when passing urine. Sometimes you may havea discharge from the penis.  Treatment is with antibiotics, and anti-inflammatory and pain medicines. The condition should get better over the first few days of treatment. But it will take several weeks for all the swelling and mild pain to go away. If your healthcare provider thinks that an STI is thecause, your sexual partners may need to be treated.  Home care  Here are some tips to help you care for yourself at home:   Support the scrotum. When lying down, place a rolled towel under the scrotum. When walking, use an athletic supporter or 2 pairs of jockey-style underwear.   To ease pain, put ice packs on the inflamed area. To make anice pack, put ice cubes in a plastic bag that seals at the top. Wrap the bag in a clean, thintowel. Never put an ice pack directly on the skin.   Take pain medicine as directed. You may useover-the-counter medicinesto control pain, unless another medicine was given. If you have long-term (chronic) liver or kidney disease, talk with your healthcare provider before taking these medicines. Also talk with  your provider if you've ever had a stomach ulcer or GI (gastrointestinal) bleeding.   Get some rest.  Rest in bed for the first few days until the fever, pain, and swelling get better. It may take several weeks for all of the swelling to go away.   Prevent constipation. Constipationcan make you strain. This makes the pain worse. Prevent constipationby eating natural laxatives. These include prunes, fresh  fruits, and whole-grain cereals.If needed, use a mild over-the-counter laxativefor constipation. Mineral oil can be used to keep the stools soft.   Wait to have sex. Don't have sex until you have finished all treatment and all symptoms have cleared.   Take all medicine as directed. Don't miss any doses. And don't stop taking your medicine early, even if you feel better.  Follow-up care  Follow up with your healthcare provider, or as advised, to be sure you are responding correctly to treatment. If a culture was taken, you may call for the result as directed. A culture test can ensure that you are on the correct antibiotic.  When to seek medical advice  Call your healthcare provider right awayif any of these occur:   Fever of 100.12F (38C) or higher, or as directed by your provider   More pain or swelling of the testicle after starting treatment   Pressure or pain in your bladder that gets worse   Unable to pass urine for 8 hours  [employer]Well last reviewed this educational content on 08/11/2018   2000-2020 [employer], [place]. [address] [place], [place] [address]. All rights reserved. This information is not intended as a substitute for professional medical care. Always follow your healthcare professional's instructions.          Bladder Infection,Male (Adult)    You have a bladder infection.  Urine is normally free of bacteria. But bacteria can get into the urinary tract from the skin around the rectum. Or it may travel in the blood from other parts of the body.  This is called a urinary tract infection (UTI). An infection can occur anywhere in the urinary tract. It could be in a kidney (pyelonephritis) or in the bladder (cystitis) and urethra (urethritis). The urethra is the tube that drains urine from the bladder through the tip of the penis.  The most common place for a UTI is in the bladder. This is called a bladder infection. Most bladder infections are easily treated. They are not serious  unless the infection spreads up to the kidney.  The terms bladder infection, UTI, and cystitis are often used to describe the same thing. But they arent always the same. Cystitis is an inflammation of the bladder. The most common cause of cystitis is an infection.  Keep in mind:   Infections in the urine are called UTIs.   Cystitis is often caused by a UTI.   Not all UTIs and cases of cystitis are bladder infections.   Bladder infections are the most common type of cystitis.  Symptoms of a bladder infection  The infection causes inflammation in the urethra and bladder. This inflammation causes many of the symptoms. The most common symptoms of a bladder infection are:   Pain or burning when urinating   Having to go more often than normal   Feeling like you need to goright away   Only a small amount of urine comes out   Blood in urine   Discomfort in your belly (abdomen), often in the lower belly, above the pubic  bone   Cloudy, strong, or bad-smelling urine   Unable to urinate (retention)   Urinary incontinence   Fever   Loss of appetite  Older adults may also feel confused.  Causes of a bladder infection  Bladder infections are not contagious. You can't get one from someone else, from a toilet seat, or from sharing a bath.  The most common cause of bladder infections is bacteria from the bowels. The bacteria get onto the skin around the opening of the urethra. From there they can get into the urine and travel up to the bladder. This causes inflammation and an infection. This often happens because of:   An enlarged prostate   Poor cleaning of the genitals   Procedures that put a tube in your bladder, such as a Foley catheter   Bowel incontinence   Older age   Not emptying your bladder (the urine stays there, giving the bacteria a chance to grow)   Dehydration (this lets urine to stay in the bladder longer)   Constipation (this can cause the bowels to push on the bladder or urethra and keep  the bladder from emptying)  Treatment  Bladder infections are treated with antibiotics. They often clear up quickly without complications. Treatment helps prevent a more serious kidney infection.  Medicines  Medicines can help in treating a bladder infection:   You may have been given phenazopyridine to ease burning when you urinate. It will cause your urine to be bright orange. It can stain clothing.   You may have been prescribed antibiotics. Take this medicine until you have finished it, even if you feel better. Taking all of the medicine will make sure the infection has cleared.  You can use acetaminophen or ibuprofen for pain,fever, or discomfort, unless another medicine was prescribed. You can also alternate them, or use both together. They work differently and are a different class of medicines, so taking them together is not an overdose.If you have chronic liver or kidney disease, talk with your healthcare provider before using these medicines. Also talk with your provider if Myrtis Ser had a stomach ulcer or GI (gastrointestinal) bleeding or are taking blood thinner medicines.  Home care  Here are some guidelines to help you care for yourself at home:   Drink plenty of fluids, unless your healthcare provider told you not to. Fluids will prevent dehydration and flush out your bladder.   Use good personal hygiene. Wipe from front to back after using the toilet, and clean your penis regularly. If you arent circumcised, retract the foreskin when cleaning.   Urinate more often, and dont try to hold it in for long periods of time, if possible.   Wear loose-fitting clothes and cotton underwear. Don't wear tight-fitting pants. This helps keep you clean and dry.   Change your diet to prevent constipation. This means eating more fresh foods and more fiber, and less junk and fatty foods.   Don't have sex until your symptoms are gone.   Don't have caffeine, alcohol, and spicy foods. These can irritate the  bladder.  Follow-up care  Follow up with your healthcare provider, or as advised, if all symptoms have not cleared up in 5 days. It's important to keep your follow-up appointment. You can talk with your provider to see if you need more tests of the urinary tract.This is especially important if you have infections that keep coming back.  If a culture was done, you will be told if your treatment needs to be  changed. If directed, you can callto find out the results.  If X-rays were taken, you will be told of any findings that may affect your care.  Call 911  Call 911 if any of these occur:   Trouble breathing   Trouble waking up   Feeling confused   Fainting or loss of consciousness   Fast heart rate  When to get medical advice  Call your healthcare provider right awayif any of these occur:   Fever of 100.68F (38C) or higher, or as directed by your provider   Your symptoms dont improve after 2 days of treatment   Back or b pain that gets worse   Repeated vomiting, or you arent able to keep medicine down   Weakness or dizziness  StayWell last reviewed this educational content on 09/10/2018   2000-2020 [employer], [place]. [address] [place], [place] [address]. All rights reserved. This information is not intended as a substitute for professional medical care. Always follow your healthcare professional's instructions.          Urinary Retention (Male)  Urinary retention is when you have trouble urinating. In some cases you may not be able to pass any urine at all. This condition occurs even though your bladder is full.   Causes  The most common cause of urinary retention in men is the bladder outlet being blocked. This can be due to an enlarged prostate gland or a bladder infection. Some medicines can also cause this problem. This condition is more likely to occur as men get older.   Symptoms  Common symptoms include:   Pain (not everyone has this)   Frequent urination   Feeling that the  bladder is still full after urinating   Not being able to control the release of urine (incontinence)   Swollen belly (abdomen)  Treatment  This condition is treated by putting a tube (catheter) into the bladder to drain the urine. This gives relief right away. The catheter may need to stay in place for a few days. The catheter has a balloon on the tip. This is inflated after the catheter is put in the bladder. This prevents the catheter from falling out.     Home care   If you were given antibiotics, take them until they are used up, or your healthcare provider tells you to stop. It's important to finish the antibiotics even if you feel better. This is to make sure your infection has cleared.   If a catheter was left in place, it's important to keep bacteria from getting into the collection bag. Don't disconnect the catheter from the collection bag.   Use a leg band to secure the drainage tube, so it does not pull on the catheter. Drain the collection bag when it becomes full using the drain spout at the bottom of the bag.   Don't pull on or try to take out your catheter. This will harm your urethra. The catheter must be removed by a healthcare provider.    Follow-up care  Follow up with your healthcare provider, or as advised.  If a catheter was left in place, it can often be removed in 3 to 7 days. Some conditions require the catheter to stay in longer. Your provider will tell you when to come back to have the catheter removed.   When to get medical advice  Call your healthcare provider right away if any of these occur:   Fever of 100.68F (38C) or higher,  or as directed by your provider   Bladder or lower-belly pain or fullness   Belly swelling, nausea, vomiting, or back pain   Blood or urine leakage around the catheter   Bloody urine coming from the catheter (if a new symptom)   Weakness, dizziness, or fainting   Confusion or change in normal level of alertness   If a catheter was left in place,  see your provider if the catheter:  ? Falls out  ? Stops draining for 6 hours  StayWell last reviewed this educational content on 12/11/2018   2000-2020 [employer], [place]. [address], [place], [place] [address]. All rights reserved. This information is not intended as a substitute for professional medical care. Always follow your healthcare professional's instructions.

## 2019-09-25 NOTE — ED Provider Notes (Signed)
[facility]  Emergency Department       Patient Name: Oscar Mclean, Oscar Mclean Patient DOB:  [birth year]   Encounter Date:  09/25/2019 Age: 78 y.o. male   Attending ED Physician: Christ Kick, MD MRN:  [id number]   Room:  EX7/EX7-A PCP: Pcp, None, MD      Diagnosis / Disposition:   Final Impression  1. Left epididymitis    2. Epididymal cyst    3. Acute cystitis with hematuria        Disposition  ED Disposition     ED Disposition Condition Date/Time Comment    Discharge  Thu Sep 25, 2019  2:08 PM Latricia Heft discharge to home/self care.    Condition at disposition: Stable          Follow up  Fayrene Fearing, MD  [address]  [address] [place] [address]  [phone]    Schedule an appointment as soon as possible for a visit in 1 week  Medical office follow-up within 1 week for initial medical evaluation, complete physical examination, follow-up of urinary retention, epididymitis, cystitis and for further evaluation, testing, treatment and outpatient specialist referrals at his discret    Deland Pretty, MD  [address]  [place] [address]  [phone]    Schedule an appointment as soon as possible for a visit in 1 week  Urology office follow-up within 1 week for further evaluation and treatment of urinary retention, epididymitis and cystitis.      Prescriptions  New Prescriptions    CIPROFLOXACIN (CIPRO) 250 MG TABLET    Take 1 tablet (250 mg total) by mouth 2 (two) times daily for 10 days         History of Presenting Illness:   Chief complaint: Urinary Retention and Urinary Tract Infection Symptoms    HPI/ROS is limited by: none  HPI/ROS given by: patient    Location: Genitourinary  Quality: Scrotal enlargement  Duration: 2 days ago  Severity: moderate          Nursing Notes reviewed and acknowledged.    Oscar Mclean is a 78 y.o. male who presents with acute onset 2 days ago of scrotal swelling, worse on the left side.  He denies any traumatic injuries.  He also has been  complaining of brownish urethral/penile discharge over the past year.  He claims he was seen here 1 year ago with urinary retention and had an indwelling Foley catheter left in place after being drained of 2 L of urine.  A review of the visit history of the patient states that he was seen on May 10, 2013 because of urinary retention.  He states that he did not follow-up with a urologist and instead, he removed the Foley catheter himself.  He admits that he has been using the same Foley catheter for the past year, self catheterizing himself 2-3 times a day as he cannot void spontaneously.  He states that a few months before the urinary retention ER visit, he had unprotected sex with a prostitute.  No traumatic injuries.  No fever or shaking chills.  No abdominal pain, vomiting or diarrhea.  He denies any sick contact or exposure to any known or suspected COVID-19 patients.    Has no known drug allergies.  He smokes 1-1/2 packs/day but denies any alcohol or recreational drug use.  He admits that he has possible COPD but has not been diagnosed with it officially.  He denies any  diabetes, hypertension, hypercholesterolemia, CVA or CAD.  No prior history of asthma.    In addition to the above history, please see nursing notes. Allergies, meds, past medical, family, social hx, and the results of the diagnostic studies performed have been reviewed by myself.      Review of Systems   ENT:  No ear pain. No congestion.  No discharge. No sore throat.  No difficulty swallowing.  Respiratory: No cough.  No shortness of breath.  Cardiovascular: No chest pain.  No palpitations.  GI: No Nausea, Vomiting, Diarrhea, or GI bleeding  GU:  No dysuria. No hematuria.  Per HPI.  Neurological:  No headache.  No weakness.  Musculoskeletal:  No unusual myalgias or weakness  Skin:  No rash.  No skin lesions.  Endocrine:  No weight change, polyuria, or polydypsia  Psychiatric:  No depression.  No anxiety.      All other systems reviewed and  negative except as above, pertinent findings in HPI.          Allergies / Medications:   Pt has No Known Allergies.    Current/Home Medications    TAMSULOSIN (FLOMAX) 0.4 MG CAPS    Take 1 capsule (0.4 mg total) by mouth nightly. Take until stone passes         Past History:   Medical: Pt has no past medical history on file.    Surgical: Pt  has no past surgical history on file.    Family: The family history includes Heart disease in his father and mother.    Social: Pt reports that he has been smoking. He has been smoking about 0.50 packs per day. He has never used smokeless tobacco. He reports that he does not drink alcohol or use drugs.      Physical Exam:   Constitutional: Vital signs reviewed.  He is awake, alert and nontoxic appearing.  His speech is clear and he is able to answer my questions readily and spontaneously.  His temperature is 97.9 F.  Head: Normocephalic, atraumatic; PERRLA, EOMI.  No pharyngeal erythema or exudates.  No pooling of saliva or upper airway compromise noted.  Oral mucosa is moist.  No nasal discharge.  Eyes: Conjunctiva and sclera are normal.  No injection or discharge.  Ears, Nose, Throat:  Normal external examination of the nose and ears.    Neck: Normal range of motion. Non-tender. Trachea midline.  No meningeal signs or nuchal rigidity noted.  Respiratory/Chest: No respiratory distress. Breath sounds clear to auscultation without any adventitious breath sounds.  No tachypnea or hypoxemia noted.  Cardiac: regular rate and rhythm, no murmur, rubs, gallops or tachycardia noted.  Abdomen: bowel sounds normoactive, soft and nontender on palpation without any peritoneal signs.  Back:  No paraspinous tenderness, no CVAT on percussion bilaterally.  No midline vertebral spine tenderness, with crepitus or step-off deformity noted.  Extremities: no evidence of injury, no edema, positive pulses with good capillary refill; moderate upper leg swelling or calf tenderness noted.  Neurological:  No focal motor deficits by observation. Speech normal.  Equal grip with no facial droop.  Skin: Warm and dry. No rash.  Psychiatric: Alert and conversant.  Normal affect.  Normal insight.  Genitalia: Examination was conducted with ER technician Hallie at the bedside as chaperone revealed that the patient is circumcised.  There is small amount of brown discharge expressed from the urethra by the patient himself.  Examination of the scrotum revealed swelling and mild redness of the scrotal region  which is actually worse on the left side.  No palpable mass noted.  No significant tenderness on palpation.        MDM:         Course in ED:   This 78 year old male patient presents with scrotal swelling noted 2 days ago.  He has been self catheterizing himself for at least 1 year, 2-3 times per day with the same Foley catheter.he was discharged home with on a visit he claims that happened 1 year ago.  Review of the medical records revealed that he was last seen here on May 10, 2013.  He denies any traumatic injuries.  He self catheterizes 2-3 times per day and he is unable to void spontaneously.  He had unprotected sex 2 months before the urinary retention visit a year ago.  No sick contact.  On examination, he is awake, alert, nontoxic appearing.  His speech is clear.  Temperature is 97.9 F.  The throat is benign with no pharyngeal erythema or exudates.  The lungs are clear to auscultation without any adventitious breath sounds, tachypnea or hypoxemia.  His heart is regular with no rubs, gallops, murmurs or tachycardia.  His abdomen soft, nontender palpation without any peritoneal signs.  No CVA tenderness to percussion.  No unilateral leg swelling or calf tenderness.  He has no focal neurological deficits. Genitalia: Examination was conducted with ER technician Hallie at the bedside as chaperone revealed that the patient is circumcised.  There is small amount of brown discharge expressed from the urethra by the patient  himself.  Examination of the scrotum revealed swelling and mild redness of the scrotal region which is actually worse on the left side.  No palpable mass noted.  No significant tenderness on palpation.      Urinary bladder scan was done which showed 165 mL urine within the bladder.    Straight catheter urinalysis is yellow and clear with 100 protein, 40 ketones, moderate blood, positive nitrite, small leukocyte esterase, 10-15 WBC and moderate bacteria with only 1-2 RBC.  This is consistent with UTI, most likely acute cystitis with hematuria.  A urine culture has been submitted.  Ultrasound of the scrotum showed an impression of left epididymis enlargement with increased vascularity which can be seen with epididymitis.  No intratesticular abnormality.  Small bilateral hydroceles.  The right testicle is normal in size, echogenicity and vascularity measuring 33 x 24 x 33 mm.  No intratesticular mass.  The left testicle is normal in size, echogenicity and vascularity measuring 41 x 23 x 35 mm.  No acute testicular mass.  7 mm cyst in the head of the right epididymis.  Several other smaller cyst in the epididymis.  Left epididymis enlarged with increased vascularity.  No focal mass.  Small bilateral hydroceles.  A copy of all of the test results were provided to the patient for follow-up purposes and I highlighted and discussed all the significant findings with him.  Because of the epididymitis, he will be treated with Cipro for 7 days and will be provided with the contact information of Dr. Joelene Millin of urology for follow-up within 1 week for follow-up of epididymitis, ongoing inability to spontaneously void and UTI and Dr. Cleta Alberts, primary care physician on call for follow-up within 1 week for initial medical evaluation, complete physical examination and follow-up of epididymitis and urinary tract infection for further evaluation, testing, treatment and outpatient specialist referrals at his discretion.  Patient  verbalized understanding and agreement with the treatment plan,  disposition plan and primary care and urology follow-up plan within 1 week for further evaluation and treatment.  Based on the patient's clinical presentation, medical history, physical examination and ER work-up, there is no evidence indicate the presence of epidural abscess, cauda equina syndrome or definite neurogenic bladder.  The patient most likely has mechanical obstruction such as enlarged prostate, etc. causing his inability to spontaneously void.  He has no acute surgical intra-abdominal process noted.  No evidence of renal colic or pyelonephritis.  The patient verbalized understanding and agreement with treatment plan, disposition plan, primary care and urology follow-up within 1 week.      Diagnostic Results:   The results of the diagnostic studies have been reviewed by myself:    Radiologic Studies  US Scrotum And Testicles    Result Date: 09/25/2019  Left epididymis enlargement with increased vascularity which can be seen with epididymitis. No intratesticular abnormality. Small bilateral hydroceles. ReadingStation:[id number]      Lab Studies  Labs Reviewed   VH URINALYSIS WITH MICROSCOPIC AND CULTURE IF INDICATED       - Abnormal; Notable for the following components:       Result Value    Protein, UR 100 (*)     Ketones UA 40 (*)     Bilirubin, UA Small (*)     Blood, UA Moderate (*)     Nitrite, UA Positive (*)     Urobilinogen, UA 1.0 (*)     Leukocyte Esterase, UA Small (*)     WBC, UA 10-15 (*)     Bacteria, UA Moderate (*)     All other components within normal limits    Narrative:     A Urine Culture has been ordered based upon the Positive UA results.   VH STD AMPLIFIED DNA PROBE    Narrative:      Specimen source - Vaginal Swab   VH CULTURE, URINE       Results     Procedure Component Value Units Date/Time    Urinalysis w Microscopic and Culture if Indicated [[id number]]  (Abnormal) Collected: 09/25/19 1253    Specimen: Urine, Random  Updated: 09/25/19 1311     Color, UA Yellow     Clarity, UA Clear     Urine Specific Gravity 1.020     pH, Urine 5.5 pH      Protein, UR 100 mg/dL      Glucose, UA Negative mg/dL      Ketones UA 40 mg/dL      Bilirubin, UA Small mg/dL      Blood, UA Moderate mg/dL      Nitrite, UA Positive     Urobilinogen, UA 1.0 mg/dL      Leukocyte Esterase, UA Small Leu/uL      UR Micro Performed     WBC, UA 10-15 /hpf      RBC, UA 1-2 /hpf      Bacteria, UA Moderate /hpf      Ictotest Negative    Narrative:      A Urine Culture has been ordered based upon the Positive UA results.            EKG/ Procedure / Critical Care time:     EKG: none          Total time providing critical care:      ATTESTATIONS   This chart was generated by an EMR and may contain errors or additions/omissions not intended by the user.  Christ Kick, M.D.     Christ Kick, MD  09/25/19 1414

## 2019-09-25 NOTE — ED Triage Notes (Signed)
Pt stated he has been reusing a cath that he was D/C from he with about a year ago due to unable to void. Also stated he thinks he has a STI. States he has a brown discharge that drains from his penis and has had this for about a year. Stated he has unprotected intercourse with prostitutes.

## 2019-09-26 LAB — VH STD AMPLIFIED DNA PROBE
Chlamydia trachomatis: NEGATIVE
Neisseria gonorrhoeae: NEGATIVE

## 2019-09-29 DIAGNOSIS — K922 Gastrointestinal hemorrhage, unspecified: Secondary | ICD-10-CM | POA: Diagnosis not present

## 2019-09-29 DIAGNOSIS — Z01812 Encounter for preprocedural laboratory examination: Secondary | ICD-10-CM | POA: Diagnosis not present

## 2019-10-06 DIAGNOSIS — K317 Polyp of stomach and duodenum: Secondary | ICD-10-CM | POA: Diagnosis not present

## 2019-10-06 DIAGNOSIS — E119 Type 2 diabetes mellitus without complications: Secondary | ICD-10-CM | POA: Diagnosis not present

## 2019-10-06 DIAGNOSIS — D131 Benign neoplasm of stomach: Secondary | ICD-10-CM | POA: Diagnosis not present

## 2019-10-06 DIAGNOSIS — K259 Gastric ulcer, unspecified as acute or chronic, without hemorrhage or perforation: Secondary | ICD-10-CM | POA: Diagnosis not present

## 2019-10-06 DIAGNOSIS — K3189 Other diseases of stomach and duodenum: Secondary | ICD-10-CM | POA: Diagnosis not present

## 2019-10-06 DIAGNOSIS — Z7984 Long term (current) use of oral hypoglycemic drugs: Secondary | ICD-10-CM | POA: Diagnosis not present

## 2019-10-14 NOTE — Progress Notes (Signed)
Cardiology Office Note:    Date:  10/15/2019   ID:  Alan Netters., DOB 04/13/1941, MRN [id number]  PCP:  Dettinger, Fransisca Kaufmann, MD  Cardiologist:  Alan Grooms, MD   Referring MD: Dettinger, Fransisca Kaufmann, MD   Chief Complaint  Patient presents with  . Hyperlipidemia  . Hypertension  . Follow-up    Hypertrophic cardiomyopathy    History of Present Illness:    Alan Woodrick. is a 78 y.o. male with a hx of hypertrophic cardiomyopathy, essential hypertension, chronic anticoagulation therapy, and paroxysmal atrial fibrillation.  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  Past Medical History:  Diagnosis Date  . Adenomatous colon polyp   . Agranulocytosis ([place])   . AKI (acute kidney injury) ([place]) 09/22/2019  . Allergy   . Atrial fibrillation ([place]) 04/19/2016  . Cataract    small  . Diabetes mellitus   . Diverticulosis   . ED (erectile dysfunction)   . 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 Gallstones   . GERD (gastroesophageal reflux disease)   . Gout   . Heart murmur   . Hyperlipidemia   . Hypertension   . Internal hemorrhoids   . Pancreatitis   . 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  . Prostate cancer ([place])    Followed by Dr. Jeffie [name]     Past Surgical History:  Procedure Laterality Date  . CHOLECYSTECTOMY N/A 11/26/2015   Procedure: LAPAROSCOPIC CHOLECYSTECTOMY;  Surgeon: Aviva Signs, MD;  Location: [facility] ORS;  Service: General;  Laterality: N/A;  . COLONOSCOPY  2012   [name]: moderate sigmoid diverticulosis, internal hemorrhoids. next tcs 2017  . FLEXIBLE BRONCHOSCOPY Bilateral 04/14/2016   Procedure: FLEXIBLE BRONCHOSCOPY;  Surgeon: Sinda Du, MD;  Location: [facility] ENDO SUITE;  Service: Cardiopulmonary;  Laterality: Bilateral;  . SIGMOIDOSCOPY    . UMBILICAL HERNIA REPAIR N/A 11/26/2015   Procedure: UMBILICAL HERNIORRHAPHY;  Surgeon: Aviva Signs, MD;  Location: [facility] ORS;  Service: General;   Laterality: N/A;    Current Medications: Current Meds  Medication Sig  . amLODipine (NORVASC) 10 MG tablet Take 1 tablet (10 mg total) by mouth every evening.  . Blood Glucose Monitoring Suppl (ONE TOUCH ULTRA MINI) w/Device KIT 1 each by Does not apply route 2 (two) times daily.  . chlorthalidone (HYGROTON) 25 MG tablet Take 1 tablet (25 mg total) by mouth daily. Please keep upcoming appt in November with Dr. Tamala Julian before anymore refills. Thank you  . Ferrous Sulfate (IRON) 325 (65 Fe) MG TABS Take 1 tablet by mouth once daily with breakfast  . finasteride (PROSCAR) 5 MG tablet Take 1 tablet (5 mg total) by mouth daily.  . fluticasone (FLONASE) 50 MCG/ACT nasal spray Place 1 spray into both nostrils 2 (two) times daily as needed for allergies or rhinitis.  Marland Kitchen glucose blood (ONE TOUCH ULTRA TEST) test strip Use to check BG once daily.  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 lisinopril (ZESTRIL) 20 MG tablet Take 1.5 tablets (30 mg total) by mouth daily. Please make yearly appt with Dr. Tamala Julian for March before anymore refills. 1st attempt  . metFORMIN (GLUCOPHAGE) 1000 MG tablet TAKE 1 TABLET BY MOUTH TWICE DAILY WITH A MEAL  . metoprolol succinate (TOPROL-XL) 100 MG 24 hr tablet Take 1 tablet (100 mg total) by mouth daily. Take with or immediately following a meal.  . omeprazole (PRILOSEC) 20 MG capsule Take 1 capsule (20 mg total) by mouth daily.  . pantoprazole (PROTONIX)  40 MG tablet Take 1 tablet by mouth 2 (two) times daily.  . potassium chloride SA (K-DUR) 20 MEQ tablet Take 1 tablet (20 mEq total) by mouth daily.  . pravastatin (PRAVACHOL) 40 MG tablet Take 1 tablet (40 mg total) by mouth every evening.  . warfarin (COUMADIN) 5 MG tablet TAKE 1 & 1/2 (ONE & ONE-HALF) TABLETS BY MOUTH ONCE DAILY AT 6 IN THE EVENING     Allergies:   Allopurinol and Colchicine   Social History   Socioeconomic History  . Marital status: Married    Spouse name: Arville Go  . Number of children: 4  . Years of  education: Not on file  . Highest education level: 10th grade  Occupational History  . Occupation: Retired     Fish farm manager: [employer]  Social Needs  . Financial resource strain: Not hard at all  . Food insecurity    Worry: Never true    Inability: Never true  . Transportation needs    Medical: No    Non-medical: No  Tobacco Use  . Smoking status: Former Smoker    Types: Cigarettes    Quit date: 12/11/1986    Years since quitting: 32.8  . Smokeless tobacco: Never Used  . Tobacco comment: Quit x 50 years  Substance and Sexual Activity  . Alcohol use: No  . Drug use: No  . Sexual activity: Yes  Lifestyle  . Physical activity    Days per week: 3 days    Minutes per session: 30 min  . Stress: Not at all  Relationships  . Social connections    Talks on phone: More than three times a week    Gets together: More than three times a week    Attends religious service: More than 4 times per year    Active member of club or organization: Yes    Attends meetings of clubs or organizations: More than 4 times per year    Relationship status: Married  Other Topics Concern  . Not on file  Social History Narrative  . Not on file     Family History: The patient's family history includes Alzheimer's disease in his mother; Brain cancer in his sister; Breast cancer in his sister; Deep vein thrombosis in his brother; Hypertension in his brother, father, mother, and sister; Stroke in his mother. There is no history of Colon cancer or Pancreatic disease.  ROS:   Please see the history of present illness.    He has no complaints outside of his cardiovascular system.  All other systems reviewed and are negative.  EKGs/Labs/Other Studies Reviewed:    The following studies were reviewed today:  2D Doppler echocardiogram 05/16/2017:  Study Conclusions  - Left ventricle: The cavity size was normal. Wall thickness was   increased in a pattern of moderate LVH. There was severe   asymmetric septal  hypertrophy. No definite significant LVOT   gradient. Systolic function was vigorous. The estimated ejection   fraction was in the range of 65% to 70%. Wall motion was normal;   there were no regional wall motion abnormalities. Doppler   parameters are consistent with abnormal left ventricular   relaxation (grade 1 diastolic dysfunction). - Aortic valve: Mildly calcified annulus. Trileaflet. There was   mild regurgitation. - Mitral valve: Mildly calcified annulus. There was trivial   regurgitation. - Left atrium: The atrium was mildly dilated. - Right atrium: Central venous pressure (est): 3 mm Hg. - Tricuspid valve: There was trivial regurgitation. - Pulmonary  arteries: PA peak pressure: 29 mm Hg (S). - Pericardium, extracardiac: There was no pericardial effusion. - Septal thickness 17 mm and free wall thickness 19 mmHg. Impressions:  - Moderate LVH with septal hypertrophy, no definite significant   LVOT gradient, LVEF 65-70%. Grade 1 diastolic dysfunction. Mild   left atrial enlargement. Mildly calcified mitral annulus with   trivial mitral regurgitation. Mildly calcified aortic annulus   with mild aortic regurgitation. Trivial tricuspid regurgitation   with PASP 29 mmHg.  EKG:  EKG demonstrates sinus bradycardia 55 bpm, normal PR interval, left atrial abnormality, prominent voltage consistent with LVH and strain/secondary T wave abnormality.  There is no change compared to November 2018.  Recent Labs: 09/10/2019: ALT 9; BUN 12; Creatinine, Ser 1.21; Hemoglobin 9.0; Platelets 297; Potassium 3.7; Sodium 140  Recent Lipid Panel    Component Value Date/Time   CHOL 67 (L) 09/10/2019 1256   CHOL 111 04/29/2013 1017   TRIG 89 09/10/2019 1256   TRIG 162 (H) 11/05/2013 1306   TRIG 236 (H) 04/29/2013 1017   HDL 34 (L) 09/10/2019 1256   HDL 33 (L) 11/05/2013 1306   HDL 31 (L) 04/29/2013 1017   CHOLHDL 2.0 09/10/2019 1256   LDLCALC 15 09/10/2019 1256   LDLCALC 44 11/05/2013 1306    LDLCALC 33 04/29/2013 1017    Physical Exam:    VS:  BP 136/62   Pulse (!) 55   Ht '5\' 5"'  (1.651 m)   Wt 156 lb 6.4 oz (70.9 kg)   SpO2 98%   BMI 26.03 kg/m     Wt Readings from Last 3 Encounters:  10/15/19 156 lb 6.4 oz (70.9 kg)  09/22/19 154 lb 6.4 oz (70 kg)  09/03/19 156 lb 6.4 oz (70.9 kg)     GEN: Compatible with age. No acute distress HEENT: Normal NECK: No JVD. LYMPHATICS: No lymphadenopathy CARDIAC:  RRR without murmur, gallop, or edema. VASCULAR:  Normal Pulses. No bruits. RESPIRATORY:  Clear to auscultation without rales, wheezing or rhonchi  ABDOMEN: Soft, non-tender, non-distended, No pulsatile mass, MUSCULOSKELETAL: No deformity  SKIN: Warm and dry NEUROLOGIC:  Alert and oriented x 3 PSYCHIATRIC:  Normal affect   ASSESSMENT:    1. Paroxysmal atrial fibrillation (HCC)   2. Hypertrophic cardiomyopathy ([place])   3. Long term (current) use of anticoagulants [Z79.01]   4. Essential hypertension   5. Hyperlipidemia associated with type 2 diabetes mellitus ([place])   6. Type 2 diabetes mellitus without complication, without long-term current use of insulin ([place])   7. Educated about COVID-19 virus infection    PLAN:    In order of problems listed above:  1. No evidence of atrial for ablation currently.  He is on Coumadin. 2. Concentric hypertrophy without evidence of LV outflow obstruction.  Septal thickness and free wall thickness are equivalent. 3. Continue Coumadin therapy 4. Blood pressure is in excellent range 5. Target LDL less than 70.  Most recent value was 43. 6. Hemoglobin A1c 5.5 in September. 7. The 3 W'S being adhered to and endorses lifestyle changes.   Medication Adjustments/Labs and Tests Ordered: Current medicines are reviewed at length with the patient today.  Concerns regarding medicines are outlined above.  Orders Placed This Encounter  Procedures  . EKG 12-Lead   No orders of the defined types were placed in this encounter.    Patient Instructions  Medication Instructions:  Your physician recommends that you continue on your current medications as directed. Please refer to the Current Medication list given  to you today.  *If you need a refill on your cardiac medications before your next appointment, please call your pharmacy*  Lab Work: None If you have labs (blood work) drawn today and your tests are completely normal, you will receive your results only by: Marland Kitchen MyChart Message (if you have MyChart) OR . A paper copy in the mail If you have any lab test that is abnormal or we need to change your treatment, we will call you to review the results.  Testing/Procedures: None  Follow-Up: At [facility], you and your health needs are our priority.  As part of our continuing mission to provide you with exceptional heart care, we have created designated Provider Care Teams.  These Care Teams include your primary Cardiologist (physician) and Advanced Practice Providers (APPs -  Physician Assistants and Nurse Practitioners) who all work together to provide you with the care you need, when you need it.  Your next appointment:   12 months  The format for your next appointment:   In Person  Provider:   You may see Alan Grooms, MD or one of the following Advanced Practice Providers on your designated Care Team:    Truitt Merle, NP  Cecilie Kicks, NP  Kathyrn Drown, NP   Other Instructions      Signed, Alan Grooms, MD  10/15/2019 3:40 PM    [place]

## 2019-10-15 ENCOUNTER — Encounter: Payer: Self-pay | Admitting: Interventional Cardiology

## 2019-10-15 ENCOUNTER — Other Ambulatory Visit: Payer: Self-pay

## 2019-10-15 ENCOUNTER — Ambulatory Visit: Payer: Medicare Other | Admitting: Interventional Cardiology

## 2019-10-15 VITALS — BP 136/62 | HR 55 | Ht 65.0 in | Wt 156.4 lb

## 2019-10-15 DIAGNOSIS — Z7901 Long term (current) use of anticoagulants: Secondary | ICD-10-CM | POA: Diagnosis not present

## 2019-10-15 DIAGNOSIS — I10 Essential (primary) hypertension: Secondary | ICD-10-CM | POA: Diagnosis not present

## 2019-10-15 DIAGNOSIS — E1169 Type 2 diabetes mellitus with other specified complication: Secondary | ICD-10-CM

## 2019-10-15 DIAGNOSIS — I480 Paroxysmal atrial fibrillation: Secondary | ICD-10-CM | POA: Diagnosis not present

## 2019-10-15 DIAGNOSIS — E785 Hyperlipidemia, unspecified: Secondary | ICD-10-CM

## 2019-10-15 DIAGNOSIS — E119 Type 2 diabetes mellitus without complications: Secondary | ICD-10-CM

## 2019-10-15 DIAGNOSIS — I422 Other hypertrophic cardiomyopathy: Secondary | ICD-10-CM | POA: Diagnosis not present

## 2019-10-15 DIAGNOSIS — Z7189 Other specified counseling: Secondary | ICD-10-CM

## 2019-10-15 NOTE — Patient Instructions (Signed)
Medication Instructions:  Your physician recommends that you continue on your current medications as directed. Please refer to the Current Medication list given to you today.  *If you need a refill on your cardiac medications before your next appointment, please call your pharmacy*  Lab Work: None If you have labs (blood work) drawn today and your tests are completely normal, you will receive your results only by: . MyChart Message (if you have MyChart) OR . A paper copy in the mail If you have any lab test that is abnormal or we need to change your treatment, we will call you to review the results.  Testing/Procedures: None  Follow-Up: At [facility], you and your health needs are our priority.  As part of our continuing mission to provide you with exceptional heart care, we have created designated Provider Care Teams.  These Care Teams include your primary Cardiologist (physician) and Advanced Practice Providers (APPs -  Physician Assistants and Nurse Practitioners) who all work together to provide you with the care you need, when you need it.  Your next appointment:   12 month(s)  The format for your next appointment:   In Person  Provider:   You may see Henry W Smith III, MD or one of the following Advanced Practice Providers on your designated Care Team:    Lori Gerhardt, NP  Laura Ingold, NP  Jill McDaniel, NP   Other Instructions   

## 2019-10-20 ENCOUNTER — Other Ambulatory Visit: Payer: Self-pay

## 2019-10-21 ENCOUNTER — Encounter: Payer: Self-pay | Admitting: Family Medicine

## 2019-10-21 ENCOUNTER — Ambulatory Visit (INDEPENDENT_AMBULATORY_CARE_PROVIDER_SITE_OTHER): Payer: Medicare Other | Admitting: Family Medicine

## 2019-10-21 ENCOUNTER — Other Ambulatory Visit: Payer: Self-pay

## 2019-10-21 VITALS — BP 138/54 | HR 63 | Temp 98.6°F | Wt 156.2 lb

## 2019-10-21 DIAGNOSIS — D6859 Other primary thrombophilia: Secondary | ICD-10-CM

## 2019-10-21 DIAGNOSIS — Z86718 Personal history of other venous thrombosis and embolism: Secondary | ICD-10-CM | POA: Diagnosis not present

## 2019-10-21 DIAGNOSIS — Z7901 Long term (current) use of anticoagulants: Secondary | ICD-10-CM | POA: Diagnosis not present

## 2019-10-21 LAB — COAGUCHEK XS/INR WAIVED
INR: 1.8 — ABNORMAL HIGH (ref 0.9–1.1)
Prothrombin Time: 21.6 s

## 2019-10-21 NOTE — Progress Notes (Signed)
BP (!) 138/54   Pulse 63   Temp 98.6 F (37 C) (Temporal)   Wt 156 lb 3.2 oz (70.9 kg)   SpO2 98%   BMI 25.99 kg/m    Subjective:   Patient ID: Alan Netters., male    DOB: 12/03/1941, 78 y.o.   MRN: [id number]  HPI: Alan Edmond. is a 78 y.o. male presenting on 10/21/2019 for Coagulation Disorder   HPI Patient is coming in for Coumadin recheck today.  He is currently on Coumadin for accessible A. fib.  He denies any bruising or bleeding.  He denies any chest pain or palpitations.  Relevant past medical, surgical, family and social history reviewed and updated as indicated. Interim medical history since our last visit reviewed. Allergies and medications reviewed and updated.  Review of Systems  Constitutional: Negative for chills and fever.  Respiratory: Negative for shortness of breath and wheezing.   Cardiovascular: Positive for palpitations. Negative for chest pain and leg swelling.  Musculoskeletal: Negative for back pain and gait problem.  Skin: Negative for rash.  Neurological: Negative for dizziness, weakness and light-headedness.  All other systems reviewed and are negative.   Per HPI unless specifically indicated above   Objective:   BP (!) 138/54   Pulse 63   Temp 98.6 F (37 C) (Temporal)   Wt 156 lb 3.2 oz (70.9 kg)   SpO2 98%   BMI 25.99 kg/m   Wt Readings from Last 3 Encounters:  10/21/19 156 lb 3.2 oz (70.9 kg)  10/15/19 156 lb 6.4 oz (70.9 kg)  09/22/19 154 lb 6.4 oz (70 kg)    Physical Exam Vitals signs and nursing note reviewed.  Constitutional:      General: He is not in acute distress.    Appearance: He is well-developed. He is not diaphoretic.  Eyes:     General: No scleral icterus.    Conjunctiva/sclera: Conjunctivae normal.  Neck:     Musculoskeletal: Neck supple.     Thyroid: No thyromegaly.  Cardiovascular:     Rate and Rhythm: Normal rate. Rhythm irregular.     Heart sounds: Normal heart sounds. No murmur.  Pulmonary:   Effort: Pulmonary effort is normal. No respiratory distress.     Breath sounds: Normal breath sounds. No wheezing.  Musculoskeletal: Normal range of motion.  Skin:    General: Skin is warm and dry.     Findings: No rash.  Neurological:     Mental Status: He is alert and oriented to person, place, and time.     Coordination: Coordination normal.  Psychiatric:        Behavior: Behavior normal.     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  Counseling provided for all of the vaccine components Orders Placed This Encounter  Procedures  . inr FINGERSTICK    Caryl Pina, MD [facility] 10/21/2019, 4:27 PM

## 2019-11-03 ENCOUNTER — Other Ambulatory Visit: Payer: Self-pay | Admitting: Interventional Cardiology

## 2019-11-12 ENCOUNTER — Other Ambulatory Visit: Payer: Self-pay | Admitting: Family Medicine

## 2019-11-21 ENCOUNTER — Other Ambulatory Visit: Payer: Self-pay

## 2019-11-21 ENCOUNTER — Ambulatory Visit (INDEPENDENT_AMBULATORY_CARE_PROVIDER_SITE_OTHER): Payer: Medicare Other | Admitting: Urology

## 2019-11-21 DIAGNOSIS — R3912 Poor urinary stream: Secondary | ICD-10-CM | POA: Diagnosis not present

## 2019-11-21 DIAGNOSIS — N401 Enlarged prostate with lower urinary tract symptoms: Secondary | ICD-10-CM | POA: Diagnosis not present

## 2019-11-21 DIAGNOSIS — C61 Malignant neoplasm of prostate: Secondary | ICD-10-CM

## 2019-11-21 DIAGNOSIS — R972 Elevated prostate specific antigen [PSA]: Secondary | ICD-10-CM | POA: Diagnosis not present

## 2019-11-24 ENCOUNTER — Other Ambulatory Visit: Payer: Self-pay | Admitting: Urology

## 2019-11-24 DIAGNOSIS — C61 Malignant neoplasm of prostate: Secondary | ICD-10-CM

## 2019-11-25 ENCOUNTER — Other Ambulatory Visit: Payer: Self-pay | Admitting: Urology

## 2019-11-26 ENCOUNTER — Other Ambulatory Visit: Payer: Self-pay

## 2019-11-27 ENCOUNTER — Ambulatory Visit (INDEPENDENT_AMBULATORY_CARE_PROVIDER_SITE_OTHER): Payer: Medicare Other | Admitting: Family Medicine

## 2019-11-27 ENCOUNTER — Other Ambulatory Visit: Payer: Self-pay

## 2019-11-27 ENCOUNTER — Encounter: Payer: Self-pay | Admitting: Family Medicine

## 2019-11-27 VITALS — BP 122/52 | HR 62 | Temp 97.8°F | Ht 65.0 in | Wt 151.4 lb

## 2019-11-27 DIAGNOSIS — Z7901 Long term (current) use of anticoagulants: Secondary | ICD-10-CM | POA: Diagnosis not present

## 2019-11-27 DIAGNOSIS — D6859 Other primary thrombophilia: Secondary | ICD-10-CM

## 2019-11-27 DIAGNOSIS — Z86718 Personal history of other venous thrombosis and embolism: Secondary | ICD-10-CM | POA: Diagnosis not present

## 2019-11-27 LAB — COAGUCHEK XS/INR WAIVED
INR: 2.7 — ABNORMAL HIGH (ref 0.9–1.1)
Prothrombin Time: 32.4 s

## 2019-11-27 NOTE — Progress Notes (Signed)
BP (!) 122/52   Pulse 62   Temp 97.8 F (36.6 C) (Temporal)   Ht _0  (1.651 m)   Wt 151 lb 6.4 oz (68.7 kg)   SpO2 99%   BMI 25.19 kg/m    Subjective:   Patient ID: Alan [name]., male    DOB: 05-29-1941, 78 y.o.   MRN: [id number]  HPI: Keni Elison. is a 78 y.o. male presenting on 11/27/2019 for Coagulation Disorder   HPI Coumadin recheck Target goal: 2.0-3.0 Reason on anticoagulation: Paroxysmal A. fib with history of DVT Patient denies any bruising or bleeding or chest pain or palpitations   Relevant past medical, surgical, family and social history reviewed and updated as indicated. Interim medical history since our last visit reviewed. Allergies and medications reviewed and updated.  Review of Systems  Constitutional: Negative for chills and fever.  Respiratory: Negative for shortness of breath and wheezing.   Cardiovascular: Negative for chest pain and leg swelling.  Gastrointestinal: Negative for blood in stool.  Genitourinary: Negative for hematuria.  Musculoskeletal: Negative for back pain and gait problem.  Skin: Negative for rash.  All other systems reviewed and are negative.   Per HPI unless specifically indicated above   Allergies as of 11/27/2019      Reactions   Allopurinol Other (See Comments)   Significantly decreased WBC's   Colchicine Other (See Comments)   Significantly decreased WBC's      Medication List       Accurate as of November 27, 2019  9:11 AM. If you have any questions, ask your nurse or doctor.        amLODipine 10 MG tablet Commonly known as: NORVASC Take 1 tablet (10 mg total) by mouth every evening.   chlorthalidone 25 MG tablet Commonly known as: HYGROTON Take 1 tablet (25 mg total) by mouth daily.   finasteride 5 MG tablet Commonly known as: PROSCAR Take 1 tablet (5 mg total) by mouth daily.   fluticasone 50 MCG/ACT nasal spray Commonly known as: FLONASE Place 1 spray into both nostrils 2 (two) times  daily as needed for allergies or rhinitis.   glucose blood test strip Commonly known as: ONE TOUCH ULTRA TEST Use to check BG once daily.  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 Please make yearly appt with Dr. Tamala Julian for March before anymore refills. 1st attempt   metFORMIN 1000 MG tablet Commonly known as: GLUCOPHAGE TAKE 1 TABLET BY MOUTH TWICE DAILY WITH A MEAL   metoprolol succinate 100 MG 24 hr tablet Commonly known as: TOPROL-XL Take 1 tablet (100 mg total) by mouth daily. Take with or immediately following a meal.   omeprazole 20 MG capsule Commonly known as: PRILOSEC Take 1 capsule (20 mg total) by mouth daily.   ONE TOUCH ULTRA MINI w/Device Kit 1 each by Does not apply route 2 (two) times daily.   pantoprazole 40 MG tablet Commonly known as: PROTONIX Take 1 tablet by mouth 2 (two) times daily.   potassium chloride SA 20 MEQ tablet Commonly known as: KLOR-CON Take 1 tablet (20 mEq total) by mouth daily.   pravastatin 40 MG tablet Commonly known as: PRAVACHOL Take 1 tablet (40 mg total) by mouth every evening.   warfarin 5 MG tablet Commonly known as:  COUMADIN Take as directed by the anticoagulation clinic. If you are unsure how to take this medication, talk to your nurse or doctor. Original instructions: TAKE 1 & 1/2 (ONE & ONE-HALF) TABLETS BY MOUTH ONCE DAILY AT 6 IN THE EVENING        Objective:   BP (!) 122/52   Pulse 62   Temp 97.8 F (36.6 C) (Temporal)   Ht _0  (1.651 m)   Wt 151 lb 6.4 oz (68.7 kg)   SpO2 99%   BMI 25.19 kg/m   Wt Readings from Last 3 Encounters:  11/27/19 151 lb 6.4 oz (68.7 kg)  10/21/19 156 lb 3.2 oz (70.9 kg)  10/15/19 156 lb 6.4 oz (70.9 kg)    Physical Exam Vitals and nursing note reviewed.  Constitutional:      General: He is not in acute  distress.    Appearance: He is well-developed. He is not diaphoretic.  Eyes:     General: No scleral icterus.    Conjunctiva/sclera: Conjunctivae normal.  Neck:     Thyroid: No thyromegaly.  Neurological:     Mental Status: He is alert and oriented to person, place, and time.     Coordination: Coordination normal.  Psychiatric:        Behavior: Behavior normal.     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  Counseling provided for all of the vaccine components Orders Placed This Encounter  Procedures  . inr FINGERSTICK    Caryl Pina, MD [facility] 11/27/2019, 9:11 AM

## 2019-12-22 ENCOUNTER — Other Ambulatory Visit: Payer: Self-pay

## 2019-12-22 DIAGNOSIS — C61 Malignant neoplasm of prostate: Secondary | ICD-10-CM

## 2020-01-15 ENCOUNTER — Ambulatory Visit: Payer: Medicare Other | Attending: Internal Medicine

## 2020-01-15 DIAGNOSIS — Z23 Encounter for immunization: Secondary | ICD-10-CM

## 2020-01-15 NOTE — Progress Notes (Signed)
   [id number]  Name:  Tora Rhatigan.    MRN: [id number] DOB: 12/03/1941  01/15/2020  Mr. Gutzmer was observed post Covid-19 immunization for 15 minutes without incidence. He was provided with Vaccine Information Sheet and instruction to access the V-Safe system.   Mr. Baldassari was instructed to call 911 with any severe reactions post vaccine: Marland Kitchen Difficulty breathing  . Swelling of your face and throat  . A fast heartbeat  . A bad rash all over your body  . Dizziness and weakness    Immunizations Administered    Name Date Dose VIS Date Route   Moderna COVID-19 Vaccine 01/15/2020 12:20 PM 0.5 mL 11/11/2019 Intramuscular   Manufacturer: Moderna   Lot: [id number]   [id number]

## 2020-01-27 ENCOUNTER — Other Ambulatory Visit: Payer: Self-pay

## 2020-01-28 ENCOUNTER — Encounter: Payer: Self-pay | Admitting: Family Medicine

## 2020-01-28 ENCOUNTER — Ambulatory Visit (INDEPENDENT_AMBULATORY_CARE_PROVIDER_SITE_OTHER): Payer: Medicare Other | Admitting: Family Medicine

## 2020-01-28 VITALS — BP 143/56 | HR 53 | Temp 97.8°F | Ht 65.0 in | Wt 158.0 lb

## 2020-01-28 DIAGNOSIS — D6859 Other primary thrombophilia: Secondary | ICD-10-CM

## 2020-01-28 DIAGNOSIS — Z86718 Personal history of other venous thrombosis and embolism: Secondary | ICD-10-CM

## 2020-01-28 DIAGNOSIS — Z7901 Long term (current) use of anticoagulants: Secondary | ICD-10-CM | POA: Diagnosis not present

## 2020-01-28 LAB — COAGUCHEK XS/INR WAIVED
INR: 2.5 — ABNORMAL HIGH (ref 0.9–1.1)
Prothrombin Time: 29.5 s

## 2020-01-28 NOTE — Progress Notes (Signed)
   BP (!) 143/56   Pulse (!) 53   Temp 97.8 F (36.6 C) (Temporal)   Ht 5\' 5"  (1.651 m)   Wt 158 lb (71.7 kg)   SpO2 100%   BMI 26.29 kg/m    Subjective:   Patient ID: [name], male    DOB: 01-17-1941, 79 y.o.   MRN: [id number]  HPI: Alan [name]. is a 79 y.o. male presenting on 01/28/2020 for Coagulation Disorder   HPI Coumadin recheck Target goal: 2.0-3.0 Reason on anticoagulation: History of DVTs and clots on different occasions Patient denies any bruising or bleeding or chest pain or palpitations    Relevant past medical, surgical, family and social history reviewed and updated as indicated. Interim medical history since our last visit reviewed. Allergies and medications reviewed and updated.  Review of Systems  Constitutional: Negative for chills and fever.  Respiratory: Negative for shortness of breath and wheezing.   Cardiovascular: Negative for chest pain and leg swelling.  Gastrointestinal: Negative for anal bleeding and blood in stool.  Genitourinary: Negative for hematuria.  Musculoskeletal: Negative for back pain and gait problem.  Skin: Negative for rash.  All other systems reviewed and are negative.   Per HPI unless specifically indicated above      Objective:   BP (!) 143/56   Pulse (!) 53   Temp 97.8 F (36.6 C) (Temporal)   Ht 5\' 5"  (1.651 m)   Wt 158 lb (71.7 kg)   SpO2 100%   BMI 26.29 kg/m   Wt Readings from Last 3 Encounters:  01/28/20 158 lb (71.7 kg)  11/27/19 151 lb 6.4 oz (68.7 kg)  10/21/19 156 lb 3.2 oz (70.9 kg)    Physical Exam Vitals and nursing note reviewed.  Constitutional:      General: He is not in acute distress.    Appearance: Normal appearance. He is not toxic-appearing or diaphoretic.  Skin:    General: Skin is warm and dry.  Neurological:     Mental Status: He is alert.       Assessment & Plan:   Problem List Items Addressed This Visit      Other   Long term (current) use of anticoagulants  [Z79.01] - Primary   Relevant Orders   CoaguChek XS/INR Waived      Description   continue current dose to take 1 tablet every day of the week except for Mondays, and Saturdays take 1 1/2 tablets  INR was 2.5 (goal 2-3)  Follow-up 6 to 8 weeks     Follow up plan: Return if symptoms worsen or fail to improve, for 6 to 8-week recheck for Coumadin and hypertension and diabetes lab work.  Counseling provided for all of the vaccine components Orders Placed This Encounter  Procedures  . CoaguChek XS/INR [place], MD [facility] 01/28/2020, 11:27 AM

## 2020-02-11 ENCOUNTER — Telehealth: Payer: Self-pay | Admitting: Family Medicine

## 2020-02-11 NOTE — Chronic Care Management (AMB) (Signed)
  Chronic Care Management   Outreach Note  02/11/2020 Name: Alan [name]. MRN: [id number] DOB: [birth year]  Alan [name]. is a 79 y.o. year old male who is a primary care patient of Dettinger, Fransisca Kaufmann, MD. I reached out to [name] by phone today in response to a referral sent by [name]'s health plan.     An unsuccessful telephone outreach was attempted today. The patient was referred to the case management team for assistance with care management and care coordination.   Follow Up Plan: A HIPPA compliant phone message was left for the patient providing contact information and requesting a return call.  The care management team will reach out to the patient again over the next 7 days.  If patient returns call to provider office, please advise to call [facility] at [place], [place], [place], [place] [address] Direct Dial: [phone] [email or web address] Website: [email or web address]

## 2020-02-16 ENCOUNTER — Ambulatory Visit: Payer: Medicare Other | Attending: Internal Medicine

## 2020-02-16 ENCOUNTER — Other Ambulatory Visit: Payer: Self-pay | Admitting: Family Medicine

## 2020-02-16 DIAGNOSIS — Z23 Encounter for immunization: Secondary | ICD-10-CM | POA: Insufficient documentation

## 2020-02-16 NOTE — Progress Notes (Signed)
COVID-19 Vaccine Information can be found at: [email or web address] For questions related to vaccine distribution or appointments, please email [email or web address] or call [phone].    

## 2020-02-17 NOTE — Chronic Care Management (AMB) (Signed)
  Chronic Care Management   Outreach Note  02/17/2020 Name: Alan [name]. MRN: [id number] DOB: 06-Aug-1941  [name]. is a 80 y.o. year old male who is a primary care patient of Dettinger, Fransisca Kaufmann, MD. I reached out to [name] by phone today in response to a referral sent by Mr. Dmarius Kehl Jr.'s health plan.     A second unsuccessful telephone outreach was attempted today. The patient was referred to the case management team for assistance with care management and care coordination.   Follow Up Plan: A HIPPA compliant phone message was left for the patient providing contact information and requesting a return call.  The care management team will reach out to the patient again over the next 7 days.  If patient returns call to provider office, please advise to call [facility]  at [place], [address], [place], [place] [address] Direct Dial: [phone] [email or web address] Website: [email or web address]

## 2020-02-19 NOTE — Chronic Care Management (AMB) (Signed)
  Chronic Care Management   Note  02/19/2020 Name: Arlee Santosuosso. MRN: [id number] DOB: 1941-06-27  Markian Glockner. is a 79 y.o. year old male who is a primary care patient of Dettinger, Fransisca Kaufmann, MD. I reached out to [name] by phone today in response to a referral sent by Mr. Sadrac Zeoli Jr.'s health plan.     Mr. Lesinski wife Arville Go was given information about Chronic Care Management services today including:  1. CCM service includes personalized support from designated clinical staff supervised by his physician, including individualized plan of care and coordination with other care providers 2. 24/7 contact phone numbers for assistance for urgent and routine care needs. 3. Service will only be billed when office clinical staff spend 20 minutes or more in a month to coordinate care. 4. Only one practitioner may furnish and bill the service in a calendar month. 5. The patient may stop CCM services at any time (effective at the end of the month) by phone call to the office staff. 6. The patient will be responsible for cost sharing (co-pay) of up to 20% of the service fee (after annual deductible is met).  Patient's wife Arville Go agreed to services and verbal consent obtained.   Follow up plan: Telephone appointment with care management team member scheduled for: 05/20/2020  Noreene Lar[address], [address], [place], [place] [address] Direct Dial: [phone] [email or web address]: [email or web address]

## 2020-03-29 ENCOUNTER — Ambulatory Visit (INDEPENDENT_AMBULATORY_CARE_PROVIDER_SITE_OTHER): Payer: Medicare Other | Admitting: Family Medicine

## 2020-03-29 ENCOUNTER — Encounter: Payer: Self-pay | Admitting: Family Medicine

## 2020-03-29 ENCOUNTER — Other Ambulatory Visit: Payer: Self-pay

## 2020-03-29 VITALS — BP 160/54 | HR 55 | Temp 97.5°F | Ht 65.0 in | Wt 161.4 lb

## 2020-03-29 DIAGNOSIS — I152 Hypertension secondary to endocrine disorders: Secondary | ICD-10-CM

## 2020-03-29 DIAGNOSIS — E1169 Type 2 diabetes mellitus with other specified complication: Secondary | ICD-10-CM

## 2020-03-29 DIAGNOSIS — I480 Paroxysmal atrial fibrillation: Secondary | ICD-10-CM | POA: Diagnosis not present

## 2020-03-29 DIAGNOSIS — E1159 Type 2 diabetes mellitus with other circulatory complications: Secondary | ICD-10-CM

## 2020-03-29 DIAGNOSIS — E119 Type 2 diabetes mellitus without complications: Secondary | ICD-10-CM | POA: Diagnosis not present

## 2020-03-29 DIAGNOSIS — Z86718 Personal history of other venous thrombosis and embolism: Secondary | ICD-10-CM

## 2020-03-29 DIAGNOSIS — D6859 Other primary thrombophilia: Secondary | ICD-10-CM

## 2020-03-29 DIAGNOSIS — K219 Gastro-esophageal reflux disease without esophagitis: Secondary | ICD-10-CM

## 2020-03-29 DIAGNOSIS — Z7901 Long term (current) use of anticoagulants: Secondary | ICD-10-CM

## 2020-03-29 DIAGNOSIS — I10 Essential (primary) hypertension: Secondary | ICD-10-CM

## 2020-03-29 DIAGNOSIS — E785 Hyperlipidemia, unspecified: Secondary | ICD-10-CM

## 2020-03-29 LAB — COAGUCHEK XS/INR WAIVED
INR: 1.8 — ABNORMAL HIGH (ref 0.9–1.1)
Prothrombin Time: 21.7 s

## 2020-03-29 LAB — BAYER DCA HB A1C WAIVED: HB A1C (BAYER DCA - WAIVED): 6.8 % (ref <7.0)

## 2020-03-29 NOTE — Progress Notes (Signed)
BP (!) 160/54   Pulse (!) 55   Temp (!) 97.5 F (36.4 C) (Temporal)   Ht _0  (1.651 m)   Wt 161 lb 6 oz (73.2 kg)   BMI 26.85 kg/m    Subjective:   Patient ID: Alan Netters., male    DOB: 1941-06-03, 79 y.o.   MRN: [id number]  HPI: Alan Coppa. is a 79 y.o. male presenting on 03/29/2020 for Medical Management of Chronic Issues   HPI Coumadin recheck Target goal: 2.0-3.0 Reason on anticoagulation: A. fib and history of blood clots Patient denies any bruising or bleeding or chest pain or palpitations   Type 2 diabetes mellitus Patient comes in today for recheck of his diabetes. Patient has been currently taking Metformin, A1c is 6.8. Patient is currently on an ACE inhibitor/ARB. Patient has not seen an ophthalmologist this year. Patient denies any issues with their feet.   Hypertension Patient is currently on lisinopril and chlorthalidone and metoprolol, and their blood pressure today is 160/54, he will keep close eye on it at home. Patient denies any lightheadedness or dizziness. Patient denies headaches, blurred vision, chest pains, shortness of breath, or weakness. Denies any side effects from medication and is content with current medication.   Hyperlipidemia Patient is coming in for recheck of his hyperlipidemia. The patient is currently taking pravastatin. They deny any issues with myalgias or history of liver damage from it. They deny any focal numbness or weakness or chest pain.   GERD Patient is currently on omeprazole.  She denies any major symptoms or abdominal pain or belching or burping. She denies any blood in her stool or lightheadedness or dizziness.   Relevant past medical, surgical, family and social history reviewed and updated as indicated. Interim medical history since our last visit reviewed. Allergies and medications reviewed and updated.  Review of Systems  Constitutional: Negative for chills and fever.  Respiratory: Negative for shortness of breath  and wheezing.   Cardiovascular: Negative for chest pain and leg swelling.  Musculoskeletal: Negative for back pain and gait problem.  Skin: Negative for rash.  Neurological: Negative for dizziness, weakness and light-headedness.  All other systems reviewed and are negative.   Per HPI unless specifically indicated above   Allergies as of 03/29/2020      Reactions   Allopurinol Other (See Comments)   Significantly decreased WBC's   Colchicine Other (See Comments)   Significantly decreased WBC's      Medication List       Accurate as of March 29, 2020 11:07 AM. If you have any questions, ask your nurse or doctor.        amLODipine 10 MG tablet Commonly known as: NORVASC Take 1 tablet (10 mg total) by mouth every evening.   chlorthalidone 25 MG tablet Commonly known as: HYGROTON Take 1 tablet (25 mg total) by mouth daily.   finasteride 5 MG tablet Commonly known as: PROSCAR Take 1 tablet (5 mg total) by mouth daily.   fluticasone 50 MCG/ACT nasal spray Commonly known as: FLONASE Place 1 spray into both nostrils 2 (two) times daily as needed for allergies or rhinitis.   glucose blood test strip Commonly known as: ONE TOUCH ULTRA TEST Use to check BG once daily.  Dx:  Type 2 DM controlled E11.9   Iron 325 (65 Fe) MG Tabs Take 1 tablet by mouth once daily with breakfast   lisinopril 20 MG tablet Commonly known as: ZESTRIL Take 1.5 tablets (30 mg total)  by mouth daily. Please make yearly appt with Dr. Tamala Julian for March before anymore refills. 1st attempt   metFORMIN 1000 MG tablet Commonly known as: GLUCOPHAGE TAKE 1 TABLET BY MOUTH TWICE DAILY WITH A MEAL   metoprolol succinate 100 MG 24 hr tablet Commonly known as: TOPROL-XL Take 1 tablet (100 mg total) by mouth daily. Take with or immediately following a meal.   omeprazole 20 MG capsule Commonly known as: PRILOSEC Take 1 capsule (20 mg total) by mouth daily.   ONE TOUCH ULTRA MINI w/Device Kit 1 each by Does  not apply route 2 (two) times daily.   potassium chloride SA 20 MEQ tablet Commonly known as: KLOR-CON Take 1 tablet (20 mEq total) by mouth daily.   pravastatin 40 MG tablet Commonly known as: PRAVACHOL Take 1 tablet (40 mg total) by mouth every evening.   warfarin 5 MG tablet Commonly known as: COUMADIN Take as directed by the anticoagulation clinic. If you are unsure how to take this medication, talk to your nurse or doctor. Original instructions: TAKE 1 & 1/2 (ONE & ONE-HALF) TABLETS BY MOUTH ONCE DAILY AT 6 IN THE EVENING        Objective:   BP (!) 160/54   Pulse (!) 55   Temp (!) 97.5 F (36.4 C) (Temporal)   Ht _0  (1.651 m)   Wt 161 lb 6 oz (73.2 kg)   BMI 26.85 kg/m   Wt Readings from Last 3 Encounters:  03/29/20 161 lb 6 oz (73.2 kg)  01/28/20 158 lb (71.7 kg)  11/27/19 151 lb 6.4 oz (68.7 kg)    Physical Exam Vitals and nursing note reviewed.  Constitutional:      General: He is not in acute distress.    Appearance: He is well-developed. He is not diaphoretic.  Eyes:     General: No scleral icterus.    Conjunctiva/sclera: Conjunctivae normal.  Neck:     Thyroid: No thyromegaly.  Cardiovascular:     Rate and Rhythm: Normal rate and regular rhythm.     Heart sounds: Normal heart sounds. No murmur.  Pulmonary:     Effort: Pulmonary effort is normal. No respiratory distress.     Breath sounds: Normal breath sounds. No wheezing.  Musculoskeletal:        General: Normal range of motion.     Cervical back: Neck supple.  Lymphadenopathy:     Cervical: No cervical adenopathy.  Skin:    General: Skin is warm and dry.     Findings: No rash.  Neurological:     Mental Status: He is alert and oriented to person, place, and time.     Coordination: Coordination normal.  Psychiatric:        Behavior: Behavior normal.     Description   continue current dose to take 1 tablet every day of the week except for Mondays, and Saturdays take 1 1/2 tablets  INR  was 1.8 (goal 2-3) slightly thick, no adjustment because has been good for a while, will keep same dose, he is already due to take 1-1/2 tablets today. Follow-up 4-6 weeks      Assessment & Plan:   Problem List Items Addressed This Visit      Cardiovascular and Mediastinum   Hypertension associated with diabetes ([facility])   Paroxysmal atrial fibrillation ([facility])     Digestive   GERD (gastroesophageal reflux disease)     Endocrine   Hyperlipidemia associated with type 2 diabetes mellitus ([facility])   Type  2 diabetes mellitus without complications ([facility])     Hematopoietic and Hemostatic   Primary hypercoagulable state ([facility]) [D68.59]     Other   Long term (current) use of anticoagulants [Z79.01] - Primary   Relevant Orders   CoaguChek XS/INR Waived   History of DVT (deep vein thrombosis)    Other Visit Diagnoses    Type 2 diabetes mellitus with other specified complication, without long-term current use of insulin ([facility])       Relevant Orders   Bayer DCA Hb A1c Waived      Description   continue current dose to take 1 tablet every day of the week except for Mondays, and Saturdays take 1 1/2 tablets  INR was 1.8 (goal 2-3) slightly thick, no adjustment because has been good for a while, will keep same dose, he is already due to take 1-1/2 tablets today. Follow-up 4-6 weeks    No change in blood pressure medication for now, will monitor at home closely. A1c 6.8, no change in medication for that either currently. We will keep Coumadin dose the same just because he naturally has been running getting easily 0.2 below, will recheck in 4 to 6 weeks.  Follow up plan: Return if symptoms worsen or fail to improve, for Recheck in 4 to 6 weeks INR.  Counseling provided for all of the vaccine components Orders Placed This Encounter  Procedures  . Bayer DCA Hb A1c Waived  . CoaguChek XS/INR [facility], MD [facility] 03/29/2020, 11:07 AM

## 2020-04-09 ENCOUNTER — Ambulatory Visit (INDEPENDENT_AMBULATORY_CARE_PROVIDER_SITE_OTHER): Payer: Medicare Other | Admitting: *Deleted

## 2020-04-09 DIAGNOSIS — Z0000 Encounter for general adult medical examination without abnormal findings: Secondary | ICD-10-CM

## 2020-04-09 NOTE — Progress Notes (Signed)
MEDICARE ANNUAL WELLNESS VISIT  04/09/2020  Telephone Visit Disclaimer This Medicare AWV was conducted by telephone due to national recommendations for restrictions regarding the COVID-19 Pandemic (e.g. social distancing).  I verified, using two identifiers, that I am speaking with [name] or their authorized healthcare agent. I discussed the limitations, risks, security, and privacy concerns of performing an evaluation and management service by telephone and the potential availability of an in-person appointment in the future. The patient expressed understanding and agreed to proceed.   Subjective:  Kariem Wolfson. is a 79 y.o. male patient of Dettinger, Fransisca Kaufmann, MD who had a Medicare Annual Wellness Visit today via telephone. [place] is Retired and lives with their spouse. he has 3 living children. he reports that he is socially active and does interact with friends/family regularly. he is minimally physically active and enjoys gardening.  Patient Care Team: Dettinger, Fransisca Kaufmann, MD as PCP - General (Family Medicine) Belva Crome, MD as PCP - Cardiology (Cardiology) Gala Romney Cristopher Estimable, MD as Consulting Physician (Gastroenterology) Irine Seal, MD as Attending Physician (Urology) Dickie La, RN (Inactive) as [facility] Ilean China, RN as Registered Nurse  Advanced Directives 03/04/2018 01/03/2017 04/12/2016 04/09/2016 04/09/2016 11/26/2015 11/22/2015  Does Patient Have a Medical Advance Directive? _0  No No  Would patient like information on creating a medical advance directive? Yes (ED - Information included in AVS) Yes (MAU/Ambulatory/Procedural Areas - Information given) No - patient declined information No - patient declined information - No - patient declined information Yes - Educational materials given    Hospital Utilization Over the Past 12 Months: # of hospitalizations or ER visits: 1 # of surgeries: 0  Review of Systems      Patient reports that his overall health is better compared to last year.  History obtained from chart review and patient  Patient Reported Readings (BP, Pulse, CBG, Weight, etc) none  Pain Assessment Pain : No/denies pain     Current Medications & Allergies (verified) Allergies as of 04/09/2020      Reactions   Allopurinol Other (See Comments)   Significantly decreased WBC's   Colchicine Other (See Comments)   Significantly decreased WBC's      Medication List       Accurate as of April 09, 2020  2:52 PM. If you have any questions, ask your nurse or doctor.        amLODipine 10 MG tablet Commonly known as: NORVASC Take 1 tablet (10 mg total) by mouth every evening.   chlorthalidone 25 MG tablet Commonly known as: HYGROTON Take 1 tablet (25 mg total) by mouth daily.   finasteride 5 MG tablet Commonly known as: PROSCAR Take 1 tablet (5 mg total) by mouth daily.   fluticasone 50 MCG/ACT nasal spray Commonly known as: FLONASE Place 1 spray into both nostrils 2 (two) times daily as needed for allergies or rhinitis.   glucose blood test strip Commonly known as: ONE TOUCH ULTRA TEST Use to check BG once daily.  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 Please make yearly appt with Dr. Tamala Julian for March before anymore refills. 1st attempt   metFORMIN 1000 MG tablet Commonly known as: GLUCOPHAGE TAKE 1 TABLET BY MOUTH TWICE  DAILY WITH A MEAL   metoprolol succinate 100 MG 24 hr tablet Commonly known as: TOPROL-XL Take 1 tablet (100 mg total) by mouth daily. Take with or immediately following a meal.   omeprazole 20 MG capsule Commonly known as: PRILOSEC Take 1 capsule (20 mg total) by mouth daily.   ONE TOUCH ULTRA MINI w/Device Kit 1 each by Does not apply route 2 (two) times daily.   potassium  chloride SA 20 MEQ tablet Commonly known as: KLOR-CON Take 1 tablet (20 mEq total) by mouth daily.   pravastatin 40 MG tablet Commonly known as: PRAVACHOL Take 1 tablet (40 mg total) by mouth every evening.   warfarin 5 MG tablet Commonly known as: COUMADIN Take as directed by the anticoagulation clinic. If you are unsure how to take this medication, talk to your nurse or doctor. Original instructions: TAKE 1 & 1/2 (ONE & ONE-HALF) TABLETS BY MOUTH ONCE DAILY AT 6 IN THE EVENING       History (reviewed): Past Medical History:  Diagnosis Date  . Adenomatous colon polyp   . Agranulocytosis ([place])   . AKI (acute kidney injury) ([place]) 09/22/2019  . Allergy   . Atrial fibrillation ([place]) 04/19/2016  . Cataract    small  . Diabetes mellitus   . Diverticulosis   . ED (erectile dysfunction)   . 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 Gallstones   . GERD (gastroesophageal reflux disease)   . Gout   . Heart murmur   . Hyperlipidemia   . Hypertension   . Internal hemorrhoids   . Pancreatitis   . 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  . Prostate cancer ([place])    Followed by Dr. Jeffie Pollock    Past Surgical History:  Procedure Laterality Date  . CHOLECYSTECTOMY N/A 11/26/2015   Procedure: LAPAROSCOPIC CHOLECYSTECTOMY;  Surgeon: Aviva Signs, MD;  Location: [facility];  Service: General;  Laterality: N/A;  . COLONOSCOPY  2012   [name]: moderate sigmoid diverticulosis, internal hemorrhoids. next tcs 2017  . FLEXIBLE BRONCHOSCOPY Bilateral 04/14/2016   Procedure: FLEXIBLE BRONCHOSCOPY;  Surgeon: Sinda Du, MD;  Location: [facility];  Service: Cardiopulmonary;  Laterality: Bilateral;  . SIGMOIDOSCOPY    . UMBILICAL HERNIA REPAIR N/A 11/26/2015   Procedure: UMBILICAL HERNIORRHAPHY;  Surgeon: Aviva Signs, MD;  Location: [facility];  Service: General;  Laterality: N/A;   Family History  Problem Relation Age of Onset  . Stroke Mother   . Hypertension  Mother   . Alzheimer's disease Mother   . Hypertension Father   . Brain cancer Sister   . Breast cancer Sister   . Hypertension Sister   . Deep vein thrombosis Brother   . Hypertension Brother   . Colon cancer Neg Hx   . Pancreatic disease Neg Hx    Social History   Socioeconomic History  . Marital status: Married    Spouse name: Arville Go  . Number of children: 4  . Years of education: Not on file  . Highest education level: 10th grade  Occupational History  . Occupation: Retired     Fish farm manager: [employer]  Tobacco Use  . Smoking status: Former Smoker    Types: Cigarettes    Quit date: 12/11/1986    Years since quitting: 33.3  . Smokeless tobacco: Never Used  . Tobacco comment: Quit x 50 years  Substance and Sexual Activity  . Alcohol use: No  . Drug use: No  . Sexual activity: Yes  Other Topics  Concern  . Not on file  Social History Narrative  . Not on file   Social Determinants of Health   Financial Resource Strain:   . Difficulty of Paying Living Expenses:   Food Insecurity:   . Worried About Charity fundraiser in the Last Year:   . Arboriculturist in the Last Year:   Transportation Needs:   . Film/video editor (Medical):   Marland Kitchen Lack of Transportation (Non-Medical):   Physical Activity:   . Days of Exercise per Week:   . Minutes of Exercise per Session:   Stress:   . Feeling of Stress :   Social Connections:   . Frequency of Communication with Friends and Family:   . Frequency of Social Gatherings with Friends and Family:   . Attends Religious Services:   . Active Member of Clubs or Organizations:   . Attends Archivist Meetings:   Marland Kitchen Marital Status:     Activities of Daily Living In your present state of health, do you have any difficulty performing the following activities: 04/09/2020  Hearing? Y  Comment slight problem  Vision? N  Difficulty concentrating or making decisions? N  Walking or climbing stairs? N  Dressing or bathing? N  Doing  errands, shopping? N  Preparing Food and eating ? N  Using the Toilet? N  In the past six months, have you accidently leaked urine? N  Do you have problems with loss of bowel control? N  Managing your Medications? N  Managing your Finances? N  Housekeeping or managing your Housekeeping? N  Some recent data might be hidden    Patient Education/ Literacy How often do you need to have someone help you when you read instructions, pamphlets, or other written materials from your doctor or pharmacy?: 1 - Never What is the last grade level you completed in school?: 11  Exercise Current Exercise Habits: The patient does not participate in regular exercise at present, Exercise limited by: None identified  Diet Patient reports consuming 2 meals a day and 1 snack(s) a day Patient reports that his primary diet is: Diabetic Patient reports that she does have regular access to food.   Depression Screen PHQ 2/9 Scores 04/09/2020 03/29/2020 01/28/2020 11/27/2019 10/21/2019 09/22/2019 09/03/2019  PHQ - 2 Score 0 0 0 0 0 0 0     Fall Risk Fall Risk  04/09/2020 03/29/2020 01/28/2020 11/27/2019 10/21/2019  Falls in the past year? 0 0 0 0 0  Number falls in past yr: - - - - -  Injury with Fall? - - - - -  Comment - - - - -  Risk for fall due to : - - - - -  Risk for fall due to: Comment - - - - -     Objective:  [name] seemed alert and oriented and he participated appropriately during our telephone visit.  Blood Pressure Weight BMI  BP Readings from Last 3 Encounters:  03/29/20 (!) 160/54  01/28/20 (!) 143/56  11/27/19 (!) 122/52   Wt Readings from Last 3 Encounters:  03/29/20 161 lb 6 oz (73.2 kg)  01/28/20 158 lb (71.7 kg)  11/27/19 151 lb 6.4 oz (68.7 kg)   BMI Readings from Last 1 Encounters:  03/29/20 26.85 kg/m    *Unable to obtain current vital signs, weight, and BMI due to telephone visit type  Hearing/Vision  . Eero did not seem to have difficulty with  hearing/understanding during the telephone conversation .  Reports that he has not had a formal eye exam by an eye care professional within the past year . Reports that he has not had a formal hearing evaluation within the past year *Unable to fully assess hearing and vision during telephone visit type  Cognitive Function: 6CIT Screen 04/09/2020 04/09/2019  What Year? 0 points 0 points  What month? 3 points 0 points  What time? 0 points 0 points  Count back from 20 0 points 0 points  Months in reverse 2 points 0 points  Repeat phrase 0 points 0 points  Total Score 5 0   (Normal:0-7, Significant for Dysfunction: >8)  Normal Cognitive Function Screening: Yes   Immunization & Health Maintenance Record Immunization History  Administered Date(s) Administered  . Fluad Quad(high Dose 65+) 09/03/2019  . Influenza, High Dose Seasonal PF 09/18/2018  . Influenza,inj,Quad PF,6+ Mos 10/02/2013, 11/12/2014, 09/08/2015, 08/31/2016, 09/11/2017  . Moderna SARS-COVID-2 Vaccination 01/15/2020, 02/16/2020  . Pneumococcal Conjugate-13 03/08/2015  . Pneumococcal Polysaccharide-23 01/10/2013, 09/01/2019  . Tdap 01/10/2013    Health Maintenance  Topic Date Due  . COLONOSCOPY  08/23/2016  . OPHTHALMOLOGY EXAM  03/07/2019  . FOOT EXAM  05/28/2020  . INFLUENZA VACCINE  07/11/2020  . HEMOGLOBIN A1C  09/28/2020  . TETANUS/TDAP  01/10/2023  . COVID-19 Vaccine  Completed  . PNA vac Low Risk Adult  Completed       Assessment  This is a routine wellness examination for Masoud Nyce.Marland Kitchen  Health Maintenance: Due or Overdue Health Maintenance Due  Topic Date Due  . COLONOSCOPY  08/23/2016  . OPHTHALMOLOGY EXAM  03/07/2019    [name] does not need a referral for Community Assistance: Care Management:   no Social Work:    no Prescription Assistance:  no Nutrition/Diabetes Education:  no   Plan:  Personalized Goals Goals Addressed            This Visit's Progress   . Exercise 3x per  week (30 min per time)   Not on track   . Increase physical activity (pt-stated)       Now that it is warmer weather, he will try to walk every evening      Personalized Health Maintenance & Screening Recommendations  Colorectal cancer screening Diabetic eye exam  Lung Cancer Screening Recommended: no (Low Dose CT Chest recommended if Age 16-80 years, 30 pack-year currently smoking OR have quit w/in past 15 years) Hepatitis C Screening recommended: no HIV Screening recommended: no  Advanced Directives: Written information was not prepared per patient's request.  Referrals & Orders No orders of the defined types were placed in this encounter.   Follow-up Plan . Follow-up with Dettinger, Fransisca Kaufmann, MD as planned on 04/30/20 . Schedule Diabetic eye exam, pt is aware and will make appt asap . Needs colon screening . Pt declined information on advanced directives. . Pt intends to start walking outside, now that the weather is warmer. Marland Kitchen He voices no health related concerns. . AVS printed and mailed to pt. .    I have personally reviewed and noted the following in the patient's chart:   . Medical and social history . Use of alcohol, tobacco or illicit drugs  . Current medications and supplements . Functional ability and status . Nutritional status . Physical activity . Advanced directives . List of other physicians . Hospitalizations, surgeries, and ER visits in previous 12 months . Vitals . Screenings to include cognitive, depression, and falls . Referrals and appointments  In  addition, I have reviewed and discussed with [name] certain preventive protocols, quality metrics, and best practice recommendations. A written personalized care plan for preventive services as well as general preventive health recommendations is available and can be mailed to the patient at his request.      Faylene Million Charmaine,LPN  [id number]

## 2020-04-14 ENCOUNTER — Telehealth: Payer: Self-pay | Admitting: *Deleted

## 2020-04-14 NOTE — Telephone Encounter (Signed)
[facility] [place] [facility]/ [facility] Saw patient did quantiflo L 0.82 R 0.63 Pt has not sxs is on an anticoagulant

## 2020-04-21 NOTE — Telephone Encounter (Signed)
Thanks for the information, will documented on his chart next visit

## 2020-04-30 ENCOUNTER — Ambulatory Visit (INDEPENDENT_AMBULATORY_CARE_PROVIDER_SITE_OTHER): Payer: Medicare Other | Admitting: Family Medicine

## 2020-04-30 ENCOUNTER — Encounter: Payer: Self-pay | Admitting: Family Medicine

## 2020-04-30 ENCOUNTER — Other Ambulatory Visit: Payer: Self-pay

## 2020-04-30 VITALS — BP 138/49 | HR 53 | Temp 97.8°F | Ht 65.0 in | Wt 165.0 lb

## 2020-04-30 DIAGNOSIS — Z7901 Long term (current) use of anticoagulants: Secondary | ICD-10-CM | POA: Diagnosis not present

## 2020-04-30 DIAGNOSIS — D6859 Other primary thrombophilia: Secondary | ICD-10-CM

## 2020-04-30 DIAGNOSIS — Z86718 Personal history of other venous thrombosis and embolism: Secondary | ICD-10-CM | POA: Diagnosis not present

## 2020-04-30 LAB — COAGUCHEK XS/INR WAIVED
INR: 2.4 — ABNORMAL HIGH (ref 0.9–1.1)
Prothrombin Time: 29.3 s

## 2020-04-30 NOTE — Progress Notes (Signed)
   BP (!) 138/49   Pulse (!) 53   Temp 97.8 F (36.6 C) (Temporal)   Ht 5\' 5"  (1.651 m)   Wt 165 lb (74.8 kg)   BMI 27.46 kg/m    Subjective:   Patient ID: Alan [name]., male    DOB: [birth year], 79 y.o.   MRN: [id number]  HPI: Alan [name]. is a 79 y.o. male presenting on 04/30/2020 for Medical Management of Chronic Issues (pt here today for [facility])   HPI Coumadin recheck Target goal: 2.0-3.0 Reason on anticoagulation: Paroxysmal A. fib and history of DVT Patient denies any bruising or bleeding or chest pain or palpitations   Relevant past medical, surgical, family and social history reviewed and updated as indicated. Interim medical history since our last visit reviewed. Allergies and medications reviewed and updated.  Review of Systems  Constitutional: Negative for chills and fever.  Respiratory: Negative for shortness of breath and wheezing.   Cardiovascular: Negative for chest pain and leg swelling.  Musculoskeletal: Negative for back pain and gait problem.  Skin: Negative for rash.  All other systems reviewed and are negative.   Per HPI unless specifically indicated above      Objective:   BP (!) 138/49   Pulse (!) 53   Temp 97.8 F (36.6 C) (Temporal)   Ht 5\' 5"  (1.651 m)   Wt 165 lb (74.8 kg)   BMI 27.46 kg/m   Wt Readings from Last 3 Encounters:  04/30/20 165 lb (74.8 kg)  03/29/20 161 lb 6 oz (73.2 kg)  01/28/20 158 lb (71.7 kg)    Physical Exam Vitals and nursing note reviewed.  Constitutional:      General: He is not in acute distress.    Appearance: He is well-developed. He is not diaphoretic.  Eyes:     General: No scleral icterus.    Conjunctiva/sclera: Conjunctivae normal.  Neck:     Thyroid: No thyromegaly.  Cardiovascular:     Rate and Rhythm: Normal rate. Rhythm irregular.     Heart sounds: Normal heart sounds. No murmur.  Pulmonary:     Effort: Pulmonary effort is normal. No respiratory distress.     Breath sounds: Normal  breath sounds. No wheezing.  Neurological:     Mental Status: He is alert and oriented to person, place, and time.     Coordination: Coordination normal.     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  Counseling provided for all of the vaccine components Orders Placed This Encounter  Procedures  . CoaguChek XS/INR Pillager, MD [facility] 04/30/2020, 11:26 AM

## 2020-05-20 ENCOUNTER — Ambulatory Visit: Payer: Medicare Other | Admitting: *Deleted

## 2020-05-20 ENCOUNTER — Other Ambulatory Visit: Payer: Self-pay | Admitting: Urology

## 2020-05-20 DIAGNOSIS — E1159 Type 2 diabetes mellitus with other circulatory complications: Secondary | ICD-10-CM

## 2020-05-20 DIAGNOSIS — E119 Type 2 diabetes mellitus without complications: Secondary | ICD-10-CM

## 2020-05-20 NOTE — Chronic Care Management (AMB) (Signed)
  Chronic Care Management   Initial Visit Outreach  05/20/2020 Name: Alan [name]. MRN: [id number] DOB: 09-22-1941  Referred by: Dettinger, Fransisca Kaufmann, MD Reason for referral : Chronic Care Management (Initial Visit)   An unsuccessful Initial Telephone visit was attempted today. The patient was referred to the case management team for assistance with care management and care coordination.   Goals Addressed            This Visit's Progress   . Chronic Disease Management Needs       CARE PLAN ENTRY (see longtitudinal plan of care for additional care plan information)  Current Barriers:  . Chronic Disease Management support, education, and care coordination needs related to HTN, Afib, DM, HLD  Clinical Goals: . Over the next 10 days, patient will be contacted by a Care Guide to reschedule their Initial CCM Visit . Over the next 30 days, patient will have an Initial CCM Visit with a member of the embedded CCM team to discuss self-management of their chronic medical conditions  Interventions: . Chart reviewed in preparation for initial visit telephone call . Collaboration with other care team members as needed . Unsuccessful outreach to patient  . A HIPPA compliant phone message was left for the patient providing contact information and requesting a return call.  . Request sent to care guides to reach out and reschedule patient's initial visit  Patient Self Care Activities: . Undetermined   Initial goal documentation        Follow Up Plan: A HIPPA compliant phone message was left for the patient providing contact information and requesting a return call.  The care management team will reach out to the patient again over the next 10 days.   Chong Sicilian, BSN, RN-BC Embedded Chronic Care Manager [facility] Direct Dial: [phone]

## 2020-05-21 ENCOUNTER — Ambulatory Visit: Payer: Medicare Other | Admitting: Urology

## 2020-05-21 ENCOUNTER — Telehealth: Payer: Self-pay | Admitting: Family Medicine

## 2020-05-21 LAB — PSA: PSA: 6.4 ng/mL — ABNORMAL HIGH (ref <=4.0)

## 2020-05-21 NOTE — Chronic Care Management (AMB) (Signed)
  Care Management   Note  05/21/2020 Name: Alan [name]. MRN: [id number] DOB: Jul 01, 1941  Alan [name]. is a 79 y.o. year old male who is a primary care patient of Dettinger, Fransisca Kaufmann, MD and is actively engaged with the care management team. I reached out to [name] by phone today to assist with re-scheduling an initial visit with the RN Case Manager.  Follow up plan: Unsuccessful telephone outreach attempt made. A HIPPA compliant phone message was left for the patient providing contact information and requesting a return call. The care management team will reach out to the patient again over the next 7 days. If patient returns call to provider office, please advise to call [facility] at [phone].  [place], [place] [address] Direct Dial: [phone] [email or web address] Website: [email or web address]

## 2020-05-24 NOTE — Chronic Care Management (AMB) (Signed)
  Chronic Care Management   Note  05/24/2020 Name: Shaquel Chavous. MRN: [id number] DOB: 12/10/1941  Andrea Colglazier. is a 79 y.o. year old male who is a primary care patient of Dettinger, Fransisca Kaufmann, MD and is actively engaged with the care management team. I reached out to [name] by phone today to assist with re-scheduling an initial visit with the Licensed Clinical Social Worker for intake appointment with RN CM.  Follow up plan: Telephone appointment with care management team member scheduled for: 06/22/2020.  [place], [place] [address] Direct Dial: [phone] [email or web address]: [email or web address]

## 2020-05-25 ENCOUNTER — Other Ambulatory Visit: Payer: Self-pay | Admitting: Family Medicine

## 2020-05-25 DIAGNOSIS — D6859 Other primary thrombophilia: Secondary | ICD-10-CM

## 2020-05-27 NOTE — Progress Notes (Signed)
Subjective:  1. Prostate cancer ([place])   2. Elevated PSA   3. Nodular prostate with urinary obstruction   4. Nocturia      Mr. 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 He had only 5% in one core and elected active survelliance. He had a repeat biopsy on 12/24/13 that showed a single core with <5% Gleason 6 in the right mid lateral prostate. His last biopsy was on 06/28/18 and only showed 2 cores with atypia. He is on finasteride and has a slowly rising PSA that is up to 6.4 prior to this visit from 5.3 at his last visit.   He had a repeat MRI ordered at his last visit but he doesn't appear to have had an MRIP done since 2019.  The nadir on finasteride was 1.8.  His IPSS is 10 but he is satisfied with his voiding symptoms.   His PSA was 6.2 prior to therapy starting finasteride.      IPSS    Row Name 05/28/20 0900         International Prostate Symptom Score   How often have you had the sensation of not emptying your bladder? Less than 1 in 5     How often have you had to urinate less than every two hours? Less than 1 in 5 times     How often have you found you stopped and started again several times when you urinated? Less than half the time     How often have you found it difficult to postpone urination? Less than half the time     How often have you had a weak urinary stream? Less than half the time     How often have you had to strain to start urination? Not at All     How many times did you typically get up at night to urinate? 2 Times     Total IPSS Score 10       Quality of Life due to urinary symptoms   If you were to spend the rest of your life with your urinary condition just the way it is now how would you feel about that? Mostly Satisfied             ROS:  ROS:  A complete review of systems was performed.  All systems are negative except for pertinent findings as noted.    ROS  Allergies  Allergen Reactions  . Allopurinol Other (See Comments)    Significantly decreased WBC's  . Colchicine Other (See Comments)    Significantly decreased WBC's    Outpatient Encounter Medications as of 05/28/2020  Medication Sig  . amLODipine (NORVASC) 10 MG tablet Take 1 tablet (10 mg total) by mouth every evening.  . Blood Glucose Monitoring Suppl (ONE TOUCH ULTRA MINI) w/Device KIT 1 each by Does not apply route 2 (two) times daily.  . chlorthalidone (HYGROTON) 25 MG tablet Take 1 tablet (25 mg total) by mouth daily.  . Ferrous Sulfate (IRON) 325 (65 Fe) MG TABS Take 1 tablet by mouth once daily with breakfast  . finasteride (PROSCAR) 5 MG tablet Take 1 tablet (5 mg total) by mouth daily.  . fluticasone (FLONASE) 50 MCG/ACT nasal spray Place 1 spray into both nostrils 2 (two) times daily as needed for allergies or rhinitis.  Marland Kitchen glucose blood (ONE TOUCH ULTRA TEST) test strip Use to check BG once daily.  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 lisinopril (ZESTRIL) 20 MG tablet Take 1.5 tablets (30 mg total) by mouth daily. Please make yearly appt with Dr. Tamala Julian for March before anymore refills. 1st attempt  . metFORMIN (GLUCOPHAGE) 1000 MG tablet TAKE 1 TABLET BY MOUTH TWICE DAILY WITH A MEAL  . omeprazole (PRILOSEC) 20 MG capsule Take 1 capsule (20 mg total) by mouth daily.  . potassium chloride SA (K-DUR) 20 MEQ tablet Take 1 tablet (20 mEq total) by mouth daily.  . pravastatin (PRAVACHOL) 40 MG tablet Take 1 tablet (40 mg total) by mouth every evening.  . warfarin (COUMADIN) 5 MG tablet TAKE 1.5 (ONE & ONE-HALF) TABLETS BY MOUTH ONCE DAILY AT 6 IN THE EVENING  . levofloxacin (LEVAQUIN) 750 MG tablet Take 1 tablet (750 mg total) by mouth daily. Take 1 hour prior to prostate biopsy.  . metoprolol succinate (TOPROL-XL) 100 MG 24 hr tablet Take 1 tablet (100 mg total) by mouth daily. Take with or immediately following a meal.   No facility-administered encounter medications on  file as of 05/28/2020.    Past Medical History:  Diagnosis Date  . Adenomatous colon polyp   . Agranulocytosis ([place])   . AKI (acute kidney injury) ([place]) 09/22/2019  . Allergy   . Atrial fibrillation ([place]) 04/19/2016  . Cataract    small  . Diabetes mellitus   . Diverticulosis   . ED (erectile dysfunction)   . 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 Gallstones   . GERD (gastroesophageal reflux disease)   . Gout   . Heart murmur   . Hyperlipidemia   . Hypertension   . Internal hemorrhoids   . Pancreatitis   . 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  . Prostate cancer ([place])    Followed by Dr. Jeffie Pollock     Past Surgical History:  Procedure Laterality Date  . CHOLECYSTECTOMY N/A 11/26/2015   Procedure: LAPAROSCOPIC CHOLECYSTECTOMY;  Surgeon: Aviva Signs, MD;  Location: [facility];  Service: General;  Laterality: N/A;  . COLONOSCOPY  2012   [name]: moderate sigmoid diverticulosis, internal hemorrhoids. next tcs 2017  . FLEXIBLE BRONCHOSCOPY Bilateral 04/14/2016   Procedure: FLEXIBLE BRONCHOSCOPY;  Surgeon: Sinda Du, MD;  Location: [facility];  Service: Cardiopulmonary;  Laterality: Bilateral;  . SIGMOIDOSCOPY    . UMBILICAL HERNIA REPAIR N/A 11/26/2015   Procedure: UMBILICAL HERNIORRHAPHY;  Surgeon: Aviva Signs, MD;  Location: [facility];  Service: General;  Laterality: N/A;    Social History   Socioeconomic History  . Marital status: Married    Spouse name: Arville Go  . Number of children: 4  . Years of education: Not on file  . Highest education level: 10th grade  Occupational History  . Occupation: Retired     Fish farm manager: [employer]  Tobacco Use  . Smoking status: Former Smoker    Types: Cigarettes    Quit date: 12/11/1986    Years since quitting: 33.4  . Smokeless tobacco: Never Used  . Tobacco comment: Quit x 50 years  Vaping Use  . Vaping Use: Never used  Substance and Sexual Activity  . Alcohol use: No  . Drug use: No  .  Sexual activity: Yes  Other Topics Concern  . Not on file  Social History Narrative  . Not on file   Social Determinants of Health   Financial Resource Strain:   . Difficulty of Paying Living Expenses:   Food Insecurity:   . Worried About Running  Out of Food in the Last Year:   . [place] in the Last Year:   Transportation Needs:   . Lack of Transportation (Medical):   Marland Kitchen Lack of Transportation (Non-Medical):   Physical Activity:   . Days of Exercise per Week:   . Minutes of Exercise per Session:   Stress:   . Feeling of Stress :   Social Connections:   . Frequency of Communication with Friends and Family:   . Frequency of Social Gatherings with Friends and Family:   . Attends Religious Services:   . Active Member of Clubs or Organizations:   . Attends Archivist Meetings:   Marland Kitchen Marital Status:   Intimate Partner Violence:   . Fear of Current or Ex-Partner:   . Emotionally Abused:   Marland Kitchen Physically Abused:   . Sexually Abused:     Family History  Problem Relation Age of Onset  . Stroke Mother   . Hypertension Mother   . Alzheimer's disease Mother   . Hypertension Father   . Brain cancer Sister   . Breast cancer Sister   . Hypertension Sister   . Deep vein thrombosis Brother   . Hypertension Brother   . Colon cancer Neg Hx   . Pancreatic disease Neg Hx        Objective: Vitals:   05/28/20 0928  BP: (!) 153/53  Pulse: (!) 58  Temp: 98.6 F (37 C)     Physical Exam Vitals reviewed.  Constitutional:      Appearance: Normal appearance.  Genitourinary:    Comments: AP without lesions. NST without mass. Prostate 1.5+ with right > left with some induration.  SV non-palpable.  Neurological:     Mental Status: He is alert.     Lab Results:  No results found for this or any previous visit (from the past 24 hour(s)).  BMET No results for input(s): NA, K, CL, CO2, GLUCOSE, BUN, CREATININE, CALCIUM in the last 72 hours. PSA PSA  Date  Value Ref Range Status  05/20/2020 6.4 (H) < OR = 4.0 ng/mL Final    Comment:    The total PSA value from this assay system is  standardized against the WHO standard. The test  result will be approximately 20% lower when compared  to the equimolar-standardized total PSA (Beckman  Coulter). Comparison of serial PSA results should be  interpreted with this fact in mind. . This test was performed using the Siemens  chemiluminescent method. Values obtained from  different assay methods cannot be used interchangeably. PSA levels, regardless of value, should not be interpreted as absolute evidence of the presence or absence of disease.   11/12/2014 2.0 0.0 - 4.0 ng/mL Final    Comment:    Roche ECLIA methodology. According to the American Urological Association, Serum PSA should decrease and remain at undetectable levels after radical prostatectomy. The AUA defines biochemical recurrence as an initial PSA value 0.2 ng/mL or greater followed by a subsequent confirmatory PSA value 0.2 ng/mL or greater. Values obtained with different assay methods or kits cannot be used interchangeably. Results cannot be interpreted as absolute evidence of the presence or absence of malignant disease.    No results found for: TESTOSTERONE    Studies/Results: No results found.    Assessment & Plan: Prostate cancer.  PSA is rising on finasteride and he has right prostate induration.   I am going to get him set up for a surveillance biopsy and reviewed the risks  of bleeding, infection and voiding difficulty.  He will need to be cleared to come off of warfarin.  Nodular prostate with obstruction.  He will continue the finasteride.    Meds ordered this encounter  Medications  . levofloxacin (LEVAQUIN) 750 MG tablet    Sig: Take 1 tablet (750 mg total) by mouth daily. Take 1 hour prior to prostate biopsy.    Dispense:  1 tablet    Refill:  0     Orders Placed This Encounter  Procedures  . [place]  PROSTATE BIOPSY MULTIPLE    Standing Status:   Future    Standing Expiration Date:   08/28/2020    Order Specific Question:   Reason for Exam (SYMPTOM  OR DIAGNOSIS REQUIRED)    Answer:   prostate cancer surveillance    Order Specific Question:   Preferred location?    Answer:   [facility]      Return for Next available for prostate [place] and biopsy and then f/u 2-3 weeks after with results. .   CC: Dettinger, Fransisca Kaufmann, MD      Irine Seal 05/28/2020

## 2020-05-28 ENCOUNTER — Other Ambulatory Visit: Payer: Self-pay | Admitting: Urology

## 2020-05-28 ENCOUNTER — Ambulatory Visit: Payer: Medicare Other | Admitting: Urology

## 2020-05-28 ENCOUNTER — Encounter: Payer: Self-pay | Admitting: Urology

## 2020-05-28 ENCOUNTER — Other Ambulatory Visit: Payer: Self-pay

## 2020-05-28 VITALS — BP 153/53 | HR 58 | Temp 98.6°F | Ht 65.0 in | Wt 162.0 lb

## 2020-05-28 DIAGNOSIS — N403 Nodular prostate with lower urinary tract symptoms: Secondary | ICD-10-CM

## 2020-05-28 DIAGNOSIS — C61 Malignant neoplasm of prostate: Secondary | ICD-10-CM

## 2020-05-28 DIAGNOSIS — R972 Elevated prostate specific antigen [PSA]: Secondary | ICD-10-CM

## 2020-05-28 DIAGNOSIS — R351 Nocturia: Secondary | ICD-10-CM | POA: Diagnosis not present

## 2020-05-28 DIAGNOSIS — N138 Other obstructive and reflux uropathy: Secondary | ICD-10-CM

## 2020-05-28 MED ORDER — LEVOFLOXACIN 750 MG PO TABS: 750.0000 mg | ORAL_TABLET | Freq: Every day | ORAL | 0 refills | Status: DC

## 2020-05-28 NOTE — Progress Notes (Signed)

## 2020-05-28 NOTE — Patient Instructions (Addendum)
   Patient Name: Alan [name] Appointment Time: 8:15 a.m.  Please arrive by 8:00a.m. Appointment Date: 06/25/20  Location: [facility]   Prostate Biopsy Instructions  Stop all aspirin or blood thinners (aspirin, plavix, coumadin, warfarin, motrin, ibuprofen, advil, aleve, naproxen, naprosyn) for 7 days prior to the procedure.  If you have any questions about stopping these medications, please contact your primary care physician or cardiologist.  Having a light meal prior to the procedure is recommended.  If you are diabetic or have low blood sugar please bring a small snack or glucose tablet.  A Fleets enema is needed to be purchased over the counter at a local pharmacy and used 2 hours before you scheduled appointment.  This can be purchased over the counter at any pharmacy.  Antibiotics will be administered in the clinic at the time of the procedure and 1 tablet has been sent to your pharmacy. Please take the antibiotic as prescribed.    Please bring someone with you to the procedure to drive you home.   If you have any questions or concerns, please feel free to call the office at [phone] or send a Mychart message.    Thank you, [facility]

## 2020-06-10 DEATH — deceased

## 2020-06-16 ENCOUNTER — Encounter: Payer: Self-pay | Admitting: Family Medicine

## 2020-06-16 ENCOUNTER — Ambulatory Visit (INDEPENDENT_AMBULATORY_CARE_PROVIDER_SITE_OTHER): Payer: Medicare Other | Admitting: Family Medicine

## 2020-06-16 ENCOUNTER — Other Ambulatory Visit: Payer: Self-pay

## 2020-06-16 VITALS — BP 149/58 | HR 49 | Temp 98.9°F | Ht 65.0 in | Wt 165.5 lb

## 2020-06-16 DIAGNOSIS — E1159 Type 2 diabetes mellitus with other circulatory complications: Secondary | ICD-10-CM | POA: Diagnosis not present

## 2020-06-16 DIAGNOSIS — E1169 Type 2 diabetes mellitus with other specified complication: Secondary | ICD-10-CM

## 2020-06-16 DIAGNOSIS — I10 Essential (primary) hypertension: Secondary | ICD-10-CM | POA: Diagnosis not present

## 2020-06-16 DIAGNOSIS — Z7901 Long term (current) use of anticoagulants: Secondary | ICD-10-CM | POA: Diagnosis not present

## 2020-06-16 DIAGNOSIS — Z86718 Personal history of other venous thrombosis and embolism: Secondary | ICD-10-CM | POA: Diagnosis not present

## 2020-06-16 DIAGNOSIS — E119 Type 2 diabetes mellitus without complications: Secondary | ICD-10-CM | POA: Diagnosis not present

## 2020-06-16 DIAGNOSIS — D6859 Other primary thrombophilia: Secondary | ICD-10-CM | POA: Diagnosis not present

## 2020-06-16 DIAGNOSIS — Z1211 Encounter for screening for malignant neoplasm of colon: Secondary | ICD-10-CM

## 2020-06-16 DIAGNOSIS — E785 Hyperlipidemia, unspecified: Secondary | ICD-10-CM

## 2020-06-16 LAB — COAGUCHEK XS/INR WAIVED
INR: 1.4 — ABNORMAL HIGH (ref 0.9–1.1)
Prothrombin Time: 16.5 s

## 2020-06-16 LAB — BAYER DCA HB A1C WAIVED: HB A1C (BAYER DCA - WAIVED): 7 % — ABNORMAL HIGH (ref <7.0)

## 2020-06-16 NOTE — Progress Notes (Signed)
BP (!) 149/58   Pulse (!) 49   Temp 98.9 F (37.2 C)   Ht 5' 5" (1.651 m)   Wt 165 lb 8 oz (75.1 kg)   SpO2 98%   BMI 27.54 kg/m    Subjective:   Patient ID: Alan Netters., male    DOB: February 22, 1941, 79 y.o.   MRN: [id number]  HPI: Alan Yearby. is a 79 y.o. 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 Patient denies any lightheadedness or dizziness. Patient denies headaches, blurred vision, chest pains, shortness of breath, or weakness. Denies any side effects from medication and is content with current medication.   Type 2 diabetes mellitus Patient comes in today for recheck of his diabetes. Patient has been currently taking Metformin. Patient is currently on an ACE inhibitor/ARB. Patient has seen an ophthalmologist this year. Patient denies any issues with their feet. The symptom started onset as an adult hypertension and hyperlipidemia ARE RELATED TO DM   Hyperlipidemia Patient is coming in for recheck of his hyperlipidemia. The patient is currently taking pravastatin. They deny any issues with myalgias or history of liver damage from it. They deny any focal numbness or weakness or chest pain.   Relevant past medical, surgical, family and social history reviewed and updated as indicated. Interim medical history since our last visit reviewed. Allergies and medications reviewed and updated.  Review of Systems  Constitutional: Negative for chills and fever.  Respiratory: Negative for shortness of breath and wheezing.   Cardiovascular: Negative for chest pain and leg swelling.  Musculoskeletal: Negative for back pain  and gait problem.  Skin: Negative for rash.  Neurological: Negative for dizziness, weakness and numbness.  All other systems reviewed and are negative.   Per HPI unless specifically indicated above   Allergies as of 06/16/2020      Reactions   Allopurinol Other (See Comments)   Significantly decreased WBC's   Colchicine Other (See Comments)   Significantly decreased WBC's      Medication List       Accurate as of June 16, 2020 11:31 AM. If you have any questions, ask your nurse or doctor.        amLODipine 10 MG tablet Commonly known as: NORVASC Take 1 tablet (10 mg total) by mouth every evening.   chlorthalidone 25 MG tablet Commonly known as: HYGROTON Take 1 tablet (25 mg total) by mouth daily.   finasteride 5 MG tablet Commonly known as: PROSCAR Take 1 tablet (5 mg total) by mouth daily.   fluticasone 50 MCG/ACT nasal spray Commonly known as: FLONASE Place 1 spray into both nostrils 2 (two) times daily as needed for allergies or rhinitis.   glucose blood test strip Commonly known as: ONE TOUCH ULTRA TEST Use to check BG once daily.  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 Take 1 hour prior to prostate biopsy.   lisinopril 20 MG tablet Commonly known as: ZESTRIL Take 1.5 tablets (30 mg total)  by mouth daily. Please make yearly appt with Dr. Tamala Julian for March before anymore refills. 1st attempt   metFORMIN 1000 MG tablet Commonly known as: GLUCOPHAGE TAKE 1 TABLET BY MOUTH TWICE DAILY WITH A MEAL   metoprolol succinate 100 MG 24 hr tablet Commonly known as: TOPROL-XL Take 1 tablet (100 mg total) by mouth daily. Take with or immediately following a meal.   omeprazole 20 MG capsule Commonly known as: PRILOSEC Take 1 capsule (20 mg total) by mouth daily.   ONE TOUCH ULTRA MINI w/Device  Kit 1 each by Does not apply route 2 (two) times daily.   potassium chloride SA 20 MEQ tablet Commonly known as: KLOR-CON Take 1 tablet (20 mEq total) by mouth daily.   pravastatin 40 MG tablet Commonly known as: PRAVACHOL Take 1 tablet (40 mg total) by mouth every evening.   warfarin 5 MG tablet Commonly known as: COUMADIN Take as directed by the anticoagulation clinic. If you are unsure how to take this medication, talk to your nurse or doctor. Original instructions: TAKE 1.5 (ONE & ONE-HALF) TABLETS BY MOUTH ONCE DAILY AT 6 IN THE EVENING        Objective:   BP (!) 149/58   Pulse (!) 49   Temp 98.9 F (37.2 C)   Ht 5' 5" (1.651 m)   Wt 165 lb 8 oz (75.1 kg)   SpO2 98%   BMI 27.54 kg/m   Wt Readings from Last 3 Encounters:  06/16/20 165 lb 8 oz (75.1 kg)  05/28/20 162 lb (73.5 kg)  04/30/20 165 lb (74.8 kg)    Physical Exam Vitals and nursing note reviewed.  Constitutional:      General: He is not in acute distress.    Appearance: He is well-developed. He is not diaphoretic.  Eyes:     General: No scleral icterus.    Conjunctiva/sclera: Conjunctivae normal.  Neck:     Thyroid: No thyromegaly.  Cardiovascular:     Rate and Rhythm: Normal rate and regular rhythm.     Heart sounds: Normal heart sounds. No murmur heard.   Pulmonary:     Effort: Pulmonary effort is normal. No respiratory distress.     Breath sounds: Normal breath sounds. No wheezing.  Musculoskeletal:        General: Normal range of motion.     Cervical back: Neck supple.  Lymphadenopathy:     Cervical: No cervical adenopathy.  Skin:    General: Skin is warm and dry.     Findings: No rash.  Neurological:     Mental Status: He is alert and oriented to person, place, and time.     Coordination: Coordination normal.  Psychiatric:        Behavior: Behavior normal.     Results for orders placed or performed in visit on 05/20/20  PSA  Result Value Ref Range   PSA 6.4 (H) < OR = 4.0 ng/mL     Assessment & Plan:   Problem List Items Addressed This Visit      Cardiovascular and Mediastinum   Hypertension associated with diabetes ([place])   Relevant Orders   CMP14+EGFR     Endocrine   Hyperlipidemia associated with type 2 diabetes mellitus ([place])   Relevant Orders   Lipid panel   Type 2 diabetes mellitus without complications ([place])   Relevant Orders   Bayer DCA Hb A1c Waived   CBC with Differential/Platelet   CMP14+EGFR     Hematopoietic and Hemostatic  Primary 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]     Other   Long term (current) use of anticoagulants [Z79.01] - Primary   Relevant Orders   CoaguChek XS/INR Waived   History of DVT (deep vein thrombosis)   Relevant Orders   CBC with Differential/Platelet    Other Visit Diagnoses    Colon cancer screening       Relevant Orders   Ambulatory referral to Gastroenterology      Description   Increase current dose to take 1 tablet every day of the week except for Mondays, Wednesdays and Saturdays take 1 1/2 tablets INR was 1.4 (goal 2-3)   Follow-up 4 weeks    Patient is having a procedure in 2 weeks so we will recheck his INR 2 weeks after that.  We will check other blood work today, seems like patient is doing well, says his blood sugars are running good as well. Follow up plan: Return in about 4 weeks (around 07/14/2020), or if symptoms worsen or fail to improve, for INR recheck.  Counseling provided for all of the vaccine components Orders Placed This Encounter  Procedures  . CoaguChek XS/INR Waived  . Bayer DCA Hb A1c Waived  . CBC with Differential/Platelet  . CMP14+EGFR  . Lipid panel  . Ambulatory referral to Gastroenterology    Caryl Pina, MD [facility] 06/16/2020, 11:31 AM

## 2020-06-17 LAB — CBC WITH DIFFERENTIAL/PLATELET
Basophils Absolute: 0 10*3/uL (ref 0.0–0.2)
Basos: 0 %
EOS (ABSOLUTE): 0.1 10*3/uL (ref 0.0–0.4)
Eos: 2 %
Hematocrit: 35.2 % — ABNORMAL LOW (ref 37.5–51.0)
Hemoglobin: 11.3 g/dL — ABNORMAL LOW (ref 13.0–17.7)
Immature Grans (Abs): 0 10*3/uL (ref 0.0–0.1)
Immature Granulocytes: 0 %
Lymphocytes Absolute: 1.2 10*3/uL (ref 0.7–3.1)
Lymphs: 22 %
MCH: 29.4 pg (ref 26.6–33.0)
MCHC: 32.1 g/dL (ref 31.5–35.7)
MCV: 92 fL (ref 79–97)
Monocytes Absolute: 0.5 10*3/uL (ref 0.1–0.9)
Monocytes: 9 %
Neutrophils Absolute: 3.7 10*3/uL (ref 1.4–7.0)
Neutrophils: 67 %
Platelets: 249 10*3/uL (ref 150–450)
RBC: 3.84 x10E6/uL — ABNORMAL LOW (ref 4.14–5.80)
RDW: 14.6 % (ref 11.6–15.4)
WBC: 5.6 10*3/uL (ref 3.4–10.8)

## 2020-06-17 LAB — CMP14+EGFR
ALT: 14 IU/L (ref 0–44)
AST: 22 IU/L (ref 0–40)
Albumin/Globulin Ratio: 1.1 — ABNORMAL LOW (ref 1.2–2.2)
Albumin: 3.7 g/dL (ref 3.7–4.7)
Alkaline Phosphatase: 69 IU/L (ref 48–121)
BUN/Creatinine Ratio: 19 (ref 10–24)
BUN: 21 mg/dL (ref 8–27)
Bilirubin Total: 0.6 mg/dL (ref 0.0–1.2)
CO2: 26 mmol/L (ref 20–29)
Calcium: 9.7 mg/dL (ref 8.6–10.2)
Chloride: 100 mmol/L (ref 96–106)
Creatinine, Ser: 1.09 mg/dL (ref 0.76–1.27)
GFR calc Af Amer: 75 mL/min/{1.73_m2} (ref >59)
GFR calc non Af Amer: 65 mL/min/{1.73_m2} (ref >59)
Globulin, Total: 3.3 g/dL (ref 1.5–4.5)
Glucose: 160 mg/dL — ABNORMAL HIGH (ref 65–99)
Potassium: 3.8 mmol/L (ref 3.5–5.2)
Sodium: 139 mmol/L (ref 134–144)
Total Protein: 7 g/dL (ref 6.0–8.5)

## 2020-06-17 LAB — LIPID PANEL
Chol/HDL Ratio: 4.2 ratio (ref 0.0–5.0)
Cholesterol, Total: 104 mg/dL (ref 100–199)
HDL: 25 mg/dL — ABNORMAL LOW (ref >39)
LDL Chol Calc (NIH): 42 mg/dL (ref 0–99)
Triglycerides: 234 mg/dL — ABNORMAL HIGH (ref 0–149)
VLDL Cholesterol Cal: 37 mg/dL (ref 5–40)

## 2020-06-22 ENCOUNTER — Ambulatory Visit (INDEPENDENT_AMBULATORY_CARE_PROVIDER_SITE_OTHER): Payer: Medicare Other | Admitting: Licensed Clinical Social Worker

## 2020-06-22 DIAGNOSIS — C61 Malignant neoplasm of prostate: Secondary | ICD-10-CM

## 2020-06-22 DIAGNOSIS — I480 Paroxysmal atrial fibrillation: Secondary | ICD-10-CM | POA: Diagnosis not present

## 2020-06-22 DIAGNOSIS — E1169 Type 2 diabetes mellitus with other specified complication: Secondary | ICD-10-CM

## 2020-06-22 DIAGNOSIS — I10 Essential (primary) hypertension: Secondary | ICD-10-CM | POA: Diagnosis not present

## 2020-06-22 DIAGNOSIS — E785 Hyperlipidemia, unspecified: Secondary | ICD-10-CM | POA: Diagnosis not present

## 2020-06-22 DIAGNOSIS — M109 Gout, unspecified: Secondary | ICD-10-CM

## 2020-06-22 DIAGNOSIS — E1159 Type 2 diabetes mellitus with other circulatory complications: Secondary | ICD-10-CM

## 2020-06-22 DIAGNOSIS — K219 Gastro-esophageal reflux disease without esophagitis: Secondary | ICD-10-CM

## 2020-06-22 DIAGNOSIS — Z86718 Personal history of other venous thrombosis and embolism: Secondary | ICD-10-CM

## 2020-06-22 NOTE — Chronic Care Management (AMB) (Signed)
Chronic Care Management    Clinical Social Work Follow Up Note  06/22/2020 Name: Alan [name]. MRN: [id number] DOB: 20-Dec-1940  [name] is a 79 y.o. year old male who is a primary care patient of Dettinger, Fransisca Kaufmann, MD. The CCM team was consulted for assistance with [employer] .   Review of patient status, including review of consultants reports, other relevant assessments, and collaboration with appropriate care team members and the patient's provider was performed as part of comprehensive patient evaluation and provision of chronic care management services.    SDOH (Social Determinants of Health) assessments performed: Yes; risk for tobacco use; risk for depression; risk for stress  SDOH Interventions     Most Recent Value  SDOH Interventions  Depression Interventions/Treatment  --  [informed spouse of client of LCSW support and RNCM support]        Chronic Care Management from 06/22/2020 in [facility]  PHQ-9 Total Score 2     GAD 7 : Generalized Anxiety Score 06/22/2020  Nervous, Anxious, on Edge 0  Control/stop worrying 0  Worry too much - different things 0  Trouble relaxing 0  Restless 0  Easily annoyed or irritable 0  Afraid - awful might happen 0  Total GAD 7 Score 0  Anxiety Difficulty Not difficult at all   Outpatient Encounter Medications as of 06/22/2020  Medication Sig   amLODipine (NORVASC) 10 MG tablet Take 1 tablet (10 mg total) by mouth every evening.   Blood Glucose Monitoring Suppl (ONE TOUCH ULTRA MINI) w/Device KIT 1 each by Does not apply route 2 (two) times daily.   chlorthalidone (HYGROTON) 25 MG tablet Take 1 tablet (25 mg total) by mouth daily.   Ferrous Sulfate (IRON) 325 (65 Fe) MG TABS Take 1 tablet by mouth once daily with breakfast   finasteride (PROSCAR) 5 MG tablet Take 1 tablet (5 mg total) by mouth daily.   fluticasone (FLONASE) 50 MCG/ACT nasal spray Place 1 spray into both nostrils 2 (two) times  daily as needed for allergies or rhinitis.   glucose blood (ONE TOUCH ULTRA TEST) test strip Use to check BG once daily.  Dx:  Type 2 DM controlled E11.9   levofloxacin (LEVAQUIN) 750 MG tablet Take 1 tablet (750 mg total) by mouth daily. Take 1 hour prior to prostate biopsy.   lisinopril (ZESTRIL) 20 MG tablet Take 1.5 tablets (30 mg total) by mouth daily. Please make yearly appt with Dr. Tamala Julian for March before anymore refills. 1st attempt   metFORMIN (GLUCOPHAGE) 1000 MG tablet TAKE 1 TABLET BY MOUTH TWICE DAILY WITH A MEAL   metoprolol succinate (TOPROL-XL) 100 MG 24 hr tablet Take 1 tablet (100 mg total) by mouth daily. Take with or immediately following a meal.   omeprazole (PRILOSEC) 20 MG capsule Take 1 capsule (20 mg total) by mouth daily.   potassium chloride SA (K-DUR) 20 MEQ tablet Take 1 tablet (20 mEq total) by mouth daily.   pravastatin (PRAVACHOL) 40 MG tablet Take 1 tablet (40 mg total) by mouth every evening.   warfarin (COUMADIN) 5 MG tablet TAKE 1.5 (ONE & ONE-HALF) TABLETS BY MOUTH ONCE DAILY AT 6 IN THE EVENING   No facility-administered encounter medications on file as of 06/22/2020.    Goals Addressed              This Visit's Progress     Client will talk with LCSW in next 30 days about health needs of clinet and  client completion of daily activities (pt-stated)        CARE PLAN ENTRY   Current Barriers:   Patient with chronic diagnoses of HLD, Gout, Prostate Cancer, GERD, HTN, Type 2 DM, Atrial Fibrillation, Hx DVT  Clinical Social Work Clinical Goal(s):   LCSW will call client in next 30 days to discuss health needs of client and to discuss client completion of daily activites  Interventions:  Talked with [name] ,spouse of client, about client recent appointment at [facility]. Talked with Arville Go about pain issues of client Talked with Arville Go about appetite of client Talked with Arville Go about ambulation needs of client Talked with Arville Go about social  support of client(support from son and grandchildren) Talked with Arville Go about upcoming medical appointments for client Talked with Arville Go about client completion of ADLs Talked with Arville Go about medication procurement of client Talked with Arville Go about client relaxation techniques (watches TV, repairing items at home, enjoys being outdoors, caring for yard) Talked with Arville Go about client's appointment this Friday at [facility] for Colon screening. Talked with Arville Go about health needs of client Discussed with [facility] CCM program support . Encouraged client or [name] to call RNCM as needed to discuss nursing needs of client  Patient Self Care Activities:   Completes ADLs independently Drives to needed appointments   Patient Self Care Deficits:   Slight hearing deficits  Initial goal documentation        Follow Up Plan: LCSW to call client or Lorene Samaan, spouse of client, in next 4 weeks to discuss health needs of client and to discuss client completion of daily activities  Norva Riffle.Candy Leverett MSW, LCSW Licensed Clinical Social Worker [facility] Care Management [phone]

## 2020-06-22 NOTE — Patient Instructions (Addendum)
Licensed Clinical Social Worker Visit Information  Goals we discussed today:  Goals Addressed              This Visit's Progress   .  Client will talk with LCSW in next 30 days about health needs of clinet and client completion of daily activities (pt-stated)        CARE PLAN ENTRY   Current Barriers:  . Patient with chronic diagnoses of HLD, Gout, Prostate Cancer, GERD, HTN, Type 2 DM, Atrial Fibrillation, Hx DVT  Clinical Social Work Clinical Goal(s):  Marland Kitchen LCSW will call client in next 30 days to discuss health needs of client and to discuss client completion of daily activites  Interventions:  Talked with [name] ,spouse of client, about client recent appointment at [facility]. Talked with Alan Go about pain issues of client Talked with Alan Go about appetite of client Talked with Alan Go about ambulation needs of client Talked with Alan Go about social support of client(support from son and grandchildren) Talked with Alan Go about upcoming medical appointments for client Talked with Alan Go about client completion of ADLs Talked with Alan Go about medication procurement of client Talked with Alan Go about client relaxation techniques (watches TV, repairing items at home, enjoys being outdoors, caring for yard) Talked with Alan Go about client's appointment this Friday at [facility] for Colon screening. Talked with Alan Go about health needs of client Discussed with [facility] CCM program support . Encouraged client or Alan [name] to call RNCM as needed to discuss nursing needs of client  Patient Self Care Activities:   Completes ADLs independently Drives to needed appointments   Patient Self Care Deficits:   Slight hearing deficits  Initial goal documentation       Materials Provided: No  Follow Up Plan: LCSW to call client or Alan [name], spouse of client, in next 4 weeks to discuss health needs of client and to discuss client completion of daily activities  The patient Alan [name],  spouse of client,verbalized understanding of instructions provided today and declined a print copy of patient instruction materials.   Norva Riffle.Alan [name] MSW, LCSW Licensed Clinical Social Worker [facility] [phone]

## 2020-06-23 ENCOUNTER — Telehealth: Payer: Self-pay | Admitting: Urology

## 2020-06-23 ENCOUNTER — Other Ambulatory Visit: Payer: Self-pay

## 2020-06-23 DIAGNOSIS — R972 Elevated prostate specific antigen [PSA]: Secondary | ICD-10-CM

## 2020-06-23 MED ORDER — LEVOFLOXACIN 750 MG PO TABS: 750.0000 mg | ORAL_TABLET | Freq: Every day | ORAL | 0 refills | Status: DC

## 2020-06-23 NOTE — Telephone Encounter (Signed)
Pt needed levaquin resubmitted to pharmacy. Per pt- pharmacy did not receive-  Rx sent to [facility]. Pharmacy location confirmed with pt.

## 2020-06-23 NOTE — Telephone Encounter (Signed)
Pt requests a call from the nurse . He states a medication was not sent to the pharmacy.

## 2020-06-25 ENCOUNTER — Encounter (HOSPITAL_COMMUNITY): Payer: Self-pay

## 2020-06-25 ENCOUNTER — Other Ambulatory Visit: Payer: Self-pay

## 2020-06-25 ENCOUNTER — Ambulatory Visit (HOSPITAL_BASED_OUTPATIENT_CLINIC_OR_DEPARTMENT_OTHER): Payer: Medicare Other | Admitting: Urology

## 2020-06-25 ENCOUNTER — Other Ambulatory Visit: Payer: Self-pay | Admitting: Urology

## 2020-06-25 ENCOUNTER — Ambulatory Visit (HOSPITAL_COMMUNITY)
Admission: RE | Admit: 2020-06-25 | Discharge: 2020-06-25 | Disposition: A | Discharge status: Home / Self Care | Payer: Medicare Other | Source: Ambulatory Visit | Attending: Urology | Admitting: Urology

## 2020-06-25 ENCOUNTER — Encounter: Payer: Self-pay | Admitting: Urology

## 2020-06-25 ENCOUNTER — Telehealth: Payer: Self-pay

## 2020-06-25 DIAGNOSIS — C61 Malignant neoplasm of prostate: Secondary | ICD-10-CM

## 2020-06-25 MED ORDER — GENTAMICIN SULFATE 40 MG/ML IJ SOLN
INTRAMUSCULAR | Status: AC
  Administered 2020-06-25: 80 mg via INTRAMUSCULAR
  Filled 2020-06-25: qty 2

## 2020-06-25 MED ORDER — GENTAMICIN SULFATE 40 MG/ML IJ SOLN: 80.0000 mg | Freq: Once | INTRAMUSCULAR | Status: AC

## 2020-06-25 MED ORDER — LIDOCAINE HCL (PF) 2 % IJ SOLN
INTRAMUSCULAR | Status: AC
  Administered 2020-06-25: 10 mL
  Filled 2020-06-25: qty 10

## 2020-06-25 NOTE — Telephone Encounter (Signed)
-----   Message from Irine Seal, MD sent at 06/25/2020  8:44 AM EDT ----- Please let him know that he can result the warfarin in 48hrs if he isn't bleeding.

## 2020-06-25 NOTE — Progress Notes (Signed)
Prostate Biopsy Procedure   CC: Prostate cancer and elevated PSA.    Alan [name] returns today for a surveillance biopsy for his history of low grade, low stage prostate cancer that was diagnosed in 2009 with atypia on repeat biopsy in 2019.  He is on finasteride and his PSA is rising.    Informed consent was obtained after discussing risks/benefits of the procedure.  A time out was performed to ensure correct patient identity.  Pre-Procedure: - Last PSA Level:  Lab Results  Component Value Date   PSA 6.4 (H) 05/20/2020   PSA 2.0 11/12/2014     - Rocephin 1 gm IM given prophylactically - Levaquin 750 mg administered PO -Transrectal Ultrasound performed using the 10MHz probe.  - Seminal Vesicles: Normal - Prostate: The PZ is unremarkable with the exception of a vaguely hypoechoic area in the right lateral mid to apical prostate and a protruding nodule that is hyperechoic at the left base laterally.  The TZ is asymmetrically enlarged with the right > left and the right TZ is uniformly hyperechoic.  Minimal stones are seen.   - Prostate volume:  46 gm - Middle lobe: NA - Hypoechoic/Hyperechoic lesions: Left lateral base, right lateral mid/apex.  - Prostate calculi: minimal.  Procedure: - Prostate block performed using 10 cc 1% lidocaine and needle biopsies taken from sextant areas, a total of 12 under ultrasound guidance.   No additional cores were obtained.   Post-Procedure: - Patient tolerated the procedure well - He was counseled to seek immediate medical attention if experiences any severe pain, significant bleeding, or fevers - He will be called with the biopsy results when available.

## 2020-06-25 NOTE — Telephone Encounter (Signed)
Pt wife notified.

## 2020-06-30 ENCOUNTER — Other Ambulatory Visit: Payer: Self-pay | Admitting: Urology

## 2020-06-30 DIAGNOSIS — C61 Malignant neoplasm of prostate: Secondary | ICD-10-CM

## 2020-07-06 ENCOUNTER — Encounter (HOSPITAL_COMMUNITY)
Admission: RE | Admit: 2020-07-06 | Discharge: 2020-07-06 | Disposition: A | Discharge status: Home / Self Care | Payer: Medicare Other | Source: Ambulatory Visit | Attending: Urology | Admitting: Urology

## 2020-07-06 ENCOUNTER — Other Ambulatory Visit: Payer: Self-pay

## 2020-07-06 DIAGNOSIS — C61 Malignant neoplasm of prostate: Secondary | ICD-10-CM | POA: Insufficient documentation

## 2020-07-06 MED ORDER — TECHNETIUM TC 99M MEDRONATE IV KIT
20.0000 | PACK | Freq: Once | INTRAVENOUS | Status: AC | PRN
  Administered 2020-07-06: 19.53 via INTRAVENOUS

## 2020-07-07 NOTE — Progress Notes (Signed)
Results sent via my chart 

## 2020-07-08 ENCOUNTER — Telehealth: Payer: Self-pay

## 2020-07-08 NOTE — Telephone Encounter (Signed)
Pts wife called wanting to know imaging results. I notified her of bone scan result per Dr. Jeffie Pollock. Also told her CT result was not back yet.

## 2020-07-10 ENCOUNTER — Other Ambulatory Visit: Payer: Self-pay | Admitting: Family Medicine

## 2020-07-10 DIAGNOSIS — E1169 Type 2 diabetes mellitus with other specified complication: Secondary | ICD-10-CM

## 2020-07-14 ENCOUNTER — Other Ambulatory Visit: Payer: Self-pay

## 2020-07-14 ENCOUNTER — Encounter: Payer: Self-pay | Admitting: Family Medicine

## 2020-07-14 ENCOUNTER — Ambulatory Visit (INDEPENDENT_AMBULATORY_CARE_PROVIDER_SITE_OTHER): Payer: Medicare Other | Admitting: Family Medicine

## 2020-07-14 ENCOUNTER — Telehealth: Payer: Self-pay

## 2020-07-14 VITALS — BP 144/60 | HR 56 | Temp 97.4°F | Ht 65.0 in | Wt 167.0 lb

## 2020-07-14 DIAGNOSIS — D6859 Other primary thrombophilia: Secondary | ICD-10-CM

## 2020-07-14 DIAGNOSIS — Z7901 Long term (current) use of anticoagulants: Secondary | ICD-10-CM

## 2020-07-14 DIAGNOSIS — Z86718 Personal history of other venous thrombosis and embolism: Secondary | ICD-10-CM | POA: Diagnosis not present

## 2020-07-14 DIAGNOSIS — I480 Paroxysmal atrial fibrillation: Secondary | ICD-10-CM | POA: Diagnosis not present

## 2020-07-14 LAB — COAGUCHEK XS/INR WAIVED
INR: 3.1 — ABNORMAL HIGH (ref 0.9–1.1)
Prothrombin Time: 37.3 s

## 2020-07-14 NOTE — Progress Notes (Signed)
BP (!) 144/60   Pulse (!) 56   Temp (!) 97.4 F (36.3 C)   Ht _0  (1.651 m)   Wt 167 lb (75.8 kg)   SpO2 100%   BMI 27.79 kg/m    Subjective:   Patient ID: Alan [name]., male    DOB: 1941-06-18, 79 y.o.   MRN: [id number]  HPI: Alan [name]. is a 79 y.o. male presenting on 07/14/2020 for Atrial Fibrillation   HPI Coumadin recheck Target goal: 2.0-3.0 Reason on anticoagulation: Paroxysmal A. fib and history of DVT Patient denies any bruising or bleeding except for his hematuria for which she sees Dr. Jeffie Pollock or chest pain or palpitations   Relevant past medical, surgical, family and social history reviewed and updated as indicated. Interim medical history since our last visit reviewed. Allergies and medications reviewed and updated.  Review of Systems  Constitutional: Negative for chills and fever.  Respiratory: Negative for shortness of breath and wheezing.   Cardiovascular: Negative for chest pain and leg swelling.  Genitourinary: Positive for hematuria (Following up with Dr. Jeffie Pollock on this).  Musculoskeletal: Negative for back pain and gait problem.  Skin: Negative for rash.  All other systems reviewed and are negative.   Per HPI unless specifically indicated above   Allergies as of 07/14/2020      Reactions   Allopurinol Other (See Comments)   Significantly decreased WBC's   Colchicine Other (See Comments)   Significantly decreased WBC's      Medication List       Accurate as of July 14, 2020 11:54 AM. If you have any questions, ask your nurse or doctor.        amLODipine 10 MG tablet Commonly known as: NORVASC Take 1 tablet (10 mg total) by mouth every evening.   chlorthalidone 25 MG tablet Commonly known as: HYGROTON Take 1 tablet (25 mg total) by mouth daily.   finasteride 5 MG tablet Commonly known as: PROSCAR Take 1 tablet (5 mg total) by mouth daily.   fluticasone 50 MCG/ACT nasal spray Commonly known as: FLONASE Place 1 spray into both  nostrils 2 (two) times daily as needed for allergies or rhinitis.   glucose blood test strip Commonly known as: ONE TOUCH ULTRA TEST Use to check BG once daily.  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 Take 1 hour prior to prostate biopsy.   lisinopril 20 MG tablet Commonly known as: ZESTRIL Take 1.5 tablets (30 mg total) by mouth daily. Please make yearly appt with Dr. Tamala Julian for March before anymore refills. 1st attempt   metFORMIN 1000 MG tablet Commonly known as: GLUCOPHAGE TAKE 1 TABLET BY MOUTH TWICE DAILY WITH A MEAL   metoprolol succinate 100 MG 24 hr tablet Commonly known as: TOPROL-XL Take 1 tablet (100 mg total) by mouth daily. Take with or immediately following a meal.   omeprazole 20 MG capsule Commonly known as: PRILOSEC Take 1 capsule (20 mg total) by mouth daily.   ONE TOUCH ULTRA MINI w/Device Kit 1 each by Does not apply route 2 (two) times daily.   potassium chloride SA 20 MEQ tablet Commonly known as: KLOR-CON Take 1 tablet (20 mEq total) by mouth daily.   pravastatin 40 MG tablet Commonly known as: PRAVACHOL Take 1 tablet (40  mg total) by mouth every evening.   warfarin 5 MG tablet Commonly known as: COUMADIN Take as directed by the anticoagulation clinic. If you are unsure how to take this medication, talk to your nurse or doctor. Original instructions: TAKE 1.5 (ONE & ONE-HALF) TABLETS BY MOUTH ONCE DAILY AT 6 IN THE EVENING        Objective:   BP (!) 144/60   Pulse (!) 56   Temp (!) 97.4 F (36.3 C)   Ht _0  (1.651 m)   Wt 167 lb (75.8 kg)   SpO2 100%   BMI 27.79 kg/m   Wt Readings from Last 3 Encounters:  07/14/20 167 lb (75.8 kg)  06/16/20 165 lb 8 oz (75.1 kg)  05/28/20 162 lb (73.5 kg)    Physical Exam Vitals and nursing note reviewed.  Constitutional:        General: He is not in acute distress.    Appearance: He is not ill-appearing.  Skin:    General: Skin is warm.     Findings: No bruising.  Neurological:     Mental Status: He is alert.       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  Counseling provided for all of the vaccine components Orders Placed This Encounter  Procedures  . CoaguChek XS/INR Short, MD [facility] 07/14/2020, 11:54 AM

## 2020-07-14 NOTE — Telephone Encounter (Signed)
Pts wife called wanting results of biopsy. I explained they have an appt  To discuss results of that and other tests on 8/20.

## 2020-07-16 ENCOUNTER — Ambulatory Visit: Payer: Medicare Other | Admitting: Urology

## 2020-07-16 ENCOUNTER — Encounter: Payer: Self-pay | Admitting: Gastroenterology

## 2020-07-19 ENCOUNTER — Other Ambulatory Visit: Payer: Self-pay | Admitting: Family Medicine

## 2020-07-19 DIAGNOSIS — K219 Gastro-esophageal reflux disease without esophagitis: Secondary | ICD-10-CM

## 2020-07-23 ENCOUNTER — Telehealth: Payer: Medicare Other

## 2020-07-23 ENCOUNTER — Other Ambulatory Visit: Payer: Self-pay | Admitting: Family Medicine

## 2020-07-23 DIAGNOSIS — E785 Hyperlipidemia, unspecified: Secondary | ICD-10-CM

## 2020-07-23 DIAGNOSIS — E1169 Type 2 diabetes mellitus with other specified complication: Secondary | ICD-10-CM

## 2020-07-28 ENCOUNTER — Other Ambulatory Visit: Payer: Self-pay

## 2020-07-28 DIAGNOSIS — C61 Malignant neoplasm of prostate: Secondary | ICD-10-CM

## 2020-07-29 ENCOUNTER — Other Ambulatory Visit: Payer: Self-pay

## 2020-07-29 ENCOUNTER — Other Ambulatory Visit: Payer: Self-pay | Admitting: Family Medicine

## 2020-07-29 ENCOUNTER — Ambulatory Visit (HOSPITAL_COMMUNITY)
Admission: RE | Admit: 2020-07-29 | Discharge: 2020-07-29 | Disposition: A | Discharge status: Home / Self Care | Payer: Medicare Other | Source: Ambulatory Visit | Attending: Urology | Admitting: Urology

## 2020-07-29 DIAGNOSIS — I152 Hypertension secondary to endocrine disorders: Secondary | ICD-10-CM

## 2020-07-29 DIAGNOSIS — E1159 Type 2 diabetes mellitus with other circulatory complications: Secondary | ICD-10-CM

## 2020-07-29 DIAGNOSIS — C61 Malignant neoplasm of prostate: Secondary | ICD-10-CM | POA: Insufficient documentation

## 2020-07-29 DIAGNOSIS — K4090 Unilateral inguinal hernia, without obstruction or gangrene, not specified as recurrent: Secondary | ICD-10-CM | POA: Diagnosis not present

## 2020-07-29 DIAGNOSIS — E1169 Type 2 diabetes mellitus with other specified complication: Secondary | ICD-10-CM

## 2020-07-29 DIAGNOSIS — E785 Hyperlipidemia, unspecified: Secondary | ICD-10-CM

## 2020-07-29 DIAGNOSIS — I700 Atherosclerosis of aorta: Secondary | ICD-10-CM | POA: Diagnosis not present

## 2020-07-29 DIAGNOSIS — K5730 Diverticulosis of large intestine without perforation or abscess without bleeding: Secondary | ICD-10-CM | POA: Diagnosis not present

## 2020-07-29 LAB — POCT I-STAT CREATININE: Creatinine, Ser: 1.3 mg/dL — ABNORMAL HIGH (ref 0.61–1.24)

## 2020-07-29 MED ORDER — IOHEXOL 300 MG/ML  SOLN
100.0000 mL | Freq: Once | INTRAMUSCULAR | Status: AC | PRN
  Administered 2020-07-29: 100 mL via INTRAVENOUS

## 2020-07-29 NOTE — Progress Notes (Signed)
Subjective:  1. Prostate cancer ([place])   2. 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 On initial biopsy, he had only 5% in one core and elected active survelliance. He had a repeat biopsy on 12/24/13 that showed a single core with <5% Gleason 6 in the right mid lateral prostate. His last biopsy was on 06/28/18 and only showed 2 cores with atypia. He is on finasteride and has had  a slowly rising PSA that was up to 6.4 prior to his most recent biopsy on 06/25/20.  The nadir on finasteride was 1.8.    The repeat biopsy demonstrated 6 positive cores with 2 cores with 7(4+3) on the right and 4 cores with low volume Gleason 6 disease.    His prostate volume is 12m. IPSS is 9.  A bone scan on 07/06/20 was negative.    The CT on 07/29/20 was negative for mets.   His PSA was 6.2 prior to therapy starting finasteride.      IPSS    Row Name 07/30/20 1600         International Prostate Symptom Score   How often have you had the sensation of not emptying your bladder? About half the time     How often have you had to urinate less than every two hours? Less than half the time     How often have you found you stopped and started again several times when you urinated? Less than 1 in 5 times     How often have you found it difficult to postpone urination? Not at All     How often have you had a weak urinary stream? Less than 1 in 5 times     How often have you had to strain to start urination? Not at All     How many times did you typically get up at night to urinate? 2 Times     Total IPSS Score 9       Quality of Life due to urinary symptoms   If you were to spend the rest of your life with your urinary condition just the way it is now how would you feel about that? Mostly Satisfied              ROS:  ROS:  A complete review of systems was performed.  All systems are negative except for pertinent findings as noted.   ROS  Allergies  Allergen Reactions  . Allopurinol Other (See Comments)    Significantly decreased WBC's  . Colchicine Other (See Comments)    Significantly decreased WBC's    Outpatient Encounter Medications as of 07/30/2020  Medication Sig  . amLODipine (NORVASC) 10 MG tablet TAKE 1 TABLET BY MOUTH ONCE DAILY IN THE EVENING  . Blood Glucose Monitoring Suppl (ONE TOUCH ULTRA MINI) w/Device KIT 1 each by Does not apply route 2 (two) times daily.  . chlorthalidone (HYGROTON) 25 MG tablet Take 1 tablet (25 mg total) by mouth daily.  . Ferrous Sulfate (IRON) 325 (65 Fe) MG TABS Take 1 tablet by mouth once daily with breakfast  . finasteride (PROSCAR) 5 MG tablet Take 1 tablet (5 mg total) by mouth daily.  . fluticasone (FLONASE) 50 MCG/ACT nasal spray Place 1 spray into both nostrils 2 (two) times daily as  needed for allergies or rhinitis.  Marland Kitchen glucose blood (ONE TOUCH ULTRA TEST) test strip Use to check BG once daily.  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 levofloxacin (LEVAQUIN) 750 MG tablet Take 1 tablet (750 mg total) by mouth daily. Take 1 hour prior to prostate biopsy.  Marland Kitchen lisinopril (ZESTRIL) 20 MG tablet Take 1.5 tablets (30 mg total) by mouth daily. Please make yearly appt with Dr. Tamala Julian for March before anymore refills. 1st attempt  . metFORMIN (GLUCOPHAGE) 1000 MG tablet TAKE 1 TABLET BY MOUTH TWICE DAILY WITH A MEAL  . metoprolol succinate (TOPROL-XL) 100 MG 24 hr tablet Take 1 tablet (100 mg total) by mouth daily. Take with or immediately following a meal.  . omeprazole (PRILOSEC) 20 MG capsule Take 1 capsule by mouth once daily  . potassium chloride SA (K-DUR) 20 MEQ tablet Take 1 tablet (20 mEq total) by mouth daily.  . pravastatin (PRAVACHOL) 40 MG tablet TAKE 1 TABLET BY MOUTH ONCE DAILY IN THE EVENING  . warfarin (COUMADIN) 5 MG tablet TAKE 1.5 (ONE &  ONE-HALF) TABLETS BY MOUTH ONCE DAILY AT 6 IN THE EVENING  . [DISCONTINUED] amLODipine (NORVASC) 10 MG tablet Take 1 tablet (10 mg total) by mouth every evening.  . [DISCONTINUED] pravastatin (PRAVACHOL) 40 MG tablet TAKE 1 TABLET BY MOUTH ONCE DAILY IN THE EVENING   No facility-administered encounter medications on file as of 07/30/2020.    Past Medical History:  Diagnosis Date  . Adenomatous colon polyp   . Agranulocytosis ([place])   . AKI (acute kidney injury) ([place]) 09/22/2019  . Allergy   . Atrial fibrillation ([place]) 04/19/2016  . Cataract    small  . Diabetes mellitus   . Diverticulosis   . ED (erectile dysfunction)   . 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 Gallstones   . GERD (gastroesophageal reflux disease)   . Gout   . Heart murmur   . Hyperlipidemia   . Hypertension   . Internal hemorrhoids   . Pancreatitis   . 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  . Prostate cancer ([place])    Followed by Dr. Jeffie Pollock     Past Surgical History:  Procedure Laterality Date  . CHOLECYSTECTOMY N/A 11/26/2015   Procedure: LAPAROSCOPIC CHOLECYSTECTOMY;  Surgeon: Aviva Signs, MD;  Location: [facility];  Service: General;  Laterality: N/A;  . COLONOSCOPY  2012   [name]: moderate sigmoid diverticulosis, internal hemorrhoids. next tcs 2017  . FLEXIBLE BRONCHOSCOPY Bilateral 04/14/2016   Procedure: FLEXIBLE BRONCHOSCOPY;  Surgeon: Sinda Du, MD;  Location: [facility];  Service: Cardiopulmonary;  Laterality: Bilateral;  . SIGMOIDOSCOPY    . UMBILICAL HERNIA REPAIR N/A 11/26/2015   Procedure: UMBILICAL HERNIORRHAPHY;  Surgeon: Aviva Signs, MD;  Location: [facility];  Service: General;  Laterality: N/A;    Social History   Socioeconomic History  . Marital status: Married    Spouse name: Arville Go  . Number of children: 4  . Years of education: Not on file  . Highest education level: 10th grade  Occupational History  . Occupation: Retired     Fish farm manager:  [employer]  Tobacco Use  . Smoking status: Former Smoker    Types: Cigarettes    Quit date: 12/11/1986    Years since quitting: 33.6  . Smokeless tobacco: Never Used  . Tobacco comment: Quit x 50 years  Vaping Use  . Vaping Use: Never used  Substance and Sexual Activity  . Alcohol use: No  .  Drug use: No  . Sexual activity: Yes  Other Topics Concern  . Not on file  Social History Narrative  . Not on file   Social Determinants of Health   Financial Resource Strain:   . Difficulty of Paying Living Expenses: Not on file  Food Insecurity:   . Worried About Charity fundraiser in the Last Year: Not on file  . Ran Out of Food in the Last Year: Not on file  Transportation Needs:   . Lack of Transportation (Medical): Not on file  . Lack of Transportation (Non-Medical): Not on file  Physical Activity:   . Days of Exercise per Week: Not on file  . Minutes of Exercise per Session: Not on file  Stress:   . Feeling of Stress : Not on file  Social Connections:   . Frequency of Communication with Friends and Family: Not on file  . Frequency of Social Gatherings with Friends and Family: Not on file  . Attends Religious Services: Not on file  . Active Member of Clubs or Organizations: Not on file  . Attends Archivist Meetings: Not on file  . Marital Status: Not on file  Intimate Partner Violence:   . Fear of Current or Ex-Partner: Not on file  . Emotionally Abused: Not on file  . Physically Abused: Not on file  . Sexually Abused: Not on file    Family History  Problem Relation Age of Onset  . Stroke Mother   . Hypertension Mother   . Alzheimer's disease Mother   . Hypertension Father   . Brain cancer Sister   . Breast cancer Sister   . Hypertension Sister   . Deep vein thrombosis Brother   . Hypertension Brother   . Colon cancer Neg Hx   . Pancreatic disease Neg Hx        Objective: Vitals:   07/30/20 1618  BP: (!) 167/70  Pulse: (!) 58  Temp: 98.2 F  (36.8 C)     Physical Exam  Lab Results:  Results for orders placed or performed in visit on 07/30/20 (from the past 24 hour(s))  Urinalysis, Routine w reflex microscopic     Status: Abnormal   Collection Time: 07/30/20  4:19 PM  Result Value Ref Range   Specific Gravity, UA 1.025 1.005 - 1.030   pH, UA 5.5 5.0 - 7.5   Color, UA Yellow Yellow   Appearance Ur Clear Clear   Leukocytes,UA Negative Negative   Protein,UA Negative Negative/Trace   Glucose, UA Negative Negative   Ketones, UA Trace (A) Negative   RBC, UA Negative Negative   Bilirubin, UA Negative Negative   Urobilinogen, Ur 0.2 0.2 - 1.0 mg/dL   Nitrite, UA Negative Negative   Microscopic Examination Comment    Narrative   Performed at:  [facility] [address] [place], [place]  [id number] Lab Director: Mina Marble MT, Phone:  [phone]    BMET Recent Labs    07/29/20 1107  CREATININE 1.30*   PSA PSA  Date Value Ref Range Status  05/20/2020 6.4 (H) < OR = 4.0 ng/mL Final    Comment:    The total PSA value from this assay system is  standardized against the WHO standard. The test  result will be approximately 20% lower when compared  to the equimolar-standardized total PSA (Beckman  Coulter). Comparison of serial PSA results should be  interpreted with this fact in mind. . This test was performed using  the Siemens  chemiluminescent method. Values obtained from  different assay methods cannot be used interchangeably. PSA levels, regardless of value, should not be interpreted as absolute evidence of the presence or absence of disease.   11/12/2014 2.0 0.0 - 4.0 ng/mL Final    Comment:    Roche ECLIA methodology. According to the American Urological Association, Serum PSA should decrease and remain at undetectable levels after radical prostatectomy. The AUA defines biochemical recurrence as an initial PSA value 0.2 ng/mL or greater followed by a subsequent confirmatory PSA  value 0.2 ng/mL or greater. Values obtained with different assay methods or kits cannot be used interchangeably. Results cannot be interpreted as absolute evidence of the presence or absence of malignant disease.    No results found for: TESTOSTERONE    Studies/Results: CT ABDOMEN PELVIS W CONTRAST  Result Date: 07/29/2020 CLINICAL DATA:  Recently diagnosed high-risk prostate carcinoma. Staging. Prior history of necrotizing pancreatitis. EXAM: CT ABDOMEN AND PELVIS WITH CONTRAST TECHNIQUE: Multidetector CT imaging of the abdomen and pelvis was performed using the standard protocol following bolus administration of intravenous contrast. CONTRAST:  173m OMNIPAQUE IOHEXOL 300 MG/ML  SOLN COMPARISON:  Abdomen only CT on 07/12/2016 FINDINGS: Lower Chest: No acute findings. Hepatobiliary: No hepatic masses identified. Prior cholecystectomy again noted. No evidence biliary ductal dilatation. Pancreas: Atrophy of the pancreatic body tail is stable in appearance. Enlargement of the pancreatic head is also unchanged, however there is been interval resolution a peripancreatic fluid collection posterior to the pancreatic head since previous study, consistent with resolving pseudocyst. A new well circumscribed fluid collection is seen in the pancreaticoduodenal groove which measures 2.6 x 2.0 cm on image 36/2, consistent with a new pseudocyst. No evidence of acute peripancreatic inflammatory changes or mass. 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 Numerous portosystemic collaterals are seen in the gastrohepatic and gastrosplenic ligaments, and prominent gastric varices are also noted. Spleen: Within normal limits in size and appearance. Adrenals/Urinary Tract: No masses identified. No evidence of ureteral calculi or hydronephrosis. Stomach/Bowel: No evidence of obstruction, inflammatory process or abnormal fluid collections. Diverticulosis is seen  mainly involving the descending and sigmoid colon, however there is no evidence of diverticulitis. A large right inguinal hernia is seen containing multiple distal small bowel loops and the proximal ascending colon. No evidence of bowel obstruction or strangulation. Vascular/Lymphatic: No pathologically enlarged lymph nodes. No abdominal aortic aneurysm. Aortic atherosclerosis noted. Reproductive:  Mildly enlarged prostate. Other:  None. Musculoskeletal:  No suspicious bone lesions identified. IMPRESSION: Mildly enlarged prostate. No evidence of abdominal or pelvic metastatic disease. Very large right inguinal hernia containing multiple distal small bowel loops and proximal ascending colon. No evidence of bowel obstruction or strangulation. Colonic diverticulosis, without radiographic evidence of diverticulitis. 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 Aortic Atherosclerosis (ICD10-I70.0). Electronically Signed   By: JMarlaine HindM.D.   On: 07/29/2020 15:32   NUCLEAR MEDICINE WHOLE BODY BONE SCAN  TECHNIQUE: Whole body anterior and posterior images were obtained approximately 3 hours after intravenous injection of radiopharmaceutical.  RADIOPHARMACEUTICALS:  19.53 mCi Technetium-929mDP IV  COMPARISON:  July 12, 2016.  FINDINGS: No suspicious focal radiotracer uptake.  There is radiotracer uptake within bilateral mid feet, knees, the RIGHT greater than LEFT elbow, the sternoclavicular joints and bilateral shoulders. This is favored to be degenerative in etiology. Expected physiologic distribution of radiotracer.  IMPRESSION: No scintigraphic evidence  of osseous metastatic disease.   Electronically Signed   By: Valentino Saxon MD   On: 07/07/2020 08:17   Assessment & Plan: Prostate cancer.  He has progressed and has T2b N0 M0  Gleason 7(4+3) disease with a  PSA that is rising on finasteride and right prostate induration.    I discussed options for therapy including continued surveillance and radiation therapy.  He is not a candidate for prostatectomy.  I will get him set up to see radiation oncology for a consultation. I think he would be appropriate for a seed implant.   He and his wife are not sure that he would like to proceed with active therapy which is understandable at his age with his comorbidities.  I will schedule a 3 month f/u with a PSA should they decide to continue surveillance.   Nodular prostate with obstruction.  He will continue the finasteride.    No orders of the defined types were placed in this encounter.    Orders Placed This Encounter  Procedures  . Urinalysis, Routine w reflex microscopic  . PSA    Standing Status:   Future    Standing Expiration Date:   11/29/2020  . Ambulatory referral to Radiation Oncology    Referral Priority:   Routine    Referral Type:   Consultation    Referral Reason:   Specialty Services Required    Referred to Provider:   Tyler Pita, MD    Requested Specialty:   Radiation Oncology    Number of Visits Requested:   1      Return in about 3 months (around 10/30/2020) for with PSA.   CC: Dettinger, Fransisca Kaufmann, MD      Irine Seal 07/30/2020

## 2020-07-30 ENCOUNTER — Encounter: Payer: Self-pay | Admitting: Urology

## 2020-07-30 ENCOUNTER — Ambulatory Visit (INDEPENDENT_AMBULATORY_CARE_PROVIDER_SITE_OTHER): Payer: Medicare Other | Admitting: Urology

## 2020-07-30 VITALS — BP 167/70 | HR 58 | Temp 98.2°F | Ht 65.0 in | Wt 167.0 lb

## 2020-07-30 DIAGNOSIS — N403 Nodular prostate with lower urinary tract symptoms: Secondary | ICD-10-CM | POA: Diagnosis not present

## 2020-07-30 DIAGNOSIS — R9721 Rising PSA following treatment for malignant neoplasm of prostate: Secondary | ICD-10-CM

## 2020-07-30 DIAGNOSIS — N138 Other obstructive and reflux uropathy: Secondary | ICD-10-CM | POA: Diagnosis not present

## 2020-07-30 DIAGNOSIS — C61 Malignant neoplasm of prostate: Secondary | ICD-10-CM | POA: Diagnosis not present

## 2020-07-30 DIAGNOSIS — R351 Nocturia: Secondary | ICD-10-CM | POA: Diagnosis not present

## 2020-07-30 LAB — URINALYSIS, ROUTINE W REFLEX MICROSCOPIC
Bilirubin, UA: NEGATIVE
Glucose, UA: NEGATIVE
Leukocytes,UA: NEGATIVE
Nitrite, UA: NEGATIVE
Protein,UA: NEGATIVE
RBC, UA: NEGATIVE
Specific Gravity, UA: 1.025 (ref 1.005–1.030)
Urobilinogen, Ur: 0.2 mg/dL (ref 0.2–1.0)
pH, UA: 5.5 (ref 5.0–7.5)

## 2020-08-12 ENCOUNTER — Other Ambulatory Visit: Payer: Self-pay | Admitting: Family Medicine

## 2020-08-12 DIAGNOSIS — E1169 Type 2 diabetes mellitus with other specified complication: Secondary | ICD-10-CM

## 2020-08-13 ENCOUNTER — Other Ambulatory Visit: Payer: Self-pay | Admitting: Family Medicine

## 2020-08-13 DIAGNOSIS — E1169 Type 2 diabetes mellitus with other specified complication: Secondary | ICD-10-CM

## 2020-08-17 ENCOUNTER — Other Ambulatory Visit: Payer: Self-pay | Admitting: Family Medicine

## 2020-08-17 DIAGNOSIS — I152 Hypertension secondary to endocrine disorders: Secondary | ICD-10-CM

## 2020-08-25 ENCOUNTER — Other Ambulatory Visit: Payer: Self-pay

## 2020-08-25 ENCOUNTER — Ambulatory Visit (INDEPENDENT_AMBULATORY_CARE_PROVIDER_SITE_OTHER): Payer: Medicare Other | Admitting: Family Medicine

## 2020-08-25 ENCOUNTER — Encounter: Payer: Self-pay | Admitting: Family Medicine

## 2020-08-25 VITALS — BP 153/57 | HR 53 | Temp 97.0°F | Ht 65.0 in | Wt 168.0 lb

## 2020-08-25 DIAGNOSIS — Z7901 Long term (current) use of anticoagulants: Secondary | ICD-10-CM

## 2020-08-25 DIAGNOSIS — Z86718 Personal history of other venous thrombosis and embolism: Secondary | ICD-10-CM | POA: Diagnosis not present

## 2020-08-25 DIAGNOSIS — D6859 Other primary thrombophilia: Secondary | ICD-10-CM

## 2020-08-25 LAB — COAGUCHEK XS/INR WAIVED
INR: 1.8 — ABNORMAL HIGH (ref 0.9–1.1)
Prothrombin Time: 21.4 s

## 2020-08-25 NOTE — Progress Notes (Signed)
BP (!) 153/57    Pulse (!) 53    Temp (!) 97 F (36.1 C)    Ht 5' 5" (1.651 m)    Wt 76.2 kg    SpO2 100%    BMI 27.96 kg/m    Subjective:   Patient ID: Alan Netters., male    DOB: 09/27/1941, 79 y.o.   MRN: [id number]  HPI: Alan Wendt. is a 79 y.o. male presenting on 08/25/2020 for Medical Management of Chronic Issues and Atrial Fibrillation   HPI  Coumadin recheck Target goal: 2.0-3.0 Reason on anticoagulation: Paroxysmal A. fib and history of DVT Today INR: 1.8 (08/25/2020)- Patient state he stopped taking his Warfarin on Sunday because he received a letter in the main regarding his colonoscopy appointment which is scheduled for 08/31/2020. He was not told to stop warfarin and letter he brought with him shows that this appointment is likely an initial visit and not the procedure.Patient denies any bruising or bleeding  chest pain or palpitations.  Patient is doing well and denies any major bleeding.  Hypertension: Patient's blood pressure is 155/62 and 153/57 today. Patient states he is taking his Lisinopril and metoprolol to treat. He states this pressures are usually in this range but he is unable to check at home due to his cuff being broken. He denies any chest pain, or headache.     Relevant past medical, surgical, family and social history reviewed and updated as indicated. Interim medical history since our last visit reviewed. Allergies and medications reviewed and updated.  Review of Systems  Constitutional: Negative for chills, fatigue and fever.  Cardiovascular: Negative for chest pain and palpitations.  Gastrointestinal: Negative for abdominal pain, nausea and vomiting.  Genitourinary: Negative for hematuria.  Neurological: Negative for headaches.    Per HPI unless specifically indicated above   Allergies as of 08/25/2020      Reactions   Allopurinol Other (See Comments)   Significantly decreased WBC's   Colchicine Other (See Comments)   Significantly  decreased WBC's      Medication List       Accurate as of August 25, 2020 11:04 AM. If you have any questions, ask your nurse or doctor.        amLODipine 10 MG tablet Commonly known as: NORVASC TAKE 1 TABLET BY MOUTH ONCE DAILY IN THE EVENING   chlorthalidone 25 MG tablet Commonly known as: HYGROTON Take 1 tablet (25 mg total) by mouth daily.   finasteride 5 MG tablet Commonly known as: PROSCAR Take 1 tablet (5 mg total) by mouth daily.   fluticasone 50 MCG/ACT nasal spray Commonly known as: FLONASE Place 1 spray into both nostrils 2 (two) times daily as needed for allergies or rhinitis.   glucose blood test strip Commonly known as: ONE TOUCH ULTRA TEST Use to check BG once daily.  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 Take 1 hour prior to prostate biopsy.   lisinopril 20 MG tablet Commonly known as: ZESTRIL Take 1.5 tablets (30 mg total) by mouth daily. Please make yearly appt with Dr. Tamala Julian for March before anymore refills. 1st attempt   metFORMIN 1000 MG tablet Commonly known as: GLUCOPHAGE TAKE 1 TABLET BY MOUTH TWICE DAILY WITH A MEAL   metoprolol succinate 100 MG  24 hr tablet Commonly known as: TOPROL-XL TAKE 1 TABLET BY MOUTH ONCE DAILY. TAKE WITH OR IMMEDIATELY FOLLOWING A MEAL   omeprazole 20 MG capsule Commonly known as: PRILOSEC Take 1 capsule by mouth once daily   ONE TOUCH ULTRA MINI w/Device Kit 1 each by Does not apply route 2 (two) times daily.   potassium chloride SA 20 MEQ tablet Commonly known as: KLOR-CON Take 1 tablet by mouth once daily   pravastatin 40 MG tablet Commonly known as: PRAVACHOL TAKE 1 TABLET BY MOUTH ONCE DAILY IN THE EVENING   warfarin 5 MG tablet Commonly known as: COUMADIN Take as directed by the anticoagulation clinic. If you are unsure  how to take this medication, talk to your nurse or doctor. Original instructions: TAKE 1.5 (ONE & ONE-HALF) TABLETS BY MOUTH ONCE DAILY AT 6 IN THE EVENING        Objective:   BP (!) 153/57    Pulse (!) 53    Temp (!) 97 F (36.1 C)    Ht 5' 5" (1.651 m)    Wt 76.2 kg    SpO2 100%    BMI 27.96 kg/m   Wt Readings from Last 3 Encounters:  08/25/20 76.2 kg  07/30/20 75.8 kg  07/14/20 75.8 kg    Physical Exam Constitutional:      General: He is not in acute distress.    Appearance: Normal appearance.  Cardiovascular:     Rate and Rhythm: Rhythm irregular.  Pulmonary:     Effort: Pulmonary effort is normal.     Breath sounds: Normal breath sounds.  Abdominal:     General: There is no distension.     Palpations: Abdomen is soft.  Musculoskeletal:     Cervical back: Neck supple.  Neurological:     Mental Status: He is alert.       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 Restart Warfarin as scheduled from last visit.   2. Continue taking Hypertensive medications as prescribed. Patient's pressures are comparable to other visits.  Plan was discussed with Dr. Caryl Pina, MD who is in agreement with stated plan. Description   Patient had stopped his Coumadin 3 days ago and is likely why he is low, just restarted at current dose.  Continue current dose to take 1 tablet every day of the week except for Mondays, Wednesdays and Saturdays take 1 1/2 tablets INR was 1.8 (goal 2-3)   Follow-up 4 weeks      Maxie Better PA-S   Follow up plan: Return if symptoms worsen or fail to improve, for 4 to 6-week  INR recheck.   Counseling provided for all of the vaccine components No orders of the defined types were placed in this encounter.  Patient seen and examined with Maxie Better, PA student, agree with assessment and plan above.  Patient had stopped his Coumadin because he thought he was having a procedure but will restart it because it is just an office visit, we will have him discuss any further with [place] once he knows what he is  actually having his procedure.  Resume normal home dose  Caryl Pina, MD [facility] 08/25/2020, 2:02 PM

## 2020-08-26 ENCOUNTER — Other Ambulatory Visit: Payer: Self-pay | Admitting: Family Medicine

## 2020-08-26 ENCOUNTER — Encounter: Payer: Self-pay | Admitting: Radiation Oncology

## 2020-08-26 DIAGNOSIS — D6859 Other primary thrombophilia: Secondary | ICD-10-CM

## 2020-08-26 NOTE — Progress Notes (Addendum)
GU Location of Tumor / Histology: prostatic adenocarcinoma  If Prostate Cancer, Gleason Score is (4 + 3) and PSA is (6.4). Prostate volume: 46 mL.  Alan [name]. was diagnosed August 2009 with low grade prostate cancer and opted for active surveillance.   Biopsies of prostate (if applicable) revealed:    Past/Anticipated interventions by urology, if any: prostate biopsy, active surveillance, prescribed finasteride, referred for consideration of seeds once progression was noted on 06/25/2020 repeat biopsy  Past/Anticipated interventions by medical oncology, if any: no  Weight changes, if any: no  Bowel/Bladder complaints, if any: occasional  Nausea/Vomiting, if any: no  Pain issues, if any:  none  SAFETY ISSUES:  Prior radiation? none  Pacemaker/ICD? none  Possible current pregnancy? no, male patient  Is the patient on methotrexate? none  Current Complaints / other details:  79 year old male. Patient in with his wife and son to discuss radiation treatment for prostate cancer. Questions and concerns addressed. AUA 12 has had covid vaccine

## 2020-08-27 ENCOUNTER — Ambulatory Visit
Admission: RE | Admit: 2020-08-27 | Discharge: 2020-08-27 | Disposition: A | Discharge status: Home / Self Care | Payer: Medicare Other | Source: Ambulatory Visit | Attending: Radiation Oncology | Admitting: Radiation Oncology

## 2020-08-27 ENCOUNTER — Encounter: Payer: Self-pay | Admitting: Radiation Oncology

## 2020-08-27 ENCOUNTER — Other Ambulatory Visit: Payer: Self-pay

## 2020-08-27 VITALS — BP 158/68 | HR 65 | Temp 97.9°F | Resp 20 | Ht 65.0 in | Wt 170.6 lb

## 2020-08-27 DIAGNOSIS — C61 Malignant neoplasm of prostate: Secondary | ICD-10-CM

## 2020-08-27 DIAGNOSIS — Z808 Family history of malignant neoplasm of other organs or systems: Secondary | ICD-10-CM | POA: Diagnosis not present

## 2020-08-27 DIAGNOSIS — Z79899 Other long term (current) drug therapy: Secondary | ICD-10-CM | POA: Diagnosis not present

## 2020-08-27 NOTE — Progress Notes (Signed)
Radiation Oncology         [phone]) [phone] ________________________________  Initial Outpatient Consultation  Name: Alan [name]. MRN: [id number]  Date: 08/27/2020  DOB: 09-20-1941  BZ:JIRCVELFY, Fransisca Kaufmann, MD  Irine Seal, MD   REFERRING PHYSICIAN: Irine Seal, MD  DIAGNOSIS: 79 y.o. 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    ICD-10-CM   1. Prostate cancer [facility])  C61     HISTORY OF PRESENT ILLNESS: Alan [name]. is a 79 y.o. male with a diagnosis of prostate cancer. He was initially diagnosed by Dr. Jeffie Pollock on 08/08/2008 with Gleason 3+3 in 5% of one core. He had undergone surveillance biopsies in 12/2013 and 06/2018 that showed disease stability with <5% of Gleason 6 in one core and atypia, respectively. He has remained on finasteride since 2015.  More recently, his PSA has continued to climb despite the finasteride. His PSA reached 4 on 05/2019 and was most recently 6.4 (12.8 adjusted for finasteride) in 05/2020. Digital rectal examination from follow up on 05/28/2020 showed some right-sided prostate induration. This prompted a repeat prostate biopsy on 06/25/2020. Prostate volume measured 46 cc. Out of 12 core biopsies, 6 were positive, including all apex cores. Pathology revealed upstaging with a maximum Gleason score of 4+3, which was seen in small foci in the right mid and right apex. Additionally, Gleason 3+3 was seen in the right apex lateral, left apex, left apex lateral (small focus), and left mid lateral (small focus).  He underwent bone scan on 07/06/2020 showing no evidence of osseous metastatic disease. He also underwent CT A/P on 07/29/2020 showing no evidence of visceral metastatic disease.  The patient reviewed the biopsy results with his urologist and he has kindly been referred today for discussion of potential radiation treatment options.   PREVIOUS RADIATION THERAPY: No  PAST MEDICAL HISTORY:   Past Medical History:  Diagnosis Date  . Adenomatous colon polyp   . Agranulocytosis ([place])   . AKI (acute kidney injury) ([place]) 09/22/2019  . Allergy   . Atrial fibrillation ([place]) 04/19/2016  . Cataract    small  . Diabetes mellitus   . Diverticulosis   . ED (erectile dysfunction)   . 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 Gallstones   . GERD (gastroesophageal reflux disease)   . Gout   . Heart murmur   . Hyperlipidemia   . Hypertension   . Internal hemorrhoids   . Pancreatitis   . 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  . Prostate cancer ([place])    Followed by Dr. Jeffie Pollock       PAST SURGICAL HISTORY: Past Surgical History:  Procedure Laterality Date  . CHOLECYSTECTOMY N/A 11/26/2015   Procedure: LAPAROSCOPIC CHOLECYSTECTOMY;  Surgeon: Aviva Signs, MD;  Location: [facility] ORS;  Service: General;  Laterality: N/A;  . COLONOSCOPY  2012   [name]: moderate sigmoid diverticulosis, internal hemorrhoids. next tcs 2017  . FLEXIBLE BRONCHOSCOPY Bilateral 04/14/2016   Procedure: FLEXIBLE BRONCHOSCOPY;  Surgeon: Sinda Du, MD;  Location: [facility] ENDO SUITE;  Service: Cardiopulmonary;  Laterality: Bilateral;  . SIGMOIDOSCOPY    . UMBILICAL HERNIA REPAIR N/A 11/26/2015   Procedure: UMBILICAL HERNIORRHAPHY;  Surgeon: Aviva Signs, MD;  Location: [facility] ORS;  Service: General;  Laterality: N/A;    FAMILY HISTORY:  Family History  Problem Relation Age of Onset  . Stroke Mother   . Hypertension Mother   . Alzheimer's disease Mother   .  Hypertension Father   . Brain cancer Sister   . Breast cancer Sister   . Hypertension Sister   . Deep vein thrombosis Brother   . Hypertension Brother   . Colon cancer Neg Hx   . Pancreatic disease Neg Hx     SOCIAL HISTORY:  Social History   Socioeconomic History  . Marital status: Married    Spouse name: Alan [name]  . Number of children: 4  . Years of education: Not on file  . Highest education level: 10th grade   Occupational History  . Occupation: Retired     Fish farm manager: [employer]  Tobacco Use  . Smoking status: Former Smoker    Types: Cigarettes    Quit date: 12/11/1986    Years since quitting: 33.7  . Smokeless tobacco: Never Used  . Tobacco comment: Quit x 50 years  Vaping Use  . Vaping Use: Never used  Substance and Sexual Activity  . Alcohol use: No  . Drug use: No  . Sexual activity: Yes  Other Topics Concern  . Not on file  Social History Narrative  . Not on file   Social Determinants of Health   Financial Resource Strain:   . Difficulty of Paying Living Expenses: Not on file  Food Insecurity:   . Worried About Charity fundraiser in the Last Year: Not on file  . Ran Out of Food in the Last Year: Not on file  Transportation Needs:   . Lack of Transportation (Medical): Not on file  . Lack of Transportation (Non-Medical): Not on file  Physical Activity:   . Days of Exercise per Week: Not on file  . Minutes of Exercise per Session: Not on file  Stress:   . Feeling of Stress : Not on file  Social Connections:   . Frequency of Communication with Friends and Family: Not on file  . Frequency of Social Gatherings with Friends and Family: Not on file  . Attends Religious Services: Not on file  . Active Member of Clubs or Organizations: Not on file  . Attends Archivist Meetings: Not on file  . Marital Status: Not on file  Intimate Partner Violence:   . Fear of Current or Ex-Partner: Not on file  . Emotionally Abused: Not on file  . Physically Abused: Not on file  . Sexually Abused: Not on file    ALLERGIES: Allopurinol and Colchicine  MEDICATIONS:  Current Outpatient Medications  Medication Sig Dispense Refill  . amLODipine (NORVASC) 10 MG tablet TAKE 1 TABLET BY MOUTH ONCE DAILY IN THE EVENING 90 tablet 0  . Blood Glucose Monitoring Suppl (ONE TOUCH ULTRA MINI) w/Device KIT 1 each by Does not apply route 2 (two) times daily. 1 kit 1  . chlorthalidone (HYGROTON)  25 MG tablet Take 1 tablet (25 mg total) by mouth daily. 90 tablet 3  . Ferrous Sulfate (IRON) 325 (65 Fe) MG TABS Take 1 tablet by mouth once daily with breakfast 90 tablet 0  . finasteride (PROSCAR) 5 MG tablet Take 1 tablet (5 mg total) by mouth daily. 90 tablet 3  . fluticasone (FLONASE) 50 MCG/ACT nasal spray Place 1 spray into both nostrils 2 (two) times daily as needed for allergies or rhinitis. 16 g 6  . glucose blood (ONE TOUCH ULTRA TEST) test strip Use to check BG once daily.  Dx:  Type 2 DM controlled E11.9 100 each 4  . lisinopril (ZESTRIL) 20 MG tablet Take 1.5 tablets (30 mg total) by mouth  daily. Please make yearly appt with Dr. Tamala Julian for March before anymore refills. 1st attempt 135 tablet 3  . metFORMIN (GLUCOPHAGE) 1000 MG tablet TAKE 1 TABLET BY MOUTH TWICE DAILY WITH A MEAL 180 tablet 0  . metoprolol succinate (TOPROL-XL) 100 MG 24 hr tablet TAKE 1 TABLET BY MOUTH ONCE DAILY. TAKE WITH OR IMMEDIATELY FOLLOWING A MEAL 90 tablet 0  . omeprazole (PRILOSEC) 20 MG capsule Take 1 capsule by mouth once daily 90 capsule 0  . potassium chloride SA (KLOR-CON) 20 MEQ tablet Take 1 tablet by mouth once daily 90 tablet 0  . pravastatin (PRAVACHOL) 40 MG tablet TAKE 1 TABLET BY MOUTH ONCE DAILY IN THE EVENING 90 tablet 0  . warfarin (COUMADIN) 5 MG tablet TAKE 1 & 1/2 (ONE & ONE-HALF) TABLETS BY MOUTH ONCE DAILY AT  6  PM 90 tablet 0   No current facility-administered medications for this encounter.    REVIEW OF SYSTEMS:  On review of systems, the patient reports that he is doing well overall. He denies any chest pain, shortness of breath, cough, fevers, chills, night sweats, unintended weight changes. He denies any bowel disturbances, and denies abdominal pain, nausea or vomiting. He denies any new musculoskeletal or joint aches or pains. His IPSS was 9, indicating mild urinary symptoms with frequency, urgency and nocturia x2 per night. His SHIM was 1, indicating he has severe erectile  dysfunction which is not a priority since he is not currently sexually active. A complete review of systems is obtained and is otherwise negative.  PHYSICAL EXAM:  Wt Readings from Last 3 Encounters:  08/27/20 170 lb 9.6 oz (77.4 kg)  08/25/20 168 lb (76.2 kg)  07/30/20 167 lb (75.8 kg)   Temp Readings from Last 3 Encounters:  08/27/20 97.9 F (36.6 C)  08/25/20 (!) 97 F (36.1 C)  07/30/20 98.2 F (36.8 C)   BP Readings from Last 3 Encounters:  08/27/20 (!) 158/68  08/25/20 (!) 153/57  07/30/20 (!) 167/70   Pulse Readings from Last 3 Encounters:  08/27/20 65  08/25/20 (!) 53  07/30/20 (!) 58   Pain Assessment Pain Score: 0-No pain/10  In general this is a well appearing African-American male in no acute distress. He is alert and oriented x4 and appropriate throughout the examination. HEENT reveals that the patient is normocephalic, atraumatic. EOMs are intact. PERRLA. Skin is intact without any evidence of gross lesions. Cardiopulmonary assessment is negative for acute distress and he exhibits normal effort. The abdomen is soft, non tender, non distended. Lower extremities are negative for pretibial pitting edema, deep calf tenderness, cyanosis or clubbing.   KPS = 90  100 - Normal; no complaints; no evidence of disease. 90   - Able to carry on normal activity; minor signs or symptoms of disease. 80   - Normal activity with effort; some signs or symptoms of disease. 20   - Cares for self; unable to carry on normal activity or to do active work. 60   - Requires occasional assistance, but is able to care for most of his personal needs. 50   - Requires considerable assistance and frequent medical care. 2   - Disabled; requires special care and assistance. 61   - Severely disabled; hospital admission is indicated although death not imminent. 32   - Very sick; hospital admission necessary; active supportive treatment necessary. 10   - Moribund; fatal processes progressing  rapidly. 0     - Dead  Karnofsky DA, [place],  Craver LS and Burchenal JH (1948) The use of the nitrogen mustards in the palliative treatment of carcinoma: with particular reference to bronchogenic carcinoma Cancer 1 634-56  LABORATORY DATA:  Lab Results  Component Value Date   WBC 5.6 06/16/2020   HGB 11.3 (L) 06/16/2020   HCT 35.2 (L) 06/16/2020   MCV 92 06/16/2020   PLT 249 06/16/2020   Lab Results  Component Value Date   NA 139 06/16/2020   K 3.8 06/16/2020   CL 100 06/16/2020   CO2 26 06/16/2020   Lab Results  Component Value Date   ALT 14 06/16/2020   AST 22 06/16/2020   ALKPHOS 69 06/16/2020   BILITOT 0.6 06/16/2020     RADIOGRAPHY: No results found.    IMPRESSION/PLAN:  1. 79 y.o. gentleman with Stage T2b adenocarcinoma of the prostate with Gleason Score of 4+3, and PSA of 6.4 on finasteride. We discussed the patient's workup and outlined the nature of prostate cancer in this setting. The patient's T stage, Gleason's score, and PSA put him into the unfavorable intermediate risk group. Accordingly, he is eligible for a variety of potential treatment options including brachytherapy, 5.5 weeks of external radiation, or prostatectomy. We discussed the available radiation techniques, and focused on the details and logistics of delivery. We discussed and outlined the risks, benefits, short and long-term effects associated with radiotherapy and compared and contrasted these with prostatectomy. We discussed the role of SpaceOAR in reducing the rectal toxicity associated with radiotherapy.  The patient and his wife were encouraged to ask questions were answered to their stated satisfaction.  They appear to have a good understanding of his disease and our treatment recommendations which are of curative intent.  At the end of the conversation, the patient is interested in moving forward with brachytherapy and use of SpaceOAR gel to reduce rectal toxicity from radiotherapy.  We will  share our discussion with Dr. Jeffie Pollock and move forward with scheduling his CT [facility] planning appointment in the near future.  The patient will be contacted by Romie Jumper in our office who will be working closely with him to coordinate OR scheduling and pre and post procedure appointments.  We will contact the pharmaceutical rep to ensure that Sidney is available at the time of procedure.  We enjoyed meeting him today and look forward to continuing to participate in his care.    Nicholos Johns, PA-C    Tyler Pita, MD  [facility] Direct Dial: [phone]  Fax: [phone] [email or web address]  Skype  [email or web address]  This document serves as a record of services personally performed by Tyler Pita, MD and Freeman Caldron, PA-C. It was created on their behalf by Wilburn Mylar, a trained medical scribe. The creation of this record is based on the scribe's personal observations and the provider's statements to them. This document has been checked and approved by the attending provider.

## 2020-08-27 NOTE — Patient Instructions (Signed)
Coronavirus (COVID-19) Are you at risk?  Are you at risk for the Coronavirus (COVID-19)?  To be considered HIGH RISK for Coronavirus (COVID-19), you have to meet the following criteria:  . Traveled to [place], [place], [place], [place] or [place]; or in the [place] to [place], [place], [place], or [place]; and have fever, cough, and shortness of breath within the last 2 weeks of travel OR . Been in close contact with a person diagnosed with COVID-19 within the last 2 weeks and have fever, cough, and shortness of breath . IF YOU DO NOT MEET THESE CRITERIA, YOU ARE CONSIDERED LOW RISK FOR COVID-19.  What to do if you are HIGH RISK for COVID-19?  . If you are having a medical emergency, call 911. . Seek medical care right away. Before you go to a doctor's office, urgent care or emergency department, call ahead and tell them about your recent travel, contact with someone diagnosed with COVID-19, and your symptoms. You should receive instructions from your physician's office regarding next steps of care.  . When you arrive at healthcare provider, tell the healthcare staff immediately you have returned from visiting [place], [place], [place], [place] or [place]; or traveled in the [place] to [place], [place], [place], or [place]; in the last two weeks or you have been in close contact with a person diagnosed with COVID-19 in the last 2 weeks.   . Tell the health care staff about your symptoms: fever, cough and shortness of breath. . After you have been seen by a medical provider, you will be either: o Tested for (COVID-19) and discharged home on quarantine except to seek medical care if symptoms worsen, and asked to  - Stay home and avoid contact with others until you get your results (4-5 days)  - Avoid travel on public transportation if possible (such as bus, train, or airplane) or o Sent to the Emergency Department by EMS for evaluation, COVID-19 testing, and possible  admission depending on your condition and test results.  What to do if you are LOW RISK for COVID-19?  Reduce your risk of any infection by using the same precautions used for avoiding the common cold or flu:  . Wash your hands often with soap and warm water for at least 20 seconds.  If soap and water are not readily available, use an alcohol-based hand sanitizer with at least 60% alcohol.  . If coughing or sneezing, cover your mouth and nose by coughing or sneezing into the elbow areas of your shirt or coat, into a tissue or into your sleeve (not your hands). . Avoid shaking hands with others and consider head nods or verbal greetings only. . Avoid touching your eyes, nose, or mouth with unwashed hands.  . Avoid close contact with people who are sick. . Avoid places or events with large numbers of people in one location, like concerts or sporting events. . Carefully consider travel plans you have or are making. . If you are planning any travel outside or inside the [place], visit the CDC's Travelers' Health webpage for the latest health notices. . If you have some symptoms but not all symptoms, continue to monitor at home and seek medical attention if your symptoms worsen. . If you are having a medical emergency, call 911.   ADDITIONAL HEALTHCARE OPTIONS FOR PATIENTS  [facility] / e-Visit: [email or web address]         [facility]: [phone]  [place]   [facility]: [phone]                   [facility]: [phone]   

## 2020-08-30 ENCOUNTER — Telehealth: Payer: Self-pay | Admitting: *Deleted

## 2020-08-30 ENCOUNTER — Other Ambulatory Visit: Payer: Self-pay

## 2020-08-30 NOTE — Telephone Encounter (Signed)
CALLED PATIENT TO ASK QUESTIONS, SPOKE WITH PATIENT 

## 2020-08-31 ENCOUNTER — Ambulatory Visit: Payer: Medicare Other | Admitting: Gastroenterology

## 2020-08-31 ENCOUNTER — Telehealth: Payer: Self-pay

## 2020-08-31 ENCOUNTER — Ambulatory Visit (INDEPENDENT_AMBULATORY_CARE_PROVIDER_SITE_OTHER): Payer: Medicare Other | Admitting: Licensed Clinical Social Worker

## 2020-08-31 ENCOUNTER — Encounter: Payer: Self-pay | Admitting: Gastroenterology

## 2020-08-31 VITALS — BP 136/56 | HR 54 | Ht 65.0 in | Wt 172.2 lb

## 2020-08-31 DIAGNOSIS — C61 Malignant neoplasm of prostate: Secondary | ICD-10-CM

## 2020-08-31 DIAGNOSIS — E1169 Type 2 diabetes mellitus with other specified complication: Secondary | ICD-10-CM

## 2020-08-31 DIAGNOSIS — I480 Paroxysmal atrial fibrillation: Secondary | ICD-10-CM

## 2020-08-31 DIAGNOSIS — I10 Essential (primary) hypertension: Secondary | ICD-10-CM

## 2020-08-31 DIAGNOSIS — E785 Hyperlipidemia, unspecified: Secondary | ICD-10-CM

## 2020-08-31 DIAGNOSIS — K219 Gastro-esophageal reflux disease without esophagitis: Secondary | ICD-10-CM

## 2020-08-31 DIAGNOSIS — Z1211 Encounter for screening for malignant neoplasm of colon: Secondary | ICD-10-CM

## 2020-08-31 DIAGNOSIS — Z7901 Long term (current) use of anticoagulants: Secondary | ICD-10-CM

## 2020-08-31 DIAGNOSIS — Z86718 Personal history of other venous thrombosis and embolism: Secondary | ICD-10-CM

## 2020-08-31 DIAGNOSIS — E1159 Type 2 diabetes mellitus with other circulatory complications: Secondary | ICD-10-CM

## 2020-08-31 DIAGNOSIS — M109 Gout, unspecified: Secondary | ICD-10-CM

## 2020-08-31 MED ORDER — SUTAB 1479-225-188 MG PO TABS
1.0000 | ORAL_TABLET | ORAL | 0 refills | Status: DC
Start: 2020-08-31 — End: 2020-09-06

## 2020-08-31 NOTE — Patient Instructions (Addendum)
Licensed Clinical Education officer, museum Visit Information  Goals we discussed today:    Client will talk with LCSW in next 30 days about health needs of clinet and client completion of daily activities (pt-stated)        CARE PLAN ENTRY   Current Barriers:   Patient with chronic diagnoses of HLD, Gout, Prostate Cancer, GERD, HTN, Type 2 DM, Atrial Fibrillation, Hx DVT  Clinical Social Work Clinical Goal(s):   LCSW will call client in next 30 days to discuss health needs of client and to discuss client completion of daily activites  Interventions:  Talked with Alan Go about pain issues of client Talked with Alan Go about appetite of client Talked with Alan Go about ambulation needs of client Talked with Alan Go about social support of client(support from son and grandchildren) Talked with Alan Go about upcoming medical appointments for client Talked with Alan Go about client completion of ADLs Talked with Alan Go about medication procurement of client Talked with Alan Go about client relaxation techniques (watches TV, repairing items at home, enjoys being outdoors, caring for yard) Talked with Alan Go about vision of client (client has ordered new glasses) Talked with Alan Go about health needs of client Discussed with [facility] CCM program support . Encouraged client or Alan Go to call RNCM as needed to discuss nursing needs of client Talked with Alan Go about client upcoming medical procedure on September 30, 2020. Talked with Alan Go about transport needs of client Talked with Alan Go about mood of client Talked with Alan Go about sleeping issues of client Talked with Alan Go about client challenges in standing from a sitting position Talked with Alan Go about client's appointment today with Gastroenterologist  Patient Self Care Activities:   Completes ADLs independently Drives to needed appointments   Patient Self Care Deficits:   Slight hearing deficits  Initial goal documentation    Follow Up  Plan: LCSW to call client or Alan [name], spouse of client, in next 4 weeks to discuss health needs of client and to discuss client completion of daily activities  Materials Provided: No  The patient Alan [name], spouse of client,verbalized understanding of instructions provided today and declined a print copy of patient instruction materials.   Norva Riffle.Alan [name] MSW, LCSW Licensed Clinical Social Worker [facility] [phone]

## 2020-08-31 NOTE — Patient Instructions (Signed)
If you are age 77 or older, your body mass index should be between 23-30. Your Body mass index is 28.66 kg/m. If this is out of the aforementioned range listed, please consider follow up with your Primary Care Provider.  If you are age 39 or younger, your body mass index should be between 19-25. Your Body mass index is 28.66 kg/m. If this is out of the aformentioned range listed, please consider follow up with your Primary Care Provider.   You have been scheduled for a colonoscopy. Please follow written instructions given to you at your visit today.  Please pick up your prep supplies at the pharmacy within the next 1-3 days. If you use inhalers (even only as needed), please bring them with you on the day of your procedure.  You will be contaced by our office prior to your procedure for directions on holding your Coumadin/Warfarin.  If you do not hear from our office 1 week prior to your scheduled procedure, please call [phone] to discuss.  Follow up pending the results of your Colonoscopy.

## 2020-08-31 NOTE — Progress Notes (Signed)
Reviewed and agree with management plan.  Natalee Tomkiewicz T. Zeffie Bickert, MD FACG [facility]  

## 2020-08-31 NOTE — Progress Notes (Signed)
08/31/2020 Alan [name]. [id number] [birth year]   HISTORY OF PRESENT ILLNESS:  This is a 79 year old male with history of PAF on coumadin and followed by Dr. Tamala Julian, prostate cancer followed by Dr. Jeffie Pollock and is going to get seed implants next month, as well as other medical problems listed below.  His PCP referred him here to discuss colonoscopy for CRC screening.  His last colonoscopy was performed by Dr. Fuller Plan in 08/2011 at which time he had only diverticulosis and internal hemorrhoids.  He denies any GI complaints including rectal bleeding and abdominal pain.  He says that he moves his bowels well.  Past Medical History:  Diagnosis Date  . Adenomatous colon polyp   . Agranulocytosis ([place])   . AKI (acute kidney injury) ([place]) 09/22/2019  . Allergy   . Atrial fibrillation ([place]) 04/19/2016  . Cataract    small  . Diabetes mellitus   . Diverticulosis   . ED (erectile dysfunction)   . 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 Gallstones   . GERD (gastroesophageal reflux disease)   . Gout   . Heart murmur   . Hyperlipidemia   . Hypertension   . Internal hemorrhoids   . Pancreatitis   . 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  . Prostate cancer ([place])    Followed by Dr. Jeffie Pollock    Past Surgical History:  Procedure Laterality Date  . CHOLECYSTECTOMY N/A 11/26/2015   Procedure: LAPAROSCOPIC CHOLECYSTECTOMY;  Surgeon: Aviva Signs, MD;  Location: [facility];  Service: General;  Laterality: N/A;  . COLONOSCOPY  2012   [name]: moderate sigmoid diverticulosis, internal hemorrhoids. next tcs 2017  . FLEXIBLE BRONCHOSCOPY Bilateral 04/14/2016   Procedure: FLEXIBLE BRONCHOSCOPY;  Surgeon: Sinda Du, MD;  Location: [facility] END[facility] SUITE;  Service: Cardiopulmonary;  Laterality: Bilateral;  . SIGMOIDOSCOPY    . UMBILICAL HERNIA REPAIR N/A 11/26/2015   Procedure: UMBILICAL HERNIORRHAPHY;  Surgeon: Aviva Signs, MD;  Location: [facility] OR[facility];  Service: General;   Laterality: N/A;    reports that he quit smoking about 33 years ago. His smoking use included cigarettes. He has never used smokeless tobacco. He reports that he does not drink alcohol and does not use drugs. family history includes Alzheimer's disease in his mother; Brain cancer in his sister; Breast cancer in his sister; Deep vein thrombosis in his brother; Hypertension in his brother, father, mother, and sister; Stroke in his mother. Allergies  Allergen Reactions  . Allopurinol Other (See Comments)    Significantly decreased WBC's  . Colchicine Other (See Comments)    Significantly decreased WBC's      Outpatient Encounter Medications as of 08/31/2020  Medication Sig  . amLODipine (NORVASC) 10 MG tablet TAKE 1 TABLET BY MOUTH ONCE DAILY IN THE EVENING  . Blood Glucose Monitoring Suppl (ONE TOUCH ULTRA MINI) w/Device KIT 1 each by Does not apply route 2 (two) times daily.  . chlorthalidone (HYGROTON) 25 MG tablet Take 1 tablet (25 mg total) by mouth daily.  . Ferrous Sulfate (IRON) 325 (65 Fe) MG TABS Take 1 tablet by mouth once daily with breakfast  . finasteride (PROSCAR) 5 MG tablet Take 1 tablet (5 mg total) by mouth daily.  . fluticasone (FLONASE) 50 MCG/ACT nasal spray Place 1 spray into both nostrils 2 (two) times daily as needed for allergies or rhinitis.  Marland Kitchen glucose blood (ONE TOUCH ULTRA TEST) test strip Use to check BG once daily.  Dx:  Type 2 DM controlled  E11.9  . lisinopril (ZESTRIL) 20 MG tablet Take 1.5 tablets (30 mg total) by mouth daily. Please make yearly appt with Dr. Tamala Julian for March before anymore refills. 1st attempt  . metFORMIN (GLUCOPHAGE) 1000 MG tablet TAKE 1 TABLET BY MOUTH TWICE DAILY WITH A MEAL (Patient taking differently: Take 500 mg by mouth 2 (two) times daily. )  . metoprolol succinate (TOPROL-XL) 100 MG 24 hr tablet TAKE 1 TABLET BY MOUTH ONCE DAILY. TAKE WITH OR IMMEDIATELY FOLLOWING A MEAL  . omeprazole (PRILOSEC) 20 MG capsule Take 1 capsule by mouth  once daily  . potassium chloride SA (KLOR-CON) 20 MEQ tablet Take 1 tablet by mouth once daily  . pravastatin (PRAVACHOL) 40 MG tablet TAKE 1 TABLET BY MOUTH ONCE DAILY IN THE EVENING  . warfarin (COUMADIN) 5 MG tablet TAKE 1 & 1/2 (ONE & ONE-HALF) TABLETS BY MOUTH ONCE DAILY AT  6  PM   No facility-administered encounter medications on file as of 08/31/2020.     REVIEW OF SYSTEMS  : All other systems reviewed and negative except where noted in the History of Present Illness.   PHYSICAL EXAM: BP (!) 136/56   Pulse (!) 54   Ht _0  (1.651 m)   Wt 172 lb 4 oz (78.1 kg)   BMI 28.66 kg/m  General: Well developed AA male in no acute distress Head: Normocephalic and atraumatic Eyes:  Sclerae anicteric, conjunctiva pink. Ears: Normal auditory acuity Lungs: Clear throughout to auscultation; no W/R/R. Heart: Regular rate and rhythm; no M/R/G. Abdomen: Soft, non-distended.  BS present.  Non-tender. Rectal:  Will be done at the time of colonoscopy. Musculoskeletal: Symmetrical with no gross deformities  Skin: No lesions on visible extremities Extremities: No edema  Neurological: Alert oriented x 4, grossly non-focal Psychological:  Alert and cooperative. Normal mood and affect  ASSESSMENT AND PLAN: *Screening colon:  Last colonoscopy 2012.  PCP sent referral for CRC screening.  Will plan for colonoscopy with Dr. Fuller Plan.  He denies any GI complaints. *Chronic anticoagulation with coumadin due to atrial fibrillation:  Will hold coumadin for 5 days prior to endoscopic procedures - will instruct when and how to resume after procedure. Benefits and risks of procedure explained including risks of bleeding, perforation, infection, missed lesions, reactions to medications and possible need for hospitalization and surgery for complications. Additional rare but real risk of stroke or other vascular clotting events off of coumadin also explained and need to seek urgent help if any signs of these problems  occur. Will communicate by phone or EMR with patient's prescribing provider, Dr. Tamala Julian, to confirm that holding coumadin is reasonable in this case.    CC:  Dettinger, Fransisca Kaufmann, MD

## 2020-08-31 NOTE — Telephone Encounter (Signed)
[facility] Pre-operative Risk Assessment     Request for surgical clearance:     Endoscopy Procedure  What type of surgery is being performed?     Colonoscopy  When is this surgery scheduled?     09/06/20  What type of clearance is required ?   Pharmacy  Are there any medications that need to be held prior to surgery and how long? Coumadin starting 5 days prior  Practice name and name of physician performing surgery?      [facility]  What is your office phone and fax number?      Phone- [phone]  [phone]  Anesthesia type (None, local, MAC, general) ?       MAC

## 2020-08-31 NOTE — Chronic Care Management (AMB) (Signed)
Chronic Care Management    Clinical Social Work Follow Up Note  08/31/2020 Name: Alan [name]. MRN: [id number] DOB: Oct 13, 1941  Alan Netters. is a 79 y.o. year old male who is a primary care patient of Dettinger, Fransisca Kaufmann, MD. The CCM team was consulted for assistance with [employer] .   Review of patient status, including review of consultants reports, other relevant assessments, and collaboration with appropriate care team members and the patient's provider was performed as part of comprehensive patient evaluation and provision of chronic care management services.    SDOH (Social Determinants of Health) assessments performed: No;risk for tobacco use; risk for depression; risk for stress; risk for physical inactivity    Chronic Care Management from 06/22/2020 in [place]  PHQ-9 Total Score 2     GAD 7 : Generalized Anxiety Score 06/22/2020  Nervous, Anxious, on Edge 0  Control/stop worrying 0  Worry too much - different things 0  Trouble relaxing 0  Restless 0  Easily annoyed or irritable 0  Afraid - awful might happen 0  Total GAD 7 Score 0  Anxiety Difficulty Not difficult at all    Outpatient Encounter Medications as of 08/31/2020  Medication Sig  . amLODipine (NORVASC) 10 MG tablet TAKE 1 TABLET BY MOUTH ONCE DAILY IN THE EVENING  . Blood Glucose Monitoring Suppl (ONE TOUCH ULTRA MINI) w/Device KIT 1 each by Does not apply route 2 (two) times daily.  . chlorthalidone (HYGROTON) 25 MG tablet Take 1 tablet (25 mg total) by mouth daily.  . Ferrous Sulfate (IRON) 325 (65 Fe) MG TABS Take 1 tablet by mouth once daily with breakfast  . finasteride (PROSCAR) 5 MG tablet Take 1 tablet (5 mg total) by mouth daily.  . fluticasone (FLONASE) 50 MCG/ACT nasal spray Place 1 spray into both nostrils 2 (two) times daily as needed for allergies or rhinitis.  Marland Kitchen glucose blood (ONE TOUCH ULTRA TEST) test strip Use to check BG once daily.  Dx:  Type 2 DM  controlled E11.9  . lisinopril (ZESTRIL) 20 MG tablet Take 1.5 tablets (30 mg total) by mouth daily. Please make yearly appt with Dr. Tamala Julian for March before anymore refills. 1st attempt  . metFORMIN (GLUCOPHAGE) 1000 MG tablet TAKE 1 TABLET BY MOUTH TWICE DAILY WITH A MEAL  . metoprolol succinate (TOPROL-XL) 100 MG 24 hr tablet TAKE 1 TABLET BY MOUTH ONCE DAILY. TAKE WITH OR IMMEDIATELY FOLLOWING A MEAL  . omeprazole (PRILOSEC) 20 MG capsule Take 1 capsule by mouth once daily  . potassium chloride SA (KLOR-CON) 20 MEQ tablet Take 1 tablet by mouth once daily  . pravastatin (PRAVACHOL) 40 MG tablet TAKE 1 TABLET BY MOUTH ONCE DAILY IN THE EVENING  . warfarin (COUMADIN) 5 MG tablet TAKE 1 & 1/2 (ONE & ONE-HALF) TABLETS BY MOUTH ONCE DAILY AT  6  PM   No facility-administered encounter medications on file as of 08/31/2020.    Goals    .  Client will talk with LCSW in next 30 days about health needs of clinet and client completion of daily activities (pt-stated)      CARE PLAN ENTRY   Current Barriers:  . Patient with chronic diagnoses of HLD, Gout, Prostate Cancer, GERD, HTN, Type 2 DM, Atrial Fibrillation, Hx DVT  Clinical Social Work Clinical Goal(s):  Marland Kitchen LCSW will call client in next 30 days to discuss health needs of client and to discuss client completion of daily activites  Interventions:  Talked  with Arville Go about pain issues of client Talked with Arville Go about appetite of client Talked with Arville Go about ambulation needs of client Talked with Arville Go about social support of client(support from son and grandchildren) Talked with Arville Go about upcoming medical appointments for client Talked with Arville Go about client completion of ADLs Talked with Arville Go about medication procurement of client Talked with Arville Go about client relaxation techniques (watches TV, repairing items at home, enjoys being outdoors, caring for yard) Talked with Arville Go about vision of client (client has ordered new  glasses) Talked with Arville Go about health needs of client Discussed with [facility] CCM program support . Encouraged client or Arville Go to call RNCM as needed to discuss nursing needs of client Talked with Arville Go about client upcoming medical procedure on September 30, 2020. Talked with Arville Go about transport needs of client Talked with Arville Go about mood of client Talked with Arville Go about sleeping issues of client Talked with Arville Go about client challenges in standing from a sitting position Talked with Arville Go about client's appointment today with Gastroenterologist  Patient Self Care Activities:   Completes ADLs independently Drives to needed appointments   Patient Self Care Deficits:   Slight hearing deficits  Initial goal documentation    Follow Up Plan:  LCSW to call client or Alan [name], spouse of client, in next 4 weeks to discuss health needs of client and to discuss client completion of daily activities  Norva Riffle.Khylah Kendra MSW, LCSW Licensed Clinical Social Worker [facility] Care Management [phone]

## 2020-09-01 ENCOUNTER — Other Ambulatory Visit: Payer: Self-pay

## 2020-09-01 ENCOUNTER — Ambulatory Visit (INDEPENDENT_AMBULATORY_CARE_PROVIDER_SITE_OTHER): Payer: Medicare Other | Admitting: Pharmacist

## 2020-09-01 DIAGNOSIS — Z86718 Personal history of other venous thrombosis and embolism: Secondary | ICD-10-CM | POA: Diagnosis not present

## 2020-09-01 LAB — COAGUCHEK XS/INR WAIVED
INR: 2.7 — ABNORMAL HIGH (ref 0.9–1.1)
Prothrombin Time: 31.8 s

## 2020-09-01 MED ORDER — ENOXAPARIN SODIUM 120 MG/0.8ML ~~LOC~~ SOLN: 120.0000 mg | Freq: Every evening | SUBCUTANEOUS | 0 refills | Status: DC

## 2020-09-01 NOTE — Telephone Encounter (Signed)
Pt is holding coumadin and to see PCP today.  He will call if they do not discuss injections.

## 2020-09-01 NOTE — Progress Notes (Signed)
° ° °  09/01/2020 Name: Alan [name]. MRN: [id number] DOB: August 28, 1941   S:  39 yoM Presents for INR/coumadin management.  Patient has colonscopy planned for 09/06/20.  INR today is 2.7. Patient was referred and last seen by Primary Care Provider on 08/25/20. Today's weight 169.4lb Patient prefers once daily dosing for lovenox.  Bridge plan for patient is as follows:   09/01/2020: No Coumadin or Lovenox.   09/02/2020: Inject Lovenox 120mg  in the fatty abdominal tissue at least 2 inches from the belly button once daily at 11 PM. Rotate sites. No Coumadin.   09/03/2020: Inject Lovenox 120mg  in the fatty tissue every once daily at 11 PM. No Coumadin.   09/04/2020: Inject Lovenox 120mg  in the fatty tissue every once daily at 11 PM. No Coumadin.   09/05/2020: No Lovenox. No Coumadin.   09/06/2020: Procedure Day (11:30 AM) - No Lovenox - Resume Coumadin in the evening or as directed by doctor (take an extra half tablet with usual dose for 2 days then resume normal dose).   09/07/2020: Inject Lovenox 120mg  in the fatty tissue every once daily and take Coumadin.   09/08/2020: Inject Lovenox 120mg  in the fatty tissue every once daily and take Coumadin.   09/09/2020: Inject Lovenox 120mg  in the fatty tissue every once daily and take Coumadin.   09/10/2020: Inject Lovenox 120mg  in the fatty tissue every once daily and take Coumadin.   09/11/2020: Inject Lovenox 120mg  in the fatty tissue every once daily and take Coumadin.    09/12/2020: Inject Lovenox 120mg  in the fatty tissue every once daily and take Coumadin.   09/13/2020 Coumadin appt to check INR   Written patient instructions provided.  Total time in face to face counseling 15 minutes.   Follow up PCP Clinic Visit ON 09/13/20.   Regina Eck, PharmD, BCPS Clinical Pharmacist, [place]  [facility] Phone [phone]

## 2020-09-01 NOTE — Telephone Encounter (Signed)
Patient with diagnosis of afib on warfarin for anticoagulation.    Procedure: Colonoscopy Date of procedure: 09/06/2020  CHADS2-VASc score of  6 (HTN, AGE, DM2, VTE x2, AGE)  Patient had an embolism of the splenic artery in 2016 and hx of DVT. He has been bridged in the past by his PCP who manages his INR.  CrCl 41 ml/min Platelet count 249  Per office protocol, patient can hold warfrain for 5 days prior to procedure.   Patient WILL need bridging with Lovenox (enoxaparin) around procedure.  I will ask Dr. Warrick Parisian to coordinate his bridge if possible since he manges his INR. This will need to be coordinated today as patient needs to start holding TODAY.

## 2020-09-01 NOTE — Telephone Encounter (Signed)
We do not follow pt's coumadin.  His PCP does so please contact their office for instructions.

## 2020-09-02 NOTE — Telephone Encounter (Signed)
Patient visit completed with PharmD and PCP on 9/22

## 2020-09-03 ENCOUNTER — Telehealth: Payer: Self-pay | Admitting: Gastroenterology

## 2020-09-03 NOTE — Telephone Encounter (Signed)
Contacted the [facility] to have them run the coupon code the was sent along the with the prescription. They have now fixed this and I have left a voice mail to let the patient know.

## 2020-09-06 ENCOUNTER — Encounter: Payer: Self-pay | Admitting: Gastroenterology

## 2020-09-06 ENCOUNTER — Other Ambulatory Visit: Payer: Self-pay | Admitting: Urology

## 2020-09-06 ENCOUNTER — Telehealth: Payer: Self-pay | Admitting: Urology

## 2020-09-06 ENCOUNTER — Telehealth: Payer: Self-pay | Admitting: Family Medicine

## 2020-09-06 ENCOUNTER — Other Ambulatory Visit: Payer: Self-pay

## 2020-09-06 ENCOUNTER — Ambulatory Visit (AMBULATORY_SURGERY_CENTER): Payer: Medicare Other | Admitting: Gastroenterology

## 2020-09-06 ENCOUNTER — Other Ambulatory Visit: Payer: Self-pay | Admitting: Family Medicine

## 2020-09-06 VITALS — BP 118/55 | HR 46 | Temp 97.5°F | Resp 18 | Ht 65.0 in | Wt 172.0 lb

## 2020-09-06 DIAGNOSIS — D123 Benign neoplasm of transverse colon: Secondary | ICD-10-CM

## 2020-09-06 DIAGNOSIS — D124 Benign neoplasm of descending colon: Secondary | ICD-10-CM

## 2020-09-06 DIAGNOSIS — Z86010 Personal history of colonic polyps: Secondary | ICD-10-CM | POA: Diagnosis not present

## 2020-09-06 DIAGNOSIS — I152 Hypertension secondary to endocrine disorders: Secondary | ICD-10-CM

## 2020-09-06 MED ORDER — SODIUM CHLORIDE 0.9 % IV SOLN: 500.0000 mL | Freq: Once | INTRAVENOUS | Status: DC

## 2020-09-06 NOTE — Progress Notes (Signed)
Called to room to assist during endoscopic procedure.  Patient ID and intended procedure confirmed with present staff. Received instructions for my participation in the procedure from the performing physician.  

## 2020-09-06 NOTE — Progress Notes (Signed)
PT taken to PACU. Monitors in place. VSS. Report given to RN. 

## 2020-09-06 NOTE — Op Note (Addendum)
[facility] Patient Name: Alan [name] Procedure Date: 09/06/2020 11:13 AM MRN: [id number] Endoscopist: Ladene Artist , MD Age: 79 Referring MD:  Date of Birth: Aug 18, 1941 Gender: Male Account #: [id number] Procedure:                Colonoscopy Indications:              Surveillance: Personal history of adenomatous                            polyps on last colonoscopy > 5 years ago Medicines:                Monitored Anesthesia Care Procedure:                Pre-Anesthesia Assessment:                           - Prior to the procedure, a History and Physical                            was performed, and patient medications and                            allergies were reviewed. The patient's tolerance of                            previous anesthesia was also reviewed. The risks                            and benefits of the procedure and the sedation                            options and risks were discussed with the patient.                            All questions were answered, and informed consent                            was obtained. Prior Anticoagulants: The patient has                            taken Coumadin (warfarin), last dose was 5 days                            prior to procedure. Lovenox last dose 1 day prior                            to procedure. ASA Grade Assessment: III - A patient                            with severe systemic disease. After reviewing the                            risks and benefits, the patient was deemed in  satisfactory condition to undergo the procedure.                           After obtaining informed consent, the colonoscope                            was passed under direct vision. Throughout the                            procedure, the patient's blood pressure, pulse, and                            oxygen saturations were monitored continuously. The                            Colonoscope was  introduced through the anus and                            advanced to the the cecum, identified by                            appendiceal orifice and ileocecal valve. The                            ileocecal valve, appendiceal orifice, and rectum                            were photographed. The quality of the bowel                            preparation was good. The colonoscopy was performed                            without difficulty. The patient tolerated the                            procedure well. Scope In: 11:17:30 AM Scope Out: 11:31:22 AM Scope Withdrawal Time: 0 hours 10 minutes 39 seconds  Total Procedure Duration: 0 hours 13 minutes 52 seconds  Findings:                 The perianal and digital rectal examinations were                            normal.                           Two sessile polyps were found in the descending                            colon and transverse colon. The polyps were 6 to 7                            mm in size. These polyps were removed with a cold  snare. Resection and retrieval were complete.                           A few small-mouthed diverticula were found in the                            right colon. There was no evidence of diverticular                            bleeding.                           Multiple medium-mouthed diverticula were found in                            the left colon. There was evidence of diverticular                            spasm. There was no evidence of diverticular                            bleeding.                           Internal hemorrhoids were found during                            retroflexion. The hemorrhoids were small and Grade                            I (internal hemorrhoids that do not prolapse).                           The exam was otherwise without abnormality on                            direct and retroflexion views. Complications:            No  immediate complications. Estimated blood loss:                            None. Estimated Blood Loss:     Estimated blood loss: none. Impression:               - Two 6 to 7 mm polyps in the descending colon and                            in the transverse colon, removed with a cold snare.                            Resected and retrieved.                           - Mild diverticulosis in the right colon.                           -  Moderate diverticulosis in the left colon.                           - Internal hemorrhoids.                           - The examination was otherwise normal on direct                            and retroflexion views. Recommendation:           - Resume Lovenox tomorrow at prior dose and                            Coumadin (warfarin) in 2 days at prior dose. Refer                            to managing physician for further adjustment of                            therapy.                           - Patient has a contact number available for                            emergencies. The signs and symptoms of potential                            delayed complications were discussed with the                            patient. Return to normal activities tomorrow.                            Written discharge instructions were provided to the                            patient.                           - High fiber diet.                           - Continue present medications.                           - Await pathology results.                           - No aspirin, ibuprofen, naproxen, or other                            non-steroidal anti-inflammatory drugs for 2 weeks                            after polyp removal. Alan Riffle  [name] Plan, MD 09/06/2020 11:35:49 AM This report has been signed electronically.

## 2020-09-06 NOTE — Progress Notes (Signed)
VS-CW 

## 2020-09-06 NOTE — Patient Instructions (Signed)
Discharge instructions given. Handouts on polyps,diverticulosis and hemorrhoids. Resume Lovenox tomorrow at prior dose and Coumadin in 2 days at prior dose. No aspirin,ibuprofen,naproxen,or other non-steroidal anti-inflammatory drugs for weeks. YOU HAD AN ENDOSCOPIC PROCEDURE TODAY AT [facility]:   Refer to the procedure report that was given to you for any specific questions about what was found during the examination.  If the procedure report does not answer your questions, please call your gastroenterologist to clarify.  If you requested that your care partner not be given the details of your procedure findings, then the procedure report has been included in a sealed envelope for you to review at your convenience later.  YOU SHOULD EXPECT: Some feelings of bloating in the abdomen. Passage of more gas than usual.  Walking can help get rid of the air that was put into your GI tract during the procedure and reduce the bloating. If you had a lower endoscopy (such as a colonoscopy or flexible sigmoidoscopy) you may notice spotting of blood in your stool or on the toilet paper. If you underwent a bowel prep for your procedure, you may not have a normal bowel movement for a few days.  Please Note:  You might notice some irritation and congestion in your nose or some drainage.  This is from the oxygen used during your procedure.  There is no need for concern and it should clear up in a day or so.  SYMPTOMS TO REPORT IMMEDIATELY:   Following lower endoscopy (colonoscopy or flexible sigmoidoscopy):  Excessive amounts of blood in the stool  Significant tenderness or worsening of abdominal pains  Swelling of the abdomen that is new, acute  Fever of 100F or higher   For urgent or emergent issues, a gastroenterologist can be reached at any hour by calling [phone]. Do not use MyChart messaging for urgent concerns.    DIET:  We do recommend a small meal at first, but then you may  proceed to your regular diet.  Drink plenty of fluids but you should avoid alcoholic beverages for 24 hours.  ACTIVITY:  You should plan to take it easy for the rest of today and you should NOT DRIVE or use heavy machinery until tomorrow (because of the sedation medicines used during the test).    FOLLOW UP: Our staff will call the number listed on your records 48-72 hours following your procedure to check on you and address any questions or concerns that you may have regarding the information given to you following your procedure. If we do not reach you, we will leave a message.  We will attempt to reach you two times.  During this call, we will ask if you have developed any symptoms of COVID 19. If you develop any symptoms (ie: fever, flu-like symptoms, shortness of breath, cough etc.) before then, please call [phone].  If you test positive for Covid 19 in the 2 weeks post procedure, please call and report this information to [place].    If any biopsies were taken you will be contacted by phone or by letter within the next 1-3 weeks.  Please call us at [phone] if you have not heard about the biopsies in 3 weeks.    SIGNATURES/CONFIDENTIALITY: You and/or your care partner have signed paperwork which will be entered into your electronic medical record.  These signatures attest to the fact that that the information above on your After Visit Summary has been reviewed and is understood.  Full responsibility of  the confidentiality of this discharge information lies with you and/or your care-partner.

## 2020-09-06 NOTE — Telephone Encounter (Signed)
Does patient still need a PSA before he does his surgery for his seed implants??

## 2020-09-06 NOTE — Telephone Encounter (Signed)
Alan [name] can you write up a bridge for him for his surgery on November 18 as well.  Thank you

## 2020-09-07 ENCOUNTER — Telehealth: Payer: Self-pay | Admitting: *Deleted

## 2020-09-07 NOTE — Telephone Encounter (Signed)
[phone] Pam--calling to let pcp know that surgery date changed to Nov. 30 2021.

## 2020-09-07 NOTE — Telephone Encounter (Signed)
Called patient to inform of pre-seed appts. for 09-30-20 and his implant for 11-09-20, spoke with patient and he is aware of these appts.

## 2020-09-07 NOTE — Telephone Encounter (Signed)
He doesn't need the PSA done.

## 2020-09-08 ENCOUNTER — Telehealth: Payer: Self-pay | Admitting: *Deleted

## 2020-09-08 NOTE — Telephone Encounter (Signed)
°  Follow up Call-  Call back number 09/06/2020  Post procedure Call Back phone  # [phone]  Permission to leave phone message Yes  Some recent data might be hidden     Patient questions:  Do you have a fever, pain , or abdominal swelling? No. Pain Score  0 *  Have you tolerated food without any problems? Yes.    Have you been able to return to your normal activities? Yes.    Do you have any questions about your discharge instructions: Diet   No. Medications  No. Follow up visit  No.  Do you have questions or concerns about your Care? No.  Actions: * If pain score is 4 or above: 1. No action needed, pain <4.Have you developed a fever since your procedure? no  2.   Have you had an respiratory symptoms (SOB or cough) since your procedure? no  3.   Have you tested positive for COVID 19 since your procedure no  4.   Have you had any family members/close contacts diagnosed with the COVID 19 since your procedure?  no   If yes to any of these questions please route to Joylene Samwise, RN and Joella Prince, RN

## 2020-09-10 ENCOUNTER — Encounter: Payer: Self-pay | Admitting: Medical Oncology

## 2020-09-10 NOTE — Progress Notes (Signed)
Attempted to reach patient to follow up post radiation consult. No answer or voice mail set up.

## 2020-09-13 ENCOUNTER — Encounter: Payer: Self-pay | Admitting: Family Medicine

## 2020-09-13 ENCOUNTER — Ambulatory Visit (INDEPENDENT_AMBULATORY_CARE_PROVIDER_SITE_OTHER): Payer: Medicare Other | Admitting: Family Medicine

## 2020-09-13 ENCOUNTER — Other Ambulatory Visit: Payer: Self-pay

## 2020-09-13 VITALS — BP 150/55 | HR 53 | Temp 98.1°F | Ht 65.0 in | Wt 167.1 lb

## 2020-09-13 DIAGNOSIS — D6859 Other primary thrombophilia: Secondary | ICD-10-CM

## 2020-09-13 DIAGNOSIS — I480 Paroxysmal atrial fibrillation: Secondary | ICD-10-CM | POA: Diagnosis not present

## 2020-09-13 DIAGNOSIS — Z23 Encounter for immunization: Secondary | ICD-10-CM

## 2020-09-13 DIAGNOSIS — Z7901 Long term (current) use of anticoagulants: Secondary | ICD-10-CM

## 2020-09-13 DIAGNOSIS — Z86718 Personal history of other venous thrombosis and embolism: Secondary | ICD-10-CM

## 2020-09-13 LAB — COAGUCHEK XS/INR WAIVED
INR: 1.7 — ABNORMAL HIGH (ref 0.9–1.1)
Prothrombin Time: 20.8 s

## 2020-09-13 NOTE — Progress Notes (Signed)
BP (!) 150/55   Pulse (!) 53   Temp 98.1 F (36.7 C)   Ht '5\' 5"'  (1.651 m)   Wt 167 lb 2 oz (75.8 kg)   SpO2 100%   BMI 27.81 kg/m    Subjective:   Patient ID: Alan Netters., male    DOB: [birth year], 79 y.o.   MRN: [id number]  HPI: Alan Gram. is a 79 y.o. male presenting on 09/13/2020 for Medical Management of Chronic Issues and Atrial Fibrillation   HPI Coumadin recheck Target goal: 2.0-3.0 Reason on anticoagulation: A. fib with history of DVTs Patient denies any bruising or bleeding or chest pain or palpitations   Relevant past medical, surgical, family and social history reviewed and updated as indicated. Interim medical history since our last visit reviewed. Allergies and medications reviewed and updated.  Review of Systems  Constitutional: Negative for chills and fever.  Eyes: Negative for visual disturbance.  Respiratory: Negative for shortness of breath and wheezing.   Cardiovascular: Negative for chest pain and leg swelling.  Musculoskeletal: Negative for back pain and gait problem.  Skin: Negative for rash.  Neurological: Negative for dizziness, weakness and light-headedness.  All other systems reviewed and are negative.   Per HPI unless specifically indicated above   Allergies as of 09/13/2020      Reactions   Allopurinol Other (See Comments)   Significantly decreased WBC's   Colchicine Other (See Comments)   Significantly decreased WBC's      Medication List       Accurate as of September 13, 2020 11:47 AM. If you have any questions, ask your nurse or doctor.        amLODipine 10 MG tablet Commonly known as: NORVASC TAKE 1 TABLET BY MOUTH ONCE DAILY IN THE EVENING   chlorthalidone 25 MG tablet Commonly known as: HYGROTON Take 1 tablet (25 mg total) by mouth daily.   enoxaparin 120 MG/0.8ML injection Commonly known as: Lovenox Inject 0.8 mLs (120 mg total) into the skin at bedtime. Start on 09/02/20   finasteride 5 MG tablet Commonly  known as: PROSCAR Take 1 tablet (5 mg total) by mouth daily.   fluticasone 50 MCG/ACT nasal spray Commonly known as: FLONASE Place 1 spray into both nostrils 2 (two) times daily as needed for allergies or rhinitis.   glucose blood test strip Commonly known as: ONE TOUCH ULTRA TEST Use to check BG once daily.  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   metFORMIN 1000 MG tablet Commonly known as: GLUCOPHAGE TAKE 1 TABLET BY MOUTH TWICE DAILY WITH A MEAL What changed: See the new instructions.   metoprolol succinate 100 MG 24 hr tablet Commonly known as: TOPROL-XL TAKE 1 TABLET BY MOUTH ONCE DAILY. TAKE WITH OR IMMEDIATELY FOLLOWING A MEAL   omeprazole 20 MG capsule Commonly known as: PRILOSEC Take 1 capsule by mouth once daily   ONE TOUCH ULTRA MINI w/Device Kit 1 each by Does not apply route 2 (two) times daily.   potassium chloride SA 20 MEQ tablet Commonly known as: KLOR-CON Take 1 tablet by mouth once daily   pravastatin 40 MG tablet Commonly known as: PRAVACHOL TAKE 1 TABLET BY MOUTH ONCE DAILY IN THE EVENING   warfarin 5 MG tablet Commonly known as: COUMADIN Take as  directed by the anticoagulation clinic. If you are unsure how to take this medication, talk to your nurse or doctor. Original instructions: TAKE 1 & 1/2 (ONE & ONE-HALF) TABLETS BY MOUTH ONCE DAILY AT  6  PM        Objective:   BP (!) 150/55   Pulse (!) 53   Temp 98.1 F (36.7 C)   Ht '5\' 5"'  (1.651 m)   Wt 167 lb 2 oz (75.8 kg)   SpO2 100%   BMI 27.81 kg/m   Wt Readings from Last 3 Encounters:  09/13/20 167 lb 2 oz (75.8 kg)  09/06/20 172 lb (78 kg)  08/31/20 172 lb 4 oz (78.1 kg)    Physical Exam Vitals and nursing note reviewed.  Constitutional:      General: He is not in acute distress.    Appearance: He is  well-developed. He is not diaphoretic.  Eyes:     General: No scleral icterus.    Conjunctiva/sclera: Conjunctivae normal.  Neck:     Thyroid: No thyromegaly.  Musculoskeletal:     Cervical back: Neck supple.  Lymphadenopathy:     Cervical: No cervical adenopathy.  Skin:    General: Skin is warm and dry.     Findings: No rash.  Neurological:     Mental Status: He is alert and oriented to person, place, and time.     Coordination: Coordination normal.  Psychiatric:        Behavior: Behavior normal.     Results for orders placed or performed in visit on 09/13/20  CoaguChek XS/INR Waived  Result Value Ref Range   INR 1.7 (H) 0.9 - 1.1   Prothrombin Time 20.8 sec   Description   Take 2- 41m tablets today and then  Continue current dose to take 1 tablet every day of the week except for Mondays, Wednesdays and Saturdays take 1 1/2 tablets INR was 1.7 (goal 2-3)   Follow-up 1-2 weeks     Assessment & Plan:   Problem List Items Addressed This Visit      Cardiovascular and Mediastinum   Paroxysmal atrial fibrillation ([place]   Relevant Orders   CoaguChek XS/INR Waived (Completed)     Hematopoietic and Hemostatic   [place] hypercoagulable state ([facility] [D68.59]     Other   Long term (current) use of anticoagulants [Z79.01]   History of DVT (deep vein thrombosis)    Other Visit Diagnoses    Flu vaccine need    -  Primary   Relevant Orders   Flu Vaccine QUAD High Dose(Fluad) (Completed)      Made changes to her dialysis, he will come back in 1 to 2 weeks but will need to come back before his surgery planned on November 30. Follow up plan: Return if symptoms worsen or fail to improve, for 1-2 weeks inr recheck.  Counseling provided for all of the vaccine components Orders Placed This Encounter  Procedures  . Flu Vaccine QUAD High Dose(Fluad)  . CoaguChek XS/INR [place] [facility] 09/13/2020, 11:47 AM

## 2020-09-15 ENCOUNTER — Encounter: Payer: Self-pay | Admitting: Gastroenterology

## 2020-09-21 ENCOUNTER — Encounter: Payer: Self-pay | Admitting: Family Medicine

## 2020-09-21 ENCOUNTER — Ambulatory Visit (INDEPENDENT_AMBULATORY_CARE_PROVIDER_SITE_OTHER): Payer: Medicare Other | Admitting: Family Medicine

## 2020-09-21 ENCOUNTER — Other Ambulatory Visit: Payer: Self-pay

## 2020-09-21 VITALS — BP 150/59 | HR 55 | Temp 98.1°F | Ht 65.0 in | Wt 171.0 lb

## 2020-09-21 DIAGNOSIS — I480 Paroxysmal atrial fibrillation: Secondary | ICD-10-CM | POA: Diagnosis not present

## 2020-09-21 DIAGNOSIS — Z86718 Personal history of other venous thrombosis and embolism: Secondary | ICD-10-CM | POA: Diagnosis not present

## 2020-09-21 DIAGNOSIS — D6859 Other primary thrombophilia: Secondary | ICD-10-CM

## 2020-09-21 DIAGNOSIS — Z7901 Long term (current) use of anticoagulants: Secondary | ICD-10-CM | POA: Diagnosis not present

## 2020-09-21 LAB — COAGUCHEK XS/INR WAIVED
INR: 3.1 — ABNORMAL HIGH (ref 0.9–1.1)
Prothrombin Time: 36.9 s

## 2020-09-21 NOTE — Progress Notes (Signed)
BP (!) 150/59   Pulse (!) 55   Temp 98.1 F (36.7 C)   Ht '5\' 5"'  (1.651 m)   Wt 171 lb (77.6 kg)   SpO2 100%   BMI 28.46 kg/m    Subjective:   Patient ID: Alan Netters., male    DOB: 1941/09/06, 79 y.o.   MRN: [id number]  HPI: Alan Frangos. is a 79 y.o. male presenting on 09/21/2020 for Medical Management of Chronic Issues and Atrial Fibrillation   HPI Coumadin recheck Target goal: 2.0-3.0 Reason on anticoagulation: History of DVTs and primary hypercoagulable state Patient denies any bruising or bleeding or chest pain or palpitations   Relevant past medical, surgical, family and social history reviewed and updated as indicated. Interim medical history since our last visit reviewed. Allergies and medications reviewed and updated.  Review of Systems  Constitutional: Negative for chills and fever.  Respiratory: Negative for shortness of breath and wheezing.   Cardiovascular: Negative for chest pain and leg swelling.  Musculoskeletal: Negative for back pain and gait problem.  Skin: Negative for rash.  Neurological: Negative for dizziness, weakness and numbness.  All other systems reviewed and are negative.   Per HPI unless specifically indicated above   Allergies as of 09/21/2020      Reactions   Allopurinol Other (See Comments)   Significantly decreased WBC's   Colchicine Other (See Comments)   Significantly decreased WBC's      Medication List       Accurate as of September 21, 2020  8:14 AM. If you have any questions, ask your nurse or doctor.        amLODipine 10 MG tablet Commonly known as: NORVASC TAKE 1 TABLET BY MOUTH ONCE DAILY IN THE EVENING   chlorthalidone 25 MG tablet Commonly known as: HYGROTON Take 1 tablet (25 mg total) by mouth daily.   enoxaparin 120 MG/0.8ML injection Commonly known as: Lovenox Inject 0.8 mLs (120 mg total) into the skin at bedtime. Start on 09/02/20   finasteride 5 MG tablet Commonly known as: PROSCAR Take 1 tablet  (5 mg total) by mouth daily.   fluticasone 50 MCG/ACT nasal spray Commonly known as: FLONASE Place 1 spray into both nostrils 2 (two) times daily as needed for allergies or rhinitis.   glucose blood test strip Commonly known as: ONE TOUCH ULTRA TEST Use to check BG once daily.  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   metFORMIN 1000 MG tablet Commonly known as: GLUCOPHAGE TAKE 1 TABLET BY MOUTH TWICE DAILY WITH A MEAL What changed: See the new instructions.   metoprolol succinate 100 MG 24 hr tablet Commonly known as: TOPROL-XL TAKE 1 TABLET BY MOUTH ONCE DAILY. TAKE WITH OR IMMEDIATELY FOLLOWING A MEAL   omeprazole 20 MG capsule Commonly known as: PRILOSEC Take 1 capsule by mouth once daily   ONE TOUCH ULTRA MINI w/Device Kit 1 each by Does not apply route 2 (two) times daily.   potassium chloride SA 20 MEQ tablet Commonly known as: KLOR-CON Take 1 tablet by mouth once daily   pravastatin 40 MG tablet Commonly known as: PRAVACHOL TAKE 1 TABLET BY MOUTH ONCE DAILY IN THE EVENING   warfarin 5 MG tablet Commonly known as: COUMADIN Take as directed by the anticoagulation clinic. If  you are unsure how to take this medication, talk to your nurse or doctor. Original instructions: TAKE 1 & 1/2 (ONE & ONE-HALF) TABLETS BY MOUTH ONCE DAILY AT  6  PM        Objective:   BP (!) 150/59   Pulse (!) 55   Temp 98.1 F (36.7 C)   Ht '5\' 5"'  (1.651 m)   Wt 171 lb (77.6 kg)   SpO2 100%   BMI 28.46 kg/m   Wt Readings from Last 3 Encounters:  09/21/20 171 lb (77.6 kg)  09/13/20 167 lb 2 oz (75.8 kg)  09/06/20 172 lb (78 kg)    Physical Exam Vitals and nursing note reviewed.  Constitutional:      General: He is not in acute distress.    Appearance: He is well-developed. He is not diaphoretic.  Eyes:      General: No scleral icterus.    Conjunctiva/sclera: Conjunctivae normal.  Neck:     Thyroid: No thyromegaly.  Cardiovascular:     Rate and Rhythm: Normal rate and regular rhythm.     Heart sounds: Normal heart sounds. No murmur heard.   Pulmonary:     Effort: Pulmonary effort is normal. No respiratory distress.     Breath sounds: Normal breath sounds. No wheezing.  Musculoskeletal:        General: Normal range of motion.     Cervical back: Neck supple.  Lymphadenopathy:     Cervical: No cervical adenopathy.  Skin:    General: Skin is warm and dry.     Findings: No rash.  Neurological:     Mental Status: He is alert and oriented to person, place, and time.     Coordination: Coordination normal.  Psychiatric:        Behavior: Behavior normal.       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  Follow-up 1-2 weeks     Follow up plan: Return if symptoms worsen or fail to improve, for INR recheck 2 to 3 weeks.  Counseling provided for all of the vaccine components Orders Placed This Encounter  Procedures  . CoaguChek XS/INR [place], MD [facility] 09/21/2020, 8:14 AM

## 2020-09-27 ENCOUNTER — Ambulatory Visit: Payer: Medicare Other | Admitting: Nurse Practitioner

## 2020-09-29 ENCOUNTER — Telehealth: Payer: Self-pay | Admitting: *Deleted

## 2020-09-29 NOTE — Telephone Encounter (Signed)
Called patient to remind of pre-seed appts. for 09-30-20, spoke with patient and he is aware of these appts.

## 2020-09-29 NOTE — Progress Notes (Signed)
  Radiation Oncology         [phone] ________________________________  Name: [name] MRN: [id number]  Date: 09/30/2020  DOB: Jul 02, 1941  SIMULATION AND TREATMENT PLANNING NOTE PUBIC ARCH STUDY  [id number], Fransisca Kaufmann, MD  Irine Seal, MD  DIAGNOSIS: 79 y.o. 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  Oncology History  Prostate cancer ([place])  04/13/2014 Initial Diagnosis   Prostate cancer ([place])   06/25/2020 Cancer Staging   Staging form: Prostate, AJCC 8th Edition - Clinical stage from 06/25/2020: Stage IIC (cT2b, cN0, cM0, PSA: 12.8, Grade Group: 3) - Signed by Freeman Caldron, PA-C on 09/29/2020       ICD-10-CM   1. Prostate cancer ([place])  C61     COMPLEX SIMULATION:  The patient presented today for evaluation for possible prostate seed implant. He was brought to the radiation planning suite and placed supine on the CT couch. A 3-dimensional image study set was obtained in upload to the planning computer. There, on each axial slice, I contoured the prostate gland. Then, using three-dimensional radiation planning tools I reconstructed the prostate in view of the structures from the transperineal needle pathway to assess for possible pubic arch interference. In doing so, I did not appreciate any pubic arch interference. Also, the patient's prostate volume was estimated based on the drawn structure. The volume was 47 cc.  Given the pubic arch appearance and prostate volume, patient remains a good candidate to proceed with prostate seed implant. Today, he freely provided informed written consent to proceed.    PLAN: The patient will undergo prostate seed implant.   ________________________________  Sheral Apley. Tammi Klippel, M.D.

## 2020-09-30 ENCOUNTER — Encounter (HOSPITAL_COMMUNITY)
Admission: RE | Admit: 2020-09-30 | Discharge: 2020-09-30 | Disposition: A | Discharge status: Home / Self Care | Payer: Medicare Other | Source: Ambulatory Visit | Attending: Urology | Admitting: Urology

## 2020-09-30 ENCOUNTER — Ambulatory Visit
Admission: RE | Admit: 2020-09-30 | Discharge: 2020-09-30 | Disposition: A | Discharge status: Home / Self Care | Payer: Medicare Other | Source: Ambulatory Visit | Attending: Radiation Oncology | Admitting: Radiation Oncology

## 2020-09-30 ENCOUNTER — Ambulatory Visit (HOSPITAL_COMMUNITY)
Admission: RE | Admit: 2020-09-30 | Discharge: 2020-09-30 | Disposition: A | Discharge status: Home / Self Care | Payer: Medicare Other | Source: Ambulatory Visit | Attending: Urology | Admitting: Urology

## 2020-09-30 ENCOUNTER — Other Ambulatory Visit: Payer: Self-pay

## 2020-09-30 ENCOUNTER — Encounter: Payer: Self-pay | Admitting: Medical Oncology

## 2020-09-30 ENCOUNTER — Ambulatory Visit
Admission: RE | Admit: 2020-09-30 | Discharge: 2020-09-30 | Disposition: A | Discharge status: Home / Self Care | Payer: Medicare Other | Source: Ambulatory Visit | Attending: Urology | Admitting: Urology

## 2020-09-30 DIAGNOSIS — Z01818 Encounter for other preprocedural examination: Secondary | ICD-10-CM | POA: Diagnosis not present

## 2020-09-30 DIAGNOSIS — C61 Malignant neoplasm of prostate: Secondary | ICD-10-CM | POA: Diagnosis not present

## 2020-10-01 ENCOUNTER — Telehealth: Payer: Self-pay | Admitting: Interventional Cardiology

## 2020-10-01 NOTE — Telephone Encounter (Signed)
   [facility] Pre-operative Risk Assessment    HEARTCARE STAFF: - Please ensure there is not already an duplicate clearance open for this procedure. - Under Visit Info/Reason for Call, type in Other and utilize the format Clearance MM/DD/YY or Clearance TBD. Do not use dashes or single digits. - If request is for dental extraction, please clarify the # of teeth to be extracted.  Request for surgical clearance:  1. What type of surgery is being performed? Radioactive Seed Implant   2. When is this surgery scheduled? 11/09/20  3. What type of clearance is required (medical clearance vs. Pharmacy clearance to hold med vs. Both)? Both   4. Are there any medications that need to be held prior to surgery and how long? Warfarin per Dr's discretion on how long   5. Practice name and name of physician performing surgery? [facility], Dr. Jeffie Pollock  6. What is the office phone number? [phone]   7.   What is the office fax number? [phone]  8.   Anesthesia type (None, local, MAC, general) ? General    Trilby Drummer 10/01/2020, 3:04 PM  _________________________________________________________________   (provider comments below)

## 2020-10-01 NOTE — Progress Notes (Signed)
Pt had EKG and CXR done yesterday at PAT appointment ususal pre-op orders for prostate seed implants.  Noted change on EKG stating marked T wave abnormality, compared to previous EKG in epic dated 10-15-2019 seems similar.  Had anesthesia Konrad Felix PA, review, stated it looks similar and with pt's history urology (Dr Jeffie Pollock) should request cardiac clearance prior to surgery.  Called and spoke w/ Jeannene Patella, OR Scheduler for Dr Jeffie Pollock, informed her pt needing cardiac clearance per anesthesia, stated the she would and she already had sent for clearance for pt's coumadin clearance for from pt's pcp, Dr Dettinger.

## 2020-10-04 NOTE — Telephone Encounter (Signed)
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    Procedure: Radioactive Seed Implant Date of procedure: 11/09/2020  CHA2DS2-VASc Score = 6  This indicates a 9.7% annual risk of stroke. 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   Patient will need bridging with Lovenox (enoxaparin) around procedure.  Warfarin is managed by primary care. Will forward clearance to them to coordinate Lovenox bridge.

## 2020-10-04 NOTE — Telephone Encounter (Signed)
   Primary Cardiologist: Sinclair Grooms, MD  Chart reviewed as part of pre-operative protocol coverage. Patient has not been seen since 10/15/2019. Because of Alan Kuhnle Jr.'s past medical history and time since last visit, he will require a follow-up visit in order to better assess preoperative cardiovascular risk.   Pre-op covering staff: - Please schedule appointment and call patient to inform them. If patient already had an upcoming appointment within acceptable timeframe, please add "pre-op clearance" to the appointment notes so provider is aware. - Please contact requesting surgeon's office via preferred method (i.e, phone, fax) to inform them of need for appointment prior to surgery.   Darreld Mclean, PA-C  10/04/2020, 2:26 PM

## 2020-10-04 NOTE — Telephone Encounter (Signed)
Pharmacy, can you please comment on how long patient can hold Coumadin for upcoming procedure?  Thank you!

## 2020-10-04 NOTE — Telephone Encounter (Signed)
S/w pt's wife in regards to the pt will need an appt for pre op clearance for his upcoming procedure with Dr. Jeffie Pollock at [facility]. Pt has been scheduled to see Truitt Merle, NP 10/13/20 @ 10:15. I will forward notes to NP for upcoming appt. Will send FYI to requesting office pt has appt 10/13/20 for pre op assessment. Will remove from the pre op call back pool.

## 2020-10-07 ENCOUNTER — Ambulatory Visit: Payer: Medicare Other | Admitting: Licensed Clinical Social Worker

## 2020-10-07 DIAGNOSIS — M109 Gout, unspecified: Secondary | ICD-10-CM

## 2020-10-07 DIAGNOSIS — C61 Malignant neoplasm of prostate: Secondary | ICD-10-CM

## 2020-10-07 DIAGNOSIS — Z86718 Personal history of other venous thrombosis and embolism: Secondary | ICD-10-CM

## 2020-10-07 DIAGNOSIS — E1159 Type 2 diabetes mellitus with other circulatory complications: Secondary | ICD-10-CM

## 2020-10-07 DIAGNOSIS — E1169 Type 2 diabetes mellitus with other specified complication: Secondary | ICD-10-CM

## 2020-10-07 DIAGNOSIS — I480 Paroxysmal atrial fibrillation: Secondary | ICD-10-CM

## 2020-10-07 DIAGNOSIS — K219 Gastro-esophageal reflux disease without esophagitis: Secondary | ICD-10-CM

## 2020-10-07 NOTE — Chronic Care Management (AMB) (Signed)
Chronic Care Management    Clinical Social Work Follow Up Note  10/07/2020 Name: Alan [name]. MRN: [id number] DOB: 10-Dec-1941  Alan Netters. is a 79 y.o. year old male who is a primary care patient of Dettinger, Fransisca Kaufmann, MD. The CCM team was consulted for assistance with [employer] .   Review of patient status, including review of consultants reports, other relevant assessments, and collaboration with appropriate care team members and the patient's provider was performed as part of comprehensive patient evaluation and provision of chronic care management services.    SDOH (Social Determinants of Health) assessments performed: No; risk for depression; risk for tobacco use; risk for stress; risk for physical inactivity    Chronic Care Management from 06/22/2020 in [facility]  PHQ-9 Total Score 2     GAD 7 : Generalized Anxiety Score 06/22/2020  Nervous, Anxious, on Edge 0  Control/stop worrying 0  Worry too much - different things 0  Trouble relaxing 0  Restless 0  Easily annoyed or irritable 0  Afraid - awful might happen 0  Total GAD 7 Score 0  Anxiety Difficulty Not difficult at all    Outpatient Encounter Medications as of 10/07/2020  Medication Sig  . amLODipine (NORVASC) 10 MG tablet TAKE 1 TABLET BY MOUTH ONCE DAILY IN THE EVENING  . Blood Glucose Monitoring Suppl (ONE TOUCH ULTRA MINI) w/Device KIT 1 each by Does not apply route 2 (two) times daily.  . chlorthalidone (HYGROTON) 25 MG tablet Take 1 tablet (25 mg total) by mouth daily.  Marland Kitchen enoxaparin (LOVENOX) 120 MG/0.8ML injection Inject 0.8 mLs (120 mg total) into the skin at bedtime. Start on 09/02/20  . Ferrous Sulfate (IRON) 325 (65 Fe) MG TABS Take 1 tablet by mouth once daily with breakfast  . finasteride (PROSCAR) 5 MG tablet Take 1 tablet (5 mg total) by mouth daily.  . fluticasone (FLONASE) 50 MCG/ACT nasal spray Place 1 spray into both nostrils 2 (two) times daily as needed for  allergies or rhinitis.  Marland Kitchen glucose blood (ONE TOUCH ULTRA TEST) test strip Use to check BG once daily.  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 lisinopril (ZESTRIL) 20 MG tablet Take 1.5 tablets (30 mg total) by mouth daily.  . metFORMIN (GLUCOPHAGE) 1000 MG tablet TAKE 1 TABLET BY MOUTH TWICE DAILY WITH A MEAL (Patient taking differently: Take 500 mg by mouth 2 (two) times daily. )  . metoprolol succinate (TOPROL-XL) 100 MG 24 hr tablet TAKE 1 TABLET BY MOUTH ONCE DAILY. TAKE WITH OR IMMEDIATELY FOLLOWING A MEAL  . omeprazole (PRILOSEC) 20 MG capsule Take 1 capsule by mouth once daily  . potassium chloride SA (KLOR-CON) 20 MEQ tablet Take 1 tablet by mouth once daily  . pravastatin (PRAVACHOL) 40 MG tablet TAKE 1 TABLET BY MOUTH ONCE DAILY IN THE EVENING  . warfarin (COUMADIN) 5 MG tablet TAKE 1 & 1/2 (ONE & ONE-HALF) TABLETS BY MOUTH ONCE DAILY AT  6  PM   No facility-administered encounter medications on file as of 10/07/2020.    Goals    .  Client will talk with LCSW in next 30 days about health needs of clinet and client completion of daily activities (pt-stated)      CARE PLAN ENTRY   Current Barriers:  . Patient with chronic diagnoses of HLD, Gout, Prostate Cancer, GERD, HTN, Type 2 DM, Atrial Fibrillation, Hx DVT  Clinical Social Work Clinical Goal(s):  Marland Kitchen LCSW will call client in  next 30 days to discuss health needs of client and to discuss client completion of daily activites  Interventions:  Talked with Alan [name] about pain issues of client Talked with Alan [name] about appetite of client Talked with Alan [name] about ambulation needs of client Talked with Alan [name] about social support of client(support from son and grandchildren) Talked with Alan [name] about client completion of ADLs Talked with Alan [name] about medication procurement of client Talked with Alan [name] previously about client relaxation techniques (watches TV, repairing items at home, enjoys being outdoors, caring for yard) Discussed with Alan [name] CCM  program support . Encouraged client or Alan [name] to call RNCM as needed to discuss nursing needs of client Talked with Alan [name] about his upcoming procedure with Dr. Jeffie [name] in November of 2021. Talked with Alan [name] about sleeping issues of client Talked with Alan [name] about transport needs of client Talked with Alan [name] about vision of client Alan [name] said he recently obtained new glasses)  Patient Self Care Activities:   Completes ADLs independently Drives to needed appointments   Patient Self Care Deficits:   Slight hearing deficits  Initial goal documentation    Follow Up Plan: LCSW to call client or Alan [name], spouse of client, in next 4 weeks to discuss health needs of client and to discuss client completion of daily activities  Alan [name].Alan [name] MSW, LCSW Licensed Clinical Social Worker [facility] [phone]

## 2020-10-07 NOTE — Patient Instructions (Addendum)
Licensed Clinical Social Worker Visit Information  Goals we discussed today:   .  Client will talk with LCSW in next 30 days about health needs of clinet and client completion of daily activities (pt-stated)       CARE PLAN ENTRY   Current Barriers:   Patient with chronic diagnoses of HLD, Gout, Prostate Cancer, GERD, HTN, Type 2 DM, Atrial Fibrillation, Hx DVT  Clinical Social Work Clinical Goal(s):   LCSW will call client in next 30 days to discuss health needs of client and to discuss client completion of daily activites  Interventions:  Talked with Jenny Reichmann about pain issues of client Talked with Jenny Reichmann about appetite of client Talked with Annette about ambulation needs of client Talked with Marquice about social support of client(support from son and grandchildren) Talked with Jenny Reichmann about client completion of ADLs Talked with Jenny Reichmann about medication procurement of client Talked with Arville Go previously about client relaxation techniques (watches TV, repairing items at home, enjoys being outdoors, caring for yard) Discussed with Jenny Reichmann CCM program support . Encouraged client or Arville Go to call RNCM as needed to discuss nursing needs of client Talked with Mahki about his upcoming procedure with Dr. Jeffie Pollock in November of 2021. Talked with Jenny Reichmann about sleeping issues of client Talked with Kentarius about transport needs of client Talked with Jenny Reichmann about vision of client [place] said he recently obtained new glasses)  Patient Self Care Activities:   Completes ADLs independently Drives to needed appointments   Patient Self Care Deficits:   Slight hearing deficits  Initial goal documentation    Follow Up Plan: LCSW to call client or Nasean Zapf, spouse of client, in next 4 weeks to discuss health needs of client and to discuss client completion of daily activities  Materials Provided: No  The patient verbalized understanding of instructions provided today and declined a print copy of  patient instruction materials.   Norva Riffle.Beverlyn Mcginness MSW, LCSW Licensed Clinical Social Worker [facility] [phone]

## 2020-10-11 ENCOUNTER — Ambulatory Visit (INDEPENDENT_AMBULATORY_CARE_PROVIDER_SITE_OTHER): Payer: Medicare Other | Admitting: Family Medicine

## 2020-10-11 ENCOUNTER — Other Ambulatory Visit: Payer: Self-pay

## 2020-10-11 ENCOUNTER — Encounter: Payer: Self-pay | Admitting: Family Medicine

## 2020-10-11 VITALS — BP 147/54 | HR 58 | Temp 97.7°F | Ht 65.0 in | Wt 170.2 lb

## 2020-10-11 DIAGNOSIS — Z86718 Personal history of other venous thrombosis and embolism: Secondary | ICD-10-CM | POA: Diagnosis not present

## 2020-10-11 DIAGNOSIS — D6859 Other primary thrombophilia: Secondary | ICD-10-CM | POA: Diagnosis not present

## 2020-10-11 DIAGNOSIS — Z7901 Long term (current) use of anticoagulants: Secondary | ICD-10-CM

## 2020-10-11 DIAGNOSIS — I480 Paroxysmal atrial fibrillation: Secondary | ICD-10-CM | POA: Diagnosis not present

## 2020-10-11 LAB — COAGUCHEK XS/INR WAIVED
INR: 3.3 — ABNORMAL HIGH (ref 0.9–1.1)
Prothrombin Time: 40 s

## 2020-10-11 NOTE — Progress Notes (Signed)
BP (!) 147/54   Pulse (!) 58   Temp 97.7 F (36.5 C)   Ht '5\' 5"'  (1.651 m)   Wt 170 lb 3.2 oz (77.2 kg)   SpO2 99%   BMI 28.32 kg/m    Subjective:   Patient ID: Alan [name]., male    DOB: 1941-05-16, 79 y.o.   MRN: [id number]  HPI: Alan [name]. is a 79 y.o. male presenting on 10/11/2020 for Anticoagulation   HPI Coumadin recheck Target goal: 2.0-3.0 Reason on anticoagulation: History of DVTs and A. fib Patient denies any bruising or bleeding or chest pain or palpitations   Relevant past medical, surgical, family and social history reviewed and updated as indicated. Interim medical history since our last visit reviewed. Allergies and medications reviewed and updated.  Review of Systems  Constitutional: Negative for chills and fever.  Respiratory: Negative for shortness of breath and wheezing.   Cardiovascular: Negative for chest pain and leg swelling.  Gastrointestinal: Negative for blood in stool.  Genitourinary: Negative for hematuria.  Musculoskeletal: Negative for back pain and gait problem.  Skin: Negative for rash.  All other systems reviewed and are negative.   Per HPI unless specifically indicated above   Allergies as of 10/11/2020      Reactions   Allopurinol Other (See Comments)   Significantly decreased WBC's   Colchicine Other (See Comments)   Significantly decreased WBC's      Medication List       Accurate as of October 11, 2020  2:23 PM. If you have any questions, ask your nurse or doctor.        amLODipine 10 MG tablet Commonly known as: NORVASC TAKE 1 TABLET BY MOUTH ONCE DAILY IN THE EVENING   chlorthalidone 25 MG tablet Commonly known as: HYGROTON Take 1 tablet (25 mg total) by mouth daily.   enoxaparin 120 MG/0.8ML injection Commonly known as: Lovenox Inject 0.8 mLs (120 mg total) into the skin at bedtime. Start on 09/02/20   finasteride 5 MG tablet Commonly known as: PROSCAR Take 1 tablet (5 mg total) by mouth daily.     fluticasone 50 MCG/ACT nasal spray Commonly known as: FLONASE Place 1 spray into both nostrils 2 (two) times daily as needed for allergies or rhinitis.   glucose blood test strip Commonly known as: ONE TOUCH ULTRA TEST Use to check BG once daily.  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   metFORMIN 1000 MG tablet Commonly known as: GLUCOPHAGE TAKE 1 TABLET BY MOUTH TWICE DAILY WITH A MEAL What changed: See the new instructions.   metoprolol succinate 100 MG 24 hr tablet Commonly known as: TOPROL-XL TAKE 1 TABLET BY MOUTH ONCE DAILY. TAKE WITH OR IMMEDIATELY FOLLOWING A MEAL   omeprazole 20 MG capsule Commonly known as: PRILOSEC Take 1 capsule by mouth once daily   ONE TOUCH ULTRA MINI w/Device Kit 1 each by Does not apply route 2 (two) times daily.   potassium chloride SA 20 MEQ tablet Commonly known as: KLOR-CON Take 1 tablet by mouth once daily   pravastatin 40 MG tablet Commonly known as: PRAVACHOL TAKE 1 TABLET BY MOUTH ONCE DAILY IN THE EVENING   warfarin 5 MG tablet Commonly known as: COUMADIN Take as directed by the anticoagulation clinic. If you  are unsure how to take this medication, talk to your nurse or doctor. Original instructions: TAKE 1 & 1/2 (ONE & ONE-HALF) TABLETS BY MOUTH ONCE DAILY AT  6  PM        Objective:   BP (!) 147/54   Pulse (!) 58   Temp 97.7 F (36.5 C)   Ht '5\' 5"'  (1.651 m)   Wt 170 lb 3.2 oz (77.2 kg)   SpO2 99%   BMI 28.32 kg/m   Wt Readings from Last 3 Encounters:  10/11/20 170 lb 3.2 oz (77.2 kg)  09/21/20 171 lb (77.6 kg)  09/13/20 167 lb 2 oz (75.8 kg)    Physical Exam Vitals and nursing note reviewed.  Constitutional:      General: He is not in acute distress.    Appearance: He is well-developed. He is not diaphoretic.  Eyes:     General: No  scleral icterus.    Conjunctiva/sclera: Conjunctivae normal.  Neurological:     Mental Status: He is alert and oriented to person, place, and time.     Coordination: Coordination normal.  Psychiatric:        Behavior: Behavior normal.       Assessment & Plan:   Problem List Items Addressed This Visit      Cardiovascular and Mediastinum   Paroxysmal atrial fibrillation ([place]) - Primary   Relevant Orders   CoaguChek XS/INR Waived     Hematopoietic and Hemostatic   Primary hypercoagulable state ([place]) [D68.59]     Other   Long term (current) use of anticoagulants [Z79.01]   History of DVT (deep vein thrombosis)      Description   Decrease current dose to take 1 tablet every day of the week except for Wednesdays and Saturdays take 1 1/2 tablets INR was 3.3 (goal 2-3)  Follow-up 4 weeks     Follow up plan: Return if symptoms worsen or fail to improve, for INR recheck 4 to 5 weeks.  Counseling provided for all of the vaccine components Orders Placed This Encounter  Procedures  . CoaguChek XS/INR [place], MD [facility] 10/11/2020, 2:23 PM

## 2020-10-12 NOTE — Progress Notes (Signed)
CARDIOLOGY OFFICE NOTE  Date:  10/13/2020    [name] Date of Birth: Jun 09, 1941 Medical Record #[id number]  PCP:  Dettinger, Fransisca Kaufmann, MD  Cardiologist:  Tamala Julian   Chief Complaint  Patient presents with  . Pre-op Exam    Seen for Dr. Tamala Julian    History of Present Illness: Alan [name]. is a 79 y.o. male who presents today for a work in/pre op clearance visit today. Seen for Dr. Tamala Julian.   He has a history of HOCM, HTN, PAF - on anticoagulation and prior DVTs. 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   Last seen in November by Dr. Tamala Julian - felt to be doing ok.   Now needing clearance for GU surgery/rostate seed implant - will need bridging per pharmacy recommendations with Lovenox and hold Coumadin for 5 days. PCP manages his anticoagulation.   Comes in today. Here alone. He says he is doing well. He has no concerns. No chest pain. Not short of breath. Not dizzy or lightheaded. No syncope. No swelling. He is staying active - works outside - he can walk a few blocks and go up a flight of stairs with no problem. He notes he has done Lovenox in the past. No bleeding noted. He does note that his BP has been running higher at home.   Past Medical History:  Diagnosis Date  . Adenomatous colon polyp   . Agranulocytosis ([place])   . AKI (acute kidney injury) ([place]) 09/22/2019  . Allergy   . Atrial fibrillation ([place]) 04/19/2016  . Cataract    small  . Diabetes mellitus   . Diverticulosis   . ED (erectile dysfunction)   . 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 Gallstones   . GERD (gastroesophageal reflux disease)   . Gout   . Heart murmur   . Hyperlipidemia   . Hypertension   . Internal hemorrhoids   . Pancreatitis   . 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  . Prostate cancer ([place])    Followed by Dr. Jeffie Pollock     Past Surgical History:  Procedure Laterality Date  . CHOLECYSTECTOMY N/A 11/26/2015   Procedure: LAPAROSCOPIC  CHOLECYSTECTOMY;  Surgeon: Aviva Signs, MD;  Location: [facility] ORS;  Service: General;  Laterality: N/A;  . COLONOSCOPY  2012   [name]: moderate sigmoid diverticulosis, internal hemorrhoids. next tcs 2017  . FLEXIBLE BRONCHOSCOPY Bilateral 04/14/2016   Procedure: FLEXIBLE BRONCHOSCOPY;  Surgeon: Sinda Du, MD;  Location: [facility] ENDO SUITE;  Service: Cardiopulmonary;  Laterality: Bilateral;  . SIGMOIDOSCOPY    . UMBILICAL HERNIA REPAIR N/A 11/26/2015   Procedure: UMBILICAL HERNIORRHAPHY;  Surgeon: Aviva Signs, MD;  Location: [facility] ORS;  Service: General;  Laterality: N/A;     Medications: Current Meds  Medication Sig  . amLODipine (NORVASC) 10 MG tablet TAKE 1 TABLET BY MOUTH ONCE DAILY IN THE EVENING  . Blood Glucose Monitoring Suppl (ONE TOUCH ULTRA MINI) w/Device KIT 1 each by Does not apply route 2 (two) times daily.  . chlorthalidone (HYGROTON) 25 MG tablet Take 1 tablet (25 mg total) by mouth daily.  Marland Kitchen enoxaparin (LOVENOX) 120 MG/0.8ML injection Inject 0.8 mLs (120 mg total) into the skin at bedtime. Start on 09/02/20  . Ferrous Sulfate (IRON) 325 (65 Fe) MG TABS Take 1 tablet by mouth once daily with breakfast  . finasteride (PROSCAR) 5 MG tablet Take 1 tablet (5 mg total) by mouth daily.  . fluticasone (FLONASE) 50 MCG/ACT nasal spray Place  1 spray into both nostrils 2 (two) times daily as needed for allergies or rhinitis.  Marland Kitchen glucose blood (ONE TOUCH ULTRA TEST) test strip Use to check BG once daily.  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 lisinopril (ZESTRIL) 20 MG tablet Take 2 tablets (40 mg total) by mouth daily.  . metFORMIN (GLUCOPHAGE) 1000 MG tablet TAKE 1 TABLET BY MOUTH TWICE DAILY WITH A MEAL  . metoprolol succinate (TOPROL-XL) 100 MG 24 hr tablet TAKE 1 TABLET BY MOUTH ONCE DAILY. TAKE WITH OR IMMEDIATELY FOLLOWING A MEAL  . omeprazole (PRILOSEC) 20 MG capsule Take 1 capsule by mouth once daily  . potassium chloride SA (KLOR-CON) 20 MEQ tablet Take 1 tablet by mouth once daily   . pravastatin (PRAVACHOL) 40 MG tablet TAKE 1 TABLET BY MOUTH ONCE DAILY IN THE EVENING  . Sodium Sulfate-Mag Sulfate-KCl (SUTAB) [phone] MG TABS Take by mouth.  . warfarin (COUMADIN) 5 MG tablet TAKE 1 & 1/2 (ONE & ONE-HALF) TABLETS BY MOUTH ONCE DAILY AT  6  PM  . [DISCONTINUED] lisinopril (ZESTRIL) 20 MG tablet Take 1.5 tablets (30 mg total) by mouth daily.     Allergies: Allergies  Allergen Reactions  . Allopurinol Other (See Comments)    Significantly decreased WBC's  . Colchicine Other (See Comments)    Significantly decreased WBC's    Social History: The patient  reports that he quit smoking about 33 years ago. His smoking use included cigarettes. He has never used smokeless tobacco. He reports that he does not drink alcohol and does not use drugs.   Family History: The patient's family history includes Alzheimer's disease in his mother; Brain cancer in his sister; Breast cancer in his sister; Deep vein thrombosis in his brother; Hypertension in his brother, father, mother, and sister; Stroke in his mother.   Review of Systems: Please see the history of present illness.   All other systems are reviewed and negative.   Physical Exam: VS:  BP (!) 160/66   Pulse (!) 53   Ht '5\' 5"'  (1.651 m)   Wt 169 lb 12.8 oz (77 kg)   SpO2 99%   BMI 28.26 kg/m  .  BMI Body mass index is 28.26 kg/m.  Wt Readings from Last 3 Encounters:  10/13/20 169 lb 12.8 oz (77 kg)  10/11/20 170 lb 3.2 oz (77.2 kg)  09/21/20 171 lb (77.6 kg)   BP is 160/60 by me.   General: Pleasant. Alert and in no acute distress.   Cardiac: Regular rate and rhythm. No murmurs, rubs, or gallops. No edema.  Respiratory:  Lungs are clear to auscultation bilaterally with normal work of breathing.  GI: Soft and nontender.  MS: No deformity or atrophy. Gait and ROM intact.  Skin: Warm and dry. Color is normal.  Neuro:  Strength and sensation are intact and no gross focal deficits noted.  Psych: Alert,  appropriate and with normal affect.   LABORATORY DATA:  EKG:  EKG is ordered today.  Personally reviewed by me. This demonstrates sinus bradycardia with LVH and chronic T wave changes - this tracing is unchanged.  Lab Results  Component Value Date   WBC 5.6 06/16/2020   HGB 11.3 (L) 06/16/2020   HCT 35.2 (L) 06/16/2020   PLT 249 06/16/2020   GLUCOSE 160 (H) 06/16/2020   CHOL 104 06/16/2020   TRIG 234 (H) 06/16/2020   HDL 25 (L) 06/16/2020   LDLCALC 42 06/16/2020   ALT 14 06/16/2020   AST  22 06/16/2020   NA 139 06/16/2020   K 3.8 06/16/2020   CL 100 06/16/2020   CREATININE 1.30 (H) 07/29/2020   BUN 21 06/16/2020   CO2 26 06/16/2020   TSH 1.842 04/10/2016   PSA 6.4 (H) 05/20/2020   INR 3.3 (H) 10/11/2020   HGBA1C 7.0 (H) 06/16/2020   MICROALBUR 20 09/08/2015     BNP (last 3 results) No results for input(s): BNP in the last 8760 hours.  ProBNP (last 3 results) No results for input(s): PROBNP in the last 8760 hours.   Other Studies Reviewed Today:  2D Doppler echocardiogram 05/16/2017:  Study Conclusions  - Left ventricle: The cavity size was normal. Wall thickness was increased in a pattern of moderate LVH. There was severe asymmetric septal hypertrophy. No definite significant LVOT gradient. Systolic function was vigorous. The estimated ejection fraction was in the range of 65% to 70%. Wall motion was normal; there were no regional wall motion abnormalities. Doppler parameters are consistent with abnormal left ventricular relaxation (grade 1 diastolic dysfunction). - Aortic valve: Mildly calcified annulus. Trileaflet. There was mild regurgitation. - Mitral valve: Mildly calcified annulus. There was trivial regurgitation. - Left atrium: The atrium was mildly dilated. - Right atrium: Central venous pressure (est): 3 mm Hg. - Tricuspid valve: There was trivial regurgitation. - Pulmonary arteries: PA peak pressure: 29 mm Hg (S). -  Pericardium, extracardiac: There was no pericardial effusion. - Septal thickness 17 mm and free wall thickness 19 mmHg. Impressions:  - Moderate LVH with septal hypertrophy, no definite significant LVOT gradient, LVEF 65-70%. Grade 1 diastolic dysfunction. Mild left atrial enlargement. Mildly calcified mitral annulus with trivial mitral regurgitation. Mildly calcified aortic annulus with mild aortic regurgitation. Trivial tricuspid regurgitation with PASP 29 mmHg.   ASSESSMENT & PLAN:    1. Pre op clearance - he is felt to be an acceptable candidate for his requested procedure. He can do over 4 mets of activity. He has no worrisome symptoms. Pharmacy here has recommended that per our office protocol, he can hold warfarin for 5 days prior to procedure. Patient will need bridging with Lovenox (enoxaparin) around procedure.  Warfarin is managed by primary care and will defer Lovenox bridge to them.   2. PAF - remains in sinus.   3. Chronic anticoagulation - no problems noted - recheck surveillance labs.   4. HOCM - seems to be doing well - no worrisome symptoms. Volume status ok.   5. HTN - not controlled - increasing ACE from 30 mg to 40 mg today. Lab today. Will see him back in January as planned.   6. HLD - on statin  7. History of DVTs - on anticoagulation  8. DM - per PCP  Current medicines are reviewed with the patient today.  The patient does not have concerns regarding medicines other than what has been noted above.  The following changes have been made:  See above.  Labs/ tests ordered today include:    Orders Placed This Encounter  Procedures  . Basic metabolic panel  . CBC  . EKG 12-Lead     Disposition:   FU with Dr. Tamala Julian in early January as planned.    Patient is agreeable to this plan and will call if any problems develop in the interim.   SignedTruitt Merle, NP  10/13/2020 10:59 AM  [facility] [address] [place], [place]  [address] Phone: [phone] Fax: [phone]

## 2020-10-13 ENCOUNTER — Ambulatory Visit: Payer: Medicare Other | Admitting: Nurse Practitioner

## 2020-10-13 ENCOUNTER — Other Ambulatory Visit: Payer: Self-pay

## 2020-10-13 ENCOUNTER — Encounter: Payer: Self-pay | Admitting: Nurse Practitioner

## 2020-10-13 VITALS — BP 160/66 | HR 53 | Ht 65.0 in | Wt 169.8 lb

## 2020-10-13 DIAGNOSIS — E1159 Type 2 diabetes mellitus with other circulatory complications: Secondary | ICD-10-CM

## 2020-10-13 DIAGNOSIS — Z7901 Long term (current) use of anticoagulants: Secondary | ICD-10-CM | POA: Diagnosis not present

## 2020-10-13 DIAGNOSIS — I152 Hypertension secondary to endocrine disorders: Secondary | ICD-10-CM | POA: Diagnosis not present

## 2020-10-13 DIAGNOSIS — Z01810 Encounter for preprocedural cardiovascular examination: Secondary | ICD-10-CM | POA: Diagnosis not present

## 2020-10-13 DIAGNOSIS — I480 Paroxysmal atrial fibrillation: Secondary | ICD-10-CM | POA: Diagnosis not present

## 2020-10-13 LAB — BASIC METABOLIC PANEL
BUN/Creatinine Ratio: 13 (ref 10–24)
BUN: 14 mg/dL (ref 8–27)
CO2: 25 mmol/L (ref 20–29)
Calcium: 9.4 mg/dL (ref 8.6–10.2)
Chloride: 100 mmol/L (ref 96–106)
Creatinine, Ser: 1.05 mg/dL (ref 0.76–1.27)
GFR calc Af Amer: 79 mL/min/{1.73_m2} (ref >59)
GFR calc non Af Amer: 68 mL/min/{1.73_m2} (ref >59)
Glucose: 172 mg/dL — ABNORMAL HIGH (ref 65–99)
Potassium: 3.1 mmol/L — ABNORMAL LOW (ref 3.5–5.2)
Sodium: 139 mmol/L (ref 134–144)

## 2020-10-13 LAB — CBC
Hematocrit: 33.3 % — ABNORMAL LOW (ref 37.5–51.0)
Hemoglobin: 11.3 g/dL — ABNORMAL LOW (ref 13.0–17.7)
MCH: 30.7 pg (ref 26.6–33.0)
MCHC: 33.9 g/dL (ref 31.5–35.7)
MCV: 91 fL (ref 79–97)
Platelets: 247 10*3/uL (ref 150–450)
RBC: 3.68 x10E6/uL — ABNORMAL LOW (ref 4.14–5.80)
RDW: 13.1 % (ref 11.6–15.4)
WBC: 9.2 10*3/uL (ref 3.4–10.8)

## 2020-10-13 MED ORDER — LISINOPRIL 20 MG PO TABS: 40.0000 mg | ORAL_TABLET | Freq: Every day | ORAL | 0 refills | Status: DC

## 2020-10-13 NOTE — Patient Instructions (Addendum)
After Visit Summary:  We will be checking the following labs today - BMET & CBC   Medication Instructions:    Continue with your current medicines. BUT  I am doing to increase the Lisinopril to 40 mg a day (this will be an increase from the pill and a half to 2 pills)   If you need a refill on your cardiac medications before your next appointment, please call your pharmacy.     Testing/Procedures To Be Arranged:  N/A  Follow-Up:   See Dr. Tamala Julian as planned.     At [facility], you and your health needs are our priority.  As part of our continuing mission to provide you with exceptional heart care, we have created designated Provider Care Teams.  These Care Teams include your primary Cardiologist (physician) and Advanced Practice Providers (APPs -  Physician Assistants and Nurse Practitioners) who all work together to provide you with the care you need, when you need it.  Special Instructions:  . Stay safe, wash your hands for at least 20 seconds and wear a mask when needed.  . It was good to talk with you today.  . I will send a note to Dr. Jeffie Pollock about your upcoming surgery. . You will need to have the Lovenox shots for this upcoming surgery.    Call the [place] office at [phone] if you have any questions, problems or concerns.

## 2020-10-14 ENCOUNTER — Other Ambulatory Visit: Payer: Self-pay | Admitting: *Deleted

## 2020-10-14 DIAGNOSIS — E876 Hypokalemia: Secondary | ICD-10-CM

## 2020-10-15 ENCOUNTER — Ambulatory Visit: Payer: Medicare Other | Admitting: Urology

## 2020-10-18 NOTE — H&P (View-Only) (Signed)
Subjective:  No diagnosis found.   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 On initial biopsy, he had only 5% in one core and elected active survelliance. He had a repeat biopsy on 12/24/13 that showed a single core with <5% Gleason 6 in the right mid lateral prostate. His last biopsy was on 06/28/18 and only showed 2 cores with atypia. He is on finasteride and has had  a slowly rising PSA that was up to 6.4 prior to his most recent biopsy on 06/25/20.  The nadir on finasteride was 1.8.    The repeat biopsy demonstrated 6 positive cores with 2 cores with 7(4+3) on the right and 4 cores with low volume Gleason 6 disease.    His prostate volume is 41m. IPSS is 9.  A bone scan on 07/06/20 was negative.    The CT on 07/29/20 was negative for mets.   His PSA was 6.2 prior to therapy starting finasteride.   He has elected to have a seed implant.  He has had no changes in his medications and his UA today is clear.   He is on warfarin and will hold that for the procedure but bridge with Lovenox.      ROS:  ROS:  A complete review of systems was performed.  All systems are negative except for pertinent findings as noted.   ROS  Allergies  Allergen Reactions  . Allopurinol Other (See Comments)    Significantly decreased WBC's  . Colchicine Other (See Comments)    Significantly decreased WBC's    Outpatient Encounter Medications as of 10/19/2020  Medication Sig  . amLODipine (NORVASC) 10 MG tablet TAKE 1 TABLET BY MOUTH ONCE DAILY IN THE EVENING  . Blood Glucose Monitoring Suppl (ONE TOUCH ULTRA MINI) w/Device KIT 1 each by Does not apply route 2 (two) times daily.  . chlorthalidone (HYGROTON) 25 MG tablet Take 1 tablet (25 mg total) by mouth daily.  .Marland Kitchenenoxaparin (LOVENOX) 120 MG/0.8ML injection Inject 0.8 mLs (120 mg total) into the skin at bedtime. Start on 09/02/20   . Ferrous Sulfate (IRON) 325 (65 Fe) MG TABS Take 1 tablet by mouth once daily with breakfast  . finasteride (PROSCAR) 5 MG tablet Take 1 tablet (5 mg total) by mouth daily.  . fluticasone (FLONASE) 50 MCG/ACT nasal spray Place 1 spray into both nostrils 2 (two) times daily as needed for allergies or rhinitis.  .Marland Kitchenglucose blood (ONE TOUCH ULTRA TEST) test strip Use to check BG once daily.  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 lisinopril (ZESTRIL) 20 MG tablet Take 2 tablets (40 mg total) by mouth daily.  . metFORMIN (GLUCOPHAGE) 1000 MG tablet TAKE 1 TABLET BY MOUTH TWICE DAILY WITH A MEAL  . metoprolol succinate (TOPROL-XL) 100 MG 24 hr tablet TAKE 1 TABLET BY MOUTH ONCE DAILY. TAKE WITH OR IMMEDIATELY FOLLOWING A MEAL  . omeprazole (PRILOSEC) 20 MG capsule Take 1 capsule by mouth once daily  . potassium chloride SA (KLOR-CON) 20 MEQ tablet Take 1 tablet by mouth once daily  . pravastatin (PRAVACHOL) 40 MG tablet TAKE 1 TABLET BY MOUTH ONCE DAILY IN THE EVENING  . Sodium Sulfate-Mag Sulfate-KCl (SUTAB) 1515-510-4725MG TABS Take by mouth.  . warfarin (COUMADIN) 5 MG tablet TAKE 1 & 1/2 (ONE & ONE-HALF) TABLETS BY MOUTH ONCE DAILY AT  6  PM  No facility-administered encounter medications on file as of 10/19/2020.    Past Medical History:  Diagnosis Date  . Adenomatous colon polyp   . Agranulocytosis ([place])   . AKI (acute kidney injury) ([place]) 09/22/2019  . Allergy   . Atrial fibrillation ([place]) 04/19/2016  . Cataract    small  . Diabetes mellitus   . Diverticulosis   . ED (erectile dysfunction)   . 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 Gallstones   . GERD (gastroesophageal reflux disease)   . Gout   . Heart murmur   . Hyperlipidemia   . Hypertension   . Internal hemorrhoids   . Pancreatitis   . 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  . Prostate cancer ([place])    Followed by Dr. Jeffie Pollock     Past Surgical History:  Procedure Laterality Date  .  CHOLECYSTECTOMY N/A 11/26/2015   Procedure: LAPAROSCOPIC CHOLECYSTECTOMY;  Surgeon: Aviva Signs, MD;  Location: [facility];  Service: General;  Laterality: N/A;  . COLONOSCOPY  2012   [name]: moderate sigmoid diverticulosis, internal hemorrhoids. next tcs 2017  . FLEXIBLE BRONCHOSCOPY Bilateral 04/14/2016   Procedure: FLEXIBLE BRONCHOSCOPY;  Surgeon: Sinda Du, MD;  Location: [facility];  Service: Cardiopulmonary;  Laterality: Bilateral;  . SIGMOIDOSCOPY    . UMBILICAL HERNIA REPAIR N/A 11/26/2015   Procedure: UMBILICAL HERNIORRHAPHY;  Surgeon: Aviva Signs, MD;  Location: [facility];  Service: General;  Laterality: N/A;    Social History   Socioeconomic History  . Marital status: Married    Spouse name: Arville Go  . Number of children: 4  . Years of education: Not on file  . Highest education level: 10th grade  Occupational History  . Occupation: Retired     Fish farm manager: [employer]  Tobacco Use  . Smoking status: Former Smoker    Types: Cigarettes    Quit date: 12/11/1986    Years since quitting: 33.8  . Smokeless tobacco: Never Used  . Tobacco comment: Quit x 50 years  Vaping Use  . Vaping Use: Never used  Substance and Sexual Activity  . Alcohol use: No  . Drug use: No  . Sexual activity: Yes  Other Topics Concern  . Not on file  Social History Narrative  . Not on file   Social Determinants of Health   Financial Resource Strain:   . Difficulty of Paying Living Expenses: Not on file  Food Insecurity:   . Worried About Charity fundraiser in the Last Year: Not on file  . Ran Out of Food in the Last Year: Not on file  Transportation Needs:   . Lack of Transportation (Medical): Not on file  . Lack of Transportation (Non-Medical): Not on file  Physical Activity:   . Days of Exercise per Week: Not on file  . Minutes of Exercise per Session: Not on file  Stress:   . Feeling of Stress : Not on file  Social Connections:   . Frequency of Communication with Friends and  Family: Not on file  . Frequency of Social Gatherings with Friends and Family: Not on file  . Attends Religious Services: Not on file  . Active Member of Clubs or Organizations: Not on file  . Attends Archivist Meetings: Not on file  . Marital Status: Not on file  Intimate Partner Violence:   . Fear of Current or Ex-Partner: Not on file  . Emotionally Abused: Not on file  . Physically Abused: Not on file  .  Sexually Abused: Not on file    Family History  Problem Relation Age of Onset  . Stroke Mother   . Hypertension Mother   . Alzheimer's disease Mother   . Hypertension Father   . Brain cancer Sister   . Breast cancer Sister   . Hypertension Sister   . Deep vein thrombosis Brother   . Hypertension Brother   . Colon cancer Neg Hx   . Pancreatic disease Neg Hx   . Esophageal cancer Neg Hx   . Rectal cancer Neg Hx   . Stomach cancer Neg Hx        Objective: Vitals:   10/19/20 1516  BP: (!) 161/65  Pulse: 64  Temp: 98.4 F (36.9 C)     Physical Exam Vitals reviewed.  Constitutional:      Appearance: Normal appearance.  Cardiovascular:     Rate and Rhythm: Normal rate and regular rhythm.     Pulses: Normal pulses.     Heart sounds: Normal heart sounds.  Pulmonary:     Effort: Pulmonary effort is normal.     Breath sounds: Normal breath sounds.  Neurological:     Mental Status: He is alert.     Lab Results:  No results found for this or any previous visit (from the past 24 hour(s)).  BMET No results for input(s): NA, K, CL, CO2, GLUCOSE, BUN, CREATININE, CALCIUM in the last 72 hours. PSA PSA  Date Value Ref Range Status  05/20/2020 6.4 (H) < OR = 4.0 ng/mL Final    Comment:    The total PSA value from this assay system is  standardized against the WHO standard. The test  result will be approximately 20% lower when compared  to the equimolar-standardized total PSA (Beckman  Coulter). Comparison of serial PSA results should be  interpreted  with this fact in mind. . This test was performed using the Siemens  chemiluminescent method. Values obtained from  different assay methods cannot be used interchangeably. PSA levels, regardless of value, should not be interpreted as absolute evidence of the presence or absence of disease.   11/12/2014 2.0 0.0 - 4.0 ng/mL Final    Comment:    Roche ECLIA methodology. According to the American Urological Association, Serum PSA should decrease and remain at undetectable levels after radical prostatectomy. The AUA defines biochemical recurrence as an initial PSA value 0.2 ng/mL or greater followed by a subsequent confirmatory PSA value 0.2 ng/mL or greater. Values obtained with different assay methods or kits cannot be used interchangeably. Results cannot be interpreted as absolute evidence of the presence or absence of malignant disease.    No results found for: TESTOSTERONE    Studies/Results: No results found. NUCLEAR MEDICINE WHOLE BODY BONE SCAN  TECHNIQUE: Whole body anterior and posterior images were obtained approximately 3 hours after intravenous injection of radiopharmaceutical.  RADIOPHARMACEUTICALS:  19.53 mCi Technetium-32mMDP IV  COMPARISON:  July 12, 2016.  FINDINGS: No suspicious focal radiotracer uptake.  There is radiotracer uptake within bilateral mid feet, knees, the RIGHT greater than LEFT elbow, the sternoclavicular joints and bilateral shoulders. This is favored to be degenerative in etiology. Expected physiologic distribution of radiotracer.  IMPRESSION: No scintigraphic evidence of osseous metastatic disease.   Electronically Signed   By: SValentino SaxonMD   On: 07/07/2020 08:17   Assessment & Plan: Prostate cancer.  He has progressed and has T2b N0 M0  Gleason 7(4+3) disease with a PSA that is rising on finasteride and right  prostate induration.   He has elected to have a seed implant and is scheduled for 11/09/20.  He will  hold the warfarin and bridge with lovenox as instructed.  Nodular prostate with obstruction and nocturia.  He will continue the finasteride.    No orders of the defined types were placed in this encounter.    No orders of the defined types were placed in this encounter.     Return for Needs f/u 2-3 weeks after his seed implant on 11/30. Marland Kitchen   CC: Dettinger, Fransisca Kaufmann, MD      Irine Seal 10/19/2020

## 2020-10-18 NOTE — Progress Notes (Signed)
Subjective:  No diagnosis found.   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 On initial biopsy, he had only 5% in one core and elected active survelliance. He had a repeat biopsy on 12/24/13 that showed a single core with <5% Gleason 6 in the right mid lateral prostate. His last biopsy was on 06/28/18 and only showed 2 cores with atypia. He is on finasteride and has had  a slowly rising PSA that was up to 6.4 prior to his most recent biopsy on 06/25/20.  The nadir on finasteride was 1.8.    The repeat biopsy demonstrated 6 positive cores with 2 cores with 7(4+3) on the right and 4 cores with low volume Gleason 6 disease.    His prostate volume is 34m. IPSS is 9.  A bone scan on 07/06/20 was negative.    The CT on 07/29/20 was negative for mets.   His PSA was 6.2 prior to therapy starting finasteride.   He has elected to have a seed implant.  He has had no changes in his medications and his UA today is clear.   He is on warfarin and will hold that for the procedure but bridge with Lovenox.      ROS:  ROS:  A complete review of systems was performed.  All systems are negative except for pertinent findings as noted.   ROS  Allergies  Allergen Reactions  . Allopurinol Other (See Comments)    Significantly decreased WBC's  . Colchicine Other (See Comments)    Significantly decreased WBC's    Outpatient Encounter Medications as of 10/19/2020  Medication Sig  . amLODipine (NORVASC) 10 MG tablet TAKE 1 TABLET BY MOUTH ONCE DAILY IN THE EVENING  . Blood Glucose Monitoring Suppl (ONE TOUCH ULTRA MINI) w/Device KIT 1 each by Does not apply route 2 (two) times daily.  . chlorthalidone (HYGROTON) 25 MG tablet Take 1 tablet (25 mg total) by mouth daily.  .Marland Kitchenenoxaparin (LOVENOX) 120 MG/0.8ML injection Inject 0.8 mLs (120 mg total) into the skin at bedtime. Start on 09/02/20   . Ferrous Sulfate (IRON) 325 (65 Fe) MG TABS Take 1 tablet by mouth once daily with breakfast  . finasteride (PROSCAR) 5 MG tablet Take 1 tablet (5 mg total) by mouth daily.  . fluticasone (FLONASE) 50 MCG/ACT nasal spray Place 1 spray into both nostrils 2 (two) times daily as needed for allergies or rhinitis.  .Marland Kitchenglucose blood (ONE TOUCH ULTRA TEST) test strip Use to check BG once daily.  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 lisinopril (ZESTRIL) 20 MG tablet Take 2 tablets (40 mg total) by mouth daily.  . metFORMIN (GLUCOPHAGE) 1000 MG tablet TAKE 1 TABLET BY MOUTH TWICE DAILY WITH A MEAL  . metoprolol succinate (TOPROL-XL) 100 MG 24 hr tablet TAKE 1 TABLET BY MOUTH ONCE DAILY. TAKE WITH OR IMMEDIATELY FOLLOWING A MEAL  . omeprazole (PRILOSEC) 20 MG capsule Take 1 capsule by mouth once daily  . potassium chloride SA (KLOR-CON) 20 MEQ tablet Take 1 tablet by mouth once daily  . pravastatin (PRAVACHOL) 40 MG tablet TAKE 1 TABLET BY MOUTH ONCE DAILY IN THE EVENING  . Sodium Sulfate-Mag Sulfate-KCl (SUTAB) [id number] TABS Take by mouth.  . warfarin (COUMADIN) 5 MG tablet TAKE 1 & 1/2 (ONE & ONE-HALF) TABLETS BY MOUTH ONCE DAILY AT  6  PM  No facility-administered encounter medications on file as of 10/19/2020.    Past Medical History:  Diagnosis Date  . Adenomatous colon polyp   . Agranulocytosis ([place])   . AKI (acute kidney injury) ([place]) 09/22/2019  . Allergy   . Atrial fibrillation ([place]) 04/19/2016  . Cataract    small  . Diabetes mellitus   . Diverticulosis   . ED (erectile dysfunction)   . 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 Gallstones   . GERD (gastroesophageal reflux disease)   . Gout   . Heart murmur   . Hyperlipidemia   . Hypertension   . Internal hemorrhoids   . Pancreatitis   . 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  . Prostate cancer ([place])    Followed by Dr. Jeffie Pollock     Past Surgical History:  Procedure Laterality Date  .  CHOLECYSTECTOMY N/A 11/26/2015   Procedure: LAPAROSCOPIC CHOLECYSTECTOMY;  Surgeon: Aviva Signs, MD;  Location: [facility];  Service: General;  Laterality: N/A;  . COLONOSCOPY  2012   Alan [name]: moderate sigmoid diverticulosis, internal hemorrhoids. next tcs 2017  . FLEXIBLE BRONCHOSCOPY Bilateral 04/14/2016   Procedure: FLEXIBLE BRONCHOSCOPY;  Surgeon: Sinda Du, MD;  Location: [facility];  Service: Cardiopulmonary;  Laterality: Bilateral;  . SIGMOIDOSCOPY    . UMBILICAL HERNIA REPAIR N/A 11/26/2015   Procedure: UMBILICAL HERNIORRHAPHY;  Surgeon: Aviva Signs, MD;  Location: [facility];  Service: General;  Laterality: N/A;    Social History   Socioeconomic History  . Marital status: Married    Spouse name: Alan [name]  . Number of children: 4  . Years of education: Not on file  . Highest education level: 10th grade  Occupational History  . Occupation: Retired     Fish farm manager: [employer]  Tobacco Use  . Smoking status: Former Smoker    Types: Cigarettes    Quit date: 12/11/1986    Years since quitting: 33.8  . Smokeless tobacco: Never Used  . Tobacco comment: Quit x 50 years  Vaping Use  . Vaping Use: Never used  Substance and Sexual Activity  . Alcohol use: No  . Drug use: No  . Sexual activity: Yes  Other Topics Concern  . Not on file  Social History Narrative  . Not on file   Social Determinants of Health   Financial Resource Strain:   . Difficulty of Paying Living Expenses: Not on file  Food Insecurity:   . Worried About Charity fundraiser in the Last Year: Not on file  . Ran Out of Food in the Last Year: Not on file  Transportation Needs:   . Lack of Transportation (Medical): Not on file  . Lack of Transportation (Non-Medical): Not on file  Physical Activity:   . Days of Exercise per Week: Not on file  . Minutes of Exercise per Session: Not on file  Stress:   . Feeling of Stress : Not on file  Social Connections:   . Frequency of Communication with Friends and  Family: Not on file  . Frequency of Social Gatherings with Friends and Family: Not on file  . Attends Religious Services: Not on file  . Active Member of Clubs or Organizations: Not on file  . Attends Archivist Meetings: Not on file  . Marital Status: Not on file  Intimate Partner Violence:   . Fear of Current or Ex-Partner: Not on file  . Emotionally Abused: Not on file  . Physically Abused: Not on file  .  Sexually Abused: Not on file    Family History  Problem Relation Age of Onset  . Stroke Mother   . Hypertension Mother   . Alzheimer's disease Mother   . Hypertension Father   . Brain cancer Sister   . Breast cancer Sister   . Hypertension Sister   . Deep vein thrombosis Brother   . Hypertension Brother   . Colon cancer Neg Hx   . Pancreatic disease Neg Hx   . Esophageal cancer Neg Hx   . Rectal cancer Neg Hx   . Stomach cancer Neg Hx        Objective: Vitals:   10/19/20 1516  BP: (!) 161/65  Pulse: 64  Temp: 98.4 F (36.9 C)     Physical Exam Vitals reviewed.  Constitutional:      Appearance: Normal appearance.  Cardiovascular:     Rate and Rhythm: Normal rate and regular rhythm.     Pulses: Normal pulses.     Heart sounds: Normal heart sounds.  Pulmonary:     Effort: Pulmonary effort is normal.     Breath sounds: Normal breath sounds.  Neurological:     Mental Status: He is alert.     Lab Results:  No results found for this or any previous visit (from the past 24 hour(s)).  BMET No results for input(s): NA, K, CL, CO2, GLUCOSE, BUN, CREATININE, CALCIUM in the last 72 hours. PSA PSA  Date Value Ref Range Status  05/20/2020 6.4 (H) < OR = 4.0 ng/mL Final    Comment:    The total PSA value from this assay system is  standardized against the WHO standard. The test  result will be approximately 20% lower when compared  to the equimolar-standardized total PSA (Beckman  Coulter). Comparison of serial PSA results should be  interpreted  with this fact in mind. . This test was performed using the Siemens  chemiluminescent method. Values obtained from  different assay methods cannot be used interchangeably. PSA levels, regardless of value, should not be interpreted as absolute evidence of the presence or absence of disease.   11/12/2014 2.0 0.0 - 4.0 ng/mL Final    Comment:    Roche ECLIA methodology. According to the American Urological Association, Serum PSA should decrease and remain at undetectable levels after radical prostatectomy. The AUA defines biochemical recurrence as an initial PSA value 0.2 ng/mL or greater followed by a subsequent confirmatory PSA value 0.2 ng/mL or greater. Values obtained with different assay methods or kits cannot be used interchangeably. Results cannot be interpreted as absolute evidence of the presence or absence of malignant disease.    No results found for: TESTOSTERONE    Studies/Results: No results found. NUCLEAR MEDICINE WHOLE BODY BONE SCAN  TECHNIQUE: Whole body anterior and posterior images were obtained approximately 3 hours after intravenous injection of radiopharmaceutical.  RADIOPHARMACEUTICALS:  19.53 mCi Technetium-31mMDP IV  COMPARISON:  July 12, 2016.  FINDINGS: No suspicious focal radiotracer uptake.  There is radiotracer uptake within bilateral mid feet, knees, the RIGHT greater than LEFT elbow, the sternoclavicular joints and bilateral shoulders. This is favored to be degenerative in etiology. Expected physiologic distribution of radiotracer.  IMPRESSION: No scintigraphic evidence of osseous metastatic disease.   Electronically Signed   By: SValentino SaxonMD   On: 07/07/2020 08:17   Assessment & Plan: Prostate cancer.  He has progressed and has T2b N0 M0  Gleason 7(4+3) disease with a PSA that is rising on finasteride and right  prostate induration.   He has elected to have a seed implant and is scheduled for 11/09/20.  He will  hold the warfarin and bridge with lovenox as instructed.  Nodular prostate with obstruction and nocturia.  He will continue the finasteride.    No orders of the defined types were placed in this encounter.    No orders of the defined types were placed in this encounter.     Return for Needs f/u 2-3 weeks after his seed implant on 11/30. Marland Kitchen   CC: Dettinger, Fransisca Kaufmann, MD      Irine Seal 10/19/2020

## 2020-10-19 ENCOUNTER — Encounter: Payer: Self-pay | Admitting: Urology

## 2020-10-19 ENCOUNTER — Ambulatory Visit (INDEPENDENT_AMBULATORY_CARE_PROVIDER_SITE_OTHER): Payer: Medicare Other | Admitting: Urology

## 2020-10-19 ENCOUNTER — Other Ambulatory Visit: Payer: Self-pay

## 2020-10-19 VITALS — BP 161/65 | HR 64 | Temp 98.4°F | Wt 170.0 lb

## 2020-10-19 DIAGNOSIS — C61 Malignant neoplasm of prostate: Secondary | ICD-10-CM | POA: Diagnosis not present

## 2020-10-19 DIAGNOSIS — R351 Nocturia: Secondary | ICD-10-CM

## 2020-10-19 DIAGNOSIS — N403 Nodular prostate with lower urinary tract symptoms: Secondary | ICD-10-CM | POA: Diagnosis not present

## 2020-10-19 LAB — URINALYSIS, ROUTINE W REFLEX MICROSCOPIC
Bilirubin, UA: NEGATIVE
Glucose, UA: NEGATIVE
Ketones, UA: NEGATIVE
Leukocytes,UA: NEGATIVE
Nitrite, UA: NEGATIVE
Protein,UA: NEGATIVE
RBC, UA: NEGATIVE
Specific Gravity, UA: 1.015 (ref 1.005–1.030)
Urobilinogen, Ur: 0.2 mg/dL (ref 0.2–1.0)
pH, UA: 6 (ref 5.0–7.5)

## 2020-10-19 NOTE — Addendum Note (Signed)
Addended by: Iris Pert on: 10/19/2020 03:44 PM   Modules accepted: Orders

## 2020-10-19 NOTE — Progress Notes (Signed)
Urological Symptom Review none  Review of Systems  Gastrointestinal (upper)  : Negative for upper GI symptoms  Gastrointestinal (lower) : Negative for lower GI symptoms  Constitutional : Negative for symptoms  Skin: Negative for skin symptoms  Eyes: Negative for eye symptoms  Ear/Nose/Throat : Negative for Ear/Nose/Throat symptoms  Hematologic/Lymphatic: Negative for Hematologic/Lymphatic symptoms  Cardiovascular : Negative for cardiovascular symptoms  Respiratory : Negative for respiratory symptoms  Endocrine: Negative for endocrine symptoms  Musculoskeletal: Negative for musculoskeletal symptoms  Neurological: Negative for neurological symptoms  Psychologic: Negative for psychiatric symptoms

## 2020-10-21 ENCOUNTER — Other Ambulatory Visit: Payer: Medicare Other

## 2020-10-23 ENCOUNTER — Other Ambulatory Visit: Payer: Self-pay | Admitting: Family Medicine

## 2020-10-23 DIAGNOSIS — E1169 Type 2 diabetes mellitus with other specified complication: Secondary | ICD-10-CM

## 2020-10-25 ENCOUNTER — Other Ambulatory Visit: Payer: Medicare Other

## 2020-10-25 ENCOUNTER — Other Ambulatory Visit: Payer: Self-pay

## 2020-10-25 DIAGNOSIS — E876 Hypokalemia: Secondary | ICD-10-CM | POA: Diagnosis not present

## 2020-10-25 LAB — BASIC METABOLIC PANEL
BUN/Creatinine Ratio: 15 (ref 10–24)
BUN: 18 mg/dL (ref 8–27)
CO2: 23 mmol/L (ref 20–29)
Calcium: 9.2 mg/dL (ref 8.6–10.2)
Chloride: 100 mmol/L (ref 96–106)
Creatinine, Ser: 1.21 mg/dL (ref 0.76–1.27)
GFR calc Af Amer: 66 mL/min/{1.73_m2} (ref >59)
GFR calc non Af Amer: 57 mL/min/{1.73_m2} — ABNORMAL LOW (ref >59)
Glucose: 299 mg/dL — ABNORMAL HIGH (ref 65–99)
Potassium: 3.5 mmol/L (ref 3.5–5.2)
Sodium: 138 mmol/L (ref 134–144)

## 2020-10-27 ENCOUNTER — Telehealth: Payer: Self-pay | Admitting: *Deleted

## 2020-10-27 NOTE — Telephone Encounter (Signed)
CALLED PATIENT TO REMIND OF LAB AND COVID TESTING FOR 11-08-20, SPOKE WITH PATIENT AND HE IS AWARE OF THESE APPTS.

## 2020-10-29 ENCOUNTER — Ambulatory Visit: Payer: Medicare Other | Admitting: Urology

## 2020-11-01 ENCOUNTER — Other Ambulatory Visit: Payer: Self-pay | Admitting: Family Medicine

## 2020-11-01 DIAGNOSIS — E1169 Type 2 diabetes mellitus with other specified complication: Secondary | ICD-10-CM

## 2020-11-01 DIAGNOSIS — E1159 Type 2 diabetes mellitus with other circulatory complications: Secondary | ICD-10-CM

## 2020-11-01 DIAGNOSIS — I152 Hypertension secondary to endocrine disorders: Secondary | ICD-10-CM

## 2020-11-01 DIAGNOSIS — E785 Hyperlipidemia, unspecified: Secondary | ICD-10-CM

## 2020-11-02 ENCOUNTER — Telehealth: Payer: Self-pay

## 2020-11-02 ENCOUNTER — Ambulatory Visit (INDEPENDENT_AMBULATORY_CARE_PROVIDER_SITE_OTHER): Payer: Medicare Other | Admitting: Pharmacist

## 2020-11-02 ENCOUNTER — Encounter (HOSPITAL_BASED_OUTPATIENT_CLINIC_OR_DEPARTMENT_OTHER): Payer: Self-pay | Admitting: Urology

## 2020-11-02 ENCOUNTER — Other Ambulatory Visit: Payer: Self-pay

## 2020-11-02 DIAGNOSIS — Z86718 Personal history of other venous thrombosis and embolism: Secondary | ICD-10-CM

## 2020-11-02 DIAGNOSIS — I480 Paroxysmal atrial fibrillation: Secondary | ICD-10-CM

## 2020-11-02 LAB — COAGUCHEK XS/INR WAIVED
INR: 2.1 — ABNORMAL HIGH (ref 0.9–1.1)
Prothrombin Time: 25.6 s

## 2020-11-02 MED ORDER — ENOXAPARIN SODIUM 120 MG/0.8ML ~~LOC~~ SOLN: 120.0000 mg | Freq: Every evening | SUBCUTANEOUS | 0 refills | Status: DC

## 2020-11-02 NOTE — Progress Notes (Signed)
°  ° °  S:  39 yoM Presents for INR/coumadin management.  Patient has urologic procedure on 11/09/2020.  INR today is 2.1 Today's weight 170lb Patient prefers once daily dosing for lovenox.  Bridge plan for patient is as follows:   11/02/2020: take coumadin as directed  11/03/2020: take coumadin as directed  11/04/2020: Inject Lovenox 120mg  in the fatty abdominal tissue at least 2 inches from the belly button once daily at 11 PM. Rotate sites. No Coumadin.   11/05/2020: Inject Lovenox 120mg  in the fatty tissue every once daily at 11 PM. No Coumadin.   11/06/2020: Inject Lovenox 120mg  in the fatty tissue every once daily at 11 PM. No Coumadin.   11/07/2020: Inject Lovenox 120mg  in the fatty tissue every once daily at 11 PM. No Coumadin.   11/08/2020: No lovenox. No Coumadin.   11/09/2020: Procedure Day (11:30 AM) - No Lovenox - Resume Coumadin in the evening or as directed by doctor (take an extra half tablet with usual dose for 2 days then resume normal dose).   11/10/2020: Inject Lovenox 120mg  in the fatty tissue every once daily and take Coumadin.   11/11/2020: Inject Lovenox 120mg  in the fatty tissue every once daily and take Coumadin.   11/12/2020: Inject Lovenox 120mg  in the fatty tissue every once daily and take Coumadin.   11/13/2020: Inject Lovenox 120mg  in the fatty tissue every once daily and take Coumadin.   11/14/2020: Inject Lovenox 120mg  in the fatty tissue every once daily and take Coumadin.    11/15/2020: Follow up pharmacist at [place] appt at [facility]    Written patient instructions provided.  Total time in face to face counseling 15 minutes.   Follow up PCP Clinic Visit ON 11/15/20.   Regina Eck, PharmD, BCPS Clinical Pharmacist, [place]  II Phone [phone]

## 2020-11-02 NOTE — Telephone Encounter (Signed)
Patient has #2 enox 120mg  syringes at home that are still good He will pick up the remainder #7 syringes for $91  Patient verbalizes understanding

## 2020-11-03 ENCOUNTER — Encounter (HOSPITAL_BASED_OUTPATIENT_CLINIC_OR_DEPARTMENT_OTHER): Payer: Self-pay | Admitting: Urology

## 2020-11-03 ENCOUNTER — Other Ambulatory Visit: Payer: Self-pay

## 2020-11-03 NOTE — Progress Notes (Addendum)
ADDENDUM:  Chart reviewed by anesthesia, Konrad Felix PA, stated ok to proceed with clearance.   Spoke w/ via phone for pre-op interview--- PT Lab needs dos---- no              Lab results------ pt getting CBC, CMP, PT/PTT done 11-08-2020 @ 0900;  Current cxr/ ekg in epic/ chart COVID test ------ 11-08-2020 @ 1100 Arrive at ------- 0730 NPO after MN NO Solid Food.  Clear liquids from MN until--- 0630 Medications to take morning of surgery ----- Toprol, Prilosec, Proscar Diabetic medication ----- do not take metformin morning of surgery Patient Special Instructions ----- will do one fleet enema morning of surgery.   Pre-Op special Istructions ----- pt has cardiac clearance w/ lov 10-13-2020 by Truitt Merle NP, in epic/ chart.  Pt was given coumadin/ lovenox bridging instructions from his pcp office pharmasit, Lottie Dawson [facility] (notes in epic) Patient verbalized understanding of instructions that were given at this phone interview. Patient denies shortness of breath, chest pain, fever, cough at this phone interview.   Anesthesia Review:  HTN, PAF, HOCM, hx DVT lower extremity yrs ago,  Chronic thrombus portal venous.  Pt denies any cardiac s&s, sob, and no peripheral swelling.  Chart to be reviewed by anesthesia, Konrad Felix PA.  PCP:  Dr Lenna Sciara. [place]ting[place] (lov 10-11-2020 epic) Cardiologist :  Dr Linard Millers (lov 10-13-2020 epic) Chest x-ray :  09-30-2020 epic EKG : 10-13-2020 epic Echo : 05-16-2017  epic Stress test:  no Cardiac Cath :  no Activity level:  Per cardiology note pt can do over 4 METS of activity Sleep Study/ CPAP :  NO Fasting Blood Sugar :      / Checks Blood Sugar -- times a day:   Per pt his glucose machine is broken and waiting for another one Blood Thinner/ Instructions /Last Dose:  Coumadin ASA / Instructions/ Last Dose :  NO Pt was given cardiac clearance to stop with lovenox bridging , managed by his pcp.  Pt had office visit w/ pharmacist 11-02-2020 given  instructions, note in epic.

## 2020-11-05 ENCOUNTER — Ambulatory Visit: Payer: Medicare Other | Admitting: Pharmacist

## 2020-11-08 ENCOUNTER — Telehealth: Payer: Self-pay | Admitting: *Deleted

## 2020-11-08 ENCOUNTER — Encounter (HOSPITAL_COMMUNITY)
Admission: RE | Admit: 2020-11-08 | Discharge: 2020-11-08 | Disposition: A | Discharge status: Home / Self Care | Payer: Medicare Other | Source: Ambulatory Visit | Attending: Urology | Admitting: Urology

## 2020-11-08 ENCOUNTER — Other Ambulatory Visit: Payer: Self-pay

## 2020-11-08 ENCOUNTER — Telehealth: Payer: Medicare Other

## 2020-11-08 ENCOUNTER — Other Ambulatory Visit (HOSPITAL_COMMUNITY)
Admission: RE | Admit: 2020-11-08 | Discharge: 2020-11-08 | Disposition: A | Discharge status: Home / Self Care | Payer: Medicare Other | Source: Ambulatory Visit | Attending: Urology | Admitting: Urology

## 2020-11-08 DIAGNOSIS — Z01812 Encounter for preprocedural laboratory examination: Secondary | ICD-10-CM | POA: Insufficient documentation

## 2020-11-08 DIAGNOSIS — Z20822 Contact with and (suspected) exposure to covid-19: Secondary | ICD-10-CM | POA: Insufficient documentation

## 2020-11-08 DIAGNOSIS — Z7901 Long term (current) use of anticoagulants: Secondary | ICD-10-CM | POA: Diagnosis not present

## 2020-11-08 LAB — COMPREHENSIVE METABOLIC PANEL
ALT: 16 U/L (ref 0–44)
AST: 19 U/L (ref 15–41)
Albumin: 3.9 g/dL (ref 3.5–5.0)
Alkaline Phosphatase: 46 U/L (ref 38–126)
Anion gap: 11 (ref 5–15)
BUN: 21 mg/dL (ref 8–23)
CO2: 28 mmol/L (ref 22–32)
Calcium: 9.7 mg/dL (ref 8.9–10.3)
Chloride: 102 mmol/L (ref 98–111)
Creatinine, Ser: 1.11 mg/dL (ref 0.61–1.24)
GFR, Estimated: >60 mL/min (ref >60)
Glucose, Bld: 168 mg/dL — ABNORMAL HIGH (ref 70–99)
Potassium: 3.7 mmol/L (ref 3.5–5.1)
Sodium: 141 mmol/L (ref 135–145)
Total Bilirubin: 1.1 mg/dL (ref 0.3–1.2)
Total Protein: 7.5 g/dL (ref 6.5–8.1)

## 2020-11-08 LAB — CBC
HCT: 35.7 % — ABNORMAL LOW (ref 39.0–52.0)
Hemoglobin: 11.7 g/dL — ABNORMAL LOW (ref 13.0–17.0)
MCH: 30.8 pg (ref 26.0–34.0)
MCHC: 32.8 g/dL (ref 30.0–36.0)
MCV: 93.9 fL (ref 80.0–100.0)
Platelets: 204 10*3/uL (ref 150–400)
RBC: 3.8 MIL/uL — ABNORMAL LOW (ref 4.22–5.81)
RDW: 13.3 % (ref 11.5–15.5)
WBC: 5.9 10*3/uL (ref 4.0–10.5)
nRBC: 0 % (ref 0.0–0.2)

## 2020-11-08 LAB — SARS CORONAVIRUS 2 (TAT 6-24 HRS): SARS Coronavirus 2: NEGATIVE

## 2020-11-08 LAB — PROTIME-INR
INR: 1.2 (ref 0.8–1.2)
Prothrombin Time: 14.5 seconds (ref 11.4–15.2)

## 2020-11-08 LAB — APTT: aPTT: 35 seconds (ref 24–36)

## 2020-11-08 NOTE — Telephone Encounter (Signed)
CALLED PATIENT TO REMIND OF PROCEDURE FOR 11-09-20, LVM FOR A RETURN CALL

## 2020-11-08 NOTE — Anesthesia Preprocedure Evaluation (Addendum)
Anesthesia Evaluation  Patient identified by MRN, date of birth, ID band Patient awake    Reviewed: Allergy & Precautions, NPO status , Patient's Chart, lab work & pertinent test results  Airway Mallampati: II  TM Distance: >3 FB Neck ROM: Full    Dental no notable dental hx. (+) Partial Lower, Partial Upper   Pulmonary former smoker,    Pulmonary exam normal breath sounds clear to auscultation       Cardiovascular hypertension, Pt. on medications Normal cardiovascular exam Rhythm:Regular Rate:Normal     Neuro/Psych negative neurological ROS  negative psych ROS   GI/Hepatic Neg liver ROS, GERD  ,  Endo/Other  diabetes, Well Controlled, Type 2, Oral Hypoglycemic Agents  Renal/GU K+ 3.7 Cr 1.11   Prostate CA    Musculoskeletal  (+) Arthritis ,   Abdominal   Peds  Hematology  (+) anemia , Hgb 11.7   Anesthesia Other Findings   Reproductive/Obstetrics negative OB ROS                            Anesthesia Physical Anesthesia Plan  ASA: III  Anesthesia Plan: General   Post-op Pain Management:    Induction: Intravenous  PONV Risk Score and Plan: 3 and Treatment may vary due to age or medical condition and Ondansetron  Airway Management Planned: LMA  Additional Equipment: None  Intra-op Plan:   Post-operative Plan:   Informed Consent: I have reviewed the patients History and Physical, chart, labs and discussed the procedure including the risks, benefits and alternatives for the proposed anesthesia with the patient or authorized representative who has indicated his/her understanding and acceptance.     Dental advisory given  Plan Discussed with: CRNA and Anesthesiologist  Anesthesia Plan Comments:         Anesthesia Quick Evaluation

## 2020-11-09 ENCOUNTER — Ambulatory Visit (HOSPITAL_BASED_OUTPATIENT_CLINIC_OR_DEPARTMENT_OTHER): Payer: Medicare Other | Admitting: Physician Assistant

## 2020-11-09 ENCOUNTER — Other Ambulatory Visit: Payer: Self-pay

## 2020-11-09 ENCOUNTER — Encounter (HOSPITAL_BASED_OUTPATIENT_CLINIC_OR_DEPARTMENT_OTHER): Payer: Self-pay | Admitting: Urology

## 2020-11-09 ENCOUNTER — Ambulatory Visit (HOSPITAL_COMMUNITY): Payer: Medicare Other

## 2020-11-09 ENCOUNTER — Ambulatory Visit (HOSPITAL_BASED_OUTPATIENT_CLINIC_OR_DEPARTMENT_OTHER)
Admission: RE | Admit: 2020-11-09 | Discharge: 2020-11-09 | Disposition: A | Discharge status: Home / Self Care | Payer: Medicare Other | Attending: Urology | Admitting: Urology

## 2020-11-09 ENCOUNTER — Encounter (HOSPITAL_BASED_OUTPATIENT_CLINIC_OR_DEPARTMENT_OTHER)
Admission: RE | Disposition: A | Discharge status: Home / Self Care | Payer: Self-pay | Source: Home / Self Care | Attending: Urology

## 2020-11-09 ENCOUNTER — Ambulatory Visit (HOSPITAL_BASED_OUTPATIENT_CLINIC_OR_DEPARTMENT_OTHER): Payer: Medicare Other | Admitting: Anesthesiology

## 2020-11-09 DIAGNOSIS — Z7984 Long term (current) use of oral hypoglycemic drugs: Secondary | ICD-10-CM | POA: Insufficient documentation

## 2020-11-09 DIAGNOSIS — C61 Malignant neoplasm of prostate: Secondary | ICD-10-CM | POA: Diagnosis present

## 2020-11-09 DIAGNOSIS — Z87891 Personal history of nicotine dependence: Secondary | ICD-10-CM | POA: Diagnosis not present

## 2020-11-09 DIAGNOSIS — Z888 Allergy status to other drugs, medicaments and biological substances status: Secondary | ICD-10-CM | POA: Diagnosis not present

## 2020-11-09 DIAGNOSIS — Z79899 Other long term (current) drug therapy: Secondary | ICD-10-CM | POA: Diagnosis not present

## 2020-11-09 DIAGNOSIS — I422 Other hypertrophic cardiomyopathy: Secondary | ICD-10-CM | POA: Diagnosis not present

## 2020-11-09 DIAGNOSIS — E782 Mixed hyperlipidemia: Secondary | ICD-10-CM | POA: Diagnosis not present

## 2020-11-09 DIAGNOSIS — I493 Ventricular premature depolarization: Secondary | ICD-10-CM | POA: Diagnosis not present

## 2020-11-09 HISTORY — DX: Unspecified osteoarthritis, unspecified site: M19.90

## 2020-11-09 HISTORY — DX: Other hypertrophic cardiomyopathy: I42.2

## 2020-11-09 HISTORY — DX: Mixed hyperlipidemia: E78.2

## 2020-11-09 HISTORY — DX: Benign prostatic hyperplasia with lower urinary tract symptoms: N13.8

## 2020-11-09 HISTORY — DX: Presence of dental prosthetic device (complete) (partial): Z97.2

## 2020-11-09 HISTORY — DX: Personal history of other diseases of the digestive system: Z87.19

## 2020-11-09 HISTORY — DX: Anemia, unspecified: D64.9

## 2020-11-09 HISTORY — DX: Pseudocyst of pancreas: K86.3

## 2020-11-09 HISTORY — DX: Diverticulosis of large intestine without perforation or abscess without bleeding: K57.30

## 2020-11-09 HISTORY — DX: Personal history of other venous thrombosis and embolism: Z86.718

## 2020-11-09 HISTORY — DX: Benign prostatic hyperplasia with lower urinary tract symptoms: N40.1

## 2020-11-09 HISTORY — DX: Presence of spectacles and contact lenses: Z97.3

## 2020-11-09 HISTORY — DX: Personal history of adenomatous and serrated colon polyps: Z86.0101

## 2020-11-09 HISTORY — DX: Personal history of colonic polyps: Z86.010

## 2020-11-09 HISTORY — PX: SPACE OAR INSTILLATION: SHX6769

## 2020-11-09 HISTORY — DX: Long term (current) use of anticoagulants: Z79.01

## 2020-11-09 HISTORY — PX: RADIOACTIVE SEED IMPLANT: SHX5150

## 2020-11-09 HISTORY — DX: Unilateral inguinal hernia, without obstruction or gangrene, not specified as recurrent: K40.90

## 2020-11-09 HISTORY — PX: CYSTOSCOPY: SHX5120

## 2020-11-09 HISTORY — DX: Personal history of other diseases of the musculoskeletal system and connective tissue: Z87.39

## 2020-11-09 HISTORY — DX: Induration penis plastica: N48.6

## 2020-11-09 HISTORY — DX: Type 2 diabetes mellitus without complications: E11.9

## 2020-11-09 LAB — GLUCOSE, CAPILLARY: Glucose-Capillary: 154 mg/dL — ABNORMAL HIGH (ref 70–99)

## 2020-11-09 SURGERY — INSERTION, RADIATION SOURCE, PROSTATE
Anesthesia: General | Site: Rectum

## 2020-11-09 MED ORDER — ACETAMINOPHEN 325 MG RE SUPP: 650.0000 mg | RECTAL | Status: DC | PRN

## 2020-11-09 MED ORDER — IOHEXOL 300 MG/ML  SOLN
INTRAMUSCULAR | Status: DC | PRN
  Administered 2020-11-09: 7 mL via URETHRAL

## 2020-11-09 MED ORDER — PROPOFOL 10 MG/ML IV BOLUS
INTRAVENOUS | Status: AC
  Filled 2020-11-09: qty 40

## 2020-11-09 MED ORDER — SODIUM CHLORIDE 0.9 % IR SOLN
Status: DC | PRN
  Administered 2020-11-09: 250 mL

## 2020-11-09 MED ORDER — FLEET ENEMA 7-19 GM/118ML RE ENEM: 1.0000 | ENEMA | Freq: Once | RECTAL | Status: DC

## 2020-11-09 MED ORDER — PHENYLEPHRINE 40 MCG/ML (10ML) SYRINGE FOR IV PUSH (FOR BLOOD PRESSURE SUPPORT)
PREFILLED_SYRINGE | INTRAVENOUS | Status: DC | PRN
  Administered 2020-11-09: 80 ug via INTRAVENOUS
  Administered 2020-11-09: 120 ug via INTRAVENOUS

## 2020-11-09 MED ORDER — CIPROFLOXACIN IN D5W 400 MG/200ML IV SOLN
400.0000 mg | INTRAVENOUS | Status: AC
  Administered 2020-11-09: 400 mg via INTRAVENOUS

## 2020-11-09 MED ORDER — ONDANSETRON HCL 4 MG/2ML IJ SOLN: 4.0000 mg | Freq: Once | INTRAMUSCULAR | Status: DC | PRN

## 2020-11-09 MED ORDER — DEXAMETHASONE SODIUM PHOSPHATE 10 MG/ML IJ SOLN
INTRAMUSCULAR | Status: DC | PRN
  Administered 2020-11-09: 5 mg via INTRAVENOUS

## 2020-11-09 MED ORDER — SODIUM CHLORIDE 0.9% FLUSH: 3.0000 mL | INTRAVENOUS | Status: DC | PRN

## 2020-11-09 MED ORDER — OXYCODONE HCL 5 MG PO TABS: 5.0000 mg | ORAL_TABLET | ORAL | Status: DC | PRN

## 2020-11-09 MED ORDER — HYDROCODONE-ACETAMINOPHEN 5-325 MG PO TABS: 1.0000 | ORAL_TABLET | Freq: Four times a day (QID) | ORAL | 0 refills | Status: DC | PRN

## 2020-11-09 MED ORDER — GLYCOPYRROLATE PF 0.2 MG/ML IJ SOSY
PREFILLED_SYRINGE | INTRAMUSCULAR | Status: DC | PRN
  Administered 2020-11-09: .2 mg via INTRAVENOUS

## 2020-11-09 MED ORDER — FENTANYL CITRATE (PF) 100 MCG/2ML IJ SOLN
INTRAMUSCULAR | Status: DC | PRN
  Administered 2020-11-09 (×3): 25 ug via INTRAVENOUS
  Administered 2020-11-09: 50 ug via INTRAVENOUS

## 2020-11-09 MED ORDER — LIDOCAINE HCL (PF) 2 % IJ SOLN
INTRAMUSCULAR | Status: AC
  Filled 2020-11-09: qty 5

## 2020-11-09 MED ORDER — FENTANYL CITRATE (PF) 100 MCG/2ML IJ SOLN: 25.0000 ug | INTRAMUSCULAR | Status: DC | PRN

## 2020-11-09 MED ORDER — SODIUM CHLORIDE 0.9 % IV SOLN: 250.0000 mL | INTRAVENOUS | Status: DC | PRN

## 2020-11-09 MED ORDER — LACTATED RINGERS IV SOLN: INTRAVENOUS | Status: DC

## 2020-11-09 MED ORDER — ONDANSETRON HCL 4 MG/2ML IJ SOLN
INTRAMUSCULAR | Status: AC
  Filled 2020-11-09: qty 2

## 2020-11-09 MED ORDER — PROPOFOL 10 MG/ML IV BOLUS
INTRAVENOUS | Status: DC | PRN
  Administered 2020-11-09: 50 mg via INTRAVENOUS
  Administered 2020-11-09: 20 mg via INTRAVENOUS
  Administered 2020-11-09: 50 mg via INTRAVENOUS
  Administered 2020-11-09 (×2): 20 mg via INTRAVENOUS

## 2020-11-09 MED ORDER — STERILE WATER FOR IRRIGATION IR SOLN
Status: DC | PRN
  Administered 2020-11-09: 3 mL

## 2020-11-09 MED ORDER — CIPROFLOXACIN IN D5W 400 MG/200ML IV SOLN
INTRAVENOUS | Status: AC
  Filled 2020-11-09: qty 200

## 2020-11-09 MED ORDER — DEXAMETHASONE SODIUM PHOSPHATE 10 MG/ML IJ SOLN
INTRAMUSCULAR | Status: AC
  Filled 2020-11-09: qty 1

## 2020-11-09 MED ORDER — PHENYLEPHRINE 40 MCG/ML (10ML) SYRINGE FOR IV PUSH (FOR BLOOD PRESSURE SUPPORT)
PREFILLED_SYRINGE | INTRAVENOUS | Status: AC
  Filled 2020-11-09: qty 10

## 2020-11-09 MED ORDER — LIDOCAINE 2% (20 MG/ML) 5 ML SYRINGE
INTRAMUSCULAR | Status: DC | PRN
  Administered 2020-11-09: 60 mg via INTRAVENOUS

## 2020-11-09 MED ORDER — ACETAMINOPHEN 500 MG PO TABS
ORAL_TABLET | ORAL | Status: AC
  Filled 2020-11-09: qty 2

## 2020-11-09 MED ORDER — SODIUM CHLORIDE (PF) 0.9 % IJ SOLN
INTRAMUSCULAR | Status: DC | PRN
  Administered 2020-11-09: 3 mL

## 2020-11-09 MED ORDER — GLYCOPYRROLATE PF 0.2 MG/ML IJ SOSY
PREFILLED_SYRINGE | INTRAMUSCULAR | Status: AC
  Filled 2020-11-09: qty 1

## 2020-11-09 MED ORDER — ONDANSETRON HCL 4 MG/2ML IJ SOLN
INTRAMUSCULAR | Status: DC | PRN
  Administered 2020-11-09: 4 mg via INTRAVENOUS

## 2020-11-09 MED ORDER — MORPHINE SULFATE (PF) 4 MG/ML IV SOLN: 2.0000 mg | INTRAVENOUS | Status: DC | PRN

## 2020-11-09 MED ORDER — ACETAMINOPHEN 325 MG PO TABS
ORAL_TABLET | ORAL | Status: DC | PRN
  Administered 2020-11-09: 1000 mg via ORAL

## 2020-11-09 MED ORDER — FENTANYL CITRATE (PF) 100 MCG/2ML IJ SOLN
INTRAMUSCULAR | Status: AC
  Filled 2020-11-09: qty 2

## 2020-11-09 MED ORDER — ACETAMINOPHEN 10 MG/ML IV SOLN: 1000.0000 mg | Freq: Once | INTRAVENOUS | Status: DC | PRN

## 2020-11-09 MED ORDER — EPHEDRINE SULFATE-NACL 50-0.9 MG/10ML-% IV SOSY
PREFILLED_SYRINGE | INTRAVENOUS | Status: DC | PRN
  Administered 2020-11-09: 15 mg via INTRAVENOUS

## 2020-11-09 MED ORDER — SODIUM CHLORIDE 0.9% FLUSH: 3.0000 mL | Freq: Two times a day (BID) | INTRAVENOUS | Status: DC

## 2020-11-09 MED ORDER — ACETAMINOPHEN 325 MG PO TABS: 650.0000 mg | ORAL_TABLET | ORAL | Status: DC | PRN

## 2020-11-09 MED ORDER — EPHEDRINE 5 MG/ML INJ
INTRAVENOUS | Status: AC
  Filled 2020-11-09: qty 10

## 2020-11-09 SURGICAL SUPPLY — 45 items
AGX100 SEEDS ×350 IMPLANT
BAG DRN RND TRDRP ANRFLXCHMBR (UROLOGICAL SUPPLIES) ×3
BAG URINE DRAIN 2000ML AR STRL (UROLOGICAL SUPPLIES) ×5 IMPLANT
BLADE CLIPPER SENSICLIP SURGIC (BLADE) ×5 IMPLANT
CATH FOLEY 2WAY SLVR  5CC 16FR (CATHETERS) ×5
CATH FOLEY 2WAY SLVR 5CC 16FR (CATHETERS) ×3 IMPLANT
CATH ROBINSON RED A/P 16FR (CATHETERS) IMPLANT
CATH ROBINSON RED A/P 20FR (CATHETERS) ×5 IMPLANT
CLOTH BEACON ORANGE TIMEOUT ST (SAFETY) ×5 IMPLANT
CNTNR URN SCR LID CUP LEK RST (MISCELLANEOUS) ×6 IMPLANT
CONT SPEC 4OZ STRL OR WHT (MISCELLANEOUS) ×10
COVER BACK TABLE 60X90IN (DRAPES) ×5 IMPLANT
COVER MAYO STAND STRL (DRAPES) ×5 IMPLANT
DRAPE C-ARM 35X43 STRL (DRAPES) IMPLANT
DRSG TEGADERM 4X4.75 (GAUZE/BANDAGES/DRESSINGS) ×5 IMPLANT
DRSG TEGADERM 8X12 (GAUZE/BANDAGES/DRESSINGS) ×10 IMPLANT
GAUZE SPONGE 4X4 12PLY STRL LF (GAUZE/BANDAGES/DRESSINGS) ×5 IMPLANT
GLOVE BIO SURGEON STRL SZ 6.5 (GLOVE) ×4 IMPLANT
GLOVE BIO SURGEON STRL SZ7 (GLOVE) ×5 IMPLANT
GLOVE BIO SURGEON STRL SZ7.5 (GLOVE) IMPLANT
GLOVE BIO SURGEON STRL SZ8 (GLOVE) IMPLANT
GLOVE BIO SURGEONS STRL SZ 6.5 (GLOVE) ×1
GLOVE BIOGEL PI IND STRL 7.0 (GLOVE) ×6 IMPLANT
GLOVE BIOGEL PI INDICATOR 7.0 (GLOVE) ×4
GLOVE SURG ORTHO 8.5 STRL (GLOVE) ×5 IMPLANT
GLOVE SURG SS PI 6.5 STRL IVOR (GLOVE) IMPLANT
GLOVE SURG SS PI 7.0 STRL IVOR (GLOVE) ×5 IMPLANT
GLOVE SURG SS PI 8.0 STRL IVOR (GLOVE) ×10 IMPLANT
GOWN STRL REUS W/ TWL XL LVL3 (GOWN DISPOSABLE) ×6 IMPLANT
GOWN STRL REUS W/TWL XL LVL3 (GOWN DISPOSABLE) ×30 IMPLANT
HOLDER FOLEY CATH W/STRAP (MISCELLANEOUS) ×5 IMPLANT
IMPL SPACEOAR VUE SYSTEM (Spacer) ×3 IMPLANT
IMPLANT SPACEOAR VUE SYSTEM (Spacer) ×5 IMPLANT
IV NS 1000ML (IV SOLUTION) ×5
IV NS 1000ML BAXH (IV SOLUTION) ×3 IMPLANT
KIT TURNOVER CYSTO (KITS) ×5 IMPLANT
MARKER SKIN DUAL TIP RULER LAB (MISCELLANEOUS) ×5 IMPLANT
PACK CYSTO (CUSTOM PROCEDURE TRAY) ×5 IMPLANT
SURGILUBE 2OZ TUBE FLIPTOP (MISCELLANEOUS) ×5 IMPLANT
SUT BONE WAX W31G (SUTURE) IMPLANT
SYR 10ML LL (SYRINGE) ×10 IMPLANT
TOWEL OR 17X26 10 PK STRL BLUE (TOWEL DISPOSABLE) ×5 IMPLANT
UNDERPAD 30X36 HEAVY ABSORB (UNDERPADS AND DIAPERS) ×10 IMPLANT
WATER STERILE IRR 3000ML UROMA (IV SOLUTION) IMPLANT
WATER STERILE IRR 500ML POUR (IV SOLUTION) ×5 IMPLANT

## 2020-11-09 NOTE — Anesthesia Procedure Notes (Signed)
Procedure Name: LMA Insertion Date/Time: 11/09/2020 9:42 AM Performed by: Mechele Claude, CRNA Pre-anesthesia Checklist: Patient identified, Emergency Drugs available, Suction available and Patient being monitored Patient Re-evaluated:Patient Re-evaluated prior to induction Oxygen Delivery Method: Circle system utilized Preoxygenation: Pre-oxygenation with 100% oxygen Induction Type: IV induction Ventilation: Mask ventilation without difficulty LMA: LMA inserted LMA Size: 4.0 Number of attempts: 1 Airway Equipment and Method: Bite block Placement Confirmation: positive ETCO2 Tube secured with: Tape Dental Injury: Teeth and Oropharynx as per pre-operative assessment

## 2020-11-09 NOTE — Transfer of Care (Signed)
Immediate Anesthesia Transfer of Care Note  Patient: Alan [name].  Procedure(s) Performed: Procedure(s) (LRB): RADIOACTIVE SEED IMPLANT/BRACHYTHERAPY IMPLANT (N/A) SPACE OAR INSTILLATION (N/A) CYSTOSCOPY FLEXIBLE (N/A)  Patient Location: PACU  Anesthesia Type: General  Level of Consciousness: awake, alert  and oriented  Airway & Oxygen Therapy: Patient Spontanous Breathing and Patient connected to face mask oxygen, oral airway removed on arrival.  Post-op Assessment: Report given to PACU RN and Post -op Vital signs reviewed and stable  Post vital signs: Reviewed and stable  Complications: No apparent anesthesia complications Last Vitals:  Vitals Value Taken Time  BP 141/66 11/09/20 1116  Temp 36.4 C 11/09/20 1116  Pulse 63 11/09/20 1122  Resp 10 11/09/20 1122  SpO2 100 % 11/09/20 1122  Vitals shown include unvalidated device data.  Last Pain:  Vitals:   11/09/20 0808  TempSrc: Oral  PainSc: 0-No pain         Complications: No complications documented.

## 2020-11-09 NOTE — Interval H&P Note (Signed)
History and Physical Interval Note:  11/09/2020 8:32 AM  Alan [name].  has presented today for surgery, with the diagnosis of PROSTATE CANCER.  The various methods of treatment have been discussed with the patient and family. After consideration of risks, benefits and other options for treatment, the patient has consented to  Procedure(s): RADIOACTIVE SEED IMPLANT/BRACHYTHERAPY IMPLANT (N/A) SPACE OAR INSTILLATION (N/A) as a surgical intervention.  The patient's history has been reviewed, patient examined, no change in status, stable for surgery.  I have reviewed the patient's chart and labs.  Questions were answered to the patient's satisfaction.     Irine Seal

## 2020-11-09 NOTE — Anesthesia Postprocedure Evaluation (Signed)
Anesthesia Post Note  Patient: Alan [name].  Procedure(s) Performed: RADIOACTIVE SEED IMPLANT/BRACHYTHERAPY IMPLANT (N/A Prostate) SPACE OAR INSTILLATION (N/A Rectum) CYSTOSCOPY FLEXIBLE (N/A Bladder)     Patient location during evaluation: PACU Anesthesia Type: General Level of consciousness: awake and alert Pain management: pain level controlled Vital Signs Assessment: post-procedure vital signs reviewed and stable Respiratory status: spontaneous breathing, nonlabored ventilation, respiratory function stable and patient connected to nasal cannula oxygen Cardiovascular status: blood pressure returned to baseline and stable Postop Assessment: no apparent nausea or vomiting Anesthetic complications: no   No complications documented.  Last Vitals:  Vitals:   11/09/20 1145 11/09/20 1200  BP: (!) 161/73 (!) 171/75  Pulse: (!) 57 62  Resp: 11 14  Temp:    SpO2: 100% 99%    Last Pain:  Vitals:   11/09/20 1145  TempSrc:   PainSc: 0-No pain                 Barnet Glasgow

## 2020-11-09 NOTE — Discharge Instructions (Addendum)
Brachytherapy for Prostate Cancer, Care After  This sheet gives you information about how to care for yourself after your procedure. Your health care provider may also give you more specific instructions. If you have problems or questions, contact your health care provider. What can I expect after the procedure? After the procedure, it is common to have:  Trouble passing urine.  Blood in the urine or semen.  Constipation.  Frequent feeling of an urgent need to urinate.  Bruising, swelling, and tenderness of the area behind the scrotum (perineum).  Bloating and gas.  Fatigue.  Burning or pain in the rectum.  Problems getting or keeping an erection (erectile dysfunction).  Nausea. Follow these instructions at home: Managing pain, stiffness, and swelling  If directed, apply ice to the affected area: ? Put ice in a plastic bag. ? Place a towel between your skin and the bag. ? Leave the ice on for 20 minutes, 2-3 times a day.  Try not to sit directly on the area behind the scrotum. A soft cushion can help with discomfort. Activity  Do not drive for 24 hours if you were given a medicine to help you relax (sedative).  Do not drive or use heavy machinery while taking prescription pain medicine.  Rest as told by your health care provider.  Most people can return to normal activities a few days or weeks after the procedure. Ask your health care provider what activities are safe for you. Eating and drinking  Drink enough fluid to keep your urine clear or pale yellow.  Eat a healthy, balanced diet. This includes lean proteins, whole grains, and plenty of fruits and vegetables. General instructions  Take over-the-counter and prescription medicines only as told by your health care provider.  Keep all follow-up visits as told by your health care provider. This is important. You may still need additional treatment.  Do not take baths, swim, or use a hot tub until your health  care provider approves. Shower and wash the area behind the scrotum gently.  Do not have sex for one week after the treatment, or until your health care provider approves.  If you have permanent, low-dose brachytherapy implants: ? Limit close contact with children and pregnant women for 2 months or as told by your health care provider. This is important because of the radiation that is still active in the prostate. ? You may set off radioactive sensors, such as airport screenings. Ask your health care provider for a document that explains your treatment. ? You may be instructed to use a condom during sex for the first 2 months after low-dose brachytherapy. Contact a health care provider if:  You have a fever or chills.  You do not have a bowel movement for 3-4 days after the procedure.  You have diarrhea for 3-4 days after the procedure.  You develop any new symptoms, such as problems with urinating or erectile dysfunction.  You have abdomen (abdominal) pain.  You have more blood in your urine. Get help right away if:  You cannot urinate.  There is excessive bleeding from your rectum.  You have unusual drainage coming from your rectum.  You have severe pain in the treated area that does not go away with pain medicine.  You have severe nausea or vomiting. Summary  If you have permanent, low-dose brachytherapy implants, limit close contact with children and pregnant women for 2 months or as told by your health care provider. This is important because of the radiation  that is still active in the prostate.  Talk with your health care provider about your risk of brachytherapy side effects, such as erectile dysfunction or urinary problems. Your health care provider will be able to recommend possible treatment options.  Keep all follow-up visits as told by your health care provider. This is important. You may need additional treatment.  You may resume the Warfarin in the morning if  you are not bleeding.    This information is not intended to replace advice given to you by your health care provider. Make sure you discuss any questions you have with your health care provider. Document Revised: 11/09/2017 Document Reviewed: 12/29/2016 Elsevier Patient Education  [place] Instructions  Activity: Get plenty of rest for the remainder of the day. A responsible adult should stay with you for 24 hours following the procedure.  For the next 24 hours, DO NOT: -Drive a car -Paediatric nurse -Drink alcoholic beverages -Take any medication unless instructed by your physician -Make any legal decisions or sign important papers.  Meals: Start with liquid foods such as gelatin or soup. Progress to regular foods as tolerated. Avoid greasy, spicy, heavy foods. If nausea and/or vomiting occur, drink only clear liquids until the nausea and/or vomiting subsides. Call your physician if vomiting continues.  Special Instructions/Symptoms: Your throat may feel dry or sore from the anesthesia or the breathing tube placed in your throat during surgery. If this causes discomfort, gargle with warm salt water. The discomfort should disappear within 24 hours.  If you had a scopolamine patch placed behind your ear for the management of post- operative nausea and/or vomiting:  1. The medication in the patch is effective for 72 hours, after which it should be removed.  Wrap patch in a tissue and discard in the trash. Wash hands thoroughly with soap and water. 2. You may remove the patch earlier than 72 hours if you experience unpleasant side effects which may include dry mouth, dizziness or visual disturbances. 3. Avoid touching the patch. Wash your hands with soap and water after contact with the patch.

## 2020-11-10 NOTE — Telephone Encounter (Signed)
Saw pt for enox bridge on 11/23, we will f/u with him post-procedure

## 2020-11-10 NOTE — Op Note (Signed)
PATIENT:  Alan [name].  PRE-OPERATIVE DIAGNOSIS:  Adenocarcinoma of the prostate  POST-OPERATIVE DIAGNOSIS:  Same  PROCEDURE:  Procedure(s): 1. I-125 radioactive seed implantation 2. SpaceOAR implantation. 3.  Cystoscopy  SURGEON:  Surgeon(s): Irine Seal MD  Radiation oncologist: Dr. Tyler Pita  ANESTHESIA:  General  EBL:  Minimal  DRAINS: 77 French Foley catheter  INDICATION: Alan [name]. is a 79 y.o. with Stage T1c, Gleason 7(4+3) prostate cancer who has elected brachytherapy for treatment.  Description of procedure: After informed consent the patient was brought to the major OR, placed on the table and administered general anesthesia. He was then moved to the modified lithotomy position with his perineum perpendicular to the floor. His perineum and genitalia were then sterilely prepped. An official timeout was then performed. A 16 French Foley catheter was then placed in the bladder and filled with dilute contrast, a rectal tube was placed in the rectum and the transrectal ultrasound probe was placed in the rectum and affixed to the stand. He was then sterilely draped.  The sterile grid was installed.   Anchor needles were then placed.   Real time ultrasonography was used along with the seed planning software spot-pro version 3.1-00. This was used to develop the seed plan including the number of needles as well as number of seeds required for complete and adequate coverage. Real-time ultrasonography was then used along with the previously developed plan  to implant a total of 70 seeds using 20 needles for a target dose of 145 Gy. This proceeded without difficulty or complication.  The anchor needles and guide were removed and the SpaceOAR needle was passed under US guidance into the fat stripe posterior to the prostate with the tip in the midline at mid prostate. A puff of NS confirmed appropriate positioning and the SpaceOAR polymer was then injected over 10 seconds into the  space with excellent distribution.     A Foley catheter was then removed as well as the transrectal ultrasound probe and rectal probe. Flexible cystoscopy was then performed using the 17 French flexible scope which revealed a normal urethra throughout its length down to the sphincter which appeared intact. The prostatic urethra was 3-4cm with bilobar hyperplasia and a small middle lobe.. The bladder was then entered and fully and systematically inspected.  The ureteral orifices were noted to be of normal configuration and position. The mucosa revealed no evidence of tumors. There were also no stones identified within the bladder.  No seeds or spacers were seen and/or removed from the bladder.  The cystoscope was then removed.  The drapes were removed.  The perineum was cleaned and dressed.  He was taken out of the lithotomy position and was awakened and taken to recovery room in stable and satisfactory condition. He tolerated procedure well and there were no intraoperative complications.

## 2020-11-10 NOTE — Progress Notes (Signed)
  Radiation Oncology         [phone]) [phone] ________________________________  Name: [name] MRN: [id number]  Date: 11/10/2020  DOB: Jun 25, 1941       Prostate Seed Implant  UY:[id number], Fransisca Kaufmann, MD  No ref. provider found  DIAGNOSIS:  Oncology History  Prostate cancer ([place])  04/13/2014 Initial Diagnosis   Prostate cancer ([place])   06/25/2020 Cancer Staging   Staging form: Prostate, AJCC 8th Edition - Clinical stage from 06/25/2020: Stage IIC (cT2b, cN0, cM0, PSA: 12.8, Grade Group: 3) - Signed by Freeman Caldron, PA-C on 09/29/2020     No diagnosis found.  PROCEDURE: Insertion of radioactive I-125 seeds into the prostate gland.  RADIATION DOSE: 145 Gy, definitive therapy.  TECHNIQUE: Zamauri Nez. was brought to the operating room with the urologist. He was placed in the dorsolithotomy position. He was catheterized and a rectal tube was inserted. The perineum was shaved, prepped and draped. The ultrasound probe was then introduced into the rectum to see the prostate gland.  TREATMENT DEVICE: A needle grid was attached to the ultrasound probe stand and anchor needles were placed.  3D PLANNING: The prostate was imaged in 3D using a sagittal sweep of the prostate probe. These images were transferred to the planning computer. There, the prostate, urethra and rectum were defined on each axial reconstructed image. Then, the software created an optimized 3D plan and a few seed positions were adjusted. The quality of the plan was reviewed using [facility] information for the target and the following two organs at risk:  Urethra and Rectum.  Then the accepted plan was printed and handed off to the radiation therapist.  Under my supervision, the custom loading of the seeds and spacers was carried out and loaded into sealed vicryl sleeves.  These pre-loaded needles were then placed into the needle holder.Marland Kitchen  PROSTATE VOLUME STUDY:  Using transrectal ultrasound the volume of the prostate was verified  to be 58.8 cc.  SPECIAL TREATMENT PROCEDURE/SUPERVISION AND HANDLING: The pre-loaded needles were then delivered under sagittal guidance. A total of 20 needles were used to deposit 70 seeds in the prostate gland. The individual seed activity was 0.544 mCi.  SpaceOAR:  Yes  COMPLEX SIMULATION: At the end of the procedure, an anterior radiograph of the pelvis was obtained to document seed positioning and count. Cystoscopy was performed to check the urethra and bladder.  MICRODOSIMETRY: At the end of the procedure, the patient was emitting 0.113 mR/hr at 1 meter. Accordingly, he was considered safe for hospital discharge.  PLAN: The patient will return to the radiation oncology clinic for post implant CT dosimetry in three weeks.   ________________________________  Sheral Apley Tammi Klippel, M.D.

## 2020-11-11 ENCOUNTER — Encounter (HOSPITAL_BASED_OUTPATIENT_CLINIC_OR_DEPARTMENT_OTHER): Payer: Self-pay | Admitting: Urology

## 2020-11-12 ENCOUNTER — Other Ambulatory Visit: Payer: Self-pay

## 2020-11-12 ENCOUNTER — Ambulatory Visit (INDEPENDENT_AMBULATORY_CARE_PROVIDER_SITE_OTHER): Payer: Medicare Other | Admitting: Pharmacist

## 2020-11-12 DIAGNOSIS — I480 Paroxysmal atrial fibrillation: Secondary | ICD-10-CM | POA: Diagnosis not present

## 2020-11-12 DIAGNOSIS — Z86718 Personal history of other venous thrombosis and embolism: Secondary | ICD-10-CM | POA: Diagnosis not present

## 2020-11-12 LAB — COAGUCHEK XS/INR WAIVED
INR: 1.1 (ref 0.9–1.1)
Prothrombin Time: 13.2 s

## 2020-11-12 MED ORDER — ENOXAPARIN SODIUM 120 MG/0.8ML ~~LOC~~ SOLN
120.0000 mg | Freq: Every evening | SUBCUTANEOUS | 0 refills | Status: DC
Start: 2020-11-12 — End: 2021-02-24

## 2020-11-12 NOTE — Progress Notes (Signed)
    11/12/2020 Name: Alan [name]. MRN: [id number] DOB: 01-31-1941  S:79 yoMPresents forINR/coumadin management. Patient had urologic procedure on 11/09/2020. INR today is 1.1 Today's weight 170lb Patient prefers once daily dosingfor lovenox. Bridge plan for patient is as follows:   11/13/2020: Inject Lovenox 120mg  in the fatty tissue every once daily and take Coumadin as directed.   11/14/2020: Inject Lovenox 120mg  in the fatty tissue every once daily and take Coumadin  11/15/2020: Inject Lovenox 120mg  in the fatty tissue every once daily and take Coumadin  11/16/2020: Inject Lovenox 120mg  in the fatty tissue every once daily and take Coumadin  11/17/2020: Inject Lovenox 120mg  in the fatty tissue every once daily and take Coumadin  11/18/2020:Follow up PCP appt/INR recheck   Written patient instructions provided. Total time in face to face counseling 56minutes.   Follow up PCP Clinic Visit ON12/6/21.  Regina Eck, PharmD, BCPS Clinical Pharmacist, [place] [phone]

## 2020-11-18 ENCOUNTER — Ambulatory Visit: Payer: Medicare Other | Admitting: Family Medicine

## 2020-11-19 ENCOUNTER — Ambulatory Visit (INDEPENDENT_AMBULATORY_CARE_PROVIDER_SITE_OTHER): Payer: Medicare Other | Admitting: Family Medicine

## 2020-11-19 ENCOUNTER — Ambulatory Visit: Payer: Medicare Other | Admitting: Urology

## 2020-11-19 ENCOUNTER — Other Ambulatory Visit: Payer: Self-pay

## 2020-11-19 ENCOUNTER — Encounter: Payer: Self-pay | Admitting: Family Medicine

## 2020-11-19 VITALS — BP 145/55 | HR 55 | Ht 65.0 in | Wt 170.0 lb

## 2020-11-19 DIAGNOSIS — Z7901 Long term (current) use of anticoagulants: Secondary | ICD-10-CM

## 2020-11-19 DIAGNOSIS — D6859 Other primary thrombophilia: Secondary | ICD-10-CM | POA: Diagnosis not present

## 2020-11-19 DIAGNOSIS — Z86718 Personal history of other venous thrombosis and embolism: Secondary | ICD-10-CM | POA: Diagnosis not present

## 2020-11-19 DIAGNOSIS — I480 Paroxysmal atrial fibrillation: Secondary | ICD-10-CM

## 2020-11-19 LAB — COAGUCHEK XS/INR WAIVED
INR: 1.4 — ABNORMAL HIGH (ref 0.9–1.1)
Prothrombin Time: 17.2 s

## 2020-11-19 MED ORDER — WARFARIN SODIUM 5 MG PO TABS: ORAL_TABLET | ORAL | 0 refills | Status: DC

## 2020-11-19 NOTE — Progress Notes (Signed)
   BP (!) 145/55   Pulse (!) 55   Ht 5\' 5"  (1.651 m)   Wt 170 lb (77.1 kg)   SpO2 99%   BMI 28.29 kg/m    Subjective:   Patient ID: Alan Netters., male    DOB: 08-09-1941, 78 y.o.   MRN: [id number]  HPI: Alan Dermody. is a 79 y.o. male presenting on 11/19/2020 for Allergic Rhinitis  and Medical Management of Chronic Issues   HPI Coumadin recheck Target goal: 2.0-3.0 Reason on anticoagulation: A. fib and history of DVT Patient denies any bruising or bleeding or chest pain or palpitations   Relevant past medical, surgical, family and social history reviewed and updated as indicated. Interim medical history since our last visit reviewed. Allergies and medications reviewed and updated.  Review of Systems  Constitutional: Negative for chills and fever.  Respiratory: Negative for shortness of breath and wheezing.   Cardiovascular: Negative for chest pain and leg swelling.  Skin: Negative for rash.  All other systems reviewed and are negative.   Per HPI unless specifically indicated above      Objective:   BP (!) 145/55   Pulse (!) 55   Ht 5\' 5"  (1.651 m)   Wt 170 lb (77.1 kg)   SpO2 99%   BMI 28.29 kg/m   Wt Readings from Last 3 Encounters:  11/19/20 170 lb (77.1 kg)  11/09/20 168 lb 11.2 oz (76.5 kg)  11/08/20 171 lb (77.6 kg)    Physical Exam Vitals and nursing note reviewed.  Constitutional:      General: He is not in acute distress.    Appearance: He is well-developed and well-nourished. He is not diaphoretic.  Eyes:     Extraocular Movements: EOM normal.  Cardiovascular:     Pulses: Intact distal pulses.  Musculoskeletal:        General: No edema.  Skin:    General: Skin is warm and dry.     Findings: No bruising or rash.  Neurological:     Mental Status: He is alert and oriented to person, place, and time.     Coordination: Coordination normal.  Psychiatric:        Mood and Affect: Mood and affect normal.        Behavior: Behavior normal.        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 Follow up plan: Return if symptoms worsen or fail to improve, for 1-2 week inr recheck.  Counseling provided for all of the vaccine components Orders Placed This Encounter  Procedures  . CoaguChek XS/INR [place], MD [facility] 11/19/2020, 10:25 AM

## 2020-11-24 NOTE — Progress Notes (Signed)
Subjective:  1. Prostate cancer ([place])   2. Nodular prostate with lower urinary tract symptoms   3. 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  He is voiding well with some dysuria and he has mild gross hematuria.  He remains on warfarin. His IPSS is 6.     On initial biopsy, he had only 5% in one core and elected active survelliance. He had a repeat biopsy on 12/24/13 that showed a single core with <5% Gleason 6 in the right mid lateral prostate. His last biopsy was on 06/28/18 and only showed 2 cores with atypia. He is on finasteride and has had  a slowly rising PSA that was up to 6.4 prior to his most recent biopsy on 06/25/20.  The nadir on finasteride was 1.8.    The repeat biopsy demonstrated 6 positive cores with 2 cores with 7(4+3) on the right and 4 cores with low volume Gleason 6 disease.    His prostate volume is 1m. IPSS is 9.  A bone scan on 07/06/20 was negative.    The CT on 07/29/20 was negative for mets.   His PSA was 6.2 prior to therapy starting finasteride.      IPSS    Row Name 11/26/20 1400         International Prostate Symptom Score   How often have you had the sensation of not emptying your bladder? Less than 1 in 5     How often have you had to urinate less than every two hours? About half the time     How often have you found you stopped and started again several times when you urinated? Not at All     How often have you found it difficult to postpone urination? Not at All     How often have you had a weak urinary stream? Not at All     How often have you had to strain to start urination? Not at All     How many times did you typically get up at night to urinate? 2 Times     Total IPSS Score 6           Quality of Life due to  urinary symptoms   If you were to spend the rest of your life with your urinary condition just the way it is now how would you feel about that? Mostly Satisfied             ROS:  ROS:  A complete review of systems was performed.  All systems are negative except for pertinent findings as noted.   ROS  Allergies  Allergen Reactions  . Allopurinol Other (See Comments)    Significantly decreased WBC's  . Colchicine Other (See Comments)    Significantly decreased WBC's    Outpatient Encounter Medications as of 11/26/2020  Medication Sig  . acetaminophen (TYLENOL) 500 MG tablet Take 500 mg by mouth every 6 (six) hours as needed.  .Marland KitchenamLODipine (NORVASC) 10 MG tablet TAKE 1 TABLET BY MOUTH ONCE DAILY IN THE EVENING  . Blood Glucose Monitoring Suppl (ONE TOUCH ULTRA MINI) w/Device KIT 1 each by Does not apply route 2 (two) times daily.  . chlorthalidone (HYGROTON) 25 MG tablet Take 1  tablet (25 mg total) by mouth daily.  Marland Kitchen enoxaparin (LOVENOX) 120 MG/0.8ML injection Inject 0.8 mLs (120 mg total) into the skin at bedtime.  . Ferrous Sulfate (IRON) 325 (65 Fe) MG TABS Take 1 tablet by mouth once daily with breakfast  . finasteride (PROSCAR) 5 MG tablet Take 1 tablet (5 mg total) by mouth daily.  . fluticasone (FLONASE) 50 MCG/ACT nasal spray Place 1 spray into both nostrils 2 (two) times daily as needed for allergies or rhinitis.  Marland Kitchen glucose blood (ONE TOUCH ULTRA TEST) test strip Use to check BG once daily.  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 HYDROcodone-acetaminophen (NORCO/VICODIN) 5-325 MG tablet Take 1 tablet by mouth every 6 (six) hours as needed for moderate pain.  Marland Kitchen lisinopril (ZESTRIL) 20 MG tablet Take 2 tablets (40 mg total) by mouth daily.  . metFORMIN (GLUCOPHAGE) 1000 MG tablet TAKE 1 TABLET BY MOUTH TWICE DAILY WITH A MEAL  . metoprolol succinate (TOPROL-XL) 100 MG 24 hr tablet TAKE 1 TABLET BY MOUTH ONCE DAILY. TAKE WITH OR IMMEDIATELY FOLLOWING A MEAL  . Multiple  Vitamins-Minerals (CENTRUM SILVER 50+MEN) TABS Take by mouth daily.  Marland Kitchen omeprazole (PRILOSEC) 20 MG capsule Take 1 capsule by mouth once daily  . potassium chloride SA (KLOR-CON) 20 MEQ tablet Take 1 tablet by mouth once daily  . pravastatin (PRAVACHOL) 40 MG tablet TAKE 1 TABLET BY MOUTH ONCE DAILY IN THE EVENING  . Sodium Sulfate-Mag Sulfate-KCl (SUTAB) [phone] MG TABS Take by mouth as needed.   . warfarin (COUMADIN) 5 MG tablet TAKE 1 & 1/2 (ONE & ONE-HALF) TABLETS BY MOUTH ONCE DAILY AT  6  PM  . [DISCONTINUED] warfarin (COUMADIN) 5 MG tablet TAKE 1 tablet every day except on Wed and Sat. Take 1.5 tabs   No facility-administered encounter medications on file as of 11/26/2020.    Past Medical History:  Diagnosis Date  . Anticoagulated on Coumadin    managed by pcp  . Arthritis   . BPH with urinary obstruction   . Diverticulosis of colon   . ED (erectile dysfunction)   . 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 GERD (gastroesophageal reflux disease)   . Heart murmur   . History of acute pancreatitis    2016   necrotizing pancreatitis  . History of adenomatous polyp of colon   . History of DVT of lower extremity   . History of GI bleed 08/2019   upper gi bleed due to duodenal ulcer  . History of gout yrs ago  . Hypertension    followed by pcp  . Hypertrophic cardiomyopathy ([place])    followed by cardiology  . Internal hemorrhoids   . Mixed hyperlipidemia   . Normocytic anemia   . PAF (paroxysmal atrial fibrillation) ([place]) 04/2016   cardiologist-- dr h. Tamala Julian--  event monitor 07-04-2017 epic, NSR/ PAflutter with occasional RVR/  PACs/ PVCs ;  echo 05-16-2017 epic,  moderate LVH with severe asymptomic septal hypertrophy, G1DD, EF 60-65%,  mild AR, mild LAE  . Pancreatic pseudocyst   . Peyronie's disease   . Prostate cancer [facility])  urologist-- dr Kemuel Buchmann/ oncologist-- dr Tammi Klippel   first dx 08/ 2009 Gleason 3+3 active survelliance;  until bx 07/ 2021  Stage T2b, Gleason 4+3  . Right inguinal hernia   . Type 2 diabetes mellitus ([place])  followed by pcp---  (11-03-2020 per pt currently not checking blood sugar due to glucose monitor broken)  . Wears glasses   . Wears partial dentures    upper and lower    Past Surgical History:  Procedure Laterality Date  . CHOLECYSTECTOMY N/A 11/26/2015   Procedure: LAPAROSCOPIC CHOLECYSTECTOMY;  Surgeon: Aviva Signs, MD;  Location: [facility];  Service: General;  Laterality: N/A;  . COLONOSCOPY  last one 09-06-2020  dr stark  . CYSTOSCOPY N/A 11/09/2020   Procedure: CYSTOSCOPY FLEXIBLE;  Surgeon: Irine Seal, MD;  Location[facility];  Service: Urology;  Laterality: N/A;  . ESOPHAGOGASTRODUODENOSCOPY  10-06-2019  _0   . FLEXIBLE BRONCHOSCOPY Bilateral 04/14/2016   Procedure: FLEXIBLE BRONCHOSCOPY;  Surgeon: Sinda Du, MD;  Location: [facility];  Service: Cardiopulmonary;  Laterality: Bilateral;  . HEMORRHOID SURGERY  yrs ago  . RADIOACTIVE SEED IMPLANT N/A 11/09/2020   Procedure: RADIOACTIVE SEED IMPLANT/BRACHYTHERAPY IMPLANT;  Surgeon: Irine Seal, MD;  Location: [facility];  Service: Urology;  Laterality: N/A;  . SPACE OAR INSTILLATION N/A 11/09/2020   Procedure: SPACE OAR INSTILLATION;  Surgeon: Irine Seal, MD;  Location: [facility];  Service: Urology;  Laterality: N/A;  . UMBILICAL HERNIA REPAIR N/A 11/26/2015   Procedure: UMBILICAL HERNIORRHAPHY;  Surgeon: Aviva Signs, MD;  Location: [facility];  Service: General;  Laterality: N/A;    Social History   Socioeconomic History  . Marital status: Married    Spouse name: Alan [name]  . Number of children: 4  . Years of education: Not on file  . Highest education level: 10th grade  Occupational History  . Occupation: Retired     Fish farm manager: [employer]  Tobacco Use  . Smoking status:  Former Smoker    Years: 5.00    Types: Cigarettes    Quit date: 12/11/1986    Years since quitting: 33.9  . Smokeless tobacco: Never Used  Vaping Use  . Vaping Use: Never used  Substance and Sexual Activity  . Alcohol use: No  . Drug use: Never  . Sexual activity: Yes  Other Topics Concern  . Not on file  Social History Narrative  . Not on file   Social Determinants of Health   Financial Resource Strain: Not on file  Food Insecurity: Not on file  Transportation Needs: Not on file  Physical Activity: Not on file  Stress: Not on file  Social Connections: Not on file  Intimate Partner Violence: Not on file    Family History  Problem Relation Age of Onset  . Stroke Mother   . Hypertension Mother   . Alzheimer's disease Mother   . Hypertension Father   . Brain cancer Sister   . Breast cancer Sister   . Hypertension Sister   . Deep vein thrombosis Brother   . Hypertension Brother   . Colon cancer Neg Hx   . Pancreatic disease Neg Hx   . Esophageal cancer Neg Hx   . Rectal cancer Neg Hx   . Stomach cancer Neg Hx        Objective: Vitals:   11/26/20 1405  BP: (!) 162/61  Pulse: 61  Temp: 98.5 F (36.9 C)     Physical Exam Lab Results  Component Value Date   PSA1 6.5 (H) 09/10/2019    Lab Results:  Results for orders placed or performed in visit on 11/26/20 (from the past 24 hour(s))  Urinalysis, Routine w reflex microscopic     Status: Abnormal   Collection  Time: 11/26/20  2:04 PM  Result Value Ref Range   Specific Gravity, UA 1.020 1.005 - 1.030   pH, UA 7.0 5.0 - 7.5   Color, UA Yellow Yellow   Appearance Ur Clear Clear   Leukocytes,UA Trace (A) Negative   Protein,UA Trace (A) Negative/Trace   Glucose, UA Negative Negative   Ketones, UA Trace (A) Negative   RBC, UA 2+ (A) Negative   Bilirubin, UA Negative Negative   Urobilinogen, Ur 0.2 0.2 - 1.0 mg/dL   Nitrite, UA Negative Negative   Microscopic Examination See below:    Narrative    Performed at:  [facility] [address], [place], [place]  [id number] Lab Director: Mina Marble MT, Phone:  [phone]  Microscopic Examination     Status: Abnormal   Collection Time: 11/26/20  2:04 PM   Urine  Result Value Ref Range   WBC, UA 0-5 0 - 5 /hpf   RBC 11-30 (A) 0 - 2 /hpf   Epithelial Cells (non renal) None seen 0 - 10 /hpf   Renal Epithel, UA None seen None seen /hpf   Mucus, UA Present Not Estab.   Bacteria, UA None seen None seen/Few   Narrative   Performed at:  [facility] [address], [place], [place]  [id number] Lab Director: [place], Phone:  [phone]    BMET No results for input(s): NA, K, CL, CO2, GLUCOSE, BUN, CREATININE, CALCIUM in the last 72 hours. PSA PSA  Date Value Ref Range Status  05/20/2020 6.4 (H) < OR = 4.0 ng/mL Final    Comment:    The total PSA value from this assay system is  standardized against the WHO standard. The test  result will be approximately 20% lower when compared  to the equimolar-standardized total PSA (Beckman  Coulter). Comparison of serial PSA results should be  interpreted with this fact in mind. . This test was performed using the Siemens  chemiluminescent method. Values obtained from  different assay methods cannot be used interchangeably. PSA levels, regardless of value, should not be interpreted as absolute evidence of the presence or absence of disease.   11/12/2014 2.0 0.0 - 4.0 ng/mL Final    Comment:    Roche ECLIA methodology. According to the American Urological Association, Serum PSA should decrease and remain at undetectable levels after radical prostatectomy. The AUA defines biochemical recurrence as an initial PSA value 0.2 ng/mL or greater followed by a subsequent confirmatory PSA value 0.2 ng/mL or greater. Values obtained with different assay methods or kits cannot be used interchangeably. Results cannot be interpreted as absolute evidence of the  presence or absence of malignant disease.    No results found for: TESTOSTERONE  UA has 11-30 RBC's.   Studies/Results: No results found.   Assessment & Plan: Prostate cancer. He is doing well s/p seeds for T2b N0 M0  Gleason 7(4+3) disease with a PSA that was rising on finasteride and right prostate induration.    Nodular prostate with obstruction and nocturia.  He will continue the finasteride.    Hematuria.  This is improving.   No orders of the defined types were placed in this encounter.    Orders Placed This Encounter  Procedures  . Microscopic Examination  . Urinalysis, Routine w reflex microscopic  . PSA    Standing Status:   Future    Standing Expiration Date:   03/27/2021      Return in about 3 months (  around 02/24/2021).   CC: Dettinger, Fransisca Kaufmann, MD      Irine Seal 11/27/2020

## 2020-11-26 ENCOUNTER — Ambulatory Visit (INDEPENDENT_AMBULATORY_CARE_PROVIDER_SITE_OTHER): Payer: Medicare Other | Admitting: Urology

## 2020-11-26 ENCOUNTER — Other Ambulatory Visit: Payer: Self-pay

## 2020-11-26 ENCOUNTER — Other Ambulatory Visit: Payer: Self-pay | Admitting: Family Medicine

## 2020-11-26 ENCOUNTER — Encounter: Payer: Self-pay | Admitting: Urology

## 2020-11-26 VITALS — BP 162/61 | HR 61 | Temp 98.5°F | Ht 65.0 in | Wt 170.0 lb

## 2020-11-26 DIAGNOSIS — R351 Nocturia: Secondary | ICD-10-CM

## 2020-11-26 DIAGNOSIS — N403 Nodular prostate with lower urinary tract symptoms: Secondary | ICD-10-CM

## 2020-11-26 DIAGNOSIS — C61 Malignant neoplasm of prostate: Secondary | ICD-10-CM

## 2020-11-26 DIAGNOSIS — D6859 Other primary thrombophilia: Secondary | ICD-10-CM

## 2020-11-26 LAB — URINALYSIS, ROUTINE W REFLEX MICROSCOPIC
Bilirubin, UA: NEGATIVE
Glucose, UA: NEGATIVE
Nitrite, UA: NEGATIVE
Specific Gravity, UA: 1.02 (ref 1.005–1.030)
Urobilinogen, Ur: 0.2 mg/dL (ref 0.2–1.0)
pH, UA: 7 (ref 5.0–7.5)

## 2020-11-26 LAB — MICROSCOPIC EXAMINATION
Bacteria, UA: NONE SEEN
Epithelial Cells (non renal): NONE SEEN /hpf (ref 0–10)
Renal Epithel, UA: NONE SEEN /hpf

## 2020-11-26 NOTE — Progress Notes (Signed)
Urological Symptom Review  Patient is experiencing the following symptoms: Frequent urination Burning/pain with urination Get up at night to urinate Blood in urine   Review of Systems  Gastrointestinal (upper)  : Negative for upper GI symptoms  Gastrointestinal (lower) : Negative for lower GI symptoms  Constitutional : Negative for symptoms  Skin: Negative for skin symptoms  Eyes: Negative for eye symptoms  Ear/Nose/Throat : Negative for Ear/Nose/Throat symptoms  Hematologic/Lymphatic: Negative for Hematologic/Lymphatic symptoms  Cardiovascular : Negative for cardiovascular symptoms  Respiratory : Negative for respiratory symptoms  Endocrine: Negative for endocrine symptoms  Musculoskeletal: Negative for musculoskeletal symptoms  Neurological: Negative for neurological symptoms  Psychologic: Negative for psychiatric symptoms

## 2020-11-29 ENCOUNTER — Other Ambulatory Visit: Payer: Self-pay | Admitting: Family Medicine

## 2020-11-29 ENCOUNTER — Other Ambulatory Visit: Payer: Self-pay | Admitting: Interventional Cardiology

## 2020-11-29 DIAGNOSIS — E1159 Type 2 diabetes mellitus with other circulatory complications: Secondary | ICD-10-CM

## 2020-12-03 ENCOUNTER — Other Ambulatory Visit: Payer: Self-pay | Admitting: Family Medicine

## 2020-12-03 DIAGNOSIS — E1159 Type 2 diabetes mellitus with other circulatory complications: Secondary | ICD-10-CM

## 2020-12-06 ENCOUNTER — Telehealth: Payer: Self-pay

## 2020-12-06 ENCOUNTER — Ambulatory Visit (INDEPENDENT_AMBULATORY_CARE_PROVIDER_SITE_OTHER): Payer: Medicare Other | Admitting: Pharmacist

## 2020-12-06 ENCOUNTER — Other Ambulatory Visit: Payer: Self-pay

## 2020-12-06 DIAGNOSIS — D6859 Other primary thrombophilia: Secondary | ICD-10-CM

## 2020-12-06 DIAGNOSIS — Z7901 Long term (current) use of anticoagulants: Secondary | ICD-10-CM | POA: Diagnosis not present

## 2020-12-06 DIAGNOSIS — Z86718 Personal history of other venous thrombosis and embolism: Secondary | ICD-10-CM

## 2020-12-06 DIAGNOSIS — I480 Paroxysmal atrial fibrillation: Secondary | ICD-10-CM

## 2020-12-06 LAB — COAGUCHEK XS/INR WAIVED
INR: 2.4 — ABNORMAL HIGH (ref 0.9–1.1)
Prothrombin Time: 28.4 s

## 2020-12-06 NOTE — Telephone Encounter (Signed)
Left patient a voicemail message to call back in regards to appointment on 12/09/20 @ 2:00pm with Ashlyn Bruning PA. Called to review AUA, meaningful use and prostate questions prior to the visit. TM

## 2020-12-06 NOTE — Progress Notes (Signed)
12/06/2020  Subjective:   Patient ID: Alan Heading., male    DOB: [birth year], 79 y.o.   MRN: [id number]  HPI: Alan Heesch. is a 79 y.o. male presenting on 12/06/2020 for INR recheck  HPI Coumadin recheck Target goal: 2.0-3.0 Reason on anticoagulation: A. fib and history of DVT Patient denies any bruising or bleeding or chest pain or palpitations Recent surgery on 11/09/20.  Patient is stable and doing well.  Relevant past medical, surgical, family and social history reviewed and updated as indicated. Interim medical history since our last visit reviewed. Allergies and medications reviewed and updated.   Objective:    Physical Exam Vitals and nursing note reviewed.  Constitutional:      General: He is not in acute distress.    Appearance: He is well-developed and well-nourished. He is not diaphoretic.  Eyes:     Extraocular Movements: EOM normal.  Cardiovascular:     Pulses: Intact distal pulses.  Musculoskeletal:        General: No edema.  Skin:    General: Skin is warm and dry.     Findings: No bruising or rash.  Neurological:     Mental Status: He is alert and oriented to person, place, and time.     Coordination: Coordination normal.  Psychiatric:        Mood and Affect: Mood and affect normal.        Behavior: Behavior normal.    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]        Other    Long term (current) use of anticoagulants [Z79.01]    History of DVT (deep vein thrombosis)       Description   Continue current dose to take 1 tablet (5MG ) every day of the week except for Wednesdays and Saturdays take 1 1/2 tablets (7.5MG ) INR was 2.4 (goal 2-3)  Follow-up 3-4 weeks      Anticoagulation Summary  As of 12/06/2020   INR goal:  2.0-3.0  TTR:  68.5 % (4.4 y)  INR used for dosing:    Warfarin maintenance plan:  7.5 mg (5 mg x 1.5) every Wed, Sat; 5 mg (5 mg x 1) all other days  Weekly warfarin total:  40 mg  No change documented:  Wed, [facility]  Plan last modified:  Dettinger, [facility], MD (10/11/2020)  Next INR check:  12/31/2020  Target end date:     Indications   Long term (current) use of anticoagulants [Z79.01] [Z79.01] 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   [phone]  Alan [name], PharmD, BCPS Clinical Pharmacist, [place]  II Phone [phone]

## 2020-12-06 NOTE — Progress Notes (Signed)
Patient has post seed appointment with Marcello Fennel PA. Patient states having nocturia 2-3 times per night. Patient states mild dysuria. Patient states occasional dysuria. Patient states urine stream is strong and steady. Patient states that he feels like he is emptying his bladder completely. Patient states having urgency and is not able to hold his urine. Patient denies having any leakage when coughing, sneezing or overnight. Patient denies pushing or straining.

## 2020-12-06 NOTE — Telephone Encounter (Signed)
Spoke with patient in regards to post seed appointment with Marcello Fennel PA on 12/09/20 @ 2:00pm. Patient verbalized understanding of appointment date and time. Reviewed AUA, meaningful use and prostate questions. TM

## 2020-12-08 ENCOUNTER — Telehealth: Payer: Self-pay | Admitting: *Deleted

## 2020-12-08 ENCOUNTER — Other Ambulatory Visit: Payer: Self-pay | Admitting: Family Medicine

## 2020-12-08 NOTE — Telephone Encounter (Signed)
Called patient to remind of post seed appts. for 12-09-20, no answer, no vm, unable to leave message due to no voice mail

## 2020-12-09 ENCOUNTER — Other Ambulatory Visit: Payer: Self-pay

## 2020-12-09 ENCOUNTER — Ambulatory Visit
Admission: RE | Admit: 2020-12-09 | Discharge: 2020-12-09 | Disposition: A | Discharge status: Home / Self Care | Payer: Medicare Other | Source: Ambulatory Visit | Attending: Radiation Oncology | Admitting: Radiation Oncology

## 2020-12-09 ENCOUNTER — Ambulatory Visit
Admission: RE | Admit: 2020-12-09 | Discharge: 2020-12-09 | Disposition: A | Discharge status: Home / Self Care | Payer: Medicare Other | Source: Ambulatory Visit | Attending: Urology | Admitting: Urology

## 2020-12-09 ENCOUNTER — Encounter: Payer: Self-pay | Admitting: Medical Oncology

## 2020-12-09 VITALS — BP 147/68 | HR 74 | Temp 97.7°F | Resp 18 | Ht 65.0 in | Wt 163.4 lb

## 2020-12-09 DIAGNOSIS — C61 Malignant neoplasm of prostate: Secondary | ICD-10-CM | POA: Diagnosis present

## 2020-12-09 NOTE — Progress Notes (Signed)
Radiation Oncology         [phone]) [phone] ________________________________  Name: Alan [name]. MRN: [id number]  Date: 12/09/2020  DOB: February 06, 1941  Post-Seed Follow-Up Visit Note  CC: Dettinger, Fransisca Kaufmann, MD  Irine Seal, MD  Diagnosis:   79 y.o. 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    ICD-10-CM   1. Prostate cancer ([place])  C61     Interval Since Last Radiation:  4 weeks 11/09/20:  Insertion of radioactive I-125 seeds into the prostate gland; 145 Gy, definitive therapy with placement of SpaceOAR VUE gel.  Narrative:  The patient returns today for routine follow-up.  He is complaining of increased urinary frequency and urinary hesitation symptoms. He filled out a questionnaire regarding urinary function today providing and overall IPSS score of 6 characterizing his symptoms as mild with nocturia x3, increased frequency and urgency.  His pre-implant score was 9. He denies any abdominal pain or bowel symptoms.  ALLERGIES:  is allergic to allopurinol and colchicine.  Meds: Current Outpatient Medications  Medication Sig Dispense Refill  . acetaminophen (TYLENOL) 500 MG tablet Take 500 mg by mouth every 6 (six) hours as needed.    Marland Kitchen amLODipine (NORVASC) 10 MG tablet TAKE 1 TABLET BY MOUTH ONCE DAILY IN THE EVENING 90 tablet 0  . Blood Glucose Monitoring Suppl (ONE TOUCH ULTRA MINI) w/Device KIT 1 each by Does not apply route 2 (two) times daily. 1 kit 1  . chlorthalidone (HYGROTON) 25 MG tablet Take 1 tablet by mouth once daily 90 tablet 3  . enoxaparin (LOVENOX) 120 MG/0.8ML injection Inject 0.8 mLs (120 mg total) into the skin at bedtime. 1.6 mL 0  . finasteride (PROSCAR) 5 MG tablet Take 1 tablet (5 mg total) by mouth daily. 90 tablet 3  . fluticasone (FLONASE) 50 MCG/ACT nasal spray Place 1 spray into both nostrils 2 (two) times daily as needed for allergies or rhinitis. 16 g 6  . glucose blood  (ONE TOUCH ULTRA TEST) test strip Use to check BG once daily.  Dx:  Type 2 DM controlled E11.9 100 each 4  . HYDROcodone-acetaminophen (NORCO/VICODIN) 5-325 MG tablet Take 1 tablet by mouth every 6 (six) hours as needed for moderate pain. 6 tablet 0  . lisinopril (ZESTRIL) 20 MG tablet TAKE 1 & 1/2 (ONE & ONE-HALF) TABLETS BY MOUTH ONCE DAILY 135 tablet 0  . metFORMIN (GLUCOPHAGE) 1000 MG tablet TAKE 1 TABLET BY MOUTH TWICE DAILY WITH A MEAL 180 tablet 0  . metoprolol succinate (TOPROL-XL) 100 MG 24 hr tablet TAKE 1 TABLET BY MOUTH ONCE DAILY TAKE  WITH  OR  IMMEDIATELY  FOLLOWING  A  MEAL 90 tablet 1  . Multiple Vitamins-Minerals (CENTRUM SILVER 50+MEN) TABS Take by mouth daily.    Marland Kitchen omeprazole (PRILOSEC) 20 MG capsule Take 1 capsule by mouth once daily 90 capsule 0  . potassium chloride SA (KLOR-CON) 20 MEQ tablet Take 1 tablet by mouth once daily 90 tablet 0  . pravastatin (PRAVACHOL) 40 MG tablet TAKE 1 TABLET BY MOUTH ONCE DAILY IN THE EVENING 90 tablet 0  . Sodium Sulfate-Mag Sulfate-KCl (SUTAB) [phone] MG TABS Take by mouth as needed.     . warfarin (COUMADIN) 5 MG tablet TAKE 1 & 1/2 (ONE & ONE-HALF) TABLETS BY MOUTH ONCE DAILY AT  6  PM 90 tablet 0  . Ferrous Sulfate (IRON) 325 (65 Fe) MG TABS Take 1 tablet by mouth  once daily with breakfast 90 tablet 0   No current facility-administered medications for this encounter.    Physical Findings: In general this is a well appearing African-American male in no acute distress. He's alert and oriented x4 and appropriate throughout the examination. Cardiopulmonary assessment is negative for acute distress and he exhibits normal effort.   Lab Findings: Lab Results  Component Value Date   WBC 5.9 11/08/2020   HGB 11.7 (L) 11/08/2020   HCT 35.7 (L) 11/08/2020   MCV 93.9 11/08/2020   PLT 204 11/08/2020    Radiographic Findings:  Patient underwent CT imaging in our clinic for post implant dosimetry. The CT will be reviewed by Dr. Tammi Klippel  to confirm there is an adequate distribution of radioactive seeds throughout the prostate gland and ensure that there are no seeds in or near the rectum. We suspect the final radiation plan and dosimetry will show appropriate coverage of the prostate gland. He understands that we will call and inform him of any unexpected findings on further review of his imaging and dosimetry.  Impression/Plan: 79 y.o. 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 The patient is recovering from the effects of radiation. His urinary symptoms should gradually improve over the next 4-6 months. We talked about this today. He is encouraged by his improvement already and is otherwise pleased with his outcome. We also talked about long-term follow-up for prostate cancer following seed implant. He understands that ongoing PSA determinations and digital rectal exams will help perform surveillance to rule out disease recurrence. He has a follow up appointment scheduled with Dr. Jeffie Pollock on 02/24/21. He understands what to expect with his PSA measures. Patient was also educated today about some of the long-term effects from radiation including a small risk for rectal bleeding and possibly erectile dysfunction. We talked about some of the general management approaches to these potential complications. However, I did encourage the patient to contact our office or return at any point if he has questions or concerns related to his previous radiation and prostate cancer.    Nicholos Johns, PA-C

## 2020-12-09 NOTE — Progress Notes (Signed)
  Radiation Oncology         [phone]) [phone] ________________________________  Name: Alan [name]. MRN: [id number]  Date: 12/09/2020  DOB: 1941-12-06  COMPLEX SIMULATION NOTE  NARRATIVE:  The patient was brought to the CT Simulation planning suite today following prostate seed implantation approximately one month ago.  Identity was confirmed.  All relevant records and images related to the planned course of therapy were reviewed.  Then, the patient was set-up supine.  CT images were obtained.  The CT images were loaded into the planning software.  Then the prostate and rectum were contoured.  Treatment planning then occurred.  The implanted iodine 125 seeds were identified by the physics staff for projection of radiation distribution  I have requested : 3D Simulation  I have requested a DVH of the following structures: Prostate and rectum.    ________________________________  Artist Pais Kathrynn Running, M.D.

## 2020-12-10 ENCOUNTER — Encounter: Payer: Self-pay | Admitting: Radiation Oncology

## 2020-12-10 DIAGNOSIS — C61 Malignant neoplasm of prostate: Secondary | ICD-10-CM | POA: Diagnosis not present

## 2020-12-10 NOTE — Progress Notes (Signed)
  Radiation Oncology         [phone]) [phone] ________________________________  Name: Alan [name]. MRN: [id number]  Date: 12/10/2020  DOB: 10-25-1941  3D Planning Note   Prostate Brachytherapy Post-Implant Dosimetry  Diagnosis: 79 y.o. 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  Narrative: On a previous date, Alan [name]. returned following prostate seed implantation for post implant planning. He underwent CT scan complex simulation to delineate the three-dimensional structures of the pelvis and demonstrate the radiation distribution.  Since that time, the seed localization, and complex isodose planning with dose volume histograms have now been completed.  Results:   Prostate Coverage - The dose of radiation delivered to the 90% or more of the prostate gland (D90) was 97.92% of the prescription dose. This exceeds our goal of greater than 90%. Rectal Sparing - The volume of rectal tissue receiving the prescription dose or higher was 0.08 cc. This falls under our thresholds tolerance of 1.0 cc.  Impression: The prostate seed implant appears to show adequate target coverage and appropriate rectal sparing.  Plan:  The patient will continue to follow with urology for ongoing PSA determinations. I would anticipate a high likelihood for local tumor control with minimal risk for rectal morbidity.  ________________________________  Artist Pais Kathrynn Running, M.D.

## 2020-12-13 NOTE — Progress Notes (Deleted)
Cardiology Office Note:    Date:  12/13/2020   ID:  Alan Heading., DOB December 14, 1940, MRN [id number]  PCP:  Dettinger, Elige Radon, MD  Cardiologist:  Lesleigh Noe, MD   Referring MD: Dettinger, Elige Radon, MD   No chief complaint on file.   History of Present Illness:    Alan Colucci. is a 80 y.o. male with a hx of hypertrophic cardiomyopathy, essential hypertension, chronic anticoagulation therapy, and paroxysmal atrial fibrillation.  ***  Past Medical History:  Diagnosis Date  . Anticoagulated on Coumadin    managed by pcp  . Arthritis   . BPH with urinary obstruction   . Diverticulosis of colon   . ED (erectile dysfunction)   . 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 GERD (gastroesophageal reflux disease)   . Heart murmur   . History of acute pancreatitis    2016   necrotizing pancreatitis  . History of adenomatous polyp of colon   . History of DVT of lower extremity   . History of GI bleed 08/2019   upper gi bleed due to duodenal ulcer  . History of gout yrs ago  . Hypertension    followed by pcp  . Hypertrophic cardiomyopathy (HCC)    followed by cardiology  . Internal hemorrhoids   . Mixed hyperlipidemia   . Normocytic anemia   . PAF (paroxysmal atrial fibrillation) (HCC) 04/2016   cardiologist-- dr h. Katrinka Blazing--  event monitor 07-04-2017 epic, NSR/ PAflutter with occasional RVR/  PACs/ PVCs ;  echo 05-16-2017 epic,  moderate LVH with severe asymptomic septal hypertrophy, G1DD, EF 60-65%,  mild AR, mild LAE  . Pancreatic pseudocyst   . Peyronie's disease   . Prostate cancer [facility]) urologist-- dr wrenn/ oncologist-- dr Kathrynn Running   first dx 08/ 2009 Gleason 3+3 active survelliance;  until bx 07/ 2021  Stage T2b, Gleason 4+3  . Right inguinal hernia   . Type 2 diabetes mellitus (HCC)    followed by pcp---  (11-03-2020 per pt  currently not checking blood sugar due to glucose monitor broken)  . Wears glasses   . Wears partial dentures    upper and lower    Past Surgical History:  Procedure Laterality Date  . CHOLECYSTECTOMY N/A 11/26/2015   Procedure: LAPAROSCOPIC CHOLECYSTECTOMY;  Surgeon: Franky Macho, MD;  Location: [facility];  Service: General;  Laterality: N/A;  . COLONOSCOPY  last one 09-06-2020  dr stark  . CYSTOSCOPY N/A 11/09/2020   Procedure: CYSTOSCOPY FLEXIBLE;  Surgeon: Bjorn Pippin, MD;  Location: [facility];  Service: Urology;  Laterality: N/A;  . ESOPHAGOGASTRODUODENOSCOPY  10-06-2019  @[facility]   . FLEXIBLE BRONCHOSCOPY Bilateral 04/14/2016   Procedure: FLEXIBLE BRONCHOSCOPY;  Surgeon: 06/14/2016, MD;  Location: [facility];  Service: Cardiopulmonary;  Laterality: Bilateral;  . HEMORRHOID SURGERY  yrs ago  . RADIOACTIVE SEED IMPLANT N/A 11/09/2020   Procedure: RADIOACTIVE SEED IMPLANT/BRACHYTHERAPY IMPLANT;  Surgeon: 11/11/2020, MD;  Location: [facility];  Service: Urology;  Laterality: N/A;  . SPACE OAR INSTILLATION N/A 11/09/2020   Procedure: SPACE OAR INSTILLATION;  Surgeon: 11/11/2020, MD;  Location: [facility];  Service: Urology;  Laterality: N/A;  . UMBILICAL HERNIA REPAIR N/A 11/26/2015   Procedure: UMBILICAL HERNIORRHAPHY;  Surgeon: 11/28/2015, MD;  Location: [facility];  Service: General;  Laterality: N/A;  Current Medications: No outpatient medications have been marked as taking for the 12/17/20 encounter (Appointment) with Belva Crome, MD.     Allergies:   Allopurinol and Colchicine   Social History   Socioeconomic History  . Marital status: Married    Spouse name: Arville Go  . Number of children: 4  . Years of education: Not on file  . Highest education level: 10th grade  Occupational History  . Occupation: Retired     Fish farm manager: [employer]  Tobacco Use  . Smoking status: Former Smoker    Years: 5.00    Types: Cigarettes     Quit date: 12/11/1986    Years since quitting: 34.0  . Smokeless tobacco: Never Used  Vaping Use  . Vaping Use: Never used  Substance and Sexual Activity  . Alcohol use: No  . Drug use: Never  . Sexual activity: Yes  Other Topics Concern  . Not on file  Social History Narrative  . Not on file   Social Determinants of Health   Financial Resource Strain: Not on file  Food Insecurity: Not on file  Transportation Needs: Not on file  Physical Activity: Not on file  Stress: Not on file  Social Connections: Not on file     Family History: The patient's family history includes Alzheimer's disease in his mother; Brain cancer in his sister; Breast cancer in his sister; Deep vein thrombosis in his brother; Hypertension in his brother, father, mother, and sister; Stroke in his mother. There is no history of Colon cancer, Pancreatic disease, Esophageal cancer, Rectal cancer, or Stomach cancer.  ROS:   Please see the history of present illness.    *** All other systems reviewed and are negative.  EKGs/Labs/Other Studies Reviewed:    The following studies were reviewed today: ***  EKG:  EKG ***  Recent Labs: 11/08/2020: ALT 16; BUN 21; Creatinine, Ser 1.11; Hemoglobin 11.7; Platelets 204; Potassium 3.7; Sodium 141  Recent Lipid Panel    Component Value Date/Time   CHOL 104 06/16/2020 1215   CHOL 111 04/29/2013 1017   TRIG 234 (H) 06/16/2020 1215   TRIG 162 (H) 11/05/2013 1306   TRIG 236 (H) 04/29/2013 1017   HDL 25 (L) 06/16/2020 1215   HDL 33 (L) 11/05/2013 1306   HDL 31 (L) 04/29/2013 1017   CHOLHDL 4.2 06/16/2020 1215   LDLCALC 42 06/16/2020 1215   LDLCALC 44 11/05/2013 1306   LDLCALC 33 04/29/2013 1017    Physical Exam:    VS:  There were no vitals taken for this visit.    Wt Readings from Last 3 Encounters:  12/09/20 163 lb 6 oz (74.1 kg)  11/26/20 170 lb (77.1 kg)  11/19/20 170 lb (77.1 kg)     GEN: ***. No acute distress HEENT: Normal NECK: No  JVD. LYMPHATICS: No lymphadenopathy CARDIAC: *** murmur. RRR *** gallop, or edema. VASCULAR: *** Normal Pulses. No bruits. RESPIRATORY:  Clear to auscultation without rales, wheezing or rhonchi  ABDOMEN: Soft, non-tender, non-distended, No pulsatile mass, MUSCULOSKELETAL: No deformity  SKIN: Warm and dry NEUROLOGIC:  Alert and oriented x 3 PSYCHIATRIC:  Normal affect   ASSESSMENT:    1. Paroxysmal atrial fibrillation (HCC)   2. Long term (current) use of anticoagulants   3. Hypertension associated with diabetes ([place])   4. Hypertrophic cardiomyopathy ([place])   5. Essential hypertension   6. Hyperlipidemia associated with type 2 diabetes mellitus ([place])   7. Type 2 diabetes mellitus without complication, without long-term current use  of insulin ([place])   8. Educated about COVID-19 virus infection    PLAN:    In order of problems listed above:  1. ***   Medication Adjustments/Labs and Tests Ordered: Current medicines are reviewed at length with the patient today.  Concerns regarding medicines are outlined above.  No orders of the defined types were placed in this encounter.  No orders of the defined types were placed in this encounter.   There are no Patient Instructions on file for this visit.   Signed, Alan Grooms, MD  12/13/2020 7:33 PM    [place]

## 2020-12-15 ENCOUNTER — Ambulatory Visit: Payer: Medicare Other | Admitting: Licensed Clinical Social Worker

## 2020-12-15 DIAGNOSIS — I480 Paroxysmal atrial fibrillation: Secondary | ICD-10-CM

## 2020-12-15 DIAGNOSIS — K219 Gastro-esophageal reflux disease without esophagitis: Secondary | ICD-10-CM

## 2020-12-15 DIAGNOSIS — I152 Hypertension secondary to endocrine disorders: Secondary | ICD-10-CM

## 2020-12-15 DIAGNOSIS — C61 Malignant neoplasm of prostate: Secondary | ICD-10-CM

## 2020-12-15 DIAGNOSIS — E1169 Type 2 diabetes mellitus with other specified complication: Secondary | ICD-10-CM

## 2020-12-15 DIAGNOSIS — E785 Hyperlipidemia, unspecified: Secondary | ICD-10-CM

## 2020-12-15 DIAGNOSIS — Z86718 Personal history of other venous thrombosis and embolism: Secondary | ICD-10-CM

## 2020-12-15 DIAGNOSIS — M109 Gout, unspecified: Secondary | ICD-10-CM

## 2020-12-15 NOTE — Chronic Care Management (AMB) (Cosign Needed)
Chronic Care Management    Clinical Social Work Follow Up Note  12/15/2020 Name: Alan [name]. MRN: [id number] DOB: 1941/06/23  Alan Netters. is a 80 y.o. year old male who is a primary care patient of Dettinger, Fransisca Kaufmann, MD. The CCM team was consulted for assistance with [employer] .   Review of patient status, including review of consultants reports, other relevant assessments, and collaboration with appropriate care team members and the patient's provider was performed as part of comprehensive patient evaluation and provision of chronic care management services.    SDOH (Social Determinants of Health) assessments performed: No; risk for tobacco use; risk for depression; risk for stress; risk for physical inactivity  Flowsheet Row Chronic Care Management from 06/22/2020 in [place]  PHQ-9 Total Score 2     GAD 7 : Generalized Anxiety Score 06/22/2020  Nervous, Anxious, on Edge 0  Control/stop worrying 0  Worry too much - different things 0  Trouble relaxing 0  Restless 0  Easily annoyed or irritable 0  Afraid - awful might happen 0  Total GAD 7 Score 0  Anxiety Difficulty Not difficult at all    Outpatient Encounter Medications as of 12/15/2020  Medication Sig  . acetaminophen (TYLENOL) 500 MG tablet Take 500 mg by mouth every 6 (six) hours as needed.  Marland Kitchen amLODipine (NORVASC) 10 MG tablet TAKE 1 TABLET BY MOUTH ONCE DAILY IN THE EVENING  . Blood Glucose Monitoring Suppl (ONE TOUCH ULTRA MINI) w/Device KIT 1 each by Does not apply route 2 (two) times daily.  . chlorthalidone (HYGROTON) 25 MG tablet Take 1 tablet by mouth once daily  . enoxaparin (LOVENOX) 120 MG/0.8ML injection Inject 0.8 mLs (120 mg total) into the skin at bedtime.  . Ferrous Sulfate (IRON) 325 (65 Fe) MG TABS Take 1 tablet by mouth once daily with breakfast  . finasteride (PROSCAR) 5 MG tablet Take 1 tablet (5 mg total) by mouth daily.  . fluticasone (FLONASE) 50 MCG/ACT nasal  spray Place 1 spray into both nostrils 2 (two) times daily as needed for allergies or rhinitis.  Marland Kitchen glucose blood (ONE TOUCH ULTRA TEST) test strip Use to check BG once daily.  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 HYDROcodone-acetaminophen (NORCO/VICODIN) 5-325 MG tablet Take 1 tablet by mouth every 6 (six) hours as needed for moderate pain.  Marland Kitchen lisinopril (ZESTRIL) 20 MG tablet TAKE 1 & 1/2 (ONE & ONE-HALF) TABLETS BY MOUTH ONCE DAILY  . metFORMIN (GLUCOPHAGE) 1000 MG tablet TAKE 1 TABLET BY MOUTH TWICE DAILY WITH A MEAL  . metoprolol succinate (TOPROL-XL) 100 MG 24 hr tablet TAKE 1 TABLET BY MOUTH ONCE DAILY TAKE  WITH  OR  IMMEDIATELY  FOLLOWING  A  MEAL  . Multiple Vitamins-Minerals (CENTRUM SILVER 50+MEN) TABS Take by mouth daily.  Marland Kitchen omeprazole (PRILOSEC) 20 MG capsule Take 1 capsule by mouth once daily  . potassium chloride SA (KLOR-CON) 20 MEQ tablet Take 1 tablet by mouth once daily  . pravastatin (PRAVACHOL) 40 MG tablet TAKE 1 TABLET BY MOUTH ONCE DAILY IN THE EVENING  . Sodium Sulfate-Mag Sulfate-KCl (SUTAB) 514 048 1063 MG TABS Take by mouth as needed.   . warfarin (COUMADIN) 5 MG tablet TAKE 1 & 1/2 (ONE & ONE-HALF) TABLETS BY MOUTH ONCE DAILY AT  6  PM   No facility-administered encounter medications on file as of 12/15/2020.    LCSW called home number for client today but LCSW was not able to speak via  phone today with client or with spouse of client. LCSW did leave phone message for client and for spouse of client requesting return call to LCSW at [phone]  Follow Up Plan: LCSW to call client or Alan [name], spouse of client, in next 4 weeks to discuss health needs of client and to discuss client completion of daily activities  Norva Riffle.Safari Cinque MSW, LCSW Licensed Clinical Social Worker [facility] Care Management [phone]

## 2020-12-15 NOTE — Patient Instructions (Addendum)
Licensed Clinical Social Worker Visit Information  Materials Provided: No  12/15/2020  Name: Alan [name].            MRN: [id number]       DOB: 04-19-1941  Alan [name]. is a 80 y.o. year old male who is a primary care patient of Dettinger, Elige Radon, MD. The CCM team was consulted for assistance with [employer] .   Review of patient status, including review of consultants reports, other relevant assessments, and collaboration with appropriate care team members and the patient's provider was performed as part of comprehensive patient evaluation and provision of chronic care management services.    SDOH (Social Determinants of Health) assessments performed: No; risk for tobacco use; risk for depression; risk for stress; risk for physical inactivity  LCSW called home number for client today but LCSW was not able to speak via phone today with client or with spouse of client. LCSW did leave phone message for client and for spouse of client requesting return call to LCSW at [phone]  Follow Up Plan:LCSW to call client or Alan [name], spouse of client, in next 4 weeks to discuss health needs of client and to discuss client completion of daily activities  LCSW was not able to speak via phone today with client or with spouse of client; thus the patient or spouse of patient were not able to verbalize understanding of instructions provided today and were not able to accept or decline a print copy of patient instruction materials.    Kelton Pillar.Stylianos Stradling MSW, LCSW Licensed Clinical Social Worker [facility] Care Management [phone]

## 2020-12-17 ENCOUNTER — Ambulatory Visit: Payer: Medicare Other | Admitting: Interventional Cardiology

## 2020-12-17 DIAGNOSIS — E1159 Type 2 diabetes mellitus with other circulatory complications: Secondary | ICD-10-CM

## 2020-12-17 DIAGNOSIS — E785 Hyperlipidemia, unspecified: Secondary | ICD-10-CM

## 2020-12-17 DIAGNOSIS — I422 Other hypertrophic cardiomyopathy: Secondary | ICD-10-CM

## 2020-12-17 DIAGNOSIS — E119 Type 2 diabetes mellitus without complications: Secondary | ICD-10-CM

## 2020-12-17 DIAGNOSIS — Z7189 Other specified counseling: Secondary | ICD-10-CM

## 2020-12-17 DIAGNOSIS — I10 Essential (primary) hypertension: Secondary | ICD-10-CM

## 2020-12-17 DIAGNOSIS — Z7901 Long term (current) use of anticoagulants: Secondary | ICD-10-CM

## 2020-12-17 DIAGNOSIS — I480 Paroxysmal atrial fibrillation: Secondary | ICD-10-CM

## 2020-12-30 ENCOUNTER — Other Ambulatory Visit: Payer: Self-pay | Admitting: Family Medicine

## 2020-12-30 DIAGNOSIS — K219 Gastro-esophageal reflux disease without esophagitis: Secondary | ICD-10-CM

## 2020-12-31 ENCOUNTER — Ambulatory Visit (INDEPENDENT_AMBULATORY_CARE_PROVIDER_SITE_OTHER): Payer: Medicare Other | Admitting: Family Medicine

## 2020-12-31 ENCOUNTER — Ambulatory Visit: Payer: Medicare Other | Admitting: Pharmacist

## 2020-12-31 ENCOUNTER — Encounter: Payer: Self-pay | Admitting: Family Medicine

## 2020-12-31 ENCOUNTER — Other Ambulatory Visit: Payer: Self-pay

## 2020-12-31 VITALS — BP 134/57 | HR 51 | Temp 97.7°F | Resp 20 | Ht 65.0 in | Wt 166.0 lb

## 2020-12-31 DIAGNOSIS — I480 Paroxysmal atrial fibrillation: Secondary | ICD-10-CM

## 2020-12-31 DIAGNOSIS — D6859 Other primary thrombophilia: Secondary | ICD-10-CM | POA: Diagnosis not present

## 2020-12-31 DIAGNOSIS — Z7901 Long term (current) use of anticoagulants: Secondary | ICD-10-CM

## 2020-12-31 DIAGNOSIS — Z86718 Personal history of other venous thrombosis and embolism: Secondary | ICD-10-CM | POA: Diagnosis not present

## 2020-12-31 LAB — COAGUCHEK XS/INR WAIVED
INR: 1.3 — ABNORMAL HIGH (ref 0.9–1.1)
Prothrombin Time: 16.1 s

## 2020-12-31 NOTE — Progress Notes (Signed)
BP (!) 134/57   Pulse (!) 51   Temp 97.7 F (36.5 C)   Resp 20   Ht '5\' 5"'  (1.651 m)   Wt 166 lb (75.3 kg)   SpO2 100%   BMI 27.62 kg/m    Subjective:   Patient ID: Alan Netters., male    DOB: [birth year], 80 y.o.   MRN: [id number]  HPI: Alan Deeg. is a 80 y.o. male presenting on 12/31/2020 for No chief complaint on file.   HPI Coumadin recheck Target goal: 2.0-3.0 Reason on anticoagulation: Hypercoagulable state and A. fib and history of DVTs Patient denies any bruising or bleeding or chest pain or palpitations  Patient denies any major change in diet or increasing fruits and vegetables or anything that would have changed recently.  Relevant past medical, surgical, family and social history reviewed and updated as indicated. Interim medical history since our last visit reviewed. Allergies and medications reviewed and updated.  Review of Systems  Constitutional: Negative for chills and fever.  Eyes: Negative for visual disturbance.  Respiratory: Negative for shortness of breath and wheezing.   Cardiovascular: Negative for chest pain and leg swelling.  Genitourinary: Positive for hematuria (He still passes a few blood clots in his urine every now and then since his prostate surgery in December).  Musculoskeletal: Negative for back pain and gait problem.  Skin: Negative for rash.  All other systems reviewed and are negative.   Per HPI unless specifically indicated above   Allergies as of 12/31/2020      Reactions   Allopurinol Other (See Comments)   Significantly decreased WBC's   Colchicine Other (See Comments)   Significantly decreased WBC's      Medication List       Accurate as of December 31, 2020  9:08 AM. If you have any questions, ask your nurse or doctor.        acetaminophen 500 MG tablet Commonly known as: TYLENOL Take 500 mg by mouth every 6 (six) hours as needed.   amLODipine 10 MG tablet Commonly known as: NORVASC TAKE 1 TABLET BY MOUTH  ONCE DAILY IN THE EVENING   Centrum Silver 50+Men Tabs Take by mouth daily.   chlorthalidone 25 MG tablet Commonly known as: HYGROTON Take 1 tablet by mouth once daily   enoxaparin 120 MG/0.8ML injection Commonly known as: Lovenox Inject 0.8 mLs (120 mg total) into the skin at bedtime.   finasteride 5 MG tablet Commonly known as: PROSCAR Take 1 tablet (5 mg total) by mouth daily.   fluticasone 50 MCG/ACT nasal spray Commonly known as: FLONASE Place 1 spray into both nostrils 2 (two) times daily as needed for allergies or rhinitis.   glucose blood test strip Commonly known as: ONE TOUCH ULTRA TEST Use to check BG once daily.  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   Iron 325 (65 Fe) MG Tabs Take 1 tablet by mouth once daily with breakfast   lisinopril 20 MG tablet Commonly known as: ZESTRIL TAKE 1 & 1/2 (ONE & ONE-HALF) TABLETS BY MOUTH ONCE DAILY   metFORMIN 1000 MG tablet Commonly known as: GLUCOPHAGE TAKE 1 TABLET BY MOUTH TWICE DAILY WITH A MEAL   metoprolol succinate 100 MG 24 hr tablet Commonly known as: TOPROL-XL TAKE 1 TABLET BY MOUTH ONCE DAILY TAKE  WITH  OR  IMMEDIATELY  FOLLOWING  A  MEAL   omeprazole 20 MG capsule Commonly known as: PRILOSEC Take 1 capsule by mouth once daily   ONE TOUCH ULTRA MINI w/Device Kit 1 each by Does not apply route 2 (two) times daily.   potassium chloride SA 20 MEQ tablet Commonly known as: KLOR-CON Take 1 tablet by mouth once daily   pravastatin 40 MG tablet Commonly known as: PRAVACHOL TAKE 1 TABLET BY MOUTH ONCE DAILY IN THE EVENING   Sutab [phone] MG Tabs Generic drug: Sodium Sulfate-Mag Sulfate-KCl Take by mouth as needed.   warfarin 5 MG tablet Commonly known as: COUMADIN Take as directed by the anticoagulation clinic. If you are unsure how to take this  medication, talk to your nurse or doctor. Original instructions: TAKE 1 & 1/2 (ONE & ONE-HALF) TABLETS BY MOUTH ONCE DAILY AT  6  PM        Objective:   There were no vitals taken for this visit.  Wt Readings from Last 3 Encounters:  12/09/20 163 lb 6 oz (74.1 kg)  11/26/20 170 lb (77.1 kg)  11/19/20 170 lb (77.1 kg)    Physical Exam Vitals and nursing note reviewed.  Constitutional:      General: He is not in acute distress.    Appearance: He is well-developed and well-nourished. He is not diaphoretic.  Eyes:     General: No scleral icterus.    Extraocular Movements: EOM normal.     Conjunctiva/sclera: Conjunctivae normal.  Neck:     Thyroid: No thyromegaly.  Cardiovascular:     Pulses: Intact distal pulses.  Musculoskeletal:        General: No edema.  Neurological:     Mental Status: He is alert and oriented to person, place, and time.     Coordination: Coordination normal.  Psychiatric:        Mood and Affect: Mood and affect normal.        Behavior: Behavior normal.     Description   Take 2- 7m tablets today and then increase current dose to take 1 tablet every day of the week except for Mondays, Wednesdays and Saturdays take 1 1/2 tablets  INR was 1.3 (goal 2-3)   Follow-up 1-2 weeks      Assessment & Plan:   Problem List Items Addressed This Visit      Cardiovascular and Mediastinum   Paroxysmal atrial fibrillation ([place] - Primary   Relevant Orders   CoaguChek XS/INR Waived     Hematopoietic and Hemostatic   Primary hypercoagulable state ([place] [D68.59]   Relevant Orders   CoaguChek XS/INR Waived     Other   Long term (current) use of anticoagulants [Z79.01]   History of DVT (deep vein thrombosis)       Follow up plan: Return if symptoms worsen or fail to improve, for 1 to 2-week INR.  Counseling provided for all of the vaccine components Orders Placed This Encounter  Procedures  . CoaguChek XS/INR [place]  MD [facility] 12/31/2020, 9:08 AM

## 2021-01-06 NOTE — Progress Notes (Signed)
Cardiology Office Note:    Date:  01/07/2021   ID:  Alan [name]., DOB 1941/08/16, MRN [id number]  PCP:  Alan [name], Alan Kaufmann, MD  Cardiologist:  Alan Grooms, MD   Referring MD: Alan [name], Alan Kaufmann, MD   Chief Complaint  Patient presents with  . Follow-up    Hypertrophic cardiomyopathy  . Atrial Fibrillation    History of Present Illness:    Trevell [name]. is a 80 y.o. male with a hx of hypertrophic cardiomyopathy, essential hypertension, chronic anticoagulation therapy, and paroxysmal atrial fibrillation.  He is doing well.  He is not exhibiting any cardiac symptoms.  He has a large left direct inguinal hernia.  He has seen surgery about it but decided against surgery.  He denies orthopnea, PND, palpitations, syncope, and edema.  Past Medical History:  Diagnosis Date  . Anticoagulated on Coumadin    managed by pcp  . Arthritis   . BPH with urinary obstruction   . Diverticulosis of colon   . ED (erectile dysfunction)   . 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 GERD (gastroesophageal reflux disease)   . Heart murmur   . History of acute pancreatitis    2016   necrotizing pancreatitis  . History of adenomatous polyp of colon   . History of DVT of lower extremity   . History of GI bleed 08/2019   upper gi bleed due to duodenal ulcer  . History of gout yrs ago  . Hypertension    followed by pcp  . Hypertrophic cardiomyopathy ([place])    followed by cardiology  . Internal hemorrhoids   . Mixed hyperlipidemia   . Normocytic anemia   . PAF (paroxysmal atrial fibrillation) ([place]) 04/2016   cardiologist-- Alan [name]--  event monitor 07-04-2017 epic, NSR/ PAflutter with occasional RVR/  PACs/ PVCs ;  echo 05-16-2017 epic,  moderate LVH with severe asymptomic septal hypertrophy, G1DD, EF 60-65%,  mild AR, mild LAE  . Pancreatic  pseudocyst   . Peyronie's disease   . Prostate cancer [facility]) urologist-- Alan Alan [name]/ oncologist-- Alan Tammi [name]   first dx 08/ 2009 Gleason 3+3 active survelliance;  until bx 07/ 2021  Stage T2b, Gleason 4+3  . Right inguinal hernia   . Type 2 diabetes mellitus ([place])    followed by pcp---  (11-03-2020 per pt currently not checking blood sugar due to glucose monitor broken)  . Wears glasses   . Wears partial dentures    upper and lower    Past Surgical History:  Procedure Laterality Date  . CHOLECYSTECTOMY N/A 11/26/2015   Procedure: LAPAROSCOPIC CHOLECYSTECTOMY;  Surgeon: Alan Signs, MD;  Location: [facility];  Service: General;  Laterality: N/A;  . COLONOSCOPY  last one 09-06-2020  Alan Alan [name]  . CYSTOSCOPY N/A 11/09/2020   Procedure: CYSTOSCOPY FLEXIBLE;  Surgeon: Alan Seal, MD;  Location: [facility];  Service: Urology;  Laterality: N/A;  . ESOPHAGOGASTRODUODENOSCOPY  10-06-2019  _0   . FLEXIBLE BRONCHOSCOPY Bilateral 04/14/2016   Procedure: FLEXIBLE BRONCHOSCOPY;  Surgeon: Alan Du, MD;  Location: [facility];  Service: Cardiopulmonary;  Laterality: Bilateral;  . HEMORRHOID SURGERY  yrs ago  . RADIOACTIVE SEED IMPLANT N/A 11/09/2020   Procedure: RADIOACTIVE SEED IMPLANT/BRACHYTHERAPY IMPLANT;  Surgeon: Alan Seal, MD;  Location: [facility];  Service: Urology;  Laterality: N/A;  . SPACE  OAR INSTILLATION N/A 11/09/2020   Procedure: SPACE OAR INSTILLATION;  Surgeon: Alan [name], Coreon, MD;  Location: [facility];  Service: Urology;  Laterality: N/A;  . UMBILICAL HERNIA REPAIR N/A 11/26/2015   Procedure: UMBILICAL HERNIORRHAPHY;  Surgeon: Alan Jenkins, MD;  Location: [facility];  Service: General;  Laterality: N/A;    Current Medications: Current Meds  Medication Sig  . acetaminophen (TYLENOL) 500 MG tablet Take 500 mg by mouth every 6 (six) hours as needed.  . amLODipine (NORVASC) 10 MG tablet TAKE 1 TABLET BY MOUTH ONCE DAILY IN THE EVENING  .  Blood Glucose Monitoring Suppl (ONE TOUCH ULTRA MINI) w/Device KIT 1 each by Does not apply route 2 (two) times daily.  . chlorthalidone (HYGROTON) 25 MG tablet Take 1 tablet by mouth once daily  . enoxaparin (LOVENOX) 120 MG/0.8ML injection Inject 0.8 mLs (120 mg total) into the skin at bedtime.  . Ferrous Sulfate (IRON) 325 (65 Fe) MG TABS Take 1 tablet by mouth once daily with breakfast  . finasteride (PROSCAR) 5 MG tablet Take 1 tablet (5 mg total) by mouth daily.  . fluticasone (FLONASE) 50 MCG/ACT nasal spray Place 1 spray into both nostrils 2 (two) times daily as needed for allergies or rhinitis.  . glucose blood (ONE TOUCH ULTRA TEST) test strip Use to check BG once daily.  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 HYDROcodone-acetaminophen (NORCO/VICODIN) 5-325 MG tablet Take 1 tablet by mouth every 6 (six) hours as needed for moderate pain.  . lisinopril (ZESTRIL) 20 MG tablet TAKE 1 & 1/2 (ONE & ONE-HALF) TABLETS BY MOUTH ONCE DAILY  . metFORMIN (GLUCOPHAGE) 1000 MG tablet TAKE 1 TABLET BY MOUTH TWICE DAILY WITH A MEAL  . metoprolol succinate (TOPROL-XL) 100 MG 24 hr tablet TAKE 1 TABLET BY MOUTH ONCE DAILY TAKE  WITH  OR  IMMEDIATELY  FOLLOWING  A  MEAL  . Multiple Vitamins-Minerals (CENTRUM SILVER 50+MEN) TABS Take by mouth daily.  . omeprazole (PRILOSEC) 20 MG capsule Take 1 capsule by mouth once daily  . potassium chloride SA (KLOR-CON) 20 MEQ tablet Take 1 tablet by mouth once daily  . pravastatin (PRAVACHOL) 40 MG tablet TAKE 1 TABLET BY MOUTH ONCE DAILY IN THE EVENING  . Sodium Sulfate-Mag Sulfate-KCl (SUTAB) 1479-225-188 MG TABS Take by mouth as needed.   . warfarin (COUMADIN) 5 MG tablet TAKE 1 & 1/2 (ONE & ONE-HALF) TABLETS BY MOUTH ONCE DAILY AT  6  PM     Allergies:   Allopurinol and Colchicine   Social History   Socioeconomic History  . Marital status: Married    Spouse name: Alan [name]  . Number of children: 4  . Years of education: Not on file  . Highest education level:  10th grade  Occupational History  . Occupation: Retired     Employer: [employer]  Tobacco Use  . Smoking status: Former Smoker    Years: 5.00    Types: Cigarettes    Quit date: 12/11/1986    Years since quitting: 34.0  . Smokeless tobacco: Never Used  Vaping Use  . Vaping Use: Never used  Substance and Sexual Activity  . Alcohol use: No  . Drug use: Never  . Sexual activity: Yes  Other Topics Concern  . Not on file  Social History Narrative  . Not on file   Social Determinants of Health   Financial Resource Strain: Not on file  Food Insecurity: Not on file  Transportation Needs: Not on file    Physical Activity: Not on file  Stress: Not on file  Social Connections: Not on file     Family History: The patient's family history includes Alzheimer's disease in his mother; Brain cancer in his sister; Breast cancer in his sister; Deep vein thrombosis in his brother; Hypertension in his brother, father, mother, and sister; Stroke in his mother. There is no history of Colon cancer, Pancreatic disease, Esophageal cancer, Rectal cancer, or Stomach cancer.  ROS:   Please see the history of present illness.    Large left hernia.  Other than the size there is no problem.  He denies pain.  He has done okay since his prostate seed implant.  All other systems reviewed and are negative.  EKGs/Labs/Other Studies Reviewed:    The following studies were reviewed today: No imaging studies since 2018.  EKG:  EKG performed 10/18/2020 demonstrates regular hypertrophy with increased voltage and dramatic precordial T wave inversion.  Recent Labs: 11/08/2020: ALT 16; BUN 21; Creatinine, Ser 1.11; Hemoglobin 11.7; Platelets 204; Potassium 3.7; Sodium 141  Recent Lipid Panel    Component Value Date/Time   CHOL 104 06/16/2020 1215   CHOL 111 04/29/2013 1017   TRIG 234 (H) 06/16/2020 1215   TRIG 162 (H) 11/05/2013 1306   TRIG 236 (H) 04/29/2013 1017   HDL 25 (L) 06/16/2020 1215   HDL 33 (L)  11/05/2013 1306   HDL 31 (L) 04/29/2013 1017   CHOLHDL 4.2 06/16/2020 1215   LDLCALC 42 06/16/2020 1215   LDLCALC 44 11/05/2013 1306   LDLCALC 33 04/29/2013 1017    Physical Exam:    VS:  BP (!) 120/50 (BP Location: Left Arm, Patient Position: Sitting, Cuff Size: Normal)   Pulse 60   Ht 5' 5" (1.651 m)   Wt 166 lb (75.3 kg)   SpO2 97%   BMI 27.62 kg/m     Wt Readings from Last 3 Encounters:  01/07/21 166 lb (75.3 kg)  12/31/20 166 lb (75.3 kg)  12/09/20 163 lb 6 oz (74.1 kg)     GEN: Elderly and somewhat frail in appearance.. No acute distress HEENT: Normal NECK: No JVD. LYMPHATICS: No lymphadenopathy CARDIAC: No murmur. RRR S4 but no S3 gallop, or edema. VASCULAR:  Normal Pulses. No bruits. RESPIRATORY:  Clear to auscultation without rales, wheezing or rhonchi  ABDOMEN: Soft, non-tender, non-distended, No pulsatile mass, MUSCULOSKELETAL: No deformity  SKIN: Warm and dry NEUROLOGIC:  Alert and oriented x 3 PSYCHIATRIC:  Normal affect   ASSESSMENT:    1. Hypertrophic cardiomyopathy (HCC)   2. Hypertension associated with diabetes (HCC)   3. Hyperlipidemia associated with type 2 diabetes mellitus (HCC)   4. Type 2 diabetes mellitus without complication, without long-term current use of insulin (HCC)   5. Paroxysmal atrial fibrillation (HCC)   6. Long term (current) use of anticoagulants   7. Educated about COVID-19 virus infection    PLAN:    In order of problems listed above:  1. Asymptomatic.  Follow-up in 9 to 12 months. 2. Excellent blood pressure control on current regimen.  No swelling on amlodipine 10 mg/day. 3. Continue Pravachol 40 mg/day. 4. Continue Glucophage.  Low carbohydrate diet. 5. Continue Coumadin therapy to prevent stroke in the setting of paroxysmal A. Fib. 6. Followed in Coumadin clinic.  No bleeding complications. 7. Vaccinated and practicing social distancing.   Clinical follow-up 9 to 12 months.   Medication Adjustments/Labs and  Tests Ordered: Current medicines are reviewed at length with the patient today.  Concerns   regarding medicines are outlined above.  No orders of the defined types were placed in this encounter.  No orders of the defined types were placed in this encounter.   Patient Instructions  Medication Instructions:  Your physician recommends that you continue on your current medications as directed. Please refer to the Current Medication list given to you today.  *If you need a refill on your cardiac medications before your next appointment, please call your pharmacy*   Lab Work: None If you have labs (blood work) drawn today and your tests are completely normal, you will receive your results only by: Marland Kitchen MyChart Message (if you have MyChart) OR . A paper copy in the mail If you have any lab test that is abnormal or we need to change your treatment, we will call you to review the results.   Testing/Procedures: None   Follow-Up: At [facility], you and your health needs are our priority.  As part of our continuing mission to provide you with exceptional heart care, we have created designated Provider Care Teams.  These Care Teams include your primary Cardiologist (physician) and Advanced Practice Providers (APPs -  Physician Assistants and Nurse Practitioners) who all work together to provide you with the care you need, when you need it.  We recommend signing up for the patient portal called "MyChart".  Sign up information is provided on this After Visit Summary.  MyChart is used to connect with patients for Virtual Visits (Telemedicine).  Patients are able to view lab/test results, encounter notes, upcoming appointments, etc.  Non-urgent messages can be sent to your provider as well.   To learn more about what you can do with MyChart, go to [email or web address].    Your next appointment:   9-12 month(s)  The format for your next appointment:   In Person  Provider:   You may see Alan Grooms, MD or one of the following Advanced Practice Providers on your designated Care Team:    Kathyrn Drown, NP    Other Instructions      Signed, Alan Grooms, MD  01/07/2021 5:04 PM    [place]

## 2021-01-07 ENCOUNTER — Ambulatory Visit: Payer: Medicare Other | Admitting: Interventional Cardiology

## 2021-01-07 ENCOUNTER — Encounter: Payer: Self-pay | Admitting: Interventional Cardiology

## 2021-01-07 ENCOUNTER — Other Ambulatory Visit: Payer: Self-pay

## 2021-01-07 VITALS — BP 120/50 | HR 60 | Ht 65.0 in | Wt 166.0 lb

## 2021-01-07 DIAGNOSIS — Z7189 Other specified counseling: Secondary | ICD-10-CM | POA: Diagnosis not present

## 2021-01-07 DIAGNOSIS — E119 Type 2 diabetes mellitus without complications: Secondary | ICD-10-CM

## 2021-01-07 DIAGNOSIS — I480 Paroxysmal atrial fibrillation: Secondary | ICD-10-CM | POA: Diagnosis not present

## 2021-01-07 DIAGNOSIS — I422 Other hypertrophic cardiomyopathy: Secondary | ICD-10-CM

## 2021-01-07 DIAGNOSIS — I152 Hypertension secondary to endocrine disorders: Secondary | ICD-10-CM

## 2021-01-07 DIAGNOSIS — Z7901 Long term (current) use of anticoagulants: Secondary | ICD-10-CM

## 2021-01-07 DIAGNOSIS — E1159 Type 2 diabetes mellitus with other circulatory complications: Secondary | ICD-10-CM | POA: Diagnosis not present

## 2021-01-07 DIAGNOSIS — E1169 Type 2 diabetes mellitus with other specified complication: Secondary | ICD-10-CM | POA: Diagnosis not present

## 2021-01-07 DIAGNOSIS — E785 Hyperlipidemia, unspecified: Secondary | ICD-10-CM | POA: Diagnosis not present

## 2021-01-07 NOTE — Patient Instructions (Signed)
Medication Instructions:  Your physician recommends that you continue on your current medications as directed. Please refer to the Current Medication list given to you today.  *If you need a refill on your cardiac medications before your next appointment, please call your pharmacy*   Lab Work: None If you have labs (blood work) drawn today and your tests are completely normal, you will receive your results only by: Marland Kitchen MyChart Message (if you have MyChart) OR . A paper copy in the mail If you have any lab test that is abnormal or we need to change your treatment, we will call you to review the results.   Testing/Procedures: None   Follow-Up: At [facility], you and your health needs are our priority.  As part of our continuing mission to provide you with exceptional heart care, we have created designated Provider Care Teams.  These Care Teams include your primary Cardiologist (physician) and Advanced Practice Providers (APPs -  Physician Assistants and Nurse Practitioners) who all work together to provide you with the care you need, when you need it.  We recommend signing up for the patient portal called "MyChart".  Sign up information is provided on this After Visit Summary.  MyChart is used to connect with patients for Virtual Visits (Telemedicine).  Patients are able to view lab/test results, encounter notes, upcoming appointments, etc.  Non-urgent messages can be sent to your provider as well.   To learn more about what you can do with MyChart, go to [email or web address].    Your next appointment:   9-12 month(s)  The format for your next appointment:   In Person  Provider:   You may see Sinclair Grooms, MD or one of the following Advanced Practice Providers on your designated Care Team:    Kathyrn Drown, NP    Other Instructions

## 2021-01-14 ENCOUNTER — Ambulatory Visit (INDEPENDENT_AMBULATORY_CARE_PROVIDER_SITE_OTHER): Payer: Medicare Other | Admitting: Family Medicine

## 2021-01-14 ENCOUNTER — Encounter: Payer: Self-pay | Admitting: Family Medicine

## 2021-01-14 ENCOUNTER — Other Ambulatory Visit: Payer: Self-pay

## 2021-01-14 VITALS — BP 143/58 | HR 55 | Ht 65.0 in | Wt 167.0 lb

## 2021-01-14 DIAGNOSIS — D6859 Other primary thrombophilia: Secondary | ICD-10-CM | POA: Diagnosis not present

## 2021-01-14 DIAGNOSIS — E1159 Type 2 diabetes mellitus with other circulatory complications: Secondary | ICD-10-CM | POA: Diagnosis not present

## 2021-01-14 DIAGNOSIS — Z86718 Personal history of other venous thrombosis and embolism: Secondary | ICD-10-CM | POA: Diagnosis not present

## 2021-01-14 DIAGNOSIS — Z7901 Long term (current) use of anticoagulants: Secondary | ICD-10-CM

## 2021-01-14 DIAGNOSIS — E785 Hyperlipidemia, unspecified: Secondary | ICD-10-CM | POA: Diagnosis not present

## 2021-01-14 DIAGNOSIS — E119 Type 2 diabetes mellitus without complications: Secondary | ICD-10-CM

## 2021-01-14 DIAGNOSIS — I480 Paroxysmal atrial fibrillation: Secondary | ICD-10-CM

## 2021-01-14 DIAGNOSIS — I152 Hypertension secondary to endocrine disorders: Secondary | ICD-10-CM | POA: Diagnosis not present

## 2021-01-14 DIAGNOSIS — K219 Gastro-esophageal reflux disease without esophagitis: Secondary | ICD-10-CM | POA: Diagnosis not present

## 2021-01-14 DIAGNOSIS — E1169 Type 2 diabetes mellitus with other specified complication: Secondary | ICD-10-CM

## 2021-01-14 LAB — CMP14+EGFR
ALT: 14 IU/L (ref 0–44)
AST: 16 IU/L (ref 0–40)
Albumin/Globulin Ratio: 1.3 (ref 1.2–2.2)
Albumin: 3.9 g/dL (ref 3.7–4.7)
Alkaline Phosphatase: 65 IU/L (ref 44–121)
BUN/Creatinine Ratio: 16 (ref 10–24)
BUN: 19 mg/dL (ref 8–27)
Bilirubin Total: 0.5 mg/dL (ref 0.0–1.2)
CO2: 24 mmol/L (ref 20–29)
Calcium: 9.1 mg/dL (ref 8.6–10.2)
Chloride: 101 mmol/L (ref 96–106)
Creatinine, Ser: 1.18 mg/dL (ref 0.76–1.27)
GFR calc Af Amer: 68 mL/min/{1.73_m2} (ref >59)
GFR calc non Af Amer: 59 mL/min/{1.73_m2} — ABNORMAL LOW (ref >59)
Globulin, Total: 2.9 g/dL (ref 1.5–4.5)
Glucose: 159 mg/dL — ABNORMAL HIGH (ref 65–99)
Potassium: 3.6 mmol/L (ref 3.5–5.2)
Sodium: 141 mmol/L (ref 134–144)
Total Protein: 6.8 g/dL (ref 6.0–8.5)

## 2021-01-14 LAB — COAGUCHEK XS/INR WAIVED
INR: 3.1 — ABNORMAL HIGH (ref 0.9–1.1)
Prothrombin Time: 37.1 s

## 2021-01-14 LAB — CBC WITH DIFFERENTIAL/PLATELET
Basophils Absolute: 0 10*3/uL (ref 0.0–0.2)
Basos: 0 %
EOS (ABSOLUTE): 0.2 10*3/uL (ref 0.0–0.4)
Eos: 3 %
Hematocrit: 31.3 % — ABNORMAL LOW (ref 37.5–51.0)
Hemoglobin: 10.6 g/dL — ABNORMAL LOW (ref 13.0–17.7)
Immature Grans (Abs): 0 10*3/uL (ref 0.0–0.1)
Immature Granulocytes: 0 %
Lymphocytes Absolute: 1.2 10*3/uL (ref 0.7–3.1)
Lymphs: 22 %
MCH: 30.1 pg (ref 26.6–33.0)
MCHC: 33.9 g/dL (ref 31.5–35.7)
MCV: 89 fL (ref 79–97)
Monocytes Absolute: 0.5 10*3/uL (ref 0.1–0.9)
Monocytes: 9 %
Neutrophils Absolute: 3.5 10*3/uL (ref 1.4–7.0)
Neutrophils: 66 %
Platelets: 234 10*3/uL (ref 150–450)
RBC: 3.52 x10E6/uL — ABNORMAL LOW (ref 4.14–5.80)
RDW: 13.3 % (ref 11.6–15.4)
WBC: 5.4 10*3/uL (ref 3.4–10.8)

## 2021-01-14 LAB — LIPID PANEL
Chol/HDL Ratio: 3.6 ratio (ref 0.0–5.0)
Cholesterol, Total: 89 mg/dL — ABNORMAL LOW (ref 100–199)
HDL: 25 mg/dL — ABNORMAL LOW (ref >39)
LDL Chol Calc (NIH): 27 mg/dL (ref 0–99)
Triglycerides: 238 mg/dL — ABNORMAL HIGH (ref 0–149)
VLDL Cholesterol Cal: 37 mg/dL (ref 5–40)

## 2021-01-14 LAB — BAYER DCA HB A1C WAIVED: HB A1C (BAYER DCA - WAIVED): 7.2 % — ABNORMAL HIGH (ref <7.0)

## 2021-01-14 MED ORDER — WARFARIN SODIUM 5 MG PO TABS: ORAL_TABLET | ORAL | 3 refills | Status: DC

## 2021-01-14 MED ORDER — LISINOPRIL 20 MG PO TABS: 30.0000 mg | ORAL_TABLET | Freq: Every day | ORAL | 3 refills | Status: DC

## 2021-01-14 MED ORDER — AMLODIPINE BESYLATE 10 MG PO TABS: 10.0000 mg | ORAL_TABLET | Freq: Every evening | ORAL | 3 refills | Status: DC

## 2021-01-14 MED ORDER — ONETOUCH ULTRA MINI W/DEVICE KIT: 1.0000 | PACK | Freq: Two times a day (BID) | 1 refills | Status: DC

## 2021-01-14 MED ORDER — OMEPRAZOLE 20 MG PO CPDR
20.0000 mg | DELAYED_RELEASE_CAPSULE | Freq: Every day | ORAL | 3 refills | Status: DC
Start: 2021-01-14 — End: 2021-05-12

## 2021-01-14 MED ORDER — PRAVASTATIN SODIUM 40 MG PO TABS: 40.0000 mg | ORAL_TABLET | Freq: Every evening | ORAL | 3 refills | Status: DC

## 2021-01-14 MED ORDER — METOPROLOL SUCCINATE ER 100 MG PO TB24: 100.0000 mg | ORAL_TABLET | Freq: Every day | ORAL | 3 refills | Status: DC

## 2021-01-14 NOTE — Progress Notes (Signed)
BP (!) 143/58   Pulse (!) 55   Ht '5\' 5"'  (1.651 m)   Wt 167 lb (75.8 kg)   SpO2 99%   BMI 27.79 kg/m    Subjective:   Patient ID: Alan Netters., male    DOB: 1941/08/05, 80 y.o.   MRN: [id number]  HPI: Alan Apolinar. is a 80 y.o. male presenting on 01/14/2021 for Medical Management of Chronic Issues and Atrial Fibrillation   HPI Hypertension Patient is currently on chlorthalidone and amlodipine and lisinopril and metoprolol, and their blood pressure today is 143/58. Patient denies any lightheadedness or dizziness. Patient denies headaches, blurred vision, chest pains, shortness of breath, or weakness. Denies any side effects from medication and is content with current medication.   Hyperlipidemia Patient is coming in for recheck of his hyperlipidemia. The patient is currently taking pravastatin. They deny any issues with myalgias or history of liver damage from it. They deny any focal numbness or weakness or chest pain.   Type 2 diabetes mellitus Patient comes in today for recheck of his diabetes. Patient has been currently taking Metformin. Patient is currently on an ACE inhibitor/ARB. Patient has not seen an ophthalmologist this year. Patient denies any issues with their feet. The symptom started onset as an adult hypertension and hyperlipidemia ARE RELATED TO DM   GERD Patient is currently on omeprazole.  She denies any major symptoms or abdominal pain or belching or burping. She denies any blood in her stool or lightheadedness or dizziness.  Coumadin recheck Target goal: 2.0-3.0 Reason on anticoagulation: Paroxysmal A. fib and history of DVTs Patient denies any bruising or bleeding or chest pain or palpitations    Relevant past medical, surgical, family and social history reviewed and updated as indicated. Interim medical history since our last visit reviewed. Allergies and medications reviewed and updated.  Review of Systems  Constitutional: Negative for chills and fever.   Respiratory: Negative for shortness of breath and wheezing.   Cardiovascular: Negative for chest pain and leg swelling.  Musculoskeletal: Negative for back pain and gait problem.  Skin: Negative for rash.  Neurological: Negative for dizziness, weakness and light-headedness.  All other systems reviewed and are negative.   Per HPI unless specifically indicated above   Allergies as of 01/14/2021      Reactions   Allopurinol Other (See Comments)   Significantly decreased WBC's   Colchicine Other (See Comments)   Significantly decreased WBC's      Medication List       Accurate as of January 14, 2021  9:18 AM. If you have any questions, ask your nurse or doctor.        STOP taking these medications   HYDROcodone-acetaminophen 5-325 MG tablet Commonly known as: NORCO/VICODIN Stopped by: Fransisca Kaufmann Kanijah Groseclose, MD     TAKE these medications   acetaminophen 500 MG tablet Commonly known as: TYLENOL Take 500 mg by mouth every 6 (six) hours as needed.   amLODipine 10 MG tablet Commonly known as: NORVASC TAKE 1 TABLET BY MOUTH ONCE DAILY IN THE EVENING   Centrum Silver 50+Men Tabs Take by mouth daily.   chlorthalidone 25 MG tablet Commonly known as: HYGROTON Take 1 tablet by mouth once daily   enoxaparin 120 MG/0.8ML injection Commonly known as: Lovenox Inject 0.8 mLs (120 mg total) into the skin at bedtime.   finasteride 5 MG tablet Commonly known as: PROSCAR Take 1 tablet (5 mg total) by mouth daily.   fluticasone 50 MCG/ACT nasal spray  Commonly known as: FLONASE Place 1 spray into both nostrils 2 (two) times daily as needed for allergies or rhinitis.   glucose blood test strip Commonly known as: ONE TOUCH ULTRA TEST Use to check BG once daily.  Dx:  Type 2 DM controlled E11.9   Iron 325 (65 Fe) MG Tabs Take 1 tablet by mouth once daily with breakfast   lisinopril 20 MG tablet Commonly known as: ZESTRIL TAKE 1 & 1/2 (ONE & ONE-HALF) TABLETS BY MOUTH ONCE DAILY    metFORMIN 500 MG tablet Commonly known as: GLUCOPHAGE Take by mouth 2 (two) times daily with a meal. What changed: Another medication with the same name was removed. Continue taking this medication, and follow the directions you see here. Changed by: Fransisca Kaufmann Kristen Fromm, MD   metoprolol succinate 100 MG 24 hr tablet Commonly known as: TOPROL-XL TAKE 1 TABLET BY MOUTH ONCE DAILY TAKE  WITH  OR  IMMEDIATELY  FOLLOWING  A  MEAL   omeprazole 20 MG capsule Commonly known as: PRILOSEC Take 1 capsule by mouth once daily   ONE TOUCH ULTRA MINI w/Device Kit 1 each by Does not apply route 2 (two) times daily.   potassium chloride SA 20 MEQ tablet Commonly known as: KLOR-CON Take 1 tablet by mouth once daily   pravastatin 40 MG tablet Commonly known as: PRAVACHOL TAKE 1 TABLET BY MOUTH ONCE DAILY IN THE EVENING   Sutab [phone] MG Tabs Generic drug: Sodium Sulfate-Mag Sulfate-KCl Take by mouth as needed.   warfarin 5 MG tablet Commonly known as: COUMADIN Take as directed by the anticoagulation clinic. If you are unsure how to take this medication, talk to your nurse or doctor. Original instructions: TAKE 1 & 1/2 (ONE & ONE-HALF) TABLETS BY MOUTH ONCE DAILY AT  6  PM        Objective:   BP (!) 143/58   Pulse (!) 55   Ht '5\' 5"'  (1.651 m)   Wt 167 lb (75.8 kg)   SpO2 99%   BMI 27.79 kg/m   Wt Readings from Last 3 Encounters:  01/14/21 167 lb (75.8 kg)  01/07/21 166 lb (75.3 kg)  12/31/20 166 lb (75.3 kg)    Physical Exam Vitals and nursing note reviewed.  Constitutional:      General: He is not in acute distress.    Appearance: He is well-developed and well-nourished. He is not diaphoretic.  Eyes:     General: No scleral icterus.    Extraocular Movements: Extraocular movements intact and EOM normal.     Conjunctiva/sclera: Conjunctivae normal.     Pupils: Pupils are equal, round, and reactive to light.  Neck:     Thyroid: No thyromegaly.  Cardiovascular:      Rate and Rhythm: Normal rate and regular rhythm.     Pulses: Intact distal pulses.     Heart sounds: Normal heart sounds. No murmur heard.   Pulmonary:     Effort: Pulmonary effort is normal. No respiratory distress.     Breath sounds: Normal breath sounds. No wheezing.  Musculoskeletal:        General: No edema. Normal range of motion.     Cervical back: Neck supple.  Lymphadenopathy:     Cervical: No cervical adenopathy.  Skin:    General: Skin is warm and dry.     Findings: No rash.  Neurological:     Mental Status: He is alert and oriented to person, place, and time.     Coordination: Coordination  normal.  Psychiatric:        Mood and Affect: Mood and affect normal.        Behavior: Behavior normal.     Results for orders placed or performed in visit on 12/31/20  CoaguChek XS/INR Waived  Result Value Ref Range   INR 1.3 (H) 0.9 - 1.1   Prothrombin Time 16.1 sec    Assessment & Plan:   Problem List Items Addressed This Visit      Cardiovascular and Mediastinum   Hypertension associated with diabetes ([place])   Relevant Medications   metFORMIN (GLUCOPHAGE) 500 MG tablet   amLODipine (NORVASC) 10 MG tablet   lisinopril (ZESTRIL) 20 MG tablet   metoprolol succinate (TOPROL-XL) 100 MG 24 hr tablet   pravastatin (PRAVACHOL) 40 MG tablet   warfarin (COUMADIN) 5 MG tablet   Other Relevant Orders   CMP14+EGFR   Paroxysmal atrial fibrillation (HCC) - Primary   Relevant Medications   amLODipine (NORVASC) 10 MG tablet   lisinopril (ZESTRIL) 20 MG tablet   metoprolol succinate (TOPROL-XL) 100 MG 24 hr tablet   pravastatin (PRAVACHOL) 40 MG tablet   warfarin (COUMADIN) 5 MG tablet   Other Relevant Orders   CoaguChek XS/INR Waived   CBC with Differential/Platelet   CMP14+EGFR     Digestive   GERD (gastroesophageal reflux disease)   Relevant Medications   omeprazole (PRILOSEC) 20 MG capsule     Endocrine   Hyperlipidemia associated with type 2 diabetes mellitus (HCC)    Relevant Medications   metFORMIN (GLUCOPHAGE) 500 MG tablet   lisinopril (ZESTRIL) 20 MG tablet   pravastatin (PRAVACHOL) 40 MG tablet   Other Relevant Orders   Lipid panel   Type 2 diabetes mellitus without complications (HCC)   Relevant Medications   metFORMIN (GLUCOPHAGE) 500 MG tablet   lisinopril (ZESTRIL) 20 MG tablet   pravastatin (PRAVACHOL) 40 MG tablet   Other Relevant Orders   CMP14+EGFR   Bayer DCA Hb A1c Waived     Hematopoietic and Hemostatic   Primary hypercoagulable state ([place]) [D68.59]   Relevant Medications   warfarin (COUMADIN) 5 MG tablet     Other   Long term (current) use of anticoagulants [Z79.01]   History of DVT (deep vein thrombosis)      Description   current dose to take 1 tablet every day of the week except for Mondays, Wednesdays and Saturdays take 1 1/2 tablets  INR was 3.1 (goal 2-3)   Follow-up 4-6 weeks     Continue current medication, will send for a new meter from the lab 1.    Follow up plan: Return if symptoms worsen or fail to improve, for 4 to 6-week INR recheck.  Counseling provided for all of the vaccine components Orders Placed This Encounter  Procedures  . CoaguChek XS/INR Sims, MD [facility] 01/14/2021, 9:18 AM

## 2021-01-17 ENCOUNTER — Telehealth: Payer: Medicare Other

## 2021-01-24 ENCOUNTER — Other Ambulatory Visit: Payer: Self-pay

## 2021-01-24 ENCOUNTER — Ambulatory Visit: Payer: Medicare Other | Admitting: Licensed Clinical Social Worker

## 2021-01-24 ENCOUNTER — Telehealth: Payer: Self-pay

## 2021-01-24 DIAGNOSIS — K219 Gastro-esophageal reflux disease without esophagitis: Secondary | ICD-10-CM

## 2021-01-24 DIAGNOSIS — I480 Paroxysmal atrial fibrillation: Secondary | ICD-10-CM

## 2021-01-24 DIAGNOSIS — E785 Hyperlipidemia, unspecified: Secondary | ICD-10-CM

## 2021-01-24 DIAGNOSIS — M109 Gout, unspecified: Secondary | ICD-10-CM

## 2021-01-24 DIAGNOSIS — I152 Hypertension secondary to endocrine disorders: Secondary | ICD-10-CM

## 2021-01-24 DIAGNOSIS — Z86718 Personal history of other venous thrombosis and embolism: Secondary | ICD-10-CM

## 2021-01-24 DIAGNOSIS — E119 Type 2 diabetes mellitus without complications: Secondary | ICD-10-CM

## 2021-01-24 DIAGNOSIS — N138 Other obstructive and reflux uropathy: Secondary | ICD-10-CM

## 2021-01-24 DIAGNOSIS — C61 Malignant neoplasm of prostate: Secondary | ICD-10-CM

## 2021-01-24 DIAGNOSIS — E1169 Type 2 diabetes mellitus with other specified complication: Secondary | ICD-10-CM

## 2021-01-24 DIAGNOSIS — E1159 Type 2 diabetes mellitus with other circulatory complications: Secondary | ICD-10-CM

## 2021-01-24 DIAGNOSIS — N403 Nodular prostate with lower urinary tract symptoms: Secondary | ICD-10-CM

## 2021-01-24 MED ORDER — FINASTERIDE 5 MG PO TABS: 5.0000 mg | ORAL_TABLET | Freq: Every day | ORAL | 3 refills | Status: DC

## 2021-01-24 NOTE — Telephone Encounter (Signed)
Had message from after hours nurse line saying pt called and said he was out of his Finasteride. In chart said was refilled on 2/14. Called pt and made him aware.

## 2021-01-24 NOTE — Patient Instructions (Addendum)
Licensed Clinical Social Worker Visit Information  Materials Provided: No  01/24/2021  Name: Alan [name].            MRN: [id number]       DOB: Aug 22, 1941  Alan [name]. is a 80 y.o. year old male who is a primary care patient of Dettinger, Fransisca Kaufmann, MD. The CCM team was consulted for assistance with [employer] .   Review of patient status, including review of consultants reports, other relevant assessments, and collaboration with appropriate care team members and the patient's provider was performed as part of comprehensive patient evaluation and provision of chronic care management services.    SDOH (Social Determinants of Health) assessments performed: No; risk for depression; risk for tobacco use; risk for stress; risk for physical inactivity  LCSW called home phone number for client several times today; but LCSW was not able to speak via phone today with client or spouse of client. LCSW did leave phone message requesting that client or spouse of client please call LCSW at [phone]  Follow Up Plan:LCSW to call client or Alan [name], spouse of client, in next 4 weeks to discuss health needs of client and to discuss client completion of daily activities  LCSW was not able to speak via phone today with client or spouse of client; thus the client or his spouse were not able to verbalize understanding of instructions provided today and were not able to accept or decline a print copy of patient instruction materials.   Alan [name].Pranshu Lyster MSW, LCSW Licensed Clinical Social Worker [facility] Care Management [phone]

## 2021-01-24 NOTE — Chronic Care Management (AMB) (Signed)
Chronic Care Management    Clinical Social Work Follow Up Note  01/24/2021 Name: Alan [name]. MRN: [id number] DOB: 08-Jun-1941  Alan [name]. is a 80 y.o. year old male who is a primary care patient of Dettinger, Fransisca Kaufmann, MD. The CCM team was consulted for assistance with [employer] .   Review of patient status, including review of consultants reports, other relevant assessments, and collaboration with appropriate care team members and the patient's provider was performed as part of comprehensive patient evaluation and provision of chronic care management services.    SDOH (Social Determinants of Health) assessments performed: No; risk for depression; risk for tobacco use; risk for stress; risk for physical inactivity  Flowsheet Row Chronic Care Management from 06/22/2020 in [place]  PHQ-9 Total Score 2     GAD 7 : Generalized Anxiety Score 06/22/2020  Nervous, Anxious, on Edge 0  Control/stop worrying 0  Worry too much - different things 0  Trouble relaxing 0  Restless 0  Easily annoyed or irritable 0  Afraid - awful might happen 0  Total GAD 7 Score 0  Anxiety Difficulty Not difficult at all    Outpatient Encounter Medications as of 01/24/2021  Medication Sig  . acetaminophen (TYLENOL) 500 MG tablet Take 500 mg by mouth every 6 (six) hours as needed.  Marland Kitchen amLODipine (NORVASC) 10 MG tablet Take 1 tablet (10 mg total) by mouth every evening.  . Blood Glucose Monitoring Suppl (ONE TOUCH ULTRA MINI) w/Device KIT 1 each by Does not apply route 2 (two) times daily.  . chlorthalidone (HYGROTON) 25 MG tablet Take 1 tablet by mouth once daily  . enoxaparin (LOVENOX) 120 MG/0.8ML injection Inject 0.8 mLs (120 mg total) into the skin at bedtime.  . Ferrous Sulfate (IRON) 325 (65 Fe) MG TABS Take 1 tablet by mouth once daily with breakfast  . finasteride (PROSCAR) 5 MG tablet Take 1 tablet (5 mg total) by mouth daily.  . fluticasone (FLONASE) 50 MCG/ACT  nasal spray Place 1 spray into both nostrils 2 (two) times daily as needed for allergies or rhinitis.  Marland Kitchen glucose blood (ONE TOUCH ULTRA TEST) test strip Use to check BG once daily.  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 lisinopril (ZESTRIL) 20 MG tablet Take 1.5 tablets (30 mg total) by mouth daily.  . metFORMIN (GLUCOPHAGE) 500 MG tablet Take by mouth 2 (two) times daily with a meal.  . metoprolol succinate (TOPROL-XL) 100 MG 24 hr tablet Take 1 tablet (100 mg total) by mouth daily. Take with or immediately following a meal.  . Multiple Vitamins-Minerals (CENTRUM SILVER 50+MEN) TABS Take by mouth daily.  Marland Kitchen omeprazole (PRILOSEC) 20 MG capsule Take 1 capsule (20 mg total) by mouth daily.  . potassium chloride SA (KLOR-CON) 20 MEQ tablet Take 1 tablet by mouth once daily  . pravastatin (PRAVACHOL) 40 MG tablet Take 1 tablet (40 mg total) by mouth every evening.  . Sodium Sulfate-Mag Sulfate-KCl (SUTAB) [phone] MG TABS Take by mouth as needed.   . warfarin (COUMADIN) 5 MG tablet TAKE 1 & 1/2 (ONE & ONE-HALF) TABLETS BY MOUTH ONCE DAILY AT  6  PM  . [DISCONTINUED] finasteride (PROSCAR) 5 MG tablet Take 1 tablet (5 mg total) by mouth daily.   No facility-administered encounter medications on file as of 01/24/2021.    LCSW called home phone number for client several times today; but LCSW was not able to speak via phone today with client or spouse  of client. LCSW did leave phone message requesting that client or spouse of client please call LCSW at [phone]  Follow Up Plan:LCSW to call client or Javanni Maring, spouse of client, in next 4 weeks to discuss health needs of client and to discuss client completion of daily activities  Norva Riffle.Ciaira Natividad MSW, LCSW Licensed Clinical Social Worker [facility] Care Management [phone]

## 2021-02-06 ENCOUNTER — Other Ambulatory Visit: Payer: Self-pay | Admitting: Family Medicine

## 2021-02-06 DIAGNOSIS — E1169 Type 2 diabetes mellitus with other specified complication: Secondary | ICD-10-CM

## 2021-02-14 ENCOUNTER — Other Ambulatory Visit: Payer: Self-pay | Admitting: Family Medicine

## 2021-02-14 DIAGNOSIS — I152 Hypertension secondary to endocrine disorders: Secondary | ICD-10-CM

## 2021-02-14 DIAGNOSIS — E1159 Type 2 diabetes mellitus with other circulatory complications: Secondary | ICD-10-CM

## 2021-02-18 ENCOUNTER — Other Ambulatory Visit: Payer: Self-pay | Admitting: Family Medicine

## 2021-02-18 DIAGNOSIS — E1159 Type 2 diabetes mellitus with other circulatory complications: Secondary | ICD-10-CM

## 2021-02-23 ENCOUNTER — Ambulatory Visit (INDEPENDENT_AMBULATORY_CARE_PROVIDER_SITE_OTHER): Payer: Medicare Other | Admitting: Family Medicine

## 2021-02-23 ENCOUNTER — Encounter: Payer: Self-pay | Admitting: Family Medicine

## 2021-02-23 ENCOUNTER — Other Ambulatory Visit: Payer: Self-pay

## 2021-02-23 VITALS — BP 140/59 | HR 58 | Ht 65.0 in | Wt 166.0 lb

## 2021-02-23 DIAGNOSIS — D6859 Other primary thrombophilia: Secondary | ICD-10-CM | POA: Diagnosis not present

## 2021-02-23 DIAGNOSIS — I480 Paroxysmal atrial fibrillation: Secondary | ICD-10-CM

## 2021-02-23 DIAGNOSIS — Z7901 Long term (current) use of anticoagulants: Secondary | ICD-10-CM

## 2021-02-23 DIAGNOSIS — Z86718 Personal history of other venous thrombosis and embolism: Secondary | ICD-10-CM

## 2021-02-23 LAB — COAGUCHEK XS/INR WAIVED
INR: 3.5 — ABNORMAL HIGH (ref 0.9–1.1)
Prothrombin Time: 41.8 s

## 2021-02-23 NOTE — Progress Notes (Signed)
BP (!) 140/59   Pulse (!) 58   Ht _0  (1.651 m)   Wt 166 lb (75.3 kg)   SpO2 100%   BMI 27.62 kg/m    Subjective:   Patient ID: Alan [name]., male    DOB: 02-01-1941, 80 y.o.   MRN: [id number]  HPI: Alan [name]. is a 80 y.o. male presenting on 02/23/2021 for Medical Management of Chronic Issues and Atrial Fibrillation   HPI Coumadin recheck Target goal: 2103.0 Reason on anticoagulation: History of DVTs Patient denies any bruising or bleeding or chest pain or palpitations   Relevant past medical, surgical, family and social history reviewed and updated as indicated. Interim medical history since our last visit reviewed. Allergies and medications reviewed and updated.  Review of Systems  Constitutional: Negative for chills and fever.  Respiratory: Negative for shortness of breath and wheezing.   Cardiovascular: Negative for chest pain and leg swelling.  Gastrointestinal: Negative for blood in stool.  Genitourinary: Negative for hematuria.  Musculoskeletal: Negative for back pain and gait problem.  Skin: Negative for rash.  All other systems reviewed and are negative.   Per HPI unless specifically indicated above   Allergies as of 02/23/2021      Reactions   Allopurinol Other (See Comments)   Significantly decreased WBC's   Colchicine Other (See Comments)   Significantly decreased WBC's      Medication List       Accurate as of February 23, 2021 11:43 AM. If you have any questions, ask your nurse or doctor.        acetaminophen 500 MG tablet Commonly known as: TYLENOL Take 500 mg by mouth every 6 (six) hours as needed.   amLODipine 10 MG tablet Commonly known as: NORVASC Take 1 tablet (10 mg total) by mouth every evening.   Centrum Silver 50+Men Tabs Take by mouth daily.   chlorthalidone 25 MG tablet Commonly known as: HYGROTON Take 1 tablet by mouth once daily   enoxaparin 120 MG/0.8ML injection Commonly known as: Lovenox Inject 0.8 mLs (120 mg  total) into the skin at bedtime.   finasteride 5 MG tablet Commonly known as: PROSCAR Take 1 tablet (5 mg total) by mouth daily.   fluticasone 50 MCG/ACT nasal spray Commonly known as: FLONASE Place 1 spray into both nostrils 2 (two) times daily as needed for allergies or rhinitis.   glucose blood test strip Commonly known as: ONE TOUCH ULTRA TEST Use to check BG once daily.  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   metFORMIN 500 MG tablet Commonly known as: GLUCOPHAGE Take by mouth 2 (two) times daily with a meal.   metoprolol succinate 100 MG 24 hr tablet Commonly known as: TOPROL-XL Take 1 tablet (100 mg total) by mouth daily. Take with or immediately following a meal.   omeprazole 20 MG capsule Commonly known as: PRILOSEC Take 1 capsule (20 mg total) by mouth daily.   ONE TOUCH ULTRA MINI w/Device Kit 1 each by Does not apply route 2 (two) times daily.   potassium chloride SA 20 MEQ tablet Commonly known as: KLOR-CON Take 1 tablet by mouth once daily   pravastatin 40 MG tablet Commonly known as: PRAVACHOL TAKE 1 TABLET BY MOUTH ONCE DAILY IN THE EVENING  Sutab [phone] MG Tabs Generic drug: Sodium Sulfate-Mag Sulfate-KCl Take by mouth as needed.   warfarin 5 MG tablet Commonly known as: COUMADIN Take as directed by the anticoagulation clinic. If you are unsure how to take this medication, talk to your nurse or doctor. Original instructions: TAKE 1 & 1/2 (ONE & ONE-HALF) TABLETS BY MOUTH ONCE DAILY AT  6  PM        Objective:   BP (!) 140/59   Pulse (!) 58   Ht _0  (1.651 m)   Wt 166 lb (75.3 kg)   SpO2 100%   BMI 27.62 kg/m   Wt Readings from Last 3 Encounters:  02/23/21 166 lb (75.3 kg)  01/14/21 167 lb (75.8 kg)  01/07/21 166 lb (75.3 kg)    Physical Exam Vitals and  nursing note reviewed.  Constitutional:      General: He is not in acute distress.    Appearance: He is well-developed. He is not diaphoretic.  Eyes:     General: No scleral icterus.    Conjunctiva/sclera: Conjunctivae normal.  Neck:     Thyroid: No thyromegaly.  Skin:    General: Skin is warm and dry.     Findings: No rash.  Neurological:     Mental Status: He is alert and oriented to person, place, and time.     Coordination: Coordination normal.  Psychiatric:        Behavior: Behavior normal.     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  Counseling provided for all of the vaccine components Orders Placed This Encounter  Procedures  . CoaguChek XS/INR Lamar, MD [facility] 02/23/2021, 11:43 AM

## 2021-02-23 NOTE — Progress Notes (Signed)
Subjective:  1. Nodular prostate with urinary obstruction   2. Prostate cancer ([place])   3. Nocturia   4. 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  He is voiding well with some dysuria and he continues to have intermittent mild gross hematuria.  He remains on warfarin and his INR is 3.5 on 02/23/21.  He just had the dose reduced yesterday.   His UA has 11-30 RBC's.  His IPSS is 16 with nocturia x 3 and some intermittency.     On initial biopsy, he had only 5% in one core and elected active survelliance. He had a repeat biopsy on 12/24/13 that showed a single core with <5% Gleason 6 in the right mid lateral prostate. His last biopsy was on 06/28/18 and only showed 2 cores with atypia. He is on finasteride and has had  a slowly rising PSA that was up to 6.4 prior to his most recent biopsy on 06/25/20.  The nadir on finasteride was 1.8.    The repeat biopsy demonstrated 6 positive cores with 2 cores with 7(4+3) on the right and 4 cores with low volume Gleason 6 disease.    His prostate volume is 49m. IPSS is 9.  A bone scan on 07/06/20 was negative.    The CT on 07/29/20 was negative for mets.   His PSA was 6.2 prior to therapy starting finasteride.      IPSS    Row Name 02/24/21 1300         International Prostate Symptom Score   How often have you had the sensation of not emptying your bladder? About half the time     How often have you had to urinate less than every two hours? Less than half the time     How often have you found you stopped and started again several times when you urinated? About half the time     How often have you found it difficult to postpone urination? Less than half the time     How often have you had a weak urinary stream? Less than  half the time     How often have you had to strain to start urination? Less than 1 in 5 times     How many times did you typically get up at night to urinate? 3 Times     Total IPSS Score 16           Quality of Life due to urinary symptoms   If you were to spend the rest of your life with your urinary condition just the way it is now how would you feel about that? Mostly Satisfied             ROS:  ROS:  A complete review of systems was performed.  All systems are negative except for pertinent findings as noted.   ROS  Allergies  Allergen Reactions  . Allopurinol Other (See Comments)    Significantly decreased WBC's  . Colchicine Other (See Comments)    Significantly decreased WBC's    Outpatient Encounter Medications as of 02/24/2021  Medication Sig  . acetaminophen (TYLENOL) 500 MG tablet Take 500 mg by mouth every 6 (six) hours as needed.  .Marland KitchenamLODipine (NORVASC) 10 MG  tablet Take 1 tablet (10 mg total) by mouth every evening.  . Blood Glucose Monitoring Suppl (ONE TOUCH ULTRA MINI) w/Device KIT 1 each by Does not apply route 2 (two) times daily.  . chlorthalidone (HYGROTON) 25 MG tablet Take 1 tablet by mouth once daily  . Ferrous Sulfate (IRON) 325 (65 Fe) MG TABS Take 1 tablet by mouth once daily with breakfast  . finasteride (PROSCAR) 5 MG tablet Take 1 tablet (5 mg total) by mouth daily.  . fluticasone (FLONASE) 50 MCG/ACT nasal spray Place 1 spray into both nostrils 2 (two) times daily as needed for allergies or rhinitis.  Marland Kitchen glucose blood (ONE TOUCH ULTRA TEST) test strip Use to check BG once daily.  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 lisinopril (ZESTRIL) 20 MG tablet Take 1.5 tablets (30 mg total) by mouth daily.  . metFORMIN (GLUCOPHAGE) 500 MG tablet Take by mouth 2 (two) times daily with a meal.  . metoprolol succinate (TOPROL-XL) 100 MG 24 hr tablet Take 1 tablet (100 mg total) by mouth daily. Take with or immediately following a meal.  . Multiple  Vitamins-Minerals (CENTRUM SILVER 50+MEN) TABS Take by mouth daily.  Marland Kitchen omeprazole (PRILOSEC) 20 MG capsule Take 1 capsule (20 mg total) by mouth daily.  . potassium chloride SA (KLOR-CON) 20 MEQ tablet Take 1 tablet by mouth once daily  . pravastatin (PRAVACHOL) 40 MG tablet TAKE 1 TABLET BY MOUTH ONCE DAILY IN THE EVENING  . Sodium Sulfate-Mag Sulfate-KCl (SUTAB) [phone] MG TABS Take by mouth as needed.   . warfarin (COUMADIN) 5 MG tablet TAKE 1 & 1/2 (ONE & ONE-HALF) TABLETS BY MOUTH ONCE DAILY AT  6  PM  . [DISCONTINUED] enoxaparin (LOVENOX) 120 MG/0.8ML injection Inject 0.8 mLs (120 mg total) into the skin at bedtime. (Patient not taking: Reported on 02/24/2021)   No facility-administered encounter medications on file as of 02/24/2021.    Past Medical History:  Diagnosis Date  . Anticoagulated on Coumadin    managed by pcp  . Arthritis   . BPH with urinary obstruction   . Diverticulosis of colon   . ED (erectile dysfunction)   . 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 GERD (gastroesophageal reflux disease)   . Heart murmur   . History of acute pancreatitis    2016   necrotizing pancreatitis  . History of adenomatous polyp of colon   . History of DVT of lower extremity   . History of GI bleed 08/2019   upper gi bleed due to duodenal ulcer  . History of gout yrs ago  . Hypertension    followed by pcp  . Hypertrophic cardiomyopathy ([place])    followed by cardiology  . Internal hemorrhoids   . Mixed hyperlipidemia   . Normocytic anemia   . PAF (paroxysmal atrial fibrillation) ([place]) 04/2016   cardiologist-- dr h. Tamala Julian--  event monitor 07-04-2017 epic, NSR/ PAflutter with occasional RVR/  PACs/ PVCs ;  echo 05-16-2017 epic,  moderate LVH with severe asymptomic septal hypertrophy, G1DD, EF 60-65%,  mild AR, mild LAE  . Pancreatic  pseudocyst   . Peyronie's disease   . Prostate cancer [facility]) urologist-- dr Kayhan Boardley/ oncologist-- dr Tammi Klippel   first dx 08/ 2009 Gleason 3+3 active survelliance;  until bx 07/ 2021  Stage T2b, Gleason 4+3  . Right inguinal hernia   .  Type 2 diabetes mellitus ([place])    followed by pcp---  (11-03-2020 per pt currently not checking blood sugar due to glucose monitor broken)  . Wears glasses   . Wears partial dentures    upper and lower    Past Surgical History:  Procedure Laterality Date  . CHOLECYSTECTOMY N/A 11/26/2015   Procedure: LAPAROSCOPIC CHOLECYSTECTOMY;  Surgeon: Aviva Signs, MD;  Location: [facility];  Service: General;  Laterality: N/A;  . COLONOSCOPY  last one 09-06-2020  dr stark  . CYSTOSCOPY N/A 11/09/2020   Procedure: CYSTOSCOPY FLEXIBLE;  Surgeon: Irine Seal, MD;  [facility];  Service: Urology;  Laterality: N/A;  . ESOPHAGOGASTRODUODENOSCOPY  10-06-2019  _0   . FLEXIBLE BRONCHOSCOPY Bilateral 04/14/2016   Procedure: FLEXIBLE BRONCHOSCOPY;  Surgeon: Sinda Du, MD;  Location: [facility];  Service: Cardiopulmonary;  Laterality: Bilateral;  . HEMORRHOID SURGERY  yrs ago  . RADIOACTIVE SEED IMPLANT N/A 11/09/2020   Procedure: RADIOACTIVE SEED IMPLANT/BRACHYTHERAPY IMPLANT;  Surgeon: Irine Seal, MD;  Location[facility];  Service: Urology;  Laterality: N/A;  . SPACE OAR INSTILLATION N/A 11/09/2020   Procedure: SPACE OAR INSTILLATION;  Surgeon: Irine Seal, MD;  Location: [facility];  Service: Urology;  Laterality: N/A;  . UMBILICAL HERNIA REPAIR N/A 11/26/2015   Procedure: UMBILICAL HERNIORRHAPHY;  Surgeon: Aviva Signs, MD;  Location: [facility];  Service: General;  Laterality: N/A;    Social History   Socioeconomic History  . Marital status: Married    Spouse name: Arville Go  . Number of children: 4  . Years of education: Not on file  . Highest education level: 10th grade  Occupational History  . Occupation:  Retired     Fish farm manager: [employer]  Tobacco Use  . Smoking status: Former Smoker    Years: 5.00    Types: Cigarettes    Quit date: 12/11/1986    Years since quitting: 34.2  . Smokeless tobacco: Never Used  Vaping Use  . Vaping Use: Never used  Substance and Sexual Activity  . Alcohol use: No  . Drug use: Never  . Sexual activity: Yes  Other Topics Concern  . Not on file  Social History Narrative  . Not on file   Social Determinants of Health   Financial Resource Strain: Not on file  Food Insecurity: Not on file  Transportation Needs: Not on file  Physical Activity: Not on file  Stress: Not on file  Social Connections: Not on file  Intimate Partner Violence: Not on file    Family History  Problem Relation Age of Onset  . Stroke Mother   . Hypertension Mother   . Alzheimer's disease Mother   . Hypertension Father   . Brain cancer Sister   . Breast cancer Sister   . Hypertension Sister   . Deep vein thrombosis Brother   . Hypertension Brother   . Colon cancer Neg Hx   . Pancreatic disease Neg Hx   . Esophageal cancer Neg Hx   . Rectal cancer Neg Hx   . Stomach cancer Neg Hx        Objective: There were no vitals filed for this visit.   Physical Exam Lab Results  Component Value Date   PSA1 6.5 (H) 09/10/2019    Lab Results:  No results found for this or any previous visit (from the past 24 hour(s)).  BMET No results for input(s): NA, K, CL, CO2, GLUCOSE, BUN, CREATININE, CALCIUM in the last 72 hours. PSA  PSA  Date Value Ref Range Status  05/20/2020 6.4 (H) < OR = 4.0 ng/mL Final    Comment:    The total PSA value from this assay system is  standardized against the WHO standard. The test  result will be approximately 20% lower when compared  to the equimolar-standardized total PSA (Beckman  Coulter). Comparison of serial PSA results should be  interpreted with this fact in mind. . This test was performed using the Siemens  chemiluminescent method.  Values obtained from  different assay methods cannot be used interchangeably. PSA levels, regardless of value, should not be interpreted as absolute evidence of the presence or absence of disease.   11/12/2014 2.0 0.0 - 4.0 ng/mL Final    Comment:    Roche ECLIA methodology. According to the American Urological Association, Serum PSA should decrease and remain at undetectable levels after radical prostatectomy. The AUA defines biochemical recurrence as an initial PSA value 0.2 ng/mL or greater followed by a subsequent confirmatory PSA value 0.2 ng/mL or greater. Values obtained with different assay methods or kits cannot be used interchangeably. Results cannot be interpreted as absolute evidence of the presence or absence of malignant disease.    No results found for: TESTOSTERONE  UA has 11-30 RBC's.   Studies/Results: No results found.   Assessment & Plan: Prostate cancer. He is doing well s/p seeds for T2b N0 M0  Gleason 7(4+3) disease with a PSA that was rising on finasteride and right prostate induration.  I will get a PSA today and prior to f/u in 3 months.   Nodular prostate with obstruction and nocturia.  He will continue the finasteride.    Hematuria.  This persists but it is intermittent and his warfarin dose was reduced this week for an INR of 3.5.  I will just have him return in 3 months for a recheck. .   No orders of the defined types were placed in this encounter.    Orders Placed This Encounter  Procedures  . Urinalysis, Routine w reflex microscopic  . PSA  . PSA    Standing Status:   Future    Standing Expiration Date:   06/26/2021      Return in about 3 months (around 05/27/2021) for with psa.   CC: Dettinger, Fransisca Kaufmann, MD      Irine Seal 02/24/2021

## 2021-02-24 ENCOUNTER — Ambulatory Visit (INDEPENDENT_AMBULATORY_CARE_PROVIDER_SITE_OTHER): Payer: Medicare Other | Admitting: Urology

## 2021-02-24 ENCOUNTER — Telehealth: Payer: Medicare Other

## 2021-02-24 DIAGNOSIS — N138 Other obstructive and reflux uropathy: Secondary | ICD-10-CM | POA: Diagnosis not present

## 2021-02-24 DIAGNOSIS — N403 Nodular prostate with lower urinary tract symptoms: Secondary | ICD-10-CM | POA: Diagnosis not present

## 2021-02-24 DIAGNOSIS — R3129 Other microscopic hematuria: Secondary | ICD-10-CM | POA: Diagnosis not present

## 2021-02-24 DIAGNOSIS — C61 Malignant neoplasm of prostate: Secondary | ICD-10-CM | POA: Diagnosis not present

## 2021-02-24 DIAGNOSIS — R351 Nocturia: Secondary | ICD-10-CM

## 2021-02-24 LAB — MICROSCOPIC EXAMINATION
Bacteria, UA: NONE SEEN
Epithelial Cells (non renal): NONE SEEN /hpf (ref 0–10)
Renal Epithel, UA: NONE SEEN /hpf

## 2021-02-24 LAB — URINALYSIS, ROUTINE W REFLEX MICROSCOPIC
Bilirubin, UA: NEGATIVE
Ketones, UA: NEGATIVE
Leukocytes,UA: NEGATIVE
Nitrite, UA: NEGATIVE
Protein,UA: NEGATIVE
Specific Gravity, UA: 1.02 (ref 1.005–1.030)
Urobilinogen, Ur: 1 mg/dL (ref 0.2–1.0)
pH, UA: 7 (ref 5.0–7.5)

## 2021-02-24 NOTE — Progress Notes (Signed)
Urological Symptom Review  Patient is experiencing the following symptoms: Frequent urination Hard to postpone urination Burning/pain with urination   Review of Systems  Gastrointestinal (upper)  : Negative for upper GI symptoms  Gastrointestinal (lower) : Negative for lower GI symptoms  Constitutional : Negative for symptoms  Skin: Negative for skin symptoms  Eyes: Negative for eye symptoms  Ear/Nose/Throat : Negative for Ear/Nose/Throat symptoms  Hematologic/Lymphatic: Negative for Hematologic/Lymphatic symptoms  Cardiovascular : Negative for cardiovascular symptoms  Respiratory : Negative for respiratory symptoms  Endocrine: Negative for endocrine symptoms  Musculoskeletal: Negative for musculoskeletal symptoms  Neurological: Negative for neurological symptoms  Psychologic: Negative for psychiatric symptoms

## 2021-02-25 ENCOUNTER — Telehealth: Payer: Self-pay

## 2021-02-25 LAB — PSA: Prostate Specific Ag, Serum: 8.1 ng/mL — ABNORMAL HIGH (ref 0.0–4.0)

## 2021-02-25 NOTE — Telephone Encounter (Signed)
His PSA is up some but that sometimes happens in the first few months after a seed implant.  We just need to see what the PSA in June when he returns.    Called patient to give results per Dr. Jeffie Pollock  No answer. Left message to return call to office.

## 2021-03-01 NOTE — Telephone Encounter (Signed)
Unable to reach by phone. Letter mailed.

## 2021-03-01 NOTE — Progress Notes (Signed)
Results mailed 

## 2021-03-07 ENCOUNTER — Other Ambulatory Visit: Payer: Self-pay | Admitting: Family Medicine

## 2021-03-07 DIAGNOSIS — E1169 Type 2 diabetes mellitus with other specified complication: Secondary | ICD-10-CM

## 2021-03-21 ENCOUNTER — Other Ambulatory Visit: Payer: Self-pay | Admitting: Family Medicine

## 2021-03-31 ENCOUNTER — Telehealth: Payer: Medicare Other

## 2021-04-05 ENCOUNTER — Encounter: Payer: Self-pay | Admitting: Family Medicine

## 2021-04-05 ENCOUNTER — Ambulatory Visit (INDEPENDENT_AMBULATORY_CARE_PROVIDER_SITE_OTHER): Payer: Medicare Other | Admitting: Family Medicine

## 2021-04-05 ENCOUNTER — Other Ambulatory Visit: Payer: Self-pay

## 2021-04-05 VITALS — BP 149/61 | HR 48 | Ht 65.0 in | Wt 169.0 lb

## 2021-04-05 DIAGNOSIS — I480 Paroxysmal atrial fibrillation: Secondary | ICD-10-CM

## 2021-04-05 DIAGNOSIS — D6859 Other primary thrombophilia: Secondary | ICD-10-CM | POA: Diagnosis not present

## 2021-04-05 DIAGNOSIS — Z86718 Personal history of other venous thrombosis and embolism: Secondary | ICD-10-CM | POA: Diagnosis not present

## 2021-04-05 DIAGNOSIS — Z7901 Long term (current) use of anticoagulants: Secondary | ICD-10-CM | POA: Diagnosis not present

## 2021-04-05 LAB — COAGUCHEK XS/INR WAIVED
INR: 2.4 — ABNORMAL HIGH (ref 0.9–1.1)
Prothrombin Time: 28.7 s

## 2021-04-05 NOTE — Progress Notes (Signed)
BP (!) 149/61   Pulse (!) 48   Ht '5\' 5"'  (1.651 m)   Wt 169 lb (76.7 kg)   SpO2 99%   BMI 28.12 kg/m    Subjective:   Patient ID: Alan Netters., male    DOB: 24-Jan-1941, 80 y.o.   MRN: [id number]  HPI: Alan [name]. is a 80 y.o. male presenting on 04/05/2021 for Medical Management of Chronic Issues and Atrial Fibrillation   HPI Coumadin recheck Target goal: 2.0-3.0 Reason on anticoagulation: A. fib with history of DVTs Patient denies any bruising or bleeding or chest pain or palpitations   Relevant past medical, surgical, family and social history reviewed and updated as indicated. Interim medical history since our last visit reviewed. Allergies and medications reviewed and updated.  Review of Systems  Constitutional: Negative for chills and fever.  Respiratory: Negative for shortness of breath and wheezing.   Cardiovascular: Negative for chest pain and leg swelling.  Gastrointestinal: Negative for anal bleeding and blood in stool.  Genitourinary: Negative for hematuria.  Musculoskeletal: Negative for back pain and gait problem.  Skin: Negative for rash.  All other systems reviewed and are negative.   Per HPI unless specifically indicated above   Allergies as of 04/05/2021      Reactions   Allopurinol Other (See Comments)   Significantly decreased WBC's   Colchicine Other (See Comments)   Significantly decreased WBC's      Medication List       Accurate as of April 05, 2021 11:41 AM. If you have any questions, ask your nurse or doctor.        acetaminophen 500 MG tablet Commonly known as: TYLENOL Take 500 mg by mouth every 6 (six) hours as needed.   amLODipine 10 MG tablet Commonly known as: NORVASC Take 1 tablet (10 mg total) by mouth every evening.   Centrum Silver 50+Men Tabs Take by mouth daily.   chlorthalidone 25 MG tablet Commonly known as: HYGROTON Take 1 tablet by mouth once daily   finasteride 5 MG tablet Commonly known as:  PROSCAR Take 1 tablet (5 mg total) by mouth daily.   fluticasone 50 MCG/ACT nasal spray Commonly known as: FLONASE Place 1 spray into both nostrils 2 (two) times daily as needed for allergies or rhinitis.   glucose blood test strip Commonly known as: ONE TOUCH ULTRA TEST Use to check BG once daily.  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   metFORMIN 500 MG tablet Commonly known as: GLUCOPHAGE Take by mouth 2 (two) times daily with a meal.   metoprolol succinate 100 MG 24 hr tablet Commonly known as: TOPROL-XL Take 1 tablet (100 mg total) by mouth daily. Take with or immediately following a meal.   omeprazole 20 MG capsule Commonly known as: PRILOSEC Take 1 capsule (20 mg total) by mouth daily.   ONE TOUCH ULTRA MINI w/Device Kit 1 each by Does not apply route 2 (two) times daily.   potassium chloride SA 20 MEQ tablet Commonly known as: KLOR-CON TAKE 1  BY MOUTH ONCE DAILY   pravastatin 40 MG tablet Commonly known as: PRAVACHOL TAKE 1 TABLET BY MOUTH ONCE DAILY IN THE EVENING   Sutab [phone] MG Tabs Generic drug: Sodium Sulfate-Mag Sulfate-KCl Take by mouth as needed.  warfarin 5 MG tablet Commonly known as: COUMADIN Take as directed by the anticoagulation clinic. If you are unsure how to take this medication, talk to your nurse or doctor. Original instructions: TAKE 1 & 1/2 (ONE & ONE-HALF) TABLETS BY MOUTH ONCE DAILY AT  6  PM        Objective:   BP (!) 149/61   Pulse (!) 48   Ht '5\' 5"'  (1.651 m)   Wt 169 lb (76.7 kg)   SpO2 99%   BMI 28.12 kg/m   Wt Readings from Last 3 Encounters:  04/05/21 169 lb (76.7 kg)  02/23/21 166 lb (75.3 kg)  01/14/21 167 lb (75.8 kg)    Physical Exam Vitals and nursing note reviewed.  Constitutional:      General: He is not in acute distress.     Appearance: He is well-developed. He is not diaphoretic.  Eyes:     General: No scleral icterus.    Conjunctiva/sclera: Conjunctivae normal.  Neck:     Thyroid: No thyromegaly.  Cardiovascular:     Rate and Rhythm: Normal rate. Rhythm irregular.     Heart sounds: Normal heart sounds. No murmur heard.   Pulmonary:     Effort: Pulmonary effort is normal. No respiratory distress.     Breath sounds: Normal breath sounds. No wheezing.  Musculoskeletal:        General: Normal range of motion.  Skin:    General: Skin is warm and dry.     Findings: No rash.  Neurological:     Mental Status: He is alert and oriented to person, place, and time.     Coordination: Coordination normal.  Psychiatric:        Behavior: Behavior normal.       Assessment & Plan:   Problem List Items Addressed This Visit      Cardiovascular and Mediastinum   Paroxysmal atrial fibrillation ([place]) - Primary   Relevant Orders   CoaguChek XS/INR Waived (Completed)     Hematopoietic and Hemostatic   Primary hypercoagulable state ([place]) [D68.59]     Other   Long term (current) use of anticoagulants [Z79.01]   History of DVT (deep vein thrombosis)      Description   Continue current dose to take 1 tablet every day of the week except for Mondays, and Saturdays take 1 1/2 tablets  INR was 2.4 (goal 2-3)   Follow-up 4-6 weeks     Follow up plan: Return if symptoms worsen or fail to improve, for 4 to 6-week INR and diabetes recheck.  Counseling provided for all of the vaccine components Orders Placed This Encounter  Procedures  . CoaguChek XS/INR [place], MD [facility] 04/05/2021, 11:41 AM

## 2021-04-18 ENCOUNTER — Other Ambulatory Visit: Payer: Self-pay | Admitting: Family Medicine

## 2021-04-18 DIAGNOSIS — D6859 Other primary thrombophilia: Secondary | ICD-10-CM

## 2021-05-06 ENCOUNTER — Ambulatory Visit (INDEPENDENT_AMBULATORY_CARE_PROVIDER_SITE_OTHER): Payer: Medicare Other | Admitting: Licensed Clinical Social Worker

## 2021-05-06 DIAGNOSIS — E1169 Type 2 diabetes mellitus with other specified complication: Secondary | ICD-10-CM

## 2021-05-06 DIAGNOSIS — E1159 Type 2 diabetes mellitus with other circulatory complications: Secondary | ICD-10-CM

## 2021-05-06 DIAGNOSIS — E785 Hyperlipidemia, unspecified: Secondary | ICD-10-CM

## 2021-05-06 DIAGNOSIS — I152 Hypertension secondary to endocrine disorders: Secondary | ICD-10-CM

## 2021-05-06 DIAGNOSIS — E119 Type 2 diabetes mellitus without complications: Secondary | ICD-10-CM | POA: Diagnosis not present

## 2021-05-06 DIAGNOSIS — I480 Paroxysmal atrial fibrillation: Secondary | ICD-10-CM

## 2021-05-06 DIAGNOSIS — K219 Gastro-esophageal reflux disease without esophagitis: Secondary | ICD-10-CM

## 2021-05-06 DIAGNOSIS — C61 Malignant neoplasm of prostate: Secondary | ICD-10-CM | POA: Diagnosis not present

## 2021-05-06 DIAGNOSIS — M109 Gout, unspecified: Secondary | ICD-10-CM

## 2021-05-06 DIAGNOSIS — Z86718 Personal history of other venous thrombosis and embolism: Secondary | ICD-10-CM

## 2021-05-06 NOTE — Chronic Care Management (AMB) (Signed)
Chronic Care Management    Clinical Social Work Note  05/06/2021 Name: Alan [name]. MRN: [id number] DOB: [birth year]  Alan [name]. is a 80 y.o. year old male who is a primary care patient of Dettinger, Fransisca Kaufmann, MD. The CCM team was consulted to assist the patient with chronic disease management and/or care coordination needs related to: [employer] .   Engaged with patient by telephone for follow up visit in response to provider referral for social work chronic care management and care coordination services.   Consent to Services:  The patient was given information about Chronic Care Management services, agreed to services, and gave verbal consent prior to initiation of services.  Please see initial visit note for detailed documentation.   Patient agreed to services and consent obtained.   Assessment: Review of patient past medical history, allergies, medications, and health status, including review of relevant consultants reports was performed today as part of a comprehensive evaluation and provision of chronic care management and care coordination services.     SDOH (Social Determinants of Health) assessments and interventions performed:  SDOH Interventions   Flowsheet Row Most Recent Value  SDOH Interventions   Depression Interventions/Treatment  --  [informed client of LCSW support and of RNCM support]       Advanced Directives Status: See [employer] application for related entries.  CCM Care Plan  Allergies  Allergen Reactions  . Allopurinol Other (See Comments)    Significantly decreased WBC's  . Colchicine Other (See Comments)    Significantly decreased WBC's    Outpatient Encounter Medications as of 05/06/2021  Medication Sig  . acetaminophen (TYLENOL) 500 MG tablet Take 500 mg by mouth every 6 (six) hours as needed.  Marland Kitchen amLODipine (NORVASC) 10 MG tablet Take 1 tablet (10 mg total) by mouth every evening.  . Blood Glucose Monitoring Suppl (ONE TOUCH ULTRA MINI)  w/Device KIT 1 each by Does not apply route 2 (two) times daily.  . chlorthalidone (HYGROTON) 25 MG tablet Take 1 tablet by mouth once daily  . Ferrous Sulfate (IRON) 325 (65 Fe) MG TABS Take 1 tablet by mouth once daily with breakfast  . finasteride (PROSCAR) 5 MG tablet Take 1 tablet (5 mg total) by mouth daily.  . fluticasone (FLONASE) 50 MCG/ACT nasal spray Place 1 spray into both nostrils 2 (two) times daily as needed for allergies or rhinitis.  Marland Kitchen glucose blood (ONE TOUCH ULTRA TEST) test strip Use to check BG once daily.  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 lisinopril (ZESTRIL) 20 MG tablet Take 1.5 tablets (30 mg total) by mouth daily.  . metFORMIN (GLUCOPHAGE) 500 MG tablet Take by mouth 2 (two) times daily with a meal.  . metoprolol succinate (TOPROL-XL) 100 MG 24 hr tablet Take 1 tablet (100 mg total) by mouth daily. Take with or immediately following a meal.  . Multiple Vitamins-Minerals (CENTRUM SILVER 50+MEN) TABS Take by mouth daily.  Marland Kitchen omeprazole (PRILOSEC) 20 MG capsule Take 1 capsule (20 mg total) by mouth daily.  . potassium chloride SA (KLOR-CON) 20 MEQ tablet TAKE 1  BY MOUTH ONCE DAILY  . pravastatin (PRAVACHOL) 40 MG tablet TAKE 1 TABLET BY MOUTH ONCE DAILY IN THE EVENING  . Sodium Sulfate-Mag Sulfate-KCl (SUTAB) [phone] MG TABS Take by mouth as needed.   . warfarin (COUMADIN) 5 MG tablet TAKE 1 & 1/2 (ONE & ONE-HALF) TABLETS BY MOUTH ONCE DAILY 6 IN THE EVENING   No facility-administered encounter medications on file as  of 05/06/2021.    Patient Active Problem List   Diagnosis Date Noted  . 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 PVC (premature ventricular contraction) 06/26/2017  . Acute venous embolism and thrombosis of deep vessels of proximal lower extremity ([place]) [I82.4Y9] 06/29/2016  . Long term (current) use of anticoagulants [Z79.01] 06/29/2016  . Primary hypercoagulable state ([place]) [D68.59] 06/29/2016  . History of DVT (deep vein thrombosis)  06/29/2016  . 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 Paroxysmal atrial fibrillation ([place]) 04/19/2016  . Hypertension associated with diabetes ([place]) 04/09/2016  . Type 2 diabetes mellitus without complications ([place]) 00/18/0970  . GERD (gastroesophageal reflux disease) 01/24/2016  . ED (erectile dysfunction)   . Prostate cancer ([place])   . Hyperlipidemia associated with type 2 diabetes mellitus ([place]) 04/29/2013  . Gout 04/29/2013    Conditions to be addressed/monitored: Monitor client completion of ADLs as able   Care Plan : LCSW care plan  Updates made by Katha Cabal, LCSW since 05/06/2021 12:00 AM    Problem: Coping Skills (General Plan of Care)     Goal: Coping Skills Enhanced;Complete ADLs daily, as able   Start Date: 05/06/2021  Expected End Date: 08/05/2021  This Visit's Progress: On track  Priority: Medium  Note:   Current barriers:   . Patient in need of assistance with connecting to community resources for possible help with completing ADLs daily  . Patient is unable to independently navigate community resource options without care coordination support . Mobility issues . Pain issues  Clinical Goals:  patient will work with SW monthly to address concerns related to ADLs completion of client  Client will work with SW monthly to address mobility issues of client  Clinical Interventions:  . Collaboration with Dettinger, Fransisca Kaufmann, MD regarding development and update of comprehensive plan of care as evidenced by provider attestation and co-signature . Assessment of needs, barriers of client . Talked with client about upcoming client medical appointments . Talked with client about pain issues of client . Talked with client about appetite of client . Talked with clinet about sleeping issues of client . Talked with client about medication procurement of client . Talked with client about walking of client . Talked with client about relaxation techniques  (likes to watch baseball games,enjoys being outdoors) . Talked with client about family support (support from granddaughter who lives with client and his wife) . Talked with client about health of [place] . Talked with client about transport needs (he said he drives to appointments as needed) . Encouraged client to call RNCM as needed for nursing support  Patient Coping Skills: Takes medications as prescribed Attends scheduled medical appointments  Patient Deficits: Mobility issues Difficulty in completing some daily ADLs  Patient Goals:  In next 30 days, patient will: Contact RNCM or LCSW as needed for CCM support Attend scheduled medical appointments Communicate regularly with spouse, Arville Go, about current needs of client -  Follow Up Plan: LCSW to call client on 06/21/21     Norva Riffle.Aminah Zabawa MSW, LCSW Licensed Clinical Social Worker [facility] Care Management [phone]

## 2021-05-06 NOTE — Patient Instructions (Signed)
Visit Information  PATIENT GOALS: Goals Addressed            This Visit's Progress   . Protect My Health;Complete ADLs daily, as able       Timeframe:  Short-Term Goal Priority:  Medium Progress: On Track Start Date:             05/06/21                Expected End Date:           08/05/21            Follow Up Date 06/21/21   Protect My Health (Patient)  Complete ADLs daily as able    Why is this important?    Screening tests can find diseases early when they are easier to treat.   Your doctor or nurse will talk with you about which tests are important for you.   Getting shots for common diseases like the flu and shingles will help prevent them.      Patient Coping Skills: Takes medications as prescribed Attends scheduled medical appointments  Patient Deficits: Mobility issues Difficulty in completing some daily ADLs  Patient Goals:  In next 30 days, patient will: Contact RNCM or LCSW as needed for CCM support Attend scheduled medical appointments Communicate regularly with spouse, Alan [name], about current needs of client -  Follow Up Plan: LCSW to call client on 06/21/21        Alan [name].Alan [name] MSW, LCSW Licensed Clinical Social Worker [facility] Care Management [phone]

## 2021-05-10 ENCOUNTER — Other Ambulatory Visit: Payer: Self-pay | Admitting: Family Medicine

## 2021-05-10 DIAGNOSIS — E785 Hyperlipidemia, unspecified: Secondary | ICD-10-CM

## 2021-05-10 DIAGNOSIS — E1169 Type 2 diabetes mellitus with other specified complication: Secondary | ICD-10-CM

## 2021-05-12 ENCOUNTER — Ambulatory Visit (INDEPENDENT_AMBULATORY_CARE_PROVIDER_SITE_OTHER): Payer: Medicare Other | Admitting: Family Medicine

## 2021-05-12 ENCOUNTER — Other Ambulatory Visit: Payer: Self-pay

## 2021-05-12 ENCOUNTER — Encounter: Payer: Self-pay | Admitting: Family Medicine

## 2021-05-12 VITALS — BP 139/54 | HR 59 | Temp 97.5°F | Resp 20 | Ht 65.0 in | Wt 170.0 lb

## 2021-05-12 DIAGNOSIS — D6859 Other primary thrombophilia: Secondary | ICD-10-CM | POA: Diagnosis not present

## 2021-05-12 DIAGNOSIS — I480 Paroxysmal atrial fibrillation: Secondary | ICD-10-CM | POA: Diagnosis not present

## 2021-05-12 DIAGNOSIS — Z7901 Long term (current) use of anticoagulants: Secondary | ICD-10-CM

## 2021-05-12 DIAGNOSIS — Z86718 Personal history of other venous thrombosis and embolism: Secondary | ICD-10-CM

## 2021-05-12 DIAGNOSIS — K219 Gastro-esophageal reflux disease without esophagitis: Secondary | ICD-10-CM | POA: Diagnosis not present

## 2021-05-12 LAB — COAGUCHEK XS/INR WAIVED
INR: 2.4 — ABNORMAL HIGH (ref 0.9–1.1)
Prothrombin Time: 28.3 s

## 2021-05-12 MED ORDER — OMEPRAZOLE 20 MG PO CPDR: 20.0000 mg | DELAYED_RELEASE_CAPSULE | Freq: Every day | ORAL | 3 refills | Status: DC

## 2021-05-12 NOTE — Progress Notes (Signed)
BP (!) 139/54   Pulse (!) 59   Temp (!) 97.5 F (36.4 C)   Resp 20   Ht '5\' 5"'  (1.651 m)   Wt 170 lb (77.1 kg)   SpO2 97%   BMI 28.29 kg/m    Subjective:   Patient ID: Alan Netters., male    DOB: 17-Dec-1940, 80 y.o.   MRN: [id number]  HPI: Alan Thoma. is a 80 y.o. male presenting on 05/12/2021 for Anticoagulation (4-6 week INR follow up )   HPI Coumadin recheck Target goal: 2.0-3.0 Reason on anticoagulation: Chronic A. fib with history of DVTs Patient denies any bruising or bleeding or chest pain or palpitations   Relevant past medical, surgical, family and social history reviewed and updated as indicated. Interim medical history since our last visit reviewed. Allergies and medications reviewed and updated.  Review of Systems  Constitutional: Negative for chills and fever.  Respiratory: Negative for shortness of breath and wheezing.   Cardiovascular: Negative for chest pain and leg swelling.  Musculoskeletal: Negative for back pain and gait problem.  Skin: Negative for rash.  All other systems reviewed and are negative.   Per HPI unless specifically indicated above   Allergies as of 05/12/2021      Reactions   Allopurinol Other (See Comments)   Significantly decreased WBC's   Colchicine Other (See Comments)   Significantly decreased WBC's      Medication List       Accurate as of May 12, 2021 11:15 AM. If you have any questions, ask your nurse or doctor.        acetaminophen 500 MG tablet Commonly known as: TYLENOL Take 500 mg by mouth every 6 (six) hours as needed.   amLODipine 10 MG tablet Commonly known as: NORVASC Take 1 tablet (10 mg total) by mouth every evening.   Centrum Silver 50+Men Tabs Take by mouth daily.   chlorthalidone 25 MG tablet Commonly known as: HYGROTON Take 1 tablet by mouth once daily   finasteride 5 MG tablet Commonly known as: PROSCAR Take 1 tablet (5 mg total) by mouth daily.   fluticasone 50 MCG/ACT nasal  spray Commonly known as: FLONASE Place 1 spray into both nostrils 2 (two) times daily as needed for allergies or rhinitis.   glucose blood test strip Commonly known as: ONE TOUCH ULTRA TEST Use to check BG once daily.  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   metFORMIN 500 MG tablet Commonly known as: GLUCOPHAGE Take by mouth 2 (two) times daily with a meal.   metoprolol succinate 100 MG 24 hr tablet Commonly known as: TOPROL-XL Take 1 tablet (100 mg total) by mouth daily. Take with or immediately following a meal.   omeprazole 20 MG capsule Commonly known as: PRILOSEC Take 1 capsule (20 mg total) by mouth daily.   ONE TOUCH ULTRA MINI w/Device Kit 1 each by Does not apply route 2 (two) times daily.   potassium chloride SA 20 MEQ tablet Commonly known as: KLOR-CON TAKE 1  BY MOUTH ONCE DAILY   pravastatin 40 MG tablet Commonly known as: PRAVACHOL TAKE 1 TABLET BY MOUTH ONCE DAILY IN THE EVENING   Sutab [phone] MG Tabs Generic drug: Sodium Sulfate-Mag Sulfate-KCl Take by mouth as needed.   warfarin 5  MG tablet Commonly known as: COUMADIN Take as directed by the anticoagulation clinic. If you are unsure how to take this medication, talk to your nurse or doctor. Original instructions: TAKE 1 & 1/2 (ONE & ONE-HALF) TABLETS BY MOUTH ONCE DAILY 6 IN THE EVENING        Objective:   BP (!) 139/54   Pulse (!) 59   Temp (!) 97.5 F (36.4 C)   Resp 20   Ht '5\' 5"'  (1.651 m)   Wt 170 lb (77.1 kg)   SpO2 97%   BMI 28.29 kg/m   Wt Readings from Last 3 Encounters:  05/12/21 170 lb (77.1 kg)  04/05/21 169 lb (76.7 kg)  02/23/21 166 lb (75.3 kg)    Physical Exam Vitals and nursing note reviewed.  Constitutional:      General: He is not in acute distress.    Appearance: He is well-developed. He  is not diaphoretic.  Eyes:     General: No scleral icterus.    Conjunctiva/sclera: Conjunctivae normal.  Neck:     Thyroid: No thyromegaly.  Cardiovascular:     Rate and Rhythm: Normal rate. Rhythm irregular.     Heart sounds: Normal heart sounds. No murmur heard.   Pulmonary:     Effort: Pulmonary effort is normal. No respiratory distress.     Breath sounds: Normal breath sounds. No wheezing.  Neurological:     Mental Status: He is alert and oriented to person, place, and time.     Coordination: Coordination normal.  Psychiatric:        Behavior: Behavior normal.     Results for orders placed or performed in visit on 04/05/21  CoaguChek XS/INR Waived  Result Value Ref Range   INR 2.4 (H) 0.9 - 1.1   Prothrombin Time 28.7 sec    Assessment & Plan:   Problem List Items Addressed This Visit      Cardiovascular and Mediastinum   Paroxysmal atrial fibrillation ([facility]) - Primary   Relevant Orders   CoaguChek XS/INR Waived     Digestive   GERD (gastroesophageal reflux disease)   Relevant Medications   omeprazole (PRILOSEC) 20 MG capsule     Hematopoietic and Hemostatic   Primary hypercoagulable state ([place]) [D68.59]     Other   Long term (current) use of anticoagulants [Z79.01]   History of DVT (deep vein thrombosis)   Relevant Orders   CoaguChek XS/INR Waived      Description   Continue current dose to take 1 tablet every day of the week except for Mondays, and Saturdays take 1 1/2 tablets  INR was 2.4 (goal 2-3)   Follow-up 4-6 weeks     Follow up plan: Return if symptoms worsen or fail to improve, for 4 to 6-week INR with diabetes and cholesterol recheck.  Counseling provided for all of the vaccine components Orders Placed This Encounter  Procedures  . CoaguChek XS/INR [place], MD [facility] 05/12/2021, 11:15 AM

## 2021-05-19 ENCOUNTER — Other Ambulatory Visit: Payer: Self-pay

## 2021-05-19 ENCOUNTER — Other Ambulatory Visit: Payer: Medicare Other

## 2021-05-19 DIAGNOSIS — C61 Malignant neoplasm of prostate: Secondary | ICD-10-CM

## 2021-05-20 LAB — PSA: Prostate Specific Ag, Serum: 5.4 ng/mL — ABNORMAL HIGH (ref 0.0–4.0)

## 2021-05-26 ENCOUNTER — Encounter: Payer: Self-pay | Admitting: Urology

## 2021-05-26 ENCOUNTER — Ambulatory Visit: Payer: Medicare Other | Admitting: Urology

## 2021-05-26 ENCOUNTER — Other Ambulatory Visit: Payer: Self-pay

## 2021-05-26 VITALS — BP 157/65 | HR 67

## 2021-05-26 DIAGNOSIS — R972 Elevated prostate specific antigen [PSA]: Secondary | ICD-10-CM | POA: Diagnosis not present

## 2021-05-26 DIAGNOSIS — N138 Other obstructive and reflux uropathy: Secondary | ICD-10-CM | POA: Diagnosis not present

## 2021-05-26 DIAGNOSIS — N403 Nodular prostate with lower urinary tract symptoms: Secondary | ICD-10-CM

## 2021-05-26 DIAGNOSIS — C61 Malignant neoplasm of prostate: Secondary | ICD-10-CM | POA: Diagnosis not present

## 2021-05-26 DIAGNOSIS — R351 Nocturia: Secondary | ICD-10-CM | POA: Diagnosis not present

## 2021-05-26 LAB — URINALYSIS, ROUTINE W REFLEX MICROSCOPIC
Bilirubin, UA: NEGATIVE
Ketones, UA: NEGATIVE
Leukocytes,UA: NEGATIVE
Nitrite, UA: NEGATIVE
Protein,UA: NEGATIVE
Specific Gravity, UA: 1.02 (ref 1.005–1.030)
Urobilinogen, Ur: 0.2 mg/dL (ref 0.2–1.0)
pH, UA: 6 (ref 5.0–7.5)

## 2021-05-26 LAB — MICROSCOPIC EXAMINATION: Bacteria, UA: NONE SEEN

## 2021-05-26 NOTE — Progress Notes (Signed)
Urological Symptom Review  Patient is experiencing the following symptoms: Get up at night to urinate Leakage of urine   Review of Systems  Gastrointestinal (upper)  : Negative for upper GI symptoms  Gastrointestinal (lower) : Negative for lower GI symptoms  Constitutional : Negative for symptoms  Skin: Negative for skin symptoms  Eyes: Negative for eye symptoms  Ear/Nose/Throat : Negative for Ear/Nose/Throat symptoms  Hematologic/Lymphatic: Negative for Hematologic/Lymphatic symptoms  Cardiovascular : Negative for cardiovascular symptoms  Respiratory : Negative for respiratory symptoms  Endocrine: Negative for endocrine symptoms  Musculoskeletal: Negative for musculoskeletal symptoms  Neurological: Negative for neurological symptoms  Psychologic: Negative for psychiatric symptoms

## 2021-05-26 NOTE — Progress Notes (Signed)
Subjective:  1. Prostate cancer ([place])   2. Elevated PSA   3. Nodular prostate with urinary obstruction   4. Nocturia      Mr. 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   His PSA has declined to 5.4 from 8.1 in 3/22.  He is voiding ok with moderate LUTS and an IPSS of 14 with nocturia x 3.   He has had no further gross hematuria and his UA is unremarkable today.   He remains on warfarin and his INR is 2.4 on 05/12/21.     On initial biopsy, he had only 5% in one core and elected active survelliance. He had a repeat biopsy on 12/24/13 that showed a single core with <5% Gleason 6 in the right mid lateral prostate. His last biopsy was on 06/28/18 and only showed 2 cores with atypia. He is on finasteride and has had  a slowly rising PSA that was up to 6.4 prior to his most recent biopsy on 06/25/20.  The nadir on finasteride was 1.8.    The repeat biopsy demonstrated 6 positive cores with 2 cores with 7(4+3) on the right and 4 cores with low volume Gleason 6 disease.    His prostate volume is 71m. IPSS is 9.  A bone scan on 07/06/20 was negative.    The CT on 07/29/20 was negative for mets.   His PSA was 6.2 prior to therapy starting finasteride.      IPSS     Row Name 05/26/21 1500         International Prostate Symptom Score   How often have you had the sensation of not emptying your bladder? Less than half the time     How often have you had to urinate less than every two hours? About half the time     How often have you found you stopped and started again several times when you urinated? Less than half the time     How often have you found it difficult to postpone urination? Less than half the time     How often have you had a weak urinary stream? Less than 1 in 5 times     How  often have you had to strain to start urination? Less than 1 in 5 times     How many times did you typically get up at night to urinate? 3 Times     Total IPSS Score 14           Quality of Life due to urinary symptoms     If you were to spend the rest of your life with your urinary condition just the way it is now how would you feel about that? Mixed              ROS:  ROS:  A complete review of systems was performed.  All systems are negative except for pertinent findings as noted.   ROS  Allergies  Allergen Reactions   Allopurinol Other (See Comments)    Significantly decreased WBC's   Colchicine Other (See Comments)    Significantly decreased WBC's    Outpatient Encounter Medications as of 05/26/2021  Medication Sig   acetaminophen (TYLENOL) 500 MG tablet Take 500 mg by mouth every 6 (six) hours as  needed.   amLODipine (NORVASC) 10 MG tablet Take 1 tablet (10 mg total) by mouth every evening.   Blood Glucose Monitoring Suppl (ONE TOUCH ULTRA MINI) w/Device KIT 1 each by Does not apply route 2 (two) times daily.   chlorthalidone (HYGROTON) 25 MG tablet Take 1 tablet by mouth once daily   Ferrous Sulfate (IRON) 325 (65 Fe) MG TABS Take 1 tablet by mouth once daily with breakfast   finasteride (PROSCAR) 5 MG tablet Take 1 tablet (5 mg total) by mouth daily.   fluticasone (FLONASE) 50 MCG/ACT nasal spray Place 1 spray into both nostrils 2 (two) times daily as needed for allergies or rhinitis.   glucose blood (ONE TOUCH ULTRA TEST) test strip Use to check BG once daily.  Dx:  Type 2 DM controlled E11.9   lisinopril (ZESTRIL) 20 MG tablet Take 1.5 tablets (30 mg total) by mouth daily.   metFORMIN (GLUCOPHAGE) 500 MG tablet Take by mouth 2 (two) times daily with a meal.   metoprolol succinate (TOPROL-XL) 100 MG 24 hr tablet Take 1 tablet (100 mg total) by mouth daily. Take with or immediately following a meal.   Multiple Vitamins-Minerals (CENTRUM SILVER 50+MEN) TABS Take by  mouth daily.   omeprazole (PRILOSEC) 20 MG capsule Take 1 capsule (20 mg total) by mouth daily.   potassium chloride SA (KLOR-CON) 20 MEQ tablet TAKE 1  BY MOUTH ONCE DAILY   pravastatin (PRAVACHOL) 40 MG tablet TAKE 1 TABLET BY MOUTH ONCE DAILY IN THE EVENING   Sodium Sulfate-Mag Sulfate-KCl (SUTAB) [phone] MG TABS Take by mouth as needed.    warfarin (COUMADIN) 5 MG tablet TAKE 1 & 1/2 (ONE & ONE-HALF) TABLETS BY MOUTH ONCE DAILY 6 IN THE EVENING   No facility-administered encounter medications on file as of 05/26/2021.    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   found during hospital stay with pancreatitis,  started coumadin;   last Abd CT in epic 07-29-2020  chronic thrombus portal venous   GERD (gastroesophageal reflux disease)    Heart murmur    History of acute pancreatitis    2016   necrotizing pancreatitis   History of adenomatous polyp of colon    History of DVT of lower extremity    History of GI bleed 08/2019   upper gi bleed due to duodenal ulcer   History of gout yrs ago   Hypertension    followed by pcp   Hypertrophic cardiomyopathy ([place])    followed by cardiology   Internal hemorrhoids    Mixed hyperlipidemia    Normocytic anemia    PAF (paroxysmal atrial fibrillation) ([place]) 04/2016   cardiologist-- dr h. Tamala Julian--  event monitor 07-04-2017 epic, NSR/ PAflutter with occasional RVR/  PACs/ PVCs ;  echo 05-16-2017 epic,  moderate LVH with severe asymptomic septal hypertrophy, G1DD, EF 60-65%,  mild AR, mild LAE   Pancreatic pseudocyst    Peyronie's disease    Prostate cancer [facility]) urologist-- dr Alzena Gerber/ oncologist-- dr Tammi Klippel   first dx 08/ 2009 Gleason 3+3 active survelliance;  until bx 07/ 2021  Stage T2b, Gleason 4+3   Right inguinal hernia    Type 2 diabetes  mellitus ([place])    followed by pcp---  (11-03-2020 per pt currently  not checking blood sugar due to glucose monitor broken)   Wears glasses    Wears partial dentures    upper and lower    Past Surgical History:  Procedure Laterality Date   CHOLECYSTECTOMY N/A 11/26/2015   Procedure: LAPAROSCOPIC CHOLECYSTECTOMY;  Surgeon: Aviva Signs, MD;  Location: [facility];  Service: General;  Laterality: N/A;   COLONOSCOPY  last one 09-06-2020  dr stark   CYSTOSCOPY N/A 11/09/2020   Procedure: Erlene Quan;  Surgeon: Irine Seal, MD;  Location: [facility];  Service: Urology;  Laterality: N/A;   ESOPHAGOGASTRODUODENOSCOPY  10-06-2019  _0    FLEXIBLE BRONCHOSCOPY Bilateral 04/14/2016   Procedure: FLEXIBLE BRONCHOSCOPY;  Surgeon: Sinda Du, MD;  Location: [facility];  Service: Cardiopulmonary;  Laterality: Bilateral;   HEMORRHOID SURGERY  yrs ago   RADIOACTIVE SEED IMPLANT N/A 11/09/2020   Procedure: RADIOACTIVE SEED IMPLANT/BRACHYTHERAPY IMPLANT;  Surgeon: Irine Seal, MD;  Location[facility];  Service: Urology;  Laterality: N/A;   SPACE OAR INSTILLATION N/A 11/09/2020   Procedure: SPACE OAR INSTILLATION;  Surgeon: Irine Seal, MD;  Location: [facility];  Service: Urology;  Laterality: N/A;   UMBILICAL HERNIA REPAIR N/A 11/26/2015   Procedure: UMBILICAL HERNIORRHAPHY;  Surgeon: Aviva Signs, MD;  Location: [facility];  Service: General;  Laterality: N/A;    Social History   Socioeconomic History   Marital status: Married    Spouse name: [name]   Number of children: 4   Years of education: Not on file   Highest education level: 10th grade  Occupational History   Occupation: Retired     Fish farm manager: [employer]  Tobacco Use   Smoking status: Former    Years: 5.00    Pack years: 0.00    Types: Cigarettes    Quit date: 12/11/1986    Years since quitting: 34.4   Smokeless tobacco: Never  Vaping Use   Vaping Use: Never used  Substance and  Sexual Activity   Alcohol use: No   Drug use: Never   Sexual activity: Yes  Other Topics Concern   Not on file  Social History Narrative   Not on file   Social Determinants of Health   Financial Resource Strain: Not on file  Food Insecurity: Not on file  Transportation Needs: Not on file  Physical Activity: Not on file  Stress: Not on file  Social Connections: Not on file  Intimate Partner Violence: Not on file    Family History  Problem Relation Age of Onset   Stroke Mother    Hypertension Mother    Alzheimer's disease Mother    Hypertension Father    Brain cancer Sister    Breast cancer Sister    Hypertension Sister    Deep vein thrombosis Brother    Hypertension Brother    Colon cancer Neg Hx    Pancreatic disease Neg Hx    Esophageal cancer Neg Hx    Rectal cancer Neg Hx    Stomach cancer Neg Hx        Objective: Vitals:   05/26/21 1438  BP: (!) 157/65  Pulse: 67     Physical Exam Lab Results  Component Value Date   PSA1 5.4 (H) 05/19/2021   PSA1 8.1 (H) 02/24/2021   PSA1 6.5 (H) 09/10/2019    Lab Results:  Results for orders placed or performed in visit on 05/26/21 (from the past 24 hour(s))  Urinalysis, Routine w reflex microscopic     Status: Abnormal  Collection Time: 05/26/21  2:32 PM  Result Value Ref Range   Specific Gravity, UA 1.020 1.005 - 1.030   pH, UA 6.0 5.0 - 7.5   Color, UA Yellow Yellow   Appearance Ur Clear Clear   Leukocytes,UA Negative Negative   Protein,UA Negative Negative/Trace   Glucose, UA 2+ (A) Negative   Ketones, UA Negative Negative   RBC, UA Trace (A) Negative   Bilirubin, UA Negative Negative   Urobilinogen, Ur 0.2 0.2 - 1.0 mg/dL   Nitrite, UA Negative Negative   Microscopic Examination See below:    Narrative   Performed at:  [facility] [address], [place], [place]  [id number] Lab Director: Mina Marble MT, Phone:  [phone]  Microscopic Examination     Status: None    Collection Time: 05/26/21  2:32 PM   Urine  Result Value Ref Range   WBC, UA 0-5 0 - 5 /hpf   RBC 0-2 0 - 2 /hpf   Epithelial Cells (non renal) 0-10 0 - 10 /hpf   Mucus, UA Present Not Estab.   Bacteria, UA None seen None seen/Few   Narrative   Performed at:  [facility] [address], [place], [place]  [id number] Lab Director: Anna, Phone:  [phone]    BMET No results for input(s): NA, K, CL, CO2, GLUCOSE, BUN, CREATININE, CALCIUM in the last 72 hours. PSA PSA  Date Value Ref Range Status  05/20/2020 6.4 (H) < OR = 4.0 ng/mL Final    Comment:    The total PSA value from this assay system is  standardized against the WHO standard. The test  result will be approximately 20% lower when compared  to the equimolar-standardized total PSA (Beckman  Coulter). Comparison of serial PSA results should be  interpreted with this fact in mind. . This test was performed using the Siemens  chemiluminescent method. Values obtained from  different assay methods cannot be used interchangeably. PSA levels, regardless of value, should not be interpreted as absolute evidence of the presence or absence of disease.   11/12/2014 2.0 0.0 - 4.0 ng/mL Final    Comment:    Roche ECLIA methodology. According to the American Urological Association, Serum PSA should decrease and remain at undetectable levels after radical prostatectomy. The AUA defines biochemical recurrence as an initial PSA value 0.2 ng/mL or greater followed by a subsequent confirmatory PSA value 0.2 ng/mL or greater. Values obtained with different assay methods or kits cannot be used interchangeably. Results cannot be interpreted as absolute evidence of the presence or absence of malignant disease.    No results found for: TESTOSTERONE  UA has 11-30 RBC's.   Studies/Results: No results found.   Assessment & Plan: Prostate cancer. He is doing well s/p seeds for T2b N0 M0  Gleason 7(4+3)  disease with a PSA that was rising on finasteride and right prostate induration.  His PSA is falling.  He will return in 6 months with labs.   Nodular prostate with obstruction and nocturia.  He will continue the finasteride.    Hematuria.  He has had no further bleeding.  His INR was 2.4 on last check.   No orders of the defined types were placed in this encounter.    Orders Placed This Encounter  Procedures   Microscopic Examination   Urinalysis, Routine w reflex microscopic   PSA    Standing Status:   Future    Standing Expiration Date:  05/26/2022      Return in about 6 months (around 11/25/2021) for with PSA.   CC: Dettinger, Fransisca Kaufmann, MD      Irine Seal 05/26/2021

## 2021-06-15 ENCOUNTER — Other Ambulatory Visit: Payer: Self-pay | Admitting: Family Medicine

## 2021-06-15 DIAGNOSIS — E1169 Type 2 diabetes mellitus with other specified complication: Secondary | ICD-10-CM

## 2021-06-21 ENCOUNTER — Telehealth: Payer: Medicare Other

## 2021-06-29 ENCOUNTER — Ambulatory Visit (INDEPENDENT_AMBULATORY_CARE_PROVIDER_SITE_OTHER): Payer: Medicare Other | Admitting: Family Medicine

## 2021-06-29 ENCOUNTER — Other Ambulatory Visit: Payer: Self-pay

## 2021-06-29 ENCOUNTER — Encounter: Payer: Self-pay | Admitting: Family Medicine

## 2021-06-29 VITALS — BP 138/54 | HR 53 | Ht 65.0 in | Wt 168.0 lb

## 2021-06-29 DIAGNOSIS — E119 Type 2 diabetes mellitus without complications: Secondary | ICD-10-CM | POA: Diagnosis not present

## 2021-06-29 DIAGNOSIS — Z86718 Personal history of other venous thrombosis and embolism: Secondary | ICD-10-CM | POA: Diagnosis not present

## 2021-06-29 DIAGNOSIS — D6859 Other primary thrombophilia: Secondary | ICD-10-CM | POA: Diagnosis not present

## 2021-06-29 DIAGNOSIS — E1159 Type 2 diabetes mellitus with other circulatory complications: Secondary | ICD-10-CM

## 2021-06-29 DIAGNOSIS — I480 Paroxysmal atrial fibrillation: Secondary | ICD-10-CM | POA: Diagnosis not present

## 2021-06-29 DIAGNOSIS — Z7901 Long term (current) use of anticoagulants: Secondary | ICD-10-CM

## 2021-06-29 DIAGNOSIS — E1169 Type 2 diabetes mellitus with other specified complication: Secondary | ICD-10-CM

## 2021-06-29 DIAGNOSIS — I152 Hypertension secondary to endocrine disorders: Secondary | ICD-10-CM | POA: Diagnosis not present

## 2021-06-29 DIAGNOSIS — E785 Hyperlipidemia, unspecified: Secondary | ICD-10-CM

## 2021-06-29 LAB — BAYER DCA HB A1C WAIVED: HB A1C (BAYER DCA - WAIVED): 9 % — ABNORMAL HIGH (ref <7.0)

## 2021-06-29 LAB — COAGUCHEK XS/INR WAIVED
INR: 3.1 — ABNORMAL HIGH (ref 0.9–1.1)
Prothrombin Time: 36.9 s

## 2021-06-29 NOTE — Progress Notes (Signed)
BP (!) 138/54   Pulse (!) 53   Ht '5\' 5"'  (1.651 m)   Wt 168 lb (76.2 kg)   SpO2 98%   BMI 27.96 kg/m    Subjective:   Patient ID: Alan [name]., male    DOB: 1941/04/29, 80 y.o.   MRN: [id number]  HPI: Alan [name]. is a 80 y.o. male presenting on 06/29/2021 for Medical Management of Chronic Issues, Atrial Fibrillation, and Diabetes   HPI Type 2 diabetes mellitus Patient comes in today for recheck of his diabetes. Patient has been currently taking metformin 500 twice daily, A1c is up at 9.0.  He does admit that he has been drinking a lot more sweet tea. Patient is currently on an ACE inhibitor/ARB. Patient has not seen an ophthalmologist this year. Patient denies any issues with their feet. The symptom started onset as an adult hypertension and ARE RELATED TO DM   Hypertension Patient is currently on amlodipine and lisinopril and metoprolol, and their blood pressure today is 138/54. Patient denies any lightheadedness or dizziness. Patient denies headaches, blurred vision, chest pains, shortness of breath, or weakness. Denies any side effects from medication and is content with current medication.   Hyperlipidemia Patient is coming in for recheck of his hyperlipidemia. The patient is currently taking pravastatin. They deny any issues with myalgias or history of liver damage from it. They deny any focal numbness or weakness or chest pain.   Relevant past medical, surgical, family and social history reviewed and updated as indicated. Interim medical history since our last visit reviewed. Allergies and medications reviewed and updated.  Review of Systems  Constitutional:  Negative for chills and fever.  Respiratory:  Negative for shortness of breath and wheezing.   Cardiovascular:  Negative for chest pain and leg swelling.  Musculoskeletal:  Negative for back pain and gait problem.  Skin:  Negative for rash.  All other systems reviewed and are negative.  Per HPI unless specifically  indicated above   Allergies as of 06/29/2021       Reactions   Allopurinol Other (See Comments)   Significantly decreased WBC's   Colchicine Other (See Comments)   Significantly decreased WBC's        Medication List        Accurate as of June 29, 2021  2:31 PM. If you have any questions, ask your nurse or doctor.          acetaminophen 500 MG tablet Commonly known as: TYLENOL Take 500 mg by mouth every 6 (six) hours as needed.   amLODipine 10 MG tablet Commonly known as: NORVASC Take 1 tablet (10 mg total) by mouth every evening.   Centrum Silver 50+Men Tabs Take by mouth daily.   chlorthalidone 25 MG tablet Commonly known as: HYGROTON Take 1 tablet by mouth once daily   finasteride 5 MG tablet Commonly known as: PROSCAR Take 1 tablet (5 mg total) by mouth daily.   fluticasone 50 MCG/ACT nasal spray Commonly known as: FLONASE Place 1 spray into both nostrils 2 (two) times daily as needed for allergies or rhinitis.   glucose blood test strip Commonly known as: ONE TOUCH ULTRA TEST Use to check BG once daily.  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   metFORMIN 500 MG  tablet Commonly known as: GLUCOPHAGE Take by mouth 2 (two) times daily with a meal.   metoprolol succinate 100 MG 24 hr tablet Commonly known as: TOPROL-XL Take 1 tablet (100 mg total) by mouth daily. Take with or immediately following a meal.   omeprazole 20 MG capsule Commonly known as: PRILOSEC Take 1 capsule (20 mg total) by mouth daily.   ONE TOUCH ULTRA MINI w/Device Kit 1 each by Does not apply route 2 (two) times daily.   potassium chloride SA 20 MEQ tablet Commonly known as: KLOR-CON TAKE 1  BY MOUTH ONCE DAILY   pravastatin 40 MG tablet Commonly known as: PRAVACHOL TAKE 1 TABLET BY MOUTH ONCE DAILY IN  THE EVENING   Sutab [phone] MG Tabs Generic drug: Sodium Sulfate-Mag Sulfate-KCl Take by mouth as needed.   warfarin 5 MG tablet Commonly known as: COUMADIN Take as directed by the anticoagulation clinic. If you are unsure how to take this medication, talk to your nurse or doctor. Original instructions: TAKE 1 & 1/2 (ONE & ONE-HALF) TABLETS BY MOUTH ONCE DAILY 6 IN THE EVENING         Objective:   BP (!) 138/54   Pulse (!) 53   Ht '5\' 5"'  (1.651 m)   Wt 168 lb (76.2 kg)   SpO2 98%   BMI 27.96 kg/m   Wt Readings from Last 3 Encounters:  06/29/21 168 lb (76.2 kg)  05/12/21 170 lb (77.1 kg)  04/05/21 169 lb (76.7 kg)    Physical Exam Vitals and nursing note reviewed.  Constitutional:      General: He is not in acute distress.    Appearance: He is well-developed. He is not diaphoretic.  Eyes:     General: No scleral icterus.    Conjunctiva/sclera: Conjunctivae normal.  Neck:     Thyroid: No thyromegaly.  Cardiovascular:     Rate and Rhythm: Normal rate. Rhythm irregular.     Heart sounds: Normal heart sounds. No murmur heard. Pulmonary:     Effort: Pulmonary effort is normal. No respiratory distress.     Breath sounds: Normal breath sounds. No wheezing.  Musculoskeletal:        General: Normal range of motion.     Cervical back: Neck supple.  Lymphadenopathy:     Cervical: No cervical adenopathy.  Skin:    General: Skin is warm and dry.     Findings: No rash.  Neurological:     Mental Status: He is alert and oriented to person, place, and time.     Coordination: Coordination normal.  Psychiatric:        Behavior: Behavior normal.      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  Counseling provided for all of the vaccine components Orders Placed This Encounter  Procedures   Bayer [facility] Hb A1c Waived   CoaguChek XS/INR [place] Philip Kotlyar, MD [facility] 06/29/2021, 2:31 PM

## 2021-07-18 ENCOUNTER — Other Ambulatory Visit: Payer: Self-pay | Admitting: Family Medicine

## 2021-07-18 DIAGNOSIS — E1169 Type 2 diabetes mellitus with other specified complication: Secondary | ICD-10-CM

## 2021-07-20 ENCOUNTER — Telehealth: Payer: Self-pay | Admitting: Family Medicine

## 2021-07-20 DIAGNOSIS — R32 Unspecified urinary incontinence: Secondary | ICD-10-CM

## 2021-07-20 DIAGNOSIS — Z7901 Long term (current) use of anticoagulants: Secondary | ICD-10-CM

## 2021-07-20 DIAGNOSIS — R040 Epistaxis: Secondary | ICD-10-CM

## 2021-07-20 NOTE — Telephone Encounter (Signed)
Worked in pt for tomorrow at 11:25. Wife made aware that we will check his INR and his urine.  Future orders placed

## 2021-07-20 NOTE — Telephone Encounter (Signed)
Pt has had three nosebleeds in the past week. Pt is able to get them stopped.  Wife thinks he may need to be checked.   She also wants to let Dettinger know that he cannot "hold his water". He has had this symtom for a few months. Denies pain, bleeding, no confusion. States that he just does not want to tell the doctor.

## 2021-07-20 NOTE — Telephone Encounter (Signed)
Yes please get an appointment for both of these issues, he probably needs his INR checked to make sure it is not too thin and we can talk about the other issue as well.  Please schedule appointment ASAP

## 2021-07-21 ENCOUNTER — Encounter: Payer: Self-pay | Admitting: Family Medicine

## 2021-07-21 ENCOUNTER — Ambulatory Visit (INDEPENDENT_AMBULATORY_CARE_PROVIDER_SITE_OTHER): Payer: Medicare Other | Admitting: Family Medicine

## 2021-07-21 ENCOUNTER — Other Ambulatory Visit: Payer: Self-pay

## 2021-07-21 VITALS — BP 145/61 | HR 53 | Ht 65.0 in | Wt 164.0 lb

## 2021-07-21 DIAGNOSIS — Z86718 Personal history of other venous thrombosis and embolism: Secondary | ICD-10-CM | POA: Diagnosis not present

## 2021-07-21 DIAGNOSIS — Z7901 Long term (current) use of anticoagulants: Secondary | ICD-10-CM

## 2021-07-21 DIAGNOSIS — R040 Epistaxis: Secondary | ICD-10-CM | POA: Diagnosis not present

## 2021-07-21 DIAGNOSIS — R32 Unspecified urinary incontinence: Secondary | ICD-10-CM | POA: Diagnosis not present

## 2021-07-21 DIAGNOSIS — D6859 Other primary thrombophilia: Secondary | ICD-10-CM | POA: Diagnosis not present

## 2021-07-21 LAB — URINALYSIS
Bilirubin, UA: NEGATIVE
Glucose, UA: NEGATIVE
Ketones, UA: NEGATIVE
Leukocytes,UA: NEGATIVE
Nitrite, UA: NEGATIVE
Protein,UA: NEGATIVE
RBC, UA: NEGATIVE
Specific Gravity, UA: 1.015 (ref 1.005–1.030)
Urobilinogen, Ur: 0.2 mg/dL (ref 0.2–1.0)
pH, UA: 6.5 (ref 5.0–7.5)

## 2021-07-21 LAB — COAGUCHEK XS/INR WAIVED
INR: 2.4 — ABNORMAL HIGH (ref 0.9–1.1)
Prothrombin Time: 28.6 s

## 2021-07-21 NOTE — Patient Instructions (Signed)
Recommended using nasal saline spray before bedtime and in the morning to help clear up the congestion

## 2021-07-21 NOTE — Progress Notes (Signed)
BP (!) 145/61   Pulse (!) 53   Ht _0  (1.651 m)   Wt 164 lb (74.4 kg)   SpO2 100%   BMI 27.29 kg/m    Subjective:   Patient ID: Alan [name]., male    DOB: 08-Apr-1941, 80 y.o.   MRN: [id number]  HPI: Alan [name]. is a 80 y.o. male presenting on 07/21/2021 for Epistaxis (Over the past few weeks pt has had a few nosebleeds.) and Urinary Incontinence   HPI Patient is coming in for nosebleeds Patient has had a few nosebleeds over the past couple weeks, has always been able to stop them but then is coming in because his wife was concerned about them and wanted him checked out and have his INR checked.  He says they have not bled extensively but may be a few minutes but his wife is more concerned that it has been having on a consistent basis.  Patient says his wife also wanted to get checked on his urine and says that he has been having some urinary incontinence and frequency.  Patient denies this being a major issue.  But he would like it checked out for his wife.  Relevant past medical, surgical, family and social history reviewed and updated as indicated. Interim medical history since our last visit reviewed. Allergies and medications reviewed and updated.  Review of Systems  Constitutional:  Negative for chills and fever.  HENT:  Positive for nosebleeds. Negative for postnasal drip, rhinorrhea and sinus pain.   Eyes:  Negative for discharge.  Respiratory:  Negative for shortness of breath and wheezing.   Cardiovascular:  Negative for chest pain and leg swelling.  Genitourinary:  Positive for frequency. Negative for decreased urine volume, difficulty urinating, dysuria, hematuria and urgency.  Musculoskeletal:  Negative for back pain and gait problem.  Skin:  Negative for rash.  All other systems reviewed and are negative.  Per HPI unless specifically indicated above   Allergies as of 07/21/2021       Reactions   Allopurinol Other (See Comments)   Significantly decreased  WBC's   Colchicine Other (See Comments)   Significantly decreased WBC's        Medication List        Accurate as of July 21, 2021 11:59 PM. If you have any questions, ask your nurse or doctor.          acetaminophen 500 MG tablet Commonly known as: TYLENOL Take 500 mg by mouth every 6 (six) hours as needed.   amLODipine 10 MG tablet Commonly known as: NORVASC Take 1 tablet (10 mg total) by mouth every evening.   Centrum Silver 50+Men Tabs Take by mouth daily.   chlorthalidone 25 MG tablet Commonly known as: HYGROTON Take 1 tablet by mouth once daily   finasteride 5 MG tablet Commonly known as: PROSCAR Take 1 tablet (5 mg total) by mouth daily.   fluticasone 50 MCG/ACT nasal spray Commonly known as: FLONASE Place 1 spray into both nostrils 2 (two) times daily as needed for allergies or rhinitis.   glucose blood test strip Commonly known as: ONE TOUCH ULTRA TEST Use to check BG once daily.  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   metFORMIN 1000 MG tablet Commonly known as: GLUCOPHAGE  TAKE 1 TABLET BY MOUTH TWICE DAILY WITH A MEAL   metoprolol succinate 100 MG 24 hr tablet Commonly known as: TOPROL-XL Take 1 tablet (100 mg total) by mouth daily. Take with or immediately following a meal.   omeprazole 20 MG capsule Commonly known as: PRILOSEC Take 1 capsule (20 mg total) by mouth daily.   ONE TOUCH ULTRA MINI w/Device Kit 1 each by Does not apply route 2 (two) times daily.   potassium chloride SA 20 MEQ tablet Commonly known as: KLOR-CON TAKE 1  BY MOUTH ONCE DAILY   pravastatin 40 MG tablet Commonly known as: PRAVACHOL TAKE 1 TABLET BY MOUTH ONCE DAILY IN THE EVENING   Sutab [phone] MG Tabs Generic drug: Sodium Sulfate-Mag Sulfate-KCl Take by mouth as needed.    warfarin 5 MG tablet Commonly known as: COUMADIN Take as directed by the anticoagulation clinic. If you are unsure how to take this medication, talk to your nurse or doctor. Original instructions: TAKE 1 & 1/2 (ONE & ONE-HALF) TABLETS BY MOUTH ONCE DAILY 6 IN THE EVENING         Objective:   BP (!) 145/61   Pulse (!) 53   Ht _0  (1.651 m)   Wt 164 lb (74.4 kg)   SpO2 100%   BMI 27.29 kg/m   Wt Readings from Last 3 Encounters:  07/29/21 164 lb 2 oz (74.4 kg)  07/21/21 164 lb (74.4 kg)  06/29/21 168 lb (76.2 kg)    Physical Exam Vitals and nursing note reviewed.  Constitutional:      Appearance: Normal appearance.  HENT:     Nose: Nose normal. No congestion or rhinorrhea.     Mouth/Throat:     Mouth: Mucous membranes are moist.     Pharynx: Oropharynx is clear. No oropharyngeal exudate or posterior oropharyngeal erythema.  Skin:    Findings: No bruising.  Neurological:     Mental Status: He is alert.    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  Counseling provided for all of the vaccine components No orders of the defined  types were placed in this encounter.   Caryl Pina, MD [facility] 08/01/2021, 7:50 AM

## 2021-07-23 LAB — URINE CULTURE

## 2021-07-29 ENCOUNTER — Other Ambulatory Visit: Payer: Self-pay

## 2021-07-29 ENCOUNTER — Telehealth: Payer: Self-pay | Admitting: *Deleted

## 2021-07-29 ENCOUNTER — Ambulatory Visit (INDEPENDENT_AMBULATORY_CARE_PROVIDER_SITE_OTHER): Payer: Medicare Other | Admitting: Family Medicine

## 2021-07-29 ENCOUNTER — Ambulatory Visit (INDEPENDENT_AMBULATORY_CARE_PROVIDER_SITE_OTHER): Payer: Medicare Other | Admitting: Licensed Clinical Social Worker

## 2021-07-29 ENCOUNTER — Encounter: Payer: Self-pay | Admitting: Family Medicine

## 2021-07-29 VITALS — BP 139/52 | HR 82 | Temp 99.7°F | Ht 65.0 in | Wt 164.1 lb

## 2021-07-29 DIAGNOSIS — C61 Malignant neoplasm of prostate: Secondary | ICD-10-CM

## 2021-07-29 DIAGNOSIS — E119 Type 2 diabetes mellitus without complications: Secondary | ICD-10-CM | POA: Diagnosis not present

## 2021-07-29 DIAGNOSIS — M109 Gout, unspecified: Secondary | ICD-10-CM

## 2021-07-29 DIAGNOSIS — K219 Gastro-esophageal reflux disease without esophagitis: Secondary | ICD-10-CM

## 2021-07-29 DIAGNOSIS — I480 Paroxysmal atrial fibrillation: Secondary | ICD-10-CM

## 2021-07-29 DIAGNOSIS — E1159 Type 2 diabetes mellitus with other circulatory complications: Secondary | ICD-10-CM | POA: Diagnosis not present

## 2021-07-29 DIAGNOSIS — R5383 Other fatigue: Secondary | ICD-10-CM | POA: Diagnosis not present

## 2021-07-29 DIAGNOSIS — I152 Hypertension secondary to endocrine disorders: Secondary | ICD-10-CM | POA: Diagnosis not present

## 2021-07-29 DIAGNOSIS — E1169 Type 2 diabetes mellitus with other specified complication: Secondary | ICD-10-CM | POA: Diagnosis not present

## 2021-07-29 DIAGNOSIS — Z86718 Personal history of other venous thrombosis and embolism: Secondary | ICD-10-CM

## 2021-07-29 DIAGNOSIS — E785 Hyperlipidemia, unspecified: Secondary | ICD-10-CM

## 2021-07-29 NOTE — Progress Notes (Signed)
Acute Office Visit  Subjective:    Patient ID: Alan [name]., male    DOB: 1941-06-02, 80 y.o.   MRN: [id number]  Chief Complaint  Patient presents with   Fatigue     HPI Patient is in today for fatigue. Burech reports that his wife has been worried about him and feels like he has been tired and fatigued lately. He reports that he feels fine and denies feeling fatigued or tired. He denies fatigue, weakness, edema, chest pain, shortness of breath, dizziness, bleeding, fever, nausea, vomiting, diarrhea, or weight loss. He has a follow up appointment with his PCP in 10 days.    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   found during hospital stay with pancreatitis,  started coumadin;   last Abd CT in epic 07-29-2020  chronic thrombus portal venous   GERD (gastroesophageal reflux disease)    Heart murmur    History of acute pancreatitis    2016   necrotizing pancreatitis   History of adenomatous polyp of colon    History of DVT of lower extremity    History of GI bleed 08/2019   upper gi bleed due to duodenal ulcer   History of gout yrs ago   Hypertension    followed by pcp   Hypertrophic cardiomyopathy ([place])    followed by cardiology   Internal hemorrhoids    Mixed hyperlipidemia    Normocytic anemia    PAF (paroxysmal atrial fibrillation) ([place]) 04/2016   cardiologist-- dr h. Tamala Julian--  event monitor 07-04-2017 epic, NSR/ PAflutter with occasional RVR/  PACs/ PVCs ;  echo 05-16-2017 epic,  moderate LVH with severe asymptomic septal hypertrophy, G1DD, EF 60-65%,  mild AR, mild LAE   Pancreatic pseudocyst    Peyronie's disease    Prostate cancer [facility]) urologist-- dr wrenn/ oncologist-- dr Tammi Klippel   first dx 08/ 2009 Gleason 3+3 active survelliance;  until bx 07/ 2021  Stage T2b,  Gleason 4+3   Right inguinal hernia    Type 2 diabetes mellitus ([place])    followed by pcp---  (11-03-2020 per pt currently not checking blood sugar due to glucose monitor broken)   Wears glasses    Wears partial dentures    upper and lower    Past Surgical History:  Procedure Laterality Date   CHOLECYSTECTOMY N/A 11/26/2015   Procedure: LAPAROSCOPIC CHOLECYSTECTOMY;  Surgeon: Aviva Signs, MD;  Location: [facility];  Service: General;  Laterality: N/A;   COLONOSCOPY  last one 09-06-2020  dr stark   CYSTOSCOPY N/A 11/09/2020   Procedure: Erlene Quan;  Surgeon: Irine Seal, MD;  Location[facility];  Service: Urology;  Laterality: N/A;   ESOPHAGOGASTRODUODENOSCOPY  10-06-2019  '@[facility]'    FLEXIBLE BRONCHOSCOPY Bilateral 04/14/2016   Procedure: FLEXIBLE BRONCHOSCOPY;  Surgeon: Sinda Du, MD;  Location: [facility];  Service: Cardiopulmonary;  Laterality: Bilateral;   HEMORRHOID SURGERY  yrs ago   RADIOACTIVE SEED IMPLANT N/A 11/09/2020   Procedure: RADIOACTIVE SEED IMPLANT/BRACHYTHERAPY IMPLANT;  Surgeon: Irine Seal, MD;  Location: [facility];  Service: Urology;  Laterality: N/A;   SPACE OAR INSTILLATION N/A 11/09/2020   Procedure: SPACE OAR INSTILLATION;  Surgeon: Irine Seal, MD;  Location: [facility];  Service: Urology;  Laterality:  N/A;   UMBILICAL HERNIA REPAIR N/A 11/26/2015   Procedure: UMBILICAL HERNIORRHAPHY;  Surgeon: Aviva Signs, MD;  Location: [facility];  Service: General;  Laterality: N/A;    Family History  Problem Relation Age of Onset   Stroke Mother    Hypertension Mother    Alzheimer's disease Mother    Hypertension Father    Brain cancer Sister    Breast cancer Sister    Hypertension Sister    Deep vein thrombosis Brother    Hypertension Brother    Colon cancer Neg Hx    Pancreatic disease Neg Hx    Esophageal cancer Neg Hx    Rectal cancer Neg Hx    Stomach cancer Neg Hx     Social History    Socioeconomic History   Marital status: Married    Spouse name: [name]   Number of children: 4   Years of education: Not on file   Highest education level: 10th grade  Occupational History   Occupation: Retired     Fish farm manager: [employer]  Tobacco Use   Smoking status: Former    Years: 5.00    Types: Cigarettes    Quit date: 12/11/1986    Years since quitting: 34.6   Smokeless tobacco: Never  Vaping Use   Vaping Use: Never used  Substance and Sexual Activity   Alcohol use: No   Drug use: Never   Sexual activity: Yes  Other Topics Concern   Not on file  Social History Narrative   Not on file   Social Determinants of Health   Financial Resource Strain: Not on file  Food Insecurity: Not on file  Transportation Needs: Not on file  Physical Activity: Insufficiently Active   Days of Exercise per Week: 1 day   Minutes of Exercise per Session: 20 min  Stress: Not on file  Social Connections: Not on file  Intimate Partner Violence: Not on file    Outpatient Medications Prior to Visit  Medication Sig Dispense Refill   acetaminophen (TYLENOL) 500 MG tablet Take 500 mg by mouth every 6 (six) hours as needed.     amLODipine (NORVASC) 10 MG tablet Take 1 tablet (10 mg total) by mouth every evening. 90 tablet 3   Blood Glucose Monitoring Suppl (ONE TOUCH ULTRA MINI) w/Device KIT 1 each by Does not apply route 2 (two) times daily. 1 kit 1   chlorthalidone (HYGROTON) 25 MG tablet Take 1 tablet by mouth once daily 90 tablet 3   Ferrous Sulfate (IRON) 325 (65 Fe) MG TABS Take 1 tablet by mouth once daily with breakfast 90 tablet 1   finasteride (PROSCAR) 5 MG tablet Take 1 tablet (5 mg total) by mouth daily. 90 tablet 3   fluticasone (FLONASE) 50 MCG/ACT nasal spray Place 1 spray into both nostrils 2 (two) times daily as needed for allergies or rhinitis. 16 g 6   glucose blood (ONE TOUCH ULTRA TEST) test strip Use to check BG once daily.  Dx:  Type 2 DM controlled E11.9 100 each 4    lisinopril (ZESTRIL) 20 MG tablet Take 1.5 tablets (30 mg total) by mouth daily. 135 tablet 3   metFORMIN (GLUCOPHAGE) 1000 MG tablet TAKE 1 TABLET BY MOUTH TWICE DAILY WITH A MEAL 180 tablet 0   metoprolol succinate (TOPROL-XL) 100 MG 24 hr tablet Take 1 tablet (100 mg total) by mouth daily. Take with or immediately following a meal. 90 tablet 3   Multiple Vitamins-Minerals (CENTRUM SILVER 50+MEN) TABS Take by  mouth daily.     omeprazole (PRILOSEC) 20 MG capsule Take 1 capsule (20 mg total) by mouth daily. 90 capsule 3   potassium chloride SA (KLOR-CON) 20 MEQ tablet TAKE 1  BY MOUTH ONCE DAILY 90 tablet 0   pravastatin (PRAVACHOL) 40 MG tablet TAKE 1 TABLET BY MOUTH ONCE DAILY IN THE EVENING 90 tablet 0   Sodium Sulfate-Mag Sulfate-KCl (SUTAB) [phone] MG TABS Take by mouth as needed.      warfarin (COUMADIN) 5 MG tablet TAKE 1 & 1/2 (ONE & ONE-HALF) TABLETS BY MOUTH ONCE DAILY 6 IN THE EVENING 90 tablet 0   No facility-administered medications prior to visit.    Allergies  Allergen Reactions   Allopurinol Other (See Comments)    Significantly decreased WBC's   Colchicine Other (See Comments)    Significantly decreased WBC's    Review of Systems As per HPI.     Objective:    Physical Exam Vitals and nursing note reviewed.  Constitutional:      General: He is not in acute distress.    Appearance: Normal appearance. He is not ill-appearing, toxic-appearing or diaphoretic.  Cardiovascular:     Rate and Rhythm: Normal rate and regular rhythm.     Heart sounds: Normal heart sounds. No murmur heard. Pulmonary:     Effort: Pulmonary effort is normal. No respiratory distress.     Breath sounds: Normal breath sounds.  Abdominal:     General: Bowel sounds are normal. There is no distension.     Palpations: Abdomen is soft.     Tenderness: There is no abdominal tenderness. There is no guarding or rebound.  Musculoskeletal:     Right lower leg: No edema.     Left lower leg: No  edema.  Skin:    General: Skin is warm and dry.  Neurological:     Mental Status: He is alert and oriented to person, place, and time.  Psychiatric:        Mood and Affect: Mood normal.        Behavior: Behavior normal.    BP (!) 139/52   Pulse 82   Temp 99.7 F (37.6 C) (Oral)   Ht '5\' 5"'  (1.651 m)   Wt 164 lb 2 oz (74.4 kg)   SpO2 97%   BMI 27.31 kg/m  Wt Readings from Last 3 Encounters:  07/29/21 164 lb 2 oz (74.4 kg)  07/21/21 164 lb (74.4 kg)  06/29/21 168 lb (76.2 kg)    Health Maintenance Due  Topic Date Due   Hepatitis C Screening  Never done   Zoster Vaccines- Shingrix (1 of 2) Never done   COVID-19 Vaccine (4 - Booster for Moderna series) 01/13/2021   INFLUENZA VACCINE  07/11/2021    There are no preventive care reminders to display for this patient.   Lab Results  Component Value Date   TSH 1.842 04/10/2016   Lab Results  Component Value Date   WBC 5.4 01/14/2021   HGB 10.6 (L) 01/14/2021   HCT 31.3 (L) 01/14/2021   MCV 89 01/14/2021   PLT 234 01/14/2021   Lab Results  Component Value Date   NA 141 01/14/2021   K 3.6 01/14/2021   CO2 24 01/14/2021   GLUCOSE 159 (H) 01/14/2021   BUN 19 01/14/2021   CREATININE 1.18 01/14/2021   BILITOT 0.5 01/14/2021   ALKPHOS 65 01/14/2021   AST 16 01/14/2021   ALT 14 01/14/2021   PROT 6.8 01/14/2021   ALBUMIN  3.9 01/14/2021   CALCIUM 9.1 01/14/2021   ANIONGAP 11 11/08/2020   Lab Results  Component Value Date   CHOL 89 (L) 01/14/2021   Lab Results  Component Value Date   HDL 25 (L) 01/14/2021   Lab Results  Component Value Date   LDLCALC 27 01/14/2021   Lab Results  Component Value Date   TRIG 238 (H) 01/14/2021   Lab Results  Component Value Date   CHOLHDL 3.6 01/14/2021   Lab Results  Component Value Date   HGBA1C 9.0 (H) 06/29/2021       Assessment & Plan:   Jaxxen was seen today for fatigue.  Diagnoses and all orders for this visit:  Fatigue, unspecified type Patient reports  that he feels fine, he wife is concerned that he is fatigued. Negative ROS today and benign exam. He declined labs today as he has a follow up appointment with PCP in 10 days.   Return to office for new or worsening symptoms, or if symptoms persist.   The patient indicates understanding of these issues and agrees with the plan.  Gwenlyn Perking, FNP

## 2021-07-29 NOTE — Chronic Care Management (AMB) (Signed)
Chronic Care Management    Clinical Social Work Note  07/29/2021 Name: Alan [name]. MRN: [id number] DOB: [birth year]  Alan [name]. is a 80 y.o. year old male who is a primary care patient of Dettinger, Fransisca Kaufmann, MD. The CCM team was consulted to assist the patient with chronic disease management and/or care coordination needs related to: [employer] .   Engaged with patient / spouse of patient, [name], by telephone for follow up visit in response to provider referral for social work chronic care management and care coordination services.   Consent to Services:  The patient was given information about Chronic Care Management services, agreed to services, and gave verbal consent prior to initiation of services.  Please see initial visit note for detailed documentation.   Patient agreed to services and consent obtained.   Assessment: Review of patient past medical history, allergies, medications, and health status, including review of relevant consultants reports was performed today as part of a comprehensive evaluation and provision of chronic care management and care coordination services.     SDOH (Social Determinants of Health) assessments and interventions performed:  SDOH Interventions    Flowsheet Row Most Recent Value  SDOH Interventions   Physical Activity Interventions Other (Comments)  [name], spouse of client, stated to LCSW on 07/29/21 that client was walking very slowly and had decreased energy.  She said he had a gait that looked like he was "trotting" (her words)]  Depression Interventions/Treatment  --  [informed [name] of LCSW support for client and of RNCM support for client]        Advanced Directives Status: See Vynca application for related entries.  CCM Care Plan  Allergies  Allergen Reactions   Allopurinol Other (See Comments)    Significantly decreased WBC's   Colchicine Other (See Comments)    Significantly decreased WBC's     Outpatient Encounter Medications as of 07/29/2021  Medication Sig   acetaminophen (TYLENOL) 500 MG tablet Take 500 mg by mouth every 6 (six) hours as needed.   amLODipine (NORVASC) 10 MG tablet Take 1 tablet (10 mg total) by mouth every evening.   Blood Glucose Monitoring Suppl (ONE TOUCH ULTRA MINI) w/Device KIT 1 each by Does not apply route 2 (two) times daily.   chlorthalidone (HYGROTON) 25 MG tablet Take 1 tablet by mouth once daily   Ferrous Sulfate (IRON) 325 (65 Fe) MG TABS Take 1 tablet by mouth once daily with breakfast   finasteride (PROSCAR) 5 MG tablet Take 1 tablet (5 mg total) by mouth daily.   fluticasone (FLONASE) 50 MCG/ACT nasal spray Place 1 spray into both nostrils 2 (two) times daily as needed for allergies or rhinitis.   glucose blood (ONE TOUCH ULTRA TEST) test strip Use to check BG once daily.  Dx:  Type 2 DM controlled E11.9   lisinopril (ZESTRIL) 20 MG tablet Take 1.5 tablets (30 mg total) by mouth daily.   metFORMIN (GLUCOPHAGE) 1000 MG tablet TAKE 1 TABLET BY MOUTH TWICE DAILY WITH A MEAL   metoprolol succinate (TOPROL-XL) 100 MG 24 hr tablet Take 1 tablet (100 mg total) by mouth daily. Take with or immediately following a meal.   Multiple Vitamins-Minerals (CENTRUM SILVER 50+MEN) TABS Take by mouth daily.   omeprazole (PRILOSEC) 20 MG capsule Take 1 capsule (20 mg total) by mouth daily.   potassium chloride SA (KLOR-CON) 20 MEQ tablet TAKE 1  BY MOUTH ONCE DAILY   pravastatin (PRAVACHOL) 40 MG tablet TAKE 1  TABLET BY MOUTH ONCE DAILY IN THE EVENING   Sodium Sulfate-Mag Sulfate-KCl (SUTAB) [phone] MG TABS Take by mouth as needed.    warfarin (COUMADIN) 5 MG tablet TAKE 1 & 1/2 (ONE & ONE-HALF) TABLETS BY MOUTH ONCE DAILY 6 IN THE EVENING   No facility-administered encounter medications on file as of 07/29/2021.    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   Paroxysmal atrial fibrillation ([place]) 04/19/2016   Hypertension associated with diabetes ([place]) 04/09/2016   Type 2 diabetes mellitus without complications ([place]) 97/67/3419   GERD (gastroesophageal reflux disease) 01/24/2016   ED (erectile dysfunction)    Prostate cancer ([place])    Hyperlipidemia associated with type 2 diabetes mellitus ([place]) 04/29/2013   Gout 04/29/2013    Conditions to be addressed/monitored: monitor client completion of ADLs, as he is able  Care Plan : LCSW care plan  Updates made by Katha Cabal, LCSW since 07/29/2021 12:00 AM     Problem: Coping Skills (General Plan of Care)      Goal: Coping Skills Enhanced;Complete ADLs daily, as able   Start Date: 07/29/2021  Expected End Date: 10/27/2021  This Visit's Progress: Not on track  Recent Progress: On track  Priority: Medium  Note:   Current barriers:   Patient in need of assistance with connecting to community resources for possible help with completing ADLs daily  Patient is unable to independently navigate community resource options without care coordination support Mobility issues Pain issues  Clinical Goals:  patient will work with SW monthly to address concerns related to ADLs completion of client  Client will work with SW monthly to address mobility issues of client  Clinical Interventions:  Collaboration with Dettinger, Fransisca Kaufmann, MD regarding development and update of comprehensive plan of care as evidenced by provider attestation and co-signature Talked with [name], spouse of client about  client needs Talked with Arville Go about appetite of  client Talked with Arville Go about sleeping issues of client Talked with Arville Go about about walking of client. Arville Go said client was walking at a slower pace. She said he seemed fatigued, tired, little energy. [name] spoke with LCSW about fact that it has been a while since client has had a physical exam.  Arville Go also spoke of Dr. Jeffie Pollock, Urologist and his past treatment of client LCSW informed Arville Go today that LCSW would collaborate with Triage Nurse at [facility] and see if Triage Nurse could call her this afternoon to further discuss current needs of client. Arville Go agreed to this plan  Patient Coping Skills: Takes medications as prescribed Attends scheduled medical appointments  Patient Deficits: Mobility issues Difficulty in completing some daily ADLs  Patient Goals:  In next 30 days, patient will: Contact RNCM or LCSW as needed for CCM support Attend scheduled medical appointments Communicate regularly with spouse, Arville Go, about current needs of client -  Follow Up Plan: LCSW to call client or Dasean Brow on 09/08/21 to assess needs of client at that time      Norva Riffle.Alandis Bluemel MSW, LCSW Licensed Clinical Social Worker [facility] [phone]

## 2021-07-29 NOTE — Patient Instructions (Signed)
Visit Information  PATIENT GOALS:  Goals Addressed             This Visit's Progress    Manage My Emotions       Protect My Health;Complete ADLs daily, as able       Timeframe:  Short-Term Goal Priority:  High Progress: On Track Start Date:             07/29/21                Expected End Date:           10/27/21            Follow Up Date 09/08/21   Protect My Health (Patient)  Complete ADLs daily as able    Why is this important?   Screening tests can find diseases early when they are easier to treat.  Your doctor or nurse will talk with you about which tests are important for you.  Getting shots for common diseases like the flu and shingles will help prevent them.     Patient Coping Skills: Takes medications as prescribed Attends scheduled medical appointments  Patient Deficits: Mobility issues Difficulty in completing some daily ADLs  Patient Goals:  In next 30 days, patient will: Contact RNCM or LCSW as needed for CCM support Attend scheduled medical appointments Communicate regularly with spouse, Arville Go, about current needs of client -  Follow Up Plan: LCSW to call client or Eulice Stranz on 09/08/21 to assess client needs at that time.     Norva Riffle.Aryn Safran MSW, LCSW Licensed Clinical Social Worker [facility] Care Management [phone]

## 2021-07-29 NOTE — Telephone Encounter (Signed)
Pt came in for appt. 

## 2021-07-29 NOTE — Telephone Encounter (Signed)
Nicki Reaper, the Education officer, museum, called and said that Finn Tingler is concerned about her husband Alan [name] 1941-02-28 because he is acting extremely tired, weak and not like himself. He was having nosebleeds and they have improved but he still is not acting like normal. She told Nicki Reaper that he has been walking slow and says he has no energy but denies having any other symptoms. The patient has a history of prostate cancer and his wife wants to know what they need to do. They asked if a nurse could call them back to talk about options and mentioned either coming in for a physical with Dettinger or going to see Dr. Jeffie Pollock again. Could you call him? Yvone Neu said he is backed up right now. If so, call [phone] and if not let me know and I will see if Yvone Neu can once he is finished. (Message taken from [place] -front desk)    I LM for them to call back to discuss - was going to offer appt today with on call provider/ acute visit. May need some labs asap.

## 2021-08-02 ENCOUNTER — Ambulatory Visit (INDEPENDENT_AMBULATORY_CARE_PROVIDER_SITE_OTHER): Payer: Medicare Other | Admitting: Family Medicine

## 2021-08-02 ENCOUNTER — Encounter: Payer: Self-pay | Admitting: Family Medicine

## 2021-08-02 DIAGNOSIS — Z20822 Contact with and (suspected) exposure to covid-19: Secondary | ICD-10-CM

## 2021-08-02 DIAGNOSIS — R059 Cough, unspecified: Secondary | ICD-10-CM

## 2021-08-02 DIAGNOSIS — U071 COVID-19: Secondary | ICD-10-CM

## 2021-08-02 NOTE — Progress Notes (Signed)
Virtual Visit via telephone Note Due to COVID-19 pandemic this visit was conducted virtually. This visit type was conducted due to national recommendations for restrictions regarding the COVID-19 Pandemic (e.g. social distancing, sheltering in place) in an effort to limit this patient's exposure and mitigate transmission in our community. All issues noted in this document were discussed and addressed.  A physical exam was not performed with this format.   I connected with Alan Netters. on 08/02/2021 at 1515 by telephone and verified that I am speaking with the correct person using two identifiers. Alan Netters. is currently located at home and family is currently with them during visit. The provider, Monia Pouch, FNP is located in their office at time of visit.  I discussed the limitations, risks, security and privacy concerns of performing an evaluation and management service by telephone and the availability of in person appointments. I also discussed with the patient that there may be a patient responsible charge related to this service. The patient expressed understanding and agreed to proceed.  Subjective:  Patient ID: Alan Netters., male    DOB: [birth year], 80 y.o.   MRN: [id number]  Chief Complaint:  Covid Exposure   HPI: Alan Detjen. is a 80 y.o. male presenting on 08/02/2021 for Covid Exposure   Pt reports being around someone sick recently. States he has not felt good the past 2 days. Has cough, congestion, malaise, and rhinorrhea. Denies fever, chills, weakness, or confusion. Has been taking coricidin with minimal relief of symptoms.   Cough This is a new problem. The current episode started in the past 7 days. The problem has been waxing and waning. The problem occurs every few minutes. The cough is Non-productive. Associated symptoms include nasal congestion and rhinorrhea. Pertinent negatives include no chest pain, chills, ear congestion, ear pain, fever, headaches,  heartburn, hemoptysis, myalgias, postnasal drip, rash, sore throat, shortness of breath, sweats, weight loss or wheezing. Nothing aggravates the symptoms. He has tried OTC cough suppressant for the symptoms. The treatment provided mild relief.    Relevant past medical, surgical, family, and social history reviewed and updated as indicated.  Allergies and medications reviewed and updated.   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   found during hospital stay with pancreatitis,  started coumadin;   last Abd CT in epic 07-29-2020  chronic thrombus portal venous   GERD (gastroesophageal reflux disease)    Heart murmur    History of acute pancreatitis    2016   necrotizing pancreatitis   History of adenomatous polyp of colon    History of DVT of lower extremity    History of GI bleed 08/2019   upper gi bleed due to duodenal ulcer   History of gout yrs ago   Hypertension    followed by pcp   Hypertrophic cardiomyopathy ([place])    followed by cardiology   Internal hemorrhoids    Mixed hyperlipidemia    Normocytic anemia    PAF (paroxysmal atrial fibrillation) ([place]) 04/2016   cardiologist-- dr h. Tamala Julian--  event monitor 07-04-2017 epic, NSR/ PAflutter with occasional RVR/  PACs/ PVCs ;  echo 05-16-2017 epic,  moderate LVH with severe asymptomic septal hypertrophy, G1DD, EF 60-65%,  mild AR, mild LAE  Pancreatic pseudocyst    Peyronie's disease    Prostate cancer [facility]) urologist-- dr wrenn/ oncologist-- dr Tammi Klippel   first dx 08/ 2009 Gleason 3+3 active survelliance;  until bx 07/ 2021  Stage T2b, Gleason 4+3   Right inguinal hernia    Type 2 diabetes mellitus ([place])    followed by pcp---  (11-03-2020 per pt currently not checking blood sugar due to glucose monitor broken)   Wears glasses     Wears partial dentures    upper and lower    Past Surgical History:  Procedure Laterality Date   CHOLECYSTECTOMY N/A 11/26/2015   Procedure: LAPAROSCOPIC CHOLECYSTECTOMY;  Surgeon: Aviva Signs, MD;  Location: [facility];  Service: General;  Laterality: N/A;   COLONOSCOPY  last one 09-06-2020  dr stark   CYSTOSCOPY N/A 11/09/2020   Procedure: Erlene Quan;  Surgeon: Irine Seal, MD;  Location[facility];  Service: Urology;  Laterality: N/A;   ESOPHAGOGASTRODUODENOSCOPY  10-06-2019  '@[facility]'    FLEXIBLE BRONCHOSCOPY Bilateral 04/14/2016   Procedure: FLEXIBLE BRONCHOSCOPY;  Surgeon: Sinda Du, MD;  Location: [facility];  Service: Cardiopulmonary;  Laterality: Bilateral;   HEMORRHOID SURGERY  yrs ago   RADIOACTIVE SEED IMPLANT N/A 11/09/2020   Procedure: RADIOACTIVE SEED IMPLANT/BRACHYTHERAPY IMPLANT;  Surgeon: Irine Seal, MD;  Location[facility];  Service: Urology;  Laterality: N/A;   SPACE OAR INSTILLATION N/A 11/09/2020   Procedure: SPACE OAR INSTILLATION;  Surgeon: Irine Seal, MD;  Location: [facility];  Service: Urology;  Laterality: N/A;   UMBILICAL HERNIA REPAIR N/A 11/26/2015   Procedure: UMBILICAL HERNIORRHAPHY;  Surgeon: Aviva Signs, MD;  Location: [facility];  Service: General;  Laterality: N/A;    Social History   Socioeconomic History   Marital status: Married    Spouse name: Alan [name]   Number of children: 4   Years of education: Not on file   Highest education level: 10th grade  Occupational History   Occupation: Retired     Fish farm manager: [employer]  Tobacco Use   Smoking status: Former    Years: 5.00    Types: Cigarettes    Quit date: 12/11/1986    Years since quitting: 34.6   Smokeless tobacco: Never  Vaping Use   Vaping Use: Never used  Substance and Sexual Activity   Alcohol use: No   Drug use: Never   Sexual activity: Yes  Other Topics Concern   Not on file  Social History Narrative   Not on file   Social  Determinants of Health   Financial Resource Strain: Not on file  Food Insecurity: Not on file  Transportation Needs: Not on file  Physical Activity: Insufficiently Active   Days of Exercise per Week: 1 day   Minutes of Exercise per Session: 20 min  Stress: Not on file  Social Connections: Not on file  Intimate Partner Violence: Not on file    Outpatient Encounter Medications as of 08/02/2021  Medication Sig   acetaminophen (TYLENOL) 500 MG tablet Take 500 mg by mouth every 6 (six) hours as needed.   amLODipine (NORVASC) 10 MG tablet Take 1 tablet (10 mg total) by mouth every evening.   Blood Glucose Monitoring Suppl (ONE TOUCH ULTRA MINI) w/Device KIT 1 each by Does not apply route 2 (two) times daily.   chlorthalidone (HYGROTON) 25 MG tablet Take 1 tablet by mouth once daily   Ferrous Sulfate (IRON) 325 (65 Fe) MG TABS Take 1 tablet by mouth once daily with breakfast  finasteride (PROSCAR) 5 MG tablet Take 1 tablet (5 mg total) by mouth daily.   fluticasone (FLONASE) 50 MCG/ACT nasal spray Place 1 spray into both nostrils 2 (two) times daily as needed for allergies or rhinitis.   glucose blood (ONE TOUCH ULTRA TEST) test strip Use to check BG once daily.  Dx:  Type 2 DM controlled E11.9   lisinopril (ZESTRIL) 20 MG tablet Take 1.5 tablets (30 mg total) by mouth daily.   metFORMIN (GLUCOPHAGE) 1000 MG tablet TAKE 1 TABLET BY MOUTH TWICE DAILY WITH A MEAL   metoprolol succinate (TOPROL-XL) 100 MG 24 hr tablet Take 1 tablet (100 mg total) by mouth daily. Take with or immediately following a meal.   Multiple Vitamins-Minerals (CENTRUM SILVER 50+MEN) TABS Take by mouth daily.   omeprazole (PRILOSEC) 20 MG capsule Take 1 capsule (20 mg total) by mouth daily.   potassium chloride SA (KLOR-CON) 20 MEQ tablet TAKE 1  BY MOUTH ONCE DAILY   pravastatin (PRAVACHOL) 40 MG tablet TAKE 1 TABLET BY MOUTH ONCE DAILY IN THE EVENING   Sodium Sulfate-Mag Sulfate-KCl (SUTAB) [phone] MG TABS Take by  mouth as needed.    warfarin (COUMADIN) 5 MG tablet TAKE 1 & 1/2 (ONE & ONE-HALF) TABLETS BY MOUTH ONCE DAILY 6 IN THE EVENING   No facility-administered encounter medications on file as of 08/02/2021.    Allergies  Allergen Reactions   Allopurinol Other (See Comments)    Significantly decreased WBC's   Colchicine Other (See Comments)    Significantly decreased WBC's    Review of Systems  Constitutional:  Positive for activity change. Negative for appetite change, chills, diaphoresis, fatigue, fever, unexpected weight change and weight loss.  HENT:  Positive for congestion and rhinorrhea. Negative for dental problem, drooling, ear discharge, ear pain, facial swelling, hearing loss, mouth sores, nosebleeds, postnasal drip, sinus pressure, sinus pain, sneezing, sore throat, tinnitus, trouble swallowing and voice change.   Eyes: Negative.   Respiratory:  Positive for cough. Negative for hemoptysis, chest tightness, shortness of breath and wheezing.   Cardiovascular:  Negative for chest pain, palpitations and leg swelling.  Gastrointestinal:  Negative for abdominal pain, blood in stool, constipation, diarrhea, heartburn, nausea and vomiting.  Endocrine: Negative.   Genitourinary:  Negative for decreased urine volume, difficulty urinating, dysuria, frequency and urgency.  Musculoskeletal:  Negative for arthralgias and myalgias.  Skin: Negative.  Negative for rash.  Allergic/Immunologic: Negative.   Neurological:  Negative for dizziness, tremors, seizures, syncope, facial asymmetry, speech difficulty, weakness, light-headedness, numbness and headaches.  Hematological: Negative.   Psychiatric/Behavioral:  Negative for confusion, hallucinations, sleep disturbance and suicidal ideas.   All other systems reviewed and are negative.       Observations/Objective: No vital signs or physical exam, this was a telephone or virtual health encounter.  Pt alert and oriented, answers all questions  appropriately, and able to speak in full sentences.    Assessment and Plan: Verne was seen today for covid exposure.  Diagnoses and all orders for this visit:  Exposure to COVID-19 virus Cough Will obtain COVID-19 testing. If positive, will start on antivirals. Symptomatic care discussed in detail. Pt aware to report any new, worsening, or persistent symptoms.  -     Novel Coronavirus, NAA ([facility])     Follow Up Instructions: Return if symptoms worsen or fail to improve.    I discussed the assessment and treatment plan with the patient. The patient was provided an opportunity to ask questions and all were answered. The  patient agreed with the plan and demonstrated an understanding of the instructions.   The patient was advised to call back or seek an in-person evaluation if the symptoms worsen or if the condition fails to improve as anticipated.  The above assessment and management plan was discussed with the patient. The patient verbalized understanding of and has agreed to the management plan. Patient is aware to call the clinic if they develop any new symptoms or if symptoms persist or worsen. Patient is aware when to return to the clinic for a follow-up visit. Patient educated on when it is appropriate to go to the emergency department.    I provided 12 minutes of non-face-to-face time during this encounter. The call started at 1515. The call ended at 1525. The other time was used for coordination of care.    Monia Pouch, FNP-C [facility] [address] [place], [place] [address] [phone] 08/02/2021

## 2021-08-03 LAB — SARS-COV-2, NAA 2 DAY TAT

## 2021-08-03 LAB — NOVEL CORONAVIRUS, NAA: SARS-CoV-2, NAA: DETECTED — AB

## 2021-08-03 MED ORDER — MOLNUPIRAVIR EUA 200MG CAPSULE: 4.0000 | ORAL_CAPSULE | Freq: Two times a day (BID) | ORAL | 0 refills | Status: AC

## 2021-08-03 NOTE — Addendum Note (Signed)
Addended by: Baruch Gouty on: 08/03/2021 12:55 PM   Modules accepted: Orders

## 2021-08-08 ENCOUNTER — Other Ambulatory Visit: Payer: Self-pay

## 2021-08-08 ENCOUNTER — Encounter: Payer: Self-pay | Admitting: Family Medicine

## 2021-08-08 ENCOUNTER — Ambulatory Visit (INDEPENDENT_AMBULATORY_CARE_PROVIDER_SITE_OTHER): Payer: Medicare Other | Admitting: Family Medicine

## 2021-08-08 VITALS — BP 145/56 | HR 61 | Temp 97.7°F | Ht 65.0 in | Wt 158.6 lb

## 2021-08-08 DIAGNOSIS — D6859 Other primary thrombophilia: Secondary | ICD-10-CM | POA: Diagnosis not present

## 2021-08-08 DIAGNOSIS — Z86718 Personal history of other venous thrombosis and embolism: Secondary | ICD-10-CM | POA: Diagnosis not present

## 2021-08-08 DIAGNOSIS — Z7901 Long term (current) use of anticoagulants: Secondary | ICD-10-CM

## 2021-08-08 LAB — COAGUCHEK XS/INR WAIVED
INR: 2.8 — ABNORMAL HIGH (ref 0.9–1.1)
Prothrombin Time: 33.2 s

## 2021-08-08 NOTE — Progress Notes (Signed)
BP (!) 145/56   Pulse 61   Temp 97.7 F (36.5 C) (Temporal)   Ht '5\' 5"'  (1.651 m)   Wt 158 lb 9.6 oz (71.9 kg)   SpO2 98%   BMI 26.39 kg/m    Subjective:   Patient ID: Alan Netters., male    DOB: 13-Feb-1941, 80 y.o.   MRN: [id number]  HPI: Alan Mulvey. is a 80 y.o. male presenting on 08/08/2021 for Anticoagulation   HPI Coumadin recheck Target goal: 2.0-3.0 Reason on anticoagulation: Chronic A. fib history of thrombi Patient denies any bruising or bleeding or chest pain or palpitations   Patient is recovering from [place] and says that he is doing great, his cough and everything else gone and denies any major issues.  Relevant past medical, surgical, family and social history reviewed and updated as indicated. Interim medical history since our last visit reviewed. Allergies and medications reviewed and updated.  Review of Systems  Constitutional:  Negative for chills and fever.  Respiratory:  Negative for shortness of breath and wheezing.   Cardiovascular:  Negative for chest pain and leg swelling.  Musculoskeletal:  Negative for back pain and gait problem.  Skin:  Negative for rash.  Neurological:  Negative for dizziness, weakness and light-headedness.  All other systems reviewed and are negative.  Per HPI unless specifically indicated above   Allergies as of 08/08/2021       Reactions   Allopurinol Other (See Comments)   Significantly decreased WBC's   Colchicine Other (See Comments)   Significantly decreased WBC's        Medication List        Accurate as of August 08, 2021  1:21 PM. If you have any questions, ask your nurse or doctor.          acetaminophen 500 MG tablet Commonly known as: TYLENOL Take 500 mg by mouth every 6 (six) hours as needed.   amLODipine 10 MG tablet Commonly known as: NORVASC Take 1 tablet (10 mg total) by mouth every evening.   Centrum Silver 50+Men Tabs Take by mouth daily.   chlorthalidone 25 MG tablet Commonly  known as: HYGROTON Take 1 tablet by mouth once daily   finasteride 5 MG tablet Commonly known as: PROSCAR Take 1 tablet (5 mg total) by mouth daily.   fluticasone 50 MCG/ACT nasal spray Commonly known as: FLONASE Place 1 spray into both nostrils 2 (two) times daily as needed for allergies or rhinitis.   glucose blood test strip Commonly known as: ONE TOUCH ULTRA TEST Use to check BG once daily.  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   metFORMIN 1000 MG tablet Commonly known as: GLUCOPHAGE TAKE 1 TABLET BY MOUTH TWICE DAILY WITH A MEAL   metoprolol succinate 100 MG 24 hr tablet Commonly known as: TOPROL-XL Take 1 tablet (100 mg total) by mouth daily. Take with or immediately following a meal.   molnupiravir EUA 200 mg Caps Take 4 capsules (800 mg total) by mouth 2 (two) times daily for 5 days.   omeprazole 20 MG capsule Commonly known as: PRILOSEC Take 1 capsule (20 mg total) by mouth daily.   ONE TOUCH ULTRA MINI w/Device Kit 1 each by Does not apply route 2 (two) times daily.   potassium  chloride SA 20 MEQ tablet Commonly known as: KLOR-CON TAKE 1  BY MOUTH ONCE DAILY   pravastatin 40 MG tablet Commonly known as: PRAVACHOL TAKE 1 TABLET BY MOUTH ONCE DAILY IN THE EVENING   Sutab [phone] MG Tabs Generic drug: Sodium Sulfate-Mag Sulfate-KCl Take by mouth as needed.   warfarin 5 MG tablet Commonly known as: COUMADIN Take as directed by the anticoagulation clinic. If you are unsure how to take this medication, talk to your nurse or doctor. Original instructions: TAKE 1 & 1/2 (ONE & ONE-HALF) TABLETS BY MOUTH ONCE DAILY 6 IN THE EVENING         Objective:   BP (!) 145/56   Pulse 61   Temp 97.7 F (36.5 C) (Temporal)   Ht '5\' 5"'  (1.651 m)   Wt 158 lb 9.6 oz (71.9 kg)   SpO2 98%    BMI 26.39 kg/m   Wt Readings from Last 3 Encounters:  08/08/21 158 lb 9.6 oz (71.9 kg)  07/29/21 164 lb 2 oz (74.4 kg)  07/21/21 164 lb (74.4 kg)    Physical Exam Vitals and nursing note reviewed.  Constitutional:      General: He is not in acute distress.    Appearance: He is well-developed. He is not diaphoretic.  Eyes:     General: No scleral icterus.    Conjunctiva/sclera: Conjunctivae normal.  Neck:     Thyroid: No thyromegaly.  Cardiovascular:     Rate and Rhythm: Normal rate. Rhythm irregular.     Heart sounds: Normal heart sounds. No murmur heard. Pulmonary:     Effort: Pulmonary effort is normal. No respiratory distress.     Breath sounds: Normal breath sounds. No wheezing.  Skin:    General: Skin is warm and dry.     Findings: No rash.  Neurological:     Mental Status: He is alert and oriented to person, place, and time.     Coordination: Coordination normal.  Psychiatric:        Behavior: Behavior normal.    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  Counseling provided for all of the vaccine components Orders Placed This Encounter  Procedures   CoaguChek XS/INR [place] Rayneisha Bouza, MD [facility] 08/08/2021, 1:21 PM

## 2021-08-12 ENCOUNTER — Ambulatory Visit (INDEPENDENT_AMBULATORY_CARE_PROVIDER_SITE_OTHER): Payer: Medicare Other

## 2021-08-12 VITALS — Ht 65.0 in | Wt 158.0 lb

## 2021-08-12 DIAGNOSIS — Z0000 Encounter for general adult medical examination without abnormal findings: Secondary | ICD-10-CM | POA: Diagnosis not present

## 2021-08-12 NOTE — Patient Instructions (Signed)
[name] , Thank you for taking time to come for your Medicare Wellness Visit. I appreciate your ongoing commitment to your health goals. Please review the following plan we discussed and let me know if I can assist you in the future.   Screening recommendations/referrals: Colonoscopy: 09/06/2020 Repeat in 5 years  Recommended yearly ophthalmology/optometry visit for glaucoma screening and checkup Recommended yearly dental visit for hygiene and checkup  Vaccinations: Influenza vaccine: 09/13/2020, due in fall Pneumococcal vaccine: done 03/08/15 09/01/19 Tdap vaccine: done 01/10/13 Shingles vaccine: Shingrix discussed. Please contact your pharmacy for coverage information.     Covid-19: done 10/13/2020 02/16/2020 01/15/2020  Advanced directives: Advance directive discussed with you today. Even though you declined this today, please call our office should you change your mind, and we can give you the proper paperwork for you to fill out.   Conditions/risks identified: Aim for 30 minutes of exercise each day, drink 6-8 glasses of water and eat lots of fruits and vegetables.   Next appointment: Follow up in one year for your annual wellness visit.  Preventive Care 32 Years and Older, Male  Preventive care refers to lifestyle choices and visits with your health care provider that can promote health and wellness. What does preventive care include? A yearly physical exam. This is also called an annual well check. Dental exams once or twice a year. Routine eye exams. Ask your health care provider how often you should have your eyes checked. Personal lifestyle choices, including: Daily care of your teeth and gums. Regular physical activity. Eating a healthy diet. Avoiding tobacco and drug use. Limiting alcohol use. Practicing safe sex. Taking low doses of aspirin every day. Taking vitamin and mineral supplements as recommended by your health care provider. What happens during an annual well  check? The services and screenings done by your health care provider during your annual well check will depend on your age, overall health, lifestyle risk factors, and family history of disease. Counseling  Your health care provider may ask you questions about your: Alcohol use. Tobacco use. Drug use. Emotional well-being. Home and relationship well-being. Sexual activity. Eating habits. History of falls. Memory and ability to understand (cognition). Work and work Statistician. Screening  You may have the following tests or measurements: Height, weight, and BMI. Blood pressure. Lipid and cholesterol levels. These may be checked every 5 years, or more frequently if you are over 78 years old. Skin check. Lung cancer screening. You may have this screening every year starting at age 36 if you have a 30-pack-year history of smoking and currently smoke or have quit within the past 15 years. Fecal occult blood test (FOBT) of the stool. You may have this test every year starting at age 38. Flexible sigmoidoscopy or colonoscopy. You may have a sigmoidoscopy every 5 years or a colonoscopy every 10 years starting at age 55. Prostate cancer screening. Recommendations will vary depending on your family history and other risks. Hepatitis C blood test. Hepatitis B blood test. Sexually transmitted disease (STD) testing. Diabetes screening. This is done by checking your blood sugar (glucose) after you have not eaten for a while (fasting). You may have this done every 1-3 years. Abdominal aortic aneurysm (AAA) screening. You may need this if you are a current or former smoker. Osteoporosis. You may be screened starting at age 39 if you are at high risk. Talk with your health care provider about your test results, treatment options, and if necessary, the need for more tests. Vaccines  Your health care provider may recommend certain vaccines, such as: Influenza vaccine. This is recommended every  year. Tetanus, diphtheria, and acellular pertussis (Tdap, Td) vaccine. You may need a Td booster every 10 years. Zoster vaccine. You may need this after age 56. Pneumococcal 13-valent conjugate (PCV13) vaccine. One dose is recommended after age 83. Pneumococcal polysaccharide (PPSV23) vaccine. One dose is recommended after age 62. Talk to your health care provider about which screenings and vaccines you need and how often you need them. This information is not intended to replace advice given to you by your health care provider. Make sure you discuss any questions you have with your health care provider. Document Released: 12/24/2015 Document Revised: 08/16/2016 Document Reviewed: 09/28/2015 Elsevier Interactive Patient Education  2017 [place] Prevention in the Home Falls can cause injuries. They can happen to people of all ages. There are many things you can do to make your home safe and to help prevent falls. What can I do on the outside of my home? Regularly fix the edges of walkways and driveways and fix any cracks. Remove anything that might make you trip as you walk through a door, such as a raised step or threshold. Trim any bushes or trees on the path to your home. Use bright outdoor lighting. Clear any walking paths of anything that might make someone trip, such as rocks or tools. Regularly check to see if handrails are loose or broken. Make sure that both sides of any steps have handrails. Any raised decks and porches should have guardrails on the edges. Have any leaves, snow, or ice cleared regularly. Use sand or salt on walking paths during winter. Clean up any spills in your garage right away. This includes oil or grease spills. What can I do in the bathroom? Use night lights. Install grab bars by the toilet and in the tub and shower. Do not use towel bars as grab bars. Use non-skid mats or decals in the tub or shower. If you need to sit down in the shower, use a  plastic, non-slip stool. Keep the floor dry. Clean up any water that spills on the floor as soon as it happens. Remove soap buildup in the tub or shower regularly. Attach bath mats securely with double-sided non-slip rug tape. Do not have throw rugs and other things on the floor that can make you trip. What can I do in the bedroom? Use night lights. Make sure that you have a light by your bed that is easy to reach. Do not use any sheets or blankets that are too big for your bed. They should not hang down onto the floor. Have a firm chair that has side arms. You can use this for support while you get dressed. Do not have throw rugs and other things on the floor that can make you trip. What can I do in the kitchen? Clean up any spills right away. Avoid walking on wet floors. Keep items that you use a lot in easy-to-reach places. If you need to reach something above you, use a strong step stool that has a grab bar. Keep electrical cords out of the way. Do not use floor polish or wax that makes floors slippery. If you must use wax, use non-skid floor wax. Do not have throw rugs and other things on the floor that can make you trip. What can I do with my stairs? Do not leave any items on the stairs. Make sure that there are  handrails on both sides of the stairs and use them. Fix handrails that are broken or loose. Make sure that handrails are as long as the stairways. Check any carpeting to make sure that it is firmly attached to the stairs. Fix any carpet that is loose or worn. Avoid having throw rugs at the top or bottom of the stairs. If you do have throw rugs, attach them to the floor with carpet tape. Make sure that you have a light switch at the top of the stairs and the bottom of the stairs. If you do not have them, ask someone to add them for you. What else can I do to help prevent falls? Wear shoes that: Do not have high heels. Have rubber bottoms. Are comfortable and fit you  well. Are closed at the toe. Do not wear sandals. If you use a stepladder: Make sure that it is fully opened. Do not climb a closed stepladder. Make sure that both sides of the stepladder are locked into place. Ask someone to hold it for you, if possible. Clearly mark and make sure that you can see: Any grab bars or handrails. First and last steps. Where the edge of each step is. Use tools that help you move around (mobility aids) if they are needed. These include: Canes. Walkers. Scooters. Crutches. Turn on the lights when you go into a dark area. Replace any light bulbs as soon as they burn out. Set up your furniture so you have a clear path. Avoid moving your furniture around. If any of your floors are uneven, fix them. If there are any pets around you, be aware of where they are. Review your medicines with your doctor. Some medicines can make you feel dizzy. This can increase your chance of falling. Ask your doctor what other things that you can do to help prevent falls. This information is not intended to replace advice given to you by your health care provider. Make sure you discuss any questions you have with your health care provider. Document Released: 09/23/2009 Document Revised: 05/04/2016 Document Reviewed: 01/01/2015 Elsevier Interactive Patient Education  2017 [employer].

## 2021-08-12 NOTE — Progress Notes (Signed)
Subjective:   Alan Bricco. is a 80 y.o. male who presents for Medicare Annual/Subsequent preventive examination. Virtual Visit via Telephone Note  I connected with  Alan [name]. on 08/12/21 at  4:15 PM EDT by telephone and verified that I am speaking with the correct person using two identifiers.  Location: Patient: Home Provider: [facility] Persons participating in the virtual visit: patient/Nurse Health Advisor   I discussed the limitations, risks, security and privacy concerns of performing an evaluation and management service by telephone and the availability of in person appointments. The patient expressed understanding and agreed to proceed.  Interactive audio and video telecommunications were attempted between this nurse and patient, however failed, due to patient having technical difficulties OR patient did not have access to video capability.  We continued and completed visit with audio only.  Some vital signs may be absent or patient reported.   Chriss Driver, LPN  Review of Systems     Cardiac Risk Factors include: advanced age (>51mn, >>25women);diabetes mellitus;dyslipidemia;hypertension;male gender;sedentary lifestyle     Objective:    Today's Vitals   08/12/21 1614  Weight: 158 lb (71.7 kg)  Height: _0  (1.651 m)   Body mass index is 26.29 kg/m.  Advanced Directives 08/12/2021 12/06/2020 08/27/2020 04/09/2020 03/04/2018 01/03/2017 04/12/2016  Does Patient Have a Medical Advance Directive? _1  No No  Would patient like information on creating a medical advance directive? No - Patient declined - - No - Patient declined Yes (ED - Information included in AVS) Yes (MAU/Ambulatory/Procedural Areas - Information given) No - patient declined information    Current Medications (verified) Outpatient Encounter Medications as of 08/12/2021  Medication Sig   acetaminophen (TYLENOL) 500 MG tablet Take 500 mg by mouth every 6 (six) hours as needed.   amLODipine  (NORVASC) 10 MG tablet Take 1 tablet (10 mg total) by mouth every evening.   Blood Glucose Monitoring Suppl (ONE TOUCH ULTRA MINI) w/Device KIT 1 each by Does not apply route 2 (two) times daily.   chlorthalidone (HYGROTON) 25 MG tablet Take 1 tablet by mouth once daily   Ferrous Sulfate (IRON) 325 (65 Fe) MG TABS Take 1 tablet by mouth once daily with breakfast   finasteride (PROSCAR) 5 MG tablet Take 1 tablet (5 mg total) by mouth daily.   fluticasone (FLONASE) 50 MCG/ACT nasal spray Place 1 spray into both nostrils 2 (two) times daily as needed for allergies or rhinitis.   glucose blood (ONE TOUCH ULTRA TEST) test strip Use to check BG once daily.  Dx:  Type 2 DM controlled E11.9   lisinopril (ZESTRIL) 20 MG tablet Take 1.5 tablets (30 mg total) by mouth daily.   metFORMIN (GLUCOPHAGE) 1000 MG tablet TAKE 1 TABLET BY MOUTH TWICE DAILY WITH A MEAL   metoprolol succinate (TOPROL-XL) 100 MG 24 hr tablet Take 1 tablet (100 mg total) by mouth daily. Take with or immediately following a meal.   Multiple Vitamins-Minerals (CENTRUM SILVER 50+MEN) TABS Take by mouth daily.   omeprazole (PRILOSEC) 20 MG capsule Take 1 capsule (20 mg total) by mouth daily.   potassium chloride SA (KLOR-CON) 20 MEQ tablet TAKE 1  BY MOUTH ONCE DAILY   pravastatin (PRAVACHOL) 40 MG tablet TAKE 1 TABLET BY MOUTH ONCE DAILY IN THE EVENING   Sodium Sulfate-Mag Sulfate-KCl (SUTAB) 1236 373 1208MG TABS Take by mouth as needed.    warfarin (COUMADIN) 5 MG tablet TAKE 1 & 1/2 (ONE & ONE-HALF) TABLETS BY  MOUTH ONCE DAILY 6 IN THE EVENING   No facility-administered encounter medications on file as of 08/12/2021.    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   found during  hospital stay with pancreatitis,  started coumadin;   last Abd CT in epic 07-29-2020  chronic thrombus portal venous   GERD (gastroesophageal reflux disease)    Heart murmur    History of acute pancreatitis    2016   necrotizing pancreatitis   History of adenomatous polyp of colon    History of DVT of lower extremity    History of GI bleed 08/2019   upper gi bleed due to duodenal ulcer   History of gout yrs ago   Hypertension    followed by pcp   Hypertrophic cardiomyopathy ([place])    followed by cardiology   Internal hemorrhoids    Mixed hyperlipidemia    Normocytic anemia    PAF (paroxysmal atrial fibrillation) ([place]) 04/2016   cardiologist-- dr h. Tamala Julian--  event monitor 07-04-2017 epic, NSR/ PAflutter with occasional RVR/  PACs/ PVCs ;  echo 05-16-2017 epic,  moderate LVH with severe asymptomic septal hypertrophy, G1DD, EF 60-65%,  mild AR, mild LAE   Pancreatic pseudocyst    Peyronie's disease    Prostate cancer [facility]) urologist-- dr wrenn/ oncologist-- dr Tammi Klippel   first dx 08/ 2009 Gleason 3+3 active survelliance;  until bx 07/ 2021  Stage T2b, Gleason 4+3   Right inguinal hernia    Type 2 diabetes mellitus ([place])    followed by pcp---  (11-03-2020 per pt currently not checking blood sugar due to glucose monitor broken)   Wears glasses    Wears partial dentures    upper and lower   Past Surgical History:  Procedure Laterality Date   CHOLECYSTECTOMY N/A 11/26/2015   Procedure: LAPAROSCOPIC CHOLECYSTECTOMY;  Surgeon: Aviva Signs, MD;  Location: [facility];  Service: General;  Laterality: N/A;   COLONOSCOPY  last one 09-06-2020  dr stark   CYSTOSCOPY N/A 11/09/2020   Procedure: Erlene Quan;  Surgeon: Irine Seal, MD;  Location: [facility];  Service: Urology;  Laterality: N/A;   ESOPHAGOGASTRODUODENOSCOPY  10-06-2019  _0    FLEXIBLE BRONCHOSCOPY Bilateral 04/14/2016   Procedure: FLEXIBLE BRONCHOSCOPY;  Surgeon: Sinda Du, MD;  Location: [facility];   Service: Cardiopulmonary;  Laterality: Bilateral;   HEMORRHOID SURGERY  yrs ago   RADIOACTIVE SEED IMPLANT N/A 11/09/2020   Procedure: RADIOACTIVE SEED IMPLANT/BRACHYTHERAPY IMPLANT;  Surgeon: Irine Seal, MD;  Location[facility];  Service: Urology;  Laterality: N/A;   SPACE OAR INSTILLATION N/A 11/09/2020   Procedure: SPACE OAR INSTILLATION;  Surgeon: Irine Seal, MD;  Location: [facility];  Service: Urology;  Laterality: N/A;   UMBILICAL HERNIA REPAIR N/A 11/26/2015   Procedure: UMBILICAL HERNIORRHAPHY;  Surgeon: Aviva Signs, MD;  Location: [facility];  Service: General;  Laterality: N/A;   Family History  Problem Relation Age of Onset   Stroke Mother    Hypertension Mother    Alzheimer's disease Mother    Hypertension Father    Brain cancer Sister    Breast cancer Sister    Hypertension Sister    Deep vein thrombosis Brother  Hypertension Brother    Colon cancer Neg Hx    Pancreatic disease Neg Hx    Esophageal cancer Neg Hx    Rectal cancer Neg Hx    Stomach cancer Neg Hx    Social History   Socioeconomic History   Marital status: Married    Spouse name: [name]   Number of children: 4   Years of education: Not on file   Highest education level: 10th grade  Occupational History   Occupation: Retired     Fish farm manager: [employer]  Tobacco Use   Smoking status: Former    Years: 5.00    Types: Cigarettes    Quit date: 12/11/1986    Years since quitting: 34.6   Smokeless tobacco: Never  Vaping Use   Vaping Use: Never used  Substance and Sexual Activity   Alcohol use: No   Drug use: Never   Sexual activity: Yes  Other Topics Concern   Not on file  Social History Narrative   Not on file   Social Determinants of Health   Financial Resource Strain: Low Risk    Difficulty of Paying Living Expenses: Not hard at all  Food Insecurity: No Food Insecurity   Worried About Charity fundraiser in the Last Year: Never true   Ward in the  Last Year: Never true  Transportation Needs: No Transportation Needs   Lack of Transportation (Medical): No   Lack of Transportation (Non-Medical): No  Physical Activity: Insufficiently Active   Days of Exercise per Week: 5 days   Minutes of Exercise per Session: 20 min  Stress: No Stress Concern Present   Feeling of Stress : Not at all  Social Connections: Socially Integrated   Frequency of Communication with Friends and Family: Three times a week   Frequency of Social Gatherings with Friends and Family: Three times a week   Attends Religious Services: More than 4 times per year   Active Member of Clubs or Organizations: Yes   Attends Music therapist: More than 4 times per year   Marital Status: Married    Tobacco Counseling Counseling given: Not Answered   Clinical Intake:  Pre-visit preparation completed: Yes  Pain : No/denies pain     BMI - recorded: 26.29 Nutritional Status: BMI 25 -29 Overweight Nutritional Risks: None Diabetes: Yes  How often do you need to have someone help you when you read instructions, pamphlets, or other written materials from your doctor or pharmacy?: 1 - Never  Diabetic?Nutrition Risk Assessment:  Has the patient had any N/V/D within the last 2 months?  No  Does the patient have any non-healing wounds?  No  Has the patient had any unintentional weight loss or weight gain?  No   Diabetes:  Is the patient diabetic?  Yes  If diabetic, was a CBG obtained today?  No  Did the patient bring in their glucometer from home?  No . Phone visit. How often do you monitor your CBG's? Pt states he does not check his blood sugar levels.   Financial Strains and Diabetes Management:  Are you having any financial strains with the device, your supplies or your medication? No .  Does the patient want to be seen by Chronic Care Management for management of their diabetes?  No  Would the patient like to be referred to a Nutritionist or for  Diabetic Management?  No   Diabetic Exams:  Diabetic Eye Exam: Completed 05/30/21. [facility].  Diabetic Foot  Exam: Completed 06/29/21. Pt has been advised about the importance in completing this exam. Pt is due for diabetic foot exam in 2023.   Interpreter Needed?: No  Information entered by :: Randal Buba, LPN   Activities of Daily Living In your present state of health, do you have any difficulty performing the following activities: 08/12/2021 11/09/2020  Hearing? Y N  Vision? N N  Difficulty concentrating or making decisions? N N  Walking or climbing stairs? Y N  Comment Pt states he does but not all the time. -  Dressing or bathing? N N  Doing errands, shopping? N -  Preparing Food and eating ? N -  Using the Toilet? N -  In the past six months, have you accidently leaked urine? Y -  Comment Pt states he does leak urine at times. -  Do you have problems with loss of bowel control? N -  Managing your Medications? N -  Managing your Finances? N -  Housekeeping or managing your Housekeeping? N -  Some recent data might be hidden    Patient Care Team: Dettinger, Fransisca Kaufmann, MD as PCP - General (Family Medicine) Belva Crome, MD as PCP - Cardiology (Cardiology) Gala Romney Cristopher Estimable, MD as Consulting Physician (Gastroenterology) Irine Seal, MD as Attending Physician (Urology) Dickie La, RN (Inactive) as [place] Management Hudy, Delice Bison, RN as Registered Nurse Shea Evans, Norva Riffle, LCSW as [place] Management (Licensed Clinical Social Worker) Lavera Guise, [facility] (Pharmacist)  Indicate any recent Medical Services you may have received from other than [facility] providers in the past year (date may be approximate).     Assessment:   This is a routine wellness examination for Alan [name].  Hearing/Vision screen Hearing Screening - Comments:: Some hearing loss Vision Screening - Comments:: Glasses. Up to date. Dr. Marin Comment at [place]  issues and exercise activities discussed: Current Exercise Habits: Home exercise routine, Type of exercise: walking, Time (Minutes): 20, Frequency (Times/Week): 5, Weekly Exercise (Minutes/Week): 100, Intensity: Mild, Exercise limited by: cardiac condition(s) (DVT, Afib, Prostate Ca.)   Goals Addressed             This Visit's Progress    Have 3 meals a day       Pt would like to eat a healthier diet.       Depression Screen PHQ 2/9 Scores 08/12/2021 08/08/2021 07/29/2021 07/21/2021 06/29/2021 05/12/2021 05/06/2021  PHQ - 2 Score 0 0 2 0 0 0 0  PHQ- 9 Score - 2 7 - - - 2    Fall Risk Fall Risk  08/12/2021 07/21/2021 06/29/2021 05/12/2021 04/05/2021  Falls in the past year? 0 0 0 0 0  Number falls in past yr: 0 - - - -  Injury with Fall? 0 - - - -  Comment - - - - -  Risk for fall due to : Impaired vision;Other (Comment) - - - -  Risk for fall due to: Comment HTN, DVT, Afib. - - - -  Follow up Falls prevention discussed - - - -    FALL RISK PREVENTION PERTAINING TO THE HOME:  Any stairs in or around the home? No  If so, are there any without handrails? No  Home free of loose throw rugs in walkways, pet beds, electrical cords, etc? Yes  Adequate lighting in your home to reduce risk of falls? Yes   ASSISTIVE DEVICES UTILIZED TO PREVENT FALLS:  Life alert? No  Use  of a cane, walker or w/c? No  Grab bars in the bathroom? Yes  Shower chair or bench in shower? No  Elevated toilet seat or a handicapped toilet? Yes   TIMED UP AND GO:  Was the test performed? No . Phone visit.  Cognitive Function: MMSE - Mini Mental State Exam 03/04/2018 01/03/2017 12/16/2015 12/16/2015  Orientation to time _0 Orientation to Place _1 Registration _2 Attention/ Calculation _3 Recall _4 Language- name 2 objects _5 Language- repeat _6 Language- follow 3 step command _7 Language- read & follow direction _8 -  Write a sentence _9 -  Copy design _10 -   Total score _11 -     6CIT Screen 08/12/2021 04/09/2020 04/09/2019  What Year? 0 points 0 points 0 points  What month? 0 points 3 points 0 points  What time? 0 points 0 points 0 points  Count back from 20 0 points 0 points 0 points  Months in reverse 0 points 2 points 0 points  Repeat phrase 4 points 0 points 0 points  Total Score 4 5 0    Immunizations Immunization History  Administered Date(s) Administered   Fluad Quad(high Dose 65+) 09/03/2019, 09/13/2020   Influenza, High Dose Seasonal PF 09/18/2018   Influenza,inj,Quad PF,6+ Mos 10/02/2013, 11/12/2014, 09/08/2015, 08/31/2016, 09/11/2017   Moderna Sars-Covid-2 Vaccination 01/15/2020, 02/16/2020, 10/13/2020   Pneumococcal Conjugate-13 03/08/2015   Pneumococcal Polysaccharide-23 01/10/2013, 09/01/2019   Tdap 01/10/2013    TDAP status: Up to date  Flu Vaccine status: Due, Education has been provided regarding the importance of this vaccine. Advised may receive this vaccine at local pharmacy or Health Dept. Aware to provide a copy of the vaccination record if obtained from local pharmacy or Health Dept. Verbalized acceptance and understanding.  Pneumococcal vaccine status: Up to date  Covid-19 vaccine status: Completed vaccines  Qualifies for Shingles Vaccine? Yes   Zostavax completed No   Shingrix Completed?: No.    Education has been provided regarding the importance of this vaccine. Patient has been advised to call insurance company to determine out of pocket expense if they have not yet received this vaccine. Advised may also receive vaccine at local pharmacy or Health Dept. Verbalized acceptance and understanding.  Screening Tests Health Maintenance  Topic Date Due   Hepatitis C Screening  Never done   Zoster Vaccines- Shingrix (1 of 2) Never done   COVID-19 Vaccine (4 - Booster for Moderna series) 01/13/2021   INFLUENZA VACCINE  07/11/2021   HEMOGLOBIN A1C  12/30/2021   OPHTHALMOLOGY EXAM  05/30/2022   FOOT EXAM   06/29/2022   TETANUS/TDAP  01/10/2023   COLONOSCOPY (Pts 45-39yr Insurance coverage will need to be confirmed)  09/06/2025   PNA vac Low Risk Adult  Completed   HPV VACCINES  Aged Out    Health Maintenance  Health Maintenance Due  Topic Date Due   Hepatitis C Screening  Never done   Zoster Vaccines- Shingrix (1 of 2) Never done   COVID-19 Vaccine (4 - Booster for Moderna series) 01/13/2021   INFLUENZA VACCINE  07/11/2021    Colorectal cancer screening: Type of screening: Colonoscopy. Completed 09/06/20. Repeat every 5 years  Lung Cancer Screening: (Low Dose CT Chest recommended if Age 78-80years, 30 pack-year currently smoking OR have quit  w/in 15years.) does not qualify.    Additional Screening:  Hepatitis C Screening: does not qualify  Vision Screening: Recommended annual ophthalmology exams for early detection of glaucoma and other disorders of the eye. Is the patient up to date with their annual eye exam?  Yes  Who is the provider or what is the name of the office in which the patient attends annual eye exams? [facility] If pt is not established with a provider, would they like to be referred to a provider to establish care? No .   Dental Screening: Recommended annual dental exams for proper oral hygiene  Community Resource Referral / Chronic Care Management: CRR required this visit?  No   CCM required this visit?  No      Plan:     I have personally reviewed and noted the following in the patient's chart:   Medical and social history Use of alcohol, tobacco or illicit drugs  Current medications and supplements including opioid prescriptions. Patient is not currently taking opioid prescriptions. Functional ability and status Nutritional status Physical activity Advanced directives List of other physicians Hospitalizations, surgeries, and ER visits in previous 12 months Vitals Screenings to include cognitive, depression, and falls Referrals and  appointments  In addition, I have reviewed and discussed with patient certain preventive protocols, quality metrics, and best practice recommendations. A written personalized care plan for preventive services as well as general preventive health recommendations were provided to patient.     Chriss Driver, LPN   03/18/8890   Nurse Notes: None.

## 2021-08-18 ENCOUNTER — Other Ambulatory Visit: Payer: Self-pay | Admitting: Family Medicine

## 2021-08-18 DIAGNOSIS — E1169 Type 2 diabetes mellitus with other specified complication: Secondary | ICD-10-CM

## 2021-09-08 ENCOUNTER — Ambulatory Visit (INDEPENDENT_AMBULATORY_CARE_PROVIDER_SITE_OTHER): Payer: Medicare Other | Admitting: Licensed Clinical Social Worker

## 2021-09-08 DIAGNOSIS — Z86718 Personal history of other venous thrombosis and embolism: Secondary | ICD-10-CM

## 2021-09-08 DIAGNOSIS — I152 Hypertension secondary to endocrine disorders: Secondary | ICD-10-CM

## 2021-09-08 DIAGNOSIS — K219 Gastro-esophageal reflux disease without esophagitis: Secondary | ICD-10-CM

## 2021-09-08 DIAGNOSIS — E119 Type 2 diabetes mellitus without complications: Secondary | ICD-10-CM

## 2021-09-08 DIAGNOSIS — C61 Malignant neoplasm of prostate: Secondary | ICD-10-CM

## 2021-09-08 DIAGNOSIS — M109 Gout, unspecified: Secondary | ICD-10-CM

## 2021-09-08 DIAGNOSIS — E1169 Type 2 diabetes mellitus with other specified complication: Secondary | ICD-10-CM

## 2021-09-08 DIAGNOSIS — E1159 Type 2 diabetes mellitus with other circulatory complications: Secondary | ICD-10-CM

## 2021-09-08 DIAGNOSIS — I480 Paroxysmal atrial fibrillation: Secondary | ICD-10-CM

## 2021-09-08 NOTE — Patient Instructions (Signed)
Visit Information  PATIENT GOALS:  Goals Addressed             This Visit's Progress    Protect My Health;Complete ADLs daily, as able       Timeframe:  Short-Term Goal Priority:  Medium Progress: On Track Start Date:             07/29/21                Expected End Date:           11/24/21            Follow Up Date 10/25/21 at 2:00 PM   Protect My Health (Patient)  Complete ADLs daily as able    Why is this important?   Screening tests can find diseases early when they are easier to treat.  Your doctor or nurse will talk with you about which tests are important for you.  Getting shots for common diseases like the flu and shingles will help prevent them.     Patient Coping Skills: Takes medications as prescribed Attends scheduled medical appointments  Patient Deficits: Mobility issues Difficulty in completing some daily ADLs  Patient Goals:  In next 30 days, patient will: Contact RNCM or LCSW as needed for CCM support Attend scheduled medical appointments Communicate regularly with spouse, Arville Go, about current needs of client -  Follow Up Plan: LCSW to call client or Quintan Saldivar on 10/25/21 at 2:00 PM to assess client needs at that time.     Norva Riffle.Charlane Westry MSW, LCSW Licensed Clinical Social Worker [facility] Care Management [phone]

## 2021-09-08 NOTE — Chronic Care Management (AMB) (Signed)
Chronic Care Management    Clinical Social Work Note  09/08/2021 Name: Ezel Vallone. MRN: [id number] DOB: [birth year]  Davi Kroon. is a 80 y.o. year old male who is a primary care patient of Dettinger, Fransisca Kaufmann, MD. The CCM team was consulted to assist the patient with chronic disease management and/or care coordination needs related to: [employer] .   Engaged with patient /spouse of patient, Nikolas Casher, by telephone for follow up visit in response to provider referral for social work chronic care management and care coordination services.   Consent to Services:  The patient was given information about Chronic Care Management services, agreed to services, and gave verbal consent prior to initiation of services.  Please see initial visit note for detailed documentation.   Patient agreed to services and consent obtained.   Assessment: Review of patient past medical history, allergies, medications, and health status, including review of relevant consultants reports was performed today as part of a comprehensive evaluation and provision of chronic care management and care coordination services.     SDOH (Social Determinants of Health) assessments and interventions performed:  SDOH Interventions    Flowsheet Row Most Recent Value  SDOH Interventions   Stress Interventions Provide Counseling  [client has some stress related to managing his medical conditions]  Depression Interventions/Treatment  --  [informed [name], spouse of client, of LCSW support for client and of RNCM support for client]        Advanced Directives Status: See Vynca application for related entries.  CCM Care Plan  Allergies  Allergen Reactions   Allopurinol Other (See Comments)    Significantly decreased WBC's   Colchicine Other (See Comments)    Significantly decreased WBC's    Outpatient Encounter Medications as of 09/08/2021  Medication Sig   acetaminophen (TYLENOL) 500 MG tablet Take 500  mg by mouth every 6 (six) hours as needed.   amLODipine (NORVASC) 10 MG tablet Take 1 tablet (10 mg total) by mouth every evening.   Blood Glucose Monitoring Suppl (ONE TOUCH ULTRA MINI) w/Device KIT 1 each by Does not apply route 2 (two) times daily.   chlorthalidone (HYGROTON) 25 MG tablet Take 1 tablet by mouth once daily   Ferrous Sulfate (IRON) 325 (65 Fe) MG TABS Take 1 tablet by mouth once daily with breakfast   finasteride (PROSCAR) 5 MG tablet Take 1 tablet (5 mg total) by mouth daily.   fluticasone (FLONASE) 50 MCG/ACT nasal spray Place 1 spray into both nostrils 2 (two) times daily as needed for allergies or rhinitis.   glucose blood (ONE TOUCH ULTRA TEST) test strip Use to check BG once daily.  Dx:  Type 2 DM controlled E11.9   lisinopril (ZESTRIL) 20 MG tablet Take 1.5 tablets (30 mg total) by mouth daily.   metFORMIN (GLUCOPHAGE) 1000 MG tablet TAKE 1 TABLET BY MOUTH TWICE DAILY WITH A MEAL   metoprolol succinate (TOPROL-XL) 100 MG 24 hr tablet Take 1 tablet (100 mg total) by mouth daily. Take with or immediately following a meal.   Multiple Vitamins-Minerals (CENTRUM SILVER 50+MEN) TABS Take by mouth daily.   omeprazole (PRILOSEC) 20 MG capsule Take 1 capsule (20 mg total) by mouth daily.   potassium chloride SA (KLOR-CON) 20 MEQ tablet TAKE 1  BY MOUTH ONCE DAILY   pravastatin (PRAVACHOL) 40 MG tablet TAKE 1 TABLET BY MOUTH ONCE DAILY IN THE EVENING   Sodium Sulfate-Mag Sulfate-KCl (SUTAB) [phone] MG TABS Take by mouth as needed.  warfarin (COUMADIN) 5 MG tablet TAKE 1 & 1/2 (ONE & ONE-HALF) TABLETS BY MOUTH ONCE DAILY 6 IN THE EVENING   No facility-administered encounter medications on file as of 09/08/2021.    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 06/06/2016   Paroxysmal atrial fibrillation ([place]) 04/19/2016   Hypertension associated with diabetes ([place]) 04/09/2016   Type 2 diabetes mellitus without complications ([place]) 54/98/2641   GERD (gastroesophageal reflux disease) 01/24/2016   ED (erectile dysfunction)    Prostate cancer ([place])    Hyperlipidemia associated with type 2 diabetes mellitus ([place]) 04/29/2013   Gout 04/29/2013    Conditions to be addressed/monitored: monitor client completion of ADLs  Care Plan : LCSW care plan  Updates made by Katha Cabal, LCSW since 09/08/2021 12:00 AM     Problem: Coping Skills (General Plan of Care)      Goal: Coping Skills Enhanced;Complete ADLs daily, as able   Start Date: 07/29/2021  Expected End Date: 11/24/2021  This Visit's Progress: On track  Recent Progress: Not on track  Priority: Medium  Note:   Current barriers:   Patient in need of assistance with connecting to community resources for possible help with completing ADLs daily  Patient is unable to independently navigate community resource options without care coordination support Mobility issues Pain issues  Clinical Goals:  patient will work with SW monthly to address concerns related to ADLs completion of client  Client will work with SW monthly to address mobility issues of client  Clinical Interventions:  Collaboration with Dettinger, Fransisca Kaufmann, MD regarding development and update of comprehensive plan of care as evidenced by provider attestation and co-signature Talked with [name], spouse of client about  client needs Talked with Arville Go about appetite of client Talked with Arville Go about sleeping issues of client Talked with Arville Go about about walking of client. Arville Go said client was walking better  at present. She said he has been doing more activities outside in recent days. Talked with Arville Go about pain issues of client Talked with Arville Go about upcoming medical appointments for client Talked with Arville Go about client completion of ADLs Talked with Arville Go about client socialization. She said client enjoys going to church and socializes with friends at church Talked with Arville Go about energy of client. She said that she thinks his energy level has improved.   Patient Coping Skills: Takes medications as prescribed Attends scheduled medical appointments  Patient Deficits: Mobility issues Difficulty in completing some daily ADLs  Patient Goals:  In next 30 days, patient will: Contact RNCM or LCSW as needed for CCM support Attend scheduled medical appointments Communicate regularly with spouse, Arville Go, about current needs of client -  Follow Up Plan: LCSW to call client or Rishan Oyama on 10/25/21 at 2:00 PM to assess needs of client at that time      Norva Riffle.Ahniyah Giancola MSW, LCSW Licensed Clinical Social Worker [facility] Care Management [phone]

## 2021-09-09 DIAGNOSIS — I480 Paroxysmal atrial fibrillation: Secondary | ICD-10-CM | POA: Diagnosis not present

## 2021-09-09 DIAGNOSIS — E785 Hyperlipidemia, unspecified: Secondary | ICD-10-CM

## 2021-09-09 DIAGNOSIS — I152 Hypertension secondary to endocrine disorders: Secondary | ICD-10-CM

## 2021-09-09 DIAGNOSIS — C61 Malignant neoplasm of prostate: Secondary | ICD-10-CM | POA: Diagnosis not present

## 2021-09-09 DIAGNOSIS — E119 Type 2 diabetes mellitus without complications: Secondary | ICD-10-CM

## 2021-09-09 DIAGNOSIS — E1159 Type 2 diabetes mellitus with other circulatory complications: Secondary | ICD-10-CM | POA: Diagnosis not present

## 2021-09-09 DIAGNOSIS — E1169 Type 2 diabetes mellitus with other specified complication: Secondary | ICD-10-CM

## 2021-09-21 ENCOUNTER — Other Ambulatory Visit: Payer: Self-pay

## 2021-09-21 ENCOUNTER — Encounter: Payer: Self-pay | Admitting: Family Medicine

## 2021-09-21 ENCOUNTER — Ambulatory Visit (INDEPENDENT_AMBULATORY_CARE_PROVIDER_SITE_OTHER): Payer: Medicare Other | Admitting: Family Medicine

## 2021-09-21 VITALS — BP 138/60 | HR 58 | Ht 65.0 in | Wt 168.0 lb

## 2021-09-21 DIAGNOSIS — Z7901 Long term (current) use of anticoagulants: Secondary | ICD-10-CM | POA: Diagnosis not present

## 2021-09-21 DIAGNOSIS — E1169 Type 2 diabetes mellitus with other specified complication: Secondary | ICD-10-CM | POA: Diagnosis not present

## 2021-09-21 DIAGNOSIS — E785 Hyperlipidemia, unspecified: Secondary | ICD-10-CM

## 2021-09-21 DIAGNOSIS — E1159 Type 2 diabetes mellitus with other circulatory complications: Secondary | ICD-10-CM | POA: Diagnosis not present

## 2021-09-21 DIAGNOSIS — Z23 Encounter for immunization: Secondary | ICD-10-CM | POA: Diagnosis not present

## 2021-09-21 DIAGNOSIS — Z86718 Personal history of other venous thrombosis and embolism: Secondary | ICD-10-CM

## 2021-09-21 DIAGNOSIS — I152 Hypertension secondary to endocrine disorders: Secondary | ICD-10-CM

## 2021-09-21 DIAGNOSIS — E119 Type 2 diabetes mellitus without complications: Secondary | ICD-10-CM

## 2021-09-21 DIAGNOSIS — D6859 Other primary thrombophilia: Secondary | ICD-10-CM | POA: Diagnosis not present

## 2021-09-21 LAB — COAGUCHEK XS/INR WAIVED
INR: 2.1 — ABNORMAL HIGH (ref 0.9–1.1)
Prothrombin Time: 25.6 s

## 2021-09-21 LAB — BAYER DCA HB A1C WAIVED: HB A1C (BAYER DCA - WAIVED): 6.4 % — ABNORMAL HIGH (ref 4.8–5.6)

## 2021-09-21 MED ORDER — METFORMIN HCL 1000 MG PO TABS: 1000.0000 mg | ORAL_TABLET | Freq: Two times a day (BID) | ORAL | 3 refills | Status: DC

## 2021-09-21 MED ORDER — WARFARIN SODIUM 5 MG PO TABS: ORAL_TABLET | ORAL | 3 refills | Status: DC

## 2021-09-21 MED ORDER — PRAVASTATIN SODIUM 40 MG PO TABS: 40.0000 mg | ORAL_TABLET | Freq: Every evening | ORAL | 3 refills | Status: DC

## 2021-09-21 NOTE — Progress Notes (Signed)
BP 138/60   Pulse (!) 58   Ht '5\' 5"'  (1.651 m)   Wt 168 lb (76.2 kg)   SpO2 99%   BMI 27.96 kg/m    Subjective:   Patient ID: Alan Netters., male    DOB: 1941-01-01, 80 y.o.   MRN: [id number]  HPI: Alan Brandis. is a 80 y.o. male presenting on 09/21/2021 for Medical Management of Chronic Issues, Atrial Fibrillation, and Diabetes   HPI Type 2 diabetes mellitus Patient comes in today for recheck of his diabetes. Patient has been currently taking metformin, A1c today 6.4. Patient is currently on an ACE inhibitor/ARB. Patient has seen an ophthalmologist this year. Patient denies any issues with their feet. The symptom started onset as an adult hypertension and hyperlipidemia ARE RELATED TO DM   Hypertension Patient is currently on amlodipine and chlorthalidone and lisinopril and metoprolol, and their blood pressure today is 163/64, 138/60 on repeat. Patient denies any lightheadedness or dizziness. Patient denies headaches, blurred vision, chest pains, shortness of breath, or weakness. Denies any side effects from medication and is content with current medication.   Hyperlipidemia Patient is coming in for recheck of his hyperlipidemia. The patient is currently taking pravastatin. They deny any issues with myalgias or history of liver damage from it. They deny any focal numbness or weakness or chest pain.   Coumadin recheck Target goal: 2.0-3.0 Reason on anticoagulation: A. fib with history of DVTs Patient denies any bruising or bleeding or chest pain or palpitations   Relevant past medical, surgical, family and social history reviewed and updated as indicated. Interim medical history since our last visit reviewed. Allergies and medications reviewed and updated.  Review of Systems  Constitutional:  Negative for chills and fever.  Eyes:  Negative for visual disturbance.  Respiratory:  Negative for shortness of breath and wheezing.   Cardiovascular:  Negative for chest pain and leg  swelling.  Musculoskeletal:  Negative for back pain and gait problem.  Skin:  Negative for rash.  Neurological:  Negative for dizziness, weakness and light-headedness.  All other systems reviewed and are negative.  Per HPI unless specifically indicated above   Allergies as of 09/21/2021       Reactions   Allopurinol Other (See Comments)   Significantly decreased WBC's   Colchicine Other (See Comments)   Significantly decreased WBC's        Medication List        Accurate as of September 21, 2021 11:44 AM. If you have any questions, ask your nurse or doctor.          acetaminophen 500 MG tablet Commonly known as: TYLENOL Take 500 mg by mouth every 6 (six) hours as needed.   amLODipine 10 MG tablet Commonly known as: NORVASC Take 1 tablet (10 mg total) by mouth every evening.   Centrum Silver 50+Men Tabs Take by mouth daily.   chlorthalidone 25 MG tablet Commonly known as: HYGROTON Take 1 tablet by mouth once daily   finasteride 5 MG tablet Commonly known as: PROSCAR Take 1 tablet (5 mg total) by mouth daily.   fluticasone 50 MCG/ACT nasal spray Commonly known as: FLONASE Place 1 spray into both nostrils 2 (two) times daily as needed for allergies or rhinitis.   glucose blood test strip Commonly known as: ONE TOUCH ULTRA TEST Use to check BG once daily.  Dx:  Type 2 DM controlled E11.9   Iron 325 (65 Fe) MG Tabs Take 1 tablet by mouth once  daily with breakfast   lisinopril 20 MG tablet Commonly known as: ZESTRIL Take 1.5 tablets (30 mg total) by mouth daily.   metFORMIN 1000 MG tablet Commonly known as: GLUCOPHAGE Take 1 tablet (1,000 mg total) by mouth 2 (two) times daily with a meal.   metoprolol succinate 100 MG 24 hr tablet Commonly known as: TOPROL-XL Take 1 tablet (100 mg total) by mouth daily. Take with or immediately following a meal.   omeprazole 20 MG capsule Commonly known as: PRILOSEC Take 1 capsule (20 mg total) by mouth daily.    ONE TOUCH ULTRA MINI w/Device Kit 1 each by Does not apply route 2 (two) times daily.   potassium chloride SA 20 MEQ tablet Commonly known as: KLOR-CON TAKE 1  BY MOUTH ONCE DAILY   pravastatin 40 MG tablet Commonly known as: PRAVACHOL Take 1 tablet (40 mg total) by mouth every evening.   Sutab [phone] MG Tabs Generic drug: Sodium Sulfate-Mag Sulfate-KCl Take by mouth as needed.   warfarin 5 MG tablet Commonly known as: COUMADIN Take as directed by the anticoagulation clinic. If you are unsure how to take this medication, talk to your nurse or doctor. Original instructions: TAKE 1 & 1/2 (ONE & ONE-HALF) TABLETS BY MOUTH ONCE DAILY 6 IN THE EVENING         Objective:   BP 138/60   Pulse (!) 58   Ht '5\' 5"'  (1.651 m)   Wt 168 lb (76.2 kg)   SpO2 99%   BMI 27.96 kg/m   Wt Readings from Last 3 Encounters:  09/21/21 168 lb (76.2 kg)  08/12/21 158 lb (71.7 kg)  08/08/21 158 lb 9.6 oz (71.9 kg)    Physical Exam Vitals and nursing note reviewed.  Constitutional:      General: He is not in acute distress.    Appearance: He is well-developed. He is not diaphoretic.  Eyes:     General: No scleral icterus.    Conjunctiva/sclera: Conjunctivae normal.  Neck:     Thyroid: No thyromegaly.  Cardiovascular:     Rate and Rhythm: Normal rate. Rhythm irregular.     Heart sounds: Normal heart sounds. No murmur heard. Pulmonary:     Effort: Pulmonary effort is normal. No respiratory distress.     Breath sounds: Normal breath sounds. No wheezing.  Musculoskeletal:        General: Normal range of motion.     Cervical back: Neck supple.  Lymphadenopathy:     Cervical: No cervical adenopathy.  Skin:    General: Skin is warm and dry.     Findings: No rash.  Neurological:     Mental Status: He is alert and oriented to person, place, and time.     Coordination: Coordination normal.  Psychiatric:        Behavior: Behavior normal.    Results for orders placed or performed  in visit on 09/21/21  CoaguChek XS/INR Waived  Result Value Ref Range   INR 2.1 (H) 0.9 - 1.1   Prothrombin Time 25.6 sec    Assessment & Plan:   Problem List Items Addressed This Visit       Cardiovascular and Mediastinum   Hypertension associated with diabetes ([place])   Relevant Medications   metFORMIN (GLUCOPHAGE) 1000 MG tablet   pravastatin (PRAVACHOL) 40 MG tablet   warfarin (COUMADIN) 5 MG tablet   Other Relevant Orders   CBC with Differential/Platelet   CMP14+EGFR   Lipid panel   Bayer DCA Hb A1c  Waived   CoaguChek XS/INR Waived (Completed)     Endocrine   Hyperlipidemia associated with type 2 diabetes mellitus (HCC) - Primary   Relevant Medications   metFORMIN (GLUCOPHAGE) 1000 MG tablet   pravastatin (PRAVACHOL) 40 MG tablet   warfarin (COUMADIN) 5 MG tablet   Other Relevant Orders   CBC with Differential/Platelet   CMP14+EGFR   Lipid panel   Bayer DCA Hb A1c Waived   CoaguChek XS/INR Waived (Completed)   Type 2 diabetes mellitus without complications (HCC)   Relevant Medications   metFORMIN (GLUCOPHAGE) 1000 MG tablet   pravastatin (PRAVACHOL) 40 MG tablet   Other Relevant Orders   CBC with Differential/Platelet   CMP14+EGFR   Lipid panel   Bayer DCA Hb A1c Waived   CoaguChek XS/INR Waived (Completed)     Hematopoietic and Hemostatic   Primary hypercoagulable state ([place]) [D68.59]   Relevant Medications   warfarin (COUMADIN) 5 MG tablet     Other   Long term (current) use of anticoagulants [Z79.01]   Relevant Orders   CBC with Differential/Platelet   CMP14+EGFR   Lipid panel   Bayer DCA Hb A1c Waived   CoaguChek XS/INR Waived (Completed)   History of DVT (deep vein thrombosis)   Other Visit Diagnoses     Type 2 diabetes mellitus with other specified complication, without long-term current use of insulin (HCC)       Relevant Medications   metFORMIN (GLUCOPHAGE) 1000 MG tablet   pravastatin (PRAVACHOL) 40 MG tablet       Description     continue current dose to take 1 tablet every day of the week except for Mondays, and Saturdays take 1 1/2 tablets  INR was 2.2 (goal 2-3)   Follow-up 4-6 weeks    Continue current medicine, blood pressure looks better on recheck.  No changes, follow-up in 6 weeks for INR.  Follow up plan: Return if symptoms worsen or fail to improve, for 6-week INR.  Counseling provided for all of the vaccine components Orders Placed This Encounter  Procedures   CBC with Differential/Platelet   CMP14+EGFR   Lipid panel   Bayer DCA Hb A1c Waived   CoaguChek XS/INR [place], MD [facility] 09/21/2021, 11:44 AM

## 2021-09-22 LAB — CBC WITH DIFFERENTIAL/PLATELET
Basophils Absolute: 0 10*3/uL (ref 0.0–0.2)
Basos: 0 %
EOS (ABSOLUTE): 0.1 10*3/uL (ref 0.0–0.4)
Eos: 1 %
Hematocrit: 32.9 % — ABNORMAL LOW (ref 37.5–51.0)
Hemoglobin: 10.4 g/dL — ABNORMAL LOW (ref 13.0–17.7)
Immature Grans (Abs): 0 10*3/uL (ref 0.0–0.1)
Immature Granulocytes: 0 %
Lymphocytes Absolute: 1 10*3/uL (ref 0.7–3.1)
Lymphs: 12 %
MCH: 28.4 pg (ref 26.6–33.0)
MCHC: 31.6 g/dL (ref 31.5–35.7)
MCV: 90 fL (ref 79–97)
Monocytes Absolute: 0.7 10*3/uL (ref 0.1–0.9)
Monocytes: 8 %
Neutrophils Absolute: 6.7 10*3/uL (ref 1.4–7.0)
Neutrophils: 79 %
Platelets: 233 10*3/uL (ref 150–450)
RBC: 3.66 x10E6/uL — ABNORMAL LOW (ref 4.14–5.80)
RDW: 14.5 % (ref 11.6–15.4)
WBC: 8.5 10*3/uL (ref 3.4–10.8)

## 2021-09-22 LAB — CMP14+EGFR
ALT: 8 IU/L (ref 0–44)
AST: 10 IU/L (ref 0–40)
Albumin/Globulin Ratio: 1.4 (ref 1.2–2.2)
Albumin: 3.7 g/dL (ref 3.7–4.7)
Alkaline Phosphatase: 105 IU/L (ref 44–121)
BUN/Creatinine Ratio: 16 (ref 10–24)
BUN: 19 mg/dL (ref 8–27)
Bilirubin Total: 0.8 mg/dL (ref 0.0–1.2)
CO2: 21 mmol/L (ref 20–29)
Calcium: 9.7 mg/dL (ref 8.6–10.2)
Chloride: 98 mmol/L (ref 96–106)
Creatinine, Ser: 1.19 mg/dL (ref 0.76–1.27)
Globulin, Total: 2.6 g/dL (ref 1.5–4.5)
Glucose: 117 mg/dL — ABNORMAL HIGH (ref 70–99)
Potassium: 3.5 mmol/L (ref 3.5–5.2)
Sodium: 139 mmol/L (ref 134–144)
Total Protein: 6.3 g/dL (ref 6.0–8.5)
eGFR: 63 mL/min/{1.73_m2} (ref >59)

## 2021-09-22 LAB — LIPID PANEL
Chol/HDL Ratio: 3.5 ratio (ref 0.0–5.0)
Cholesterol, Total: 83 mg/dL — ABNORMAL LOW (ref 100–199)
HDL: 24 mg/dL — ABNORMAL LOW (ref >39)
LDL Chol Calc (NIH): 30 mg/dL (ref 0–99)
Triglycerides: 173 mg/dL — ABNORMAL HIGH (ref 0–149)
VLDL Cholesterol Cal: 29 mg/dL (ref 5–40)

## 2021-10-03 ENCOUNTER — Other Ambulatory Visit: Payer: Self-pay | Admitting: Family Medicine

## 2021-10-03 DIAGNOSIS — E1169 Type 2 diabetes mellitus with other specified complication: Secondary | ICD-10-CM

## 2021-10-25 ENCOUNTER — Telehealth: Payer: Medicare Other

## 2021-10-25 ENCOUNTER — Ambulatory Visit (INDEPENDENT_AMBULATORY_CARE_PROVIDER_SITE_OTHER): Payer: Medicare Other | Admitting: Licensed Clinical Social Worker

## 2021-10-25 DIAGNOSIS — E119 Type 2 diabetes mellitus without complications: Secondary | ICD-10-CM

## 2021-10-25 DIAGNOSIS — E1169 Type 2 diabetes mellitus with other specified complication: Secondary | ICD-10-CM

## 2021-10-25 DIAGNOSIS — E1159 Type 2 diabetes mellitus with other circulatory complications: Secondary | ICD-10-CM

## 2021-10-25 DIAGNOSIS — C61 Malignant neoplasm of prostate: Secondary | ICD-10-CM

## 2021-10-25 DIAGNOSIS — Z86718 Personal history of other venous thrombosis and embolism: Secondary | ICD-10-CM

## 2021-10-25 DIAGNOSIS — M109 Gout, unspecified: Secondary | ICD-10-CM

## 2021-10-25 DIAGNOSIS — K219 Gastro-esophageal reflux disease without esophagitis: Secondary | ICD-10-CM

## 2021-10-25 DIAGNOSIS — I480 Paroxysmal atrial fibrillation: Secondary | ICD-10-CM

## 2021-10-25 NOTE — Patient Instructions (Addendum)
Visit Information  Patient Goals:  Protect My Health (Patient). Complete ADLs daily as able  Timeframe:  Short-Term Goal Priority:  Medium Progress: On Track Start Date:            10/25/21                Expected End Date:          01/23/22            Follow Up Date 12/23/21 at 1:30 PM    Protect My Health (Patient)  Complete ADLs daily as able    Why is this important?   Screening tests can find diseases early when they are easier to treat.  Your doctor or nurse will talk with you about which tests are important for you.  Getting shots for common diseases like the flu and shingles will help prevent them.     Patient Coping Skills: Takes medications as prescribed Attends scheduled medical appointments  Patient Deficits: Mobility issues Difficulty in completing some daily ADLs  Patient Goals:  In next 30 days, patient will: Contact RNCM or LCSW as needed for CCM support Attend scheduled medical appointments Communicate regularly with spouse, Arville Go, about current needs of client -  Follow Up Plan: LCSW to call client or Hafiz Irion on 12/23/21 at 1:30 PM to assess client needs at that time.   Norva Riffle.Amberlin Utke MSW, LCSW Licensed Clinical Social Worker [facility] Care Management [phone]

## 2021-10-25 NOTE — Chronic Care Management (AMB) (Signed)
Chronic Care Management    Clinical Social Work Note  10/25/2021 Name: Alan [name]. MRN: [id number] DOB: [birth year]  Alan [name]. is a 80 y.o. year old male who is a primary care patient of Dettinger, Fransisca Kaufmann, MD. The CCM team was consulted to assist the patient with chronic disease management and/or care coordination needs related to: [employer] .   Engaged with patient /spouse of patient, Alan [name], by telephone for follow up visit in response to provider referral for social work chronic care management and care coordination services.   Consent to Services:  The patient was given information about Chronic Care Management services, agreed to services, and gave verbal consent prior to initiation of services.  Please see initial visit note for detailed documentation.   Patient agreed to services and consent obtained.   Assessment: Review of patient past medical history, allergies, medications, and health status, including review of relevant consultants reports was performed today as part of a comprehensive evaluation and provision of chronic care management and care coordination services.     SDOH (Social Determinants of Health) assessments and interventions performed:  SDOH Interventions    Flowsheet Row Most Recent Value  SDOH Interventions   Physical Activity Interventions Other (Comments)  [client ambulates slowly. He gets fatigued occasionally in walking]  Stress Interventions Provide Counseling  [client has some stress related to managing his medical needs]  Depression Interventions/Treatment  --  [informed Alan [name], spouse of client, about LCSW support for client and about RNCM support for client]        Advanced Directives Status: See Vynca application for related entries.  CCM Care Plan  Allergies  Allergen Reactions   Allopurinol Other (See Comments)    Significantly decreased WBC's   Colchicine Other (See Comments)    Significantly decreased WBC's     Outpatient Encounter Medications as of 10/25/2021  Medication Sig   acetaminophen (TYLENOL) 500 MG tablet Take 500 mg by mouth every 6 (six) hours as needed.   amLODipine (NORVASC) 10 MG tablet Take 1 tablet (10 mg total) by mouth every evening.   Blood Glucose Monitoring Suppl (ONE TOUCH ULTRA MINI) w/Device KIT 1 each by Does not apply route 2 (two) times daily.   chlorthalidone (HYGROTON) 25 MG tablet Take 1 tablet by mouth once daily   finasteride (PROSCAR) 5 MG tablet Take 1 tablet (5 mg total) by mouth daily.   fluticasone (FLONASE) 50 MCG/ACT nasal spray Place 1 spray into both nostrils 2 (two) times daily as needed for allergies or rhinitis.   glucose blood (ONE TOUCH ULTRA TEST) test strip Use to check BG once daily.  Dx:  Type 2 DM controlled E11.9   lisinopril (ZESTRIL) 20 MG tablet Take 1.5 tablets (30 mg total) by mouth daily.   metFORMIN (GLUCOPHAGE) 1000 MG tablet Take 1 tablet (1,000 mg total) by mouth 2 (two) times daily with a meal.   metoprolol succinate (TOPROL-XL) 100 MG 24 hr tablet Take 1 tablet (100 mg total) by mouth daily. Take with or immediately following a meal.   Multiple Vitamins-Minerals (CENTRUM SILVER 50+MEN) TABS Take by mouth daily.   omeprazole (PRILOSEC) 20 MG capsule Take 1 capsule (20 mg total) by mouth daily.   potassium chloride SA (KLOR-CON) 20 MEQ tablet TAKE 1  BY MOUTH ONCE DAILY   pravastatin (PRAVACHOL) 40 MG tablet Take 1 tablet (40 mg total) by mouth every evening.   Sodium Sulfate-Mag Sulfate-KCl (SUTAB) [phone] MG TABS Take by mouth as needed.  SV IRON 325 MG tablet Take 1 tablet by mouth once daily with breakfast   warfarin (COUMADIN) 5 MG tablet TAKE 1 & 1/2 (ONE & ONE-HALF) TABLETS BY MOUTH ONCE DAILY 6 IN THE EVENING   No facility-administered encounter medications on file as of 10/25/2021.    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 06/06/2016   Paroxysmal atrial fibrillation ([place]) 04/19/2016   Hypertension associated with diabetes ([place]) 04/09/2016   Type 2 diabetes mellitus without complications ([place]) 35/00/9381   GERD (gastroesophageal reflux disease) 01/24/2016   ED (erectile dysfunction)    Prostate cancer ([place])    Hyperlipidemia associated with type 2 diabetes mellitus ([place]) 04/29/2013   Gout 04/29/2013    Conditions to be addressed/monitored: monitor client completion of ADLs  Care Plan : LCSW care plan  Updates made by Alan Cabal, LCSW since 10/25/2021 12:00 AM     Problem: Coping Skills (General Plan of Care)      Goal: Coping Skills Enhanced;Complete ADLs daily, as able   Start Date: 10/25/2021  Expected End Date: 01/23/2022  This Visit's Progress: On track  Recent Progress: On track  Priority: Medium  Note:   Current barriers:   Patient in need of assistance with connecting to community resources for possible help with completing ADLs daily  Patient is unable to independently navigate community resource options without care coordination support Mobility issues Pain issues  Clinical Goals:  patient will work with SW monthly to address concerns related to ADLs completion of client  Client will work with SW monthly to address mobility issues of client  Clinical Interventions:  Collaboration with Dettinger, Fransisca Kaufmann, MD regarding development and update of comprehensive plan of care as evidenced by provider attestation and co-signature Discussed client needs with Alan [name] Discussed appetite of client and sleeping issues of client.  Reviewed  ambulation of client. Alan [name] said client walks slowly. She said he gets fatigued occasionally in walking and has to tak rest breaks Reviewed pain issues of client Reviewed upcoming client appointments. Discussed client completion of ADLs. Alan [name] said client can complete ADLs on his own.  Reviewed energy of client. Client feels that his energy is at a good level at present Discussed Shortness of Breath of client. Alan [name] said client will occasionally get short of breath with exertion Reviewed hearing needs of client. 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; but, she said he is not using a hearing aid at present Encouraged client or Alan [name] to call RNCM as needed for nursing support for client [facility]ated with [facility] about client needs  Patient Coping Skills: Takes medications as prescribed Attends scheduled medical appointments  Patient Deficits: Mobility issues Difficulty in completing some daily ADLs  Patient Goals:  In next 30 days, patient will: Contact RNCM or LCSW as needed for CCM support Attend scheduled medical appointments Communicate regularly with spouse, Alan [name], about current needs of client -  Follow Up Plan: LCSW to call client or Alan [name] on 12/23/21 at 1:30 PM to assess needs of client at that time      Alan [name] MSW, [employer] Licensed Holiday representative [employer]  Care Management [phone]

## 2021-11-02 ENCOUNTER — Encounter: Payer: Self-pay | Admitting: Family Medicine

## 2021-11-02 ENCOUNTER — Ambulatory Visit (INDEPENDENT_AMBULATORY_CARE_PROVIDER_SITE_OTHER): Payer: Medicare Other | Admitting: Family Medicine

## 2021-11-02 ENCOUNTER — Other Ambulatory Visit: Payer: Self-pay

## 2021-11-02 VITALS — BP 142/57 | HR 46 | Ht 65.0 in | Wt 162.0 lb

## 2021-11-02 DIAGNOSIS — I480 Paroxysmal atrial fibrillation: Secondary | ICD-10-CM | POA: Diagnosis not present

## 2021-11-02 DIAGNOSIS — D6859 Other primary thrombophilia: Secondary | ICD-10-CM

## 2021-11-02 DIAGNOSIS — Z7901 Long term (current) use of anticoagulants: Secondary | ICD-10-CM | POA: Diagnosis not present

## 2021-11-02 DIAGNOSIS — Z86718 Personal history of other venous thrombosis and embolism: Secondary | ICD-10-CM | POA: Diagnosis not present

## 2021-11-02 LAB — COAGUCHEK XS/INR WAIVED
INR: 2.3 — ABNORMAL HIGH (ref 0.9–1.1)
Prothrombin Time: 27.3 s

## 2021-11-02 NOTE — Progress Notes (Signed)
BP (!) 142/57   Pulse (!) 46   Ht 5' 5" (1.651 m)   Wt 162 lb (73.5 kg)   SpO2 100%   BMI 26.96 kg/m    Subjective:   Patient ID: Alan [name]., male    DOB: 1941-05-02, 80 y.o.   MRN: [id number]  HPI: Alan [name]. is a 80 y.o. male presenting on 11/02/2021 for Medical Management of Chronic Issues, Atrial Fibrillation, and Hyperlipidemia   HPI Coumadin recheck Target goal: 2.0-3.0 Reason on anticoagulation: A. fib with history of DVTs Patient denies any bruising or bleeding or chest pain or palpitations   Relevant past medical, surgical, family and social history reviewed and updated as indicated. Interim medical history since our last visit reviewed. Allergies and medications reviewed and updated.  Review of Systems  Constitutional:  Negative for chills and fever.  Respiratory:  Negative for shortness of breath and wheezing.   Cardiovascular:  Negative for chest pain.  Skin:  Negative for rash.  All other systems reviewed and are negative.  Per HPI unless specifically indicated above   Allergies as of 11/02/2021       Reactions   Allopurinol Other (See Comments)   Significantly decreased WBC's   Colchicine Other (See Comments)   Significantly decreased WBC's        Medication List        Accurate as of November 02, 2021 12:01 PM. If you have any questions, ask your nurse or doctor.          acetaminophen 500 MG tablet Commonly known as: TYLENOL Take 500 mg by mouth every 6 (six) hours as needed.   amLODipine 10 MG tablet Commonly known as: NORVASC Take 1 tablet (10 mg total) by mouth every evening.   Centrum Silver 50+Men Tabs Take by mouth daily.   chlorthalidone 25 MG tablet Commonly known as: HYGROTON Take 1 tablet by mouth once daily   finasteride 5 MG tablet Commonly known as: PROSCAR Take 1 tablet (5 mg total) by mouth daily.   fluticasone 50 MCG/ACT nasal spray Commonly known as: FLONASE Place 1 spray into both nostrils 2 (two)  times daily as needed for allergies or rhinitis.   glucose blood test strip Commonly known as: ONE TOUCH ULTRA TEST Use to check BG once daily.  Dx:  Type 2 DM controlled E11.9   lisinopril 20 MG tablet Commonly known as: ZESTRIL Take 1.5 tablets (30 mg total) by mouth daily.   metFORMIN 1000 MG tablet Commonly known as: GLUCOPHAGE Take 1 tablet (1,000 mg total) by mouth 2 (two) times daily with a meal.   metoprolol succinate 100 MG 24 hr tablet Commonly known as: TOPROL-XL Take 1 tablet (100 mg total) by mouth daily. Take with or immediately following a meal.   omeprazole 20 MG capsule Commonly known as: PRILOSEC Take 1 capsule (20 mg total) by mouth daily.   ONE TOUCH ULTRA MINI w/Device Kit 1 each by Does not apply route 2 (two) times daily.   potassium chloride SA 20 MEQ tablet Commonly known as: KLOR-CON TAKE 1  BY MOUTH ONCE DAILY   pravastatin 40 MG tablet Commonly known as: PRAVACHOL Take 1 tablet (40 mg total) by mouth every evening.   Sutab [phone] MG Tabs Generic drug: Sodium Sulfate-Mag Sulfate-KCl Take by mouth as needed.   SV Iron 325 (65 FE) MG tablet Generic drug: ferrous sulfate Take 1 tablet by mouth once daily with breakfast   warfarin 5 MG tablet Commonly known  as: COUMADIN Take as directed by the anticoagulation clinic. If you are unsure how to take this medication, talk to your nurse or doctor. Original instructions: TAKE 1 & 1/2 (ONE & ONE-HALF) TABLETS BY MOUTH ONCE DAILY 6 IN THE EVENING         Objective:   BP (!) 142/57   Pulse (!) 46   Ht 5' 5" (1.651 m)   Wt 162 lb (73.5 kg)   SpO2 100%   BMI 26.96 kg/m   Wt Readings from Last 3 Encounters:  11/02/21 162 lb (73.5 kg)  09/21/21 168 lb (76.2 kg)  08/12/21 158 lb (71.7 kg)    Physical Exam Vitals and nursing note reviewed.  Constitutional:      Appearance: Normal appearance.  Skin:    General: Skin is warm and dry.     Findings: No bruising.  Neurological:      Mental Status: He is alert.    Description    continue current dose to take 1 tablet every day of the week except for Mondays, and Saturdays take 1 1/2 tablets  INR was 2.3 (goal 2-3)   Follow-up 4-6 weeks      Assessment & Plan:   Problem List Items Addressed This Visit       Cardiovascular and Mediastinum   Paroxysmal atrial fibrillation ([place]) - Primary   Relevant Orders   CoaguChek XS/INR Waived     Hematopoietic and Hemostatic   Primary hypercoagulable state ([place]) [D68.59]     Other   Long term (current) use of anticoagulants [Z79.01]   Relevant Orders   CoaguChek XS/INR Waived   History of DVT (deep vein thrombosis)     Follow up plan: Return if symptoms worsen or fail to improve, for 6-week INR recheck.  Counseling provided for all of the vaccine components Orders Placed This Encounter  Procedures   CoaguChek XS/INR [place] , MD [facility] 11/02/2021, 12:01 PM

## 2021-11-10 ENCOUNTER — Other Ambulatory Visit: Payer: Self-pay | Admitting: Family Medicine

## 2021-11-10 DIAGNOSIS — E1159 Type 2 diabetes mellitus with other circulatory complications: Secondary | ICD-10-CM

## 2021-11-17 ENCOUNTER — Other Ambulatory Visit: Payer: Self-pay

## 2021-11-17 ENCOUNTER — Other Ambulatory Visit: Payer: Medicare Other

## 2021-11-18 LAB — PSA: Prostate Specific Ag, Serum: 53.2 ng/mL — ABNORMAL HIGH (ref 0.0–4.0)

## 2021-11-21 ENCOUNTER — Telehealth: Payer: Self-pay

## 2021-11-21 DIAGNOSIS — R972 Elevated prostate specific antigen [PSA]: Secondary | ICD-10-CM

## 2021-11-21 NOTE — Telephone Encounter (Signed)
Patient wife notified of results and lab appt scheduled with wife.

## 2021-11-21 NOTE — Telephone Encounter (Signed)
-----   Message from Irine Seal, MD sent at 11/21/2021  9:34 AM EST ----- His PSA is markedly elevated at 53 which suggests either a lab error or prostatitis.   I would like to see if we can get that repeated prior to his visit on 12/15.  See if he can come for a lab draw today or early tomorrow.    ----- Message ----- From: Dorisann Frames, RN Sent: 11/18/2021  12:58 PM EST To: Irine Seal, MD  Please review

## 2021-11-22 ENCOUNTER — Other Ambulatory Visit: Payer: Medicare Other

## 2021-11-22 ENCOUNTER — Other Ambulatory Visit: Payer: Self-pay

## 2021-11-23 LAB — PSA: Prostate Specific Ag, Serum: 66.7 ng/mL — ABNORMAL HIGH (ref 0.0–4.0)

## 2021-11-24 ENCOUNTER — Encounter: Payer: Self-pay | Admitting: Urology

## 2021-11-24 ENCOUNTER — Other Ambulatory Visit: Payer: Self-pay

## 2021-11-24 ENCOUNTER — Ambulatory Visit: Payer: Medicare Other | Admitting: Urology

## 2021-11-24 VITALS — BP 165/64 | HR 70

## 2021-11-24 DIAGNOSIS — N138 Other obstructive and reflux uropathy: Secondary | ICD-10-CM

## 2021-11-24 DIAGNOSIS — N403 Nodular prostate with lower urinary tract symptoms: Secondary | ICD-10-CM

## 2021-11-24 DIAGNOSIS — R9721 Rising PSA following treatment for malignant neoplasm of prostate: Secondary | ICD-10-CM | POA: Diagnosis not present

## 2021-11-24 DIAGNOSIS — K4090 Unilateral inguinal hernia, without obstruction or gangrene, not specified as recurrent: Secondary | ICD-10-CM | POA: Diagnosis not present

## 2021-11-24 DIAGNOSIS — C61 Malignant neoplasm of prostate: Secondary | ICD-10-CM

## 2021-11-24 DIAGNOSIS — M25551 Pain in right hip: Secondary | ICD-10-CM

## 2021-11-24 DIAGNOSIS — N3941 Urge incontinence: Secondary | ICD-10-CM | POA: Diagnosis not present

## 2021-11-24 DIAGNOSIS — R351 Nocturia: Secondary | ICD-10-CM

## 2021-11-24 LAB — URINALYSIS, ROUTINE W REFLEX MICROSCOPIC
Bilirubin, UA: NEGATIVE
Glucose, UA: NEGATIVE
Ketones, UA: NEGATIVE
Leukocytes,UA: NEGATIVE
Nitrite, UA: NEGATIVE
Specific Gravity, UA: 1.025 (ref 1.005–1.030)
Urobilinogen, Ur: 1 mg/dL (ref 0.2–1.0)
pH, UA: 5.5 (ref 5.0–7.5)

## 2021-11-24 LAB — MICROSCOPIC EXAMINATION
Bacteria, UA: NONE SEEN
Renal Epithel, UA: NONE SEEN /hpf
WBC, UA: NONE SEEN /hpf (ref 0–5)

## 2021-11-24 NOTE — Progress Notes (Signed)
Subjective:  1. Nocturia   2. Prostate cancer ([place])   3. Rising PSA following treatment for malignant neoplasm of prostate   4. Urge incontinence   5. Hip pain, acute, right   6. 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   His PSA has declined to 5.4 from 8.1 in 3/22.  He is voiding ok with moderate LUTS and an IPSS of 14 with nocturia x 3.   He has had no further gross hematuria and his UA is unremarkable today.   He remains on warfarin and his INR is 2.4 on 05/12/21.     On initial biopsy, he had only 5% in one core and elected active survelliance. He had a repeat biopsy on 12/24/13 that showed a single core with <5% Gleason 6 in the right mid lateral prostate. His last biopsy was on 06/28/18 and only showed 2 cores with atypia. He is on finasteride and has had  a slowly rising PSA that was up to 6.4 prior to his most recent biopsy on 06/25/20.  The nadir on finasteride was 1.8.    The repeat biopsy demonstrated 6 positive cores with 2 cores with 7(4+3) on the right and 4 cores with low volume Gleason 6 disease.    His prostate volume is 68m. IPSS is 9.  A bone scan on 07/06/20 was negative.    The CT on 07/29/20 was negative for mets.   His PSA was 6.2 prior to therapy starting finasteride.   11/24/21:  Mr. WKepnerreturns today in f/u for the history above.  His PSA was 5.4 in 6/22 but a repeat prior to this visit was 53.2 on 11/17/21 and then 66.7 on 11/22/21.  He has no weight loss but he has some new hip pain for the last 3 weeks.  He has no new voiding complaint but he has urgency with UUI.  His UA is unremarkable today.       ROS:  ROS:  A complete review of systems was performed.  All systems  are negative except for pertinent findings as noted.   ROS  Allergies  Allergen Reactions   Allopurinol Other (See Comments)    Significantly decreased WBC's   Colchicine Other (See Comments)    Significantly decreased WBC's    Outpatient Encounter Medications as of 11/24/2021  Medication Sig   acetaminophen (TYLENOL) 500 MG tablet Take 500 mg by mouth every 6 (six) hours as needed.   amLODipine (NORVASC) 10 MG tablet TAKE 1 TABLET BY MOUTH ONCE DAILY IN THE EVENING   Blood Glucose Monitoring Suppl (ONE TOUCH ULTRA MINI) w/Device KIT 1 each by Does not apply route 2 (two) times daily.   chlorthalidone (HYGROTON) 25 MG tablet Take 1 tablet by mouth once daily   finasteride (PROSCAR) 5 MG tablet Take 1 tablet (5 mg total) by mouth daily.   fluticasone (FLONASE) 50 MCG/ACT nasal spray Place 1 spray into both nostrils 2 (two) times daily as needed for allergies or rhinitis.   glucose blood (ONE TOUCH ULTRA TEST) test strip Use to check BG once daily.  Dx:  Type 2 DM controlled E11.9   lisinopril (ZESTRIL) 20 MG tablet Take 1.5 tablets (30 mg total)  by mouth daily.   metFORMIN (GLUCOPHAGE) 1000 MG tablet Take 1 tablet (1,000 mg total) by mouth 2 (two) times daily with a meal.   metoprolol succinate (TOPROL-XL) 100 MG 24 hr tablet Take 1 tablet (100 mg total) by mouth daily. Take with or immediately following a meal.   Multiple Vitamins-Minerals (CENTRUM SILVER 50+MEN) TABS Take by mouth daily.   omeprazole (PRILOSEC) 20 MG capsule Take 1 capsule (20 mg total) by mouth daily.   potassium chloride SA (KLOR-CON) 20 MEQ tablet TAKE 1  BY MOUTH ONCE DAILY   pravastatin (PRAVACHOL) 40 MG tablet Take 1 tablet (40 mg total) by mouth every evening.   Sodium Sulfate-Mag Sulfate-KCl (SUTAB) [phone] MG TABS Take by mouth as needed.    SV IRON 325 MG tablet Take 1 tablet by mouth once daily with breakfast   warfarin (COUMADIN) 5 MG tablet TAKE 1 & 1/2 (ONE & ONE-HALF) TABLETS BY MOUTH ONCE DAILY 6  IN THE EVENING   No facility-administered encounter medications on file as of 11/24/2021.    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   found during hospital stay with pancreatitis,  started coumadin;   last Abd CT in epic 07-29-2020  chronic thrombus portal venous   GERD (gastroesophageal reflux disease)    Heart murmur    History of acute pancreatitis    2016   necrotizing pancreatitis   History of adenomatous polyp of colon    History of DVT of lower extremity    History of GI bleed 08/2019   upper gi bleed due to duodenal ulcer   History of gout yrs ago   Hypertension    followed by pcp   Hypertrophic cardiomyopathy ([place])    followed by cardiology   Internal hemorrhoids    Mixed hyperlipidemia    Normocytic anemia    PAF (paroxysmal atrial fibrillation) ([place]) 04/2016   cardiologist-- dr h. Tamala Julian--  event monitor 07-04-2017 epic, NSR/ PAflutter with occasional RVR/  PACs/ PVCs ;  echo 05-16-2017 epic,  moderate LVH with severe asymptomic septal hypertrophy, G1DD, EF 60-65%,  mild AR, mild LAE   Pancreatic pseudocyst    Peyronie's disease    Prostate cancer [facility]) urologist-- dr Farah Lepak/ oncologist-- dr Tammi Klippel   first dx 08/ 2009 Gleason 3+3 active survelliance;  until bx 07/ 2021  Stage T2b, Gleason 4+3   Right inguinal hernia    Type 2 diabetes mellitus ([place])    followed by pcp---  (11-03-2020 per pt currently not checking blood sugar due to glucose monitor broken)   Wears glasses    Wears partial dentures    upper and lower    Past Surgical History:  Procedure Laterality Date   CHOLECYSTECTOMY N/A 11/26/2015   Procedure: LAPAROSCOPIC CHOLECYSTECTOMY;  Surgeon: Aviva Signs, MD;  Location: [facility];  Service: General;  Laterality: N/A;   COLONOSCOPY  last one 09-06-2020  dr  stark   CYSTOSCOPY N/A 11/09/2020   Procedure: Erlene Quan;  Surgeon: Irine Seal, MD;  Location: [facility];  Service: Urology;  Laterality: N/A;   ESOPHAGOGASTRODUODENOSCOPY  10-06-2019  _0    FLEXIBLE BRONCHOSCOPY Bilateral 04/14/2016   Procedure: FLEXIBLE BRONCHOSCOPY;  Surgeon: Sinda Du, MD;  Location: [facility];  Service: Cardiopulmonary;  Laterality:  Bilateral;   HEMORRHOID SURGERY  yrs ago   RADIOACTIVE SEED IMPLANT N/A 11/09/2020   Procedure: RADIOACTIVE SEED IMPLANT/BRACHYTHERAPY IMPLANT;  Surgeon: Irine Seal, MD;  Location: [facility];  Service: Urology;  Laterality: N/A;   SPACE OAR INSTILLATION N/A 11/09/2020   Procedure: SPACE OAR INSTILLATION;  Surgeon: Irine Seal, MD;  Location: [facility];  Service: Urology;  Laterality: N/A;   UMBILICAL HERNIA REPAIR N/A 11/26/2015   Procedure: UMBILICAL HERNIORRHAPHY;  Surgeon: Aviva Signs, MD;  Location: [facility];  Service: General;  Laterality: N/A;    Social History   Socioeconomic History   Marital status: Married    Spouse name: [name]   Number of children: 4   Years of education: Not on file   Highest education level: 10th grade  Occupational History   Occupation: Retired     Fish farm manager: [employer]  Tobacco Use   Smoking status: Former    Years: 5.00    Types: Cigarettes    Quit date: 12/11/1986    Years since quitting: 34.9   Smokeless tobacco: Never  Vaping Use   Vaping Use: Never used  Substance and Sexual Activity   Alcohol use: No   Drug use: Never   Sexual activity: Yes  Other Topics Concern   Not on file  Social History Narrative   Not on file   Social Determinants of Health   Financial Resource Strain: Low Risk    Difficulty of Paying Living Expenses: Not hard at all  Food Insecurity: No Food Insecurity   Worried About Charity fundraiser in the Last Year: Never true   [place] in the Last Year: Never true  Transportation Needs: No  Transportation Needs   Lack of Transportation (Medical): No   Lack of Transportation (Non-Medical): No  Physical Activity: Inactive   Days of Exercise per Week: 0 days   Minutes of Exercise per Session: 0 min  Stress: Stress Concern Present   Feeling of Stress : To some extent  Social Connections: Socially Integrated   Frequency of Communication with Friends and Family: Three times a week   Frequency of Social Gatherings with Friends and Family: Three times a week   Attends Religious Services: More than 4 times per year   Active Member of Clubs or Organizations: Yes   Attends Music therapist: More than 4 times per year   Marital Status: Married  Human resources officer Violence: Not At Risk   Fear of Current or Ex-Partner: No   Emotionally Abused: No   Physically Abused: No   Sexually Abused: No    Family History  Problem Relation Age of Onset   Stroke Mother    Hypertension Mother    Alzheimer's disease Mother    Hypertension Father    Brain cancer Sister    Breast cancer Sister    Hypertension Sister    Deep vein thrombosis Brother    Hypertension Brother    Colon cancer Neg Hx    Pancreatic disease Neg Hx    Esophageal cancer Neg Hx    Rectal cancer Neg Hx    Stomach cancer Neg Hx        Objective: Vitals:   11/24/21 1424  BP: (!) 165/64  Pulse: 70     Physical Exam Vitals reviewed.  Constitutional:      Appearance: Normal appearance.  Abdominal:     General: Abdomen is flat.     Palpations: Abdomen is soft.  Hernia: A hernia (Giant RIH with scrotal component) is present.  Genitourinary:    Comments: Uncirc phallus with adequate meatus. Large right scrotal component of ing hernia.  Right testicle and epididymis not palpable, left normal. AP without lesions. NST without mass. Prostate smooth and flat. SV non-palpable.   Lymphadenopathy:     Cervical: No cervical adenopathy.     Upper Body:     Right upper body: No supraclavicular or  axillary adenopathy.     Left upper body: No supraclavicular or axillary adenopathy.     Lower Body: No right inguinal adenopathy. No left inguinal adenopathy.  Neurological:     Mental Status: He is alert.   Lab Results  Component Value Date   PSA1 66.7 (H) 11/22/2021   PSA1 53.2 (H) 11/17/2021   PSA1 5.4 (H) 05/19/2021    Lab Results:  No results found for this or any previous visit (from the past 24 hour(s)).   BMET No results for input(s): NA, K, CL, CO2, GLUCOSE, BUN, CREATININE, CALCIUM in the last 72 hours. PSA PSA  Date Value Ref Range Status  05/20/2020 6.4 (H) < OR = 4.0 ng/mL Final    Comment:    The total PSA value from this assay system is  standardized against the WHO standard. The test  result will be approximately 20% lower when compared  to the equimolar-standardized total PSA (Beckman  Coulter). Comparison of serial PSA results should be  interpreted with this fact in mind. . This test was performed using the Siemens  chemiluminescent method. Values obtained from  different assay methods cannot be used interchangeably. PSA levels, regardless of value, should not be interpreted as absolute evidence of the presence or absence of disease.   11/12/2014 2.0 0.0 - 4.0 ng/mL Final    Comment:    Roche ECLIA methodology. According to the American Urological Association, Serum PSA should decrease and remain at undetectable levels after radical prostatectomy. The AUA defines biochemical recurrence as an initial PSA value 0.2 ng/mL or greater followed by a subsequent confirmatory PSA value 0.2 ng/mL or greater. Values obtained with different assay methods or kits cannot be used interchangeably. Results cannot be interpreted as absolute evidence of the presence or absence of malignant disease.    No results found for: TESTOSTERONE  UA has 0-2 RBC's  Studies/Results: No results found.   Assessment & Plan: Prostate cancer. His PSA is rising rapidly which  is very uncharacteristic of a post seed recurrence but he has new hip pain so I will restage with a CT and bone scan.   Nodular prostate with obstruction and nocturia with some increased urgency.  He will continue the finasteride.    Hematuria.  He has had no further bleeding.  His INR was 2.3 on last check.   RIH.  He has a massive RIH with scrotal involvement that is impacting his walking.   I have recommended that he get back to see Dr. Arnoldo Morale.   No orders of the defined types were placed in this encounter.    Orders Placed This Encounter  Procedures   CT Abdomen Pelvis W Contrast    Standing Status:   Future    Standing Expiration Date:   11/24/2022    Order Specific Question:   If indicated for the ordered procedure, I authorize the administration of contrast media per Radiology protocol    Answer:   Yes    Order Specific Question:   Preferred imaging location?  Answer:   [facility]    Order Specific Question:   Is Oral Contrast requested for this exam?    Answer:   Yes, Per Radiology protocol   NM Bone Scan Multiple    Standing Status:   Future    Standing Expiration Date:   11/24/2022    Order Specific Question:   If indicated for the ordered procedure, I authorize the administration of a radiopharmaceutical per Radiology protocol    Answer:   Yes    Order Specific Question:   Preferred imaging location?    Answer:   [facility]   Urinalysis, Routine w reflex microscopic   PSA    Standing Status:   Future    Standing Expiration Date:   05/25/2022      Return in about 6 weeks (around 01/05/2022) for with imaging and PSA results. .   CC: Dettinger, Fransisca Kaufmann, MD      Irine Seal 11/24/2021 Patient ID: Alan Netters., male   DOB: [birth year], 80 y.o.   MRN: [id number]

## 2021-11-24 NOTE — Progress Notes (Signed)
Urological Symptom Review  Patient is experiencing the following symptoms: Frequent urination Get up at night to urinate Weak stream   Review of Systems  Gastrointestinal (upper)  : Indigestion/heartburn  Gastrointestinal (lower) : Diarrhea  Constitutional : Negative for symptoms  Skin: Negative for skin symptoms  Eyes: Negative for eye symptoms  Ear/Nose/Throat : Negative for Ear/Nose/Throat symptoms  Hematologic/Lymphatic: Negative for Hematologic/Lymphatic symptoms  Cardiovascular : Negative for cardiovascular symptoms  Respiratory : Negative for respiratory symptoms  Endocrine: Negative for endocrine symptoms  Musculoskeletal: Negative for musculoskeletal symptoms  Neurological: Negative for neurological symptoms  Psychologic: Negative for psychiatric symptoms

## 2021-11-25 ENCOUNTER — Ambulatory Visit (HOSPITAL_COMMUNITY)
Admission: RE | Admit: 2021-11-25 | Discharge: 2021-11-25 | Disposition: A | Discharge status: Home / Self Care | Payer: Medicare Other | Source: Ambulatory Visit | Attending: Urology | Admitting: Urology

## 2021-11-25 DIAGNOSIS — R9721 Rising PSA following treatment for malignant neoplasm of prostate: Secondary | ICD-10-CM | POA: Diagnosis present

## 2021-11-25 DIAGNOSIS — C61 Malignant neoplasm of prostate: Secondary | ICD-10-CM | POA: Diagnosis present

## 2021-11-25 DIAGNOSIS — K861 Other chronic pancreatitis: Secondary | ICD-10-CM | POA: Diagnosis not present

## 2021-11-25 DIAGNOSIS — K859 Acute pancreatitis without necrosis or infection, unspecified: Secondary | ICD-10-CM | POA: Diagnosis not present

## 2021-11-25 DIAGNOSIS — K863 Pseudocyst of pancreas: Secondary | ICD-10-CM | POA: Diagnosis not present

## 2021-11-25 LAB — POCT I-STAT CREATININE: Creatinine, Ser: 1.1 mg/dL (ref 0.61–1.24)

## 2021-11-25 MED ORDER — IOHEXOL 9 MG/ML PO SOLN
ORAL | Status: AC
  Filled 2021-11-25: qty 1000

## 2021-11-25 MED ORDER — IOHEXOL 300 MG/ML  SOLN
100.0000 mL | Freq: Once | INTRAMUSCULAR | Status: AC | PRN
  Administered 2021-11-25: 100 mL via INTRAVENOUS

## 2021-11-28 ENCOUNTER — Ambulatory Visit (HOSPITAL_COMMUNITY): Payer: Medicare Other

## 2021-11-28 ENCOUNTER — Telehealth: Payer: Self-pay

## 2021-11-28 NOTE — Telephone Encounter (Signed)
Patient called with no answer. Message left to return call to office. 

## 2021-11-28 NOTE — Telephone Encounter (Signed)
-----   Message from Irine Seal, MD sent at 11/28/2021 12:09 PM EST ----- No evidence of metastasis from the prostate are noted but there are some changes around the pancreas with further evaluation recommended.   He has seen Dr. [name] in the past so I will forward this report to him and see if he would like to get Mr. Dom seen.  ----- Message ----- From: Iris Pert, LPN Sent: [id number]/51/8984   4:00 PM EST To: Irine Seal, MD  Please review

## 2021-11-29 ENCOUNTER — Telehealth: Payer: Self-pay | Admitting: Gastroenterology

## 2021-11-29 ENCOUNTER — Telehealth: Payer: Self-pay

## 2021-11-29 DIAGNOSIS — K869 Disease of pancreas, unspecified: Secondary | ICD-10-CM

## 2021-11-29 NOTE — Telephone Encounter (Signed)
Patient returned your call.  Please call back.  Thank you. 

## 2021-11-29 NOTE — Telephone Encounter (Signed)
Patient called and was asking about his results, his results were given and pt was understanding.

## 2021-11-29 NOTE — Telephone Encounter (Signed)
-----   Message from Irine Seal, MD sent at 11/28/2021 12:09 PM EST ----- No evidence of metastasis from the prostate are noted but there are some changes around the pancreas with further evaluation recommended.   He has seen Dr. [name] in the past so I will forward this report to him and see if he would like to get Alan [name] seen.  ----- Message ----- From: Iris Pert, LPN Sent: 04/88/8916   4:00 PM EST To: Irine Seal, MD  Please review

## 2021-11-29 NOTE — Addendum Note (Signed)
Addended by: Marlon Pel on: 11/29/2021 02:29 PM   Modules accepted: Orders

## 2021-11-29 NOTE — Progress Notes (Signed)
Sent via mail 

## 2021-11-29 NOTE — Telephone Encounter (Signed)
Patient called with no answer. Message left to return call to office.  Letter sent via mail.

## 2021-11-29 NOTE — Telephone Encounter (Signed)
See CT results for details

## 2021-12-01 ENCOUNTER — Other Ambulatory Visit: Payer: Self-pay | Admitting: Family Medicine

## 2021-12-01 DIAGNOSIS — E1159 Type 2 diabetes mellitus with other circulatory complications: Secondary | ICD-10-CM

## 2021-12-06 ENCOUNTER — Other Ambulatory Visit: Payer: Self-pay | Admitting: Family Medicine

## 2021-12-06 DIAGNOSIS — E1159 Type 2 diabetes mellitus with other circulatory complications: Secondary | ICD-10-CM

## 2021-12-07 NOTE — Addendum Note (Signed)
Addended by: Dorisann Frames on: 12/07/2021 11:04 AM   Modules accepted: Orders

## 2021-12-07 NOTE — Progress Notes (Signed)
Otto from [facility] called and advised that Dr. Jeffie Pollock put an order in for [place] Multiple part exam. He advised they only do whole body and not partial.   Reviewed this order with Dr. Alyson Ingles who agreed bone scan order to be changed to bone scan whole body. Order corrected.

## 2021-12-08 ENCOUNTER — Encounter (HOSPITAL_COMMUNITY)
Admission: RE | Admit: 2021-12-08 | Discharge: 2021-12-08 | Disposition: A | Discharge status: Home / Self Care | Payer: Medicare Other | Source: Ambulatory Visit | Attending: Urology | Admitting: Urology

## 2021-12-08 ENCOUNTER — Other Ambulatory Visit: Payer: Self-pay | Admitting: Gastroenterology

## 2021-12-08 ENCOUNTER — Other Ambulatory Visit (HOSPITAL_COMMUNITY): Payer: Medicare Other

## 2021-12-08 ENCOUNTER — Other Ambulatory Visit: Payer: Self-pay

## 2021-12-08 ENCOUNTER — Encounter (HOSPITAL_COMMUNITY): Payer: Medicare Other

## 2021-12-08 ENCOUNTER — Ambulatory Visit (HOSPITAL_COMMUNITY)
Admission: RE | Admit: 2021-12-08 | Discharge: 2021-12-08 | Disposition: A | Discharge status: Home / Self Care | Payer: Medicare Other | Source: Ambulatory Visit | Attending: Gastroenterology | Admitting: Gastroenterology

## 2021-12-08 DIAGNOSIS — K8689 Other specified diseases of pancreas: Secondary | ICD-10-CM | POA: Diagnosis not present

## 2021-12-08 DIAGNOSIS — K869 Disease of pancreas, unspecified: Secondary | ICD-10-CM | POA: Diagnosis not present

## 2021-12-08 DIAGNOSIS — C61 Malignant neoplasm of prostate: Secondary | ICD-10-CM | POA: Diagnosis present

## 2021-12-08 DIAGNOSIS — K5730 Diverticulosis of large intestine without perforation or abscess without bleeding: Secondary | ICD-10-CM | POA: Diagnosis not present

## 2021-12-08 MED ORDER — GADOBUTROL 1 MMOL/ML IV SOLN
7.0000 mL | Freq: Once | INTRAVENOUS | Status: AC | PRN
  Administered 2021-12-08: 11:00:00 7 mL via INTRAVENOUS

## 2021-12-08 MED ORDER — TECHNETIUM TC 99M MEDRONATE IV KIT
20.0000 | PACK | Freq: Once | INTRAVENOUS | Status: AC | PRN
  Administered 2021-12-08: 10:00:00 21 via INTRAVENOUS

## 2021-12-13 ENCOUNTER — Telehealth: Payer: Self-pay

## 2021-12-13 DIAGNOSIS — C61 Malignant neoplasm of prostate: Secondary | ICD-10-CM

## 2021-12-13 NOTE — Telephone Encounter (Signed)
Patient wife called- notified of results and order placed.

## 2021-12-13 NOTE — Telephone Encounter (Signed)
-----   Message from Irine Seal, MD sent at 12/13/2021 10:15 AM EST ----- Mr. Geffre has an area of bone scan uptake in the thoracic spine and a CT Chest was recommended.  He doesn't have f/u with me until 2/2 so I would like to go ahead and get the CT Chest.   Since we are looking at the bone, he doesn't need contrast.   Please get that ordered.  Thanks.  ----- Message ----- From: Dorisann Frames, RN Sent: 12/09/2021   3:17 PM EST To: Irine Seal, MD  Please review

## 2021-12-15 ENCOUNTER — Encounter: Payer: Self-pay | Admitting: Family Medicine

## 2021-12-15 ENCOUNTER — Ambulatory Visit (INDEPENDENT_AMBULATORY_CARE_PROVIDER_SITE_OTHER): Payer: No Typology Code available for payment source | Admitting: Family Medicine

## 2021-12-15 VITALS — BP 143/56 | HR 52 | Ht 65.0 in | Wt 167.0 lb

## 2021-12-15 DIAGNOSIS — D6859 Other primary thrombophilia: Secondary | ICD-10-CM | POA: Diagnosis not present

## 2021-12-15 DIAGNOSIS — I480 Paroxysmal atrial fibrillation: Secondary | ICD-10-CM | POA: Diagnosis not present

## 2021-12-15 DIAGNOSIS — Z7901 Long term (current) use of anticoagulants: Secondary | ICD-10-CM | POA: Diagnosis not present

## 2021-12-15 DIAGNOSIS — Z86718 Personal history of other venous thrombosis and embolism: Secondary | ICD-10-CM

## 2021-12-15 LAB — COAGUCHEK XS/INR WAIVED
INR: 1.7 — ABNORMAL HIGH (ref 0.9–1.1)
Prothrombin Time: 20.1 s

## 2021-12-15 NOTE — Progress Notes (Signed)
BP (!) 143/56    Pulse (!) 52    Ht _0  (1.651 m)    Wt 167 lb (75.8 kg)    SpO2 98%    BMI 27.79 kg/m    Subjective:   Patient ID: Alan Netters., male    DOB: 03-Mar-1941, 81 y.o.   MRN: [id number]  HPI: Alan Lomax. is a 81 y.o. male presenting on 12/15/2021 for Paroxysmal atrial fibrillation   HPI Coumadin recheck Target goal: 2.0-3.0 Reason on anticoagulation: A. fib with history of DVTs Patient denies any bruising or bleeding or chest pain or palpitations   Relevant past medical, surgical, family and social history reviewed and updated as indicated. Interim medical history since our last visit reviewed. Allergies and medications reviewed and updated.  Review of Systems  Constitutional:  Negative for chills and fever.  Eyes:  Negative for visual disturbance.  Respiratory:  Negative for shortness of breath and wheezing.   Cardiovascular:  Negative for chest pain and leg swelling.  Musculoskeletal:  Negative for back pain and gait problem.  Skin:  Negative for rash.  All other systems reviewed and are negative.  Per HPI unless specifically indicated above   Allergies as of 12/15/2021       Reactions   Allopurinol Other (See Comments)   Significantly decreased WBC's   Colchicine Other (See Comments)   Significantly decreased WBC's        Medication List        Accurate as of December 15, 2021  1:21 PM. If you have any questions, ask your nurse or doctor.          acetaminophen 500 MG tablet Commonly known as: TYLENOL Take 500 mg by mouth every 6 (six) hours as needed.   amLODipine 10 MG tablet Commonly known as: NORVASC TAKE 1 TABLET BY MOUTH ONCE DAILY IN THE EVENING   Centrum Silver 50+Men Tabs Take by mouth daily.   chlorthalidone 25 MG tablet Commonly known as: HYGROTON Take 1 tablet by mouth once daily   finasteride 5 MG tablet Commonly known as: PROSCAR Take 1 tablet (5 mg total) by mouth daily.   fluticasone 50 MCG/ACT nasal  spray Commonly known as: FLONASE Place 1 spray into both nostrils 2 (two) times daily as needed for allergies or rhinitis.   glucose blood test strip Commonly known as: ONE TOUCH ULTRA TEST Use to check BG once daily.  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   metoprolol succinate 100 MG 24 hr tablet Commonly known as: TOPROL-XL Take 1 tablet (100 mg total) by mouth daily. Take with or immediately following a meal.   omeprazole 20 MG capsule Commonly known as: PRILOSEC Take 1 capsule (20 mg total) by mouth daily.   ONE TOUCH ULTRA MINI w/Device Kit 1 each by Does not apply route 2 (two) times daily.   potassium chloride SA 20 MEQ tablet Commonly known as: KLOR-CON M TAKE 1  BY MOUTH ONCE DAILY   pravastatin 40 MG tablet Commonly known as: PRAVACHOL Take 1 tablet (40 mg total) by mouth every evening.   Sutab [phone] MG Tabs Generic drug: Sodium Sulfate-Mag Sulfate-KCl Take by mouth as needed.   SV Iron 325 (65  FE) MG tablet Generic drug: ferrous sulfate Take 1 tablet by mouth once daily with breakfast   warfarin 5 MG tablet Commonly known as: COUMADIN Take as directed by the anticoagulation clinic. If you are unsure how to take this medication, talk to your nurse or doctor. Original instructions: TAKE 1 & 1/2 (ONE & ONE-HALF) TABLETS BY MOUTH ONCE DAILY 6 IN THE EVENING         Objective:   BP (!) 143/56    Pulse (!) 52    Ht _0  (1.651 m)    Wt 167 lb (75.8 kg)    SpO2 98%    BMI 27.79 kg/m   Wt Readings from Last 3 Encounters:  12/15/21 167 lb (75.8 kg)  11/02/21 162 lb (73.5 kg)  09/21/21 168 lb (76.2 kg)    Physical Exam Vitals and nursing note reviewed.  Constitutional:      General: He is not in acute  distress.    Appearance: He is well-developed. He is not diaphoretic.  Eyes:     General: No scleral icterus.    Conjunctiva/sclera: Conjunctivae normal.  Neck:     Thyroid: No thyromegaly.  Neurological:     Mental Status: He is alert and oriented to person, place, and time.     Coordination: Coordination normal.  Psychiatric:        Behavior: Behavior normal.    Description   increase current dose to take 1 tablet every day of the week except for Mondays, Wednesdays and Saturdays take 1 1/2 tablets  INR was 1.7 (goal 2-3)   Follow-up 4-6 weeks      Assessment & Plan:   Problem List Items Addressed This Visit       Cardiovascular and Mediastinum   Paroxysmal atrial fibrillation ([place]) - Primary   Relevant Orders   CoaguChek XS/INR Waived     Hematopoietic and Hemostatic   Primary hypercoagulable state ([place]) [D68.59]     Other   Long term (current) use of anticoagulants [Z79.01]   History of DVT (deep vein thrombosis)     Follow up plan: Return if symptoms worsen or fail to improve, for 3 to 4-week INR recheck.  Counseling provided for all of the vaccine components Orders Placed This Encounter  Procedures   CoaguChek XS/INR [place] Arnaldo Heffron, MD [facility] 12/15/2021, 1:21 PM

## 2021-12-23 ENCOUNTER — Other Ambulatory Visit: Payer: Self-pay

## 2021-12-23 ENCOUNTER — Telehealth: Payer: Medicare Other

## 2021-12-23 DIAGNOSIS — K869 Disease of pancreas, unspecified: Secondary | ICD-10-CM

## 2021-12-23 DIAGNOSIS — K8591 Acute pancreatitis with uninfected necrosis, unspecified: Secondary | ICD-10-CM

## 2022-01-03 ENCOUNTER — Ambulatory Visit (HOSPITAL_COMMUNITY)
Admission: RE | Admit: 2022-01-03 | Discharge: 2022-01-03 | Disposition: A | Discharge status: Home / Self Care | Payer: No Typology Code available for payment source | Source: Ambulatory Visit | Attending: Urology | Admitting: Urology

## 2022-01-03 ENCOUNTER — Other Ambulatory Visit: Payer: Self-pay

## 2022-01-03 DIAGNOSIS — I700 Atherosclerosis of aorta: Secondary | ICD-10-CM | POA: Diagnosis not present

## 2022-01-03 DIAGNOSIS — C61 Malignant neoplasm of prostate: Secondary | ICD-10-CM | POA: Diagnosis not present

## 2022-01-04 ENCOUNTER — Encounter: Payer: Self-pay | Admitting: Family Medicine

## 2022-01-04 ENCOUNTER — Ambulatory Visit (INDEPENDENT_AMBULATORY_CARE_PROVIDER_SITE_OTHER): Payer: No Typology Code available for payment source | Admitting: Family Medicine

## 2022-01-04 VITALS — BP 139/67 | HR 54 | Ht 65.0 in | Wt 158.0 lb

## 2022-01-04 DIAGNOSIS — D6859 Other primary thrombophilia: Secondary | ICD-10-CM | POA: Diagnosis not present

## 2022-01-04 DIAGNOSIS — Z86718 Personal history of other venous thrombosis and embolism: Secondary | ICD-10-CM

## 2022-01-04 DIAGNOSIS — Z7901 Long term (current) use of anticoagulants: Secondary | ICD-10-CM

## 2022-01-04 LAB — COAGUCHEK XS/INR WAIVED
INR: 2.9 — ABNORMAL HIGH (ref 0.9–1.1)
Prothrombin Time: 34.5 s

## 2022-01-04 NOTE — Progress Notes (Signed)
BP 139/67    Pulse (!) 54    Ht '5\' 5"'  (1.651 m)    Wt 158 lb (71.7 kg)    SpO2 98%    BMI 26.29 kg/m    Subjective:   Patient ID: Alan [name]., male    DOB: 06-29-1941, 81 y.o.   MRN: [id number]  HPI: [name]. is a 81 y.o. male presenting on 01/04/2022 for Medical Management of Chronic Issues and Atrial Fibrillation   HPI Coumadin recheck Target goal: 2.0-3.0 Reason on anticoagulation: afib and history of DVT Patient denies any bruising or bleeding or chest pain or palpitations   Relevant past medical, surgical, family and social history reviewed and updated as indicated. Interim medical history since our last visit reviewed. Allergies and medications reviewed and updated.  Review of Systems  Constitutional:  Negative for chills and fever.  Eyes:  Negative for visual disturbance.  Respiratory:  Negative for shortness of breath and wheezing.   Cardiovascular:  Negative for chest pain and leg swelling.  Musculoskeletal:  Negative for back pain and gait problem.  Skin:  Negative for rash.  Neurological:  Negative for dizziness, weakness and light-headedness.  All other systems reviewed and are negative.  Per HPI unless specifically indicated above   Allergies as of 01/04/2022       Reactions   Allopurinol Other (See Comments)   Significantly decreased WBC's   Colchicine Other (See Comments)   Significantly decreased WBC's        Medication List        Accurate as of January 04, 2022  1:42 PM. If you have any questions, ask your nurse or doctor.          acetaminophen 500 MG tablet Commonly known as: TYLENOL Take 500 mg by mouth every 6 (six) hours as needed.   amLODipine 10 MG tablet Commonly known as: NORVASC TAKE 1 TABLET BY MOUTH ONCE DAILY IN THE EVENING   Centrum Silver 50+Men Tabs Take by mouth daily.   chlorthalidone 25 MG tablet Commonly known as: HYGROTON Take 1 tablet by mouth once daily   finasteride 5 MG tablet Commonly known as:  PROSCAR Take 1 tablet (5 mg total) by mouth daily.   fluticasone 50 MCG/ACT nasal spray Commonly known as: FLONASE Place 1 spray into both nostrils 2 (two) times daily as needed for allergies or rhinitis.   glucose blood test strip Commonly known as: ONE TOUCH ULTRA TEST Use to check BG once daily.  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   metoprolol succinate 100 MG 24 hr tablet Commonly known as: TOPROL-XL Take 1 tablet (100 mg total) by mouth daily. Take with or immediately following a meal.   omeprazole 20 MG capsule Commonly known as: PRILOSEC Take 1 capsule (20 mg total) by mouth daily.   ONE TOUCH ULTRA MINI w/Device Kit 1 each by Does not apply route 2 (two) times daily.   potassium chloride SA 20 MEQ tablet Commonly known as: KLOR-CON M TAKE 1  BY MOUTH ONCE DAILY   pravastatin 40 MG tablet Commonly known as: PRAVACHOL Take 1 tablet (40 mg total) by mouth every evening.   Sutab [phone] MG Tabs Generic drug: Sodium Sulfate-Mag  Sulfate-KCl Take by mouth as needed.   SV Iron 325 (65 FE) MG tablet Generic drug: ferrous sulfate Take 1 tablet by mouth once daily with breakfast   warfarin 5 MG tablet Commonly known as: COUMADIN Take as directed by the anticoagulation clinic. If you are unsure how to take this medication, talk to your nurse or doctor. Original instructions: TAKE 1 & 1/2 (ONE & ONE-HALF) TABLETS BY MOUTH ONCE DAILY 6 IN THE EVENING         Objective:   BP 139/67    Pulse (!) 54    Ht '5\' 5"'  (1.651 m)    Wt 158 lb (71.7 kg)    SpO2 98%    BMI 26.29 kg/m   Wt Readings from Last 3 Encounters:  01/04/22 158 lb (71.7 kg)  12/15/21 167 lb (75.8 kg)  11/02/21 162 lb (73.5 kg)    Physical Exam Vitals and  nursing note reviewed.  Constitutional:      General: He is not in acute distress.    Appearance: He is well-developed. He is not diaphoretic.  Eyes:     General: No scleral icterus.    Conjunctiva/sclera: Conjunctivae normal.  Neck:     Thyroid: No thyromegaly.  Cardiovascular:     Rate and Rhythm: Normal rate. Rhythm irregular.     Heart sounds: Normal heart sounds. No murmur heard. Pulmonary:     Effort: Pulmonary effort is normal. No respiratory distress.     Breath sounds: Normal breath sounds. No wheezing.  Skin:    General: Skin is warm and dry.     Findings: No rash.  Neurological:     Mental Status: He is alert and oriented to person, place, and time.     Coordination: Coordination normal.  Psychiatric:        Behavior: Behavior normal.    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  Counseling provided for all of the vaccine components Orders Placed This Encounter  Procedures   CoaguChek XS/INR [place] Kenly Xiao, MD [facility] 01/04/2022, 1:42 PM

## 2022-01-09 ENCOUNTER — Telehealth: Payer: Self-pay

## 2022-01-09 NOTE — Telephone Encounter (Signed)
PA started on line with [employer] through [employer] for patient Alan [name]. - pending.

## 2022-01-09 NOTE — Telephone Encounter (Signed)
-----   Message from Irine Seal, MD sent at 01/05/2022 11:29 AM EST ----- Thanks.   He also had an MRI in December and I don't recall ever being sent the report.  It was ordered by Dr. 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  These were felt to be degenerative on bonescan but the MRI is more accurate.    He is scheduled to see  me next week and will need to be preauthorized for firmagon if that is required for his insurance.     ----- Message ----- From: Dorisann Frames, RN Sent: 01/04/2022  12:09 PM EST To: Irine Seal, MD  Please review

## 2022-01-12 ENCOUNTER — Other Ambulatory Visit: Payer: Self-pay

## 2022-01-12 ENCOUNTER — Ambulatory Visit: Payer: No Typology Code available for payment source | Admitting: Family Medicine

## 2022-01-12 ENCOUNTER — Ambulatory Visit (INDEPENDENT_AMBULATORY_CARE_PROVIDER_SITE_OTHER): Payer: No Typology Code available for payment source | Admitting: Urology

## 2022-01-12 ENCOUNTER — Encounter: Payer: Self-pay | Admitting: Urology

## 2022-01-12 VITALS — BP 142/65 | HR 61 | Wt 151.8 lb

## 2022-01-12 DIAGNOSIS — C7951 Secondary malignant neoplasm of bone: Secondary | ICD-10-CM

## 2022-01-12 DIAGNOSIS — M25551 Pain in right hip: Secondary | ICD-10-CM | POA: Diagnosis not present

## 2022-01-12 DIAGNOSIS — C61 Malignant neoplasm of prostate: Secondary | ICD-10-CM | POA: Diagnosis not present

## 2022-01-12 DIAGNOSIS — N3941 Urge incontinence: Secondary | ICD-10-CM | POA: Diagnosis not present

## 2022-01-12 MED ORDER — DEGARELIX ACETATE(240 MG DOSE) 120 MG/VIAL ~~LOC~~ SOLR
240.0000 mg | Freq: Once | SUBCUTANEOUS | Status: AC
  Administered 2022-01-12: 240 mg via SUBCUTANEOUS

## 2022-01-12 NOTE — Patient Instructions (Signed)
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   This needs to be treated with hormone suppression and will eventually need some bone targeted therapy with a medicine call Delton See and possible an oral medication in addition to the injections we are starting today.   I will set you up to see Dr. Delton Coombes at the [facility] to discuss those treatment.

## 2022-01-12 NOTE — Progress Notes (Signed)
Urological Symptom Review  Patient is experiencing the following symptoms: Hard to postpone urination   Review of Systems  Gastrointestinal (upper)  : Negative for upper GI symptoms  Gastrointestinal (lower) : Negative for lower GI symptoms  Constitutional : Negative for symptoms  Skin: Negative for skin symptoms  Eyes: Negative for eye symptoms  Ear/Nose/Throat : Negative for Ear/Nose/Throat symptoms  Hematologic/Lymphatic: Negative for Hematologic/Lymphatic symptoms  Cardiovascular : Negative for cardiovascular symptoms  Respiratory : Negative for respiratory symptoms  Endocrine: Negative for endocrine symptoms  Musculoskeletal: Negative for musculoskeletal symptoms  Neurological: Negative for neurological symptoms  Psychologic: Negative for psychiatric symptoms

## 2022-01-12 NOTE — Progress Notes (Signed)
Subjective:  1. Urge incontinence   2. Hip pain, acute, right   3. Prostate cancer ([place])   4. 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   His PSA has declined to 5.4 from 8.1 in 3/22.  He is voiding ok with moderate LUTS and an IPSS of 14 with nocturia x 3.   He has had no further gross hematuria and his UA is unremarkable today.   He remains on warfarin and his INR is 2.4 on 05/12/21.     On initial biopsy, he had only 5% in one core and elected active survelliance. He had a repeat biopsy on 12/24/13 that showed a single core with <5% Gleason 6 in the right mid lateral prostate. His last biopsy was on 06/28/18 and only showed 2 cores with atypia. He is on finasteride and has had  a slowly rising PSA that was up to 6.4 prior to his most recent biopsy on 06/25/20.  The nadir on finasteride was 1.8.    The repeat biopsy demonstrated 6 positive cores with 2 cores with 7(4+3) on the right and 4 cores with low volume Gleason 6 disease.    His prostate volume is 75m. IPSS is 9.  A bone scan on 07/06/20 was negative.    The CT on 07/29/20 was negative for mets.   His PSA was 6.2 prior to therapy starting finasteride.   11/24/21:  Mr. WLazenbyreturns today in f/u for the history above.  His PSA was 5.4 in 6/22 but a repeat prior to this visit was 53.2 on 11/17/21 and then 66.7 on 11/22/21.  He has no weight loss but he has some new hip pain for the last 3 weeks.  He has no new voiding complaint but he has urgency with UUI.  His UA is unremarkable today.   01/12/22: Mr. WKosikreturns today in f/u.  He was found to have a rapidly rising PSA and was restaged.  The bone scan showed a possible thoracic vertebral met but this was not  confirmed by Chest CT.   The CT [facility] showed no evidence of mets but a new peripancreatic process so GI was consulted.   An abdominal MRI was obtained that showed multiple mets in the pelvic bones.   He is scheduled for upper endoscopy for the pancreatic lesion on 02/09/22.        ROS:  ROS:  A complete review of systems was performed.  All systems are negative except for pertinent findings as noted.   ROS  Allergies  Allergen Reactions   Allopurinol Other (See Comments)    Significantly decreased WBC's   Colchicine Other (See Comments)    Significantly decreased WBC's    Outpatient Encounter Medications as of 01/12/2022  Medication Sig   acetaminophen (TYLENOL) 500 MG tablet Take 500 mg by mouth every 6 (six) hours as needed.   amLODipine (NORVASC) 10 MG tablet TAKE 1 TABLET BY MOUTH ONCE DAILY IN THE EVENING   Blood Glucose Monitoring Suppl (ONE TOUCH ULTRA MINI) w/Device KIT 1 each by Does not apply route 2 (two) times daily.   chlorthalidone (HYGROTON) 25 MG tablet Take 1 tablet by mouth once daily  fluticasone (FLONASE) 50 MCG/ACT nasal spray Place 1 spray into both nostrils 2 (two) times daily as needed for allergies or rhinitis.   glucose blood (ONE TOUCH ULTRA TEST) test strip Use to check BG once daily.  Dx:  Type 2 DM controlled E11.9   lisinopril (ZESTRIL) 20 MG tablet TAKE 1 & 1/2 (ONE & ONE-HALF) TABLETS BY MOUTH ONCE DAILY   metFORMIN (GLUCOPHAGE) 1000 MG tablet Take 1 tablet (1,000 mg total) by mouth 2 (two) times daily with a meal.   metoprolol succinate (TOPROL-XL) 100 MG 24 hr tablet Take 1 tablet (100 mg total) by mouth daily. Take with or immediately following a meal.   Multiple Vitamins-Minerals (CENTRUM SILVER 50+MEN) TABS Take by mouth daily.   omeprazole (PRILOSEC) 20 MG capsule Take 1 capsule (20 mg total) by mouth daily.   potassium chloride SA (KLOR-CON) 20 MEQ tablet TAKE 1  BY MOUTH ONCE DAILY   pravastatin (PRAVACHOL) 40 MG tablet Take 1 tablet (40 mg total)  by mouth every evening.   Sodium Sulfate-Mag Sulfate-KCl (SUTAB) [phone] MG TABS Take by mouth as needed.    SV IRON 325 MG tablet Take 1 tablet by mouth once daily with breakfast   warfarin (COUMADIN) 5 MG tablet TAKE 1 & 1/2 (ONE & ONE-HALF) TABLETS BY MOUTH ONCE DAILY 6 IN THE EVENING   [DISCONTINUED] finasteride (PROSCAR) 5 MG tablet Take 1 tablet (5 mg total) by mouth daily.   [EXPIRED] degarelix (FIRMAGON) injection 240 mg    No facility-administered encounter medications on file as of 01/12/2022.    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   found during hospital stay with pancreatitis,  started coumadin;   last Abd CT in epic 07-29-2020  chronic thrombus portal venous   GERD (gastroesophageal reflux disease)    Heart murmur    History of acute pancreatitis    2016   necrotizing pancreatitis   History of adenomatous polyp of colon    History of DVT of lower extremity    History of GI bleed 08/2019   upper gi bleed due to duodenal ulcer   History of gout yrs ago   Hypertension    followed by pcp   Hypertrophic cardiomyopathy ([place])    followed by cardiology   Internal hemorrhoids    Mixed hyperlipidemia    Normocytic anemia    PAF (paroxysmal atrial fibrillation) ([place]) 04/2016   cardiologist-- dr h. Tamala Julian--  event monitor 07-04-2017 epic, NSR/ PAflutter with occasional RVR/  PACs/ PVCs ;  echo 05-16-2017 epic,  moderate LVH with severe asymptomic septal hypertrophy, G1DD, EF 60-65%,  mild AR, mild LAE   Pancreatic pseudocyst    Peyronie's disease    Prostate cancer [facility]) urologist-- dr / oncologist-- dr Tammi Klippel   first dx 08/ 2009 Gleason 3+3 active survelliance;  until bx 07/ 2021  Stage T2b, Gleason 4+3   Right inguinal hernia    Type 2 diabetes mellitus ([place])     followed by pcp---  (11-03-2020 per pt currently not checking blood sugar due to glucose monitor broken)   Wears glasses    Wears partial dentures    upper and lower    Past Surgical History:  Procedure Laterality Date   CHOLECYSTECTOMY N/A 11/26/2015  Procedure: LAPAROSCOPIC CHOLECYSTECTOMY;  Surgeon: Aviva Signs, MD;  Location: [facility];  Service: General;  Laterality: N/A;   COLONOSCOPY  last one 09-06-2020  dr stark   CYSTOSCOPY N/A 11/09/2020   Procedure: Erlene Quan;  Surgeon: Irine Seal, MD;  Location: [facility];  Service: Urology;  Laterality: N/A;   ESOPHAGOGASTRODUODENOSCOPY  10-06-2019  _0    FLEXIBLE BRONCHOSCOPY Bilateral 04/14/2016   Procedure: FLEXIBLE BRONCHOSCOPY;  Surgeon: Sinda Du, MD;  Location: [facility];  Service: Cardiopulmonary;  Laterality: Bilateral;   HEMORRHOID SURGERY  yrs ago   RADIOACTIVE SEED IMPLANT N/A 11/09/2020   Procedure: RADIOACTIVE SEED IMPLANT/BRACHYTHERAPY IMPLANT;  Surgeon: Irine Seal, MD;  Location: [facility];  Service: Urology;  Laterality: N/A;   SPACE OAR INSTILLATION N/A 11/09/2020   Procedure: SPACE OAR INSTILLATION;  Surgeon: Irine Seal, MD;  Location[facility];  Service: Urology;  Laterality: N/A;   UMBILICAL HERNIA REPAIR N/A 11/26/2015   Procedure: UMBILICAL HERNIORRHAPHY;  Surgeon: Aviva Signs, MD;  Location: [facility];  Service: General;  Laterality: N/A;    Social History   Socioeconomic History   Marital status: Married    Spouse name: [name]   Number of children: 4   Years of education: Not on file   Highest education level: 10th grade  Occupational History   Occupation: Retired     Fish farm manager: [employer]  Tobacco Use   Smoking status: Former    Years: 5.00    Types: Cigarettes    Quit date: 12/11/1986    Years since quitting: 35.1   Smokeless tobacco: Never  Vaping Use   Vaping Use: Never used  Substance and Sexual Activity   Alcohol use: No   Drug  use: Never   Sexual activity: Yes  Other Topics Concern   Not on file  Social History Narrative   Not on file   Social Determinants of Health   Financial Resource Strain: Low Risk    Difficulty of Paying Living Expenses: Not hard at all  Food Insecurity: No Food Insecurity   Worried About Charity fundraiser in the Last Year: Never true   [place] in the Last Year: Never true  Transportation Needs: No Transportation Needs   Lack of Transportation (Medical): No   Lack of Transportation (Non-Medical): No  Physical Activity: Inactive   Days of Exercise per Week: 0 days   Minutes of Exercise per Session: 0 min  Stress: Stress Concern Present   Feeling of Stress : To some extent  Social Connections: Socially Integrated   Frequency of Communication with Friends and Family: Three times a week   Frequency of Social Gatherings with Friends and Family: Three times a week   Attends Religious Services: More than 4 times per year   Active Member of Clubs or Organizations: Yes   Attends Music therapist: More than 4 times per year   Marital Status: Married  Human resources officer Violence: Not At Risk   Fear of Current or Ex-Partner: No   Emotionally Abused: No   Physically Abused: No   Sexually Abused: No    Family History  Problem Relation Age of Onset   Stroke Mother    Hypertension Mother    Alzheimer's disease Mother    Hypertension Father    Brain cancer Sister    Breast cancer Sister    Hypertension Sister    Deep vein thrombosis Brother    Hypertension Brother    Colon  cancer Neg Hx    Pancreatic disease Neg Hx    Esophageal cancer Neg Hx    Rectal cancer Neg Hx    Stomach cancer Neg Hx        Objective: Vitals:   01/12/22 1304  BP: (!) 142/65  Pulse: 61     Physical Exam Lab Results  Component Value Date   PSA1 382.0 (H) 01/12/2022   PSA1 66.7 (H) 11/22/2021   PSA1 53.2 (H) 11/17/2021    Lab Results:  Results for orders placed or  performed in visit on 01/12/22 (from the past 24 hour(s))  PSA     Status: Abnormal   Collection Time: 01/12/22  1:37 PM  Result Value Ref Range   Prostate Specific Ag, Serum 382.0 (H) 0.0 - 4.0 ng/mL   Narrative   Performed at:  [address] [place], [place]  [id number] Lab Director: Rush Farmer MD, Phone:  [phone]  Testosterone     Status: None   Collection Time: 01/12/22  1:37 PM  Result Value Ref Range   Testosterone 625 264 - 916 ng/dL   Narrative   Performed at:  [facility] [address] [place], [place]  [id number] Lab Director: Rush Farmer MD, Phone:  [phone]  Urinalysis, Routine w reflex microscopic     Status: Abnormal   Collection Time: 01/12/22  1:59 PM  Result Value Ref Range   Specific Gravity, UA 1.020 1.005 - 1.030   pH, UA 7.0 5.0 - 7.5   Color, UA Yellow Yellow   Appearance Ur Clear Clear   Leukocytes,UA Negative Negative   Protein,UA 1+ (A) Negative/Trace   Glucose, UA Negative Negative   Ketones, UA Trace (A) Negative   RBC, UA Trace (A) Negative   Bilirubin, UA Negative Negative   Urobilinogen, Ur 1.0 0.2 - 1.0 mg/dL   Nitrite, UA Negative Negative   Microscopic Examination See below:    Narrative   Performed at:  [address] [place] [address], [place], [place]  [id number] Lab Director: Mina Marble MT, Phone:  [phone]  Microscopic Examination     Status: None   Collection Time: 01/12/22  1:59 PM   Urine  Result Value Ref Range   WBC, UA None seen 0 - 5 /hpf   RBC 0-2 0 - 2 /hpf   Epithelial Cells (non renal) 0-10 0 - 10 /hpf   Renal Epithel, UA None seen None seen /hpf   Mucus, UA Present Not Estab.   Bacteria, UA None seen None seen/Few   Narrative   Performed at:  [facility] [address], [place], [place]  [id number] Lab Director: [place], Phone:  [phone]     BMET No results for input(s): NA, K, CL, CO2, GLUCOSE, BUN, CREATININE, CALCIUM in  the last 72 hours. PSA PSA  Date Value Ref Range Status  05/20/2020 6.4 (H) < OR = 4.0 ng/mL Final    Comment:    The total PSA value from this assay system is  standardized against the WHO standard. The test  result will be approximately 20% lower when compared  to the equimolar-standardized total PSA (Beckman  Coulter). Comparison of serial PSA results should be  interpreted with this fact in mind. . This test was performed using the Siemens  chemiluminescent method. Values obtained from  different assay methods cannot be used interchangeably. PSA levels, regardless of value, should not be interpreted as absolute evidence of the  presence or absence of disease.   11/12/2014 2.0 0.0 - 4.0 ng/mL Final    Comment:    Roche ECLIA methodology. According to the American Urological Association, Serum PSA should decrease and remain at undetectable levels after radical prostatectomy. The AUA defines biochemical recurrence as an initial PSA value 0.2 ng/mL or greater followed by a subsequent confirmatory PSA value 0.2 ng/mL or greater. Values obtained with different assay methods or kits cannot be used interchangeably. Results cannot be interpreted as absolute evidence of the presence or absence of malignant disease.    Testosterone  Date Value Ref Range Status  01/12/2022 625 264 - 916 ng/dL Final    Comment:    Adult male reference interval is based on a population of healthy nonobese males (BMI <30) between 43 and 79 years old. [place], [place] [phone]. PMID: [id number].     UA has 0-2 RBC's  Studies/Results: No results found. CT CHEST WO CONTRAST  Result Date: 01/04/2022 CLINICAL DATA:  Prostate cancer, assess treatment response. EXAM: CT CHEST WITHOUT CONTRAST TECHNIQUE: Multidetector CT imaging of the chest was performed following the standard protocol without IV contrast. RADIATION DOSE REDUCTION: This exam was performed according to the departmental  dose-optimization program which includes automated exposure control, adjustment of the mA and/or kV according to patient size and/or use of iterative reconstruction technique. COMPARISON:  Bone scan 12/08/2021, MR abdomen 12/08/2021, CT abdomen pelvis 11/25/2021 and CT chest 04/12/2016. FINDINGS: Cardiovascular: Atherosclerotic calcification of the aorta and coronary arteries. Pulmonic trunk is enlarged. Heart is at the upper limits of normal in size. No pericardial effusion. Mediastinum/Nodes: No pathologically enlarged mediastinal or axillary lymph nodes. Hilar regions are difficult to definitively evaluate without IV contrast. There may be distal esophageal wall thickening which can be seen with gastroesophageal reflux. Lungs/Pleura: Lungs are clear. No pleural fluid. Airway is unremarkable. Upper Abdomen: Visualized portions of the liver, adrenal glands, kidneys, spleen and pancreas are unremarkable. Small hiatal hernia. Haziness in the left upper quadrant, better seen on 11/25/2021. Musculoskeletal: Degenerative changes in the spine. No worrisome lytic or sclerotic lesions. IMPRESSION: 1. No CT correlate in the lower thoracic spine to correspond to the focal radiotracer uptake on bone scan 12/08/2021. No additional evidence of metastatic disease. 2. Haziness in the left upper quadrant, better evaluated on MR abdomen 12/08/2021. 3. Aortic atherosclerosis (ICD10-I70.0). Coronary artery calcification. 4. Enlarged pulmonic trunk, indicative of pulmonary arterial hypertension. Electronically Signed   By: Lorin Picket M.D.   On: 01/04/2022 11:46   NM Bone Scan Whole Body  Result Date: 12/09/2021 CLINICAL DATA:  Prostate cancer, monitor prostate cancer restage EXAM: NUCLEAR MEDICINE WHOLE BODY BONE SCAN TECHNIQUE: Whole body anterior and posterior images were obtained approximately 3 hours after intravenous injection of radiopharmaceutical. RADIOPHARMACEUTICALS:  21 mCi Technetium-2mMDP IV COMPARISON:  Bone  scan 07/06/2020 FINDINGS: There is new focal radiotracer uptake within the right aspect of the a lower thoracic vertebral body likely within the posterior elements. 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 There is a large right inguinal hernia with radiotracer accumulating in a linear and irregular pattern. Additional mild radiotracer uptake within the bilateral shoulders, elbows, wrists, knees, ankles, and spine consistent with arthritis. IMPRESSION: New focal radiotracer uptake within the right aspect of a lower thoracic vertebral body, possibly within the posterior elements concerning for metastasis. Recommend chest CT. 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 Large right inguinal hernia. Electronically Signed   By: Maurine Simmering M.D.   On: 12/09/2021 15:02   CT Abdomen Pelvis W Contrast  Result Date: 11/25/2021 CLINICAL DATA:  Prostate cancer EXAM: CT ABDOMEN AND PELVIS WITH CONTRAST TECHNIQUE: Multidetector CT imaging of the abdomen and pelvis was performed using the standard protocol following bolus administration of intravenous contrast. CONTRAST:  162m OMNIPAQUE IOHEXOL 300 MG/ML  SOLN COMPARISON:  CT abdomen and pelvis with contrast dated July 29, 2020 FINDINGS: Lower chest: No acute abnormality. Hepatobiliary: No suspicious liver lesions. Cholecystectomy clips. Mild biliary ductal dilation, not unexpected status post cholecystectomy. Pancreas: Atrophy of the pancreatic body and tail with unchanged dilation of the main pancreatic duct measuring up to 6 mm at the pancreatic neck. Interval decreased size of pancreatic duodenal groove fluid collection, measuring to 7 mm on series 2, image 36, previously measured up to 2.3 mm. Compared to prior exam there is increased ill-defined soft tissue about  the proximal splenic artery and pancreatic body, for example series 2, image 27 measuring approximately 2.7 x 2.2 cm. Splenic vein and SMV occlusion with numerous collaterals of the upper abdomen, similar to prior exam. Spleen: Normal in size without focal abnormality. Adrenals/Urinary Tract: Bilateral adrenal glands are unremarkable. Kidneys enhance symmetrically with no evidence of hydronephrosis or nephrolithiasis. Bladder wall thickening. Stomach/Bowel: Small hiatal hernia. Normal appendix. Diverticulosis. Mild duodenal wall thickening. No evidence of obstruction. Vascular/Lymphatic: Aortic atherosclerosis. No enlarged abdominal or pelvic lymph nodes. Reproductive: Prostatomegaly with numerous brachytherapy seeds. Other: Large right inguinal hernia containing fluid and nondilated loops of bowel. Musculoskeletal: No acute or significant osseous findings. IMPRESSION: 1. No evidence of metastatic disease in the abdomen or pelvis. 2. New ill-defined soft tissue is seen adjacent to the pancreatic body and encasing the proximal splenic artery, possibly sequela of interval pancreatitis, although pancreatic neoplasm could have a similar appearance. Recommend further evaluation with contrast-enhanced MRCP. 3. 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 4.  Aortic Atherosclerosis (ICD10-I70.0). Electronically Signed   By: LYetta GlassmanM.D.   On: 11/25/2021 17:18   MR 3D Recon At Scanner  Result Date: 12/09/2021 CLINICAL DATA:  Indeterminate peripancreatic soft tissue on recent CT study. History of necrotizing pancreatitis. History of prostate cancer with rising PSA. EXAM: MRI ABDOMEN WITHOUT AND WITH CONTRAST (INCLUDING MRCP) TECHNIQUE: Multiplanar multisequence MR imaging of the abdomen was performed both before and after the administration of intravenous contrast. Heavily T2-weighted images of the biliary and pancreatic  ducts were obtained, and three-dimensional MRCP images were rendered by post processing. CONTRAST:  761mGADAVIST GADOBUTROL 1 MMOL/ML IV SOLN COMPARISON:  11/25/2021 CT abdomen/pelvis. FINDINGS: Lower chest: No acute abnormality at the lung bases. Hepatobiliary: Normal liver size and configuration. No hepatic steatosis. A few scattered small indistinct arterial phase foci of hyperenhancement at the periphery of the liver (for example on series 18/image 53 in the inferior right liver) are occult on all other sequences, most compatible with benign transient perfusional phenomena. No liver masses. Cholecystectomy. No significant intrahepatic biliary ductal dilatation. Common bile duct diameter 5 mm. Chronic smooth narrowing of the mid to distal common bile duct in the region of the pancreatic head. No choledocholithiasis. No biliary masses or beading. Pancreas: Chronic atrophy of the pancreatic body and tail with associated dilated pancreatic duct (7 mm diameter) with abrupt pancreatic duct caliber transition at the level of the pancreatic neck. 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 Persistent ill-defined enhancing soft tissue between the proximal stomach and pancreatic body encasing the proximal splenic artery, spanning approximately 4.6 x 2.6 cm (series 8/image 21) with associated restricted diffusion (series 12/images 13-15) and heterogeneous T2 hypointensity, similar in extent to the 11/25/2021 CT abdomen/pelvis study, largely new since 07/29/2020 CT. No peripancreatic fluid collections. No pancreas divisum. Spleen: Normal size. No mass. Adrenals/Urinary Tract: Normal adrenals. No hydronephrosis. Subcentimeter simple renal cortical cysts scattered in both kidneys. No suspicious renal masses. Stomach/Bowel: Small hiatal hernia. Otherwise normal nondistended stomach. Visualized small and  large bowel is normal caliber, with no bowel wall thickening. Moderate left colonic diverticulosis. Partially visualized bowel containing large right inguinal hernia. Vascular/Lymphatic: Atherosclerotic nonaneurysmal abdominal aorta. Patent hepatic and renal veins. No pathologically enlarged lymph nodes in the abdomen. Other: No abdominal ascites or focal fluid collection. Musculoskeletal: Several large heterogeneously enhancing bone lesions scattered throughout the visualized pelvis, with representative 6.9 cm right superior acetabular lesion (series 24/image 38), 5.2 cm superior left sacral ala lesion (series 24/image 22), 2.5 cm superior right sacral ala lesion (series 24/image 22) and posteromedial superior right iliac bone 3.0 cm lesion (series 24/image 11). IMPRESSION: 1. 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 2. 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 Chronic atrophy and duct dilation in the pancreatic body and tail. 3. Persistent indeterminate ill-defined T2 hypointense enhancing soft tissue located between the proximal stomach and pancreatic body, spanning approximately 4.6 x 2.6 cm, similar in extent to the 11/25/2021 CT abdomen/pelvis study, largely new since 11/25/2021 CT. Differential includes progressive fibrosis versus pancreatic neoplasm. Suggest GI consultation for consideration of upper endoscopic ultrasound. 4. Bile ducts are stable and within normal post cholecystectomy limits with CBD diameter 5 mm. 5. Small hiatal hernia. 6. Moderate left colonic diverticulosis. 7. Partially visualized large bowel containing right inguinal hernia. Electronically Signed   By: Ilona Sorrel M.D.   On: 12/09/2021 10:01   MR ABDOMEN MRCP W WO  CONTAST  Result Date: 12/09/2021 CLINICAL DATA:  Indeterminate peripancreatic soft tissue on recent CT study. History of necrotizing pancreatitis. History of prostate cancer with rising PSA. EXAM: MRI ABDOMEN WITHOUT AND WITH CONTRAST (INCLUDING MRCP) TECHNIQUE: Multiplanar multisequence MR imaging of the abdomen was performed both before and after the administration of intravenous contrast. Heavily T2-weighted images of the biliary and pancreatic ducts were obtained, and three-dimensional MRCP images were rendered by post processing. CONTRAST:  20m GADAVIST GADOBUTROL 1 MMOL/ML IV SOLN COMPARISON:  11/25/2021 CT abdomen/pelvis. FINDINGS: Lower chest: No acute abnormality at the lung bases. Hepatobiliary: Normal liver size and configuration. No hepatic steatosis. A few scattered small indistinct arterial phase foci of hyperenhancement at the periphery of the liver (for example on series 18/image 53 in the inferior right liver) are occult on all other sequences, most compatible with benign transient perfusional phenomena. No liver masses. Cholecystectomy. No significant intrahepatic biliary ductal dilatation. Common bile duct diameter 5 mm. Chronic smooth narrowing of the mid to distal common bile duct in the region of the pancreatic head. No choledocholithiasis. No biliary masses or beading. Pancreas: Chronic atrophy of the pancreatic body and tail with associated dilated pancreatic duct (7 mm diameter) with abrupt  pancreatic duct caliber transition at the level of the pancreatic neck. 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 Persistent ill-defined enhancing soft tissue between the proximal stomach and pancreatic body encasing the proximal splenic artery, spanning approximately 4.6 x 2.6 cm (series 8/image 21) with associated restricted diffusion (series 12/images 13-15) and heterogeneous  T2 hypointensity, similar in extent to the 11/25/2021 CT abdomen/pelvis study, largely new since 07/29/2020 CT. No peripancreatic fluid collections. No pancreas divisum. Spleen: Normal size. No mass. Adrenals/Urinary Tract: Normal adrenals. No hydronephrosis. Subcentimeter simple renal cortical cysts scattered in both kidneys. No suspicious renal masses. Stomach/Bowel: Small hiatal hernia. Otherwise normal nondistended stomach. Visualized small and large bowel is normal caliber, with no bowel wall thickening. Moderate left colonic diverticulosis. Partially visualized bowel containing large right inguinal hernia. Vascular/Lymphatic: Atherosclerotic nonaneurysmal abdominal aorta. Patent hepatic and renal veins. No pathologically enlarged lymph nodes in the abdomen. Other: No abdominal ascites or focal fluid collection. Musculoskeletal: Several large heterogeneously enhancing bone lesions scattered throughout the visualized pelvis, with representative 6.9 cm right superior acetabular lesion (series 24/image 38), 5.2 cm superior left sacral ala lesion (series 24/image 22), 2.5 cm superior right sacral ala lesion (series 24/image 22) and posteromedial superior right iliac bone 3.0 cm lesion (series 24/image 11). IMPRESSION: 1. 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 2. 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 Chronic atrophy and duct dilation in the pancreatic body and tail. 3. Persistent indeterminate ill-defined T2 hypointense enhancing soft tissue located between the proximal stomach and pancreatic body, spanning approximately 4.6 x 2.6 cm, similar in extent to the 11/25/2021 CT abdomen/pelvis study, largely new since 11/25/2021 CT. Differential  includes progressive fibrosis versus pancreatic neoplasm. Suggest GI consultation for consideration of upper endoscopic ultrasound. 4. Bile ducts are stable and within normal post cholecystectomy limits with CBD diameter 5 mm. 5. Small hiatal hernia. 6. Moderate left colonic diverticulosis. 7. Partially visualized large bowel containing right inguinal hernia. Electronically Signed   By: Ilona Sorrel M.D.   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   I initiated ADT with firmagon today and reviewing the side effects and I will get him set up for an oncology consultation for second line ADT and Bone targeted therapy.   PSA and T obtained today.  F/u in 4-5 weeks for next injection.  I will continue firmagon because of the cardiac history.   Hip pain.  This is no longer bothering him.    Nodular prostate with obstruction and nocturia with some increased urgency.  I will stop the finasteride with the initiation of ADT.Marland Kitchen    Hematuria.  He has had no further bleeding.  His INR was 2.3 on last check.    Meds ordered this encounter  Medications   degarelix (FIRMAGON) injection 240 mg      Orders Placed This Encounter  Procedures   Microscopic Examination   Urinalysis, Routine w reflex microscopic   PSA    Standing Status:   Future    Standing Expiration Date:   05/12/2022   Testosterone    Standing Status:  Future    Standing Expiration Date:   04/11/2022   PSA   Testosterone   Ambulatory referral to Hematology / Oncology    Referral Priority:   Urgent    Referral Type:   Consultation    Referral Reason:   Specialty Services Required    Requested Specialty:   Oncology    Number of Visits Requested:   1      Return in about 5 weeks (around 02/16/2022) for for firmagon with PSA and testosterone levels. .   CC: Dettinger, Fransisca Kaufmann, MD      Irine Seal 01/13/2022 Patient ID: [name], male   DOB: 23-Dec-1940, 81 y.o.   MRN: [id number]

## 2022-01-13 LAB — URINALYSIS, ROUTINE W REFLEX MICROSCOPIC
Bilirubin, UA: NEGATIVE
Glucose, UA: NEGATIVE
Leukocytes,UA: NEGATIVE
Nitrite, UA: NEGATIVE
Specific Gravity, UA: 1.02 (ref 1.005–1.030)
Urobilinogen, Ur: 1 mg/dL (ref 0.2–1.0)
pH, UA: 7 (ref 5.0–7.5)

## 2022-01-13 LAB — MICROSCOPIC EXAMINATION
Bacteria, UA: NONE SEEN
Renal Epithel, UA: NONE SEEN /hpf
WBC, UA: NONE SEEN /hpf (ref 0–5)

## 2022-01-13 LAB — TESTOSTERONE: Testosterone: 625 ng/dL (ref 264–916)

## 2022-01-13 LAB — PSA: Prostate Specific Ag, Serum: 382 ng/mL — ABNORMAL HIGH (ref 0.0–4.0)

## 2022-01-16 ENCOUNTER — Other Ambulatory Visit (HOSPITAL_COMMUNITY): Payer: Self-pay

## 2022-01-16 DIAGNOSIS — C61 Malignant neoplasm of prostate: Secondary | ICD-10-CM

## 2022-01-16 NOTE — Progress Notes (Signed)
Order placed for PSMA PET scan per Dr. Delton Coombes.

## 2022-01-17 ENCOUNTER — Other Ambulatory Visit: Payer: Self-pay | Admitting: Family Medicine

## 2022-01-17 ENCOUNTER — Other Ambulatory Visit: Payer: Self-pay | Admitting: Interventional Cardiology

## 2022-01-17 DIAGNOSIS — E1159 Type 2 diabetes mellitus with other circulatory complications: Secondary | ICD-10-CM

## 2022-01-17 DIAGNOSIS — I152 Hypertension secondary to endocrine disorders: Secondary | ICD-10-CM

## 2022-01-25 ENCOUNTER — Encounter (HOSPITAL_COMMUNITY): Payer: Self-pay

## 2022-01-25 NOTE — Progress Notes (Signed)
Attempted to reach patient today for introductory phone call. Unable to reach patient, spoke with patient's wife. I introduced myself and explained my role in the patient's care. Advised wife of clinic location and visitor policy. No additional questions at this time.

## 2022-01-26 ENCOUNTER — Encounter (HOSPITAL_COMMUNITY): Payer: Self-pay | Admitting: Hematology

## 2022-01-26 ENCOUNTER — Inpatient Hospital Stay (HOSPITAL_COMMUNITY): Payer: No Typology Code available for payment source | Attending: Hematology | Admitting: Hematology

## 2022-01-26 ENCOUNTER — Other Ambulatory Visit (HOSPITAL_COMMUNITY): Payer: Self-pay

## 2022-01-26 ENCOUNTER — Other Ambulatory Visit: Payer: Self-pay

## 2022-01-26 ENCOUNTER — Inpatient Hospital Stay (HOSPITAL_COMMUNITY): Payer: No Typology Code available for payment source

## 2022-01-26 VITALS — BP 139/61 | HR 63 | Temp 98.8°F | Resp 18 | Ht 65.0 in | Wt 158.7 lb

## 2022-01-26 DIAGNOSIS — M549 Dorsalgia, unspecified: Secondary | ICD-10-CM | POA: Insufficient documentation

## 2022-01-26 DIAGNOSIS — Z86010 Personal history of colonic polyps: Secondary | ICD-10-CM

## 2022-01-26 DIAGNOSIS — I4891 Unspecified atrial fibrillation: Secondary | ICD-10-CM | POA: Diagnosis not present

## 2022-01-26 DIAGNOSIS — E782 Mixed hyperlipidemia: Secondary | ICD-10-CM

## 2022-01-26 DIAGNOSIS — K55069 Acute infarction of intestine, part and extent unspecified: Secondary | ICD-10-CM | POA: Diagnosis not present

## 2022-01-26 DIAGNOSIS — E119 Type 2 diabetes mellitus without complications: Secondary | ICD-10-CM | POA: Diagnosis not present

## 2022-01-26 DIAGNOSIS — Z87891 Personal history of nicotine dependence: Secondary | ICD-10-CM

## 2022-01-26 DIAGNOSIS — K5900 Constipation, unspecified: Secondary | ICD-10-CM | POA: Diagnosis not present

## 2022-01-26 DIAGNOSIS — Z86718 Personal history of other venous thrombosis and embolism: Secondary | ICD-10-CM

## 2022-01-26 DIAGNOSIS — C61 Malignant neoplasm of prostate: Secondary | ICD-10-CM | POA: Diagnosis not present

## 2022-01-26 DIAGNOSIS — K449 Diaphragmatic hernia without obstruction or gangrene: Secondary | ICD-10-CM

## 2022-01-26 DIAGNOSIS — K861 Other chronic pancreatitis: Secondary | ICD-10-CM

## 2022-01-26 DIAGNOSIS — Z79899 Other long term (current) drug therapy: Secondary | ICD-10-CM

## 2022-01-26 DIAGNOSIS — I10 Essential (primary) hypertension: Secondary | ICD-10-CM

## 2022-01-26 DIAGNOSIS — Z818 Family history of other mental and behavioral disorders: Secondary | ICD-10-CM

## 2022-01-26 DIAGNOSIS — Z803 Family history of malignant neoplasm of breast: Secondary | ICD-10-CM | POA: Diagnosis not present

## 2022-01-26 DIAGNOSIS — R197 Diarrhea, unspecified: Secondary | ICD-10-CM | POA: Diagnosis not present

## 2022-01-26 DIAGNOSIS — G479 Sleep disorder, unspecified: Secondary | ICD-10-CM | POA: Insufficient documentation

## 2022-01-26 DIAGNOSIS — Z7901 Long term (current) use of anticoagulants: Secondary | ICD-10-CM

## 2022-01-26 DIAGNOSIS — Z7952 Long term (current) use of systemic steroids: Secondary | ICD-10-CM | POA: Diagnosis not present

## 2022-01-26 DIAGNOSIS — K219 Gastro-esophageal reflux disease without esophagitis: Secondary | ICD-10-CM | POA: Diagnosis not present

## 2022-01-26 DIAGNOSIS — Z8719 Personal history of other diseases of the digestive system: Secondary | ICD-10-CM

## 2022-01-26 DIAGNOSIS — Z808 Family history of malignant neoplasm of other organs or systems: Secondary | ICD-10-CM

## 2022-01-26 DIAGNOSIS — M545 Low back pain, unspecified: Secondary | ICD-10-CM | POA: Diagnosis not present

## 2022-01-26 DIAGNOSIS — Z7984 Long term (current) use of oral hypoglycemic drugs: Secondary | ICD-10-CM

## 2022-01-26 DIAGNOSIS — Z823 Family history of stroke: Secondary | ICD-10-CM

## 2022-01-26 DIAGNOSIS — Z9049 Acquired absence of other specified parts of digestive tract: Secondary | ICD-10-CM

## 2022-01-26 DIAGNOSIS — I700 Atherosclerosis of aorta: Secondary | ICD-10-CM

## 2022-01-26 DIAGNOSIS — C7951 Secondary malignant neoplasm of bone: Secondary | ICD-10-CM | POA: Diagnosis not present

## 2022-01-26 DIAGNOSIS — I480 Paroxysmal atrial fibrillation: Secondary | ICD-10-CM

## 2022-01-26 DIAGNOSIS — Z8249 Family history of ischemic heart disease and other diseases of the circulatory system: Secondary | ICD-10-CM

## 2022-01-26 LAB — CBC WITH DIFFERENTIAL/PLATELET
Abs Immature Granulocytes: 0.01 10*3/uL (ref 0.00–0.07)
Basophils Absolute: 0 10*3/uL (ref 0.0–0.1)
Basophils Relative: 0 %
Eosinophils Absolute: 0.1 10*3/uL (ref 0.0–0.5)
Eosinophils Relative: 1 %
HCT: 30 % — ABNORMAL LOW (ref 39.0–52.0)
Hemoglobin: 9.5 g/dL — ABNORMAL LOW (ref 13.0–17.0)
Immature Granulocytes: 0 %
Lymphocytes Relative: 22 %
Lymphs Abs: 1.2 10*3/uL (ref 0.7–4.0)
MCH: 30 pg (ref 26.0–34.0)
MCHC: 31.7 g/dL (ref 30.0–36.0)
MCV: 94.6 fL (ref 80.0–100.0)
Monocytes Absolute: 0.4 10*3/uL (ref 0.1–1.0)
Monocytes Relative: 8 %
Neutro Abs: 3.7 10*3/uL (ref 1.7–7.7)
Neutrophils Relative %: 69 %
Platelets: 266 10*3/uL (ref 150–400)
RBC: 3.17 MIL/uL — ABNORMAL LOW (ref 4.22–5.81)
RDW: 14.1 % (ref 11.5–15.5)
WBC: 5.4 10*3/uL (ref 4.0–10.5)
nRBC: 0 % (ref 0.0–0.2)

## 2022-01-26 LAB — COMPREHENSIVE METABOLIC PANEL
ALT: 18 U/L (ref 0–44)
AST: 27 U/L (ref 15–41)
Albumin: 3.5 g/dL (ref 3.5–5.0)
Alkaline Phosphatase: 392 U/L — ABNORMAL HIGH (ref 38–126)
Anion gap: 13 (ref 5–15)
BUN: 29 mg/dL — ABNORMAL HIGH (ref 8–23)
CO2: 21 mmol/L — ABNORMAL LOW (ref 22–32)
Calcium: 9 mg/dL (ref 8.9–10.3)
Chloride: 102 mmol/L (ref 98–111)
Creatinine, Ser: 1.02 mg/dL (ref 0.61–1.24)
GFR, Estimated: >60 mL/min (ref >60)
Glucose, Bld: 122 mg/dL — ABNORMAL HIGH (ref 70–99)
Potassium: 4.2 mmol/L (ref 3.5–5.1)
Sodium: 136 mmol/L (ref 135–145)
Total Bilirubin: 0.7 mg/dL (ref 0.3–1.2)
Total Protein: 7.3 g/dL (ref 6.5–8.1)

## 2022-01-26 LAB — PSA: Prostatic Specific Antigen: 72.3 ng/mL — ABNORMAL HIGH (ref 0.00–4.00)

## 2022-01-26 LAB — GENETIC SCREENING ORDER

## 2022-01-26 MED ORDER — ABIRATERONE ACETATE 250 MG PO TABS
750.0000 mg | ORAL_TABLET | Freq: Every day | ORAL | 0 refills | Status: DC
  Filled 2022-01-26: qty 90, 30d supply, fill #0

## 2022-01-26 MED ORDER — PREDNISONE 5 MG PO TABS: 5.0000 mg | ORAL_TABLET | Freq: Every day | ORAL | 0 refills | Status: DC

## 2022-01-26 NOTE — Patient Instructions (Addendum)
[place] at [facility] Discharge Instructions  You were seen and examined today by Dr. Delton Coombes. Dr. Delton Coombes is a medical oncologist, meaning that he specializes in the treatment of cancer diagnoses. Dr. Delton Coombes discussed your past medical history, family history of cancers, and the events that led to you being here today.  You were referred to Dr. Delton Coombes due to your rising PSA level. Your recent scan revealed something questionable around your pancreas but also that the cancer has spread to the bone. This is concerning because cancer weakens the bone and allows the bones to fracture more easily. It is recommended that you start Xgeva to protect your bones from breaking. This will not be started until after you see your dentist again.  Proceed with your PET scan tomorrow.  Dr. Delton Coombes has also recommended additional lab work today to check your routine labs as well as recheck your PSA. You should also have genetic testing done. This is a blood test which would then be discussed with a Dietitian.  Dr. Delton Coombes also discussed Zytiga. This is a pill for prostate cancer given once daily. This works to control the prostate cancer. You will be on it as long as it helps to control your prostate cancer. You will take it with prednisone. Do not start these medications until you meet with Dr. Delton Coombes to discuss the PET scan results.  Follow-up with Dr. Delton Coombes as scheduled.   Thank you for choosing [place] at [facility] to provide your oncology and hematology care.  To afford each patient quality time with our provider, please arrive at least 15 minutes before your scheduled appointment time.   If you have a lab appointment with the [facility] please come in thru the Main Entrance and check in at the main information desk.  You need to re-schedule your appointment should you arrive 10 or more minutes late.  We strive to  give you quality time with our providers, and arriving late affects you and other patients whose appointments are after yours.  Also, if you no show three or more times for appointments you may be dismissed from the clinic at the providers discretion.     Again, thank you for choosing [facility].  Our hope is that these requests will decrease the amount of time that you wait before being seen by our physicians.       _____________________________________________________________  Should you have questions after your visit to [facility], please contact our office at [phone] and follow the prompts.  Our office hours are 8:00 a.m. and 4:30 p.m. Monday - Friday.  Please note that voicemails left after 4:00 p.m. may not be returned until the following business day.  We are closed weekends and major holidays.  You do have access to a nurse 24-7, just call the main number to the clinic [phone] and do not press any options, hold on the line and a nurse will answer the phone.    For prescription refill requests, have your pharmacy contact our office and allow 72 hours.    Due to Covid, you will need to wear a mask upon entering the hospital. If you do not have a mask, a mask will be given to you at the Main Entrance upon arrival. For doctor visits, patients may have 1 support person age 65 or older with them. For treatment visits, patients can not have anyone with them due to  social distancing guidelines and our immunocompromised population.

## 2022-01-26 NOTE — Progress Notes (Signed)
Alan [name], [place] [id number]   CLINIC:  Medical Oncology/Hematology  CONSULT NOTE  Patient Care Team: Dettinger, Fransisca Kaufmann, MD as PCP - General (Family Medicine) Belva Crome, MD as PCP - Cardiology (Cardiology) Gala Romney Cristopher Estimable, MD as Consulting Physician (Gastroenterology) Irine Seal, MD as Attending Physician (Urology) Dickie La, RN (Inactive) as [place] Management Hudy, Delice Bison, RN as Registered Nurse Shea Evans, Norva Riffle, LCSW as [place] [employer] (Licensed Clinical Social Worker) Blanca Friend, Royce Macadamia, [facility] (Pharmacist)  CHIEF COMPLAINTS/PURPOSE OF CONSULTATION:  Evaluation of prostate cancer  HISTORY OF PRESENTING ILLNESS:  [name] 81 y.o. male is here because of evaluation of prostate cancer, at the request of AUR.  Today he reports feeling good, and he is accompanied by his niece. He denies new pains, fevers, night sweats, and weight loss. He reports his activity levels are at baseline, and he is able to do his typical activities without limitations. He denies history of seizures. He has a history of DM well controlled with metformin. He denies history of CVA and MI. He had 1 unprovoked DVT 8 years ago and a history of A-fib for which he takes Coumadin. He denies bone pains. His daughter reports that he occasional has difficulty with memory. His appetite is good, and he denies nausea and vomiting.   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 He denies chemical exposure. He quit smoking 50 years ago. Two sisters had brain cancer, his niece has breast cancer, and his maternal grandmother had cancer.   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    found during hospital stay with pancreatitis,  started coumadin;   last Abd CT in epic 07-29-2020  chronic thrombus portal venous   GERD (gastroesophageal reflux disease)    Heart murmur    History of acute pancreatitis    2016   necrotizing pancreatitis   History of adenomatous polyp of colon    History of DVT of lower extremity    History of GI bleed 08/2019   upper gi bleed due to duodenal ulcer   History of gout yrs ago   Hypertension    followed by pcp   Hypertrophic cardiomyopathy ([place])    followed by cardiology   Internal hemorrhoids    Mixed hyperlipidemia    Normocytic anemia    PAF (paroxysmal atrial fibrillation) ([place]) 04/2016   cardiologist-- dr h. Tamala Julian--  event monitor 07-04-2017 epic, NSR/ PAflutter with occasional RVR/  PACs/ PVCs ;  echo 05-16-2017 epic,  moderate LVH with severe asymptomic septal hypertrophy, G1DD, EF 60-65%,  mild AR, mild LAE   Pancreatic pseudocyst    Peyronie's disease    Prostate cancer [facility]) urologist-- dr wrenn/ oncologist-- dr Tammi Klippel   first dx 08/ 2009 Gleason 3+3 active survelliance;  until bx 07/ 2021  Stage T2b, Gleason 4+3   Right inguinal hernia    Type 2 diabetes mellitus ([place])    followed by pcp---  (11-03-2020 per pt currently not checking blood sugar due to glucose monitor broken)   Wears glasses    Wears partial dentures    upper and lower    SURGICAL HISTORY:  Past Surgical History:  Procedure Laterality Date   CHOLECYSTECTOMY N/A 11/26/2015   Procedure: LAPAROSCOPIC CHOLECYSTECTOMY;  Surgeon: Aviva Signs, MD;  Location: [facility];  Service: General;  Laterality: N/A;   COLONOSCOPY  last one 09-06-2020  dr stark   CYSTOSCOPY N/A 11/09/2020   Procedure: Erlene Quan;  Surgeon: Irine Seal, MD;  Location: [facility];  Service: Urology;  Laterality: N/A;   ESOPHAGOGASTRODUODENOSCOPY  10-06-2019  '@[facility]'    FLEXIBLE BRONCHOSCOPY Bilateral 04/14/2016   Procedure: FLEXIBLE BRONCHOSCOPY;  Surgeon: Sinda Du,  MD;  Location: [facility];  Service: Cardiopulmonary;  Laterality: Bilateral;   HEMORRHOID SURGERY  yrs ago   RADIOACTIVE SEED IMPLANT N/A 11/09/2020   Procedure: RADIOACTIVE SEED IMPLANT/BRACHYTHERAPY IMPLANT;  Surgeon: Irine Seal, MD;  Location: [facility];  Service: Urology;  Laterality: N/A;   SPACE OAR INSTILLATION N/A 11/09/2020   Procedure: SPACE OAR INSTILLATION;  Surgeon: Irine Seal, MD;  [facility];  Service: Urology;  Laterality: N/A;   UMBILICAL HERNIA REPAIR N/A 11/26/2015   Procedure: UMBILICAL HERNIORRHAPHY;  Surgeon: Aviva Signs, MD;  Location: [facility];  Service: General;  Laterality: N/A;    SOCIAL HISTORY: Social History   Socioeconomic History   Marital status: Married    Spouse name: [name]   Number of children: 4   Years of education: Not on file   Highest education level: 10th grade  Occupational History   Occupation: Retired     Fish farm manager: [employer]  Tobacco Use   Smoking status: Former    Years: 5.00    Types: Cigarettes    Quit date: 12/11/1986    Years since quitting: 35.1   Smokeless tobacco: Never  Vaping Use   Vaping Use: Never used  Substance and Sexual Activity   Alcohol use: No   Drug use: Never   Sexual activity: Yes  Other Topics Concern   Not on file  Social History Narrative   Not on file   Social Determinants of Health   Financial Resource Strain: Low Risk    Difficulty of Paying Living Expenses: Not hard at all  Food Insecurity: No Food Insecurity   Worried About Charity fundraiser in the Last Year: Never true   [place] in the Last Year: Never true  Transportation Needs: No Transportation Needs   Lack of Transportation (Medical): No   Lack of Transportation (Non-Medical): No  Physical Activity: Inactive   Days of Exercise per Week: 0 days   Minutes of Exercise per Session: 0 min  Stress: Stress Concern Present   Feeling of Stress : To some extent  Social Connections:  Socially Integrated   Frequency of Communication with Friends and Family: Three times a week   Frequency of Social Gatherings with Friends and Family: Three times a week   Attends Religious Services: More than 4 times per year   Active Member of Clubs or Organizations: Yes   Attends Music therapist: More than 4 times per year   Marital Status: Married  Human resources officer Violence: Not At Risk   Fear of Current or Ex-Partner: No   Emotionally Abused: No   Physically Abused: No   Sexually Abused: No    FAMILY HISTORY: Family History  Problem Relation Age of Onset   Stroke Mother    Hypertension Mother    Alzheimer's disease Mother    Hypertension Father    Brain cancer Sister    Breast cancer Sister  Hypertension Sister    Deep vein thrombosis Brother    Hypertension Brother    Colon cancer Neg Hx    Pancreatic disease Neg Hx    Esophageal cancer Neg Hx    Rectal cancer Neg Hx    Stomach cancer Neg Hx     ALLERGIES:  is allergic to allopurinol and colchicine.  MEDICATIONS:  Current Outpatient Medications  Medication Sig Dispense Refill   abiraterone acetate (ZYTIGA) 250 MG tablet Take 3 tablets (750 mg total) by mouth daily. Take on an empty stomach 1 hour before or 2 hours after a meal 90 tablet 0   acetaminophen (TYLENOL) 500 MG tablet Take 500 mg by mouth every 6 (six) hours as needed.     amLODipine (NORVASC) 10 MG tablet TAKE 1 TABLET BY MOUTH ONCE DAILY IN THE EVENING 90 tablet 0   Blood Glucose Monitoring Suppl (ONE TOUCH ULTRA MINI) w/Device KIT 1 each by Does not apply route 2 (two) times daily. 1 kit 1   chlorthalidone (HYGROTON) 25 MG tablet Take 1 tablet (25 mg total) by mouth daily. Please make overdue appt with Dr. Tamala Julian before anymore refills. Thank you 1st attempt 30 tablet 0   FEROSUL 325 (65 Fe) MG tablet Take 1 tablet by mouth once daily with breakfast 90 tablet 0   fluticasone (FLONASE) 50 MCG/ACT nasal spray Place 1 spray into both  nostrils 2 (two) times daily as needed for allergies or rhinitis. 16 g 6   glucose blood (ONE TOUCH ULTRA TEST) test strip Use to check BG once daily.  Dx:  Type 2 DM controlled E11.9 100 each 4   lisinopril (ZESTRIL) 20 MG tablet TAKE 1 & 1/2 (ONE & ONE-HALF) TABLETS BY MOUTH ONCE DAILY 135 tablet 0   metFORMIN (GLUCOPHAGE) 1000 MG tablet Take 1 tablet (1,000 mg total) by mouth 2 (two) times daily with a meal. 180 tablet 3   metoprolol succinate (TOPROL-XL) 100 MG 24 hr tablet TAKE 1 TABLET BY MOUTH ONCE DAILY WITH OR IMMEDIATELY FOLLOWING A MEAL 90 tablet 0   Multiple Vitamins-Minerals (CENTRUM SILVER 50+MEN) TABS Take by mouth daily.     omeprazole (PRILOSEC) 20 MG capsule Take 1 capsule (20 mg total) by mouth daily. 90 capsule 3   potassium chloride SA (KLOR-CON) 20 MEQ tablet TAKE 1  BY MOUTH ONCE DAILY 90 tablet 0   pravastatin (PRAVACHOL) 40 MG tablet Take 1 tablet (40 mg total) by mouth every evening. 90 tablet 3   predniSONE (DELTASONE) 5 MG tablet Take 1 tablet (5 mg total) by mouth daily with breakfast. 30 tablet 0   Sodium Sulfate-Mag Sulfate-KCl (SUTAB) [phone] MG TABS Take by mouth as needed.      warfarin (COUMADIN) 5 MG tablet TAKE 1 & 1/2 (ONE & ONE-HALF) TABLETS BY MOUTH ONCE DAILY 6 IN THE EVENING 90 tablet 3   No current facility-administered medications for this visit.    REVIEW OF SYSTEMS:   Review of Systems  Constitutional:  Negative for appetite change, fatigue, fever and unexpected weight change.  Gastrointestinal:  Positive for constipation and diarrhea. Negative for nausea and vomiting.  Endocrine: Negative for hot flashes.  Musculoskeletal:  Negative for arthralgias.  Psychiatric/Behavioral:  Positive for sleep disturbance.   All other systems reviewed and are negative.   PHYSICAL EXAMINATION: ECOG PERFORMANCE STATUS: 1 - Symptomatic but completely ambulatory  Vitals:   01/26/22 1339  BP: 139/61  Pulse: 63  Resp: 18  Temp: 98.8 F (37.1 C)  SpO2:  100%   Filed Weights   01/26/22 1339  Weight: 158 lb 11.7 oz (72 kg)   Physical Exam Vitals reviewed.  Constitutional:      Appearance: Normal appearance.  Cardiovascular:     Rate and Rhythm: Normal rate. Rhythm irregular.     Pulses: Normal pulses.     Heart sounds: Normal heart sounds.  Pulmonary:     Effort: Pulmonary effort is normal.     Breath sounds: Normal breath sounds.  Abdominal:     Palpations: Abdomen is soft. There is no mass.     Tenderness: There is no abdominal tenderness.  Musculoskeletal:     Right lower leg: No edema.     Left lower leg: No edema.  Lymphadenopathy:     Cervical: No cervical adenopathy.     Right cervical: No superficial cervical adenopathy.    Left cervical: No superficial cervical adenopathy.     Upper Body:     Right upper body: No supraclavicular, axillary or pectoral adenopathy.     Left upper body: No supraclavicular, axillary or pectoral adenopathy.     Lower Body: No right inguinal adenopathy. No left inguinal adenopathy.  Neurological:     General: No focal deficit present.     Mental Status: He is alert and oriented to person, place, and time.  Psychiatric:        Mood and Affect: Mood normal.        Behavior: Behavior normal.     LABORATORY DATA:  I have reviewed the data as listed CBC Latest Ref Rng & Units 09/21/2021 01/14/2021 11/08/2020  WBC 3.4 - 10.8 x10E3/uL 8.5 5.4 5.9  Hemoglobin 13.0 - 17.7 g/dL 10.4(L) 10.6(L) 11.7(L)  Hematocrit 37.5 - 51.0 % 32.9(L) 31.3(L) 35.7(L)  Platelets 150 - 450 x10E3/uL 233 234 204   CMP Latest Ref Rng & Units 11/25/2021 09/21/2021 01/14/2021  Glucose 70 - 99 mg/dL - 117(H) 159(H)  BUN 8 - 27 mg/dL - 19 19  Creatinine 0.61 - 1.24 mg/dL 1.10 1.19 1.18  Sodium 134 - 144 mmol/L - 139 141  Potassium 3.5 - 5.2 mmol/L - 3.5 3.6  Chloride 96 - 106 mmol/L - 98 101  CO2 20 - 29 mmol/L - 21 24  Calcium 8.6 - 10.2 mg/dL - 9.7 9.1  Total Protein 6.0 - 8.5 g/dL - 6.3 6.8  Total Bilirubin 0.0  - 1.2 mg/dL - 0.8 0.5  Alkaline Phos 44 - 121 IU/L - 105 65  AST 0 - 40 IU/L - 10 16  ALT 0 - 44 IU/L - 8 14    RADIOGRAPHIC STUDIES: I have personally reviewed the radiological images as listed and agreed with the findings in the report. CT CHEST WO CONTRAST  Result Date: 01/04/2022 CLINICAL DATA:  Prostate cancer, assess treatment response. EXAM: CT CHEST WITHOUT CONTRAST TECHNIQUE: Multidetector CT imaging of the chest was performed following the standard protocol without IV contrast. RADIATION DOSE REDUCTION: This exam was performed according to the departmental dose-optimization program which includes automated exposure control, adjustment of the mA and/or kV according to patient size and/or use of iterative reconstruction technique. COMPARISON:  Bone scan 12/08/2021, MR abdomen 12/08/2021, CT abdomen pelvis 11/25/2021 and CT chest 04/12/2016. FINDINGS: Cardiovascular: Atherosclerotic calcification of the aorta and coronary arteries. Pulmonic trunk is enlarged. Heart is at the upper limits of normal in size. No pericardial effusion. Mediastinum/Nodes: No pathologically enlarged mediastinal or axillary lymph nodes. Hilar regions are difficult to definitively evaluate without IV contrast. There may  be distal esophageal wall thickening which can be seen with gastroesophageal reflux. Lungs/Pleura: Lungs are clear. No pleural fluid. Airway is unremarkable. Upper Abdomen: Visualized portions of the liver, adrenal glands, kidneys, spleen and pancreas are unremarkable. Small hiatal hernia. Haziness in the left upper quadrant, better seen on 11/25/2021. Musculoskeletal: Degenerative changes in the spine. No worrisome lytic or sclerotic lesions. IMPRESSION: 1. No CT correlate in the lower thoracic spine to correspond to the focal radiotracer uptake on bone scan 12/08/2021. No additional evidence of metastatic disease. 2. Haziness in the left upper quadrant, better evaluated on MR abdomen 12/08/2021. 3. Aortic  atherosclerosis (ICD10-I70.0). Coronary artery calcification. 4. Enlarged pulmonic trunk, indicative of pulmonary arterial hypertension. Electronically Signed   By: Lorin Picket M.D.   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  He has been on finasteride. - Biopsy on 12/24/2013: Right lateral mid-prostatic adenocarcinoma, Gleason 3+3=6 - Biopsy on 06/25/2020: Prostatic adenocarcinoma, T2b, Gleason 4+3=7, PSA 12.8 (adjusted for finasteride) - Bone scan on 07/06/2020 and CT on 07/29/2020 negative. - 11/09/2020: Insertion of radioactive I-125 seeds - PSA 5.4 (6/22), 53.2 (11/17/2021), 66.7 (11/22/2021), 382 (01/12/2022) - CTAP on 11/25/2021 with no evidence of metastatic disease.  New ill-defined soft tissue adjacent to the pancreatic body encasing the proximal splenic artery, sequela of interval pancreatitis although neoplasm is considered.  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 - Bone scan on 12/08/2021 with possible uptake in the lower thoracic vertebral body.  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 - CT chest without contrast on 01/03/2022 with no CT correlate in the lower thoracic spine.  No additional metastatic disease. - Degarelix to 40 mg by Dr. Jeffie Pollock on 01/12/2022.   Social/family history: - Lives with wife and granddaughter at home.  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  Quit smoking 50 years ago. - 2 sisters had brain cancer.  Maternal grandmother had cancer.  Niece had breast cancer.   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 - We have scheduled him for PSMA PET scan to be done tomorrow.  This will help was clarify the pancreatic lesion. - We also talked about antiandrogens Abiraterone with prednisone. - We discussed side effects in detail. - We  will start him on Abiraterone 750 mg daily along with prednisone 5 mg daily. - We also talked about genetic testing.  He is agreeable. - RTC after scan.  2.  Bone metastasis: - This is evident on MRI of the abdomen.  Will wait for PSMA PET scan results. - We also talked about initiating him on denosumab monthly to decrease skeletal related events. - He has partial plates.  He will see his dentist and get clearance.  3.  Atrial fibrillation and history of unprovoked right leg DVT: - Continue warfarin daily.  Last INR was 2.9 on 01/04/2022.   All questions were answered. The patient knows to call the clinic with any problems, questions or concerns.   Derek Jack, MD, 01/26/22 2:31 PM  [place] [phone]   I, Thana Ates, am acting as a scribe for Dr. Derek Jack.  I, Derek Jack MD, have reviewed the above documentation for accuracy and completeness, and I agree with the above.

## 2022-01-27 ENCOUNTER — Telehealth (HOSPITAL_COMMUNITY): Payer: Self-pay | Admitting: Pharmacist

## 2022-01-27 ENCOUNTER — Ambulatory Visit (HOSPITAL_COMMUNITY)
Admission: RE | Admit: 2022-01-27 | Discharge: 2022-01-27 | Disposition: A | Discharge status: Home / Self Care | Payer: No Typology Code available for payment source | Source: Ambulatory Visit | Attending: Hematology | Admitting: Hematology

## 2022-01-27 ENCOUNTER — Other Ambulatory Visit (HOSPITAL_COMMUNITY): Payer: Self-pay

## 2022-01-27 DIAGNOSIS — C61 Malignant neoplasm of prostate: Secondary | ICD-10-CM

## 2022-01-27 DIAGNOSIS — C7951 Secondary malignant neoplasm of bone: Secondary | ICD-10-CM | POA: Diagnosis not present

## 2022-01-27 DIAGNOSIS — K4090 Unilateral inguinal hernia, without obstruction or gangrene, not specified as recurrent: Secondary | ICD-10-CM | POA: Diagnosis not present

## 2022-01-27 MED ORDER — PIFLIFOLASTAT F 18 (PYLARIFY) INJECTION
9.0000 | Freq: Once | INTRAVENOUS | Status: AC
  Administered 2022-01-27: 9.97 via INTRAVENOUS

## 2022-01-27 NOTE — Telephone Encounter (Signed)
Oral Chemotherapy Pharmacist Encounter   Received notification that PA was not required.  Copay: $8768.11   Darl Pikes, PharmD, BCPS, BCOP, CPP Hematology/Oncology Clinical Pharmacist [place]/DB/AP Oral [facility] [phone]  01/27/2022 1:07 PM

## 2022-01-30 ENCOUNTER — Telehealth (HOSPITAL_COMMUNITY): Payer: Self-pay | Admitting: Pharmacy Technician

## 2022-01-30 ENCOUNTER — Telehealth (HOSPITAL_COMMUNITY): Payer: Self-pay | Admitting: Pharmacist

## 2022-01-30 ENCOUNTER — Other Ambulatory Visit (HOSPITAL_COMMUNITY): Payer: Self-pay

## 2022-01-30 MED ORDER — APALUTAMIDE 60 MG PO TABS
240.0000 mg | ORAL_TABLET | Freq: Every day | ORAL | 0 refills | Status: DC
  Filled 2022-01-30 – 2022-01-31 (×2): qty 120, 30d supply, fill #0

## 2022-01-30 NOTE — Telephone Encounter (Signed)
Oral Oncology Patient Advocate Encounter  Obtained free 30 day trial offer of Erleada for patient.  Billing info has been shared with [facility].  BIN: [id number] ID: [id number] GROUP: [id number]  Free voucher is limited to one patient per lifetime.  [place] Patient [place] Phone [phone] Fax [phone] 01/30/2022 12:38 PM

## 2022-01-30 NOTE — Telephone Encounter (Signed)
Oral Chemotherapy Pharmacist Encounter   Due to high cost of abiraterone and lack of assistance, MD elected to switch therapy plan to apalutamide (60mg ) 3 tabs daily for first month followed by full dose to 240mg  daily.   Darl Pikes, PharmD, BCPS, BCOP, CPP Hematology/Oncology Clinical Pharmacist [place]/DB/AP Oral [facility] [phone]  01/30/2022 9:52 AM

## 2022-01-30 NOTE — Telephone Encounter (Signed)
Oral Oncology Patient Advocate Encounter  After completing a benefits investigation, prior authorization for Alan [name] is not required at this time through [employer].  Patient's copay is $3201.82.  [place] Patient [place] Phone [phone] Fax [phone] 01/30/2022 1:38 PM

## 2022-01-30 NOTE — Telephone Encounter (Addendum)
Oral Oncology Pharmacist Encounter  Received new prescription for Erleada (apalutamide) for the treatment of metastatic castration sensitive prostate cancer in conjunction with ADT, planned duration until disease progression or unacceptable drug toxicity.  BP from 01/26/22 assessed, BP well controlled, continue to monitor. Prescription dose and frequency assessed.   Current medication list in Epic reviewed, several DDIs with apalutamide identified: Apalutamide may decrease the serum concentration of omeprazole, amlodipine, and warfarin. Monitor patient for decreased effectiveness of the listed medications. No baseline dose adjustment needed.  Warfarin is monitored by patient's PCP, Dr. Vonna Kotyk Dettinger. Once apalutamide is initiated recommend increased INR monitoring because patient may require an increase in his warfarin dose.  Evaluated chart and no patient barriers to medication adherence identified.   Prescription has been e-scribed to the [facility] for benefits analysis and approval.  Oral Oncology Clinic will continue to follow for insurance authorization, copayment issues, initial counseling and start date.   Darl Pikes, PharmD, BCPS, BCOP, CPP Hematology/Oncology Clinical Pharmacist Practitioner [place]/DB/AP Oral [place] [facility] [phone]  01/30/2022 11:02 AM

## 2022-01-31 ENCOUNTER — Telehealth: Payer: Self-pay

## 2022-01-31 ENCOUNTER — Other Ambulatory Visit (HOSPITAL_COMMUNITY): Payer: Self-pay

## 2022-01-31 NOTE — Telephone Encounter (Signed)
Patient has appt with Dr. Delton Coombes 02/23

## 2022-01-31 NOTE — Telephone Encounter (Signed)
-----   Message from Irine Seal, MD sent at 01/13/2022  9:40 AM EST ----- His PSA has jumped up markedly.  His testosterone is normal so we should get a response to the firmagon shots.   I will reach out to Dr. Delton Coombes to try to get him seen ASAP.Alan [name] ----- Message ----- From: Dorisann Frames, RN Sent: 01/13/2022   8:37 AM EST To: Irine Seal, MD  Please review

## 2022-01-31 NOTE — Telephone Encounter (Signed)
Oral Chemotherapy Pharmacist Encounter  Spoke with patient's wife to set-up [place] delivery. [facility] will deliver medication by 02/02/22.  Other: Patient's family was informed that the prednisone they picked up was no longer needed because he was no longer proceeding with prednisone.   Patient Education I spoke with patient's daughter Charlesetta Ivory for overview of new oral chemotherapy medication: Erleada (apalutamide) for the treatment of metastatic castration sensitive prostate cancer in conjunction with ADT, planned duration until disease progression or unacceptable drug toxicity.   Counseled Treva on administration, dosing, side effects, monitoring, drug-food interactions, safe handling, storage, and disposal. Patient will take 3 tablets (180 mg total) by mouth daily for the first month. MD plans to increase the dose to 4 tablets (240 mg) if tolerated.  Charlesetta Ivory was notified of rhe DDI with warfarin, she knows her father will likely require increased INR monitoring.  Side effects include but not limited to: fatigue, decreased wbc/hgb.    Reviewed with Charlesetta Ivory importance of keeping a medication schedule and plan for any missed doses.  After discussion with Treva no patient barriers to medication adherence identified. Charlesetta Ivory and her mom both help Mr. Lazalde manage his medications.  Treva voiced understanding and appreciation. All questions answered. Medication handout provided.  Provided Treva with [facility] phone number. Charlesetta Ivory knows to call the office with questions or concerns. Oral Chemotherapy [facility] will continue to follow.  Darl Pikes, PharmD, BCPS, BCOP, CPP Hematology/Oncology Clinical Pharmacist Practitioner [place]/DB/AP Oral [place] [facility] [phone]  01/31/2022 1:48 PM

## 2022-02-01 ENCOUNTER — Other Ambulatory Visit (HOSPITAL_COMMUNITY): Payer: Self-pay

## 2022-02-01 ENCOUNTER — Ambulatory Visit: Payer: No Typology Code available for payment source | Admitting: Family Medicine

## 2022-02-01 ENCOUNTER — Telehealth: Payer: Self-pay

## 2022-02-01 ENCOUNTER — Other Ambulatory Visit: Payer: Self-pay | Admitting: Family Medicine

## 2022-02-01 ENCOUNTER — Encounter (HOSPITAL_COMMUNITY): Payer: Self-pay | Admitting: Gastroenterology

## 2022-02-01 DIAGNOSIS — E1169 Type 2 diabetes mellitus with other specified complication: Secondary | ICD-10-CM

## 2022-02-01 NOTE — Telephone Encounter (Signed)
Please schedule appointment to check INR sooner, about a week after starting his new oral chemotherapy medicine

## 2022-02-01 NOTE — Telephone Encounter (Signed)
-----   Message from Worthy Rancher, MD sent at 02/01/2022  8:20 AM EST ----- Please have patient come off Coumadin 5 days before the procedure and restart it the day after.  If this sufficient to do it on epic or do need a signed order faxed. Caryl Pina, MD [facility] 02/01/2022, 8:20 AM    ----- Message ----- From: Timothy Lasso, RN Sent: 02/01/2022   8:15 AM EST To: Fransisca Kaufmann Dettinger, MD  Please respond ASAP thank you

## 2022-02-01 NOTE — Telephone Encounter (Signed)
Spoke with Dettinger and he is getting EDGS on 03/2. Wants to wait a week after he restarts to get INR patient scheduled 03/09

## 2022-02-01 NOTE — Telephone Encounter (Signed)
The pt and his wife have been advised to stop coumadin 5 days prior to his procedure on 3/2.  The pt has been advised of the information and verbalized understanding.

## 2022-02-02 ENCOUNTER — Inpatient Hospital Stay (HOSPITAL_BASED_OUTPATIENT_CLINIC_OR_DEPARTMENT_OTHER): Payer: No Typology Code available for payment source | Admitting: Hematology

## 2022-02-02 ENCOUNTER — Other Ambulatory Visit: Payer: Self-pay

## 2022-02-02 ENCOUNTER — Other Ambulatory Visit (HOSPITAL_COMMUNITY): Payer: Self-pay

## 2022-02-02 ENCOUNTER — Ambulatory Visit (HOSPITAL_COMMUNITY)
Admission: RE | Admit: 2022-02-02 | Discharge: 2022-02-02 | Disposition: A | Discharge status: Home / Self Care | Payer: No Typology Code available for payment source | Source: Ambulatory Visit | Attending: Hematology | Admitting: Hematology

## 2022-02-02 VITALS — BP 168/73 | HR 72 | Temp 98.8°F | Resp 18 | Ht 64.17 in | Wt 159.8 lb

## 2022-02-02 DIAGNOSIS — Z8546 Personal history of malignant neoplasm of prostate: Secondary | ICD-10-CM | POA: Diagnosis not present

## 2022-02-02 DIAGNOSIS — Z9181 History of falling: Secondary | ICD-10-CM | POA: Diagnosis not present

## 2022-02-02 DIAGNOSIS — C61 Malignant neoplasm of prostate: Secondary | ICD-10-CM | POA: Diagnosis not present

## 2022-02-02 DIAGNOSIS — M47816 Spondylosis without myelopathy or radiculopathy, lumbar region: Secondary | ICD-10-CM | POA: Diagnosis not present

## 2022-02-02 DIAGNOSIS — M2578 Osteophyte, vertebrae: Secondary | ICD-10-CM | POA: Diagnosis not present

## 2022-02-02 DIAGNOSIS — M545 Low back pain, unspecified: Secondary | ICD-10-CM | POA: Diagnosis not present

## 2022-02-02 DIAGNOSIS — C7951 Secondary malignant neoplasm of bone: Secondary | ICD-10-CM | POA: Diagnosis not present

## 2022-02-02 NOTE — Progress Notes (Signed)
[place], [place] [address]   CLINIC:  Medical Oncology/Hematology  PCP:  Dettinger, Fransisca Kaufmann, MD [place] / [place] [id number] [phone]   REASON FOR VISIT:  Follow-up for prostate cancer  PRIOR THERAPY: none  NGS Results: not done  CURRENT THERAPY: under work-up  BRIEF ONCOLOGIC HISTORY:  Oncology History  Prostate cancer ([place])  04/13/2014 Initial Diagnosis   Prostate cancer ([place])   06/25/2020 Cancer Staging   Staging form: Prostate, AJCC 8th Edition - Clinical stage from 06/25/2020: Stage IIC (cT2b, cN0, cM0, PSA: 12.8, Grade Group: 3) - Signed by Freeman Caldron, PA-C on 09/29/2020      CANCER STAGING: Cancer Staging  Prostate cancer [facility]) Staging form: Prostate, AJCC 8th Edition - Clinical stage from 06/25/2020: Stage IIC (cT2b, cN0, cM0, PSA: 12.8, Grade Group: 3) - Signed by Freeman Caldron, PA-C on 09/29/2020 - Pathologic stage from 01/26/2022: Stage IVB (pT2, pN0, pM1b, Grade Group: 3) - Unsigned   INTERVAL HISTORY:  [name], a 81 y.o. male, returns for routine follow-up of his prostate cancer. [name] was last seen on 01/26/2022.   Today he reports feeling well. He has not started Abiraterone; due to the cost and lack of assistance for abiraterone, his prescription was switched to [facility] which he has not yet received or started. He reports he lost his balance when stepping upwards, and he fell on 02/19 from which he continues to have lower back pain which prevents him from laying down. This pain disrupts his sleep.   REVIEW OF SYSTEMS:  Review of Systems  Constitutional:  Negative for appetite change and fatigue.  Gastrointestinal:  Positive for constipation and diarrhea.  Musculoskeletal:  Positive for back pain (5/10 lower).  Psychiatric/Behavioral:  Positive for sleep disturbance.   All other systems reviewed and are negative.  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   found during hospital stay with pancreatitis,  started coumadin;   last Abd CT in epic 07-29-2020  chronic thrombus portal venous   GERD (gastroesophageal reflux disease)    Heart murmur    History of acute pancreatitis    2016   necrotizing pancreatitis   History of adenomatous polyp of colon    History of DVT of lower extremity    History of GI bleed 08/2019   upper gi bleed due to duodenal ulcer   History of gout yrs ago   Hypertension    followed by pcp   Hypertrophic cardiomyopathy ([place])    followed by cardiology   Internal hemorrhoids    Mixed hyperlipidemia    Normocytic anemia    PAF (paroxysmal atrial fibrillation) ([place]) 04/2016   cardiologist-- dr h. Tamala Julian--  event monitor 07-04-2017 epic, NSR/ PAflutter with occasional RVR/  PACs/ PVCs ;  echo 05-16-2017 epic,  moderate LVH with severe asymptomic septal hypertrophy, G1DD, EF 60-65%,  mild AR, mild LAE   Pancreatic pseudocyst    Peyronie's disease    Prostate cancer [facility]) urologist-- dr wrenn/ oncologist-- dr Tammi Klippel   first dx 08/ 2009 Gleason 3+3 active survelliance;  until bx 07/ 2021  Stage T2b, Gleason 4+3   Right inguinal hernia    Type 2 diabetes  mellitus ([place])    followed by pcp---  (11-03-2020 per pt currently not checking blood sugar due to glucose monitor broken)   Wears glasses    Wears partial dentures    upper and lower   Past Surgical History:  Procedure Laterality Date   CHOLECYSTECTOMY N/A 11/26/2015   Procedure: LAPAROSCOPIC CHOLECYSTECTOMY;  Surgeon: Aviva Signs, MD;  Location: [facility];  Service: General;  Laterality: N/A;   COLONOSCOPY  last one 09-06-2020  dr stark   CYSTOSCOPY N/A 11/09/2020   Procedure: Erlene Quan;  Surgeon: Irine Seal, MD;  Location: [facility];   Service: Urology;  Laterality: N/A;   ESOPHAGOGASTRODUODENOSCOPY  10-06-2019  _0    FLEXIBLE BRONCHOSCOPY Bilateral 04/14/2016   Procedure: FLEXIBLE BRONCHOSCOPY;  Surgeon: Sinda Du, MD;  Location: [facility];  Service: Cardiopulmonary;  Laterality: Bilateral;   HEMORRHOID SURGERY  yrs ago   RADIOACTIVE SEED IMPLANT N/A 11/09/2020   Procedure: RADIOACTIVE SEED IMPLANT/BRACHYTHERAPY IMPLANT;  Surgeon: Irine Seal, MD;  Location: [facility];  Service: Urology;  Laterality: N/A;   SPACE OAR INSTILLATION N/A 11/09/2020   Procedure: SPACE OAR INSTILLATION;  Surgeon: Irine Seal, MD;  Location: [facility];  Service: Urology;  Laterality: N/A;   UMBILICAL HERNIA REPAIR N/A 11/26/2015   Procedure: UMBILICAL HERNIORRHAPHY;  Surgeon: Aviva Signs, MD;  Location: [facility];  Service: General;  Laterality: N/A;    SOCIAL HISTORY:  Social History   Socioeconomic History   Marital status: Married    Spouse name: [name]   Number of children: 4   Years of education: Not on file   Highest education level: 10th grade  Occupational History   Occupation: Retired     Fish farm manager: [employer]  Tobacco Use   Smoking status: Former    Years: 5.00    Types: Cigarettes    Quit date: 12/11/1986    Years since quitting: 35.1   Smokeless tobacco: Never  Vaping Use   Vaping Use: Never used  Substance and Sexual Activity   Alcohol use: No   Drug use: Never   Sexual activity: Yes  Other Topics Concern   Not on file  Social History Narrative   Not on file   Social Determinants of Health   Financial Resource Strain: Low Risk    Difficulty of Paying Living Expenses: Not hard at all  Food Insecurity: No Food Insecurity   Worried About Charity fundraiser in the Last Year: Never true   [place] in the Last Year: Never true  Transportation Needs: No Transportation Needs   Lack of Transportation (Medical): No   Lack of Transportation (Non-Medical): No  Physical  Activity: Inactive   Days of Exercise per Week: 0 days   Minutes of Exercise per Session: 0 min  Stress: Stress Concern Present   Feeling of Stress : To some extent  Social Connections: Socially Integrated   Frequency of Communication with Friends and Family: Three times a week   Frequency of Social Gatherings with Friends and Family: Three times a week   Attends Religious Services: More than 4 times per year   Active Member of Clubs or Organizations: Yes   Attends Music therapist: More than 4 times per year   Marital Status: Married  Human resources officer Violence: Not At Risk   Fear of Current or Ex-Partner: No   Emotionally Abused: No   Physically Abused: No   Sexually Abused: No    FAMILY HISTORY:  Family History  Problem Relation Age of Onset   Stroke Mother    Hypertension Mother    Alzheimer's disease Mother    Hypertension Father    Brain cancer Sister    Breast cancer Sister    Hypertension Sister    Deep vein thrombosis Brother    Hypertension Brother    Colon cancer Neg Hx    Pancreatic disease Neg Hx    Esophageal cancer Neg Hx    Rectal cancer Neg Hx    Stomach cancer Neg Hx     CURRENT MEDICATIONS:  Current Outpatient Medications  Medication Sig Dispense Refill   acetaminophen (TYLENOL) 500 MG tablet Take 1,000 mg by mouth every 6 (six) hours as needed for moderate pain.     amLODipine (NORVASC) 10 MG tablet TAKE 1 TABLET BY MOUTH ONCE DAILY IN THE EVENING 90 tablet 0   apalutamide (ERLEADA) 60 MG tablet Take 4 tablets (240 mg total) by mouth daily. Take as directed. 120 tablet 0   Blood Glucose Monitoring Suppl (ONE TOUCH ULTRA MINI) w/Device KIT 1 each by Does not apply route 2 (two) times daily. 1 kit 1   chlorthalidone (HYGROTON) 25 MG tablet Take 1 tablet (25 mg total) by mouth daily. Please make overdue appt with Dr. Tamala Julian before anymore refills. Thank you 1st attempt 30 tablet 0   diclofenac Sodium (VOLTAREN) 1 % GEL Apply 1 application  topically 2 (two) times daily as needed (pain).     FEROSUL 325 (65 Fe) MG tablet Take 1 tablet by mouth once daily with breakfast 90 tablet 0   fluticasone (FLONASE) 50 MCG/ACT nasal spray Place 1 spray into both nostrils 2 (two) times daily as needed for allergies or rhinitis. 16 g 6   glucose blood (ONE TOUCH ULTRA TEST) test strip Use to check BG once daily.  Dx:  Type 2 DM controlled E11.9 100 each 4   lisinopril (ZESTRIL) 20 MG tablet TAKE 1 & 1/2 (ONE & ONE-HALF) TABLETS BY MOUTH ONCE DAILY 135 tablet 0   metFORMIN (GLUCOPHAGE) 1000 MG tablet Take 1 tablet (1,000 mg total) by mouth 2 (two) times daily with a meal. 180 tablet 3   metoprolol succinate (TOPROL-XL) 100 MG 24 hr tablet TAKE 1 TABLET BY MOUTH ONCE DAILY WITH OR IMMEDIATELY FOLLOWING A MEAL 90 tablet 0   Multiple Vitamins-Minerals (CENTRUM SILVER 50+MEN) TABS Take 1 tablet by mouth daily.     omeprazole (PRILOSEC) 20 MG capsule Take 1 capsule (20 mg total) by mouth daily. 90 capsule 3   potassium chloride SA (KLOR-CON M) 20 MEQ tablet TAKE 1  BY MOUTH ONCE DAILY 90 tablet 1   pravastatin (PRAVACHOL) 40 MG tablet Take 1 tablet (40 mg total) by mouth every evening. 90 tablet 3   warfarin (COUMADIN) 5 MG tablet TAKE 1 & 1/2 (ONE & ONE-HALF) TABLETS BY MOUTH ONCE DAILY 6 IN THE EVENING (Patient taking differently: Take 7.5 mg by mouth daily at 4 PM. TAKE 1 & 1/2 (ONE & ONE-HALF) TABLETS BY MOUTH ONCE DAILY 6 IN THE EVENING) 90 tablet 3   No current facility-administered medications for this visit.    ALLERGIES:  Allergies  Allergen Reactions   Allopurinol Other (See Comments)    Significantly decreased WBC's   Colchicine Other (See Comments)    Significantly decreased WBC's    PHYSICAL EXAM:  Performance status (ECOG): 1 - Symptomatic but completely ambulatory  There were no vitals filed for this visit. Wt Readings from Last 3  Encounters:  01/26/22 158 lb 11.7 oz (72 kg)  01/12/22 151 lb 12.8 oz (68.9 kg)  01/04/22 158 lb  (71.7 kg)   Physical Exam Vitals reviewed.  Constitutional:      Appearance: Normal appearance.  Cardiovascular:     Rate and Rhythm: Normal rate and regular rhythm.     Pulses: Normal pulses.     Heart sounds: Normal heart sounds.  Pulmonary:     Effort: Pulmonary effort is normal.     Breath sounds: Normal breath sounds.  Musculoskeletal:     Thoracic back: No tenderness.     Lumbar back: No tenderness.  Neurological:     General: No focal deficit present.     Mental Status: He is alert and oriented to person, place, and time.  Psychiatric:        Mood and Affect: Mood normal.        Behavior: Behavior normal.     LABORATORY DATA:  I have reviewed the labs as listed.  CBC Latest Ref Rng & Units 01/26/2022 09/21/2021 01/14/2021  WBC 4.0 - 10.5 K/uL 5.4 8.5 5.4  Hemoglobin 13.0 - 17.0 g/dL 9.5(L) 10.4(L) 10.6(L)  Hematocrit 39.0 - 52.0 % 30.0(L) 32.9(L) 31.3(L)  Platelets 150 - 400 K/uL 266 233 234   CMP Latest Ref Rng & Units 01/26/2022 11/25/2021 09/21/2021  Glucose 70 - 99 mg/dL 122(H) - 117(H)  BUN 8 - 23 mg/dL 29(H) - 19  Creatinine 0.61 - 1.24 mg/dL 1.02 1.10 1.19  Sodium 135 - 145 mmol/L 136 - 139  Potassium 3.5 - 5.1 mmol/L 4.2 - 3.5  Chloride 98 - 111 mmol/L 102 - 98  CO2 22 - 32 mmol/L 21(L) - 21  Calcium 8.9 - 10.3 mg/dL 9.0 - 9.7  Total Protein 6.5 - 8.1 g/dL 7.3 - 6.3  Total Bilirubin 0.3 - 1.2 mg/dL 0.7 - 0.8  Alkaline Phos 38 - 126 U/L 392(H) - 105  AST 15 - 41 U/L 27 - 10  ALT 0 - 44 U/L 18 - 8    DIAGNOSTIC IMAGING:  I have independently reviewed the scans and discussed with the patient. NM PET (PSMA) SKULL TO MID THIGH  Result Date: 01/30/2022 CLINICAL DATA:  Prostate cancer. Prior brachytherapy (11/09/2020). Elevated PSA equal 72.3 EXAM: NUCLEAR MEDICINE PET SKULL BASE TO THIGH TECHNIQUE: 9.97 mCi F18 Piflufolastat (Pylarify) was injected intravenously. Full-ring PET imaging was performed from the skull base to thigh after the radiotracer. CT data  was obtained and used for attenuation correction and anatomic localization. COMPARISON:  Bone scan 12/08/2021, CT scan 11/25/2021 FINDINGS: NECK No radiotracer activity in neck lymph nodes. Incidental CT finding: None CHEST No radiotracer accumulation within mediastinal or hilar lymph nodes. No suspicious pulmonary nodules on the CT scan. Incidental CT finding: None ABDOMEN/PELVIS Prostate: Focal activity in the anterior RIGHT apical region of the prostate gland concerning for local prostate cancer recurrence (SUV max equal 7 point on image 191). Lymph nodes: No abnormal radiotracer accumulation within pelvic or abdominal nodes. Liver: No evidence of liver metastasis Incidental CT finding: Large RIGHT inguinal hernia with a significant portion of the small intestine extending into the RIGHT hemiscrotum. SKELETON Multifocal intense radiotracer activity within the pelvis and sacrum. Example lesion in the RIGHT iliac bone/acetabulum SUV max equal 12.7. This is a broad lesion measuring 6 cm. Partially lytic lesion the posterior RIGHT iliac bone measuring 2 cm with SUV max equal 14.8. Broad lesion in the near entirety of the LEFT sacral ala with SUV  max equal 16.6. There multiple foci of radiotracer activity within the thoracic spine, lumbar spine and cervical spine. Lesion on comparison bone scan is intensely radiotracer avid in the lower thoracic spine (T10). There is subtle lucency on the CT portion exam. Proximal femurs and humeri involved. Additional lesions in the spine ribs and sternum. Nearly every vertebral body is involved. IMPRESSION: 1. Dominant finding is wide spread intensely radiotracer avid prostate cancer skeletal metastasis in the axillary and appendicular skeleton. Nearly every vertebral body is involved with carcinoma. Broad lesions in the sacrum and pelvis. Minimal change on the CT portion of exam. 2. Focal activity in the anterior apical region of prostate gland is concerning for local recurrence.  3. No metastatic adenopathy or visceral metastasis. Electronically Signed   By: Suzy Bouchard M.D.   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  He has been on finasteride. - Biopsy on 12/24/2013: Right lateral mid-prostatic adenocarcinoma, Gleason 3+3=6 - Biopsy on 06/25/2020: Prostatic adenocarcinoma, T2b, Gleason 4+3=7, PSA 12.8 (adjusted for finasteride) - Bone scan on 07/06/2020 and CT on 07/29/2020 negative. - 11/09/2020: Insertion of radioactive I-125 seeds - PSA 5.4 (6/22), 53.2 (11/17/2021), 66.7 (11/22/2021), 382 (01/12/2022) - CTAP on 11/25/2021 with no evidence of metastatic disease.  New ill-defined soft tissue adjacent to the pancreatic body encasing the proximal splenic artery, sequela of interval pancreatitis although neoplasm is considered.  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 - Bone scan on 12/08/2021 with possible uptake in the lower thoracic vertebral body.  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 - CT chest without contrast on 01/03/2022 with no CT correlate in the lower thoracic spine.  No additional metastatic disease. - Degarelix  240 mg by Dr. Jeffie Pollock on 01/12/2022.    Social/family history: - Lives with wife and granddaughter at home.  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  Quit smoking 50 years ago. - 2 sisters had brain cancer.  Maternal grandmother had cancer.  Niece had breast cancer.   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  Broad lesions in the sacrum and pelvis. - Focal activity in the anterior apical region of the prostate gland.  No metastatic adenopathy or visceral metastasis. - Because of extensive metastatic disease in  the hips, I have ordered x-rays of both hips and reviewed them.  It did not show any evidence of acute fracture. - He received first dose of Firmagon on 01/12/2022.  We talked about initiating him on ARI.  His co-pay for Abiraterone was very high. - We talked about starting him on apalutamide.  We discussed side effects in detail including hypertension, hot flashes, fatigue among others. - We will start him on 180 mg daily for the first month.  If he tolerates well, will increase it to 240 mg daily. - He will start when he receives shipment of apalutamide. - RTC 3 weeks for follow-up with repeat labs.  2.  Bone metastasis: -He has extensive bony metastatic disease. - We talked about initiating him on denosumab monthly to decrease skeletal related events. - He will be seeing his dentist to get clearance prior to start of denosumab. - He reported fall at home last week.  He was reluctant to go to the ER.  He is complaining of lower back pain in the lumbar spine region. - I have done x-rays and reviewed them.  No radiographic evidence of acute fracture to the lumbar spine.  Known metastatic disease.  3.  Atrial fibrillation and history of unprovoked right leg DVT: -Continue warfarin daily.  Last INR 2.9.   Orders placed this encounter:  No orders of the defined types were placed in this encounter.    Derek Jack, MD [place] [phone]   I, Thana Ates, am acting as a scribe for Dr. Derek Jack.  I, Derek Jack MD, have reviewed the above documentation for accuracy and completeness, and I agree with the above.

## 2022-02-02 NOTE — Patient Instructions (Addendum)
[place] at [facility] Discharge Instructions  You were seen and examined today by Dr. Delton Coombes.  Your PET scan revealed extensive spread of prostate cancer to your bones. Dr. Delton Coombes has recommended an X-Ray of your hips, pelvic and lower back due to your recent fall.  Start [place] as soon as you get it. Dr. Delton Coombes will follow-up with you in about 3 weeks.  Please call Dr. Ralene Muskrat office to get [place] exactly 4 weeks from your last.   Thank you for choosing [place] at [facility] to provide your oncology and hematology care.  To afford each patient quality time with our provider, please arrive at least 15 minutes before your scheduled appointment time.   If you have a lab appointment with the [facility] please come in thru the Main Entrance and check in at the main information desk.  You need to re-schedule your appointment should you arrive 10 or more minutes late.  We strive to give you quality time with our providers, and arriving late affects you and other patients whose appointments are after yours.  Also, if you no show three or more times for appointments you may be dismissed from the clinic at the providers discretion.     Again, thank you for choosing [facility].  Our hope is that these requests will decrease the amount of time that you wait before being seen by our physicians.       _____________________________________________________________  Should you have questions after your visit to [facility], please contact our office at [phone] and follow the prompts.  Our office hours are 8:00 a.m. and 4:30 p.m. Monday - Friday.  Please note that voicemails left after 4:00 p.m. may not be returned until the following business day.  We are closed weekends and major holidays.  You do have access to a nurse 24-7, just call the main number to the clinic [phone] and do not press any options,  hold on the line and a nurse will answer the phone.    For prescription refill requests, have your pharmacy contact our office and allow 72 hours.    Due to Covid, you will need to wear a mask upon entering the hospital. If you do not have a mask, a mask will be given to you at the Main Entrance upon arrival. For doctor visits, patients may have 1 support person age 36 or older with them. For treatment visits, patients can not have anyone with them due to social distancing guidelines and our immunocompromised population.

## 2022-02-02 NOTE — H&P (View-Only) (Signed)
[place], [place] [address]   CLINIC:  Medical Oncology/Hematology  PCP:  [name], Alan Kaufmann, MD [place] / [place] [id number] [phone]   REASON FOR VISIT:  Follow-up for prostate cancer  PRIOR THERAPY: none  NGS Results: not done  CURRENT THERAPY: under work-up  BRIEF ONCOLOGIC HISTORY:  Oncology History  Prostate cancer ([place])  04/13/2014 Initial Diagnosis   Prostate cancer ([place])   06/25/2020 Cancer Staging   Staging form: Prostate, AJCC 8th Edition - Clinical stage from 06/25/2020: Stage IIC (cT2b, cN0, cM0, PSA: 12.8, Grade Group: 3) - Signed by Alan [name] on 09/29/2020      CANCER STAGING: Cancer Staging  Prostate cancer [facility]) Staging form: Prostate, AJCC 8th Edition - Clinical stage from 06/25/2020: Stage IIC (cT2b, cN0, cM0, PSA: 12.8, Grade Group: 3) - Signed by Alan [name] on 09/29/2020 - Pathologic stage from 01/26/2022: Stage IVB (pT2, pN0, pM1b, Grade Group: 3) - Unsigned   INTERVAL HISTORY:  [name], a 81 y.o. male, returns for routine follow-up of his prostate cancer. Alan [name] was last seen on 01/26/2022.   Today he reports feeling well. He has not started Abiraterone; due to the cost and lack of assistance for abiraterone, his prescription was switched to [facility] which he has not yet received or started. He reports he lost his balance when stepping upwards, and he fell on 02/19 from which he continues to have lower back pain which prevents him from laying down. This pain disrupts his sleep.   REVIEW OF SYSTEMS:  Review of Systems  Constitutional:  Negative for appetite change and fatigue.  Gastrointestinal:  Positive for constipation and diarrhea.  Musculoskeletal:  Positive for back pain (5/10 lower).  Psychiatric/Behavioral:  Positive for sleep disturbance.   All other systems reviewed and are negative.  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   found during hospital stay with pancreatitis,  started coumadin;   last Abd CT in epic 07-29-2020  chronic thrombus portal venous   GERD (gastroesophageal reflux disease)    Heart murmur    History of acute pancreatitis    2016   necrotizing pancreatitis   History of adenomatous polyp of colon    History of DVT of lower extremity    History of GI bleed 08/2019   upper gi bleed due to duodenal ulcer   History of gout yrs ago   Hypertension    followed by pcp   Hypertrophic cardiomyopathy ([place])    followed by cardiology   Internal hemorrhoids    Mixed hyperlipidemia    Normocytic anemia    PAF (paroxysmal atrial fibrillation) ([place]) 04/2016   cardiologist-- Alan h. Alan Julian--  event monitor 07-04-2017 epic, NSR/ PAflutter with occasional RVR/  PACs/ PVCs ;  echo 05-16-2017 epic,  moderate LVH with severe asymptomic septal hypertrophy, G1DD, EF 60-65%,  mild AR, mild LAE   Pancreatic pseudocyst    Peyronie's disease    Prostate cancer [facility]) urologist-- Alan [name]/ oncologist-- Alan Tammi [name]   first dx 08/ 2009 Gleason 3+3 active survelliance;  until bx 07/ 2021  Stage T2b, Gleason 4+3   Right inguinal hernia    Type 2 diabetes  mellitus ([place])    followed by pcp---  (11-03-2020 per pt currently not checking blood sugar due to glucose monitor broken)   Wears glasses    Wears partial dentures    upper and lower   Past Surgical History:  Procedure Laterality Date   CHOLECYSTECTOMY N/A 11/26/2015   Procedure: LAPAROSCOPIC CHOLECYSTECTOMY;  Surgeon: Alan Signs, MD;  Location: [facility];  Service: General;  Laterality: N/A;   COLONOSCOPY  last one 09-06-2020  Alan [name]   CYSTOSCOPY N/A 11/09/2020   Procedure: Alan [name];  Surgeon: Alan Seal, MD;  Location: [facility];   Service: Urology;  Laterality: N/A;   ESOPHAGOGASTRODUODENOSCOPY  10-06-2019  _0    FLEXIBLE BRONCHOSCOPY Bilateral 04/14/2016   Procedure: FLEXIBLE BRONCHOSCOPY;  Surgeon: Alan Du, MD;  Location: [facility];  Service: Cardiopulmonary;  Laterality: Bilateral;   HEMORRHOID SURGERY  yrs ago   RADIOACTIVE SEED IMPLANT N/A 11/09/2020   Procedure: RADIOACTIVE SEED IMPLANT/BRACHYTHERAPY IMPLANT;  Surgeon: Alan Seal, MD;  Location: [facility];  Service: Urology;  Laterality: N/A;   SPACE OAR INSTILLATION N/A 11/09/2020   Procedure: SPACE OAR INSTILLATION;  Surgeon: Alan Seal, MD;  Location: [facility];  Service: Urology;  Laterality: N/A;   UMBILICAL HERNIA REPAIR N/A 11/26/2015   Procedure: UMBILICAL HERNIORRHAPHY;  Surgeon: Alan Signs, MD;  Location: [facility];  Service: General;  Laterality: N/A;    SOCIAL HISTORY:  Social History   Socioeconomic History   Marital status: Married    Spouse name: Alan [name]   Number of children: 4   Years of education: Not on file   Highest education level: 10th grade  Occupational History   Occupation: Retired     Fish farm manager: [employer]  Tobacco Use   Smoking status: Former    Years: 5.00    Types: Cigarettes    Quit date: 12/11/1986    Years since quitting: 35.1   Smokeless tobacco: Never  Vaping Use   Vaping Use: Never used  Substance and Sexual Activity   Alcohol use: No   Drug use: Never   Sexual activity: Yes  Other Topics Concern   Not on file  Social History Narrative   Not on file   Social Determinants of Health   Financial Resource Strain: Low Risk    Difficulty of Paying Living Expenses: Not hard at all  Food Insecurity: No Food Insecurity   Worried About Charity fundraiser in the Last Year: Never true   [place] in the Last Year: Never true  Transportation Needs: No Transportation Needs   Lack of Transportation (Medical): No   Lack of Transportation (Non-Medical): No  Physical  Activity: Inactive   Days of Exercise per Week: 0 days   Minutes of Exercise per Session: 0 min  Stress: Stress Concern Present   Feeling of Stress : To some extent  Social Connections: Socially Integrated   Frequency of Communication with Friends and Family: Three times a week   Frequency of Social Gatherings with Friends and Family: Three times a week   Attends Religious Services: More than 4 times per year   Active Member of Clubs or Organizations: Yes   Attends Music therapist: More than 4 times per year   Marital Status: Married  Human resources officer Violence: Not At Risk   Fear of Current or Ex-Partner: No   Emotionally Abused: No   Physically Abused: No   Sexually Abused: No    FAMILY HISTORY:  Family History  Problem Relation Age of Onset   Stroke Mother    Hypertension Mother    Alzheimer's disease Mother    Hypertension Father    Brain cancer Sister    Breast cancer Sister    Hypertension Sister    Deep vein thrombosis Brother    Hypertension Brother    Colon cancer Neg Hx    Pancreatic disease Neg Hx    Esophageal cancer Neg Hx    Rectal cancer Neg Hx    Stomach cancer Neg Hx     CURRENT MEDICATIONS:  Current Outpatient Medications  Medication Sig Dispense Refill   acetaminophen (TYLENOL) 500 MG tablet Take 1,000 mg by mouth every 6 (six) hours as needed for moderate pain.     amLODipine (NORVASC) 10 MG tablet TAKE 1 TABLET BY MOUTH ONCE DAILY IN THE EVENING 90 tablet 0   apalutamide (ERLEADA) 60 MG tablet Take 4 tablets (240 mg total) by mouth daily. Take as directed. 120 tablet 0   Blood Glucose Monitoring Suppl (ONE TOUCH ULTRA MINI) w/Device KIT 1 each by Does not apply route 2 (two) times daily. 1 kit 1   chlorthalidone (HYGROTON) 25 MG tablet Take 1 tablet (25 mg total) by mouth daily. Please make overdue appt with Alan. Tamala Julian before anymore refills. Thank you 1st attempt 30 tablet 0   diclofenac Sodium (VOLTAREN) 1 % GEL Apply 1 application  topically 2 (two) times daily as needed (pain).     FEROSUL 325 (65 Fe) MG tablet Take 1 tablet by mouth once daily with breakfast 90 tablet 0   fluticasone (FLONASE) 50 MCG/ACT nasal spray Place 1 spray into both nostrils 2 (two) times daily as needed for allergies or rhinitis. 16 g 6   glucose blood (ONE TOUCH ULTRA TEST) test strip Use to check BG once daily.  Dx:  Type 2 DM controlled E11.9 100 each 4   lisinopril (ZESTRIL) 20 MG tablet TAKE 1 & 1/2 (ONE & ONE-HALF) TABLETS BY MOUTH ONCE DAILY 135 tablet 0   metFORMIN (GLUCOPHAGE) 1000 MG tablet Take 1 tablet (1,000 mg total) by mouth 2 (two) times daily with a meal. 180 tablet 3   metoprolol succinate (TOPROL-XL) 100 MG 24 hr tablet TAKE 1 TABLET BY MOUTH ONCE DAILY WITH OR IMMEDIATELY FOLLOWING A MEAL 90 tablet 0   Multiple Vitamins-Minerals (CENTRUM SILVER 50+MEN) TABS Take 1 tablet by mouth daily.     omeprazole (PRILOSEC) 20 MG capsule Take 1 capsule (20 mg total) by mouth daily. 90 capsule 3   potassium chloride SA (KLOR-CON M) 20 MEQ tablet TAKE 1  BY MOUTH ONCE DAILY 90 tablet 1   pravastatin (PRAVACHOL) 40 MG tablet Take 1 tablet (40 mg total) by mouth every evening. 90 tablet 3   warfarin (COUMADIN) 5 MG tablet TAKE 1 & 1/2 (ONE & ONE-HALF) TABLETS BY MOUTH ONCE DAILY 6 IN THE EVENING (Patient taking differently: Take 7.5 mg by mouth daily at 4 PM. TAKE 1 & 1/2 (ONE & ONE-HALF) TABLETS BY MOUTH ONCE DAILY 6 IN THE EVENING) 90 tablet 3   No current facility-administered medications for this visit.    ALLERGIES:  Allergies  Allergen Reactions   Allopurinol Other (See Comments)    Significantly decreased WBC's   Colchicine Other (See Comments)    Significantly decreased WBC's    PHYSICAL EXAM:  Performance status (ECOG): 1 - Symptomatic but completely ambulatory  There were no vitals filed for this visit. Wt Readings from Last 3  Encounters:  01/26/22 158 lb 11.7 oz (72 kg)  01/12/22 151 lb 12.8 oz (68.9 kg)  01/04/22 158 lb  (71.7 kg)   Physical Exam Vitals reviewed.  Constitutional:      Appearance: Normal appearance.  Cardiovascular:     Rate and Rhythm: Normal rate and regular rhythm.     Pulses: Normal pulses.     Heart sounds: Normal heart sounds.  Pulmonary:     Effort: Pulmonary effort is normal.     Breath sounds: Normal breath sounds.  Musculoskeletal:     Thoracic back: No tenderness.     Lumbar back: No tenderness.  Neurological:     General: No focal deficit present.     Mental Status: He is alert and oriented to person, place, and time.  Psychiatric:        Mood and Affect: Mood normal.        Behavior: Behavior normal.     LABORATORY DATA:  I have reviewed the labs as listed.  CBC Latest Ref Rng & Units 01/26/2022 09/21/2021 01/14/2021  WBC 4.0 - 10.5 K/uL 5.4 8.5 5.4  Hemoglobin 13.0 - 17.0 g/dL 9.5(L) 10.4(L) 10.6(L)  Hematocrit 39.0 - 52.0 % 30.0(L) 32.9(L) 31.3(L)  Platelets 150 - 400 K/uL 266 233 234   CMP Latest Ref Rng & Units 01/26/2022 11/25/2021 09/21/2021  Glucose 70 - 99 mg/dL 122(H) - 117(H)  BUN 8 - 23 mg/dL 29(H) - 19  Creatinine 0.61 - 1.24 mg/dL 1.02 1.10 1.19  Sodium 135 - 145 mmol/L 136 - 139  Potassium 3.5 - 5.1 mmol/L 4.2 - 3.5  Chloride 98 - 111 mmol/L 102 - 98  CO2 22 - 32 mmol/L 21(L) - 21  Calcium 8.9 - 10.3 mg/dL 9.0 - 9.7  Total Protein 6.5 - 8.1 g/dL 7.3 - 6.3  Total Bilirubin 0.3 - 1.2 mg/dL 0.7 - 0.8  Alkaline Phos 38 - 126 U/L 392(H) - 105  AST 15 - 41 U/L 27 - 10  ALT 0 - 44 U/L 18 - 8    DIAGNOSTIC IMAGING:  I have independently reviewed the scans and discussed with the patient. NM PET (PSMA) SKULL TO MID THIGH  Result Date: 01/30/2022 CLINICAL DATA:  Prostate cancer. Prior brachytherapy (11/09/2020). Elevated PSA equal 72.3 EXAM: NUCLEAR MEDICINE PET SKULL BASE TO THIGH TECHNIQUE: 9.97 mCi F18 Piflufolastat (Pylarify) was injected intravenously. Full-ring PET imaging was performed from the skull base to thigh after the radiotracer. CT data  was obtained and used for attenuation correction and anatomic localization. COMPARISON:  Bone scan 12/08/2021, CT scan 11/25/2021 FINDINGS: NECK No radiotracer activity in neck lymph nodes. Incidental CT finding: None CHEST No radiotracer accumulation within mediastinal or hilar lymph nodes. No suspicious pulmonary nodules on the CT scan. Incidental CT finding: None ABDOMEN/PELVIS Prostate: Focal activity in the anterior RIGHT apical region of the prostate gland concerning for local prostate cancer recurrence (SUV max equal 7 point on image 191). Lymph nodes: No abnormal radiotracer accumulation within pelvic or abdominal nodes. Liver: No evidence of liver metastasis Incidental CT finding: Large RIGHT inguinal hernia with a significant portion of the small intestine extending into the RIGHT hemiscrotum. SKELETON Multifocal intense radiotracer activity within the pelvis and sacrum. Example lesion in the RIGHT iliac bone/acetabulum SUV max equal 12.7. This is a broad lesion measuring 6 cm. Partially lytic lesion the posterior RIGHT iliac bone measuring 2 cm with SUV max equal 14.8. Broad lesion in the near entirety of the LEFT sacral ala with SUV  max equal 16.6. There multiple foci of radiotracer activity within the thoracic spine, lumbar spine and cervical spine. Lesion on comparison bone scan is intensely radiotracer avid in the lower thoracic spine (T10). There is subtle lucency on the CT portion exam. Proximal femurs and humeri involved. Additional lesions in the spine ribs and sternum. Nearly every vertebral body is involved. IMPRESSION: 1. Dominant finding is wide spread intensely radiotracer avid prostate cancer skeletal metastasis in the axillary and appendicular skeleton. Nearly every vertebral body is involved with carcinoma. Broad lesions in the sacrum and pelvis. Minimal change on the CT portion of exam. 2. Focal activity in the anterior apical region of prostate gland is concerning for local recurrence.  3. No metastatic adenopathy or visceral metastasis. Electronically Signed   By: Suzy Bouchard M.D.   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  He has been on finasteride. - Biopsy on 12/24/2013: Right lateral mid-prostatic adenocarcinoma, Gleason 3+3=6 - Biopsy on 06/25/2020: Prostatic adenocarcinoma, T2b, Gleason 4+3=7, PSA 12.8 (adjusted for finasteride) - Bone scan on 07/06/2020 and CT on 07/29/2020 negative. - 11/09/2020: Insertion of radioactive I-125 seeds - PSA 5.4 (6/22), 53.2 (11/17/2021), 66.7 (11/22/2021), 382 (01/12/2022) - CTAP on 11/25/2021 with no evidence of metastatic disease.  New ill-defined soft tissue adjacent to the pancreatic body encasing the proximal splenic artery, sequela of interval pancreatitis although neoplasm is considered.  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 - Bone scan on 12/08/2021 with possible uptake in the lower thoracic vertebral body.  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 - CT chest without contrast on 01/03/2022 with no CT correlate in the lower thoracic spine.  No additional metastatic disease. - Degarelix  240 mg by Alan. Jeffie Pollock on 01/12/2022.    Social/family history: - Lives with wife and granddaughter at home.  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  Quit smoking 50 years ago. - 2 sisters had brain cancer.  Maternal grandmother had cancer.  Niece had breast cancer.   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  Broad lesions in the sacrum and pelvis. - Focal activity in the anterior apical region of the prostate gland.  No metastatic adenopathy or visceral metastasis. - Because of extensive metastatic disease in  the hips, I have ordered x-rays of both hips and reviewed them.  It did not show any evidence of acute fracture. - He received first dose of Firmagon on 01/12/2022.  We talked about initiating him on ARI.  His co-pay for Abiraterone was very high. - We talked about starting him on apalutamide.  We discussed side effects in detail including hypertension, hot flashes, fatigue among others. - We will start him on 180 mg daily for the first month.  If he tolerates well, will increase it to 240 mg daily. - He will start when he receives shipment of apalutamide. - RTC 3 weeks for follow-up with repeat labs.  2.  Bone metastasis: -He has extensive bony metastatic disease. - We talked about initiating him on denosumab monthly to decrease skeletal related events. - He will be seeing his dentist to get clearance prior to start of denosumab. - He reported fall at home last week.  He was reluctant to go to the ER.  He is complaining of lower back pain in the lumbar spine region. - I have done x-rays and reviewed them.  No radiographic evidence of acute fracture to the lumbar spine.  Known metastatic disease.  3.  Atrial fibrillation and history of unprovoked right leg DVT: -Continue warfarin daily.  Last INR 2.9.   Orders placed this encounter:  No orders of the defined types were placed in this encounter.    Derek Jack, MD [place] [phone]   I, Thana Ates, am acting as a scribe for Alan. Derek Jack.  I, Derek Jack MD, have reviewed the above documentation for accuracy and completeness, and I agree with the above.

## 2022-02-03 ENCOUNTER — Encounter (HOSPITAL_COMMUNITY): Payer: Self-pay

## 2022-02-03 ENCOUNTER — Other Ambulatory Visit (HOSPITAL_COMMUNITY): Payer: Self-pay

## 2022-02-03 ENCOUNTER — Other Ambulatory Visit (HOSPITAL_COMMUNITY): Payer: Self-pay | Admitting: Physician Assistant

## 2022-02-03 DIAGNOSIS — G893 Neoplasm related pain (acute) (chronic): Secondary | ICD-10-CM

## 2022-02-03 DIAGNOSIS — C7951 Secondary malignant neoplasm of bone: Secondary | ICD-10-CM

## 2022-02-03 MED ORDER — HYDROCODONE-ACETAMINOPHEN 5-325 MG PO TABS: 1.0000 | ORAL_TABLET | Freq: Four times a day (QID) | ORAL | 0 refills | Status: DC | PRN

## 2022-02-03 NOTE — Progress Notes (Signed)
Sent message to Tarri Abernethy, [place]- Patient's wife calling about pain medication being sent in to [facility] in [place]. Said that they had discussed with Dr. Raliegh Ip yesterday. Patient was reluctant to do any pain medication, he fell Sunday and had xray yesterday which has seemed to exasperate the pain.   Message from [place], [place]- We'll start low then. I will send in for hydrocodone/acetaminophen 5/325, which he can take every 4-6 hours as needed. If he is still having significant pain, please have him call the clinic for further medication adjustment.  Called patient's wife and she is aware discussed possible side effects- lethargy, altered mental status, dizziness and being more prone to fall. She states understanding and is agreeable with plan.

## 2022-02-06 ENCOUNTER — Encounter (HOSPITAL_COMMUNITY): Payer: Self-pay

## 2022-02-06 NOTE — Progress Notes (Signed)
Return call placed to patient's wife and niece regarding recent x-ray results. Results discussed per Dr. Delton Coombes. Reminded patient to utilize pain medication as prescribed. Discussed need to call Dr. Ralene [place] office for follow-up. Stated understanding.

## 2022-02-07 ENCOUNTER — Telehealth (HOSPITAL_COMMUNITY): Payer: Self-pay | Admitting: Pharmacy Technician

## 2022-02-07 NOTE — Telephone Encounter (Signed)
Oral Oncology Patient Advocate Encounter  Met patient in [facility] lobby room to complete application for [employer] PAP in an effort to reduce patient's out of pocket expense for Erleada to $0.    Application completed and faxed to [phone].   [employer] patient assistance phone number for follow up is [phone].   This encounter will be updated until final determination.   [facility] Patient [place] Phone [phone] Fax [phone] 02/07/2022 11:36 AM

## 2022-02-09 ENCOUNTER — Other Ambulatory Visit: Payer: Self-pay

## 2022-02-09 ENCOUNTER — Ambulatory Visit (HOSPITAL_COMMUNITY)
Admission: RE | Admit: 2022-02-09 | Discharge: 2022-02-09 | Disposition: A | Discharge status: Home / Self Care | Payer: No Typology Code available for payment source | Attending: Gastroenterology | Admitting: Gastroenterology

## 2022-02-09 ENCOUNTER — Ambulatory Visit (HOSPITAL_COMMUNITY): Payer: No Typology Code available for payment source | Admitting: Certified Registered Nurse Anesthetist

## 2022-02-09 ENCOUNTER — Ambulatory Visit (HOSPITAL_BASED_OUTPATIENT_CLINIC_OR_DEPARTMENT_OTHER): Payer: No Typology Code available for payment source | Admitting: Certified Registered Nurse Anesthetist

## 2022-02-09 ENCOUNTER — Encounter (HOSPITAL_COMMUNITY)
Admission: RE | Disposition: A | Discharge status: Home / Self Care | Payer: Self-pay | Source: Home / Self Care | Attending: Gastroenterology

## 2022-02-09 DIAGNOSIS — I864 Gastric varices: Secondary | ICD-10-CM | POA: Insufficient documentation

## 2022-02-09 DIAGNOSIS — Z86718 Personal history of other venous thrombosis and embolism: Secondary | ICD-10-CM | POA: Insufficient documentation

## 2022-02-09 DIAGNOSIS — I4891 Unspecified atrial fibrillation: Secondary | ICD-10-CM | POA: Diagnosis not present

## 2022-02-09 DIAGNOSIS — C61 Malignant neoplasm of prostate: Secondary | ICD-10-CM | POA: Insufficient documentation

## 2022-02-09 DIAGNOSIS — Z803 Family history of malignant neoplasm of breast: Secondary | ICD-10-CM | POA: Diagnosis not present

## 2022-02-09 DIAGNOSIS — Z87891 Personal history of nicotine dependence: Secondary | ICD-10-CM | POA: Diagnosis not present

## 2022-02-09 DIAGNOSIS — K317 Polyp of stomach and duodenum: Secondary | ICD-10-CM | POA: Diagnosis not present

## 2022-02-09 DIAGNOSIS — E119 Type 2 diabetes mellitus without complications: Secondary | ICD-10-CM | POA: Diagnosis not present

## 2022-02-09 DIAGNOSIS — Z808 Family history of malignant neoplasm of other organs or systems: Secondary | ICD-10-CM | POA: Diagnosis not present

## 2022-02-09 DIAGNOSIS — I10 Essential (primary) hypertension: Secondary | ICD-10-CM

## 2022-02-09 DIAGNOSIS — K869 Disease of pancreas, unspecified: Secondary | ICD-10-CM

## 2022-02-09 DIAGNOSIS — D630 Anemia in neoplastic disease: Secondary | ICD-10-CM | POA: Diagnosis not present

## 2022-02-09 DIAGNOSIS — C7951 Secondary malignant neoplasm of bone: Secondary | ICD-10-CM | POA: Diagnosis not present

## 2022-02-09 DIAGNOSIS — K8591 Acute pancreatitis with uninfected necrosis, unspecified: Secondary | ICD-10-CM

## 2022-02-09 DIAGNOSIS — K2970 Gastritis, unspecified, without bleeding: Secondary | ICD-10-CM | POA: Diagnosis not present

## 2022-02-09 DIAGNOSIS — Z79899 Other long term (current) drug therapy: Secondary | ICD-10-CM | POA: Diagnosis not present

## 2022-02-09 DIAGNOSIS — Z7901 Long term (current) use of anticoagulants: Secondary | ICD-10-CM | POA: Diagnosis not present

## 2022-02-09 DIAGNOSIS — R933 Abnormal findings on diagnostic imaging of other parts of digestive tract: Secondary | ICD-10-CM | POA: Diagnosis present

## 2022-02-09 HISTORY — PX: EUS: SHX5427

## 2022-02-09 HISTORY — PX: ESOPHAGOGASTRODUODENOSCOPY: SHX5428

## 2022-02-09 HISTORY — PX: BIOPSY: SHX5522

## 2022-02-09 LAB — GLUCOSE, CAPILLARY: Glucose-Capillary: 128 mg/dL — ABNORMAL HIGH (ref 70–99)

## 2022-02-09 SURGERY — UPPER ENDOSCOPIC ULTRASOUND (EUS) RADIAL
Anesthesia: Monitor Anesthesia Care

## 2022-02-09 MED ORDER — PROPOFOL 10 MG/ML IV BOLUS
INTRAVENOUS | Status: DC | PRN
Start: 2022-02-09 — End: 2022-02-09
  Administered 2022-02-09: 20 mg via INTRAVENOUS

## 2022-02-09 MED ORDER — PROPOFOL 500 MG/50ML IV EMUL
INTRAVENOUS | Status: AC
  Filled 2022-02-09: qty 50

## 2022-02-09 MED ORDER — LIDOCAINE 2% (20 MG/ML) 5 ML SYRINGE
INTRAMUSCULAR | Status: DC | PRN
  Administered 2022-02-09: 80 mg via INTRAVENOUS

## 2022-02-09 MED ORDER — SODIUM CHLORIDE 0.9 % IV SOLN: INTRAVENOUS | Status: DC

## 2022-02-09 MED ORDER — PROPOFOL 1000 MG/100ML IV EMUL
INTRAVENOUS | Status: AC
  Filled 2022-02-09: qty 100

## 2022-02-09 MED ORDER — LACTATED RINGERS IV SOLN: INTRAVENOUS | Status: DC

## 2022-02-09 MED ORDER — PROPOFOL 500 MG/50ML IV EMUL
INTRAVENOUS | Status: DC | PRN
Start: 2022-02-09 — End: 2022-02-09
  Administered 2022-02-09: 125 ug/kg/min via INTRAVENOUS

## 2022-02-09 NOTE — Anesthesia Procedure Notes (Signed)
Procedure Name: [place] ?Date/Time: 02/09/2022 9:34 AM ?Performed by: West Pugh, CRNA ?Pre-anesthesia Checklist: Patient identified, Emergency Drugs available, Suction available, Patient being monitored and Timeout performed ?Patient Re-evaluated:Patient Re-evaluated prior to induction ?Oxygen Delivery Method: Simple face mask ?Preoxygenation: Pre-oxygenation with 100% oxygen ?Placement Confirmation: positive ETCO2 ? ? ? ? ?

## 2022-02-09 NOTE — Transfer of Care (Signed)
Immediate Anesthesia Transfer of Care Note ? ?Patient: Alan [name]. ? ?Procedure(s) Performed: UPPER ENDOSCOPIC ULTRASOUND (EUS) RADIAL ?ESOPHAGOGASTRODUODENOSCOPY (EGD) ?BIOPSY ? ?Patient Location: PACU and Endoscopy Unit ? ?Anesthesia Type:MAC ? ?Level of Consciousness: awake, drowsy and patient cooperative ? ?Airway & Oxygen Therapy: Patient Spontanous Breathing and Patient connected to face mask oxygen ? ?Post-op Assessment: Report given to RN and Post -op Vital signs reviewed and stable ? ?Post vital signs: Reviewed and stable ? ?Last Vitals:  ?Vitals Value Taken Time  ?BP 133/57 02/09/22 1010  ?Temp 36.6 ?C 02/09/22 1009  ?Pulse 76 02/09/22 1012  ?Resp 15 02/09/22 1012  ?SpO2 100 % 02/09/22 1012  ?Vitals shown include unvalidated device data. ? ?Last Pain:  ?Vitals:  ? 02/09/22 1009  ?TempSrc: Tympanic  ?PainSc: Asleep  ?   ? ?  ? ?Complications: No notable events documented. ?

## 2022-02-09 NOTE — Anesthesia Postprocedure Evaluation (Signed)
Anesthesia Post Note ? ?Patient: Alan [name]. ? ?Procedure(s) Performed: UPPER ENDOSCOPIC ULTRASOUND (EUS) RADIAL ?ESOPHAGOGASTRODUODENOSCOPY (EGD) ?BIOPSY ? ?  ? ?Patient location during evaluation: PACU ?Anesthesia Type: MAC ?Level of consciousness: awake and alert ?Pain management: pain level controlled ?Vital Signs Assessment: post-procedure vital signs reviewed and stable ?Respiratory status: spontaneous breathing, nonlabored ventilation, respiratory function stable and patient connected to nasal cannula oxygen ?Cardiovascular status: stable and blood pressure returned to baseline ?Postop Assessment: no apparent nausea or vomiting ?Anesthetic complications: no ? ? ?No notable events documented. ? ?Last Vitals:  ?Vitals:  ? 02/09/22 0847 02/09/22 1009  ?BP: (!) 173/66 (!) 133/57  ?Pulse: 88 76  ?Resp: 14 18  ?Temp: 36.5 ?C 36.6 ?C  ?SpO2: 100% 100%  ?  ?Last Pain:  ?Vitals:  ? 02/09/22 1009  ?TempSrc: Tympanic  ?PainSc: Asleep  ? ? ?  ?  ?  ?  ?  ?  ? ?Meriah Shands S ? ? ? ? ?

## 2022-02-09 NOTE — Interval H&P Note (Signed)
History and Physical Interval Note: ? ?02/09/2022 ?10:25 AM ? ?Alan [name].  has presented today for surgery, with the diagnosis of abnormal pancreas, necrotizing pancreatitis.  The various methods of treatment have been discussed with the patient and family. After consideration of risks, benefits and other options for treatment, the patient has consented to  Procedure(s): ?UPPER ENDOSCOPIC ULTRASOUND (EUS) RADIAL (N/A) ?ESOPHAGOGASTRODUODENOSCOPY (EGD) (N/A) ?BIOPSY as a surgical intervention.  The patient's history has been reviewed, patient examined, no change in status, stable for surgery.  I have reviewed the patient's chart and labs.  Questions were answered to the patient's satisfaction.   ? ? ?Milus Banister ? ? ?

## 2022-02-09 NOTE — Anesthesia Preprocedure Evaluation (Signed)
Anesthesia Evaluation  ?Patient identified by MRN, date of birth, ID band ?Patient awake ? ? ? ?Reviewed: ?Allergy & Precautions, NPO status , Patient's Chart, lab work & pertinent test results ? ?Airway ?Mallampati: II ? ?TM Distance: >3 FB ?Neck ROM: Full ? ? ? Dental ?no notable dental hx. ? ?  ?Pulmonary ?neg pulmonary ROS, former smoker,  ?  ?Pulmonary exam normal ?breath sounds clear to auscultation ? ? ? ? ? ? Cardiovascular ?hypertension, Pt. on medications ?Normal cardiovascular exam+ dysrhythmias Atrial Fibrillation + Valvular Problems/Murmurs AI  ?Rhythm:Regular Rate:Normal ? ?HCM ?  ?Neuro/Psych ?negative neurological ROS ? negative psych ROS  ? GI/Hepatic ?negative GI ROS, Neg liver ROS,   ?Endo/Other  ?diabetes, Type 2 ? Renal/GU ?negative Renal ROS  ?negative genitourinary ?  ?Musculoskeletal ?negative musculoskeletal ROS ?(+)  ? Abdominal ?  ?Peds ?negative pediatric ROS ?(+)  Hematology ? ?(+) Blood dyscrasia, anemia ,   ?Anesthesia Other Findings ? ? Reproductive/Obstetrics ?negative OB ROS ? ?  ? ? ? ? ? ? ? ? ? ? ? ? ? ?  ?  ? ? ? ? ? ? ? ? ?Anesthesia Physical ?Anesthesia Plan ? ?ASA: 3 ? ?Anesthesia Plan: MAC  ? ?Post-op Pain Management: Minimal or no pain anticipated  ? ?Induction: Intravenous ? ?PONV Risk Score and Plan: 1 and Propofol infusion and Treatment may vary due to age or medical condition ? ?Airway Management Planned: Simple Face Mask ? ?Additional Equipment:  ? ?Intra-op Plan:  ? ?Post-operative Plan:  ? ?Informed Consent: I have reviewed the patients History and Physical, chart, labs and discussed the procedure including the risks, benefits and alternatives for the proposed anesthesia with the patient or authorized representative who has indicated his/her understanding and acceptance.  ? ? ? ?Dental advisory given ? ?Plan Discussed with: CRNA and Surgeon ? ?Anesthesia Plan Comments:   ? ? ? ? ? ? ?Anesthesia Quick Evaluation ? ?

## 2022-02-09 NOTE — Op Note (Signed)
[facility] ?Patient Name: Alan [name] ?Procedure Date: 02/09/2022 ?MRN: [id number] ?Attending MD: Milus Banister , MD ?Date of Birth: 10-01-1941 ?CSN: [id number] ?Age: 81 ?Admit Type: Inpatient ?Procedure:                Upper EUS ?Indications:              Suspected mass in pancreas on CT scan ?Providers:                Milus Banister, MD, Burtis Junes, RN, Charlean Merl  ?                          Purcell Nails, Technician, Tyna Jaksch Technician,  ?                          Christell Faith, CRNA ?Referring MD:             [name], MD ?Medicines:                Monitored Anesthesia Care ?Complications:            No immediate complications. Estimated blood loss:  ?                          None. ?Estimated Blood Loss:     Estimated blood loss: none. ?Procedure:                Pre-Anesthesia Assessment: ?                          - Prior to the procedure, a History and Physical  ?                          was performed, and patient medications and  ?                          allergies were reviewed. The patient's tolerance of  ?                          previous anesthesia was also reviewed. The risks  ?                          and benefits of the procedure and the sedation  ?                          options and risks were discussed with the patient.  ?                          All questions were answered, and informed consent  ?                          was obtained. Prior Anticoagulants: The patient has  ?                          taken Coumadin (warfarin), last dose was 5 days  ?  prior to procedure. ASA Grade Assessment: III - A  ?                          patient with severe systemic disease. After  ?                          reviewing the risks and benefits, the patient was  ?                          deemed in satisfactory condition to undergo the  ?                          procedure. ?                          After obtaining informed consent, the endoscope was  ?                           passed under direct vision. Throughout the  ?                          procedure, the patient's blood pressure, pulse, and  ?                          oxygen saturations were monitored continuously. The  ?                          [id number]-AL5 ([id number]) Olympus radial ultrasound  ?                          scope was introduced through the mouth, and  ?                          advanced to the duodenal bulb. The upper EUS was  ?                          accomplished without difficulty. The patient  ?                          tolerated the procedure well. ?Scope In: ?Scope Out: ?Findings: ?     ENDOSCOPIC FINDING(with radial EUS scope and standard adult  ?     gastroscope): : ?     Normal esophagus. No esophageal varices. ?     There were multiple friable semipedunculated polyps throughout the  ?     stomach ranging in size from 5 mm to 10 mm. They all appeared  ?     inflammatory and not neoplastic. I sampled one of the polyps with biopsy  ?     forceps. It bled quite briskly afterwards but this stopped without  ?     intervention. ?     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 ?     The duodenal bulb was edematous, slightly erythematous. ?     No mucosal masses in the stomach. ?     EUS findings: ?     1. Very impressive, extensive,  large gastric varices throughout the  ?     gastric wall. See images. ?     2. 2 cm x 2 cm hypoechoic irregularly bordered poorly defined mass  ?     adjacent to the posterior wall of the stomach. This involves the body of  ?     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 There were extensive varicosities  ?     throughout the gastric wall which precluded safe biopsy attempt. The  ?     pancreatic parenchyma was otherwise quite atrophic. ?     3. No perigastric adenopathy, no pancreatic adenopathy ?     4. CBD was normal, nondilated ?     5. Limited views of the liver, spleen were  normal ?Impression:               - 2 cm x 2 cm irregularly bordered, poorly defined  ?                          mass adjacent to the posterior wall of the stomach,  ?                          involving the body of the pancreas. This seems to  ?                          be causing main pancreatic duct obstruction,  ?                          upstream dilation. ?                          - Very impressive, extensive large gastric varices  ?                          throughout the gastric wall. These precluded safe  ?                          FNA of the lesion above. ?                          - No esophageal varices ?                          - The lesion described above might be neoplastic,  ?                          however it did not light up on recent pet scan. ?                          - I sampled one of the numerous small, likely  ?                          inflammatory polyps in his stomach with biopsy. It  ?  bled quite briskly afterwards, undoubtedly there is  ?                          increased vascular pressure affecting the polyp  ?                          related to the multiple extensive large gastric  ?                          varices ?                          - I will communicate these results to Dr. Fuller Plan and  ?                          his oncologist. ?Moderate Sedation: ?     Not Applicable - Patient had care per Anesthesia. ?Recommendation:           - Discharge patient to home. ?                          - OK to resume blood thinner. ?Procedure Code(s):        --- Professional --- ?                          [phone], Esophagogastroduodenoscopy, flexible,  ?                          transoral; with biopsy, single or multiple ?Diagnosis Code(s):        --- Professional --- ?                          K31.7, Polyp of stomach and duodenum ?                          R93.3, Abnormal findings on diagnostic imaging of  ?                          other parts of digestive  tract ?CPT copyright 2019 [employer]. All rights reserved. ?The codes documented in this report are preliminary and upon coder review may  ?be revised to meet current compliance requirements. ?Milus Banister, MD ?02/09/2022 10:21:09 AM ?This report has been signed electronically. ?Number of Addenda: 0 ?

## 2022-02-09 NOTE — Discharge Instructions (Signed)
YOU HAD AN ENDOSCOPIC PROCEDURE TODAY: Refer to the procedure report and other information in the discharge instructions given to you for any specific questions about what was found during the examination. If this information does not answer your questions, please call [facility] office at [phone] to clarify.  ° °YOU SHOULD EXPECT: Some feelings of bloating in the abdomen. Passage of more gas than usual. Walking can help get rid of the air that was put into your GI tract during the procedure and reduce the bloating. If you had a lower endoscopy (such as a colonoscopy or flexible sigmoidoscopy) you may notice spotting of blood in your stool or on the toilet paper. Some abdominal soreness may be present for a day or two, also. ° °DIET: Your first meal following the procedure should be a light meal and then it is ok to progress to your normal diet. A half-sandwich or bowl of soup is an example of a good first meal. Heavy or fried foods are harder to digest and may make you feel nauseous or bloated. Drink plenty of fluids but you should avoid alcoholic beverages for 24 hours. If you had a esophageal dilation, please see attached instructions for diet.   ° °ACTIVITY: Your care partner should take you home directly after the procedure. You should plan to take it easy, moving slowly for the rest of the day. You can resume normal activity the day after the procedure however YOU SHOULD NOT DRIVE, use power tools, machinery or perform tasks that involve climbing or major physical exertion for 24 hours (because of the sedation medicines used during the test).  ° °SYMPTOMS TO REPORT IMMEDIATELY: °A gastroenterologist can be reached at any hour. Please call [phone]  for any of the following symptoms:  °Following lower endoscopy (colonoscopy, flexible sigmoidoscopy) °Excessive amounts of blood in the stool  °Significant tenderness, worsening of abdominal pains  °Swelling of the abdomen that is new, acute  °Fever of 100° or  higher  °Following upper endoscopy (EGD, EUS, ERCP, esophageal dilation) °Vomiting of blood or coffee ground material  °New, significant abdominal pain  °New, significant chest pain or pain under the shoulder blades  °Painful or persistently difficult swallowing  °New shortness of breath  °Black, tarry-looking or red, bloody stools ° °FOLLOW UP:  °If any biopsies were taken you will be contacted by phone or by letter within the next 1-3 weeks. Call [phone]  if you have not heard about the biopsies in 3 weeks.  °Please also call with any specific questions about appointments or follow up tests. ° °

## 2022-02-10 LAB — SURGICAL PATHOLOGY

## 2022-02-12 ENCOUNTER — Encounter (HOSPITAL_COMMUNITY): Payer: Self-pay | Admitting: Gastroenterology

## 2022-02-13 ENCOUNTER — Telehealth: Payer: Self-pay | Admitting: Licensed Clinical Social Worker

## 2022-02-13 ENCOUNTER — Telehealth: Payer: No Typology Code available for payment source

## 2022-02-13 NOTE — Telephone Encounter (Signed)
?  Chronic Care Management  ?  ? Clinical Social Work Note ?  ?02/13/22 ?Name: Alan [name].            MRN: [id number]       DOB: 1941/01/06 ?  ?Alan [name]. is a 81 y.o. year old male who is a primary care patient of Dettinger, Fransisca Kaufmann, MD. The CCM team was consulted to assist the patient with chronic disease management and/or care coordination needs related to: [employer] .  ?  ?LCSW was not able to speak with client today via phone. LCSW was not able to leave client phone message today ? ?Follow Up Plan:  LCSW to call client  on 04/06/22 at 11:00 AM to assess client needs ? ?Norva Riffle.Verneta Hamidi MSW, LCSW ?Licensed Clinical Social Worker ?[facility] ?[phone] ?

## 2022-02-16 ENCOUNTER — Encounter: Payer: Self-pay | Admitting: Urology

## 2022-02-16 ENCOUNTER — Other Ambulatory Visit: Payer: Self-pay | Admitting: Family Medicine

## 2022-02-16 ENCOUNTER — Ambulatory Visit: Payer: No Typology Code available for payment source | Admitting: Family Medicine

## 2022-02-16 ENCOUNTER — Ambulatory Visit (INDEPENDENT_AMBULATORY_CARE_PROVIDER_SITE_OTHER): Payer: No Typology Code available for payment source | Admitting: Urology

## 2022-02-16 ENCOUNTER — Other Ambulatory Visit: Payer: Self-pay

## 2022-02-16 VITALS — BP 158/60 | HR 62

## 2022-02-16 DIAGNOSIS — C7951 Secondary malignant neoplasm of bone: Secondary | ICD-10-CM

## 2022-02-16 DIAGNOSIS — R351 Nocturia: Secondary | ICD-10-CM

## 2022-02-16 DIAGNOSIS — I152 Hypertension secondary to endocrine disorders: Secondary | ICD-10-CM

## 2022-02-16 DIAGNOSIS — N403 Nodular prostate with lower urinary tract symptoms: Secondary | ICD-10-CM | POA: Diagnosis not present

## 2022-02-16 DIAGNOSIS — C61 Malignant neoplasm of prostate: Secondary | ICD-10-CM

## 2022-02-16 MED ORDER — DEGARELIX ACETATE 80 MG ~~LOC~~ SOLR: 80.0000 mg | Freq: Once | SUBCUTANEOUS | Status: DC

## 2022-02-16 NOTE — Progress Notes (Signed)
Subjective:  1. Prostate cancer (Alan [name])   2. 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 Nodular prostate with lower urinary tract symptoms   4. Nocturia      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   His PSA has declined to 5.4 from 8.1 in 3/22.  He is voiding ok with moderate LUTS and an IPSS of 14 with nocturia x 3.   He has had no further gross hematuria and his UA is unremarkable today.   He remains on warfarin and his INR is 2.4 on 05/12/21.     On initial biopsy, he had only 5% in one core and elected active survelliance. He had a repeat biopsy on 12/24/13 that showed a single core with <5% Gleason 6 in the right mid lateral prostate. His last biopsy was on 06/28/18 and only showed 2 cores with atypia. He is on finasteride and has had  a slowly rising PSA that was up to 6.4 prior to his most recent biopsy on 06/25/20.  The nadir on finasteride was 1.8.    The repeat biopsy demonstrated 6 positive cores with 2 cores with 7(4+3) on the right and 4 cores with low volume Gleason 6 disease.    His prostate volume is 75m. IPSS is 9.  A bone scan on 07/06/20 was negative.    The CT on 07/29/20 was negative for mets.   His PSA was 6.2 prior to therapy starting finasteride.   11/24/21:  Alan [name] today in f/u for the history above.  His PSA was 5.4 in 6/22 but a repeat prior to this visit was 53.2 on 11/17/21 and then 66.7 on 11/22/21.  He has no weight loss but he has some new hip pain for the last 3 weeks.  He has no new voiding complaint but he has urgency with UUI.  His UA is unremarkable today.   01/12/22: Alan [name] today in f/u.  He was found to have a rapidly rising PSA and was restaged.  The bone scan showed a possible thoracic vertebral met  but this was not confirmed by Chest CT.   The CT [facility] showed no evidence of mets but a new peripancreatic process so GI was consulted.   An abdominal MRI was obtained that showed multiple mets in the pelvic bones.   He is scheduled for upper endoscopy for the pancreatic lesion on 02/09/22.    02/16/22: Alan [name] today in f/u for his history of metastatic prostate cancer.  He got an initial dose of Firmagon on 01/12/22.   His PSA was 382 prior to the shot.   He had labs on 01/26/22 with Oncology and his PSA was down to 72 but he has an elevated Alk phos of 392 and a hgb of 9.5.  Apalutamide has been prescribed and he is tolerating it well without fatigue but he has some constipation.  He has no hot flashes.  He has back pain after a fall about 2 weeks ago and has to sleep sitting up but it is slowly improving but he had no fractures.   He had an upper endoscopy last week and had a negative biopsy in the stomach.  He didn't have a biopsy of the pancreas.  He eats well.   He is due for firmagon 51m today and has f/u on 3/20 with Alan [name]       ROS:  ROS:  A complete review of systems was performed.  All systems are negative except for pertinent findings as noted.   ROS  Allergies  Allergen Reactions   Allopurinol Other (See Comments)    Significantly decreased WBC's   Colchicine Other (See Comments)    Significantly decreased WBC's    Outpatient Encounter Medications as of 02/16/2022  Medication Sig Note   acetaminophen (TYLENOL) 500 MG tablet Take 1,000 mg by mouth every 6 (six) hours as needed for moderate pain.    amLODipine (NORVASC) 10 MG tablet TAKE 1 TABLET BY MOUTH ONCE DAILY IN THE EVENING    apalutamide (ERLEADA) 60 MG tablet Take 4 tablets (240 mg total) by mouth daily. Take as directed. 02/02/2022: Have not received - New Med    Blood Glucose Monitoring Suppl (ONE TOUCH ULTRA MINI) w/Device KIT 1 each by Does not apply route 2 (two) times daily.    chlorthalidone (HYGROTON)  25 MG tablet Take 1 tablet (25 mg total) by mouth daily. Please make overdue appt with Dr. STamala Julianbefore anymore refills. Thank you 1st attempt    diclofenac Sodium (VOLTAREN) 1 % GEL Apply 1 application topically 2 (two) times daily as needed (pain).    FEROSUL 325 (65 Fe) MG tablet Take 1 tablet by mouth once daily with breakfast    fluticasone (FLONASE) 50 MCG/ACT nasal spray Place 1 spray into both nostrils 2 (two) times daily as needed for allergies or rhinitis.    glucose blood (ONE TOUCH ULTRA TEST) test strip Use to check BG once daily.  Dx:  Type 2 DM controlled E11.9    HYDROcodone-acetaminophen (NORCO/VICODIN) 5-325 MG tablet Take 1 tablet by mouth every 6 (six) hours as needed for moderate pain. Can take every 4 hours if needed.    metFORMIN (GLUCOPHAGE) 1000 MG tablet Take 1 tablet (1,000 mg total) by mouth 2 (two) times daily with a meal.    metoprolol succinate (TOPROL-XL) 100 MG 24 hr tablet TAKE 1 TABLET BY MOUTH ONCE DAILY WITH OR IMMEDIATELY FOLLOWING A MEAL    Multiple Vitamins-Minerals (CENTRUM SILVER 50+MEN) TABS Take 1 tablet by mouth daily.    omeprazole (PRILOSEC) 20 MG capsule Take 1 capsule (20 mg total) by mouth daily.    potassium chloride SA (KLOR-CON M) 20 MEQ tablet TAKE 1  BY MOUTH ONCE DAILY    pravastatin (PRAVACHOL) 40 MG tablet Take 1 tablet (40 mg total) by mouth every evening.    warfarin (COUMADIN) 5 MG tablet TAKE 1 & 1/2 (ONE & ONE-HALF) TABLETS BY MOUTH ONCE DAILY 6 IN THE EVENING (Patient taking differently: Take 7.5 mg by mouth daily at 4 PM. 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   found during hospital stay with pancreatitis,  started coumadin;   last Abd CT in epic 07-29-2020  chronic thrombus portal venous   GERD (gastroesophageal reflux disease)    Heart murmur    History of acute pancreatitis    2016   necrotizing pancreatitis   History of adenomatous polyp of colon    History of DVT of lower extremity    History of GI bleed 08/2019   upper gi bleed due to duodenal ulcer   History of gout yrs ago   Hypertension    followed by pcp   Hypertrophic cardiomyopathy ([place])    followed by cardiology   Internal hemorrhoids    Mixed hyperlipidemia    Normocytic anemia    PAF (paroxysmal atrial fibrillation) ([place]) 04/2016   cardiologist-- dr h. Tamala Julian--  event monitor 07-04-2017 epic, NSR/ PAflutter with occasional RVR/  PACs/ PVCs ;  echo 05-16-2017 epic,  moderate LVH with severe asymptomic septal hypertrophy, G1DD, EF 60-65%,  mild AR, mild LAE   Pancreatic pseudocyst    Peyronie's disease    Prostate cancer [facility]) urologist-- dr Bobbi Yount/ oncologist-- dr Tammi Klippel   first dx 08/ 2009 Gleason 3+3 active survelliance;  until bx 07/ 2021  Stage T2b, Gleason 4+3   Right inguinal hernia    Type 2 diabetes mellitus ([place])    followed by pcp---  (11-03-2020 per pt currently not checking blood sugar due to glucose monitor broken)   Wears glasses    Wears partial dentures    upper and lower    Past Surgical History:  Procedure Laterality Date   BIOPSY  02/09/2022   Procedure: BIOPSY;  Surgeon: Milus Banister, MD;  Location: [facility];  Service: Endoscopy;;   CHOLECYSTECTOMY N/A 11/26/2015   Procedure: LAPAROSCOPIC CHOLECYSTECTOMY;  Surgeon: Aviva Signs, MD;  Location: [facility];  Service: General;  Laterality: N/A;   COLONOSCOPY  last one 09-06-2020  dr stark   CYSTOSCOPY N/A 11/09/2020   Procedure: Erlene Quan;  Surgeon: Irine Seal, MD;  Location: [facility];  Service: Urology;  Laterality: N/A;   ESOPHAGOGASTRODUODENOSCOPY  10-06-2019  _0    ESOPHAGOGASTRODUODENOSCOPY N/A 02/09/2022   Procedure: ESOPHAGOGASTRODUODENOSCOPY (EGD);  Surgeon: Milus Banister, MD;  Location: [facility];  Service: Endoscopy;  Laterality: N/A;   EUS N/A 02/09/2022   Procedure: UPPER ENDOSCOPIC ULTRASOUND (EUS) RADIAL;  Surgeon: Milus Banister, MD;  Location: [facility];  Service: Endoscopy;  Laterality: N/A;   FLEXIBLE BRONCHOSCOPY Bilateral 04/14/2016   Procedure: FLEXIBLE BRONCHOSCOPY;  Surgeon: Sinda Du, MD;  Location[facility];  Service: Cardiopulmonary;  Laterality: Bilateral;   HEMORRHOID SURGERY  yrs ago   RADIOACTIVE SEED IMPLANT N/A 11/09/2020   Procedure: RADIOACTIVE SEED IMPLANT/BRACHYTHERAPY IMPLANT;  Surgeon: Irine Seal, MD;  [facility];  Service: Urology;  Laterality: N/A;   SPACE OAR INSTILLATION N/A 11/09/2020   Procedure: SPACE OAR INSTILLATION;  Surgeon: Irine Seal, MD;  Location: [facility];  Service: Urology;  Laterality: N/A;   UMBILICAL HERNIA REPAIR N/A 11/26/2015   Procedure: UMBILICAL HERNIORRHAPHY;  Surgeon: Aviva Signs, MD;  Location: [facility];  Service: General;  Laterality: N/A;    Social History   Socioeconomic History   Marital status: Married    Spouse name: [name]   Number of children: 4   Years of education: Not on file   Highest education level: 10th grade  Occupational History   Occupation: Retired     Fish farm manager: [employer]  Tobacco Use   Smoking status: Former  Years: 5.00    Types: Cigarettes    Quit date: 12/11/1986    Years since quitting: 35.2   Smokeless tobacco: Never  Vaping Use   Vaping Use: Never used  Substance and Sexual Activity   Alcohol use: No   Drug use: Never   Sexual activity: Yes  Other Topics Concern   Not on file  Social History Narrative   Not on file   Social Determinants of Health    Financial Resource Strain: Low Risk    Difficulty of Paying Living Expenses: Not hard at all  Food Insecurity: No Food Insecurity   Worried About Charity fundraiser in the Last Year: Never true   [place] in the Last Year: Never true  Transportation Needs: No Transportation Needs   Lack of Transportation (Medical): No   Lack of Transportation (Non-Medical): No  Physical Activity: Inactive   Days of Exercise per Week: 0 days   Minutes of Exercise per Session: 0 min  Stress: Stress Concern Present   Feeling of Stress : To some extent  Social Connections: Socially Integrated   Frequency of Communication with Friends and Family: Three times a week   Frequency of Social Gatherings with Friends and Family: Three times a week   Attends Religious Services: More than 4 times per year   Active Member of Clubs or Organizations: Yes   Attends Music therapist: More than 4 times per year   Marital Status: Married  Human resources officer Violence: Not At Risk   Fear of Current or Ex-Partner: No   Emotionally Abused: No   Physically Abused: No   Sexually Abused: No    Family History  Problem Relation Age of Onset   Stroke Mother    Hypertension Mother    Alzheimer's disease Mother    Hypertension Father    Brain cancer Sister    Breast cancer Sister    Hypertension Sister    Deep vein thrombosis Brother    Hypertension Brother    Colon cancer Neg Hx    Pancreatic disease Neg Hx    Esophageal cancer Neg Hx    Rectal cancer Neg Hx    Stomach cancer Neg Hx        Objective: Vitals:   02/16/22 1426  BP: (!) 158/60  Pulse: 62     Physical Exam  Latest Reference Range & Units 01/26/22 14:23  Prostatic Specific Antigen 0.00 - 4.00 ng/mL 72.30 (H)  (H): Data is abnormally high  Lab Results  Component Value Date   PSA1 382.0 (H) 01/12/2022   PSA1 66.7 (H) 11/22/2021   PSA1 53.2 (H) 11/17/2021    Lab Results:  No results found for this or any previous  visit (from the past 24 hour(s)).    BMET No results for input(s): NA, K, CL, CO2, GLUCOSE, BUN, CREATININE, CALCIUM in the last 72 hours. PSA PSA  Date Value Ref Range Status  05/20/2020 6.4 (H) < OR = 4.0 ng/mL Final    Comment:    The total PSA value from this assay system is  standardized against the WHO standard. The test  result will be approximately 20% lower when compared  to the equimolar-standardized total PSA (Beckman  Coulter). Comparison of serial PSA results should be  interpreted with this fact in mind. . This test was performed using the Siemens  chemiluminescent method. Values obtained from  different assay methods cannot be used interchangeably. PSA levels, regardless of value, should  not be interpreted as absolute evidence of the presence or absence of disease.   11/12/2014 2.0 0.0 - 4.0 ng/mL Final    Comment:    Roche ECLIA methodology. According to the American Urological Association, Serum PSA should decrease and remain at undetectable levels after radical prostatectomy. The AUA defines biochemical recurrence as an initial PSA value 0.2 ng/mL or greater followed by a subsequent confirmatory PSA value 0.2 ng/mL or greater. Values obtained with different assay methods or kits cannot be used interchangeably. Results cannot be interpreted as absolute evidence of the presence or absence of malignant disease.    Testosterone  Date Value Ref Range Status  01/12/2022 625 264 - 916 ng/dL Final    Comment:    Adult male reference interval is based on a population of healthy nonobese males (BMI <30) between 30 and 69 years old. [place], [place] [phone]. PMID: [id number].     UA has 0-2 RBC's  Studies/Results: No results found. DG Lumbar Spine Complete  Result Date: 02/02/2022 CLINICAL DATA:  Provided history: Prostate cancer. History of recent fall. Recent fall, low back pain. EXAM: LUMBAR SPINE - COMPLETE 4+ VIEW COMPARISON:  PET-CT  01/27/2022.  CT abdomen/pelvis 11/25/2021. FINDINGS: 5 lumbar vertebrae. The caudal most well-formed intervertebral disc space is designated L5-S1. No significant spondylolisthesis. Widespread osseous metastatic disease was better appreciated on the recent prior PET-CT of 01/27/2022. Vertebral body height is maintained. No radiographic evidence of acute fracture to the lumbar spine. Multilevel disc space narrowing. Most notably, there is mild-to-moderate disc space narrowing at L4-L5 and L5-S1. Multilevel endplate spurring/osteophytic ridging, greatest at L4-L5 and L5-S1. 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 Facet arthrosis throughout the lumbar and visualized lower thoracic spine. Possible facet joint ankylosis on the right at T12-L1 and L1-L2, better appreciated on prior CT examinations. Aortic atherosclerosis. Brachytherapy seeds within the prostate. Surgical clips within the right upper quadrant of the abdomen. IMPRESSION: No radiographic evidence of acute fracture to the lumbar spine. Known widespread osseous metastatic disease was better appreciated on the recent prior PET-CT of 01/27/2022. Lumbar and lower thoracic spondylosis as described, and greatest at L4-L5 and L5-S1. 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 Suspected facet joint ankylosis on the right at T12-L1 and L1-L2, better appreciated on prior CT examinations. Aortic Atherosclerosis (ICD10-I70.0). Electronically Signed   By: Kellie Simmering D.O.   On: 02/02/2022 16:50   CT CHEST WO CONTRAST  Result Date: 01/04/2022 CLINICAL DATA:  Prostate cancer, assess treatment response. EXAM: CT CHEST WITHOUT CONTRAST TECHNIQUE: Multidetector CT imaging of the chest was performed following the  standard protocol without IV contrast. RADIATION DOSE REDUCTION: This exam was performed according to the departmental dose-optimization program which includes automated exposure control, adjustment of the mA and/or kV according to patient size and/or use of iterative reconstruction technique. COMPARISON:  Bone scan 12/08/2021, MR abdomen 12/08/2021, CT abdomen pelvis 11/25/2021 and CT chest 04/12/2016. FINDINGS: Cardiovascular: Atherosclerotic calcification of the aorta and coronary arteries. Pulmonic trunk is enlarged. Heart is at the upper limits of normal in size. No pericardial effusion. Mediastinum/Nodes: No pathologically enlarged mediastinal or axillary lymph nodes. Hilar regions are difficult to definitively evaluate without IV contrast. There may be distal esophageal wall thickening which can be seen with gastroesophageal reflux. Lungs/Pleura: Lungs are clear. No pleural fluid. Airway is unremarkable. Upper Abdomen: Visualized portions of  the liver, adrenal glands, kidneys, spleen and pancreas are unremarkable. Small hiatal hernia. Haziness in the left upper quadrant, better seen on 11/25/2021. Musculoskeletal: Degenerative changes in the spine. No worrisome lytic or sclerotic lesions. IMPRESSION: 1. No CT correlate in the lower thoracic spine to correspond to the focal radiotracer uptake on bone scan 12/08/2021. No additional evidence of metastatic disease. 2. Haziness in the left upper quadrant, better evaluated on MR abdomen 12/08/2021. 3. Aortic atherosclerosis (ICD10-I70.0). Coronary artery calcification. 4. Enlarged pulmonic trunk, indicative of pulmonary arterial hypertension. Electronically Signed   By: Lorin Picket M.D.   On: 01/04/2022 11:46   NM Bone Scan Whole Body  Addendum Date: 02/12/2022   ADDENDUM REPORT: 02/12/2022 09:20 ADDENDUM: Upon further review, there are sacral bone metastases evident on same day MRI dated 12/08/2021, corresponding to the intense radiotracer uptake near the  SI joints. Additionally, there is intense radiotracer uptake along the right iliac bone/acetabulum and smaller lesions in the pelvis consistent with metastases. Typographical error in original report: Radiotracer uptake in the lower thoracic spine is on the LEFT side, not the right. Electronically Signed   By: Maurine Simmering M.D.   On: 02/12/2022 09:20   Result Date: 02/12/2022 CLINICAL DATA:  Prostate cancer, monitor prostate cancer restage EXAM: NUCLEAR MEDICINE WHOLE BODY BONE SCAN TECHNIQUE: Whole body anterior and posterior images were obtained approximately 3 hours after intravenous injection of radiopharmaceutical. RADIOPHARMACEUTICALS:  21 mCi Technetium-67mMDP IV COMPARISON:  Bone scan 07/06/2020 FINDINGS: There is new focal radiotracer uptake within the right aspect of the a lower thoracic vertebral body likely within the posterior elements. 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 There is a large right inguinal hernia with radiotracer accumulating in a linear and irregular pattern. Additional mild radiotracer uptake within the bilateral shoulders, elbows, wrists, knees, ankles, and spine consistent with arthritis. IMPRESSION: New focal radiotracer uptake within the right aspect of a lower thoracic vertebral body, possibly within the posterior elements concerning for metastasis. Recommend chest CT. 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 Large right inguinal hernia. Electronically Signed: By: JMaurine SimmeringM.D. On: 12/09/2021 15:02   CT Abdomen Pelvis W Contrast  Result Date: 11/25/2021 CLINICAL DATA:  Prostate cancer EXAM: CT ABDOMEN AND PELVIS WITH CONTRAST TECHNIQUE: Multidetector CT imaging of the abdomen and pelvis was performed using the standard protocol following bolus administration of  intravenous contrast. CONTRAST:  1056mOMNIPAQUE IOHEXOL 300 MG/ML  SOLN COMPARISON:  CT abdomen and pelvis with contrast dated July 29, 2020 FINDINGS: Lower chest: No acute abnormality. Hepatobiliary: No suspicious liver lesions. Cholecystectomy clips. Mild biliary ductal dilation, not unexpected status post cholecystectomy. Pancreas: Atrophy of the pancreatic body and tail with unchanged dilation of the main pancreatic duct measuring up to 6 mm at the pancreatic neck. Interval decreased size of pancreatic duodenal groove fluid collection, measuring to 7 mm on series 2, image 36, previously measured up to 2.3 mm. Compared to prior exam there is increased ill-defined soft tissue about the proximal splenic artery and pancreatic body, for example series 2, image 27 measuring approximately 2.7 x 2.2 cm. Splenic vein and SMV occlusion with numerous collaterals of the upper abdomen, similar to prior exam. Spleen: Normal in size without focal abnormality. Adrenals/Urinary Tract: Bilateral adrenal glands are unremarkable. Kidneys enhance symmetrically with no evidence of hydronephrosis or nephrolithiasis. Bladder  wall thickening. Stomach/Bowel: Small hiatal hernia. Normal appendix. Diverticulosis. Mild duodenal wall thickening. No evidence of obstruction. Vascular/Lymphatic: Aortic atherosclerosis. No enlarged abdominal or pelvic lymph nodes. Reproductive: Prostatomegaly with numerous brachytherapy seeds. Other: Large right inguinal hernia containing fluid and nondilated loops of bowel. Musculoskeletal: No acute or significant osseous findings. IMPRESSION: 1. No evidence of metastatic disease in the abdomen or pelvis. 2. New ill-defined soft tissue is seen adjacent to the pancreatic body and encasing the proximal splenic artery, possibly sequela of interval pancreatitis, although pancreatic neoplasm could have a similar appearance. Recommend further evaluation with contrast-enhanced MRCP. 3. Sequela of chronic  pancreatitis including resolving pseudocyst of the pancreatic duodenal groove and chronic splenic/SMV thrombosis. 4.  Aortic Atherosclerosis (ICD10-I70.0). Electronically Signed   By: Yetta Glassman M.D.   On: 11/25/2021 17:18   MR 3D Recon At Scanner  Result Date: 12/09/2021 CLINICAL DATA:  Indeterminate peripancreatic soft tissue on recent CT study. History of necrotizing pancreatitis. History of prostate cancer with rising PSA. EXAM: MRI ABDOMEN WITHOUT AND WITH CONTRAST (INCLUDING MRCP) TECHNIQUE: Multiplanar multisequence MR imaging of the abdomen was performed both before and after the administration of intravenous contrast. Heavily T2-weighted images of the biliary and pancreatic ducts were obtained, and three-dimensional MRCP images were rendered by post processing. CONTRAST:  61m GADAVIST GADOBUTROL 1 MMOL/ML IV SOLN COMPARISON:  11/25/2021 CT abdomen/pelvis. FINDINGS: Lower chest: No acute abnormality at the lung bases. Hepatobiliary: Normal liver size and configuration. No hepatic steatosis. A few scattered small indistinct arterial phase foci of hyperenhancement at the periphery of the liver (for example on series 18/image 53 in the inferior right liver) are occult on all other sequences, most compatible with benign transient perfusional phenomena. No liver masses. Cholecystectomy. No significant intrahepatic biliary ductal dilatation. Common bile duct diameter 5 mm. Chronic smooth narrowing of the mid to distal common bile duct in the region of the pancreatic head. No choledocholithiasis. No biliary masses or beading. Pancreas: Chronic atrophy of the pancreatic body and tail with associated dilated pancreatic duct (7 mm diameter) with abrupt pancreatic duct caliber transition at the level of the pancreatic neck. 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 Persistent  ill-defined enhancing soft tissue between the proximal stomach and pancreatic body encasing the proximal splenic artery, spanning approximately 4.6 x 2.6 cm (series 8/image 21) with associated restricted diffusion (series 12/images 13-15) and heterogeneous T2 hypointensity, similar in extent to the 11/25/2021 CT abdomen/pelvis study, largely new since 07/29/2020 CT. No peripancreatic fluid collections. No pancreas divisum. Spleen: Normal size. No mass. Adrenals/Urinary Tract: Normal adrenals. No hydronephrosis. Subcentimeter simple renal cortical cysts scattered in both kidneys. No suspicious renal masses. Stomach/Bowel: Small hiatal hernia. Otherwise normal nondistended stomach. Visualized small and large bowel is normal caliber, with no bowel wall thickening. Moderate left colonic diverticulosis. Partially visualized bowel containing large right inguinal hernia. Vascular/Lymphatic: Atherosclerotic nonaneurysmal abdominal aorta. Patent hepatic and renal veins. No pathologically enlarged lymph nodes in the abdomen. Other: No abdominal ascites or focal fluid collection. Musculoskeletal: Several large heterogeneously enhancing bone lesions scattered throughout the visualized pelvis, with representative 6.9 cm right superior acetabular lesion (series 24/image 38), 5.2 cm superior left sacral ala lesion (series 24/image 22), 2.5 cm superior right sacral ala lesion (series 24/image 22) and posteromedial superior right iliac bone 3.0 cm lesion (series 24/image 11). IMPRESSION: 1. Several large heterogeneously enhancing bone lesions scattered throughout the visualized pelvis as  detailed, compatible with osseous metastases presumably from patient's prostate cancer. 2. 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 Chronic atrophy and duct dilation in the pancreatic body and tail. 3. Persistent indeterminate  ill-defined T2 hypointense enhancing soft tissue located between the proximal stomach and pancreatic body, spanning approximately 4.6 x 2.6 cm, similar in extent to the 11/25/2021 CT abdomen/pelvis study, largely new since 11/25/2021 CT. Differential includes progressive fibrosis versus pancreatic neoplasm. Suggest GI consultation for consideration of upper endoscopic ultrasound. 4. Bile ducts are stable and within normal post cholecystectomy limits with CBD diameter 5 mm. 5. Small hiatal hernia. 6. Moderate left colonic diverticulosis. 7. Partially visualized large bowel containing right inguinal hernia. Electronically Signed   By: Ilona Sorrel M.D.   On: 12/09/2021 10:01   MR ABDOMEN MRCP W WO CONTAST  Result Date: 12/09/2021 CLINICAL DATA:  Indeterminate peripancreatic soft tissue on recent CT study. History of necrotizing pancreatitis. History of prostate cancer with rising PSA. EXAM: MRI ABDOMEN WITHOUT AND WITH CONTRAST (INCLUDING MRCP) TECHNIQUE: Multiplanar multisequence MR imaging of the abdomen was performed both before and after the administration of intravenous contrast. Heavily T2-weighted images of the biliary and pancreatic ducts were obtained, and three-dimensional MRCP images were rendered by post processing. CONTRAST:  52m GADAVIST GADOBUTROL 1 MMOL/ML IV SOLN COMPARISON:  11/25/2021 CT abdomen/pelvis. FINDINGS: Lower chest: No acute abnormality at the lung bases. Hepatobiliary: Normal liver size and configuration. No hepatic steatosis. A few scattered small indistinct arterial phase foci of hyperenhancement at the periphery of the liver (for example on series 18/image 53 in the inferior right liver) are occult on all other sequences, most compatible with benign transient perfusional phenomena. No liver masses. Cholecystectomy. No significant intrahepatic biliary ductal dilatation. Common bile duct diameter 5 mm. Chronic smooth narrowing of the mid to distal common bile duct in the region of  the pancreatic head. No choledocholithiasis. No biliary masses or beading. Pancreas: Chronic atrophy of the pancreatic body and tail with associated dilated pancreatic duct (7 mm diameter) with abrupt pancreatic duct caliber transition at the level of the pancreatic neck. 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 Persistent ill-defined enhancing soft tissue between the proximal stomach and pancreatic body encasing the proximal splenic artery, spanning approximately 4.6 x 2.6 cm (series 8/image 21) with associated restricted diffusion (series 12/images 13-15) and heterogeneous T2 hypointensity, similar in extent to the 11/25/2021 CT abdomen/pelvis study, largely new since 07/29/2020 CT. No peripancreatic fluid collections. No pancreas divisum. Spleen: Normal size. No mass. Adrenals/Urinary Tract: Normal adrenals. No hydronephrosis. Subcentimeter simple renal cortical cysts scattered in both kidneys. No suspicious renal masses. Stomach/Bowel: Small hiatal hernia. Otherwise normal nondistended stomach. Visualized small and large bowel is normal caliber, with no bowel wall thickening. Moderate left colonic diverticulosis. Partially visualized bowel containing large right inguinal hernia. Vascular/Lymphatic: Atherosclerotic nonaneurysmal abdominal aorta. Patent hepatic and renal veins. No pathologically enlarged lymph nodes in the abdomen. Other: No abdominal ascites or focal fluid collection. Musculoskeletal: Several large heterogeneously enhancing bone lesions scattered throughout the visualized pelvis, with representative 6.9 cm right superior acetabular lesion (series 24/image 38), 5.2 cm superior left sacral ala lesion (series 24/image 22), 2.5 cm superior right sacral ala lesion (series 24/image 22) and posteromedial superior right iliac bone 3.0 cm lesion (series 24/image 11). IMPRESSION:  1.  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 2. 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 Chronic atrophy and duct dilation in the pancreatic body and tail. 3. Persistent indeterminate ill-defined T2 hypointense enhancing soft tissue located between the proximal stomach and pancreatic body, spanning approximately 4.6 x 2.6 cm, similar in extent to the 11/25/2021 CT abdomen/pelvis study, largely new since 11/25/2021 CT. Differential includes progressive fibrosis versus pancreatic neoplasm. Suggest GI consultation for consideration of upper endoscopic ultrasound. 4. Bile ducts are stable and within normal post cholecystectomy limits with CBD diameter 5 mm. 5. Small hiatal hernia. 6. Moderate left colonic diverticulosis. 7. Partially visualized large bowel containing right inguinal hernia. Electronically Signed   By: Ilona Sorrel M.D.   On: 12/09/2021 10:01   DG HIPS BILAT W OR W/O PELVIS 3-4 VIEWS  Result Date: 02/02/2022 CLINICAL DATA:  Provided history: Prostate cancer. History of recent fall. Low back pain, recent back pain. EXAM: DG HIP (WITH OR WITHOUT PELVIS) 3-4V BILAT COMPARISON:  PET CT 01/27/2022.  CT abdomen/pelvis 11/25/2021. FINDINGS: There is normal bony alignment. No acute fracture is identified. Known widespread osseous metastatic disease was better appreciated on the recent prior PET-CT of 02/02/2022. Bilateral femoroacetabular joint osteoarthritic changes (moderate on the right, mild on the left). Lower lumbar spondylosis. Numerous brachytherapy seeds within the prostate. IMPRESSION: No evidence of acute fracture. Known widespread osseous metastatic disease was better appreciated on the recent prior PET-CT of 02/02/2022. Bilateral  femoroacetabular joint osteoarthritic changes (moderate on the right, mild on the left). Electronically Signed   By: Kellie Simmering D.O.   On: 02/02/2022 16:53   NM PET (PSMA) SKULL TO MID THIGH  Result Date: 01/30/2022 CLINICAL DATA:  Prostate cancer. Prior brachytherapy (11/09/2020). Elevated PSA equal 72.3 EXAM: NUCLEAR MEDICINE PET SKULL BASE TO THIGH TECHNIQUE: 9.97 mCi F18 Piflufolastat (Pylarify) was injected intravenously. Full-ring PET imaging was performed from the skull base to thigh after the radiotracer. CT data was obtained and used for attenuation correction and anatomic localization. COMPARISON:  Bone scan 12/08/2021, CT scan 11/25/2021 FINDINGS: NECK No radiotracer activity in neck lymph nodes. Incidental CT finding: None CHEST No radiotracer accumulation within mediastinal or hilar lymph nodes. No suspicious pulmonary nodules on the CT scan. Incidental CT finding: None ABDOMEN/PELVIS Prostate: Focal activity in the anterior RIGHT apical region of the prostate gland concerning for local prostate cancer recurrence (SUV max equal 7 point on image 191). Lymph nodes: No abnormal radiotracer accumulation within pelvic or abdominal nodes. Liver: No evidence of liver metastasis Incidental CT finding: Large RIGHT inguinal hernia with a significant portion of the small intestine extending into the RIGHT hemiscrotum. SKELETON Multifocal intense radiotracer activity within the pelvis and sacrum. Example lesion in the RIGHT iliac bone/acetabulum SUV max equal 12.7. This is a broad lesion measuring 6 cm. Partially lytic lesion the posterior RIGHT iliac bone measuring 2 cm with SUV max equal 14.8. Broad lesion in the near entirety of the LEFT sacral ala with SUV max equal 16.6. There multiple foci of radiotracer activity within the thoracic spine, lumbar spine and cervical spine. Lesion on comparison bone scan is intensely radiotracer avid in the lower thoracic spine (T10). There is subtle lucency on the CT  portion exam. Proximal femurs and humeri involved. Additional lesions in the spine ribs and sternum.  Nearly every vertebral body is involved. IMPRESSION: 1. Dominant finding is wide spread intensely radiotracer avid prostate cancer skeletal metastasis in the axillary and appendicular skeleton. Nearly every vertebral body is involved with carcinoma. Broad lesions in the sacrum and pelvis. Minimal change on the CT portion of exam. 2. Focal activity in the anterior apical region of prostate gland is concerning for local recurrence. 3. No metastatic adenopathy or visceral metastasis. Electronically Signed   By: Suzy Bouchard M.D.   On: 01/30/2022 16:56     Assessment & Plan: Prostate cancer metastatic to bone.   His PSA peaked at 382 on 2/2 but is down to 72 after initiation of firmagon.  He is now on apalutamide and is tolerating both well.   He will continue monthly firmagon in this office and see me in 3 months.    Dr. Delton [name] will be obtaining labs.   Back pain.  This could be from the metastatic disease.  We will need to monitor but his PSA is declining so hopefully that will improve.  He is to start [facility] as well.   Nodular prostate with obstruction.  He is voiding ok on current therapy.       Meds ordered this encounter  Medications   degarelix (FIRMAGON) injection 80 mg      Orders Placed This Encounter  Procedures   Urinalysis, Routine w reflex microscopic      Return in about 3 months (around 05/19/2022) for continue monthly firmagon and then see me in 3 months. .   CC: Dettinger, Fransisca Kaufmann, MD and Dr. Derek Jack.      Luisenrique Conran 02/17/2022 Patient ID: [name], male   DOB: Apr 22, 1941, 81 y.o.   MRN: [id number]

## 2022-02-20 ENCOUNTER — Ambulatory Visit (INDEPENDENT_AMBULATORY_CARE_PROVIDER_SITE_OTHER): Payer: No Typology Code available for payment source | Admitting: Family Medicine

## 2022-02-20 ENCOUNTER — Other Ambulatory Visit: Payer: Self-pay | Admitting: Interventional Cardiology

## 2022-02-20 ENCOUNTER — Encounter: Payer: Self-pay | Admitting: Family Medicine

## 2022-02-20 VITALS — BP 152/58 | HR 57 | Ht 64.0 in | Wt 155.0 lb

## 2022-02-20 DIAGNOSIS — D6859 Other primary thrombophilia: Secondary | ICD-10-CM | POA: Diagnosis not present

## 2022-02-20 DIAGNOSIS — E1169 Type 2 diabetes mellitus with other specified complication: Secondary | ICD-10-CM | POA: Diagnosis not present

## 2022-02-20 DIAGNOSIS — E785 Hyperlipidemia, unspecified: Secondary | ICD-10-CM

## 2022-02-20 DIAGNOSIS — I480 Paroxysmal atrial fibrillation: Secondary | ICD-10-CM | POA: Diagnosis not present

## 2022-02-20 DIAGNOSIS — E1159 Type 2 diabetes mellitus with other circulatory complications: Secondary | ICD-10-CM

## 2022-02-20 DIAGNOSIS — I152 Hypertension secondary to endocrine disorders: Secondary | ICD-10-CM | POA: Diagnosis not present

## 2022-02-20 DIAGNOSIS — Z86718 Personal history of other venous thrombosis and embolism: Secondary | ICD-10-CM

## 2022-02-20 DIAGNOSIS — K219 Gastro-esophageal reflux disease without esophagitis: Secondary | ICD-10-CM | POA: Diagnosis not present

## 2022-02-20 DIAGNOSIS — Z7901 Long term (current) use of anticoagulants: Secondary | ICD-10-CM

## 2022-02-20 DIAGNOSIS — I422 Other hypertrophic cardiomyopathy: Secondary | ICD-10-CM

## 2022-02-20 LAB — COAGUCHEK XS/INR WAIVED
INR: 1.5 — ABNORMAL HIGH (ref 0.9–1.1)
Prothrombin Time: 18.4 s

## 2022-02-20 LAB — BAYER DCA HB A1C WAIVED: HB A1C (BAYER DCA - WAIVED): 6.4 % — ABNORMAL HIGH (ref 4.8–5.6)

## 2022-02-20 MED ORDER — LISINOPRIL 20 MG PO TABS: 30.0000 mg | ORAL_TABLET | Freq: Every day | ORAL | 3 refills | Status: DC

## 2022-02-20 MED ORDER — AMLODIPINE BESYLATE 10 MG PO TABS: 10.0000 mg | ORAL_TABLET | Freq: Every evening | ORAL | 3 refills | Status: DC

## 2022-02-20 MED ORDER — METOPROLOL SUCCINATE ER 100 MG PO TB24: 100.0000 mg | ORAL_TABLET | Freq: Every day | ORAL | 3 refills | Status: DC

## 2022-02-20 MED ORDER — OMEPRAZOLE 20 MG PO CPDR: 20.0000 mg | DELAYED_RELEASE_CAPSULE | Freq: Every day | ORAL | 3 refills | Status: DC

## 2022-02-20 NOTE — Progress Notes (Signed)
° °BP (!) 152/58    Pulse (!) 57    Ht 5' 4" (1.626 m)    Wt 155 lb (70.3 kg)    SpO2 100%    BMI 26.61 kg/m²   ° °Subjective:  ° °Patient ID: Alan Wiatrek Jr., male    DOB: 07/29/1941, 81 y.o.   MRN: [id number] ° °HPI: °Alan Santiago Jr. is a 81 y.o. male presenting on 02/20/2022 for Medical Management of Chronic Issues, Atrial Fibrillation, and Diabetes ° ° °HPI °Type 2 diabetes mellitus °Patient comes in today for recheck of his diabetes. Patient has been currently taking metformin. Patient is currently on an ACE inhibitor/ARB. Patient has not seen an ophthalmologist this year. Patient denies any issues with their feet. The symptom started onset as an adult hypertension and hyperlipidemia ARE RELATED TO DM  ° °Hypertension and cardiomyopathy °Patient is currently on amlodipine and chlorthalidone and lisinopril and metoprolol, and their blood pressure today is 152/58. Patient denies any lightheadedness or dizziness. Patient denies headaches, blurred vision, chest pains, shortness of breath, or weakness. Denies any side effects from medication and is content with current medication.  ° °Hyperlipidemia °Patient is coming in for recheck of his hyperlipidemia. The patient is currently taking pravastatin. They deny any issues with myalgias or history of liver damage from it. They deny any focal numbness or weakness or chest pain.  ° °Coumadin recheck °Target goal: 2.0-3.0 °Reason on anticoagulation: History of DVTs and has A-fib °Patient denies any bruising or bleeding or chest pain or palpitations  ° °Relevant past medical, surgical, family and social history reviewed and updated as indicated. Interim medical history since our last visit reviewed. °Allergies and medications reviewed and updated. ° °Review of Systems  °Constitutional:  Negative for chills and fever.  °Eyes:  Negative for visual disturbance.  °Respiratory:  Negative for shortness of breath and wheezing.   °Cardiovascular:  Negative for chest pain and leg  swelling.  °Musculoskeletal:  Negative for back pain and gait problem.  °Skin:  Negative for rash.  °Neurological:  Negative for dizziness, weakness and light-headedness.  °All other systems reviewed and are negative. ° °Per HPI unless specifically indicated above ° ° °Allergies as of 02/20/2022   ° °   Reactions  ° Allopurinol Other (See Comments)  ° Significantly decreased WBC's  ° Colchicine Other (See Comments)  ° Significantly decreased WBC's  ° °  ° °  °Medication List  °  ° °  ° Accurate as of February 20, 2022  2:28 PM. If you have any questions, ask your nurse or doctor.  °  °  ° °  ° °acetaminophen 500 MG tablet °Commonly known as: TYLENOL °Take 1,000 mg by mouth every 6 (six) hours as needed for moderate pain. °  °amLODipine 10 MG tablet °Commonly known as: NORVASC °Take 1 tablet (10 mg total) by mouth every evening. °  °Centrum Silver 50+Men Tabs °Take 1 tablet by mouth daily. °  °chlorthalidone 25 MG tablet °Commonly known as: HYGROTON °Take 1 tablet (25 mg total) by mouth daily. Please make overdue appt with Dr. Smith before anymore refills. Thank you 1st attempt °  °diclofenac Sodium 1 % Gel °Commonly known as: VOLTAREN °Apply 1 application topically 2 (two) times daily as needed (pain). °  °Erleada 60 MG tablet °Generic drug: apalutamide °Take 4 tablets (240 mg total) by mouth daily. Take as directed. °  °FeroSul 325 (65 FE) MG tablet °Generic drug: ferrous sulfate °Take 1 tablet by mouth once daily   with breakfast °  °fluticasone 50 MCG/ACT nasal spray °Commonly known as: FLONASE °Place 1 spray into both nostrils 2 (two) times daily as needed for allergies or rhinitis. °  °glucose blood test strip °Commonly known as: ONE TOUCH ULTRA TEST °Use to check BG once daily.  Dx:  Type 2 DM controlled E11.9 °  °HYDROcodone-acetaminophen 5-325 MG tablet °Commonly known as: NORCO/VICODIN °Take 1 tablet by mouth every 6 (six) hours as needed for moderate pain. Can take every 4 hours if needed. °  °lisinopril 20 MG  tablet °Commonly known as: ZESTRIL °Take 1.5 tablets (30 mg total) by mouth daily. °What changed: See the new instructions. °Changed by:  A , MD °  °metFORMIN 1000 MG tablet °Commonly known as: GLUCOPHAGE °Take 1 tablet (1,000 mg total) by mouth 2 (two) times daily with a meal. °  °metoprolol succinate 100 MG 24 hr tablet °Commonly known as: TOPROL-XL °Take 1 tablet (100 mg total) by mouth daily. Take with or immediately following a meal. °What changed: See the new instructions. °Changed by:  A , MD °  °omeprazole 20 MG capsule °Commonly known as: PRILOSEC °Take 1 capsule (20 mg total) by mouth daily. °  °ONE TOUCH ULTRA MINI w/Device Kit °1 each by Does not apply route 2 (two) times daily. °  °potassium chloride SA 20 MEQ tablet °Commonly known as: KLOR-CON M °TAKE 1  BY MOUTH ONCE DAILY °  °pravastatin 40 MG tablet °Commonly known as: PRAVACHOL °Take 1 tablet (40 mg total) by mouth every evening. °  °warfarin 5 MG tablet °Commonly known as: COUMADIN °Take as directed by the anticoagulation clinic. If you are unsure how to take this medication, talk to your nurse or doctor. °Original instructions: TAKE 1 & 1/2 (ONE & ONE-HALF) TABLETS BY MOUTH ONCE DAILY 6 IN THE EVENING °What changed:  °how much to take °how to take this °when to take this °  ° °  ° ° ° °Objective:  ° °BP (!) 152/58    Pulse (!) 57    Ht 5' 4" (1.626 m)    Wt 155 lb (70.3 kg)    SpO2 100%    BMI 26.61 kg/m²   °Wt Readings from Last 3 Encounters:  °02/20/22 155 lb (70.3 kg)  °02/09/22 159 lb 13.3 oz (72.5 kg)  °02/02/22 159 lb 12.8 oz (72.5 kg)  °  °Physical Exam °Vitals and nursing note reviewed.  °Constitutional:   °   General: He is not in acute distress. °   Appearance: He is well-developed. He is not diaphoretic.  °Eyes:  °   General: No scleral icterus. °   Conjunctiva/sclera: Conjunctivae normal.  °Neck:  °   Thyroid: No thyromegaly.  °Cardiovascular:  °   Rate and Rhythm: Normal rate. Rhythm irregular.  °    Heart sounds: Normal heart sounds. No murmur heard. °Pulmonary:  °   Effort: Pulmonary effort is normal. No respiratory distress.  °   Breath sounds: Normal breath sounds. No wheezing.  °Musculoskeletal:     °   General: No swelling. Normal range of motion.  °   Cervical back: Neck supple.  °Lymphadenopathy:  °   Cervical: No cervical adenopathy.  °Skin: °   General: Skin is warm and dry.  °   Findings: No rash.  °Neurological:  °   Mental Status: He is alert and oriented to person, place, and time.  °   Coordination: Coordination normal.  °Psychiatric:     °     Behavior: Behavior normal.  ° ° ° ° °Assessment & Plan:  ° °Problem List Items Addressed This Visit   ° °  ° Cardiovascular and Mediastinum  ° Hypertension associated with diabetes (HCC)  ° Relevant Medications  ° amLODipine (NORVASC) 10 MG tablet  ° lisinopril (ZESTRIL) 20 MG tablet  ° metoprolol succinate (TOPROL-XL) 100 MG 24 hr tablet  ° Other Relevant Orders  ° CBC with Differential/Platelet  ° Bayer DCA Hb A1c Waived  ° CoaguChek XS/INR Waived  ° Lipid panel  ° Paroxysmal atrial fibrillation (HCC) - Primary  ° Relevant Medications  ° amLODipine (NORVASC) 10 MG tablet  ° lisinopril (ZESTRIL) 20 MG tablet  ° metoprolol succinate (TOPROL-XL) 100 MG 24 hr tablet  ° Other Relevant Orders  ° CoaguChek XS/INR Waived  ° Hypertrophic cardiomyopathy (HCC)  ° Relevant Medications  ° amLODipine (NORVASC) 10 MG tablet  ° lisinopril (ZESTRIL) 20 MG tablet  ° metoprolol succinate (TOPROL-XL) 100 MG 24 hr tablet  °  ° Digestive  ° GERD (gastroesophageal reflux disease)  ° Relevant Medications  ° omeprazole (PRILOSEC) 20 MG capsule  °  ° Endocrine  ° Hyperlipidemia associated with type 2 diabetes mellitus (HCC)  ° Relevant Medications  ° amLODipine (NORVASC) 10 MG tablet  ° lisinopril (ZESTRIL) 20 MG tablet  ° metoprolol succinate (TOPROL-XL) 100 MG 24 hr tablet  °  ° Hematopoietic and Hemostatic  ° Primary hypercoagulable state (HCC) [D68.59]  °  ° Other  ° Long term  (current) use of anticoagulants [Z79.01]  ° History of DVT (deep vein thrombosis)  ° °Other Visit Diagnoses   ° ° Type 2 diabetes mellitus with other specified complication, without long-term current use of insulin (HCC)      ° Relevant Medications  ° lisinopril (ZESTRIL) 20 MG tablet  ° Other Relevant Orders  ° Bayer DCA Hb A1c Waived  ° °  °  °Continue current medicine, A1c looks good at 6.4 and blood pressure is decent, did not want to press it because of low diastolic and concerns for falls.  Allow permissive hypertension. ° °Description   °Started new cancer medicine, seems to affected the dosing.  Increased to take 1 tablet every day of the week except for Mondays, Wednesdays and Saturdays take 1 1/2 tablets ° °INR was 1.5 (goal 2-3)  ° °Follow-up 3-5 weeks °  ° ° °Follow up plan: °Return if symptoms worsen or fail to improve, for 3 to 5-week anticoagulation recheck. ° °Counseling provided for all of the vaccine components °Orders Placed This Encounter  °Procedures  ° CBC with Differential/Platelet  ° Bayer DCA Hb A1c Waived  ° CoaguChek XS/INR Waived  ° Lipid panel  ° ° ° , MD °[facility] °02/20/2022, 2:28 PM ° ° ° ° °

## 2022-02-21 LAB — LIPID PANEL
Chol/HDL Ratio: 3.3 ratio (ref 0.0–5.0)
Cholesterol, Total: 93 mg/dL — ABNORMAL LOW (ref 100–199)
HDL: 28 mg/dL — ABNORMAL LOW (ref >39)
LDL Chol Calc (NIH): 39 mg/dL (ref 0–99)
Triglycerides: 150 mg/dL — ABNORMAL HIGH (ref 0–149)
VLDL Cholesterol Cal: 26 mg/dL (ref 5–40)

## 2022-02-21 LAB — CBC WITH DIFFERENTIAL/PLATELET
Basophils Absolute: 0 10*3/uL (ref 0.0–0.2)
Basos: 0 %
EOS (ABSOLUTE): 0.1 10*3/uL (ref 0.0–0.4)
Eos: 1 %
Hematocrit: 30.6 % — ABNORMAL LOW (ref 37.5–51.0)
Hemoglobin: 9.7 g/dL — ABNORMAL LOW (ref 13.0–17.7)
Immature Grans (Abs): 0 10*3/uL (ref 0.0–0.1)
Immature Granulocytes: 0 %
Lymphocytes Absolute: 1.2 10*3/uL (ref 0.7–3.1)
Lymphs: 26 %
MCH: 28.4 pg (ref 26.6–33.0)
MCHC: 31.7 g/dL (ref 31.5–35.7)
MCV: 90 fL (ref 79–97)
Monocytes Absolute: 0.5 10*3/uL (ref 0.1–0.9)
Monocytes: 12 %
Neutrophils Absolute: 2.7 10*3/uL (ref 1.4–7.0)
Neutrophils: 61 %
Platelets: 287 10*3/uL (ref 150–450)
RBC: 3.42 x10E6/uL — ABNORMAL LOW (ref 4.14–5.80)
RDW: 13 % (ref 11.6–15.4)
WBC: 4.5 10*3/uL (ref 3.4–10.8)

## 2022-02-22 NOTE — Telephone Encounter (Signed)
Oral Oncology Patient Advocate Encounter ? ?Received notification from [facility]) that patient has been successfully enrolled into their program to receive Erleada from the manufacturer at $0 out of pocket until 12/10/22.  ?  ?I called and spoke with patient.  He knows we will have to re-apply.  ? ?Patient knows to call the office with questions or concerns. ?  ?Oral Oncology Clinic will continue to follow. ? ?Dennison Nancy CPHT ?Specialty Pharmacy Patient Advocate ?[place] ?Phone [phone] ?Fax [phone] ?02/22/2022 3:29 PM ?

## 2022-02-23 ENCOUNTER — Other Ambulatory Visit: Payer: No Typology Code available for payment source

## 2022-02-28 ENCOUNTER — Other Ambulatory Visit: Payer: Self-pay

## 2022-02-28 ENCOUNTER — Inpatient Hospital Stay (HOSPITAL_BASED_OUTPATIENT_CLINIC_OR_DEPARTMENT_OTHER): Payer: No Typology Code available for payment source | Admitting: Hematology

## 2022-02-28 ENCOUNTER — Inpatient Hospital Stay (HOSPITAL_COMMUNITY): Payer: No Typology Code available for payment source | Attending: Hematology

## 2022-02-28 DIAGNOSIS — Z823 Family history of stroke: Secondary | ICD-10-CM | POA: Insufficient documentation

## 2022-02-28 DIAGNOSIS — D649 Anemia, unspecified: Secondary | ICD-10-CM

## 2022-02-28 DIAGNOSIS — I10 Essential (primary) hypertension: Secondary | ICD-10-CM | POA: Insufficient documentation

## 2022-02-28 DIAGNOSIS — Z8249 Family history of ischemic heart disease and other diseases of the circulatory system: Secondary | ICD-10-CM | POA: Diagnosis not present

## 2022-02-28 DIAGNOSIS — G479 Sleep disorder, unspecified: Secondary | ICD-10-CM | POA: Diagnosis not present

## 2022-02-28 DIAGNOSIS — Z86718 Personal history of other venous thrombosis and embolism: Secondary | ICD-10-CM | POA: Diagnosis not present

## 2022-02-28 DIAGNOSIS — C61 Malignant neoplasm of prostate: Secondary | ICD-10-CM

## 2022-02-28 DIAGNOSIS — I422 Other hypertrophic cardiomyopathy: Secondary | ICD-10-CM | POA: Insufficient documentation

## 2022-02-28 DIAGNOSIS — C7951 Secondary malignant neoplasm of bone: Secondary | ICD-10-CM | POA: Insufficient documentation

## 2022-02-28 DIAGNOSIS — Z7901 Long term (current) use of anticoagulants: Secondary | ICD-10-CM | POA: Insufficient documentation

## 2022-02-28 DIAGNOSIS — I480 Paroxysmal atrial fibrillation: Secondary | ICD-10-CM | POA: Diagnosis not present

## 2022-02-28 DIAGNOSIS — Z8719 Personal history of other diseases of the digestive system: Secondary | ICD-10-CM | POA: Insufficient documentation

## 2022-02-28 DIAGNOSIS — R197 Diarrhea, unspecified: Secondary | ICD-10-CM | POA: Insufficient documentation

## 2022-02-28 DIAGNOSIS — Z9049 Acquired absence of other specified parts of digestive tract: Secondary | ICD-10-CM | POA: Insufficient documentation

## 2022-02-28 DIAGNOSIS — I700 Atherosclerosis of aorta: Secondary | ICD-10-CM | POA: Insufficient documentation

## 2022-02-28 DIAGNOSIS — M47816 Spondylosis without myelopathy or radiculopathy, lumbar region: Secondary | ICD-10-CM | POA: Diagnosis not present

## 2022-02-28 DIAGNOSIS — E782 Mixed hyperlipidemia: Secondary | ICD-10-CM | POA: Diagnosis not present

## 2022-02-28 DIAGNOSIS — K219 Gastro-esophageal reflux disease without esophagitis: Secondary | ICD-10-CM | POA: Insufficient documentation

## 2022-02-28 DIAGNOSIS — M48061 Spinal stenosis, lumbar region without neurogenic claudication: Secondary | ICD-10-CM | POA: Diagnosis not present

## 2022-02-28 DIAGNOSIS — E119 Type 2 diabetes mellitus without complications: Secondary | ICD-10-CM | POA: Insufficient documentation

## 2022-02-28 DIAGNOSIS — K5900 Constipation, unspecified: Secondary | ICD-10-CM | POA: Insufficient documentation

## 2022-02-28 DIAGNOSIS — M47814 Spondylosis without myelopathy or radiculopathy, thoracic region: Secondary | ICD-10-CM | POA: Diagnosis not present

## 2022-02-28 DIAGNOSIS — Z808 Family history of malignant neoplasm of other organs or systems: Secondary | ICD-10-CM | POA: Insufficient documentation

## 2022-02-28 DIAGNOSIS — Z79899 Other long term (current) drug therapy: Secondary | ICD-10-CM | POA: Insufficient documentation

## 2022-02-28 DIAGNOSIS — R200 Anesthesia of skin: Secondary | ICD-10-CM | POA: Insufficient documentation

## 2022-02-28 DIAGNOSIS — Z803 Family history of malignant neoplasm of breast: Secondary | ICD-10-CM | POA: Insufficient documentation

## 2022-02-28 DIAGNOSIS — K861 Other chronic pancreatitis: Secondary | ICD-10-CM | POA: Insufficient documentation

## 2022-02-28 DIAGNOSIS — Z86010 Personal history of colonic polyps: Secondary | ICD-10-CM | POA: Diagnosis not present

## 2022-02-28 LAB — CBC WITH DIFFERENTIAL/PLATELET
Abs Immature Granulocytes: 0.01 10*3/uL (ref 0.00–0.07)
Basophils Absolute: 0 10*3/uL (ref 0.0–0.1)
Basophils Relative: 0 %
Eosinophils Absolute: 0.1 10*3/uL (ref 0.0–0.5)
Eosinophils Relative: 2 %
HCT: 31.3 % — ABNORMAL LOW (ref 39.0–52.0)
Hemoglobin: 9.7 g/dL — ABNORMAL LOW (ref 13.0–17.0)
Immature Granulocytes: 0 %
Lymphocytes Relative: 27 %
Lymphs Abs: 1.3 10*3/uL (ref 0.7–4.0)
MCH: 29 pg (ref 26.0–34.0)
MCHC: 31 g/dL (ref 30.0–36.0)
MCV: 93.7 fL (ref 80.0–100.0)
Monocytes Absolute: 0.4 10*3/uL (ref 0.1–1.0)
Monocytes Relative: 9 %
Neutro Abs: 3.1 10*3/uL (ref 1.7–7.7)
Neutrophils Relative %: 62 %
Platelets: 262 10*3/uL (ref 150–400)
RBC: 3.34 MIL/uL — ABNORMAL LOW (ref 4.22–5.81)
RDW: 13.7 % (ref 11.5–15.5)
WBC: 5 10*3/uL (ref 4.0–10.5)
nRBC: 0 % (ref 0.0–0.2)

## 2022-02-28 LAB — COMPREHENSIVE METABOLIC PANEL
ALT: 13 U/L (ref 0–44)
AST: 18 U/L (ref 15–41)
Albumin: 3.3 g/dL — ABNORMAL LOW (ref 3.5–5.0)
Alkaline Phosphatase: 334 U/L — ABNORMAL HIGH (ref 38–126)
Anion gap: 13 (ref 5–15)
BUN: 22 mg/dL (ref 8–23)
CO2: 21 mmol/L — ABNORMAL LOW (ref 22–32)
Calcium: 8.9 mg/dL (ref 8.9–10.3)
Chloride: 102 mmol/L (ref 98–111)
Creatinine, Ser: 1.07 mg/dL (ref 0.61–1.24)
GFR, Estimated: >60 mL/min (ref >60)
Glucose, Bld: 131 mg/dL — ABNORMAL HIGH (ref 70–99)
Potassium: 3.6 mmol/L (ref 3.5–5.1)
Sodium: 136 mmol/L (ref 135–145)
Total Bilirubin: 0.2 mg/dL — ABNORMAL LOW (ref 0.3–1.2)
Total Protein: 6.9 g/dL (ref 6.5–8.1)

## 2022-02-28 LAB — MAGNESIUM: Magnesium: 1.8 mg/dL (ref 1.7–2.4)

## 2022-02-28 NOTE — Progress Notes (Signed)
? ?[facility] ?[address] ?[place], [place] [address] ? ? ?CLINIC:  ?Medical Oncology/Hematology ? ?PCP:  ?Dettinger, Fransisca Kaufmann, MD ?[place] / [place] [address] ?[phone] ? ? ?REASON FOR VISIT:  ?Follow-up for prostate cancer ? ?PRIOR THERAPY: none ? ?NGS Results: not done ? ?CURRENT THERAPY: surveillance ? ?BRIEF ONCOLOGIC HISTORY:  ?Oncology History  ?Prostate cancer ([place])  ?04/13/2014 Initial Diagnosis  ? Prostate cancer ([place]) ?  ?06/25/2020 Cancer Staging  ? Staging form: Prostate, AJCC 8th Edition ?- Clinical stage from 06/25/2020: Stage IIC (cT2b, cN0, cM0, PSA: 12.8, Grade Group: 3) - Signed by Freeman Caldron, PA-C on 09/29/2020 ? ?  ? ? ?CANCER STAGING: ? Cancer Staging  ?Prostate cancer ([place]) ?Staging form: Prostate, AJCC 8th Edition ?- Clinical stage from 06/25/2020: Stage IIC (cT2b, cN0, cM0, PSA: 12.8, Grade Group: 3) - Signed by Freeman Caldron, PA-C on 09/29/2020 ?- Pathologic stage from 01/26/2022: Stage IVB (pT2, pN0, pM1b, Grade Group: 3) - Unsigned ? ? ?INTERVAL HISTORY:  ?[name], a 81 y.o. male, returns for routine follow-up of his prostate cancer. Alan [name] was last seen on 02/02/2022.  ? ?Today he reports feeling good. He reports constipation and difficulty sleeping since starting ERLEADA 3 weeks ago; he takes 3 tablets at 9 am. He also reports occasional diarrhea. He reports he received a Firmagon injection on 03/16. He reports he was given clearance from his dentist to start Xgeva.  ? ?REVIEW OF SYSTEMS:  ?Review of Systems  ?Constitutional:  Negative for appetite change and fatigue.  ?Gastrointestinal:  Positive for constipation and diarrhea (occasional).  ?Neurological:  Positive for numbness.  ?Psychiatric/Behavioral:  Positive for sleep disturbance.   ?All other systems reviewed and are negative. ? ?PAST MEDICAL/SURGICAL HISTORY:  ?Past Medical History:  ?Diagnosis Date  ? Anticoagulated on Coumadin   ? managed by pcp  ? Arthritis   ? BPH with urinary obstruction    ? Diverticulosis of colon   ? ED (erectile dysfunction)   ? 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 found during hospital stay with pancreatitis,  started coumadin;   last Abd CT in epic 07-29-2020  chronic thrombus portal venous  ? GERD (gastroesophageal reflux disease)   ? Heart murmur   ? History of acute pancreatitis   ? 2016   necrotizing pancreatitis  ? History of adenomatous polyp of colon   ? History of DVT of lower extremity   ? History of GI bleed 08/2019  ? upper gi bleed due to duodenal ulcer  ? History of gout yrs ago  ? Hypertension   ? followed by pcp  ? Hypertrophic cardiomyopathy ([place])   ? followed by cardiology  ? Internal hemorrhoids   ? Mixed hyperlipidemia   ? Normocytic anemia   ? PAF (paroxysmal atrial fibrillation) ([place]) 04/2016  ? cardiologist-- dr h. Tamala Julian--  event monitor 07-04-2017 epic, NSR/ PAflutter with occasional RVR/  PACs/ PVCs ;  echo 05-16-2017 epic,  moderate LVH with severe asymptomic septal hypertrophy, G1DD, EF 60-65%,  mild AR, mild LAE  ? Pancreatic pseudocyst   ? Peyronie's disease   ? Prostate cancer [facility]) urologist-- dr wrenn/ oncologist-- dr Tammi Klippel  ? first dx 08/ 2009 Gleason 3+3 active survelliance;  until bx 07/ 2021  Stage T2b, Gleason 4+3  ? Right inguinal hernia   ? Type 2 diabetes mellitus ([place])   ? followed by pcp---  (11-03-2020 per pt currently not checking blood sugar due to glucose  monitor broken)  ? Wears glasses   ? Wears partial dentures   ? upper and lower  ? ?Past Surgical History:  ?Procedure Laterality Date  ? BIOPSY  02/09/2022  ? Procedure: BIOPSY;  Surgeon: Milus Banister, MD;  Location: [facility];  Service: Endoscopy;;  ? CHOLECYSTECTOMY N/A 11/26/2015  ? Procedure: LAPAROSCOPIC CHOLECYSTECTOMY;  Surgeon: Aviva Signs, MD;  [facility]: [facility];  Service: General;  Laterality: N/A;  ? COLONOSCOPY  last one 09-06-2020  dr stark  ? CYSTOSCOPY N/A 11/09/2020  ? Procedure: CYSTOSCOPY FLEXIBLE;  Surgeon: Irine Seal, MD;  Location:  [facility];  Service: Urology;  Laterality: N/A;  ? ESOPHAGOGASTRODUODENOSCOPY  10-06-2019  _0   ? ESOPHAGOGASTRODUODENOSCOPY N/A 02/09/2022  ? Procedure: ESOPHAGOGASTRODUODENOSCOPY (EGD);  Surgeon: Milus Banister, MD;  Location: [facility];  Service: Endoscopy;  Laterality: N/A;  ? EUS N/A 02/09/2022  ? Procedure: UPPER ENDOSCOPIC ULTRASOUND (EUS) RADIAL;  Surgeon: Milus Banister, MD;  Location: [facility];  Service: Endoscopy;  Laterality: N/A;  ? FLEXIBLE BRONCHOSCOPY Bilateral 04/14/2016  ? Procedure: FLEXIBLE BRONCHOSCOPY;  Surgeon: Sinda Du, MD;  [facility];  Service: Cardiopulmonary;  Laterality: Bilateral;  ? HEMORRHOID SURGERY  yrs ago  ? RADIOACTIVE SEED IMPLANT N/A 11/09/2020  ? Procedure: RADIOACTIVE SEED IMPLANT/BRACHYTHERAPY IMPLANT;  Surgeon: Irine Seal, MD;  Location: [facility];  Service: Urology;  Laterality: N/A;  ? SPACE OAR INSTILLATION N/A 11/09/2020  ? Procedure: SPACE OAR INSTILLATION;  Surgeon: Irine Seal, MD;  Location: [facility];  Service: Urology;  Laterality: N/A;  ? UMBILICAL HERNIA REPAIR N/A 11/26/2015  ? Procedure: UMBILICAL HERNIORRHAPHY;  Surgeon: Aviva Signs, MD;  [facility]: [facility];  Service: General;  Laterality: N/A;  ? ? ?SOCIAL HISTORY:  ?Social History  ? ?Socioeconomic History  ? Marital status: Married  ?  Spouse name: Alan [name]  ? Number of children: 4  ? Years of education: Not on file  ? Highest education level: 10th grade  ?Occupational History  ? Occupation: Retired   ?  Employer: [employer]  ?Tobacco Use  ? Smoking status: Former  ?  Years: 5.00  ?  Types: Cigarettes  ?  Quit date: 12/11/1986  ?  Years since quitting: 35.2  ? Smokeless tobacco: Never  ?Vaping Use  ? Vaping Use: Never used  ?Substance and Sexual Activity  ? Alcohol use: No  ? Drug use: Never  ? Sexual activity: Yes  ?Other Topics Concern  ? Not on file  ?Social History Narrative  ? Not on file  ? ?Social Determinants of Health   ? ?Financial Resource Strain: Low Risk   ? Difficulty of Paying Living Expenses: Not hard at all  ?Food Insecurity: No Food Insecurity  ? Worried About Charity fundraiser in the Last Year: Never true  ? Ran Out of Food in the Last Year: Never true  ?Transportation Needs: No Transportation Needs  ? Lack of Transportation (Medical): No  ? Lack of Transportation (Non-Medical): No  ?Physical Activity: Inactive  ? Days of Exercise per Week: 0 days  ? Minutes of Exercise per Session: 0 min  ?Stress: Stress Concern Present  ? Feeling of Stress : To some extent  ?Social Connections: Socially Integrated  ? Frequency of Communication with Friends and Family: Three times a week  ? Frequency of Social Gatherings with Friends and Family: Three times a week  ? Attends Religious Services: More than 4 times per year  ? Active Member of Clubs  or Organizations: Yes  ? Attends Archivist Meetings: More than 4 times per year  ? Marital Status: Married  ?Intimate Partner Violence: Not At Risk  ? Fear of Current or Ex-Partner: No  ? Emotionally Abused: No  ? Physically Abused: No  ? Sexually Abused: No  ? ? ?FAMILY HISTORY:  ?Family History  ?Problem Relation Age of Onset  ? Stroke Mother   ? Hypertension Mother   ? Alzheimer's disease Mother   ? Hypertension Father   ? Brain cancer Sister   ? Breast cancer Sister   ? Hypertension Sister   ? Deep vein thrombosis Brother   ? Hypertension Brother   ? Colon cancer Neg Hx   ? Pancreatic disease Neg Hx   ? Esophageal cancer Neg Hx   ? Rectal cancer Neg Hx   ? Stomach cancer Neg Hx   ? ? ?CURRENT MEDICATIONS:  ?Current Outpatient Medications  ?Medication Sig Dispense Refill  ? acetaminophen (TYLENOL) 500 MG tablet Take 1,000 mg by mouth every 6 (six) hours as needed for moderate pain.    ? amLODipine (NORVASC) 10 MG tablet Take 1 tablet (10 mg total) by mouth every evening. 90 tablet 3  ? apalutamide (ERLEADA) 60 MG tablet Take 4 tablets (240 mg total) by mouth daily. Take as  directed. 120 tablet 0  ? Blood Glucose Monitoring Suppl (ONE TOUCH ULTRA MINI) w/Device KIT 1 each by Does not apply route 2 (two) times daily. 1 kit 1  ? chlorthalidone (HYGROTON) 25 MG tablet Take 1 tablet (25 mg tot

## 2022-02-28 NOTE — Patient Instructions (Addendum)
[facility] at [facility] ?Discharge Instructions ? ? ?You were seen and examined today by Dr. Delton Coombes. ? ?He reviewed the results of your lab work which are normal/stable.  ? ?Continue Erleada as prescribed.  You can take this pill in the evening to help lessen the side effects.  ? ?We will start you on a shot for bone health called Xgeva.  We will administer this in the clinic once a month. This pill can cause the calcium in your blood to be low. You should take a calcium + Vitamin D tablet twice a day.  ? ?Return as scheduled.  ? ? ?Thank you for choosing [place] at [facility] to provide your oncology and hematology care.  To afford each patient quality time with our provider, please arrive at least 15 minutes before your scheduled appointment time.  ? ?If you have a lab appointment with the [facility] please come in thru the Main Entrance and check in at the main information desk. ? ?You need to re-schedule your appointment should you arrive 10 or more minutes late.  We strive to give you quality time with our providers, and arriving late affects you and other patients whose appointments are after yours.  Also, if you no show three or more times for appointments you may be dismissed from the clinic at the providers discretion.     ?Again, thank you for choosing [facility].  Our hope is that these requests will decrease the amount of time that you wait before being seen by our physicians.       ?_____________________________________________________________ ? ?Should you have questions after your visit to [facility], please contact our office at [phone] and follow the prompts.  Our office hours are 8:00 a.m. and 4:30 p.m. Monday - Friday.  Please note that voicemails left after 4:00 p.m. may not be returned until the following business day.  We are closed weekends and major holidays.  You do have access to a nurse 24-7, just  call the main number to the clinic [phone] and do not press any options, hold on the line and a nurse will answer the phone.   ? ?For prescription refill requests, have your pharmacy contact our office and allow 72 hours.   ? ?Due to Covid, you will need to wear a mask upon entering the hospital. If you do not have a mask, a mask will be given to you at the Main Entrance upon arrival. For doctor visits, patients may have 1 support person age 40 or older with them. For treatment visits, patients can not have anyone with them due to social distancing guidelines and our immunocompromised population.  ? ?   ?

## 2022-03-02 ENCOUNTER — Ambulatory Visit: Payer: No Typology Code available for payment source | Admitting: Urology

## 2022-03-02 ENCOUNTER — Other Ambulatory Visit (HOSPITAL_COMMUNITY): Payer: Self-pay

## 2022-03-06 DIAGNOSIS — C7951 Secondary malignant neoplasm of bone: Secondary | ICD-10-CM | POA: Insufficient documentation

## 2022-03-12 ENCOUNTER — Other Ambulatory Visit: Payer: Self-pay

## 2022-03-12 ENCOUNTER — Emergency Department (HOSPITAL_COMMUNITY): Payer: No Typology Code available for payment source

## 2022-03-12 ENCOUNTER — Encounter (HOSPITAL_COMMUNITY): Payer: Self-pay | Admitting: Emergency Medicine

## 2022-03-12 ENCOUNTER — Encounter (HOSPITAL_COMMUNITY): Payer: Self-pay | Admitting: Hematology

## 2022-03-12 ENCOUNTER — Inpatient Hospital Stay (HOSPITAL_COMMUNITY)
Admission: EM | Admit: 2022-03-12 | Discharge: 2022-03-14 | DRG: 871 | Disposition: A | Discharge status: Home / Self Care | Payer: No Typology Code available for payment source | Attending: Internal Medicine | Admitting: Internal Medicine

## 2022-03-12 DIAGNOSIS — K219 Gastro-esophageal reflux disease without esophagitis: Secondary | ICD-10-CM | POA: Diagnosis present

## 2022-03-12 DIAGNOSIS — Z888 Allergy status to other drugs, medicaments and biological substances status: Secondary | ICD-10-CM | POA: Diagnosis not present

## 2022-03-12 DIAGNOSIS — Z820 Family history of epilepsy and other diseases of the nervous system: Secondary | ICD-10-CM

## 2022-03-12 DIAGNOSIS — J189 Pneumonia, unspecified organism: Secondary | ICD-10-CM | POA: Diagnosis not present

## 2022-03-12 DIAGNOSIS — E785 Hyperlipidemia, unspecified: Secondary | ICD-10-CM | POA: Diagnosis not present

## 2022-03-12 DIAGNOSIS — C7951 Secondary malignant neoplasm of bone: Secondary | ICD-10-CM | POA: Diagnosis not present

## 2022-03-12 DIAGNOSIS — I152 Hypertension secondary to endocrine disorders: Secondary | ICD-10-CM | POA: Diagnosis not present

## 2022-03-12 DIAGNOSIS — E876 Hypokalemia: Secondary | ICD-10-CM

## 2022-03-12 DIAGNOSIS — Z79899 Other long term (current) drug therapy: Secondary | ICD-10-CM

## 2022-03-12 DIAGNOSIS — D649 Anemia, unspecified: Secondary | ICD-10-CM

## 2022-03-12 DIAGNOSIS — R6520 Severe sepsis without septic shock: Secondary | ICD-10-CM | POA: Diagnosis present

## 2022-03-12 DIAGNOSIS — E782 Mixed hyperlipidemia: Secondary | ICD-10-CM | POA: Diagnosis present

## 2022-03-12 DIAGNOSIS — R0602 Shortness of breath: Secondary | ICD-10-CM | POA: Diagnosis not present

## 2022-03-12 DIAGNOSIS — I480 Paroxysmal atrial fibrillation: Secondary | ICD-10-CM | POA: Diagnosis present

## 2022-03-12 DIAGNOSIS — D630 Anemia in neoplastic disease: Secondary | ICD-10-CM | POA: Diagnosis present

## 2022-03-12 DIAGNOSIS — Z8249 Family history of ischemic heart disease and other diseases of the circulatory system: Secondary | ICD-10-CM

## 2022-03-12 DIAGNOSIS — Z7901 Long term (current) use of anticoagulants: Secondary | ICD-10-CM

## 2022-03-12 DIAGNOSIS — N400 Enlarged prostate without lower urinary tract symptoms: Secondary | ICD-10-CM | POA: Diagnosis not present

## 2022-03-12 DIAGNOSIS — R0689 Other abnormalities of breathing: Secondary | ICD-10-CM | POA: Diagnosis not present

## 2022-03-12 DIAGNOSIS — E119 Type 2 diabetes mellitus without complications: Secondary | ICD-10-CM

## 2022-03-12 DIAGNOSIS — N179 Acute kidney failure, unspecified: Secondary | ICD-10-CM

## 2022-03-12 DIAGNOSIS — I422 Other hypertrophic cardiomyopathy: Secondary | ICD-10-CM | POA: Diagnosis present

## 2022-03-12 DIAGNOSIS — Z7984 Long term (current) use of oral hypoglycemic drugs: Secondary | ICD-10-CM | POA: Diagnosis not present

## 2022-03-12 DIAGNOSIS — Z808 Family history of malignant neoplasm of other organs or systems: Secondary | ICD-10-CM

## 2022-03-12 DIAGNOSIS — E1169 Type 2 diabetes mellitus with other specified complication: Secondary | ICD-10-CM | POA: Diagnosis present

## 2022-03-12 DIAGNOSIS — Z87891 Personal history of nicotine dependence: Secondary | ICD-10-CM

## 2022-03-12 DIAGNOSIS — R000 Tachycardia, unspecified: Secondary | ICD-10-CM | POA: Diagnosis not present

## 2022-03-12 DIAGNOSIS — R778 Other specified abnormalities of plasma proteins: Secondary | ICD-10-CM | POA: Diagnosis not present

## 2022-03-12 DIAGNOSIS — J181 Lobar pneumonia, unspecified organism: Secondary | ICD-10-CM | POA: Diagnosis present

## 2022-03-12 DIAGNOSIS — E860 Dehydration: Secondary | ICD-10-CM | POA: Diagnosis present

## 2022-03-12 DIAGNOSIS — Z20822 Contact with and (suspected) exposure to covid-19: Secondary | ICD-10-CM | POA: Diagnosis not present

## 2022-03-12 DIAGNOSIS — R748 Abnormal levels of other serum enzymes: Secondary | ICD-10-CM

## 2022-03-12 DIAGNOSIS — E872 Acidosis, unspecified: Secondary | ICD-10-CM | POA: Diagnosis not present

## 2022-03-12 DIAGNOSIS — A419 Sepsis, unspecified organism: Secondary | ICD-10-CM | POA: Diagnosis not present

## 2022-03-12 DIAGNOSIS — I10 Essential (primary) hypertension: Secondary | ICD-10-CM | POA: Diagnosis present

## 2022-03-12 DIAGNOSIS — Z79890 Hormone replacement therapy (postmenopausal): Secondary | ICD-10-CM

## 2022-03-12 DIAGNOSIS — Z803 Family history of malignant neoplasm of breast: Secondary | ICD-10-CM

## 2022-03-12 DIAGNOSIS — M109 Gout, unspecified: Secondary | ICD-10-CM | POA: Diagnosis present

## 2022-03-12 DIAGNOSIS — R739 Hyperglycemia, unspecified: Secondary | ICD-10-CM | POA: Diagnosis not present

## 2022-03-12 DIAGNOSIS — Z823 Family history of stroke: Secondary | ICD-10-CM

## 2022-03-12 DIAGNOSIS — R410 Disorientation, unspecified: Secondary | ICD-10-CM | POA: Diagnosis not present

## 2022-03-12 DIAGNOSIS — C61 Malignant neoplasm of prostate: Secondary | ICD-10-CM | POA: Diagnosis not present

## 2022-03-12 DIAGNOSIS — Z86718 Personal history of other venous thrombosis and embolism: Secondary | ICD-10-CM

## 2022-03-12 LAB — BASIC METABOLIC PANEL
Anion gap: 18 — ABNORMAL HIGH (ref 5–15)
BUN: 14 mg/dL (ref 8–23)
CO2: 20 mmol/L — ABNORMAL LOW (ref 22–32)
Calcium: 8.7 mg/dL — ABNORMAL LOW (ref 8.9–10.3)
Chloride: 100 mmol/L (ref 98–111)
Creatinine, Ser: 1.21 mg/dL (ref 0.61–1.24)
GFR, Estimated: >60 mL/min (ref >60)
Glucose, Bld: 163 mg/dL — ABNORMAL HIGH (ref 70–99)
Potassium: 3.3 mmol/L — ABNORMAL LOW (ref 3.5–5.1)
Sodium: 138 mmol/L (ref 135–145)

## 2022-03-12 LAB — GLUCOSE, CAPILLARY
Glucose-Capillary: 129 mg/dL — ABNORMAL HIGH (ref 70–99)
Glucose-Capillary: 142 mg/dL — ABNORMAL HIGH (ref 70–99)

## 2022-03-12 LAB — CBC WITH DIFFERENTIAL/PLATELET
Abs Immature Granulocytes: 0.06 10*3/uL (ref 0.00–0.07)
Basophils Absolute: 0 10*3/uL (ref 0.0–0.1)
Basophils Relative: 0 %
Eosinophils Absolute: 0 10*3/uL (ref 0.0–0.5)
Eosinophils Relative: 0 %
HCT: 26.9 % — ABNORMAL LOW (ref 39.0–52.0)
Hemoglobin: 9 g/dL — ABNORMAL LOW (ref 13.0–17.0)
Immature Granulocytes: 1 %
Lymphocytes Relative: 4 %
Lymphs Abs: 0.3 10*3/uL — ABNORMAL LOW (ref 0.7–4.0)
MCH: 28.9 pg (ref 26.0–34.0)
MCHC: 33.5 g/dL (ref 30.0–36.0)
MCV: 86.5 fL (ref 80.0–100.0)
Monocytes Absolute: 0.8 10*3/uL (ref 0.1–1.0)
Monocytes Relative: 9 %
Neutro Abs: 7.5 10*3/uL (ref 1.7–7.7)
Neutrophils Relative %: 86 %
Platelets: 172 10*3/uL (ref 150–400)
RBC: 3.11 MIL/uL — ABNORMAL LOW (ref 4.22–5.81)
RDW: 14.2 % (ref 11.5–15.5)
WBC: 8.7 10*3/uL (ref 4.0–10.5)
nRBC: 0 % (ref 0.0–0.2)

## 2022-03-12 LAB — CBC
HCT: 24.6 % — ABNORMAL LOW (ref 39.0–52.0)
Hemoglobin: 8 g/dL — ABNORMAL LOW (ref 13.0–17.0)
MCH: 28.7 pg (ref 26.0–34.0)
MCHC: 32.5 g/dL (ref 30.0–36.0)
MCV: 88.2 fL (ref 80.0–100.0)
Platelets: 158 10*3/uL (ref 150–400)
RBC: 2.79 MIL/uL — ABNORMAL LOW (ref 4.22–5.81)
RDW: 14.2 % (ref 11.5–15.5)
WBC: 8.5 10*3/uL (ref 4.0–10.5)
nRBC: 0 % (ref 0.0–0.2)

## 2022-03-12 LAB — COMPREHENSIVE METABOLIC PANEL
ALT: 13 U/L (ref 0–44)
AST: 25 U/L (ref 15–41)
Albumin: 2.9 g/dL — ABNORMAL LOW (ref 3.5–5.0)
Alkaline Phosphatase: 174 U/L — ABNORMAL HIGH (ref 38–126)
Anion gap: 14 (ref 5–15)
BUN: 14 mg/dL (ref 8–23)
CO2: 19 mmol/L — ABNORMAL LOW (ref 22–32)
Calcium: 8.6 mg/dL — ABNORMAL LOW (ref 8.9–10.3)
Chloride: 101 mmol/L (ref 98–111)
Creatinine, Ser: 1.33 mg/dL — ABNORMAL HIGH (ref 0.61–1.24)
GFR, Estimated: 54 mL/min — ABNORMAL LOW (ref >60)
Glucose, Bld: 206 mg/dL — ABNORMAL HIGH (ref 70–99)
Potassium: 3.4 mmol/L — ABNORMAL LOW (ref 3.5–5.1)
Sodium: 134 mmol/L — ABNORMAL LOW (ref 135–145)
Total Bilirubin: 0.4 mg/dL (ref 0.3–1.2)
Total Protein: 6.4 g/dL — ABNORMAL LOW (ref 6.5–8.1)

## 2022-03-12 LAB — URINALYSIS, ROUTINE W REFLEX MICROSCOPIC
Bacteria, UA: NONE SEEN
Bilirubin Urine: NEGATIVE
Glucose, UA: NEGATIVE mg/dL
Ketones, ur: 5 mg/dL — AB
Leukocytes,Ua: NEGATIVE
Nitrite: NEGATIVE
Protein, ur: 30 mg/dL — AB
Specific Gravity, Urine: 1.014 (ref 1.005–1.030)
pH: 6 (ref 5.0–8.0)

## 2022-03-12 LAB — RESP PANEL BY RT-PCR (FLU A&B, COVID) ARPGX2
Influenza A by PCR: NEGATIVE
Influenza B by PCR: NEGATIVE
SARS Coronavirus 2 by RT PCR: NEGATIVE

## 2022-03-12 LAB — LACTIC ACID, PLASMA
Lactic Acid, Venous: 6.4 mmol/L (ref 0.5–1.9)
Lactic Acid, Venous: 4.7 mmol/L (ref 0.5–1.9)
Lactic Acid, Venous: 5.6 mmol/L (ref 0.5–1.9)
Lactic Acid, Venous: 4.3 mmol/L (ref 0.5–1.9)

## 2022-03-12 LAB — PROTIME-INR
INR: 1.4 — ABNORMAL HIGH (ref 0.8–1.2)
Prothrombin Time: 17.1 seconds — ABNORMAL HIGH (ref 11.4–15.2)

## 2022-03-12 LAB — APTT: aPTT: 31 seconds (ref 24–36)

## 2022-03-12 LAB — STREP PNEUMONIAE URINARY ANTIGEN: Strep Pneumo Urinary Antigen: NEGATIVE

## 2022-03-12 LAB — CBG MONITORING, ED
Glucose-Capillary: 144 mg/dL — ABNORMAL HIGH (ref 70–99)
Glucose-Capillary: 161 mg/dL — ABNORMAL HIGH (ref 70–99)

## 2022-03-12 LAB — MRSA NEXT GEN BY PCR, NASAL: MRSA by PCR Next Gen: NOT DETECTED

## 2022-03-12 LAB — MAGNESIUM: Magnesium: 1.2 mg/dL — ABNORMAL LOW (ref 1.7–2.4)

## 2022-03-12 MED ORDER — ACETAMINOPHEN 325 MG PO TABS: 650.0000 mg | ORAL_TABLET | Freq: Four times a day (QID) | ORAL | Status: DC | PRN

## 2022-03-12 MED ORDER — PANTOPRAZOLE SODIUM 40 MG PO TBEC
40.0000 mg | DELAYED_RELEASE_TABLET | Freq: Every day | ORAL | Status: DC
  Administered 2022-03-12 – 2022-03-14 (×3): 40 mg via ORAL
  Filled 2022-03-12 (×3): qty 1

## 2022-03-12 MED ORDER — LACTATED RINGERS IV SOLN
INTRAVENOUS | Status: AC
  Administered 2022-03-12: 150 mL/h via INTRAVENOUS

## 2022-03-12 MED ORDER — HYDROCODONE-ACETAMINOPHEN 5-325 MG PO TABS: 1.0000 | ORAL_TABLET | Freq: Four times a day (QID) | ORAL | Status: DC | PRN

## 2022-03-12 MED ORDER — ACETAMINOPHEN 650 MG RE SUPP: 650.0000 mg | Freq: Four times a day (QID) | RECTAL | Status: DC | PRN

## 2022-03-12 MED ORDER — LACTATED RINGERS IV BOLUS (SEPSIS)
250.0000 mL | Freq: Once | INTRAVENOUS | Status: AC
  Administered 2022-03-12: 250 mL via INTRAVENOUS

## 2022-03-12 MED ORDER — LACTATED RINGERS IV BOLUS (SEPSIS)
1000.0000 mL | Freq: Once | INTRAVENOUS | Status: AC
  Administered 2022-03-12: 1000 mL via INTRAVENOUS

## 2022-03-12 MED ORDER — VANCOMYCIN HCL IN DEXTROSE 1-5 GM/200ML-% IV SOLN
1000.0000 mg | INTRAVENOUS | Status: DC
  Administered 2022-03-13 – 2022-03-14 (×2): 1000 mg via INTRAVENOUS
  Filled 2022-03-12 (×2): qty 200

## 2022-03-12 MED ORDER — VANCOMYCIN HCL 1500 MG/300ML IV SOLN
1500.0000 mg | Freq: Once | INTRAVENOUS | Status: AC
Start: 2022-03-12 — End: 2022-03-12
  Administered 2022-03-12: 1500 mg via INTRAVENOUS
  Filled 2022-03-12: qty 300

## 2022-03-12 MED ORDER — INSULIN ASPART 100 UNIT/ML IJ SOLN: 0.0000 [IU] | Freq: Every day | INTRAMUSCULAR | Status: DC

## 2022-03-12 MED ORDER — INSULIN ASPART 100 UNIT/ML IJ SOLN
0.0000 [IU] | Freq: Three times a day (TID) | INTRAMUSCULAR | Status: DC
  Administered 2022-03-12 (×2): 1 [IU] via SUBCUTANEOUS
  Administered 2022-03-12: 2 [IU] via SUBCUTANEOUS
  Administered 2022-03-13: 1 [IU] via SUBCUTANEOUS
  Administered 2022-03-13: 3 [IU] via SUBCUTANEOUS
  Administered 2022-03-13: 1 [IU] via SUBCUTANEOUS
  Administered 2022-03-14: 3 [IU] via SUBCUTANEOUS
  Administered 2022-03-14: 1 [IU] via SUBCUTANEOUS

## 2022-03-12 MED ORDER — WARFARIN SODIUM 5 MG PO TABS
10.0000 mg | ORAL_TABLET | Freq: Once | ORAL | Status: AC
  Administered 2022-03-12: 10 mg via ORAL
  Filled 2022-03-12: qty 2
  Filled 2022-03-12: qty 1

## 2022-03-12 MED ORDER — POTASSIUM CHLORIDE CRYS ER 20 MEQ PO TBCR
20.0000 meq | EXTENDED_RELEASE_TABLET | Freq: Once | ORAL | Status: AC
  Administered 2022-03-12: 20 meq via ORAL

## 2022-03-12 MED ORDER — SODIUM CHLORIDE 0.9 % IV SOLN
2.0000 g | Freq: Two times a day (BID) | INTRAVENOUS | Status: DC
  Administered 2022-03-12 – 2022-03-14 (×5): 2 g via INTRAVENOUS
  Filled 2022-03-12 (×5): qty 2

## 2022-03-12 MED ORDER — ACETAMINOPHEN 325 MG PO TABS
650.0000 mg | ORAL_TABLET | Freq: Once | ORAL | Status: AC
Start: 2022-03-12 — End: 2022-03-12
  Administered 2022-03-12: 650 mg via ORAL
  Filled 2022-03-12: qty 2

## 2022-03-12 MED ORDER — SODIUM CHLORIDE 0.9 % IV SOLN
2.0000 g | INTRAVENOUS | Status: DC
  Administered 2022-03-12: 2 g via INTRAVENOUS
  Filled 2022-03-12: qty 20

## 2022-03-12 MED ORDER — POTASSIUM CHLORIDE CRYS ER 20 MEQ PO TBCR
40.0000 meq | EXTENDED_RELEASE_TABLET | Freq: Two times a day (BID) | ORAL | Status: AC
  Administered 2022-03-12 (×2): 40 meq via ORAL
  Filled 2022-03-12 (×2): qty 2

## 2022-03-12 MED ORDER — METOPROLOL SUCCINATE ER 100 MG PO TB24
100.0000 mg | ORAL_TABLET | Freq: Every day | ORAL | Status: DC
  Administered 2022-03-12 – 2022-03-14 (×3): 100 mg via ORAL
  Filled 2022-03-12 (×3): qty 1

## 2022-03-12 MED ORDER — PRAVASTATIN SODIUM 40 MG PO TABS
40.0000 mg | ORAL_TABLET | Freq: Every evening | ORAL | Status: DC
  Administered 2022-03-12 – 2022-03-13 (×2): 40 mg via ORAL
  Filled 2022-03-12 (×2): qty 1

## 2022-03-12 MED ORDER — MAGNESIUM SULFATE 4 GM/100ML IV SOLN
4.0000 g | Freq: Once | INTRAVENOUS | Status: AC
  Administered 2022-03-12: 4 g via INTRAVENOUS
  Filled 2022-03-12: qty 100

## 2022-03-12 MED ORDER — SODIUM CHLORIDE 0.9 % IV SOLN
500.0000 mg | INTRAVENOUS | Status: DC
  Administered 2022-03-12: 500 mg via INTRAVENOUS
  Filled 2022-03-12: qty 5

## 2022-03-12 MED ORDER — FERROUS SULFATE 325 (65 FE) MG PO TABS
325.0000 mg | ORAL_TABLET | Freq: Every day | ORAL | Status: DC
  Administered 2022-03-12 – 2022-03-14 (×3): 325 mg via ORAL
  Filled 2022-03-12 (×3): qty 1

## 2022-03-12 MED ORDER — WARFARIN - PHARMACIST DOSING INPATIENT
Freq: Every day | Status: DC
Start: 2022-03-12 — End: 2022-03-14

## 2022-03-12 NOTE — ED Notes (Signed)
Called lab to check on status of respiratory swab ?

## 2022-03-12 NOTE — Progress Notes (Signed)
Patient seen and examined.  He still in the emergency room.  Feels much better since last night after getting IV fluids and antibiotics.  Admitted early morning hours by nighttime hospitalist.  Agreed with assessment and plan with addition to below. ? ?In brief, 81 year old gentleman with history of prostate cancer with bone metastasis currently on oral chemotherapy, paroxysmal A-fib on Coumadin, GERD, gout, hypertension, type 2 diabetes on metformin presented to the ER with sudden onset of fever and shortness of breath.  In the emergency room temperature 104, tachypneic, lactic acid 6.4.  Chest x-ray with possible left lower lobe pneumonia.  3 L IV fluid received and started on IV antibiotics after cultures were drawn. ? ?Assessment plan:  ?Patient is adequately resuscitated, lactic acid is trending down but still elevated to 4.7.  Blood pressures are adequate.  Patient does have an implanted port he is on oral chemotherapy however.  Chest x-ray finding is not very impressive given high temperature and lactic acidosis. ?-Discontinue Rocephin and azithromycin, broaden IV antibiotic coverage with vancomycin and cefepime.  Blood cultures were drawn.  Pending.  Sputum cultures if available, Legionella and streptococcal antigen.  Continue maintenance IV fluid. ?-Aggressively replace electrolytes.  Magnesium is 1.2, replace with 4 g magnesium.  Replace potassium. ?-Blood pressures are improving, will start patient on beta-blockers but continue to hold other antihypertensives. ?-COVID-19 swab was not reported, redrawn.  Check COVID-19 and influenza. ?Hold oral chemotherapy agent given acute severe infection. ? ? ?Total time spent: 35 minutes.  No charge visit.  Same-day admission. ?

## 2022-03-12 NOTE — Assessment & Plan Note (Signed)
-  Hold home antihypertensives at this time to avoid hypotension given concern for severe sepsis. ?

## 2022-03-12 NOTE — Assessment & Plan Note (Signed)
Continue pravastatin 

## 2022-03-12 NOTE — Progress Notes (Signed)
Pharmacy Antibiotic Note ? ?Alan [name]. is a 81 y.o. male admitted on 03/12/2022 presenting with fever and SOB, concern for sepsis.  Pharmacy has been consulted for vancomycin and cefepime dosing. ? ?Plan: ?Vancomycin 1500 mg IV x 1, then 1000 mg IV q 24h (eAUC 470, SCr 400-550, SCr 1.21) ?Add MRSA PCR ?Cefepime 2g IV q 12h ?Monitor renal function, Cx/PCR to narrow ?Vancomycin levels as indicated ? ?Height: '5\' 5"'$  (165.1 cm) ?Weight: 73.5 kg (162 lb) ?IBW/kg (Calculated) : 61.5 ? ?Temp (24hrs), Avg:100.4 ?F (38 ?C), Min:98.4 ?F (36.9 ?C), Max:104.1 ?F (40.1 ?C) ? ?Recent Labs  ?Lab 03/12/22 ?0215 03/12/22 ?5456 03/12/22 ?2563 03/12/22 ?8937  ?WBC 8.7 8.5  --   --   ?CREATININE 1.33* 1.21  --   --   ?LATICACIDVEN 6.4*  --  4.7* 5.6*  ?  ?Estimated Creatinine Clearance: 43.1 mL/min (by C-G formula based on SCr of 1.21 mg/dL).   ? ?Allergies  ?Allergen Reactions  ? Allopurinol Other (See Comments)  ?  Significantly decreased WBC's  ? Colchicine Other (See Comments)  ?  Significantly decreased WBC's  ? ? ?Bertis Ruddy, PharmD ?Clinical Pharmacist ?ED Pharmacist Phone # [phone] ?03/12/2022 11:00 AM ? ? ?

## 2022-03-12 NOTE — Assessment & Plan Note (Addendum)
Mild. ?-Replace potassium.  Check magnesium level and replace if low.  Continue to monitor electrolytes. ?

## 2022-03-12 NOTE — ED Triage Notes (Signed)
Pt arrive by EMS from home for c/o SOB. CA pt on chemo treatment running a fever today. 700 mL NS given by EMS pta to ED. Pt is AO x 4 NAD noticed. ?

## 2022-03-12 NOTE — Assessment & Plan Note (Signed)
Likely prerenal from dehydration/severe sepsis. BUN 14, creatinine 1.3 (baseline 1.0).   ?-IV fluid hydration.  Monitor renal function and avoid nephrotoxic agents.  Hold home chlorthalidone and lisinopril. ?

## 2022-03-12 NOTE — H&P (Signed)
?History and Physical  ? ? ?Alan [name]. QMG:[id number] DOB: 1941-08-09 DOA: 03/12/2022 ? ?PCP: Dettinger, Alan Kaufmann, MD ? ?Patient coming from: Home ? ?Chief Complaint: Fever, shortness of breath ? ?HPI: Alan [name]. is a 81 y.o. male with medical history significant of prostate cancer with bone mets, paroxysmal A-fib on Coumadin, DVT, BPH, GERD, pancreatitis, gout, hypertension, hypertrophic cardiomyopathy, hyperlipidemia, non-insulin-dependent type 2 diabetes presenting to the ED for evaluation of fever and shortness of breath.  Temperature 104.1 ?F on arrival to the ED, tachypneic with respiratory rate in the 20s.  Labs showing no leukocytosis.  Hemoglobin 9.0, no significant change compared to recent labs.  Sodium 134.  Potassium 3.4.  Bicarb 19, anion gap 14.  Blood glucose 206.  BUN 14, creatinine 1.3 (baseline 1.0).  Alkaline phosphatase 174, improved compared to recent labs.  Remainder of LFTs normal.  Lactic acid 6.4.  Blood cultures drawn.  UA without signs of infection.  INR 1.4.  Chest x-ray showing patchy left lower lobe atelectasis or infiltrate. Patient was given Tylenol, ceftriaxone, azithromycin, and 30 cc.kg fluid boluses per sepsis protocol. ? ?History provided by patient and his family at bedside.  Family states patient started having chills last night and was too weak to get up and walk.  He has been coughing for the past 4 to 5 days.  Cough productive of clear sputum.  Patient also reports slight shortness of breath.  States he had some mild substernal chest pain when he came into the emergency room and it has resolved.  Reports mild intermittent diarrhea for the past 1 month since he was started on Erleada for his prostate cancer and was told that diarrhea is one of the side effects.  Denies nausea, vomiting, or abdominal pain.  He is vaccinated against COVID, received 1 booster shot. ? ?Review of Systems:  ?Review of Systems  ?All other systems reviewed and are negative. ? ?Past Medical  History:  ?Diagnosis Date  ? Anticoagulated on Coumadin   ? managed by pcp  ? Arthritis   ? BPH with urinary obstruction   ? Diverticulosis of colon   ? ED (erectile dysfunction)   ? 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 found during hospital stay with pancreatitis,  started coumadin;   last Abd CT in epic 07-29-2020  chronic thrombus portal venous  ? GERD (gastroesophageal reflux disease)   ? Heart murmur   ? History of acute pancreatitis   ? 2016   necrotizing pancreatitis  ? History of adenomatous polyp of colon   ? History of DVT of lower extremity   ? History of GI bleed 08/2019  ? upper gi bleed due to duodenal ulcer  ? History of gout yrs ago  ? Hypertension   ? followed by pcp  ? Hypertrophic cardiomyopathy ([place])   ? followed by cardiology  ? Internal hemorrhoids   ? Mixed hyperlipidemia   ? Normocytic anemia   ? PAF (paroxysmal atrial fibrillation) ([place]) 04/2016  ? cardiologist-- dr h. Tamala Julian--  event monitor 07-04-2017 epic, NSR/ PAflutter with occasional RVR/  PACs/ PVCs ;  echo 05-16-2017 epic,  moderate LVH with severe asymptomic septal hypertrophy, G1DD, EF 60-65%,  mild AR, mild LAE  ? Pancreatic pseudocyst   ? Peyronie's disease   ? Prostate cancer [facility]) urologist-- dr wrenn/ oncologist-- dr Tammi Klippel  ? first dx 08/ 2009 Gleason 3+3 active survelliance;  until bx 07/ 2021  Stage T2b, Gleason 4+3  ? Right inguinal hernia   ?  Type 2 diabetes mellitus ([place])   ? followed by pcp---  (11-03-2020 per pt currently not checking blood sugar due to glucose monitor broken)  ? Wears glasses   ? Wears partial dentures   ? upper and lower  ? ? ?Past Surgical History:  ?Procedure Laterality Date  ? BIOPSY  02/09/2022  ? Procedure: BIOPSY;  Surgeon: Milus Banister, MD;  Location: [facility];  Service: Endoscopy;;  ? CHOLECYSTECTOMY N/A 11/26/2015  ? Procedure: LAPAROSCOPIC CHOLECYSTECTOMY;  Surgeon: Aviva Signs, MD;  Location: [facility];  Service: General;  Laterality: N/A;  ? COLONOSCOPY  last one  09-06-2020  dr stark  ? CYSTOSCOPY N/A 11/09/2020  ? Procedure: CYSTOSCOPY FLEXIBLE;  Surgeon: Irine Seal, MD;  Location: [facility];  Service: Urology;  Laterality: N/A;  ? ESOPHAGOGASTRODUODENOSCOPY  10-06-2019  '@[facility]'   ? ESOPHAGOGASTRODUODENOSCOPY N/A 02/09/2022  ? Procedure: ESOPHAGOGASTRODUODENOSCOPY (EGD);  Surgeon: Milus Banister, MD;  Location: [facility];  Service: Endoscopy;  Laterality: N/A;  ? EUS N/A 02/09/2022  ? Procedure: UPPER ENDOSCOPIC ULTRASOUND (EUS) RADIAL;  Surgeon: Milus Banister, MD;  [facility]: [facility];  Service: Endoscopy;  Laterality: N/A;  ? FLEXIBLE BRONCHOSCOPY Bilateral 04/14/2016  ? Procedure: FLEXIBLE BRONCHOSCOPY;  Surgeon: Sinda Du, MD;  [facility];  Service: Cardiopulmonary;  Laterality: Bilateral;  ? HEMORRHOID SURGERY  yrs ago  ? RADIOACTIVE SEED IMPLANT N/A 11/09/2020  ? Procedure: RADIOACTIVE SEED IMPLANT/BRACHYTHERAPY IMPLANT;  Surgeon: Irine Seal, MD;  Location: [facility];  Service: Urology;  Laterality: N/A;  ? SPACE OAR INSTILLATION N/A 11/09/2020  ? Procedure: SPACE OAR INSTILLATION;  Surgeon: Irine Seal, MD;  Location: [facility];  Service: Urology;  Laterality: N/A;  ? UMBILICAL HERNIA REPAIR N/A 11/26/2015  ? Procedure: UMBILICAL HERNIORRHAPHY;  Surgeon: Aviva Signs, MD;  Location: [facility];  Service: General;  Laterality: N/A;  ? ? ? reports that he quit smoking about 35 years ago. His smoking use included cigarettes. He has never used smokeless tobacco. He reports that he does not drink alcohol and does not use drugs. ? ?Allergies  ?Allergen Reactions  ? Allopurinol Other (See Comments)  ?  Significantly decreased WBC's  ? Colchicine Other (See Comments)  ?  Significantly decreased WBC's  ? ? ?Family History  ?Problem Relation Age of Onset  ? Stroke Mother   ? Hypertension Mother   ? Alzheimer's disease Mother   ? Hypertension Father   ? Brain cancer Sister   ? Breast cancer Sister   ?  Hypertension Sister   ? Deep vein thrombosis Brother   ? Hypertension Brother   ? Colon cancer Neg Hx   ? Pancreatic disease Neg Hx   ? Esophageal cancer Neg Hx   ? Rectal cancer Neg Hx   ? Stomach cancer Neg Hx   ? ? ?Prior to Admission medications   ?Medication Sig Start Date End Date Taking? Authorizing Provider  ?acetaminophen (TYLENOL) 500 MG tablet Take 1,000 mg by mouth every 6 (six) hours as needed for moderate pain.   Yes [provider]  ?amLODipine (NORVASC) 10 MG tablet Take 1 tablet (10 mg total) by mouth every evening. 02/20/22  Yes Dettinger, Alan Kaufmann, MD  ?apalutamide (ERLEADA) 60 MG tablet Take 4 tablets (240 mg total) by mouth daily. Take as directed. ?Patient taking differently: Take 180 mg by mouth daily. Take as directed. 01/30/22  Yes Derek Jack, MD  ?Blood Glucose Monitoring Suppl (ONE TOUCH ULTRA MINI) w/Device KIT 1 each by  Does not apply route 2 (two) times daily. 01/14/21  Yes Dettinger, Alan Kaufmann, MD  ?Calcium Carb-Cholecalciferol (CALCIUM 600 + D PO) Take 1 tablet by mouth in the morning and at bedtime.   Yes [provider]  ?chlorthalidone (HYGROTON) 25 MG tablet Take 1 tablet (25 mg total) by mouth daily. 02/20/22  Yes Belva Crome, MD  ?diclofenac Sodium (VOLTAREN) 1 % GEL Apply 1 application topically 2 (two) times daily as needed (pain).   Yes [provider]  ?FEROSUL 325 (65 Fe) MG tablet Take 1 tablet by mouth once daily with breakfast ?Patient taking differently: Take 325 mg by mouth daily with breakfast. 01/17/22  Yes Dettinger, Alan Kaufmann, MD  ?fluticasone (FLONASE) 50 MCG/ACT nasal spray Place 1 spray into both nostrils 2 (two) times daily as needed for allergies or rhinitis. 11/05/15  Yes Dettinger, Alan Kaufmann, MD  ?glucose blood (ONE TOUCH ULTRA TEST) test strip Use to check BG once daily.  Dx:  Type 2 DM controlled E11.9 11/30/16  Yes Dettinger, Alan Kaufmann, MD  ?HYDROcodone-acetaminophen (NORCO/VICODIN) 5-325 MG tablet Take 1 tablet by mouth every  6 (six) hours as needed for moderate pain. Can take every 4 hours if needed. 02/03/22  Yes Pennington, Rebekah M, PA-C  ?lisinopril (ZESTRIL) 20 MG tablet Take 1.5 tablets (30 mg total) by mouth daily. 02/20/22  Yes Det

## 2022-03-12 NOTE — Assessment & Plan Note (Signed)
Likely due to mild intermittent diarrhea. ?-IV fluid hydration, continue to monitor ?

## 2022-03-12 NOTE — Assessment & Plan Note (Signed)
Currently in sinus rhythm.  INR subtherapeutic (1.4). ?-Coumadin dosing per pharmacy.  Hold metoprolol at this time given concern for severe sepsis. ?

## 2022-03-12 NOTE — Assessment & Plan Note (Signed)
Chronic and stable, possibly related to cancer treatment.  Not endorsing any symptoms consistent with acute blood loss. ?-Continue home iron supplement and monitor H&H ?

## 2022-03-12 NOTE — Assessment & Plan Note (Signed)
Blood glucose 206.  A1c 6.4 on 02/20/2022. ?-Order sensitive sliding scale insulin ACHS.  Hold metformin. ?

## 2022-03-12 NOTE — Assessment & Plan Note (Signed)
INR subtherapeutic. ?-Coumadin dosing per pharmacy ?

## 2022-03-12 NOTE — ED Provider Notes (Signed)
?[facility] ?Provider Note ? ? ?CSN: [id number] ?Arrival date & time: 03/12/22  0157 ? ?  ? ?History ? ?Chief Complaint  ?Patient presents with  ? Shortness of Breath  ? ? ?Alan [name]. is a 81 y.o. male. ? ?The history is provided by the patient and the EMS personnel.  ?He has history of hypertension, hyperlipidemia, atrial fibrillation anticoagulated on warfarin, hypertrophic cardiomyopathy, prostate cancer metastatic to bone and was brought in by ambulance because he had a shaking episode at home and was complaining of some shortness of breath.  Family reported that he was incontinent with that shaking episode.  EMS noted elevated temperature.  Patient is without complaints currently.  He denies pain anywhere and denies any difficulty breathing.  He denies any nausea. ?  ?Home Medications ?Prior to Admission medications   ?Medication Sig Start Date End Date Taking? Authorizing Provider  ?acetaminophen (TYLENOL) 500 MG tablet Take 1,000 mg by mouth every 6 (six) hours as needed for moderate pain.    [provider]  ?amLODipine (NORVASC) 10 MG tablet Take 1 tablet (10 mg total) by mouth every evening. 02/20/22   Dettinger, Fransisca Kaufmann, MD  ?apalutamide (ERLEADA) 60 MG tablet Take 4 tablets (240 mg total) by mouth daily. Take as directed. 01/30/22   Derek Jack, MD  ?Blood Glucose Monitoring Suppl (ONE TOUCH ULTRA MINI) w/Device KIT 1 each by Does not apply route 2 (two) times daily. 01/14/21   Dettinger, Fransisca Kaufmann, MD  ?chlorthalidone (HYGROTON) 25 MG tablet Take 1 tablet (25 mg total) by mouth daily. 02/20/22   Belva Crome, MD  ?diclofenac Sodium (VOLTAREN) 1 % GEL Apply 1 application topically 2 (two) times daily as needed (pain).    [provider]  ?FEROSUL 325 (65 Fe) MG tablet Take 1 tablet by mouth once daily with breakfast 01/17/22   Dettinger, Fransisca Kaufmann, MD  ?fluticasone (FLONASE) 50 MCG/ACT nasal spray Place 1 spray into both nostrils 2 (two) times  daily as needed for allergies or rhinitis. 11/05/15   Dettinger, Fransisca Kaufmann, MD  ?glucose blood (ONE TOUCH ULTRA TEST) test strip Use to check BG once daily.  Dx:  Type 2 DM controlled E11.9 11/30/16   Dettinger, Fransisca Kaufmann, MD  ?HYDROcodone-acetaminophen (NORCO/VICODIN) 5-325 MG tablet Take 1 tablet by mouth every 6 (six) hours as needed for moderate pain. Can take every 4 hours if needed. 02/03/22   Harriett Rush, PA-C  ?lisinopril (ZESTRIL) 20 MG tablet Take 1.5 tablets (30 mg total) by mouth daily. 02/20/22   Dettinger, Fransisca Kaufmann, MD  ?metFORMIN (GLUCOPHAGE) 1000 MG tablet Take 1 tablet (1,000 mg total) by mouth 2 (two) times daily with a meal. 09/21/21   Dettinger, Fransisca Kaufmann, MD  ?metoprolol succinate (TOPROL-XL) 100 MG 24 hr tablet Take 1 tablet (100 mg total) by mouth daily. Take with or immediately following a meal. 02/20/22   Dettinger, Fransisca Kaufmann, MD  ?omeprazole (PRILOSEC) 20 MG capsule Take 1 capsule (20 mg total) by mouth daily. 02/20/22   Dettinger, Fransisca Kaufmann, MD  ?potassium chloride SA (KLOR-CON M) 20 MEQ tablet TAKE 1  BY MOUTH ONCE DAILY 02/01/22   Dettinger, Fransisca Kaufmann, MD  ?pravastatin (PRAVACHOL) 40 MG tablet Take 1 tablet (40 mg total) by mouth every evening. 09/21/21   Dettinger, Fransisca Kaufmann, MD  ?warfarin (COUMADIN) 5 MG tablet TAKE 1 & 1/2 (ONE & ONE-HALF) TABLETS BY MOUTH ONCE DAILY 6 IN THE EVENING ?Patient taking differently: Take 7.5 mg  by mouth daily at 4 PM. TAKE 1 & 1/2 (ONE & ONE-HALF) TABLETS BY MOUTH ONCE DAILY 6 IN THE EVENING 09/21/21   Dettinger, Fransisca Kaufmann, MD  ?   ? ?Allergies    ?Allopurinol and Colchicine   ? ?Review of Systems   ?Review of Systems  ?All other systems reviewed and are negative. ? ?Physical Exam ?Updated Vital Signs ?BP (!) 145/66   Pulse 88   Temp 98.4 ?F (36.9 ?C) (Oral)   Resp (!) 26   Ht _0  (1.651 m)   Wt 73.5 kg   SpO2 97%   BMI 26.96 kg/m?  ?Physical Exam ?Vitals and nursing note reviewed.  ?81 year old male, resting comfortably and in no acute distress.  Vital signs are significant for elevated respiratory rate and mildly elevated blood pressure. Oxygen saturation is 97%, which is normal. ?Head is normocephalic and atraumatic. PERRLA, EOMI. Oropharynx is clear. ?Neck is nontender and supple without adenopathy or JVD. ?Back is nontender and there is no CVA tenderness. ?Lungs are clear without rales, wheezes, or rhonchi. ?Chest is nontender. ?Heart has regular rate and rhythm without murmur. ?Abdomen is soft, flat, nontender. ?Genitalia: Uncircumcised penis.  Massive right inguinal hernia which is soft and nontender. ?Extremities have no cyanosis or edema, full range of motion is present. ?Skin is warm and dry without rash. ?Neurologic: Mental status is normal, cranial nerves are intact, moves all extremities equally. ? ?ED Results / Procedures / Treatments   ?Labs ?(all labs ordered are listed, but only abnormal results are displayed) ?Labs Reviewed  ?LACTIC ACID, PLASMA - Abnormal; Notable for the following components:  ?    Result Value  ? Lactic Acid, Venous 6.4 (*)   ? All other components within normal limits  ?LACTIC ACID, PLASMA - Abnormal; Notable for the following components:  ? Lactic Acid, Venous 4.7 (*)   ? All other components within normal limits  ?COMPREHENSIVE METABOLIC PANEL - Abnormal; Notable for the following components:  ? Sodium 134 (*)   ? Potassium 3.4 (*)   ? CO2 19 (*)   ? Glucose, Bld 206 (*)   ? Creatinine, Ser 1.33 (*)   ? Calcium 8.6 (*)   ? Total Protein 6.4 (*)   ? Albumin 2.9 (*)   ? Alkaline Phosphatase 174 (*)   ? GFR, Estimated 54 (*)   ? All other components within normal limits  ?CBC WITH DIFFERENTIAL/PLATELET - Abnormal; Notable for the following components:  ? RBC 3.11 (*)   ? Hemoglobin 9.0 (*)   ? HCT 26.9 (*)   ? Lymphs Abs 0.3 (*)   ? All other components within normal limits  ?URINALYSIS, ROUTINE W REFLEX MICROSCOPIC - Abnormal; Notable for the following components:  ? Hgb urine dipstick SMALL (*)   ? Ketones, ur 5 (*)   ?  Protein, ur 30 (*)   ? All other components within normal limits  ?PROTIME-INR - Abnormal; Notable for the following components:  ? Prothrombin Time 17.1 (*)   ? INR 1.4 (*)   ? All other components within normal limits  ?CBC - Abnormal; Notable for the following components:  ? RBC 2.79 (*)   ? Hemoglobin 8.0 (*)   ? HCT 24.6 (*)   ? All other components within normal limits  ?BASIC METABOLIC PANEL - Abnormal; Notable for the following components:  ? Potassium 3.3 (*)   ? CO2 20 (*)   ? Glucose, Bld 163 (*)   ? Calcium 8.7 (*)   ?  Anion gap 18 (*)   ? All other components within normal limits  ?MAGNESIUM - Abnormal; Notable for the following components:  ? Magnesium 1.2 (*)   ? All other components within normal limits  ?CBG MONITORING, ED - Abnormal; Notable for the following components:  ? Glucose-Capillary 144 (*)   ? All other components within normal limits  ?CULTURE, BLOOD (ROUTINE X 2)  ?CULTURE, BLOOD (ROUTINE X 2)  ?URINE CULTURE  ?RESP PANEL BY RT-PCR (FLU A&B, COVID) ARPGX2  ?EXPECTORATED SPUTUM ASSESSMENT W GRAM STAIN, RFLX TO RESP C  ?APTT  ?STREP PNEUMONIAE URINARY ANTIGEN  ?LEGIONELLA PNEUMOPHILA SEROGP 1 UR AG  ?LACTIC ACID, PLASMA  ?LACTIC ACID, PLASMA  ? ? ?EKG ?ED ECG REPORT ? ? Date: 03/12/2022 ? Rate: 92 ? Rhythm: normal sinus rhythm ? QRS Axis: normal ? Intervals: normal ? ST/T Wave abnormalities:  Repolarization abnormality of left ventricular hypertrophy ? Conduction Disutrbances:none ? Narrative Interpretation: Compared with ECG of 10/13/2020, no significant changes are seen. ? Old EKG Reviewed: unchanged ? ?DALESSANDRO, BALDYGA [id number] 12-Mar-2022 04:24:54 [facility]-NLD ROUTINE RECORD ?[phone] (89 yr) ?Male Black ?Room:[facility] ?Loc:0 ?Technician: [phone] ?Test ind: ?Vent. rate 93 BPM ?PR interval 189 ms ?QRS duration 91 ms ?QT/QTcB 386/481 ms ?P-R-T axes 45 66 239 ?Sinus rhythm ?LVH with secondary repolarization abnormality ?Borderline prolonged QT interval ?When compared with  ECG of EARLIER SAME DATE ?No significant change was found ?Confirmed by Delora Fuel ([id number]) on 03/12/2022 5:18:09 AM ?Confirmed By: Delora Fuel ?I have personally reviewed the EKG tracing and agree with the c

## 2022-03-12 NOTE — ED Notes (Signed)
Respiratory swab recollected ?

## 2022-03-12 NOTE — ED Notes (Signed)
Date and time results received:  ? ?Test: lactic ?Critical Value: 6.4 ? ?Name of Provider Notified: Roxanne Mins ? ? ?

## 2022-03-12 NOTE — Assessment & Plan Note (Addendum)
Severe sepsis ?Meets SIRS criteria (fever, tachypnea).  Chest x-ray showing patchy left lower lobe atelectasis versus infiltrate.  Meets criteria for severe sepsis with lactate 6.4.  Not tachycardic or hypotensive.  Not hypoxic.  He is vaccinated against COVID and received 1 booster. ?-30cc/kg fluid boluses administered, continue IV fluid hydration and trend lactate.  Continue ceftriaxone and azithromycin.  Tylenol as needed for fevers.  Blood cultures pending.  Sputum Gram stain and culture.  Strep pneumo and Legionella urinary antigens.  Trend WBC count.  COVID and influenza PCR. ?

## 2022-03-12 NOTE — Progress Notes (Signed)
?   03/12/22 1733  ?Assess: MEWS Score  ?BP (!) 150/65  ?Pulse Rate 76  ?Assess: MEWS Score  ?MEWS Temp 0  ?MEWS Systolic 0  ?MEWS Pulse 0  ?MEWS RR 2  ?MEWS LOC 0  ?MEWS Score 2  ?MEWS Score Color Yellow  ?Assess: if the MEWS score is Yellow or Red  ?Were vital signs taken at a resting state? Yes  ?Focused Assessment No change from prior assessment  ?Early Detection of Sepsis Score *See Row Information* Low  ?MEWS guidelines implemented *See Row Information* No, previously red, continue vital signs every 4 hours  ?Treat  ?MEWS Interventions Administered scheduled meds/treatments  ?Pain Scale 0-10  ?Pain Score 0  ?Take Vital Signs  ?Increase Vital Sign Frequency  Yellow: Q 2hr X 2 then Q 4hr X 2, if remains yellow, continue Q 4hrs  ?Escalate  ?MEWS: Escalate Yellow: discuss with charge nurse/RN and consider discussing with provider and RRT  ?Notify: Charge Nurse/RN  ?Name of Charge Nurse/RN Notified Yoko, RN  ?Date Charge Nurse/RN Notified 03/12/22  ?Time Charge Nurse/RN Notified 1733  ? ? ?

## 2022-03-12 NOTE — Assessment & Plan Note (Addendum)
Metastatic to bone.  Currently on Erleada and reports mild intermittent diarrhea since he was started on this medication.  ?-Outpatient oncology follow-up ?

## 2022-03-12 NOTE — Sepsis Progress Note (Signed)
Elink following code sepsis °

## 2022-03-12 NOTE — Progress Notes (Signed)
ANTICOAGULATION CONSULT NOTE - Initial Consult ? ?Pharmacy Consult for warfarin ?Indication: atrial fibrillation ? ?Allergies  ?Allergen Reactions  ? Allopurinol Other (See Comments)  ?  Significantly decreased WBC's  ? Colchicine Other (See Comments)  ?  Significantly decreased WBC's  ? ? ?Patient Measurements: ?Height: '5\' 5"'$  (165.1 cm) ?Weight: 73.5 kg (162 lb) ?IBW/kg (Calculated) : 61.5 ?Heparin Dosing Weight: 73.5 kg ? ?Vital Signs: ?Temp: 99.5 ?F (37.5 ?C) (04/02 9937) ?Temp Source: Oral (04/02 1696) ?BP: 161/60 (04/02 0500) ?Pulse Rate: 96 (04/02 0500) ? ?Labs: ?Recent Labs  ?  03/12/22 ?0215 03/12/22 ?7893  ?HGB 9.0*  --   ?HCT 26.9*  --   ?PLT 172  --   ?APTT  --  31  ?LABPROT  --  17.1*  ?INR  --  1.4*  ?CREATININE 1.33*  --   ? ? ?Estimated Creatinine Clearance: 39.2 mL/min (A) (by C-G formula based on SCr of 1.33 mg/dL (H)). ? ? ?Medical History: ?Past Medical History:  ?Diagnosis Date  ? Anticoagulated on Coumadin   ? managed by pcp  ? Arthritis   ? BPH with urinary obstruction   ? Diverticulosis of colon   ? ED (erectile dysfunction)   ? 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 found during hospital stay with pancreatitis,  started coumadin;   last Abd CT in epic 07-29-2020  chronic thrombus portal venous  ? GERD (gastroesophageal reflux disease)   ? Heart murmur   ? History of acute pancreatitis   ? 2016   necrotizing pancreatitis  ? History of adenomatous polyp of colon   ? History of DVT of lower extremity   ? History of GI bleed 08/2019  ? upper gi bleed due to duodenal ulcer  ? History of gout yrs ago  ? Hypertension   ? followed by pcp  ? Hypertrophic cardiomyopathy ([place])   ? followed by cardiology  ? Internal hemorrhoids   ? Mixed hyperlipidemia   ? Normocytic anemia   ? PAF (paroxysmal atrial fibrillation) ([place]) 04/2016  ? cardiologist-- dr h. Tamala Julian--  event monitor 07-04-2017 epic, NSR/ PAflutter with occasional RVR/  PACs/ PVCs ;  echo 05-16-2017 epic,  moderate LVH with severe  asymptomic septal hypertrophy, G1DD, EF 60-65%,  mild AR, mild LAE  ? Pancreatic pseudocyst   ? Peyronie's disease   ? Prostate cancer [facility]) urologist-- dr wrenn/ oncologist-- dr Tammi Klippel  ? first dx 08/ 2009 Gleason 3+3 active survelliance;  until bx 07/ 2021  Stage T2b, Gleason 4+3  ? Right inguinal hernia   ? Type 2 diabetes mellitus ([place])   ? followed by pcp---  (11-03-2020 per pt currently not checking blood sugar due to glucose monitor broken)  ? Wears glasses   ? Wears partial dentures   ? upper and lower  ? ? ? ?Assessment: 81 y.o. male with medical history significant of prostate cancer with bone mets, paroxysmal A-fib on Coumadin, DVT, BPH, GERD, pancreatitis, gout, hypertension, hypertrophic cardiomyopathy, hyperlipidemia, non-insulin-dependent type 2 diabetes presenting to the ED for evaluation of fever and shortness of breath.  Prior to admission last warfarin dose 03/10/22.  ? ?Warfarin regimen reported: 7.'5mg'$  M,W,F and '5mg'$  T,Th, Sat, and Sun  ? ?INR subtherapeutic: 1.4 - will give higher dose today ? ? ?Goal of Therapy:  ?INR 2-3 ?Monitor platelets by anticoagulation protocol: Yes ?  ?Plan:  ?Warfarin '10mg'$  PO x1 today ?Daily INR and CBC ?Monitor for s/sx of bleeding ? ?Georga Bora, PharmD ?Clinical Pharmacist ?03/12/2022 5:24  AM ?Please check AMION for all [facility] numbers  ? ? ?

## 2022-03-12 NOTE — Assessment & Plan Note (Signed)
Continue PPI ?

## 2022-03-13 DIAGNOSIS — N179 Acute kidney failure, unspecified: Secondary | ICD-10-CM | POA: Diagnosis not present

## 2022-03-13 DIAGNOSIS — A419 Sepsis, unspecified organism: Secondary | ICD-10-CM | POA: Diagnosis not present

## 2022-03-13 DIAGNOSIS — I480 Paroxysmal atrial fibrillation: Secondary | ICD-10-CM

## 2022-03-13 DIAGNOSIS — R6520 Severe sepsis without septic shock: Secondary | ICD-10-CM | POA: Diagnosis not present

## 2022-03-13 DIAGNOSIS — J189 Pneumonia, unspecified organism: Secondary | ICD-10-CM | POA: Diagnosis not present

## 2022-03-13 LAB — CBC
HCT: 28.2 % — ABNORMAL LOW (ref 39.0–52.0)
Hemoglobin: 9.2 g/dL — ABNORMAL LOW (ref 13.0–17.0)
MCH: 28.4 pg (ref 26.0–34.0)
MCHC: 32.6 g/dL (ref 30.0–36.0)
MCV: 87 fL (ref 80.0–100.0)
Platelets: 160 10*3/uL (ref 150–400)
RBC: 3.24 MIL/uL — ABNORMAL LOW (ref 4.22–5.81)
RDW: 14.6 % (ref 11.5–15.5)
WBC: 9 10*3/uL (ref 4.0–10.5)
nRBC: 0 % (ref 0.0–0.2)

## 2022-03-13 LAB — URINE CULTURE

## 2022-03-13 LAB — PROTIME-INR
INR: 1.5 — ABNORMAL HIGH (ref 0.8–1.2)
Prothrombin Time: 18.2 seconds — ABNORMAL HIGH (ref 11.4–15.2)

## 2022-03-13 LAB — GLUCOSE, CAPILLARY
Glucose-Capillary: 142 mg/dL — ABNORMAL HIGH (ref 70–99)
Glucose-Capillary: 201 mg/dL — ABNORMAL HIGH (ref 70–99)
Glucose-Capillary: 142 mg/dL — ABNORMAL HIGH (ref 70–99)
Glucose-Capillary: 147 mg/dL — ABNORMAL HIGH (ref 70–99)

## 2022-03-13 MED ORDER — APALUTAMIDE 60 MG PO TABS: 180.0000 mg | ORAL_TABLET | Freq: Every day | ORAL | Status: DC

## 2022-03-13 MED ORDER — WARFARIN SODIUM 5 MG PO TABS
10.0000 mg | ORAL_TABLET | Freq: Once | ORAL | Status: AC
  Administered 2022-03-13: 10 mg via ORAL
  Filled 2022-03-13: qty 2

## 2022-03-13 NOTE — Progress Notes (Signed)
?PROGRESS NOTE ? ? ? ?[name]  UXN:[id number] DOB: 29-Sep-1941 DOA: 03/12/2022 ?PCP: Dettinger, Fransisca Kaufmann, MD  ? ? ?Brief Narrative:  ?81 year old gentleman with history of prostate cancer with bone metastasis currently on oral chemotherapy, paroxysmal A-fib on Coumadin, GERD, gout, hypertension, type 2 diabetes on metformin presented to the ER with sudden onset of fever and shortness of breath.  In the emergency room temperature 104, tachypneic, lactic acid 6.4.  Chest x-ray with possible left lower lobe pneumonia.  3 L IV fluid received and started on IV antibiotics after cultures were drawn. ? ?Assessment & Plan: ? ?Severe sepsis present on admission, with temperature 104, tachypnea, lactic acid 6.4 and suspected left lower lobe pneumonia. ?Resuscitated with fluid.  Hemodynamically improving.  Blood cultures negative.  Urine culture with multiple flora.  Will repeat urine culture.  Strep pneumonia negative.  Legionella pending.  Clinically improving. ?Treated with vancomycin and cefepime given history of metastatic cancer on chemotherapy, implanted port present.  We will continue antibiotics today until clinical improvement or until culture data available. ?Start mobilizing.  Chest physiotherapy.  Incentive spirometry. ? ?Acute kidney injury: Treated with IV fluids, normalized.  We will recheck tomorrow morning. ? ?History of DVT: On Coumadin.  Subtherapeutic.  Pharmacy managing. ? ?Proximal A-fib: Sinus rhythm.  On metoprolol.  Therapeutic on Coumadin. ? ?Type 2 diabetes on metformin at home, well controlled: A1c 6.4: Well-controlled.  On sliding scale insulin while in the hospital. ? ?Essential hypertension: Risk of hypotension.  Continuing on beta-blockers but holding other antihypertensives. ? ?Hypokalemia/hypomagnesemia: Replaced aggressively.  We will recheck tomorrow morning. ? ?Metastatic prostate cancer: Patient on oral chemotherapy with Erleada.  Holding today.  Will discuss with his oncologist about  continuing medications. ? ?Discharge normalizing.  He can come off telemetry. ? ? ?DVT prophylaxis:   Coumadin ? ? ?Code Status: Full code ?Family Communication: Attempted to call wife and son, unable.  Will update. ?Disposition Plan: Status is: Inpatient ?Remains inpatient appropriate because: Treated for sepsis. ?  ? ? ?Consultants:  ?None ? ?Procedures:  ?None ? ?Antimicrobials:  ?Vancomycin Zosyn 4/2--- ? ? ?Subjective: ?Patient seen and examined.  Denies any complaints.  Poor historian.  No overnight events.  Afebrile since admission. ? ?Objective: ?Vitals:  ? 03/13/22 0034 03/13/22 2542 03/13/22 7062 03/13/22 3762  ?BP: (!) 156/84 135/68 (!) 157/75 (!) 165/77  ?Pulse: (!) 59 98 66 69  ?Resp:  16 18   ?Temp: 98.7 ?F (37.1 ?C) 98.7 ?F (37.1 ?C) 99.3 ?F (37.4 ?C)   ?TempSrc: Oral Oral Oral   ?SpO2: 97% 99%  99%  ?Weight:      ?Height:      ? ? ?Intake/Output Summary (Last 24 hours) at 03/13/2022 1255 ?Last data filed at 03/13/2022 1029 ?Gross per 24 hour  ?Intake 2800.41 ml  ?Output 2400 ml  ?Net 400.41 ml  ? ?Filed Weights  ? 03/12/22 0213  ?Weight: 73.5 kg  ? ? ?Examination: ? ?General exam: Appears calm and comfortable  ?Fairly comfortable.  On room air. ?Respiratory system: Clear to auscultation. Respiratory effort normal.  No added sounds. ?Cardiovascular system: S1 & S2 heard, RRR.  Patient has an implanted port right chest wall.  Nontender. ?Gastrointestinal system: Abdomen is nondistended, soft and nontender. No organomegaly or masses felt. Normal bowel sounds heard. ?Central nervous system: Alert and oriented. No focal neurological deficits. ?Extremities: Symmetric 5 x 5 power. ?Skin: No rashes, lesions or ulcers ?Psychiatry: Judgement and insight appear normal. Mood & affect appropriate.  ? ? ? ?  Data Reviewed: I have personally reviewed following labs and imaging studies ? ?CBC: ?Recent Labs  ?Lab 03/12/22 ?0215 03/12/22 ?8416 03/13/22 ?0138  ?WBC 8.7 8.5 9.0  ?NEUTROABS 7.5  --   --   ?HGB 9.0* 8.0* 9.2*   ?HCT 26.9* 24.6* 28.2*  ?MCV 86.5 88.2 87.0  ?PLT 172 158 160  ? ?Basic Metabolic Panel: ?Recent Labs  ?Lab 03/12/22 ?0215 03/12/22 ?6063  ?NA 134* 138  ?K 3.4* 3.3*  ?CL 101 100  ?CO2 19* 20*  ?GLUCOSE 206* 163*  ?BUN 14 14  ?CREATININE 1.33* 1.21  ?CALCIUM 8.6* 8.7*  ?MG  --  1.2*  ? ?GFR: ?Estimated Creatinine Clearance: 43.1 mL/min (by C-G formula based on SCr of 1.21 mg/dL). ?Liver Function Tests: ?Recent Labs  ?Lab 03/12/22 ?0215  ?AST 25  ?ALT 13  ?ALKPHOS 174*  ?BILITOT 0.4  ?PROT 6.4*  ?ALBUMIN 2.9*  ? ?No results for input(s): LIPASE, AMYLASE in the last 168 hours. ?No results for input(s): AMMONIA in the last 168 hours. ?Coagulation Profile: ?Recent Labs  ?Lab 03/12/22 ?0245 03/13/22 ?0138  ?INR 1.4* 1.5*  ? ?Cardiac Enzymes: ?No results for input(s): CKTOTAL, CKMB, CKMBINDEX, TROPONINI in the last 168 hours. ?BNP (last 3 results) ?No results for input(s): PROBNP in the last 8760 hours. ?HbA1C: ?No results for input(s): HGBA1C in the last 72 hours. ?CBG: ?Recent Labs  ?Lab 03/12/22 ?1129 03/12/22 ?1706 03/12/22 ?2111 03/13/22 ?0160 03/13/22 ?1133  ?GLUCAP 161* [place] ?Lipid Profile: ?No results for input(s): CHOL, HDL, LDLCALC, TRIG, CHOLHDL, LDLDIRECT in the last 72 hours. ?Thyroid Function Tests: ?No results for input(s): TSH, T4TOTAL, FREET4, T3FREE, THYROIDAB in the last 72 hours. ?Anemia Panel: ?No results for input(s): VITAMINB12, FOLATE, FERRITIN, TIBC, IRON, RETICCTPCT in the last 72 hours. ?Sepsis Labs: ?Recent Labs  ?Lab 03/12/22 ?0215 03/12/22 ?1093 03/12/22 ?2355 03/12/22 ?1135  ?LATICACIDVEN 6.4* 4.7* 5.6* 4.3*  ? ? ?Recent Results (from the past 240 hour(s))  ?Blood Culture (routine x 2)     Status: None (Preliminary result)  ? Collection Time: 03/12/22  2:15 AM  ? Specimen: BLOOD RIGHT FOREARM  ?Result Value Ref Range Status  ? Specimen Description BLOOD RIGHT FOREARM  Final  ? Special Requests   Final  ?  BOTTLES DRAWN AEROBIC AND ANAEROBIC Blood Culture adequate volume  ?  Culture   Final  ?  NO GROWTH 1 DAY ?Performed at [facility], [place] [address] [place], [place] [address] ?  ? Report Status PENDING  Incomplete  ?Blood Culture (routine x 2)     Status: None (Preliminary result)  ? Collection Time: 03/12/22  2:18 AM  ? Specimen: BLOOD LEFT FOREARM  ?Result Value Ref Range Status  ? Specimen Description BLOOD LEFT FOREARM  Final  ? Special Requests   Final  ?  BOTTLES DRAWN AEROBIC AND ANAEROBIC Blood Culture results may not be optimal due to an excessive volume of blood received in culture bottles  ? Culture   Final  ?  NO GROWTH 1 DAY ?Performed at [facility], [place] [address] [place], [place] [address] ?  ? Report Status PENDING  Incomplete  ?Urine Culture     Status: Abnormal  ? Collection Time: 03/12/22  2:35 AM  ? Specimen: In/Out Cath Urine  ?Result Value Ref Range Status  ? Specimen Description IN/OUT CATH URINE  Final  ? Special Requests   Final  ?  NONE ?Performed at [facility], [address] [place], [place] [address] ?  ? Culture MULTIPLE SPECIES PRESENT, SUGGEST RECOLLECTION (A)  Final  ? Report Status 03/13/2022 FINAL  Final  ?Resp Panel by RT-PCR (Flu A&B, Covid) Nasopharyngeal Swab     Status: None  ? Collection Time: 03/12/22  9:46 AM  ? Specimen: Nasopharyngeal Swab; Nasopharyngeal(NP) swabs in vial transport medium  ?Result Value Ref Range Status  ? SARS Coronavirus 2 by RT PCR NEGATIVE NEGATIVE Final  ?  Comment: (NOTE) ?SARS-CoV-2 target nucleic acids are NOT DETECTED. ? ?The SARS-CoV-2 RNA is generally detectable in upper respiratory ?specimens during the acute phase of infection. The lowest ?concentration of SARS-CoV-2 viral copies this assay can detect is ?138 copies/mL. A negative result does not preclude SARS-Cov-2 ?infection and should not be used as the sole basis for treatment or ?other patient management decisions. A negative result may occur with  ?improper specimen collection/handling, submission of specimen other ?than  nasopharyngeal swab, presence of viral mutation(s) within the ?areas targeted by this assay, and inadequate number of viral ?copies(<138 copies/mL). A negative result must be combined with ?clinical observat

## 2022-03-13 NOTE — Progress Notes (Signed)
ANTICOAGULATION CONSULT NOTE - Initial Consult ? ?Pharmacy Consult for warfarin ?Indication: atrial fibrillation ? ?Allergies  ?Allergen Reactions  ? Allopurinol Other (See Comments)  ?  Significantly decreased WBC's  ? Colchicine Other (See Comments)  ?  Significantly decreased WBC's  ? ? ?Patient Measurements: ?Height: '5\' 5"'$  (165.1 cm) ?Weight: 73.5 kg (162 lb) ?IBW/kg (Calculated) : 61.5 ?Heparin Dosing Weight: 73.5 kg ? ?Vital Signs: ?Temp: 99.3 ?F (37.4 ?C) (04/03 1610) ?Temp Source: Oral (04/03 9604) ?BP: 165/77 (04/03 5409) ?Pulse Rate: 69 (04/03 8119) ? ?Labs: ?Recent Labs  ?  03/12/22 ?0215 03/12/22 ?0245 03/12/22 ?0633 03/13/22 ?0138  ?HGB 9.0*  --  8.0* 9.2*  ?HCT 26.9*  --  24.6* 28.2*  ?PLT 172  --  158 160  ?APTT  --  31  --   --   ?LABPROT  --  17.1*  --  18.2*  ?INR  --  1.4*  --  1.5*  ?CREATININE 1.33*  --  1.21  --   ? ? ? ?Estimated Creatinine Clearance: 43.1 mL/min (by C-G formula based on SCr of 1.21 mg/dL). ? ? ?Medical History: ?Past Medical History:  ?Diagnosis Date  ? Anticoagulated on Coumadin   ? managed by pcp  ? Arthritis   ? BPH with urinary obstruction   ? Diverticulosis of colon   ? ED (erectile dysfunction)   ? 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 found during hospital stay with pancreatitis,  started coumadin;   last Abd CT in epic 07-29-2020  chronic thrombus portal venous  ? GERD (gastroesophageal reflux disease)   ? Heart murmur   ? History of acute pancreatitis   ? 2016   necrotizing pancreatitis  ? History of adenomatous polyp of colon   ? History of DVT of lower extremity   ? History of GI bleed 08/2019  ? upper gi bleed due to duodenal ulcer  ? History of gout yrs ago  ? Hypertension   ? followed by pcp  ? Hypertrophic cardiomyopathy ([place])   ? followed by cardiology  ? Internal hemorrhoids   ? Mixed hyperlipidemia   ? Normocytic anemia   ? PAF (paroxysmal atrial fibrillation) ([place]) 04/2016  ? cardiologist-- dr h. Tamala Julian--  event monitor 07-04-2017 epic, NSR/  PAflutter with occasional RVR/  PACs/ PVCs ;  echo 05-16-2017 epic,  moderate LVH with severe asymptomic septal hypertrophy, G1DD, EF 60-65%,  mild AR, mild LAE  ? Pancreatic pseudocyst   ? Peyronie's disease   ? Prostate cancer [facility]) urologist-- dr wrenn/ oncologist-- dr Tammi Klippel  ? first dx 08/ 2009 Gleason 3+3 active survelliance;  until bx 07/ 2021  Stage T2b, Gleason 4+3  ? Right inguinal hernia   ? Type 2 diabetes mellitus ([place])   ? followed by pcp---  (11-03-2020 per pt currently not checking blood sugar due to glucose monitor broken)  ? Wears glasses   ? Wears partial dentures   ? upper and lower  ? ? ? ?Assessment: 81 y.o. male with medical history significant of prostate cancer with bone mets, paroxysmal A-fib on Coumadin, DVT, BPH, GERD, pancreatitis, gout, hypertension, hypertrophic cardiomyopathy, hyperlipidemia, non-insulin-dependent type 2 diabetes presenting to the ED for evaluation of fever and shortness of breath.  Prior to admission last warfarin dose 03/10/22.  ? ?Warfarin regimen reported: 7.'5mg'$  M,W,F and '5mg'$  T,Th, Sat, and Sun  ? ?INR subtherapeutic at 1.5 after '10mg'$  x1. However, he missed doses on 4/1 and 4/2. H/H, plt stable.  ? ?Goal of  Therapy:  ?INR 2-3 ?Monitor platelets by anticoagulation protocol: Yes ?  ?Plan:  ?Warfarin '10mg'$  PO x1 today ?Daily INR and CBC ?Monitor for s/sx of bleeding ? ?Benetta Spar, PharmD, BCPS, BCCP ?Clinical Pharmacist ? ?Please check AMION for all [place] phone numbers ?After 10:00 PM, call [place] [phone] ? ?

## 2022-03-13 NOTE — Progress Notes (Signed)
Mobility Specialist Progress Note  ? ? 03/13/22 1450  ?Mobility  ?Activity Ambulated independently in hallway  ?Level of Assistance Contact guard assist, steadying assist  ?Assistive Device  ?(HHA)  ?Distance Ambulated (ft) 480 ft  ?Activity Response Tolerated well  ?$Mobility charge 1 Mobility  ? ?Pt received coming into hallway and agreeable. No complaints on walk. Returned to sitting EOB with call bell in reach.   ? ?Alan [name] ?Mobility Specialist  ?  ?

## 2022-03-14 ENCOUNTER — Ambulatory Visit (HOSPITAL_COMMUNITY): Payer: No Typology Code available for payment source

## 2022-03-14 ENCOUNTER — Inpatient Hospital Stay (HOSPITAL_COMMUNITY): Payer: No Typology Code available for payment source

## 2022-03-14 DIAGNOSIS — J189 Pneumonia, unspecified organism: Secondary | ICD-10-CM | POA: Diagnosis not present

## 2022-03-14 DIAGNOSIS — A419 Sepsis, unspecified organism: Secondary | ICD-10-CM | POA: Diagnosis not present

## 2022-03-14 DIAGNOSIS — N179 Acute kidney failure, unspecified: Secondary | ICD-10-CM | POA: Diagnosis not present

## 2022-03-14 DIAGNOSIS — I480 Paroxysmal atrial fibrillation: Secondary | ICD-10-CM | POA: Diagnosis not present

## 2022-03-14 DIAGNOSIS — C7951 Secondary malignant neoplasm of bone: Secondary | ICD-10-CM

## 2022-03-14 DIAGNOSIS — R6520 Severe sepsis without septic shock: Secondary | ICD-10-CM

## 2022-03-14 LAB — BASIC METABOLIC PANEL
Anion gap: 9 (ref 5–15)
BUN: 14 mg/dL (ref 8–23)
CO2: 23 mmol/L (ref 22–32)
Calcium: 8.7 mg/dL — ABNORMAL LOW (ref 8.9–10.3)
Chloride: 105 mmol/L (ref 98–111)
Creatinine, Ser: 1.04 mg/dL (ref 0.61–1.24)
GFR, Estimated: >60 mL/min (ref >60)
Glucose, Bld: 175 mg/dL — ABNORMAL HIGH (ref 70–99)
Potassium: 3.6 mmol/L (ref 3.5–5.1)
Sodium: 137 mmol/L (ref 135–145)

## 2022-03-14 LAB — PHOSPHORUS: Phosphorus: 3.1 mg/dL (ref 2.5–4.6)

## 2022-03-14 LAB — GLUCOSE, CAPILLARY
Glucose-Capillary: 149 mg/dL — ABNORMAL HIGH (ref 70–99)
Glucose-Capillary: 208 mg/dL — ABNORMAL HIGH (ref 70–99)
Glucose-Capillary: 214 mg/dL — ABNORMAL HIGH (ref 70–99)

## 2022-03-14 LAB — PROTIME-INR
INR: 1.6 — ABNORMAL HIGH (ref 0.8–1.2)
Prothrombin Time: 19.4 seconds — ABNORMAL HIGH (ref 11.4–15.2)

## 2022-03-14 LAB — CBC WITH DIFFERENTIAL/PLATELET
Abs Immature Granulocytes: 0.02 10*3/uL (ref 0.00–0.07)
Basophils Absolute: 0 10*3/uL (ref 0.0–0.1)
Basophils Relative: 0 %
Eosinophils Absolute: 0.1 10*3/uL (ref 0.0–0.5)
Eosinophils Relative: 1 %
HCT: 27.1 % — ABNORMAL LOW (ref 39.0–52.0)
Hemoglobin: 8.9 g/dL — ABNORMAL LOW (ref 13.0–17.0)
Immature Granulocytes: 0 %
Lymphocytes Relative: 10 %
Lymphs Abs: 0.6 10*3/uL — ABNORMAL LOW (ref 0.7–4.0)
MCH: 28.6 pg (ref 26.0–34.0)
MCHC: 32.8 g/dL (ref 30.0–36.0)
MCV: 87.1 fL (ref 80.0–100.0)
Monocytes Absolute: 0.5 10*3/uL (ref 0.1–1.0)
Monocytes Relative: 8 %
Neutro Abs: 5.1 10*3/uL (ref 1.7–7.7)
Neutrophils Relative %: 81 %
Platelets: 172 10*3/uL (ref 150–400)
RBC: 3.11 MIL/uL — ABNORMAL LOW (ref 4.22–5.81)
RDW: 14.5 % (ref 11.5–15.5)
WBC: 6.3 10*3/uL (ref 4.0–10.5)
nRBC: 0 % (ref 0.0–0.2)

## 2022-03-14 LAB — URINE CULTURE: Culture: NO GROWTH

## 2022-03-14 LAB — LEGIONELLA PNEUMOPHILA SEROGP 1 UR AG: L. pneumophila Serogp 1 Ur Ag: NEGATIVE

## 2022-03-14 LAB — MAGNESIUM: Magnesium: 1.5 mg/dL — ABNORMAL LOW (ref 1.7–2.4)

## 2022-03-14 LAB — LACTIC ACID, PLASMA: Lactic Acid, Venous: 1.7 mmol/L (ref 0.5–1.9)

## 2022-03-14 MED ORDER — WARFARIN SODIUM 7.5 MG PO TABS: 12.5000 mg | ORAL_TABLET | Freq: Once | ORAL | Status: DC

## 2022-03-14 MED ORDER — LEVOFLOXACIN 750 MG PO TABS: 750.0000 mg | ORAL_TABLET | Freq: Every day | ORAL | 0 refills | Status: AC

## 2022-03-14 MED ORDER — MAGNESIUM SULFATE 2 GM/50ML IV SOLN
2.0000 g | Freq: Once | INTRAVENOUS | Status: AC
  Administered 2022-03-14: 2 g via INTRAVENOUS
  Filled 2022-03-14: qty 50

## 2022-03-14 NOTE — Progress Notes (Addendum)
Patient given discharge instructions and stated understanding. ?Patient is still receiving his Vancomycin and will be leaving once the medication is complete. ?

## 2022-03-14 NOTE — Discharge Summary (Signed)
Physician Discharge Summary  ?Alan [name]. NWG:[id number] DOB: 1940-12-22 DOA: 03/12/2022 ? ?PCP: Dettinger, Fransisca Kaufmann, MD ? ?Admit date: 03/12/2022 ?Discharge date: 03/14/2022 ? ?Admitted From: Home ?Disposition: Home ? ?Recommendations for Outpatient Follow-up:  ?Follow up with PCP in 1-2 weeks ?Please obtain BMP/CBC in one week ? ? ?Home Health: N/A ?Equipment/Devices: N/A ? ?Discharge Condition: Stable ?CODE STATUS: Full code ?Diet recommendation: Low-salt diet ? ?Discharge summary: ?81 year old gentleman with history of prostate cancer with bone metastasis currently on oral androgen blocker and monthly immunotherapy injection, paroxysmal A-fib on Coumadin, GERD, gout, hypertension, type 2 diabetes on metformin presented to the ER with sudden onset of fever and shortness of breath.  In the emergency room temperature 104, tachypneic, lactic acid 6.4.  Chest x-ray with possible left lower lobe pneumonia.  3 L IV fluid received and started on IV antibiotics after cultures were drawn. ?  ?Severe sepsis present on admission, with temperature 104, tachypnea, lactic acid 6.4 and suspected left lower lobe pneumonia. ?- Resuscitated with fluid.  Dramatically improved after initial fluid resuscitation.  Afebrile for more than 48 hours.  Blood cultures negative.  Urine culture with multiple flora.  Repeat urine cultures pending.  ? Strep pneumonia negative. Treated with vancomycin and cefepime for 48 hours.   ?With dramatic clinical improvement and no remaining evidence of infection, will continue 5 more days of Levaquin.   ?  ?Acute kidney injury: Treated with IV fluids, normalized.  Patient will resume lisinopril and hydrochlorothiazide. ?  ?History of DVT: On Coumadin.  Subtherapeutic.  Resume home dose of Coumadin. ?  ?Proximal A-fib: Sinus rhythm.  On metoprolol.  Therapeutic on Coumadin. ?  ?Type 2 diabetes on metformin at home, well controlled: A1c 6.4: Well-controlled.  Resume metformin on discharge. ? ?Essential  hypertension: Blood pressures adequately stabilized.  Resume home medications. ?  ?Hypokalemia/hypomagnesemia: Replaced aggressively.  Magnesium infusion before discharge today. ?  ?Metastatic prostate cancer: Patient on oral androgen blocker with Erleada.  Discussed with oncology and recommended to continue.  ? ?Adequately stabilized and asymptomatic today.  Able to go home. ? ? ?Discharge Diagnoses:  ?Principal Problem: ?  CAP (community acquired pneumonia) ?Active Problems: ?  Hyperlipidemia associated with type 2 diabetes mellitus ([place]) ?  Prostate cancer ([place]) ?  Hypokalemia ?  GERD (gastroesophageal reflux disease) ?  Hypertension associated with diabetes ([place]) ?  Non-insulin dependent type 2 diabetes mellitus ([place]) ?  Paroxysmal atrial fibrillation ([facility]) ?  History of DVT (deep vein thrombosis) ?  AKI (acute kidney injury) ([place]) ?  Normocytic anemia ?  Normal anion gap metabolic acidosis ? ? ? ?Discharge Instructions ? ?Discharge Instructions   ? ? Call MD for:  difficulty breathing, headache or visual disturbances   Complete by: As directed ?  ? Call MD for:  temperature >100.4   Complete by: As directed ?  ? Diet - low sodium heart healthy   Complete by: As directed ?  ? Increase activity slowly   Complete by: As directed ?  ? ?  ? ?Allergies as of 03/14/2022   ? ?   Reactions  ? Allopurinol Other (See Comments)  ? Significantly decreased WBC's  ? Colchicine Other (See Comments)  ? Significantly decreased WBC's  ? ?  ? ?  ?Medication List  ?  ? ?TAKE these medications   ? ?acetaminophen 500 MG tablet ?Commonly known as: TYLENOL ?Take 1,000 mg by mouth every 6 (six) hours as needed for moderate pain. ?  ?amLODipine 10 MG tablet ?  Commonly known as: NORVASC ?Take 1 tablet (10 mg total) by mouth every evening. ?  ?CALCIUM 600 + D PO ?Take 1 tablet by mouth in the morning and at bedtime. ?  ?chlorthalidone 25 MG tablet ?Commonly known as: HYGROTON ?Take 1 tablet (25 mg total) by mouth daily. ?  ?diclofenac Sodium  1 % Gel ?Commonly known as: VOLTAREN ?Apply 1 application topically 2 (two) times daily as needed (pain). ?  ?Erleada 60 MG tablet ?Generic drug: apalutamide ?Take 4 tablets (240 mg total) by mouth daily. Take as directed. ?What changed: how much to take ?  ?FeroSul 325 (65 FE) MG tablet ?Generic drug: ferrous sulfate ?Take 1 tablet by mouth once daily with breakfast ?What changed: how much to take ?  ?fluticasone 50 MCG/ACT nasal spray ?Commonly known as: FLONASE ?Place 1 spray into both nostrils 2 (two) times daily as needed for allergies or rhinitis. ?  ?glucose blood test strip ?Commonly known as: ONE TOUCH ULTRA TEST ?Use to check BG once daily.  Dx:  Type 2 DM controlled E11.9 ?  ?HYDROcodone-acetaminophen 5-325 MG tablet ?Commonly known as: NORCO/VICODIN ?Take 1 tablet by mouth every 6 (six) hours as needed for moderate pain. Can take every 4 hours if needed. ?  ?levofloxacin 750 MG tablet ?Commonly known as: Levaquin ?Take 1 tablet (750 mg total) by mouth daily for 5 days. ?  ?lisinopril 20 MG tablet ?Commonly known as: ZESTRIL ?Take 1.5 tablets (30 mg total) by mouth daily. ?  ?metFORMIN 1000 MG tablet ?Commonly known as: GLUCOPHAGE ?Take 1 tablet (1,000 mg total) by mouth 2 (two) times daily with a meal. ?  ?metoprolol succinate 100 MG 24 hr tablet ?Commonly known as: TOPROL-XL ?Take 1 tablet (100 mg total) by mouth daily. Take with or immediately following a meal. ?  ?omeprazole 20 MG capsule ?Commonly known as: PRILOSEC ?Take 1 capsule (20 mg total) by mouth daily. ?  ?ONE TOUCH ULTRA MINI w/Device Kit ?1 each by Does not apply route 2 (two) times daily. ?  ?potassium chloride SA 20 MEQ tablet ?Commonly known as: KLOR-CON M ?TAKE 1  BY MOUTH ONCE DAILY ?What changed: See the new instructions. ?  ?pravastatin 40 MG tablet ?Commonly known as: PRAVACHOL ?Take 1 tablet (40 mg total) by mouth every evening. ?  ?warfarin 5 MG tablet ?Commonly known as: COUMADIN ?Take as directed. If you are unsure how to take  this medication, talk to your nurse or doctor. ?Original instructions: TAKE 1 & 1/2 (ONE & ONE-HALF) TABLETS BY MOUTH ONCE DAILY 6 IN THE EVENING ?What changed:  ?how much to take ?how to take this ?when to take this ?additional instructions ?  ? ?  ? ? Follow-up Information   ? ? Dettinger, Fransisca Kaufmann, MD Follow up in 2 week(s).   ?Specialties: Family Medicine, Cardiology ?Contact information: ?[address] ?[place] [address] ?[phone] ? ? ?  ?  ? ?  ?  ? ?  ? ?Allergies  ?Allergen Reactions  ? Allopurinol Other (See Comments)  ?  Significantly decreased WBC's  ? Colchicine Other (See Comments)  ?  Significantly decreased WBC's  ? ? ?Consultations: ?None ? ? ?Procedures/Studies: ?DG Chest Port 1 View ? ?Result Date: 03/12/2022 ?CLINICAL DATA:  Questionable sepsis EXAM: PORTABLE CHEST 1 VIEW COMPARISON:  09/30/2020 FINDINGS: Heart is normal size. Patchy left basilar atelectasis or infiltrate. Right lung clear. No effusions or acute bony abnormality. IMPRESSION: Patchy left lower lobe atelectasis or infiltrate/pneumonia. Electronically Signed   By:  Rolm Baptise M.D.   On: 03/12/2022 02:39   ?(Echo, Carotid, EGD, Colonoscopy, ERCP)  ? ? ?Subjective: Patient seen and examined.  No overnight events.  Mobilized around without shortness of breath.  No cough.  Eager to go home. ? ? ?Discharge Exam: ?Vitals:  ? 03/14/22 0456 03/14/22 1036  ?BP: (!) 144/68 (!) 146/61  ?Pulse: (!) 58 (!) 57  ?Resp:  16  ?Temp: 99.2 ?F (37.3 ?C) 98.6 ?F (37 ?C)  ?SpO2: 98% 99%  ? ?Vitals:  ? 03/13/22 2013 03/14/22 0008 03/14/22 0456 03/14/22 1036  ?BP: (!) 148/71 (!) 156/67 (!) 144/68 (!) 146/61  ?Pulse: 65 60 (!) 58 (!) 57  ?Resp:    16  ?Temp: 99.2 ?F (37.3 ?C) 98.2 ?F (36.8 ?C) 99.2 ?F (37.3 ?C) 98.6 ?F (37 ?C)  ?TempSrc: Oral Oral Oral Oral  ?SpO2:  98% 98% 99%  ?Weight:      ?Height:      ? ? ?General: Pt is alert, awake, not in acute distress ?Walking around in room air. ?Cardiovascular: RRR, S1/S2 +, no rubs, no gallops, implanted  port on the right chest. ?Respiratory: CTA bilaterally, no wheezing, no rhonchi ?Abdominal: Soft, NT, ND, bowel sounds + ?Extremities: no edema, no cyanosis ? ? ? ?The results of significant diagnostics from th

## 2022-03-14 NOTE — Progress Notes (Signed)
ANTICOAGULATION CONSULT NOTE - Initial Consult ? ?Pharmacy Consult for warfarin ?Indication: atrial fibrillation ? ?Allergies  ?Allergen Reactions  ? Allopurinol Other (See Comments)  ?  Significantly decreased WBC's  ? Colchicine Other (See Comments)  ?  Significantly decreased WBC's  ? ? ?Patient Measurements: ?Height: '5\' 5"'$  (165.1 cm) ?Weight: 73.5 kg (162 lb) ?IBW/kg (Calculated) : 61.5 ?Heparin Dosing Weight: 73.5 kg ? ?Vital Signs: ?Temp: 99.2 ?F (37.3 ?C) (04/04 0456) ?Temp Source: Oral (04/04 0456) ?BP: 144/68 (04/04 0456) ?Pulse Rate: 58 (04/04 0456) ? ?Labs: ?Recent Labs  ?  03/12/22 ?0215 03/12/22 ?0245 03/12/22 ?7829 03/13/22 ?0138 03/14/22 ?0219  ?HGB 9.0*  --  8.0* 9.2* 8.9*  ?HCT 26.9*  --  24.6* 28.2* 27.1*  ?PLT 172  --  158 160 172  ?APTT  --  31  --   --   --   ?LABPROT  --  17.1*  --  18.2* 19.4*  ?INR  --  1.4*  --  1.5* 1.6*  ?CREATININE 1.33*  --  1.21  --  1.04  ? ? ? ?Estimated Creatinine Clearance: 50.1 mL/min (by C-G formula based on SCr of 1.04 mg/dL). ? ? ?Medical History: ?Past Medical History:  ?Diagnosis Date  ? Anticoagulated on Coumadin   ? managed by pcp  ? Arthritis   ? BPH with urinary obstruction   ? Diverticulosis of colon   ? ED (erectile dysfunction)   ? 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 found during hospital stay with pancreatitis,  started coumadin;   last Abd CT in epic 07-29-2020  chronic thrombus portal venous  ? GERD (gastroesophageal reflux disease)   ? Heart murmur   ? History of acute pancreatitis   ? 2016   necrotizing pancreatitis  ? History of adenomatous polyp of colon   ? History of DVT of lower extremity   ? History of GI bleed 08/2019  ? upper gi bleed due to duodenal ulcer  ? History of gout yrs ago  ? Hypertension   ? followed by pcp  ? Hypertrophic cardiomyopathy ([place])   ? followed by cardiology  ? Internal hemorrhoids   ? Mixed hyperlipidemia   ? Normocytic anemia   ? PAF (paroxysmal atrial fibrillation) ([place]) 04/2016  ? cardiologist--  dr h. Tamala Julian--  event monitor 07-04-2017 epic, NSR/ PAflutter with occasional RVR/  PACs/ PVCs ;  echo 05-16-2017 epic,  moderate LVH with severe asymptomic septal hypertrophy, G1DD, EF 60-65%,  mild AR, mild LAE  ? Pancreatic pseudocyst   ? Peyronie's disease   ? Prostate cancer [facility]) urologist-- dr wrenn/ oncologist-- dr Tammi Klippel  ? first dx 08/ 2009 Gleason 3+3 active survelliance;  until bx 07/ 2021  Stage T2b, Gleason 4+3  ? Right inguinal hernia   ? Type 2 diabetes mellitus ([place])   ? followed by pcp---  (11-03-2020 per pt currently not checking blood sugar due to glucose monitor broken)  ? Wears glasses   ? Wears partial dentures   ? upper and lower  ? ? ? ?Assessment: 81 y.o. male with medical history significant of prostate cancer with bone mets, paroxysmal A-fib on Coumadin, DVT, BPH, GERD, pancreatitis, gout, hypertension, hypertrophic cardiomyopathy, hyperlipidemia, non-insulin-dependent type 2 diabetes presenting to the ED for evaluation of fever and shortness of breath.  Prior to admission last warfarin dose 03/10/22.  ? ?Warfarin regimen reported: 7.'5mg'$  M,W,F and '5mg'$  T,Th, Sat, and Sun  ? ?INR subtherapeutic at 1.6 after '10mg'$  x 2. He missed doses  on 4/1 and 4/2. H/H, plt stable.  ? ?Goal of Therapy:  ?INR 2-3 ?Monitor platelets by anticoagulation protocol: Yes ?  ?Plan:  ?Warfarin 12.5 mg PO x1 today ?Daily INR and CBC ?Monitor for s/sx of bleeding ? ?Alan [name], PharmD, BCPS, BCCP ?Clinical Pharmacist ? ?Please check AMION for all [place] phone numbers ?After 10:00 PM, call [place] [phone] ? ?

## 2022-03-14 NOTE — Plan of Care (Signed)

## 2022-03-14 NOTE — Progress Notes (Signed)
Mobility Specialist Progress Note  ? ? 03/14/22 1240  ?Mobility  ?Activity Ambulated independently in hallway  ?Level of Assistance Modified independent, requires aide device or extra time  ?Assistive Device Other (Comment) ?(IV pole)  ?Distance Ambulated (ft) 470 ft  ?Activity Response Tolerated well  ?$Mobility charge 1 Mobility  ? ?Pt received in bed and agreeable. No complaints on walk. Returned to sitting EOB with call bell in reach.   ? ?Hildred Alamin ?Mobility Specialist  ?  ?

## 2022-03-17 LAB — CULTURE, BLOOD (ROUTINE X 2)
Culture: NO GROWTH
Culture: NO GROWTH
Special Requests: ADEQUATE

## 2022-03-20 ENCOUNTER — Telehealth: Payer: Self-pay | Admitting: Interventional Cardiology

## 2022-03-20 MED ORDER — CHLORTHALIDONE 25 MG PO TABS
25.0000 mg | ORAL_TABLET | Freq: Every day | ORAL | 0 refills | Status: DC
Start: 2022-03-20 — End: 2022-04-17

## 2022-03-20 NOTE — Telephone Encounter (Signed)
?*  STAT* If patient is at the pharmacy, call can be transferred to refill team. ? ? ?1. Which medications need to be refilled? (please list name of each medication and dose if known) chlorthalidone (HYGROTON) 25 MG tablet ? ?2. Which pharmacy/location (including street and city if local pharmacy) is medication to be sent to? [place], [place] [address] ? ?3. Do they need a 30 day or 90 day supply? 30 day ? ? ?Pt has appt with Dr. Tamala Julian on 03/22/22 ?

## 2022-03-20 NOTE — Progress Notes (Signed)
?Cardiology Office Note:   ? ?Date:  03/22/2022  ? ?ID:  [name], DOB 02/11/1941, MRN [id number] ? ?PCP:  Dettinger, Fransisca Kaufmann, MD  ?Cardiologist:  Sinclair Grooms, MD  ? ?Referring MD: Dettinger, Fransisca Kaufmann, MD  ? ?Chief Complaint  ?Patient presents with  ? Hypertension  ? Follow-up  ? Congestive Heart Failure  ?  Hypertrophic cardiomyopathy  ? ? ?History of Present Illness:   ? ?Bejamin [name]. is a 81 y.o. male with a hx of hypertrophic cardiomyopathy, essential hypertension, chronic anticoagulation therapy, and paroxysmal atrial fibrillation.  Prostate cancer metastatic to bone. ? ?He now has metastatic prostate cancer.  It is spread to his bone. ? ?No cardiac symptoms.  Has chronic shortness of breath but that has been present for quite some time. ? ?Recent hospitalization for community-acquired pneumonia.  Prior history of PAF.  Longstanding history of primary hypertension. ? ?Past Medical History:  ?Diagnosis Date  ? AKI (acute kidney injury) ([place]) 09/22/2019  ? Anticoagulated on Coumadin   ? managed by pcp  ? Arthritis   ? BPH with urinary obstruction   ? Diverticulosis of colon   ? ED (erectile dysfunction)   ? 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 found during hospital stay with pancreatitis,  started coumadin;   last Abd CT in epic 07-29-2020  chronic thrombus portal venous  ? GERD (gastroesophageal reflux disease)   ? Heart murmur   ? History of acute pancreatitis   ? 2016   necrotizing pancreatitis  ? History of adenomatous polyp of colon   ? History of DVT of lower extremity   ? History of GI bleed 08/2019  ? upper gi bleed due to duodenal ulcer  ? History of gout yrs ago  ? Hypertension   ? followed by pcp  ? Hypertrophic cardiomyopathy ([place])   ? followed by cardiology  ? Internal hemorrhoids   ? Mixed hyperlipidemia   ? Normocytic anemia   ? PAF (paroxysmal atrial fibrillation) ([place]) 04/2016  ? cardiologist-- dr h. Tamala Julian--  event monitor 07-04-2017 epic, NSR/ PAflutter with  occasional RVR/  PACs/ PVCs ;  echo 05-16-2017 epic,  moderate LVH with severe asymptomic septal hypertrophy, G1DD, EF 60-65%,  mild AR, mild LAE  ? Pancreatic pseudocyst   ? Peyronie's disease   ? Prostate cancer [facility]) urologist-- dr wrenn/ oncologist-- dr Tammi Klippel  ? first dx 08/ 2009 Gleason 3+3 active survelliance;  until bx 07/ 2021  Stage T2b, Gleason 4+3  ? Right inguinal hernia   ? Type 2 diabetes mellitus ([place])   ? followed by pcp---  (11-03-2020 per pt currently not checking blood sugar due to glucose monitor broken)  ? Wears glasses   ? Wears partial dentures   ? upper and lower  ? ? ?Past Surgical History:  ?Procedure Laterality Date  ? BIOPSY  02/09/2022  ? Procedure: BIOPSY;  Surgeon: Milus Banister, MD;  Location: [facility];  Service: Endoscopy;;  ? CHOLECYSTECTOMY N/A 11/26/2015  ? Procedure: LAPAROSCOPIC CHOLECYSTECTOMY;  Surgeon: Aviva Signs, MD;  [facility]: [facility];  Service: General;  Laterality: N/A;  ? COLONOSCOPY  last one 09-06-2020  dr stark  ? CYSTOSCOPY N/A 11/09/2020  ? Procedure: CYSTOSCOPY FLEXIBLE;  Surgeon: Irine Seal, MD;  Location: [facility];  Service: Urology;  Laterality: N/A;  ? ESOPHAGOGASTRODUODENOSCOPY  10-06-2019  '@[facility]'   ? ESOPHAGOGASTRODUODENOSCOPY N/A 02/09/2022  ? Procedure: ESOPHAGOGASTRODUODENOSCOPY (EGD);  Surgeon: Milus Banister, MD;  [facility]: [facility];  Service: Endoscopy;  Laterality: N/A;  ? EUS N/A 02/09/2022  ? Procedure: UPPER ENDOSCOPIC ULTRASOUND (EUS) RADIAL;  Surgeon: Milus Banister, MD;  [facility]: [facility];  Service: Endoscopy;  Laterality: N/A;  ? FLEXIBLE BRONCHOSCOPY Bilateral 04/14/2016  ? Procedure: FLEXIBLE BRONCHOSCOPY;  Surgeon: Sinda Du, MD;  [facility];  Service: Cardiopulmonary;  Laterality: Bilateral;  ? HEMORRHOID SURGERY  yrs ago  ? RADIOACTIVE SEED IMPLANT N/A 11/09/2020  ? Procedure: RADIOACTIVE SEED IMPLANT/BRACHYTHERAPY IMPLANT;  Surgeon: Irine Seal, MD;  Location[facility];   Service: Urology;  Laterality: N/A;  ? SPACE OAR INSTILLATION N/A 11/09/2020  ? Procedure: SPACE OAR INSTILLATION;  Surgeon: Irine Seal, MD;  Location: [facility];  Service: Urology;  Laterality: N/A;  ? UMBILICAL HERNIA REPAIR N/A 11/26/2015  ? Procedure: UMBILICAL HERNIORRHAPHY;  Surgeon: Aviva Signs, MD;  Location: [facility];  Service: General;  Laterality: N/A;  ? ? ?Current Medications: ?Current Meds  ?Medication Sig  ? acetaminophen (TYLENOL) 500 MG tablet Take 1,000 mg by mouth every 6 (six) hours as needed for moderate pain.  ? amLODipine (NORVASC) 10 MG tablet Take 1 tablet (10 mg total) by mouth every evening.  ? apalutamide (ERLEADA) 60 MG tablet Take 4 tablets (240 mg total) by mouth daily. Take as directed. (Patient taking differently: Take 180 mg by mouth daily. Take as directed.)  ? Blood Glucose Monitoring Suppl (ONE TOUCH ULTRA MINI) w/Device KIT 1 each by Does not apply route 2 (two) times daily.  ? Calcium Carb-Cholecalciferol (CALCIUM 600 + D PO) Take 1 tablet by mouth in the morning and at bedtime.  ? chlorthalidone (HYGROTON) 25 MG tablet Take 1 tablet (25 mg total) by mouth daily.  ? diclofenac Sodium (VOLTAREN) 1 % GEL Apply 1 application topically 2 (two) times daily as needed (pain).  ? FEROSUL 325 (65 Fe) MG tablet Take 1 tablet by mouth once daily with breakfast (Patient taking differently: Take 325 mg by mouth daily with breakfast.)  ? fluticasone (FLONASE) 50 MCG/ACT nasal spray Place 1 spray into both nostrils 2 (two) times daily as needed for allergies or rhinitis.  ? glucose blood (ONE TOUCH ULTRA TEST) test strip Use to check BG once daily.  Dx:  Type 2 DM controlled E11.9  ? HYDROcodone-acetaminophen (NORCO/VICODIN) 5-325 MG tablet Take 1 tablet by mouth every 6 (six) hours as needed for moderate pain. Can take every 4 hours if needed.  ? lisinopril (ZESTRIL) 20 MG tablet Take 1.5 tablets (30 mg total) by mouth daily.  ? metFORMIN (GLUCOPHAGE) 1000 MG tablet Take 1  tablet (1,000 mg total) by mouth 2 (two) times daily with a meal.  ? metoprolol succinate (TOPROL-XL) 100 MG 24 hr tablet Take 1 tablet (100 mg total) by mouth daily. Take with or immediately following a meal.  ? omeprazole (PRILOSEC) 20 MG capsule Take 1 capsule (20 mg total) by mouth daily.  ? potassium chloride SA (KLOR-CON M) 20 MEQ tablet TAKE 1  BY MOUTH ONCE DAILY (Patient taking differently: Take 20 mEq by mouth daily.)  ? pravastatin (PRAVACHOL) 40 MG tablet Take 1 tablet (40 mg total) by mouth every evening.  ? warfarin (COUMADIN) 5 MG tablet TAKE 1 & 1/2 (ONE & ONE-HALF) TABLETS BY MOUTH ONCE DAILY 6 IN THE EVENING (Patient taking differently: Take 7.5 mg by mouth See admin instructions. 7.5 mg Monday,Wednesday,Friday ?5 mg Tuesday,Thursday,Saturday and sunday)  ?  ? ?Allergies:   Allopurinol and Colchicine  ? ?Social History  ? ?  Socioeconomic History  ? Marital status: Married  ?  Spouse name: Arville Go  ? Number of children: 4  ? Years of education: Not on file  ? Highest education level: 10th grade  ?Occupational History  ? Occupation: Retired   ?  Employer: [employer]  ?Tobacco Use  ? Smoking status: Former  ?  Years: 5.00  ?  Types: Cigarettes  ?  Quit date: 12/11/1986  ?  Years since quitting: 35.3  ? Smokeless tobacco: Never  ?Vaping Use  ? Vaping Use: Never used  ?Substance and Sexual Activity  ? Alcohol use: No  ? Drug use: Never  ? Sexual activity: Yes  ?Other Topics Concern  ? Not on file  ?Social History Narrative  ? Not on file  ? ?Social Determinants of Health  ? ?Financial Resource Strain: Low Risk   ? Difficulty of Paying Living Expenses: Not hard at all  ?Food Insecurity: No Food Insecurity  ? Worried About Charity fundraiser in the Last Year: Never true  ? Ran Out of Food in the Last Year: Never true  ?Transportation Needs: No Transportation Needs  ? Lack of Transportation (Medical): No  ? Lack of Transportation (Non-Medical): No  ?Physical Activity: Inactive  ? Days of Exercise per Week: 0  days  ? Minutes of Exercise per Session: 0 min  ?Stress: Stress Concern Present  ? Feeling of Stress : To some extent  ?Social Connections: Socially Integrated  ? Frequency of Communication with Friends and Family

## 2022-03-20 NOTE — Telephone Encounter (Signed)
Pt's medication was sent to pt's pharmacy as requested. Confirmation received.  °

## 2022-03-21 ENCOUNTER — Ambulatory Visit (INDEPENDENT_AMBULATORY_CARE_PROVIDER_SITE_OTHER): Payer: No Typology Code available for payment source | Admitting: Family Medicine

## 2022-03-21 ENCOUNTER — Ambulatory Visit (INDEPENDENT_AMBULATORY_CARE_PROVIDER_SITE_OTHER): Payer: No Typology Code available for payment source | Admitting: Physician Assistant

## 2022-03-21 ENCOUNTER — Inpatient Hospital Stay (HOSPITAL_COMMUNITY): Payer: No Typology Code available for payment source | Attending: Hematology | Admitting: Hematology

## 2022-03-21 ENCOUNTER — Encounter: Payer: Self-pay | Admitting: Family Medicine

## 2022-03-21 VITALS — BP 138/52 | HR 63 | Temp 98.5°F | Resp 17 | Ht 65.0 in | Wt 149.4 lb

## 2022-03-21 VITALS — BP 134/44 | HR 41 | Temp 97.1°F | Ht 65.0 in | Wt 149.6 lb

## 2022-03-21 DIAGNOSIS — Z808 Family history of malignant neoplasm of other organs or systems: Secondary | ICD-10-CM | POA: Diagnosis not present

## 2022-03-21 DIAGNOSIS — Z7901 Long term (current) use of anticoagulants: Secondary | ICD-10-CM

## 2022-03-21 DIAGNOSIS — Z803 Family history of malignant neoplasm of breast: Secondary | ICD-10-CM | POA: Insufficient documentation

## 2022-03-21 DIAGNOSIS — I480 Paroxysmal atrial fibrillation: Secondary | ICD-10-CM | POA: Insufficient documentation

## 2022-03-21 DIAGNOSIS — C7951 Secondary malignant neoplasm of bone: Secondary | ICD-10-CM | POA: Insufficient documentation

## 2022-03-21 DIAGNOSIS — Z79899 Other long term (current) drug therapy: Secondary | ICD-10-CM | POA: Diagnosis not present

## 2022-03-21 DIAGNOSIS — E119 Type 2 diabetes mellitus without complications: Secondary | ICD-10-CM | POA: Diagnosis not present

## 2022-03-21 DIAGNOSIS — Z86718 Personal history of other venous thrombosis and embolism: Secondary | ICD-10-CM | POA: Diagnosis not present

## 2022-03-21 DIAGNOSIS — R6889 Other general symptoms and signs: Secondary | ICD-10-CM

## 2022-03-21 DIAGNOSIS — Z87891 Personal history of nicotine dependence: Secondary | ICD-10-CM | POA: Diagnosis not present

## 2022-03-21 DIAGNOSIS — C61 Malignant neoplasm of prostate: Secondary | ICD-10-CM | POA: Insufficient documentation

## 2022-03-21 DIAGNOSIS — Z823 Family history of stroke: Secondary | ICD-10-CM | POA: Insufficient documentation

## 2022-03-21 DIAGNOSIS — I10 Essential (primary) hypertension: Secondary | ICD-10-CM | POA: Diagnosis not present

## 2022-03-21 DIAGNOSIS — K219 Gastro-esophageal reflux disease without esophagitis: Secondary | ICD-10-CM | POA: Insufficient documentation

## 2022-03-21 DIAGNOSIS — R197 Diarrhea, unspecified: Secondary | ICD-10-CM | POA: Insufficient documentation

## 2022-03-21 DIAGNOSIS — J189 Pneumonia, unspecified organism: Secondary | ICD-10-CM

## 2022-03-21 DIAGNOSIS — Z818 Family history of other mental and behavioral disorders: Secondary | ICD-10-CM | POA: Diagnosis not present

## 2022-03-21 DIAGNOSIS — Z8249 Family history of ischemic heart disease and other diseases of the circulatory system: Secondary | ICD-10-CM | POA: Insufficient documentation

## 2022-03-21 DIAGNOSIS — R059 Cough, unspecified: Secondary | ICD-10-CM | POA: Diagnosis not present

## 2022-03-21 LAB — COAGUCHEK XS/INR WAIVED
INR: 1.8 — ABNORMAL HIGH (ref 0.9–1.1)
Prothrombin Time: 21.5 s

## 2022-03-21 MED ORDER — DEGARELIX ACETATE 80 MG ~~LOC~~ SOLR
80.0000 mg | Freq: Once | SUBCUTANEOUS | Status: AC
  Administered 2022-03-21: 80 mg via SUBCUTANEOUS

## 2022-03-21 NOTE — Patient Instructions (Addendum)
[place] at [facility] ?Discharge Instructions ? ?You were seen and examined today by Dr. Delton Coombes. He reviewed your most recent labs and everything looks okay except your blood count is slightly low. Increase the Erleada to 4 pills daily. We will do further labs when you come back to see [place] in 2 weeks and will also start you on Xgeva injection for your bones at that time.  Please keep follow up appointments as scheduled. ? ? ?Thank you for choosing [place] at [facility] to provide your oncology and hematology care.  To afford each patient quality time with our provider, please arrive at least 15 minutes before your scheduled appointment time.  ? ?If you have a lab appointment with the [facility] please come in thru the Main Entrance and check in at the main information desk. ? ?You need to re-schedule your appointment should you arrive 10 or more minutes late.  We strive to give you quality time with our providers, and arriving late affects you and other patients whose appointments are after yours.  Also, if you no show three or more times for appointments you may be dismissed from the clinic at the providers discretion.     ?Again, thank you for choosing [facility].  Our hope is that these requests will decrease the amount of time that you wait before being seen by our physicians.       ?_____________________________________________________________ ? ?Should you have questions after your visit to [facility], please contact our office at [phone] and follow the prompts.  Our office hours are 8:00 a.m. and 4:30 p.m. Monday - Friday.  Please note that voicemails left after 4:00 p.m. may not be returned until the following business day.  We are closed weekends and major holidays.  You do have access to a nurse 24-7, just call the main number to the clinic [phone] and do not press any options, hold on the line and a nurse  will answer the phone.   ? ?For prescription refill requests, have your pharmacy contact our office and allow 72 hours.   ? ?Due to Covid, you will need to wear a mask upon entering the hospital. If you do not have a mask, a mask will be given to you at the Main Entrance upon arrival. For doctor visits, patients may have 1 support person age 71 or older with them. For treatment visits, patients can not have anyone with them due to social distancing guidelines and our immunocompromised population.  ? ?  ?

## 2022-03-21 NOTE — Progress Notes (Signed)
Firmagon Sub Q Injection ? ?Due to Prostate Cancer patient is present today for a Firmagon Injection.  ? ?Medication: Mills Koller (Degarelix)  ?Dose: '80mg'$  ?Location: right upper abdomen ?Lot: [id number] ?Exp: 04/10/2024 ? ?Patient tolerated well, no complications were noted ? ?Performed by: Levi Aland, CMA ? ?Follow up: Follow up as scheduled.   ?

## 2022-03-21 NOTE — Progress Notes (Signed)
? ?[facility] ?[address] ?[place], [place] [address] ? ? ?CLINIC:  ?Medical Oncology/Hematology ? ?PCP:  ?[name], Alan Kaufmann, MD ?[place] / [place] [address] ?[phone] ? ? 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 ?PRIOR THERAPY: none ? ?NGS Results: not done ? ?CURRENT THERAPY: surveillance ? ?BRIEF ONCOLOGIC HISTORY:  ?Oncology History  ?Prostate cancer ([place])  ?04/13/2014 Initial Diagnosis  ? Prostate cancer ([place]) ?  ?06/25/2020 Cancer Staging  ? Staging form: Prostate, AJCC 8th Edition ?- Clinical stage from 06/25/2020: Stage IIC (cT2b, cN0, cM0, PSA: 12.8, Grade Group: 3) - Signed by Alan Caldron, PA-C on 09/29/2020 ? ?  ? ? ?CANCER STAGING: ? Cancer Staging  ?Prostate cancer ([place]) ?Staging form: Prostate, AJCC 8th Edition ?- Clinical stage from 06/25/2020: Stage IIC (cT2b, cN0, cM0, PSA: 12.8, Grade Group: 3) - Signed by Alan Caldron, PA-C on 09/29/2020 ?- Pathologic stage from 01/26/2022: Stage IVB (pT2, pN0, pM1b, Grade Group: 3) - Unsigned ? ? ?INTERVAL HISTORY:  ?[name], a 81 y.o. 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 Alan [name] was last seen on 02/28/2022.  ? ?Today he reports feeling good. He denies severe fatigue and weakness. He is taking 3 ERLEADA pills daily. He reports mild watery diarrhea which occurs 2-3 times daily which was present prior to starting ERLEADA.  He has had 2 falls on 3/28 and 4/2; he fell after losing his balance. He reports a cough productive of clear sputum.  ? ?REVIEW OF SYSTEMS:  ?Review of Systems  ?Constitutional:  Negative for appetite change and fatigue.  ?Respiratory:  Positive for cough.   ?Gastrointestinal:  Positive for diarrhea.  ?Neurological:  Negative for extremity weakness.  ?All other systems reviewed and are negative. ? ?PAST MEDICAL/SURGICAL HISTORY:  ?Past Medical History:  ?Diagnosis Date  ? AKI (acute kidney injury) ([place]) 09/22/2019  ? Anticoagulated on Coumadin   ?  managed by pcp  ? Arthritis   ? BPH with urinary obstruction   ? Diverticulosis of colon   ? ED (erectile dysfunction)   ? 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 found during hospital stay with pancreatitis,  started coumadin;   last Abd CT in epic 07-29-2020  chronic thrombus portal venous  ? GERD (gastroesophageal reflux disease)   ? Heart murmur   ? History of acute pancreatitis   ? 2016   necrotizing pancreatitis  ? History of adenomatous polyp of colon   ? History of DVT of lower extremity   ? History of GI bleed 08/2019  ? upper gi bleed due to duodenal ulcer  ? History of gout yrs ago  ? Hypertension   ? followed by pcp  ? Hypertrophic cardiomyopathy ([place])   ? followed by cardiology  ? Internal hemorrhoids   ? Mixed hyperlipidemia   ? Normocytic anemia   ? PAF (paroxysmal atrial fibrillation) ([place]) 04/2016  ? cardiologist-- dr Alan [name]--  event monitor 07-04-2017 epic, NSR/ PAflutter with occasional RVR/  PACs/ PVCs ;  echo 05-16-2017 epic,  moderate LVH with severe asymptomic septal hypertrophy, G1DD, EF 60-65%,  mild AR, mild LAE  ? Pancreatic pseudocyst   ? Peyronie's disease   ? Prostate cancer [facility]) urologist-- dr Alan [name]/ oncologist-- dr Alan [name]  ? first dx 08/ 2009 Gleason 3+3 active survelliance;  until bx 07/ 2021  Stage T2b, Gleason 4+3  ? Right inguinal hernia   ? Type 2 diabetes  mellitus ([place])   ? followed by pcp---  (11-03-2020 per pt currently not checking blood sugar due to glucose monitor broken)  ? Wears glasses   ? Wears partial dentures   ? upper and lower  ? ?Past Surgical History:  ?Procedure Laterality Date  ? BIOPSY  02/09/2022  ? Procedure: BIOPSY;  Surgeon: Alan Banister, MD;  [facility]: [name];  Service: Endoscopy;;  ? CHOLECYSTECTOMY N/A 11/26/2015  ? Procedure: LAPAROSCOPIC CHOLECYSTECTOMY;  Surgeon: Alan Signs, MD;  [facility];  Service: General;  Laterality: N/A;  ? COLONOSCOPY  last one 09-06-2020  dr Alan [name]  ? CYSTOSCOPY N/A 11/09/2020  ? Procedure:  CYSTOSCOPY FLEXIBLE;  Surgeon: Alan Seal, MD;  Location[facility];  Service: Urology;  Laterality: N/A;  ? ESOPHAGOGASTRODUODENOSCOPY  10-06-2019  '@[facility]'$   ? ESOPHAGOGASTRODUODENOSCOPY N/A 02/09/2022  ? Procedure: ESOPHAGOGASTRODUODENOSCOPY (EGD);  Surgeon: Alan Banister, MD;  Location: [name];  Service: Endoscopy;  Laterality: N/A;  ? EUS N/A 02/09/2022  ? Procedure: UPPER ENDOSCOPIC ULTRASOUND (EUS) RADIAL;  Surgeon: Alan Banister, MD;  [facility];  Service: Endoscopy;  Laterality: N/A;  ? FLEXIBLE BRONCHOSCOPY Bilateral 04/14/2016  ? Procedure: FLEXIBLE BRONCHOSCOPY;  Surgeon: Alan Du, MD;  [facility];  Service: Cardiopulmonary;  Laterality: Bilateral;  ? HEMORRHOID SURGERY  yrs ago  ? RADIOACTIVE SEED IMPLANT N/A 11/09/2020  ? Procedure: RADIOACTIVE SEED IMPLANT/BRACHYTHERAPY IMPLANT;  Surgeon: Alan Seal, MD;  Location: [facility];  Service: Urology;  Laterality: N/A;  ? SPACE OAR INSTILLATION N/A 11/09/2020  ? Procedure: SPACE OAR INSTILLATION;  Surgeon: Alan Seal, MD;  Location: [facility];  Service: Urology;  Laterality: N/A;  ? UMBILICAL HERNIA REPAIR N/A 11/26/2015  ? Procedure: UMBILICAL HERNIORRHAPHY;  Surgeon: Alan Signs, MD;  [facility];  Service: General;  Laterality: N/A;  ? ? ?SOCIAL HISTORY:  ?Social History  ? ?Socioeconomic History  ? Marital status: Married  ?  Spouse name: Alan [name]  ? Number of children: 4  ? Years of education: Not on file  ? Highest education level: 10th grade  ?Occupational History  ? Occupation: Retired   ?  Employer: [employer]  ?Tobacco Use  ? Smoking status: Former  ?  Years: 5.00  ?  Types: Cigarettes  ?  Quit date: 12/11/1986  ?  Years since quitting: 35.2  ? Smokeless tobacco: Never  ?Vaping Use  ? Vaping Use: Never used  ?Substance and Sexual Activity  ? Alcohol use: No  ? Drug use: Never  ? Sexual activity: Yes  ?Other Topics Concern  ? Not on file  ?Social History Narrative   ? Not on file  ? ?Social Determinants of Health  ? ?Financial Resource Strain: Low Risk   ? Difficulty of Paying Living Expenses: Not hard at all  ?Food Insecurity: No Food Insecurity  ? Worried About Charity fundraiser in the Last Year: Never true  ? Ran Out of Food in the Last Year: Never true  ?Transportation Needs: No Transportation Needs  ? Lack of Transportation (Medical): No  ? Lack of Transportation (Non-Medical): No  ?Physical Activity: Inactive  ? Days of Exercise per Week: 0 days  ? Minutes of Exercise per Session: 0 min  ?Stress: Stress Concern Present  ? Feeling of Stress : To some extent  ?Social Connections: Socially Integrated  ? Frequency of Communication with Friends and Family: Three times a week  ? Frequency of Social Gatherings with Friends and Family: Three  times a week  ? Attends Religious Services: More than 4 times per year  ? Active Member of Clubs or Organizations: Yes  ? Attends Archivist Meetings: More than 4 times per year  ? Marital Status: Married  ?Intimate Partner Violence: Not At Risk  ? Fear of Current or Ex-Partner: No  ? Emotionally Abused: No  ? Physically Abused: No  ? Sexually Abused: No  ? ? ?FAMILY HISTORY:  ?Family History  ?Problem Relation Age of Onset  ? Stroke Mother   ? Hypertension Mother   ? Alzheimer's disease Mother   ? Hypertension Father   ? Brain cancer Sister   ? Breast cancer Sister   ? Hypertension Sister   ? Deep vein thrombosis Brother   ? Hypertension Brother   ? Colon cancer Neg Hx   ? Pancreatic disease Neg Hx   ? Esophageal cancer Neg Hx   ? Rectal cancer Neg Hx   ? Stomach cancer Neg Hx   ? ? ?CURRENT MEDICATIONS:  ?Current Outpatient Medications  ?Medication Sig Dispense Refill  ? acetaminophen (TYLENOL) 500 MG tablet Take 1,000 mg by mouth every 6 (six) hours as needed for moderate pain.    ? amLODipine (NORVASC) 10 MG tablet Take 1 tablet (10 mg total) by mouth every evening. 90 tablet 3  ? apalutamide (ERLEADA) 60 MG tablet Take 4  tablets (240 mg total) by mouth daily. Take as directed. (Patient taking differently: Take 180 mg by mouth daily. Take as directed.) 120 tablet 0  ? Blood Glucose Monitoring Suppl (ONE TOUCH ULTRA MINI) w/Dev

## 2022-03-21 NOTE — Progress Notes (Signed)
? ?Subjective:  ?Patient ID: Alan Shorey., male    DOB: Oct 29, 1941  Age: 81 y.o. MRN: [id number] ? ?CC: Stays Cold ? ? ?HPI ?Alan [name]. presents for recent pneumonia improving. Appetite is good. Feels cold a lot since recent hospitalization. He has atrial fib. Takes coumadin. Wonders if the blood being too thin is making him feel cold. ? ?Atrial fibrillation follow up. Pt. is treated with rate control and anticoagulation. Pt.  denies palpitations, rapid rate, chest pain, dyspnea and edema. There has been no bleeding from nose or gums. Pt. has not noticed blood with urine or stool.  Although there is routine bruising easily, it is not excessive. ? ? ? ?  03/21/2022  ? 10:54 AM 02/20/2022  ?  1:42 PM 01/04/2022  ?  1:22 PM  ?Depression screen PHQ 2/9  ?Decreased Interest 0 0 0  ?Down, Depressed, Hopeless 0 0 0  ?PHQ - 2 Score 0 0 0  ? ? 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), GERD (gastroesophageal reflux disease), Heart murmur, History of acute pancreatitis, History of adenomatous polyp of colon, History of DVT of lower extremity, History of GI bleed (08/2019), History of gout (yrs ago), Hypertension, Hypertrophic cardiomyopathy ([place]), Internal hemorrhoids, Mixed hyperlipidemia, Normocytic anemia, PAF (paroxysmal atrial fibrillation) ([place]) (04/2016), Pancreatic pseudocyst, Peyronie's disease, Prostate cancer [facility]) (urologist-- dr wrenn/ oncologist-- dr Tammi Klippel), Right inguinal hernia, Type 2 diabetes mellitus ([place]), Wears glasses, and Wears partial dentures.  ? ?He has a past surgical history that includes Cholecystectomy (N/A, 11/26/2015); Umbilical hernia repair (N/A, 11/26/2015); Flexible bronchoscopy (Bilateral, 04/14/2016); Colonoscopy (last one 09-06-2020  dr stark);  Esophagogastroduodenoscopy (10-06-2019  '@[facility]' ); Hemorrhoid surgery (yrs ago); Radioactive seed implant (N/A, 11/09/2020); SPACE OAR INSTILLATION (N/A, 11/09/2020); Cystoscopy (N/A, 11/09/2020); EUS (N/A, 02/09/2022); Esophagogastroduodenoscopy (N/A, 02/09/2022); and biopsy (02/09/2022).  ? ?His family history includes Alzheimer's disease in his mother; Brain cancer in his sister; Breast cancer in his sister; Deep vein thrombosis in his brother; Hypertension in his brother, father, mother, and sister; Stroke in his mother.He reports that he quit smoking about 35 years ago. His smoking use included cigarettes. He has never used smokeless tobacco. He reports that he does not drink alcohol and does not use drugs. ? ? ? ?ROS ?Review of Systems  ?Constitutional:  Negative for fever.  ?Respiratory:  Negative for shortness of breath.   ?Cardiovascular:  Negative for chest pain.  ?Endocrine: Positive for cold intolerance.  ?Musculoskeletal:  Negative for arthralgias.  ?Skin:  Negative for rash.  ? ?Objective:  ?BP (!) 134/44   Pulse (!) 41   Temp (!) 97.1 ?F (36.2 ?C)   Ht '5\' 5"'  (1.651 m)   Wt 149 lb 9.6 oz (67.9 kg)   SpO2 99%   BMI 24.89 kg/m?  ? ?BP Readings from Last 3 Encounters:  ?03/21/22 (!) 134/44  ?03/14/22 (!) 146/61  ?02/20/22 (!) 152/58  ? ? ?Wt Readings from Last 3 Encounters:  ?03/21/22 149 lb 9.6 oz (67.9 kg)  ?03/12/22 162 lb (73.5 kg)  ?02/20/22 155 lb (70.3 kg)  ? ? ? ?Physical Exam ?Constitutional:   ?   General: He is not in acute distress. ?   Appearance: He is well-developed.  ?HENT:  ?   Head: Normocephalic and atraumatic.  ?   Right Ear: External ear normal.  ?   Left  Ear: External ear normal.  ?   Nose: Nose normal.  ?Eyes:  ?   Conjunctiva/sclera: Conjunctivae normal.  ?   Pupils: Pupils are equal, round, and reactive to light.  ?Cardiovascular:  ?   Rate and Rhythm: Normal rate and regular rhythm.  ?   Heart sounds: Normal heart sounds. No murmur heard. ?Pulmonary:  ?   Effort: Pulmonary effort is  normal. No respiratory distress.  ?   Breath sounds: Normal breath sounds. No wheezing or rales.  ?Abdominal:  ?   Palpations: Abdomen is soft.  ?   Tenderness: There is no abdominal tenderness.  ?Musculoskeletal:     ?   General: Normal range of motion.  ?   Cervical back: Normal range of motion and neck supple.  ?Skin: ?   General: Skin is warm and dry.  ?Neurological:  ?   Mental Status: He is alert and oriented to person, place, and time.  ?   Deep Tendon Reflexes: Reflexes are normal and symmetric.  ?Psychiatric:     ?   Behavior: Behavior normal.     ?   Thought Content: Thought content normal.     ?   Judgment: Judgment normal.  ? ? ? ? ?Assessment & Plan:  ? ?Alan [name] was seen today for stays cold. ? ?Diagnoses and all orders for this visit: ? ?Long term (current) use of anticoagulants ?-     CoaguChek XS/INR Waived ? ?Community acquired pneumonia of left lower lobe of lung ?-     CBC with Differential/Platelet ? ?Sensation of feeling cold ?-     CBC with Differential/Platelet ?-     TSH ? ?Paroxysmal atrial fibrillation (HCC) ?-     CoaguChek XS/INR Waived ? ? ? ? ? ? ?I am having Alan [name]. maintain his fluticasone, glucose blood, acetaminophen, ONE TOUCH ULTRA MINI, metFORMIN, pravastatin, warfarin, FeroSul, apalutamide, potassium chloride SA, diclofenac Sodium, HYDROcodone-acetaminophen, amLODipine, lisinopril, metoprolol succinate, omeprazole, Calcium Carb-Cholecalciferol (CALCIUM 600 + D PO), and chlorthalidone. ? ?Allergies as of 03/21/2022   ? ?   Reactions  ? Allopurinol Other (See Comments)  ? Significantly decreased WBC's  ? Colchicine Other (See Comments)  ? Significantly decreased WBC's  ? ?  ? ?  ?Medication List  ?  ? ?  ? Accurate as of March 21, 2022 11:22 AM. If you have any questions, ask your nurse or doctor.  ?  ?  ? ?  ? ?acetaminophen 500 MG tablet ?Commonly known as: TYLENOL ?Take 1,000 mg by mouth every 6 (six) hours as needed for moderate pain. ?  ?amLODipine 10 MG tablet ?Commonly  known as: NORVASC ?Take 1 tablet (10 mg total) by mouth every evening. ?  ?CALCIUM 600 + D PO ?Take 1 tablet by mouth in the morning and at bedtime. ?  ?chlorthalidone 25 MG tablet ?Commonly known as: HYGROTON ?Take 1 tablet (25 mg total) by mouth daily. ?  ?diclofenac Sodium 1 % Gel ?Commonly known as: VOLTAREN ?Apply 1 application topically 2 (two) times daily as needed (pain). ?  ?Erleada 60 MG tablet ?Generic drug: apalutamide ?Take 4 tablets (240 mg total) by mouth daily. Take as directed. ?What changed: how much to take ?  ?FeroSul 325 (65 FE) MG tablet ?Generic drug: ferrous sulfate ?Take 1 tablet by mouth once daily with breakfast ?What changed: how much to take ?  ?fluticasone 50 MCG/ACT nasal spray ?Commonly known as: FLONASE ?Place 1 spray into both nostrils 2 (two) times daily as  needed for allergies or rhinitis. ?  ?glucose blood test strip ?Commonly known as: ONE TOUCH ULTRA TEST ?Use to check BG once daily.  Dx:  Type 2 DM controlled E11.9 ?  ?HYDROcodone-acetaminophen 5-325 MG tablet ?Commonly known as: NORCO/VICODIN ?Take 1 tablet by mouth every 6 (six) hours as needed for moderate pain. Can take every 4 hours if needed. ?  ?lisinopril 20 MG tablet ?Commonly known as: ZESTRIL ?Take 1.5 tablets (30 mg total) by mouth daily. ?  ?metFORMIN 1000 MG tablet ?Commonly known as: GLUCOPHAGE ?Take 1 tablet (1,000 mg total) by mouth 2 (two) times daily with a meal. ?  ?metoprolol succinate 100 MG 24 hr tablet ?Commonly known as: TOPROL-XL ?Take 1 tablet (100 mg total) by mouth daily. Take with or immediately following a meal. ?  ?omeprazole 20 MG capsule ?Commonly known as: PRILOSEC ?Take 1 capsule (20 mg total) by mouth daily. ?  ?ONE TOUCH ULTRA MINI w/Device Kit ?1 each by Does not apply route 2 (two) times daily. ?  ?potassium chloride SA 20 MEQ tablet ?Commonly known as: KLOR-CON M ?TAKE 1  BY MOUTH ONCE DAILY ?What changed: See the new instructions. ?  ?pravastatin 40 MG tablet ?Commonly known as:  PRAVACHOL ?Take 1 tablet (40 mg total) by mouth every evening. ?  ?warfarin 5 MG tablet ?Commonly known as: COUMADIN ?Take as directed by the anticoagulation clinic. If you are unsure how to take this medication, talk to your nurse or d

## 2022-03-22 ENCOUNTER — Encounter: Payer: Self-pay | Admitting: Interventional Cardiology

## 2022-03-22 ENCOUNTER — Ambulatory Visit (INDEPENDENT_AMBULATORY_CARE_PROVIDER_SITE_OTHER): Payer: No Typology Code available for payment source | Admitting: Interventional Cardiology

## 2022-03-22 VITALS — BP 152/68 | HR 65 | Ht 65.0 in | Wt 150.8 lb

## 2022-03-22 DIAGNOSIS — I422 Other hypertrophic cardiomyopathy: Secondary | ICD-10-CM | POA: Diagnosis not present

## 2022-03-22 DIAGNOSIS — I480 Paroxysmal atrial fibrillation: Secondary | ICD-10-CM | POA: Diagnosis not present

## 2022-03-22 DIAGNOSIS — E119 Type 2 diabetes mellitus without complications: Secondary | ICD-10-CM | POA: Diagnosis not present

## 2022-03-22 DIAGNOSIS — E1159 Type 2 diabetes mellitus with other circulatory complications: Secondary | ICD-10-CM | POA: Diagnosis not present

## 2022-03-22 DIAGNOSIS — E785 Hyperlipidemia, unspecified: Secondary | ICD-10-CM

## 2022-03-22 DIAGNOSIS — I152 Hypertension secondary to endocrine disorders: Secondary | ICD-10-CM

## 2022-03-22 DIAGNOSIS — E1169 Type 2 diabetes mellitus with other specified complication: Secondary | ICD-10-CM | POA: Diagnosis not present

## 2022-03-22 DIAGNOSIS — Z7901 Long term (current) use of anticoagulants: Secondary | ICD-10-CM

## 2022-03-22 LAB — CBC WITH DIFFERENTIAL/PLATELET
Basophils Absolute: 0 10*3/uL (ref 0.0–0.2)
Basos: 0 %
EOS (ABSOLUTE): 0.1 10*3/uL (ref 0.0–0.4)
Eos: 1 %
Hematocrit: 29.5 % — ABNORMAL LOW (ref 37.5–51.0)
Hemoglobin: 9.8 g/dL — ABNORMAL LOW (ref 13.0–17.7)
Immature Grans (Abs): 0.1 10*3/uL (ref 0.0–0.1)
Immature Granulocytes: 1 %
Lymphocytes Absolute: 1.1 10*3/uL (ref 0.7–3.1)
Lymphs: 13 %
MCH: 28.7 pg (ref 26.6–33.0)
MCHC: 33.2 g/dL (ref 31.5–35.7)
MCV: 86 fL (ref 79–97)
Monocytes Absolute: 0.7 10*3/uL (ref 0.1–0.9)
Monocytes: 9 %
Neutrophils Absolute: 6.6 10*3/uL (ref 1.4–7.0)
Neutrophils: 76 %
Platelets: 295 10*3/uL (ref 150–450)
RBC: 3.42 x10E6/uL — ABNORMAL LOW (ref 4.14–5.80)
RDW: 13.9 % (ref 11.6–15.4)
WBC: 8.6 10*3/uL (ref 3.4–10.8)

## 2022-03-22 LAB — TSH: TSH: 2.63 u[IU]/mL (ref 0.450–4.500)

## 2022-03-22 NOTE — Progress Notes (Signed)
Hello Alan [name],  Your lab result is normal and/or stable.Some minor variations that are not significant are commonly marked abnormal, but do not represent any medical problem for you.  Best regards, Chantille Navarrete, M.D.

## 2022-03-22 NOTE — Patient Instructions (Signed)
Medication Instructions:  ?.instu ? ?*If you need a refill on your cardiac medications before your next appointment, please call your pharmacy* ? ? ?Lab Work: ?None ?If you have labs (blood work) drawn today and your tests are completely normal, you will receive your results only by: ?MyChart Message (if you have MyChart) OR ?A paper copy in the mail ?If you have any lab test that is abnormal or we need to change your treatment, we will call you to review the results. ? ? ?Testing/Procedures: ?None ? ? ?Follow-Up: ?At [facility], you and your health needs are our priority.  As part of our continuing mission to provide you with exceptional heart care, we have created designated Provider Care Teams.  These Care Teams include your primary Cardiologist (physician) and Advanced Practice Providers (APPs -  Physician Assistants and Nurse Practitioners) who all work together to provide you with the care you need, when you need it. ? ?We recommend signing up for the patient portal called "MyChart".  Sign up information is provided on this After Visit Summary.  MyChart is used to connect with patients for Virtual Visits (Telemedicine).  Patients are able to view lab/test results, encounter notes, upcoming appointments, etc.  Non-urgent messages can be sent to your provider as well.   ?To learn more about what you can do with MyChart, go to [email or web address].   ? ?Your next appointment:   ?1 year(s) ? ?The format for your next appointment:   ?In Person ? ?Provider:   ?Sinclair Grooms, MD  ? ? ?Other Instructions ? ?Non-Obstructive Hypertrophic Cardiomyopathy- make your children aware of this and have them contact their physician to inquire about having some imaging of their heart performed ? ?Important Information About Sugar ? ? ? ? ?  ?

## 2022-03-23 ENCOUNTER — Inpatient Hospital Stay (HOSPITAL_COMMUNITY): Payer: No Typology Code available for payment source | Admitting: Licensed Clinical Social Worker

## 2022-03-23 NOTE — Progress Notes (Deleted)
REFERRING PROVIDER: ?Derek Jack, MD ?[address] ?[place],  [place] [address] ? ?PRIMARY PROVIDER:  ?Dettinger, Fransisca Kaufmann, MD ? ?PRIMARY REASON FOR VISIT:  ?1. Prostate cancer ([place])   ?2. 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 ? ? ?HISTORY OF PRESENT ILLNESS:   ?Mr. Kurek, a 81 y.o. male, was seen for a [place] cancer genetics consultation at the request of Dr. Delton Coombes due to a personal and family history of cancer.  Mr. Karnes presents to clinic today to discuss the possibility of a hereditary predisposition to cancer, genetic testing, and to further clarify his future cancer risks, as well as potential cancer risks for family members.  ? ?In 2009, at the age of 27, Mr. Depoy was diagnosed with prostate cancer. In 2022, his cancer was metastatic to bone.  ? ? ?CANCER HISTORY:  ?Oncology History  ?Prostate cancer ([place])  ?04/13/2014 Initial Diagnosis  ? Prostate cancer ([place]) ?  ?06/25/2020 Cancer Staging  ? Staging form: Prostate, AJCC 8th Edition ?- Clinical stage from 06/25/2020: Stage IIC (cT2b, cN0, cM0, PSA: 12.8, Grade Group: 3) - Signed by Freeman Caldron, PA-C on 09/29/2020 ? ?  ? ? ? ?Past Medical History:  ?Diagnosis Date  ? AKI (acute kidney injury) ([place]) 09/22/2019  ? Anticoagulated on Coumadin   ? managed by pcp  ? Arthritis   ? BPH with urinary obstruction   ? Diverticulosis of colon   ? ED (erectile dysfunction)   ? 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 found during hospital stay with pancreatitis,  started coumadin;   last Abd CT in epic 07-29-2020  chronic thrombus portal venous  ? GERD (gastroesophageal reflux disease)   ? Heart murmur   ? History of acute pancreatitis   ? 2016   necrotizing pancreatitis  ? History of adenomatous polyp of colon   ? History of DVT of lower extremity   ? History of GI bleed 08/2019  ? upper gi bleed due to duodenal ulcer  ? History of gout yrs ago  ? Hypertension   ? followed by pcp  ? Hypertrophic cardiomyopathy ([place])   ? followed by  cardiology  ? Internal hemorrhoids   ? Mixed hyperlipidemia   ? Normocytic anemia   ? PAF (paroxysmal atrial fibrillation) ([place]) 04/2016  ? cardiologist-- dr h. Tamala Julian--  event monitor 07-04-2017 epic, NSR/ PAflutter with occasional RVR/  PACs/ PVCs ;  echo 05-16-2017 epic,  moderate LVH with severe asymptomic septal hypertrophy, G1DD, EF 60-65%,  mild AR, mild LAE  ? Pancreatic pseudocyst   ? Peyronie's disease   ? Prostate cancer [facility]) urologist-- dr wrenn/ oncologist-- dr Tammi Klippel  ? first dx 08/ 2009 Gleason 3+3 active survelliance;  until bx 07/ 2021  Stage T2b, Gleason 4+3  ? Right inguinal hernia   ? Type 2 diabetes mellitus ([place])   ? followed by pcp---  (11-03-2020 per pt currently not checking blood sugar due to glucose monitor broken)  ? Wears glasses   ? Wears partial dentures   ? upper and lower  ? ? ?Past Surgical History:  ?Procedure Laterality Date  ? BIOPSY  02/09/2022  ? Procedure: BIOPSY;  Surgeon: Milus Banister, MD;  Location: [facility];  Service: Endoscopy;;  ? CHOLECYSTECTOMY N/A 11/26/2015  ? Procedure: LAPAROSCOPIC CHOLECYSTECTOMY;  Surgeon: Aviva Signs, MD;  Location: [facility];  Service: General;  Laterality: N/A;  ? COLONOSCOPY  last one 09-06-2020  dr stark  ? CYSTOSCOPY N/A 11/09/2020  ?  Procedure: CYSTOSCOPY FLEXIBLE;  Surgeon: Irine Seal, MD;  Location: [facility];  Service: Urology;  Laterality: N/A;  ? ESOPHAGOGASTRODUODENOSCOPY  10-06-2019  '@[facility]'   ? ESOPHAGOGASTRODUODENOSCOPY N/A 02/09/2022  ? Procedure: ESOPHAGOGASTRODUODENOSCOPY (EGD);  Surgeon: Milus Banister, MD;  Location: [facility];  Service: Endoscopy;  Laterality: N/A;  ? EUS N/A 02/09/2022  ? Procedure: UPPER ENDOSCOPIC ULTRASOUND (EUS) RADIAL;  Surgeon: Milus Banister, MD;  Location: [facility];  Service: Endoscopy;  Laterality: N/A;  ? FLEXIBLE BRONCHOSCOPY Bilateral 04/14/2016  ? Procedure: FLEXIBLE BRONCHOSCOPY;  Surgeon: Sinda Du, MD;  [facility];  Service: Cardiopulmonary;   Laterality: Bilateral;  ? HEMORRHOID SURGERY  yrs ago  ? RADIOACTIVE SEED IMPLANT N/A 11/09/2020  ? Procedure: RADIOACTIVE SEED IMPLANT/BRACHYTHERAPY IMPLANT;  Surgeon: Irine Seal, MD;  Location: [facility];  Service: Urology;  Laterality: N/A;  ? SPACE OAR INSTILLATION N/A 11/09/2020  ? Procedure: SPACE OAR INSTILLATION;  Surgeon: Irine Seal, MD;  Location: [facility];  Service: Urology;  Laterality: N/A;  ? UMBILICAL HERNIA REPAIR N/A 11/26/2015  ? Procedure: UMBILICAL HERNIORRHAPHY;  Surgeon: Aviva Signs, MD;  Location: [facility];  Service: General;  Laterality: N/A;  ? ? ?Social History  ? ?Socioeconomic History  ? Marital status: Married  ?  Spouse name: Arville Go  ? Number of children: 4  ? Years of education: Not on file  ? Highest education level: 10th grade  ?Occupational History  ? Occupation: Retired   ?  Employer: [employer]  ?Tobacco Use  ? Smoking status: Former  ?  Years: 5.00  ?  Types: Cigarettes  ?  Quit date: 12/11/1986  ?  Years since quitting: 35.3  ? Smokeless tobacco: Never  ?Vaping Use  ? Vaping Use: Never used  ?Substance and Sexual Activity  ? Alcohol use: No  ? Drug use: Never  ? Sexual activity: Yes  ?Other Topics Concern  ? Not on file  ?Social History Narrative  ? Not on file  ? ?Social Determinants of Health  ? ?Financial Resource Strain: Low Risk   ? Difficulty of Paying Living Expenses: Not hard at all  ?Food Insecurity: No Food Insecurity  ? Worried About Charity fundraiser in the Last Year: Never true  ? Ran Out of Food in the Last Year: Never true  ?Transportation Needs: No Transportation Needs  ? Lack of Transportation (Medical): No  ? Lack of Transportation (Non-Medical): No  ?Physical Activity: Inactive  ? Days of Exercise per Week: 0 days  ? Minutes of Exercise per Session: 0 min  ?Stress: Stress Concern Present  ? Feeling of Stress : To some extent  ?Social Connections: Socially Integrated  ? Frequency of Communication with Friends and Family: Three  times a week  ? Frequency of Social Gatherings with Friends and Family: Three times a week  ? Attends Religious Services: More than 4 times per year  ? Active Member of Clubs or Organizations: Yes  ? Attends Archivist Meetings: More than 4 times per year  ? Marital Status: Married  ?  ? ?FAMILY HISTORY:  ?We obtained a detailed, 4-generation family history.  Significant diagnoses are listed below: ?Family History  ?Problem Relation Age of Onset  ? Stroke Mother   ? Hypertension Mother   ? Alzheimer's disease Mother   ? Hypertension Father   ? Brain cancer Sister   ? Breast cancer Sister   ? Hypertension Sister   ? Deep vein thrombosis Brother   ?  Hypertension Brother   ? Colon cancer Neg Hx   ? Pancreatic disease Neg Hx   ? Esophageal cancer Neg Hx   ? Rectal cancer Neg Hx   ? Stomach cancer Neg Hx   ? ? ?Mr. Markoff is {aware/unaware} of previous family history of genetic testing for hereditary cancer risks. Patient's maternal ancestors are of *** descent, and paternal ancestors are of *** descent. There is no reported Ashkenazi Jewish ancestry. There is no known consanguinity. ? ?GENETIC COUNSELING ASSESSMENT: Mr. Barcellos is a 81 y.o. male with a personal history of prostate cancer which is somewhat suggestive of a hereditary cancer syndrome and predisposition to cancer. We, therefore, discussed and recommended the following at today's visit.  ? ?DISCUSSION: We discussed that approximately 10% of prostate cancer is hereditary. Most cases of hereditary prostate cancer are associated with BRCA1/BRCA2 genes, although there are other genes associated with hereditary cancer as well. Cancers and risks are gene specific.  We discussed that testing is beneficial for several reasons including knowing about other cancer risks, identifying potential screening and risk-reduction options that may be appropriate, and to understand if other family members could be at risk for cancer and allow them to undergo genetic  testing.  ? ?We reviewed the characteristics, features and inheritance patterns of hereditary cancer syndromes. We also discussed genetic testing, including the appropriate family members to test, the process of te

## 2022-03-24 DIAGNOSIS — R69 Illness, unspecified: Secondary | ICD-10-CM | POA: Diagnosis not present

## 2022-03-27 ENCOUNTER — Ambulatory Visit (INDEPENDENT_AMBULATORY_CARE_PROVIDER_SITE_OTHER): Payer: No Typology Code available for payment source | Admitting: Family Medicine

## 2022-03-27 ENCOUNTER — Ambulatory Visit (INDEPENDENT_AMBULATORY_CARE_PROVIDER_SITE_OTHER): Payer: No Typology Code available for payment source

## 2022-03-27 ENCOUNTER — Encounter: Payer: Self-pay | Admitting: Family Medicine

## 2022-03-27 VITALS — BP 137/72 | HR 119 | Ht 65.0 in | Wt 148.0 lb

## 2022-03-27 DIAGNOSIS — J189 Pneumonia, unspecified organism: Secondary | ICD-10-CM

## 2022-03-27 DIAGNOSIS — Z7901 Long term (current) use of anticoagulants: Secondary | ICD-10-CM

## 2022-03-27 DIAGNOSIS — Z86718 Personal history of other venous thrombosis and embolism: Secondary | ICD-10-CM

## 2022-03-27 DIAGNOSIS — I480 Paroxysmal atrial fibrillation: Secondary | ICD-10-CM | POA: Diagnosis not present

## 2022-03-27 DIAGNOSIS — D6859 Other primary thrombophilia: Secondary | ICD-10-CM

## 2022-03-27 DIAGNOSIS — A419 Sepsis, unspecified organism: Secondary | ICD-10-CM

## 2022-03-27 DIAGNOSIS — R0602 Shortness of breath: Secondary | ICD-10-CM | POA: Diagnosis not present

## 2022-03-27 LAB — COAGUCHEK XS/INR WAIVED
INR: 1.4 — ABNORMAL HIGH (ref 0.9–1.1)
Prothrombin Time: 17.3 s

## 2022-03-27 NOTE — Progress Notes (Signed)
? ?BP 137/72   Pulse (!) 119   Ht 5' 5" (1.651 m)   Wt 148 lb (67.1 kg)   SpO2 96%   BMI 24.63 kg/m?   ? ?Subjective:  ? ?Patient ID: Alan [name]., male    DOB: Apr 06, 1941, 81 y.o.   MRN: [id number] ? ?HPI: ?Alan [name]. is a 81 y.o. male presenting on 03/27/2022 for Medical Management of Chronic Issues, Atrial Fibrillation, and Shortness of Breath ? ? ?HPI ?Hospital follow-up for pneumonia and shortness of breath ?Patient is coming in today for hospital follow-up for pneumonia and shortness of breath.  He was in the hospital with pneumonia and sepsis and shortness of breath and not feeling well and had fever of 104 and was diagnosed with severe sepsis secondary to pneumonia.  He was admitted on 03/12/2022 and discharged on 03/14/2022.  He says and his wife says that he still is somewhat short of breath since leaving the hospital although it is improving but very slowly.  He says that he is still very short of breath up and moving around.  He denies any significant cough or wheezing.  He denies any fevers or chills since leaving the hospital.  He did just finished his antibiotic 2 days ago.  He was sent home with a 5-day course of Levaquin. ? ?Coumadin recheck ?Target goal: 2.0-3.0 ?Reason on anticoagulation: A-fib with history of DVTs ?Patient denies any bruising or bleeding or chest pain or palpitations  ? ?Relevant past medical, surgical, family and social history reviewed and updated as indicated. Interim medical history since our last visit reviewed. ?Allergies and medications reviewed and updated. ? ?Review of Systems  ?Constitutional:  Negative for chills and fever.  ?HENT:  Positive for congestion.   ?Eyes:  Negative for visual disturbance.  ?Respiratory:  Positive for shortness of breath. Negative for cough, chest tightness and wheezing.   ?Cardiovascular:  Negative for chest pain, palpitations and leg swelling.  ?Musculoskeletal:  Negative for back pain and gait problem.  ?Skin:  Negative for rash.   ?Neurological:  Negative for dizziness, weakness and light-headedness.  ?All other systems reviewed and are negative. ? ?Per HPI unless specifically indicated above ? ? ?Allergies as of 03/27/2022   ? ?   Reactions  ? Allopurinol Other (See Comments)  ? Significantly decreased WBC's  ? Colchicine Other (See Comments)  ? Significantly decreased WBC's  ? ?  ? ?  ?Medication List  ?  ? ?  ? Accurate as of March 27, 2022 10:17 AM. If you have any questions, ask your nurse or doctor.  ?  ?  ? ?  ? ?acetaminophen 500 MG tablet ?Commonly known as: TYLENOL ?Take 1,000 mg by mouth every 6 (six) hours as needed for moderate pain. ?  ?amLODipine 10 MG tablet ?Commonly known as: NORVASC ?Take 1 tablet (10 mg total) by mouth every evening. ?  ?CALCIUM 600 + D PO ?Take 1 tablet by mouth in the morning and at bedtime. ?  ?calcium carbonate 1500 (600 Ca) MG Tabs tablet ?Commonly known as: OSCAL ?Take by mouth 2 (two) times daily with a meal. ?  ?chlorthalidone 25 MG tablet ?Commonly known as: HYGROTON ?Take 1 tablet (25 mg total) by mouth daily. ?  ?diclofenac Sodium 1 % Gel ?Commonly known as: VOLTAREN ?Apply 1 application topically 2 (two) times daily as needed (pain). ?  ?Erleada 60 MG tablet ?Generic drug: apalutamide ?Take 4 tablets (240 mg total) by mouth daily. Take as directed. ?What  changed: how much to take ?  ?FeroSul 325 (65 FE) MG tablet ?Generic drug: ferrous sulfate ?Take 1 tablet by mouth once daily with breakfast ?What changed: how much to take ?  ?fluticasone 50 MCG/ACT nasal spray ?Commonly known as: FLONASE ?Place 1 spray into both nostrils 2 (two) times daily as needed for allergies or rhinitis. ?  ?glucose blood test strip ?Commonly known as: ONE TOUCH ULTRA TEST ?Use to check BG once daily.  Dx:  Type 2 DM controlled E11.9 ?  ?HYDROcodone-acetaminophen 5-325 MG tablet ?Commonly known as: NORCO/VICODIN ?Take 1 tablet by mouth every 6 (six) hours as needed for moderate pain. Can take every 4 hours if needed. ?   ?lisinopril 20 MG tablet ?Commonly known as: ZESTRIL ?Take 1.5 tablets (30 mg total) by mouth daily. ?  ?metFORMIN 1000 MG tablet ?Commonly known as: GLUCOPHAGE ?Take 1 tablet (1,000 mg total) by mouth 2 (two) times daily with a meal. ?  ?metoprolol succinate 100 MG 24 hr tablet ?Commonly known as: TOPROL-XL ?Take 1 tablet (100 mg total) by mouth daily. Take with or immediately following a meal. ?  ?omeprazole 20 MG capsule ?Commonly known as: PRILOSEC ?Take 1 capsule (20 mg total) by mouth daily. ?  ?ONE TOUCH ULTRA MINI w/Device Kit ?1 each by Does not apply route 2 (two) times daily. ?  ?potassium chloride SA 20 MEQ tablet ?Commonly known as: KLOR-CON M ?TAKE 1  BY MOUTH ONCE DAILY ?What changed: See the new instructions. ?  ?pravastatin 40 MG tablet ?Commonly known as: PRAVACHOL ?Take 1 tablet (40 mg total) by mouth every evening. ?  ?warfarin 5 MG tablet ?Commonly known as: COUMADIN ?Take as directed by the anticoagulation clinic. If you are unsure how to take this medication, talk to your nurse or doctor. ?Original instructions: TAKE 1 & 1/2 (ONE & ONE-HALF) TABLETS BY MOUTH ONCE DAILY 6 IN THE EVENING ?What changed:  ?how much to take ?how to take this ?when to take this ?additional instructions ?  ? ?  ? ? ? ?Objective:  ? ?BP 137/72   Pulse (!) 119   Ht 5' 5" (1.651 m)   Wt 148 lb (67.1 kg)   SpO2 96%   BMI 24.63 kg/m?   ?Wt Readings from Last 3 Encounters:  ?03/27/22 148 lb (67.1 kg)  ?03/22/22 150 lb 12.8 oz (68.4 kg)  ?03/21/22 149 lb 6.4 oz (67.8 kg)  ?  ?Physical Exam ?Vitals and nursing note reviewed.  ?Constitutional:   ?   General: He is not in acute distress. ?   Appearance: He is well-developed. He is not diaphoretic.  ?Eyes:  ?   General: No scleral icterus. ?   Conjunctiva/sclera: Conjunctivae normal.  ?Neck:  ?   Thyroid: No thyromegaly.  ?Cardiovascular:  ?   Rate and Rhythm: Normal rate. Rhythm irregular.  ?   Heart sounds: Normal heart sounds.  ?Pulmonary:  ?   Effort: Pulmonary effort  is normal. No respiratory distress.  ?   Breath sounds: Normal breath sounds. No stridor. No wheezing, rhonchi or rales.  ?Musculoskeletal:     ?   General: Swelling (Trace peripheral edema bilaterally) present. Normal range of motion.  ?   Cervical back: Neck supple.  ?Lymphadenopathy:  ?   Cervical: No cervical adenopathy.  ?Skin: ?   General: Skin is warm and dry.  ?   Findings: No rash.  ?Neurological:  ?   Mental Status: He is alert and oriented to person, place, and time.  ?  Coordination: Coordination normal.  ?Psychiatric:     ?   Behavior: Behavior normal.  ? ? ? ? ?Assessment & Plan:  ? ?Problem List Items Addressed This Visit   ? ?  ? Cardiovascular and Mediastinum  ? Paroxysmal atrial fibrillation (HCC)  ? Relevant Orders  ? CBC with Differential/Platelet  ? CMP14+EGFR  ?  ? Hematopoietic and Hemostatic  ? Primary hypercoagulable state ([place]) [D68.59]  ?  ? Other  ? Long term (current) use of anticoagulants [Z79.01]  ? Relevant Orders  ? CoaguChek XS/INR Waived  ? History of DVT (deep vein thrombosis)  ? ?Other Visit Diagnoses   ? ? Community acquired pneumonia of left lower lobe of lung    -  Primary  ? Relevant Orders  ? CBC with Differential/Platelet  ? CMP14+EGFR  ? Sepsis, due to unspecified organism, unspecified whether acute organ dysfunction present [facility])      ? Relevant Orders  ? CBC with Differential/Platelet  ? CMP14+EGFR  ? ?  ?  ?We will do chest x-ray and blood work on the way out and reevaluate from there, likely will just take some time for his breathing and energy to come back but will determine based on chest x-ray.. ? ?Description   ?Recent antibiotics, seems to affected the dosing.  Take extra half a tablet today or 2 whole tablets and then return to take  1 1/2 tablets every day, this is what he was put on in the hospital ? ?INR was 1.4 (goal 2-3)  ? ?Follow-up 1-2 weeks ?  ? ? ?Follow up plan: ?Return if symptoms worsen or fail to improve, for 1-2-week repeat INR. ? ?Counseling  provided for all of the vaccine components ?Orders Placed This Encounter  ?Procedures  ? CoaguChek XS/INR Waived  ? CBC with Differential/Platelet  ? CMP14+EGFR  ? ? ?Caryl Pina, MD ?[facility]

## 2022-03-27 NOTE — Addendum Note (Signed)
Addended by: Lanier Prude D on: 03/27/2022 10:37 AM ? ? Modules accepted: Orders ? ?

## 2022-03-28 LAB — CMP14+EGFR
ALT: 8 IU/L (ref 0–44)
AST: 15 IU/L (ref 0–40)
Albumin/Globulin Ratio: 1.3 (ref 1.2–2.2)
Albumin: 3.9 g/dL (ref 3.7–4.7)
Alkaline Phosphatase: 134 IU/L — ABNORMAL HIGH (ref 44–121)
BUN/Creatinine Ratio: 21 (ref 10–24)
BUN: 23 mg/dL (ref 8–27)
Bilirubin Total: 0.3 mg/dL (ref 0.0–1.2)
CO2: 21 mmol/L (ref 20–29)
Calcium: 10.1 mg/dL (ref 8.6–10.2)
Chloride: 97 mmol/L (ref 96–106)
Creatinine, Ser: 1.08 mg/dL (ref 0.76–1.27)
Globulin, Total: 2.9 g/dL (ref 1.5–4.5)
Glucose: 147 mg/dL — ABNORMAL HIGH (ref 70–99)
Potassium: 4.3 mmol/L (ref 3.5–5.2)
Sodium: 140 mmol/L (ref 134–144)
Total Protein: 6.8 g/dL (ref 6.0–8.5)
eGFR: 70 mL/min/{1.73_m2} (ref >59)

## 2022-03-28 LAB — CBC WITH DIFFERENTIAL/PLATELET
Basophils Absolute: 0 10*3/uL (ref 0.0–0.2)
Basos: 0 %
EOS (ABSOLUTE): 0 10*3/uL (ref 0.0–0.4)
Eos: 1 %
Hematocrit: 31.7 % — ABNORMAL LOW (ref 37.5–51.0)
Hemoglobin: 10.4 g/dL — ABNORMAL LOW (ref 13.0–17.7)
Immature Grans (Abs): 0 10*3/uL (ref 0.0–0.1)
Immature Granulocytes: 0 %
Lymphocytes Absolute: 0.8 10*3/uL (ref 0.7–3.1)
Lymphs: 14 %
MCH: 27.4 pg (ref 26.6–33.0)
MCHC: 32.8 g/dL (ref 31.5–35.7)
MCV: 84 fL (ref 79–97)
Monocytes Absolute: 0.4 10*3/uL (ref 0.1–0.9)
Monocytes: 8 %
Neutrophils Absolute: 4.1 10*3/uL (ref 1.4–7.0)
Neutrophils: 77 %
Platelets: 430 10*3/uL (ref 150–450)
RBC: 3.79 x10E6/uL — ABNORMAL LOW (ref 4.14–5.80)
RDW: 14 % (ref 11.6–15.4)
WBC: 5.4 10*3/uL (ref 3.4–10.8)

## 2022-03-31 ENCOUNTER — Encounter: Payer: Self-pay | Admitting: Licensed Clinical Social Worker

## 2022-03-31 ENCOUNTER — Telehealth: Payer: Self-pay | Admitting: Licensed Clinical Social Worker

## 2022-03-31 DIAGNOSIS — Z1379 Encounter for other screening for genetic and chromosomal anomalies: Secondary | ICD-10-CM | POA: Insufficient documentation

## 2022-03-31 NOTE — Telephone Encounter (Signed)
Pt no showed for genetic counseling appointment. Genetic testing had already been ordered. Testing reported out as negative on 03/30/2022 and results disclosed to Dr. Delton Coombes.  ?

## 2022-04-06 ENCOUNTER — Telehealth: Payer: No Typology Code available for payment source

## 2022-04-10 ENCOUNTER — Inpatient Hospital Stay (HOSPITAL_COMMUNITY): Payer: No Typology Code available for payment source

## 2022-04-10 ENCOUNTER — Inpatient Hospital Stay (HOSPITAL_COMMUNITY): Payer: No Typology Code available for payment source | Attending: Hematology | Admitting: Hematology

## 2022-04-10 ENCOUNTER — Ambulatory Visit: Payer: No Typology Code available for payment source | Admitting: Family Medicine

## 2022-04-10 ENCOUNTER — Encounter (HOSPITAL_COMMUNITY): Payer: Self-pay | Admitting: Hematology

## 2022-04-10 VITALS — BP 154/54 | HR 45 | Temp 99.4°F | Resp 18 | Ht 64.37 in | Wt 143.7 lb

## 2022-04-10 DIAGNOSIS — Z803 Family history of malignant neoplasm of breast: Secondary | ICD-10-CM | POA: Insufficient documentation

## 2022-04-10 DIAGNOSIS — Z808 Family history of malignant neoplasm of other organs or systems: Secondary | ICD-10-CM | POA: Insufficient documentation

## 2022-04-10 DIAGNOSIS — M25572 Pain in left ankle and joints of left foot: Secondary | ICD-10-CM | POA: Diagnosis not present

## 2022-04-10 DIAGNOSIS — C7951 Secondary malignant neoplasm of bone: Secondary | ICD-10-CM | POA: Insufficient documentation

## 2022-04-10 DIAGNOSIS — I480 Paroxysmal atrial fibrillation: Secondary | ICD-10-CM | POA: Insufficient documentation

## 2022-04-10 DIAGNOSIS — D649 Anemia, unspecified: Secondary | ICD-10-CM | POA: Insufficient documentation

## 2022-04-10 DIAGNOSIS — Z823 Family history of stroke: Secondary | ICD-10-CM | POA: Diagnosis not present

## 2022-04-10 DIAGNOSIS — R69 Illness, unspecified: Secondary | ICD-10-CM | POA: Diagnosis not present

## 2022-04-10 DIAGNOSIS — Z8249 Family history of ischemic heart disease and other diseases of the circulatory system: Secondary | ICD-10-CM | POA: Insufficient documentation

## 2022-04-10 DIAGNOSIS — R197 Diarrhea, unspecified: Secondary | ICD-10-CM | POA: Diagnosis not present

## 2022-04-10 DIAGNOSIS — R109 Unspecified abdominal pain: Secondary | ICD-10-CM | POA: Diagnosis not present

## 2022-04-10 DIAGNOSIS — R0602 Shortness of breath: Secondary | ICD-10-CM | POA: Insufficient documentation

## 2022-04-10 DIAGNOSIS — C61 Malignant neoplasm of prostate: Secondary | ICD-10-CM

## 2022-04-10 DIAGNOSIS — R5383 Other fatigue: Secondary | ICD-10-CM | POA: Diagnosis not present

## 2022-04-10 DIAGNOSIS — M2559 Pain in other specified joint: Secondary | ICD-10-CM | POA: Diagnosis not present

## 2022-04-10 DIAGNOSIS — Z87891 Personal history of nicotine dependence: Secondary | ICD-10-CM | POA: Insufficient documentation

## 2022-04-10 DIAGNOSIS — Z818 Family history of other mental and behavioral disorders: Secondary | ICD-10-CM | POA: Insufficient documentation

## 2022-04-10 DIAGNOSIS — I10 Essential (primary) hypertension: Secondary | ICD-10-CM | POA: Diagnosis not present

## 2022-04-10 DIAGNOSIS — E119 Type 2 diabetes mellitus without complications: Secondary | ICD-10-CM | POA: Insufficient documentation

## 2022-04-10 DIAGNOSIS — Z79899 Other long term (current) drug therapy: Secondary | ICD-10-CM | POA: Insufficient documentation

## 2022-04-10 LAB — CBC WITH DIFFERENTIAL/PLATELET
Abs Immature Granulocytes: 0.01 10*3/uL (ref 0.00–0.07)
Basophils Absolute: 0 10*3/uL (ref 0.0–0.1)
Basophils Relative: 0 %
Eosinophils Absolute: 0 10*3/uL (ref 0.0–0.5)
Eosinophils Relative: 1 %
HCT: 31.2 % — ABNORMAL LOW (ref 39.0–52.0)
Hemoglobin: 10.1 g/dL — ABNORMAL LOW (ref 13.0–17.0)
Immature Granulocytes: 0 %
Lymphocytes Relative: 18 %
Lymphs Abs: 0.9 10*3/uL (ref 0.7–4.0)
MCH: 28.1 pg (ref 26.0–34.0)
MCHC: 32.4 g/dL (ref 30.0–36.0)
MCV: 86.7 fL (ref 80.0–100.0)
Monocytes Absolute: 0.7 10*3/uL (ref 0.1–1.0)
Monocytes Relative: 14 %
Neutro Abs: 3.2 10*3/uL (ref 1.7–7.7)
Neutrophils Relative %: 67 %
Platelets: 260 10*3/uL (ref 150–400)
RBC: 3.6 MIL/uL — ABNORMAL LOW (ref 4.22–5.81)
RDW: 15.4 % (ref 11.5–15.5)
WBC: 4.8 10*3/uL (ref 4.0–10.5)
nRBC: 0 % (ref 0.0–0.2)

## 2022-04-10 LAB — COMPREHENSIVE METABOLIC PANEL
ALT: 11 U/L (ref 0–44)
AST: 22 U/L (ref 15–41)
Albumin: 3.5 g/dL (ref 3.5–5.0)
Alkaline Phosphatase: 80 U/L (ref 38–126)
Anion gap: 13 (ref 5–15)
BUN: 21 mg/dL (ref 8–23)
CO2: 24 mmol/L (ref 22–32)
Calcium: 9.5 mg/dL (ref 8.9–10.3)
Chloride: 100 mmol/L (ref 98–111)
Creatinine, Ser: 1.14 mg/dL (ref 0.61–1.24)
GFR, Estimated: >60 mL/min (ref >60)
Glucose, Bld: 143 mg/dL — ABNORMAL HIGH (ref 70–99)
Potassium: 4 mmol/L (ref 3.5–5.1)
Sodium: 137 mmol/L (ref 135–145)
Total Bilirubin: 0.4 mg/dL (ref 0.3–1.2)
Total Protein: 7.4 g/dL (ref 6.5–8.1)

## 2022-04-10 LAB — FERRITIN: Ferritin: 11 ng/mL — ABNORMAL LOW (ref 24–336)

## 2022-04-10 LAB — IRON AND TIBC
Iron: 30 ug/dL — ABNORMAL LOW (ref 45–182)
Saturation Ratios: 8 % — ABNORMAL LOW (ref 17.9–39.5)
TIBC: 360 ug/dL (ref 250–450)
UIBC: 330 ug/dL

## 2022-04-10 LAB — FOLATE: Folate: 6.9 ng/mL (ref >5.9)

## 2022-04-10 LAB — MAGNESIUM: Magnesium: 1.4 mg/dL — ABNORMAL LOW (ref 1.7–2.4)

## 2022-04-10 LAB — VITAMIN B12: Vitamin B-12: 382 pg/mL (ref 180–914)

## 2022-04-10 MED ORDER — DENOSUMAB 120 MG/1.7ML ~~LOC~~ SOLN
120.0000 mg | Freq: Once | SUBCUTANEOUS | Status: AC
  Administered 2022-04-10: 120 mg via SUBCUTANEOUS
  Filled 2022-04-10: qty 1.7

## 2022-04-10 MED ORDER — MAGNESIUM OXIDE -MG SUPPLEMENT 400 (240 MG) MG PO TABS: 400.0000 mg | ORAL_TABLET | Freq: Every day | ORAL | 6 refills | Status: DC

## 2022-04-10 MED ORDER — METHYLPREDNISOLONE 4 MG PO TBPK: ORAL_TABLET | ORAL | 0 refills | Status: DC

## 2022-04-10 NOTE — Progress Notes (Signed)
Patient is taking Erleada as prescribed.  He has not missed any doses and reports no side effects at this time.   

## 2022-04-10 NOTE — Patient Instructions (Signed)
[facility]  Discharge Instructions: Thank you for choosing [facility] to provide your oncology and hematology care.  If you have a lab appointment with the Cancer Center, please come in thru the Main Entrance and check in at the main information desk.  Wear comfortable clothing and clothing appropriate for easy access to any Portacath or PICC line.   We strive to give you quality time with your provider. You may need to reschedule your appointment if you arrive late (15 or more minutes).  Arriving late affects you and other patients whose appointments are after yours.  Also, if you miss three or more appointments without notifying the office, you may be dismissed from the clinic at the provider's discretion.      For prescription refill requests, have your pharmacy contact our office and allow 72 hours for refills to be completed.    Today you received the following chemotherapy and/or immunotherapy agents Xgeva      To help prevent nausea and vomiting after your treatment, we encourage you to take your nausea medication as directed.  BELOW ARE SYMPTOMS THAT SHOULD BE REPORTED IMMEDIATELY: *FEVER GREATER THAN 100.4 F (38 C) OR HIGHER *CHILLS OR SWEATING *NAUSEA AND VOMITING THAT IS NOT CONTROLLED WITH YOUR NAUSEA MEDICATION *UNUSUAL SHORTNESS OF BREATH *UNUSUAL BRUISING OR BLEEDING *URINARY PROBLEMS (pain or burning when urinating, or frequent urination) *BOWEL PROBLEMS (unusual diarrhea, constipation, pain near the anus) TENDERNESS IN MOUTH AND THROAT WITH OR WITHOUT PRESENCE OF ULCERS (sore throat, sores in mouth, or a toothache) UNUSUAL RASH, SWELLING OR PAIN  UNUSUAL VAGINAL DISCHARGE OR ITCHING   Items with * indicate a potential emergency and should be followed up as soon as possible or go to the Emergency Department if any problems should occur.  Please show the CHEMOTHERAPY ALERT CARD or IMMUNOTHERAPY ALERT CARD at check-in to the Emergency Department  and triage nurse.  Should you have questions after your visit or need to cancel or reschedule your appointment, please contact [facility] [phone]  and follow the prompts.  Office hours are 8:00 a.m. to 4:30 p.m. Monday - Friday. Please note that voicemails left after 4:00 p.m. may not be returned until the following business day.  We are closed weekends and major holidays. You have access to a nurse at all times for urgent questions. Please call the main number to the clinic [phone] and follow the prompts.  For any non-urgent questions, you may also contact your provider using MyChart. We now offer e-Visits for anyone 18 and older to request care online for non-urgent symptoms. For details visit [email or web address].   Also download the MyChart app! Go to the app store, search "MyChart", open the app, select [facility], and log in with your MyChart username and password.  Due to Covid, a mask is required upon entering the hospital/clinic. If you do not have a mask, one will be given to you upon arrival. For doctor visits, patients may have 1 support person aged 18 or older with them. For treatment visits, patients cannot have anyone with them due to current Covid guidelines and our immunocompromised population.  

## 2022-04-10 NOTE — Progress Notes (Deleted)
Patient collected urine at home, as requested 2 days ago, however left it at home.  Supplied with a new specimen cup.  Dr. Katragadda aware ?

## 2022-04-10 NOTE — Progress Notes (Signed)
Alan [name]. presents today for Xgeva injection per the provider's orders.  Patient taking Calcium and Vitamin D supplements, has had no jaw pain and no dental work.  Stable during administration without incident; injection site WNL; see MAR for injection details.  Patient tolerated procedure well and without incident.  No questions or complaints noted at this time.  ?

## 2022-04-10 NOTE — Progress Notes (Signed)
? ?[facility] ?[address] ?[place], [place] [address] ? ? ?CLINIC:  ?Medical Oncology/Hematology ? ?PCP:  ?Dettinger, Fransisca Kaufmann, MD ?[place] / [place] [address] ?[phone] ? ? 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 ?PRIOR THERAPY: none ? ?NGS Results: not done ? ?CURRENT THERAPY: surveillance ? ?BRIEF ONCOLOGIC HISTORY:  ?Oncology History  ?Prostate cancer ([place])  ?04/13/2014 Initial Diagnosis  ? Prostate cancer ([place]) ? ?  ?06/25/2020 Cancer Staging  ? Staging form: Prostate, AJCC 8th Edition ?- Clinical stage from 06/25/2020: Stage IIC (cT2b, cN0, cM0, PSA: 12.8, Grade Group: 3) - Signed by Freeman Caldron, PA-C on 09/29/2020 ? ?  ? ? ?CANCER STAGING: ?Cancer Staging  ?Prostate cancer ([place]) ?Staging form: Prostate, AJCC 8th Edition ?- Clinical stage from 06/25/2020: Stage IIC (cT2b, cN0, cM0, PSA: 12.8, Grade Group: 3) - Signed by Freeman Caldron, PA-C on 09/29/2020 ?- Pathologic stage from 01/26/2022: Stage IVB (pT2, pN0, pM1b, Grade Group: 3) - Unsigned ? ? ?INTERVAL HISTORY:  ?Mr. [name], a 81 y.o. 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 Juergen was last seen on 03/21/2022.  ? ?Today he reports feeling well. He reports water diarrhea occurring 2-3 times daily, and one day he had 5 episodes of diarrhea at night. He reports increased SOB. He reports red spots appearing on his face, legs, and trunk which itch occasionally. He denies recent falls.  ? ?REVIEW OF SYSTEMS:  ?Review of Systems  ?Constitutional:  Positive for fatigue. Negative for appetite change.  ?Respiratory:  Positive for shortness of breath.   ?Gastrointestinal:  Positive for abdominal pain and diarrhea.  ?Musculoskeletal:  Positive for arthralgias (L leg and foot).  ?Skin:  Positive for itching and rash.  ?Neurological:  Positive for dizziness.  ?All other systems reviewed and are negative. ? ?PAST MEDICAL/SURGICAL HISTORY:  ?Past Medical History:  ?Diagnosis Date  ?  AKI (acute kidney injury) ([place]) 09/22/2019  ? Anticoagulated on Coumadin   ? managed by pcp  ? Arthritis   ? BPH with urinary obstruction   ? Diverticulosis of colon   ? ED (erectile dysfunction)   ? 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 found during hospital stay with pancreatitis,  started coumadin;   last Abd CT in epic 07-29-2020  chronic thrombus portal venous  ? GERD (gastroesophageal reflux disease)   ? Heart murmur   ? History of acute pancreatitis   ? 2016   necrotizing pancreatitis  ? History of adenomatous polyp of colon   ? History of DVT of lower extremity   ? History of GI bleed 08/2019  ? upper gi bleed due to duodenal ulcer  ? History of gout yrs ago  ? Hypertension   ? followed by pcp  ? Hypertrophic cardiomyopathy ([place])   ? followed by cardiology  ? Internal hemorrhoids   ? Mixed hyperlipidemia   ? Normocytic anemia   ? PAF (paroxysmal atrial fibrillation) ([place]) 04/2016  ? cardiologist-- dr h. Tamala Julian--  event monitor 07-04-2017 epic, NSR/ PAflutter with occasional RVR/  PACs/ PVCs ;  echo 05-16-2017 epic,  moderate LVH with severe asymptomic septal hypertrophy, G1DD, EF 60-65%,  mild AR, mild LAE  ? Pancreatic pseudocyst   ? Peyronie's disease   ? Prostate cancer [facility]) urologist-- dr wrenn/ oncologist-- dr Tammi Klippel  ? first dx 08/ 2009 Gleason 3+3 active survelliance;  until bx 07/ 2021  Stage T2b, Gleason 4+3  ?  Right inguinal hernia   ? Type 2 diabetes mellitus ([place])   ? followed by pcp---  (11-03-2020 per pt currently not checking blood sugar due to glucose monitor broken)  ? Wears glasses   ? Wears partial dentures   ? upper and lower  ? ?Past Surgical History:  ?Procedure Laterality Date  ? BIOPSY  02/09/2022  ? Procedure: BIOPSY;  Surgeon: Milus Banister, MD;  Location: [facility];  Service: Endoscopy;;  ? CHOLECYSTECTOMY N/A 11/26/2015  ? Procedure: LAPAROSCOPIC CHOLECYSTECTOMY;  Surgeon: Aviva Signs, MD;  Location: [facility];  Service: General;  Laterality: N/A;  ? COLONOSCOPY   last one 09-06-2020  dr stark  ? CYSTOSCOPY N/A 11/09/2020  ? Procedure: CYSTOSCOPY FLEXIBLE;  Surgeon: Irine Seal, MD;  Location: [facility];  Service: Urology;  Laterality: N/A;  ? ESOPHAGOGASTRODUODENOSCOPY  10-06-2019  '@[facility]'$   ? ESOPHAGOGASTRODUODENOSCOPY N/A 02/09/2022  ? Procedure: ESOPHAGOGASTRODUODENOSCOPY (EGD);  Surgeon: Milus Banister, MD;  Location: [facility];  Service: Endoscopy;  Laterality: N/A;  ? EUS N/A 02/09/2022  ? Procedure: UPPER ENDOSCOPIC ULTRASOUND (EUS) RADIAL;  Surgeon: Milus Banister, MD;  Location: [facility];  Service: Endoscopy;  Laterality: N/A;  ? FLEXIBLE BRONCHOSCOPY Bilateral 04/14/2016  ? Procedure: FLEXIBLE BRONCHOSCOPY;  Surgeon: Sinda Du, MD;  [facility];  Service: Cardiopulmonary;  Laterality: Bilateral;  ? HEMORRHOID SURGERY  yrs ago  ? RADIOACTIVE SEED IMPLANT N/A 11/09/2020  ? Procedure: RADIOACTIVE SEED IMPLANT/BRACHYTHERAPY IMPLANT;  Surgeon: Irine Seal, MD;  Location: [facility];  Service: Urology;  Laterality: N/A;  ? SPACE OAR INSTILLATION N/A 11/09/2020  ? Procedure: SPACE OAR INSTILLATION;  Surgeon: Irine Seal, MD;  Location: [facility];  Service: Urology;  Laterality: N/A;  ? UMBILICAL HERNIA REPAIR N/A 11/26/2015  ? Procedure: UMBILICAL HERNIORRHAPHY;  Surgeon: Aviva Signs, MD;  Location: [facility];  Service: General;  Laterality: N/A;  ? ? ?SOCIAL HISTORY:  ?Social History  ? ?Socioeconomic History  ? Marital status: Married  ?  Spouse name: Arville Go  ? Number of children: 4  ? Years of education: Not on file  ? Highest education level: 10th grade  ?Occupational History  ? Occupation: Retired   ?  Employer: [employer]  ?Tobacco Use  ? Smoking status: Former  ?  Years: 5.00  ?  Types: Cigarettes  ?  Quit date: 12/11/1986  ?  Years since quitting: 35.3  ? Smokeless tobacco: Never  ?Vaping Use  ? Vaping Use: Never used  ?Substance and Sexual Activity  ? Alcohol use: No  ? Drug use: Never  ? Sexual  activity: Yes  ?Other Topics Concern  ? Not on file  ?Social History Narrative  ? Not on file  ? ?Social Determinants of Health  ? ?Financial Resource Strain: Low Risk   ? Difficulty of Paying Living Expenses: Not hard at all  ?Food Insecurity: No Food Insecurity  ? Worried About Charity fundraiser in the Last Year: Never true  ? Ran Out of Food in the Last Year: Never true  ?Transportation Needs: No Transportation Needs  ? Lack of Transportation (Medical): No  ? Lack of Transportation (Non-Medical): No  ?Physical Activity: Inactive  ? Days of Exercise per Week: 0 days  ? Minutes of Exercise per Session: 0 min  ?Stress: Stress Concern Present  ? Feeling of Stress : To some extent  ?Social Connections: Socially Integrated  ? Frequency of Communication with Friends and Family: Three times a week  ?  Frequency of Social Gatherings with Friends and Family: Three times a week  ? Attends Religious Services: More than 4 times per year  ? Active Member of Clubs or Organizations: Yes  ? Attends Archivist Meetings: More than 4 times per year  ? Marital Status: Married  ?Intimate Partner Violence: Not At Risk  ? Fear of Current or Ex-Partner: No  ? Emotionally Abused: No  ? Physically Abused: No  ? Sexually Abused: No  ? ? ?FAMILY HISTORY:  ?Family History  ?Problem Relation Age of Onset  ? Stroke Mother   ? Hypertension Mother   ? Alzheimer's disease Mother   ? Hypertension Father   ? Brain cancer Sister   ? Breast cancer Sister   ? Hypertension Sister   ? Deep vein thrombosis Brother   ? Hypertension Brother   ? Colon cancer Neg Hx   ? Pancreatic disease Neg Hx   ? Esophageal cancer Neg Hx   ? Rectal cancer Neg Hx   ? Stomach cancer Neg Hx   ? ? ?CURRENT MEDICATIONS:  ?Current Outpatient Medications  ?Medication Sig Dispense Refill  ? acetaminophen (TYLENOL) 500 MG tablet Take 1,000 mg by mouth every 6 (six) hours as needed for moderate pain.    ? amLODipine (NORVASC) 10 MG tablet Take 1 tablet (10 mg total) by  mouth every evening. 90 tablet 3  ? apalutamide (ERLEADA) 60 MG tablet Take 4 tablets (240 mg total) by mouth daily. Take as directed. (Patient taking differently: Take 180 mg by mouth daily. Take as directe

## 2022-04-10 NOTE — Patient Instructions (Addendum)
[facility] ?Discharge Instructions ? ? ?You were seen and examined today by Dr. Delton Coombes. ? ?He reviewed the results of your lab work.  Your magnesium is low. We will send a prescription for magnesium to your pharmacy. Take 3 times a day as prescribed.  ? ?Decrease the dose of the Erleada (chemo pill) to 3 tablets per day.  ? ?Return as scheduled in 4 weeks.  ? ? ?Thank you for choosing [facility] at [facility] to provide your oncology and hematology care.  To afford each patient quality time with our provider, please arrive at least 15 minutes before your scheduled appointment time.  ? ?If you have a lab appointment with the [facility] please come in thru the Main Entrance and check in at the main information desk. ? ?You need to re-schedule your appointment should you arrive 10 or more minutes late.  We strive to give you quality time with our providers, and arriving late affects you and other patients whose appointments are after yours.  Also, if you no show three or more times for appointments you may be dismissed from the clinic at the providers discretion.     ?Again, thank you for choosing [facility].  Our hope is that these requests will decrease the amount of time that you wait before being seen by our physicians.       ?_____________________________________________________________ ? ?Should you have questions after your visit to [facility], please contact our office at [phone] and follow the prompts.  Our office hours are 8:00 a.m. and 4:30 p.m. Monday - Friday.  Please note that voicemails left after 4:00 p.m. may not be returned until the following business day.  We are closed weekends and major holidays.  You do have access to a nurse 24-7, just call the main number to the clinic [phone] and do not press any options, hold on the line and a nurse will answer the phone.   ? ?For prescription refill  requests, have your pharmacy contact our office and allow 72 hours.   ? ?Due to Covid, you will need to wear a mask upon entering the hospital. If you do not have a mask, a mask will be given to you at the Main Entrance upon arrival. For doctor visits, patients may have 1 support person age 6 or older with them. For treatment visits, patients can not have anyone with them due to social distancing guidelines and our immunocompromised population.  ? ?   ?

## 2022-04-11 ENCOUNTER — Encounter: Payer: Self-pay | Admitting: Nurse Practitioner

## 2022-04-11 ENCOUNTER — Ambulatory Visit (INDEPENDENT_AMBULATORY_CARE_PROVIDER_SITE_OTHER): Payer: No Typology Code available for payment source | Admitting: Nurse Practitioner

## 2022-04-11 VITALS — BP 149/68 | HR 85 | Temp 97.9°F | Ht 65.0 in | Wt 144.6 lb

## 2022-04-11 DIAGNOSIS — I480 Paroxysmal atrial fibrillation: Secondary | ICD-10-CM

## 2022-04-11 DIAGNOSIS — Z7901 Long term (current) use of anticoagulants: Secondary | ICD-10-CM

## 2022-04-11 LAB — COAGUCHEK XS/INR WAIVED
INR: 2.5 — ABNORMAL HIGH (ref 0.9–1.1)
Prothrombin Time: 29.8 s

## 2022-04-11 NOTE — Patient Instructions (Signed)
Atrial Fibrillation  Atrial fibrillation is a type of heartbeat that is irregular or fast. If you have this condition, your heart beats without any order. This makes it hard for your heart to pump blood in a normal way. Atrial fibrillation may come and go, or it may become a long-lasting problem. If this condition is not treated, it can put you at higher risk for stroke, heart failure, and other heart problems. What are the causes? This condition may be caused by diseases that damage the heart. They include: High blood pressure. Heart failure. Heart valve disease. Heart surgery. Other causes include: Diabetes. Thyroid disease. Being overweight. Kidney disease. Sometimes the cause is not known. What increases the risk? You are more likely to develop this condition if: You are older. You smoke. You exercise often and very hard. You have a family history of this condition. You are a man. You use drugs. You drink a lot of alcohol. You have lung conditions, such as emphysema, pneumonia, or COPD. You have sleep apnea. What are the signs or symptoms? Common symptoms of this condition include: A feeling that your heart is beating very fast. Chest pain or discomfort. Feeling short of breath. Suddenly feeling light-headed or weak. Getting tired easily during activity. Fainting. Sweating. In some cases, there are no symptoms. How is this treated? Treatment for this condition depends on underlying conditions and how you feel when you have atrial fibrillation. They include: Medicines to: Prevent blood clots. Treat heart rate or heart rhythm problems. Using devices, such as a pacemaker, to correct heart rhythm problems. Doing surgery to remove the part of the heart that sends bad signals. Closing an area where clots can form in the heart (left atrial appendage). In some cases, your doctor will treat other underlying conditions. Follow these instructions at home: Medicines Take  over-the-counter and prescription medicines only as told by your doctor. Do not take any new medicines without first talking to your doctor. If you are taking blood thinners: Talk with your doctor before you take any medicines that have aspirin or NSAIDs, such as ibuprofen, in them. Take your medicine exactly as told by your doctor. Take it at the same time each day. Avoid activities that could hurt or bruise you. Follow instructions about how to prevent falls. Wear a bracelet that says you are taking blood thinners. Or, carry a card that lists what medicines you take. Lifestyle     Do not use any products that have nicotine or tobacco in them. These include cigarettes, e-cigarettes, and chewing tobacco. If you need help quitting, ask your doctor. Eat heart-healthy foods. Talk with your doctor about the right eating plan for you. Exercise regularly as told by your doctor. Do not drink alcohol. Lose weight if you are overweight. Do not use drugs, including cannabis. General instructions If you have a condition that causes breathing to stop for a short period of time (apnea), treat it as told by your doctor. Keep a healthy weight. Do not use diet pills unless your doctor says they are safe for you. Diet pills may make heart problems worse. Keep all follow-up visits as told by your doctor. This is important. Contact a doctor if: You notice a change in the speed, rhythm, or strength of your heartbeat. You are taking a blood-thinning medicine and you get more bruising. You get tired more easily when you move or exercise. You have a sudden change in weight. Get help right away if:  You have pain in   your chest or your belly (abdomen). You have trouble breathing. You have side effects of blood thinners, such as blood in your vomit, poop (stool), or pee (urine), or bleeding that cannot stop. You have any signs of a stroke. "BE FAST" is an easy way to remember the main warning signs: B -  Balance. Signs are dizziness, sudden trouble walking, or loss of balance. E - Eyes. Signs are trouble seeing or a change in how you see. F - Face. Signs are sudden weakness or loss of feeling in the face, or the face or eyelid drooping on one side. A - Arms. Signs are weakness or loss of feeling in an arm. This happens suddenly and usually on one side of the body. S - Speech. Signs are sudden trouble speaking, slurred speech, or trouble understanding what people say. T - Time. Time to call emergency services. Write down what time symptoms started. You have other signs of a stroke, such as: A sudden, very bad headache with no known cause. Feeling like you may vomit (nausea). Vomiting. A seizure. These symptoms may be an emergency. Do not wait to see if the symptoms will go away. Get medical help right away. Call your local emergency services (911 in the [place] Do not drive yourself to the hospital. Summary Atrial fibrillation is a type of heartbeat that is irregular or fast. You are at higher risk of this condition if you smoke, are older, have diabetes, or are overweight. Follow your doctor's instructions about medicines, diet, exercise, and follow-up visits. Get help right away if you have signs or symptoms of a stroke. Get help right away if you cannot catch your breath, or you have chest pain or discomfort. This information is not intended to replace advice given to you by your health care provider. Make sure you discuss any questions you have with your health care provider. Document Revised: 05/21/2019 Document Reviewed: 05/21/2019 Elsevier Patient Education  2023 Elsevier Inc.  

## 2022-04-11 NOTE — Assessment & Plan Note (Signed)
INR Today: 2.5 ?Current dose: 7.5 mg Monday , Wednesday, Friday,5 mg Tuesday, Thursday, Saturday.   ?Patient is taking differently as stated above from original order. ?

## 2022-04-11 NOTE — Progress Notes (Signed)
Subjective:  ?  ? Indication: atrial fibrillation ?Bleeding signs/symptoms: None ?Thromboembolic signs/symptoms: None ? ?Missed Coumadin doses: None ?Medication changes: no ?Dietary changes: no ?Bacterial/viral infection: no ?Other concerns: no ? ?The following portions of the patient's history were reviewed and updated as appropriate: allergies, current medications, past family history, past medical history, past social history, past surgical history, and problem list. ? ?Review of Systems ?Pertinent items are noted in HPI.  ? ?Objective:  ? ? INR Today: 2.5 ?Current dose: 7.5 mg Monday , Wednesday, Friday,5 mg Tuesday, Thursday, Saturday.   ?Patient is taking differently as stated above from original order. ?Assessment:  ? ? Therapeutic INR for goal of 2-3  ? ?Plan:  ? ? 1. New dose: no change   ?2. Next INR: 6 weeks  ?

## 2022-04-16 ENCOUNTER — Other Ambulatory Visit: Payer: Self-pay | Admitting: Interventional Cardiology

## 2022-04-20 ENCOUNTER — Ambulatory Visit (INDEPENDENT_AMBULATORY_CARE_PROVIDER_SITE_OTHER): Payer: No Typology Code available for payment source | Admitting: Urology

## 2022-04-20 DIAGNOSIS — C61 Malignant neoplasm of prostate: Secondary | ICD-10-CM | POA: Diagnosis not present

## 2022-04-20 MED ORDER — DEGARELIX ACETATE 80 MG ~~LOC~~ SOLR
80.0000 mg | Freq: Once | SUBCUTANEOUS | Status: AC
  Administered 2022-04-20: 80 mg via SUBCUTANEOUS

## 2022-04-20 NOTE — Progress Notes (Signed)
Firmagon Sub Q Injection ? ?Due to Prostate Cancer patient is present today for a Firmagon Injection.  ? ?Medication: Mills Koller (Degarelix)  ?Dose: '80mg'$  ?Location: left upper abdomen ? ? ?Patient tolerated well, no complications were noted ? ?Performed by: Granvel Proudfoot LPN ? ?Follow up: Keep next scheduled OV ?

## 2022-04-21 ENCOUNTER — Other Ambulatory Visit (HOSPITAL_COMMUNITY): Payer: Self-pay

## 2022-04-21 DIAGNOSIS — G893 Neoplasm related pain (acute) (chronic): Secondary | ICD-10-CM

## 2022-04-21 DIAGNOSIS — C7951 Secondary malignant neoplasm of bone: Secondary | ICD-10-CM

## 2022-04-21 MED ORDER — HYDROCODONE-ACETAMINOPHEN 5-325 MG PO TABS: 1.0000 | ORAL_TABLET | Freq: Four times a day (QID) | ORAL | 0 refills | Status: DC | PRN

## 2022-04-26 ENCOUNTER — Encounter: Payer: Self-pay | Admitting: *Deleted

## 2022-04-28 ENCOUNTER — Other Ambulatory Visit: Payer: Self-pay | Admitting: Family Medicine

## 2022-05-03 ENCOUNTER — Telehealth: Payer: Self-pay | Admitting: Family Medicine

## 2022-05-03 DIAGNOSIS — E1159 Type 2 diabetes mellitus with other circulatory complications: Secondary | ICD-10-CM

## 2022-05-03 MED ORDER — LISINOPRIL 40 MG PO TABS: 40.0000 mg | ORAL_TABLET | Freq: Every day | ORAL | 3 refills | Status: DC

## 2022-05-03 NOTE — Telephone Encounter (Signed)
Per med list he is taking 1.5 tablets daily. Per note he is taking 2 tablets daily. Please advise and send correct rx

## 2022-05-03 NOTE — Telephone Encounter (Signed)
Let them know that I went ahead and changed to lisinopril 40 mg tablets so they can just take 140 mg tablet daily and send a new prescription to the pharmacy.  Keep a close eye on his blood pressures especially when he is falling.  As much she can please make sure that he is using a cane or a walker to prevent falls.  If he continues to fall in the future he may want to go to physical therapy to get strength back

## 2022-05-04 MED ORDER — LISINOPRIL 40 MG PO TABS: 40.0000 mg | ORAL_TABLET | Freq: Every day | ORAL | 1 refills | Status: DC

## 2022-05-04 NOTE — Telephone Encounter (Signed)
Spoke with [employer]. She is aware to stay on '40mg'$  once per day.New Rx sent to [facility] in [place]. They will keep an eye on his BP and informed that PT may need to be ordered if pt keeps falling.  Wife stated that pt did fall yesterday and hit his head. He does not have nausea, vomiting or a HA. Pt is not disoriented. Wife will call back if she notices any changes.

## 2022-05-09 ENCOUNTER — Inpatient Hospital Stay (HOSPITAL_COMMUNITY): Payer: No Typology Code available for payment source

## 2022-05-09 DIAGNOSIS — C61 Malignant neoplasm of prostate: Secondary | ICD-10-CM

## 2022-05-09 LAB — CBC WITH DIFFERENTIAL/PLATELET
Abs Immature Granulocytes: 0.02 10*3/uL (ref 0.00–0.07)
Basophils Absolute: 0 10*3/uL (ref 0.0–0.1)
Basophils Relative: 0 %
Eosinophils Absolute: 0.1 10*3/uL (ref 0.0–0.5)
Eosinophils Relative: 2 %
HCT: 28.9 % — ABNORMAL LOW (ref 39.0–52.0)
Hemoglobin: 9.1 g/dL — ABNORMAL LOW (ref 13.0–17.0)
Immature Granulocytes: 0 %
Lymphocytes Relative: 17 %
Lymphs Abs: 1.1 10*3/uL (ref 0.7–4.0)
MCH: 28.3 pg (ref 26.0–34.0)
MCHC: 31.5 g/dL (ref 30.0–36.0)
MCV: 89.8 fL (ref 80.0–100.0)
Monocytes Absolute: 0.6 10*3/uL (ref 0.1–1.0)
Monocytes Relative: 9 %
Neutro Abs: 4.4 10*3/uL (ref 1.7–7.7)
Neutrophils Relative %: 72 %
Platelets: 257 10*3/uL (ref 150–400)
RBC: 3.22 MIL/uL — ABNORMAL LOW (ref 4.22–5.81)
RDW: 17.1 % — ABNORMAL HIGH (ref 11.5–15.5)
WBC: 6.2 10*3/uL (ref 4.0–10.5)
nRBC: 0 % (ref 0.0–0.2)

## 2022-05-09 LAB — PSA: Prostatic Specific Antigen: 3.26 ng/mL (ref 0.00–4.00)

## 2022-05-09 LAB — COMPREHENSIVE METABOLIC PANEL
ALT: 9 U/L (ref 0–44)
AST: 20 U/L (ref 15–41)
Albumin: 3.5 g/dL (ref 3.5–5.0)
Alkaline Phosphatase: 49 U/L (ref 38–126)
Anion gap: 12 (ref 5–15)
BUN: 28 mg/dL — ABNORMAL HIGH (ref 8–23)
CO2: 24 mmol/L (ref 22–32)
Calcium: 9.6 mg/dL (ref 8.9–10.3)
Chloride: 100 mmol/L (ref 98–111)
Creatinine, Ser: 1.08 mg/dL (ref 0.61–1.24)
GFR, Estimated: >60 mL/min (ref >60)
Glucose, Bld: 116 mg/dL — ABNORMAL HIGH (ref 70–99)
Potassium: 4.5 mmol/L (ref 3.5–5.1)
Sodium: 136 mmol/L (ref 135–145)
Total Bilirubin: 0.3 mg/dL (ref 0.3–1.2)
Total Protein: 6.9 g/dL (ref 6.5–8.1)

## 2022-05-09 LAB — MAGNESIUM: Magnesium: 1.8 mg/dL (ref 1.7–2.4)

## 2022-05-09 NOTE — Progress Notes (Signed)
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  PRIOR THERAPY: none  NGS Results: not done  CURRENT THERAPY: surveillance  BRIEF ONCOLOGIC HISTORY:  Oncology History  Prostate cancer ([place])  04/13/2014 Initial Diagnosis   Prostate cancer ([place])    06/25/2020 Cancer Staging   Staging form: Prostate, AJCC 8th Edition - Clinical stage from 06/25/2020: Stage IIC (cT2b, cN0, cM0, PSA: 12.8, Grade Group: 3) - Signed by Freeman Caldron, PA-C on 09/29/2020      CANCER STAGING: Cancer Staging  Prostate cancer [facility]) Staging form: Prostate, AJCC 8th Edition - Clinical stage from 06/25/2020: Stage IIC (cT2b, cN0, cM0, PSA: 12.8, Grade Group: 3) - Signed by Freeman Caldron, PA-C on 09/29/2020 - Pathologic stage from 01/26/2022: Stage IVB (pT2, pN0, pM1b, Grade Group: 3) - Unsigned   INTERVAL HISTORY:  Mr. Jamiere Gulas., a 81 y.o. 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 Hazen was last seen on 04/10/2022.   ***  REVIEW OF SYSTEMS:  Review of Systems  All other systems reviewed and are negative.  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   found during hospital stay with pancreatitis,  started coumadin;   last Abd CT in epic 07-29-2020  chronic thrombus portal venous   GERD (gastroesophageal reflux disease)    Heart murmur    History of acute pancreatitis    2016   necrotizing pancreatitis   History of adenomatous polyp of colon    History of DVT of lower extremity     History of GI bleed 08/2019   upper gi bleed due to duodenal ulcer   History of gout yrs ago   Hypertension    followed by pcp   Hypertrophic cardiomyopathy ([place])    followed by cardiology   Internal hemorrhoids    Mixed hyperlipidemia    Normocytic anemia    PAF (paroxysmal atrial fibrillation) ([place]) 04/2016   cardiologist-- dr h. Tamala Julian--  event monitor 07-04-2017 epic, NSR/ PAflutter with occasional RVR/  PACs/ PVCs ;  echo 05-16-2017 epic,  moderate LVH with severe asymptomic septal hypertrophy, G1DD, EF 60-65%,  mild AR, mild LAE   Pancreatic pseudocyst    Peyronie's disease    Prostate cancer [facility]) urologist-- dr wrenn/ oncologist-- dr Tammi Klippel   first dx 08/ 2009 Gleason 3+3 active survelliance;  until bx 07/ 2021  Stage T2b, Gleason 4+3   Right inguinal hernia    Type 2 diabetes mellitus ([place])    followed by pcp---  (11-03-2020 per pt currently not checking blood sugar due to glucose monitor broken)   Wears glasses    Wears partial dentures    upper and lower   Past Surgical History:  Procedure Laterality Date   BIOPSY  02/09/2022   Procedure: BIOPSY;  Surgeon: Milus Banister, MD;  Location: [facility];  Service: Endoscopy;;   CHOLECYSTECTOMY N/A 11/26/2015   Procedure: LAPAROSCOPIC CHOLECYSTECTOMY;  Surgeon: Aviva Signs, MD;  Location: [facility];  Service: General;  Laterality: N/A;   COLONOSCOPY  last one 09-06-2020  dr stark   CYSTOSCOPY N/A 11/09/2020   Procedure: Erlene Quan;  Surgeon: Irine Seal, MD;  Location: [facility];  Service: Urology;  Laterality: N/A;   ESOPHAGOGASTRODUODENOSCOPY  10-06-2019  '@[facility]'    ESOPHAGOGASTRODUODENOSCOPY N/A 02/09/2022   Procedure: ESOPHAGOGASTRODUODENOSCOPY (EGD);  Surgeon: Milus Banister, MD;  Location: [facility];  Service: Endoscopy;  Laterality: N/A;   EUS N/A 02/09/2022   Procedure: UPPER ENDOSCOPIC ULTRASOUND (EUS) RADIAL;  Surgeon: Milus Banister, MD;  Location: [facility];  Service: Endoscopy;   Laterality: N/A;   FLEXIBLE BRONCHOSCOPY Bilateral 04/14/2016   Procedure: FLEXIBLE BRONCHOSCOPY;  Surgeon: Sinda Du, MD;  [facility];  Service: Cardiopulmonary;  Laterality: Bilateral;   HEMORRHOID SURGERY  yrs ago   RADIOACTIVE SEED IMPLANT N/A 11/09/2020   Procedure: RADIOACTIVE SEED IMPLANT/BRACHYTHERAPY IMPLANT;  Surgeon: Irine Seal, MD;  Location: [facility];  Service: Urology;  Laterality: N/A;   SPACE OAR INSTILLATION N/A 11/09/2020   Procedure: SPACE OAR INSTILLATION;  Surgeon: Irine Seal, MD;  Location: [facility];  Service: Urology;  Laterality: N/A;   UMBILICAL HERNIA REPAIR N/A 11/26/2015   Procedure: UMBILICAL HERNIORRHAPHY;  Surgeon: Aviva Signs, MD;  Location: [facility];  Service: General;  Laterality: N/A;    SOCIAL HISTORY:  Social History   Socioeconomic History   Marital status: Married    Spouse name: [name]   Number of children: 4   Years of education: Not on file   Highest education level: 10th grade  Occupational History   Occupation: Retired     Fish farm manager: [employer]  Tobacco Use   Smoking status: Former    Years: 5.00    Types: Cigarettes    Quit date: 12/11/1986    Years since quitting: 35.4   Smokeless tobacco: Never  Vaping Use   Vaping Use: Never used  Substance and Sexual Activity   Alcohol use: No   Drug use: Never   Sexual activity: Yes  Other Topics Concern   Not on file  Social History Narrative   Not on file   Social Determinants of Health   Financial Resource Strain: Low Risk    Difficulty of Paying Living Expenses: Not hard at all  Food Insecurity: No Food Insecurity   Worried About Charity fundraiser in the Last Year: Never true   [place] in the Last Year: Never true  Transportation Needs: No Transportation Needs   Lack of Transportation (Medical): No   Lack of Transportation (Non-Medical): No  Physical Activity: Inactive   Days of Exercise per Week: 0 days   Minutes of  Exercise per Session: 0 min  Stress: Stress Concern Present   Feeling of Stress : To some extent  Social Connections: Socially Integrated   Frequency of Communication with Friends and Family: Three times a week   Frequency of Social Gatherings with Friends and Family: Three times a week   Attends Religious Services: More than 4 times per year   Active Member of Clubs or Organizations: Yes   Attends Music therapist: More than 4 times per year   Marital Status: Married  Human resources officer Violence: Not At Risk   Fear of Current or Ex-Partner: No   Emotionally Abused: No   Physically Abused: No   Sexually Abused: No    FAMILY HISTORY:  Family History  Problem Relation Age of Onset   Stroke Mother  Hypertension Mother    Alzheimer's disease Mother    Hypertension Father    Brain cancer Sister    Breast cancer Sister    Hypertension Sister    Deep vein thrombosis Brother    Hypertension Brother    Colon cancer Neg Hx    Pancreatic disease Neg Hx    Esophageal cancer Neg Hx    Rectal cancer Neg Hx    Stomach cancer Neg Hx     CURRENT MEDICATIONS:  Current Outpatient Medications  Medication Sig Dispense Refill   acetaminophen (TYLENOL) 500 MG tablet Take 1,000 mg by mouth every 6 (six) hours as needed for moderate pain.     amLODipine (NORVASC) 10 MG tablet Take 1 tablet (10 mg total) by mouth every evening. 90 tablet 3   apalutamide (ERLEADA) 60 MG tablet Take 4 tablets (240 mg total) by mouth daily. Take as directed. (Patient taking differently: Take 180 mg by mouth daily. Take as directed.) 120 tablet 0   Blood Glucose Monitoring Suppl (ONE TOUCH ULTRA MINI) w/Device KIT 1 each by Does not apply route 2 (two) times daily. 1 kit 1   Calcium Carb-Cholecalciferol (CALCIUM 600 + D PO) Take 1 tablet by mouth in the morning and at bedtime.     calcium carbonate (OSCAL) 1500 (600 Ca) MG TABS tablet Take by mouth 2 (two) times daily with a meal.     chlorthalidone  (HYGROTON) 25 MG tablet Take 1 tablet by mouth once daily 90 tablet 3   diclofenac Sodium (VOLTAREN) 1 % GEL Apply 1 application topically 2 (two) times daily as needed (pain).     fluticasone (FLONASE) 50 MCG/ACT nasal spray Place 1 spray into both nostrils 2 (two) times daily as needed for allergies or rhinitis. 16 g 6   glucose blood (ONE TOUCH ULTRA TEST) test strip Use to check BG once daily.  Dx:  Type 2 DM controlled E11.9 100 each 4   HYDROcodone-acetaminophen (NORCO/VICODIN) 5-325 MG tablet Take 1 tablet by mouth every 6 (six) hours as needed for moderate pain. Can take every 4 hours if needed. 30 tablet 0   lisinopril (ZESTRIL) 40 MG tablet Take 1 tablet (40 mg total) by mouth daily. 90 tablet 1   magnesium oxide (MAG-OX) 400 (240 Mg) MG tablet Take 1 tablet (400 mg total) by mouth daily. 90 tablet 6   metFORMIN (GLUCOPHAGE) 1000 MG tablet Take 1 tablet (1,000 mg total) by mouth 2 (two) times daily with a meal. 180 tablet 3   methylPREDNISolone (MEDROL DOSEPAK) 4 MG TBPK tablet Take as directed 21 tablet 0   metoprolol succinate (TOPROL-XL) 100 MG 24 hr tablet Take 1 tablet (100 mg total) by mouth daily. Take with or immediately following a meal. 90 tablet 3   omeprazole (PRILOSEC) 20 MG capsule Take 1 capsule (20 mg total) by mouth daily. 90 capsule 3   potassium chloride SA (KLOR-CON M) 20 MEQ tablet TAKE 1  BY MOUTH ONCE DAILY (Patient taking differently: Take 20 mEq by mouth daily.) 90 tablet 1   pravastatin (PRAVACHOL) 40 MG tablet Take 1 tablet (40 mg total) by mouth every evening. 90 tablet 3   SV IRON 325 MG tablet Take 1 tablet by mouth once daily with breakfast 90 tablet 0   warfarin (COUMADIN) 5 MG tablet TAKE 1 & 1/2 (ONE & ONE-HALF) TABLETS BY MOUTH ONCE DAILY 6 IN THE EVENING (Patient taking differently: Take 7.5 mg by mouth See admin instructions. 7.5 mg Monday,Wednesday,Friday 5 mg  Tuesday,Thursday,Saturday and sunday) 90 tablet 3   No current facility-administered  medications for this visit.    ALLERGIES:  Allergies  Allergen Reactions   Allopurinol Other (See Comments)    Significantly decreased WBC's   Colchicine Other (See Comments)    Significantly decreased WBC's    PHYSICAL EXAM:  Performance status (ECOG): 1 - Symptomatic but completely ambulatory  There were no vitals filed for this visit. Wt Readings from Last 3 Encounters:  04/11/22 144 lb 9.6 oz (65.6 kg)  04/10/22 143 lb 11.8 oz (65.2 kg)  03/27/22 148 lb (67.1 kg)   Physical Exam   LABORATORY DATA:  I have reviewed the labs as listed.     Latest Ref Rng & Units 05/09/2022   12:48 PM 04/10/2022    9:24 AM 03/27/2022   10:37 AM  CBC  WBC 4.0 - 10.5 K/uL 6.2   4.8   5.4    Hemoglobin 13.0 - 17.0 g/dL 9.1   10.1   10.4    Hematocrit 39.0 - 52.0 % 28.9   31.2   31.7    Platelets 150 - 400 K/uL 257   260   430        Latest Ref Rng & Units 05/09/2022   12:48 PM 04/10/2022    9:24 AM 03/27/2022   10:37 AM  CMP  Glucose 70 - 99 mg/dL 116   143   147    BUN 8 - 23 mg/dL '28   21   23    ' Creatinine 0.61 - 1.24 mg/dL 1.08   1.14   1.08    Sodium 135 - 145 mmol/L 136   137   140    Potassium 3.5 - 5.1 mmol/L 4.5   4.0   4.3    Chloride 98 - 111 mmol/L 100   100   97    CO2 22 - 32 mmol/L '24   24   21    ' Calcium 8.9 - 10.3 mg/dL 9.6   9.5   10.1    Total Protein 6.5 - 8.1 g/dL 6.9   7.4   6.8    Total Bilirubin 0.3 - 1.2 mg/dL 0.3   0.4   0.3    Alkaline Phos 38 - 126 U/L 49   80   134    AST 15 - 41 U/L '20   22   15    ' ALT 0 - 44 U/L '9   11   8      ' DIAGNOSTIC IMAGING:  I have independently reviewed the scans and discussed with the patient. No results found.   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  He has been on finasteride. - Biopsy on 12/24/2013: Right lateral mid-prostatic adenocarcinoma, Gleason 3+3=6 - Biopsy on 06/25/2020: Prostatic adenocarcinoma, T2b, Gleason 4+3=7, PSA 12.8 (adjusted  for finasteride) - Bone scan on 07/06/2020 and CT on 07/29/2020 negative. - 11/09/2020: Insertion of radioactive I-125 seeds - PSA 5.4 (6/22), 53.2 (11/17/2021), 66.7 (11/22/2021), 382 (01/12/2022) - CTAP on 11/25/2021 with no evidence of metastatic disease.  New ill-defined soft tissue adjacent to the pancreatic body encasing the proximal splenic artery, sequela of interval pancreatitis although neoplasm is considered.  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 - Bone scan on 12/08/2021 with possible uptake in the lower thoracic vertebral body.  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 - CT chest without contrast on 01/03/2022  with no CT correlate in the lower thoracic spine.  No additional metastatic disease. - Degarelix  240 mg by Dr. Jeffie Pollock on 01/12/2022. - PSMA PET scan on 01/30/2022: Multiple skeletal metastasis in the axillary and appendicular skeleton with nearly every vertebral body involved with carcinoma.  Broad lesions in the sacrum and pelvis.  Focal activity in the anterior apical region of the prostate gland.  No metastatic adenopathy or visceral metastasis. [place] 3 tablets daily started around 02/08/2022, dose increased to 4 tablets daily on 03/21/2022. -He was hospitalized from 03/12/2022 through 03/14/2022 with pneumonia.  I have reviewed hospitalization records.  He reportedly fell on the night of 03/14/2022 when he lost balance while hanging his robe.    Social/family history: - Lives with wife and granddaughter at home.  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  Quit smoking 50 years ago. - 2 sisters had brain cancer.  Maternal grandmother had cancer.  Niece had breast cancer.   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 - Since then he has noticed diarrhea twice daily which was watery.  He also noticed worsening of shortness of breath on exertion.  He noticed rash which was  erythematous maculopapular predominantly in in the posterior left leg and blistering on left foot.  There is also scattered rash elsewhere on the body. - We will give him Medrol Dosepak and will tell him to apply hydrocortisone cream twice daily. - I will cut back on Erleada to 3 tablets daily. - He will follow-up with Dr. Jeffie Pollock next week for degarelix. - RTC 4 weeks with repeat labs and PSA.  2.  Bone metastasis: - Calcium is 9.5.  We discussed side effects of denosumab.  He will proceed with denosumab today.  3.  Atrial fibrillation and history of unprovoked right leg DVT: - Continue warfarin.  No bleeding reported.  4.  Normocytic anemia: - CBC today shows hemoglobin 10.1 with MCV 86. - Anemia labs showed ferritin of 11.  T24 folic acid are pending.  5.  Hypomagnesemia: - Magnesium is 1.4.  Likely from diarrhea. - We will start him on magnesium 400 mg 3 times daily.   Orders placed this encounter:  No orders of the defined types were placed in this encounter.    Derek Jack, MD [place] [phone]   I, Thana Ates, am acting as a scribe for Dr. Derek Jack.  {Add Barista Statement}

## 2022-05-11 ENCOUNTER — Encounter (HOSPITAL_COMMUNITY): Payer: Self-pay | Admitting: Hematology

## 2022-05-11 ENCOUNTER — Inpatient Hospital Stay (HOSPITAL_COMMUNITY): Payer: No Typology Code available for payment source

## 2022-05-11 ENCOUNTER — Inpatient Hospital Stay (HOSPITAL_COMMUNITY): Payer: No Typology Code available for payment source | Attending: Hematology | Admitting: Hematology

## 2022-05-11 VITALS — BP 135/58 | HR 70 | Temp 97.8°F | Resp 18 | Wt 142.5 lb

## 2022-05-11 DIAGNOSIS — I10 Essential (primary) hypertension: Secondary | ICD-10-CM | POA: Insufficient documentation

## 2022-05-11 DIAGNOSIS — Z7901 Long term (current) use of anticoagulants: Secondary | ICD-10-CM | POA: Insufficient documentation

## 2022-05-11 DIAGNOSIS — D649 Anemia, unspecified: Secondary | ICD-10-CM | POA: Diagnosis not present

## 2022-05-11 DIAGNOSIS — Z86718 Personal history of other venous thrombosis and embolism: Secondary | ICD-10-CM | POA: Insufficient documentation

## 2022-05-11 DIAGNOSIS — Z808 Family history of malignant neoplasm of other organs or systems: Secondary | ICD-10-CM | POA: Insufficient documentation

## 2022-05-11 DIAGNOSIS — I480 Paroxysmal atrial fibrillation: Secondary | ICD-10-CM | POA: Diagnosis not present

## 2022-05-11 DIAGNOSIS — Z823 Family history of stroke: Secondary | ICD-10-CM | POA: Insufficient documentation

## 2022-05-11 DIAGNOSIS — Z79899 Other long term (current) drug therapy: Secondary | ICD-10-CM | POA: Insufficient documentation

## 2022-05-11 DIAGNOSIS — Z8249 Family history of ischemic heart disease and other diseases of the circulatory system: Secondary | ICD-10-CM | POA: Insufficient documentation

## 2022-05-11 DIAGNOSIS — E782 Mixed hyperlipidemia: Secondary | ICD-10-CM | POA: Diagnosis not present

## 2022-05-11 DIAGNOSIS — C61 Malignant neoplasm of prostate: Secondary | ICD-10-CM | POA: Insufficient documentation

## 2022-05-11 DIAGNOSIS — R197 Diarrhea, unspecified: Secondary | ICD-10-CM | POA: Diagnosis not present

## 2022-05-11 DIAGNOSIS — C7951 Secondary malignant neoplasm of bone: Secondary | ICD-10-CM

## 2022-05-11 DIAGNOSIS — R0602 Shortness of breath: Secondary | ICD-10-CM | POA: Insufficient documentation

## 2022-05-11 DIAGNOSIS — Z7952 Long term (current) use of systemic steroids: Secondary | ICD-10-CM | POA: Diagnosis not present

## 2022-05-11 DIAGNOSIS — K219 Gastro-esophageal reflux disease without esophagitis: Secondary | ICD-10-CM | POA: Diagnosis not present

## 2022-05-11 DIAGNOSIS — Z803 Family history of malignant neoplasm of breast: Secondary | ICD-10-CM | POA: Insufficient documentation

## 2022-05-11 DIAGNOSIS — Z86010 Personal history of colonic polyps: Secondary | ICD-10-CM | POA: Insufficient documentation

## 2022-05-11 DIAGNOSIS — K861 Other chronic pancreatitis: Secondary | ICD-10-CM | POA: Insufficient documentation

## 2022-05-11 DIAGNOSIS — Z8701 Personal history of pneumonia (recurrent): Secondary | ICD-10-CM | POA: Insufficient documentation

## 2022-05-11 DIAGNOSIS — Z9049 Acquired absence of other specified parts of digestive tract: Secondary | ICD-10-CM | POA: Insufficient documentation

## 2022-05-11 DIAGNOSIS — Z8719 Personal history of other diseases of the digestive system: Secondary | ICD-10-CM | POA: Insufficient documentation

## 2022-05-11 DIAGNOSIS — E119 Type 2 diabetes mellitus without complications: Secondary | ICD-10-CM | POA: Diagnosis not present

## 2022-05-11 DIAGNOSIS — Z818 Family history of other mental and behavioral disorders: Secondary | ICD-10-CM | POA: Diagnosis not present

## 2022-05-11 DIAGNOSIS — Z87891 Personal history of nicotine dependence: Secondary | ICD-10-CM | POA: Insufficient documentation

## 2022-05-11 DIAGNOSIS — I422 Other hypertrophic cardiomyopathy: Secondary | ICD-10-CM | POA: Insufficient documentation

## 2022-05-11 MED ORDER — DENOSUMAB 120 MG/1.7ML ~~LOC~~ SOLN
120.0000 mg | Freq: Once | SUBCUTANEOUS | Status: AC
  Administered 2022-05-11: 120 mg via SUBCUTANEOUS

## 2022-05-11 NOTE — Progress Notes (Signed)
Xgeva injection given per orders. Patient tolerated it well without problems. Vitals stable and discharged home from clinic ambulatory. Follow up as scheduled.  

## 2022-05-11 NOTE — Patient Instructions (Addendum)
[place] at [facility] Discharge Instructions  You were seen and examined today by Dr. Delton Coombes.  Dr. Delton Coombes discussed your most recent lab work and everything looks good except for your iron levels are low. Dr. Delton Coombes is going to set you up for IV iron. Continue taking your Erleada as prescribed. Discontinue iron pill. Take Imodium as needed for diarrhea.  Follow-up as scheduled.    Thank you for choosing [place] at [facility] to provide your oncology and hematology care.  To afford each patient quality time with our provider, please arrive at least 15 minutes before your scheduled appointment time.   If you have a lab appointment with the [facility] please come in thru the Main Entrance and check in at the main information desk.  You need to re-schedule your appointment should you arrive 10 or more minutes late.  We strive to give you quality time with our providers, and arriving late affects you and other patients whose appointments are after yours.  Also, if you no show three or more times for appointments you may be dismissed from the clinic at the providers discretion.     Again, thank you for choosing [facility].  Our hope is that these requests will decrease the amount of time that you wait before being seen by our physicians.       _____________________________________________________________  Should you have questions after your visit to [facility], please contact our office at [phone] and follow the prompts.  Our office hours are 8:00 a.m. and 4:30 p.m. Monday - Friday.  Please note that voicemails left after 4:00 p.m. may not be returned until the following business day.  We are closed weekends and major holidays.  You do have access to a nurse 24-7, just call the main number to the clinic [phone] and do not press any options, hold on the line and a nurse will answer the phone.     For prescription refill requests, have your pharmacy contact our office and allow 72 hours.    Due to Covid, you will need to wear a mask upon entering the hospital. If you do not have a mask, a mask will be given to you at the Main Entrance upon arrival. For doctor visits, patients may have 1 support person age 79 or older with them. For treatment visits, patients can not have anyone with them due to social distancing guidelines and our immunocompromised population.

## 2022-05-12 ENCOUNTER — Encounter (HOSPITAL_COMMUNITY): Payer: Self-pay | Admitting: Hematology

## 2022-05-15 ENCOUNTER — Other Ambulatory Visit: Payer: Self-pay | Admitting: *Deleted

## 2022-05-15 DIAGNOSIS — D6859 Other primary thrombophilia: Secondary | ICD-10-CM

## 2022-05-15 MED ORDER — WARFARIN SODIUM 5 MG PO TABS: ORAL_TABLET | ORAL | 0 refills | Status: DC

## 2022-05-18 ENCOUNTER — Other Ambulatory Visit: Payer: No Typology Code available for payment source

## 2022-05-18 ENCOUNTER — Telehealth: Payer: Self-pay

## 2022-05-18 ENCOUNTER — Other Ambulatory Visit: Payer: Self-pay

## 2022-05-18 DIAGNOSIS — K869 Disease of pancreas, unspecified: Secondary | ICD-10-CM

## 2022-05-18 NOTE — Telephone Encounter (Signed)
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 Ardis Hughs. Schedulers have been notified, however number has been provided for wife to call if she has not received a call from them in 1 week. Patient's wife verbalized understanding, no further questions.

## 2022-05-22 ENCOUNTER — Inpatient Hospital Stay (HOSPITAL_COMMUNITY): Payer: No Typology Code available for payment source

## 2022-05-22 ENCOUNTER — Encounter (HOSPITAL_COMMUNITY): Payer: Self-pay

## 2022-05-22 VITALS — BP 137/41 | HR 70 | Temp 98.2°F | Resp 18

## 2022-05-22 DIAGNOSIS — C61 Malignant neoplasm of prostate: Secondary | ICD-10-CM

## 2022-05-22 DIAGNOSIS — K869 Disease of pancreas, unspecified: Secondary | ICD-10-CM

## 2022-05-22 DIAGNOSIS — C7951 Secondary malignant neoplasm of bone: Secondary | ICD-10-CM

## 2022-05-22 LAB — COMPREHENSIVE METABOLIC PANEL
ALT: 10 U/L (ref 0–44)
AST: 22 U/L (ref 15–41)
Albumin: 3.2 g/dL — ABNORMAL LOW (ref 3.5–5.0)
Alkaline Phosphatase: 43 U/L (ref 38–126)
Anion gap: 10 (ref 5–15)
BUN: 22 mg/dL (ref 8–23)
CO2: 26 mmol/L (ref 22–32)
Calcium: 9.3 mg/dL (ref 8.9–10.3)
Chloride: 100 mmol/L (ref 98–111)
Creatinine, Ser: 1.06 mg/dL (ref 0.61–1.24)
GFR, Estimated: >60 mL/min (ref >60)
Glucose, Bld: 202 mg/dL — ABNORMAL HIGH (ref 70–99)
Potassium: 3.7 mmol/L (ref 3.5–5.1)
Sodium: 136 mmol/L (ref 135–145)
Total Bilirubin: 0.6 mg/dL (ref 0.3–1.2)
Total Protein: 6.2 g/dL — ABNORMAL LOW (ref 6.5–8.1)

## 2022-05-22 LAB — CBC WITH DIFFERENTIAL/PLATELET
Abs Immature Granulocytes: 0.01 10*3/uL (ref 0.00–0.07)
Basophils Absolute: 0 10*3/uL (ref 0.0–0.1)
Basophils Relative: 0 %
Eosinophils Absolute: 0.1 10*3/uL (ref 0.0–0.5)
Eosinophils Relative: 2 %
HCT: 24.4 % — ABNORMAL LOW (ref 39.0–52.0)
Hemoglobin: 7.8 g/dL — ABNORMAL LOW (ref 13.0–17.0)
Immature Granulocytes: 0 %
Lymphocytes Relative: 16 %
Lymphs Abs: 0.6 10*3/uL — ABNORMAL LOW (ref 0.7–4.0)
MCH: 28.2 pg (ref 26.0–34.0)
MCHC: 32 g/dL (ref 30.0–36.0)
MCV: 88.1 fL (ref 80.0–100.0)
Monocytes Absolute: 0.4 10*3/uL (ref 0.1–1.0)
Monocytes Relative: 11 %
Neutro Abs: 2.4 10*3/uL (ref 1.7–7.7)
Neutrophils Relative %: 71 %
Platelets: 236 10*3/uL (ref 150–400)
RBC: 2.77 MIL/uL — ABNORMAL LOW (ref 4.22–5.81)
RDW: 16.7 % — ABNORMAL HIGH (ref 11.5–15.5)
WBC: 3.5 10*3/uL — ABNORMAL LOW (ref 4.0–10.5)
nRBC: 0 % (ref 0.0–0.2)

## 2022-05-22 LAB — IRON AND TIBC
Iron: 24 ug/dL — ABNORMAL LOW (ref 45–182)
Saturation Ratios: 7 % — ABNORMAL LOW (ref 17.9–39.5)
TIBC: 346 ug/dL (ref 250–450)
UIBC: 322 ug/dL

## 2022-05-22 LAB — PSA: Prostatic Specific Antigen: 3.57 ng/mL (ref 0.00–4.00)

## 2022-05-22 LAB — FERRITIN: Ferritin: 5 ng/mL — ABNORMAL LOW (ref 24–336)

## 2022-05-22 MED ORDER — LORATADINE 10 MG PO TABS
10.0000 mg | ORAL_TABLET | Freq: Once | ORAL | Status: AC
  Administered 2022-05-22: 10 mg via ORAL
  Filled 2022-05-22: qty 1

## 2022-05-22 MED ORDER — ACETAMINOPHEN 325 MG PO TABS
650.0000 mg | ORAL_TABLET | Freq: Once | ORAL | Status: AC
  Administered 2022-05-22: 650 mg via ORAL
  Filled 2022-05-22: qty 2

## 2022-05-22 MED ORDER — SODIUM CHLORIDE 0.9 % IV SOLN: Freq: Once | INTRAVENOUS | Status: AC

## 2022-05-22 MED ORDER — SODIUM CHLORIDE 0.9 % IV SOLN
510.0000 mg | Freq: Once | INTRAVENOUS | Status: AC
  Administered 2022-05-22: 510 mg via INTRAVENOUS
  Filled 2022-05-22: qty 17

## 2022-05-22 NOTE — Progress Notes (Signed)
Patient presents today for Feraheme infusion. Vital signs stable. Patient has no complaints today. MAR reviewed and updated.   Feraheme given today per MD orders. Tolerated infusion without adverse affects. Vital signs stable. No complaints at this time. Discharged from clinic by wheel  chair in stable condition. Alert and oriented x 3. F/U with [facility] as scheduled.

## 2022-05-22 NOTE — Patient Instructions (Signed)
[place]  Discharge Instructions: Thank you for choosing [facility] to provide your oncology and hematology care.  If you have a lab appointment with the [facility], please come in thru the Main Entrance and check in at the main information desk.  Wear comfortable clothing and clothing appropriate for easy access to any Portacath or PICC line.   We strive to give you quality time with your provider. You may need to reschedule your appointment if you arrive late (15 or more minutes).  Arriving late affects you and other patients whose appointments are after yours.  Also, if you miss three or more appointments without notifying the office, you may be dismissed from the clinic at the provider's discretion.      For prescription refill requests, have your pharmacy contact our office and allow 72 hours for refills to be completed.    Today you received the following chemotherapy and/or immunotherapy agents Feraheme.  Ferumoxytol Injection What is this medication? FERUMOXYTOL (FER ue MOX i tol) treats low levels of iron in your body (iron deficiency anemia). Iron is a mineral that plays an important role in making red blood cells, which carry oxygen from your lungs to the rest of your body. This medicine may be used for other purposes; ask your health care provider or pharmacist if you have questions. COMMON BRAND NAME(S): Feraheme What should I tell my care team before I take this medication? They need to know if you have any of these conditions: Anemia not caused by low iron levels High levels of iron in the blood Magnetic resonance imaging (MRI) test scheduled An unusual or allergic reaction to iron, other medications, foods, dyes, or preservatives Pregnant or trying to get pregnant Breast-feeding How should I use this medication? This medication is for injection into a vein. It is given in a hospital or clinic setting. Talk to your care team the use of this  medication in children. Special care may be needed. Overdosage: If you think you have taken too much of this medicine contact a poison control center or emergency room at once. NOTE: This medicine is only for you. Do not share this medicine with others. What if I miss a dose? It is important not to miss your dose. Call your care team if you are unable to keep an appointment. What may interact with this medication? Other iron products This list may not describe all possible interactions. Give your health care provider a list of all the medicines, herbs, non-prescription drugs, or dietary supplements you use. Also tell them if you smoke, drink alcohol, or use illegal drugs. Some items may interact with your medicine. What should I watch for while using this medication? Visit your care team regularly. Tell your care team if your symptoms do not start to get better or if they get worse. You may need blood work done while you are taking this medication. You may need to follow a special diet. Talk to your care team. Foods that contain iron include: whole grains/cereals, dried fruits, beans, or peas, leafy green vegetables, and organ meats (liver, kidney). What side effects may I notice from receiving this medication? Side effects that you should report to your care team as soon as possible: Allergic reactions--skin rash, itching, hives, swelling of the face, lips, tongue, or throat Low blood pressure--dizziness, feeling faint or lightheaded, blurry vision Shortness of breath Side effects that usually do not require medical attention (report to your care team if  they continue or are bothersome): Flushing Headache Joint pain Muscle pain Nausea Pain, redness, or irritation at injection site This list may not describe all possible side effects. Call your doctor for medical advice about side effects. You may report side effects to FDA at 1-800-FDA-1088. Where should I keep my medication? This  medication is given in a hospital or clinic and will not be stored at home. NOTE: This sheet is a summary. It may not cover all possible information. If you have questions about this medicine, talk to your doctor, pharmacist, or health care provider.  2023 Elsevier/Gold Standard (2021-04-22 00:00:00)       To help prevent nausea and vomiting after your treatment, we encourage you to take your nausea medication as directed.  BELOW ARE SYMPTOMS THAT SHOULD BE REPORTED IMMEDIATELY: *FEVER GREATER THAN 100.4 F (38 C) OR HIGHER *CHILLS OR SWEATING *NAUSEA AND VOMITING THAT IS NOT CONTROLLED WITH YOUR NAUSEA MEDICATION *UNUSUAL SHORTNESS OF BREATH *UNUSUAL BRUISING OR BLEEDING *URINARY PROBLEMS (pain or burning when urinating, or frequent urination) *BOWEL PROBLEMS (unusual diarrhea, constipation, pain near the anus) TENDERNESS IN MOUTH AND THROAT WITH OR WITHOUT PRESENCE OF ULCERS (sore throat, sores in mouth, or a toothache) UNUSUAL RASH, SWELLING OR PAIN  UNUSUAL VAGINAL DISCHARGE OR ITCHING   Items with * indicate a potential emergency and should be followed up as soon as possible or go to the Emergency Department if any problems should occur.  Please show the CHEMOTHERAPY ALERT CARD or IMMUNOTHERAPY ALERT CARD at check-in to the Emergency Department and triage nurse.  Should you have questions after your visit or need to cancel or reschedule your appointment, please contact [facility] [phone]  and follow the prompts.  Office hours are 8:00 a.m. to 4:30 p.m. Monday - Friday. Please note that voicemails left after 4:00 p.m. may not be returned until the following business day.  We are closed weekends and major holidays. You have access to a nurse at all times for urgent questions. Please call the main number to the clinic [phone] and follow the prompts.  For any non-urgent questions, you may also contact your provider using MyChart. We now offer e-Visits for anyone 79  and older to request care online for non-urgent symptoms. For details visit [email or web address].   Also download the MyChart app! Go to the app store, search "MyChart", open the app, select [facility], and log in with your MyChart username and password.  Due to Covid, a mask is required upon entering the hospital/clinic. If you do not have a mask, one will be given to you upon arrival. For doctor visits, patients may have 1 support person aged 15 or older with them. For treatment visits, patients cannot have anyone with them due to current Covid guidelines and our immunocompromised population.

## 2022-05-25 ENCOUNTER — Ambulatory Visit (INDEPENDENT_AMBULATORY_CARE_PROVIDER_SITE_OTHER): Payer: No Typology Code available for payment source | Admitting: Urology

## 2022-05-25 ENCOUNTER — Encounter: Payer: Self-pay | Admitting: Urology

## 2022-05-25 VITALS — BP 148/57 | HR 76

## 2022-05-25 DIAGNOSIS — K4090 Unilateral inguinal hernia, without obstruction or gangrene, not specified as recurrent: Secondary | ICD-10-CM | POA: Diagnosis not present

## 2022-05-25 DIAGNOSIS — C7951 Secondary malignant neoplasm of bone: Secondary | ICD-10-CM

## 2022-05-25 DIAGNOSIS — Z8546 Personal history of malignant neoplasm of prostate: Secondary | ICD-10-CM

## 2022-05-25 DIAGNOSIS — N403 Nodular prostate with lower urinary tract symptoms: Secondary | ICD-10-CM

## 2022-05-25 DIAGNOSIS — Z85830 Personal history of malignant neoplasm of bone: Secondary | ICD-10-CM | POA: Diagnosis not present

## 2022-05-25 DIAGNOSIS — R351 Nocturia: Secondary | ICD-10-CM

## 2022-05-25 DIAGNOSIS — C61 Malignant neoplasm of prostate: Secondary | ICD-10-CM

## 2022-05-25 DIAGNOSIS — R9721 Rising PSA following treatment for malignant neoplasm of prostate: Secondary | ICD-10-CM | POA: Diagnosis not present

## 2022-05-25 MED ORDER — DEGARELIX ACETATE 80 MG ~~LOC~~ SOLR
80.0000 mg | Freq: Once | SUBCUTANEOUS | Status: AC
  Administered 2022-05-25: 80 mg via SUBCUTANEOUS

## 2022-05-25 NOTE — Progress Notes (Signed)
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   Medication: Firmagon (Degarelix)  Dose: '80mg'$  Location: right upper abdomen  Patient tolerated well, no complications were noted  Performed by: Duante Arocho   Follow up: 1 month firmagon

## 2022-05-25 NOTE — Progress Notes (Signed)
Subjective:  1. Prostate cancer ([place])   2. 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 Rising PSA following treatment for malignant neoplasm of prostate   4. Nodular prostate with lower urinary tract symptoms   5. Nocturia   6. 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   His PSA has declined to 5.4 from 8.1 in 3/22.  He is voiding ok with moderate LUTS and an IPSS of 14 with nocturia x 3.   He has had no further gross hematuria and his UA is unremarkable today.   He remains on warfarin and his INR is 2.4 on 05/12/21.     On initial biopsy, he had only 5% in one core and elected active survelliance. He had a repeat biopsy on 12/24/13 that showed a single core with <5% Gleason 6 in the right mid lateral prostate. His last biopsy was on 06/28/18 and only showed 2 cores with atypia. He is on finasteride and has had  a slowly rising PSA that was up to 6.4 prior to his most recent biopsy on 06/25/20.  The nadir on finasteride was 1.8.    The repeat biopsy demonstrated 6 positive cores with 2 cores with 7(4+3) on the right and 4 cores with low volume Gleason 6 disease.    His prostate volume is 67m. IPSS is 9.  A bone scan on 07/06/20 was negative.    The CT on 07/29/20 was negative for mets.   His PSA was 6.2 prior to therapy starting finasteride.   11/24/21:  [name] for the history above.  His PSA was 5.4 in 6/22 but a repeat prior to this visit was 53.2 on 11/17/21 and then 66.7 on 11/22/21.  He has no weight loss but he has some new hip pain for the last 3 weeks.  He has no new voiding complaint but he has urgency with UUI.  His UA is unremarkable today.    01/12/22: [name].  He was found to have a rapidly rising PSA and was restaged.  The bone scan showed a possible thoracic vertebral met but this was not confirmed by Chest CT.   The CT [facility] showed no evidence of mets but a new peripancreatic process so GI was consulted.   An abdominal MRI was obtained that showed multiple mets in the pelvic bones.   He is scheduled for upper endoscopy for the pancreatic lesion on 02/09/22.    02/16/22: [name] for his history of metastatic prostate cancer.  He got an initial dose of Firmagon on 01/12/22.   His PSA was 382 prior to the shot.   He had labs on 01/26/22 with Oncology and his PSA was down to 72 but he has an elevated Alk phos of 392 and a hgb of 9.5.  Apalutamide has been prescribed and he is tolerating it well without fatigue but he has some constipation.  He has no hot flashes.  He has back pain after a fall about 2 weeks ago and has to sleep sitting up but it is slowly improving but he had no fractures.   He  had an upper endoscopy last week and had a negative biopsy in the stomach.  He didn't have a biopsy of the pancreas.  He eats well.   He is due for firmagon 96m today and has [name] on 3/20 with Dr. KDelton Coombes   05/25/22: [name] returns today in [name] for his history of metastatic prostate cancer.  He remains on firmagon 894mmonthly and last got an injection on 04/20/22 and is due today.  He remains on apalutamide with Dr. KaDelton Coombes His PSA is 3.57 on 05/22/22 which is minimally increased from 3.26 on 05/09/22 but down from 72.3 on 01/26/22.  He is voiding without difficulty.  He has some nocturia. He has some mild left lateral lower leg pain.  He has last lost some weight over the last few months.   He has no neurologic complaints.   He is not having hot flashes.   His glucose was 202 on 6/12 and his Albumin was 3.2.  He has had a progressive anemia with a hgb of  7.8 on 6/12 and he got a unit of PRBC's.         ROS:  ROS:  A complete review of systems was performed.  All systems are negative except for pertinent findings as noted.   Review of Systems  Constitutional:  Positive for weight loss.  All other systems reviewed and are negative.   Allergies  Allergen Reactions   Allopurinol Other (See Comments)    Significantly decreased WBC's   Colchicine Other (See Comments)    Significantly decreased WBC's    Outpatient Encounter Medications as of 05/25/2022  Medication Sig   acetaminophen (TYLENOL) 500 MG tablet Take 1,000 mg by mouth every 6 (six) hours as needed for moderate pain.   amLODipine (NORVASC) 10 MG tablet Take 1 tablet (10 mg total) by mouth every evening.   apalutamide (ERLEADA) 60 MG tablet Take 4 tablets (240 mg total) by mouth daily. Take as directed. (Patient taking differently: Take 180 mg by mouth daily. Take as directed.)   Blood Glucose Monitoring Suppl (ONE TOUCH ULTRA MINI) w/Device KIT 1 each by Does not apply route 2 (two) times daily.   Calcium Carb-Cholecalciferol (CALCIUM 600 + D PO) Take 1 tablet by mouth in the morning and at bedtime.   calcium carbonate (OSCAL) 1500 (600 Ca) MG TABS tablet Take by mouth 2 (two) times daily with a meal.   chlorthalidone (HYGROTON) 25 MG tablet Take 1 tablet by mouth once daily   diclofenac Sodium (VOLTAREN) 1 % GEL Apply 1 application topically 2 (two) times daily as needed (pain).   fluticasone (FLONASE) 50 MCG/ACT nasal spray Place 1 spray into both nostrils 2 (two) times daily as needed for allergies or rhinitis.   glucose blood (ONE TOUCH ULTRA TEST) test strip Use to check BG once daily.  Dx:  Type 2 DM controlled E11.9   HYDROcodone-acetaminophen (NORCO/VICODIN) 5-325 MG tablet Take 1 tablet by mouth every 6 (six) hours as needed for moderate pain. Can take every 4 hours if needed.   lisinopril (ZESTRIL) 40 MG tablet Take 1 tablet (40 mg total) by mouth daily.   magnesium oxide (MAG-OX) 400 (240 Mg) MG tablet Take 1  tablet (400 mg total) by mouth daily.   metFORMIN (GLUCOPHAGE) 1000 MG tablet Take 1 tablet (1,000 mg total) by mouth 2 (two) times daily with a meal.   methylPREDNISolone (MEDROL DOSEPAK) 4 MG TBPK tablet Take as directed   metoprolol succinate (TOPROL-XL) 100 MG 24  hr tablet Take 1 tablet (100 mg total) by mouth daily. Take with or immediately following a meal.   omeprazole (PRILOSEC) 20 MG capsule Take 1 capsule (20 mg total) by mouth daily.   potassium chloride SA (KLOR-CON M) 20 MEQ tablet TAKE 1  BY MOUTH ONCE DAILY (Patient taking differently: Take 20 mEq by mouth daily.)   pravastatin (PRAVACHOL) 40 MG tablet Take 1 tablet (40 mg total) by mouth every evening.   SV IRON 325 MG tablet Take 1 tablet by mouth once daily with breakfast   warfarin (COUMADIN) 5 MG tablet TAKE 1 & 1/2 (ONE & ONE-HALF) TABLETS BY MOUTH ONCE DAILY 6 IN THE EVENING   [EXPIRED] degarelix (FIRMAGON) injection 80 mg    No facility-administered encounter medications on file as of 05/25/2022.    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   found during hospital stay with pancreatitis,  started coumadin;   last Abd CT in epic 07-29-2020  chronic thrombus portal venous   GERD (gastroesophageal reflux disease)    Heart murmur    History of acute pancreatitis    2016   necrotizing pancreatitis   History of adenomatous polyp of colon    History of DVT of lower extremity    History of GI bleed 08/2019   upper gi bleed due to duodenal ulcer   History of gout yrs ago   Hypertension    followed by pcp   Hypertrophic cardiomyopathy ([place])    followed by cardiology   Internal hemorrhoids    Mixed hyperlipidemia    Normocytic anemia    PAF (paroxysmal atrial fibrillation) ([place])  04/2016   cardiologist-- dr h. Tamala Julian--  event monitor 07-04-2017 epic, NSR/ PAflutter with occasional RVR/  PACs/ PVCs ;  echo 05-16-2017 epic,  moderate LVH with severe asymptomic septal hypertrophy, G1DD, EF 60-65%,  mild AR, mild LAE   Pancreatic pseudocyst    Peyronie's disease    Prostate cancer [facility]) urologist-- dr Jayleah Garbers/ oncologist-- dr Tammi Klippel   first dx 08/ 2009 Gleason 3+3 active survelliance;  until bx 07/ 2021  Stage T2b, Gleason 4+3   Right inguinal hernia    Type 2 diabetes mellitus ([place])    followed by pcp---  (11-03-2020 per pt currently not checking blood sugar due to glucose monitor broken)   Wears glasses    Wears partial dentures    upper and lower    Past Surgical History:  Procedure Laterality Date   BIOPSY  02/09/2022   Procedure: BIOPSY;  Surgeon: Milus Banister, MD;  Location: [facility];  Service: Endoscopy;;   CHOLECYSTECTOMY N/A 11/26/2015   Procedure: LAPAROSCOPIC CHOLECYSTECTOMY;  Surgeon: Aviva Signs, MD;  Location: [facility];  Service: General;  Laterality: N/A;   COLONOSCOPY  last one 09-06-2020  dr stark   CYSTOSCOPY N/A 11/09/2020   Procedure: Erlene Quan;  Surgeon: Irine Seal, MD;  Location: [facility];  Service: Urology;  Laterality: N/A;   ESOPHAGOGASTRODUODENOSCOPY  10-06-2019  '@[facility]'    ESOPHAGOGASTRODUODENOSCOPY N/A 02/09/2022   Procedure: ESOPHAGOGASTRODUODENOSCOPY (EGD);  Surgeon: Milus Banister, MD;  Location: [facility];  Service: Endoscopy;  Laterality: N/A;   EUS N/A 02/09/2022   Procedure: UPPER ENDOSCOPIC ULTRASOUND (EUS)  RADIAL;  Surgeon: Milus Banister, MD;  Location: [facility];  Service: Endoscopy;  Laterality: N/A;   FLEXIBLE BRONCHOSCOPY Bilateral 04/14/2016   Procedure: FLEXIBLE BRONCHOSCOPY;  Surgeon: Sinda Du, MD;  [facility];  Service: Cardiopulmonary;  Laterality: Bilateral;   HEMORRHOID SURGERY  yrs ago   RADIOACTIVE SEED IMPLANT N/A 11/09/2020   Procedure: RADIOACTIVE SEED  IMPLANT/BRACHYTHERAPY IMPLANT;  Surgeon: Irine Seal, MD;  Location: [facility];  Service: Urology;  Laterality: N/A;   SPACE OAR INSTILLATION N/A 11/09/2020   Procedure: SPACE OAR INSTILLATION;  Surgeon: Irine Seal, MD;  Location: [facility];  Service: Urology;  Laterality: N/A;   UMBILICAL HERNIA REPAIR N/A 11/26/2015   Procedure: UMBILICAL HERNIORRHAPHY;  Surgeon: Aviva Signs, MD;  Location: [facility];  Service: General;  Laterality: N/A;    Social History   Socioeconomic History   Marital status: Married    Spouse name: [name]   Number of children: 4   Years of education: Not on file   Highest education level: 10th grade  Occupational History   Occupation: Retired     Fish farm manager: [employer]  Tobacco Use   Smoking status: Former    Years: 5.00    Types: Cigarettes    Quit date: 12/11/1986    Years since quitting: 35.4   Smokeless tobacco: Never  Vaping Use   Vaping Use: Never used  Substance and Sexual Activity   Alcohol use: No   Drug use: Never   Sexual activity: Yes  Other Topics Concern   Not on file  Social History Narrative   Not on file   Social Determinants of Health   Financial Resource Strain: Low Risk  (08/12/2021)   Overall Financial Resource Strain (CARDIA)    Difficulty of Paying Living Expenses: Not hard at all  Food Insecurity: No Food Insecurity (08/12/2021)   Hunger Vital Sign    Worried About Running Out of Food in the Last Year: Never true    [place] in the Last Year: Never true  Transportation Needs: No Transportation Needs (08/12/2021)   PRAPARE - Hydrologist (Medical): No    Lack of Transportation (Non-Medical): No  Physical Activity: Inactive (10/25/2021)   Exercise Vital Sign    Days of Exercise per Week: 0 days    Minutes of Exercise per Session: 0 min  Stress: Stress Concern Present (10/25/2021)   [place]     Feeling of Stress : To some extent  Social Connections: Socially Integrated (08/12/2021)   Social Connection and Isolation Panel [NHANES]    Frequency of Communication with Friends and Family: Three times a week    Frequency of Social Gatherings with Friends and Family: Three times a week    Attends Religious Services: More than 4 times per year    Active Member of Clubs or Organizations: Yes    Attends Archivist Meetings: More than 4 times per year    Marital Status: Married  Human resources officer Violence: Not At Risk (08/12/2021)   Humiliation, Afraid, Rape, and Kick questionnaire    Fear of Current or Ex-Partner: No    Emotionally Abused: No    Physically Abused: No    Sexually Abused: No    Family History  Problem Relation Age of Onset   Stroke Mother    Hypertension Mother    Alzheimer's disease Mother    Hypertension Father  Brain cancer Sister    Breast cancer Sister    Hypertension Sister    Deep vein thrombosis Brother    Hypertension Brother    Colon cancer Neg Hx    Pancreatic disease Neg Hx    Esophageal cancer Neg Hx    Rectal cancer Neg Hx    Stomach cancer Neg Hx        Objective: Vitals:   05/25/22 1406  BP: (!) 148/57  Pulse: 76     Physical Exam  Latest Reference Range & Units 01/26/22 14:23  Prostatic Specific Antigen 0.00 - 4.00 ng/mL 72.30 (H)  (H): Data is abnormally high  Lab Results  Component Value Date   PSA1 382.0 (H) 01/12/2022   PSA1 66.7 (H) 11/22/2021   PSA1 53.2 (H) 11/17/2021    Lab Results:  No results found for this or any previous visit (from the past 24 hour(s)).    BMET No results for input(s): "NA", "K", "CL", "CO2", "GLUCOSE", "BUN", "CREATININE", "CALCIUM" in the last 72 hours. PSA PSA  Date Value Ref Range Status  05/20/2020 6.4 (H) < OR = 4.0 ng/mL Final    Comment:    The total PSA value from this assay system is  standardized against the WHO standard. The test  result will be approximately 20%  lower when compared  to the equimolar-standardized total PSA (Beckman  Coulter). Comparison of serial PSA results should be  interpreted with this fact in mind. . This test was performed using the Siemens  chemiluminescent method. Values obtained from  different assay methods cannot be used interchangeably. PSA levels, regardless of value, should not be interpreted as absolute evidence of the presence or absence of disease.   11/12/2014 2.0 0.0 - 4.0 ng/mL Final    Comment:    Roche ECLIA methodology. According to the American Urological Association, Serum PSA should decrease and remain at undetectable levels after radical prostatectomy. The AUA defines biochemical recurrence as an initial PSA value 0.2 ng/mL or greater followed by a subsequent confirmatory PSA value 0.2 ng/mL or greater. Values obtained with different assay methods or kits cannot be used interchangeably. Results cannot be interpreted as absolute evidence of the presence or absence of malignant disease.    Testosterone  Date Value Ref Range Status  01/12/2022 625 264 - 916 ng/dL Final    Comment:    Adult male reference interval is based on a population of healthy nonobese males (BMI <30) between 5 and 34 years old. [place], [place] [phone]. PMID: [id number].     UA has 0-2 RBC's  Studies/Results: No results found. DG Chest 2 View  Result Date: 03/28/2022 CLINICAL DATA:  Follow-up pneumonia.  Continued shortness of breath. EXAM: CHEST - 2 VIEW COMPARISON:  March 12, 2022 FINDINGS: The heart size and mediastinal contours are within normal limits. Both lungs are clear. Degenerative joint changes. IMPRESSION: No active cardiopulmonary disease. Electronically Signed   By: Abelardo Diesel M.D.   On: 03/28/2022 08:27   DG Chest Port 1 View  Result Date: 03/12/2022 CLINICAL DATA:  Questionable sepsis EXAM: PORTABLE CHEST 1 VIEW COMPARISON:  09/30/2020 FINDINGS: Heart is normal size. Patchy left  basilar atelectasis or infiltrate. Right lung clear. No effusions or acute bony abnormality. IMPRESSION: Patchy left lower lobe atelectasis or infiltrate/pneumonia. Electronically Signed   By: Rolm Baptise M.D.   On: 03/12/2022 02:39     Assessment & Plan: Prostate cancer metastatic to bone.   His PSA peaked at 382 on 2/2  but fell to 3.26 on 5/30 but was 3.57 on 05/22/22.He is doing well on firmagon and apalutamide and is tolerating both well.   He will continue monthly firmagon in this office and see me in 3 months.    Dr. Delton Coombes will be obtaining labs.   Back pain.  This has resolved.   He has some left lateral lower leg pain.   Nodular prostate with obstruction.  He is voiding ok on current therapy.       Meds ordered this encounter  Medications   degarelix (FIRMAGON) injection 80 mg      Orders Placed This Encounter  Procedures   Urinalysis, Routine w reflex microscopic      Return in about 3 months (around 08/25/2022) for continue monthly firmagon. .   CC: Dettinger, Fransisca Kaufmann, MD and Dr. Derek Jack.      Alan [name] 05/26/2022 Patient ID: Alan [name]., male   DOB: 12/17/1940, 81 y.o.   MRN: [id number]

## 2022-05-26 ENCOUNTER — Ambulatory Visit: Payer: No Typology Code available for payment source | Admitting: Family Medicine

## 2022-05-26 LAB — MICROSCOPIC EXAMINATION
Bacteria, UA: NONE SEEN
Epithelial Cells (non renal): NONE SEEN /hpf (ref 0–10)

## 2022-05-26 LAB — URINALYSIS, ROUTINE W REFLEX MICROSCOPIC
Bilirubin, UA: NEGATIVE
Glucose, UA: NEGATIVE
Ketones, UA: NEGATIVE
Leukocytes,UA: NEGATIVE
Nitrite, UA: NEGATIVE
RBC, UA: NEGATIVE
Specific Gravity, UA: 1.015 (ref 1.005–1.030)
Urobilinogen, Ur: 0.2 mg/dL (ref 0.2–1.0)
pH, UA: 7 (ref 5.0–7.5)

## 2022-05-29 ENCOUNTER — Telehealth: Payer: Self-pay | Admitting: Family Medicine

## 2022-05-29 ENCOUNTER — Encounter: Payer: Self-pay | Admitting: Family Medicine

## 2022-05-29 ENCOUNTER — Inpatient Hospital Stay (HOSPITAL_COMMUNITY): Payer: No Typology Code available for payment source

## 2022-05-29 ENCOUNTER — Ambulatory Visit (INDEPENDENT_AMBULATORY_CARE_PROVIDER_SITE_OTHER): Payer: No Typology Code available for payment source | Admitting: Family Medicine

## 2022-05-29 VITALS — BP 134/50 | HR 66 | Temp 98.3°F | Resp 18

## 2022-05-29 VITALS — BP 137/55 | HR 78 | Temp 98.0°F | Ht 65.0 in | Wt 143.2 lb

## 2022-05-29 DIAGNOSIS — C7951 Secondary malignant neoplasm of bone: Secondary | ICD-10-CM

## 2022-05-29 DIAGNOSIS — Z86718 Personal history of other venous thrombosis and embolism: Secondary | ICD-10-CM | POA: Diagnosis not present

## 2022-05-29 DIAGNOSIS — I152 Hypertension secondary to endocrine disorders: Secondary | ICD-10-CM

## 2022-05-29 DIAGNOSIS — I480 Paroxysmal atrial fibrillation: Secondary | ICD-10-CM | POA: Diagnosis not present

## 2022-05-29 DIAGNOSIS — C61 Malignant neoplasm of prostate: Secondary | ICD-10-CM | POA: Diagnosis not present

## 2022-05-29 DIAGNOSIS — E1159 Type 2 diabetes mellitus with other circulatory complications: Secondary | ICD-10-CM | POA: Diagnosis not present

## 2022-05-29 DIAGNOSIS — D6859 Other primary thrombophilia: Secondary | ICD-10-CM

## 2022-05-29 DIAGNOSIS — E119 Type 2 diabetes mellitus without complications: Secondary | ICD-10-CM | POA: Diagnosis not present

## 2022-05-29 DIAGNOSIS — E785 Hyperlipidemia, unspecified: Secondary | ICD-10-CM

## 2022-05-29 DIAGNOSIS — E1169 Type 2 diabetes mellitus with other specified complication: Secondary | ICD-10-CM | POA: Diagnosis not present

## 2022-05-29 DIAGNOSIS — Z7901 Long term (current) use of anticoagulants: Secondary | ICD-10-CM

## 2022-05-29 LAB — COAGUCHEK XS/INR WAIVED
INR: 1.6 — ABNORMAL HIGH (ref 0.9–1.1)
Prothrombin Time: 19.3 s

## 2022-05-29 LAB — BAYER DCA HB A1C WAIVED: HB A1C (BAYER DCA - WAIVED): 5.6 % (ref 4.8–5.6)

## 2022-05-29 MED ORDER — METFORMIN HCL 1000 MG PO TABS: 1000.0000 mg | ORAL_TABLET | Freq: Two times a day (BID) | ORAL | 3 refills | Status: DC

## 2022-05-29 MED ORDER — LORATADINE 10 MG PO TABS
10.0000 mg | ORAL_TABLET | Freq: Once | ORAL | Status: AC
  Administered 2022-05-29: 10 mg via ORAL
  Filled 2022-05-29: qty 1

## 2022-05-29 MED ORDER — SODIUM CHLORIDE 0.9 % IV SOLN: Freq: Once | INTRAVENOUS | Status: AC

## 2022-05-29 MED ORDER — SODIUM CHLORIDE 0.9 % IV SOLN
510.0000 mg | Freq: Once | INTRAVENOUS | Status: AC
  Administered 2022-05-29: 510 mg via INTRAVENOUS
  Filled 2022-05-29: qty 17

## 2022-05-29 MED ORDER — PRAVASTATIN SODIUM 40 MG PO TABS: 40.0000 mg | ORAL_TABLET | Freq: Every evening | ORAL | 3 refills | Status: DC

## 2022-05-29 MED ORDER — ACETAMINOPHEN 325 MG PO TABS
650.0000 mg | ORAL_TABLET | Freq: Once | ORAL | Status: AC
  Administered 2022-05-29: 650 mg via ORAL
  Filled 2022-05-29: qty 2

## 2022-05-29 NOTE — Progress Notes (Signed)
BP (!) 137/55   Pulse 78   Temp 98 F (36.7 C)   Ht '5\' 5"'  (1.651 m)   Wt 143 lb 3.2 oz (65 kg)   SpO2 99%   BMI 23.83 kg/m    Subjective:   Patient ID: Alan Netters., male    DOB: 16-Oct-1941, 81 y.o.   MRN: [id number]  HPI: Alan Rivenburg. is a 81 y.o. male presenting on 05/29/2022 for Medical Management of Chronic Issues, Diabetes, and Atrial Fibrillation   HPI Patient had a blood transfusion last week which is likely affecting both his INR and his A1c Type 2 diabetes mellitus Patient comes in today for recheck of his diabetes. Patient has been currently taking metformin. Patient is currently on an ACE inhibitor/ARB. Patient has not seen an ophthalmologist this year. Patient denies any issues with their feet. The symptom started onset as an adult Hypertension and hyperlipidemia ARE RELATED TO DM   Hypertension Patient is currently on Amlodipine and lisinopril and metoprolol, and their blood pressure today is 137/55. Patient denies any lightheadedness or dizziness. Patient denies headaches, blurred vision, chest pains, shortness of breath, or weakness. Denies any side effects from medication and is content with current medication.   Hyperlipidemia Patient is coming in for recheck of his hyperlipidemia. The patient is currently taking Pravastatin. They deny any issues with myalgias or history of liver damage from it. They deny any focal numbness or weakness or chest pain.   Coumadin recheck Target goal: 2.0-3.0 Reason on anticoagulation: A-fib with history of DVTs. Patient denies any bruising or bleeding or chest pain or palpitations   Relevant past medical, surgical, family and social history reviewed and updated as indicated. Interim medical history since our last visit reviewed. Allergies and medications reviewed and updated.  Review of Systems  Constitutional:  Negative for chills and fever.  Eyes:  Negative for visual disturbance.  Respiratory:  Negative for shortness of  breath and wheezing.   Cardiovascular:  Negative for chest pain and leg swelling.  Musculoskeletal:  Negative for back pain and gait problem.  Skin:  Negative for rash.  Neurological:  Negative for dizziness and light-headedness.  All other systems reviewed and are negative.   Per HPI unless specifically indicated above   Allergies as of 05/29/2022       Reactions   Allopurinol Other (See Comments)   Significantly decreased WBC's   Colchicine Other (See Comments)   Significantly decreased WBC's        Medication List        Accurate as of May 29, 2022 12:15 PM. If you have any questions, ask your nurse or doctor.          acetaminophen 500 MG tablet Commonly known as: TYLENOL Take 1,000 mg by mouth every 6 (six) hours as needed for moderate pain.   amLODipine 10 MG tablet Commonly known as: NORVASC Take 1 tablet (10 mg total) by mouth every evening.   CALCIUM 600 + D PO Take 1 tablet by mouth in the morning and at bedtime.   calcium carbonate 1500 (600 Ca) MG Tabs tablet Commonly known as: OSCAL Take by mouth 2 (two) times daily with a meal.   chlorthalidone 25 MG tablet Commonly known as: HYGROTON Take 1 tablet by mouth once daily   diclofenac Sodium 1 % Gel Commonly known as: VOLTAREN Apply 1 application topically 2 (two) times daily as needed (pain).   Erleada 60 MG tablet Generic drug: apalutamide Take 4 tablets (  240 mg total) by mouth daily. Take as directed. What changed: how much to take   fluticasone 50 MCG/ACT nasal spray Commonly known as: FLONASE Place 1 spray into both nostrils 2 (two) times daily as needed for allergies or rhinitis.   glucose blood test strip Commonly known as: ONE TOUCH ULTRA TEST Use to check BG once daily.  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 Can take every 4 hours if  needed.   lisinopril 40 MG tablet Commonly known as: ZESTRIL Take 1 tablet (40 mg total) by mouth daily.   magnesium oxide 400 (240 Mg) MG tablet Commonly known as: MAG-OX Take 1 tablet (400 mg total) by mouth daily.   metFORMIN 1000 MG tablet Commonly known as: GLUCOPHAGE Take 1 tablet (1,000 mg total) by mouth 2 (two) times daily with a meal.   methylPREDNISolone 4 MG Tbpk tablet Commonly known as: MEDROL DOSEPAK Take as directed   metoprolol succinate 100 MG 24 hr tablet Commonly known as: TOPROL-XL Take 1 tablet (100 mg total) by mouth daily. Take with or immediately following a meal.   omeprazole 20 MG capsule Commonly known as: PRILOSEC Take 1 capsule (20 mg total) by mouth daily.   ONE TOUCH ULTRA MINI w/Device Kit 1 each by Does not apply route 2 (two) times daily.   potassium chloride SA 20 MEQ tablet Commonly known as: KLOR-CON M TAKE 1  BY MOUTH ONCE DAILY What changed: See the new instructions.   pravastatin 40 MG tablet Commonly known as: PRAVACHOL Take 1 tablet (40 mg total) by mouth every evening.   SV Iron 325 (65 FE) MG tablet Generic drug: ferrous sulfate Take 1 tablet by mouth once daily with breakfast   warfarin 5 MG tablet Commonly known as: COUMADIN Take as directed by the anticoagulation clinic. If you are unsure how to take this medication, talk to your nurse or doctor. Original instructions: TAKE 1 & 1/2 (ONE & ONE-HALF) TABLETS BY MOUTH ONCE DAILY 6 IN THE EVENING         Objective:   BP (!) 137/55   Pulse 78   Temp 98 F (36.7 C)   Ht '5\' 5"'  (1.651 m)   Wt 143 lb 3.2 oz (65 kg)   SpO2 99%   BMI 23.83 kg/m   Wt Readings from Last 3 Encounters:  05/29/22 143 lb 3.2 oz (65 kg)  05/11/22 142 lb 8 oz (64.6 kg)  04/11/22 144 lb 9.6 oz (65.6 kg)    Physical Exam Vitals and nursing note reviewed.  Constitutional:      General: He is not in acute distress.    Appearance: He is well-developed. He is not diaphoretic.  Eyes:      General: No scleral icterus.    Conjunctiva/sclera: Conjunctivae normal.  Neck:     Thyroid: No thyromegaly.  Cardiovascular:     Rate and Rhythm: Normal rate and regular rhythm.     Heart sounds: Normal heart sounds. No murmur heard. Pulmonary:     Effort: Pulmonary effort is normal. No respiratory distress.     Breath sounds: Normal breath sounds. No wheezing.  Musculoskeletal:        General: No swelling. Normal range of motion.     Cervical back: Neck supple.  Lymphadenopathy:     Cervical: No cervical adenopathy.  Skin:    General:  Skin is warm and dry.     Findings: No rash.  Neurological:     Mental Status: He is alert and oriented to person, place, and time.     Coordination: Coordination normal.  Psychiatric:        Behavior: Behavior normal.       Assessment & Plan:   Problem List Items Addressed This Visit       Cardiovascular and Mediastinum   Hypertension associated with diabetes ([place]) - Primary   Relevant Medications   metFORMIN (GLUCOPHAGE) 1000 MG tablet   pravastatin (PRAVACHOL) 40 MG tablet   Other Relevant Orders   CBC with Differential/Platelet   Lipid panel   CoaguChek XS/INR Waived   Bayer DCA Hb A1c Waived   Paroxysmal atrial fibrillation (HCC)   Relevant Medications   pravastatin (PRAVACHOL) 40 MG tablet   Other Relevant Orders   CBC with Differential/Platelet   Lipid panel   CoaguChek XS/INR Waived   Bayer DCA Hb A1c Waived   CoaguChek XS/INR Waived     Endocrine   Hyperlipidemia associated with type 2 diabetes mellitus (HCC)   Relevant Medications   metFORMIN (GLUCOPHAGE) 1000 MG tablet   pravastatin (PRAVACHOL) 40 MG tablet   Other Relevant Orders   CBC with Differential/Platelet   Lipid panel   CoaguChek XS/INR Waived   Bayer DCA Hb A1c Waived   Non-insulin dependent type 2 diabetes mellitus (HCC)   Relevant Medications   metFORMIN (GLUCOPHAGE) 1000 MG tablet   pravastatin (PRAVACHOL) 40 MG tablet     Hematopoietic and  Hemostatic   Primary hypercoagulable state ([place]) [D68.59]     Other   Long term (current) use of anticoagulants [Z79.01]   History of DVT (deep vein thrombosis)   Other Visit Diagnoses     Type 2 diabetes mellitus with other specified complication, without long-term current use of insulin (HCC)       Relevant Medications   metFORMIN (GLUCOPHAGE) 1000 MG tablet   pravastatin (PRAVACHOL) 40 MG tablet   Other Relevant Orders   CBC with Differential/Platelet   Lipid panel   CoaguChek XS/INR Waived   Bayer DCA Hb A1c Waived     Patient had a blood transfusion last week which is likely affecting both his INR and A1c so we will make some minor adjustments that INR today but will not adjust his weekly dose. Patient is also going to get another transfusion today which will likely throw off his levels again. Description   Patient had a blood transfusion last week which is likely affecting both his INR and A1c so we will make some minor adjustments that INR today but will not adjust his weekly dose..  Take extra half a tablet today or 2 whole tablets and then return to take  1 1/2 tablets every day, this is what he was put on in the hospital  INR was 1.6 (goal 2-3)   Follow-up 3-4weeks      Follow up plan: Return if symptoms worsen or fail to improve, for 3 to 4-week INR recheck.  Counseling provided for all of the vaccine components Orders Placed This Encounter  Procedures   CBC with Differential/Platelet   Lipid panel   CoaguChek XS/INR Waived   Bayer [facility] Hb A1c Waived   CoaguChek XS/INR [place], MD [facility] 05/29/2022, 12:15 PM

## 2022-05-29 NOTE — Telephone Encounter (Signed)
Appointment is scheduled, patient is aware

## 2022-05-30 ENCOUNTER — Telehealth: Payer: Self-pay | Admitting: Emergency Medicine

## 2022-05-30 LAB — CBC WITH DIFFERENTIAL/PLATELET
Basophils Absolute: 0 10*3/uL (ref 0.0–0.2)
Basos: 0 %
EOS (ABSOLUTE): 0.1 10*3/uL (ref 0.0–0.4)
Eos: 2 %
Hematocrit: 26.7 % — ABNORMAL LOW (ref 37.5–51.0)
Hemoglobin: 8.5 g/dL — CL (ref 13.0–17.7)
Immature Grans (Abs): 0 10*3/uL (ref 0.0–0.1)
Immature Granulocytes: 0 %
Lymphocytes Absolute: 0.8 10*3/uL (ref 0.7–3.1)
Lymphs: 20 %
MCH: 28 pg (ref 26.6–33.0)
MCHC: 31.8 g/dL (ref 31.5–35.7)
MCV: 88 fL (ref 79–97)
Monocytes Absolute: 0.4 10*3/uL (ref 0.1–0.9)
Monocytes: 10 %
Neutrophils Absolute: 2.7 10*3/uL (ref 1.4–7.0)
Neutrophils: 68 %
Platelets: 195 10*3/uL (ref 150–450)
RBC: 3.04 x10E6/uL — ABNORMAL LOW (ref 4.14–5.80)
RDW: 17.5 % — ABNORMAL HIGH (ref 11.6–15.4)
WBC: 4 10*3/uL (ref 3.4–10.8)

## 2022-05-30 LAB — LIPID PANEL
Chol/HDL Ratio: 2.8 ratio (ref 0.0–5.0)
Cholesterol, Total: 94 mg/dL — ABNORMAL LOW (ref 100–199)
HDL: 33 mg/dL — ABNORMAL LOW (ref >39)
LDL Chol Calc (NIH): 42 mg/dL (ref 0–99)
Triglycerides: 99 mg/dL (ref 0–149)
VLDL Cholesterol Cal: 19 mg/dL (ref 5–40)

## 2022-05-30 NOTE — Telephone Encounter (Signed)
Patient informed. 

## 2022-05-30 NOTE — Telephone Encounter (Signed)
Covering for [place]  TC from [facility]  Hemoglobin 8.5

## 2022-05-31 NOTE — Progress Notes (Signed)
PATIENTS WIFE AWARE

## 2022-06-02 ENCOUNTER — Ambulatory Visit (HOSPITAL_COMMUNITY)
Admission: RE | Admit: 2022-06-02 | Discharge: 2022-06-02 | Disposition: A | Discharge status: Home / Self Care | Payer: No Typology Code available for payment source | Source: Ambulatory Visit | Attending: Gastroenterology | Admitting: Gastroenterology

## 2022-06-02 ENCOUNTER — Telehealth: Payer: Self-pay | Admitting: Family Medicine

## 2022-06-02 DIAGNOSIS — K449 Diaphragmatic hernia without obstruction or gangrene: Secondary | ICD-10-CM | POA: Diagnosis not present

## 2022-06-02 DIAGNOSIS — K869 Disease of pancreas, unspecified: Secondary | ICD-10-CM | POA: Insufficient documentation

## 2022-06-02 DIAGNOSIS — K4090 Unilateral inguinal hernia, without obstruction or gangrene, not specified as recurrent: Secondary | ICD-10-CM | POA: Diagnosis not present

## 2022-06-02 MED ORDER — SODIUM CHLORIDE (PF) 0.9 % IJ SOLN
INTRAMUSCULAR | Status: AC
  Filled 2022-06-02: qty 50

## 2022-06-02 MED ORDER — IOHEXOL 300 MG/ML  SOLN
100.0000 mL | Freq: Once | INTRAMUSCULAR | Status: AC | PRN
  Administered 2022-06-02: 100 mL via INTRAVENOUS

## 2022-06-02 NOTE — Telephone Encounter (Signed)
Patient's hemoglobin is still down slightly at 8.5 but it is improved from where it was last time, we should recheck this at his next visit.  Continue to watch for any signs of blood in his stool.  Patient's cholesterol looks good.  His A1c looks good as well.  Per your lab note states we need to check it at his next appointment.

## 2022-06-02 NOTE — Telephone Encounter (Signed)
Yes I agree, he does not need to come in sooner, if this can wait until his next INR and we will check it then.

## 2022-06-09 ENCOUNTER — Inpatient Hospital Stay (HOSPITAL_COMMUNITY): Payer: No Typology Code available for payment source

## 2022-06-09 ENCOUNTER — Encounter (HOSPITAL_COMMUNITY): Payer: Self-pay

## 2022-06-09 VITALS — BP 146/68 | HR 61 | Temp 98.0°F | Resp 16

## 2022-06-09 DIAGNOSIS — C61 Malignant neoplasm of prostate: Secondary | ICD-10-CM | POA: Diagnosis not present

## 2022-06-09 DIAGNOSIS — C7951 Secondary malignant neoplasm of bone: Secondary | ICD-10-CM

## 2022-06-09 LAB — COMPREHENSIVE METABOLIC PANEL
ALT: 17 U/L (ref 0–44)
AST: 23 U/L (ref 15–41)
Albumin: 3.3 g/dL — ABNORMAL LOW (ref 3.5–5.0)
Alkaline Phosphatase: 41 U/L (ref 38–126)
Anion gap: 12 (ref 5–15)
BUN: 19 mg/dL (ref 8–23)
CO2: 25 mmol/L (ref 22–32)
Calcium: 9.3 mg/dL (ref 8.9–10.3)
Chloride: 100 mmol/L (ref 98–111)
Creatinine, Ser: 1.03 mg/dL (ref 0.61–1.24)
GFR, Estimated: >60 mL/min (ref >60)
Glucose, Bld: 125 mg/dL — ABNORMAL HIGH (ref 70–99)
Potassium: 3.8 mmol/L (ref 3.5–5.1)
Sodium: 137 mmol/L (ref 135–145)
Total Bilirubin: 0.5 mg/dL (ref 0.3–1.2)
Total Protein: 6.3 g/dL — ABNORMAL LOW (ref 6.5–8.1)

## 2022-06-09 MED ORDER — DENOSUMAB 120 MG/1.7ML ~~LOC~~ SOLN
120.0000 mg | Freq: Once | SUBCUTANEOUS | Status: AC
  Administered 2022-06-09: 120 mg via SUBCUTANEOUS
  Filled 2022-06-09: qty 1.7

## 2022-06-09 NOTE — Progress Notes (Signed)
[name] presents today for injection per the provider's orders. Calcium 9.3. Patient taking calcium supplements as prescribed.  Xgeva administration without incident; injection site WNL; see MAR for injection details.  Patient tolerated procedure well and without incident.  No questions or complaints noted at this time.   Discharged from clinic by wheel chair accompanied by his wife in stable condition. Alert and oriented x 3. F/U with [facility] as scheduled.

## 2022-06-09 NOTE — Patient Instructions (Signed)
[facility]  Discharge Instructions: Thank you for choosing [facility] to provide your oncology and hematology care.  If you have a lab appointment with the [facility], please come in thru the Main Entrance and check in at the main information desk.  Wear comfortable clothing and clothing appropriate for easy access to any Portacath or PICC line.   We strive to give you quality time with your provider. You may need to reschedule your appointment if you arrive late (15 or more minutes).  Arriving late affects you and other patients whose appointments are after yours.  Also, if you miss three or more appointments without notifying the office, you may be dismissed from the clinic at the provider's discretion.      For prescription refill requests, have your pharmacy contact our office and allow 72 hours for refills to be completed.    Today you received the following chemotherapy and/or immunotherapy agents Xgeva injection.      To help prevent nausea and vomiting after your treatment, we encourage you to take your nausea medication as directed.  BELOW ARE SYMPTOMS THAT SHOULD BE REPORTED IMMEDIATELY: *FEVER GREATER THAN 100.4 F (38 C) OR HIGHER *CHILLS OR SWEATING *NAUSEA AND VOMITING THAT IS NOT CONTROLLED WITH YOUR NAUSEA MEDICATION *UNUSUAL SHORTNESS OF BREATH *UNUSUAL BRUISING OR BLEEDING *URINARY PROBLEMS (pain or burning when urinating, or frequent urination) *BOWEL PROBLEMS (unusual diarrhea, constipation, pain near the anus) TENDERNESS IN MOUTH AND THROAT WITH OR WITHOUT PRESENCE OF ULCERS (sore throat, sores in mouth, or a toothache) UNUSUAL RASH, SWELLING OR PAIN  UNUSUAL VAGINAL DISCHARGE OR ITCHING   Items with * indicate a potential emergency and should be followed up as soon as possible or go to the Emergency Department if any problems should occur.  Please show the CHEMOTHERAPY ALERT CARD or IMMUNOTHERAPY ALERT CARD at check-in to the Emergency  Department and triage nurse.  Should you have questions after your visit or need to cancel or reschedule your appointment, please contact [facility] [phone]  and follow the prompts.  Office hours are 8:00 a.m. to 4:30 p.m. Monday - Friday. Please note that voicemails left after 4:00 p.m. may not be returned until the following business day.  We are closed weekends and major holidays. You have access to a nurse at all times for urgent questions. Please call the main number to the clinic [phone] and follow the prompts.  For any non-urgent questions, you may also contact your provider using MyChart. We now offer e-Visits for anyone 70 and older to request care online for non-urgent symptoms. For details visit [email or web address].   Also download the MyChart app! Go to the app store, search "MyChart", open the app, select [facility], and log in with your MyChart username and password.  Masks are optional in the cancer centers. If you would like for your care team to wear a mask while they are taking care of you, please let them know. For doctor visits, patients may have with them one support person who is at least 79 years old. At this time, visitors are not allowed in the infusion area.

## 2022-06-21 ENCOUNTER — Ambulatory Visit: Payer: Self-pay | Admitting: *Deleted

## 2022-06-21 ENCOUNTER — Other Ambulatory Visit (HOSPITAL_COMMUNITY): Payer: Self-pay

## 2022-06-21 DIAGNOSIS — D649 Anemia, unspecified: Secondary | ICD-10-CM

## 2022-06-21 DIAGNOSIS — E1169 Type 2 diabetes mellitus with other specified complication: Secondary | ICD-10-CM

## 2022-06-21 DIAGNOSIS — I480 Paroxysmal atrial fibrillation: Secondary | ICD-10-CM

## 2022-06-21 NOTE — Chronic Care Management (AMB) (Signed)
  Chronic Care Management   Note  06/21/2022 Name: Alan [name]. MRN: [id number] DOB: 04-26-1941   Patient is stable from RN Care Management perspective or has not recently engaged with the [facility]. I am removing RN Care Manager from Care Team and closing [facility]. If patient is currently engaged with another CCM team member I will forward this encounter to inform them of my case closure. Patient may be eligible for re-engagement with RN Care Manager in the future if necessary and can discuss this with their PCP.  Chong Sicilian, BSN, RN-BC Embedded Chronic Care Manager [facility] Direct Dial: [phone]

## 2022-06-21 NOTE — Patient Instructions (Signed)
[name]  I have enjoyed working with you through the Chronic Care Management Program at [facility]. Due to program changes I am removing myself from your care team because you've either met our goals, your conditions are stable and no longer require care management, or we haven't engaged within the past 6 months. If you are currently active with another CCM Team Member, you will remain active with them unless they reach out to you with additional information. If you feel that you need RN Care Management services in the future, please talk with your primary care provider to discuss re-engagement with the RN Care Manager that will be assigned to [facility]. This does not affect your status as a patient at [facility].   Thank you for allowing me to participate in your your healthcare journey.  Chong Sicilian, BSN, RN-BC Embedded Chronic Care Manager [facility] Direct Dial: [phone]

## 2022-06-22 ENCOUNTER — Inpatient Hospital Stay (HOSPITAL_COMMUNITY): Payer: No Typology Code available for payment source | Attending: Hematology | Admitting: Hematology

## 2022-06-22 ENCOUNTER — Encounter (HOSPITAL_COMMUNITY): Payer: Self-pay | Admitting: Hematology

## 2022-06-22 ENCOUNTER — Inpatient Hospital Stay (HOSPITAL_COMMUNITY): Payer: No Typology Code available for payment source

## 2022-06-22 VITALS — BP 143/60 | HR 57 | Temp 97.8°F | Resp 16 | Wt 146.0 lb

## 2022-06-22 DIAGNOSIS — K5730 Diverticulosis of large intestine without perforation or abscess without bleeding: Secondary | ICD-10-CM | POA: Insufficient documentation

## 2022-06-22 DIAGNOSIS — E782 Mixed hyperlipidemia: Secondary | ICD-10-CM | POA: Insufficient documentation

## 2022-06-22 DIAGNOSIS — Z8719 Personal history of other diseases of the digestive system: Secondary | ICD-10-CM | POA: Diagnosis not present

## 2022-06-22 DIAGNOSIS — C61 Malignant neoplasm of prostate: Secondary | ICD-10-CM

## 2022-06-22 DIAGNOSIS — R21 Rash and other nonspecific skin eruption: Secondary | ICD-10-CM | POA: Insufficient documentation

## 2022-06-22 DIAGNOSIS — Z9049 Acquired absence of other specified parts of digestive tract: Secondary | ICD-10-CM | POA: Insufficient documentation

## 2022-06-22 DIAGNOSIS — Z87891 Personal history of nicotine dependence: Secondary | ICD-10-CM | POA: Diagnosis not present

## 2022-06-22 DIAGNOSIS — I81 Portal vein thrombosis: Secondary | ICD-10-CM | POA: Diagnosis not present

## 2022-06-22 DIAGNOSIS — R0602 Shortness of breath: Secondary | ICD-10-CM | POA: Insufficient documentation

## 2022-06-22 DIAGNOSIS — Z8701 Personal history of pneumonia (recurrent): Secondary | ICD-10-CM | POA: Diagnosis not present

## 2022-06-22 DIAGNOSIS — Z86718 Personal history of other venous thrombosis and embolism: Secondary | ICD-10-CM | POA: Diagnosis not present

## 2022-06-22 DIAGNOSIS — I480 Paroxysmal atrial fibrillation: Secondary | ICD-10-CM | POA: Diagnosis not present

## 2022-06-22 DIAGNOSIS — E119 Type 2 diabetes mellitus without complications: Secondary | ICD-10-CM | POA: Diagnosis not present

## 2022-06-22 DIAGNOSIS — K449 Diaphragmatic hernia without obstruction or gangrene: Secondary | ICD-10-CM | POA: Insufficient documentation

## 2022-06-22 DIAGNOSIS — K219 Gastro-esophageal reflux disease without esophagitis: Secondary | ICD-10-CM | POA: Insufficient documentation

## 2022-06-22 DIAGNOSIS — D649 Anemia, unspecified: Secondary | ICD-10-CM

## 2022-06-22 DIAGNOSIS — I10 Essential (primary) hypertension: Secondary | ICD-10-CM | POA: Diagnosis not present

## 2022-06-22 DIAGNOSIS — Z7901 Long term (current) use of anticoagulants: Secondary | ICD-10-CM | POA: Insufficient documentation

## 2022-06-22 DIAGNOSIS — I700 Atherosclerosis of aorta: Secondary | ICD-10-CM | POA: Diagnosis not present

## 2022-06-22 DIAGNOSIS — C7951 Secondary malignant neoplasm of bone: Secondary | ICD-10-CM | POA: Insufficient documentation

## 2022-06-22 DIAGNOSIS — Z79899 Other long term (current) drug therapy: Secondary | ICD-10-CM | POA: Insufficient documentation

## 2022-06-22 DIAGNOSIS — Z7952 Long term (current) use of systemic steroids: Secondary | ICD-10-CM | POA: Diagnosis not present

## 2022-06-22 DIAGNOSIS — Z86010 Personal history of colonic polyps: Secondary | ICD-10-CM | POA: Insufficient documentation

## 2022-06-22 DIAGNOSIS — I422 Other hypertrophic cardiomyopathy: Secondary | ICD-10-CM | POA: Insufficient documentation

## 2022-06-22 DIAGNOSIS — R059 Cough, unspecified: Secondary | ICD-10-CM | POA: Diagnosis not present

## 2022-06-22 LAB — CBC WITH DIFFERENTIAL/PLATELET
Abs Immature Granulocytes: 0.01 10*3/uL (ref 0.00–0.07)
Basophils Absolute: 0 10*3/uL (ref 0.0–0.1)
Basophils Relative: 1 %
Eosinophils Absolute: 0.2 10*3/uL (ref 0.0–0.5)
Eosinophils Relative: 4 %
HCT: 29.4 % — ABNORMAL LOW (ref 39.0–52.0)
Hemoglobin: 9.4 g/dL — ABNORMAL LOW (ref 13.0–17.0)
Immature Granulocytes: 0 %
Lymphocytes Relative: 21 %
Lymphs Abs: 0.8 10*3/uL (ref 0.7–4.0)
MCH: 30.4 pg (ref 26.0–34.0)
MCHC: 32 g/dL (ref 30.0–36.0)
MCV: 95.1 fL (ref 80.0–100.0)
Monocytes Absolute: 0.5 10*3/uL (ref 0.1–1.0)
Monocytes Relative: 13 %
Neutro Abs: 2.3 10*3/uL (ref 1.7–7.7)
Neutrophils Relative %: 61 %
Platelets: 192 10*3/uL (ref 150–400)
RBC: 3.09 MIL/uL — ABNORMAL LOW (ref 4.22–5.81)
RDW: 18.9 % — ABNORMAL HIGH (ref 11.5–15.5)
WBC: 3.8 10*3/uL — ABNORMAL LOW (ref 4.0–10.5)
nRBC: 0 % (ref 0.0–0.2)

## 2022-06-22 LAB — COMPREHENSIVE METABOLIC PANEL
ALT: 11 U/L (ref 0–44)
AST: 18 U/L (ref 15–41)
Albumin: 3.4 g/dL — ABNORMAL LOW (ref 3.5–5.0)
Alkaline Phosphatase: 38 U/L (ref 38–126)
Anion gap: 9 (ref 5–15)
BUN: 18 mg/dL (ref 8–23)
CO2: 26 mmol/L (ref 22–32)
Calcium: 9.3 mg/dL (ref 8.9–10.3)
Chloride: 100 mmol/L (ref 98–111)
Creatinine, Ser: 1 mg/dL (ref 0.61–1.24)
GFR, Estimated: >60 mL/min (ref >60)
Glucose, Bld: 172 mg/dL — ABNORMAL HIGH (ref 70–99)
Potassium: 3.9 mmol/L (ref 3.5–5.1)
Sodium: 135 mmol/L (ref 135–145)
Total Bilirubin: 0.3 mg/dL (ref 0.3–1.2)
Total Protein: 6.2 g/dL — ABNORMAL LOW (ref 6.5–8.1)

## 2022-06-22 LAB — IRON AND TIBC
Iron: 46 ug/dL (ref 45–182)
Saturation Ratios: 18 % (ref 17.9–39.5)
TIBC: 262 ug/dL (ref 250–450)
UIBC: 216 ug/dL

## 2022-06-22 LAB — FERRITIN: Ferritin: 79 ng/mL (ref 24–336)

## 2022-06-22 LAB — PSA: Prostatic Specific Antigen: 5.43 ng/mL — ABNORMAL HIGH (ref 0.00–4.00)

## 2022-06-22 NOTE — Patient Instructions (Signed)
[facility] at [facility] Discharge Instructions  You were seen and examined today by Dr. Delton Coombes.  Dr. Delton Coombes discussed your most recent lab work and CT scan which revealed that everything looks good. PSA results are not back yet though.  Follow-up as scheduled in 6 weeks.    Thank you for choosing [facility] at [facility] to provide your oncology and hematology care.  To afford each patient quality time with our provider, please arrive at least 15 minutes before your scheduled appointment time.   If you have a lab appointment with the [facility] please come in thru the Main Entrance and check in at the main information desk.  You need to re-schedule your appointment should you arrive 10 or more minutes late.  We strive to give you quality time with our providers, and arriving late affects you and other patients whose appointments are after yours.  Also, if you no show three or more times for appointments you may be dismissed from the clinic at the providers discretion.     Again, thank you for choosing [facility].  Our hope is that these requests will decrease the amount of time that you wait before being seen by our physicians.       _____________________________________________________________  Should you have questions after your visit to [facility], please contact our office at [phone] and follow the prompts.  Our office hours are 8:00 a.m. and 4:30 p.m. Monday - Friday.  Please note that voicemails left after 4:00 p.m. may not be returned until the following business day.  We are closed weekends and major holidays.  You do have access to a nurse 24-7, just call the main number to the clinic [phone] and do not press any options, hold on the line and a nurse will answer the phone.    For prescription refill requests, have your pharmacy contact our office and allow 72 hours.    Due to Covid,  you will need to wear a mask upon entering the hospital. If you do not have a mask, a mask will be given to you at the Main Entrance upon arrival. For doctor visits, patients may have 1 support person age 65 or older with them. For treatment visits, patients can not have anyone with them due to social distancing guidelines and our immunocompromised population.

## 2022-06-22 NOTE — Progress Notes (Signed)
[facility] [address], [place] [address]   CLINIC:  Medical Oncology/Hematology  PCP:  Dettinger, Elige Radon, MD [address] 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  PRIOR THERAPY: none  NGS Results: not done  CURRENT THERAPY: Erleada and Firmagon  BRIEF ONCOLOGIC HISTORY:  Oncology History  Prostate cancer (HCC)  04/13/2014 Initial Diagnosis   Prostate cancer (HCC)   06/25/2020 Cancer Staging   Staging form: Prostate, AJCC 8th Edition - Clinical stage from 06/25/2020: Stage IIC (cT2b, cN0, cM0, PSA: 12.8, Grade Group: 3) - Signed by Marcello Fennel, PA-C on 09/29/2020     CANCER STAGING: Cancer Staging  Prostate cancer [facility]) Staging form: Prostate, AJCC 8th Edition - Clinical stage from 06/25/2020: Stage IIC (cT2b, cN0, cM0, PSA: 12.8, Grade Group: 3) - Signed by Marcello Fennel, PA-C on 09/29/2020 - Pathologic stage from 01/26/2022: Stage IVB (pT2, pN0, pM1b, Grade Group: 3) - Unsigned   INTERVAL HISTORY:  Mr. Alonso Gapinski., a 81 y.o. 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 Barron was last seen on 05/11/2022.   Today he reports feeling good. He reports rash bilaterally on his thighs and shins which itches occasionally. He reports this rash is tolerable and does not disrupt his sleep. He denies current bleeding.   REVIEW OF SYSTEMS:  Review of Systems  Constitutional:  Negative for appetite change and fatigue.  HENT:   Negative for nosebleeds.   Respiratory:  Positive for cough and shortness of breath. Negative for hemoptysis.   Gastrointestinal:  Positive for diarrhea. Negative for blood in stool.  Genitourinary:  Negative for hematuria.   Skin:  Positive for rash.  Neurological:  Positive for numbness (L leg).  Hematological:  Does not bruise/bleed easily.  Psychiatric/Behavioral:  Negative for sleep disturbance.   All other systems reviewed and  are negative.   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   found during hospital stay with pancreatitis,  started coumadin;   last Abd CT in epic 07-29-2020  chronic thrombus portal venous   GERD (gastroesophageal reflux disease)    Heart murmur    History of acute pancreatitis    2016   necrotizing pancreatitis   History of adenomatous polyp of colon    History of DVT of lower extremity    History of GI bleed 08/2019   upper gi bleed due to duodenal ulcer   History of gout yrs ago   Hypertension    followed by pcp   Hypertrophic cardiomyopathy (HCC)    followed by cardiology   Internal hemorrhoids    Mixed hyperlipidemia    Normocytic anemia    PAF (paroxysmal atrial fibrillation) (HCC) 04/2016   cardiologist-- dr h. Katrinka Blazing--  event monitor 07-04-2017 epic, NSR/ PAflutter with occasional RVR/  PACs/ PVCs ;  echo 05-16-2017 epic,  moderate LVH with severe asymptomic septal hypertrophy, G1DD, EF 60-65%,  mild AR, mild LAE   Pancreatic pseudocyst    Peyronie's disease    Prostate cancer [facility]) urologist-- dr wrenn/ oncologist-- dr Kathrynn Running   first dx 08/ 2009 Gleason  3+3 active survelliance;  until bx 07/ 2021  Stage T2b, Gleason 4+3   Right inguinal hernia    Type 2 diabetes mellitus ([place])    followed by pcp---  (11-03-2020 per pt currently not checking blood sugar due to glucose monitor broken)   Wears glasses    Wears partial dentures    upper and lower   Past Surgical History:  Procedure Laterality Date   BIOPSY  02/09/2022   Procedure: BIOPSY;  Surgeon: Milus Banister, MD;  Location: [facility];  Service: Endoscopy;;   CHOLECYSTECTOMY N/A 11/26/2015   Procedure: LAPAROSCOPIC CHOLECYSTECTOMY;   Surgeon: Aviva Signs, MD;  Location: [facility];  Service: General;  Laterality: N/A;   COLONOSCOPY  last one 09-06-2020  dr stark   CYSTOSCOPY N/A 11/09/2020   Procedure: Erlene Quan;  Surgeon: Irine Seal, MD;  Location: [facility];  Service: Urology;  Laterality: N/A;   ESOPHAGOGASTRODUODENOSCOPY  10-06-2019  $RemoveBef'@WFB'[id number]$    ESOPHAGOGASTRODUODENOSCOPY N/A 02/09/2022   Procedure: ESOPHAGOGASTRODUODENOSCOPY (EGD);  Surgeon: Milus Banister, MD;  Location: [facility];  Service: Endoscopy;  Laterality: N/A;   EUS N/A 02/09/2022   Procedure: UPPER ENDOSCOPIC ULTRASOUND (EUS) RADIAL;  Surgeon: Milus Banister, MD;  Location: [facility];  Service: Endoscopy;  Laterality: N/A;   FLEXIBLE BRONCHOSCOPY Bilateral 04/14/2016   Procedure: FLEXIBLE BRONCHOSCOPY;  Surgeon: Sinda Du, MD;  [facility]: [facility];  Service: Cardiopulmonary;  Laterality: Bilateral;   HEMORRHOID SURGERY  yrs ago   RADIOACTIVE SEED IMPLANT N/A 11/09/2020   Procedure: RADIOACTIVE SEED IMPLANT/BRACHYTHERAPY IMPLANT;  Surgeon: Irine Seal, MD;  Location[facility];  Service: Urology;  Laterality: N/A;   SPACE OAR INSTILLATION N/A 11/09/2020   Procedure: SPACE OAR INSTILLATION;  Surgeon: Irine Seal, MD;  Location: [facility];  Service: Urology;  Laterality: N/A;   UMBILICAL HERNIA REPAIR N/A 11/26/2015   Procedure: UMBILICAL HERNIORRHAPHY;  Surgeon: Aviva Signs, MD;  Location: [facility];  Service: General;  Laterality: N/A;    SOCIAL HISTORY:  Social History   Socioeconomic History   Marital status: Married    Spouse name: [name]   Number of children: 4   Years of education: Not on file   Highest education level: 10th grade  Occupational History   Occupation: Retired     Fish farm manager: [employer]  Tobacco Use   Smoking status: Former    Years: 5.00    Types: Cigarettes    Quit date: 12/11/1986    Years since quitting: 35.5   Smokeless tobacco: Never  Vaping Use   Vaping  Use: Never used  Substance and Sexual Activity   Alcohol use: No   Drug use: Never   Sexual activity: Yes  Other Topics Concern   Not on file  Social History Narrative   Not on file   Social Determinants of Health   Financial Resource Strain: Low Risk  (08/12/2021)   Overall Financial Resource Strain (CARDIA)    Difficulty of Paying Living Expenses: Not hard at all  Food Insecurity: No Food Insecurity (08/12/2021)   Hunger Vital Sign    Worried About Running Out of Food in the Last Year: Never true    [place] in the Last Year: Never true  Transportation Needs: No Transportation Needs (08/12/2021)   PRAPARE - Hydrologist (Medical): No    Lack of Transportation (Non-Medical): No  Physical Activity: Inactive (10/25/2021)   Exercise Vital Sign    Days of Exercise  per Week: 0 days    Minutes of Exercise per Session: 0 min  Stress: Stress Concern Present (10/25/2021)   [place]    Feeling of Stress : To some extent  Social Connections: Socially Integrated (08/12/2021)   Social Connection and Isolation Panel [NHANES]    Frequency of Communication with Friends and Family: Three times a week    Frequency of Social Gatherings with Friends and Family: Three times a week    Attends Religious Services: More than 4 times per year    Active Member of Clubs or Organizations: Yes    Attends Archivist Meetings: More than 4 times per year    Marital Status: Married  Human resources officer Violence: Not At Risk (08/12/2021)   Humiliation, Afraid, Rape, and Kick questionnaire    Fear of Current or Ex-Partner: No    Emotionally Abused: No    Physically Abused: No    Sexually Abused: No    FAMILY HISTORY:  Family History  Problem Relation Age of Onset   Stroke Mother    Hypertension Mother    Alzheimer's disease Mother    Hypertension Father    Brain cancer Sister    Breast cancer Sister     Hypertension Sister    Deep vein thrombosis Brother    Hypertension Brother    Colon cancer Neg Hx    Pancreatic disease Neg Hx    Esophageal cancer Neg Hx    Rectal cancer Neg Hx    Stomach cancer Neg Hx     CURRENT MEDICATIONS:  Current Outpatient Medications  Medication Sig Dispense Refill   acetaminophen (TYLENOL) 500 MG tablet Take 1,000 mg by mouth every 6 (six) hours as needed for moderate pain.     amLODipine (NORVASC) 10 MG tablet Take 1 tablet (10 mg total) by mouth every evening. 90 tablet 3   apalutamide (ERLEADA) 60 MG tablet Take 4 tablets (240 mg total) by mouth daily. Take as directed. (Patient taking differently: Take 180 mg by mouth daily. Take as directed.) 120 tablet 0   Blood Glucose Monitoring Suppl (ONE TOUCH ULTRA MINI) w/Device KIT 1 each by Does not apply route 2 (two) times daily. 1 kit 1   Calcium Carb-Cholecalciferol (CALCIUM 600 + D PO) Take 1 tablet by mouth in the morning and at bedtime.     calcium carbonate (OSCAL) 1500 (600 Ca) MG TABS tablet Take by mouth 2 (two) times daily with a meal.     chlorthalidone (HYGROTON) 25 MG tablet Take 1 tablet by mouth once daily 90 tablet 3   diclofenac Sodium (VOLTAREN) 1 % GEL Apply 1 application topically 2 (two) times daily as needed (pain).     fluticasone (FLONASE) 50 MCG/ACT nasal spray Place 1 spray into both nostrils 2 (two) times daily as needed for allergies or rhinitis. 16 g 6   glucose blood (ONE TOUCH ULTRA TEST) test strip Use to check BG once daily.  Dx:  Type 2 DM controlled E11.9 100 each 4   HYDROcodone-acetaminophen (NORCO/VICODIN) 5-325 MG tablet Take 1 tablet by mouth every 6 (six) hours as needed for moderate pain. Can take every 4 hours if needed. 30 tablet 0   lisinopril (ZESTRIL) 40 MG tablet Take 1 tablet (40 mg total) by mouth daily. 90 tablet 1   magnesium oxide (MAG-OX) 400 (240 Mg) MG tablet Take 1 tablet (400 mg total) by mouth daily. 90 tablet 6   metFORMIN (GLUCOPHAGE) 1000  MG tablet  Take 1 tablet (1,000 mg total) by mouth 2 (two) times daily with a meal. 180 tablet 3   methylPREDNISolone (MEDROL DOSEPAK) 4 MG TBPK tablet Take as directed 21 tablet 0   metoprolol succinate (TOPROL-XL) 100 MG 24 hr tablet Take 1 tablet (100 mg total) by mouth daily. Take with or immediately following a meal. 90 tablet 3   omeprazole (PRILOSEC) 20 MG capsule Take 1 capsule (20 mg total) by mouth daily. 90 capsule 3   potassium chloride SA (KLOR-CON M) 20 MEQ tablet TAKE 1  BY MOUTH ONCE DAILY (Patient taking differently: Take 20 mEq by mouth daily.) 90 tablet 1   pravastatin (PRAVACHOL) 40 MG tablet Take 1 tablet (40 mg total) by mouth every evening. 90 tablet 3   SV IRON 325 MG tablet Take 1 tablet by mouth once daily with breakfast 90 tablet 0   warfarin (COUMADIN) 5 MG tablet TAKE 1 & 1/2 (ONE & ONE-HALF) TABLETS BY MOUTH ONCE DAILY 6 IN THE EVENING 90 tablet 0   No current facility-administered medications for this visit.    ALLERGIES:  Allergies  Allergen Reactions   Allopurinol Other (See Comments)    Significantly decreased WBC's   Colchicine Other (See Comments)    Significantly decreased WBC's    PHYSICAL EXAM:  Performance status (ECOG): 1 - Symptomatic but completely ambulatory  There were no vitals filed for this visit. Wt Readings from Last 3 Encounters:  05/29/22 143 lb 3.2 oz (65 kg)  05/11/22 142 lb 8 oz (64.6 kg)  04/11/22 144 lb 9.6 oz (65.6 kg)   Physical Exam Vitals reviewed.  Constitutional:      Appearance: Normal appearance.     Comments: In wheelchair  Cardiovascular:     Rate and Rhythm: Normal rate and regular rhythm.     Pulses: Normal pulses.     Heart sounds: Normal heart sounds.  Pulmonary:     Effort: Pulmonary effort is normal.     Breath sounds: Normal breath sounds.  Skin:    Findings: Erythema (lesions on thighs bilaterally), lesion (Hyperpigmented prodominently on thighs) and rash (Hyperpigmented lesions prodominently on thighs)  present. Rash is macular and papular. Rash is not scaling.  Neurological:     General: No focal deficit present.     Mental Status: He is alert and oriented to person, place, and time.  Psychiatric:        Mood and Affect: Mood normal.        Behavior: Behavior normal.      LABORATORY DATA:  I have reviewed the labs as listed.     Latest Ref Rng & Units 06/22/2022    2:18 PM 05/29/2022   11:19 AM 05/22/2022   12:08 PM  CBC  WBC 4.0 - 10.5 K/uL 3.8  4.0  3.5   Hemoglobin 13.0 - 17.0 g/dL 9.4  8.5  7.8   Hematocrit 39.0 - 52.0 % 29.4  26.7  24.4   Platelets 150 - 400 K/uL 192  195  236       Latest Ref Rng & Units 06/09/2022   10:25 AM 05/22/2022   12:08 PM 05/09/2022   12:48 PM  CMP  Glucose 70 - 99 mg/dL 125  202  116   BUN 8 - 23 mg/dL $Remove'19  22  28   'BpKkBGp$ Creatinine 0.61 - 1.24 mg/dL 1.03  1.06  1.08   Sodium 135 - 145 mmol/L 137  136  136   Potassium 3.5 -  5.1 mmol/L 3.8  3.7  4.5   Chloride 98 - 111 mmol/L 100  100  100   CO2 22 - 32 mmol/L $RemoveB'25  26  24   'KTFxAZfl$ Calcium 8.9 - 10.3 mg/dL 9.3  9.3  9.6   Total Protein 6.5 - 8.1 g/dL 6.3  6.2  6.9   Total Bilirubin 0.3 - 1.2 mg/dL 0.5  0.6  0.3   Alkaline Phos 38 - 126 U/L 41  43  49   AST 15 - 41 U/L $Remo'23  22  20   'TTRID$ ALT 0 - 44 U/L $Remo'17  10  9     'zGJEy$ DIAGNOSTIC IMAGING:  I have independently reviewed the scans and discussed with the patient. CT ABDOMEN PELVIS W WO CONTRAST  Result Date: 06/04/2022 CLINICAL DATA:  Pancreatic lesion, metastatic prostate cancer * Tracking Code: BO * EXAM: CT ABDOMEN AND PELVIS WITHOUT AND WITH CONTRAST TECHNIQUE: Multidetector CT imaging of the abdomen and pelvis was performed following the standard protocol before and following the bolus administration of intravenous contrast. RADIATION DOSE REDUCTION: This exam was performed according to the departmental dose-optimization program which includes automated exposure control, adjustment of the mA and/or kV according to patient size and/or use of iterative reconstruction  technique. CONTRAST:  119mL OMNIPAQUE IOHEXOL 300 MG/ML SOLN additional oral enteric contrast COMPARISON:  PET-CT, 01/27/2022, MR abdomen, 12/08/2021, CT abdomen pelvis, 11/25/2021 FINDINGS: Lower chest: No acute abnormality.  Small hiatal hernia. Hepatobiliary: No focal liver abnormality is seen. Status post cholecystectomy. Mild postoperative biliary dilatation. Pancreas: Unchanged somewhat ill-defined, matted appearing soft tissue interposed between the celiac axis, gastric fundus, and superior pancreatic body measuring approximately 3.9 x 2.0 cm (series 4, image 19). Mildly atrophic appearance of the pancreatic parenchyma with prominence of the pancreatic duct in the pancreatic neck and body measuring up to 0.6 cm (series 4, image 27). No acute inflammatory findings. Spleen: Normal in size without significant abnormality. Adrenals/Urinary Tract: Adrenal glands are unremarkable. Kidneys are normal, without renal calculi, solid lesion, or hydronephrosis. Bladder is unremarkable. Stomach/Bowel: Stomach is within normal limits. Appendix appears normal. No evidence of bowel wall thickening, distention, or inflammatory changes. Descending and sigmoid diverticulosis. Large burden of stool throughout the colon. Vascular/Lymphatic: Aortic atherosclerosis. 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 No enlarged abdominal or pelvic lymph nodes. Reproductive: Prostatomegaly with prostate brachytherapy. Other: Large right inguinal hernia containing numerous loops of terminal ileum as well as the appendix. No ascites. Musculoskeletal: No acute osseous findings. Widespread sclerotic osseous metastatic disease. IMPRESSION: 1. Unchanged somewhat ill-defined, matted appearing soft tissue interposed between the celiac axis, gastric fundus, and superior pancreatic body measuring approximately 3.9 x 2.0 cm. This is of  uncertain significance, most likely reflecting sequelae of prior pancreatitis given interval stability. 2. Mildly atrophic appearance of the pancreatic parenchyma with prominence of the pancreatic duct, this appearance unchanged and consistent with chronic stigmata of pancreatitis. No acute inflammatory findings. 3. 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 4. Widespread sclerotic osseous metastatic disease in keeping with known metastatic prostate malignancy. 5. Large right inguinal hernia containing numerous loops of nonobstructed terminal ileum as well as the appendix. 6. Prostatomegaly with prostate brachytherapy. Aortic Atherosclerosis (ICD10-I70.0). Electronically Signed   By: Delanna Ahmadi M.D.   On: 06/04/2022 18:39     ASSESSMENT:  Metastatic CSPC to the bones: - Initial diagnosed with prostate cancer  in 07/2008, low-stage low-grade.  He has been on finasteride. - Biopsy on 12/24/2013: Right lateral mid-prostatic adenocarcinoma, Gleason 3+3=6 - Biopsy on 06/25/2020: Prostatic adenocarcinoma, T2b, Gleason 4+3=7, PSA 12.8 (adjusted for finasteride) - Bone scan on 07/06/2020 and CT on 07/29/2020 negative. - 11/09/2020: Insertion of radioactive I-125 seeds - PSA 5.4 (6/22), 53.2 (11/17/2021), 66.7 (11/22/2021), 382 (01/12/2022) - CTAP on 11/25/2021 with no evidence of metastatic disease.  New ill-defined soft tissue adjacent to the pancreatic body encasing the proximal splenic artery, sequela of interval pancreatitis although neoplasm is considered.  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 - Bone scan on 12/08/2021 with possible uptake in the lower thoracic vertebral body.  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 - CT chest without contrast on 01/03/2022 with no CT correlate in the lower thoracic spine.  No additional metastatic disease. -  Degarelix  240 mg by Dr. Jeffie Pollock on 01/12/2022. - PSMA PET scan on 01/30/2022: Multiple skeletal metastasis in the axillary and appendicular skeleton with nearly every vertebral body involved with carcinoma.  Broad lesions in the sacrum and pelvis.  Focal activity in the anterior apical region of the prostate gland.  No metastatic adenopathy or visceral metastasis. Alford [place] 3 tablets daily started around 02/08/2022, dose increased to 4 tablets daily on 03/21/2022. -He was hospitalized from 03/12/2022 through 03/14/2022 with pneumonia.  I have reviewed hospitalization records.  He reportedly fell on the night of 03/14/2022 when he lost balance while hanging his robe. - Erleada dose reduced to 3 tablets daily on 04/10/2022 due to diarrhea and rash.   Social/family history: - Lives with wife and granddaughter at home.  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  Quit smoking 50 years ago. - 2 sisters had brain cancer.  Maternal grandmother had cancer.  Niece had breast cancer.   PLAN:  Metastatic CSPC to the bones: - Erleada dose was reduced to 3 tablets daily on 04/10/2022. - At that time rash on the left leg has improved. - He reports the rash has come back since last visit. - Examination shows hyperpigmented erythematous maculopapular rash on both upper thighs.  Denies any itching or pain. - Reviewed his labs which show normal LFTs and creatinine.  CBC shows white count is 3.8 with normal ANC.  Last PSA was 3.57 on 05/22/2022.  PSA from today is pending. - Would continue apalutamide at the same dose of 3 tablets daily.  If there is any worsening of the rash or other symptoms like itching and blistering, he will stop apalutamide and call us.  We will start darolutamide.  Otherwise we will see him back in 6 weeks for follow-up with repeat labs and PSA.  2.  Bone metastasis: - Calcium is 9.3 today.  Continue denosumab.  Continue calcium supplements.  3.  Atrial fibrillation and history of unprovoked right leg  DVT: - Continue warfarin.  No bleeding reported.  4.  Normocytic anemia: - Status post Feraheme x2 on 05/22/2022 and 05/29/2022.  Hemoglobin today improved to 9.4.  Ferritin is 79 and percent saturation is 18.  No further indication for IV iron.  5.  Hypomagnesemia: - Continue magnesium 3 times daily.   Orders placed this encounter:  No orders of the defined types were placed in this encounter.    Derek Jack, MD [place] [phone]   I, Thana Ates, am acting as a scribe for Dr. Derek Jack.  I, Derek Jack MD, have reviewed the above documentation for  accuracy and completeness, and I agree with the above.

## 2022-06-28 ENCOUNTER — Ambulatory Visit (INDEPENDENT_AMBULATORY_CARE_PROVIDER_SITE_OTHER): Payer: No Typology Code available for payment source | Admitting: Family Medicine

## 2022-06-28 ENCOUNTER — Encounter: Payer: Self-pay | Admitting: Family Medicine

## 2022-06-28 VITALS — BP 150/63 | HR 65 | Ht 65.0 in | Wt 145.0 lb

## 2022-06-28 DIAGNOSIS — Z86718 Personal history of other venous thrombosis and embolism: Secondary | ICD-10-CM

## 2022-06-28 DIAGNOSIS — Z7901 Long term (current) use of anticoagulants: Secondary | ICD-10-CM

## 2022-06-28 DIAGNOSIS — I480 Paroxysmal atrial fibrillation: Secondary | ICD-10-CM | POA: Diagnosis not present

## 2022-06-28 DIAGNOSIS — D6859 Other primary thrombophilia: Secondary | ICD-10-CM | POA: Diagnosis not present

## 2022-06-28 LAB — COAGUCHEK XS/INR WAIVED
INR: 1.4 — ABNORMAL HIGH (ref 0.9–1.1)
Prothrombin Time: 16.8 s

## 2022-06-28 MED ORDER — WARFARIN SODIUM 5 MG PO TABS: ORAL_TABLET | ORAL | 3 refills | Status: DC

## 2022-06-28 MED ORDER — GLUCOSE BLOOD VI STRP: ORAL_STRIP | 4 refills | Status: DC

## 2022-06-28 NOTE — Progress Notes (Signed)
BP (!) 150/63   Pulse 65   Ht 5' 5" (1.651 m)   Wt 145 lb (65.8 kg)   SpO2 98%   BMI 24.13 kg/m    Subjective:   Patient ID: Alan Netters., male    DOB: Apr 24, 1941, 81 y.o.   MRN: [id number]  HPI: Alan Arrighi. is a 81 y.o. male presenting on 06/28/2022 for Medical Management of Chronic Issues and Atrial Fibrillation   HPI Coumadin recheck Target goal: 2.0-3.0 Reason on anticoagulation: afib Patient denies any bruising or bleeding or chest pain or palpitations   Relevant past medical, surgical, family and social history reviewed and updated as indicated. Interim medical history since our last visit reviewed. Allergies and medications reviewed and updated.  Review of Systems  Constitutional:  Negative for chills and fever.  Eyes:  Negative for visual disturbance.  Respiratory:  Negative for shortness of breath and wheezing.   Cardiovascular:  Negative for chest pain and leg swelling.  Skin:  Negative for rash.  All other systems reviewed and are negative.   Per HPI unless specifically indicated above   Allergies as of 06/28/2022       Reactions   Allopurinol Other (See Comments)   Significantly decreased WBC's   Colchicine Other (See Comments)   Significantly decreased WBC's        Medication List        Accurate as of June 28, 2022  1:54 PM. If you have any questions, ask your nurse or doctor.          acetaminophen 500 MG tablet Commonly known as: TYLENOL Take 1,000 mg by mouth every 6 (six) hours as needed for moderate pain.   amLODipine 10 MG tablet Commonly known as: NORVASC Take 1 tablet (10 mg total) by mouth every evening.   CALCIUM 600 + D PO Take 1 tablet by mouth in the morning and at bedtime.   calcium carbonate 1500 (600 Ca) MG Tabs tablet Commonly known as: OSCAL Take by mouth 2 (two) times daily with a meal.   chlorthalidone 25 MG tablet Commonly known as: HYGROTON Take 1 tablet by mouth once daily   diclofenac Sodium 1 %  Gel Commonly known as: VOLTAREN Apply 1 application topically 2 (two) times daily as needed (pain).   Erleada 60 MG tablet Generic drug: apalutamide Take 4 tablets (240 mg total) by mouth daily. Take as directed. What changed: how much to take   fluticasone 50 MCG/ACT nasal spray Commonly known as: FLONASE Place 1 spray into both nostrils 2 (two) times daily as needed for allergies or rhinitis.   glucose blood test strip Commonly known as: ONE TOUCH ULTRA TEST Use to check BG once daily.  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 Can take every 4 hours if needed.   lisinopril 40 MG tablet Commonly known as: ZESTRIL Take 1 tablet (40 mg total) by mouth daily.   magnesium oxide 400 (240 Mg) MG tablet Commonly known as: MAG-OX Take 1 tablet (400 mg total) by mouth daily.   metFORMIN 1000 MG tablet Commonly known as: GLUCOPHAGE Take 1 tablet (1,000 mg total) by mouth 2 (two) times daily with a meal.   methylPREDNISolone 4 MG Tbpk tablet Commonly known as: MEDROL DOSEPAK Take as directed   metoprolol succinate 100 MG 24 hr tablet Commonly known as: TOPROL-XL Take  1 tablet (100 mg total) by mouth daily. Take with or immediately following a meal.   omeprazole 20 MG capsule Commonly known as: PRILOSEC Take 1 capsule (20 mg total) by mouth daily.   ONE TOUCH ULTRA MINI w/Device Kit 1 each by Does not apply route 2 (two) times daily.   potassium chloride SA 20 MEQ tablet Commonly known as: KLOR-CON M TAKE 1  BY MOUTH ONCE DAILY What changed: See the new instructions.   pravastatin 40 MG tablet Commonly known as: PRAVACHOL Take 1 tablet (40 mg total) by mouth every evening.   SV Iron 325 (65 FE) MG tablet Generic drug: ferrous sulfate Take 1 tablet by mouth once daily with breakfast   warfarin 5 MG tablet Commonly known as:  COUMADIN Take as directed by the anticoagulation clinic. If you are unsure how to take this medication, talk to your nurse or doctor. Original instructions: Take 2 tablets on Sundays and Wednesdays and TAKE 1 & 1/2 (ONE & ONE-HALF) TABLETS THE REST OF THE WEEK BY MOUTH ONCE DAILY 6 IN THE EVENING What changed: additional instructions Changed by: Fransisca Kaufmann Sareen Randon, MD         Objective:   BP (!) 150/63   Pulse 65   Ht 5' 5" (1.651 m)   Wt 145 lb (65.8 kg)   SpO2 98%   BMI 24.13 kg/m   Wt Readings from Last 3 Encounters:  06/28/22 145 lb (65.8 kg)  06/22/22 146 lb (66.2 kg)  05/29/22 143 lb 3.2 oz (65 kg)    Physical Exam Vitals and nursing note reviewed.  Constitutional:      Appearance: Normal appearance.  Musculoskeletal:        General: No swelling.  Neurological:     Mental Status: He is alert.       Assessment & Plan:   Problem List Items Addressed This Visit       Cardiovascular and Mediastinum   Paroxysmal atrial fibrillation (HCC) - Primary   Relevant Medications   warfarin (COUMADIN) 5 MG tablet   Other Relevant Orders   CoaguChek XS/INR Waived     Hematopoietic and Hemostatic   Primary hypercoagulable state ([place]) [D68.59]   Relevant Medications   warfarin (COUMADIN) 5 MG tablet     Other   Long term (current) use of anticoagulants [Z79.01]   History of DVT (deep vein thrombosis)    Recommend to keep check on blood pressures at home over the next few weeks till were seen again.  Description   Increase dose to take 2 tablets or 10 mg on Sundays and Wednesdays and then 1 1/2 tablets every other day,  INR was 1.4 (goal 2-3)   Follow-up 3-4weeks     Follow up plan: Return if symptoms worsen or fail to improve, for 3 to 4-week INR follow-up.  Counseling provided for all of the vaccine components Orders Placed This Encounter  Procedures   CoaguChek XS/INR Waived    Caryl Pina, MD [place] you get him  scheduled he said 06/28/2022, 1:54 PM

## 2022-06-29 ENCOUNTER — Encounter (HOSPITAL_COMMUNITY): Payer: Self-pay | Admitting: Hematology

## 2022-06-29 ENCOUNTER — Ambulatory Visit (INDEPENDENT_AMBULATORY_CARE_PROVIDER_SITE_OTHER): Payer: No Typology Code available for payment source | Admitting: Physician Assistant

## 2022-06-29 DIAGNOSIS — C61 Malignant neoplasm of prostate: Secondary | ICD-10-CM | POA: Diagnosis not present

## 2022-06-29 MED ORDER — DEGARELIX ACETATE 80 MG ~~LOC~~ SOLR
80.0000 mg | Freq: Once | SUBCUTANEOUS | Status: AC
  Administered 2022-06-29: 80 mg via SUBCUTANEOUS

## 2022-06-29 NOTE — Progress Notes (Signed)
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   Medication: Mills Koller (Degarelix)  Dose: '80mg'$  Location: left upper abdomen Lot: [id number] Exp: 07/11/2024  Patient tolerated well, no complications were noted  Performed by: Levi Aland, CMA  Follow up: Follow up in 1 month

## 2022-07-06 ENCOUNTER — Encounter (HOSPITAL_COMMUNITY): Payer: Self-pay

## 2022-07-06 ENCOUNTER — Inpatient Hospital Stay (HOSPITAL_COMMUNITY): Payer: No Typology Code available for payment source

## 2022-07-06 VITALS — BP 146/46 | HR 68 | Temp 98.4°F | Resp 17

## 2022-07-06 DIAGNOSIS — C61 Malignant neoplasm of prostate: Secondary | ICD-10-CM

## 2022-07-06 DIAGNOSIS — C7951 Secondary malignant neoplasm of bone: Secondary | ICD-10-CM

## 2022-07-06 LAB — COMPREHENSIVE METABOLIC PANEL
ALT: 11 U/L (ref 0–44)
AST: 14 U/L — ABNORMAL LOW (ref 15–41)
Albumin: 3.2 g/dL — ABNORMAL LOW (ref 3.5–5.0)
Alkaline Phosphatase: 43 U/L (ref 38–126)
Anion gap: 9 (ref 5–15)
BUN: 16 mg/dL (ref 8–23)
CO2: 28 mmol/L (ref 22–32)
Calcium: 8.9 mg/dL (ref 8.9–10.3)
Chloride: 101 mmol/L (ref 98–111)
Creatinine, Ser: 0.96 mg/dL (ref 0.61–1.24)
GFR, Estimated: >60 mL/min (ref >60)
Glucose, Bld: 120 mg/dL — ABNORMAL HIGH (ref 70–99)
Potassium: 3.7 mmol/L (ref 3.5–5.1)
Sodium: 138 mmol/L (ref 135–145)
Total Bilirubin: 0.4 mg/dL (ref 0.3–1.2)
Total Protein: 6.2 g/dL — ABNORMAL LOW (ref 6.5–8.1)

## 2022-07-06 MED ORDER — DENOSUMAB 120 MG/1.7ML ~~LOC~~ SOLN
120.0000 mg | Freq: Once | SUBCUTANEOUS | Status: AC
  Administered 2022-07-06: 120 mg via SUBCUTANEOUS

## 2022-07-06 NOTE — Patient Instructions (Signed)
[facility]  Discharge Instructions: Thank you for choosing [facility] to provide your oncology and hematology care.  If you have a lab appointment with the [facility], please come in thru the Main Entrance and check in at the main information desk.  Wear comfortable clothing and clothing appropriate for easy access to any Portacath or PICC line.   We strive to give you quality time with your provider. You may need to reschedule your appointment if you arrive late (15 or more minutes).  Arriving late affects you and other patients whose appointments are after yours.  Also, if you miss three or more appointments without notifying the office, you may be dismissed from the clinic at the provider's discretion.      For prescription refill requests, have your pharmacy contact our office and allow 72 hours for refills to be completed.    Today you received the following chemotherapy and/or immunotherapy agents Xgeva.  Denosumab injection What is this medication? DENOSUMAB (den oh sue mab) slows bone breakdown. Prolia is used to treat osteoporosis in women after menopause and in men, and in people who are taking corticosteroids for 6 months or more. Delton See is used to treat a high calcium level due to cancer and to prevent bone fractures and other bone problems caused by multiple myeloma or cancer bone metastases. Delton See is also used to treat giant cell tumor of the bone. This medicine may be used for other purposes; ask your health care provider or pharmacist if you have questions. COMMON BRAND NAME(S): Prolia, XGEVA What should I tell my care team before I take this medication? They need to know if you have any of these conditions: dental disease having surgery or tooth extraction infection kidney disease low levels of calcium or Vitamin D in the blood malnutrition on hemodialysis skin conditions or sensitivity thyroid or parathyroid disease an unusual reaction to  denosumab, other medicines, foods, dyes, or preservatives pregnant or trying to get pregnant breast-feeding How should I use this medication? This medicine is for injection under the skin. It is given by a health care professional in a hospital or clinic setting. A special MedGuide will be given to you before each treatment. Be sure to read this information carefully each time. For Prolia, talk to your pediatrician regarding the use of this medicine in children. Special care may be needed. For Delton See, talk to your pediatrician regarding the use of this medicine in children. While this drug may be prescribed for children as young as 13 years for selected conditions, precautions do apply. Overdosage: If you think you have taken too much of this medicine contact a poison control center or emergency room at once. NOTE: This medicine is only for you. Do not share this medicine with others. What if I miss a dose? It is important not to miss your dose. Call your doctor or health care professional if you are unable to keep an appointment. What may interact with this medication? Do not take this medicine with any of the following medications: other medicines containing denosumab This medicine may also interact with the following medications: medicines that lower your chance of fighting infection steroid medicines like prednisone or cortisone This list may not describe all possible interactions. Give your health care provider a list of all the medicines, herbs, non-prescription drugs, or dietary supplements you use. Also tell them if you smoke, drink alcohol, or use illegal drugs. Some items may interact with your medicine. What should  I watch for while using this medication? Visit your doctor or health care professional for regular checks on your progress. Your doctor or health care professional may order blood tests and other tests to see how you are doing. Call your doctor or health care professional for  advice if you get a fever, chills or sore throat, or other symptoms of a cold or flu. Do not treat yourself. This drug may decrease your body's ability to fight infection. Try to avoid being around people who are sick. You should make sure you get enough calcium and vitamin D while you are taking this medicine, unless your doctor tells you not to. Discuss the foods you eat and the vitamins you take with your health care professional. See your dentist regularly. Brush and floss your teeth as directed. Before you have any dental work done, tell your dentist you are receiving this medicine. Do not become pregnant while taking this medicine or for 5 months after stopping it. Talk with your doctor or health care professional about your birth control options while taking this medicine. Women should inform their doctor if they wish to become pregnant or think they might be pregnant. There is a potential for serious side effects to an unborn child. Talk to your health care professional or pharmacist for more information. What side effects may I notice from receiving this medication? Side effects that you should report to your doctor or health care professional as soon as possible: allergic reactions like skin rash, itching or hives, swelling of the face, lips, or tongue bone pain breathing problems dizziness jaw pain, especially after dental work redness, blistering, peeling of the skin signs and symptoms of infection like fever or chills; cough; sore throat; pain or trouble passing urine signs of low calcium like fast heartbeat, muscle cramps or muscle pain; pain, tingling, numbness in the hands or feet; seizures unusual bleeding or bruising unusually weak or tired Side effects that usually do not require medical attention (report to your doctor or health care professional if they continue or are bothersome): constipation diarrhea headache joint pain loss of appetite muscle pain runny  nose tiredness upset stomach This list may not describe all possible side effects. Call your doctor for medical advice about side effects. You may report side effects to FDA at 1-800-FDA-1088. Where should I keep my medication? This medicine is only given in a clinic, doctor's office, or other health care setting and will not be stored at home. NOTE: This sheet is a summary. It may not cover all possible information. If you have questions about this medicine, talk to your doctor, pharmacist, or health care provider.  2023 Elsevier/Gold Standard (2018-04-05 00:00:00)       To help prevent nausea and vomiting after your treatment, we encourage you to take your nausea medication as directed.  BELOW ARE SYMPTOMS THAT SHOULD BE REPORTED IMMEDIATELY: *FEVER GREATER THAN 100.4 F (38 C) OR HIGHER *CHILLS OR SWEATING *NAUSEA AND VOMITING THAT IS NOT CONTROLLED WITH YOUR NAUSEA MEDICATION *UNUSUAL SHORTNESS OF BREATH *UNUSUAL BRUISING OR BLEEDING *URINARY PROBLEMS (pain or burning when urinating, or frequent urination) *BOWEL PROBLEMS (unusual diarrhea, constipation, pain near the anus) TENDERNESS IN MOUTH AND THROAT WITH OR WITHOUT PRESENCE OF ULCERS (sore throat, sores in mouth, or a toothache) UNUSUAL RASH, SWELLING OR PAIN  UNUSUAL VAGINAL DISCHARGE OR ITCHING   Items with * indicate a potential emergency and should be followed up as soon as possible or go to the Emergency Department if any  problems should occur.  Please show the CHEMOTHERAPY ALERT CARD or IMMUNOTHERAPY ALERT CARD at check-in to the Emergency Department and triage nurse.  Should you have questions after your visit or need to cancel or reschedule your appointment, please contact [facility] [phone]  and follow the prompts.  Office hours are 8:00 a.m. to 4:30 p.m. Monday - Friday. Please note that voicemails left after 4:00 p.m. may not be returned until the following business day.  We are closed weekends and  major holidays. You have access to a nurse at all times for urgent questions. Please call the main number to the clinic [phone] and follow the prompts.  For any non-urgent questions, you may also contact your provider using MyChart. We now offer e-Visits for anyone 41 and older to request care online for non-urgent symptoms. For details visit [email or web address].   Also download the MyChart app! Go to the app store, search "MyChart", open the app, select [facility], and log in with your MyChart username and password.  Masks are optional in the cancer centers. If you would like for your care team to wear a mask while they are taking care of you, please let them know. For doctor visits, patients may have with them one support person who is at least 79 years old. At this time, visitors are not allowed in the infusion area.

## 2022-07-06 NOTE — Progress Notes (Signed)
Patient presents today for Xgeva injection. Calcium 8.9. Creatinine 0.96. Patient is taking Calcium carbonate([place]) 1500 mg po daily.   Alan [name]. presents today for injection per the provider's orders.  Xgeva  administration without incident; injection site WNL; see MAR for injection details.  Patient tolerated procedure well and without incident. No complaints at this time. Discharged from clinic by wheel chair in stable condition. Alert and oriented x 3. F/U with [facility] as scheduled.

## 2022-07-26 ENCOUNTER — Encounter: Payer: Self-pay | Admitting: Family Medicine

## 2022-07-26 ENCOUNTER — Ambulatory Visit (INDEPENDENT_AMBULATORY_CARE_PROVIDER_SITE_OTHER): Payer: No Typology Code available for payment source | Admitting: Family Medicine

## 2022-07-26 VITALS — BP 140/57 | HR 58 | Temp 98.0°F | Ht 65.0 in | Wt 146.0 lb

## 2022-07-26 DIAGNOSIS — I480 Paroxysmal atrial fibrillation: Secondary | ICD-10-CM

## 2022-07-26 DIAGNOSIS — I152 Hypertension secondary to endocrine disorders: Secondary | ICD-10-CM | POA: Diagnosis not present

## 2022-07-26 DIAGNOSIS — E1159 Type 2 diabetes mellitus with other circulatory complications: Secondary | ICD-10-CM

## 2022-07-26 DIAGNOSIS — D6859 Other primary thrombophilia: Secondary | ICD-10-CM | POA: Diagnosis not present

## 2022-07-26 DIAGNOSIS — E1169 Type 2 diabetes mellitus with other specified complication: Secondary | ICD-10-CM

## 2022-07-26 DIAGNOSIS — Z7901 Long term (current) use of anticoagulants: Secondary | ICD-10-CM | POA: Diagnosis not present

## 2022-07-26 DIAGNOSIS — Z86718 Personal history of other venous thrombosis and embolism: Secondary | ICD-10-CM | POA: Diagnosis not present

## 2022-07-26 LAB — COAGUCHEK XS/INR WAIVED
INR: 1.6 — ABNORMAL HIGH (ref 0.9–1.1)
Prothrombin Time: 19.6 s

## 2022-07-26 MED ORDER — LISINOPRIL 40 MG PO TABS: 40.0000 mg | ORAL_TABLET | Freq: Every day | ORAL | 1 refills | Status: DC

## 2022-07-26 MED ORDER — POTASSIUM CHLORIDE CRYS ER 20 MEQ PO TBCR: EXTENDED_RELEASE_TABLET | ORAL | 1 refills | Status: DC

## 2022-07-26 MED ORDER — FERROUS SULFATE 325 (65 FE) MG PO TABS: 325.0000 mg | ORAL_TABLET | Freq: Every day | ORAL | 1 refills | Status: DC

## 2022-07-26 NOTE — Progress Notes (Signed)
BP (!) 140/57   Pulse (!) 58   Temp 98 F (36.7 C)   Ht '5\' 5"'  (1.651 m)   Wt 146 lb (66.2 kg)   SpO2 99%   BMI 24.30 kg/m    Subjective:   Patient ID: Alan [name]., male    DOB: Aug 06, 1941, 81 y.o.   MRN: [id number]  HPI: Alan [name]. is a 81 y.o. male presenting on 07/26/2022 for Medical Management of Chronic Issues and Atrial Fibrillation   HPI Coumadin recheck Target goal: 2.0-3.0 Reason on anticoagulation: History of DVTs with current paroxysmal A-fib Patient denies any bruising or bleeding or chest pain or palpitations   Relevant past medical, surgical, family and social history reviewed and updated as indicated. Interim medical history since our last visit reviewed. Allergies and medications reviewed and updated.  Review of Systems  Constitutional:  Negative for chills and fever.  Respiratory:  Negative for shortness of breath and wheezing.   Cardiovascular:  Negative for chest pain and leg swelling.  Gastrointestinal:  Negative for blood in stool.  Genitourinary:  Negative for hematuria.  Musculoskeletal:  Negative for back pain and gait problem.  Skin:  Negative for rash.  All other systems reviewed and are negative.   Per HPI unless specifically indicated above   Allergies as of 07/26/2022       Reactions   Allopurinol Other (See Comments)   Significantly decreased WBC's   Colchicine Other (See Comments)   Significantly decreased WBC's        Medication List        Accurate as of July 26, 2022 12:04 PM. If you have any questions, ask your nurse or doctor.          acetaminophen 500 MG tablet Commonly known as: TYLENOL Take 1,000 mg by mouth every 6 (six) hours as needed for moderate pain.   amLODipine 10 MG tablet Commonly known as: NORVASC Take 1 tablet (10 mg total) by mouth every evening.   CALCIUM 600 + D PO Take 1 tablet by mouth in the morning and at bedtime.   calcium carbonate 1500 (600 Ca) MG Tabs tablet Commonly known  as: OSCAL Take by mouth 2 (two) times daily with a meal.   chlorthalidone 25 MG tablet Commonly known as: HYGROTON Take 1 tablet by mouth once daily   diclofenac Sodium 1 % Gel Commonly known as: VOLTAREN Apply 1 application topically 2 (two) times daily as needed (pain).   Erleada 60 MG tablet Generic drug: apalutamide Take 4 tablets (240 mg total) by mouth daily. Take as directed. What changed: how much to take   ferrous sulfate 325 (65 FE) MG tablet Commonly known as: SV Iron Take 1 tablet (325 mg total) by mouth daily with breakfast. What changed: how much to take Changed by: Fransisca Kaufmann Ghadeer Kastelic, MD   fluticasone 50 MCG/ACT nasal spray Commonly known as: FLONASE Place 1 spray into both nostrils 2 (two) times daily as needed for allergies or rhinitis.   glucose blood test strip Commonly known as: ONE TOUCH ULTRA TEST Use to check BG once daily.  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 Can take every 4 hours if needed.   lisinopril 40 MG tablet Commonly known as: ZESTRIL Take 1 tablet (40 mg total) by mouth daily.   magnesium oxide 400 (240 Mg) MG tablet  Commonly known as: MAG-OX Take 1 tablet (400 mg total) by mouth daily.   metFORMIN 1000 MG tablet Commonly known as: GLUCOPHAGE Take 1 tablet (1,000 mg total) by mouth 2 (two) times daily with a meal.   methylPREDNISolone 4 MG Tbpk tablet Commonly known as: MEDROL DOSEPAK Take as directed   metoprolol succinate 100 MG 24 hr tablet Commonly known as: TOPROL-XL Take 1 tablet (100 mg total) by mouth daily. Take with or immediately following a meal.   omeprazole 20 MG capsule Commonly known as: PRILOSEC Take 1 capsule (20 mg total) by mouth daily.   ONE TOUCH ULTRA MINI w/Device Kit 1 each by Does not apply route 2 (two) times daily.   potassium chloride SA 20 MEQ  tablet Commonly known as: KLOR-CON M TAKE 1  BY MOUTH ONCE DAILY Strength: 20 mEq What changed: See the new instructions. Changed by: Fransisca Kaufmann Xavien Dauphinais, MD   pravastatin 40 MG tablet Commonly known as: PRAVACHOL Take 1 tablet (40 mg total) by mouth every evening.   warfarin 5 MG tablet Commonly known as: COUMADIN Take as directed by the anticoagulation clinic. If you are unsure how to take this medication, talk to your nurse or doctor. Original instructions: Take 2 tablets on Sundays and Wednesdays and TAKE 1 & 1/2 (ONE & ONE-HALF) TABLETS THE REST OF THE WEEK BY MOUTH ONCE DAILY 6 IN THE EVENING         Objective:   BP (!) 140/57   Pulse (!) 58   Temp 98 F (36.7 C)   Ht '5\' 5"'  (1.651 m)   Wt 146 lb (66.2 kg)   SpO2 99%   BMI 24.30 kg/m   Wt Readings from Last 3 Encounters:  07/26/22 146 lb (66.2 kg)  06/28/22 145 lb (65.8 kg)  06/22/22 146 lb (66.2 kg)    Physical Exam Vitals and nursing note reviewed.  Constitutional:      General: He is not in acute distress.    Appearance: He is well-developed. He is not diaphoretic.  Eyes:     General: No scleral icterus.    Conjunctiva/sclera: Conjunctivae normal.  Neck:     Thyroid: No thyromegaly.  Cardiovascular:     Rate and Rhythm: Normal rate. Rhythm irregular.     Heart sounds: Normal heart sounds. No murmur heard. Pulmonary:     Effort: Pulmonary effort is normal. No respiratory distress.     Breath sounds: Normal breath sounds. No wheezing.  Musculoskeletal:        General: Normal range of motion.     Cervical back: Neck supple.  Lymphadenopathy:     Cervical: No cervical adenopathy.  Skin:    General: Skin is warm and dry.     Findings: No rash.  Neurological:     Mental Status: He is alert and oriented to person, place, and time.     Coordination: Coordination normal.  Psychiatric:        Behavior: Behavior normal.       Assessment & Plan:   Problem List Items Addressed This Visit        Cardiovascular and Mediastinum   Hypertension associated with diabetes ([place])   Relevant Medications   lisinopril (ZESTRIL) 40 MG tablet   Paroxysmal atrial fibrillation (HCC) - Primary   Relevant Medications   lisinopril (ZESTRIL) 40 MG tablet   Other Relevant Orders   CoaguChek XS/INR Waived     Hematopoietic and Hemostatic   Primary hypercoagulable state ([place]) [D68.59]  Other   Long term (current) use of anticoagulants [Z79.01]   History of DVT (deep vein thrombosis)   Other Visit Diagnoses     Type 2 diabetes mellitus with other specified complication, without long-term current use of insulin (HCC)       Relevant Medications   lisinopril (ZESTRIL) 40 MG tablet   potassium chloride SA (KLOR-CON M) 20 MEQ tablet       Description   Increase dose to take 2 tablets or 10 mg on Sundays, Tuesday and Wednesdays and Fridays and then 1 1/2 tablets every other day,  INR was 1.6 (goal 2-3)   Follow-up 3-4weeks     Follow up plan: Return if symptoms worsen or fail to improve, for 3 to 4-week follow-up for INR.  Counseling provided for all of the vaccine components Orders Placed This Encounter  Procedures   CoaguChek XS/INR Ideal Kesler Wickham, MD [facility] 07/26/2022, 12:04 PM

## 2022-07-26 NOTE — Addendum Note (Signed)
Addended by: Caryl Pina on: 07/26/2022 12:10 PM   Modules accepted: Orders

## 2022-07-27 ENCOUNTER — Ambulatory Visit (INDEPENDENT_AMBULATORY_CARE_PROVIDER_SITE_OTHER): Payer: No Typology Code available for payment source | Admitting: Physician Assistant

## 2022-07-27 DIAGNOSIS — C61 Malignant neoplasm of prostate: Secondary | ICD-10-CM | POA: Diagnosis not present

## 2022-07-27 MED ORDER — DEGARELIX ACETATE 80 MG ~~LOC~~ SOLR
80.0000 mg | Freq: Once | SUBCUTANEOUS | Status: AC
  Administered 2022-07-27: 80 mg via SUBCUTANEOUS

## 2022-07-27 NOTE — Progress Notes (Signed)
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   Medication: Firmagon (Degarelix)  Dose: '80mg'$  Location: right upper abdomen  Patient tolerated well, no complications were noted  Performed by: Lonnette Shrode LPN   Follow up: keep scheduled OV            Reviewed and agree

## 2022-08-03 ENCOUNTER — Inpatient Hospital Stay: Payer: No Typology Code available for payment source | Attending: Hematology | Admitting: Hematology

## 2022-08-03 ENCOUNTER — Inpatient Hospital Stay: Payer: No Typology Code available for payment source

## 2022-08-03 VITALS — BP 128/63 | HR 82 | Temp 98.0°F | Resp 18 | Wt 144.6 lb

## 2022-08-03 DIAGNOSIS — C7951 Secondary malignant neoplasm of bone: Secondary | ICD-10-CM

## 2022-08-03 DIAGNOSIS — Z8249 Family history of ischemic heart disease and other diseases of the circulatory system: Secondary | ICD-10-CM | POA: Insufficient documentation

## 2022-08-03 DIAGNOSIS — Z823 Family history of stroke: Secondary | ICD-10-CM | POA: Diagnosis not present

## 2022-08-03 DIAGNOSIS — C61 Malignant neoplasm of prostate: Secondary | ICD-10-CM | POA: Diagnosis not present

## 2022-08-03 DIAGNOSIS — E785 Hyperlipidemia, unspecified: Secondary | ICD-10-CM | POA: Diagnosis not present

## 2022-08-03 DIAGNOSIS — Z79899 Other long term (current) drug therapy: Secondary | ICD-10-CM | POA: Diagnosis not present

## 2022-08-03 DIAGNOSIS — Z808 Family history of malignant neoplasm of other organs or systems: Secondary | ICD-10-CM | POA: Diagnosis not present

## 2022-08-03 DIAGNOSIS — Z803 Family history of malignant neoplasm of breast: Secondary | ICD-10-CM | POA: Diagnosis not present

## 2022-08-03 DIAGNOSIS — R059 Cough, unspecified: Secondary | ICD-10-CM | POA: Diagnosis not present

## 2022-08-03 DIAGNOSIS — I10 Essential (primary) hypertension: Secondary | ICD-10-CM | POA: Diagnosis not present

## 2022-08-03 DIAGNOSIS — I480 Paroxysmal atrial fibrillation: Secondary | ICD-10-CM | POA: Insufficient documentation

## 2022-08-03 DIAGNOSIS — R200 Anesthesia of skin: Secondary | ICD-10-CM | POA: Diagnosis not present

## 2022-08-03 DIAGNOSIS — D649 Anemia, unspecified: Secondary | ICD-10-CM | POA: Diagnosis not present

## 2022-08-03 DIAGNOSIS — R0602 Shortness of breath: Secondary | ICD-10-CM | POA: Diagnosis not present

## 2022-08-03 DIAGNOSIS — K219 Gastro-esophageal reflux disease without esophagitis: Secondary | ICD-10-CM | POA: Diagnosis not present

## 2022-08-03 DIAGNOSIS — Z818 Family history of other mental and behavioral disorders: Secondary | ICD-10-CM | POA: Insufficient documentation

## 2022-08-03 DIAGNOSIS — Z87891 Personal history of nicotine dependence: Secondary | ICD-10-CM | POA: Diagnosis not present

## 2022-08-03 LAB — IRON AND TIBC
Iron: 34 ug/dL — ABNORMAL LOW (ref 45–182)
Saturation Ratios: 10 % — ABNORMAL LOW (ref 17.9–39.5)
TIBC: 330 ug/dL (ref 250–450)
UIBC: 296 ug/dL

## 2022-08-03 LAB — CBC WITH DIFFERENTIAL/PLATELET
Abs Immature Granulocytes: 0.01 10*3/uL (ref 0.00–0.07)
Basophils Absolute: 0 10*3/uL (ref 0.0–0.1)
Basophils Relative: 0 %
Eosinophils Absolute: 0.1 10*3/uL (ref 0.0–0.5)
Eosinophils Relative: 2 %
HCT: 28.9 % — ABNORMAL LOW (ref 39.0–52.0)
Hemoglobin: 9.4 g/dL — ABNORMAL LOW (ref 13.0–17.0)
Immature Granulocytes: 0 %
Lymphocytes Relative: 18 %
Lymphs Abs: 0.9 10*3/uL (ref 0.7–4.0)
MCH: 30.6 pg (ref 26.0–34.0)
MCHC: 32.5 g/dL (ref 30.0–36.0)
MCV: 94.1 fL (ref 80.0–100.0)
Monocytes Absolute: 0.5 10*3/uL (ref 0.1–1.0)
Monocytes Relative: 11 %
Neutro Abs: 3.3 10*3/uL (ref 1.7–7.7)
Neutrophils Relative %: 69 %
Platelets: 204 10*3/uL (ref 150–400)
RBC: 3.07 MIL/uL — ABNORMAL LOW (ref 4.22–5.81)
RDW: 15.9 % — ABNORMAL HIGH (ref 11.5–15.5)
WBC: 4.8 10*3/uL (ref 4.0–10.5)
nRBC: 0 % (ref 0.0–0.2)

## 2022-08-03 LAB — COMPREHENSIVE METABOLIC PANEL
ALT: 10 U/L (ref 0–44)
AST: 17 U/L (ref 15–41)
Albumin: 3.3 g/dL — ABNORMAL LOW (ref 3.5–5.0)
Alkaline Phosphatase: 44 U/L (ref 38–126)
Anion gap: 11 (ref 5–15)
BUN: 19 mg/dL (ref 8–23)
CO2: 27 mmol/L (ref 22–32)
Calcium: 9.2 mg/dL (ref 8.9–10.3)
Chloride: 99 mmol/L (ref 98–111)
Creatinine, Ser: 1.02 mg/dL (ref 0.61–1.24)
GFR, Estimated: >60 mL/min (ref >60)
Glucose, Bld: 206 mg/dL — ABNORMAL HIGH (ref 70–99)
Potassium: 3.7 mmol/L (ref 3.5–5.1)
Sodium: 137 mmol/L (ref 135–145)
Total Bilirubin: 0.9 mg/dL (ref 0.3–1.2)
Total Protein: 6.6 g/dL (ref 6.5–8.1)

## 2022-08-03 LAB — PSA: Prostatic Specific Antigen: 14.94 ng/mL — ABNORMAL HIGH (ref 0.00–4.00)

## 2022-08-03 LAB — FERRITIN: Ferritin: 19 ng/mL — ABNORMAL LOW (ref 24–336)

## 2022-08-03 MED ORDER — DENOSUMAB 120 MG/1.7ML ~~LOC~~ SOLN
120.0000 mg | Freq: Once | SUBCUTANEOUS | Status: AC
  Administered 2022-08-03: 120 mg via SUBCUTANEOUS
  Filled 2022-08-03: qty 1.7

## 2022-08-03 NOTE — Progress Notes (Signed)
Reviewed and agree.

## 2022-08-03 NOTE — Patient Instructions (Signed)
[facility]  Discharge Instructions  You were seen and examined today by Dr. Delton Coombes.  Dr. Delton Coombes discussed your most recent lab work and everything looks good except the PSA is not back yet. We will recheck labs at next visit.  Continue medication as prescribed.  Follow-up as scheduled in 8 weeks.    Thank you for choosing [facility] to provide your oncology and hematology care.   To afford each patient quality time with our provider, please arrive at least 15 minutes before your scheduled appointment time. You may need to reschedule your appointment if you arrive late (10 or more minutes). Arriving late affects you and other patients whose appointments are after yours.  Also, if you miss three or more appointments without notifying the office, you may be dismissed from the clinic at the provider's discretion.    Again, thank you for choosing [facility].  Our hope is that these requests will decrease the amount of time that you wait before being seen by our physicians.   If you have a lab appointment with the [facility] please come in thru the Main Entrance and check in at the main information desk.           _____________________________________________________________  Should you have questions after your visit to [facility], please contact our office at [phone] and follow the prompts.  Our office hours are 8:00 a.m. to 4:30 p.m. Monday - Thursday and 8:00 a.m. to 2:30 p.m. Friday.  Please note that voicemails left after 4:00 p.m. may not be returned until the following business day.  We are closed weekends and all major holidays.  You do have access to a nurse 24-7, just call the main number to the clinic [phone] and do not press any options, hold on the line and a nurse will answer the phone.    For prescription refill requests, have your pharmacy contact our office and allow 72  hours.

## 2022-08-03 NOTE — Progress Notes (Signed)
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8, Grade Group: 3) - Signed by Alan Caldron, PA-C on 09/29/2020 - Pathologic stage from 01/26/2022: Stage IVB (pT2, pN0, pM1b, Grade Group: 3) - Unsigned   INTERVAL HISTORY:  [name], a 81 y.o. 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  He reports that he is taking Erleada 3 tablets daily without any major problems.  Appetite and energy levels are 75%.  No new onset pains reported.  REVIEW OF SYSTEMS:  Review of Systems  Constitutional:  Negative for appetite change and fatigue.  HENT:   Negative for nosebleeds.   Respiratory:  Positive for cough and shortness of breath. Negative for hemoptysis.   Gastrointestinal:  Negative for blood in stool and diarrhea.  Genitourinary:  Negative for hematuria.   Skin:  Negative for rash.  Neurological:  Positive for numbness (L leg).  Hematological:  Does not bruise/bleed easily.  Psychiatric/Behavioral:  Negative for sleep disturbance.   All other systems reviewed and are negative.   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   found during hospital stay with pancreatitis,  started coumadin;   last Abd CT in epic 07-29-2020  chronic thrombus portal venous   GERD (gastroesophageal reflux disease)    Heart murmur    History of acute pancreatitis    2016   necrotizing pancreatitis   History of adenomatous polyp of colon    History of DVT of lower extremity    History of GI bleed 08/2019   upper gi bleed due to duodenal ulcer   History of gout yrs ago   Hypertension    followed by pcp   Hypertrophic cardiomyopathy ([place])    followed by cardiology   Internal hemorrhoids    Mixed hyperlipidemia    Normocytic anemia    PAF (paroxysmal atrial fibrillation) ([place]) 04/2016   cardiologist-- dr h. Tamala Julian--  event monitor 07-04-2017 epic, NSR/ PAflutter with occasional RVR/  PACs/ PVCs ;  echo 05-16-2017 epic,  moderate LVH with severe asymptomic septal hypertrophy, G1DD, EF 60-65%,  mild AR, mild LAE   Pancreatic pseudocyst    Peyronie's disease    Prostate cancer [facility]) urologist-- dr wrenn/ oncologist-- dr Tammi Klippel   first dx 08/ 2009 Gleason 3+3 active survelliance;  until bx 07/ 2021  Stage T2b, Gleason 4+3   Right  inguinal hernia    Type 2 diabetes mellitus ([place])    followed by pcp---  (11-03-2020 per pt currently not checking blood sugar due to glucose monitor broken)   Wears glasses    Wears partial dentures    upper and lower   Past Surgical History:  Procedure Laterality Date   BIOPSY  02/09/2022   Procedure: BIOPSY;  Surgeon: Milus Banister, MD;  Location: [facility];  Service: Endoscopy;;   CHOLECYSTECTOMY N/A 11/26/2015   Procedure: LAPAROSCOPIC CHOLECYSTECTOMY;  Surgeon: Aviva Signs, MD;  Location: [facility];  Service: General;  Laterality: N/A;   COLONOSCOPY  last one 09-06-2020  dr  stark   CYSTOSCOPY N/A 11/09/2020   Procedure: Erlene Quan;  Surgeon: Irine Seal, MD;  Location: [facility];  Service: Urology;  Laterality: N/A;   ESOPHAGOGASTRODUODENOSCOPY  10-06-2019  _0    ESOPHAGOGASTRODUODENOSCOPY N/A 02/09/2022   Procedure: ESOPHAGOGASTRODUODENOSCOPY (EGD);  Surgeon: Milus Banister, MD;  Location: [facility];  Service: Endoscopy;  Laterality: N/A;   EUS N/A 02/09/2022   Procedure: UPPER ENDOSCOPIC ULTRASOUND (EUS) RADIAL;  Surgeon: Milus Banister, MD;  Location: [facility];  Service: Endoscopy;  Laterality: N/A;   FLEXIBLE BRONCHOSCOPY Bilateral 04/14/2016   Procedure: FLEXIBLE BRONCHOSCOPY;  Surgeon: Sinda Du, MD;  [facility];  Service: Cardiopulmonary;  Laterality: Bilateral;   HEMORRHOID SURGERY  yrs ago   RADIOACTIVE SEED IMPLANT N/A 11/09/2020   Procedure: RADIOACTIVE SEED IMPLANT/BRACHYTHERAPY IMPLANT;  Surgeon: Irine Seal, MD;  Location: [facility];  Service: Urology;  Laterality: N/A;   SPACE OAR INSTILLATION N/A 11/09/2020   Procedure: SPACE OAR INSTILLATION;  Surgeon: Irine Seal, MD;  Location: [facility];  Service: Urology;  Laterality: N/A;   UMBILICAL HERNIA REPAIR N/A 11/26/2015   Procedure: UMBILICAL HERNIORRHAPHY;  Surgeon: Aviva Signs, MD;  Location: [facility];  Service: General;  Laterality: N/A;    SOCIAL HISTORY:  Social History   Socioeconomic History   Marital status: Married    Spouse name: [name]   Number of children: 4   Years of education: Not on file   Highest education level: 10th grade  Occupational History   Occupation: Retired     Fish farm manager: [employer]  Tobacco Use   Smoking status: Former    Years: 5.00    Types: Cigarettes    Quit date: 12/11/1986    Years since quitting: 35.6   Smokeless tobacco: Never  Vaping Use   Vaping Use: Never used  Substance and Sexual Activity   Alcohol use: No   Drug use: Never   Sexual activity: Yes  Other Topics  Concern   Not on file  Social History Narrative   Not on file   Social Determinants of Health   Financial Resource Strain: Low Risk  (08/12/2021)   Overall Financial Resource Strain (CARDIA)    Difficulty of Paying Living Expenses: Not hard at all  Food Insecurity: No Food Insecurity (08/12/2021)   Hunger Vital Sign    Worried About Running Out of Food in the Last Year: Never true    [employer] in the Last Year: Never true  Transportation Needs: No Transportation Needs (08/12/2021)   PRAPARE - Hydrologist (Medical): No    Lack of Transportation (Non-Medical): No  Physical Activity: Inactive (10/25/2021)   Exercise Vital Sign    Days of Exercise per Week: 0 days    Minutes of Exercise per Session: 0 min  Stress:  Stress Concern Present (10/25/2021)   [place]    Feeling of Stress : To some extent  Social Connections: Socially Integrated (08/12/2021)   Social Connection and Isolation Panel [NHANES]    Frequency of Communication with Friends and Family: Three times a week    Frequency of Social Gatherings with Friends and Family: Three times a week    Attends Religious Services: More than 4 times per year    Active Member of Clubs or Organizations: Yes    Attends Archivist Meetings: More than 4 times per year    Marital Status: Married  Human resources officer Violence: Not At Risk (08/12/2021)   Humiliation, Afraid, Rape, and Kick questionnaire    Fear of Current or Ex-Partner: No    Emotionally Abused: No    Physically Abused: No    Sexually Abused: No    FAMILY HISTORY:  Family History  Problem Relation Age of Onset   Stroke Mother    Hypertension Mother    Alzheimer's disease Mother    Hypertension Father    Brain cancer Sister    Breast cancer Sister    Hypertension Sister    Deep vein thrombosis Brother    Hypertension Brother    Colon cancer Neg Hx    Pancreatic  disease Neg Hx    Esophageal cancer Neg Hx    Rectal cancer Neg Hx    Stomach cancer Neg Hx     CURRENT MEDICATIONS:  Current Outpatient Medications  Medication Sig Dispense Refill   acetaminophen (TYLENOL) 500 MG tablet Take 1,000 mg by mouth every 6 (six) hours as needed for moderate pain.     amLODipine (NORVASC) 10 MG tablet Take 1 tablet (10 mg total) by mouth every evening. 90 tablet 3   apalutamide (ERLEADA) 60 MG tablet Take 4 tablets (240 mg total) by mouth daily. Take as directed. (Patient taking differently: Take 180 mg by mouth daily. Take as directed.) 120 tablet 0   Blood Glucose Monitoring Suppl (ONE TOUCH ULTRA MINI) w/Device KIT 1 each by Does not apply route 2 (two) times daily. 1 kit 1   Calcium Carb-Cholecalciferol (CALCIUM 600 + D PO) Take 1 tablet by mouth in the morning and at bedtime.     calcium carbonate (OSCAL) 1500 (600 Ca) MG TABS tablet Take by mouth 2 (two) times daily with a meal.     chlorthalidone (HYGROTON) 25 MG tablet Take 1 tablet by mouth once daily 90 tablet 3   diclofenac Sodium (VOLTAREN) 1 % GEL Apply 1 application topically 2 (two) times daily as needed (pain).     ferrous sulfate (SV IRON) 325 (65 FE) MG tablet Take 1 tablet (325 mg total) by mouth daily with breakfast. 90 tablet 1   fluticasone (FLONASE) 50 MCG/ACT nasal spray Place 1 spray into both nostrils 2 (two) times daily as needed for allergies or rhinitis. 16 g 6   glucose blood (ONE TOUCH ULTRA TEST) test strip Use to check BG once daily.  Dx:  Type 2 DM controlled E11.9 100 each 4   HYDROcodone-acetaminophen (NORCO/VICODIN) 5-325 MG tablet Take 1 tablet by mouth every 6 (six) hours as needed for moderate pain. Can take every 4 hours if needed. 30 tablet 0   lisinopril (ZESTRIL) 40 MG tablet Take 1 tablet (40 mg total) by mouth daily. 90 tablet 1   magnesium oxide (MAG-OX) 400 (240 Mg) MG tablet Take 1 tablet (400 mg total) by mouth  daily. 90 tablet 6   metFORMIN (GLUCOPHAGE) 1000 MG  tablet Take 1 tablet (1,000 mg total) by mouth 2 (two) times daily with a meal. 180 tablet 3   methylPREDNISolone (MEDROL DOSEPAK) 4 MG TBPK tablet Take as directed 21 tablet 0   metoprolol succinate (TOPROL-XL) 100 MG 24 hr tablet Take 1 tablet (100 mg total) by mouth daily. Take with or immediately following a meal. 90 tablet 3   omeprazole (PRILOSEC) 20 MG capsule Take 1 capsule (20 mg total) by mouth daily. 90 capsule 3   potassium chloride SA (KLOR-CON M) 20 MEQ tablet TAKE 1  BY MOUTH ONCE DAILY Strength: 20 mEq 90 tablet 1   pravastatin (PRAVACHOL) 40 MG tablet Take 1 tablet (40 mg total) by mouth every evening. 90 tablet 3   warfarin (COUMADIN) 5 MG tablet Take 2 tablets on Sundays and Wednesdays and TAKE 1 & 1/2 (ONE & ONE-HALF) TABLETS THE REST OF THE WEEK BY MOUTH ONCE DAILY 6 IN THE EVENING 180 tablet 3   No current facility-administered medications for this visit.    ALLERGIES:  Allergies  Allergen Reactions   Allopurinol Other (See Comments)    Significantly decreased WBC's   Colchicine Other (See Comments)    Significantly decreased WBC's    PHYSICAL EXAM:  Performance status (ECOG): 1 - Symptomatic but completely ambulatory  Vitals:   08/03/22 1417  BP: 128/63  Pulse: 82  Resp: 18  Temp: 98 F (36.7 C)  SpO2: 98%   Wt Readings from Last 3 Encounters:  08/03/22 144 lb 9.6 oz (65.6 kg)  07/26/22 146 lb (66.2 kg)  06/28/22 145 lb (65.8 kg)   Physical Exam Vitals reviewed.  Constitutional:      Appearance: Normal appearance.     Comments: In wheelchair  Cardiovascular:     Rate and Rhythm: Normal rate and regular rhythm.     Pulses: Normal pulses.     Heart sounds: Normal heart sounds.  Pulmonary:     Effort: Pulmonary effort is normal.     Breath sounds: Normal breath sounds.  Skin:    Findings: Erythema (lesions on thighs bilaterally), lesion (Hyperpigmented prodominently on thighs) and rash (Hyperpigmented lesions prodominently on thighs) present.  Rash is macular and papular. Rash is not scaling.  Neurological:     General: No focal deficit present.     Mental Status: He is alert and oriented to person, place, and time.  Psychiatric:        Mood and Affect: Mood normal.        Behavior: Behavior normal.      LABORATORY DATA:  I have reviewed the labs as listed.     Latest Ref Rng & Units 08/03/2022    1:09 PM 06/22/2022    2:18 PM 05/29/2022   11:19 AM  CBC  WBC 4.0 - 10.5 K/uL 4.8  3.8  4.0   Hemoglobin 13.0 - 17.0 g/dL 9.4  9.4  8.5   Hematocrit 39.0 - 52.0 % 28.9  29.4  26.7   Platelets 150 - 400 K/uL 204  192  195       Latest Ref Rng & Units 08/03/2022    1:09 PM 07/06/2022   10:07 AM 06/22/2022    2:18 PM  CMP  Glucose 70 - 99 mg/dL 206  120  172   BUN 8 - 23 mg/dL _0 Creatinine 0.61 - 1.24 mg/dL 1.02  0.96  1.00   Sodium  135 - 145 mmol/L 137  138  135   Potassium 3.5 - 5.1 mmol/L 3.7  3.7  3.9   Chloride 98 - 111 mmol/L 99  101  100   CO2 22 - 32 mmol/L _0 Calcium 8.9 - 10.3 mg/dL 9.2  8.9  9.3   Total Protein 6.5 - 8.1 g/dL 6.6  6.2  6.2   Total Bilirubin 0.3 - 1.2 mg/dL 0.9  0.4  0.3   Alkaline Phos 38 - 126 U/L 44  43  38   AST 15 - 41 U/L _1 ALT 0 - 44 U/L _2 DIAGNOSTIC IMAGING:  I have independently reviewed the scans and discussed with the patient. No results found.   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  He has been on finasteride. - Biopsy on 12/24/2013: Right lateral mid-prostatic adenocarcinoma, Gleason 3+3=6 - Biopsy on 06/25/2020: Prostatic adenocarcinoma, T2b, Gleason 4+3=7, PSA 12.8 (adjusted for finasteride) - Bone scan on 07/06/2020 and CT on 07/29/2020 negative. - 11/09/2020: Insertion of radioactive I-125 seeds - PSA 5.4 (6/22), 53.2 (11/17/2021), 66.7 (11/22/2021), 382 (01/12/2022) - CTAP on 11/25/2021 with no evidence of metastatic disease.  New ill-defined soft tissue  adjacent to the pancreatic body encasing the proximal splenic artery, sequela of interval pancreatitis although neoplasm is considered.  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 - Bone scan on 12/08/2021 with possible uptake in the lower thoracic vertebral body.  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 - CT chest without contrast on 01/03/2022 with no CT correlate in the lower thoracic spine.  No additional metastatic disease. - Degarelix  240 mg by Dr. Jeffie Pollock on 01/12/2022. - PSMA PET scan on 01/30/2022: Multiple skeletal metastasis in the axillary and appendicular skeleton with nearly every vertebral body involved with carcinoma.  Broad lesions in the sacrum and pelvis.  Focal activity in the anterior apical region of the prostate gland.  No metastatic adenopathy or visceral metastasis. [place] 3 tablets daily started around 02/08/2022, dose increased to 4 tablets daily on 03/21/2022. -He was hospitalized from 03/12/2022 through 03/14/2022 with pneumonia.  I have reviewed hospitalization records.  He reportedly fell on the night of 03/14/2022 when he lost balance while hanging his robe. - Erleada dose reduced to 3 tablets daily on 04/10/2022 due to diarrhea and rash.   Social/family history: - Lives with wife and granddaughter at home.  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  Quit smoking 50 years ago. - 2 sisters had brain cancer.  Maternal grandmother had cancer.  Niece had breast cancer.   PLAN:  Metastatic CSPC to the bones: - Erleada dose reduced to 3 tablets daily on 04/10/2022. - He reports hyperpigmented maculopapular rash on both upper thighs has improved. - Reviewed labs today which showed normal LFTs.  CBC shows hemoglobin 9.4.  Last PSA has increased to 5.43, from 3.57 on 05/22/2022. - We have sent another PSA level from today which we will follow.  If the PSA level increases, will consider increasing Erleada dose to 4 tablets daily.  RTC  8 weeks for follow-up.  Continue degarelix monthly.  2.  Bone metastasis: - Continue calcium supplements.  Calcium is 9.2.  Continue denosumab monthly.  3.  Atrial fibrillation and history of unprovoked right leg DVT: - New warfarin.  No bleeding issues  reported.  4.  Normocytic anemia: - Status post Feraheme x2 on 05/29/2022.  He is taking iron tablet daily.  We have sent iron panel today.  If no improvement will consider parenteral therapy again.  5.  Hypomagnesemia: - Continue magnesium once daily.   Orders placed this encounter:  Orders Placed This Encounter  Procedures   PSA   Ferritin   Iron and TIBC   Vitamin B12   Folate   CBC with Differential/Platelet   Comprehensive metabolic panel   Magnesium      Derek Jack, MD [place] [phone]

## 2022-08-03 NOTE — Patient Instructions (Signed)
[facility]  Discharge Instructions: Thank you for choosing [facility] to provide your oncology and hematology care.  If you have a lab appointment with the Cancer Center, please come in thru the Main Entrance and check in at the main information desk.  Wear comfortable clothing and clothing appropriate for easy access to any Portacath or PICC line.   We strive to give you quality time with your provider. You may need to reschedule your appointment if you arrive late (15 or more minutes).  Arriving late affects you and other patients whose appointments are after yours.  Also, if you miss three or more appointments without notifying the office, you may be dismissed from the clinic at the provider's discretion.      For prescription refill requests, have your pharmacy contact our office and allow 72 hours for refills to be completed.     To help prevent nausea and vomiting after your treatment, we encourage you to take your nausea medication as directed.  BELOW ARE SYMPTOMS THAT SHOULD BE REPORTED IMMEDIATELY: *FEVER GREATER THAN 100.4 F (38 C) OR HIGHER *CHILLS OR SWEATING *NAUSEA AND VOMITING THAT IS NOT CONTROLLED WITH YOUR NAUSEA MEDICATION *UNUSUAL SHORTNESS OF BREATH *UNUSUAL BRUISING OR BLEEDING *URINARY PROBLEMS (pain or burning when urinating, or frequent urination) *BOWEL PROBLEMS (unusual diarrhea, constipation, pain near the anus) TENDERNESS IN MOUTH AND THROAT WITH OR WITHOUT PRESENCE OF ULCERS (sore throat, sores in mouth, or a toothache) UNUSUAL RASH, SWELLING OR PAIN  UNUSUAL VAGINAL DISCHARGE OR ITCHING   Items with * indicate a potential emergency and should be followed up as soon as possible or go to the Emergency Department if any problems should occur.  Please show the CHEMOTHERAPY ALERT CARD or IMMUNOTHERAPY ALERT CARD at check-in to the Emergency Department and triage nurse.  Should you have questions after your visit or need to  cancel or reschedule your appointment, please contact [facility] [phone]  and follow the prompts.  Office hours are 8:00 a.m. to 4:30 p.m. Monday - Friday. Please note that voicemails left after 4:00 p.m. may not be returned until the following business day.  We are closed weekends and major holidays. You have access to a nurse at all times for urgent questions. Please call the main number to the clinic [phone] and follow the prompts.  For any non-urgent questions, you may also contact your provider using MyChart. We now offer e-Visits for anyone 18 and older to request care online for non-urgent symptoms. For details visit [email or web address].   Also download the MyChart app! Go to the app store, search "MyChart", open the app, select [facility], and log in with your MyChart username and password.  Masks are optional in the cancer centers. If you would like for your care team to wear a mask while they are taking care of you, please let them know. You may have one support person who is at least 79 years old accompany you for your appointments.  

## 2022-08-08 ENCOUNTER — Telehealth: Payer: Self-pay | Admitting: Family Medicine

## 2022-08-09 ENCOUNTER — Ambulatory Visit: Payer: No Typology Code available for payment source | Admitting: Family Medicine

## 2022-08-10 ENCOUNTER — Other Ambulatory Visit (HOSPITAL_COMMUNITY): Payer: Self-pay

## 2022-08-16 ENCOUNTER — Telehealth: Payer: Self-pay

## 2022-08-16 ENCOUNTER — Ambulatory Visit (INDEPENDENT_AMBULATORY_CARE_PROVIDER_SITE_OTHER): Payer: No Typology Code available for payment source

## 2022-08-16 DIAGNOSIS — Z0000 Encounter for general adult medical examination without abnormal findings: Secondary | ICD-10-CM | POA: Diagnosis not present

## 2022-08-16 DIAGNOSIS — I422 Other hypertrophic cardiomyopathy: Secondary | ICD-10-CM

## 2022-08-16 DIAGNOSIS — E1159 Type 2 diabetes mellitus with other circulatory complications: Secondary | ICD-10-CM

## 2022-08-16 MED ORDER — BLOOD PRESSURE KIT: 1.0000 | PACK | Freq: Every day | 0 refills | Status: DC

## 2022-08-16 NOTE — Progress Notes (Signed)
MEDICARE ANNUAL WELLNESS VISIT  08/16/2022  Telephone Visit Disclaimer This Medicare AWV was conducted by telephone due to national recommendations for restrictions regarding the COVID-19 Pandemic (e.g. social distancing).  I verified, using two identifiers, that I am speaking with Alan [name]. or their authorized healthcare agent. I discussed the limitations, risks, security, and privacy concerns of performing an evaluation and management service by telephone and the potential availability of an in-person appointment in the future. The patient expressed understanding and agreed to proceed.  Location of Patient: Home Location of Provider (nurse):  [facility]  Subjective:    Alan [name]. is a 81 y.o. male patient of Dettinger, Fransisca Kaufmann, MD who had a Medicare Annual Wellness Visit today via telephone. Alan [name] is Retired and lives with their spouse. He had  four children but two have passed away.  He has one grandson who lives with him and his wife. He reports that he is socially active and does interact with friends/family regularly. He is minimally physically active and enjoys building birdhouses and working with wood.  Patient Care Team: Dettinger, Fransisca Kaufmann, MD as PCP - General (Family Medicine) Belva Crome, MD as PCP - Cardiology (Cardiology) Gala Romney Cristopher Estimable, MD as Consulting Physician (Gastroenterology) Irine Seal, MD as Attending Physician (Urology) Dickie La, RN (Inactive) as [place] [facility] Forrest, Norva Riffle, LCSW as [place] Management (Licensed Clinical Social Worker) Lavera Guise, [facility] (Pharmacist)     08/16/2022    3:29 PM 08/03/2022    2:36 PM 08/03/2022    2:14 PM 07/06/2022   11:21 AM 06/22/2022    3:26 PM 06/09/2022   11:30 AM 05/22/2022    2:00 PM  Advanced Directives  Does Patient Have a Medical Advance Directive? _0  No No  Would patient like information on creating a medical advance directive? No -  Patient declined No - Patient declined No - Patient declined No - Patient declined No - Patient declined No - Patient declined No - Patient declined    Hospital Utilization Over the Past 12 Months: # of hospitalizations or ER visits: 1 # of surgeries: 0  Review of Systems    Patient reports that his overall health is better compared to last year.  History obtained from chart review and the patient  Patient Reported Readings (BP, Pulse, CBG, Weight, etc) none  Pain Assessment Pain : No/denies pain Pain Score: 0-No pain     Current Medications & Allergies (verified) Allergies as of 08/16/2022       Reactions   Allopurinol Other (See Comments)   Significantly decreased WBC's   Colchicine Other (See Comments)   Significantly decreased WBC's        Medication List        Accurate as of August 16, 2022  3:35 PM. If you have any questions, ask your nurse or doctor.          acetaminophen 500 MG tablet Commonly known as: TYLENOL Take 1,000 mg by mouth every 6 (six) hours as needed for moderate pain.   amLODipine 10 MG tablet Commonly known as: NORVASC Take 1 tablet (10 mg total) by mouth every evening.   CALCIUM 600 + D PO Take 1 tablet by mouth in the morning and at bedtime.   calcium carbonate 1500 (600 Ca) MG Tabs tablet Commonly known as: OSCAL Take by mouth 2 (two) times daily with a meal.   chlorthalidone 25 MG  tablet Commonly known as: HYGROTON Take 1 tablet by mouth once daily   diclofenac Sodium 1 % Gel Commonly known as: VOLTAREN Apply 1 application topically 2 (two) times daily as needed (pain).   Erleada 60 MG tablet Generic drug: apalutamide Take 4 tablets (240 mg total) by mouth daily. Take as directed. What changed: how much to take   ferrous sulfate 325 (65 FE) MG tablet Commonly known as: SV Iron Take 1 tablet (325 mg total) by mouth daily with breakfast.   fluticasone 50 MCG/ACT nasal spray Commonly known as: FLONASE Place 1 spray  into both nostrils 2 (two) times daily as needed for allergies or rhinitis.   glucose blood test strip Commonly known as: ONE TOUCH ULTRA TEST Use to check BG once daily.  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 Can take every 4 hours if needed.   lisinopril 40 MG tablet Commonly known as: ZESTRIL Take 1 tablet (40 mg total) by mouth daily.   magnesium oxide 400 (240 Mg) MG tablet Commonly known as: MAG-OX Take 1 tablet (400 mg total) by mouth daily.   metFORMIN 1000 MG tablet Commonly known as: GLUCOPHAGE Take 1 tablet (1,000 mg total) by mouth 2 (two) times daily with a meal.   methylPREDNISolone 4 MG Tbpk tablet Commonly known as: MEDROL DOSEPAK Take as directed   metoprolol succinate 100 MG 24 hr tablet Commonly known as: TOPROL-XL Take 1 tablet (100 mg total) by mouth daily. Take with or immediately following a meal.   omeprazole 20 MG capsule Commonly known as: PRILOSEC Take 1 capsule (20 mg total) by mouth daily.   ONE TOUCH ULTRA MINI w/Device Kit 1 each by Does not apply route 2 (two) times daily.   potassium chloride SA 20 MEQ tablet Commonly known as: KLOR-CON M TAKE 1  BY MOUTH ONCE DAILY Strength: 20 mEq   pravastatin 40 MG tablet Commonly known as: PRAVACHOL Take 1 tablet (40 mg total) by mouth every evening.   warfarin 5 MG tablet Commonly known as: COUMADIN Take as directed by the anticoagulation clinic. If you are unsure how to take this medication, talk to your nurse or doctor. 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   found during hospital stay with pancreatitis,  started coumadin;   last Abd CT in epic 07-29-2020  chronic thrombus portal venous   GERD (gastroesophageal reflux disease)    Heart murmur    History of acute pancreatitis    2016   necrotizing pancreatitis   History of adenomatous polyp of colon    History of DVT of lower extremity    History of GI bleed 08/2019   upper gi bleed due to duodenal ulcer   History of gout yrs ago   Hypertension    followed by pcp   Hypertrophic cardiomyopathy ([place])  followed by cardiology   Internal hemorrhoids    Mixed hyperlipidemia    Normocytic anemia    PAF (paroxysmal atrial fibrillation) ([place]) 04/2016   cardiologist-- dr h. Tamala Julian--  event monitor 07-04-2017 epic, NSR/ PAflutter with occasional RVR/  PACs/ PVCs ;  echo 05-16-2017 epic,  moderate LVH with severe asymptomic septal hypertrophy, G1DD, EF 60-65%,  mild AR, mild LAE   Pancreatic pseudocyst    Peyronie's disease    Prostate cancer [facility]) urologist-- dr wrenn/ oncologist-- dr Tammi Klippel   first dx 08/ 2009 Gleason 3+3 active survelliance;  until bx 07/ 2021  Stage T2b, Gleason 4+3   Right inguinal hernia    Type 2 diabetes mellitus ([place])    followed by pcp---  (11-03-2020 per pt currently not checking blood sugar due to glucose monitor broken)   Wears glasses    Wears partial dentures    upper and lower   Past Surgical History:  Procedure Laterality Date   BIOPSY  02/09/2022   Procedure: BIOPSY;  Surgeon: Milus Banister, MD;  Location: [facility];  Service: Endoscopy;;   CHOLECYSTECTOMY N/A 11/26/2015   Procedure: LAPAROSCOPIC CHOLECYSTECTOMY;  Surgeon: Aviva Signs, MD;  Location: [facility];  Service: General;  Laterality: N/A;   COLONOSCOPY  last one 09-06-2020  dr stark   CYSTOSCOPY N/A 11/09/2020   Procedure: Erlene Quan;   Surgeon: Irine Seal, MD;  Location: [facility];  Service: Urology;  Laterality: N/A;   ESOPHAGOGASTRODUODENOSCOPY  10-06-2019  _0    ESOPHAGOGASTRODUODENOSCOPY N/A 02/09/2022   Procedure: ESOPHAGOGASTRODUODENOSCOPY (EGD);  Surgeon: Milus Banister, MD;  Location: [facility];  Service: Endoscopy;  Laterality: N/A;   EUS N/A 02/09/2022   Procedure: UPPER ENDOSCOPIC ULTRASOUND (EUS) RADIAL;  Surgeon: Milus Banister, MD;  Location: [facility];  Service: Endoscopy;  Laterality: N/A;   FLEXIBLE BRONCHOSCOPY Bilateral 04/14/2016   Procedure: FLEXIBLE BRONCHOSCOPY;  Surgeon: Sinda Du, MD;  [facility];  Service: Cardiopulmonary;  Laterality: Bilateral;   HEMORRHOID SURGERY  yrs ago   RADIOACTIVE SEED IMPLANT N/A 11/09/2020   Procedure: RADIOACTIVE SEED IMPLANT/BRACHYTHERAPY IMPLANT;  Surgeon: Irine Seal, MD;  Location[facility];  Service: Urology;  Laterality: N/A;   SPACE OAR INSTILLATION N/A 11/09/2020   Procedure: SPACE OAR INSTILLATION;  Surgeon: Irine Seal, MD;  Location: [facility];  Service: Urology;  Laterality: N/A;   UMBILICAL HERNIA REPAIR N/A 11/26/2015   Procedure: UMBILICAL HERNIORRHAPHY;  Surgeon: Aviva Signs, MD;  Location: [facility];  Service: General;  Laterality: N/A;   Family History  Problem Relation Age of Onset   Stroke Mother    Hypertension Mother    Alzheimer's disease Mother    Hypertension Father    Brain cancer Sister    Breast cancer Sister    Hypertension Sister    Deep vein thrombosis Brother    Hypertension Brother    Colon cancer Neg Hx    Pancreatic disease Neg Hx    Esophageal cancer Neg Hx    Rectal cancer Neg Hx    Stomach cancer Neg Hx    Social History   Socioeconomic History   Marital status: Married    Spouse name: [name]   Number of children: 4   Years of education: Not on file   Highest education level: 10th grade  Occupational History   Occupation: Retired      Fish farm manager: [employer]  Tobacco Use   Smoking status: Former    Years: 5.00  Types: Cigarettes    Quit date: 12/11/1986    Years since quitting: 35.7   Smokeless tobacco: Never  Vaping Use   Vaping Use: Never used  Substance and Sexual Activity   Alcohol use: No   Drug use: Never   Sexual activity: Yes  Other Topics Concern   Not on file  Social History Narrative   Not on file   Social Determinants of Health   Financial Resource Strain: Low Risk  (08/12/2021)   Overall Financial Resource Strain (CARDIA)    Difficulty of Paying Living Expenses: Not hard at all  Food Insecurity: No Food Insecurity (08/12/2021)   Hunger Vital Sign    Worried About Running Out of Food in the Last Year: Never true    [place] in the Last Year: Never true  Transportation Needs: No Transportation Needs (08/12/2021)   PRAPARE - Hydrologist (Medical): No    Lack of Transportation (Non-Medical): No  Physical Activity: Inactive (10/25/2021)   Exercise Vital Sign    Days of Exercise per Week: 0 days    Minutes of Exercise per Session: 0 min  Stress: Stress Concern Present (10/25/2021)   [place]    Feeling of Stress : To some extent  Social Connections: Socially Integrated (08/12/2021)   Social Connection and Isolation Panel [NHANES]    Frequency of Communication with Friends and Family: Three times a week    Frequency of Social Gatherings with Friends and Family: Three times a week    Attends Religious Services: More than 4 times per year    Active Member of Clubs or Organizations: Yes    Attends Archivist Meetings: More than 4 times per year    Marital Status: Married    Activities of Daily Living    08/16/2022    3:29 PM  In your present state of health, do you have any difficulty performing the following activities:  Hearing? 1  Vision? 0  Difficulty concentrating or making decisions?  0  Walking or climbing stairs? 0  Dressing or bathing? 0  Doing errands, shopping? 0  Preparing Food and eating ? N  Using the Toilet? N  In the past six months, have you accidently leaked urine? N  Do you have problems with loss of bowel control? N  Managing your Medications? N  Managing your Finances? N  Housekeeping or managing your Housekeeping? Y   Patient reports decreased hearing but has not had a formal hearing exam. His son helps he and his wife doing things around their home.  Patient Education/ Literacy How often do you need to have someone help you when you read instructions, pamphlets, or other written materials from your doctor or pharmacy?: 1 - Never  Exercise Current Exercise Habits: The patient does not participate in regular exercise at present, Exercise limited by: None identified  Diet Patient reports consuming 3 meals a day and 2 snack(s) a day Patient reports that his primary diet is: Regular Patient reports that he does have regular access to food.   Depression Screen    07/26/2022   11:32 AM 06/28/2022   12:23 PM 05/29/2022   11:24 AM 04/11/2022   10:48 AM 03/21/2022   10:54 AM 02/20/2022    1:42 PM 01/04/2022    1:22 PM  PHQ 2/9 Scores  PHQ - 2 Score 0 0 0 0 0 0 0  PHQ- 9 Score 0 0  0 1        Fall Risk    08/16/2022    3:34 PM 07/26/2022   11:32 AM 06/28/2022   12:22 PM 05/29/2022   11:24 AM 03/21/2022   10:54 AM  Fall Risk   Falls in the past year? 1 0 0 0 1  Number falls in past yr: 0    1  Injury with Fall? 0    0  Risk for fall due to : History of fall(s)    History of fall(s);Impaired balance/gait  Follow up Falls evaluation completed    Falls evaluation completed     Objective:  Alan [name]. seemed alert and oriented and he participated appropriately during our telephone visit.  Blood Pressure Weight BMI  BP Readings from Last 3 Encounters:  08/03/22 128/63  07/26/22 (!) 140/57  07/06/22 (!) 146/46   Wt Readings from Last 3  Encounters:  08/03/22 144 lb 9.6 oz (65.6 kg)  07/26/22 146 lb (66.2 kg)  06/28/22 145 lb (65.8 kg)   BMI Readings from Last 1 Encounters:  08/03/22 24.06 kg/m    *Unable to obtain current vital signs, weight, and BMI due to telephone visit type  Hearing/Vision  Mohamed did not seem to have difficulty with hearing/understanding during the telephone conversation Reports that he has not had a formal eye exam by an eye care professional within the past year Reports that he has not had a formal hearing evaluation within the past year *Unable to fully assess hearing and vision during telephone visit type  Cognitive Function:    08/16/2022    3:31 PM 08/12/2021    4:24 PM 04/09/2020    2:45 PM 04/09/2019   11:53 AM  6CIT Screen  What Year? 0 points 0 points 0 points 0 points  What month? 3 points 0 points 3 points 0 points  What time? 0 points 0 points 0 points 0 points  Count back from 20 2 points 0 points 0 points 0 points  Months in reverse 0 points 0 points 2 points 0 points  Repeat phrase 6 points 4 points 0 points 0 points  Total Score 11 points 4 points 5 points 0 points   (Normal:0-7, Significant for Dysfunction: >8)  Normal Cognitive Function Screening: Yes   Immunization & Health Maintenance Record Immunization History  Administered Date(s) Administered   Fluad Quad(high Dose 65+) 09/03/2019, 09/13/2020, 09/21/2021   Influenza, High Dose Seasonal PF 09/18/2018   Influenza,inj,Quad PF,6+ Mos 10/02/2013, 11/12/2014, 09/08/2015, 08/31/2016, 09/11/2017   Moderna Sars-Covid-2 Vaccination 01/15/2020, 02/16/2020, 10/13/2020   Pneumococcal Conjugate-13 03/08/2015   Pneumococcal Polysaccharide-23 01/10/2013, 09/01/2019   Tdap 01/10/2013    Health Maintenance  Topic Date Due   OPHTHALMOLOGY EXAM  05/30/2022   FOOT EXAM  06/29/2022   Zoster Vaccines- Shingrix (1 of 2) 08/29/2022 (Originally 01/20/1960)   Hepatitis C Screening  09/21/2022 (Originally 01/20/1959)   INFLUENZA  VACCINE  10/13/2022 (Originally 07/11/2022)   COVID-19 Vaccine (4 - Moderna risk series) 02/19/2023 (Originally 12/08/2020)   HEMOGLOBIN A1C  11/28/2022   TETANUS/TDAP  01/10/2023   COLONOSCOPY (Pts 45-62yr Insurance coverage will need to be confirmed)  09/06/2025   Pneumonia Vaccine 81 Years old  Completed   HPV VACCINES  Aged Out       Assessment  This is a routine wellness examination for [employer]Marland Kitchen Health Maintenance: Due or Overdue Health Maintenance Due  Topic Date Due   OPHTHALMOLOGY EXAM  05/30/2022   FOOT EXAM  06/29/2022  Alan [name]. does not need a referral for Community Assistance: Care Management:   no Social Work:    no Prescription Assistance:  no Nutrition/Diabetes Education:  no   Plan:  Personalized Goals  Goals Addressed             This Visit's Progress    Patient Stated       08/16/2022 AWV Goal: Fall Prevention  Over the next year, patient will decrease their risk for falls by: Using assistive devices, such as a cane or walker, as needed Identifying fall risks within their home and correcting them by: Removing throw rugs Adding handrails to stairs or ramps Removing clutter and keeping a clear pathway throughout the home Increasing light, especially at night Adding shower handles/bars Raising toilet seat Identifying potential personal risk factors for falls: Medication side effects Incontinence/urgency Vestibular dysfunction Hearing loss Musculoskeletal disorders Neurological disorders Orthostatic hypotension         Personalized Health Maintenance & Screening Recommendations  Influenza vaccine Shingrix vaccine Hepatitis C screening  Lung Cancer Screening Recommended: no (Low Dose CT Chest recommended if Age 56-80 years, 30 pack-year currently smoking OR have quit w/in past 15 years) Hepatitis C Screening recommended: yes HIV Screening recommended: no  Advanced Directives: Written information was not prepared per  patient's request.  Referrals & Orders No orders of the defined types were placed in this encounter.   Follow-up Plan Follow-up with Dettinger, Fransisca Kaufmann, MD as planned Schedule eye exam    I have personally reviewed and noted the following in the patient's chart:   Medical and social history Use of alcohol, tobacco or illicit drugs  Current medications and supplements Functional ability and status Nutritional status Physical activity Advanced directives List of other physicians Hospitalizations, surgeries, and ER visits in previous 12 months Vitals Screenings to include cognitive, depression, and falls Referrals and appointments  In addition, I have reviewed and discussed with Alan [name]. certain preventive protocols, quality metrics, and best practice recommendations. A written personalized care plan for preventive services as well as general preventive health recommendations is available and can be mailed to the patient at his request.      Burnadette Pop  08/16/2022  Patient declines after visit summary

## 2022-08-16 NOTE — Telephone Encounter (Signed)
Called patient he is aware.

## 2022-08-16 NOTE — Telephone Encounter (Signed)
Patient reports that his blood pressure monitor he has at home has stopped working and would like you to send in a prescription for a new one to [facility] so he can see if his insurance company will pay for it.

## 2022-08-16 NOTE — Telephone Encounter (Signed)
Sent prescription of blood pressure cuff for the patient

## 2022-08-21 ENCOUNTER — Encounter: Payer: Self-pay | Admitting: Family Medicine

## 2022-08-21 ENCOUNTER — Ambulatory Visit (INDEPENDENT_AMBULATORY_CARE_PROVIDER_SITE_OTHER): Payer: No Typology Code available for payment source | Admitting: Family Medicine

## 2022-08-21 VITALS — BP 162/57 | HR 66 | Temp 97.4°F | Ht 65.0 in | Wt 143.0 lb

## 2022-08-21 DIAGNOSIS — I480 Paroxysmal atrial fibrillation: Secondary | ICD-10-CM

## 2022-08-21 DIAGNOSIS — D6859 Other primary thrombophilia: Secondary | ICD-10-CM

## 2022-08-21 DIAGNOSIS — Z7901 Long term (current) use of anticoagulants: Secondary | ICD-10-CM | POA: Diagnosis not present

## 2022-08-21 DIAGNOSIS — Z86718 Personal history of other venous thrombosis and embolism: Secondary | ICD-10-CM | POA: Diagnosis not present

## 2022-08-21 LAB — COAGUCHEK XS/INR WAIVED
INR: 1.5 — ABNORMAL HIGH (ref 0.9–1.1)
Prothrombin Time: 18.3 s

## 2022-08-21 MED ORDER — WARFARIN SODIUM 10 MG PO TABS: 10.0000 mg | ORAL_TABLET | Freq: Every day | ORAL | 3 refills | Status: DC

## 2022-08-21 NOTE — Progress Notes (Signed)
BP (!) 162/57   Pulse 66   Temp (!) 97.4 F (36.3 C)   Ht _0  (1.651 m)   Wt 143 lb (64.9 kg)   SpO2 100%   BMI 23.80 kg/m    Subjective:   Patient ID: Alan [name]., male    DOB: 06/25/1941, 81 y.o.   MRN: [id number]  HPI: Alan [name]. is a 81 y.o. male presenting on 08/21/2022 for Atrial Fibrillation   HPI Coumadin recheck Target goal: 2.0-3.0 Reason on anticoagulation: Paroxysmal A-fib with history of DVTs Patient denies any bruising or bleeding or chest pain or palpitations   Hypertension Blood pressure elevated today but patient says it runs normal at home, just up in the office.  Has whitecoat hypertension.  We will monitor closely at home.  Allowing permissive hypertension as well because of low diastolic blood pressure  Relevant past medical, surgical, family and social history reviewed and updated as indicated. Interim medical history since our last visit reviewed. Allergies and medications reviewed and updated.  Review of Systems  Constitutional:  Negative for chills and fever.  Eyes:  Negative for visual disturbance.  Respiratory:  Negative for shortness of breath and wheezing.   Cardiovascular:  Negative for chest pain and leg swelling.  Gastrointestinal:  Negative for blood in stool.  Genitourinary:  Negative for hematuria.  Musculoskeletal:  Negative for back pain and gait problem.  Skin:  Negative for rash.  Neurological:  Negative for dizziness, weakness and light-headedness.  All other systems reviewed and are negative.   Per HPI unless specifically indicated above   Allergies as of 08/21/2022       Reactions   Allopurinol Other (See Comments)   Significantly decreased WBC's   Colchicine Other (See Comments)   Significantly decreased WBC's        Medication List        Accurate as of August 21, 2022 11:51 AM. If you have any questions, ask your nurse or doctor.          STOP taking these medications    methylPREDNISolone 4  MG Tbpk tablet Commonly known as: MEDROL DOSEPAK Stopped by: Fransisca Kaufmann Lamiracle Chaidez, MD       TAKE these medications    acetaminophen 500 MG tablet Commonly known as: TYLENOL Take 1,000 mg by mouth every 6 (six) hours as needed for moderate pain.   amLODipine 10 MG tablet Commonly known as: NORVASC Take 1 tablet (10 mg total) by mouth every evening.   Blood Pressure Kit 1 each by Does not apply route daily.   CALCIUM 600 + D PO Take 1 tablet by mouth in the morning and at bedtime.   calcium carbonate 1500 (600 Ca) MG Tabs tablet Commonly known as: OSCAL Take by mouth 2 (two) times daily with a meal.   chlorthalidone 25 MG tablet Commonly known as: HYGROTON Take 1 tablet by mouth once daily   diclofenac Sodium 1 % Gel Commonly known as: VOLTAREN Apply 1 application topically 2 (two) times daily as needed (pain).   Erleada 60 MG tablet Generic drug: apalutamide Take 4 tablets (240 mg total) by mouth daily. Take as directed. What changed: how much to take   ferrous sulfate 325 (65 FE) MG tablet Commonly known as: SV Iron Take 1 tablet (325 mg total) by mouth daily with breakfast.   fluticasone 50 MCG/ACT nasal spray Commonly known as: FLONASE Place 1 spray into both nostrils 2 (two) times daily as needed for allergies or  rhinitis.   glucose blood test strip Commonly known as: ONE TOUCH ULTRA TEST Use to check BG once daily.  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 Can take every 4 hours if needed.   lisinopril 40 MG tablet Commonly known as: ZESTRIL Take 1 tablet (40 mg total) by mouth daily.   magnesium oxide 400 (240 Mg) MG tablet Commonly known as: MAG-OX Take 1 tablet (400 mg total) by mouth daily.   metFORMIN 1000 MG tablet Commonly known as: GLUCOPHAGE Take 1 tablet (1,000 mg total) by mouth 2 (two) times daily  with a meal.   metoprolol succinate 100 MG 24 hr tablet Commonly known as: TOPROL-XL Take 1 tablet (100 mg total) by mouth daily. Take with or immediately following a meal.   omeprazole 20 MG capsule Commonly known as: PRILOSEC Take 1 capsule (20 mg total) by mouth daily.   ONE TOUCH ULTRA MINI w/Device Kit 1 each by Does not apply route 2 (two) times daily.   potassium chloride SA 20 MEQ tablet Commonly known as: KLOR-CON M TAKE 1  BY MOUTH ONCE DAILY Strength: 20 mEq   pravastatin 40 MG tablet Commonly known as: PRAVACHOL Take 1 tablet (40 mg total) by mouth every evening.   warfarin 10 MG tablet Commonly known as: Coumadin Take as directed by the anticoagulation clinic. If you are unsure how to take this medication, talk to your nurse or doctor. Original instructions: Take 1 tablet (10 mg total) by mouth daily. What changed:  medication strength how much to take how to take this when to take this additional instructions Changed by: Fransisca Kaufmann Jeris Easterly, MD         Objective:   BP (!) 162/57   Pulse 66   Temp (!) 97.4 F (36.3 C)   Ht _0  (1.651 m)   Wt 143 lb (64.9 kg)   SpO2 100%   BMI 23.80 kg/m   Wt Readings from Last 3 Encounters:  08/21/22 143 lb (64.9 kg)  08/03/22 144 lb 9.6 oz (65.6 kg)  07/26/22 146 lb (66.2 kg)    Physical Exam Vitals and nursing note reviewed.  Constitutional:      Appearance: Normal appearance. He is not ill-appearing or diaphoretic.  Skin:    General: Skin is warm.     Coloration: Skin is not jaundiced or pale.     Findings: No bruising.  Neurological:     Mental Status: He is alert.       Assessment & Plan:   Problem List Items Addressed This Visit       Cardiovascular and Mediastinum   Paroxysmal atrial fibrillation (HCC) - Primary   Relevant Medications   warfarin (COUMADIN) 10 MG tablet   Other Relevant Orders   CoaguChek XS/INR Waived     Hematopoietic and Hemostatic   Primary hypercoagulable  state ([place]) [D68.59]     Other   Long term (current) use of anticoagulants [Z79.01]   History of DVT (deep vein thrombosis)    Description   Increase dose to take 10 mg on every day of the week.   INR was 1.5 (goal 2-3)   Follow-up 2-3weeks     Follow up plan: Return if symptoms worsen or fail to improve, for 2 to 3-week INR recheck.  Counseling provided for all of the vaccine components Orders Placed This Encounter  Procedures   CoaguChek XS/INR Shields Leoma Folds, MD [facility] 08/21/2022, 11:51 AM

## 2022-08-31 ENCOUNTER — Encounter: Payer: Self-pay | Admitting: Urology

## 2022-08-31 ENCOUNTER — Inpatient Hospital Stay: Payer: No Typology Code available for payment source

## 2022-08-31 ENCOUNTER — Ambulatory Visit (INDEPENDENT_AMBULATORY_CARE_PROVIDER_SITE_OTHER): Payer: No Typology Code available for payment source | Admitting: Urology

## 2022-08-31 ENCOUNTER — Inpatient Hospital Stay: Payer: No Typology Code available for payment source | Attending: Hematology

## 2022-08-31 VITALS — BP 159/65 | HR 90

## 2022-08-31 VITALS — BP 139/59 | HR 90 | Temp 98.2°F | Resp 18

## 2022-08-31 DIAGNOSIS — D649 Anemia, unspecified: Secondary | ICD-10-CM | POA: Insufficient documentation

## 2022-08-31 DIAGNOSIS — N179 Acute kidney failure, unspecified: Secondary | ICD-10-CM

## 2022-08-31 DIAGNOSIS — C61 Malignant neoplasm of prostate: Secondary | ICD-10-CM | POA: Diagnosis not present

## 2022-08-31 DIAGNOSIS — R351 Nocturia: Secondary | ICD-10-CM

## 2022-08-31 DIAGNOSIS — Z79899 Other long term (current) drug therapy: Secondary | ICD-10-CM | POA: Insufficient documentation

## 2022-08-31 DIAGNOSIS — C7951 Secondary malignant neoplasm of bone: Secondary | ICD-10-CM | POA: Diagnosis not present

## 2022-08-31 DIAGNOSIS — N403 Nodular prostate with lower urinary tract symptoms: Secondary | ICD-10-CM

## 2022-08-31 DIAGNOSIS — R9721 Rising PSA following treatment for malignant neoplasm of prostate: Secondary | ICD-10-CM

## 2022-08-31 LAB — COMPREHENSIVE METABOLIC PANEL
ALT: 11 U/L (ref 0–44)
AST: 22 U/L (ref 15–41)
Albumin: 3.5 g/dL (ref 3.5–5.0)
Alkaline Phosphatase: 43 U/L (ref 38–126)
Anion gap: 13 (ref 5–15)
BUN: 24 mg/dL — ABNORMAL HIGH (ref 8–23)
CO2: 25 mmol/L (ref 22–32)
Calcium: 9.4 mg/dL (ref 8.9–10.3)
Chloride: 100 mmol/L (ref 98–111)
Creatinine, Ser: 1.3 mg/dL — ABNORMAL HIGH (ref 0.61–1.24)
GFR, Estimated: 56 mL/min — ABNORMAL LOW (ref >60)
Glucose, Bld: 144 mg/dL — ABNORMAL HIGH (ref 70–99)
Potassium: 3.6 mmol/L (ref 3.5–5.1)
Sodium: 138 mmol/L (ref 135–145)
Total Bilirubin: 0.5 mg/dL (ref 0.3–1.2)
Total Protein: 6.5 g/dL (ref 6.5–8.1)

## 2022-08-31 MED ORDER — DENOSUMAB 120 MG/1.7ML ~~LOC~~ SOLN
120.0000 mg | Freq: Once | SUBCUTANEOUS | Status: AC
  Administered 2022-08-31: 120 mg via SUBCUTANEOUS
  Filled 2022-08-31: qty 1.7

## 2022-08-31 MED ORDER — DEGARELIX ACETATE 80 MG ~~LOC~~ SOLR
80.0000 mg | Freq: Once | SUBCUTANEOUS | Status: AC
  Administered 2022-08-31: 80 mg via SUBCUTANEOUS

## 2022-08-31 NOTE — Patient Instructions (Signed)
[facility]  Discharge Instructions: Thank you for choosing [facility] to provide your oncology and hematology care.  If you have a lab appointment with the Cancer Center, please come in thru the Main Entrance and check in at the main information desk.  Wear comfortable clothing and clothing appropriate for easy access to any Portacath or PICC line.   We strive to give you quality time with your provider. You may need to reschedule your appointment if you arrive late (15 or more minutes).  Arriving late affects you and other patients whose appointments are after yours.  Also, if you miss three or more appointments without notifying the office, you may be dismissed from the clinic at the provider's discretion.      For prescription refill requests, have your pharmacy contact our office and allow 72 hours for refills to be completed.    Today you received the following chemotherapy and/or immunotherapy agents Xgeva.  Denosumab Injection (Oncology) What is this medication? DENOSUMAB (den oh SUE mab) prevents weakened bones caused by cancer. It may also be used to treat noncancerous bone tumors that cannot be removed by surgery. It can also be used to treat high calcium levels in the blood caused by cancer. It works by blocking a protein that causes bones to break down quickly. This slows down the release of calcium from bones, which lowers calcium levels in your blood. It also makes your bones stronger and less likely to break (fracture). This medicine may be used for other purposes; ask your health care provider or pharmacist if you have questions. COMMON BRAND NAME(S): XGEVA What should I tell my care team before I take this medication? They need to know if you have any of these conditions: Dental disease Having surgery or tooth extraction Infection Kidney disease Low levels of calcium or vitamin D in the blood Malnutrition On hemodialysis Skin conditions  or sensitivity Thyroid or parathyroid disease An unusual reaction to denosumab, other medications, foods, dyes, or preservatives Pregnant or trying to get pregnant Breast-feeding How should I use this medication? This medication is for injection under the skin. It is given by your care team in a hospital or clinic setting. A special MedGuide will be given to you before each treatment. Be sure to read this information carefully each time. Talk to your care team about the use of this medication in children. While it may be prescribed for children as young as 13 years for selected conditions, precautions do apply. Overdosage: If you think you have taken too much of this medicine contact a poison control center or emergency room at once. NOTE: This medicine is only for you. Do not share this medicine with others. What if I miss a dose? Keep appointments for follow-up doses. It is important not to miss your dose. Call your care team if you are unable to keep an appointment. What may interact with this medication? Do not take this medication with any of the following: Other medications containing denosumab This medication may also interact with the following: Medications that lower your chance of fighting infection Steroid medications, such as prednisone or cortisone This list may not describe all possible interactions. Give your health care provider a list of all the medicines, herbs, non-prescription drugs, or dietary supplements you use. Also tell them if you smoke, drink alcohol, or use illegal drugs. Some items may interact with your medicine. What should I watch for while using this medication? Your condition will   be monitored carefully while you are receiving this medication. You may need blood work while taking this medication. This medication may increase your risk of getting an infection. Call your care team for advice if you get a fever, chills, sore throat, or other symptoms of a cold or  flu. Do not treat yourself. Try to avoid being around people who are sick. You should make sure you get enough calcium and vitamin D while you are taking this medication, unless your care team tells you not to. Discuss the foods you eat and the vitamins you take with your care team. Some people who take this medication have severe bone, joint, or muscle pain. This medication may also increase your risk for jaw problems or a broken thigh bone. Tell your care team right away if you have severe pain in your jaw, bones, joints, or muscles. Tell your care team if you have any pain that does not go away or that gets worse. Talk to your care team if you may be pregnant. Serious birth defects can occur if you take this medication during pregnancy and for 5 months after the last dose. You will need a negative pregnancy test before starting this medication. Contraception is recommended while taking this medication and for 5 months after the last dose. Your care team can help you find the option that works for you. What side effects may I notice from receiving this medication? Side effects that you should report to your care team as soon as possible: Allergic reactions--skin rash, itching, hives, swelling of the face, lips, tongue, or throat Bone, joint, or muscle pain Low calcium level--muscle pain or cramps, confusion, tingling, or numbness in the hands or feet Osteonecrosis of the jaw--pain, swelling, or redness in the mouth, numbness of the jaw, poor healing after dental work, unusual discharge from the mouth, visible bones in the mouth Side effects that usually do not require medical attention (report to your care team if they continue or are bothersome): Cough Diarrhea Fatigue Headache Nausea This list may not describe all possible side effects. Call your doctor for medical advice about side effects. You may report side effects to FDA at 1-800-FDA-1088. Where should I keep my medication? This medication  is given in a hospital or clinic. It will not be stored at home. NOTE: This sheet is a summary. It may not cover all possible information. If you have questions about this medicine, talk to your doctor, pharmacist, or health care provider.  2023 Elsevier/Gold Standard (2022-04-17 00:00:00)        To help prevent nausea and vomiting after your treatment, we encourage you to take your nausea medication as directed.  BELOW ARE SYMPTOMS THAT SHOULD BE REPORTED IMMEDIATELY: *FEVER GREATER THAN 100.4 F (38 C) OR HIGHER *CHILLS OR SWEATING *NAUSEA AND VOMITING THAT IS NOT CONTROLLED WITH YOUR NAUSEA MEDICATION *UNUSUAL SHORTNESS OF BREATH *UNUSUAL BRUISING OR BLEEDING *URINARY PROBLEMS (pain or burning when urinating, or frequent urination) *BOWEL PROBLEMS (unusual diarrhea, constipation, pain near the anus) TENDERNESS IN MOUTH AND THROAT WITH OR WITHOUT PRESENCE OF ULCERS (sore throat, sores in mouth, or a toothache) UNUSUAL RASH, SWELLING OR PAIN  UNUSUAL VAGINAL DISCHARGE OR ITCHING   Items with * indicate a potential emergency and should be followed up as soon as possible or go to the Emergency Department if any problems should occur.  Please show the CHEMOTHERAPY ALERT CARD or IMMUNOTHERAPY ALERT CARD at check-in to the Emergency Department and triage nurse.  Should you have   questions after your visit or need to cancel or reschedule your appointment, please contact [place] [phone]  and follow the prompts.  Office hours are 8:00 a.m. to 4:30 p.m. Monday - Friday. Please note that voicemails left after 4:00 p.m. may not be returned until the following business day.  We are closed weekends and major holidays. You have access to a nurse at all times for urgent questions. Please call the main number to the clinic [phone] and follow the prompts.  For any non-urgent questions, you may also contact your provider using MyChart. We now offer e-Visits for anyone 2  and older to request care online for non-urgent symptoms. For details visit [email or web address].   Also download the MyChart app! Go to the app store, search "MyChart", open the app, select [place], and log in with your MyChart username and password.  Masks are optional in the cancer centers. If you would like for your care team to wear a mask while they are taking care of you, please let them know. You may have one support person who is at least 79 years old accompany you for your appointments.

## 2022-08-31 NOTE — Progress Notes (Signed)
Patient tolerated Xgeva injection with no complaints voiced.  Site clean and dry with no bruising or swelling noted.  No complaints of pain.  Discharged with vital signs stable and no signs or symptoms of distress noted.  

## 2022-08-31 NOTE — Progress Notes (Addendum)
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   Medication: Firmagon (Degarelix)  Dose: 80mg Location: right upper abdomen  Patient tolerated well, no complications were noted  Performed by: Tishara Pizano LPN  Follow up: 1 month NV 

## 2022-08-31 NOTE — Progress Notes (Signed)
Subjective:  1. Prostate cancer ([place])   2. Nodular prostate with lower urinary tract symptoms   3. 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 Rising PSA following treatment for malignant neoplasm of prostate   5. Nocturia   6. 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   His PSA has declined to 5.4 from 8.1 in 3/22.  He is voiding ok with moderate LUTS and an IPSS of 14 with nocturia x 3.   He has had no further gross hematuria and his UA is unremarkable today.   He remains on warfarin and his INR is 2.4 on 05/12/21.     On initial biopsy, he had only 5% in one core and elected active survelliance. He had a repeat biopsy on 12/24/13 that showed a single core with <5% Gleason 6 in the right mid lateral prostate. His last biopsy was on 06/28/18 and only showed 2 cores with atypia. He is on finasteride and has had  a slowly rising PSA that was up to 6.4 prior to his most recent biopsy on 06/25/20.  The nadir on finasteride was 1.8.    The repeat biopsy demonstrated 6 positive cores with 2 cores with 7(4+3) on the right and 4 cores with low volume Gleason 6 disease.    His prostate volume is 58m. IPSS is 9.  A bone scan on 07/06/20 was negative.    The CT on 07/29/20 was negative for mets.   His PSA was 6.2 prior to therapy starting finasteride.   11/24/21:  Alan [name] today in f/u for the history above.  His PSA was 5.4 in 6/22 but a repeat prior to this visit was 53.2 on 11/17/21 and then 66.7 on 11/22/21.  He has no weight loss but he has some new hip pain for the last 3 weeks.  He has no new voiding complaint but he has urgency with UUI.  His UA is unremarkable today.   01/12/22: Alan [name] today in f/u.  He was found to have a  rapidly rising PSA and was restaged.  The bone scan showed a possible thoracic vertebral met but this was not confirmed by Chest CT.   The CT [facility] showed no evidence of mets but a new peripancreatic process so GI was consulted.   An abdominal MRI was obtained that showed multiple mets in the pelvic bones.   He is scheduled for upper endoscopy for the pancreatic lesion on 02/09/22.    02/16/22: Alan [name] today in f/u for his history of metastatic prostate cancer.  He got an initial dose of Firmagon on 01/12/22.   His PSA was 382 prior to the shot.   He had labs on 01/26/22 with Oncology and his PSA was down to 72 but he has an elevated Alk phos of 392 and a hgb of 9.5.  Apalutamide has been prescribed and he is tolerating it well without fatigue but he has some constipation.  He has no hot flashes.  He has back pain after a fall about 2 weeks ago and has to sleep sitting up but it is slowly improving but he had no fractures.   He had an upper endoscopy last  week and had a negative biopsy in the stomach.  He didn't have a biopsy of the pancreas.  He eats well.   He is due for firmagon 90m today and has f/u on 3/20 with Dr. KDelton Coombes   05/25/22: Alan [name] returns today in f/u for his history of metastatic prostate cancer.  He remains on firmagon 817mmonthly and last got an injection on 04/20/22 and is due today.  He remains on apalutamide with Dr. KaDelton Coombes His PSA is 3.57 on 05/22/22 which is minimally increased from 3.26 on 05/09/22 but down from 72.3 on 01/26/22.  He is voiding without difficulty.  He has some nocturia. He has some mild left lateral lower leg pain.  He has last lost some weight over the last few months.   He has no neurologic complaints.   He is not having hot flashes.   His glucose was 202 on 6/12 and his Albumin was 3.2.  He has had a progressive anemia with a hgb of  7.8 on 6/12 and he got a unit of PRBC's.    08/31/22: Alan [name] returns today for firmagon for continued therapy of metastatic prostate  cancer.  He is on apalutamide as well.  His last PSA in 8/23 was up to 14.94.  He last had imaging with a CT on 06/02/22 that showed persistent wide spread osseous mets.  His weight is stable.  He is not having bone pain.  He has no hot flashes.  He is voiding well with an IPSS of 8.  He had labs on 08/31/22 and his Cr was up to 1.3.  He has no flank pain or hematuria.   He remains on xgeva.   He has a diffuse skin rash on the erleada and has been using cortisone.  His UA is clear.    IPSS     Row Name 08/31/22 1500         International Prostate Symptom Score   How often have you had the sensation of not emptying your bladder? Less than half the time     How often have you had to urinate less than every two hours? About half the time     How often have you found you stopped and started again several times when you urinated? Not at All     How often have you found it difficult to postpone urination? Not at All     How often have you had a weak urinary stream? Not at All     How often have you had to strain to start urination? Less than 1 in 5 times     How many times did you typically get up at night to urinate? 2 Times     Total IPSS Score 8       Quality of Life due to urinary symptoms   If you were to spend the rest of your life with your urinary condition just the way it is now how would you feel about that? Pleased                ROS:  ROS:  A complete review of systems was performed.  All systems are negative except for pertinent findings as noted.   Review of Systems  Skin:  Positive for rash (on both arms and back since he has been on erleada.).  All other systems reviewed and are negative.   Allergies  Allergen Reactions   Allopurinol Other (See Comments)    Significantly decreased  WBC's   Colchicine Other (See Comments)    Significantly decreased WBC's    Outpatient Encounter Medications as of 08/31/2022  Medication Sig   acetaminophen (TYLENOL) 500 MG tablet  Take 1,000 mg by mouth every 6 (six) hours as needed for moderate pain.   amLODipine (NORVASC) 10 MG tablet Take 1 tablet (10 mg total) by mouth every evening.   apalutamide (ERLEADA) 60 MG tablet Take 4 tablets (240 mg total) by mouth daily. Take as directed. (Patient taking differently: Take 180 mg by mouth daily. Take as directed.)   Blood Glucose Monitoring Suppl (ONE TOUCH ULTRA MINI) w/Device KIT 1 each by Does not apply route 2 (two) times daily.   Blood Pressure KIT 1 each by Does not apply route daily.   Calcium Carb-Cholecalciferol (CALCIUM 600 + D PO) Take 1 tablet by mouth in the morning and at bedtime.   calcium carbonate (OSCAL) 1500 (600 Ca) MG TABS tablet Take by mouth 2 (two) times daily with a meal.   chlorthalidone (HYGROTON) 25 MG tablet Take 1 tablet by mouth once daily   diclofenac Sodium (VOLTAREN) 1 % GEL Apply 1 application topically 2 (two) times daily as needed (pain).   ferrous sulfate (SV IRON) 325 (65 FE) MG tablet Take 1 tablet (325 mg total) by mouth daily with breakfast.   fluticasone (FLONASE) 50 MCG/ACT nasal spray Place 1 spray into both nostrils 2 (two) times daily as needed for allergies or rhinitis.   glucose blood (ONE TOUCH ULTRA TEST) test strip Use to check BG once daily.  Dx:  Type 2 DM controlled E11.9   HYDROcodone-acetaminophen (NORCO/VICODIN) 5-325 MG tablet Take 1 tablet by mouth every 6 (six) hours as needed for moderate pain. Can take every 4 hours if needed.   lisinopril (ZESTRIL) 40 MG tablet Take 1 tablet (40 mg total) by mouth daily.   magnesium oxide (MAG-OX) 400 (240 Mg) MG tablet Take 1 tablet (400 mg total) by mouth daily.   metFORMIN (GLUCOPHAGE) 1000 MG tablet Take 1 tablet (1,000 mg total) by mouth 2 (two) times daily with a meal.   metoprolol succinate (TOPROL-XL) 100 MG 24 hr tablet Take 1 tablet (100 mg total) by mouth daily. Take with or immediately following a meal.   omeprazole (PRILOSEC) 20 MG capsule Take 1 capsule (20 mg total)  by mouth daily.   potassium chloride SA (KLOR-CON M) 20 MEQ tablet TAKE 1  BY MOUTH ONCE DAILY Strength: 20 mEq   pravastatin (PRAVACHOL) 40 MG tablet Take 1 tablet (40 mg total) by mouth every evening.   warfarin (COUMADIN) 10 MG tablet Take 1 tablet (10 mg total) by mouth daily.   [EXPIRED] degarelix (FIRMAGON) injection 80 mg    No facility-administered encounter medications on file as of 08/31/2022.    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   found during hospital stay with pancreatitis,  started coumadin;   last Abd CT in epic 07-29-2020  chronic thrombus portal venous   GERD (gastroesophageal reflux disease)    Heart murmur    History of acute pancreatitis    2016   necrotizing pancreatitis   History of adenomatous polyp of colon    History of DVT  of lower extremity    History of GI bleed 08/2019   upper gi bleed due to duodenal ulcer   History of gout yrs ago   Hypertension    followed by pcp   Hypertrophic cardiomyopathy ([place])    followed by cardiology   Internal hemorrhoids    Mixed hyperlipidemia    Normocytic anemia    PAF (paroxysmal atrial fibrillation) ([place]) 04/2016   cardiologist-- dr h. Tamala Julian--  event monitor 07-04-2017 epic, NSR/ PAflutter with occasional RVR/  PACs/ PVCs ;  echo 05-16-2017 epic,  moderate LVH with severe asymptomic septal hypertrophy, G1DD, EF 60-65%,  mild AR, mild LAE   Pancreatic pseudocyst    Peyronie's disease    Prostate [facility]) urologist-- dr Siyona Coto/ oncologist-- dr Tammi Klippel   first dx 08/ 2009 Gleason 3+3 active survelliance;  until bx 07/ 2021  Stage T2b, Gleason 4+3   Right inguinal hernia    Type 2 diabetes mellitus ([place])    followed by pcp---  (11-03-2020 per pt currently not checking  blood sugar due to glucose monitor broken)   Wears glasses    Wears partial dentures    upper and lower    Past Surgical History:  Procedure Laterality Date   BIOPSY  02/09/2022   Procedure: BIOPSY;  Surgeon: Milus Banister, MD;  Location: [facility];  Service: Endoscopy;;   CHOLECYSTECTOMY N/A 11/26/2015   Procedure: LAPAROSCOPIC CHOLECYSTECTOMY;  Surgeon: Aviva Signs, MD;  Location: [facility];  Service: General;  Laterality: N/A;   COLONOSCOPY  last one 09-06-2020  dr stark   CYSTOSCOPY N/A 11/09/2020   Procedure: Erlene Quan;  Surgeon: Irine Seal, MD;  Location: [facility];  Service: Urology;  Laterality: N/A;   ESOPHAGOGASTRODUODENOSCOPY  10-06-2019  _0    ESOPHAGOGASTRODUODENOSCOPY N/A 02/09/2022   Procedure: ESOPHAGOGASTRODUODENOSCOPY (EGD);  Surgeon: Milus Banister, MD;  Location: [facility];  Service: Endoscopy;  Laterality: N/A;   EUS N/A 02/09/2022   Procedure: UPPER ENDOSCOPIC ULTRASOUND (EUS) RADIAL;  Surgeon: Milus Banister, MD;  Location: [facility];  Service: Endoscopy;  Laterality: N/A;   FLEXIBLE BRONCHOSCOPY Bilateral 04/14/2016   Procedure: FLEXIBLE BRONCHOSCOPY;  Surgeon: Sinda Du, MD;  Location[facility];  Service: Cardiopulmonary;  Laterality: Bilateral;   HEMORRHOID SURGERY  yrs ago   RADIOACTIVE SEED IMPLANT N/A 11/09/2020   Procedure: RADIOACTIVE SEED IMPLANT/BRACHYTHERAPY IMPLANT;  Surgeon: Irine Seal, MD;  Location[facility];  Service: Urology;  Laterality: N/A;   SPACE OAR INSTILLATION N/A 11/09/2020   Procedure: SPACE OAR INSTILLATION;  Surgeon: Irine Seal, MD;  Location: [facility];  Service: Urology;  Laterality: N/A;   UMBILICAL HERNIA REPAIR N/A 11/26/2015   Procedure: UMBILICAL HERNIORRHAPHY;  Surgeon: Aviva Signs, MD;  Location: [facility];  Service: General;  Laterality: N/A;    Social History   Socioeconomic History   Marital status: Married    Spouse name: [name]   Number  of children: 4   Years of education: Not on file   Highest education level: 10th grade  Occupational History   Occupation: Retired     Fish farm manager: [employer]  Tobacco Use   Smoking status: Former    Years: 5.00    Types: Cigarettes    Quit date: 12/11/1986    Years since quitting: 35.7   Smokeless tobacco: Never  Vaping Use   Vaping Use: Never used  Substance and Sexual Activity   Alcohol use: No   Drug use: Never   Sexual activity: Yes  Other Topics Concern   Not on file  Social History Narrative   Not on file   Social Determinants of Health   Financial Resource Strain: Low Risk  (08/12/2021)   Overall Financial Resource Strain (CARDIA)    Difficulty of Paying Living Expenses: Not hard at all  Food Insecurity: No Food Insecurity (08/12/2021)   Hunger Vital Sign    Worried About Running Out of Food in the Last Year: Never true    [place] in the Last Year: Never true  Transportation Needs: No Transportation Needs (08/12/2021)   PRAPARE - Hydrologist (Medical): No    Lack of Transportation (Non-Medical): No  Physical Activity: Inactive (10/25/2021)   Exercise Vital Sign    Days of Exercise per Week: 0 days    Minutes of Exercise per Session: 0 min  Stress: Stress Concern Present (10/25/2021)   [place]    Feeling of Stress : To some extent  Social Connections: Socially Integrated (08/12/2021)   Social Connection and Isolation Panel [NHANES]    Frequency of Communication with Friends and Family: Three times a week    Frequency of Social Gatherings with Friends and Family: Three times a week    Attends Religious Services: More than 4 times per year    Active Member of Clubs or Organizations: Yes    Attends Archivist Meetings: More than 4 times per year    Marital Status: Married  Human resources officer Violence: Not At Risk (08/12/2021)   Humiliation, Afraid, Rape, and Kick  questionnaire    Fear of Current or Ex-Partner: No    Emotionally Abused: No    Physically Abused: No    Sexually Abused: No    Family History  Problem Relation Age of Onset   Stroke Mother    Hypertension Mother    Alzheimer's disease Mother    Hypertension Father    Brain cancer Sister    Breast cancer Sister    Hypertension Sister    Deep vein thrombosis Brother    Hypertension Brother    Colon cancer Neg Hx    Pancreatic disease Neg Hx    Esophageal cancer Neg Hx    Rectal cancer Neg Hx    Stomach cancer Neg Hx        Objective: Vitals:   08/31/22 1449  BP: (!) 159/65  Pulse: 90     Physical Exam  Latest Reference Range & Units 01/26/22 14:23  Prostatic Specific Antigen 0.00 - 4.00 ng/mL 72.30 (H)  (H): Data is abnormally high  Lab Results  Component Value Date   PSA1 382.0 (H) 01/12/2022   PSA1 66.7 (H) 11/22/2021   PSA1 53.2 (H) 11/17/2021    Lab Results:  Results for orders placed or performed in visit on 08/31/22 (from the past 24 hour(s))  Comprehensive metabolic panel     Status: Abnormal   Collection Time: 08/31/22  1:00 PM  Result Value Ref Range   Sodium 138 135 - 145 mmol/L   Potassium 3.6 3.5 - 5.1 mmol/L   Chloride 100 98 - 111 mmol/L   CO2 25 22 - 32 mmol/L   Glucose, Bld 144 (H) 70 - 99 mg/dL   BUN 24 (H) 8 - 23 mg/dL   Creatinine, Ser 1.30 (H) 0.61 - 1.24 mg/dL   Calcium 9.4 8.9 - 10.3 mg/dL   Total Protein 6.5 6.5 - 8.1 g/dL   Albumin  3.5 3.5 - 5.0 g/dL   AST 22 15 - 41 U/L   ALT 11 0 - 44 U/L   Alkaline Phosphatase 43 38 - 126 U/L   Total Bilirubin 0.5 0.3 - 1.2 mg/dL   GFR, Estimated 56 (L) >60 mL/min   Anion gap 13 5 - 15      BMET Recent Labs    08/31/22 1300  NA 138  K 3.6  CL 100  CO2 25  GLUCOSE 144*  BUN 24*  CREATININE 1.30*  CALCIUM 9.4   PSA PSA  Date Value Ref Range Status  05/20/2020 6.4 (H) < OR = 4.0 ng/mL Final    Comment:    The total PSA value from this assay system is  standardized  against the WHO standard. The test  result will be approximately 20% lower when compared  to the equimolar-standardized total PSA (Beckman  Coulter). Comparison of serial PSA results should be  interpreted with this fact in mind. . This test was performed using the Siemens  chemiluminescent method. Values obtained from  different assay methods cannot be used interchangeably. PSA levels, regardless of value, should not be interpreted as absolute evidence of the presence or absence of disease.   11/12/2014 2.0 0.0 - 4.0 ng/mL Final    Comment:    Roche ECLIA methodology. According to the American Urological Association, Serum PSA should decrease and remain at undetectable levels after radical prostatectomy. The AUA defines biochemical recurrence as an initial PSA value 0.2 ng/mL or greater followed by a subsequent confirmatory PSA value 0.2 ng/mL or greater. Values obtained with different assay methods or kits cannot be used interchangeably. Results cannot be interpreted as absolute evidence of the presence or absence of malignant disease.    Testosterone  Date Value Ref Range Status  01/12/2022 625 264 - 916 ng/dL Final    Comment:    Adult male reference interval is based on a population of healthy nonobese males (BMI <30) between 36 and 80 years old. [place], [place] [phone]. PMID: [id number].     UA is clear.   Studies/Results:   CT results reviewed from 06/02/22.   Assessment & Plan: Prostate cancer metastatic to bone.   His PSA is rising on firmagon and apalutamide but is not having any worrisome symptoms and is tolerating both well with the exception of  rash with the apalutamide.  I will differ treatment change to Dr. Delton Coombes at this time.   He will continue monthly firmagon in this office and see me in 3 months.    Dr. Delton Coombes will be obtaining labs.   Nodular prostate with obstruction.  He is voiding ok on current therapy.    AKI.   His Cr is  up to 1.3 from 1.0.  He has no flank pain and has a clear urine.  I will reach out to Dr. Delton Coombes regarding the need for close f/u of the lab change and possible imaging.      Meds ordered this encounter  Medications   degarelix (FIRMAGON) injection 80 mg      Orders Placed This Encounter  Procedures   Urinalysis, Routine w reflex microscopic      Return in about 3 months (around 11/30/2022) for Continue the monthly firmagon.  .   CC: Dettinger, Fransisca Kaufmann, MD and Dr. Derek Jack.      Alan [name] 09/01/2022 Patient ID: Alan Netters., male   DOB: Feb 27, 1941, 81 y.o.   MRN: [id number] Patient ID: Alan Lek  Brooke Bonito., male   DOB: July 02, 1941, 81 y.o.   MRN: [id number]

## 2022-09-01 ENCOUNTER — Encounter (HOSPITAL_COMMUNITY): Payer: Self-pay | Admitting: Hematology

## 2022-09-01 LAB — URINALYSIS, ROUTINE W REFLEX MICROSCOPIC
Bilirubin, UA: NEGATIVE
Glucose, UA: NEGATIVE
Leukocytes,UA: NEGATIVE
Nitrite, UA: NEGATIVE
RBC, UA: NEGATIVE
Specific Gravity, UA: 1.015 (ref 1.005–1.030)
Urobilinogen, Ur: 0.2 mg/dL (ref 0.2–1.0)
pH, UA: 7 (ref 5.0–7.5)

## 2022-09-04 ENCOUNTER — Ambulatory Visit (INDEPENDENT_AMBULATORY_CARE_PROVIDER_SITE_OTHER): Payer: No Typology Code available for payment source | Admitting: Family Medicine

## 2022-09-04 ENCOUNTER — Encounter: Payer: Self-pay | Admitting: Family Medicine

## 2022-09-04 VITALS — BP 171/72 | HR 68 | Temp 97.0°F | Ht 65.0 in | Wt 142.0 lb

## 2022-09-04 DIAGNOSIS — Z7901 Long term (current) use of anticoagulants: Secondary | ICD-10-CM

## 2022-09-04 DIAGNOSIS — D6859 Other primary thrombophilia: Secondary | ICD-10-CM | POA: Diagnosis not present

## 2022-09-04 DIAGNOSIS — Z86718 Personal history of other venous thrombosis and embolism: Secondary | ICD-10-CM

## 2022-09-04 DIAGNOSIS — I480 Paroxysmal atrial fibrillation: Secondary | ICD-10-CM

## 2022-09-04 LAB — COAGUCHEK XS/INR WAIVED
INR: 2.2 — ABNORMAL HIGH (ref 0.9–1.1)
Prothrombin Time: 26.8 s

## 2022-09-04 NOTE — Progress Notes (Signed)
BP (!) 171/72   Pulse 68   Temp (!) 97 F (36.1 C)   Ht '5\' 5"'  (1.651 m)   Wt 142 lb (64.4 kg)   SpO2 100%   BMI 23.63 kg/m    Subjective:   Patient ID: Alan Netters., male    DOB: 1941-02-06, 81 y.o.   MRN: [id number]  HPI: Alan Marschall. is a 80 y.o. male presenting on 09/04/2022 for Medical Management of Chronic Issues and Atrial Fibrillation   HPI Coumadin recheck Target goal: 2.0-3.0 Reason on anticoagulation: A-fib Patient denies any bruising or bleeding or chest pain or palpitations   Relevant past medical, surgical, family and social history reviewed and updated as indicated. Interim medical history since our last visit reviewed. Allergies and medications reviewed and updated.  Review of Systems  Constitutional:  Negative for chills and fever.  Eyes:  Negative for visual disturbance.  Respiratory:  Negative for shortness of breath and wheezing.   Cardiovascular:  Negative for chest pain and leg swelling.  Musculoskeletal:  Negative for back pain and gait problem.  Skin:  Negative for rash.  Neurological:  Negative for dizziness, weakness and light-headedness.  All other systems reviewed and are negative.   Per HPI unless specifically indicated above   Allergies as of 09/04/2022       Reactions   Allopurinol Other (See Comments)   Significantly decreased WBC's   Colchicine Other (See Comments)   Significantly decreased WBC's        Medication List        Accurate as of September 04, 2022  9:52 AM. If you have any questions, ask your nurse or doctor.          acetaminophen 500 MG tablet Commonly known as: TYLENOL Take 1,000 mg by mouth every 6 (six) hours as needed for moderate pain.   amLODipine 10 MG tablet Commonly known as: NORVASC Take 1 tablet (10 mg total) by mouth every evening.   Blood Pressure Kit 1 each by Does not apply route daily.   CALCIUM 600 + D PO Take 1 tablet by mouth in the morning and at bedtime.   calcium  carbonate 1500 (600 Ca) MG Tabs tablet Commonly known as: OSCAL Take by mouth 2 (two) times daily with a meal.   chlorthalidone 25 MG tablet Commonly known as: HYGROTON Take 1 tablet by mouth once daily   diclofenac Sodium 1 % Gel Commonly known as: VOLTAREN Apply 1 application topically 2 (two) times daily as needed (pain).   Erleada 60 MG tablet Generic drug: apalutamide Take 4 tablets (240 mg total) by mouth daily. Take as directed. What changed: how much to take   ferrous sulfate 325 (65 FE) MG tablet Commonly known as: SV Iron Take 1 tablet (325 mg total) by mouth daily with breakfast.   fluticasone 50 MCG/ACT nasal spray Commonly known as: FLONASE Place 1 spray into both nostrils 2 (two) times daily as needed for allergies or rhinitis.   glucose blood test strip Commonly known as: ONE TOUCH ULTRA TEST Use to check BG once daily.  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 Can take every 4 hours if needed.   lisinopril 40 MG tablet Commonly known as: ZESTRIL Take 1 tablet (40 mg total) by mouth daily.   magnesium oxide 400 (240 Mg) MG tablet Commonly known  as: MAG-OX Take 1 tablet (400 mg total) by mouth daily.   metFORMIN 1000 MG tablet Commonly known as: GLUCOPHAGE Take 1 tablet (1,000 mg total) by mouth 2 (two) times daily with a meal.   metoprolol succinate 100 MG 24 hr tablet Commonly known as: TOPROL-XL Take 1 tablet (100 mg total) by mouth daily. Take with or immediately following a meal.   omeprazole 20 MG capsule Commonly known as: PRILOSEC Take 1 capsule (20 mg total) by mouth daily.   ONE TOUCH ULTRA MINI w/Device Kit 1 each by Does not apply route 2 (two) times daily.   potassium chloride SA 20 MEQ tablet Commonly known as: KLOR-CON M TAKE 1  BY MOUTH ONCE DAILY Strength: 20 mEq   pravastatin 40 MG  tablet Commonly known as: PRAVACHOL Take 1 tablet (40 mg total) by mouth every evening.   warfarin 10 MG tablet Commonly known as: Coumadin Take as directed by the anticoagulation clinic. If you are unsure how to take this medication, talk to your nurse or doctor. Original instructions: Take 1 tablet (10 mg total) by mouth daily.         Objective:   BP (!) 171/72   Pulse 68   Temp (!) 97 F (36.1 C)   Ht '5\' 5"'  (1.651 m)   Wt 142 lb (64.4 kg)   SpO2 100%   BMI 23.63 kg/m   Wt Readings from Last 3 Encounters:  09/04/22 142 lb (64.4 kg)  08/21/22 143 lb (64.9 kg)  08/03/22 144 lb 9.6 oz (65.6 kg)    Physical Exam Vitals and nursing note reviewed.  Constitutional:      General: He is not in acute distress.    Appearance: He is well-developed. He is not diaphoretic.  Eyes:     General: No scleral icterus.    Conjunctiva/sclera: Conjunctivae normal.  Neck:     Thyroid: No thyromegaly.  Cardiovascular:     Rate and Rhythm: Normal rate. Rhythm irregular.     Heart sounds: Normal heart sounds. No murmur heard. Pulmonary:     Effort: Pulmonary effort is normal. No respiratory distress.     Breath sounds: Normal breath sounds. No wheezing.  Skin:    General: Skin is warm and dry.     Findings: No rash.  Neurological:     Mental Status: He is alert and oriented to person, place, and time.     Coordination: Coordination normal.  Psychiatric:        Behavior: Behavior normal.       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   INR was 2.2 (goal 2-3)    Follow-up 4-6weeks      Follow up plan: Return if symptoms worsen or fail to improve, for 4 to 6-week INR recheck.  Counseling provided for all of the vaccine components Orders Placed This Encounter  Procedures   CoaguChek XS/INR Alan Joquan Lotz, MD [facility] 09/04/2022, 9:52 AM

## 2022-09-07 ENCOUNTER — Other Ambulatory Visit: Payer: Self-pay

## 2022-09-07 DIAGNOSIS — C7951 Secondary malignant neoplasm of bone: Secondary | ICD-10-CM

## 2022-09-07 DIAGNOSIS — C61 Malignant neoplasm of prostate: Secondary | ICD-10-CM

## 2022-09-07 NOTE — Progress Notes (Signed)
In basket received from Dr. Delton Coombes- Please schedule him for CT AP with contrast and bone scan, next available.  Please also repeat BMP at the same time.  Please schedule him for follow-up 1 to 2 days after scans.  Thanks.   Orders placed per Dr. Tomie China order and message sent to schedulers to have scheduled.

## 2022-09-20 ENCOUNTER — Encounter (HOSPITAL_COMMUNITY)
Admission: RE | Admit: 2022-09-20 | Discharge: 2022-09-20 | Disposition: A | Discharge status: Home / Self Care | Payer: No Typology Code available for payment source | Source: Ambulatory Visit | Attending: Hematology | Admitting: Hematology

## 2022-09-20 ENCOUNTER — Encounter (HOSPITAL_COMMUNITY): Payer: Self-pay

## 2022-09-20 DIAGNOSIS — M19011 Primary osteoarthritis, right shoulder: Secondary | ICD-10-CM | POA: Diagnosis not present

## 2022-09-20 DIAGNOSIS — C61 Malignant neoplasm of prostate: Secondary | ICD-10-CM | POA: Diagnosis not present

## 2022-09-20 DIAGNOSIS — K4090 Unilateral inguinal hernia, without obstruction or gangrene, not specified as recurrent: Secondary | ICD-10-CM | POA: Diagnosis not present

## 2022-09-20 DIAGNOSIS — C7951 Secondary malignant neoplasm of bone: Secondary | ICD-10-CM | POA: Diagnosis not present

## 2022-09-20 MED ORDER — TECHNETIUM TC 99M MEDRONATE IV KIT
20.0000 | PACK | Freq: Once | INTRAVENOUS | Status: AC | PRN
  Administered 2022-09-20: 19.7 via INTRAVENOUS

## 2022-09-28 ENCOUNTER — Ambulatory Visit (HOSPITAL_COMMUNITY)
Admission: RE | Admit: 2022-09-28 | Discharge: 2022-09-28 | Disposition: A | Discharge status: Home / Self Care | Payer: No Typology Code available for payment source | Source: Ambulatory Visit | Attending: Hematology | Admitting: Hematology

## 2022-09-28 DIAGNOSIS — C7951 Secondary malignant neoplasm of bone: Secondary | ICD-10-CM | POA: Insufficient documentation

## 2022-09-28 DIAGNOSIS — K859 Acute pancreatitis without necrosis or infection, unspecified: Secondary | ICD-10-CM | POA: Diagnosis not present

## 2022-09-28 DIAGNOSIS — K861 Other chronic pancreatitis: Secondary | ICD-10-CM | POA: Diagnosis not present

## 2022-09-28 DIAGNOSIS — K4090 Unilateral inguinal hernia, without obstruction or gangrene, not specified as recurrent: Secondary | ICD-10-CM | POA: Diagnosis not present

## 2022-09-28 DIAGNOSIS — C61 Malignant neoplasm of prostate: Secondary | ICD-10-CM | POA: Diagnosis not present

## 2022-09-28 DIAGNOSIS — K5730 Diverticulosis of large intestine without perforation or abscess without bleeding: Secondary | ICD-10-CM | POA: Diagnosis not present

## 2022-09-28 MED ORDER — IOHEXOL 300 MG/ML  SOLN
100.0000 mL | Freq: Once | INTRAMUSCULAR | Status: AC | PRN
  Administered 2022-09-28: 100 mL via INTRAVENOUS

## 2022-10-03 ENCOUNTER — Telehealth: Payer: Self-pay

## 2022-10-03 ENCOUNTER — Other Ambulatory Visit (HOSPITAL_COMMUNITY): Payer: Self-pay

## 2022-10-03 ENCOUNTER — Inpatient Hospital Stay: Payer: No Typology Code available for payment source

## 2022-10-03 ENCOUNTER — Telehealth: Payer: Self-pay | Admitting: Pharmacist

## 2022-10-03 ENCOUNTER — Inpatient Hospital Stay: Payer: No Typology Code available for payment source | Attending: Hematology | Admitting: Hematology

## 2022-10-03 VITALS — BP 148/70 | HR 71 | Temp 97.0°F | Resp 18

## 2022-10-03 VITALS — BP 156/78 | HR 83 | Temp 98.2°F | Resp 18 | Ht 65.0 in | Wt 146.1 lb

## 2022-10-03 DIAGNOSIS — C7951 Secondary malignant neoplasm of bone: Secondary | ICD-10-CM

## 2022-10-03 DIAGNOSIS — I10 Essential (primary) hypertension: Secondary | ICD-10-CM | POA: Insufficient documentation

## 2022-10-03 DIAGNOSIS — R0602 Shortness of breath: Secondary | ICD-10-CM | POA: Diagnosis not present

## 2022-10-03 DIAGNOSIS — C61 Malignant neoplasm of prostate: Secondary | ICD-10-CM | POA: Insufficient documentation

## 2022-10-03 DIAGNOSIS — D649 Anemia, unspecified: Secondary | ICD-10-CM | POA: Insufficient documentation

## 2022-10-03 DIAGNOSIS — Z803 Family history of malignant neoplasm of breast: Secondary | ICD-10-CM | POA: Diagnosis not present

## 2022-10-03 DIAGNOSIS — Z79899 Other long term (current) drug therapy: Secondary | ICD-10-CM | POA: Insufficient documentation

## 2022-10-03 DIAGNOSIS — Z823 Family history of stroke: Secondary | ICD-10-CM | POA: Insufficient documentation

## 2022-10-03 DIAGNOSIS — I4891 Unspecified atrial fibrillation: Secondary | ICD-10-CM | POA: Insufficient documentation

## 2022-10-03 DIAGNOSIS — Z818 Family history of other mental and behavioral disorders: Secondary | ICD-10-CM | POA: Diagnosis not present

## 2022-10-03 DIAGNOSIS — R110 Nausea: Secondary | ICD-10-CM | POA: Insufficient documentation

## 2022-10-03 DIAGNOSIS — Z87891 Personal history of nicotine dependence: Secondary | ICD-10-CM | POA: Diagnosis not present

## 2022-10-03 DIAGNOSIS — R197 Diarrhea, unspecified: Secondary | ICD-10-CM | POA: Insufficient documentation

## 2022-10-03 DIAGNOSIS — Z8249 Family history of ischemic heart disease and other diseases of the circulatory system: Secondary | ICD-10-CM | POA: Insufficient documentation

## 2022-10-03 DIAGNOSIS — R200 Anesthesia of skin: Secondary | ICD-10-CM | POA: Diagnosis not present

## 2022-10-03 DIAGNOSIS — Z808 Family history of malignant neoplasm of other organs or systems: Secondary | ICD-10-CM | POA: Diagnosis not present

## 2022-10-03 LAB — CBC WITH DIFFERENTIAL/PLATELET
Abs Immature Granulocytes: 0.02 10*3/uL (ref 0.00–0.07)
Basophils Absolute: 0 10*3/uL (ref 0.0–0.1)
Basophils Relative: 0 %
Eosinophils Absolute: 0.1 10*3/uL (ref 0.0–0.5)
Eosinophils Relative: 3 %
HCT: 20.9 % — ABNORMAL LOW (ref 39.0–52.0)
Hemoglobin: 6.4 g/dL — CL (ref 13.0–17.0)
Immature Granulocytes: 1 %
Lymphocytes Relative: 16 %
Lymphs Abs: 0.7 10*3/uL (ref 0.7–4.0)
MCH: 28.7 pg (ref 26.0–34.0)
MCHC: 30.6 g/dL (ref 30.0–36.0)
MCV: 93.7 fL (ref 80.0–100.0)
Monocytes Absolute: 0.4 10*3/uL (ref 0.1–1.0)
Monocytes Relative: 9 %
Neutro Abs: 2.8 10*3/uL (ref 1.7–7.7)
Neutrophils Relative %: 71 %
Platelets: 255 10*3/uL (ref 150–400)
RBC: 2.23 MIL/uL — ABNORMAL LOW (ref 4.22–5.81)
RDW: 14.9 % (ref 11.5–15.5)
WBC: 4 10*3/uL (ref 4.0–10.5)
nRBC: 0 % (ref 0.0–0.2)

## 2022-10-03 LAB — COMPREHENSIVE METABOLIC PANEL
ALT: 11 U/L (ref 0–44)
AST: 19 U/L (ref 15–41)
Albumin: 3.3 g/dL — ABNORMAL LOW (ref 3.5–5.0)
Alkaline Phosphatase: 53 U/L (ref 38–126)
Anion gap: 9 (ref 5–15)
BUN: 18 mg/dL (ref 8–23)
CO2: 24 mmol/L (ref 22–32)
Calcium: 9.1 mg/dL (ref 8.9–10.3)
Chloride: 104 mmol/L (ref 98–111)
Creatinine, Ser: 1.2 mg/dL (ref 0.61–1.24)
GFR, Estimated: >60 mL/min (ref >60)
Glucose, Bld: 186 mg/dL — ABNORMAL HIGH (ref 70–99)
Potassium: 4.6 mmol/L (ref 3.5–5.1)
Sodium: 137 mmol/L (ref 135–145)
Total Bilirubin: 0.2 mg/dL — ABNORMAL LOW (ref 0.3–1.2)
Total Protein: 6.6 g/dL (ref 6.5–8.1)

## 2022-10-03 LAB — FOLATE: Folate: 11 ng/mL (ref >5.9)

## 2022-10-03 LAB — ABO/RH: ABO/RH(D): B POS

## 2022-10-03 LAB — PREPARE RBC (CROSSMATCH)

## 2022-10-03 LAB — VITAMIN B12: Vitamin B-12: 185 pg/mL (ref 180–914)

## 2022-10-03 LAB — IRON AND TIBC
Iron: 16 ug/dL — ABNORMAL LOW (ref 45–182)
Saturation Ratios: 4 % — ABNORMAL LOW (ref 17.9–39.5)
TIBC: 374 ug/dL (ref 250–450)
UIBC: 358 ug/dL

## 2022-10-03 LAB — FERRITIN: Ferritin: 8 ng/mL — ABNORMAL LOW (ref 24–336)

## 2022-10-03 LAB — PSA: Prostatic Specific Antigen: 53.32 ng/mL — ABNORMAL HIGH (ref 0.00–4.00)

## 2022-10-03 LAB — MAGNESIUM: Magnesium: 1.9 mg/dL (ref 1.7–2.4)

## 2022-10-03 MED ORDER — PREDNISONE 5 MG PO TABS: 5.0000 mg | ORAL_TABLET | Freq: Every day | ORAL | 2 refills | Status: DC

## 2022-10-03 MED ORDER — DENOSUMAB 120 MG/1.7ML ~~LOC~~ SOLN
120.0000 mg | Freq: Once | SUBCUTANEOUS | Status: AC
  Administered 2022-10-03: 120 mg via SUBCUTANEOUS
  Filled 2022-10-03: qty 1.7

## 2022-10-03 MED ORDER — DIPHENHYDRAMINE HCL 25 MG PO CAPS
25.0000 mg | ORAL_CAPSULE | Freq: Once | ORAL | Status: AC
  Administered 2022-10-03: 25 mg via ORAL
  Filled 2022-10-03: qty 1

## 2022-10-03 MED ORDER — ACETAMINOPHEN 325 MG PO TABS
650.0000 mg | ORAL_TABLET | Freq: Once | ORAL | Status: AC
  Administered 2022-10-03: 650 mg via ORAL
  Filled 2022-10-03: qty 2

## 2022-10-03 MED ORDER — SODIUM CHLORIDE 0.9% IV SOLUTION
250.0000 mL | Freq: Once | INTRAVENOUS | Status: AC
  Administered 2022-10-03: 250 mL via INTRAVENOUS

## 2022-10-03 MED ORDER — ABIRATERONE ACETATE 250 MG PO TABS
1000.0000 mg | ORAL_TABLET | Freq: Every day | ORAL | 3 refills | Status: DC
  Filled 2022-10-03 – 2022-10-04 (×2): qty 120, 30d supply, fill #0
  Filled 2022-10-31: qty 120, 30d supply, fill #1
  Filled 2022-11-22: qty 120, 30d supply, fill #2
  Filled 2022-12-19: qty 120, 30d supply, fill #3

## 2022-10-03 NOTE — Telephone Encounter (Addendum)
Oral Oncology Patient Advocate Encounter  Prior Authorization for Abiraterone has been approved.    PA# [id number]  Effective dates: 07.26.23 through 10.23.26  Patients co-pay is $2415.31.  Patient okay paying $140 using [employer]. Pharmacy is aware.    Berdine Addison, [facility] Patient [place]  [phone] (phone) [phone] (fax) 10/03/2022 3:04 PM

## 2022-10-03 NOTE — Progress Notes (Signed)
Patient taking calcium as directed. Denied tooth, jaw, and leg pain. No recent or upcoming dental visits. Labs reviewed. Patient tolerated injection with no complaints voiced. See MAR for details. Patient stable during and after injection. Site clean and dry with no bruising or swelling noted. Band aid applied.

## 2022-10-03 NOTE — Progress Notes (Signed)
CRITICAL VALUE STICKER  CRITICAL VALUE: Hemoglobin  RECEIVER (on-site recipient of call): Ronnald Nian, RN  [place] NOTIFIED: 10/03/22 1257  MESSENGER (representative from lab): Kirsten  MD NOTIFIED: Dr. Delton Coombes  TIME OF NOTIFICATION: 1300  RESPONSE:  Transfuse 1 unit of PRBCs

## 2022-10-03 NOTE — Patient Instructions (Addendum)
[name]  Discharge Instructions  You were seen and examined today by Dr. Delton Coombes.  Dr. 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 Please stop [place] - it is not working.   Dr. Delton Coombes has prescribed Zytiga. Please take this daily with prednisone.  You will get your Xgeva today.  You will need blood, your hemoglobin is 6.4 today.  Follow-up as scheduled.  Thank you for choosing [facility] to provide your oncology and hematology care.   To afford each patient quality time with our provider, please arrive at least 15 minutes before your scheduled appointment time. You may need to reschedule your appointment if you arrive late (10 or more minutes). Arriving late affects you and other patients whose appointments are after yours.  Also, if you miss three or more appointments without notifying the office, you may be dismissed from the clinic at the provider's discretion.    Again, thank you for choosing [facility].  Our hope is that these requests will decrease the amount of time that you wait before being seen by our physicians.   If you have a lab appointment with the [facility] please come in thru the Main Entrance and check in at the main information desk.           _____________________________________________________________  Should you have questions after your visit to [facility], please contact our office at [phone] and follow the prompts.  Our office hours are 8:00 a.m. to 4:30 p.m. Monday - Thursday and 8:00 a.m. to 2:30 p.m. Friday.  Please note that voicemails left after 4:00 p.m. may not be returned until the following business day.  We are closed weekends and all major holidays.  You do have access to a nurse 24-7, just call the main number to the clinic [phone] and do not press any options, hold on the line and a nurse will  answer the phone.    For prescription refill requests, have your pharmacy contact our office and allow 72 hours.    Masks are optional in the cancer centers. If you would like for your care team to wear a mask while they are taking care of you, please let them know. You may have one support person who is at least 79 years old accompany you for your appointments.

## 2022-10-03 NOTE — Telephone Encounter (Signed)
Oral Oncology Patient Advocate Encounter   Received notification that prior authorization for Abiraterone is required.   PA submitted on 10.24.23  Key [id number]  Status is pending     Berdine Addison, [facility] [place]  [phone] (phone) [phone] (fax) 10/03/2022 2:18 PM

## 2022-10-03 NOTE — Patient Instructions (Signed)
[facility]  Discharge Instructions: Thank you for choosing [facility] to provide your oncology and hematology care.  If you have a lab appointment with the [facility], please come in thru the Main Entrance and check in at the main information desk.  Wear comfortable clothing and clothing appropriate for easy access to any Portacath or PICC line.   We strive to give you quality time with your provider. You may need to reschedule your appointment if you arrive late (15 or more minutes).  Arriving late affects you and other patients whose appointments are after yours.  Also, if you miss three or more appointments without notifying the office, you may be dismissed from the clinic at the provider's discretion.      For prescription refill requests, have your pharmacy contact our office and allow 72 hours for refills to be completed.    Today you received the following chemotherapy and/or immunotherapy agents Xgeva and 1 unit of blood .  Blood Transfusion, Adult, Care After The following information offers guidance on how to care for yourself after your procedure. Your health care provider may also give you more specific instructions. If you have problems or questions, contact your health care provider. What can I expect after the procedure? After the procedure, it is common to have: Bruising and soreness where the IV was inserted. A headache. Follow these instructions at home: IV insertion site care     Follow instructions from your health care provider about how to take care of your IV insertion site. Make sure you: Wash your hands with soap and water for at least 20 seconds before and after you change your bandage (dressing). If soap and water are not available, use hand sanitizer. Change your dressing as told by your health care provider. Check your IV insertion site every day for signs of infection. Check for: Redness, swelling, or pain. Bleeding  from the site. Warmth. Pus or a bad smell. General instructions Take over-the-counter and prescription medicines only as told by your health care provider. Rest as told by your health care provider. Return to your normal activities as told by your health care provider. Keep all follow-up visits. Lab tests may need to be done at certain periods to recheck your blood counts. Contact a health care provider if: You have itching or red, swollen areas of skin (hives). You have a fever or chills. You have pain in the head, back, or chest. You feel anxious or you feel weak after doing your normal activities. You have redness, swelling, warmth, or pain around the IV insertion site. You have blood coming from the IV insertion site that does not stop with pressure. You have pus or a bad smell coming from your IV insertion site. If you received your blood transfusion in an outpatient setting, you will be told whom to contact to report any reactions. Get help right away if: You have symptoms of a serious allergic or immune system reaction, including: Trouble breathing or shortness of breath. Swelling of the face, feeling flushed, or widespread rash. Dark urine or blood in the urine. Fast heartbeat. These symptoms may be an emergency. Get help right away. Call 911. Do not wait to see if the symptoms will go away. Do not drive yourself to the hospital. Summary Bruising and soreness around the IV insertion site are common. Check your IV insertion site every day for signs of infection. Rest as told by your health care provider. Return  to your normal activities as told by your health care provider. Get help right away for symptoms of a serious allergic or immune system reaction to the blood transfusion. This information is not intended to replace advice given to you by your health care provider. Make sure you discuss any questions you have with your health care provider. Document Revised: 02/24/2022  Document Reviewed: 02/24/2022 Elsevier Patient Education  [place]. Denosumab Injection (Oncology) What is this medication? DENOSUMAB (den oh SUE mab) prevents weakened bones caused by cancer. It may also be used to treat noncancerous bone tumors that cannot be removed by surgery. It can also be used to treat high calcium levels in the blood caused by cancer. It works by blocking a protein that causes bones to break down quickly. This slows down the release of calcium from bones, which lowers calcium levels in your blood. It also makes your bones stronger and less likely to break (fracture). This medicine may be used for other purposes; ask your health care provider or pharmacist if you have questions. COMMON BRAND NAME(S): XGEVA What should I tell my care team before I take this medication? They need to know if you have any of these conditions: Dental disease Having surgery or tooth extraction Infection Kidney disease Low levels of calcium or vitamin D in the blood Malnutrition On hemodialysis Skin conditions or sensitivity Thyroid or parathyroid disease An unusual reaction to denosumab, other medications, foods, dyes, or preservatives Pregnant or trying to get pregnant Breast-feeding How should I use this medication? This medication is for injection under the skin. It is given by your care team in a hospital or clinic setting. A special MedGuide will be given to you before each treatment. Be sure to read this information carefully each time. Talk to your care team about the use of this medication in children. While it may be prescribed for children as young as 13 years for selected conditions, precautions do apply. Overdosage: If you think you have taken too much of this medicine contact a poison control center or emergency room at once. NOTE: This medicine is only for you. Do not share this medicine with others. What if I miss a dose? Keep appointments for follow-up doses. It  is important not to miss your dose. Call your care team if you are unable to keep an appointment. What may interact with this medication? Do not take this medication with any of the following: Other medications containing denosumab This medication may also interact with the following: Medications that lower your chance of fighting infection Steroid medications, such as prednisone or cortisone This list may not describe all possible interactions. Give your health care provider a list of all the medicines, herbs, non-prescription drugs, or dietary supplements you use. Also tell them if you smoke, drink alcohol, or use illegal drugs. Some items may interact with your medicine. What should I watch for while using this medication? Your condition will be monitored carefully while you are receiving this medication. You may need blood work while taking this medication. This medication may increase your risk of getting an infection. Call your care team for advice if you get a fever, chills, sore throat, or other symptoms of a cold or flu. Do not treat yourself. Try to avoid being around people who are sick. You should make sure you get enough calcium and vitamin D while you are taking this medication, unless your care team tells you not to. Discuss the foods you eat and the  vitamins you take with your care team. Some people who take this medication have severe bone, joint, or muscle pain. This medication may also increase your risk for jaw problems or a broken thigh bone. Tell your care team right away if you have severe pain in your jaw, bones, joints, or muscles. Tell your care team if you have any pain that does not go away or that gets worse. Talk to your care team if you may be pregnant. Serious birth defects can occur if you take this medication during pregnancy and for 5 months after the last dose. You will need a negative pregnancy test before starting this medication. Contraception is recommended while  taking this medication and for 5 months after the last dose. Your care team can help you find the option that works for you. What side effects may I notice from receiving this medication? Side effects that you should report to your care team as soon as possible: Allergic reactions--skin rash, itching, hives, swelling of the face, lips, tongue, or throat Bone, joint, or muscle pain Low calcium level--muscle pain or cramps, confusion, tingling, or numbness in the hands or feet Osteonecrosis of the jaw--pain, swelling, or redness in the mouth, numbness of the jaw, poor healing after dental work, unusual discharge from the mouth, visible bones in the mouth Side effects that usually do not require medical attention (report to your care team if they continue or are bothersome): Cough Diarrhea Fatigue Headache Nausea This list may not describe all possible side effects. Call your doctor for medical advice about side effects. You may report side effects to FDA at 1-800-FDA-1088. Where should I keep my medication? This medication is given in a hospital or clinic. It will not be stored at home. NOTE: This sheet is a summary. It may not cover all possible information. If you have questions about this medicine, talk to your doctor, pharmacist, or health care provider.  2023 Elsevier/Gold Standard (2022-04-17 00:00:00)       To help prevent nausea and vomiting after your treatment, we encourage you to take your nausea medication as directed.  BELOW ARE SYMPTOMS THAT SHOULD BE REPORTED IMMEDIATELY: *FEVER GREATER THAN 100.4 F (38 C) OR HIGHER *CHILLS OR SWEATING *NAUSEA AND VOMITING THAT IS NOT CONTROLLED WITH YOUR NAUSEA MEDICATION *UNUSUAL SHORTNESS OF BREATH *UNUSUAL BRUISING OR BLEEDING *URINARY PROBLEMS (pain or burning when urinating, or frequent urination) *BOWEL PROBLEMS (unusual diarrhea, constipation, pain near the anus) TENDERNESS IN MOUTH AND THROAT WITH OR WITHOUT PRESENCE OF ULCERS  (sore throat, sores in mouth, or a toothache) UNUSUAL RASH, SWELLING OR PAIN  UNUSUAL VAGINAL DISCHARGE OR ITCHING   Items with * indicate a potential emergency and should be followed up as soon as possible or go to the Emergency Department if any problems should occur.  Please show the CHEMOTHERAPY ALERT CARD or IMMUNOTHERAPY ALERT CARD at check-in to the Emergency Department and triage nurse.  Should you have questions after your visit or need to cancel or reschedule your appointment, please contact [facility] [phone]  and follow the prompts.  Office hours are 8:00 a.m. to 4:30 p.m. Monday - Friday. Please note that voicemails left after 4:00 p.m. may not be returned until the following business day.  We are closed weekends and major holidays. You have access to a nurse at all times for urgent questions. Please call the main number to the clinic [phone] and follow the prompts.  For any non-urgent questions, you may also contact  your provider using MyChart. We now offer e-Visits for anyone 48 and older to request care online for non-urgent symptoms. For details visit [email or web address].   Also download the MyChart app! Go to the app store, search "MyChart", open the app, select [facility], and log in with your MyChart username and password.  Masks are optional in the cancer centers. If you would like for your care team to wear a mask while they are taking care of you, please let them know. You may have one support person who is at least 79 years old accompany you for your appointments.

## 2022-10-03 NOTE — Progress Notes (Signed)
1 Unit of blood given and Xgeva given today per MD orders. Tolerated infusion without adverse affects. Vital signs stable. No complaints at this time. Discharged from clinic ambulatory in stable condition. Alert and oriented x 3. F/U with [facility] as scheduled.  No complaints at this time. Discharged from clinic ambulatory in stable condition. Alert and oriented x 3. F/U with [name] as scheduled.

## 2022-10-03 NOTE — Telephone Encounter (Signed)
Oral Oncology Pharmacist Encounter  Received new prescription for Zytiga (abiraterone) for the treatment of progressive metastatic castration resistant prostate cancer in conjunction with prednisone and ADT, planned duration until disease progression or unacceptable drug toxicity.  CMP from 10/03/22 assessed, no relevant lab abnormalities. Prescription dose and frequency assessed.   Current medication list in Epic reviewed, a few DDIs with abiraterone identified: Metoprolol: abiraterone may increase the serum concentration of metoprolol. Monitor closely for evidence of excessive response to metoprolol (including, but not necessarily limited to, parameters such as heart rate, PR interval, blood pressure). No baseline dose adjustment needed. Pravastatin: Abiraterone Acetate may enhance the myopathic (rhabdomyolysis) effect of statins. Monitor for evidence of muscle toxicities (eg, myopathy, rhabdomyolysis). No baseline dose adjustment needed.  Evaluated chart and no patient barriers to medication adherence identified.   Prescription has been e-scribed to the [facility] for benefits analysis and approval.  Oral Oncology Clinic will continue to follow for insurance authorization, copayment issues, initial counseling and start date.   Darl Pikes, PharmD, BCPS, BCOP, CPP Hematology/Oncology Clinical Pharmacist Practitioner [place]/DB/AP [facility] [phone]  10/03/2022 1:53 PM

## 2022-10-03 NOTE — Progress Notes (Signed)
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  PRIOR THERAPY: Erleada from 02/08/2022 through 10/03/2022  NGS Results: not done  CURRENT THERAPY: [place] and Zytiga  BRIEF ONCOLOGIC HISTORY:  Oncology History  Prostate cancer ([place])  04/13/2014 Initial Diagnosis   Prostate cancer ([place])   06/25/2020 Cancer Staging   Staging form: Prostate, AJCC 8th Edition - Clinical stage from 06/25/2020: Stage IIC (cT2b, cN0, cM0, PSA: 12.8, Grade Group: 3) - Signed by Freeman Caldron, PA-C on 09/29/2020     CANCER STAGING:  Cancer Staging  Prostate cancer [facility]) Staging form: Prostate, AJCC 8th Edition - Clinical stage from 06/25/2020: Stage IIC (cT2b, cN0, cM0, PSA: 12.8, Grade Group: 3) - Signed by Freeman Caldron, PA-C on 09/29/2020 - Pathologic stage from 01/26/2022: Stage IVB (pT2, pN0, pM1b, Grade Group: 3) - Unsigned   INTERVAL HISTORY:  [name], a 81 y.o. 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  He is taking Erleada 3 tablets daily without any problems.  Energy levels are 50%.  Occasional diarrhea and nausea is also stable.  Tingling in the hands and feet is also stable.  REVIEW OF SYSTEMS:  Review of Systems  Respiratory:  Positive for shortness of breath.   Gastrointestinal:  Positive for diarrhea and nausea.  Genitourinary:  Negative for hematuria.   Neurological:  Positive for numbness (L leg).  Hematological:  Does not bruise/bleed easily.  Psychiatric/Behavioral:  Negative for sleep disturbance.   All other systems reviewed and are negative.   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   found during hospital stay with pancreatitis,  started coumadin;   last Abd CT in epic 07-29-2020  chronic thrombus portal venous   GERD (gastroesophageal reflux disease)    Heart murmur    History of acute pancreatitis    2016   necrotizing pancreatitis   History of adenomatous polyp of colon    History of DVT of lower extremity    History of GI bleed 08/2019   upper gi bleed due to duodenal ulcer   History of gout yrs ago   Hypertension    followed by pcp   Hypertrophic cardiomyopathy ([place])    followed by cardiology   Internal hemorrhoids    Mixed hyperlipidemia    Normocytic anemia    PAF (paroxysmal atrial fibrillation) ([place]) 04/2016   cardiologist-- dr h. Tamala Julian--  event monitor 07-04-2017 epic, NSR/ PAflutter with occasional RVR/  PACs/ PVCs ;  echo 05-16-2017 epic,  moderate LVH with severe asymptomic septal hypertrophy, G1DD, EF 60-65%,  mild AR, mild LAE   Pancreatic pseudocyst    Peyronie's disease    Prostate cancer [facility]) urologist-- dr wrenn/ oncologist-- dr Tammi Klippel   first dx 08/ 2009 Gleason 3+3 active survelliance;  until bx 07/ 2021  Stage T2b, Gleason 4+3   Right inguinal hernia    Type 2 diabetes mellitus ([place])    followed by pcp---  (11-03-2020 per pt  currently not checking blood sugar due to glucose monitor broken)   Wears glasses    Wears partial dentures    upper and lower   Past Surgical History:  Procedure Laterality Date   BIOPSY  02/09/2022   Procedure: BIOPSY;  Surgeon: Milus Banister, MD;  Location: [facility];  Service: Endoscopy;;   CHOLECYSTECTOMY N/A 11/26/2015   Procedure: LAPAROSCOPIC CHOLECYSTECTOMY;  Surgeon: Aviva Signs, MD;  Location: [facility];  Service: General;  Laterality: N/A;   COLONOSCOPY  last one 09-06-2020  dr stark   CYSTOSCOPY N/A 11/09/2020   Procedure: Erlene Quan;  Surgeon: Irine Seal, MD;   Location: [facility];  Service: Urology;  Laterality: N/A;   ESOPHAGOGASTRODUODENOSCOPY  10-06-2019  _0    ESOPHAGOGASTRODUODENOSCOPY N/A 02/09/2022   Procedure: ESOPHAGOGASTRODUODENOSCOPY (EGD);  Surgeon: Milus Banister, MD;  Location: [facility];  Service: Endoscopy;  Laterality: N/A;   EUS N/A 02/09/2022   Procedure: UPPER ENDOSCOPIC ULTRASOUND (EUS) RADIAL;  Surgeon: Milus Banister, MD;  Location: [facility];  Service: Endoscopy;  Laterality: N/A;   FLEXIBLE BRONCHOSCOPY Bilateral 04/14/2016   Procedure: FLEXIBLE BRONCHOSCOPY;  Surgeon: Sinda Du, MD;  [facility];  Service: Cardiopulmonary;  Laterality: Bilateral;   HEMORRHOID SURGERY  yrs ago   RADIOACTIVE SEED IMPLANT N/A 11/09/2020   Procedure: RADIOACTIVE SEED IMPLANT/BRACHYTHERAPY IMPLANT;  Surgeon: Irine Seal, MD;  Location[facility];  Service: Urology;  Laterality: N/A;   SPACE OAR INSTILLATION N/A 11/09/2020   Procedure: SPACE OAR INSTILLATION;  Surgeon: Irine Seal, MD;  Location: [facility];  Service: Urology;  Laterality: N/A;   UMBILICAL HERNIA REPAIR N/A 11/26/2015   Procedure: UMBILICAL HERNIORRHAPHY;  Surgeon: Aviva Signs, MD;  Location: [facility];  Service: General;  Laterality: N/A;    SOCIAL HISTORY:  Social History   Socioeconomic History   Marital status: Married    Spouse name: [name]   Number of children: 4   Years of education: Not on file   Highest education level: 10th grade  Occupational History   Occupation: Retired     Fish farm manager: [employer]  Tobacco Use   Smoking status: Former    Years: 5.00    Types: Cigarettes    Quit date: 12/11/1986    Years since quitting: 35.8   Smokeless tobacco: Never  Vaping Use   Vaping Use: Never used  Substance and Sexual Activity   Alcohol use: No   Drug use: Never   Sexual activity: Yes  Other Topics Concern   Not on file  Social History Narrative   Not on file   Social Determinants of Health    Financial Resource Strain: Low Risk  (08/12/2021)   Overall Financial Resource Strain (CARDIA)    Difficulty of Paying Living Expenses: Not hard at all  Food Insecurity: No Food Insecurity (08/12/2021)   Hunger Vital Sign    Worried About Running Out of Food in the Last Year: Never true    [place] in the Last Year: Never true  Transportation Needs: No Transportation Needs (08/12/2021)   PRAPARE - Hydrologist (Medical): No    Lack of Transportation (Non-Medical): No  Physical Activity: Inactive (10/25/2021)   Exercise Vital Sign    Days of Exercise per Week: 0 days    Minutes of Exercise per Session: 0 min  Stress: Stress Concern Present (10/25/2021)   [place]    Feeling of  Stress : To some extent  Social Connections: Socially Integrated (08/12/2021)   Social Connection and Isolation Panel [NHANES]    Frequency of Communication with Friends and Family: Three times a week    Frequency of Social Gatherings with Friends and Family: Three times a week    Attends Religious Services: More than 4 times per year    Active Member of Clubs or Organizations: Yes    Attends Archivist Meetings: More than 4 times per year    Marital Status: Married  Human resources officer Violence: Not At Risk (08/12/2021)   Humiliation, Afraid, Rape, and Kick questionnaire    Fear of Current or Ex-Partner: No    Emotionally Abused: No    Physically Abused: No    Sexually Abused: No    FAMILY HISTORY:  Family History  Problem Relation Age of Onset   Stroke Mother    Hypertension Mother    Alzheimer's disease Mother    Hypertension Father    Brain cancer Sister    Breast cancer Sister    Hypertension Sister    Deep vein thrombosis Brother    Hypertension Brother    Colon cancer Neg Hx    Pancreatic disease Neg Hx    Esophageal cancer Neg Hx    Rectal cancer Neg Hx    Stomach cancer Neg Hx      CURRENT MEDICATIONS:  Current Outpatient Medications  Medication Sig Dispense Refill   acetaminophen (TYLENOL) 500 MG tablet Take 1,000 mg by mouth every 6 (six) hours as needed for moderate pain.     amLODipine (NORVASC) 10 MG tablet Take 1 tablet (10 mg total) by mouth every evening. 90 tablet 3   apalutamide (ERLEADA) 60 MG tablet Take 4 tablets (240 mg total) by mouth daily. Take as directed. (Patient taking differently: Take 180 mg by mouth daily. Take as directed.) 120 tablet 0   Blood Glucose Monitoring Suppl (ONE TOUCH ULTRA MINI) w/Device KIT 1 each by Does not apply route 2 (two) times daily. 1 kit 1   Blood Pressure KIT 1 each by Does not apply route daily. 1 kit 0   Calcium Carb-Cholecalciferol (CALCIUM 600 + D PO) Take 1 tablet by mouth in the morning and at bedtime.     calcium carbonate (OSCAL) 1500 (600 Ca) MG TABS tablet Take by mouth 2 (two) times daily with a meal.     chlorthalidone (HYGROTON) 25 MG tablet Take 1 tablet by mouth once daily 90 tablet 3   diclofenac Sodium (VOLTAREN) 1 % GEL Apply 1 application topically 2 (two) times daily as needed (pain).     ferrous sulfate (SV IRON) 325 (65 FE) MG tablet Take 1 tablet (325 mg total) by mouth daily with breakfast. 90 tablet 1   fluticasone (FLONASE) 50 MCG/ACT nasal spray Place 1 spray into both nostrils 2 (two) times daily as needed for allergies or rhinitis. 16 g 6   glucose blood (ONE TOUCH ULTRA TEST) test strip Use to check BG once daily.  Dx:  Type 2 DM controlled E11.9 100 each 4   HYDROcodone-acetaminophen (NORCO/VICODIN) 5-325 MG tablet Take 1 tablet by mouth every 6 (six) hours as needed for moderate pain. Can take every 4 hours if needed. 30 tablet 0   lisinopril (ZESTRIL) 40 MG tablet Take 1 tablet (40 mg total) by mouth daily. 90 tablet 1   magnesium oxide (MAG-OX) 400 (240 Mg) MG tablet Take 1 tablet (400 mg total) by mouth daily. 90 tablet 6  metFORMIN (GLUCOPHAGE) 1000 MG tablet Take 1 tablet (1,000 mg  total) by mouth 2 (two) times daily with a meal. 180 tablet 3   metoprolol succinate (TOPROL-XL) 100 MG 24 hr tablet Take 1 tablet (100 mg total) by mouth daily. Take with or immediately following a meal. 90 tablet 3   omeprazole (PRILOSEC) 20 MG capsule Take 1 capsule (20 mg total) by mouth daily. 90 capsule 3   potassium chloride SA (KLOR-CON M) 20 MEQ tablet TAKE 1  BY MOUTH ONCE DAILY Strength: 20 mEq 90 tablet 1   pravastatin (PRAVACHOL) 40 MG tablet Take 1 tablet (40 mg total) by mouth every evening. 90 tablet 3   warfarin (COUMADIN) 10 MG tablet Take 1 tablet (10 mg total) by mouth daily. 90 tablet 3   No current facility-administered medications for this visit.    ALLERGIES:  Allergies  Allergen Reactions   Allopurinol Other (See Comments)    Significantly decreased WBC's   Colchicine Other (See Comments)    Significantly decreased WBC's    PHYSICAL EXAM:  Performance status (ECOG): 1 - Symptomatic but completely ambulatory  Vitals:   10/03/22 1302  BP: (!) 156/78  Pulse: 83  Resp: 18  Temp: 98.2 F (36.8 C)  SpO2: 100%   Wt Readings from Last 3 Encounters:  10/03/22 146 lb 1.6 oz (66.3 kg)  09/04/22 142 lb (64.4 kg)  08/21/22 143 lb (64.9 kg)   Physical Exam Vitals reviewed.  Constitutional:      Appearance: Normal appearance.     Comments: In wheelchair  Cardiovascular:     Rate and Rhythm: Normal rate and regular rhythm.     Pulses: Normal pulses.     Heart sounds: Normal heart sounds.  Pulmonary:     Effort: Pulmonary effort is normal.     Breath sounds: Normal breath sounds.  Skin:    Findings: Erythema (lesions on thighs bilaterally), lesion (Hyperpigmented prodominently on thighs) and rash (Hyperpigmented lesions prodominently on thighs) present. Rash is macular and papular. Rash is not scaling.  Neurological:     General: No focal deficit present.     Mental Status: He is alert and oriented to person, place, and time.  Psychiatric:        Mood  and Affect: Mood normal.        Behavior: Behavior normal.     LABORATORY DATA:  I have reviewed the labs as listed.     Latest Ref Rng & Units 10/03/2022   12:26 PM 08/03/2022    1:09 PM 06/22/2022    2:18 PM  CBC  WBC 4.0 - 10.5 K/uL 4.0  4.8  3.8   Hemoglobin 13.0 - 17.0 g/dL 6.4  9.4  9.4   Hematocrit 39.0 - 52.0 % 20.9  28.9  29.4   Platelets 150 - 400 K/uL 255  204  192       Latest Ref Rng & Units 10/03/2022   12:26 PM 08/31/2022    1:00 PM 08/03/2022    1:09 PM  CMP  Glucose 70 - 99 mg/dL 186  144  206   BUN 8 - 23 mg/dL _0 Creatinine 0.61 - 1.24 mg/dL 1.20  1.30  1.02   Sodium 135 - 145 mmol/L 137  138  137   Potassium 3.5 - 5.1 mmol/L 4.6  3.6  3.7   Chloride 98 - 111 mmol/L 104  100  99   CO2 22 - 32 mmol/L 24  25  27   Calcium 8.9 - 10.3 mg/dL 9.1  9.4  9.2   Total Protein 6.5 - 8.1 g/dL 6.6  6.5  6.6   Total Bilirubin 0.3 - 1.2 mg/dL 0.2  0.5  0.9   Alkaline Phos 38 - 126 U/L 53  43  44   AST 15 - 41 U/L _0 ALT 0 - 44 U/L _1 DIAGNOSTIC IMAGING:  I have independently reviewed the scans and discussed with the patient. CT Abdomen Pelvis W Contrast  Result Date: 09/30/2022 CLINICAL DATA:  Follow-up metastatic prostate carcinoma. EXAM: CT ABDOMEN AND PELVIS WITH CONTRAST TECHNIQUE: Multidetector CT imaging of the abdomen and pelvis was performed using the standard protocol following bolus administration of intravenous contrast. RADIATION DOSE REDUCTION: This exam was performed according to the departmental dose-optimization program which includes automated exposure control, adjustment of the mA and/or kV according to patient size and/or use of iterative reconstruction technique. CONTRAST:  149m OMNIPAQUE IOHEXOL 300 MG/ML  SOLN COMPARISON:  06/02/2022 FINDINGS: Lower Chest: No acute findings. Hepatobiliary: No hepatic masses identified. Prior cholecystectomy. No evidence of biliary obstruction. Pancreas: Stable pancreatic atrophy and  ductal dilatation. A thick-walled peripancreatic fluid collection is seen between the pancreatic body and stomach measures 2.6 x 2.1 cm, decreased in size from 3.9 by 2.1 cm previously. This is consistent with a small pancreatic pseudocyst. No acute inflammatory changes identified. Spleen: Within normal limits in size and appearance. Adrenals/Urinary Tract: No suspicious masses identified. No evidence of ureteral calculi or hydronephrosis. Stomach/Bowel: No evidence of obstruction, inflammatory process or abnormal fluid collections. Diverticulosis is seen mainly involving the sigmoid colon, however there is no evidence of diverticulitis. Vascular/Lymphatic: No pathologically enlarged lymph nodes. No acute vascular findings. 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 Reproductive: Brachytherapy seeds are again seen throughout the prostate. Other: Stable large right inguinal hernia containing numerous small bowel loops. No evidence of bowel obstruction or strangulation. Stable small left inguinal hernia containing only fat. Musculoskeletal: Diffuse sclerotic bone metastases show no significant change compared to prior exam. IMPRESSION: Diffuse sclerotic bone metastases, without significant change. No evidence of soft tissue metastatic disease within the abdomen or pelvis. Chronic pancreatitis, with decreased size of small pancreatic pseudocyst. 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 These are likely the sequelae of previous pancreatitis. Stable large right inguinal hernia containing numerous small bowel loops. No evidence of bowel obstruction or strangulation. Colonic diverticulosis, without radiographic evidence of diverticulitis. Electronically Signed   By: JMarlaine HindM.D.   On:  09/30/2022 12:55   NM Bone Scan Whole Body  Result Date: 09/20/2022 CLINICAL DATA:  Metastatic prostate cancer, assess treatment response EXAM: NUCLEAR MEDICINE WHOLE BODY BONE SCAN TECHNIQUE: Whole body anterior and posterior images were obtained approximately 3 hours after intravenous injection of radiopharmaceutical. RADIOPHARMACEUTICALS:  19.7 mCi Technetium-963mDP IV COMPARISON:  12/08/2021 FINDINGS: Multiple sites of abnormal tracer uptake consistent with osseous metastases. These include BILATERAL anterior ribs, posterior RIGHT scapula, pelvis, and proximal femora. Several of the sites in the ribs, pelvis, and femora are new. Site previously seen in the mid to lower thoracic spine is no longer identified. Degenerative type uptake of tracer at wrists, RIGHT elbow, sternoclavicular joints. Large RIGHT inguinal hernia. Otherwise expected urinary tract and soft tissue  distribution of tracer. IMPRESSION: Progressive osseous metastatic disease. Electronically Signed   By: Lavonia Dana M.D.   On: 09/20/2022 17:34     ASSESSMENT:  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  He has been on finasteride. - Biopsy on 12/24/2013: Right lateral mid-prostatic adenocarcinoma, Gleason 3+3=6 - Biopsy on 06/25/2020: Prostatic adenocarcinoma, T2b, Gleason 4+3=7, PSA 12.8 (adjusted for finasteride) - Bone scan on 07/06/2020 and CT on 07/29/2020 negative. - 11/09/2020: Insertion of radioactive I-125 seeds - PSA 5.4 (6/22), 53.2 (11/17/2021), 66.7 (11/22/2021), 382 (01/12/2022) - CTAP on 11/25/2021 with no evidence of metastatic disease.  New ill-defined soft tissue adjacent to the pancreatic body encasing the proximal splenic artery, sequela of interval pancreatitis although neoplasm is considered.  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 - Bone scan on 12/08/2021 with possible uptake in the lower thoracic vertebral body.  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 - CT chest without contrast on 01/03/2022 with no CT correlate in the lower thoracic spine.  No additional metastatic disease. - Degarelix  240 mg by Dr. Jeffie Pollock on 01/12/2022. - PSMA PET scan on 01/30/2022: Multiple skeletal metastasis in the axillary and appendicular skeleton with nearly every vertebral body involved with carcinoma.  Broad lesions in the sacrum and pelvis.  Focal activity in the anterior apical region of the prostate gland.  No metastatic adenopathy or visceral metastasis. [place] 3 tablets daily started around 02/08/2022, dose increased to 4 tablets daily on 03/21/2022. -He was hospitalized from 03/12/2022 through 03/14/2022 with pneumonia.  I have reviewed hospitalization records.  He reportedly fell on the night of 03/14/2022 when he lost balance while hanging his robe. - Erleada dose reduced to 3 tablets daily on 04/10/2022 due to diarrhea and rash.   Social/family history: - Lives with wife and granddaughter at home.  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  Quit smoking 50 years ago. - 2 sisters had brain cancer.  Maternal grandmother had cancer.  Niece had breast cancer.   PLAN:  Metastatic CSPC to the bones: - Last PSA has increased to 14.9 on 08/03/2022 from 5.43 on 06/22/2022. - He is taking Erleada 3 tablets daily.  Higher doses resulted in rash. - I have recommended that he discontinue Erleada.  I have recommended him to start Zytiga and prednisone daily.  We discussed side effects including but not limited to hypertension, hypokalemia, fatigue and worsening hot flashes. - Continue degarelix monthly.  RTC 3 weeks for follow-up with labs.  2.  Bone metastasis: - Continue calcium supplements.  Continue denosumab.  Calcium today is 9.1.  3.  Atrial fibrillation and history of unprovoked right leg DVT: - Continue warfarin.  No bleeding issues reported.  4.  Normocytic anemia: - Hemoglobin today 6.4 with MCV 93.   Ferritin is 8 and percent saturation 4.  Folic acid and I96 was normal. - We will give 1 unit of PRBC.  We will schedule him for Feraheme weekly x2.  5.  Hypomagnesemia: - Continue magnesium once daily.   Orders placed this encounter:  No orders of the defined types were placed in this encounter.     Derek Jack, MD [place] [phone]

## 2022-10-03 NOTE — Progress Notes (Signed)
Patient has been assessed, vital signs and labs have been reviewed by Dr. Delton Coombes. ANC, Creatinine, LFTs, and Platelets are within treatment parameters per Dr. Delton Coombes. The patient is good to proceed with treatment at this time. Patient to receive 1 unit of blood when available. Primary RN and pharmacy aware.

## 2022-10-04 ENCOUNTER — Telehealth: Payer: Self-pay

## 2022-10-04 ENCOUNTER — Other Ambulatory Visit (HOSPITAL_COMMUNITY): Payer: Self-pay

## 2022-10-04 LAB — BPAM RBC
Blood Product Expiration Date: 202311172359
ISSUE DATE / TIME: 202310241531
Unit Type and Rh: 1700

## 2022-10-04 LAB — TYPE AND SCREEN
ABO/RH(D): B POS
Antibody Screen: NEGATIVE
Unit division: 0

## 2022-10-04 NOTE — Telephone Encounter (Signed)
Oral Chemotherapy Pharmacist Encounter  I spoke with patient for overview of: Zytiga for the treatment of progressive metastatic, castration-resistant prostate cancer in conjunction with prednisone, planned duration until disease progression or unacceptable toxicity.   Counseled patient on administration, dosing, side effects, monitoring, drug-food interactions, safe handling, storage, and disposal.  Patient will take Zytiga '250mg'$  tablets, 4 tablets ('1000mg'$ ) by mouth once daily on an empty stomach, 1 hour before or 2 hours after a meal.  Patient states he will take his Zytiga at 8am and will wait at least 1 hour before eating. Patient then will take prednisone with food around 9-9:30 each morning.  Patient will take prednisone '5mg'$  tablet, 1 tablet by mouth one daily with breakfast.  Zytiga start date: 10/05/2022  Adverse effects include but are not limited to: peripheral edema, GI upset, hypertension, hot flashes, fatigue, and arthralgias.    Prednisone prescription has been sent to [facility] on 10/03/22 and patient has picked up the medication. Patient knows to start prednisone on the same day as Zytiga start.  Reviewed with patient importance of keeping a medication schedule and plan for any missed doses. No barriers to medication adherence identified.  Medication reconciliation performed and medication/allergy list updated.  Insurance authorization for Fabio Asa has been obtained. Test claim at the pharmacy revealed copayment $140 for 1st fill of 30 days. Patient and patients wife will pick up the medication from [facility] on 10/04/22.  Patient informed the pharmacy will reach out 5-7 days prior to needing next fill of Zytiga to coordinate continued medication acquisition to prevent break in therapy.  All questions answered.  Mr. Schnabel and his wife voiced understanding and appreciation.   Medication education handout placed in mail for patient. Patient  knows to call the office with questions or concerns. Oral Chemotherapy Clinic phone number provided to patient.   Drema Halon, PharmD Hematology/Oncology Clinical Pharmacist [facility] [phone] 10/04/2022 12:30 PM

## 2022-10-16 ENCOUNTER — Encounter: Payer: Self-pay | Admitting: Family Medicine

## 2022-10-16 ENCOUNTER — Ambulatory Visit (INDEPENDENT_AMBULATORY_CARE_PROVIDER_SITE_OTHER): Payer: No Typology Code available for payment source | Admitting: Family Medicine

## 2022-10-16 VITALS — BP 134/66 | HR 66 | Temp 97.3°F | Ht 65.0 in | Wt 142.0 lb

## 2022-10-16 DIAGNOSIS — I480 Paroxysmal atrial fibrillation: Secondary | ICD-10-CM

## 2022-10-16 DIAGNOSIS — I422 Other hypertrophic cardiomyopathy: Secondary | ICD-10-CM

## 2022-10-16 DIAGNOSIS — E1159 Type 2 diabetes mellitus with other circulatory complications: Secondary | ICD-10-CM

## 2022-10-16 DIAGNOSIS — I152 Hypertension secondary to endocrine disorders: Secondary | ICD-10-CM

## 2022-10-16 DIAGNOSIS — D6859 Other primary thrombophilia: Secondary | ICD-10-CM | POA: Diagnosis not present

## 2022-10-16 DIAGNOSIS — Z86718 Personal history of other venous thrombosis and embolism: Secondary | ICD-10-CM

## 2022-10-16 DIAGNOSIS — E1169 Type 2 diabetes mellitus with other specified complication: Secondary | ICD-10-CM | POA: Diagnosis not present

## 2022-10-16 DIAGNOSIS — Z7901 Long term (current) use of anticoagulants: Secondary | ICD-10-CM | POA: Diagnosis not present

## 2022-10-16 DIAGNOSIS — E785 Hyperlipidemia, unspecified: Secondary | ICD-10-CM | POA: Diagnosis not present

## 2022-10-16 LAB — COAGUCHEK XS/INR WAIVED
INR: 1.7 — ABNORMAL HIGH (ref 0.9–1.1)
Prothrombin Time: 20.4 s

## 2022-10-16 LAB — BAYER DCA HB A1C WAIVED: HB A1C (BAYER DCA - WAIVED): 5.6 % (ref 4.8–5.6)

## 2022-10-16 NOTE — Progress Notes (Signed)
BP 134/66   Pulse 66   Temp (!) 97.3 F (36.3 C)   Ht _0  (1.651 m)   Wt 142 lb (64.4 kg)   SpO2 99%   BMI 23.63 kg/m    Subjective:   Patient ID: Alan Netters., male    DOB: [birth year], 81 y.o.   MRN: [id number]  HPI: Alan Woodrome. is a 81 y.o. male presenting on 10/16/2022 for Medical Management of Chronic Issues, Diabetes, and Atrial Fibrillation   HPI Hypertension Patient is currently on amlodipine and lisinopril and metoprolol, and their blood pressure today is 134/66. Patient denies any lightheadedness or dizziness. Patient denies headaches, blurred vision, chest pains, shortness of breath, or weakness. Denies any side effects from medication and is content with current medication.   Hyperlipidemia Patient is coming in for recheck of his hyperlipidemia. The patient is currently taking pravastatin. They deny any issues with myalgias or history of liver damage from it. They deny any focal numbness or weakness or chest pain.   Coumadin recheck Target goal: 2.0-3.0 Reason on anticoagulation: Paroxysmal A-fib and history of DVTs Patient denies any bruising or bleeding or chest pain or palpitations   Anemia Patient is coming in today for anemia recheck.  His hemoglobin was down the last time.  His hematologist is managing this, likely related to hematology issues.  Will recheck today.  He says they started him on 2 medicines to help boost his blood counts, thinking that they started with erythropoietin or something like that.  Relevant past medical, surgical, family and social history reviewed and updated as indicated. Interim medical history since our last visit reviewed. Allergies and medications reviewed and updated.  Review of Systems  Constitutional:  Negative for chills and fever.  Eyes:  Negative for visual disturbance.  Respiratory:  Negative for shortness of breath and wheezing.   Cardiovascular:  Negative for chest pain and leg swelling.  Gastrointestinal:   Negative for anal bleeding and blood in stool.  Genitourinary:  Negative for hematuria.  Musculoskeletal:  Negative for back pain and gait problem.  Skin:  Negative for rash.  Neurological:  Negative for dizziness and light-headedness.  All other systems reviewed and are negative.   Per HPI unless specifically indicated above   Allergies as of 10/16/2022       Reactions   Allopurinol Other (See Comments)   Significantly decreased WBC's   Colchicine Other (See Comments)   Significantly decreased WBC's        Medication List        Accurate as of October 16, 2022  1:30 PM. If you have any questions, ask your nurse or doctor.          abiraterone acetate 250 MG tablet Commonly known as: ZYTIGA Take 4 tablets (1,000 mg total) by mouth daily. Take on an empty stomach 1 hour before or 2 hours after a meal   acetaminophen 500 MG tablet Commonly known as: TYLENOL Take 1,000 mg by mouth every 6 (six) hours as needed for moderate pain.   amLODipine 10 MG tablet Commonly known as: NORVASC Take 1 tablet (10 mg total) by mouth every evening.   Blood Pressure Kit 1 each by Does not apply route daily.   CALCIUM 600 + D PO Take 1 tablet by mouth in the morning and at bedtime.   calcium carbonate 1500 (600 Ca) MG Tabs tablet Commonly known as: OSCAL Take by mouth 2 (two) times daily with a meal.   chlorthalidone  25 MG tablet Commonly known as: HYGROTON Take 1 tablet by mouth once daily   diclofenac Sodium 1 % Gel Commonly known as: VOLTAREN Apply 1 application topically 2 (two) times daily as needed (pain).   ferrous sulfate 325 (65 FE) MG tablet Commonly known as: SV Iron Take 1 tablet (325 mg total) by mouth daily with breakfast.   fluticasone 50 MCG/ACT nasal spray Commonly known as: FLONASE Place 1 spray into both nostrils 2 (two) times daily as needed for allergies or rhinitis.   glucose blood test strip Commonly known as: ONE TOUCH ULTRA TEST Use to check  BG once daily.  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 Can take every 4 hours if needed.   lisinopril 40 MG tablet Commonly known as: ZESTRIL Take 1 tablet (40 mg total) by mouth daily.   magnesium oxide 400 (240 Mg) MG tablet Commonly known as: MAG-OX Take 1 tablet (400 mg total) by mouth daily.   metFORMIN 1000 MG tablet Commonly known as: GLUCOPHAGE Take 1 tablet (1,000 mg total) by mouth 2 (two) times daily with a meal.   metoprolol succinate 100 MG 24 hr tablet Commonly known as: TOPROL-XL Take 1 tablet (100 mg total) by mouth daily. Take with or immediately following a meal.   omeprazole 20 MG capsule Commonly known as: PRILOSEC Take 1 capsule (20 mg total) by mouth daily.   ONE TOUCH ULTRA MINI w/Device Kit 1 each by Does not apply route 2 (two) times daily.   potassium chloride SA 20 MEQ tablet Commonly known as: KLOR-CON M TAKE 1  BY MOUTH ONCE DAILY Strength: 20 mEq   pravastatin 40 MG tablet Commonly known as: PRAVACHOL Take 1 tablet (40 mg total) by mouth every evening.   predniSONE 5 MG tablet Commonly known as: DELTASONE Take 1 tablet (5 mg total) by mouth daily with breakfast.   warfarin 10 MG tablet Commonly known as: Coumadin Take as directed by the anticoagulation clinic. If you are unsure how to take this medication, talk to your nurse or doctor. Original instructions: Take 1 tablet (10 mg total) by mouth daily.         Objective:   BP 134/66   Pulse 66   Temp (!) 97.3 F (36.3 C)   Ht _0  (1.651 m)   Wt 142 lb (64.4 kg)   SpO2 99%   BMI 23.63 kg/m   Wt Readings from Last 3 Encounters:  10/16/22 142 lb (64.4 kg)  10/03/22 146 lb 1.6 oz (66.3 kg)  09/04/22 142 lb (64.4 kg)    Physical Exam Vitals and nursing note reviewed.  Constitutional:      General: He is not in acute distress.     Appearance: He is well-developed. He is not diaphoretic.  Eyes:     General: No scleral icterus.    Conjunctiva/sclera: Conjunctivae normal.  Neck:     Thyroid: No thyromegaly.  Cardiovascular:     Rate and Rhythm: Normal rate. Rhythm irregular.     Heart sounds: Normal heart sounds. No murmur heard. Pulmonary:     Effort: Pulmonary effort is normal. No respiratory distress.     Breath sounds: Normal breath sounds. No wheezing.  Musculoskeletal:        General: Normal range of motion.     Cervical back: Neck supple.  Lymphadenopathy:  Cervical: No cervical adenopathy.  Skin:    General: Skin is warm and dry.     Findings: No rash.  Neurological:     Mental Status: He is alert and oriented to person, place, and time.     Coordination: Coordination normal.  Psychiatric:        Behavior: Behavior normal.       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[facility])   Relevant Orders   CBC with Differential/Platelet     Endocrine   Hyperlipidemia associated with type 2 diabetes mellitus ([facility]) - Primary   Relevant Orders   Lipid panel   CoaguChek XS/INR Waived   Bayer DCA Hb A1c Waived   CBC with Differential/Platelet     Hematopoietic and Hemostatic   Primary hypercoagulable state ([facility]) [D68.59]     Other   Long term (current) use of anticoagulants [Z79.01]   Relevant Orders   Lipid panel   CoaguChek XS/INR Waived   CBC with Differential/Platelet   History of DVT (deep vein thrombosis)   Relevant Orders   CBC with Differential/Platelet    Description   Increase to take 1-1/2 tablets or 15 mg every Monday and then continue 10 mg on the rest of the week.   INR was 1.7 (goal 2-3)    Follow-up 4-6weeks     Continue current medicine except for the adjustment on the Coumadin doses.  INR was 5.6 today. Follow up plan: Return for 4-week INR recheck.  Counseling provided for all of the vaccine components Orders Placed This Encounter  Procedures   Lipid panel   CoaguChek XS/INR Waived   Bayer DCA Hb A1c Waived   CBC with Differential/Platelet    Caryl Pina, MD [facility] 10/16/2022, 1:30 PM

## 2022-10-17 ENCOUNTER — Other Ambulatory Visit: Payer: Self-pay | Admitting: Family

## 2022-10-17 ENCOUNTER — Other Ambulatory Visit (HOSPITAL_COMMUNITY): Payer: Self-pay

## 2022-10-17 LAB — CBC WITH DIFFERENTIAL/PLATELET
Basophils Absolute: 0 10*3/uL (ref 0.0–0.2)
Basos: 0 %
EOS (ABSOLUTE): 0.1 10*3/uL (ref 0.0–0.4)
Eos: 2 %
Hematocrit: 25.3 % — ABNORMAL LOW (ref 37.5–51.0)
Hemoglobin: 7.8 g/dL — CL (ref 13.0–17.7)
Immature Grans (Abs): 0 10*3/uL (ref 0.0–0.1)
Immature Granulocytes: 0 %
Lymphocytes Absolute: 0.6 10*3/uL — ABNORMAL LOW (ref 0.7–3.1)
Lymphs: 12 %
MCH: 25.8 pg — ABNORMAL LOW (ref 26.6–33.0)
MCHC: 30.8 g/dL — ABNORMAL LOW (ref 31.5–35.7)
MCV: 84 fL (ref 79–97)
Monocytes Absolute: 0.5 10*3/uL (ref 0.1–0.9)
Monocytes: 10 %
Neutrophils Absolute: 3.8 10*3/uL (ref 1.4–7.0)
Neutrophils: 76 %
Platelets: 332 10*3/uL (ref 150–450)
RBC: 3.02 x10E6/uL — ABNORMAL LOW (ref 4.14–5.80)
RDW: 17.7 % — ABNORMAL HIGH (ref 11.6–15.4)
WBC: 5 10*3/uL (ref 3.4–10.8)

## 2022-10-17 LAB — LIPID PANEL
Chol/HDL Ratio: 2.4 ratio (ref 0.0–5.0)
Cholesterol, Total: 90 mg/dL — ABNORMAL LOW (ref 100–199)
HDL: 37 mg/dL — ABNORMAL LOW (ref >39)
LDL Chol Calc (NIH): 32 mg/dL (ref 0–99)
Triglycerides: 118 mg/dL (ref 0–149)
VLDL Cholesterol Cal: 21 mg/dL (ref 5–40)

## 2022-10-18 ENCOUNTER — Other Ambulatory Visit (HOSPITAL_COMMUNITY): Payer: Self-pay

## 2022-10-18 ENCOUNTER — Telehealth: Payer: Self-pay

## 2022-10-18 NOTE — Telephone Encounter (Signed)
Attempted to call pt , only # in chart not working

## 2022-10-24 ENCOUNTER — Other Ambulatory Visit (HOSPITAL_COMMUNITY): Payer: Self-pay

## 2022-10-26 ENCOUNTER — Other Ambulatory Visit (HOSPITAL_COMMUNITY): Payer: Self-pay

## 2022-10-31 ENCOUNTER — Inpatient Hospital Stay: Payer: No Typology Code available for payment source

## 2022-10-31 ENCOUNTER — Other Ambulatory Visit (HOSPITAL_COMMUNITY): Payer: Self-pay

## 2022-10-31 ENCOUNTER — Inpatient Hospital Stay: Payer: No Typology Code available for payment source | Attending: Hematology

## 2022-10-31 ENCOUNTER — Inpatient Hospital Stay (HOSPITAL_BASED_OUTPATIENT_CLINIC_OR_DEPARTMENT_OTHER): Payer: No Typology Code available for payment source | Admitting: Hematology

## 2022-10-31 VITALS — BP 139/61 | HR 69 | Temp 97.9°F | Resp 18

## 2022-10-31 VITALS — BP 159/66 | HR 78 | Temp 98.2°F | Resp 18 | Ht 64.96 in | Wt 145.1 lb

## 2022-10-31 DIAGNOSIS — R200 Anesthesia of skin: Secondary | ICD-10-CM | POA: Diagnosis not present

## 2022-10-31 DIAGNOSIS — I10 Essential (primary) hypertension: Secondary | ICD-10-CM | POA: Insufficient documentation

## 2022-10-31 DIAGNOSIS — C61 Malignant neoplasm of prostate: Secondary | ICD-10-CM | POA: Diagnosis not present

## 2022-10-31 DIAGNOSIS — C7951 Secondary malignant neoplasm of bone: Secondary | ICD-10-CM

## 2022-10-31 DIAGNOSIS — Z803 Family history of malignant neoplasm of breast: Secondary | ICD-10-CM | POA: Insufficient documentation

## 2022-10-31 DIAGNOSIS — K5900 Constipation, unspecified: Secondary | ICD-10-CM | POA: Diagnosis not present

## 2022-10-31 DIAGNOSIS — Z86718 Personal history of other venous thrombosis and embolism: Secondary | ICD-10-CM | POA: Insufficient documentation

## 2022-10-31 DIAGNOSIS — E119 Type 2 diabetes mellitus without complications: Secondary | ICD-10-CM | POA: Diagnosis not present

## 2022-10-31 DIAGNOSIS — E785 Hyperlipidemia, unspecified: Secondary | ICD-10-CM | POA: Insufficient documentation

## 2022-10-31 DIAGNOSIS — D649 Anemia, unspecified: Secondary | ICD-10-CM | POA: Insufficient documentation

## 2022-10-31 DIAGNOSIS — Z818 Family history of other mental and behavioral disorders: Secondary | ICD-10-CM | POA: Insufficient documentation

## 2022-10-31 DIAGNOSIS — Z8249 Family history of ischemic heart disease and other diseases of the circulatory system: Secondary | ICD-10-CM | POA: Diagnosis not present

## 2022-10-31 DIAGNOSIS — Z87891 Personal history of nicotine dependence: Secondary | ICD-10-CM | POA: Diagnosis not present

## 2022-10-31 DIAGNOSIS — R0602 Shortness of breath: Secondary | ICD-10-CM | POA: Diagnosis not present

## 2022-10-31 DIAGNOSIS — I4891 Unspecified atrial fibrillation: Secondary | ICD-10-CM | POA: Insufficient documentation

## 2022-10-31 DIAGNOSIS — Z832 Family history of diseases of the blood and blood-forming organs and certain disorders involving the immune mechanism: Secondary | ICD-10-CM | POA: Diagnosis not present

## 2022-10-31 DIAGNOSIS — Z808 Family history of malignant neoplasm of other organs or systems: Secondary | ICD-10-CM | POA: Diagnosis not present

## 2022-10-31 DIAGNOSIS — Z86010 Personal history of colonic polyps: Secondary | ICD-10-CM | POA: Diagnosis not present

## 2022-10-31 LAB — COMPREHENSIVE METABOLIC PANEL
ALT: 9 U/L (ref 0–44)
AST: 20 U/L (ref 15–41)
Albumin: 3.6 g/dL (ref 3.5–5.0)
Alkaline Phosphatase: 59 U/L (ref 38–126)
Anion gap: 9 (ref 5–15)
BUN: 18 mg/dL (ref 8–23)
CO2: 27 mmol/L (ref 22–32)
Calcium: 9.1 mg/dL (ref 8.9–10.3)
Chloride: 101 mmol/L (ref 98–111)
Creatinine, Ser: 1.09 mg/dL (ref 0.61–1.24)
GFR, Estimated: >60 mL/min (ref >60)
Glucose, Bld: 165 mg/dL — ABNORMAL HIGH (ref 70–99)
Potassium: 3.2 mmol/L — ABNORMAL LOW (ref 3.5–5.1)
Sodium: 137 mmol/L (ref 135–145)
Total Bilirubin: 0.5 mg/dL (ref 0.3–1.2)
Total Protein: 6.9 g/dL (ref 6.5–8.1)

## 2022-10-31 LAB — CBC WITH DIFFERENTIAL/PLATELET
Abs Immature Granulocytes: 0.02 10*3/uL (ref 0.00–0.07)
Basophils Absolute: 0 10*3/uL (ref 0.0–0.1)
Basophils Relative: 0 %
Eosinophils Absolute: 0.1 10*3/uL (ref 0.0–0.5)
Eosinophils Relative: 3 %
HCT: 26.7 % — ABNORMAL LOW (ref 39.0–52.0)
Hemoglobin: 8.1 g/dL — ABNORMAL LOW (ref 13.0–17.0)
Immature Granulocytes: 1 %
Lymphocytes Relative: 14 %
Lymphs Abs: 0.6 10*3/uL — ABNORMAL LOW (ref 0.7–4.0)
MCH: 26.3 pg (ref 26.0–34.0)
MCHC: 30.3 g/dL (ref 30.0–36.0)
MCV: 86.7 fL (ref 80.0–100.0)
Monocytes Absolute: 0.3 10*3/uL (ref 0.1–1.0)
Monocytes Relative: 8 %
Neutro Abs: 3.3 10*3/uL (ref 1.7–7.7)
Neutrophils Relative %: 74 %
Platelets: 234 10*3/uL (ref 150–400)
RBC: 3.08 MIL/uL — ABNORMAL LOW (ref 4.22–5.81)
RDW: 17.7 % — ABNORMAL HIGH (ref 11.5–15.5)
WBC: 4.4 10*3/uL (ref 4.0–10.5)
nRBC: 0 % (ref 0.0–0.2)

## 2022-10-31 LAB — MAGNESIUM: Magnesium: 1.8 mg/dL (ref 1.7–2.4)

## 2022-10-31 LAB — PSA: Prostatic Specific Antigen: 84.94 ng/mL — ABNORMAL HIGH (ref 0.00–4.00)

## 2022-10-31 MED ORDER — LORATADINE 10 MG PO TABS
10.0000 mg | ORAL_TABLET | Freq: Once | ORAL | Status: AC
  Administered 2022-10-31: 10 mg via ORAL
  Filled 2022-10-31: qty 1

## 2022-10-31 MED ORDER — SODIUM CHLORIDE 0.9 % IV SOLN: Freq: Once | INTRAVENOUS | Status: AC

## 2022-10-31 MED ORDER — ACETAMINOPHEN 325 MG PO TABS
650.0000 mg | ORAL_TABLET | Freq: Once | ORAL | Status: AC
  Administered 2022-10-31: 650 mg via ORAL
  Filled 2022-10-31: qty 2

## 2022-10-31 MED ORDER — DENOSUMAB 120 MG/1.7ML ~~LOC~~ SOLN
120.0000 mg | Freq: Once | SUBCUTANEOUS | Status: AC
  Administered 2022-10-31: 120 mg via SUBCUTANEOUS
  Filled 2022-10-31: qty 1.7

## 2022-10-31 MED ORDER — SODIUM CHLORIDE 0.9 % IV SOLN
510.0000 mg | Freq: Once | INTRAVENOUS | Status: AC
  Administered 2022-10-31: 510 mg via INTRAVENOUS
  Filled 2022-10-31: qty 510

## 2022-10-31 NOTE — Progress Notes (Signed)
Pt presents today for Feraheme IV iron infusion and Xgeva injection per provider's order. Vital signs stable and pt voiced no new complaints at this time. Pt's Calcium noted to be 9.1 today.   Peripheral IV started with good blood return pre and post infusion. Pt denies tooth and jaw pain at this time and no recent or future dental appointments at this time. Pt reports taking Calcium and Vit D supplements as directed.   Shirlean Kelly  and Delton See given today per MD orders. Tolerated infusion without adverse affects. Vital signs stable. No complaints at this time. Discharged from clinic ambulatory with cane  in stable condition. Alert and oriented x 3. F/U with [facility] as scheduled.

## 2022-10-31 NOTE — Patient Instructions (Signed)
[facility]  Discharge Instructions: Thank you for choosing [facility] to provide your oncology and hematology care.  If you have a lab appointment with the [facility], please come in thru the Main Entrance and check in at the main information desk.  Wear comfortable clothing and clothing appropriate for easy access to any Portacath or PICC line.   We strive to give you quality time with your provider. You may need to reschedule your appointment if you arrive late (15 or more minutes).  Arriving late affects you and other patients whose appointments are after yours.  Also, if you miss three or more appointments without notifying the office, you may be dismissed from the clinic at the provider's discretion.      For prescription refill requests, have your pharmacy contact our office and allow 72 hours for refills to be completed.    Today you received Feraheme IV iron infusion and Xgeva injection.     BELOW ARE SYMPTOMS THAT SHOULD BE REPORTED IMMEDIATELY: *FEVER GREATER THAN 100.4 F (38 C) OR HIGHER *CHILLS OR SWEATING *NAUSEA AND VOMITING THAT IS NOT CONTROLLED WITH YOUR NAUSEA MEDICATION *UNUSUAL SHORTNESS OF BREATH *UNUSUAL BRUISING OR BLEEDING *URINARY PROBLEMS (pain or burning when urinating, or frequent urination) *BOWEL PROBLEMS (unusual diarrhea, constipation, pain near the anus) TENDERNESS IN MOUTH AND THROAT WITH OR WITHOUT PRESENCE OF ULCERS (sore throat, sores in mouth, or a toothache) UNUSUAL RASH, SWELLING OR PAIN  UNUSUAL VAGINAL DISCHARGE OR ITCHING   Items with * indicate a potential emergency and should be followed up as soon as possible or go to the Emergency Department if any problems should occur.  Please show the CHEMOTHERAPY ALERT CARD or IMMUNOTHERAPY ALERT CARD at check-in to the Emergency Department and triage nurse.  Should you have questions after your visit or need to cancel or reschedule your appointment, please  contact [facility] [phone]  and follow the prompts.  Office hours are 8:00 a.m. to 4:30 p.m. Monday - Friday. Please note that voicemails left after 4:00 p.m. may not be returned until the following business day.  We are closed weekends and major holidays. You have access to a nurse at all times for urgent questions. Please call the main number to the clinic [phone] and follow the prompts.  For any non-urgent questions, you may also contact your provider using MyChart. We now offer e-Visits for anyone 35 and older to request care online for non-urgent symptoms. For details visit [email or web address].   Also download the MyChart app! Go to the app store, search "MyChart", open the app, select [facility], and log in with your MyChart username and password.  Masks are optional in the cancer centers. If you would like for your care team to wear a mask while they are taking care of you, please let them know. You may have one support person who is at least 79 years old accompany you for your appointments.

## 2022-10-31 NOTE — Progress Notes (Signed)
[place], 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  PRIOR THERAPY: Erleada from 02/08/2022 through 10/03/2022  NGS Results: not done  CURRENT THERAPY: Firmagon and Zytiga  BRIEF ONCOLOGIC HISTORY:  Oncology History  Prostate cancer ([place])  04/13/2014 Initial Diagnosis   Prostate cancer ([place])   06/25/2020 Cancer Staging   Staging form: Prostate, AJCC 8th Edition - Clinical stage from 06/25/2020: Stage IIC (cT2b, cN0, cM0, PSA: 12.8, Grade Group: 3) - Signed by Freeman Caldron, PA-C on 09/29/2020     CANCER STAGING:  Cancer Staging  Prostate cancer [facility]) Staging form: Prostate, AJCC 8th Edition - Clinical stage from 06/25/2020: Stage IIC (cT2b, cN0, cM0, PSA: 12.8, Grade Group: 3) - Signed by Freeman Caldron, PA-C on 09/29/2020 - Pathologic stage from 01/26/2022: Stage IVB (pT2, pN0, pM1b, Grade Group: 3) - Unsigned   INTERVAL HISTORY:  Mr. Matty Vanroekel., a 81 y.o. 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  He started taking Zytiga and prednisone on 10/05/2022.  Reports he that he is tolerating it very well so far.  Denies any GI side effects.  REVIEW OF SYSTEMS:  Review of Systems  Respiratory:  Positive for shortness of breath.   Gastrointestinal:  Positive for constipation.  Genitourinary:  Negative for hematuria.   Neurological:  Positive for numbness (L leg).  Hematological:  Does not bruise/bleed easily.  Psychiatric/Behavioral:  Negative for sleep disturbance.   All other systems reviewed and are negative.   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   found during hospital stay with pancreatitis,  started coumadin;   last Abd CT in epic 07-29-2020  chronic thrombus portal venous   GERD (gastroesophageal reflux disease)    Heart murmur    History of acute pancreatitis    2016   necrotizing pancreatitis   History of adenomatous polyp of colon    History of DVT of lower extremity    History of GI bleed 08/2019   upper gi bleed due to duodenal ulcer   History of gout yrs ago   Hypertension    followed by pcp   Hypertrophic cardiomyopathy ([place])    followed by cardiology   Internal hemorrhoids    Mixed hyperlipidemia    Normocytic anemia    PAF (paroxysmal atrial fibrillation) ([place]) 04/2016   cardiologist-- dr h. Tamala Julian--  event monitor 07-04-2017 epic, NSR/ PAflutter with occasional RVR/  PACs/ PVCs ;  echo 05-16-2017 epic,  moderate LVH with severe asymptomic septal hypertrophy, G1DD, EF 60-65%,  mild AR, mild LAE   Pancreatic pseudocyst    Peyronie's disease    Prostate cancer [facility]) urologist-- dr wrenn/ oncologist-- dr Tammi Klippel   first dx 08/ 2009 Gleason 3+3 active survelliance;  until bx 07/ 2021  Stage T2b, Gleason 4+3   Right inguinal hernia    Type 2 diabetes mellitus ([place])    followed by pcp---  (11-03-2020 per pt currently not checking blood sugar due to glucose monitor  broken)   Wears glasses    Wears partial dentures    upper and lower   Past Surgical History:  Procedure Laterality Date   BIOPSY  02/09/2022   Procedure: BIOPSY;  Surgeon: Milus Banister, MD;  Location: [facility];  Service: Endoscopy;;   CHOLECYSTECTOMY N/A 11/26/2015   Procedure: LAPAROSCOPIC CHOLECYSTECTOMY;  Surgeon: Aviva Signs, MD;  Location: [facility];  Service: General;  Laterality: N/A;   COLONOSCOPY  last one 09-06-2020  dr stark   CYSTOSCOPY N/A 11/09/2020   Procedure: Erlene Quan;  Surgeon: Irine Seal, MD;  Location: [facility];  Service:  Urology;  Laterality: N/A;   ESOPHAGOGASTRODUODENOSCOPY  10-06-2019  _0    ESOPHAGOGASTRODUODENOSCOPY N/A 02/09/2022   Procedure: ESOPHAGOGASTRODUODENOSCOPY (EGD);  Surgeon: Milus Banister, MD;  Location: [facility];  Service: Endoscopy;  Laterality: N/A;   EUS N/A 02/09/2022   Procedure: UPPER ENDOSCOPIC ULTRASOUND (EUS) RADIAL;  Surgeon: Milus Banister, MD;  Location: [facility];  Service: Endoscopy;  Laterality: N/A;   FLEXIBLE BRONCHOSCOPY Bilateral 04/14/2016   Procedure: FLEXIBLE BRONCHOSCOPY;  Surgeon: Sinda Du, MD;  [facility];  Service: Cardiopulmonary;  Laterality: Bilateral;   HEMORRHOID SURGERY  yrs ago   RADIOACTIVE SEED IMPLANT N/A 11/09/2020   Procedure: RADIOACTIVE SEED IMPLANT/BRACHYTHERAPY IMPLANT;  Surgeon: Irine Seal, MD;  Location: [facility];  Service: Urology;  Laterality: N/A;   SPACE OAR INSTILLATION N/A 11/09/2020   Procedure: SPACE OAR INSTILLATION;  Surgeon: Irine Seal, MD;  Location: [facility];  Service: Urology;  Laterality: N/A;   UMBILICAL HERNIA REPAIR N/A 11/26/2015   Procedure: UMBILICAL HERNIORRHAPHY;  Surgeon: Aviva Signs, MD;  Location: [facility];  Service: General;  Laterality: N/A;    SOCIAL HISTORY:  Social History   Socioeconomic History   Marital status: Married    Spouse name: [name]   Number of children: 4   Years of education: Not on file   Highest education level: 10th grade  Occupational History   Occupation: Retired     Fish farm manager: [employer]  Tobacco Use   Smoking status: Former    Years: 5.00    Types: Cigarettes    Quit date: 12/11/1986    Years since quitting: 35.9   Smokeless tobacco: Never  Vaping Use   Vaping Use: Never used  Substance and Sexual Activity   Alcohol use: No   Drug use: Never   Sexual activity: Yes  Other Topics Concern   Not on file  Social History Narrative   Not on file   Social Determinants of Health   Financial Resource Strain: Low Risk   (08/12/2021)   Overall Financial Resource Strain (CARDIA)    Difficulty of Paying Living Expenses: Not hard at all  Food Insecurity: No Food Insecurity (08/12/2021)   Hunger Vital Sign    Worried About Running Out of Food in the Last Year: Never true    [place] in the Last Year: Never true  Transportation Needs: No Transportation Needs (08/12/2021)   PRAPARE - Hydrologist (Medical): No    Lack of Transportation (Non-Medical): No  Physical Activity: Inactive (10/25/2021)   Exercise Vital Sign    Days of Exercise per Week: 0 days    Minutes of Exercise per Session: 0 min  Stress: Stress Concern Present (10/25/2021)   [place]    Feeling of Stress : To some extent  Social Connections: Socially  Integrated (08/12/2021)   Social Connection and Isolation Panel [NHANES]    Frequency of Communication with Friends and Family: Three times a week    Frequency of Social Gatherings with Friends and Family: Three times a week    Attends Religious Services: More than 4 times per year    Active Member of Clubs or Organizations: Yes    Attends Archivist Meetings: More than 4 times per year    Marital Status: Married  Human resources officer Violence: Not At Risk (08/12/2021)   Humiliation, Afraid, Rape, and Kick questionnaire    Fear of Current or Ex-Partner: No    Emotionally Abused: No    Physically Abused: No    Sexually Abused: No    FAMILY HISTORY:  Family History  Problem Relation Age of Onset   Stroke Mother    Hypertension Mother    Alzheimer's disease Mother    Hypertension Father    Brain cancer Sister    Breast cancer Sister    Hypertension Sister    Deep vein thrombosis Brother    Hypertension Brother    Colon cancer Neg Hx    Pancreatic disease Neg Hx    Esophageal cancer Neg Hx    Rectal cancer Neg Hx    Stomach cancer Neg Hx     CURRENT MEDICATIONS:  Current Outpatient  Medications  Medication Sig Dispense Refill   abiraterone acetate (ZYTIGA) 250 MG tablet Take 4 tablets (1,000 mg total) by mouth daily. Take on an empty stomach 1 hour before or 2 hours after a meal 120 tablet 3   acetaminophen (TYLENOL) 500 MG tablet Take 1,000 mg by mouth every 6 (six) hours as needed for moderate pain.     amLODipine (NORVASC) 10 MG tablet Take 1 tablet (10 mg total) by mouth every evening. 90 tablet 3   Blood Glucose Monitoring Suppl (ONE TOUCH ULTRA MINI) w/Device KIT 1 each by Does not apply route 2 (two) times daily. 1 kit 1   Blood Pressure KIT 1 each by Does not apply route daily. 1 kit 0   Calcium Carb-Cholecalciferol (CALCIUM 600 + D PO) Take 1 tablet by mouth in the morning and at bedtime.     calcium carbonate (OSCAL) 1500 (600 Ca) MG TABS tablet Take by mouth 2 (two) times daily with a meal.     chlorthalidone (HYGROTON) 25 MG tablet Take 1 tablet by mouth once daily 90 tablet 3   diclofenac Sodium (VOLTAREN) 1 % GEL Apply 1 application topically 2 (two) times daily as needed (pain).     ferrous sulfate (SV IRON) 325 (65 FE) MG tablet Take 1 tablet (325 mg total) by mouth daily with breakfast. 90 tablet 1   fluticasone (FLONASE) 50 MCG/ACT nasal spray Place 1 spray into both nostrils 2 (two) times daily as needed for allergies or rhinitis. 16 g 6   glucose blood (ONE TOUCH ULTRA TEST) test strip Use to check BG once daily.  Dx:  Type 2 DM controlled E11.9 100 each 4   HYDROcodone-acetaminophen (NORCO/VICODIN) 5-325 MG tablet Take 1 tablet by mouth every 6 (six) hours as needed for moderate pain. Can take every 4 hours if needed. 30 tablet 0   lisinopril (ZESTRIL) 40 MG tablet Take 1 tablet (40 mg total) by mouth daily. 90 tablet 1   magnesium oxide (MAG-OX) 400 (240 Mg) MG tablet Take 1 tablet (400 mg total) by mouth daily. 90 tablet 6   metFORMIN (GLUCOPHAGE) 1000 MG tablet Take 1  tablet (1,000 mg total) by mouth 2 (two) times daily with a meal. 180 tablet 3    metoprolol succinate (TOPROL-XL) 100 MG 24 hr tablet Take 1 tablet (100 mg total) by mouth daily. Take with or immediately following a meal. 90 tablet 3   omeprazole (PRILOSEC) 20 MG capsule Take 1 capsule (20 mg total) by mouth daily. 90 capsule 3   potassium chloride SA (KLOR-CON M) 20 MEQ tablet TAKE 1  BY MOUTH ONCE DAILY Strength: 20 mEq 90 tablet 1   pravastatin (PRAVACHOL) 40 MG tablet Take 1 tablet (40 mg total) by mouth every evening. 90 tablet 3   predniSONE (DELTASONE) 5 MG tablet Take 1 tablet (5 mg total) by mouth daily with breakfast. 90 tablet 2   warfarin (COUMADIN) 10 MG tablet Take 1 tablet (10 mg total) by mouth daily. 90 tablet 3   No current facility-administered medications for this visit.    ALLERGIES:  Allergies  Allergen Reactions   Allopurinol Other (See Comments)    Significantly decreased WBC's   Colchicine Other (See Comments)    Significantly decreased WBC's    PHYSICAL EXAM:  Performance status (ECOG): 1 - Symptomatic but completely ambulatory  There were no vitals filed for this visit.  Wt Readings from Last 3 Encounters:  10/16/22 142 lb (64.4 kg)  10/03/22 146 lb 1.6 oz (66.3 kg)  09/04/22 142 lb (64.4 kg)   Physical Exam Vitals reviewed.  Constitutional:      Appearance: Normal appearance.     Comments: In wheelchair  Cardiovascular:     Rate and Rhythm: Normal rate and regular rhythm.     Pulses: Normal pulses.     Heart sounds: Normal heart sounds.  Pulmonary:     Effort: Pulmonary effort is normal.     Breath sounds: Normal breath sounds.  Skin:    Findings: Erythema (lesions on thighs bilaterally), lesion (Hyperpigmented prodominently on thighs) and rash (Hyperpigmented lesions prodominently on thighs) present. Rash is macular and papular. Rash is not scaling.  Neurological:     General: No focal deficit present.     Mental Status: He is alert and oriented to person, place, and time.  Psychiatric:        Mood and Affect: Mood  normal.        Behavior: Behavior normal.      LABORATORY DATA:  I have reviewed the labs as listed.     Latest Ref Rng & Units 10/16/2022    1:46 PM 10/03/2022   12:26 PM 08/03/2022    1:09 PM  CBC  WBC 3.4 - 10.8 x10E3/uL 5.0  4.0  4.8   Hemoglobin 13.0 - 17.7 g/dL 7.8  6.4  9.4   Hematocrit 37.5 - 51.0 % 25.3  20.9  28.9   Platelets 150 - 450 x10E3/uL 332  255  204       Latest Ref Rng & Units 10/03/2022   12:26 PM 08/31/2022    1:00 PM 08/03/2022    1:09 PM  CMP  Glucose 70 - 99 mg/dL 186  144  206   BUN 8 - 23 mg/dL _0 Creatinine 0.61 - 1.24 mg/dL 1.20  1.30  1.02   Sodium 135 - 145 mmol/L 137  138  137   Potassium 3.5 - 5.1 mmol/L 4.6  3.6  3.7   Chloride 98 - 111 mmol/L 104  100  99   CO2 22 - 32 mmol/L 24  25  27   Calcium 8.9 - 10.3 mg/dL 9.1  9.4  9.2   Total Protein 6.5 - 8.1 g/dL 6.6  6.5  6.6   Total Bilirubin 0.3 - 1.2 mg/dL 0.2  0.5  0.9   Alkaline Phos 38 - 126 U/L 53  43  44   AST 15 - 41 U/L _0 ALT 0 - 44 U/L _1 DIAGNOSTIC IMAGING:  I have independently reviewed the scans and discussed with the patient. No results found.   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  He has been on finasteride. - Biopsy on 12/24/2013: Right lateral mid-prostatic adenocarcinoma, Gleason 3+3=6 - Biopsy on 06/25/2020: Prostatic adenocarcinoma, T2b, Gleason 4+3=7, PSA 12.8 (adjusted for finasteride) - Bone scan on 07/06/2020 and CT on 07/29/2020 negative. - 11/09/2020: Insertion of radioactive I-125 seeds - PSA 5.4 (6/22), 53.2 (11/17/2021), 66.7 (11/22/2021), 382 (01/12/2022) - CTAP on 11/25/2021 with no evidence of metastatic disease.  New ill-defined soft tissue adjacent to the pancreatic body encasing the proximal splenic artery, sequela of interval pancreatitis although neoplasm is considered.  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 - Bone  scan on 12/08/2021 with possible uptake in the lower thoracic vertebral body.  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 - CT chest without contrast on 01/03/2022 with no CT correlate in the lower thoracic spine.  No additional metastatic disease. - Degarelix  240 mg by Dr. Jeffie Pollock on 01/12/2022. - PSMA PET scan on 01/30/2022: Multiple skeletal metastasis in the axillary and appendicular skeleton with nearly every vertebral body involved with carcinoma.  Broad lesions in the sacrum and pelvis.  Focal activity in the anterior apical region of the prostate gland.  No metastatic adenopathy or visceral metastasis. [place] 3 tablets daily started around 02/08/2022, dose increased to 4 tablets daily on 03/21/2022. -He was hospitalized from 03/12/2022 through 03/14/2022 with pneumonia.  I have reviewed hospitalization records.  He reportedly fell on the night of 03/14/2022 when he lost balance while hanging his robe. - Erleada dose reduced to 3 tablets daily on 04/10/2022 due to diarrhea and rash.  Discontinued on 10/03/2022 due to progression.  Abiraterone and prednisone started on 10/05/2022   Social/family history: - Lives with wife and granddaughter at home.  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  Quit smoking 50 years ago. - 2 sisters had brain cancer.  Maternal grandmother had cancer.  Niece had breast cancer.   PLAN:  Metastatic CSPC to the bones: -He started taking abiraterone and prednisone on 09/30/2022. - So far he has tolerated it very well.  Denies any GI side effects. - Reviewed labs today which showed normal LFTs.  Potassium was mildly low at 3.2.  Hemoglobin was 8.1.  He will receive oral potassium once today. - Blood pressure is 159/66.  Will closely monitor.  If continues to be high, will increase prednisone dose. - RTC 4 weeks for follow-up.  2.  Bone metastasis: - Continue denosumab.  Calcium today is 9.1.  3.  Atrial fibrillation and history of unprovoked  right leg DVT: - Continue warfarin.  No bleeding issues.  4.  Normocytic anemia: - Hemoglobin today is 8.1.  Last ferritin was 8 on 10/03/2022. - Recommend Feraheme x 2.  We discussed side effects in detail including allergic reactions.  5.  Hypomagnesemia: - Continue magnesium once daily.  Magnesium  is normal today.   Orders placed this encounter:  No orders of the defined types were placed in this encounter.     Derek Jack, MD [place] [phone]

## 2022-10-31 NOTE — Patient Instructions (Addendum)
[place] at [facility] Discharge Instructions   You were seen and examined today by Dr. Delton Coombes.  He reviewed the results of your lab work. Your hemoglobin is low and your iron is low. We will arrange for you to have 2 iron infusions to help get your hemoglobin up.   We will give you the Xgeva shot today and continue that monthly.   Return as scheduled in 4 weeks.      Thank you for choosing [place] at [facility] to provide your oncology and hematology care.  To afford each patient quality time with our provider, please arrive at least 15 minutes before your scheduled appointment time.   If you have a lab appointment with the [facility] please come in thru the Main Entrance and check in at the main information desk.  You need to re-schedule your appointment should you arrive 10 or more minutes late.  We strive to give you quality time with our providers, and arriving late affects you and other patients whose appointments are after yours.  Also, if you no show three or more times for appointments you may be dismissed from the clinic at the providers discretion.     Again, thank you for choosing [facility].  Our hope is that these requests will decrease the amount of time that you wait before being seen by our physicians.       _____________________________________________________________  Should you have questions after your visit to [facility], please contact our office at [phone] and follow the prompts.  Our office hours are 8:00 a.m. and 4:30 p.m. Monday - Friday.  Please note that voicemails left after 4:00 p.m. may not be returned until the following business day.  We are closed weekends and major holidays.  You do have access to a nurse 24-7, just call the main number to the clinic [phone] and do not press any options, hold on the line and a nurse will answer the phone.    For prescription  refill requests, have your pharmacy contact our office and allow 72 hours.    Due to Covid, you will need to wear a mask upon entering the hospital. If you do not have a mask, a mask will be given to you at the Main Entrance upon arrival. For doctor visits, patients may have 1 support person age 4 or older with them. For treatment visits, patients can not have anyone with them due to social distancing guidelines and our immunocompromised population.

## 2022-11-01 ENCOUNTER — Other Ambulatory Visit (HOSPITAL_COMMUNITY): Payer: Self-pay

## 2022-11-06 ENCOUNTER — Encounter (HOSPITAL_COMMUNITY): Payer: Self-pay | Admitting: Hematology

## 2022-11-06 ENCOUNTER — Other Ambulatory Visit (HOSPITAL_COMMUNITY): Payer: Self-pay

## 2022-11-06 ENCOUNTER — Telehealth: Payer: Self-pay

## 2022-11-06 NOTE — Telephone Encounter (Signed)
Oral Oncology Patient Advocate Encounter   Received notification that Patient Assistance [employer] needed Proof of Income for enrollment into their grant. Received and uploaded SSI Award Letter to [facility] Co-Pay Relief.  Will continue to follow and update.  Berdine Addison, [facility] Patient [place]  [phone] (phone) [phone] (fax) 11/06/2022 8:28 AM

## 2022-11-06 NOTE — Telephone Encounter (Addendum)
Oral Oncology Patient Advocate Encounter   Was successful in securing patient a $5,500 grant from Patient [place] (PAF) to provide copayment coverage for Abiraterone.  This will keep the out of pocket expense at $0.     I tried calling the patient at [phone] that is on file for both Mr. Groft and his wife. The number is currently not in service.     The billing information is as follows and has been shared with [place].   RxBin: [id number] PCN:  [id number] Member ID: [id number] Group ID: [id number] Dates of Eligibility: 05/10/22 through 11/06/2023  Berdine Addison, [facility] Patient [place]  [phone] (phone) [phone] (fax) 11/06/2022 3:15 PM

## 2022-11-07 ENCOUNTER — Inpatient Hospital Stay: Payer: No Typology Code available for payment source

## 2022-11-07 VITALS — BP 136/63 | HR 80 | Temp 98.0°F | Resp 18

## 2022-11-07 DIAGNOSIS — C7951 Secondary malignant neoplasm of bone: Secondary | ICD-10-CM

## 2022-11-07 DIAGNOSIS — C61 Malignant neoplasm of prostate: Secondary | ICD-10-CM | POA: Diagnosis not present

## 2022-11-07 MED ORDER — SODIUM CHLORIDE 0.9 % IV SOLN: Freq: Once | INTRAVENOUS | Status: AC

## 2022-11-07 MED ORDER — LORATADINE 10 MG PO TABS
10.0000 mg | ORAL_TABLET | Freq: Once | ORAL | Status: AC
  Administered 2022-11-07: 10 mg via ORAL
  Filled 2022-11-07: qty 1

## 2022-11-07 MED ORDER — SODIUM CHLORIDE 0.9 % IV SOLN
510.0000 mg | Freq: Once | INTRAVENOUS | Status: AC
  Administered 2022-11-07: 510 mg via INTRAVENOUS
  Filled 2022-11-07: qty 17

## 2022-11-07 MED ORDER — ACETAMINOPHEN 325 MG PO TABS
650.0000 mg | ORAL_TABLET | Freq: Once | ORAL | Status: AC
  Administered 2022-11-07: 650 mg via ORAL
  Filled 2022-11-07: qty 2

## 2022-11-07 NOTE — Patient Instructions (Signed)
[name]  Discharge Instructions: Thank you for choosing [facility] to provide your oncology and hematology care.  If you have a lab appointment with the [facility], please come in thru the Main Entrance and check in at the main information desk.  Wear comfortable clothing and clothing appropriate for easy access to any Portacath or PICC line.   We strive to give you quality time with your provider. You may need to reschedule your appointment if you arrive late (15 or more minutes).  Arriving late affects you and other patients whose appointments are after yours.  Also, if you miss three or more appointments without notifying the office, you may be dismissed from the clinic at the provider's discretion.      For prescription refill requests, have your pharmacy contact our office and allow 72 hours for refills to be completed.    Today you received a Feraheme infusion   To help prevent nausea and vomiting after your treatment, we encourage you to take your nausea medication as directed.  BELOW ARE SYMPTOMS THAT SHOULD BE REPORTED IMMEDIATELY: *FEVER GREATER THAN 100.4 F (38 C) OR HIGHER *CHILLS OR SWEATING *NAUSEA AND VOMITING THAT IS NOT CONTROLLED WITH YOUR NAUSEA MEDICATION *UNUSUAL SHORTNESS OF BREATH *UNUSUAL BRUISING OR BLEEDING *URINARY PROBLEMS (pain or burning when urinating, or frequent urination) *BOWEL PROBLEMS (unusual diarrhea, constipation, pain near the anus) TENDERNESS IN MOUTH AND THROAT WITH OR WITHOUT PRESENCE OF ULCERS (sore throat, sores in mouth, or a toothache) UNUSUAL RASH, SWELLING OR PAIN  UNUSUAL VAGINAL DISCHARGE OR ITCHING   Items with * indicate a potential emergency and should be followed up as soon as possible or go to the Emergency Department if any problems should occur.  Please show the CHEMOTHERAPY ALERT CARD or IMMUNOTHERAPY ALERT CARD at check-in to the Emergency Department and triage nurse.  Should you  have questions after your visit or need to cancel or reschedule your appointment, please contact [facility] [phone]  and follow the prompts.  Office hours are 8:00 a.m. to 4:30 p.m. Monday - Friday. Please note that voicemails left after 4:00 p.m. may not be returned until the following business day.  We are closed weekends and major holidays. You have access to a nurse at all times for urgent questions. Please call the main number to the clinic [phone] and follow the prompts.  For any non-urgent questions, you may also contact your provider using MyChart. We now offer e-Visits for anyone 53 and older to request care online for non-urgent symptoms. For details visit [email or web address].   Also download the MyChart app! Go to the app store, search "MyChart", open the app, select [facility], and log in with your MyChart username and password.  Masks are optional in the cancer centers. If you would like for your care team to wear a mask while they are taking care of you, please let them know. You may have one support person who is at least 79 years old accompany you for your appointments.

## 2022-11-07 NOTE — Progress Notes (Signed)
Treatment given per orders. Patient tolerated it well without problems. Vitals stable and discharged home from clinic ambulatory. Follow up as scheduled.  

## 2022-11-10 ENCOUNTER — Encounter (HOSPITAL_COMMUNITY): Payer: Self-pay | Admitting: Hematology

## 2022-11-16 ENCOUNTER — Encounter: Payer: Self-pay | Admitting: Family Medicine

## 2022-11-16 ENCOUNTER — Ambulatory Visit (INDEPENDENT_AMBULATORY_CARE_PROVIDER_SITE_OTHER): Payer: No Typology Code available for payment source | Admitting: Family Medicine

## 2022-11-16 VITALS — BP 146/60 | HR 78 | Temp 97.0°F | Ht 64.0 in | Wt 148.0 lb

## 2022-11-16 DIAGNOSIS — Z7901 Long term (current) use of anticoagulants: Secondary | ICD-10-CM | POA: Diagnosis not present

## 2022-11-16 DIAGNOSIS — Z86718 Personal history of other venous thrombosis and embolism: Secondary | ICD-10-CM | POA: Diagnosis not present

## 2022-11-16 DIAGNOSIS — D6859 Other primary thrombophilia: Secondary | ICD-10-CM | POA: Diagnosis not present

## 2022-11-16 LAB — COAGUCHEK XS/INR WAIVED
INR: 4.1 — ABNORMAL HIGH (ref 0.9–1.1)
Prothrombin Time: 49.1 s

## 2022-11-16 NOTE — Progress Notes (Signed)
BP (!) 146/60   Pulse 78   Temp (!) 97 F (36.1 C)   Ht _0  (1.626 m)   Wt 148 lb (67.1 kg)   SpO2 99%   BMI 25.40 kg/m    Subjective:   Patient ID: [name], male    DOB: [birth year], 81 y.o.   MRN: [id number]  HPI: Irish Piech. is a 81 y.o. male presenting on 11/16/2022 for Medical Management of Chronic Issues and Atrial Fibrillation   HPI Coumadin recheck Target goal: 2.0-3.0 Reason on anticoagulation: A-fib with history of thrombus Patient denies any bruising or bleeding or chest pain or palpitations   Relevant past medical, surgical, family and social history reviewed and updated as indicated. Interim medical history since our last visit reviewed. Allergies and medications reviewed and updated.  Review of Systems  Constitutional:  Negative for chills and fever.  Respiratory:  Negative for shortness of breath and wheezing.   Cardiovascular:  Negative for chest pain and leg swelling.  Gastrointestinal:  Negative for blood in stool.  Genitourinary:  Negative for hematuria.  Musculoskeletal:  Negative for back pain and gait problem.  Skin:  Negative for rash.  All other systems reviewed and are negative.   Per HPI unless specifically indicated above   Allergies as of 11/16/2022       Reactions   Allopurinol Other (See Comments)   Significantly decreased WBC's   Colchicine Other (See Comments)   Significantly decreased WBC's        Medication List        Accurate as of November 16, 2022 11:31 AM. If you have any questions, ask your nurse or doctor.          abiraterone acetate 250 MG tablet Commonly known as: ZYTIGA Take 4 tablets (1,000 mg total) by mouth daily. Take on an empty stomach 1 hour before or 2 hours after a meal   acetaminophen 500 MG tablet Commonly known as: TYLENOL Take 1,000 mg by mouth every 6 (six) hours as needed for moderate pain.   amLODipine 10 MG tablet Commonly known as: NORVASC Take 1 tablet (10 mg total) by  mouth every evening.   Blood Pressure Kit 1 each by Does not apply route daily.   CALCIUM 600 + D PO Take 1 tablet by mouth in the morning and at bedtime.   calcium carbonate 1500 (600 Ca) MG Tabs tablet Commonly known as: OSCAL Take by mouth 2 (two) times daily with a meal.   chlorthalidone 25 MG tablet Commonly known as: HYGROTON Take 1 tablet by mouth once daily   diclofenac Sodium 1 % Gel Commonly known as: VOLTAREN Apply 1 application topically 2 (two) times daily as needed (pain).   ferrous sulfate 325 (65 FE) MG tablet Commonly known as: SV Iron Take 1 tablet (325 mg total) by mouth daily with breakfast.   fluticasone 50 MCG/ACT nasal spray Commonly known as: FLONASE Place 1 spray into both nostrils 2 (two) times daily as needed for allergies or rhinitis.   glucose blood test strip Commonly known as: ONE TOUCH ULTRA TEST Use to check BG once daily.  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 Can take every 4 hours if needed.   lisinopril 40 MG tablet Commonly known as: ZESTRIL Take 1 tablet (40 mg total) by mouth daily.   magnesium  oxide 400 (240 Mg) MG tablet Commonly known as: MAG-OX Take 1 tablet (400 mg total) by mouth daily.   metFORMIN 1000 MG tablet Commonly known as: GLUCOPHAGE Take 1 tablet (1,000 mg total) by mouth 2 (two) times daily with a meal.   metoprolol succinate 100 MG 24 hr tablet Commonly known as: TOPROL-XL Take 1 tablet (100 mg total) by mouth daily. Take with or immediately following a meal.   omeprazole 20 MG capsule Commonly known as: PRILOSEC Take 1 capsule (20 mg total) by mouth daily.   ONE TOUCH ULTRA MINI w/Device Kit 1 each by Does not apply route 2 (two) times daily.   potassium chloride SA 20 MEQ tablet Commonly known as: KLOR-CON M TAKE 1  BY MOUTH ONCE DAILY Strength: 20 mEq    pravastatin 40 MG tablet Commonly known as: PRAVACHOL Take 1 tablet (40 mg total) by mouth every evening.   predniSONE 5 MG tablet Commonly known as: DELTASONE Take 1 tablet (5 mg total) by mouth daily with breakfast.   warfarin 10 MG tablet Commonly known as: Coumadin Take as directed by the anticoagulation clinic. If you are unsure how to take this medication, talk to your nurse or doctor. Original instructions: Take 1 tablet (10 mg total) by mouth daily.         Objective:   BP (!) 146/60   Pulse 78   Temp (!) 97 F (36.1 C)   Ht _0  (1.626 m)   Wt 148 lb (67.1 kg)   SpO2 99%   BMI 25.40 kg/m   Wt Readings from Last 3 Encounters:  11/16/22 148 lb (67.1 kg)  10/31/22 145 lb 1 oz (65.8 kg)  10/16/22 142 lb (64.4 kg)    Physical Exam Vitals and nursing note reviewed.  Constitutional:      Appearance: Normal appearance. He is normal weight. He is not ill-appearing.  Skin:    Findings: No bruising.  Neurological:     Mental Status: He is alert and oriented to person, place, and time.     Description   Hold for 1 day and then decrease to take 1/2 tablet or 5 mg every Tuesday and then continue 10 mg on the rest of the week.   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  Counseling provided for all of the vaccine components Orders Placed This Encounter  Procedures   CoaguChek XS/INR Everson Nasif Bos, MD [facility] 11/16/2022, 11:31 AM

## 2022-11-17 ENCOUNTER — Other Ambulatory Visit: Payer: Self-pay

## 2022-11-17 DIAGNOSIS — C7951 Secondary malignant neoplasm of bone: Secondary | ICD-10-CM

## 2022-11-17 DIAGNOSIS — G893 Neoplasm related pain (acute) (chronic): Secondary | ICD-10-CM

## 2022-11-17 MED ORDER — HYDROCODONE-ACETAMINOPHEN 5-325 MG PO TABS: 1.0000 | ORAL_TABLET | Freq: Four times a day (QID) | ORAL | 0 refills | Status: DC | PRN

## 2022-11-22 ENCOUNTER — Other Ambulatory Visit (HOSPITAL_COMMUNITY): Payer: Self-pay

## 2022-11-22 ENCOUNTER — Encounter (HOSPITAL_COMMUNITY): Payer: Self-pay | Admitting: Hematology

## 2022-11-24 ENCOUNTER — Other Ambulatory Visit: Payer: Self-pay

## 2022-11-29 ENCOUNTER — Inpatient Hospital Stay: Payer: No Typology Code available for payment source | Attending: Hematology

## 2022-11-29 ENCOUNTER — Ambulatory Visit (INDEPENDENT_AMBULATORY_CARE_PROVIDER_SITE_OTHER): Payer: No Typology Code available for payment source | Admitting: Urology

## 2022-11-29 ENCOUNTER — Inpatient Hospital Stay (HOSPITAL_BASED_OUTPATIENT_CLINIC_OR_DEPARTMENT_OTHER): Payer: No Typology Code available for payment source | Admitting: Hematology

## 2022-11-29 ENCOUNTER — Telehealth: Payer: Self-pay

## 2022-11-29 ENCOUNTER — Inpatient Hospital Stay: Payer: No Typology Code available for payment source

## 2022-11-29 VITALS — BP 143/59 | HR 99 | Temp 99.8°F | Resp 19 | Ht 64.0 in | Wt 143.0 lb

## 2022-11-29 DIAGNOSIS — Z8249 Family history of ischemic heart disease and other diseases of the circulatory system: Secondary | ICD-10-CM | POA: Insufficient documentation

## 2022-11-29 DIAGNOSIS — D649 Anemia, unspecified: Secondary | ICD-10-CM | POA: Diagnosis not present

## 2022-11-29 DIAGNOSIS — R200 Anesthesia of skin: Secondary | ICD-10-CM | POA: Diagnosis not present

## 2022-11-29 DIAGNOSIS — Z818 Family history of other mental and behavioral disorders: Secondary | ICD-10-CM | POA: Insufficient documentation

## 2022-11-29 DIAGNOSIS — Z803 Family history of malignant neoplasm of breast: Secondary | ICD-10-CM | POA: Insufficient documentation

## 2022-11-29 DIAGNOSIS — Z87891 Personal history of nicotine dependence: Secondary | ICD-10-CM | POA: Diagnosis not present

## 2022-11-29 DIAGNOSIS — C61 Malignant neoplasm of prostate: Secondary | ICD-10-CM

## 2022-11-29 DIAGNOSIS — R0602 Shortness of breath: Secondary | ICD-10-CM | POA: Diagnosis not present

## 2022-11-29 DIAGNOSIS — R059 Cough, unspecified: Secondary | ICD-10-CM | POA: Diagnosis not present

## 2022-11-29 DIAGNOSIS — I10 Essential (primary) hypertension: Secondary | ICD-10-CM | POA: Diagnosis not present

## 2022-11-29 DIAGNOSIS — Z79899 Other long term (current) drug therapy: Secondary | ICD-10-CM | POA: Diagnosis not present

## 2022-11-29 DIAGNOSIS — Z808 Family history of malignant neoplasm of other organs or systems: Secondary | ICD-10-CM | POA: Diagnosis not present

## 2022-11-29 DIAGNOSIS — Z86718 Personal history of other venous thrombosis and embolism: Secondary | ICD-10-CM | POA: Diagnosis not present

## 2022-11-29 DIAGNOSIS — Z86010 Personal history of colonic polyps: Secondary | ICD-10-CM | POA: Insufficient documentation

## 2022-11-29 DIAGNOSIS — I4891 Unspecified atrial fibrillation: Secondary | ICD-10-CM | POA: Diagnosis not present

## 2022-11-29 DIAGNOSIS — R197 Diarrhea, unspecified: Secondary | ICD-10-CM | POA: Diagnosis not present

## 2022-11-29 DIAGNOSIS — Z823 Family history of stroke: Secondary | ICD-10-CM | POA: Insufficient documentation

## 2022-11-29 DIAGNOSIS — C7951 Secondary malignant neoplasm of bone: Secondary | ICD-10-CM | POA: Diagnosis not present

## 2022-11-29 LAB — CBC WITH DIFFERENTIAL/PLATELET
Abs Immature Granulocytes: 0.03 10*3/uL (ref 0.00–0.07)
Basophils Absolute: 0 10*3/uL (ref 0.0–0.1)
Basophils Relative: 0 %
Eosinophils Absolute: 0 10*3/uL (ref 0.0–0.5)
Eosinophils Relative: 0 %
HCT: 25.5 % — ABNORMAL LOW (ref 39.0–52.0)
Hemoglobin: 8.1 g/dL — ABNORMAL LOW (ref 13.0–17.0)
Immature Granulocytes: 1 %
Lymphocytes Relative: 6 %
Lymphs Abs: 0.4 10*3/uL — ABNORMAL LOW (ref 0.7–4.0)
MCH: 29.2 pg (ref 26.0–34.0)
MCHC: 31.8 g/dL (ref 30.0–36.0)
MCV: 92.1 fL (ref 80.0–100.0)
Monocytes Absolute: 0.6 10*3/uL (ref 0.1–1.0)
Monocytes Relative: 11 %
Neutro Abs: 4.8 10*3/uL (ref 1.7–7.7)
Neutrophils Relative %: 82 %
Platelets: 189 10*3/uL (ref 150–400)
RBC: 2.77 MIL/uL — ABNORMAL LOW (ref 4.22–5.81)
RDW: 20.1 % — ABNORMAL HIGH (ref 11.5–15.5)
WBC: 5.8 10*3/uL (ref 4.0–10.5)
nRBC: 0 % (ref 0.0–0.2)

## 2022-11-29 LAB — IRON AND TIBC
Iron: 62 ug/dL (ref 45–182)
Saturation Ratios: 19 % (ref 17.9–39.5)
TIBC: 325 ug/dL (ref 250–450)
UIBC: 263 ug/dL

## 2022-11-29 LAB — COMPREHENSIVE METABOLIC PANEL
ALT: 6 U/L (ref 0–44)
AST: 24 U/L (ref 15–41)
Albumin: 3.5 g/dL (ref 3.5–5.0)
Alkaline Phosphatase: 77 U/L (ref 38–126)
Anion gap: 12 (ref 5–15)
BUN: 18 mg/dL (ref 8–23)
CO2: 24 mmol/L (ref 22–32)
Calcium: 8.9 mg/dL (ref 8.9–10.3)
Chloride: 96 mmol/L — ABNORMAL LOW (ref 98–111)
Creatinine, Ser: 1.05 mg/dL (ref 0.61–1.24)
GFR, Estimated: >60 mL/min (ref >60)
Glucose, Bld: 210 mg/dL — ABNORMAL HIGH (ref 70–99)
Potassium: 3.6 mmol/L (ref 3.5–5.1)
Sodium: 132 mmol/L — ABNORMAL LOW (ref 135–145)
Total Bilirubin: 0.7 mg/dL (ref 0.3–1.2)
Total Protein: 6.7 g/dL (ref 6.5–8.1)

## 2022-11-29 LAB — PSA: Prostatic Specific Antigen: 156 ng/mL — ABNORMAL HIGH (ref 0.00–4.00)

## 2022-11-29 LAB — MAGNESIUM: Magnesium: 1.3 mg/dL — ABNORMAL LOW (ref 1.7–2.4)

## 2022-11-29 LAB — FERRITIN: Ferritin: 95 ng/mL (ref 24–336)

## 2022-11-29 MED ORDER — MAGNESIUM SULFATE 2 GM/50ML IV SOLN
2.0000 g | Freq: Once | INTRAVENOUS | Status: AC
  Administered 2022-11-29: 2 g via INTRAVENOUS
  Filled 2022-11-29: qty 50

## 2022-11-29 MED ORDER — DEGARELIX ACETATE 80 MG ~~LOC~~ SOLR
80.0000 mg | Freq: Once | SUBCUTANEOUS | Status: AC
  Administered 2022-11-29: 80 mg via SUBCUTANEOUS

## 2022-11-29 MED ORDER — SODIUM CHLORIDE 0.9 % IV SOLN: INTRAVENOUS | Status: DC

## 2022-11-29 MED ORDER — DENOSUMAB 120 MG/1.7ML ~~LOC~~ SOLN: 120.0000 mg | Freq: Once | SUBCUTANEOUS | Status: DC

## 2022-11-29 MED ORDER — MAGNESIUM OXIDE -MG SUPPLEMENT 400 (240 MG) MG PO TABS: 400.0000 mg | ORAL_TABLET | Freq: Three times a day (TID) | ORAL | 6 refills | Status: DC

## 2022-11-29 NOTE — Progress Notes (Signed)
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   Medication: Mills Koller (Degarelix)  Dose: '80mg'$  Location: left upper abdomen Lot: [id number] Exp: 10/11/2024 Patient tolerated well, no complications were noted  Performed by: [id number] CMA  Follow up: Keep OV and next firmagon 12/29/22

## 2022-11-29 NOTE — Progress Notes (Signed)
Magnesium infusion  given per orders. Patient tolerated it well without problems. Vitals stable and discharged home from clinic via wheelchair. Follow up as scheduled.

## 2022-11-29 NOTE — Telephone Encounter (Signed)
Patient was seen 11/29/22 and was given Mills Koller '80mg'$ 

## 2022-11-29 NOTE — Patient Instructions (Signed)
Long  Discharge Instructions: Thank you for choosing [facility] to provide your oncology and hematology care.  If you have a lab appointment with the [facility], please come in thru the Main Entrance and check in at the main information desk.  Wear comfortable clothing and clothing appropriate for easy access to any Portacath or PICC line.   We strive to give you quality time with your provider. You may need to reschedule your appointment if you arrive late (15 or more minutes).  Arriving late affects you and other patients whose appointments are after yours.  Also, if you miss three or more appointments without notifying the office, you may be dismissed from the clinic at the provider's discretion.      For prescription refill requests, have your pharmacy contact our office and allow 72 hours for refills to be completed.    Today you received the following, 4 grams of magnesium infusion   To help prevent nausea and vomiting after your treatment, we encourage you to take your nausea medication as directed.  BELOW ARE SYMPTOMS THAT SHOULD BE REPORTED IMMEDIATELY: *FEVER GREATER THAN 100.4 F (38 C) OR HIGHER *CHILLS OR SWEATING *NAUSEA AND VOMITING THAT IS NOT CONTROLLED WITH YOUR NAUSEA MEDICATION *UNUSUAL SHORTNESS OF BREATH *UNUSUAL BRUISING OR BLEEDING *URINARY PROBLEMS (pain or burning when urinating, or frequent urination) *BOWEL PROBLEMS (unusual diarrhea, constipation, pain near the anus) TENDERNESS IN MOUTH AND THROAT WITH OR WITHOUT PRESENCE OF ULCERS (sore throat, sores in mouth, or a toothache) UNUSUAL RASH, SWELLING OR PAIN  UNUSUAL VAGINAL DISCHARGE OR ITCHING   Items with * indicate a potential emergency and should be followed up as soon as possible or go to the Emergency Department if any problems should occur.  Please show the CHEMOTHERAPY ALERT CARD or IMMUNOTHERAPY ALERT CARD at check-in to the Emergency Department and  triage nurse.  Should you have questions after your visit or need to cancel or reschedule your appointment, please contact [facility] [phone]  and follow the prompts.  Office hours are 8:00 a.m. to 4:30 p.m. Monday - Friday. Please note that voicemails left after 4:00 p.m. may not be returned until the following business day.  We are closed weekends and major holidays. You have access to a nurse at all times for urgent questions. Please call the main number to the clinic [phone] and follow the prompts.  For any non-urgent questions, you may also contact your provider using MyChart. We now offer e-Visits for anyone 54 and older to request care online for non-urgent symptoms. For details visit [email or web address].   Also download the MyChart app! Go to the app store, search "MyChart", open the app, select [facility], and log in with your MyChart username and password.  Masks are optional in the cancer centers. If you would like for your care team to wear a mask while they are taking care of you, please let them know. You may have one support person who is at least 79 years old accompany you for your appointments.

## 2022-11-29 NOTE — Progress Notes (Signed)
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  PRIOR THERAPY: Erleada from 02/08/2022 through 10/03/2022  NGS Results: not done  CURRENT THERAPY: Firmagon and Zytiga  BRIEF ONCOLOGIC HISTORY:  Oncology History  Prostate cancer ([place])  04/13/2014 Initial Diagnosis   Prostate cancer ([place])   06/25/2020 Cancer Staging   Staging form: Prostate, AJCC 8th Edition - Clinical stage from 06/25/2020: Stage IIC (cT2b, cN0, cM0, PSA: 12.8, Grade Group: 3) - Signed by Freeman Caldron, PA-C on 09/29/2020     CANCER STAGING:  Cancer Staging  Prostate cancer [facility]) Staging form: Prostate, AJCC 8th Edition - Clinical stage from 06/25/2020: Stage IIC (cT2b, cN0, cM0, PSA: 12.8, Grade Group: 3) - Signed by Freeman Caldron, PA-C on 09/29/2020 - Pathologic stage from 01/26/2022: Stage IVB (pT2, pN0, pM1b, Grade Group: 3) - Unsigned   INTERVAL HISTORY:  [name], a 81 y.o. 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  He is taking Abiraterone and prednisone daily.  However he missed Firmagon injections in October and November.  Reports some decreased appetite.  Received Feraheme on 11/21 and 11/07/2022.  He reported some diarrhea which got worse in the last couple of weeks and is better after he started Imodium.  No new onset pains.  Energy levels are low at 40%.  REVIEW OF SYSTEMS:  Review of Systems  Respiratory:  Positive for cough and shortness of breath.   Gastrointestinal:  Positive for diarrhea.  Genitourinary:  Negative for hematuria.   Neurological:  Positive for numbness (L leg).  Hematological:  Does not bruise/bleed easily.  Psychiatric/Behavioral:  Negative for sleep disturbance.   All other systems reviewed and are negative.   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   found during hospital stay with pancreatitis,  started coumadin;   last Abd CT in epic 07-29-2020  chronic thrombus portal venous   GERD (gastroesophageal reflux disease)    Heart murmur    History of acute pancreatitis    2016   necrotizing pancreatitis   History of adenomatous polyp of colon    History of DVT of lower extremity    History of GI bleed 08/2019   upper gi bleed due to duodenal ulcer   History of gout yrs ago   Hypertension    followed by pcp   Hypertrophic cardiomyopathy ([place])    followed by cardiology   Internal hemorrhoids    Mixed hyperlipidemia    Normocytic anemia    PAF (paroxysmal atrial fibrillation) ([place]) 04/2016   cardiologist-- dr h. Tamala Julian--  event monitor 07-04-2017 epic, NSR/ PAflutter with occasional RVR/  PACs/ PVCs ;  echo 05-16-2017 epic,  moderate LVH with severe asymptomic septal hypertrophy, G1DD, EF 60-65%,  mild AR, mild LAE   Pancreatic pseudocyst    Peyronie's disease    Prostate cancer [facility]) urologist-- dr wrenn/ oncologist-- dr Tammi Klippel   first dx 08/ 2009 Gleason 3+3 active survelliance;  until bx 07/  2021  Stage T2b, Gleason 4+3   Right inguinal hernia    Type 2 diabetes mellitus ([place])    followed by pcp---  (11-03-2020 per pt currently not checking blood sugar due to glucose monitor broken)   Wears glasses    Wears partial dentures    upper and lower   Past Surgical History:  Procedure Laterality Date   BIOPSY  02/09/2022   Procedure: BIOPSY;  Surgeon: Milus Banister, MD;  Location: [facility];  Service: Endoscopy;;   CHOLECYSTECTOMY N/A 11/26/2015   Procedure: LAPAROSCOPIC CHOLECYSTECTOMY;  Surgeon: Aviva Signs, MD;  Location: [facility];   Service: General;  Laterality: N/A;   COLONOSCOPY  last one 09-06-2020  dr stark   CYSTOSCOPY N/A 11/09/2020   Procedure: Erlene Quan;  Surgeon: Irine Seal, MD;  Location: [facility];  Service: Urology;  Laterality: N/A;   ESOPHAGOGASTRODUODENOSCOPY  10-06-2019  _0    ESOPHAGOGASTRODUODENOSCOPY N/A 02/09/2022   Procedure: ESOPHAGOGASTRODUODENOSCOPY (EGD);  Surgeon: Milus Banister, MD;  Location: [facility];  Service: Endoscopy;  Laterality: N/A;   EUS N/A 02/09/2022   Procedure: UPPER ENDOSCOPIC ULTRASOUND (EUS) RADIAL;  Surgeon: Milus Banister, MD;  Location: [facility];  Service: Endoscopy;  Laterality: N/A;   FLEXIBLE BRONCHOSCOPY Bilateral 04/14/2016   Procedure: FLEXIBLE BRONCHOSCOPY;  Surgeon: Sinda Du, MD;  [facility];  Service: Cardiopulmonary;  Laterality: Bilateral;   HEMORRHOID SURGERY  yrs ago   RADIOACTIVE SEED IMPLANT N/A 11/09/2020   Procedure: RADIOACTIVE SEED IMPLANT/BRACHYTHERAPY IMPLANT;  Surgeon: Irine Seal, MD;  Location: [facility];  Service: Urology;  Laterality: N/A;   SPACE OAR INSTILLATION N/A 11/09/2020   Procedure: SPACE OAR INSTILLATION;  Surgeon: Irine Seal, MD;  Location: [facility];  Service: Urology;  Laterality: N/A;   UMBILICAL HERNIA REPAIR N/A 11/26/2015   Procedure: UMBILICAL HERNIORRHAPHY;  Surgeon: Aviva Signs, MD;  Location: [facility];  Service: General;  Laterality: N/A;    SOCIAL HISTORY:  Social History   Socioeconomic History   Marital status: Married    Spouse name: Alan [name]   Number of children: 4   Years of education: Not on file   Highest education level: 10th grade  Occupational History   Occupation: Retired     Fish farm manager: [employer]  Tobacco Use   Smoking status: Former    Years: 5.00    Types: Cigarettes    Quit date: 12/11/1986    Years since quitting: 35.9   Smokeless tobacco: Never  Vaping Use   Vaping Use: Never used  Substance and Sexual Activity    Alcohol use: No   Drug use: Never   Sexual activity: Yes  Other Topics Concern   Not on file  Social History Narrative   Not on file   Social Determinants of Health   Financial Resource Strain: Low Risk  (08/12/2021)   Overall Financial Resource Strain (CARDIA)    Difficulty of Paying Living Expenses: Not hard at all  Food Insecurity: No Food Insecurity (08/12/2021)   Hunger Vital Sign    Worried About Running Out of Food in the Last Year: Never true    [place] in the Last Year: Never true  Transportation Needs: No Transportation Needs (08/12/2021)   PRAPARE - Hydrologist (Medical): No    Lack of Transportation (Non-Medical): No  Physical Activity: Inactive (10/25/2021)   Exercise Vital Sign    Days of Exercise per Week: 0 days  Minutes of Exercise per Session: 0 min  Stress: Stress Concern Present (10/25/2021)   [place]    Feeling of Stress : To some extent  Social Connections: Socially Integrated (08/12/2021)   Social Connection and Isolation Panel [NHANES]    Frequency of Communication with Friends and Family: Three times a week    Frequency of Social Gatherings with Friends and Family: Three times a week    Attends Religious Services: More than 4 times per year    Active Member of Clubs or Organizations: Yes    Attends Archivist Meetings: More than 4 times per year    Marital Status: Married  Human resources officer Violence: Not At Risk (08/12/2021)   Humiliation, Afraid, Rape, and Kick questionnaire    Fear of Current or Ex-Partner: No    Emotionally Abused: No    Physically Abused: No    Sexually Abused: No    FAMILY HISTORY:  Family History  Problem Relation Age of Onset   Stroke Mother    Hypertension Mother    Alzheimer's disease Mother    Hypertension Father    Brain cancer Sister    Breast cancer Sister    Hypertension Sister    Deep vein thrombosis  Brother    Hypertension Brother    Colon cancer Neg Hx    Pancreatic disease Neg Hx    Esophageal cancer Neg Hx    Rectal cancer Neg Hx    Stomach cancer Neg Hx     CURRENT MEDICATIONS:  Current Outpatient Medications  Medication Sig Dispense Refill   abiraterone acetate (ZYTIGA) 250 MG tablet Take 4 tablets (1,000 mg total) by mouth daily. Take on an empty stomach 1 hour before or 2 hours after a meal 120 tablet 3   acetaminophen (TYLENOL) 500 MG tablet Take 1,000 mg by mouth every 6 (six) hours as needed for moderate pain.     amLODipine (NORVASC) 10 MG tablet Take 1 tablet (10 mg total) by mouth every evening. 90 tablet 3   Blood Glucose Monitoring Suppl (ONE TOUCH ULTRA MINI) w/Device KIT 1 each by Does not apply route 2 (two) times daily. 1 kit 1   Blood Pressure KIT 1 each by Does not apply route daily. 1 kit 0   Calcium Carb-Cholecalciferol (CALCIUM 600 + D PO) Take 1 tablet by mouth in the morning and at bedtime.     calcium carbonate (OSCAL) 1500 (600 Ca) MG TABS tablet Take by mouth 2 (two) times daily with a meal.     chlorthalidone (HYGROTON) 25 MG tablet Take 1 tablet by mouth once daily 90 tablet 3   diclofenac Sodium (VOLTAREN) 1 % GEL Apply 1 application topically 2 (two) times daily as needed (pain).     ferrous sulfate (SV IRON) 325 (65 FE) MG tablet Take 1 tablet (325 mg total) by mouth daily with breakfast. 90 tablet 1   fluticasone (FLONASE) 50 MCG/ACT nasal spray Place 1 spray into both nostrils 2 (two) times daily as needed for allergies or rhinitis. 16 g 6   glucose blood (ONE TOUCH ULTRA TEST) test strip Use to check BG once daily.  Dx:  Type 2 DM controlled E11.9 100 each 4   HYDROcodone-acetaminophen (NORCO/VICODIN) 5-325 MG tablet Take 1 tablet by mouth every 6 (six) hours as needed for moderate pain. Can take every 4 hours if needed. 30 tablet 0   lisinopril (ZESTRIL) 40 MG tablet Take 1 tablet (40 mg  total) by mouth daily. 90 tablet 1   metFORMIN (GLUCOPHAGE)  1000 MG tablet Take 1 tablet (1,000 mg total) by mouth 2 (two) times daily with a meal. 180 tablet 3   metoprolol succinate (TOPROL-XL) 100 MG 24 hr tablet Take 1 tablet (100 mg total) by mouth daily. Take with or immediately following a meal. 90 tablet 3   omeprazole (PRILOSEC) 20 MG capsule Take 1 capsule (20 mg total) by mouth daily. 90 capsule 3   potassium chloride SA (KLOR-CON M) 20 MEQ tablet TAKE 1  BY MOUTH ONCE DAILY Strength: 20 mEq 90 tablet 1   pravastatin (PRAVACHOL) 40 MG tablet Take 1 tablet (40 mg total) by mouth every evening. 90 tablet 3   predniSONE (DELTASONE) 5 MG tablet Take 1 tablet (5 mg total) by mouth daily with breakfast. 90 tablet 2   warfarin (COUMADIN) 10 MG tablet Take 1 tablet (10 mg total) by mouth daily. 90 tablet 3   magnesium oxide (MAG-OX) 400 (240 Mg) MG tablet Take 1 tablet (400 mg total) by mouth in the morning, at noon, and at bedtime. 90 tablet 6   No current facility-administered medications for this visit.    ALLERGIES:  Allergies  Allergen Reactions   Allopurinol Other (See Comments)    Significantly decreased WBC's   Colchicine Other (See Comments)    Significantly decreased WBC's    PHYSICAL EXAM:  Performance status (ECOG): 1 - Symptomatic but completely ambulatory  Vitals:   11/29/22 1152  BP: (!) 143/59  Pulse: 99  Resp: 19  Temp: 99.8 F (37.7 C)  SpO2: 100%    Wt Readings from Last 3 Encounters:  11/29/22 143 lb (64.9 kg)  11/16/22 148 lb (67.1 kg)  10/31/22 145 lb 1 oz (65.8 kg)   Physical Exam Vitals reviewed.  Constitutional:      Appearance: Normal appearance.     Comments: In wheelchair  Cardiovascular:     Rate and Rhythm: Normal rate and regular rhythm.     Pulses: Normal pulses.     Heart sounds: Normal heart sounds.  Pulmonary:     Effort: Pulmonary effort is normal.     Breath sounds: Normal breath sounds.  Skin:    Findings: Erythema (lesions on thighs bilaterally), lesion (Hyperpigmented  prodominently on thighs) and rash (Hyperpigmented lesions prodominently on thighs) present. Rash is macular and papular. Rash is not scaling.  Neurological:     General: No focal deficit present.     Mental Status: He is alert and oriented to person, place, and time.  Psychiatric:        Mood and Affect: Mood normal.        Behavior: Behavior normal.      LABORATORY DATA:  I have reviewed the labs as listed.     Latest Ref Rng & Units 11/29/2022   10:19 AM 10/31/2022   11:10 AM 10/16/2022    1:46 PM  CBC  WBC 4.0 - 10.5 K/uL 5.8  4.4  5.0   Hemoglobin 13.0 - 17.0 g/dL 8.1  8.1  7.8   Hematocrit 39.0 - 52.0 % 25.5  26.7  25.3   Platelets 150 - 400 K/uL 189  234  332       Latest Ref Rng & Units 11/29/2022   10:19 AM 10/31/2022   11:10 AM 10/03/2022   12:26 PM  CMP  Glucose 70 - 99 mg/dL 210  165  186   BUN 8 - 23 mg/dL 18  18  18   Creatinine 0.61 - 1.24 mg/dL 1.05  1.09  1.20   Sodium 135 - 145 mmol/L 132  137  137   Potassium 3.5 - 5.1 mmol/L 3.6  3.2  4.6   Chloride 98 - 111 mmol/L 96  101  104   CO2 22 - 32 mmol/L _0 Calcium 8.9 - 10.3 mg/dL 8.9  9.1  9.1   Total Protein 6.5 - 8.1 g/dL 6.7  6.9  6.6   Total Bilirubin 0.3 - 1.2 mg/dL 0.7  0.5  0.2   Alkaline Phos 38 - 126 U/L 77  59  53   AST 15 - 41 U/L _1 ALT 0 - 44 U/L _2 DIAGNOSTIC IMAGING:  I have independently reviewed the scans and discussed with the patient. No results found.   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  He has been on finasteride. - Biopsy on 12/24/2013: Right lateral mid-prostatic adenocarcinoma, Gleason 3+3=6 - Biopsy on 06/25/2020: Prostatic adenocarcinoma, T2b, Gleason 4+3=7, PSA 12.8 (adjusted for finasteride) - Bone scan on 07/06/2020 and CT on 07/29/2020 negative. - 11/09/2020: Insertion of radioactive I-125 seeds - PSA 5.4 (6/22), 53.2 (11/17/2021), 66.7 (11/22/2021), 382  (01/12/2022) - CTAP on 11/25/2021 with no evidence of metastatic disease.  New ill-defined soft tissue adjacent to the pancreatic body encasing the proximal splenic artery, sequela of interval pancreatitis although neoplasm is considered.  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 - Bone scan on 12/08/2021 with possible uptake in the lower thoracic vertebral body.  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 - CT chest without contrast on 01/03/2022 with no CT correlate in the lower thoracic spine.  No additional metastatic disease. - Degarelix  240 mg by Dr. Jeffie Pollock on 01/12/2022. - PSMA PET scan on 01/30/2022: Multiple skeletal metastasis in the axillary and appendicular skeleton with nearly every vertebral body involved with carcinoma.  Broad lesions in the sacrum and pelvis.  Focal activity in the anterior apical region of the prostate gland.  No metastatic adenopathy or visceral metastasis. [place] 3 tablets daily started around 02/08/2022, dose increased to 4 tablets daily on 03/21/2022. -He was hospitalized from 03/12/2022 through 03/14/2022 with pneumonia.  I have reviewed hospitalization records.  He reportedly fell on the night of 03/14/2022 when he lost balance while hanging his robe. - Erleada dose reduced to 3 tablets daily on 04/10/2022 due to diarrhea and rash.  Discontinued on 10/03/2022 due to progression.  Abiraterone and prednisone started on 10/05/2022   Social/family history: - Lives with wife and granddaughter at home.  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  Quit smoking 50 years ago. - 2 sisters had brain cancer.  Maternal grandmother had cancer.  Niece had breast cancer.   PLAN:  Metastatic CSPC to the bones: - Abiraterone and prednisone was started on 09/30/2022. - Last dose of Firmagon was on 08/31/2022.  He missed last 2 doses. - PSA today has increased to 156 from 84.  Likely due to not receiving Firmagon.  We have called Dr.  Jethro Poling office and arrange for the [facility] injection 1.  Continue Abiraterone and prednisone. - RTC 4 weeks for follow-up.  2.  Bone metastasis: - He reports some problems in the lower jaw teeth.  No breakdown of the gums.  We will hold denosumab until  he sees the dentist.  3.  Atrial fibrillation and history of unprovoked right leg DVT: - Continue warfarin.  No bleeding issues.  4.  Normocytic anemia: - He received Feraheme on 10/31/2022 and 11/07/2022.  However hemoglobin is 8.1 today.  Creatinine is 1.05.  I have repeated the ferritin and iron panel today.  5.  Hypomagnesemia: - He was taking magnesium once daily.  Magnesium is low at 1.3, most likely from diarrhea. - Will increase magnesium at home to 3 times daily.  He will receive IV magnesium 4 g.   Orders placed this encounter:  Orders Placed This Encounter  Procedures   Iron and TIBC ([facility] ONLY)   Ferritin       Derek Jack, MD [place] [phone]

## 2022-11-29 NOTE — Telephone Encounter (Signed)
Patient's wife called regarding shots patient was supposed to have Oct & Nov.  They were missed. Hospital doctor wanted pt to come in to have shot given asap.  Please advise.  Call back:  Arville Go  [phone]  Thanks, Helene Kelp

## 2022-12-01 ENCOUNTER — Encounter: Payer: Self-pay | Admitting: Family Medicine

## 2022-12-01 ENCOUNTER — Ambulatory Visit (INDEPENDENT_AMBULATORY_CARE_PROVIDER_SITE_OTHER): Payer: No Typology Code available for payment source | Admitting: Family Medicine

## 2022-12-01 VITALS — BP 150/67 | HR 86 | Temp 96.8°F | Ht 64.0 in | Wt 144.0 lb

## 2022-12-01 DIAGNOSIS — Z86718 Personal history of other venous thrombosis and embolism: Secondary | ICD-10-CM | POA: Diagnosis not present

## 2022-12-01 DIAGNOSIS — D6859 Other primary thrombophilia: Secondary | ICD-10-CM

## 2022-12-01 DIAGNOSIS — Z7901 Long term (current) use of anticoagulants: Secondary | ICD-10-CM | POA: Diagnosis not present

## 2022-12-01 NOTE — Progress Notes (Signed)
BP (!) 150/67   Pulse 86   Temp (!) 96.8 F (36 C) (Temporal)   Ht _0  (1.626 m)   Wt 144 lb (65.3 kg)   SpO2 100%   BMI 24.72 kg/m    Subjective:   Patient ID: Alan [name]., male    DOB: [birth year], 81 y.o.   MRN: [id number]  HPI: Alan [name]. is a 81 y.o. male presenting on 12/01/2022 for Coagulation Disorder   HPI Coumadin recheck Target goal: 2.0-3.0 Reason on anticoagulation: History of DVT and A-fib Patient denies any bruising or bleeding or chest pain or palpitations   Relevant past medical, surgical, family and social history reviewed and updated as indicated. Interim medical history since our last visit reviewed. Allergies and medications reviewed and updated.  Review of Systems  Constitutional:  Negative for chills and fever.  Respiratory:  Negative for shortness of breath and wheezing.   Cardiovascular:  Negative for chest pain and leg swelling.  Gastrointestinal:  Negative for blood in stool.  Genitourinary:  Negative for hematuria.  Musculoskeletal:  Negative for back pain and gait problem.  Skin:  Negative for rash.  All other systems reviewed and are negative.   Per HPI unless specifically indicated above   Allergies as of 12/01/2022       Reactions   Allopurinol Other (See Comments)   Significantly decreased WBC's   Colchicine Other (See Comments)   Significantly decreased WBC's        Medication List        Accurate as of December 01, 2022  4:46 PM. If you have any questions, ask your nurse or doctor.          abiraterone acetate 250 MG tablet Commonly known as: ZYTIGA Take 4 tablets (1,000 mg total) by mouth daily. Take on an empty stomach 1 hour before or 2 hours after a meal   acetaminophen 500 MG tablet Commonly known as: TYLENOL Take 1,000 mg by mouth every 6 (six) hours as needed for moderate pain.   amLODipine 10 MG tablet Commonly known as: NORVASC Take 1 tablet (10 mg total) by mouth every evening.   Blood  Pressure Kit 1 each by Does not apply route daily.   CALCIUM 600 + D PO Take 1 tablet by mouth in the morning and at bedtime.   calcium carbonate 1500 (600 Ca) MG Tabs tablet Commonly known as: OSCAL Take by mouth 2 (two) times daily with a meal.   chlorthalidone 25 MG tablet Commonly known as: HYGROTON Take 1 tablet by mouth once daily   diclofenac Sodium 1 % Gel Commonly known as: VOLTAREN Apply 1 application topically 2 (two) times daily as needed (pain).   ferrous sulfate 325 (65 FE) MG tablet Commonly known as: SV Iron Take 1 tablet (325 mg total) by mouth daily with breakfast.   fluticasone 50 MCG/ACT nasal spray Commonly known as: FLONASE Place 1 spray into both nostrils 2 (two) times daily as needed for allergies or rhinitis.   glucose blood test strip Commonly known as: ONE TOUCH ULTRA TEST Use to check BG once daily.  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 Can take every 4 hours if needed.   lisinopril 40 MG tablet Commonly known as: ZESTRIL Take 1 tablet (40 mg total) by mouth daily.   magnesium oxide 400 (240 Mg)  MG tablet Commonly known as: MAG-OX Take 1 tablet (400 mg total) by mouth in the morning, at noon, and at bedtime.   metFORMIN 1000 MG tablet Commonly known as: GLUCOPHAGE Take 1 tablet (1,000 mg total) by mouth 2 (two) times daily with a meal.   metoprolol succinate 100 MG 24 hr tablet Commonly known as: TOPROL-XL Take 1 tablet (100 mg total) by mouth daily. Take with or immediately following a meal.   omeprazole 20 MG capsule Commonly known as: PRILOSEC Take 1 capsule (20 mg total) by mouth daily.   ONE TOUCH ULTRA MINI w/Device Kit 1 each by Does not apply route 2 (two) times daily.   potassium chloride SA 20 MEQ tablet Commonly known as: KLOR-CON M TAKE 1  BY MOUTH ONCE DAILY Strength: 20  mEq   pravastatin 40 MG tablet Commonly known as: PRAVACHOL Take 1 tablet (40 mg total) by mouth every evening.   predniSONE 5 MG tablet Commonly known as: DELTASONE Take 1 tablet (5 mg total) by mouth daily with breakfast.   warfarin 10 MG tablet Commonly known as: Coumadin Take as directed by the anticoagulation clinic. If you are unsure how to take this medication, talk to your nurse or doctor. Original instructions: Take 1 tablet (10 mg total) by mouth daily.         Objective:   BP (!) 150/67   Pulse 86   Temp (!) 96.8 F (36 C) (Temporal)   Ht _0  (1.626 m)   Wt 144 lb (65.3 kg)   SpO2 100%   BMI 24.72 kg/m   Wt Readings from Last 3 Encounters:  12/01/22 144 lb (65.3 kg)  11/29/22 143 lb (64.9 kg)  11/16/22 148 lb (67.1 kg)    Physical Exam Vitals and nursing note reviewed.  Constitutional:      Appearance: Normal appearance.  Neurological:     Mental Status: He is alert.       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   INR was 2.3 (goal 2-3)   Follow-up 6-8 weeks     Follow up plan: Return if symptoms worsen or fail to improve, for 6 to 8-week INR recheck.  Counseling provided for all of the vaccine components Orders Placed This Encounter  Procedures   CoaguChek XS/INR [place] Alan Tuckerman, MD [facility] 12/01/2022, 4:46 PM

## 2022-12-05 LAB — COAGUCHEK XS/INR WAIVED
INR: 2.3 — ABNORMAL HIGH (ref 0.9–1.1)
Prothrombin Time: 27.2 s

## 2022-12-07 ENCOUNTER — Ambulatory Visit: Payer: No Typology Code available for payment source | Admitting: Urology

## 2022-12-19 ENCOUNTER — Other Ambulatory Visit (HOSPITAL_COMMUNITY): Payer: Self-pay

## 2022-12-20 ENCOUNTER — Other Ambulatory Visit (HOSPITAL_COMMUNITY): Payer: Self-pay

## 2022-12-20 ENCOUNTER — Encounter (HOSPITAL_COMMUNITY): Payer: Self-pay | Admitting: Hematology

## 2022-12-20 ENCOUNTER — Telehealth: Payer: Self-pay

## 2022-12-20 NOTE — Telephone Encounter (Signed)
Oral Oncology Patient Advocate Encounter   Was successful in securing patient a $4,250 grant from [employer] to provide copayment coverage for Abiraterone.  This will keep the out of pocket expense at $0.     I have spoken with the patient.    The billing information is as follows and has been shared with [place].   Member ID: [id number] Group ID: [id number] RxBin: [id number] PCN: [id number] Dates of Eligibility: 12/18/22 through 12/19/23  Fund name:  Prostate Cancer.   Berdine Addison, [facility] Patient [place]  [phone] (phone) [phone] (fax) 12/20/2022 2:24 PM

## 2022-12-26 ENCOUNTER — Inpatient Hospital Stay: Payer: No Typology Code available for payment source | Attending: Hematology

## 2022-12-26 DIAGNOSIS — R200 Anesthesia of skin: Secondary | ICD-10-CM | POA: Insufficient documentation

## 2022-12-26 DIAGNOSIS — R5383 Other fatigue: Secondary | ICD-10-CM | POA: Diagnosis not present

## 2022-12-26 DIAGNOSIS — Z86010 Personal history of colonic polyps: Secondary | ICD-10-CM | POA: Insufficient documentation

## 2022-12-26 DIAGNOSIS — C61 Malignant neoplasm of prostate: Secondary | ICD-10-CM | POA: Insufficient documentation

## 2022-12-26 DIAGNOSIS — E785 Hyperlipidemia, unspecified: Secondary | ICD-10-CM | POA: Insufficient documentation

## 2022-12-26 DIAGNOSIS — Z8719 Personal history of other diseases of the digestive system: Secondary | ICD-10-CM | POA: Insufficient documentation

## 2022-12-26 DIAGNOSIS — Z86718 Personal history of other venous thrombosis and embolism: Secondary | ICD-10-CM | POA: Insufficient documentation

## 2022-12-26 DIAGNOSIS — I480 Paroxysmal atrial fibrillation: Secondary | ICD-10-CM | POA: Insufficient documentation

## 2022-12-26 DIAGNOSIS — Z8701 Personal history of pneumonia (recurrent): Secondary | ICD-10-CM | POA: Insufficient documentation

## 2022-12-26 DIAGNOSIS — Z87891 Personal history of nicotine dependence: Secondary | ICD-10-CM | POA: Insufficient documentation

## 2022-12-26 DIAGNOSIS — M533 Sacrococcygeal disorders, not elsewhere classified: Secondary | ICD-10-CM | POA: Diagnosis not present

## 2022-12-26 DIAGNOSIS — I10 Essential (primary) hypertension: Secondary | ICD-10-CM | POA: Diagnosis not present

## 2022-12-26 DIAGNOSIS — R0609 Other forms of dyspnea: Secondary | ICD-10-CM | POA: Insufficient documentation

## 2022-12-26 DIAGNOSIS — R0602 Shortness of breath: Secondary | ICD-10-CM | POA: Insufficient documentation

## 2022-12-26 DIAGNOSIS — C7951 Secondary malignant neoplasm of bone: Secondary | ICD-10-CM | POA: Diagnosis not present

## 2022-12-26 DIAGNOSIS — Z79899 Other long term (current) drug therapy: Secondary | ICD-10-CM | POA: Insufficient documentation

## 2022-12-26 DIAGNOSIS — K861 Other chronic pancreatitis: Secondary | ICD-10-CM | POA: Diagnosis not present

## 2022-12-26 DIAGNOSIS — Z8249 Family history of ischemic heart disease and other diseases of the circulatory system: Secondary | ICD-10-CM | POA: Insufficient documentation

## 2022-12-26 DIAGNOSIS — Z9049 Acquired absence of other specified parts of digestive tract: Secondary | ICD-10-CM | POA: Insufficient documentation

## 2022-12-26 DIAGNOSIS — I422 Other hypertrophic cardiomyopathy: Secondary | ICD-10-CM | POA: Insufficient documentation

## 2022-12-26 DIAGNOSIS — Z803 Family history of malignant neoplasm of breast: Secondary | ICD-10-CM | POA: Insufficient documentation

## 2022-12-26 DIAGNOSIS — D649 Anemia, unspecified: Secondary | ICD-10-CM | POA: Diagnosis not present

## 2022-12-26 DIAGNOSIS — K219 Gastro-esophageal reflux disease without esophagitis: Secondary | ICD-10-CM | POA: Diagnosis not present

## 2022-12-26 DIAGNOSIS — E119 Type 2 diabetes mellitus without complications: Secondary | ICD-10-CM | POA: Insufficient documentation

## 2022-12-26 DIAGNOSIS — Z808 Family history of malignant neoplasm of other organs or systems: Secondary | ICD-10-CM | POA: Insufficient documentation

## 2022-12-26 DIAGNOSIS — Z823 Family history of stroke: Secondary | ICD-10-CM | POA: Insufficient documentation

## 2022-12-26 DIAGNOSIS — Z7901 Long term (current) use of anticoagulants: Secondary | ICD-10-CM | POA: Insufficient documentation

## 2022-12-26 DIAGNOSIS — Z818 Family history of other mental and behavioral disorders: Secondary | ICD-10-CM | POA: Insufficient documentation

## 2022-12-26 LAB — CBC WITH DIFFERENTIAL/PLATELET
Abs Immature Granulocytes: 0.04 10*3/uL (ref 0.00–0.07)
Basophils Absolute: 0 10*3/uL (ref 0.0–0.1)
Basophils Relative: 0 %
Eosinophils Absolute: 0 10*3/uL (ref 0.0–0.5)
Eosinophils Relative: 0 %
HCT: 20.5 % — ABNORMAL LOW (ref 39.0–52.0)
Hemoglobin: 6.2 g/dL — CL (ref 13.0–17.0)
Immature Granulocytes: 1 %
Lymphocytes Relative: 10 %
Lymphs Abs: 0.5 10*3/uL — ABNORMAL LOW (ref 0.7–4.0)
MCH: 28.7 pg (ref 26.0–34.0)
MCHC: 30.2 g/dL (ref 30.0–36.0)
MCV: 94.9 fL (ref 80.0–100.0)
Monocytes Absolute: 0.5 10*3/uL (ref 0.1–1.0)
Monocytes Relative: 10 %
Neutro Abs: 3.9 10*3/uL (ref 1.7–7.7)
Neutrophils Relative %: 79 %
Platelets: 213 10*3/uL (ref 150–400)
RBC: 2.16 MIL/uL — ABNORMAL LOW (ref 4.22–5.81)
RDW: 18.1 % — ABNORMAL HIGH (ref 11.5–15.5)
WBC: 5.1 10*3/uL (ref 4.0–10.5)
nRBC: 0 % (ref 0.0–0.2)

## 2022-12-26 LAB — PSA: Prostatic Specific Antigen: 281 ng/mL — ABNORMAL HIGH (ref 0.00–4.00)

## 2022-12-26 LAB — COMPREHENSIVE METABOLIC PANEL
ALT: 16 U/L (ref 0–44)
AST: 36 U/L (ref 15–41)
Albumin: 3 g/dL — ABNORMAL LOW (ref 3.5–5.0)
Alkaline Phosphatase: 100 U/L (ref 38–126)
Anion gap: 15 (ref 5–15)
BUN: 41 mg/dL — ABNORMAL HIGH (ref 8–23)
CO2: 20 mmol/L — ABNORMAL LOW (ref 22–32)
Calcium: 9.2 mg/dL (ref 8.9–10.3)
Chloride: 98 mmol/L (ref 98–111)
Creatinine, Ser: 1.28 mg/dL — ABNORMAL HIGH (ref 0.61–1.24)
GFR, Estimated: 57 mL/min — ABNORMAL LOW (ref >60)
Glucose, Bld: 236 mg/dL — ABNORMAL HIGH (ref 70–99)
Potassium: 2.9 mmol/L — ABNORMAL LOW (ref 3.5–5.1)
Sodium: 133 mmol/L — ABNORMAL LOW (ref 135–145)
Total Bilirubin: 0.5 mg/dL (ref 0.3–1.2)
Total Protein: 6.2 g/dL — ABNORMAL LOW (ref 6.5–8.1)

## 2022-12-26 LAB — MAGNESIUM: Magnesium: 2 mg/dL (ref 1.7–2.4)

## 2022-12-26 NOTE — Progress Notes (Unsigned)
CRITICAL VALUE ALERT Critical value received:  hgb 6.2 Date of notification:  12-27-22 Time of notification: 1352 Critical value read back:  Yes.   Nurse who received alert:  Forest Gleason RN MD notified time and response:  Dr. Freddy Jaksch.  Will give one unit of blood tomorrow per MD.   Alan [name] patient to let him know about his new appointments for tomorrow. Wife understood.

## 2022-12-27 ENCOUNTER — Inpatient Hospital Stay: Payer: No Typology Code available for payment source

## 2022-12-27 ENCOUNTER — Inpatient Hospital Stay: Payer: No Typology Code available for payment source | Admitting: Hematology

## 2022-12-27 ENCOUNTER — Inpatient Hospital Stay (HOSPITAL_BASED_OUTPATIENT_CLINIC_OR_DEPARTMENT_OTHER): Payer: No Typology Code available for payment source | Admitting: Hematology

## 2022-12-27 ENCOUNTER — Encounter: Payer: Self-pay | Admitting: Hematology

## 2022-12-27 VITALS — Wt 145.0 lb

## 2022-12-27 VITALS — BP 134/64 | HR 87 | Temp 97.9°F | Resp 18

## 2022-12-27 DIAGNOSIS — C61 Malignant neoplasm of prostate: Secondary | ICD-10-CM

## 2022-12-27 DIAGNOSIS — C7951 Secondary malignant neoplasm of bone: Secondary | ICD-10-CM | POA: Diagnosis not present

## 2022-12-27 DIAGNOSIS — D649 Anemia, unspecified: Secondary | ICD-10-CM

## 2022-12-27 LAB — PREPARE RBC (CROSSMATCH)

## 2022-12-27 MED ORDER — ACETAMINOPHEN 325 MG PO TABS
650.0000 mg | ORAL_TABLET | Freq: Once | ORAL | Status: AC
  Administered 2022-12-27: 650 mg via ORAL
  Filled 2022-12-27: qty 2

## 2022-12-27 MED ORDER — SODIUM CHLORIDE 0.9% IV SOLUTION
250.0000 mL | Freq: Once | INTRAVENOUS | Status: AC
  Administered 2022-12-27: 250 mL via INTRAVENOUS

## 2022-12-27 MED ORDER — DIPHENHYDRAMINE HCL 25 MG PO CAPS
25.0000 mg | ORAL_CAPSULE | Freq: Once | ORAL | Status: AC
  Administered 2022-12-27: 25 mg via ORAL
  Filled 2022-12-27: qty 1

## 2022-12-27 MED ORDER — DENOSUMAB 120 MG/1.7ML ~~LOC~~ SOLN
120.0000 mg | Freq: Once | SUBCUTANEOUS | Status: AC
  Administered 2022-12-27: 120 mg via SUBCUTANEOUS
  Filled 2022-12-27: qty 1.7

## 2022-12-27 NOTE — Progress Notes (Signed)
Patient presents today for 1 unit of blood and Xgeva injecion. Vital signs stable. Patient has no complaints of any changes since his last F/U visit. Patient denies any jaw pain or upcoming dental work. Calcium 9.2 today. Message received from Sylvester Harder RN / Dr. Erick Blinks to give Alan [name] injection today. Hemoccult cards given to patient's wife and directions. Understanding verbalized by his wife and the patient.   1 unit of blood given today per MD orders. Tolerated infusion without adverse affects. Vital signs stable. No complaints at this time. Discharged from clinic ambulatory in stable condition. Alert and oriented x 3. F/U with [facility] as scheduled.    Alan [name]. presents today for injection per the provider's orders. Xgeva administration without incident; injection site WNL; [name] MAR for injection details.  Patient tolerated procedure well and without incident.  No questions or complaints noted at this time.

## 2022-12-27 NOTE — Progress Notes (Signed)
[name] presents today for injection per the provider's orders.  Xgeva administration without incident; injection site WNL; see MAR for injection details.  Patient tolerated procedure well and without incident.  No questions or complaints noted at this time.

## 2022-12-27 NOTE — Progress Notes (Signed)
Patient is taking Zytiga as prescribed. He has not missed any doses and reports no side effects at this time.   Patient has been assessed, vital signs and labs have been reviewed by Dr. Delton Coombes. ANC, Creatinine, LFTs, and Platelets are within treatment parameters per Dr. Delton Coombes. The patient is good to proceed with treatment at this time. Patient to receive 1 unit PRBCs today also. Primary RN and pharmacy aware.

## 2022-12-27 NOTE — Progress Notes (Signed)
Dental cleared for Xgeva.  T.O. Dr Rhys Martini, PharmD

## 2022-12-27 NOTE — Patient Instructions (Addendum)
[facility]  Discharge Instructions  You were seen and examined today by Dr. Delton Coombes.  Your hemoglobin dropped again, and dark stools can indicate that you are losing blood in your stool. You will get blood today. You will also get Xgeva today and every 4 weeks.  Continue Zytiga as prescribed.  Dr. 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  Dr. Delton Coombes has recommended checking your stool for blood by doing 3 stool cards.  Follow-up as scheduled.  Thank you for choosing [facility] to provide your oncology and hematology care.   To afford each patient quality time with our provider, please arrive at least 15 minutes before your scheduled appointment time. You may need to reschedule your appointment if you arrive late (10 or more minutes). Arriving late affects you and other patients whose appointments are after yours.  Also, if you miss three or more appointments without notifying the office, you may be dismissed from the clinic at the provider's discretion.    Again, thank you for choosing [facility].  Our hope is that these requests will decrease the amount of time that you wait before being seen by our physicians.   If you have a lab appointment with the [facility] please come in thru the Main Entrance and check in at the main information desk.           _____________________________________________________________  Should you have questions after your visit to [facility], please contact our office at [phone] and follow the prompts.  Our office hours are 8:00 a.m. to 4:30 p.m. Monday - Thursday and 8:00 a.m. to 2:30 p.m. Friday.  Please note that voicemails left after 4:00 p.m. may not be returned until the following business day.  We are closed weekends and all major holidays.  You do have access to a nurse 24-7,  just call the main number to the clinic [phone] and do not press any options, hold on the line and a nurse will answer the phone.    For prescription refill requests, have your pharmacy contact our office and allow 72 hours.    Masks are optional in the cancer centers. If you would like for your care team to wear a mask while they are taking care of you, please let them know. You may have one support person who is at least 80 years old accompany you for your appointments.

## 2022-12-27 NOTE — Patient Instructions (Signed)
[facility]  Discharge Instructions: Thank you for choosing [facility] to provide your oncology and hematology care.  If you have a lab appointment with the [facility], please come in thru the Main Entrance and check in at the main information desk.  Wear comfortable clothing and clothing appropriate for easy access to any Portacath or PICC line.   We strive to give you quality time with your provider. You may need to reschedule your appointment if you arrive late (15 or more minutes).  Arriving late affects you and other patients whose appointments are after yours.  Also, if you miss three or more appointments without notifying the office, you may be dismissed from the clinic at the provider's discretion.      For prescription refill requests, have your pharmacy contact our office and allow 72 hours for refills to be completed.    Today you received the following chemotherapy and/or immunotherapy agents 1 unit of blood and Xgeva injection. Denosumab Injection (Oncology) What is this medication? DENOSUMAB (den oh SUE mab) prevents weakened bones caused by cancer. It may also be used to treat noncancerous bone tumors that cannot be removed by surgery. It can also be used to treat high calcium levels in the blood caused by cancer. It works by blocking a protein that causes bones to break down quickly. This slows down the release of calcium from bones, which lowers calcium levels in your blood. It also makes your bones stronger and less likely to break (fracture). This medicine may be used for other purposes; ask your health care provider or pharmacist if you have questions. COMMON BRAND NAME(S): XGEVA What should I tell my care team before I take this medication? They need to know if you have any of these conditions: Dental disease Having surgery or tooth extraction Infection Kidney disease Low levels of calcium or vitamin D in the blood Malnutrition On  hemodialysis Skin conditions or sensitivity Thyroid or parathyroid disease An unusual reaction to denosumab, other medications, foods, dyes, or preservatives Pregnant or trying to get pregnant Breast-feeding How should I use this medication? This medication is for injection under the skin. It is given by your care team in a hospital or clinic setting. A special MedGuide will be given to you before each treatment. Be sure to read this information carefully each time. Talk to your care team about the use of this medication in children. While it may be prescribed for children as young as 13 years for selected conditions, precautions do apply. Overdosage: If you think you have taken too much of this medicine contact a poison control center or emergency room at once. NOTE: This medicine is only for you. Do not share this medicine with others. What if I miss a dose? Keep appointments for follow-up doses. It is important not to miss your dose. Call your care team if you are unable to keep an appointment. What may interact with this medication? Do not take this medication with any of the following: Other medications containing denosumab This medication may also interact with the following: Medications that lower your chance of fighting infection Steroid medications, such as prednisone or cortisone This list may not describe all possible interactions. Give your health care provider a list of all the medicines, herbs, non-prescription drugs, or dietary supplements you use. Also tell them if you smoke, drink alcohol, or use illegal drugs. Some items may interact with your medicine. What should I watch for while using  this medication? Your condition will be monitored carefully while you are receiving this medication. You may need blood work while taking this medication. This medication may increase your risk of getting an infection. Call your care team for advice if you get a fever, chills, sore throat,  or other symptoms of a cold or flu. Do not treat yourself. Try to avoid being around people who are sick. You should make sure you get enough calcium and vitamin D while you are taking this medication, unless your care team tells you not to. Discuss the foods you eat and the vitamins you take with your care team. Some people who take this medication have severe bone, joint, or muscle pain. This medication may also increase your risk for jaw problems or a broken thigh bone. Tell your care team right away if you have severe pain in your jaw, bones, joints, or muscles. Tell your care team if you have any pain that does not go away or that gets worse. Talk to your care team if you may be pregnant. Serious birth defects can occur if you take this medication during pregnancy and for 5 months after the last dose. You will need a negative pregnancy test before starting this medication. Contraception is recommended while taking this medication and for 5 months after the last dose. Your care team can help you find the option that works for you. What side effects may I notice from receiving this medication? Side effects that you should report to your care team as soon as possible: Allergic reactions--skin rash, itching, hives, swelling of the face, lips, tongue, or throat Bone, joint, or muscle pain Low calcium level--muscle pain or cramps, confusion, tingling, or numbness in the hands or feet Osteonecrosis of the jaw--pain, swelling, or redness in the mouth, numbness of the jaw, poor healing after dental work, unusual discharge from the mouth, visible bones in the mouth Side effects that usually do not require medical attention (report to your care team if they continue or are bothersome): Cough Diarrhea Fatigue Headache Nausea This list may not describe all possible side effects. Call your doctor for medical advice about side effects. You may report side effects to FDA at 1-800-FDA-1088. Where should I keep  my medication? This medication is given in a hospital or clinic. It will not be stored at home. NOTE: This sheet is a summary. It may not cover all possible information. If you have questions about this medicine, talk to your doctor, pharmacist, or health care provider.  2023 Elsevier/Gold Standard (2022-04-17 00:00:00) Blood Transfusion, Adult, Care After The following information offers guidance on how to care for yourself after your procedure. Your health care provider may also give you more specific instructions. If you have problems or questions, contact your health care provider. What can I expect after the procedure? After the procedure, it is common to have: Bruising and soreness where the IV was inserted. A headache. Follow these instructions at home: IV insertion site care     Follow instructions from your health care provider about how to take care of your IV insertion site. Make sure you: Wash your hands with soap and water for at least 20 seconds before and after you change your bandage (dressing). If soap and water are not available, use hand sanitizer. Change your dressing as told by your health care provider. Check your IV insertion site every day for signs of infection. Check for: Redness, swelling, or pain. Bleeding from the site. Warmth. Pus or  a bad smell. General instructions Take over-the-counter and prescription medicines only as told by your health care provider. Rest as told by your health care provider. Return to your normal activities as told by your health care provider. Keep all follow-up visits. Lab tests may need to be done at certain periods to recheck your blood counts. Contact a health care provider if: You have itching or red, swollen areas of skin (hives). You have a fever or chills. You have pain in the head, back, or chest. You feel anxious or you feel weak after doing your normal activities. You have redness, swelling, warmth, or pain around  the IV insertion site. You have blood coming from the IV insertion site that does not stop with pressure. You have pus or a bad smell coming from your IV insertion site. If you received your blood transfusion in an outpatient setting, you will be told whom to contact to report any reactions. Get help right away if: You have symptoms of a serious allergic or immune system reaction, including: Trouble breathing or shortness of breath. Swelling of the face, feeling flushed, or widespread rash. Dark urine or blood in the urine. Fast heartbeat. These symptoms may be an emergency. Get help right away. Call 911. Do not wait to see if the symptoms will go away. Do not drive yourself to the hospital. Summary Bruising and soreness around the IV insertion site are common. Check your IV insertion site every day for signs of infection. Rest as told by your health care provider. Return to your normal activities as told by your health care provider. Get help right away for symptoms of a serious allergic or immune system reaction to the blood transfusion. This information is not intended to replace advice given to you by your health care provider. Make sure you discuss any questions you have with your health care provider. Document Revised: 02/24/2022 Document Reviewed: 02/24/2022 Elsevier Patient Education  [place].       To help prevent nausea and vomiting after your treatment, we encourage you to take your nausea medication as directed.  BELOW ARE SYMPTOMS THAT SHOULD BE REPORTED IMMEDIATELY: *FEVER GREATER THAN 100.4 F (38 C) OR HIGHER *CHILLS OR SWEATING *NAUSEA AND VOMITING THAT IS NOT CONTROLLED WITH YOUR NAUSEA MEDICATION *UNUSUAL SHORTNESS OF BREATH *UNUSUAL BRUISING OR BLEEDING *URINARY PROBLEMS (pain or burning when urinating, or frequent urination) *BOWEL PROBLEMS (unusual diarrhea, constipation, pain near the anus) TENDERNESS IN MOUTH AND THROAT WITH OR WITHOUT PRESENCE OF  ULCERS (sore throat, sores in mouth, or a toothache) UNUSUAL RASH, SWELLING OR PAIN  UNUSUAL VAGINAL DISCHARGE OR ITCHING   Items with * indicate a potential emergency and should be followed up as soon as possible or go to the Emergency Department if any problems should occur.  Please show the CHEMOTHERAPY ALERT CARD or IMMUNOTHERAPY ALERT CARD at check-in to the Emergency Department and triage nurse.  Should you have questions after your visit or need to cancel or reschedule your appointment, please contact [facility] [phone]  and follow the prompts.  Office hours are 8:00 a.m. to 4:30 p.m. Monday - Friday. Please note that voicemails left after 4:00 p.m. may not be returned until the following business day.  We are closed weekends and major holidays. You have access to a nurse at all times for urgent questions. Please call the main number to the clinic [phone] and follow the prompts.  For any non-urgent questions, you may also contact your  provider using MyChart. We now offer e-Visits for anyone 42 and older to request care online for non-urgent symptoms. For details visit [email or web address].   Also download the MyChart app! Go to the app store, search "MyChart", open the app, select [facility], and log in with your MyChart username and password.

## 2022-12-27 NOTE — Progress Notes (Signed)
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  PRIOR THERAPY: Erleada from 02/08/2022 through 10/03/2022  NGS Results: not done  CURRENT THERAPY: Firmagon and Zytiga  BRIEF ONCOLOGIC HISTORY:  Oncology History  Prostate cancer ([place])  04/13/2014 Initial Diagnosis   Prostate cancer ([place])   06/25/2020 Cancer Staging   Staging form: Prostate, AJCC 8th Edition - Clinical stage from 06/25/2020: Stage IIC (cT2b, cN0, cM0, PSA: 12.8, Grade Group: 3) - Signed by Freeman Caldron, PA-C on 09/29/2020     CANCER STAGING:  Cancer Staging  Prostate cancer [facility]) Staging form: Prostate, AJCC 8th Edition - Clinical stage from 06/25/2020: Stage IIC (cT2b, cN0, cM0, PSA: 12.8, Grade Group: 3) - Signed by Freeman Caldron, PA-C on 09/29/2020 - Pathologic stage from 01/26/2022: Stage IVB (pT2, pN0, pM1b, Grade Group: 3) - Unsigned   INTERVAL HISTORY:  Mr. Michaelanthony Kempton., a 82 y.o. 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  He is taking Abiraterone and prednisone.  He reports energy levels are 25%.  Reports some dyspnea on exertion.  On questioning he tells me that he has melena for the last 2 to 3 weeks and decreased energy levels for the last 2 weeks.  REVIEW OF SYSTEMS:  Review of Systems  Constitutional:  Positive for fatigue.  Respiratory:  Positive for shortness of breath.   Genitourinary:  Negative for hematuria.   Neurological:  Positive for numbness (L leg).  Hematological:  Does not bruise/bleed easily.  Psychiatric/Behavioral:  Negative for sleep disturbance.   All other systems reviewed and are negative.   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   found during hospital stay with pancreatitis,  started coumadin;   last Abd CT in epic 07-29-2020  chronic thrombus portal venous   GERD (gastroesophageal reflux disease)    Heart murmur    History of acute pancreatitis    2016   necrotizing pancreatitis   History of adenomatous polyp of colon    History of DVT of lower extremity    History of GI bleed 08/2019   upper gi bleed due to duodenal ulcer   History of gout yrs ago   Hypertension    followed by pcp   Hypertrophic cardiomyopathy ([place])    followed by cardiology   Internal hemorrhoids    Mixed hyperlipidemia    Normocytic anemia    PAF (paroxysmal atrial fibrillation) ([place]) 04/2016   cardiologist-- dr h. Tamala Julian--  event monitor 07-04-2017 epic, NSR/ PAflutter with occasional RVR/  PACs/ PVCs ;  echo 05-16-2017 epic,  moderate LVH with severe asymptomic septal hypertrophy, G1DD, EF 60-65%,  mild AR, mild LAE   Pancreatic pseudocyst    Peyronie's disease    Prostate cancer [facility]) urologist-- dr wrenn/ oncologist-- dr Tammi Klippel   first dx 08/ 2009 Gleason 3+3 active survelliance;  until bx 07/ 2021  Stage T2b, Gleason 4+3   Right inguinal hernia    Type 2 diabetes mellitus ([place])  followed by pcp---  (11-03-2020 per pt currently not checking blood sugar due to glucose monitor broken)   Wears glasses    Wears partial dentures    upper and lower   Past Surgical History:  Procedure Laterality Date   BIOPSY  02/09/2022   Procedure: BIOPSY;  Surgeon: Milus Banister, MD;  Location: [facility];  Service: Endoscopy;;   CHOLECYSTECTOMY N/A 11/26/2015   Procedure: LAPAROSCOPIC CHOLECYSTECTOMY;  Surgeon: Aviva Signs, MD;  Location: [facility];  Service: General;  Laterality: N/A;   COLONOSCOPY  last one 09-06-2020  dr stark   CYSTOSCOPY N/A 11/09/2020   Procedure: Erlene Quan;  Surgeon: Irine Seal, MD;  Location: [facility];  Service: Urology;  Laterality: N/A;   ESOPHAGOGASTRODUODENOSCOPY  10-06-2019  '@[facility]'$    ESOPHAGOGASTRODUODENOSCOPY N/A 02/09/2022   Procedure: ESOPHAGOGASTRODUODENOSCOPY (EGD);  Surgeon: Milus Banister, MD;  Location: [facility];  Service: Endoscopy;  Laterality: N/A;   EUS N/A 02/09/2022   Procedure: UPPER ENDOSCOPIC ULTRASOUND (EUS) RADIAL;  Surgeon: Milus Banister, MD;  Location: [facility];  Service: Endoscopy;  Laterality: N/A;   FLEXIBLE BRONCHOSCOPY Bilateral 04/14/2016   Procedure: FLEXIBLE BRONCHOSCOPY;  Surgeon: Sinda Du, MD;  [facility];  Service: Cardiopulmonary;  Laterality: Bilateral;   HEMORRHOID SURGERY  yrs ago   RADIOACTIVE SEED IMPLANT N/A 11/09/2020   Procedure: RADIOACTIVE SEED IMPLANT/BRACHYTHERAPY IMPLANT;  Surgeon: Irine Seal, MD;  [facility];  Service: Urology;  Laterality: N/A;   SPACE OAR INSTILLATION N/A 11/09/2020   Procedure: SPACE OAR INSTILLATION;  Surgeon: Irine Seal, MD;  Location[facility];  Service: Urology;  Laterality: N/A;   UMBILICAL HERNIA REPAIR N/A 11/26/2015   Procedure: UMBILICAL HERNIORRHAPHY;  Surgeon: Aviva Signs, MD;  Location: [facility];  Service: General;  Laterality: N/A;    SOCIAL HISTORY:  Social History   Socioeconomic History   Marital status: Married    Spouse name: [name]   Number of children: 4   Years of education: Not on file   Highest education level: 10th grade  Occupational History   Occupation: Retired     Fish farm manager: [place]  Tobacco Use   Smoking status: Former    Years: 5.00    Types: Cigarettes    Quit date: 12/11/1986    Years since quitting: 36.0   Smokeless tobacco: Never  Vaping Use   Vaping Use: Never used  Substance and Sexual Activity   Alcohol use: No   Drug use: Never   Sexual activity: Yes  Other Topics Concern   Not on file  Social History Narrative   Not on  file   Social Determinants of Health   Financial Resource Strain: Low Risk  (08/12/2021)   Overall Financial Resource Strain (CARDIA)    Difficulty of Paying Living Expenses: Not hard at all  Food Insecurity: No Food Insecurity (08/12/2021)   Hunger Vital Sign    Worried About Running Out of Food in the Last Year: Never true    [place] in the Last Year: Never true  Transportation Needs: No Transportation Needs (08/12/2021)   PRAPARE - Hydrologist (Medical): No    Lack of Transportation (Non-Medical): No  Physical Activity: Inactive (10/25/2021)   Exercise Vital Sign    Days of Exercise per Week: 0 days    Minutes of Exercise per Session: 0 min  Stress: Stress Concern Present (10/25/2021)   [place]  Stress Questionnaire    Feeling of Stress : To some extent  Social Connections: Socially Integrated (08/12/2021)   Social Connection and Isolation Panel [NHANES]    Frequency of Communication with Friends and Family: Three times a week    Frequency of Social Gatherings with Friends and Family: Three times a week    Attends Religious Services: More than 4 times per year    Active Member of Clubs or Organizations: Yes    Attends Archivist Meetings: More than 4 times per year    Marital Status: Married  Human resources officer Violence: Not At Risk (08/12/2021)   Humiliation, Afraid, Rape, and Kick questionnaire    Fear of Current or Ex-Partner: No    Emotionally Abused: No    Physically Abused: No    Sexually Abused: No    FAMILY HISTORY:  Family History  Problem Relation Age of Onset   Stroke Mother    Hypertension Mother    Alzheimer's disease Mother    Hypertension Father    Brain cancer Sister    Breast cancer Sister    Hypertension Sister    Deep vein thrombosis Brother    Hypertension Brother    Colon cancer Neg Hx    Pancreatic disease Neg Hx    Esophageal cancer Neg Hx    Rectal cancer  Neg Hx    Stomach cancer Neg Hx     CURRENT MEDICATIONS:  Current Outpatient Medications  Medication Sig Dispense Refill   abiraterone acetate (ZYTIGA) 250 MG tablet Take 4 tablets (1,000 mg total) by mouth daily. Take on an empty stomach 1 hour before or 2 hours after a meal 120 tablet 3   acetaminophen (TYLENOL) 500 MG tablet Take 1,000 mg by mouth every 6 (six) hours as needed for moderate pain.     amLODipine (NORVASC) 10 MG tablet Take 1 tablet (10 mg total) by mouth every evening. 90 tablet 3   Blood Glucose Monitoring Suppl (ONE TOUCH ULTRA MINI) w/Device KIT 1 each by Does not apply route 2 (two) times daily. 1 kit 1   Blood Pressure KIT 1 each by Does not apply route daily. 1 kit 0   Calcium Carb-Cholecalciferol (CALCIUM 600 + D PO) Take 1 tablet by mouth in the morning and at bedtime.     calcium carbonate (OSCAL) 1500 (600 Ca) MG TABS tablet Take by mouth 2 (two) times daily with a meal.     chlorthalidone (HYGROTON) 25 MG tablet Take 1 tablet by mouth once daily 90 tablet 3   diclofenac Sodium (VOLTAREN) 1 % GEL Apply 1 application topically 2 (two) times daily as needed (pain).     ferrous sulfate (SV IRON) 325 (65 FE) MG tablet Take 1 tablet (325 mg total) by mouth daily with breakfast. 90 tablet 1   fluticasone (FLONASE) 50 MCG/ACT nasal spray Place 1 spray into both nostrils 2 (two) times daily as needed for allergies or rhinitis. 16 g 6   glucose blood (ONE TOUCH ULTRA TEST) test strip Use to check BG once daily.  Dx:  Type 2 DM controlled E11.9 100 each 4   HYDROcodone-acetaminophen (NORCO/VICODIN) 5-325 MG tablet Take 1 tablet by mouth every 6 (six) hours as needed for moderate pain. Can take every 4 hours if needed. 30 tablet 0   lisinopril (ZESTRIL) 40 MG tablet Take 1 tablet (40 mg total) by mouth daily. 90 tablet 1   magnesium oxide (MAG-OX) 400 (240 Mg) MG tablet Take 1 tablet (400 mg  total) by mouth in the morning, at noon, and at bedtime. 90 tablet 6   metFORMIN  (GLUCOPHAGE) 1000 MG tablet Take 1 tablet (1,000 mg total) by mouth 2 (two) times daily with a meal. 180 tablet 3   metoprolol succinate (TOPROL-XL) 100 MG 24 hr tablet Take 1 tablet (100 mg total) by mouth daily. Take with or immediately following a meal. 90 tablet 3   omeprazole (PRILOSEC) 20 MG capsule Take 1 capsule (20 mg total) by mouth daily. 90 capsule 3   potassium chloride SA (KLOR-CON M) 20 MEQ tablet TAKE 1  BY MOUTH ONCE DAILY Strength: 20 mEq 90 tablet 1   pravastatin (PRAVACHOL) 40 MG tablet Take 1 tablet (40 mg total) by mouth every evening. 90 tablet 3   predniSONE (DELTASONE) 5 MG tablet Take 1 tablet (5 mg total) by mouth daily with breakfast. 90 tablet 2   warfarin (COUMADIN) 10 MG tablet Take 1 tablet (10 mg total) by mouth daily. 90 tablet 3   No current facility-administered medications for this visit.   Facility-Administered Medications Ordered in Other Visits  Medication Dose Route Frequency Provider Last Rate Last Admin   0.9 %  sodium chloride infusion (Manually program via Guardrails IV Fluids)  250 mL Intravenous Once Derek Jack, MD       acetaminophen (TYLENOL) tablet 650 mg  650 mg Oral Once Derek Jack, MD       diphenhydrAMINE (BENADRYL) capsule 25 mg  25 mg Oral Once Derek Jack, MD        ALLERGIES:  Allergies  Allergen Reactions   Allopurinol Other (See Comments)    Significantly decreased WBC's   Colchicine Other (See Comments)    Significantly decreased WBC's    PHYSICAL EXAM:  Performance status (ECOG): 1 - Symptomatic but completely ambulatory  There were no vitals filed for this visit.   Wt Readings from Last 3 Encounters:  12/27/22 145 lb (65.8 kg)  12/01/22 144 lb (65.3 kg)  11/29/22 143 lb (64.9 kg)   Physical Exam Vitals reviewed.  Constitutional:      Appearance: Normal appearance.     Comments: In wheelchair  Cardiovascular:     Rate and Rhythm: Normal rate and regular rhythm.     Pulses: Normal  pulses.     Heart sounds: Normal heart sounds.  Pulmonary:     Effort: Pulmonary effort is normal.     Breath sounds: Normal breath sounds.  Skin:    Findings: Erythema (lesions on thighs bilaterally), lesion (Hyperpigmented prodominently on thighs) and rash (Hyperpigmented lesions prodominently on thighs) present. Rash is macular and papular. Rash is not scaling.  Neurological:     General: No focal deficit present.     Mental Status: He is alert and oriented to person, place, and time.  Psychiatric:        Mood and Affect: Mood normal.        Behavior: Behavior normal.      LABORATORY DATA:  I have reviewed the labs as listed.     Latest Ref Rng & Units 12/26/2022    1:26 PM 11/29/2022   10:19 AM 10/31/2022   11:10 AM  CBC  WBC 4.0 - 10.5 K/uL 5.1  5.8  4.4   Hemoglobin 13.0 - 17.0 g/dL 6.2  8.1  8.1   Hematocrit 39.0 - 52.0 % 20.5  25.5  26.7   Platelets 150 - 400 K/uL 213  189  234       Latest Ref  Rng & Units 12/26/2022    1:26 PM 11/29/2022   10:19 AM 10/31/2022   11:10 AM  CMP  Glucose 70 - 99 mg/dL 236  210  165   BUN 8 - 23 mg/dL 41  18  18   Creatinine 0.61 - 1.24 mg/dL 1.28  1.05  1.09   Sodium 135 - 145 mmol/L 133  132  137   Potassium 3.5 - 5.1 mmol/L 2.9  3.6  3.2   Chloride 98 - 111 mmol/L 98  96  101   CO2 22 - 32 mmol/L '20  24  27   '$ Calcium 8.9 - 10.3 mg/dL 9.2  8.9  9.1   Total Protein 6.5 - 8.1 g/dL 6.2  6.7  6.9   Total Bilirubin 0.3 - 1.2 mg/dL 0.5  0.7  0.5   Alkaline Phos 38 - 126 U/L 100  77  59   AST 15 - 41 U/L 36  24  20   ALT 0 - 44 U/L '16  6  9     '$ DIAGNOSTIC IMAGING:  I have independently reviewed the scans and discussed with the patient. No results found.   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  He has been on finasteride. - Biopsy on 12/24/2013: Right lateral mid-prostatic adenocarcinoma, Gleason 3+3=6 - Biopsy on 06/25/2020: Prostatic adenocarcinoma,  T2b, Gleason 4+3=7, PSA 12.8 (adjusted for finasteride) - Bone scan on 07/06/2020 and CT on 07/29/2020 negative. - 11/09/2020: Insertion of radioactive I-125 seeds - PSA 5.4 (6/22), 53.2 (11/17/2021), 66.7 (11/22/2021), 382 (01/12/2022) - CTAP on 11/25/2021 with no evidence of metastatic disease.  New ill-defined soft tissue adjacent to the pancreatic body encasing the proximal splenic artery, sequela of interval pancreatitis although neoplasm is considered.  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 - Bone scan on 12/08/2021 with possible uptake in the lower thoracic vertebral body.  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 - CT chest without contrast on 01/03/2022 with no CT correlate in the lower thoracic spine.  No additional metastatic disease. - Degarelix  240 mg by Dr. Jeffie Pollock on 01/12/2022. - PSMA PET scan on 01/30/2022: Multiple skeletal metastasis in the axillary and appendicular skeleton with nearly every vertebral body involved with carcinoma.  Broad lesions in the sacrum and pelvis.  Focal activity in the anterior apical region of the prostate gland.  No metastatic adenopathy or visceral metastasis. [place] 3 tablets daily started around 02/08/2022, dose increased to 4 tablets daily on 03/21/2022. -He was hospitalized from 03/12/2022 through 03/14/2022 with pneumonia.  I have reviewed hospitalization records.  He reportedly fell on the night of 03/14/2022 when he lost balance while hanging his robe. - Erleada dose reduced to 3 tablets daily on 04/10/2022 due to diarrhea and rash.  Discontinued on 10/03/2022 due to progression.  Abiraterone and prednisone started on 10/05/2022   Social/family history: - Lives with wife and granddaughter at home.  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  Quit smoking 50 years ago. - 2 sisters had brain cancer.  Maternal grandmother had cancer.  Niece had breast cancer.   PLAN:  Metastatic CSPC to the bones: -  Abiraterone and prednisone was started on 09/30/2022. - He received Firmagon injection around 20 December. - His next Mills Koller is tomorrow. - His PSA has gone up to 281 from 156. - We will request a CT AP with contrast and a bone scan.  We  will consider stopping Abiraterone after he completes the current bottle.  RTC 2 weeks for follow-up.  2.  Bone metastasis: - He denies any problems in his lower jaw teeth.  Calcium today is 9.2.  No breakdown of the gums. - He will resume denosumab today.  3.  Atrial fibrillation and history of unprovoked right leg DVT: - Continue warfarin daily.  Last INR is 2.3 on 12/01/2022.  4.  Normocytic anemia: - Last Feraheme was on 11/07/2022.  His hemoglobin is down to 6.2 most likely from GI bleed.  He reports melena for the last 2 to 3 weeks.  Will check stool x 3. - He will receive 1 unit PRBC. - We will give him Feraheme x 2.  5.  Hypomagnesemia: - Continue magnesium 3 times daily.  Magnesium is normal today.   Orders placed this encounter:  No orders of the defined types were placed in this encounter.      Derek Jack, MD [place] [phone]

## 2022-12-28 ENCOUNTER — Ambulatory Visit (INDEPENDENT_AMBULATORY_CARE_PROVIDER_SITE_OTHER): Payer: No Typology Code available for payment source | Admitting: Urology

## 2022-12-28 VITALS — BP 154/64 | HR 103

## 2022-12-28 DIAGNOSIS — N403 Nodular prostate with lower urinary tract symptoms: Secondary | ICD-10-CM | POA: Diagnosis not present

## 2022-12-28 DIAGNOSIS — R351 Nocturia: Secondary | ICD-10-CM

## 2022-12-28 DIAGNOSIS — C61 Malignant neoplasm of prostate: Secondary | ICD-10-CM

## 2022-12-28 DIAGNOSIS — R9721 Rising PSA following treatment for malignant neoplasm of prostate: Secondary | ICD-10-CM

## 2022-12-28 DIAGNOSIS — N138 Other obstructive and reflux uropathy: Secondary | ICD-10-CM

## 2022-12-28 DIAGNOSIS — C7951 Secondary malignant neoplasm of bone: Secondary | ICD-10-CM | POA: Diagnosis not present

## 2022-12-28 LAB — TYPE AND SCREEN
ABO/RH(D): B POS
Antibody Screen: NEGATIVE
Unit division: 0

## 2022-12-28 LAB — BPAM RBC
Blood Product Expiration Date: 202401282359
ISSUE DATE / TIME: 202401171054
Unit Type and Rh: 1700

## 2022-12-28 MED ORDER — DEGARELIX ACETATE 80 MG ~~LOC~~ SOLR
80.0000 mg | Freq: Once | SUBCUTANEOUS | Status: AC
  Administered 2022-12-28: 80 mg via SUBCUTANEOUS

## 2022-12-28 NOTE — Progress Notes (Signed)
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   Medication: Mills Koller (Degarelix)  Dose: '80mg'$  Location: right upper abdomen Lot: [id number] Exp: 11/10/2024  Patient tolerated well, no complications were noted  Performed by: [email or web address], CMA  Follow up: Keep firmagon  12/26/22

## 2022-12-28 NOTE — Progress Notes (Signed)
Subjective:  1. Prostate cancer ([place])   2. 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 Rising PSA following treatment for malignant neoplasm of prostate   4. Nodular prostate with urinary obstruction   5. Nocturia      GU Hx: Mr. 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   His PSA has declined to 5.4 from 8.1 in 3/22.  He is voiding ok with moderate LUTS and an IPSS of 14 with nocturia x 3.   He has had no further gross hematuria and his UA is unremarkable today.   He remains on warfarin and his INR is 2.4 on 05/12/21.     On initial biopsy, he had only 5% in one core and elected active survelliance. He had a repeat biopsy on 12/24/13 that showed a single core with <5% Gleason 6 in the right mid lateral prostate. His last biopsy was on 06/28/18 and only showed 2 cores with atypia. He is on finasteride and has had  a slowly rising PSA that was up to 6.4 prior to his most recent biopsy on 06/25/20.  The nadir on finasteride was 1.8.    The repeat biopsy demonstrated 6 positive cores with 2 cores with 7(4+3) on the right and 4 cores with low volume Gleason 6 disease.    His prostate volume is 92m. IPSS is 9.  A bone scan on 07/06/20 was negative.    The CT on 07/29/20 was negative for mets.   His PSA was 6.2 prior to therapy starting finasteride.   11/24/21:  Mr. WWhetselreturns today in f/u for the history above.  His PSA was 5.4 in 6/22 but a repeat prior to this visit was 53.2 on 11/17/21 and then 66.7 on 11/22/21.  He has no weight loss but he has some new hip pain for the last 3 weeks.  He has no new voiding complaint but he has urgency with UUI.  His UA is unremarkable today.   01/12/22: Mr. WLambertsonreturns today in f/u.  He was found to have a rapidly rising PSA and was restaged.  The bone  scan showed a possible thoracic vertebral met but this was not confirmed by Chest CT.   The CT AP showed no evidence of mets but a new peripancreatic process so GI was consulted.   An abdominal MRI was obtained that showed multiple mets in the pelvic bones.   He is scheduled for upper endoscopy for the pancreatic lesion on 02/09/22.    02/16/22: Mr. WAhlersreturns today in f/u for his history of metastatic prostate cancer.  He got an initial dose of Firmagon on 01/12/22.   His PSA was 382 prior to the shot.   He had labs on 01/26/22 with Oncology and his PSA was down to 72 but he has an elevated Alk phos of 392 and a hgb of 9.5.  Apalutamide has been prescribed and he is tolerating it well without fatigue but he has some constipation.  He has no hot flashes.  He has back pain after a fall about 2 weeks ago and has to sleep sitting up but it is slowly improving but he had no fractures.   He had an upper endoscopy last week and had a negative biopsy in the stomach.  He didn't have a biopsy of the pancreas.  He eats well.   He is due for firmagon '80mg'$  today and has f/u on 3/20 with Dr. Delton Coombes.   05/25/22: Tyland returns today in f/u for his history of metastatic prostate cancer.  He remains on firmagon '80mg'$  monthly and last got an injection on 04/20/22 and is due today.  He remains on apalutamide with Dr. Delton Coombes.  His PSA is 3.57 on 05/22/22 which is minimally increased from 3.26 on 05/09/22 but down from 72.3 on 01/26/22.  He is voiding without difficulty.  He has some nocturia. He has some mild left lateral lower leg pain.  He has last lost some weight over the last few months.   He has no neurologic complaints.   He is not having hot flashes.   His glucose was 202 on 6/12 and his Albumin was 3.2.  He has had a progressive anemia with a hgb of  7.8 on 6/12 and he got a unit of PRBC's.    08/31/22: Moishe returns today for firmagon for continued therapy of metastatic prostate cancer.  He is on apalutamide as well.  His  last PSA in 8/23 was up to 14.94.  He last had imaging with a CT on 06/02/22 that showed persistent wide spread osseous mets.  His weight is stable.  He is not having bone pain.  He has no hot flashes.  He is voiding well with an IPSS of 8.  He had labs on 08/31/22 and his Cr was up to 1.3.  He has no flank pain or hematuria.   He remains on xgeva.   He has a diffuse skin rash on the erleada and has been using cortisone.  His UA is clear.   12/28/22: Cordarrius returns today in f/u.  He got his last [place] on 11/29/22 and Xgeva on 12/27/22.  He remains on abiterone and prednisone.  His labs on 12/26/22 had a Cr of 1.28 and a K of 2.9.  He is anemic with a Hgb of 6.2 and has been on iron.  He is scheduled to get checked for blood in the stool. He got transfused yesterday.  He was very weak prior to transfusion.  His INR is 2.3 on warfarin.  His PSA is up to 281 on 1/16 from 156.  He is scheduled for CT and [place] in February and had diffuse bone mets on CT and bone scan in 10/23.  He is due for firmagon today.  His weight is stable.  He has had no bone pain.   He has hot flashes.          ROS:  ROS:  A complete review of systems was performed.  All systems are negative except for pertinent findings as noted.   Review of Systems  Skin:  Positive for rash (on both arms and back since he has been on erleada.).  All other systems reviewed and are negative.   Allergies  Allergen Reactions   Allopurinol Other (See Comments)    Significantly decreased WBC's   Colchicine Other (See Comments)    Significantly decreased WBC's    Outpatient Encounter Medications as of 12/28/2022  Medication Sig   abiraterone acetate (ZYTIGA) 250 MG tablet Take 4 tablets (1,000 mg total) by mouth daily. Take on an empty stomach 1 hour before or 2 hours after a meal   acetaminophen (TYLENOL) 500 MG tablet Take 1,000 mg by mouth every 6 (six) hours as needed for moderate pain.  amLODipine (NORVASC) 10 MG tablet Take 1 tablet  (10 mg total) by mouth every evening.   Blood Glucose Monitoring Suppl (ONE TOUCH ULTRA MINI) w/Device KIT 1 each by Does not apply route 2 (two) times daily.   Blood Pressure KIT 1 each by Does not apply route daily.   Calcium Carb-Cholecalciferol (CALCIUM 600 + D PO) Take 1 tablet by mouth in the morning and at bedtime.   calcium carbonate (OSCAL) 1500 (600 Ca) MG TABS tablet Take by mouth 2 (two) times daily with a meal.   chlorthalidone (HYGROTON) 25 MG tablet Take 1 tablet by mouth once daily   diclofenac Sodium (VOLTAREN) 1 % GEL Apply 1 application topically 2 (two) times daily as needed (pain).   ferrous sulfate (SV IRON) 325 (65 FE) MG tablet Take 1 tablet (325 mg total) by mouth daily with breakfast.   fluticasone (FLONASE) 50 MCG/ACT nasal spray Place 1 spray into both nostrils 2 (two) times daily as needed for allergies or rhinitis.   glucose blood (ONE TOUCH ULTRA TEST) test strip Use to check BG once daily.  Dx:  Type 2 DM controlled E11.9   HYDROcodone-acetaminophen (NORCO/VICODIN) 5-325 MG tablet Take 1 tablet by mouth every 6 (six) hours as needed for moderate pain. Can take every 4 hours if needed.   lisinopril (ZESTRIL) 40 MG tablet Take 1 tablet (40 mg total) by mouth daily.   magnesium oxide (MAG-OX) 400 (240 Mg) MG tablet Take 1 tablet (400 mg total) by mouth in the morning, at noon, and at bedtime.   metFORMIN (GLUCOPHAGE) 1000 MG tablet Take 1 tablet (1,000 mg total) by mouth 2 (two) times daily with a meal.   metoprolol succinate (TOPROL-XL) 100 MG 24 hr tablet Take 1 tablet (100 mg total) by mouth daily. Take with or immediately following a meal.   omeprazole (PRILOSEC) 20 MG capsule Take 1 capsule (20 mg total) by mouth daily.   potassium chloride SA (KLOR-CON M) 20 MEQ tablet TAKE 1  BY MOUTH ONCE DAILY Strength: 20 mEq   pravastatin (PRAVACHOL) 40 MG tablet Take 1 tablet (40 mg total) by mouth every evening.   predniSONE (DELTASONE) 5 MG tablet Take 1 tablet (5 mg  total) by mouth daily with breakfast.   warfarin (COUMADIN) 10 MG tablet Take 1 tablet (10 mg total) by mouth daily.   [EXPIRED] degarelix (FIRMAGON) injection 80 mg    No facility-administered encounter medications on file as of 12/28/2022.    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   found during hospital stay with pancreatitis,  started coumadin;   last Abd CT in epic 07-29-2020  chronic thrombus portal venous   GERD (gastroesophageal reflux disease)    Heart murmur    History of acute pancreatitis    2016   necrotizing pancreatitis   History of adenomatous polyp of colon    History of DVT of lower extremity    History of GI bleed 08/2019   upper gi bleed due to duodenal ulcer   History of gout yrs ago   Hypertension    followed by pcp   Hypertrophic cardiomyopathy ([place])    followed by cardiology   Internal hemorrhoids  Mixed hyperlipidemia    Normocytic anemia    PAF (paroxysmal atrial fibrillation) ([place]) 04/2016   cardiologist-- dr h. Tamala Julian--  event monitor 07-04-2017 epic, NSR/ PAflutter with occasional RVR/  PACs/ PVCs ;  echo 05-16-2017 epic,  moderate LVH with severe asymptomic septal hypertrophy, G1DD, EF 60-65%,  mild AR, mild LAE   Pancreatic pseudocyst    Peyronie's disease    Prostate [facility]) urologist-- dr Luci Bellucci/ oncologist-- dr Tammi Klippel   first dx 08/ 2009 Gleason 3+3 active survelliance;  until bx 07/ 2021  Stage T2b, Gleason 4+3   Right inguinal hernia    Type 2 diabetes mellitus ([place])    followed by pcp---  (11-03-2020 per pt currently not checking blood sugar due to glucose monitor broken)   Wears glasses    Wears partial dentures    upper and lower    Past Surgical History:  Procedure Laterality Date    BIOPSY  02/09/2022   Procedure: BIOPSY;  Surgeon: Milus Banister, MD;  Location: [facility];  Service: Endoscopy;;   CHOLECYSTECTOMY N/A 11/26/2015   Procedure: LAPAROSCOPIC CHOLECYSTECTOMY;  Surgeon: Aviva Signs, MD;  Location: [facility];  Service: General;  Laterality: N/A;   COLONOSCOPY  last one 09-06-2020  dr stark   CYSTOSCOPY N/A 11/09/2020   Procedure: Erlene Quan;  Surgeon: Irine Seal, MD;  Location: [facility];  Service: Urology;  Laterality: N/A;   ESOPHAGOGASTRODUODENOSCOPY  10-06-2019  '@[facility]'$    ESOPHAGOGASTRODUODENOSCOPY N/A 02/09/2022   Procedure: ESOPHAGOGASTRODUODENOSCOPY (EGD);  Surgeon: Milus Banister, MD;  Location: [facility];  Service: Endoscopy;  Laterality: N/A;   EUS N/A 02/09/2022   Procedure: UPPER ENDOSCOPIC ULTRASOUND (EUS) RADIAL;  Surgeon: Milus Banister, MD;  Location: [facility];  Service: Endoscopy;  Laterality: N/A;   FLEXIBLE BRONCHOSCOPY Bilateral 04/14/2016   Procedure: FLEXIBLE BRONCHOSCOPY;  Surgeon: Sinda Du, MD;  [facility];  Service: Cardiopulmonary;  Laterality: Bilateral;   HEMORRHOID SURGERY  yrs ago   RADIOACTIVE SEED IMPLANT N/A 11/09/2020   Procedure: RADIOACTIVE SEED IMPLANT/BRACHYTHERAPY IMPLANT;  Surgeon: Irine Seal, MD;  [facility];  Service: Urology;  Laterality: N/A;   SPACE OAR INSTILLATION N/A 11/09/2020   Procedure: SPACE OAR INSTILLATION;  Surgeon: Irine Seal, MD;  Location: [facility];  Service: Urology;  Laterality: N/A;   UMBILICAL HERNIA REPAIR N/A 11/26/2015   Procedure: UMBILICAL HERNIORRHAPHY;  Surgeon: Aviva Signs, MD;  Location[facility];  Service: General;  Laterality: N/A;    Social History   Socioeconomic History   Marital status: Married    Spouse name: [name]   Number of children: 4   Years of education: Not on file   Highest education level: 10th grade  Occupational History   Occupation: Retired     Fish farm manager: [employer]   Tobacco Use   Smoking status: Former    Years: 5.00    Types: Cigarettes    Quit date: 12/11/1986    Years since quitting: 36.0   Smokeless tobacco: Never  Vaping Use   Vaping Use: Never used  Substance and Sexual Activity   Alcohol use: No   Drug use: Never   Sexual activity: Yes  Other Topics Concern   Not on file  Social History Narrative   Not on file   Social Determinants of Health   Financial Resource Strain: Low Risk  (08/12/2021)   Overall Financial Resource Strain (CARDIA)    Difficulty of Paying Living Expenses: Not hard at all  Food  Insecurity: No Food Insecurity (08/12/2021)   Hunger Vital Sign    Worried About Running Out of Food in the Last Year: Never true    Ran Out of Food in the Last Year: Never true  Transportation Needs: No Transportation Needs (08/12/2021)   PRAPARE - Hydrologist (Medical): No    Lack of Transportation (Non-Medical): No  Physical Activity: Inactive (10/25/2021)   Exercise Vital Sign    Days of Exercise per Week: 0 days    Minutes of Exercise per Session: 0 min  Stress: Stress Concern Present (10/25/2021)   [place]    Feeling of Stress : To some extent  Social Connections: Socially Integrated (08/12/2021)   Social Connection and Isolation Panel [NHANES]    Frequency of Communication with Friends and Family: Three times a week    Frequency of Social Gatherings with Friends and Family: Three times a week    Attends Religious Services: More than 4 times per year    Active Member of Clubs or Organizations: Yes    Attends Archivist Meetings: More than 4 times per year    Marital Status: Married  Human resources officer Violence: Not At Risk (08/12/2021)   Humiliation, Afraid, Rape, and Kick questionnaire    Fear of Current or Ex-Partner: No    Emotionally Abused: No    Physically Abused: No    Sexually Abused: No    Family History  Problem  Relation Age of Onset   Stroke Mother    Hypertension Mother    Alzheimer's disease Mother    Hypertension Father    Brain cancer Sister    Breast cancer Sister    Hypertension Sister    Deep vein thrombosis Brother    Hypertension Brother    Colon cancer Neg Hx    Pancreatic disease Neg Hx    Esophageal cancer Neg Hx    Rectal cancer Neg Hx    Stomach cancer Neg Hx        Objective: Vitals:   12/28/22 1551  BP: (!) 154/64  Pulse: (!) 103     Physical Exam  Latest Reference Range & Units 01/26/22 14:23  Prostatic Specific Antigen 0.00 - 4.00 ng/mL 72.30 (H)  (H): Data is abnormally high    Lab Results:  No results found for this or any previous visit (from the past 24 hour(s)).  Recent Results (from the past 2160 hour(s))  CBC with Differential/Platelet     Status: Abnormal   Collection Time: 10/03/22 12:26 PM  Result Value Ref Range   WBC 4.0 4.0 - 10.5 K/uL   RBC 2.23 (L) 4.22 - 5.81 MIL/uL   Hemoglobin 6.4 (LL) 13.0 - 17.0 g/dL    Comment: This critical result has verified and been called to R CARTER RN by Remus Loffler on 10 24 2023 at 1256, and has been read back.    HCT 20.9 (L) 39.0 - 52.0 %   MCV 93.7 80.0 - 100.0 fL   MCH 28.7 26.0 - 34.0 pg   MCHC 30.6 30.0 - 36.0 g/dL   RDW 14.9 11.5 - 15.5 %   Platelets 255 150 - 400 K/uL   nRBC 0.0 0.0 - 0.2 %   Neutrophils Relative % 71 %   Neutro Abs 2.8 1.7 - 7.7 K/uL   Lymphocytes Relative 16 %   Lymphs Abs 0.7 0.7 - 4.0 K/uL   Monocytes Relative 9 %  Monocytes Absolute 0.4 0.1 - 1.0 K/uL   Eosinophils Relative 3 %   Eosinophils Absolute 0.1 0.0 - 0.5 K/uL   Basophils Relative 0 %   Basophils Absolute 0.0 0.0 - 0.1 K/uL   Immature Granulocytes 1 %   Abs Immature Granulocytes 0.02 0.00 - 0.07 K/uL    Comment: Performed at [facility], [address] [place], [place] [address]  Comprehensive metabolic panel     Status: Abnormal   Collection Time: 10/03/22 12:26 PM  Result Value Ref Range    Sodium 137 135 - 145 mmol/L   Potassium 4.6 3.5 - 5.1 mmol/L   Chloride 104 98 - 111 mmol/L   CO2 24 22 - 32 mmol/L   Glucose, Bld 186 (H) 70 - 99 mg/dL    Comment: Glucose reference range applies only to samples taken after fasting for at least 8 hours.   BUN 18 8 - 23 mg/dL   Creatinine, Ser 1.20 0.61 - 1.24 mg/dL   Calcium 9.1 8.9 - 10.3 mg/dL   Total Protein 6.6 6.5 - 8.1 g/dL   Albumin 3.3 (L) 3.5 - 5.0 g/dL   AST 19 15 - 41 U/L   ALT 11 0 - 44 U/L   Alkaline Phosphatase 53 38 - 126 U/L   Total Bilirubin 0.2 (L) 0.3 - 1.2 mg/dL   GFR, Estimated >60 >60 mL/min    Comment: (NOTE) Calculated using the CKD-EPI Creatinine Equation (2021)    Anion gap 9 5 - 15    Comment: Performed at [facility], [address], [place], [place] [address]  Magnesium     Status: None   Collection Time: 10/03/22 12:26 PM  Result Value Ref Range   Magnesium 1.9 1.7 - 2.4 mg/dL    Comment: Performed at [facility], [address], [place], [place] [address]  Folate     Status: None   Collection Time: 10/03/22 12:26 PM  Result Value Ref Range   Folate 11.0 >5.9 ng/mL    Comment: Performed at [facility], [address], [place], [place] [address]  Vitamin B12     Status: None   Collection Time: 10/03/22 12:26 PM  Result Value Ref Range   Vitamin B-12 185 180 - 914 pg/mL    Comment: (NOTE) This assay is not validated for testing neonatal or myeloproliferative syndrome specimens for Vitamin B12 levels. Performed at [facility], [address] [place], [place] [address]   Iron and TIBC     Status: Abnormal   Collection Time: 10/03/22 12:26 PM  Result Value Ref Range   Iron 16 (L) 45 - 182 ug/dL   TIBC 374 250 - 450 ug/dL   Saturation Ratios 4 (L) 17.9 - 39.5 %   UIBC 358 ug/dL    Comment: Performed at [facility], [address], [place], [place] [address]  Ferritin     Status: Abnormal   Collection Time: 10/03/22 12:26 PM  Result Value Ref Range   Ferritin 8 (L) 24 - 336 ng/mL     Comment: Performed at [facility], [address] [place], [place] [address]  PSA     Status: Abnormal   Collection Time: 10/03/22 12:26 PM  Result Value Ref Range   Prostatic Specific Antigen 53.32 (H) 0.00 - 4.00 ng/mL    Comment: (NOTE) While PSA levels of <=4.00 ng/ml are reported as reference range, some men with levels below 4.00 ng/ml can have prostate cancer and many men with PSA above 4.00 ng/ml  do not have prostate cancer.  Other tests such as free PSA, age specific reference ranges, PSA velocity and PSA doubling time may be helpful especially in men less than 33 years old. Performed at [facility], [place] [address], [place], [place] [address]   ABO/Rh     Status: None   Collection Time: 10/03/22 12:26 PM  Result Value Ref Range   ABO/RH(D)      B POS Performed at [facility], [address], [place], [place] [address]   Prepare RBC (crossmatch)     Status: None   Collection Time: 10/03/22  1:59 PM  Result Value Ref Range   Order Confirmation      ORDER PROCESSED BY BLOOD BANK Performed at [facility], [address] [place], [place] [address]   Type and screen     Status: None   Collection Time: 10/03/22  1:59 PM  Result Value Ref Range   ABO/RH(D) B POS    Antibody Screen NEG    Sample Expiration 10/06/2022,2359    Unit Number [id number]    Blood Component Type RED CELLS,LR    Unit division 00    Status of Unit ISSUED,FINAL    Transfusion Status OK TO TRANSFUSE    Crossmatch Result      Compatible Performed at [facility], [address], [place], [place] [address]   BPAM RBC     Status: None   Collection Time: 10/03/22  1:59 PM  Result Value Ref Range   ISSUE DATE / TIME [id number]    Blood Product Unit Number [id number]    PRODUCT CODE [id number]    Unit Type and Rh 1700    Blood Product Expiration Date 786767209470   CoaguChek XS/INR Waived     Status: Abnormal   Collection Time: 10/16/22 12:58 PM  Result Value Ref Range   INR 1.7 (H)  0.9 - 1.1   Prothrombin Time 20.4 sec    Comment: Differences in reagents, instruments, and pre-analytical variables can affect prothrombin time results.  These factors should be considered when comparing different prothrombin time test methods. Please Note: This test should not be used to monitor persons on heparin therapy.   Bayer DCA Hb A1c Waived     Status: None   Collection Time: 10/16/22 12:58 PM  Result Value Ref Range   HB A1C (BAYER DCA - WAIVED) 5.6 4.8 - 5.6 %    Comment:          Prediabetes: 5.7 - 6.4          Diabetes: >6.4          Glycemic control for adults with diabetes: <7.0   Lipid panel     Status: Abnormal   Collection Time: 10/16/22  1:05 PM  Result Value Ref Range   Cholesterol, Total 90 (L) 100 - 199 mg/dL   Triglycerides 118 0 - 149 mg/dL   HDL 37 (L) >39 mg/dL   VLDL Cholesterol Cal 21 5 - 40 mg/dL   LDL Chol Calc (NIH) 32 0 - 99 mg/dL   Chol/HDL Ratio 2.4 0.0 - 5.0 ratio    Comment:                                   T. Chol/HDL Ratio  Men  Women                               1/2 Avg.Risk  3.4    3.3                                   Avg.Risk  5.0    4.4                                2X Avg.Risk  9.6    7.1                                3X Avg.Risk 23.4   11.0   CBC with Differential/Platelet     Status: Abnormal   Collection Time: 10/16/22  1:46 PM  Result Value Ref Range   WBC 5.0 3.4 - 10.8 x10E3/uL   RBC 3.02 (L) 4.14 - 5.80 x10E6/uL   Hemoglobin 7.8 (LL) 13.0 - 17.7 g/dL    Comment:                   Client Requested Flag   Hematocrit 25.3 (L) 37.5 - 51.0 %   MCV 84 79 - 97 fL   MCH 25.8 (L) 26.6 - 33.0 pg   MCHC 30.8 (L) 31.5 - 35.7 g/dL   RDW 17.7 (H) 11.6 - 15.4 %   Platelets 332 150 - 450 x10E3/uL   Neutrophils 76 Not Estab. %   Lymphs 12 Not Estab. %   Monocytes 10 Not Estab. %   Eos 2 Not Estab. %   Basos 0 Not Estab. %   Neutrophils Absolute 3.8 1.4 - 7.0 x10E3/uL   Lymphocytes  Absolute 0.6 (L) 0.7 - 3.1 x10E3/uL   Monocytes Absolute 0.5 0.1 - 0.9 x10E3/uL   EOS (ABSOLUTE) 0.1 0.0 - 0.4 x10E3/uL   Basophils Absolute 0.0 0.0 - 0.2 x10E3/uL   Immature Granulocytes 0 Not Estab. %   Immature Grans (Abs) 0.0 0.0 - 0.1 x10E3/uL  Magnesium     Status: None   Collection Time: 10/31/22 11:10 AM  Result Value Ref Range   Magnesium 1.8 1.7 - 2.4 mg/dL    Comment: Performed at [facility], [address] [place], [place] [address]  Comprehensive metabolic panel     Status: Abnormal   Collection Time: 10/31/22 11:10 AM  Result Value Ref Range   Sodium 137 135 - 145 mmol/L   Potassium 3.2 (L) 3.5 - 5.1 mmol/L   Chloride 101 98 - 111 mmol/L   CO2 27 22 - 32 mmol/L   Glucose, Bld 165 (H) 70 - 99 mg/dL    Comment: Glucose reference range applies only to samples taken after fasting for at least 8 hours.   BUN 18 8 - 23 mg/dL   Creatinine, Ser 1.09 0.61 - 1.24 mg/dL   Calcium 9.1 8.9 - 10.3 mg/dL   Total Protein 6.9 6.5 - 8.1 g/dL   Albumin 3.6 3.5 - 5.0 g/dL   AST 20 15 - 41 U/L   ALT 9 0 - 44 U/L   Alkaline Phosphatase 59 38 - 126 U/L   Total Bilirubin 0.5 0.3 - 1.2 mg/dL   GFR, Estimated >60 >60 mL/min    Comment: (NOTE)  Calculated using the CKD-EPI Creatinine Equation (2021)    Anion gap 9 5 - 15    Comment: Performed at [facility] [address], [place], [place] [address]  CBC with Differential     Status: Abnormal   Collection Time: 10/31/22 11:10 AM  Result Value Ref Range   WBC 4.4 4.0 - 10.5 K/uL   RBC 3.08 (L) 4.22 - 5.81 MIL/uL   Hemoglobin 8.1 (L) 13.0 - 17.0 g/dL   HCT 26.7 (L) 39.0 - 52.0 %   MCV 86.7 80.0 - 100.0 fL   MCH 26.3 26.0 - 34.0 pg   MCHC 30.3 30.0 - 36.0 g/dL   RDW 17.7 (H) 11.5 - 15.5 %   Platelets 234 150 - 400 K/uL   nRBC 0.0 0.0 - 0.2 %   Neutrophils Relative % 74 %   Neutro Abs 3.3 1.7 - 7.7 K/uL   Lymphocytes Relative 14 %   Lymphs Abs 0.6 (L) 0.7 - 4.0 K/uL   Monocytes Relative 8 %   Monocytes Absolute 0.3 0.1 - 1.0  K/uL   Eosinophils Relative 3 %   Eosinophils Absolute 0.1 0.0 - 0.5 K/uL   Basophils Relative 0 %   Basophils Absolute 0.0 0.0 - 0.1 K/uL   Immature Granulocytes 1 %   Abs Immature Granulocytes 0.02 0.00 - 0.07 K/uL    Comment: Performed at [facility], [address] [place], [place] [address]  PSA     Status: Abnormal   Collection Time: 10/31/22 11:11 AM  Result Value Ref Range   Prostatic Specific Antigen 84.94 (H) 0.00 - 4.00 ng/mL    Comment: (NOTE) While PSA levels of <=4.00 ng/ml are reported as reference range, some men with levels below 4.00 ng/ml can have prostate cancer and many men with PSA above 4.00 ng/ml do not have prostate cancer.  Other tests such as free PSA, age specific reference ranges, PSA velocity and PSA doubling time may be helpful especially in men less than 70 years old. Performed at [facility], [place] [address] [place], [place] [address]   CoaguChek Leonette Monarch Waived     Status: Abnormal   Collection Time: 11/16/22 11:00 AM  Result Value Ref Range   INR 4.1 (H) 0.9 - 1.1   Prothrombin Time 49.1 sec    Comment: Differences in reagents, instruments, and pre-analytical variables can affect prothrombin time results.  These factors should be considered when comparing different prothrombin time test methods. Please Note: This test should not be used to monitor persons on heparin therapy.   CBC with Differential     Status: Abnormal   Collection Time: 11/29/22 10:19 AM  Result Value Ref Range   WBC 5.8 4.0 - 10.5 K/uL   RBC 2.77 (L) 4.22 - 5.81 MIL/uL   Hemoglobin 8.1 (L) 13.0 - 17.0 g/dL   HCT 25.5 (L) 39.0 - 52.0 %   MCV 92.1 80.0 - 100.0 fL   MCH 29.2 26.0 - 34.0 pg   MCHC 31.8 30.0 - 36.0 g/dL   RDW 20.1 (H) 11.5 - 15.5 %   Platelets 189 150 - 400 K/uL   nRBC 0.0 0.0 - 0.2 %   Neutrophils Relative % 82 %   Neutro Abs 4.8 1.7 - 7.7 K/uL   Lymphocytes Relative 6 %   Lymphs Abs 0.4 (L) 0.7 - 4.0 K/uL   Monocytes Relative 11 %   Monocytes  Absolute 0.6 0.1 - 1.0 K/uL   Eosinophils Relative 0 %  Eosinophils Absolute 0.0 0.0 - 0.5 K/uL   Basophils Relative 0 %   Basophils Absolute 0.0 0.0 - 0.1 K/uL   Immature Granulocytes 1 %   Abs Immature Granulocytes 0.03 0.00 - 0.07 K/uL    Comment: Performed at [facility], [address] [place], [place] [address]  Comprehensive metabolic panel     Status: Abnormal   Collection Time: 11/29/22 10:19 AM  Result Value Ref Range   Sodium 132 (L) 135 - 145 mmol/L   Potassium 3.6 3.5 - 5.1 mmol/L   Chloride 96 (L) 98 - 111 mmol/L   CO2 24 22 - 32 mmol/L   Glucose, Bld 210 (H) 70 - 99 mg/dL    Comment: Glucose reference range applies only to samples taken after fasting for at least 8 hours.   BUN 18 8 - 23 mg/dL   Creatinine, Ser 1.05 0.61 - 1.24 mg/dL   Calcium 8.9 8.9 - 10.3 mg/dL   Total Protein 6.7 6.5 - 8.1 g/dL   Albumin 3.5 3.5 - 5.0 g/dL   AST 24 15 - 41 U/L   ALT 6 0 - 44 U/L   Alkaline Phosphatase 77 38 - 126 U/L   Total Bilirubin 0.7 0.3 - 1.2 mg/dL   GFR, Estimated >60 >60 mL/min    Comment: (NOTE) Calculated using the CKD-EPI Creatinine Equation (2021)    Anion gap 12 5 - 15    Comment: Performed at [facility], [address], [place], [place] [address]  Magnesium     Status: Abnormal   Collection Time: 11/29/22 10:19 AM  Result Value Ref Range   Magnesium 1.3 (L) 1.7 - 2.4 mg/dL    Comment: Performed at [facility], [address], [place], [place] [address]  PSA     Status: Abnormal   Collection Time: 11/29/22 10:19 AM  Result Value Ref Range   Prostatic Specific Antigen 156.00 (H) 0.00 - 4.00 ng/mL    Comment: RESULT CONFIRMED BY AUTOMATED DILUTION (NOTE) While PSA levels of <=4.00 ng/ml are reported as reference range, some men with levels below 4.00 ng/ml can have prostate cancer and many men with PSA above 4.00 ng/ml do not have prostate cancer.  Other tests such as free PSA, age specific reference ranges, PSA velocity and PSA doubling time may be  helpful especially in men less than 83 years old. Performed at [facility], [place] [address] [place], [place] [address]   Iron and TIBC [facility]/AP/[facility]/[place] ONLY)     Status: None   Collection Time: 11/29/22 12:17 PM  Result Value Ref Range   Iron 62 45 - 182 ug/dL   TIBC 325 250 - 450 ug/dL   Saturation Ratios 19 17.9 - 39.5 %   UIBC 263 ug/dL    Comment: Performed at [facility], [address] [place], [place] [address]  Ferritin     Status: None   Collection Time: 11/29/22 12:17 PM  Result Value Ref Range   Ferritin 95 24 - 336 ng/mL    Comment: Performed at [facility], [address], [place], [place] [address]  CoaguChek XS/INR Waived     Status: Abnormal   Collection Time: 12/01/22  4:17 PM  Result Value Ref Range   INR 2.3 (H) 0.9 - 1.1   Prothrombin Time 27.2 sec    Comment: Differences in reagents, instruments, and pre-analytical variables can affect prothrombin time results.  These factors should be considered when comparing different prothrombin time test methods. Please  Note: This test should not be used to monitor persons on heparin therapy.   CBC with Differential     Status: Abnormal   Collection Time: 12/26/22  1:26 PM  Result Value Ref Range   WBC 5.1 4.0 - 10.5 K/uL   RBC 2.16 (L) 4.22 - 5.81 MIL/uL   Hemoglobin 6.2 (LL) 13.0 - 17.0 g/dL    Comment: This critical result has verified and been called to [facility] RN by Eustace Pen on 01 16 2024 at 1359, and has been read back.    HCT 20.5 (L) 39.0 - 52.0 %   MCV 94.9 80.0 - 100.0 fL   MCH 28.7 26.0 - 34.0 pg   MCHC 30.2 30.0 - 36.0 g/dL   RDW 18.1 (H) 11.5 - 15.5 %   Platelets 213 150 - 400 K/uL   nRBC 0.0 0.0 - 0.2 %   Neutrophils Relative % 79 %   Neutro Abs 3.9 1.7 - 7.7 K/uL   Lymphocytes Relative 10 %   Lymphs Abs 0.5 (L) 0.7 - 4.0 K/uL   Monocytes Relative 10 %   Monocytes Absolute 0.5 0.1 - 1.0 K/uL   Eosinophils Relative 0 %   Eosinophils Absolute 0.0 0.0 - 0.5 K/uL    Basophils Relative 0 %   Basophils Absolute 0.0 0.0 - 0.1 K/uL   Immature Granulocytes 1 %   Abs Immature Granulocytes 0.04 0.00 - 0.07 K/uL    Comment: Performed at [facility], [address] [place], [place] [address]  Comprehensive metabolic panel     Status: Abnormal   Collection Time: 12/26/22  1:26 PM  Result Value Ref Range   Sodium 133 (L) 135 - 145 mmol/L   Potassium 2.9 (L) 3.5 - 5.1 mmol/L   Chloride 98 98 - 111 mmol/L   CO2 20 (L) 22 - 32 mmol/L   Glucose, Bld 236 (H) 70 - 99 mg/dL    Comment: Glucose reference range applies only to samples taken after fasting for at least 8 hours.   BUN 41 (H) 8 - 23 mg/dL   Creatinine, Ser 1.28 (H) 0.61 - 1.24 mg/dL   Calcium 9.2 8.9 - 10.3 mg/dL   Total Protein 6.2 (L) 6.5 - 8.1 g/dL   Albumin 3.0 (L) 3.5 - 5.0 g/dL   AST 36 15 - 41 U/L   ALT 16 0 - 44 U/L   Alkaline Phosphatase 100 38 - 126 U/L   Total Bilirubin 0.5 0.3 - 1.2 mg/dL   GFR, Estimated 57 (L) >60 mL/min    Comment: (NOTE) Calculated using the CKD-EPI Creatinine Equation (2021)    Anion gap 15 5 - 15    Comment: Performed at [facility], [address], [place], [place] [address]  Magnesium     Status: None   Collection Time: 12/26/22  1:26 PM  Result Value Ref Range   Magnesium 2.0 1.7 - 2.4 mg/dL    Comment: Performed at [facility], [address] [place], [place] [address]  PSA     Status: Abnormal   Collection Time: 12/26/22  1:35 PM  Result Value Ref Range   Prostatic Specific Antigen 281.00 (H) 0.00 - 4.00 ng/mL    Comment: RESULT CONFIRMED BY AUTOMATED DILUTION (NOTE) While PSA levels of <=4.00 ng/ml are reported as reference range, some men with levels below 4.00 ng/ml can have prostate cancer and many men with PSA above 4.00 ng/ml do not have prostate cancer.  Other tests such as free PSA,  age specific reference ranges, PSA velocity and PSA doubling time may be helpful especially in men less than 70 years old. Performed at [facility],  [place] [address], [place], [place] [address]   Type and screen     Status: None   Collection Time: 12/27/22  9:11 AM  Result Value Ref Range   ABO/RH(D) B POS    Antibody Screen NEG    Sample Expiration 12/30/2022,2359    Unit Number [id number]    Blood Component Type RED CELLS,LR    Unit division 00    Status of Unit ISSUED,FINAL    Transfusion Status OK TO TRANSFUSE    Crossmatch Result      Compatible Performed at [facility], [address], [place], [place] [address]   BPAM RBC     Status: None   Collection Time: 12/27/22  9:11 AM  Result Value Ref Range   ISSUE DATE / TIME [id number]    Blood Product Unit Number [id number]    PRODUCT CODE [id number]    Unit Type and Rh 1700    Blood Product Expiration Date [id number]   Prepare RBC (crossmatch)     Status: None   Collection Time: 12/27/22  9:12 AM  Result Value Ref Range   Order Confirmation      ORDER PROCESSED BY BLOOD BANK Performed at [facility], [address], [place],  [address]        BMET      Studies/Results: No results found. I have reviewed his last CT and bone scan from October.    Assessment & Plan: Prostate cancer metastatic to bone.   His PSA is rising on firmagon and abiaterone and pred after have a rash with apalutamide. He was weak with the anemia which is being evaluated. He has no other worrisome symptoms.  He will continue monthly firmagon in this office and see me in 3 months.    Dr. Delton Coombes will be obtaining labs.   Nodular prostate with obstruction.  He is voiding ok on current therapy.    AKI.   His Cr is stable at 1.28  He has no flank pain and has a clear urine.      Meds ordered this encounter  Medications   degarelix (FIRMAGON) injection 80 mg      No orders of the defined types were placed in this encounter.     Return in about 3 months (around 03/29/2023).   CC: Dettinger, Fransisca Kaufmann, MD and Dr. Derek Jack.      Sevyn Paredez 12/29/2022 Patient ID: [name], male   DOB: 1941/05/18, 82 y.o.   MRN: [id number] Patient ID: Maddox Hlavaty., male   DOB: Apr 15, 1941, 82 y.o.   MRN: [id number]

## 2022-12-29 ENCOUNTER — Other Ambulatory Visit (HOSPITAL_COMMUNITY): Payer: Self-pay

## 2022-12-29 ENCOUNTER — Ambulatory Visit: Payer: No Typology Code available for payment source

## 2022-12-29 ENCOUNTER — Encounter: Payer: Self-pay | Admitting: Urology

## 2023-01-01 ENCOUNTER — Inpatient Hospital Stay: Payer: No Typology Code available for payment source

## 2023-01-01 ENCOUNTER — Other Ambulatory Visit (HOSPITAL_COMMUNITY)
Admission: RE | Admit: 2023-01-01 | Discharge: 2023-01-01 | Disposition: A | Discharge status: Home / Self Care | Payer: No Typology Code available for payment source | Source: Ambulatory Visit | Attending: Hematology | Admitting: Hematology

## 2023-01-01 VITALS — BP 123/52 | HR 78 | Temp 99.2°F | Resp 18

## 2023-01-01 DIAGNOSIS — C61 Malignant neoplasm of prostate: Secondary | ICD-10-CM

## 2023-01-01 DIAGNOSIS — D649 Anemia, unspecified: Secondary | ICD-10-CM | POA: Insufficient documentation

## 2023-01-01 DIAGNOSIS — C7951 Secondary malignant neoplasm of bone: Secondary | ICD-10-CM

## 2023-01-01 LAB — OCCULT BLOOD X 1 CARD TO LAB, STOOL
Fecal Occult Bld: POSITIVE — AB
Fecal Occult Bld: POSITIVE — AB
Fecal Occult Bld: POSITIVE — AB

## 2023-01-01 MED ORDER — SODIUM CHLORIDE 0.9 % IV SOLN
510.0000 mg | Freq: Once | INTRAVENOUS | Status: AC
  Administered 2023-01-01: 510 mg via INTRAVENOUS
  Filled 2023-01-01: qty 510

## 2023-01-01 MED ORDER — SODIUM CHLORIDE 0.9 % IV SOLN: Freq: Once | INTRAVENOUS | Status: AC

## 2023-01-01 MED ORDER — LORATADINE 10 MG PO TABS
10.0000 mg | ORAL_TABLET | Freq: Once | ORAL | Status: AC
  Administered 2023-01-01: 10 mg via ORAL
  Filled 2023-01-01: qty 1

## 2023-01-01 MED ORDER — ACETAMINOPHEN 325 MG PO TABS
650.0000 mg | ORAL_TABLET | Freq: Once | ORAL | Status: AC
  Administered 2023-01-01: 650 mg via ORAL
  Filled 2023-01-01: qty 2

## 2023-01-01 NOTE — Progress Notes (Signed)
Pt presents today for Feraheme IV iron infusion per provider's order. Vital signs stable and pt voiced no new complaints at this time.  Peripheral IV started with good blood return pre and post infusion.  Feraheme IV given today per MD orders. Tolerated infusion without adverse affects. Vital signs stable. No complaints at this time. Discharged from clinic via wheelchair in stable condition. Alert and oriented x 3. F/U with [facility] as scheduled.

## 2023-01-01 NOTE — Patient Instructions (Signed)
[facility] [place]  Discharge Instructions: Thank you for choosing [facility] to provide your oncology and hematology care.  If you have a lab appointment with the Cancer Center, please come in thru the Main Entrance and check in at the main information desk.  Wear comfortable clothing and clothing appropriate for easy access to any Portacath or PICC line.   We strive to give you quality time with your provider. You may need to reschedule your appointment if you arrive late (15 or more minutes).  Arriving late affects you and other patients whose appointments are after yours.  Also, if you miss three or more appointments without notifying the office, you may be dismissed from the clinic at the provider's discretion.      For prescription refill requests, have your pharmacy contact our office and allow 72 hours for refills to be completed.    Today you received Feraheme IV iron infusion.   BELOW ARE SYMPTOMS THAT SHOULD BE REPORTED IMMEDIATELY: *FEVER GREATER THAN 100.4 F (38 C) OR HIGHER *CHILLS OR SWEATING *NAUSEA AND VOMITING THAT IS NOT CONTROLLED WITH YOUR NAUSEA MEDICATION *UNUSUAL SHORTNESS OF BREATH *UNUSUAL BRUISING OR BLEEDING *URINARY PROBLEMS (pain or burning when urinating, or frequent urination) *BOWEL PROBLEMS (unusual diarrhea, constipation, pain near the anus) TENDERNESS IN MOUTH AND THROAT WITH OR WITHOUT PRESENCE OF ULCERS (sore throat, sores in mouth, or a toothache) UNUSUAL RASH, SWELLING OR PAIN  UNUSUAL VAGINAL DISCHARGE OR ITCHING   Items with * indicate a potential emergency and should be followed up as soon as possible or go to the Emergency Department if any problems should occur.  Please show the CHEMOTHERAPY ALERT CARD or IMMUNOTHERAPY ALERT CARD at check-in to the Emergency Department and triage nurse.  Should you have questions after your visit or need to cancel or reschedule your appointment, please contact [facility] [place] [phone]  and follow the prompts.  Office hours are 8:00 a.m. to 4:30 p.m. Monday - Friday. Please note that voicemails left after 4:00 p.m. may not be returned until the following business day.  We are closed weekends and major holidays. You have access to a nurse at all times for urgent questions. Please call the main number to the clinic [phone] and follow the prompts.  For any non-urgent questions, you may also contact your provider using MyChart. We now offer e-Visits for anyone 18 and older to request care online for non-urgent symptoms. For details visit [email or web address].   Also download the MyChart app! Go to the app store, search "MyChart", open the app, select [facility], and log in with your MyChart username and password.   

## 2023-01-02 NOTE — Progress Notes (Signed)
Dr. Delton Coombes made aware of results.  No new orders at this time.

## 2023-01-08 ENCOUNTER — Inpatient Hospital Stay: Payer: No Typology Code available for payment source

## 2023-01-08 VITALS — BP 117/55 | HR 78 | Temp 98.5°F | Resp 18

## 2023-01-08 DIAGNOSIS — C7951 Secondary malignant neoplasm of bone: Secondary | ICD-10-CM

## 2023-01-08 DIAGNOSIS — R2231 Localized swelling, mass and lump, right upper limb: Secondary | ICD-10-CM

## 2023-01-08 DIAGNOSIS — C61 Malignant neoplasm of prostate: Secondary | ICD-10-CM

## 2023-01-08 MED ORDER — SODIUM CHLORIDE 0.9 % IV SOLN: Freq: Once | INTRAVENOUS | Status: AC

## 2023-01-08 MED ORDER — CETIRIZINE HCL 10 MG PO TABS
10.0000 mg | ORAL_TABLET | Freq: Once | ORAL | Status: AC
  Administered 2023-01-08: 10 mg via ORAL
  Filled 2023-01-08: qty 1

## 2023-01-08 MED ORDER — SODIUM CHLORIDE 0.9 % IV SOLN
510.0000 mg | Freq: Once | INTRAVENOUS | Status: AC
  Administered 2023-01-08: 510 mg via INTRAVENOUS
  Filled 2023-01-08: qty 510

## 2023-01-08 MED ORDER — ACETAMINOPHEN 325 MG PO TABS
650.0000 mg | ORAL_TABLET | Freq: Once | ORAL | Status: AC
  Administered 2023-01-08: 650 mg via ORAL
  Filled 2023-01-08: qty 2

## 2023-01-08 NOTE — Patient Instructions (Signed)
[facility] [place]  Discharge Instructions: Thank you for choosing [facility] to provide your oncology and hematology care.  If you have a lab appointment with the Cancer Center, please come in thru the Main Entrance and check in at the main information desk.  Wear comfortable clothing and clothing appropriate for easy access to any Portacath or PICC line.   We strive to give you quality time with your provider. You may need to reschedule your appointment if you arrive late (15 or more minutes).  Arriving late affects you and other patients whose appointments are after yours.  Also, if you miss three or more appointments without notifying the office, you may be dismissed from the clinic at the provider's discretion.      For prescription refill requests, have your pharmacy contact our office and allow 72 hours for refills to be completed.    Ferumoxytol Injection What is this medication? FERUMOXYTOL (FER ue MOX i tol) treats low levels of iron in your body (iron deficiency anemia). Iron is a mineral that plays an important role in making red blood cells, which carry oxygen from your lungs to the rest of your body. This medicine may be used for other purposes; ask your health care provider or pharmacist if you have questions. COMMON BRAND NAME(S): Feraheme What should I tell my care team before I take this medication? They need to know if you have any of these conditions: Anemia not caused by low iron levels High levels of iron in the blood Magnetic resonance imaging (MRI) test scheduled An unusual or allergic reaction to iron, other medications, foods, dyes, or preservatives Pregnant or trying to get pregnant Breastfeeding How should I use this medication? This medication is injected into a vein. It is given by your care team in a hospital or clinic setting. Talk to your care team the use of this medication in children. Special care may be needed. Overdosage:  If you think you have taken too much of this medicine contact a poison control center or emergency room at once. NOTE: This medicine is only for you. Do not share this medicine with others. What if I miss a dose? It is important not to miss your dose. Call your care team if you are unable to keep an appointment. What may interact with this medication? Other iron products This list may not describe all possible interactions. Give your health care provider a list of all the medicines, herbs, non-prescription drugs, or dietary supplements you use. Also tell them if you smoke, drink alcohol, or use illegal drugs. Some items may interact with your medicine. What should I watch for while using this medication? Visit your care team regularly. Tell your care team if your symptoms do not start to get better or if they get worse. You may need blood work done while you are taking this medication. You may need to follow a special diet. Talk to your care team. Foods that contain iron include: whole grains/cereals, dried fruits, beans, or peas, leafy green vegetables, and organ meats (liver, kidney). What side effects may I notice from receiving this medication? Side effects that you should report to your care team as soon as possible: Allergic reactions--skin rash, itching, hives, swelling of the face, lips, tongue, or throat Low blood pressure--dizziness, feeling faint or lightheaded, blurry vision Shortness of breath Side effects that usually do not require medical attention (report to your care team if they continue or are bothersome): Flushing Headache   Joint pain Muscle pain Nausea Pain, redness, or irritation at injection site This list may not describe all possible side effects. Call your doctor for medical advice about side effects. You may report side effects to FDA at 1-800-FDA-1088. Where should I keep my medication? This medication is given in a hospital or clinic and will not be stored at  home. NOTE: This sheet is a summary. It may not cover all possible information. If you have questions about this medicine, talk to your doctor, pharmacist, or health care provider.  2023 Elsevier/Gold Standard (2021-04-20 00:00:00)    To help prevent nausea and vomiting after your treatment, we encourage you to take your nausea medication as directed.  BELOW ARE SYMPTOMS THAT SHOULD BE REPORTED IMMEDIATELY: *FEVER GREATER THAN 100.4 F (38 C) OR HIGHER *CHILLS OR SWEATING *NAUSEA AND VOMITING THAT IS NOT CONTROLLED WITH YOUR NAUSEA MEDICATION *UNUSUAL SHORTNESS OF BREATH *UNUSUAL BRUISING OR BLEEDING *URINARY PROBLEMS (pain or burning when urinating, or frequent urination) *BOWEL PROBLEMS (unusual diarrhea, constipation, pain near the anus) TENDERNESS IN MOUTH AND THROAT WITH OR WITHOUT PRESENCE OF ULCERS (sore throat, sores in mouth, or a toothache) UNUSUAL RASH, SWELLING OR PAIN  UNUSUAL VAGINAL DISCHARGE OR ITCHING   Items with * indicate a potential emergency and should be followed up as soon as possible or go to the Emergency Department if any problems should occur.  Please show the CHEMOTHERAPY ALERT CARD or IMMUNOTHERAPY ALERT CARD at check-in to the Emergency Department and triage nurse.  Should you have questions after your visit or need to cancel or reschedule your appointment, please contact [facility] [place] [phone]  and follow the prompts.  Office hours are 8:00 a.m. to 4:30 p.m. Monday - Friday. Please note that voicemails left after 4:00 p.m. may not be returned until the following business day.  We are closed weekends and major holidays. You have access to a nurse at all times for urgent questions. Please call the main number to the clinic [phone] and follow the prompts.  For any non-urgent questions, you may also contact your provider using MyChart. We now offer e-Visits for anyone 18 and older to request care online for non-urgent symptoms. For  details visit [email or web address].   Also download the MyChart app! Go to the app store, search "MyChart", open the app, select , and log in with your MyChart username and password.   

## 2023-01-08 NOTE — Progress Notes (Signed)
Patient presents today for iron infusion.  Patient is in satisfactory condition with no new complaints voiced.  Vital signs are stable.  Patient showed me an area on his R arm that has been present since his iron two weeks ago.  He has a large, hard raised area and significant bruising in the IV site.  MD made aware.  We will send him down for an ultrasound per Dr. Delton Coombes.  We will proceed with iron per MD orders.   Patient tolerated treatment well with no complaints voiced.  Patient left via wheelchair with wife in stable condition.  Vital signs stable at discharge.  Follow up as scheduled.

## 2023-01-11 ENCOUNTER — Ambulatory Visit (HOSPITAL_COMMUNITY)
Admission: RE | Admit: 2023-01-11 | Discharge: 2023-01-11 | Disposition: A | Discharge status: Home / Self Care | Payer: No Typology Code available for payment source | Source: Ambulatory Visit | Attending: Hematology | Admitting: Hematology

## 2023-01-11 DIAGNOSIS — N2889 Other specified disorders of kidney and ureter: Secondary | ICD-10-CM | POA: Diagnosis not present

## 2023-01-11 DIAGNOSIS — K4090 Unilateral inguinal hernia, without obstruction or gangrene, not specified as recurrent: Secondary | ICD-10-CM | POA: Diagnosis not present

## 2023-01-11 DIAGNOSIS — C7951 Secondary malignant neoplasm of bone: Secondary | ICD-10-CM

## 2023-01-11 DIAGNOSIS — C61 Malignant neoplasm of prostate: Secondary | ICD-10-CM

## 2023-01-11 MED ORDER — FLUDEOXYGLUCOSE F - 18 (FDG) INJECTION
20.0000 | Freq: Once | INTRAVENOUS | Status: AC | PRN
  Administered 2023-01-11: 20 via INTRAVENOUS

## 2023-01-11 MED ORDER — IOHEXOL 300 MG/ML  SOLN
100.0000 mL | Freq: Once | INTRAMUSCULAR | Status: AC | PRN
  Administered 2023-01-11: 100 mL via INTRAVENOUS

## 2023-01-12 ENCOUNTER — Ambulatory Visit (INDEPENDENT_AMBULATORY_CARE_PROVIDER_SITE_OTHER): Payer: No Typology Code available for payment source | Admitting: Family Medicine

## 2023-01-12 ENCOUNTER — Encounter: Payer: Self-pay | Admitting: Family Medicine

## 2023-01-12 VITALS — BP 147/46 | HR 86 | Ht 64.0 in | Wt 148.0 lb

## 2023-01-12 DIAGNOSIS — I480 Paroxysmal atrial fibrillation: Secondary | ICD-10-CM | POA: Diagnosis not present

## 2023-01-12 DIAGNOSIS — D6859 Other primary thrombophilia: Secondary | ICD-10-CM

## 2023-01-12 DIAGNOSIS — E119 Type 2 diabetes mellitus without complications: Secondary | ICD-10-CM

## 2023-01-12 DIAGNOSIS — E1159 Type 2 diabetes mellitus with other circulatory complications: Secondary | ICD-10-CM | POA: Diagnosis not present

## 2023-01-12 DIAGNOSIS — E1169 Type 2 diabetes mellitus with other specified complication: Secondary | ICD-10-CM

## 2023-01-12 DIAGNOSIS — Z86718 Personal history of other venous thrombosis and embolism: Secondary | ICD-10-CM | POA: Diagnosis not present

## 2023-01-12 DIAGNOSIS — I152 Hypertension secondary to endocrine disorders: Secondary | ICD-10-CM

## 2023-01-12 DIAGNOSIS — Z7901 Long term (current) use of anticoagulants: Secondary | ICD-10-CM | POA: Diagnosis not present

## 2023-01-12 DIAGNOSIS — K219 Gastro-esophageal reflux disease without esophagitis: Secondary | ICD-10-CM | POA: Diagnosis not present

## 2023-01-12 DIAGNOSIS — E785 Hyperlipidemia, unspecified: Secondary | ICD-10-CM | POA: Diagnosis not present

## 2023-01-12 LAB — COAGUCHEK XS/INR WAIVED
INR: 7.9 (ref 0.9–1.1)
Prothrombin Time: 94.3 s

## 2023-01-12 LAB — BAYER DCA HB A1C WAIVED: HB A1C (BAYER DCA - WAIVED): 5.7 % — ABNORMAL HIGH (ref 4.8–5.6)

## 2023-01-12 MED ORDER — GLUCOSE BLOOD VI STRP: ORAL_STRIP | 4 refills | Status: DC

## 2023-01-12 MED ORDER — POTASSIUM CHLORIDE CRYS ER 20 MEQ PO TBCR: EXTENDED_RELEASE_TABLET | ORAL | 3 refills | Status: DC

## 2023-01-12 MED ORDER — LISINOPRIL 40 MG PO TABS: 40.0000 mg | ORAL_TABLET | Freq: Every day | ORAL | 3 refills | Status: DC

## 2023-01-12 MED ORDER — AMLODIPINE BESYLATE 10 MG PO TABS: 10.0000 mg | ORAL_TABLET | Freq: Every evening | ORAL | 3 refills | Status: DC

## 2023-01-12 MED ORDER — OMEPRAZOLE 20 MG PO CPDR: 20.0000 mg | DELAYED_RELEASE_CAPSULE | Freq: Every day | ORAL | 3 refills | Status: DC

## 2023-01-12 MED ORDER — METOPROLOL SUCCINATE ER 100 MG PO TB24: 100.0000 mg | ORAL_TABLET | Freq: Every day | ORAL | 3 refills | Status: DC

## 2023-01-12 NOTE — Progress Notes (Signed)
BP (!) 147/46   Pulse 86   Ht '5\' 4"'$  (1.626 m)   Wt 148 lb (67.1 kg)   SpO2 97%   BMI 25.40 kg/m    Subjective:   Patient ID: Alan Netters., male    DOB: 30-Jan-1941, 82 y.o.   MRN: [id number]  HPI: Alan Flye. is a 82 y.o. male presenting on 01/12/2023 for Medical Management of Chronic Issues, Diabetes, and Atrial Fibrillation   HPI Coumadin recheck Target goal: 2.0-3.0 Reason on anticoagulation: History of DVT and currently has A-fib Patient denies any bruising or bleeding or chest pain or palpitations   Type 2 diabetes mellitus Patient comes in today for recheck of his diabetes. Patient has been currently taking metformin. Patient is currently on an ACE inhibitor/ARB. Patient has not seen an ophthalmologist this year. Patient denies any issues with their feet. The symptom started onset as an adult hypertension and hyperlipidemia ARE RELATED TO DM   Hypertension Patient is currently on amlodipine and chlorthalidone and lisinopril and metoprolol, and their blood pressure today is 147/46. Patient denies any lightheadedness or dizziness. Patient denies headaches, blurred vision, chest pains, shortness of breath, or weakness. Denies any side effects from medication and is content with current medication.   Hyperlipidemia Patient is coming in for recheck of his hyperlipidemia. The patient is currently taking pravastatin. They deny any issues with myalgias or history of liver damage from it. They deny any focal numbness or weakness or chest pain.   Relevant past medical, surgical, family and social history reviewed and updated as indicated. Interim medical history since our last visit reviewed. Allergies and medications reviewed and updated.  Review of Systems  Constitutional:  Negative for chills and fever.  Eyes:  Negative for visual disturbance.  Respiratory:  Negative for shortness of breath and wheezing.   Cardiovascular:  Negative for chest pain and leg swelling.   Musculoskeletal:  Negative for back pain and gait problem.  Skin:  Negative for rash.  Neurological:  Negative for dizziness, weakness and light-headedness.  All other systems reviewed and are negative.   Per HPI unless specifically indicated above   Allergies as of 01/12/2023       Reactions   Allopurinol Other (See Comments)   Significantly decreased WBC's   Colchicine Other (See Comments)   Significantly decreased WBC's        Medication List        Accurate as of January 12, 2023  3:57 PM. If you have any questions, ask your nurse or doctor.          abiraterone acetate 250 MG tablet Commonly known as: ZYTIGA Take 4 tablets (1,000 mg total) by mouth daily. Take on an empty stomach 1 hour before or 2 hours after a meal   acetaminophen 500 MG tablet Commonly known as: TYLENOL Take 1,000 mg by mouth every 6 (six) hours as needed for moderate pain.   amLODipine 10 MG tablet Commonly known as: NORVASC Take 1 tablet (10 mg total) by mouth every evening.   Blood Pressure Kit 1 each by Does not apply route daily.   CALCIUM 600 + D PO Take 1 tablet by mouth in the morning and at bedtime.   calcium carbonate 1500 (600 Ca) MG Tabs tablet Commonly known as: OSCAL Take by mouth 2 (two) times daily with a meal.   chlorthalidone 25 MG tablet Commonly known as: HYGROTON Take 1 tablet by mouth once daily   diclofenac Sodium 1 % Gel  Commonly known as: VOLTAREN Apply 1 application topically 2 (two) times daily as needed (pain).   ferrous sulfate 325 (65 FE) MG tablet Commonly known as: SV Iron Take 1 tablet (325 mg total) by mouth daily with breakfast.   fluticasone 50 MCG/ACT nasal spray Commonly known as: FLONASE Place 1 spray into both nostrils 2 (two) times daily as needed for allergies or rhinitis.   glucose blood test strip Commonly known as: ONE TOUCH ULTRA TEST Use to check BG once daily.  Dx:  Type 2 DM controlled E11.9   HYDROcodone-acetaminophen  5-325 MG tablet Commonly known as: NORCO/VICODIN Take 1 tablet by mouth every 6 (six) hours as needed for moderate pain. Can take every 4 hours if needed.   lisinopril 40 MG tablet Commonly known as: ZESTRIL Take 1 tablet (40 mg total) by mouth daily.   magnesium oxide 400 (240 Mg) MG tablet Commonly known as: MAG-OX Take 1 tablet (400 mg total) by mouth in the morning, at noon, and at bedtime.   metFORMIN 1000 MG tablet Commonly known as: GLUCOPHAGE Take 1 tablet (1,000 mg total) by mouth 2 (two) times daily with a meal.   metoprolol succinate 100 MG 24 hr tablet Commonly known as: TOPROL-XL Take 1 tablet (100 mg total) by mouth daily. Take with or immediately following a meal.   omeprazole 20 MG capsule Commonly known as: PRILOSEC Take 1 capsule (20 mg total) by mouth daily.   ONE TOUCH ULTRA MINI w/Device Kit 1 each by Does not apply route 2 (two) times daily.   potassium chloride SA 20 MEQ tablet Commonly known as: KLOR-CON M TAKE 1  BY MOUTH ONCE DAILY Strength: 20 mEq   pravastatin 40 MG tablet Commonly known as: PRAVACHOL Take 1 tablet (40 mg total) by mouth every evening.   predniSONE 5 MG tablet Commonly known as: DELTASONE Take 1 tablet (5 mg total) by mouth daily with breakfast.   warfarin 10 MG tablet Commonly known as: Coumadin Take as directed by the anticoagulation clinic. If you are unsure how to take this medication, talk to your nurse or doctor. Original instructions: Take 1 tablet (10 mg total) by mouth daily.         Objective:   BP (!) 147/46   Pulse 86   Ht '5\' 4"'$  (1.626 m)   Wt 148 lb (67.1 kg)   SpO2 97%   BMI 25.40 kg/m   Wt Readings from Last 3 Encounters:  01/12/23 148 lb (67.1 kg)  12/27/22 145 lb (65.8 kg)  12/01/22 144 lb (65.3 kg)    Physical Exam Vitals and nursing note reviewed.  Constitutional:      General: He is not in acute distress.    Appearance: He is well-developed. He is not diaphoretic.  Eyes:     General:  No scleral icterus.    Conjunctiva/sclera: Conjunctivae normal.  Neck:     Thyroid: No thyromegaly.  Cardiovascular:     Rate and Rhythm: Normal rate and regular rhythm.     Heart sounds: Normal heart sounds. No murmur heard. Pulmonary:     Effort: Pulmonary effort is normal. No respiratory distress.     Breath sounds: Normal breath sounds. No wheezing.  Musculoskeletal:        General: No swelling. Normal range of motion.     Cervical back: Neck supple.  Lymphadenopathy:     Cervical: No cervical adenopathy.  Skin:    General: Skin is warm and dry.  Findings: No rash.  Neurological:     Mental Status: He is alert and oriented to person, place, and time.     Coordination: Coordination normal.  Psychiatric:        Behavior: Behavior normal.     Description   Patient just had a iron transfusion, so it is likely falsely elevated, were given for any signs of bleeding and we will have him come back early next week for recheck.  Hold for 2 days and then continue to take 1/2 tablet or 5 mg every Tuesday and then continue 10 mg on the rest of the week.   INR was 7.9(goal 2-3)   Follow-up 1weeks      Assessment & Plan:   Problem List Items Addressed This Visit       Cardiovascular and Mediastinum   Hypertension associated with diabetes ([place])   Relevant Medications   amLODipine (NORVASC) 10 MG tablet   lisinopril (ZESTRIL) 40 MG tablet   metoprolol succinate (TOPROL-XL) 100 MG 24 hr tablet   Paroxysmal atrial fibrillation (HCC)   Relevant Medications   amLODipine (NORVASC) 10 MG tablet   lisinopril (ZESTRIL) 40 MG tablet   metoprolol succinate (TOPROL-XL) 100 MG 24 hr tablet     Digestive   GERD (gastroesophageal reflux disease)   Relevant Medications   omeprazole (PRILOSEC) 20 MG capsule     Endocrine   Hyperlipidemia associated with type 2 diabetes mellitus (HCC)   Relevant Medications   amLODipine (NORVASC) 10 MG tablet   lisinopril (ZESTRIL) 40 MG tablet    metoprolol succinate (TOPROL-XL) 100 MG 24 hr tablet   Non-insulin dependent type 2 diabetes mellitus (HCC)   Relevant Medications   lisinopril (ZESTRIL) 40 MG tablet   potassium chloride SA (KLOR-CON M) 20 MEQ tablet     Hematopoietic and Hemostatic   Primary hypercoagulable state ([place]) [D68.59]     Other   Long term (current) use of anticoagulants [Z79.01] - Primary   Relevant Orders   Bayer DCA Hb A1c Waived   CoaguChek XS/INR Waived   History of DVT (deep vein thrombosis)   Other Visit Diagnoses     Type 2 diabetes mellitus with other specified complication, without long-term current use of insulin (HCC)       Relevant Medications   lisinopril (ZESTRIL) 40 MG tablet   potassium chloride SA (KLOR-CON M) 20 MEQ tablet   Other Relevant Orders   Bayer DCA Hb A1c Waived   CoaguChek XS/INR Waived       1 patient to watch for any signs of bleeding.  Will have him hold for 2 days and then return early next week for repeat INR.  Likely falsely elevated because of transfusion. Follow up plan: Return in about 3 months (around 04/12/2023), or if symptoms worsen or fail to improve, for diabetes and htn and hld.  Counseling provided for all of the vaccine components Orders Placed This Encounter  Procedures   Bayer [facility] Hb A1c Waived   CoaguChek XS/INR [place] Avraj Lindroth, MD [facility] 01/12/2023, 3:57 PM

## 2023-01-15 ENCOUNTER — Encounter: Payer: Self-pay | Admitting: *Deleted

## 2023-01-15 ENCOUNTER — Telehealth: Payer: Self-pay | Admitting: Licensed Clinical Social Worker

## 2023-01-15 ENCOUNTER — Inpatient Hospital Stay: Payer: No Typology Code available for payment source | Attending: Hematology | Admitting: Hematology

## 2023-01-15 ENCOUNTER — Ambulatory Visit (HOSPITAL_COMMUNITY): Admission: RE | Admit: 2023-01-15 | Payer: No Typology Code available for payment source | Source: Ambulatory Visit

## 2023-01-15 ENCOUNTER — Encounter: Payer: Self-pay | Admitting: Licensed Clinical Social Worker

## 2023-01-15 VITALS — BP 132/55 | HR 86 | Temp 99.2°F | Resp 18 | Ht 64.0 in | Wt 148.0 lb

## 2023-01-15 DIAGNOSIS — K861 Other chronic pancreatitis: Secondary | ICD-10-CM | POA: Diagnosis not present

## 2023-01-15 DIAGNOSIS — I422 Other hypertrophic cardiomyopathy: Secondary | ICD-10-CM | POA: Insufficient documentation

## 2023-01-15 DIAGNOSIS — Z8249 Family history of ischemic heart disease and other diseases of the circulatory system: Secondary | ICD-10-CM | POA: Insufficient documentation

## 2023-01-15 DIAGNOSIS — Z8701 Personal history of pneumonia (recurrent): Secondary | ICD-10-CM | POA: Diagnosis not present

## 2023-01-15 DIAGNOSIS — K5730 Diverticulosis of large intestine without perforation or abscess without bleeding: Secondary | ICD-10-CM | POA: Diagnosis not present

## 2023-01-15 DIAGNOSIS — Z87891 Personal history of nicotine dependence: Secondary | ICD-10-CM | POA: Diagnosis not present

## 2023-01-15 DIAGNOSIS — N401 Enlarged prostate with lower urinary tract symptoms: Secondary | ICD-10-CM | POA: Diagnosis not present

## 2023-01-15 DIAGNOSIS — Z86010 Personal history of colonic polyps: Secondary | ICD-10-CM | POA: Insufficient documentation

## 2023-01-15 DIAGNOSIS — Z8719 Personal history of other diseases of the digestive system: Secondary | ICD-10-CM | POA: Insufficient documentation

## 2023-01-15 DIAGNOSIS — Z7952 Long term (current) use of systemic steroids: Secondary | ICD-10-CM | POA: Insufficient documentation

## 2023-01-15 DIAGNOSIS — Z823 Family history of stroke: Secondary | ICD-10-CM | POA: Insufficient documentation

## 2023-01-15 DIAGNOSIS — Z86718 Personal history of other venous thrombosis and embolism: Secondary | ICD-10-CM | POA: Diagnosis not present

## 2023-01-15 DIAGNOSIS — E119 Type 2 diabetes mellitus without complications: Secondary | ICD-10-CM | POA: Insufficient documentation

## 2023-01-15 DIAGNOSIS — K449 Diaphragmatic hernia without obstruction or gangrene: Secondary | ICD-10-CM | POA: Diagnosis not present

## 2023-01-15 DIAGNOSIS — Z7901 Long term (current) use of anticoagulants: Secondary | ICD-10-CM | POA: Insufficient documentation

## 2023-01-15 DIAGNOSIS — K219 Gastro-esophageal reflux disease without esophagitis: Secondary | ICD-10-CM | POA: Insufficient documentation

## 2023-01-15 DIAGNOSIS — M533 Sacrococcygeal disorders, not elsewhere classified: Secondary | ICD-10-CM | POA: Insufficient documentation

## 2023-01-15 DIAGNOSIS — K921 Melena: Secondary | ICD-10-CM | POA: Insufficient documentation

## 2023-01-15 DIAGNOSIS — I480 Paroxysmal atrial fibrillation: Secondary | ICD-10-CM | POA: Insufficient documentation

## 2023-01-15 DIAGNOSIS — Z9049 Acquired absence of other specified parts of digestive tract: Secondary | ICD-10-CM | POA: Insufficient documentation

## 2023-01-15 DIAGNOSIS — Z803 Family history of malignant neoplasm of breast: Secondary | ICD-10-CM | POA: Insufficient documentation

## 2023-01-15 DIAGNOSIS — Z79899 Other long term (current) drug therapy: Secondary | ICD-10-CM | POA: Diagnosis not present

## 2023-01-15 DIAGNOSIS — Z808 Family history of malignant neoplasm of other organs or systems: Secondary | ICD-10-CM | POA: Insufficient documentation

## 2023-01-15 DIAGNOSIS — Z818 Family history of other mental and behavioral disorders: Secondary | ICD-10-CM | POA: Insufficient documentation

## 2023-01-15 DIAGNOSIS — C61 Malignant neoplasm of prostate: Secondary | ICD-10-CM | POA: Diagnosis not present

## 2023-01-15 DIAGNOSIS — I10 Essential (primary) hypertension: Secondary | ICD-10-CM | POA: Insufficient documentation

## 2023-01-15 DIAGNOSIS — C7951 Secondary malignant neoplasm of bone: Secondary | ICD-10-CM | POA: Diagnosis not present

## 2023-01-15 DIAGNOSIS — D649 Anemia, unspecified: Secondary | ICD-10-CM | POA: Diagnosis not present

## 2023-01-15 NOTE — Progress Notes (Signed)
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  PRIOR THERAPY: Erleada from 02/08/2022 through 10/03/2022  NGS Results: not done  CURRENT THERAPY: Firmagon and Zytiga  BRIEF ONCOLOGIC HISTORY:  Oncology History  Prostate cancer ([place])  04/13/2014 Initial Diagnosis   Prostate cancer ([place])   06/25/2020 Cancer Staging   Staging form: Prostate, AJCC 8th Edition - Clinical stage from 06/25/2020: Stage IIC (cT2b, cN0, cM0, PSA: 12.8, Grade Group: 3) - Signed by Alan Caldron, PA-C on 09/29/2020     CANCER STAGING:  Cancer Staging  Prostate cancer [facility]) Staging form: Prostate, AJCC 8th Edition - Clinical stage from 06/25/2020: Stage IIC (cT2b, cN0, cM0, PSA: 12.8, Grade Group: 3) - Signed by Alan Caldron, PA-C on 09/29/2020 - Pathologic stage from 01/26/2022: Stage IVB (pT2, pN0, pM1b, Grade Group: 3) - Unsigned   INTERVAL HISTORY:  [name], a 82 y.o. 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 He is taking Abiraterone and prednisone. He was last seen by me on 12/27/22. He is accompanied by his wife and son today. We discussed his lab work from today and increased PSA.  Today, he states that he is doing okay overall. He feels much better after his Feraheme infusions. He reports blood in his stools, typically dark in color. His appetite level is at 100%. His energy level is at 70%. Patient's son is with him most of the time to help him with his ADLs.  He is still taking MagOx TID.  He has not had any genetic testing done.   REVIEW OF SYSTEMS:  Review of Systems  Constitutional:  Negative for chills, fatigue and fever.  HENT:   Negative for lump/mass, mouth sores, nosebleeds, sore throat and trouble swallowing.   Respiratory:  Negative for cough and  shortness of breath.   Cardiovascular:  Negative for chest pain, leg swelling and palpitations.  Gastrointestinal:  Positive for blood in stool, constipation and diarrhea. Negative for abdominal pain, nausea and vomiting.  Genitourinary:  Negative for bladder incontinence, difficulty urinating, dysuria, frequency, hematuria and nocturia.   Musculoskeletal:  Negative for arthralgias, back pain, flank pain, myalgias and neck pain.  Skin:  Negative for itching and rash.  Neurological:  Negative for dizziness, headaches and numbness.       + tingling left foot  Hematological:  Does not bruise/bleed easily.  Psychiatric/Behavioral:  Negative for depression, sleep disturbance and suicidal ideas. The patient is not nervous/anxious.   All other systems reviewed and are negative.   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   found during hospital stay with pancreatitis,  started coumadin;   last Abd CT in epic 07-29-2020  chronic thrombus portal venous   GERD (gastroesophageal reflux disease)    Heart murmur    History of acute pancreatitis    2016   necrotizing pancreatitis   History of adenomatous polyp of colon    History of DVT of lower extremity  History of GI bleed 08/2019   upper gi bleed due to duodenal ulcer   History of gout yrs ago   Hypertension    followed by pcp   Hypertrophic cardiomyopathy ([place])    followed by cardiology   Internal hemorrhoids    Mixed hyperlipidemia    Normocytic anemia    PAF (paroxysmal atrial fibrillation) ([place]) 04/2016   cardiologist-- Alan [name]--  event monitor 07-04-2017 epic, NSR/ PAflutter with occasional RVR/  PACs/ PVCs ;  echo 05-16-2017 epic,  moderate LVH with  severe asymptomic septal hypertrophy, G1DD, EF 60-65%,  mild AR, mild LAE   Pancreatic pseudocyst    Peyronie's disease    Prostate cancer [facility]) urologist-- Alan [name]/ oncologist-- Alan Alan [name]   first dx 08/ 2009 Gleason 3+3 active survelliance;  until bx 07/ 2021  Stage T2b, Gleason 4+3   Right inguinal hernia    Type 2 diabetes mellitus ([place])    followed by pcp---  (11-03-2020 per pt currently not checking blood sugar due to glucose monitor broken)   Wears glasses    Wears partial dentures    upper and lower   Past Surgical History:  Procedure Laterality Date   BIOPSY  02/09/2022   Procedure: BIOPSY;  Surgeon: Milus Banister, MD;  Location: [facility];  Service: Endoscopy;;   CHOLECYSTECTOMY N/A 11/26/2015   Procedure: LAPAROSCOPIC CHOLECYSTECTOMY;  Surgeon: Aviva Signs, MD;  Location: [facility];  Service: General;  Laterality: N/A;   COLONOSCOPY  last one 09-06-2020  Alan [name]   CYSTOSCOPY N/A 11/09/2020   Procedure: Erlene Quan;  Surgeon: Irine Seal, MD;  Location: [facility];  Service: Urology;  Laterality: N/A;   ESOPHAGOGASTRODUODENOSCOPY  10-06-2019  '@[facility]'$    ESOPHAGOGASTRODUODENOSCOPY N/A 02/09/2022   Procedure: ESOPHAGOGASTRODUODENOSCOPY (EGD);  Surgeon: Milus Banister, MD;  Location: [facility];  Service: Endoscopy;  Laterality: N/A;   EUS N/A 02/09/2022   Procedure: UPPER ENDOSCOPIC ULTRASOUND (EUS) RADIAL;  Surgeon: Milus Banister, MD;  Location: [facility];  Service: Endoscopy;  Laterality: N/A;   FLEXIBLE BRONCHOSCOPY Bilateral 04/14/2016   Procedure: FLEXIBLE BRONCHOSCOPY;  Surgeon: Sinda Du, MD;  [facility];  Service: Cardiopulmonary;  Laterality: Bilateral;   HEMORRHOID SURGERY  yrs ago   RADIOACTIVE SEED IMPLANT N/A 11/09/2020   Procedure: RADIOACTIVE SEED IMPLANT/BRACHYTHERAPY IMPLANT;  Surgeon: Irine Seal, MD;  Location: [facility];  Service: Urology;  Laterality: N/A;   SPACE OAR INSTILLATION N/A 11/09/2020    Procedure: SPACE OAR INSTILLATION;  Surgeon: Irine Seal, MD;  Location: [facility];  Service: Urology;  Laterality: N/A;   UMBILICAL HERNIA REPAIR N/A 11/26/2015   Procedure: UMBILICAL HERNIORRHAPHY;  Surgeon: Aviva Signs, MD;  Location: [facility];  Service: General;  Laterality: N/A;    SOCIAL HISTORY:  Social History   Socioeconomic History   Marital status: Married    Spouse name: [name]   Number of children: 4   Years of education: Not on file   Highest education level: 10th grade  Occupational History   Occupation: Retired     Fish farm manager: [employer]  Tobacco Use   Smoking status: Former    Years: 5.00    Types: Cigarettes    Quit date: 12/11/1986    Years since quitting: 36.1   Smokeless tobacco: Never  Vaping Use   Vaping Use: Never used  Substance and Sexual Activity   Alcohol use: No   Drug use: Never   Sexual activity: Yes  Other Topics Concern  Not on file  Social History Narrative   Not on file   Social Determinants of Health   Financial Resource Strain: Low Risk  (08/12/2021)   Overall Financial Resource Strain (CARDIA)    Difficulty of Paying Living Expenses: Not hard at all  Food Insecurity: No Food Insecurity (08/12/2021)   Hunger Vital Sign    Worried About Running Out of Food in the Last Year: Never true    Ran Out of Food in the Last Year: Never true  Transportation Needs: No Transportation Needs (08/12/2021)   PRAPARE - Hydrologist (Medical): No    Lack of Transportation (Non-Medical): No  Physical Activity: Inactive (10/25/2021)   Exercise Vital Sign    Days of Exercise per Week: 0 days    Minutes of Exercise per Session: 0 min  Stress: Stress Concern Present (10/25/2021)   [place]    Feeling of Stress : To some extent  Social Connections: Socially Integrated (08/12/2021)   Social Connection and Isolation Panel [NHANES]    Frequency of  Communication with Friends and Family: Three times a week    Frequency of Social Gatherings with Friends and Family: Three times a week    Attends Religious Services: More than 4 times per year    Active Member of Clubs or Organizations: Yes    Attends Archivist Meetings: More than 4 times per year    Marital Status: Married  Human resources officer Violence: Not At Risk (08/12/2021)   Humiliation, Afraid, Rape, and Kick questionnaire    Fear of Current or Ex-Partner: No    Emotionally Abused: No    Physically Abused: No    Sexually Abused: No    FAMILY HISTORY:  Family History  Problem Relation Age of Onset   Stroke Mother    Hypertension Mother    Alzheimer's disease Mother    Hypertension Father    Brain cancer Sister    Breast cancer Sister    Hypertension Sister    Deep vein thrombosis Brother    Hypertension Brother    Colon cancer Neg Hx    Pancreatic disease Neg Hx    Esophageal cancer Neg Hx    Rectal cancer Neg Hx    Stomach cancer Neg Hx     CURRENT MEDICATIONS:  Current Outpatient Medications  Medication Sig Dispense Refill   abiraterone acetate (ZYTIGA) 250 MG tablet Take 4 tablets (1,000 mg total) by mouth daily. Take on an empty stomach 1 hour before or 2 hours after a meal 120 tablet 3   acetaminophen (TYLENOL) 500 MG tablet Take 1,000 mg by mouth every 6 (six) hours as needed for moderate pain.     amLODipine (NORVASC) 10 MG tablet Take 1 tablet (10 mg total) by mouth every evening. 90 tablet 3   Blood Glucose Monitoring Suppl (ONE TOUCH ULTRA MINI) w/Device KIT 1 each by Does not apply route 2 (two) times daily. 1 kit 1   Blood Pressure KIT 1 each by Does not apply route daily. 1 kit 0   Calcium Carb-Cholecalciferol (CALCIUM 600 + D PO) Take 1 tablet by mouth in the morning and at bedtime.     calcium carbonate (OSCAL) 1500 (600 Ca) MG TABS tablet Take by mouth 2 (two) times daily with a meal.     chlorthalidone (HYGROTON) 25 MG tablet Take 1 tablet by  mouth once daily 90 tablet 3   diclofenac Sodium (VOLTAREN)  1 % GEL Apply 1 application topically 2 (two) times daily as needed (pain).     ferrous sulfate (SV IRON) 325 (65 FE) MG tablet Take 1 tablet (325 mg total) by mouth daily with breakfast. 90 tablet 1   fluticasone (FLONASE) 50 MCG/ACT nasal spray Place 1 spray into both nostrils 2 (two) times daily as needed for allergies or rhinitis. 16 g 6   glucose blood (ONE TOUCH ULTRA TEST) test strip Use to check BG once daily.  Dx:  Type 2 DM controlled E11.9 100 each 4   HYDROcodone-acetaminophen (NORCO/VICODIN) 5-325 MG tablet Take 1 tablet by mouth every 6 (six) hours as needed for moderate pain. Can take every 4 hours if needed. 30 tablet 0   lisinopril (ZESTRIL) 40 MG tablet Take 1 tablet (40 mg total) by mouth daily. 90 tablet 3   magnesium oxide (MAG-OX) 400 (240 Mg) MG tablet Take 1 tablet (400 mg total) by mouth in the morning, at noon, and at bedtime. 90 tablet 6   metFORMIN (GLUCOPHAGE) 1000 MG tablet Take 1 tablet (1,000 mg total) by mouth 2 (two) times daily with a meal. 180 tablet 3   metoprolol succinate (TOPROL-XL) 100 MG 24 hr tablet Take 1 tablet (100 mg total) by mouth daily. Take with or immediately following a meal. 90 tablet 3   omeprazole (PRILOSEC) 20 MG capsule Take 1 capsule (20 mg total) by mouth daily. 90 capsule 3   potassium chloride SA (KLOR-CON M) 20 MEQ tablet TAKE 1  BY MOUTH ONCE DAILY Strength: 20 mEq 90 tablet 3   pravastatin (PRAVACHOL) 40 MG tablet Take 1 tablet (40 mg total) by mouth every evening. 90 tablet 3   predniSONE (DELTASONE) 5 MG tablet Take 1 tablet (5 mg total) by mouth daily with breakfast. 90 tablet 2   warfarin (COUMADIN) 10 MG tablet Take 1 tablet (10 mg total) by mouth daily. 90 tablet 3   No current facility-administered medications for this visit.    ALLERGIES:  Allergies  Allergen Reactions   Allopurinol Other (See Comments)    Significantly decreased WBC's   Colchicine Other (See  Comments)    Significantly decreased WBC's    PHYSICAL EXAM:  Performance status (ECOG): 1 - Symptomatic but completely ambulatory  Vitals:   01/15/23 1406  BP: (!) 132/55  Pulse: 86  Resp: 18  Temp: 99.2 F (37.3 C)  SpO2: 100%    Wt Readings from Last 3 Encounters:  01/15/23 67.1 kg (148 lb)  01/12/23 67.1 kg (148 lb)  12/27/22 65.8 kg (145 lb)   Physical Exam Vitals and nursing note reviewed. Exam conducted with a chaperone present.  Constitutional:      Appearance: Normal appearance.  Cardiovascular:     Rate and Rhythm: Normal rate and regular rhythm.     Pulses: Normal pulses.     Heart sounds: Normal heart sounds.  Pulmonary:     Effort: Pulmonary effort is normal.     Breath sounds: Normal breath sounds.  Abdominal:     Palpations: Abdomen is soft. There is no hepatomegaly, splenomegaly or mass.     Tenderness: There is no abdominal tenderness.  Lymphadenopathy:     Cervical: No cervical adenopathy.     Right cervical: No superficial cervical adenopathy.    Left cervical: No superficial cervical adenopathy.  Skin:    General: Skin is warm and dry.     Comments: 1cm nodule radial side of the right wrist  Neurological:  General: No focal deficit present.     Mental Status: He is alert and oriented to person, place, and time.  Psychiatric:        Mood and Affect: Mood normal.        Behavior: Behavior normal.      LABORATORY DATA:  I have reviewed the labs as listed.     Latest Ref Rng & Units 12/26/2022    1:26 PM 11/29/2022   10:19 AM 10/31/2022   11:10 AM  CBC  WBC 4.0 - 10.5 K/uL 5.1  5.8  4.4   Hemoglobin 13.0 - 17.0 g/dL 6.2  8.1  8.1   Hematocrit 39.0 - 52.0 % 20.5  25.5  26.7   Platelets 150 - 400 K/uL 213  189  234       Latest Ref Rng & Units 12/26/2022    1:26 PM 11/29/2022   10:19 AM 10/31/2022   11:10 AM  CMP  Glucose 70 - 99 mg/dL 236  210  165   BUN 8 - 23 mg/dL 41  18  18   Creatinine 0.61 - 1.24 mg/dL 1.28  1.05  1.09    Sodium 135 - 145 mmol/L 133  132  137   Potassium 3.5 - 5.1 mmol/L 2.9  3.6  3.2   Chloride 98 - 111 mmol/L 98  96  101   CO2 22 - 32 mmol/L '20  24  27   '$ Calcium 8.9 - 10.3 mg/dL 9.2  8.9  9.1   Total Protein 6.5 - 8.1 g/dL 6.2  6.7  6.9   Total Bilirubin 0.3 - 1.2 mg/dL 0.5  0.7  0.5   Alkaline Phos 38 - 126 U/L 100  77  59   AST 15 - 41 U/L 36  24  20   ALT 0 - 44 U/L '16  6  9    '$ Lab Results  Component Value Date   PSA1 382.0 (H) 01/12/2022   PSA1 66.7 (H) 11/22/2021   PSA1 53.2 (H) 11/17/2021   PSA 6.4 (H) 05/20/2020   PSA 2.0 11/12/2014      DIAGNOSTIC IMAGING:  I have independently reviewed the scans and discussed with the patient. CT Abdomen Pelvis W Contrast  Result Date: 01/11/2023 CLINICAL DATA:  Metastatic prostate cancer on medical therapy. Melena and decreased energy for 2 weeks. Restaging. * Tracking Code: BO * EXAM: CT ABDOMEN AND PELVIS WITH CONTRAST TECHNIQUE: Multidetector CT imaging of the abdomen and pelvis was performed using the standard protocol following bolus administration of intravenous contrast. RADIATION DOSE REDUCTION: This exam was performed according to the departmental dose-optimization program which includes automated exposure control, adjustment of the mA and/or kV according to patient size and/or use of iterative reconstruction technique. CONTRAST:  128m OMNIPAQUE IOHEXOL 300 MG/ML  SOLN COMPARISON:  09/28/2022 CT abdomen/pelvis. FINDINGS: Lower chest: No significant pulmonary nodules or acute consolidative airspace disease. Hepatobiliary: Normal liver size. No liver mass. Cholecystectomy. No biliary ductal dilatation. 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 Thick-walled hypodense 2.8 x 2.3 cm lesion abutting the proximal lesser curvature of the stomach and contiguous with the  superior pancreatic body (series 2/image 20), previously 2.9 x 2.2 cm using similar measurement technique, not appreciably changed. Spleen: Normal size. No mass. Adrenals/Urinary Tract: Stable adrenals without discrete adrenal nodules. No hydronephrosis. Subcentimeter hypodense left renal cortical lesions  are too small to characterize and are unchanged, for which no follow-up imaging is recommended. Normal bladder. Stomach/Bowel: Small hiatal hernia. Chronic perigastric varices. Chronic very large right inguinal hernia containing much of the small bowel. No small bowel wall thickening, pneumatosis or dilation. Normal appendix. Moderate left colonic diverticulosis with no large bowel wall thickening or significant pericolonic fat stranding. Vascular/Lymphatic: Atherosclerotic nonaneurysmal abdominal aorta. Patent hepatic and renal veins. 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 Chronic prominent left retroperitoneal venous varices. No pathologically enlarged lymph nodes in the abdomen or pelvis. Reproductive: Stable borderline prominent prostate with brachytherapy seeds. Other: No pneumoperitoneum. Trace ascites in the right inguinal hernia, unchanged. No new focal fluid collections. Musculoskeletal: Patchy confluent sclerotic osseous metastases throughout the visualized thoracolumbar spine, bilateral ribs and bilateral pelvic girdle, not appreciably changed. No appreciable new focal osseous lesions. Marked thoracolumbar spondylosis. IMPRESSION: 1. Stable CT. Widespread sclerotic osseous metastases are not appreciably changed. No evidence of extraosseous metastatic disease in the abdomen or pelvis. 2. Chronic atrophy of and duct dilation within the pancreatic body and tail, stable back to 2021 CT, compatible with chronic pancreatitis as previously reported. 3. Thick-walled hypodense 2.8 x 2.3 cm lesion  abutting the proximal lesser curvature of the stomach and contiguous with the superior pancreatic body, not appreciably changed, favoring a chronic pancreatic pseudocyst as previously reported. 4. 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 Chronic prominent left retroperitoneal venous varices. 5. Chronic very large right inguinal hernia containing much of the small bowel. Stable trace ascites in the right inguinal hernia. No evidence of bowel obstruction or acute bowel inflammation. 6. Small hiatal hernia. 7. Moderate left colonic diverticulosis. 8.  Aortic Atherosclerosis (ICD10-I70.0). Electronically Signed   By: Ilona Sorrel M.D.   On: 01/11/2023 18:41   NM Bone Scan Whole Body  Result Date: 01/11/2023 CLINICAL DATA:  Metastatic prostate cancer assess treatment response EXAM: NUCLEAR MEDICINE WHOLE BODY BONE SCAN TECHNIQUE: Whole body anterior and posterior images were obtained approximately 3 hours after intravenous injection of radiopharmaceutical. RADIOPHARMACEUTICALS:  20 mCi Technetium-27mMDP IV COMPARISON:  09/20/2022 Correlation: CT abdomen and pelvis 01/11/2023 FINDINGS: Multiple sites of abnormal osseous tracer uptake consistent with osseous metastatic disease, progressive since previous exam. Sites include anterior and posterior ribs bilaterally, sternum, thoracic spine, and pelvis. Additional foci of uptake are seen at the RIGHT humeral head, proximal LEFT humerus, and proximal femora bilaterally. Very large RIGHT inguinal hernia. Otherwise expected urinary tract and soft tissue distribution of tracer. IMPRESSION: Progressive osseous metastatic disease since 09/20/2022. Electronically Signed   By: MLavonia DanaM.D.   On: 01/11/2023 17:33     ASSESSMENT:  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  He has been on  finasteride. - Biopsy on 12/24/2013: Right lateral mid-prostatic adenocarcinoma, Gleason 3+3=6 - Biopsy on 06/25/2020: Prostatic adenocarcinoma, T2b, Gleason 4+3=7, PSA 12.8 (adjusted for finasteride) - Bone scan on 07/06/2020 and CT on 07/29/2020 negative. - 11/09/2020: Insertion of radioactive I-125 seeds - PSA 5.4 (6/22), 53.2 (11/17/2021), 66.7 (11/22/2021), 382 (01/12/2022) - CTAP on 11/25/2021 with no evidence of metastatic disease.  New ill-defined soft tissue adjacent to the pancreatic body encasing the proximal splenic artery, sequela of interval pancreatitis although neoplasm is considered.  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 - Bone scan on 12/08/2021  with possible uptake in the lower thoracic vertebral body.  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 - CT chest without contrast on 01/03/2022 with no CT correlate in the lower thoracic spine.  No additional metastatic disease. - Degarelix  240 mg by Alan. Jeffie Pollock on 01/12/2022. - PSMA PET scan on 01/30/2022: Multiple skeletal metastasis in the axillary and appendicular skeleton with nearly every vertebral body involved with carcinoma.  Broad lesions in the sacrum and pelvis.  Focal activity in the anterior apical region of the prostate gland.  No metastatic adenopathy or visceral metastasis. [place] 3 tablets daily started around 02/08/2022, dose increased to 4 tablets daily on 03/21/2022. -He was hospitalized from 03/12/2022 through 03/14/2022 with pneumonia.  I have reviewed hospitalization records.  He reportedly fell on the night of 03/14/2022 when he lost balance while hanging his robe. - Erleada dose reduced to 3 tablets daily on 04/10/2022 due to diarrhea and rash.  Discontinued on 10/03/2022 due to progression.  Abiraterone and prednisone started on 10/05/2022, discontinued on 01/15/2023 due to progression.   Social/family history: - Lives with wife and granddaughter at home.  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  Quit smoking 50 years ago. - 2 sisters had brain cancer.  Maternal grandmother had cancer.  Niece had breast cancer.   PLAN:  Metastatic CSPC to the bones: - He is taking Abiraterone and prednisone. - We reviewed CT [facility] (01/11/2023): Stable bone metastasis.  No evidence of visceral metastatic disease.  Other benign findings discussed. - Bone scan (01/11/2023): Progressive bone metastatic disease since October. - He is not a candidate for chemotherapy. - Germline mutation testing was negative in 2023. - We have recommended Guardant360 to be sent today.  We have also recommended NGS testing on prostate biopsy from July 2021. - Recommend follow-up in 4 weeks to discuss results.  Will check PSA at that time.  2.  Bone metastasis: - Continue denosumab monthly.  3.  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  4.  Normocytic anemia: - He has occasional blood in the stool. - He has received 1 unit PRBC.  He also received Feraheme x 2 on 01/01/2023 and 01/08/2023. - He feels much better after the Feraheme infusion.  Will check his CBC at next visit.  5.  Hypomagnesemia: - Continue magnesium 3 times daily.  Last magnesium is normal.   Orders placed this encounter:  Orders Placed This Encounter  Procedures   CBC with Differential/Platelet   Comprehensive metabolic panel   Magnesium   PSA     I,Alexis Herring,acting as a scribe for Derek Jack, MD.,have documented all relevant documentation on the behalf of Derek Jack, MD,as directed by  Derek Jack, MD while in the presence of Derek Jack, MD.  I, Derek Jack MD, have reviewed the above documentation for accuracy and completeness, and I agree with the above.   Derek Jack, MD [place] [phone]

## 2023-01-15 NOTE — Patient Instructions (Addendum)
[place]  Discharge Instructions  You were seen and examined today by Dr. Delton Coombes.  Dr. Delton Coombes discussed your most recent lab work which revealed that your bone scan shows progression.  Dr. Delton Coombes is ordering further labs today to see if your cancer is susceptible to another type of pill.  Follow-up as scheduled in 4 weeks with labs.    Thank you for choosing [facility] to provide your oncology and hematology care.   To afford each patient quality time with our provider, please arrive at least 15 minutes before your scheduled appointment time. You may need to reschedule your appointment if you arrive late (10 or more minutes). Arriving late affects you and other patients whose appointments are after yours.  Also, if you miss three or more appointments without notifying the office, you may be dismissed from the clinic at the provider's discretion.    Again, thank you for choosing [facility].  Our hope is that these requests will decrease the amount of time that you wait before being seen by our physicians.   If you have a lab appointment with the [facility] please come in thru the Main Entrance and check in at the main information desk.           _____________________________________________________________  Should you have questions after your visit to [facility], please contact our office at [phone] and follow the prompts.  Our office hours are 8:00 a.m. to 4:30 p.m. Monday - Thursday and 8:00 a.m. to 2:30 p.m. Friday.  Please note that voicemails left after 4:00 p.m. may not be returned until the following business day.  We are closed weekends and all major holidays.  You do have access to a nurse 24-7, just call the main number to the clinic [phone] and do not press any options, hold on the line and a nurse will answer the phone.    For prescription refill requests, have your  pharmacy contact our office and allow 72 hours.    Masks are optional in the cancer centers. If you would like for your care team to wear a mask while they are taking care of you, please let them know. You may have one support person who is at least 80 years old accompany you for your appointments.

## 2023-01-15 NOTE — Telephone Encounter (Signed)
Placed order for [facility] STAT genetic test today 01/15/2023 per Morgan's request.

## 2023-01-16 NOTE — Progress Notes (Signed)
Papers for [name] (Certification of Health Care Provider for [facility]) completed and faxed with confirmation.  Copy mailed to son per his request.  Fax no. [phone]

## 2023-01-17 ENCOUNTER — Other Ambulatory Visit (HOSPITAL_COMMUNITY): Payer: Self-pay

## 2023-01-17 ENCOUNTER — Encounter: Payer: Self-pay | Admitting: Family Medicine

## 2023-01-17 ENCOUNTER — Ambulatory Visit: Payer: No Typology Code available for payment source | Admitting: Family Medicine

## 2023-01-17 VITALS — Ht 64.0 in

## 2023-01-17 DIAGNOSIS — I4891 Unspecified atrial fibrillation: Secondary | ICD-10-CM

## 2023-01-17 DIAGNOSIS — D6859 Other primary thrombophilia: Secondary | ICD-10-CM | POA: Diagnosis not present

## 2023-01-17 DIAGNOSIS — Z7901 Long term (current) use of anticoagulants: Secondary | ICD-10-CM | POA: Diagnosis not present

## 2023-01-17 DIAGNOSIS — Z86718 Personal history of other venous thrombosis and embolism: Secondary | ICD-10-CM

## 2023-01-17 NOTE — Progress Notes (Signed)
Ht '5\' 4"'$  (1.626 m)   BMI 25.40 kg/m    Subjective:   Patient ID: Alan Netters., male    DOB: Jul 04, 1941, 82 y.o.   MRN: [id number]  HPI: Alan [name]. is a 82 y.o. male presenting on 01/17/2023 for Medical Management of Chronic Issues and Atrial Fibrillation   HPI Coumadin recheck Target goal: 2.0-3.0 Reason on anticoagulation: A-fib and history of DVT Patient denies any bruising or bleeding or chest pain or palpitations   Relevant past medical, surgical, family and social history reviewed and updated as indicated. Interim medical history since our last visit reviewed. Allergies and medications reviewed and updated.  Review of Systems  Constitutional:  Negative for chills and fever.  Respiratory:  Negative for shortness of breath and wheezing.   Cardiovascular:  Negative for chest pain and leg swelling.  Gastrointestinal:  Negative for blood in stool.  Genitourinary:  Negative for hematuria.  Musculoskeletal:  Negative for back pain and gait problem.  Skin:  Negative for rash.  All other systems reviewed and are negative.   Per HPI unless specifically indicated above   Allergies as of 01/17/2023       Reactions   Allopurinol Other (See Comments)   Significantly decreased WBC's   Colchicine Other (See Comments)   Significantly decreased WBC's        Medication List        Accurate as of January 17, 2023  4:29 PM. If you have any questions, ask your nurse or doctor.          abiraterone acetate 250 MG tablet Commonly known as: ZYTIGA Take 4 tablets (1,000 mg total) by mouth daily. Take on an empty stomach 1 hour before or 2 hours after a meal   acetaminophen 500 MG tablet Commonly known as: TYLENOL Take 1,000 mg by mouth every 6 (six) hours as needed for moderate pain.   amLODipine 10 MG tablet Commonly known as: NORVASC Take 1 tablet (10 mg total) by mouth every evening.   Blood Pressure Kit 1 each by Does not apply route daily.   CALCIUM 600 + D  PO Take 1 tablet by mouth in the morning and at bedtime.   calcium carbonate 1500 (600 Ca) MG Tabs tablet Commonly known as: OSCAL Take by mouth 2 (two) times daily with a meal.   chlorthalidone 25 MG tablet Commonly known as: HYGROTON Take 1 tablet by mouth once daily   diclofenac Sodium 1 % Gel Commonly known as: VOLTAREN Apply 1 application topically 2 (two) times daily as needed (pain).   ferrous sulfate 325 (65 FE) MG tablet Commonly known as: SV Iron Take 1 tablet (325 mg total) by mouth daily with breakfast.   fluticasone 50 MCG/ACT nasal spray Commonly known as: FLONASE Place 1 spray into both nostrils 2 (two) times daily as needed for allergies or rhinitis.   glucose blood test strip Commonly known as: ONE TOUCH ULTRA TEST Use to check BG once daily.  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 Can take every 4 hours if needed.   lisinopril 40 MG tablet Commonly known as: ZESTRIL Take 1 tablet (40 mg total) by mouth daily.   magnesium oxide 400 (240 Mg) MG tablet Commonly known as: MAG-OX Take 1 tablet (400 mg total) by mouth in the morning, at noon, and at bedtime.  metFORMIN 1000 MG tablet Commonly known as: GLUCOPHAGE Take 1 tablet (1,000 mg total) by mouth 2 (two) times daily with a meal.   metoprolol succinate 100 MG 24 hr tablet Commonly known as: TOPROL-XL Take 1 tablet (100 mg total) by mouth daily. Take with or immediately following a meal.   omeprazole 20 MG capsule Commonly known as: PRILOSEC Take 1 capsule (20 mg total) by mouth daily.   ONE TOUCH ULTRA MINI w/Device Kit 1 each by Does not apply route 2 (two) times daily.   potassium chloride SA 20 MEQ tablet Commonly known as: KLOR-CON M TAKE 1  BY MOUTH ONCE DAILY Strength: 20 mEq   pravastatin 40 MG tablet Commonly known as: PRAVACHOL Take 1 tablet  (40 mg total) by mouth every evening.   predniSONE 5 MG tablet Commonly known as: DELTASONE Take 1 tablet (5 mg total) by mouth daily with breakfast.   warfarin 10 MG tablet Commonly known as: Coumadin Take as directed by the anticoagulation clinic. If you are unsure how to take this medication, talk to your nurse or doctor. Original instructions: Take 1 tablet (10 mg total) by mouth daily.         Objective:   Ht '5\' 4"'$  (1.626 m)   BMI 25.40 kg/m   Wt Readings from Last 3 Encounters:  01/15/23 148 lb (67.1 kg)  01/12/23 148 lb (67.1 kg)  12/27/22 145 lb (65.8 kg)    Physical Exam Vitals and nursing note reviewed.  Constitutional:      Appearance: Normal appearance. He is normal weight.  Skin:    Findings: No bruising.  Neurological:     General: No focal deficit present.     Mental Status: He is alert and oriented to person, place, and time.     Description    continue to take 1/2 tablet or 5 mg every Tuesday and then continue 10 mg on the rest of the week.   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  Counseling provided for all of the vaccine components Orders Placed This Encounter  Procedures   CoaguChek XS/INR Savanna Demira Gwynne, MD [facility] 01/17/2023, 4:29 PM

## 2023-01-18 LAB — COAGUCHEK XS/INR WAIVED
INR: 2 — ABNORMAL HIGH (ref 0.9–1.1)
Prothrombin Time: 24.1 s

## 2023-01-23 DIAGNOSIS — C7951 Secondary malignant neoplasm of bone: Secondary | ICD-10-CM | POA: Diagnosis not present

## 2023-01-23 DIAGNOSIS — C61 Malignant neoplasm of prostate: Secondary | ICD-10-CM | POA: Diagnosis not present

## 2023-01-24 ENCOUNTER — Other Ambulatory Visit (HOSPITAL_COMMUNITY): Payer: Self-pay

## 2023-01-24 ENCOUNTER — Ambulatory Visit (HOSPITAL_COMMUNITY)
Admission: RE | Admit: 2023-01-24 | Discharge: 2023-01-24 | Disposition: A | Discharge status: Home / Self Care | Payer: No Typology Code available for payment source | Source: Ambulatory Visit | Attending: Hematology | Admitting: Hematology

## 2023-01-24 ENCOUNTER — Inpatient Hospital Stay: Payer: No Typology Code available for payment source

## 2023-01-24 VITALS — BP 108/54 | HR 73 | Temp 98.7°F | Resp 16

## 2023-01-24 DIAGNOSIS — D649 Anemia, unspecified: Secondary | ICD-10-CM

## 2023-01-24 DIAGNOSIS — R2231 Localized swelling, mass and lump, right upper limb: Secondary | ICD-10-CM | POA: Diagnosis not present

## 2023-01-24 DIAGNOSIS — C61 Malignant neoplasm of prostate: Secondary | ICD-10-CM | POA: Diagnosis not present

## 2023-01-24 DIAGNOSIS — C7951 Secondary malignant neoplasm of bone: Secondary | ICD-10-CM

## 2023-01-24 DIAGNOSIS — I808 Phlebitis and thrombophlebitis of other sites: Secondary | ICD-10-CM | POA: Diagnosis not present

## 2023-01-24 LAB — CBC WITH DIFFERENTIAL/PLATELET
Abs Immature Granulocytes: 0.05 10*3/uL (ref 0.00–0.07)
Basophils Absolute: 0 10*3/uL (ref 0.0–0.1)
Basophils Relative: 0 %
Eosinophils Absolute: 0 10*3/uL (ref 0.0–0.5)
Eosinophils Relative: 1 %
HCT: 27.7 % — ABNORMAL LOW (ref 39.0–52.0)
Hemoglobin: 8.6 g/dL — ABNORMAL LOW (ref 13.0–17.0)
Immature Granulocytes: 1 %
Lymphocytes Relative: 11 %
Lymphs Abs: 0.7 10*3/uL (ref 0.7–4.0)
MCH: 31.5 pg (ref 26.0–34.0)
MCHC: 31 g/dL (ref 30.0–36.0)
MCV: 101.5 fL — ABNORMAL HIGH (ref 80.0–100.0)
Monocytes Absolute: 0.4 10*3/uL (ref 0.1–1.0)
Monocytes Relative: 6 %
Neutro Abs: 4.7 10*3/uL (ref 1.7–7.7)
Neutrophils Relative %: 81 %
Platelets: 216 10*3/uL (ref 150–400)
RBC: 2.73 MIL/uL — ABNORMAL LOW (ref 4.22–5.81)
RDW: 18.6 % — ABNORMAL HIGH (ref 11.5–15.5)
WBC: 5.8 10*3/uL (ref 4.0–10.5)
nRBC: 0 % (ref 0.0–0.2)

## 2023-01-24 LAB — COMPREHENSIVE METABOLIC PANEL
ALT: 13 U/L (ref 0–44)
AST: 38 U/L (ref 15–41)
Albumin: 3.2 g/dL — ABNORMAL LOW (ref 3.5–5.0)
Alkaline Phosphatase: 120 U/L (ref 38–126)
Anion gap: 14 (ref 5–15)
BUN: 18 mg/dL (ref 8–23)
CO2: 22 mmol/L (ref 22–32)
Calcium: 8.8 mg/dL — ABNORMAL LOW (ref 8.9–10.3)
Chloride: 97 mmol/L — ABNORMAL LOW (ref 98–111)
Creatinine, Ser: 1.08 mg/dL (ref 0.61–1.24)
GFR, Estimated: >60 mL/min (ref >60)
Glucose, Bld: 270 mg/dL — ABNORMAL HIGH (ref 70–99)
Potassium: 3.3 mmol/L — ABNORMAL LOW (ref 3.5–5.1)
Sodium: 133 mmol/L — ABNORMAL LOW (ref 135–145)
Total Bilirubin: 0.7 mg/dL (ref 0.3–1.2)
Total Protein: 6.2 g/dL — ABNORMAL LOW (ref 6.5–8.1)

## 2023-01-24 LAB — TYPE AND SCREEN
ABO/RH(D): B POS
Antibody Screen: NEGATIVE

## 2023-01-24 MED ORDER — DENOSUMAB 120 MG/1.7ML ~~LOC~~ SOLN
120.0000 mg | Freq: Once | SUBCUTANEOUS | Status: AC
  Administered 2023-01-24: 120 mg via SUBCUTANEOUS
  Filled 2023-01-24: qty 1.7

## 2023-01-24 NOTE — Progress Notes (Signed)
Patient presents today for Xgeva injection. Creatinine 1.08 and calcium 8.8. Patient taking Calcium Carb-Cholecalciferol (Calcium 600+D PO. Patient denies any jaw pain or upcoming dental surgery.   Alan [name]. presents today for injection per the provider's orders.  Xgeva administration without incident; injection site WNL; see MAR for injection details.  Patient tolerated procedure well and without incident. Vital signs stable. No complaints at this time. Discharged from clinic by wheel chair in stable condition. Alert and oriented x 3. F/U with [facility] as scheduled.

## 2023-01-26 ENCOUNTER — Ambulatory Visit (INDEPENDENT_AMBULATORY_CARE_PROVIDER_SITE_OTHER): Payer: No Typology Code available for payment source | Admitting: Urology

## 2023-01-26 ENCOUNTER — Telehealth: Payer: Self-pay | Admitting: *Deleted

## 2023-01-26 DIAGNOSIS — C61 Malignant neoplasm of prostate: Secondary | ICD-10-CM

## 2023-01-26 MED ORDER — DEGARELIX ACETATE 80 MG ~~LOC~~ SOLR
80.0000 mg | Freq: Once | SUBCUTANEOUS | Status: AC
  Administered 2023-01-26: 80 mg via SUBCUTANEOUS

## 2023-01-26 NOTE — Progress Notes (Signed)
Per Dr. Delton Coombes advised to do warm compresses every 3-4 hours during daytime and ibuprofen 400 mg 3 times daily for 2 days.

## 2023-01-26 NOTE — Telephone Encounter (Signed)
Spoke with wife Mechele Claude regarding [place] results.  Per Dr. Delton Coombes, advised to do compresses every 3-4 hours during daytime and ibuprofen 400 mg 3 times daily for 2 days.  Verbalized understanding.

## 2023-01-26 NOTE — Progress Notes (Signed)
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   Medication: Mills Koller (Degarelix)  Dose: 24m Location: right upper abdomen Lot: [id number]: 04/10/2025  Patient tolerated well, no complications were noted  Performed by: SMarisue Brooklyn CMA  [facility]

## 2023-01-27 ENCOUNTER — Other Ambulatory Visit: Payer: Self-pay | Admitting: Family Medicine

## 2023-01-31 ENCOUNTER — Other Ambulatory Visit (HOSPITAL_COMMUNITY): Payer: Self-pay

## 2023-02-07 ENCOUNTER — Other Ambulatory Visit: Payer: Self-pay

## 2023-02-08 ENCOUNTER — Encounter: Payer: Self-pay | Admitting: Physician Assistant

## 2023-02-08 ENCOUNTER — Inpatient Hospital Stay: Payer: No Typology Code available for payment source

## 2023-02-08 ENCOUNTER — Inpatient Hospital Stay: Payer: No Typology Code available for payment source | Admitting: Physician Assistant

## 2023-02-08 ENCOUNTER — Other Ambulatory Visit: Payer: No Typology Code available for payment source

## 2023-02-08 ENCOUNTER — Ambulatory Visit: Payer: No Typology Code available for payment source | Admitting: Physician Assistant

## 2023-02-08 VITALS — BP 146/76 | HR 71 | Temp 98.1°F | Resp 18 | Wt 145.2 lb

## 2023-02-08 DIAGNOSIS — D649 Anemia, unspecified: Secondary | ICD-10-CM

## 2023-02-08 DIAGNOSIS — K5909 Other constipation: Secondary | ICD-10-CM

## 2023-02-08 DIAGNOSIS — R630 Anorexia: Secondary | ICD-10-CM | POA: Diagnosis not present

## 2023-02-08 DIAGNOSIS — C61 Malignant neoplasm of prostate: Secondary | ICD-10-CM | POA: Diagnosis not present

## 2023-02-08 LAB — FERRITIN: Ferritin: 148 ng/mL (ref 24–336)

## 2023-02-08 LAB — COMPREHENSIVE METABOLIC PANEL
ALT: 16 U/L (ref 0–44)
AST: 46 U/L — ABNORMAL HIGH (ref 15–41)
Albumin: 3.1 g/dL — ABNORMAL LOW (ref 3.5–5.0)
Alkaline Phosphatase: 176 U/L — ABNORMAL HIGH (ref 38–126)
Anion gap: 11 (ref 5–15)
BUN: 27 mg/dL — ABNORMAL HIGH (ref 8–23)
CO2: 20 mmol/L — ABNORMAL LOW (ref 22–32)
Calcium: 8.2 mg/dL — ABNORMAL LOW (ref 8.9–10.3)
Chloride: 101 mmol/L (ref 98–111)
Creatinine, Ser: 1.17 mg/dL (ref 0.61–1.24)
GFR, Estimated: >60 mL/min (ref >60)
Glucose, Bld: 315 mg/dL — ABNORMAL HIGH (ref 70–99)
Potassium: 3.9 mmol/L (ref 3.5–5.1)
Sodium: 132 mmol/L — ABNORMAL LOW (ref 135–145)
Total Bilirubin: 0.5 mg/dL (ref 0.3–1.2)
Total Protein: 6.4 g/dL — ABNORMAL LOW (ref 6.5–8.1)

## 2023-02-08 LAB — CBC WITH DIFFERENTIAL/PLATELET
Abs Immature Granulocytes: 0.04 10*3/uL (ref 0.00–0.07)
Basophils Absolute: 0 10*3/uL (ref 0.0–0.1)
Basophils Relative: 0 %
Eosinophils Absolute: 0.1 10*3/uL (ref 0.0–0.5)
Eosinophils Relative: 1 %
HCT: 25.4 % — ABNORMAL LOW (ref 39.0–52.0)
Hemoglobin: 8.3 g/dL — ABNORMAL LOW (ref 13.0–17.0)
Immature Granulocytes: 1 %
Lymphocytes Relative: 16 %
Lymphs Abs: 1 10*3/uL (ref 0.7–4.0)
MCH: 32.8 pg (ref 26.0–34.0)
MCHC: 32.7 g/dL (ref 30.0–36.0)
MCV: 100.4 fL — ABNORMAL HIGH (ref 80.0–100.0)
Monocytes Absolute: 0.4 10*3/uL (ref 0.1–1.0)
Monocytes Relative: 7 %
Neutro Abs: 4.4 10*3/uL (ref 1.7–7.7)
Neutrophils Relative %: 75 %
Platelets: 207 10*3/uL (ref 150–400)
RBC: 2.53 MIL/uL — ABNORMAL LOW (ref 4.22–5.81)
RDW: 16.8 % — ABNORMAL HIGH (ref 11.5–15.5)
WBC: 5.9 10*3/uL (ref 4.0–10.5)
nRBC: 0.3 % — ABNORMAL HIGH (ref 0.0–0.2)

## 2023-02-08 LAB — IRON AND TIBC
Iron: 35 ug/dL — ABNORMAL LOW (ref 45–182)
Saturation Ratios: 11 % — ABNORMAL LOW (ref 17.9–39.5)
TIBC: 318 ug/dL (ref 250–450)
UIBC: 283 ug/dL

## 2023-02-08 LAB — MAGNESIUM: Magnesium: 2.1 mg/dL (ref 1.7–2.4)

## 2023-02-08 LAB — VITAMIN B12: Vitamin B-12: 267 pg/mL (ref 180–914)

## 2023-02-09 ENCOUNTER — Telehealth: Payer: Self-pay | Admitting: *Deleted

## 2023-02-09 NOTE — Telephone Encounter (Signed)
Per Murray Hodgkins, patient's wife instructed to taper off of Prednisone by taking 5 mg every other day x 1 week then stop.  Verbalized understanding.

## 2023-02-10 ENCOUNTER — Encounter (HOSPITAL_COMMUNITY): Payer: Self-pay | Admitting: Hematology

## 2023-02-10 ENCOUNTER — Other Ambulatory Visit: Payer: Self-pay | Admitting: Physician Assistant

## 2023-02-10 NOTE — Progress Notes (Signed)
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  PRIOR THERAPY: Erleada from 02/08/2022 through 10/03/2022  NGS Results: not done  CURRENT THERAPY: Firmagon and Zytiga  BRIEF ONCOLOGIC HISTORY:  Oncology History  Prostate cancer ([place])  04/13/2014 Initial Diagnosis   Prostate cancer ([place])   06/25/2020 Cancer Staging   Staging form: Prostate, AJCC 8th Edition - Clinical stage from 06/25/2020: Stage IIC (cT2b, cN0, cM0, PSA: 12.8, Grade Group: 3) - Signed by Freeman Caldron, PA-C on 09/29/2020     CANCER STAGING:  Cancer Staging  Prostate cancer [facility]) Staging form: Prostate, AJCC 8th Edition - Clinical stage from 06/25/2020: Stage IIC (cT2b, cN0, cM0, PSA: 12.8, Grade Group: 3) - Signed by Freeman Caldron, PA-C on 09/29/2020 - Pathologic stage from 01/26/2022: Stage IVB (pT2, pN0, pM1b, Grade Group: 3) - Unsigned   INTERVAL HISTORY:  [name], a 82 y.o. 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 He was last seen by Dr. Delton Coombes on 01/15/2023. He presents today for a acute follow up due to not feeling well   [name] reports that his fatigue is persistent and has not improved since discontinuing Zytiga. He stills takes Prednisone 5 mg daily. His appetite is poor and he lost 3 lbs since 01/15/2023. He doe supplement his diet with protein shakes 1-2 times a day. His wife adds that he is a picky eater. He denies nausea or vomiting. He was constipated for 3 days which improved with drinking prune juice. With the constipation came abdominal pain that he rated as 7-8 out of 10 on a pain scale. He reports the abdominal pain improved with his prescribed pain medication. Since having a bowel movement this morning, he denies any recurrent abdominal pain. He denies easy  bruising or sings of active bleeding. This includes hematochezia or melena. He denies fevers, chills, sweats, shortness of breath, chest pain or cough. He has no other complaints. Rest of the ROS is below.    REVIEW OF SYSTEMS:  Review of Systems  Constitutional:  Positive for fatigue. Negative for chills and fever.  HENT:   Negative for nosebleeds, sore throat and trouble swallowing.   Respiratory:  Negative for cough and shortness of breath.   Cardiovascular:  Negative for chest pain, leg swelling and palpitations.  Gastrointestinal:  Positive for abdominal pain and constipation. Negative for blood in stool, diarrhea, nausea and vomiting.  Genitourinary:  Negative for bladder incontinence, difficulty urinating, dysuria, frequency, hematuria and nocturia.   Musculoskeletal:  Negative for arthralgias, back pain, flank pain, myalgias and neck pain.  Skin:  Negative for itching and rash.  Neurological:  Negative for dizziness, headaches and numbness.  Hematological:  Does not bruise/bleed easily.  Psychiatric/Behavioral:  Negative for depression, sleep disturbance and suicidal ideas. The patient is not nervous/anxious.   All other systems reviewed and are negative.   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   found during hospital stay with pancreatitis,  started coumadin;   last Abd CT in epic 07-29-2020  chronic thrombus portal venous   GERD (gastroesophageal reflux disease)    Heart murmur    History of acute pancreatitis    2016   necrotizing pancreatitis   History of adenomatous polyp of colon    History of DVT of lower extremity    History of GI bleed 08/2019   upper gi bleed due to duodenal ulcer    History of gout yrs ago   Hypertension    followed by pcp   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 Tamala Julian--  event monitor 07-04-2017 epic, NSR/ PAflutter with occasional RVR/  PACs/ PVCs ;  echo 05-16-2017 epic,  moderate LVH with severe asymptomic septal hypertrophy, G1DD, EF 60-65%,  mild AR, mild LAE   Pancreatic pseudocyst    Peyronie's disease    Prostate cancer [facility]) urologist-- dr wrenn/ oncologist-- dr Tammi Klippel   first dx 08/ 2009 Gleason 3+3 active survelliance;  until bx 07/ 2021  Stage T2b, Gleason 4+3   Right inguinal hernia    Type 2 diabetes mellitus ([place])    followed by pcp---  (11-03-2020 per pt currently not checking blood sugar due to glucose monitor broken)   Wears glasses    Wears partial dentures    upper and lower   Past Surgical History:  Procedure Laterality Date   BIOPSY  02/09/2022   Procedure: BIOPSY;  Surgeon: Milus Banister, MD;  Location: [facility];  Service: Endoscopy;;   CHOLECYSTECTOMY N/A 11/26/2015   Procedure: LAPAROSCOPIC CHOLECYSTECTOMY;  Surgeon: Aviva Signs, MD;  Location: [facility];  Service: General;  Laterality: N/A;   COLONOSCOPY  last one 09-06-2020  dr stark   CYSTOSCOPY N/A 11/09/2020   Procedure: Erlene Quan;  Surgeon: Irine Seal, MD;  Location: [facility];  Service: Urology;  Laterality: N/A;   ESOPHAGOGASTRODUODENOSCOPY  10-06-2019  '@[facility]'$    ESOPHAGOGASTRODUODENOSCOPY N/A 02/09/2022   Procedure: ESOPHAGOGASTRODUODENOSCOPY (EGD);  Surgeon: Milus Banister, MD;  Location: [facility];  Service: Endoscopy;  Laterality: N/A;   EUS N/A 02/09/2022   Procedure: UPPER ENDOSCOPIC ULTRASOUND (EUS) RADIAL;  Surgeon: Milus Banister, MD;  Location: [facility];  Service: Endoscopy;  Laterality: N/A;   FLEXIBLE BRONCHOSCOPY Bilateral 04/14/2016   Procedure:  FLEXIBLE BRONCHOSCOPY;  Surgeon: Sinda Du, MD;  [facility];  Service: Cardiopulmonary;  Laterality: Bilateral;   HEMORRHOID SURGERY  yrs ago   RADIOACTIVE SEED IMPLANT N/A 11/09/2020   Procedure: RADIOACTIVE SEED IMPLANT/BRACHYTHERAPY IMPLANT;  Surgeon: Irine Seal, MD;  Location: [facility];  Service: Urology;  Laterality: N/A;   SPACE OAR INSTILLATION N/A 11/09/2020   Procedure: SPACE OAR INSTILLATION;  Surgeon: Irine Seal, MD;  Location: [facility];  Service: Urology;  Laterality: N/A;   UMBILICAL HERNIA REPAIR N/A 11/26/2015   Procedure: UMBILICAL HERNIORRHAPHY;  Surgeon: Aviva Signs, MD;  Location: [facility];  Service: General;  Laterality: N/A;    SOCIAL HISTORY:  Social History   Socioeconomic History   Marital status: Married    Spouse name: [name]   Number of children: 4   Years of education: Not on file   Highest education level: 10th grade  Occupational History   Occupation: Retired     Fish farm manager: [employer]  Tobacco Use   Smoking status: Former  Years: 5.00    Types: Cigarettes    Quit date: 12/11/1986    Years since quitting: 36.1   Smokeless tobacco: Never  Vaping Use   Vaping Use: Never used  Substance and Sexual Activity   Alcohol use: No   Drug use: Never   Sexual activity: Yes  Other Topics Concern   Not on file  Social History Narrative   Not on file   Social Determinants of Health   Financial Resource Strain: Low Risk  (08/12/2021)   Overall Financial Resource Strain (CARDIA)    Difficulty of Paying Living Expenses: Not hard at all  Food Insecurity: No Food Insecurity (08/12/2021)   Hunger Vital Sign    Worried About Running Out of Food in the Last Year: Never true    [place] in the Last Year: Never true  Transportation Needs: No Transportation Needs (08/12/2021)   PRAPARE - Hydrologist (Medical): No    Lack of Transportation (Non-Medical): No  Physical Activity:  Inactive (10/25/2021)   Exercise Vital Sign    Days of Exercise per Week: 0 days    Minutes of Exercise per Session: 0 min  Stress: Stress Concern Present (10/25/2021)   [place]    Feeling of Stress : To some extent  Social Connections: Socially Integrated (08/12/2021)   Social Connection and Isolation Panel [NHANES]    Frequency of Communication with Friends and Family: Three times a week    Frequency of Social Gatherings with Friends and Family: Three times a week    Attends Religious Services: More than 4 times per year    Active Member of Clubs or Organizations: Yes    Attends Archivist Meetings: More than 4 times per year    Marital Status: Married  Human resources officer Violence: Not At Risk (08/12/2021)   Humiliation, Afraid, Rape, and Kick questionnaire    Fear of Current or Ex-Partner: No    Emotionally Abused: No    Physically Abused: No    Sexually Abused: No    FAMILY HISTORY:  Family History  Problem Relation Age of Onset   Stroke Mother    Hypertension Mother    Alzheimer's disease Mother    Hypertension Father    Brain cancer Sister    Breast cancer Sister    Hypertension Sister    Deep vein thrombosis Brother    Hypertension Brother    Colon cancer Neg Hx    Pancreatic disease Neg Hx    Esophageal cancer Neg Hx    Rectal cancer Neg Hx    Stomach cancer Neg Hx     CURRENT MEDICATIONS:  Current Outpatient Medications  Medication Sig Dispense Refill   abiraterone acetate (ZYTIGA) 250 MG tablet Take 4 tablets (1,000 mg total) by mouth daily. Take on an empty stomach 1 hour before or 2 hours after a meal 120 tablet 3   acetaminophen (TYLENOL) 500 MG tablet Take 1,000 mg by mouth every 6 (six) hours as needed for moderate pain.     amLODipine (NORVASC) 10 MG tablet Take 1 tablet (10 mg total) by mouth every evening. 90 tablet 3   Blood Glucose Monitoring Suppl (ONE TOUCH ULTRA MINI)  w/Device KIT 1 each by Does not apply route 2 (two) times daily. 1 kit 1   Blood Pressure KIT 1 each by Does not apply route daily. 1 kit 0   Calcium Carb-Cholecalciferol (CALCIUM 600 +  D PO) Take 1 tablet by mouth in the morning and at bedtime.     calcium carbonate (OSCAL) 1500 (600 Ca) MG TABS tablet Take by mouth 2 (two) times daily with a meal.     chlorthalidone (HYGROTON) 25 MG tablet Take 1 tablet by mouth once daily 90 tablet 3   diclofenac Sodium (VOLTAREN) 1 % GEL Apply 1 application topically 2 (two) times daily as needed (pain).     fluticasone (FLONASE) 50 MCG/ACT nasal spray Place 1 spray into both nostrils 2 (two) times daily as needed for allergies or rhinitis. 16 g 6   glucose blood (ONE TOUCH ULTRA TEST) test strip Use to check BG once daily.  Dx:  Type 2 DM controlled E11.9 100 each 4   HYDROcodone-acetaminophen (NORCO/VICODIN) 5-325 MG tablet Take 1 tablet by mouth every 6 (six) hours as needed for moderate pain. Can take every 4 hours if needed. 30 tablet 0   lisinopril (ZESTRIL) 40 MG tablet Take 1 tablet (40 mg total) by mouth daily. 90 tablet 3   magnesium oxide (MAG-OX) 400 (240 Mg) MG tablet Take 1 tablet (400 mg total) by mouth in the morning, at noon, and at bedtime. 90 tablet 6   metFORMIN (GLUCOPHAGE) 1000 MG tablet Take 1 tablet (1,000 mg total) by mouth 2 (two) times daily with a meal. 180 tablet 3   metoprolol succinate (TOPROL-XL) 100 MG 24 hr tablet Take 1 tablet (100 mg total) by mouth daily. Take with or immediately following a meal. 90 tablet 3   omeprazole (PRILOSEC) 20 MG capsule Take 1 capsule (20 mg total) by mouth daily. 90 capsule 3   potassium chloride SA (KLOR-CON M) 20 MEQ tablet TAKE 1  BY MOUTH ONCE DAILY Strength: 20 mEq 90 tablet 3   pravastatin (PRAVACHOL) 40 MG tablet Take 1 tablet (40 mg total) by mouth every evening. 90 tablet 3   predniSONE (DELTASONE) 5 MG tablet Take 1 tablet (5 mg total) by mouth daily with breakfast. 90 tablet 2   SV IRON  325 (65 Fe) MG tablet Take 1 tablet by mouth once daily with breakfast 90 tablet 0   warfarin (COUMADIN) 10 MG tablet Take 1 tablet (10 mg total) by mouth daily. 90 tablet 3   No current facility-administered medications for this visit.    ALLERGIES:  Allergies  Allergen Reactions   Allopurinol Other (See Comments)    Significantly decreased WBC's   Colchicine Other (See Comments)    Significantly decreased WBC's    PHYSICAL EXAM:  Performance status (ECOG): 1 - Symptomatic but completely ambulatory  Vitals:   02/08/23 1406  BP: (!) 146/76  Pulse: 71  Resp: 18  Temp: 98.1 F (36.7 C)  SpO2: 100%    Wt Readings from Last 3 Encounters:  02/08/23 145 lb 3.2 oz (65.9 kg)  01/15/23 148 lb (67.1 kg)  01/12/23 148 lb (67.1 kg)   Constitutional: Oriented to person, place, and time and well-developed, well-nourished, and in no distress.  HENT:  Head: Normocephalic and atraumatic.  Eyes: Conjunctivae are normal. Right eye exhibits no discharge. Left eye exhibits no discharge. No scleral icterus.  Neck: Normal range of motion. Neck supple.   Cardiovascular: Normal rate, regular rhythm, normal heart sounds Pulmonary/Chest: Effort normal and breath sounds normal. No respiratory distress. No wheezes. No rales.  Abdominal: Soft. Bowel sounds are normal. Exhibits no distension and no mass. There is no tenderness.  Musculoskeletal: Normal range of motion. Exhibits no edema.  Lymphadenopathy: No  cervical adenopathy.  Neurological: Alert and oriented to person, place, and time. Exhibits normal muscle tone. Gait normal. Coordination normal.  Skin: Skin is warm and dry. No rash noted. Not diaphoretic. No erythema. No pallor.  Psychiatric: Mood, memory and judgment normal.    LABORATORY DATA:  I have reviewed the labs as listed.     Latest Ref Rng & Units 02/08/2023   12:57 PM 01/24/2023   12:59 PM 12/26/2022    1:26 PM  CBC  WBC 4.0 - 10.5 K/uL 5.9  5.8  5.1   Hemoglobin 13.0 - 17.0  g/dL 8.3  8.6  6.2   Hematocrit 39.0 - 52.0 % 25.4  27.7  20.5   Platelets 150 - 400 K/uL 207  216  213       Latest Ref Rng & Units 02/08/2023   12:57 PM 01/24/2023   12:59 PM 12/26/2022    1:26 PM  CMP  Glucose 70 - 99 mg/dL 315  270  236   BUN 8 - 23 mg/dL 27  18  41   Creatinine 0.61 - 1.24 mg/dL 1.17  1.08  1.28   Sodium 135 - 145 mmol/L 132  133  133   Potassium 3.5 - 5.1 mmol/L 3.9  3.3  2.9   Chloride 98 - 111 mmol/L 101  97  98   CO2 22 - 32 mmol/L '20  22  20   '$ Calcium 8.9 - 10.3 mg/dL 8.2  8.8  9.2   Total Protein 6.5 - 8.1 g/dL 6.4  6.2  6.2   Total Bilirubin 0.3 - 1.2 mg/dL 0.5  0.7  0.5   Alkaline Phos 38 - 126 U/L 176  120  100   AST 15 - 41 U/L 46  38  36   ALT 0 - 44 U/L '16  13  16    '$ Lab Results  Component Value Date   PSA1 382.0 (H) 01/12/2022   PSA1 66.7 (H) 11/22/2021   PSA1 53.2 (H) 11/17/2021   PSA 6.4 (H) 05/20/2020   PSA 2.0 11/12/2014      DIAGNOSTIC IMAGING:  I have independently reviewed the scans and discussed with the patient. US Venous Img Upper Uni Right(DVT)  Result Date: 01/24/2023 CLINICAL DATA:  82 year old male with forearm nodule after prior IV EXAM: RIGHT UPPER EXTREMITY VENOUS DOPPLER ULTRASOUND TECHNIQUE: Gray-scale sonography with graded compression, as well as color Doppler and duplex ultrasound were performed to evaluate the upper extremity deep venous system from the level of the subclavian vein and including the jugular, axillary, basilic, radial, ulnar and upper cephalic vein. Spectral Doppler was utilized to evaluate flow at rest and with distal augmentation maneuvers. COMPARISON:  None Available. FINDINGS: Contralateral Subclavian Vein: Respiratory phasicity is normal and symmetric with the symptomatic side. No evidence of thrombus. Normal compressibility. Internal Jugular Vein: No evidence of thrombus. Normal compressibility, respiratory phasicity and response to augmentation. Subclavian Vein: No evidence of thrombus. Normal  compressibility, respiratory phasicity and response to augmentation. Axillary Vein: No evidence of thrombus. Normal compressibility, respiratory phasicity and response to augmentation. Cephalic Vein: Short segment of the cephalic vein at the wrist demonstrates non compressible segment with no flow maintained. The more central cephalic vein is patent without thrombus. Basilic Vein: No evidence of thrombus. Normal compressibility, respiratory phasicity and response to augmentation. Brachial Veins: Parallel brachial veins identified. No evidence of thrombus. Normal compressibility, respiratory phasicity and response to augmentation. Radial Veins: No evidence of thrombus. Normal compressibility, respiratory phasicity and response  to augmentation. Ulnar Veins: No evidence of thrombus. Normal compressibility, respiratory phasicity and response to augmentation. Other Findings:  None visualized. IMPRESSION: Directed duplex of the right upper extremity negative for DVT. Positive for short segment superficial venous thrombophlebitis involving the distal right cephalic vein. Signed, Dulcy Fanny. Nadene Rubins, RPVI [facility] Electronically Signed   By: Corrie Mckusick D.O.   On: 01/24/2023 16:00   CT Abdomen Pelvis W Contrast  Result Date: 01/11/2023 CLINICAL DATA:  Metastatic prostate cancer on medical therapy. Melena and decreased energy for 2 weeks. Restaging. * Tracking Code: BO * EXAM: CT ABDOMEN AND PELVIS WITH CONTRAST TECHNIQUE: Multidetector CT imaging of the abdomen and pelvis was performed using the standard protocol following bolus administration of intravenous contrast. RADIATION DOSE REDUCTION: This exam was performed according to the departmental dose-optimization program which includes automated exposure control, adjustment of the mA and/or kV according to patient size and/or use of iterative reconstruction technique. CONTRAST:  19m OMNIPAQUE  IOHEXOL 300 MG/ML  SOLN COMPARISON:  09/28/2022 CT abdomen/pelvis. FINDINGS: Lower chest: No significant pulmonary nodules or acute consolidative airspace disease. Hepatobiliary: Normal liver size. No liver mass. Cholecystectomy. No biliary ductal dilatation. 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 Thick-walled hypodense 2.8 x 2.3 cm lesion abutting the proximal lesser curvature of the stomach and contiguous with the superior pancreatic body (series 2/image 20), previously 2.9 x 2.2 cm using similar measurement technique, not appreciably changed. Spleen: Normal size. No mass. Adrenals/Urinary Tract: Stable adrenals without discrete adrenal nodules. No hydronephrosis. Subcentimeter hypodense left renal cortical lesions are too small to characterize and are unchanged, for which no follow-up imaging is recommended. Normal bladder. Stomach/Bowel: Small hiatal hernia. Chronic perigastric varices. Chronic very large right inguinal hernia containing much of the small bowel. No small bowel wall thickening, pneumatosis or dilation. Normal appendix. Moderate left colonic diverticulosis with no large bowel wall thickening or significant pericolonic fat stranding. Vascular/Lymphatic: Atherosclerotic nonaneurysmal abdominal aorta. Patent hepatic and renal veins. 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 Chronic prominent left retroperitoneal venous varices. No pathologically enlarged lymph nodes in the abdomen or pelvis. Reproductive: Stable borderline prominent prostate with brachytherapy seeds. Other: No pneumoperitoneum. Trace ascites in the right inguinal hernia, unchanged. No new focal fluid collections. Musculoskeletal: Patchy confluent  sclerotic osseous metastases throughout the visualized thoracolumbar spine, bilateral ribs and bilateral pelvic girdle, not appreciably changed. No appreciable new focal osseous lesions. Marked thoracolumbar spondylosis. IMPRESSION: 1. Stable CT. Widespread sclerotic osseous metastases are not appreciably changed. No evidence of extraosseous metastatic disease in the abdomen or pelvis. 2. Chronic atrophy of and duct dilation within the pancreatic body and tail, stable back to 2021 CT, compatible with chronic pancreatitis as previously reported. 3. Thick-walled hypodense 2.8 x 2.3 cm lesion abutting the proximal lesser curvature of the stomach and contiguous with the superior pancreatic body, not appreciably changed, favoring a chronic pancreatic pseudocyst as previously reported. 4. 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 Chronic prominent left retroperitoneal venous varices. 5. Chronic very large right inguinal hernia containing much of the small bowel. Stable trace ascites in the right inguinal hernia. No evidence of bowel obstruction or acute bowel inflammation. 6.  Small hiatal hernia. 7. Moderate left colonic diverticulosis. 8.  Aortic Atherosclerosis (ICD10-I70.0). Electronically Signed   By: Ilona Sorrel M.D.   On: 01/11/2023 18:41   NM Bone Scan Whole Body  Result Date: 01/11/2023 CLINICAL DATA:  Metastatic prostate cancer assess treatment response EXAM: NUCLEAR MEDICINE WHOLE BODY BONE SCAN TECHNIQUE: Whole body anterior and posterior images were obtained approximately 3 hours after intravenous injection of radiopharmaceutical. RADIOPHARMACEUTICALS:  20 mCi Technetium-60mMDP IV COMPARISON:  09/20/2022 Correlation: CT abdomen and pelvis 01/11/2023 FINDINGS: Multiple sites of abnormal osseous tracer uptake consistent with osseous metastatic disease, progressive since previous exam. Sites  include anterior and posterior ribs bilaterally, sternum, thoracic spine, and pelvis. Additional foci of uptake are seen at the RIGHT humeral head, proximal LEFT humerus, and proximal femora bilaterally. Very large RIGHT inguinal hernia. Otherwise expected urinary tract and soft tissue distribution of tracer. IMPRESSION: Progressive osseous metastatic disease since 09/20/2022. Electronically Signed   By: MLavonia DanaM.D.   On: 01/11/2023 17:33     ASSESSMENT:  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  He has been on finasteride. - Biopsy on 12/24/2013: Right lateral mid-prostatic adenocarcinoma, Gleason 3+3=6 - Biopsy on 06/25/2020: Prostatic adenocarcinoma, T2b, Gleason 4+3=7, PSA 12.8 (adjusted for finasteride) - Bone scan on 07/06/2020 and CT on 07/29/2020 negative. - 11/09/2020: Insertion of radioactive I-125 seeds - PSA 5.4 (6/22), 53.2 (11/17/2021), 66.7 (11/22/2021), 382 (01/12/2022) - CTAP on 11/25/2021 with no evidence of metastatic disease.  New ill-defined soft tissue adjacent to the pancreatic body encasing the proximal splenic artery, sequela of interval pancreatitis although neoplasm is considered.  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 - Bone scan on 12/08/2021 with possible uptake in the lower thoracic vertebral body.  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 - CT chest without contrast on 01/03/2022 with no CT correlate in the lower thoracic spine.  No additional metastatic disease. - Degarelix  240 mg by Dr. WJeffie Pollockon 01/12/2022. - PSMA PET scan on 01/30/2022: Multiple skeletal metastasis in the axillary and appendicular skeleton with nearly every vertebral body involved with carcinoma.  Broad lesions in the sacrum and pelvis.  Focal activity in the anterior apical region of the prostate gland.  No metastatic adenopathy or visceral metastasis. -Alford Highland3 tablets daily started  around 02/08/2022, dose increased to 4 tablets daily on 03/21/2022. -He was hospitalized from 03/12/2022 through 03/14/2022 with pneumonia.  I have reviewed hospitalization records.  He reportedly fell on the night of 03/14/2022 when he lost balance while hanging his robe. - Erleada dose reduced to 3 tablets daily on 04/10/2022 due to diarrhea and rash.  Discontinued on 10/03/2022 due to progression.  Abiraterone and prednisone started on 10/05/2022, discontinued on 01/15/2023 due to progression.   Social/family history: - Lives with wife and granddaughter at home.  Worked in tCharity fundraiseras a mAir traffic controller  Quit smoking 50 years ago. - 2 sisters had brain cancer.  Maternal grandmother had cancer.  Niece had breast cancer.   PLAN:  Metastatic CSPC to the bones: -Patient discontinued Abiraterone after the last visit with Dr. KDelton Coombeshowever continues to take prednisone. Advised to taper off prednisone 5 mg every other day for 1 week and then discontinue. - He is not a candidate for chemotherapy and Germline mutation testing was negative in 2023. - Awaiting Guardant 360 and NGS testing to finalize before discussing next line of therapy  -- Follow up with Dr. KDelton Coombes  on 02/21/2023.  2.  Bone metastasis: - Continue denosumab monthly.  3.  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  4.  Normocytic anemia: - He has occasional blood in the stool. - He has received 1 unit PRBC.  He also received Feraheme x 2 on 01/01/2023 and 01/08/2023. - Due to persistent fatigue, repeat iron levels today show deficiency with serum iron 35, saturation 11%/ --We will arrange for IV feraheme x 2.   5.  Hypomagnesemia: - Continue magnesium 3 times daily.  Last magnesium is normal.  6. Poor appetite/Weight loss: -- Encourage patient to eat small, frequent meals and supplement with protein shakes. -- Will make referral to [facility]  7. Constipation: -- Encouraged  patient to be aggressive with stool softeners/laxatives.     Orders placed this encounter:  Orders Placed This Encounter  Procedures   Iron and TIBC ([facility]/AP/[facility] ONLY)   Ferritin   Vitamin B12    Patient expressed understanding with the plan provided.  I have spent a total of 30 minutes minutes of face-to-face and non-face-to-face time, preparing to see the patient, performing a medically appropriate examination, counseling and educating the patient, ordering medications/tests/procedures, documenting clinical information in the electronic health record, and care coordination.   Dede Query PA-C Dept of Hematology and [place]  Phone: [phone]

## 2023-02-12 ENCOUNTER — Other Ambulatory Visit: Payer: Self-pay

## 2023-02-12 ENCOUNTER — Telehealth: Payer: Self-pay | Admitting: *Deleted

## 2023-02-12 DIAGNOSIS — C7951 Secondary malignant neoplasm of bone: Secondary | ICD-10-CM

## 2023-02-12 DIAGNOSIS — G893 Neoplasm related pain (acute) (chronic): Secondary | ICD-10-CM

## 2023-02-12 MED ORDER — LACTULOSE 10 GM/15ML PO SOLN: 20.0000 g | Freq: Every day | ORAL | 0 refills | Status: DC

## 2023-02-12 MED ORDER — HYDROCODONE-ACETAMINOPHEN 5-325 MG PO TABS: 1.0000 | ORAL_TABLET | Freq: Four times a day (QID) | ORAL | 0 refills | Status: DC | PRN

## 2023-02-12 NOTE — Telephone Encounter (Signed)
Wife called stating that he has not had a BM since Thursday.  Has recently started on iron tablets and takes hydrocodone as well.  Advised to start colace 2 caps daily and will send in lactulose.  Instructed to take 30 ml every 3 hours until he has normal BM then 30 ml at bedtime thereafter.  Verbalized understanding.

## 2023-02-14 ENCOUNTER — Ambulatory Visit: Payer: No Typology Code available for payment source | Admitting: Family Medicine

## 2023-02-14 ENCOUNTER — Encounter: Payer: Self-pay | Admitting: Family Medicine

## 2023-02-14 VITALS — BP 148/56 | HR 80 | Ht 64.0 in | Wt 144.0 lb

## 2023-02-14 DIAGNOSIS — Z7901 Long term (current) use of anticoagulants: Secondary | ICD-10-CM

## 2023-02-14 DIAGNOSIS — Z86718 Personal history of other venous thrombosis and embolism: Secondary | ICD-10-CM | POA: Diagnosis not present

## 2023-02-14 DIAGNOSIS — D6859 Other primary thrombophilia: Secondary | ICD-10-CM | POA: Diagnosis not present

## 2023-02-14 LAB — COAGUCHEK XS/INR WAIVED
INR: 3.3 — ABNORMAL HIGH (ref 0.9–1.1)
Prothrombin Time: 39.2 s

## 2023-02-14 NOTE — Progress Notes (Signed)
BP (!) 148/56   Pulse 80   Ht '5\' 4"'$  (1.626 m)   Wt 144 lb (65.3 kg)   SpO2 98%   BMI 24.72 kg/m    Subjective:   Patient ID: Alan [name]., male    DOB: 08/26/1941, 82 y.o.   MRN: [id number]  HPI: Alan [name]. is a 82 y.o. male presenting on 02/14/2023 for Medical Management of Chronic Issues and Atrial Fibrillation   HPI A-fib recheck Coumadin recheck Target goal: 2.0-3.0 Reason on anticoagulation: Chronic A-fib Patient denies any bruising or bleeding or chest pain or palpitations   Relevant past medical, surgical, family and social history reviewed and updated as indicated. Interim medical history since our last visit reviewed. Allergies and medications reviewed and updated.  Review of Systems  Constitutional:  Negative for chills and fever.  Eyes:  Negative for visual disturbance.  Respiratory:  Negative for shortness of breath and wheezing.   Cardiovascular:  Negative for chest pain and leg swelling.  Gastrointestinal:  Negative for blood in stool.  Genitourinary:  Negative for hematuria.  Skin:  Negative for rash.  All other systems reviewed and are negative.   Per HPI unless specifically indicated above   Allergies as of 02/14/2023       Reactions   Allopurinol Other (See Comments)   Significantly decreased WBC's   Colchicine Other (See Comments)   Significantly decreased WBC's        Medication List        Accurate as of February 14, 2023  1:13 PM. If you have any questions, ask your nurse or doctor.          STOP taking these medications    predniSONE 5 MG tablet Commonly known as: DELTASONE Stopped by: Fransisca Kaufmann Shaniah Baltes, MD       TAKE these medications    abiraterone acetate 250 MG tablet Commonly known as: ZYTIGA Take 4 tablets (1,000 mg total) by mouth daily. Take on an empty stomach 1 hour before or 2 hours after a meal   acetaminophen 500 MG tablet Commonly known as: TYLENOL Take 1,000 mg by mouth every 6 (six) hours as needed  for moderate pain.   amLODipine 10 MG tablet Commonly known as: NORVASC Take 1 tablet (10 mg total) by mouth every evening.   Blood Pressure Kit 1 each by Does not apply route daily.   CALCIUM 600 + D PO Take 1 tablet by mouth in the morning and at bedtime.   calcium carbonate 1500 (600 Ca) MG Tabs tablet Commonly known as: OSCAL Take by mouth 2 (two) times daily with a meal.   chlorthalidone 25 MG tablet Commonly known as: HYGROTON Take 1 tablet by mouth once daily   diclofenac Sodium 1 % Gel Commonly known as: VOLTAREN Apply 1 application topically 2 (two) times daily as needed (pain).   docusate sodium 100 MG capsule Commonly known as: COLACE Take 100 mg by mouth 2 (two) times daily.   fluticasone 50 MCG/ACT nasal spray Commonly known as: FLONASE Place 1 spray into both nostrils 2 (two) times daily as needed for allergies or rhinitis.   glucose blood test strip Commonly known as: ONE TOUCH ULTRA TEST Use to check BG once daily.  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 Can take every 4 hours if needed.   lactulose 10 GM/15ML  solution Commonly known as: CHRONULAC Take 30 mLs (20 g total) by mouth daily. Start by taking 30 ML every 3 hours until adequate bowel movement is produced, then 30 ml nightly thereafter.   lisinopril 40 MG tablet Commonly known as: ZESTRIL Take 1 tablet (40 mg total) by mouth daily.   magnesium oxide 400 (240 Mg) MG tablet Commonly known as: MAG-OX Take 1 tablet (400 mg total) by mouth in the morning, at noon, and at bedtime.   metFORMIN 1000 MG tablet Commonly known as: GLUCOPHAGE Take 1 tablet (1,000 mg total) by mouth 2 (two) times daily with a meal.   metoprolol succinate 100 MG 24 hr tablet Commonly known as: TOPROL-XL Take 1 tablet (100 mg total) by mouth daily. Take with or immediately  following a meal.   omeprazole 20 MG capsule Commonly known as: PRILOSEC Take 1 capsule (20 mg total) by mouth daily.   ONE TOUCH ULTRA MINI w/Device Kit 1 each by Does not apply route 2 (two) times daily.   potassium chloride SA 20 MEQ tablet Commonly known as: KLOR-CON M TAKE 1  BY MOUTH ONCE DAILY Strength: 20 mEq   pravastatin 40 MG tablet Commonly known as: PRAVACHOL Take 1 tablet (40 mg total) by mouth every evening.   SV Iron 325 (65 FE) MG tablet Generic drug: ferrous sulfate Take 1 tablet by mouth once daily with breakfast   warfarin 10 MG tablet Commonly known as: Coumadin Take as directed by the anticoagulation clinic. If you are unsure how to take this medication, talk to your nurse or doctor. Original instructions: Take 1 tablet (10 mg total) by mouth daily.         Objective:   BP (!) 148/56   Pulse 80   Ht '5\' 4"'$  (1.626 m)   Wt 144 lb (65.3 kg)   SpO2 98%   BMI 24.72 kg/m   Wt Readings from Last 3 Encounters:  02/14/23 144 lb (65.3 kg)  02/08/23 145 lb 3.2 oz (65.9 kg)  01/15/23 148 lb (67.1 kg)    Physical Exam Vitals and nursing note reviewed.  Constitutional:      General: He is not in acute distress.    Appearance: He is well-developed. He is not diaphoretic.  Eyes:     General: No scleral icterus.    Conjunctiva/sclera: Conjunctivae normal.  Skin:    General: Skin is warm and dry.     Findings: No bruising or rash.  Neurological:     Mental Status: He is alert and oriented to person, place, and time.     Coordination: Coordination normal.  Psychiatric:        Behavior: Behavior normal.     Description    Decrease to take 1/2 tablet or 5 mg every Tuesday and then continue 10 mg on the rest of the week.   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  Counseling provided for all of the vaccine components Orders Placed This Encounter  Procedures  CoaguChek XS/INR Keyes, MD [facility] 02/14/2023, 1:13 PM

## 2023-02-15 ENCOUNTER — Inpatient Hospital Stay: Payer: No Typology Code available for payment source | Attending: Hematology

## 2023-02-15 ENCOUNTER — Other Ambulatory Visit (HOSPITAL_COMMUNITY): Payer: Self-pay

## 2023-02-15 VITALS — BP 139/56 | HR 69 | Temp 99.3°F | Resp 18

## 2023-02-15 DIAGNOSIS — M533 Sacrococcygeal disorders, not elsewhere classified: Secondary | ICD-10-CM | POA: Insufficient documentation

## 2023-02-15 DIAGNOSIS — E782 Mixed hyperlipidemia: Secondary | ICD-10-CM | POA: Diagnosis not present

## 2023-02-15 DIAGNOSIS — E119 Type 2 diabetes mellitus without complications: Secondary | ICD-10-CM | POA: Diagnosis not present

## 2023-02-15 DIAGNOSIS — R0602 Shortness of breath: Secondary | ICD-10-CM | POA: Insufficient documentation

## 2023-02-15 DIAGNOSIS — I809 Phlebitis and thrombophlebitis of unspecified site: Secondary | ICD-10-CM | POA: Diagnosis not present

## 2023-02-15 DIAGNOSIS — N401 Enlarged prostate with lower urinary tract symptoms: Secondary | ICD-10-CM | POA: Insufficient documentation

## 2023-02-15 DIAGNOSIS — K861 Other chronic pancreatitis: Secondary | ICD-10-CM | POA: Insufficient documentation

## 2023-02-15 DIAGNOSIS — K625 Hemorrhage of anus and rectum: Secondary | ICD-10-CM | POA: Insufficient documentation

## 2023-02-15 DIAGNOSIS — C7951 Secondary malignant neoplasm of bone: Secondary | ICD-10-CM

## 2023-02-15 DIAGNOSIS — Z8701 Personal history of pneumonia (recurrent): Secondary | ICD-10-CM | POA: Insufficient documentation

## 2023-02-15 DIAGNOSIS — Z87891 Personal history of nicotine dependence: Secondary | ICD-10-CM | POA: Insufficient documentation

## 2023-02-15 DIAGNOSIS — D649 Anemia, unspecified: Secondary | ICD-10-CM

## 2023-02-15 DIAGNOSIS — I422 Other hypertrophic cardiomyopathy: Secondary | ICD-10-CM | POA: Insufficient documentation

## 2023-02-15 DIAGNOSIS — Z7901 Long term (current) use of anticoagulants: Secondary | ICD-10-CM | POA: Insufficient documentation

## 2023-02-15 DIAGNOSIS — Z79899 Other long term (current) drug therapy: Secondary | ICD-10-CM | POA: Insufficient documentation

## 2023-02-15 DIAGNOSIS — M79606 Pain in leg, unspecified: Secondary | ICD-10-CM | POA: Diagnosis not present

## 2023-02-15 DIAGNOSIS — Z515 Encounter for palliative care: Secondary | ICD-10-CM | POA: Diagnosis not present

## 2023-02-15 DIAGNOSIS — Z9049 Acquired absence of other specified parts of digestive tract: Secondary | ICD-10-CM | POA: Insufficient documentation

## 2023-02-15 DIAGNOSIS — Z86010 Personal history of colonic polyps: Secondary | ICD-10-CM | POA: Insufficient documentation

## 2023-02-15 DIAGNOSIS — I10 Essential (primary) hypertension: Secondary | ICD-10-CM | POA: Insufficient documentation

## 2023-02-15 DIAGNOSIS — I480 Paroxysmal atrial fibrillation: Secondary | ICD-10-CM | POA: Insufficient documentation

## 2023-02-15 DIAGNOSIS — Z8719 Personal history of other diseases of the digestive system: Secondary | ICD-10-CM | POA: Insufficient documentation

## 2023-02-15 DIAGNOSIS — Z86718 Personal history of other venous thrombosis and embolism: Secondary | ICD-10-CM | POA: Insufficient documentation

## 2023-02-15 DIAGNOSIS — K219 Gastro-esophageal reflux disease without esophagitis: Secondary | ICD-10-CM | POA: Diagnosis not present

## 2023-02-15 DIAGNOSIS — C61 Malignant neoplasm of prostate: Secondary | ICD-10-CM

## 2023-02-15 MED ORDER — LORATADINE 10 MG PO TABS: 10.0000 mg | ORAL_TABLET | Freq: Once | ORAL | Status: DC

## 2023-02-15 MED ORDER — SODIUM CHLORIDE 0.9 % IV SOLN
510.0000 mg | Freq: Once | INTRAVENOUS | Status: AC
  Administered 2023-02-15: 510 mg via INTRAVENOUS
  Filled 2023-02-15: qty 510

## 2023-02-15 MED ORDER — CETIRIZINE HCL 10 MG PO TABS
10.0000 mg | ORAL_TABLET | Freq: Once | ORAL | Status: AC
  Administered 2023-02-15: 10 mg via ORAL
  Filled 2023-02-15: qty 1

## 2023-02-15 MED ORDER — ACETAMINOPHEN 325 MG PO TABS
650.0000 mg | ORAL_TABLET | Freq: Once | ORAL | Status: AC
  Administered 2023-02-15: 650 mg via ORAL
  Filled 2023-02-15: qty 2

## 2023-02-15 MED ORDER — SODIUM CHLORIDE 0.9 % IV SOLN: Freq: Once | INTRAVENOUS | Status: AC

## 2023-02-15 NOTE — Progress Notes (Signed)
Pt presents today for Feraheme IV iron per provider's order. Vital signs stable and pt voiced no new complaints at this time.  Peripheral IV started with good blood return pre and post infusion.  Feraheme given today per MD orders. Tolerated infusion without adverse affects. Vital signs stable. No complaints at this time. Discharged from clinic via wheelchair in stable condition. Alert and oriented x 3. F/U with [facility] as scheduled.

## 2023-02-15 NOTE — Patient Instructions (Signed)
[facility]  Discharge Instructions: Thank you for choosing [facility] to provide your oncology and hematology care.  If you have a lab appointment with the [facility], please come in thru the Main Entrance and check in at the main information desk.  Wear comfortable clothing and clothing appropriate for easy access to any Portacath or PICC line.   We strive to give you quality time with your provider. You may need to reschedule your appointment if you arrive late (15 or more minutes).  Arriving late affects you and other patients whose appointments are after yours.  Also, if you miss three or more appointments without notifying the office, you may be dismissed from the clinic at the provider's discretion.      For prescription refill requests, have your pharmacy contact our office and allow 72 hours for refills to be completed.    Today you received Feraheme IV iron     BELOW ARE SYMPTOMS THAT SHOULD BE REPORTED IMMEDIATELY: *FEVER GREATER THAN 100.4 F (38 C) OR HIGHER *CHILLS OR SWEATING *NAUSEA AND VOMITING THAT IS NOT CONTROLLED WITH YOUR NAUSEA MEDICATION *UNUSUAL SHORTNESS OF BREATH *UNUSUAL BRUISING OR BLEEDING *URINARY PROBLEMS (pain or burning when urinating, or frequent urination) *BOWEL PROBLEMS (unusual diarrhea, constipation, pain near the anus) TENDERNESS IN MOUTH AND THROAT WITH OR WITHOUT PRESENCE OF ULCERS (sore throat, sores in mouth, or a toothache) UNUSUAL RASH, SWELLING OR PAIN  UNUSUAL VAGINAL DISCHARGE OR ITCHING   Items with * indicate a potential emergency and should be followed up as soon as possible or go to the Emergency Department if any problems should occur.  Please show the CHEMOTHERAPY ALERT CARD or IMMUNOTHERAPY ALERT CARD at check-in to the Emergency Department and triage nurse.  Should you have questions after your visit or need to cancel or reschedule your appointment, please contact [facility] [phone]  and follow the prompts.  Office hours are 8:00 a.m. to 4:30 p.m. Monday - Friday. Please note that voicemails left after 4:00 p.m. may not be returned until the following business day.  We are closed weekends and major holidays. You have access to a nurse at all times for urgent questions. Please call the main number to the clinic [phone] and follow the prompts.  For any non-urgent questions, you may also contact your provider using MyChart. We now offer e-Visits for anyone 39 and older to request care online for non-urgent symptoms. For details visit [email or web address].   Also download the MyChart app! Go to the app store, search "MyChart", open the app, select [facility], and log in with your MyChart username and password.

## 2023-02-19 ENCOUNTER — Encounter: Payer: No Typology Code available for payment source | Admitting: Dietician

## 2023-02-20 ENCOUNTER — Other Ambulatory Visit: Payer: Self-pay

## 2023-02-20 DIAGNOSIS — D649 Anemia, unspecified: Secondary | ICD-10-CM

## 2023-02-20 DIAGNOSIS — C61 Malignant neoplasm of prostate: Secondary | ICD-10-CM

## 2023-02-20 DIAGNOSIS — C7951 Secondary malignant neoplasm of bone: Secondary | ICD-10-CM

## 2023-02-21 ENCOUNTER — Encounter: Payer: Self-pay | Admitting: *Deleted

## 2023-02-21 ENCOUNTER — Inpatient Hospital Stay: Payer: No Typology Code available for payment source

## 2023-02-21 ENCOUNTER — Inpatient Hospital Stay (HOSPITAL_BASED_OUTPATIENT_CLINIC_OR_DEPARTMENT_OTHER): Payer: No Typology Code available for payment source | Admitting: Hematology

## 2023-02-21 VITALS — BP 127/49 | HR 77 | Temp 97.5°F | Resp 18

## 2023-02-21 DIAGNOSIS — C7951 Secondary malignant neoplasm of bone: Secondary | ICD-10-CM

## 2023-02-21 DIAGNOSIS — C61 Malignant neoplasm of prostate: Secondary | ICD-10-CM

## 2023-02-21 DIAGNOSIS — D649 Anemia, unspecified: Secondary | ICD-10-CM

## 2023-02-21 LAB — CBC WITH DIFFERENTIAL/PLATELET
Abs Immature Granulocytes: 0.27 10*3/uL — ABNORMAL HIGH (ref 0.00–0.07)
Basophils Absolute: 0 10*3/uL (ref 0.0–0.1)
Basophils Relative: 0 %
Eosinophils Absolute: 0.1 10*3/uL (ref 0.0–0.5)
Eosinophils Relative: 1 %
HCT: 21.5 % — ABNORMAL LOW (ref 39.0–52.0)
Hemoglobin: 6.9 g/dL — CL (ref 13.0–17.0)
Immature Granulocytes: 5 %
Lymphocytes Relative: 15 %
Lymphs Abs: 0.9 10*3/uL (ref 0.7–4.0)
MCH: 31.2 pg (ref 26.0–34.0)
MCHC: 32.1 g/dL (ref 30.0–36.0)
MCV: 97.3 fL (ref 80.0–100.0)
Monocytes Absolute: 0.5 10*3/uL (ref 0.1–1.0)
Monocytes Relative: 8 %
Neutro Abs: 4.3 10*3/uL (ref 1.7–7.7)
Neutrophils Relative %: 71 %
Platelets: 194 10*3/uL (ref 150–400)
RBC: 2.21 MIL/uL — ABNORMAL LOW (ref 4.22–5.81)
RDW: 16.3 % — ABNORMAL HIGH (ref 11.5–15.5)
WBC: 6.1 10*3/uL (ref 4.0–10.5)
nRBC: 1.2 % — ABNORMAL HIGH (ref 0.0–0.2)

## 2023-02-21 LAB — COMPREHENSIVE METABOLIC PANEL
ALT: 15 U/L (ref 0–44)
AST: 41 U/L (ref 15–41)
Albumin: 3.1 g/dL — ABNORMAL LOW (ref 3.5–5.0)
Alkaline Phosphatase: 192 U/L — ABNORMAL HIGH (ref 38–126)
Anion gap: 12 (ref 5–15)
BUN: 22 mg/dL (ref 8–23)
CO2: 22 mmol/L (ref 22–32)
Calcium: 9.1 mg/dL (ref 8.9–10.3)
Chloride: 100 mmol/L (ref 98–111)
Creatinine, Ser: 1.19 mg/dL (ref 0.61–1.24)
GFR, Estimated: >60 mL/min (ref >60)
Glucose, Bld: 314 mg/dL — ABNORMAL HIGH (ref 70–99)
Potassium: 3.7 mmol/L (ref 3.5–5.1)
Sodium: 134 mmol/L — ABNORMAL LOW (ref 135–145)
Total Bilirubin: 0.6 mg/dL (ref 0.3–1.2)
Total Protein: 6.2 g/dL — ABNORMAL LOW (ref 6.5–8.1)

## 2023-02-21 LAB — SAMPLE TO BLOOD BANK

## 2023-02-21 LAB — PSA: Prostatic Specific Antigen: 756 ng/mL — ABNORMAL HIGH (ref 0.00–4.00)

## 2023-02-21 LAB — PREPARE RBC (CROSSMATCH)

## 2023-02-21 LAB — MAGNESIUM: Magnesium: 1.7 mg/dL (ref 1.7–2.4)

## 2023-02-21 MED ORDER — DENOSUMAB 120 MG/1.7ML ~~LOC~~ SOLN
120.0000 mg | Freq: Once | SUBCUTANEOUS | Status: AC
  Administered 2023-02-21: 120 mg via SUBCUTANEOUS
  Filled 2023-02-21: qty 1.7

## 2023-02-21 NOTE — Progress Notes (Signed)
Hemoglobin 6.9 today, Per MD, ordered one unit of blood for tomorrow.

## 2023-02-21 NOTE — Patient Instructions (Signed)
[facility]  Discharge Instructions  You were seen and examined today by Dr. Delton Coombes.  Dr. Delton Coombes has reviewed your recent mutation sequencing that was ordered. It did not reveal any useful mutations that we can treat with pills.  You are not a candidate for chemotherapy.  Dr. Delton Coombes will refer you to [facility] for ongoing management of your care. They have a palliative care program in addition to hospice, with the goal to keep you at home and comfortable for the remainder of your life.  Follow-up as scheduled.  Thank you for choosing [facility] to provide your oncology and hematology care.   To afford each patient quality time with our provider, please arrive at least 15 minutes before your scheduled appointment time. You may need to reschedule your appointment if you arrive late (10 or more minutes). Arriving late affects you and other patients whose appointments are after yours.  Also, if you miss three or more appointments without notifying the office, you may be dismissed from the clinic at the provider's discretion.    Again, thank you for choosing [facility].  Our hope is that these requests will decrease the amount of time that you wait before being seen by our physicians.   If you have a lab appointment with the [facility] please come in thru the Main Entrance and check in at the main information desk.           _____________________________________________________________  Should you have questions after your visit to [facility], please contact our office at [phone] and follow the prompts.  Our office hours are 8:00 a.m. to 4:30 p.m. Monday - Thursday and 8:00 a.m. to 2:30 p.m. Friday.  Please note that voicemails left after 4:00 p.m. may not be returned until the following business day.  We are closed weekends and all major holidays.  You do have access to a  nurse 24-7, just call the main number to the clinic [phone] and do not press any options, hold on the line and a nurse will answer the phone.    For prescription refill requests, have your pharmacy contact our office and allow 72 hours.    Masks are optional in the cancer centers. If you would like for your care team to wear a mask while they are taking care of you, please let them know. You may have one support person who is at least 80 years old accompany you for your appointments.

## 2023-02-21 NOTE — Patient Instructions (Signed)
[facility]  Discharge Instructions: Thank you for choosing [facility] to provide your oncology and hematology care.  If you have a lab appointment with the [facility], please come in thru the Main Entrance and check in at the main information desk.  Wear comfortable clothing and clothing appropriate for easy access to any Portacath or PICC line.   We strive to give you quality time with your provider. You may need to reschedule your appointment if you arrive late (15 or more minutes).  Arriving late affects you and other patients whose appointments are after yours.  Also, if you miss three or more appointments without notifying the office, you may be dismissed from the clinic at the provider's discretion.      For prescription refill requests, have your pharmacy contact our office and allow 72 hours for refills to be completed.    Today you received Xgeva 120 mg   BELOW ARE SYMPTOMS THAT SHOULD BE REPORTED IMMEDIATELY: *FEVER GREATER THAN 100.4 F (38 C) OR HIGHER *CHILLS OR SWEATING *NAUSEA AND VOMITING THAT IS NOT CONTROLLED WITH YOUR NAUSEA MEDICATION *UNUSUAL SHORTNESS OF BREATH *UNUSUAL BRUISING OR BLEEDING *URINARY PROBLEMS (pain or burning when urinating, or frequent urination) *BOWEL PROBLEMS (unusual diarrhea, constipation, pain near the anus) TENDERNESS IN MOUTH AND THROAT WITH OR WITHOUT PRESENCE OF ULCERS (sore throat, sores in mouth, or a toothache) UNUSUAL RASH, SWELLING OR PAIN  UNUSUAL VAGINAL DISCHARGE OR ITCHING   Items with * indicate a potential emergency and should be followed up as soon as possible or go to the Emergency Department if any problems should occur.  Please show the CHEMOTHERAPY ALERT CARD or IMMUNOTHERAPY ALERT CARD at check-in to the Emergency Department and triage nurse.  Should you have questions after your visit or need to cancel or reschedule your appointment, please contact [facility]  [phone]  and follow the prompts.  Office hours are 8:00 a.m. to 4:30 p.m. Monday - Friday. Please note that voicemails left after 4:00 p.m. may not be returned until the following business day.  We are closed weekends and major holidays. You have access to a nurse at all times for urgent questions. Please call the main number to the clinic [phone] and follow the prompts.  For any non-urgent questions, you may also contact your provider using MyChart. We now offer e-Visits for anyone 82 and older to request care online for non-urgent symptoms. For details visit [email or web address].   Also download the MyChart app! Go to the app store, search "MyChart", open the app, select [facility], and log in with your MyChart username and password.

## 2023-02-21 NOTE — Progress Notes (Signed)
Alan [name], [place] [address]    Clinic Day:  02/21/2023  Referring physician: Dettinger, Fransisca Kaufmann, MD  Patient Care Team: Dettinger, Fransisca Kaufmann, MD as PCP - General (Family Medicine) Belva Crome, MD (Inactive) as PCP - Cardiology (Cardiology) Gala Romney Cristopher Estimable, MD as Consulting Physician (Gastroenterology) Irine Seal, MD as Attending Physician (Urology) Dickie La, RN (Inactive) as [place] Management Forrest, Norva Riffle, LCSW as [place] Management (Licensed Clinical Social Worker) Blanca Friend, Royce Macadamia, [facility] (Pharmacist) Alan Jack, MD as Medical Oncologist (Medical Oncology)   ASSESSMENT & PLAN:   Assessment: 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  He has been on finasteride. - Biopsy on 12/24/2013: Right lateral mid-prostatic adenocarcinoma, Gleason 3+3=6 - Biopsy on 06/25/2020: Prostatic adenocarcinoma, T2b, Gleason 4+3=7, PSA 12.8 (adjusted for finasteride) - Bone scan on 07/06/2020 and CT on 07/29/2020 negative. - 11/09/2020: Insertion of radioactive I-125 seeds - PSA 5.4 (6/22), 53.2 (11/17/2021), 66.7 (11/22/2021), 382 (01/12/2022) - CTAP on 11/25/2021 with no evidence of metastatic disease.  New ill-defined soft tissue adjacent to the pancreatic body encasing the proximal splenic artery, sequela of interval pancreatitis although neoplasm is considered.  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 - Bone scan on 12/08/2021 with possible uptake in the lower thoracic vertebral body.  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 - CT chest without contrast on 01/03/2022 with no CT correlate in the lower thoracic spine.  No additional metastatic disease. - Degarelix  240 mg by Dr. Jeffie Pollock on 01/12/2022. - PSMA PET scan on 01/30/2022: Multiple skeletal metastasis in the axillary and appendicular  skeleton with nearly every vertebral body involved with carcinoma.  Broad lesions in the sacrum and pelvis.  Focal activity in the anterior apical region of the prostate gland.  No metastatic adenopathy or visceral metastasis. Alford Highland 3 tablets daily started around 02/08/2022, dose increased to 4 tablets daily on 03/21/2022. -He was hospitalized from 03/12/2022 through 03/14/2022 with pneumonia.  I have reviewed hospitalization records.  He reportedly fell on the night of 03/14/2022 when he lost balance while hanging his robe. - Erleada dose reduced to 3 tablets daily on 04/10/2022 due to diarrhea and rash.  Discontinued on 10/03/2022 due to progression.  Abiraterone and prednisone started on 10/05/2022, discontinued on 01/15/2023 due to progression.   Social/family history: - Lives with wife and granddaughter at home.  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  Quit smoking 50 years ago. - 2 sisters had brain cancer.  Maternal grandmother had cancer.  Niece had breast cancer.    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 - Germline mutation testing was negative. - I have reviewed NGS test results which showed ER positive and MMR proficient.  PD-L1 is negative.  Other testing could not be done. 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  MSI high not detected.  No other targetable mutations. - He is not a candidate for chemotherapy.  His PSA today has increased to 756 from 251 previously.. - I talked to him about best supportive care in the form of hospice.  He and his wife are agreeable.  We will put in a consult for hospice/palliative care.  2.  Bone metastasis: - He will receive his last denosumab injection today.  3.  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  This may be discontinued once he is enrolled in hospice.  4.   Normocytic anemia: - He has rectal bleeding from warfarin. - His hemoglobin today 6.9.  He will receive 1 unit PRBC.  He will also receive Feraheme.  5.  Hypomagnesemia: - He is taking magnesium 3 times daily.  Magnesium level today is normal at 1.7.  No orders of the defined types were placed in this encounter.     I,Alexis Herring,acting as a Education administrator for [employer], MD.,have documented all relevant documentation on the behalf of Alan Jack, MD,as directed by  Alan Jack, MD while in the presence of Alan Jack, MD.   I, Alan Jack MD, have reviewed the above documentation for accuracy and completeness, and I agree with the above.   Alexis Herring   3/13/20241:11 PM  CHIEF COMPLAINT:   Diagnosis: metastatic CSPC to the bones  Cancer Staging  Prostate cancer [facility]) Staging form: Prostate, AJCC 8th Edition - Clinical stage from 06/25/2020: Stage IIC (cT2b, cN0, cM0, PSA: 12.8, Grade Group: 3) - Signed by Freeman Caldron, PA-C on 09/29/2020 - Pathologic stage from 01/26/2022: Stage IVB (pT2, pN0, pM1b, Grade Group: 3) - Unsigned    Prior Therapy: Mariella Saa and Abiraterone with prednisone  Current Therapy: Hospice/palliative care   HISTORY OF PRESENT ILLNESS:   Oncology History  Prostate cancer ([place])  04/13/2014 Initial Diagnosis   Prostate cancer ([place])   06/25/2020 Cancer Staging   Staging form: Prostate, AJCC 8th Edition - Clinical stage from 06/25/2020: Stage IIC (cT2b, cN0, cM0, PSA: 12.8, Grade Group: 3) - Signed by Freeman Caldron, PA-C on 09/29/2020      INTERVAL HISTORY:   Emeril is a 82 y.o. male presenting to clinic today for follow up of metastatic CSPC to the bones. He was last seen by me on 01/15/23.  Today, he states that he is doing well overall. His appetite level is at 70%. His energy level is at 50%. He denies any bleeding per rectum or melena.  Reports pain in the back, legs and stomach region.  Also  reports easy shortness of breath.   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   found during hospital stay with pancreatitis,  started coumadin;   last Abd CT in epic 07-29-2020  chronic thrombus portal venous   GERD (gastroesophageal reflux disease)    Heart murmur    History of acute pancreatitis    2016   necrotizing pancreatitis   History of adenomatous polyp of colon    History of DVT of lower extremity    History of GI bleed 08/2019   upper gi bleed due to duodenal ulcer   History of gout yrs ago   Hypertension    followed by pcp   Hypertrophic cardiomyopathy ([place])    followed by cardiology   Internal hemorrhoids    Mixed hyperlipidemia    Normocytic anemia    PAF (paroxysmal atrial fibrillation) ([place]) 04/2016   cardiologist-- dr h. Tamala Julian--  event monitor 07-04-2017 epic, NSR/ PAflutter with occasional RVR/  PACs/ PVCs ;  echo  05-16-2017 epic,  moderate LVH with severe asymptomic septal hypertrophy, G1DD, EF 60-65%,  mild AR, mild LAE   Pancreatic pseudocyst    Peyronie's disease    Prostate cancer [facility]) urologist-- dr wrenn/ oncologist-- dr Tammi Klippel   first dx 08/ 2009 Gleason 3+3 active survelliance;  until bx 07/ 2021  Stage T2b, Gleason 4+3   Right inguinal hernia    Type 2 diabetes mellitus ([place])    followed by pcp---  (11-03-2020 per pt currently not checking blood sugar due to glucose monitor broken)   Wears glasses    Wears partial dentures    upper and lower    Surgical History: Past Surgical History:  Procedure Laterality Date   BIOPSY  02/09/2022   Procedure: BIOPSY;  Surgeon: Milus Banister, MD;  Location: [facility];  Service: Endoscopy;;   CHOLECYSTECTOMY N/A  11/26/2015   Procedure: LAPAROSCOPIC CHOLECYSTECTOMY;  Surgeon: Aviva Signs, MD;  Location: [facility];  Service: General;  Laterality: N/A;   COLONOSCOPY  last one 09-06-2020  dr stark   CYSTOSCOPY N/A 11/09/2020   Procedure: Erlene Quan;  Surgeon: Irine Seal, MD;  Location[facility];  Service: Urology;  Laterality: N/A;   ESOPHAGOGASTRODUODENOSCOPY  10-06-2019  '@[facility]'$    ESOPHAGOGASTRODUODENOSCOPY N/A 02/09/2022   Procedure: ESOPHAGOGASTRODUODENOSCOPY (EGD);  Surgeon: Milus Banister, MD;  Location: [facility];  Service: Endoscopy;  Laterality: N/A;   EUS N/A 02/09/2022   Procedure: UPPER ENDOSCOPIC ULTRASOUND (EUS) RADIAL;  Surgeon: Milus Banister, MD;  Location: [facility];  Service: Endoscopy;  Laterality: N/A;   FLEXIBLE BRONCHOSCOPY Bilateral 04/14/2016   Procedure: FLEXIBLE BRONCHOSCOPY;  Surgeon: Sinda Du, MD;  [facility];  Service: Cardiopulmonary;  Laterality: Bilateral;   HEMORRHOID SURGERY  yrs ago   RADIOACTIVE SEED IMPLANT N/A 11/09/2020   Procedure: RADIOACTIVE SEED IMPLANT/BRACHYTHERAPY IMPLANT;  Surgeon: Irine Seal, MD;  Location: [facility];  Service: Urology;  Laterality: N/A;   SPACE OAR INSTILLATION N/A 11/09/2020   Procedure: SPACE OAR INSTILLATION;  Surgeon: Irine Seal, MD;  Location: [facility];  Service: Urology;  Laterality: N/A;   UMBILICAL HERNIA REPAIR N/A 11/26/2015   Procedure: UMBILICAL HERNIORRHAPHY;  Surgeon: Aviva Signs, MD;  Location: [facility];  Service: General;  Laterality: N/A;    Social History: Social History   Socioeconomic History   Marital status: Married    Spouse name: [name]   Number of children: 4   Years of education: Not on file   Highest education level: 10th grade  Occupational History   Occupation: Retired     Fish farm manager: [employer]  Tobacco Use   Smoking status: Former    Years: 5.00    Types: Cigarettes    Quit date: 12/11/1986    Years since quitting: 36.2    Smokeless tobacco: Never  Vaping Use   Vaping Use: Never used  Substance and Sexual Activity   Alcohol use: No   Drug use: Never   Sexual activity: Yes  Other Topics Concern   Not on file  Social History Narrative   Not on file   Social Determinants of Health   Financial Resource Strain: Low Risk  (08/12/2021)   Overall Financial Resource Strain (CARDIA)    Difficulty of Paying Living Expenses: Not hard at all  Food Insecurity: No Food Insecurity (08/12/2021)   Hunger Vital Sign    Worried About Running Out of Food in the Last Year: Never true    Hermosa in the  Last Year: Never true  Transportation Needs: No Transportation Needs (08/12/2021)   PRAPARE - Hydrologist (Medical): No    Lack of Transportation (Non-Medical): No  Physical Activity: Inactive (10/25/2021)   Exercise Vital Sign    Days of Exercise per Week: 0 days    Minutes of Exercise per Session: 0 min  Stress: Stress Concern Present (10/25/2021)   [place]    Feeling of Stress : To some extent  Social Connections: Socially Integrated (08/12/2021)   Social Connection and Isolation Panel [NHANES]    Frequency of Communication with Friends and Family: Three times a week    Frequency of Social Gatherings with Friends and Family: Three times a week    Attends Religious Services: More than 4 times per year    Active Member of Clubs or Organizations: Yes    Attends Archivist Meetings: More than 4 times per year    Marital Status: Married  Human resources officer Violence: Not At Risk (08/12/2021)   Humiliation, Afraid, Rape, and Kick questionnaire    Fear of Current or Ex-Partner: No    Emotionally Abused: No    Physically Abused: No    Sexually Abused: No    Family History: Family History  Problem Relation Age of Onset   Stroke Mother    Hypertension Mother    Alzheimer's disease Mother    Hypertension  Father    Brain cancer Sister    Breast cancer Sister    Hypertension Sister    Deep vein thrombosis Brother    Hypertension Brother    Colon cancer Neg Hx    Pancreatic disease Neg Hx    Esophageal cancer Neg Hx    Rectal cancer Neg Hx    Stomach cancer Neg Hx     Current Medications:  Current Outpatient Medications:    abiraterone acetate (ZYTIGA) 250 MG tablet, Take 4 tablets (1,000 mg total) by mouth daily. Take on an empty stomach 1 hour before or 2 hours after a meal, Disp: 120 tablet, Rfl: 3   acetaminophen (TYLENOL) 500 MG tablet, Take 1,000 mg by mouth every 6 (six) hours as needed for moderate pain., Disp: , Rfl:    amLODipine (NORVASC) 10 MG tablet, Take 1 tablet (10 mg total) by mouth every evening., Disp: 90 tablet, Rfl: 3   Blood Glucose Monitoring Suppl (ONE TOUCH ULTRA MINI) w/Device KIT, 1 each by Does not apply route 2 (two) times daily., Disp: 1 kit, Rfl: 1   Blood Pressure KIT, 1 each by Does not apply route daily., Disp: 1 kit, Rfl: 0   Calcium Carb-Cholecalciferol (CALCIUM 600 + D PO), Take 1 tablet by mouth in the morning and at bedtime., Disp: , Rfl:    calcium carbonate (OSCAL) 1500 (600 Ca) MG TABS tablet, Take by mouth 2 (two) times daily with a meal., Disp: , Rfl:    chlorthalidone (HYGROTON) 25 MG tablet, Take 1 tablet by mouth once daily, Disp: 90 tablet, Rfl: 3   diclofenac Sodium (VOLTAREN) 1 % GEL, Apply 1 application topically 2 (two) times daily as needed (pain)., Disp: , Rfl:    docusate sodium (COLACE) 100 MG capsule, Take 100 mg by mouth 2 (two) times daily., Disp: , Rfl:    fluticasone (FLONASE) 50 MCG/ACT nasal spray, Place 1 spray into both nostrils 2 (two) times daily as needed for allergies or rhinitis., Disp: 16 g, Rfl: 6   glucose blood (  ONE TOUCH ULTRA TEST) test strip, Use to check BG once daily.  Dx:  Type 2 DM controlled E11.9, Disp: 100 each, Rfl: 4   HYDROcodone-acetaminophen (NORCO/VICODIN) 5-325 MG tablet, Take 1 tablet by mouth every 6  (six) hours as needed for moderate pain. Can take every 4 hours if needed., Disp: 30 tablet, Rfl: 0   lactulose (CHRONULAC) 10 GM/15ML solution, Take 30 mLs (20 g total) by mouth daily. Start by taking 30 ML every 3 hours until adequate bowel movement is produced, then 30 ml nightly thereafter., Disp: 236 mL, Rfl: 0   lisinopril (ZESTRIL) 40 MG tablet, Take 1 tablet (40 mg total) by mouth daily., Disp: 90 tablet, Rfl: 3   magnesium oxide (MAG-OX) 400 (240 Mg) MG tablet, Take 1 tablet (400 mg total) by mouth in the morning, at noon, and at bedtime., Disp: 90 tablet, Rfl: 6   metFORMIN (GLUCOPHAGE) 1000 MG tablet, Take 1 tablet (1,000 mg total) by mouth 2 (two) times daily with a meal., Disp: 180 tablet, Rfl: 3   metoprolol succinate (TOPROL-XL) 100 MG 24 hr tablet, Take 1 tablet (100 mg total) by mouth daily. Take with or immediately following a meal., Disp: 90 tablet, Rfl: 3   omeprazole (PRILOSEC) 20 MG capsule, Take 1 capsule (20 mg total) by mouth daily., Disp: 90 capsule, Rfl: 3   potassium chloride SA (KLOR-CON M) 20 MEQ tablet, TAKE 1  BY MOUTH ONCE DAILY Strength: 20 mEq, Disp: 90 tablet, Rfl: 3   pravastatin (PRAVACHOL) 40 MG tablet, Take 1 tablet (40 mg total) by mouth every evening., Disp: 90 tablet, Rfl: 3   SV IRON 325 (65 Fe) MG tablet, Take 1 tablet by mouth once daily with breakfast, Disp: 90 tablet, Rfl: 0   warfarin (COUMADIN) 10 MG tablet, Take 1 tablet (10 mg total) by mouth daily., Disp: 90 tablet, Rfl: 3   Allergies: Allergies  Allergen Reactions   Allopurinol Other (See Comments)    Significantly decreased WBC's   Colchicine Other (See Comments)    Significantly decreased WBC's    REVIEW OF SYSTEMS:   Review of Systems  Constitutional:  Negative for chills, fatigue and fever.  HENT:   Negative for lump/mass, mouth sores, nosebleeds, sore throat and trouble swallowing.   Eyes:  Negative for eye problems.  Respiratory:  Positive for shortness of breath. Negative for  cough.   Cardiovascular:  Positive for chest pain. Negative for leg swelling and palpitations.  Gastrointestinal:  Positive for constipation and diarrhea. Negative for abdominal pain, nausea and vomiting.  Genitourinary:  Negative for bladder incontinence, difficulty urinating, dysuria, frequency, hematuria and nocturia.   Musculoskeletal:  Positive for back pain. Negative for arthralgias, flank pain, myalgias and neck pain.  Skin:  Negative for itching and rash.  Neurological:  Negative for dizziness, headaches and numbness.  Hematological:  Does not bruise/bleed easily.  Psychiatric/Behavioral:  Positive for sleep disturbance. Negative for depression and suicidal ideas. The patient is not nervous/anxious.   All other systems reviewed and are negative.    VITALS:   There were no vitals taken for this visit.  Wt Readings from Last 3 Encounters:  02/14/23 65.3 kg (144 lb)  02/08/23 65.9 kg (145 lb 3.2 oz)  01/15/23 67.1 kg (148 lb)    There is no height or weight on file to calculate BMI.  Performance status (ECOG): 2 - Symptomatic, <50% confined to bed  PHYSICAL EXAM:   Physical Exam Vitals and nursing note reviewed. Exam conducted with a  chaperone present.  Constitutional:      Appearance: Normal appearance.  Cardiovascular:     Rate and Rhythm: Normal rate and regular rhythm.     Pulses: Normal pulses.     Heart sounds: Normal heart sounds.  Pulmonary:     Effort: Pulmonary effort is normal.     Breath sounds: Normal breath sounds.  Abdominal:     Palpations: Abdomen is soft. There is no hepatomegaly, splenomegaly or mass.     Tenderness: There is no abdominal tenderness.  Musculoskeletal:     Right lower leg: No edema.     Left lower leg: No edema.  Lymphadenopathy:     Cervical: No cervical adenopathy.     Right cervical: No superficial, deep or posterior cervical adenopathy.    Left cervical: No superficial, deep or posterior cervical adenopathy.     Upper Body:      Right upper body: No supraclavicular or axillary adenopathy.     Left upper body: No supraclavicular or axillary adenopathy.  Neurological:     General: No focal deficit present.     Mental Status: He is alert and oriented to person, place, and time.  Psychiatric:        Mood and Affect: Mood normal.        Behavior: Behavior normal.     LABS:      Latest Ref Rng & Units 02/08/2023   12:57 PM 01/24/2023   12:59 PM 12/26/2022    1:26 PM  CBC  WBC 4.0 - 10.5 K/uL 5.9  5.8  5.1   Hemoglobin 13.0 - 17.0 g/dL 8.3  8.6  6.2   Hematocrit 39.0 - 52.0 % 25.4  27.7  20.5   Platelets 150 - 400 K/uL 207  216  213       Latest Ref Rng & Units 02/08/2023   12:57 PM 01/24/2023   12:59 PM 12/26/2022    1:26 PM  CMP  Glucose 70 - 99 mg/dL 315  270  236   BUN 8 - 23 mg/dL 27  18  41   Creatinine 0.61 - 1.24 mg/dL 1.17  1.08  1.28   Sodium 135 - 145 mmol/L 132  133  133   Potassium 3.5 - 5.1 mmol/L 3.9  3.3  2.9   Chloride 98 - 111 mmol/L 101  97  98   CO2 22 - 32 mmol/L '20  22  20   '$ Calcium 8.9 - 10.3 mg/dL 8.2  8.8  9.2   Total Protein 6.5 - 8.1 g/dL 6.4  6.2  6.2   Total Bilirubin 0.3 - 1.2 mg/dL 0.5  0.7  0.5   Alkaline Phos 38 - 126 U/L 176  120  100   AST 15 - 41 U/L 46  38  36   ALT 0 - 44 U/L '16  13  16      '$ No results found for: "CEA1", "CEA" / No results found for: "CEA1", "CEA" Lab Results  Component Value Date   PSA1 382.0 (H) 01/12/2022   No results found for: "[id number]" No results found for: "CAN125"  No results found for: "TOTALPROTELP", "ALBUMINELP", "A1GS", "A2GS", "BETS", "BETA2SER", "GAMS", "MSPIKE", "SPEI" Lab Results  Component Value Date   TIBC 318 02/08/2023   TIBC 325 11/29/2022   TIBC 374 10/03/2022   FERRITIN 148 02/08/2023   FERRITIN 95 11/29/2022   FERRITIN 8 (L) 10/03/2022   IRONPCTSAT 11 (L) 02/08/2023   IRONPCTSAT 19 11/29/2022   IRONPCTSAT  4 (L) 10/03/2022   Lab Results  Component Value Date   LDH 188 05/22/2016   LDH 126 04/09/2016      STUDIES:   US Venous Img Upper Uni Right(DVT)  Result Date: 01/24/2023 CLINICAL DATA:  82 year old male with forearm nodule after prior IV EXAM: RIGHT UPPER EXTREMITY VENOUS DOPPLER ULTRASOUND TECHNIQUE: Gray-scale sonography with graded compression, as well as color Doppler and duplex ultrasound were performed to evaluate the upper extremity deep venous system from the level of the subclavian vein and including the jugular, axillary, basilic, radial, ulnar and upper cephalic vein. Spectral Doppler was utilized to evaluate flow at rest and with distal augmentation maneuvers. COMPARISON:  None Available. FINDINGS: Contralateral Subclavian Vein: Respiratory phasicity is normal and symmetric with the symptomatic side. No evidence of thrombus. Normal compressibility. Internal Jugular Vein: No evidence of thrombus. Normal compressibility, respiratory phasicity and response to augmentation. Subclavian Vein: No evidence of thrombus. Normal compressibility, respiratory phasicity and response to augmentation. Axillary Vein: No evidence of thrombus. Normal compressibility, respiratory phasicity and response to augmentation. Cephalic Vein: Short segment of the cephalic vein at the wrist demonstrates non compressible segment with no flow maintained. The more central cephalic vein is patent without thrombus. Basilic Vein: No evidence of thrombus. Normal compressibility, respiratory phasicity and response to augmentation. Brachial Veins: Parallel brachial veins identified. No evidence of thrombus. Normal compressibility, respiratory phasicity and response to augmentation. Radial Veins: No evidence of thrombus. Normal compressibility, respiratory phasicity and response to augmentation. Ulnar Veins: No evidence of thrombus. Normal compressibility, respiratory phasicity and response to augmentation. Other Findings:  None visualized. IMPRESSION: Directed duplex of the right upper extremity negative for DVT. Positive for  short segment superficial venous thrombophlebitis involving the distal right cephalic vein. Signed, Dulcy Fanny. Nadene Rubins, RPVI [facility] Electronically Signed   By: Corrie Mckusick D.O.   On: 01/24/2023 16:00

## 2023-02-21 NOTE — Progress Notes (Signed)
[name] presents today for injection per the provider's orders.  Xgeva administration without incident; injection site WNL; see MAR for injection details.  Patient tolerated procedure well and without incident.  No questions or complaints noted at this time. Pt's Calcium noted to be  9.1 today.   Pt will return tomorrow for 1 unit of blood due to hemoglobin of 6.9 and Feraheme IV iron per Dr.K. Pt made aware and verbalized understanding.  Discharged from clinic via wheelchair in stable condition. Alert and oriented x 3. F/U with [facility] as scheduled.

## 2023-02-21 NOTE — Progress Notes (Addendum)
CRITICAL VALUE STICKER  CRITICAL VALUE:  Hgb 6.9  RECEIVER (on-site recipient of call): Renda Rolls, RN  DATE & TIME NOTIFIED: 02/21/23 @ 1:30PM  MD NOTIFIED: 02/21/23  TIME OF NOTIFICATION: 1:30  RESPONSE:  Per Dr. Delton Coombes, will schedule for 1 unit of PRBC.

## 2023-02-22 ENCOUNTER — Inpatient Hospital Stay: Payer: No Typology Code available for payment source

## 2023-02-22 VITALS — BP 134/53 | HR 71 | Temp 98.9°F | Resp 18

## 2023-02-22 DIAGNOSIS — D649 Anemia, unspecified: Secondary | ICD-10-CM

## 2023-02-22 DIAGNOSIS — C61 Malignant neoplasm of prostate: Secondary | ICD-10-CM | POA: Diagnosis not present

## 2023-02-22 DIAGNOSIS — C7951 Secondary malignant neoplasm of bone: Secondary | ICD-10-CM

## 2023-02-22 MED ORDER — SODIUM CHLORIDE 0.9% IV SOLUTION
250.0000 mL | Freq: Once | INTRAVENOUS | Status: AC
  Administered 2023-02-22: 250 mL via INTRAVENOUS

## 2023-02-22 MED ORDER — SODIUM CHLORIDE 0.9 % IV SOLN
510.0000 mg | Freq: Once | INTRAVENOUS | Status: AC
  Administered 2023-02-22: 510 mg via INTRAVENOUS
  Filled 2023-02-22: qty 510

## 2023-02-22 MED ORDER — DIPHENHYDRAMINE HCL 25 MG PO CAPS
25.0000 mg | ORAL_CAPSULE | Freq: Once | ORAL | Status: AC
  Administered 2023-02-22: 25 mg via ORAL
  Filled 2023-02-22: qty 1

## 2023-02-22 MED ORDER — ACETAMINOPHEN 325 MG PO TABS
650.0000 mg | ORAL_TABLET | Freq: Once | ORAL | Status: AC
  Administered 2023-02-22: 650 mg via ORAL
  Filled 2023-02-22: qty 2

## 2023-02-22 MED ORDER — SODIUM CHLORIDE 0.9 % IV SOLN: Freq: Once | INTRAVENOUS | Status: AC

## 2023-02-22 NOTE — Patient Instructions (Signed)
[facility]  Discharge Instructions: Thank you for choosing [facility] to provide your oncology and hematology care.  If you have a lab appointment with the [facility], please come in thru the Main Entrance and check in at the main information desk.  Wear comfortable clothing and clothing appropriate for easy access to any Portacath or PICC line.   We strive to give you quality time with your provider. You may need to reschedule your appointment if you arrive late (15 or more minutes).  Arriving late affects you and other patients whose appointments are after yours.  Also, if you miss three or more appointments without notifying the office, you may be dismissed from the clinic at the provider's discretion.      For prescription refill requests, have your pharmacy contact our office and allow 72 hours for refills to be completed.    Today you received Feraheme IV and 1 unit of blood.     BELOW ARE SYMPTOMS THAT SHOULD BE REPORTED IMMEDIATELY: *FEVER GREATER THAN 100.4 F (38 C) OR HIGHER *CHILLS OR SWEATING *NAUSEA AND VOMITING THAT IS NOT CONTROLLED WITH YOUR NAUSEA MEDICATION *UNUSUAL SHORTNESS OF BREATH *UNUSUAL BRUISING OR BLEEDING *URINARY PROBLEMS (pain or burning when urinating, or frequent urination) *BOWEL PROBLEMS (unusual diarrhea, constipation, pain near the anus) TENDERNESS IN MOUTH AND THROAT WITH OR WITHOUT PRESENCE OF ULCERS (sore throat, sores in mouth, or a toothache) UNUSUAL RASH, SWELLING OR PAIN  UNUSUAL VAGINAL DISCHARGE OR ITCHING   Items with * indicate a potential emergency and should be followed up as soon as possible or go to the Emergency Department if any problems should occur.  Please show the CHEMOTHERAPY ALERT CARD or IMMUNOTHERAPY ALERT CARD at check-in to the Emergency Department and triage nurse.  Should you have questions after your visit or need to cancel or reschedule your appointment, please contact  [facility] [phone]  and follow the prompts.  Office hours are 8:00 a.m. to 4:30 p.m. Monday - Friday. Please note that voicemails left after 4:00 p.m. may not be returned until the following business day.  We are closed weekends and major holidays. You have access to a nurse at all times for urgent questions. Please call the main number to the clinic [phone] and follow the prompts.  For any non-urgent questions, you may also contact your provider using MyChart. We now offer e-Visits for anyone 51 and older to request care online for non-urgent symptoms. For details visit [email or web address].   Also download the MyChart app! Go to the app store, search "MyChart", open the app, select [facility], and log in with your MyChart username and password.

## 2023-02-22 NOTE — Progress Notes (Signed)
Pt presents today for Feraheme IV iron and 1 unit of blood per provider's order. Vital signs stable and pt voiced no new complaints at this time.  Peripheral IV started with good blood return pre and post infusion.  Feraheme and 1 unit of blood given today per MD orders. Tolerated infusion without adverse affects. Vital signs stable. No complaints at this time. Discharged from clinic via wheelchair in stable condition. Alert and oriented x 3. F/U with [facility] as scheduled.

## 2023-02-23 LAB — TYPE AND SCREEN
ABO/RH(D): B POS
Antibody Screen: NEGATIVE
Unit division: 0

## 2023-02-23 LAB — BPAM RBC
Blood Product Expiration Date: 202404182359
ISSUE DATE / TIME: 202403141257
Unit Type and Rh: 1700

## 2023-03-01 ENCOUNTER — Telehealth: Payer: Self-pay | Admitting: Family Medicine

## 2023-03-01 ENCOUNTER — Observation Stay (HOSPITAL_COMMUNITY)
Admission: EM | Admit: 2023-03-01 | Discharge: 2023-03-04 | Disposition: A | Discharge status: Home Health | Payer: No Typology Code available for payment source | Attending: Family Medicine | Admitting: Family Medicine

## 2023-03-01 ENCOUNTER — Encounter (HOSPITAL_COMMUNITY): Payer: Self-pay

## 2023-03-01 ENCOUNTER — Other Ambulatory Visit: Payer: Self-pay

## 2023-03-01 DIAGNOSIS — D509 Iron deficiency anemia, unspecified: Secondary | ICD-10-CM | POA: Diagnosis not present

## 2023-03-01 DIAGNOSIS — Z8546 Personal history of malignant neoplasm of prostate: Secondary | ICD-10-CM | POA: Diagnosis not present

## 2023-03-01 DIAGNOSIS — E1165 Type 2 diabetes mellitus with hyperglycemia: Secondary | ICD-10-CM | POA: Insufficient documentation

## 2023-03-01 DIAGNOSIS — D649 Anemia, unspecified: Secondary | ICD-10-CM | POA: Diagnosis not present

## 2023-03-01 DIAGNOSIS — E871 Hypo-osmolality and hyponatremia: Secondary | ICD-10-CM | POA: Diagnosis not present

## 2023-03-01 DIAGNOSIS — Z87891 Personal history of nicotine dependence: Secondary | ICD-10-CM | POA: Insufficient documentation

## 2023-03-01 DIAGNOSIS — M898X9 Other specified disorders of bone, unspecified site: Secondary | ICD-10-CM | POA: Diagnosis present

## 2023-03-01 DIAGNOSIS — K5900 Constipation, unspecified: Secondary | ICD-10-CM | POA: Insufficient documentation

## 2023-03-01 DIAGNOSIS — Z7984 Long term (current) use of oral hypoglycemic drugs: Secondary | ICD-10-CM | POA: Insufficient documentation

## 2023-03-01 DIAGNOSIS — E782 Mixed hyperlipidemia: Secondary | ICD-10-CM | POA: Diagnosis present

## 2023-03-01 DIAGNOSIS — Z7901 Long term (current) use of anticoagulants: Secondary | ICD-10-CM | POA: Insufficient documentation

## 2023-03-01 DIAGNOSIS — R791 Abnormal coagulation profile: Secondary | ICD-10-CM | POA: Diagnosis not present

## 2023-03-01 DIAGNOSIS — Z86718 Personal history of other venous thrombosis and embolism: Secondary | ICD-10-CM | POA: Insufficient documentation

## 2023-03-01 DIAGNOSIS — C61 Malignant neoplasm of prostate: Secondary | ICD-10-CM

## 2023-03-01 DIAGNOSIS — I10 Essential (primary) hypertension: Secondary | ICD-10-CM | POA: Diagnosis not present

## 2023-03-01 DIAGNOSIS — E119 Type 2 diabetes mellitus without complications: Secondary | ICD-10-CM

## 2023-03-01 DIAGNOSIS — K219 Gastro-esophageal reflux disease without esophagitis: Secondary | ICD-10-CM | POA: Diagnosis present

## 2023-03-01 DIAGNOSIS — C7951 Secondary malignant neoplasm of bone: Secondary | ICD-10-CM | POA: Diagnosis present

## 2023-03-01 DIAGNOSIS — K4090 Unilateral inguinal hernia, without obstruction or gangrene, not specified as recurrent: Secondary | ICD-10-CM

## 2023-03-01 DIAGNOSIS — N289 Disorder of kidney and ureter, unspecified: Secondary | ICD-10-CM

## 2023-03-01 DIAGNOSIS — I482 Chronic atrial fibrillation, unspecified: Secondary | ICD-10-CM | POA: Diagnosis present

## 2023-03-01 NOTE — ED Provider Notes (Incomplete)
[facility] Provider Note   CSN: [id number] Arrival date & time: 03/01/23  1829     History {Add pertinent medical, surgical, social history, OB history to HPI:1} Chief Complaint  Patient presents with  . Back Pain    Kirklan Bueno. is a 82 y.o. male.  The history is provided by the patient.  Back Pain He has history of diabetes, hyperlipidemia, paroxysmal atrial fibrillation anticoagulated on warfarin, cardiomyopathy, prostate cancer with bone metastases and was recently taken off of chemotherapy because of progression of disease and comes in because of   Home Medications Prior to Admission medications   Medication Sig Start Date End Date Taking? Authorizing Provider  abiraterone acetate (ZYTIGA) 250 MG tablet Take 4 tablets (1,000 mg total) by mouth daily. Take on an empty stomach 1 hour before or 2 hours after a meal 10/03/22   Derek Jack, MD  acetaminophen (TYLENOL) 500 MG tablet Take 1,000 mg by mouth every 6 (six) hours as needed for moderate pain.    [provider]  amLODipine (NORVASC) 10 MG tablet Take 1 tablet (10 mg total) by mouth every evening. 01/12/23   Dettinger, Fransisca Kaufmann, MD  Blood Glucose Monitoring Suppl (ONE TOUCH ULTRA MINI) w/Device KIT 1 each by Does not apply route 2 (two) times daily. 01/14/21   Dettinger, Fransisca Kaufmann, MD  Blood Pressure KIT 1 each by Does not apply route daily. 08/16/22   Dettinger, Fransisca Kaufmann, MD  Calcium Carb-Cholecalciferol (CALCIUM 600 + D PO) Take 1 tablet by mouth in the morning and at bedtime.    [provider]  calcium carbonate (OSCAL) 1500 (600 Ca) MG TABS tablet Take by mouth 2 (two) times daily with a meal.    [provider]  chlorthalidone (HYGROTON) 25 MG tablet Take 1 tablet by mouth once daily 04/17/22   Belva Crome, MD  diclofenac Sodium (VOLTAREN) 1 % GEL Apply 1 application topically 2 (two) times daily as needed (pain).    [provider]   docusate sodium (COLACE) 100 MG capsule Take 100 mg by mouth 2 (two) times daily.    [provider]  fluticasone (FLONASE) 50 MCG/ACT nasal spray Place 1 spray into both nostrils 2 (two) times daily as needed for allergies or rhinitis. 11/05/15   Dettinger, Fransisca Kaufmann, MD  glucose blood (ONE TOUCH ULTRA TEST) test strip Use to check BG once daily.  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 Can take every 4 hours if needed. 02/12/23   Harriett Rush, PA-C  lactulose (CHRONULAC) 10 GM/15ML solution Take 30 mLs (20 g total) by mouth daily. Start by taking 30 ML every 3 hours until adequate bowel movement is produced, then 30 ml nightly thereafter. 02/12/23   Derek Jack, MD  lisinopril (ZESTRIL) 40 MG tablet Take 1 tablet (40 mg total) by mouth daily. 01/12/23   Dettinger, Fransisca Kaufmann, MD  magnesium oxide (MAG-OX) 400 (240 Mg) MG tablet Take 1 tablet (400 mg total) by mouth in the morning, at noon, and at bedtime. 11/29/22   Derek Jack, MD  metFORMIN (GLUCOPHAGE) 1000 MG tablet Take 1 tablet (1,000 mg total) by mouth 2 (two) times daily with a meal. 05/29/22   Dettinger, Fransisca Kaufmann, MD  metoprolol succinate (TOPROL-XL) 100 MG 24 hr tablet Take 1 tablet (  100 mg total) by mouth daily. Take with or immediately following a meal. 01/12/23   Dettinger, Fransisca Kaufmann, MD  omeprazole (PRILOSEC) 20 MG capsule Take 1 capsule (20 mg total) by mouth daily. 01/12/23   Dettinger, Fransisca Kaufmann, MD  potassium chloride SA (KLOR-CON M) 20 MEQ tablet TAKE 1  BY MOUTH ONCE DAILY Strength: 20 mEq 01/12/23   Dettinger, Fransisca Kaufmann, MD  pravastatin (PRAVACHOL) 40 MG tablet Take 1 tablet (40 mg total) by mouth every evening. 05/29/22   Dettinger, Fransisca Kaufmann, MD  predniSONE (DELTASONE) 5 MG tablet Take 5 mg by mouth every morning. 02/17/23   [provider]  SV IRON 325  (65 Fe) MG tablet Take 1 tablet by mouth once daily with breakfast 01/29/23   Dettinger, Fransisca Kaufmann, MD  warfarin (COUMADIN) 10 MG tablet Take 1 tablet (10 mg total) by mouth daily. 08/21/22   Dettinger, Fransisca Kaufmann, MD      Allergies    Allopurinol and Colchicine    Review of Systems   Review of Systems  Musculoskeletal:  Positive for back pain.  All other systems reviewed and are negative.   Physical Exam Updated Vital Signs BP (!) 112/56   Pulse 72   Temp 98.1 F (36.7 C) (Oral)   Resp 16   Ht 5\' 4"  (1.626 m)   Wt 65.3 kg   SpO2 99%   BMI 24.72 kg/m  Physical Exam Vitals and nursing note reviewed.   82 year old male, resting comfortably and in no acute distress. Vital signs are ***. Oxygen saturation is ***%, which is normal. Head is normocephalic and atraumatic. PERRLA, EOMI. Oropharynx is clear. Neck is nontender and supple without adenopathy or JVD. Back is nontender and there is no CVA tenderness. Lungs are clear without rales, wheezes, or rhonchi. Chest is nontender. Heart has regular rate and rhythm without murmur. Abdomen is soft, flat, nontender without masses or hepatosplenomegaly and peristalsis is normoactive. Extremities have no cyanosis or edema, full range of motion is present. Skin is warm and dry without rash. Neurologic: Mental status is normal, cranial nerves are intact, there are no motor or sensory deficits.  ED Results / Procedures / Treatments   Labs (all labs ordered are listed, but only abnormal results are displayed) Labs Reviewed - No data to display  EKG None  Radiology No results found.  Procedures Procedures  {Document cardiac monitor, telemetry assessment procedure when appropriate:1}  Medications Ordered in ED Medications - No data to display  ED Course/ Medical Decision Making/ A&P   {   Click here for ABCD2, HEART and other calculatorsREFRESH Note before signing :1}                          Medical Decision  Making  ***  {Document critical care time when appropriate:1} {Document review of labs and clinical decision tools ie heart score, Chads2Vasc2 etc:1}  {Document your independent review of radiology images, and any outside records:1} {Document your discussion with family members, caretakers, and with consultants:1} {Document social determinants of health affecting pt's care:1} {Document your decision making why or why not admission, treatments were needed:1} Final Clinical Impression(s) / ED Diagnoses Final diagnoses:  None    Rx / DC Orders ED Discharge Orders     None

## 2023-03-01 NOTE — ED Provider Notes (Signed)
[facility] Provider Note   CSN: [id number] Arrival date & time: 03/01/23  1829     History  Chief Complaint  Patient presents with   Back Pain    Alan [name]. is a 82 y.o. male.  The history is provided by the patient.  Back Pain He has history of diabetes, hyperlipidemia, paroxysmal atrial fibrillation anticoagulated on warfarin, cardiomyopathy, prostate cancer with bone metastases and was recently taken off of chemotherapy because of progression of disease and comes in because of back pain which is not responding to his hydrocodone at home.  He has been getting partial relief, but it has not been sufficient.  He also had a nosebleed today which has resolved.  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   Home Medications Prior to Admission medications   Medication Sig Start Date End Date Taking? Authorizing Provider  abiraterone acetate (ZYTIGA) 250 MG tablet Take 4 tablets (1,000 mg total) by mouth daily. Take on an empty stomach 1 hour before or 2 hours after a meal 10/03/22   Derek Jack, MD  acetaminophen (TYLENOL) 500 MG tablet Take 1,000 mg by mouth every 6 (six) hours as needed for moderate pain.    [provider]  amLODipine (NORVASC) 10 MG tablet Take 1 tablet (10 mg total) by mouth every evening. 01/12/23   Dettinger, Fransisca Kaufmann, MD  Blood Glucose Monitoring Suppl (ONE TOUCH ULTRA MINI) w/Device KIT 1 each by Does not apply route 2 (two) times daily. 01/14/21   Dettinger, Fransisca Kaufmann, MD  Blood Pressure KIT 1 each by Does not apply route daily. 08/16/22   Dettinger, Fransisca Kaufmann, MD  Calcium Carb-Cholecalciferol (CALCIUM 600 + D PO) Take 1 tablet by mouth in the morning and at bedtime.    [provider]  calcium carbonate (OSCAL) 1500 (600 Ca) MG TABS tablet Take by mouth 2 (two) times  daily with a meal.    [provider]  chlorthalidone (HYGROTON) 25 MG tablet Take 1 tablet by mouth once daily 04/17/22   Belva Crome, MD  diclofenac Sodium (VOLTAREN) 1 % GEL Apply 1 application topically 2 (two) times daily as needed (pain).    [provider]  docusate sodium (COLACE) 100 MG capsule Take 100 mg by mouth 2 (two) times daily.    [provider]  fluticasone (FLONASE) 50 MCG/ACT nasal spray Place 1 spray into both nostrils 2 (two) times daily as needed for allergies or rhinitis. 11/05/15   Dettinger, Fransisca Kaufmann, MD  glucose blood (ONE TOUCH ULTRA TEST) test strip Use to check BG once daily.  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 Can take every 4 hours if needed. 02/12/23   Harriett Rush, PA-C  lactulose (CHRONULAC) 10 GM/15ML solution Take 30 mLs (20 g total) by mouth daily. Start by taking 30 ML every 3 hours until adequate bowel movement is produced, then 30 ml nightly thereafter. 02/12/23   Derek Jack, MD  lisinopril (ZESTRIL) 40 MG tablet Take 1 tablet (40 mg total) by mouth daily. 01/12/23   Dettinger, Fransisca Kaufmann, MD  magnesium oxide (MAG-OX) 400 (240 Mg) MG tablet Take 1 tablet (400 mg total) by  mouth in the morning, at noon, and at bedtime. 11/29/22   Derek Jack, MD  metFORMIN (GLUCOPHAGE) 1000 MG tablet Take 1 tablet (1,000 mg total) by mouth 2 (two) times daily with a meal. 05/29/22   Dettinger, Fransisca Kaufmann, MD  metoprolol succinate (TOPROL-XL) 100 MG 24 hr tablet Take 1 tablet (100 mg total) by mouth daily. Take with or immediately following a meal. 01/12/23   Dettinger, Fransisca Kaufmann, MD  omeprazole (PRILOSEC) 20 MG capsule Take 1 capsule (20 mg total) by mouth daily. 01/12/23   Dettinger, Fransisca Kaufmann, MD  potassium chloride SA (KLOR-CON M) 20 MEQ tablet TAKE 1  BY MOUTH ONCE DAILY  Strength: 20 mEq 01/12/23   Dettinger, Fransisca Kaufmann, MD  pravastatin (PRAVACHOL) 40 MG tablet Take 1 tablet (40 mg total) by mouth every evening. 05/29/22   Dettinger, Fransisca Kaufmann, MD  predniSONE (DELTASONE) 5 MG tablet Take 5 mg by mouth every morning. 02/17/23   [provider]  SV IRON 325 (65 Fe) MG tablet Take 1 tablet by mouth once daily with breakfast 01/29/23   Dettinger, Fransisca Kaufmann, MD  warfarin (COUMADIN) 10 MG tablet Take 1 tablet (10 mg total) by mouth daily. 08/21/22   Dettinger, Fransisca Kaufmann, MD      Allergies    Allopurinol and Colchicine    Review of Systems   Review of Systems  Musculoskeletal:  Positive for back pain.  All other systems reviewed and are negative.   Physical Exam Updated Vital Signs BP (!) 112/56   Pulse 72   Temp 98.1 F (36.7 C) (Oral)   Resp 16   Ht 5\' 4"  (1.626 m)   Wt 65.3 kg   SpO2 99%   BMI 24.72 kg/m  Physical Exam Vitals and nursing note reviewed.   82 year old male, resting comfortably and in no acute distress. Vital signs are normal. Oxygen saturation is 99%, which is normal. Head is normocephalic and atraumatic. PERRLA, EOMI. Oropharynx is clear.  Examination of the nasal cavity shows no evidence of recent bleeding. Neck is nontender and supple without adenopathy or JVD. Back is nontender and there is no CVA tenderness. Lungs are clear without rales, wheezes, or rhonchi. Chest is nontender. Heart has regular rate and rhythm without murmur. Abdomen is soft, flat, nontender. Extremities have no cyanosis or edema, full range of motion is present. Skin is warm and dry without rash. Neurologic: Mental status is normal, cranial nerves are intact, moves all extremities equally.  ED Results / Procedures / Treatments   Labs (all labs ordered are listed, but only abnormal results are displayed) Labs Reviewed  CBC WITH DIFFERENTIAL/PLATELET - Abnormal; Notable for the following components:      Result Value   RBC 1.89 (*)    Hemoglobin 6.0 (*)     HCT 18.8 (*)    RDW 19.1 (*)    Platelets 130 (*)    nRBC 1.8 (*)    Lymphs Abs 0.6 (*)    Abs Immature Granulocytes 0.15 (*)    All other components within normal limits  PROTIME-INR - Abnormal; Notable for the following components:   Prothrombin Time 69.7 (*)    INR 8.5 (*)    All other components within normal limits  BASIC METABOLIC PANEL - Abnormal; Notable for the following components:   Sodium 134 (*)    CO2 21 (*)    Glucose, Bld 151 (*)    BUN 75 (*)    Creatinine, Ser 1.34 (*)  Calcium 8.0 (*)    GFR, Estimated 54 (*)    All other components within normal limits  GLUCOSE, CAPILLARY - Abnormal; Notable for the following components:   Glucose-Capillary 131 (*)    All other components within normal limits  MRSA NEXT GEN BY PCR, NASAL  MAGNESIUM  PHOSPHORUS  TYPE AND SCREEN  PREPARE RBC (CROSSMATCH)   Procedures Procedures    Medications Ordered in ED Medications - No data to display  ED Course/ Medical Decision Making/ A&P                             Medical Decision Making Amount and/or Complexity of Data Reviewed Labs: ordered.  Risk Prescription drug management. Decision regarding hospitalization.   Back pain from metastatic prostate cancer which is not being adequately controlled with hydrocodone-acetaminophen.  Epistaxis likely related to warfarin use.  I have reviewed his past records, and on 02/21/2023 he saw his oncologist who stated he was getting his last injection of denosumab that day and is being taken off of chemotherapy because of progression of disease and recommendation was made to go into hospice.  Patient and family states that they plan to enter into hospice.  Also, note was made that warfarin can be discontinued once he is enrolled in hospice.  I discussed with the patient and family that epistaxis appears well-controlled and warfarin is likely to be discontinued on entering hospice, but family is concerned and request INR be checked.   Hemoglobin on 3/13 was 6.9, and he was given a dose of Feraheme and 1 unit of blood.  I will also check hemoglobin today.  Since patient is entering hospice, narcotic dose can be escalated to what ever is necessary for pain relief.  I am giving him a dose of hydrocodone-acetaminophen in the ED.  He had good relief of pain with above-noted treatment.  However, hemoglobin is 6.0 which is lower than it had been prior to his blood transfusion on 02/21/2023.  INR is markedly supratherapeutic at 8.5.  I feel that he needs to have correction of his elevated INR as well as blood transfusion.  Therefore, I am recommending short term hospitalization for his transfusion.  I have ordered a dose of oral vitamin K.  I do not see evidence of active hemorrhage which would require intravenous Kcentra.  I have also ordered basic metabolic panel.  I have discussed the case with Dr. Josephine Cables of [facility], who agrees to admit the patient.  I have reviewed the basic metabolic panel, and my interpretation is mild hyponatremia which is not felt to be clinically significant, elevated creatinine compared with baseline with significantly elevated BUN suggesting that there is some component of an upper GI bleed.  CRITICAL CARE Performed by: Delora Fuel Total critical care time: 50 minutes Critical care time was exclusive of separately billable procedures and treating other patients. Critical care was necessary to treat or prevent imminent or life-threatening deterioration. Critical care was time spent personally by me on the following activities: development of treatment plan with patient and/or surrogate as well as nursing, discussions with consultants, evaluation of patient's response to treatment, examination of patient, obtaining history from patient or surrogate, ordering and performing treatments and interventions, ordering and review of laboratory studies, ordering and review of radiographic studies, pulse oximetry and  re-evaluation of patient's condition.  Final Clinical Impression(s) / ED Diagnoses Final diagnoses:  Symptomatic anemia  Supratherapeutic INR  Prostate cancer  metastatic to bone [facility])  Bone pain    Rx / DC Orders ED Discharge Orders     None         Delora Fuel, MD [id number] [phone]

## 2023-03-01 NOTE — Telephone Encounter (Signed)
Patient's nose has been bleeding all morning and they are unable to get it stopped.  You do not have an appointment available for them to come in today, what do you recommend?

## 2023-03-01 NOTE — Telephone Encounter (Signed)
If we cannot get him in with somebody else today, look for cancellations but if not then they can go to the urgent care.

## 2023-03-01 NOTE — ED Triage Notes (Signed)
Pt has a hx of bones cancer and was recently taken off all cancer meds because they weren't helping.  Pt has back pain now with no relief and had a nose bleed this morning.

## 2023-03-01 NOTE — Telephone Encounter (Signed)
We do not have any openings with any provider in the office this afternoon. Appt made with Dr. Warrick Parisian on 3/22. Advised if pt felt weak, dizzy or the nose continue to bleed to take the pt to urgent care or ER. Wife understood.

## 2023-03-02 ENCOUNTER — Ambulatory Visit: Payer: No Typology Code available for payment source | Admitting: Family Medicine

## 2023-03-02 DIAGNOSIS — E782 Mixed hyperlipidemia: Secondary | ICD-10-CM | POA: Diagnosis not present

## 2023-03-02 DIAGNOSIS — C61 Malignant neoplasm of prostate: Secondary | ICD-10-CM

## 2023-03-02 DIAGNOSIS — D509 Iron deficiency anemia, unspecified: Secondary | ICD-10-CM | POA: Diagnosis present

## 2023-03-02 DIAGNOSIS — D649 Anemia, unspecified: Secondary | ICD-10-CM | POA: Diagnosis present

## 2023-03-02 DIAGNOSIS — C7951 Secondary malignant neoplasm of bone: Secondary | ICD-10-CM

## 2023-03-02 DIAGNOSIS — K219 Gastro-esophageal reflux disease without esophagitis: Secondary | ICD-10-CM

## 2023-03-02 DIAGNOSIS — I482 Chronic atrial fibrillation, unspecified: Secondary | ICD-10-CM | POA: Diagnosis not present

## 2023-03-02 DIAGNOSIS — E119 Type 2 diabetes mellitus without complications: Secondary | ICD-10-CM

## 2023-03-02 DIAGNOSIS — R791 Abnormal coagulation profile: Secondary | ICD-10-CM | POA: Diagnosis present

## 2023-03-02 DIAGNOSIS — I10 Essential (primary) hypertension: Secondary | ICD-10-CM

## 2023-03-02 DIAGNOSIS — K5900 Constipation, unspecified: Secondary | ICD-10-CM | POA: Insufficient documentation

## 2023-03-02 DIAGNOSIS — M898X9 Other specified disorders of bone, unspecified site: Secondary | ICD-10-CM | POA: Insufficient documentation

## 2023-03-02 DIAGNOSIS — K4090 Unilateral inguinal hernia, without obstruction or gangrene, not specified as recurrent: Secondary | ICD-10-CM

## 2023-03-02 LAB — CBC WITH DIFFERENTIAL/PLATELET
Abs Immature Granulocytes: 0.15 10*3/uL — ABNORMAL HIGH (ref 0.00–0.07)
Basophils Absolute: 0 10*3/uL (ref 0.0–0.1)
Basophils Relative: 0 %
Eosinophils Absolute: 0 10*3/uL (ref 0.0–0.5)
Eosinophils Relative: 0 %
HCT: 18.8 % — ABNORMAL LOW (ref 39.0–52.0)
Hemoglobin: 6 g/dL — CL (ref 13.0–17.0)
Immature Granulocytes: 3 %
Lymphocytes Relative: 14 %
Lymphs Abs: 0.6 10*3/uL — ABNORMAL LOW (ref 0.7–4.0)
MCH: 31.7 pg (ref 26.0–34.0)
MCHC: 31.9 g/dL (ref 30.0–36.0)
MCV: 99.5 fL (ref 80.0–100.0)
Monocytes Absolute: 0.3 10*3/uL (ref 0.1–1.0)
Monocytes Relative: 7 %
Neutro Abs: 3.4 10*3/uL (ref 1.7–7.7)
Neutrophils Relative %: 76 %
Platelets: 130 10*3/uL — ABNORMAL LOW (ref 150–400)
RBC: 1.89 MIL/uL — ABNORMAL LOW (ref 4.22–5.81)
RDW: 19.1 % — ABNORMAL HIGH (ref 11.5–15.5)
WBC: 4.5 10*3/uL (ref 4.0–10.5)
nRBC: 1.8 % — ABNORMAL HIGH (ref 0.0–0.2)

## 2023-03-02 LAB — BASIC METABOLIC PANEL
Anion gap: 13 (ref 5–15)
BUN: 75 mg/dL — ABNORMAL HIGH (ref 8–23)
CO2: 21 mmol/L — ABNORMAL LOW (ref 22–32)
Calcium: 8 mg/dL — ABNORMAL LOW (ref 8.9–10.3)
Chloride: 100 mmol/L (ref 98–111)
Creatinine, Ser: 1.34 mg/dL — ABNORMAL HIGH (ref 0.61–1.24)
GFR, Estimated: 54 mL/min — ABNORMAL LOW (ref >60)
Glucose, Bld: 151 mg/dL — ABNORMAL HIGH (ref 70–99)
Potassium: 4 mmol/L (ref 3.5–5.1)
Sodium: 134 mmol/L — ABNORMAL LOW (ref 135–145)

## 2023-03-02 LAB — MAGNESIUM: Magnesium: 2.2 mg/dL (ref 1.7–2.4)

## 2023-03-02 LAB — GLUCOSE, CAPILLARY
Glucose-Capillary: 131 mg/dL — ABNORMAL HIGH (ref 70–99)
Glucose-Capillary: 126 mg/dL — ABNORMAL HIGH (ref 70–99)

## 2023-03-02 LAB — PROTIME-INR
INR: 8.5 (ref 0.8–1.2)
INR: 9.3 (ref 0.8–1.2)
Prothrombin Time: 69.7 seconds — ABNORMAL HIGH (ref 11.4–15.2)
Prothrombin Time: 74.8 seconds — ABNORMAL HIGH (ref 11.4–15.2)

## 2023-03-02 LAB — MRSA NEXT GEN BY PCR, NASAL: MRSA by PCR Next Gen: NOT DETECTED

## 2023-03-02 LAB — PREPARE RBC (CROSSMATCH)

## 2023-03-02 LAB — PHOSPHORUS: Phosphorus: 3.6 mg/dL (ref 2.5–4.6)

## 2023-03-02 MED ORDER — METOPROLOL SUCCINATE ER 50 MG PO TB24
100.0000 mg | ORAL_TABLET | Freq: Every day | ORAL | Status: DC
  Administered 2023-03-02 – 2023-03-04 (×3): 100 mg via ORAL
  Filled 2023-03-02 (×3): qty 2

## 2023-03-02 MED ORDER — CALCIUM CARBONATE 1250 (500 CA) MG PO TABS
1250.0000 mg | ORAL_TABLET | Freq: Two times a day (BID) | ORAL | Status: DC
  Administered 2023-03-02 – 2023-03-04 (×6): 1250 mg via ORAL
  Filled 2023-03-02 (×6): qty 1

## 2023-03-02 MED ORDER — LACTULOSE 10 GM/15ML PO SOLN
20.0000 g | Freq: Every day | ORAL | Status: DC
  Administered 2023-03-02 – 2023-03-04 (×3): 20 g via ORAL
  Filled 2023-03-02 (×3): qty 30

## 2023-03-02 MED ORDER — ONDANSETRON HCL 4 MG PO TABS: 4.0000 mg | ORAL_TABLET | Freq: Four times a day (QID) | ORAL | Status: DC | PRN

## 2023-03-02 MED ORDER — PREDNISONE 10 MG PO TABS
5.0000 mg | ORAL_TABLET | Freq: Every morning | ORAL | Status: DC
  Administered 2023-03-02 – 2023-03-04 (×3): 5 mg via ORAL
  Filled 2023-03-02 (×3): qty 1

## 2023-03-02 MED ORDER — SODIUM CHLORIDE 0.9% IV SOLUTION: Freq: Once | INTRAVENOUS | Status: DC

## 2023-03-02 MED ORDER — HYDROCODONE-ACETAMINOPHEN 5-325 MG PO TABS
2.0000 | ORAL_TABLET | Freq: Once | ORAL | Status: AC
  Administered 2023-03-02: 2 via ORAL
  Filled 2023-03-02: qty 2

## 2023-03-02 MED ORDER — HYDROCODONE-ACETAMINOPHEN 7.5-325 MG PO TABS: 1.0000 | ORAL_TABLET | Freq: Four times a day (QID) | ORAL | 0 refills | Status: DC | PRN

## 2023-03-02 MED ORDER — PHYTONADIONE 5 MG PO TABS
5.0000 mg | ORAL_TABLET | Freq: Once | ORAL | Status: AC
  Administered 2023-03-02: 5 mg via ORAL
  Filled 2023-03-02: qty 1

## 2023-03-02 MED ORDER — HYDROCODONE-ACETAMINOPHEN 7.5-325 MG PO TABS
1.0000 | ORAL_TABLET | Freq: Four times a day (QID) | ORAL | Status: DC | PRN
  Administered 2023-03-02 – 2023-03-04 (×2): 1 via ORAL
  Filled 2023-03-02 (×2): qty 1

## 2023-03-02 MED ORDER — ONDANSETRON HCL 4 MG/2ML IJ SOLN: 4.0000 mg | Freq: Four times a day (QID) | INTRAMUSCULAR | Status: DC | PRN

## 2023-03-02 MED ORDER — ACETAMINOPHEN 650 MG RE SUPP: 650.0000 mg | Freq: Four times a day (QID) | RECTAL | Status: DC | PRN

## 2023-03-02 MED ORDER — ORAL CARE MOUTH RINSE: 15.0000 mL | OROMUCOSAL | Status: DC | PRN

## 2023-03-02 MED ORDER — ACETAMINOPHEN 325 MG PO TABS: 650.0000 mg | ORAL_TABLET | Freq: Four times a day (QID) | ORAL | Status: DC | PRN

## 2023-03-02 MED ORDER — PANTOPRAZOLE SODIUM 40 MG PO TBEC
40.0000 mg | DELAYED_RELEASE_TABLET | Freq: Every day | ORAL | Status: DC
  Administered 2023-03-02: 40 mg via ORAL
  Filled 2023-03-02: qty 1

## 2023-03-02 MED ORDER — DOCUSATE SODIUM 100 MG PO CAPS
100.0000 mg | ORAL_CAPSULE | Freq: Two times a day (BID) | ORAL | Status: DC
  Administered 2023-03-02 – 2023-03-04 (×4): 100 mg via ORAL
  Filled 2023-03-02 (×4): qty 1

## 2023-03-02 MED ORDER — PRAVASTATIN SODIUM 40 MG PO TABS
40.0000 mg | ORAL_TABLET | Freq: Every evening | ORAL | Status: DC
  Administered 2023-03-02 – 2023-03-04 (×3): 40 mg via ORAL
  Filled 2023-03-02 (×3): qty 1

## 2023-03-02 MED ORDER — CHLORHEXIDINE GLUCONATE CLOTH 2 % EX PADS
6.0000 | MEDICATED_PAD | Freq: Every day | CUTANEOUS | Status: DC
  Administered 2023-03-02 – 2023-03-04 (×3): 6 via TOPICAL

## 2023-03-02 MED ORDER — PANTOPRAZOLE SODIUM 40 MG PO TBEC
40.0000 mg | DELAYED_RELEASE_TABLET | Freq: Two times a day (BID) | ORAL | Status: DC
  Administered 2023-03-02 – 2023-03-04 (×4): 40 mg via ORAL
  Filled 2023-03-02 (×5): qty 1

## 2023-03-02 MED ORDER — VITAMIN K1 10 MG/ML IJ SOLN
5.0000 mg | Freq: Once | INTRAVENOUS | Status: AC
  Administered 2023-03-02: 5 mg via INTRAVENOUS
  Filled 2023-03-02: qty 0.5

## 2023-03-02 MED ORDER — FERROUS SULFATE 325 (65 FE) MG PO TABS
325.0000 mg | ORAL_TABLET | ORAL | Status: DC
  Administered 2023-03-02 – 2023-03-04 (×2): 325 mg via ORAL
  Filled 2023-03-02 (×3): qty 1

## 2023-03-02 NOTE — ED Notes (Signed)
Date and time results received: 03/02/23 0102 (use smartphrase ".now" to insert current time)  Test: Hgb Critical Value: 6.0  Name of Provider Notified: Peggye Pitt, MD

## 2023-03-02 NOTE — H&P (Signed)
History and Physical    Patient: Alan [name]. [id number] DOB: [birth year] DOA: 03/01/2023 DOS: the patient was seen and examined on 03/02/2023 PCP: Dettinger, Fransisca Kaufmann, MD  Patient coming from: Home  Chief Complaint:  Chief Complaint  Patient presents with   Back Pain   HPI: [name]. is a 82 y.o. 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  Patient was recently taking off chemotherapy due to disease progression.  He complained of back pain not responding to home hydrocodone, he was still able to obtain partial relief from the medication.  Patient also complaining of spontaneous nosebleed yesterday, but this has since resolved.  He complained of chronic constipation which was related to opioid use, but denies bowel or bladder problems.  ED Course:  In the emergency department, patient was hemodynamically stable, BP was 112/56 and other vital signs are within normal range.  Workup in the ED shows CBC with WBC of 4.5, hemoglobin 6.0, hematocrit 18.8, MCV 99.5, platelets 130, INR 8.5, BMP showed sodium of 134, potassium 4.0, chloride 100, bicarb 21, blood glucose 151, BUN 75, creatinine 1.34 (baseline creatinine at 1.1-1.2). Patient was treated with 2 tablets of Norco 5-3 25, vitamin K was given, 2 units of PRBC was ordered due to hemoglobin less than 7.  Hospitalist was asked to admit patient for further evaluation and management.  Review of Systems: Review of systems as noted in the HPI. All other systems reviewed and are negative.   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   found during hospital stay with pancreatitis,  started coumadin;   last Abd CT in epic 07-29-2020  chronic thrombus portal venous   GERD (gastroesophageal reflux disease)    Heart murmur    History of acute pancreatitis    2016   necrotizing pancreatitis   History of adenomatous polyp of colon    History of DVT of lower extremity    History of GI bleed 08/2019   upper gi bleed due to duodenal ulcer   History of gout yrs ago   Hypertension    followed by pcp   Hypertrophic cardiomyopathy ([place])    followed by cardiology   Internal hemorrhoids    Mixed hyperlipidemia    Normocytic anemia    PAF (paroxysmal atrial fibrillation) ([place]) 04/2016   cardiologist-- dr h. Tamala Julian--  event monitor 07-04-2017 epic, NSR/ PAflutter with occasional RVR/  PACs/ PVCs ;  echo 05-16-2017 epic,  moderate LVH with severe asymptomic septal hypertrophy, G1DD, EF 60-65%,  mild AR, mild LAE   Pancreatic pseudocyst    Peyronie's disease    Prostate cancer [facility]) urologist-- dr wrenn/ oncologist-- dr Tammi Klippel   first dx 08/ 2009 Gleason 3+3 active survelliance;  until bx 07/ 2021  Stage T2b, Gleason 4+3   Right inguinal hernia    Type 2 diabetes mellitus ([place])    followed by pcp---  (11-03-2020 per pt currently not checking blood sugar due to glucose monitor broken)   Wears glasses    Wears partial dentures  upper and lower   Past Surgical History:  Procedure Laterality Date   BIOPSY  02/09/2022   Procedure: BIOPSY;  Surgeon: Milus Banister, MD;  Location: [facility];  Service: Endoscopy;;   CHOLECYSTECTOMY N/A 11/26/2015   Procedure: LAPAROSCOPIC CHOLECYSTECTOMY;  Surgeon: Aviva Signs, MD;  Location: [facility];  Service: General;  Laterality: N/A;   COLONOSCOPY  last one 09-06-2020  dr stark   CYSTOSCOPY N/A 11/09/2020   Procedure: Erlene Quan;  Surgeon: Irine Seal, MD;  Location: [facility];  Service: Urology;  Laterality: N/A;    ESOPHAGOGASTRODUODENOSCOPY  10-06-2019  @[facility]    ESOPHAGOGASTRODUODENOSCOPY N/A 02/09/2022   Procedure: ESOPHAGOGASTRODUODENOSCOPY (EGD);  Surgeon: Milus Banister, MD;  Location: [facility];  Service: Endoscopy;  Laterality: N/A;   EUS N/A 02/09/2022   Procedure: UPPER ENDOSCOPIC ULTRASOUND (EUS) RADIAL;  Surgeon: Milus Banister, MD;  Location: [facility];  Service: Endoscopy;  Laterality: N/A;   FLEXIBLE BRONCHOSCOPY Bilateral 04/14/2016   Procedure: FLEXIBLE BRONCHOSCOPY;  Surgeon: Sinda Du, MD;  [facility];  Service: Cardiopulmonary;  Laterality: Bilateral;   HEMORRHOID SURGERY  yrs ago   RADIOACTIVE SEED IMPLANT N/A 11/09/2020   Procedure: RADIOACTIVE SEED IMPLANT/BRACHYTHERAPY IMPLANT;  Surgeon: Irine Seal, MD;  Location: [facility];  Service: Urology;  Laterality: N/A;   SPACE OAR INSTILLATION N/A 11/09/2020   Procedure: SPACE OAR INSTILLATION;  Surgeon: Irine Seal, MD;  Location: [facility];  Service: Urology;  Laterality: N/A;   UMBILICAL HERNIA REPAIR N/A 11/26/2015   Procedure: UMBILICAL HERNIORRHAPHY;  Surgeon: Aviva Signs, MD;  Location: [facility];  Service: General;  Laterality: N/A;    Social History:  reports that he quit smoking about 36 years ago. His smoking use included cigarettes. He has never used smokeless tobacco. He reports that he does not drink alcohol and does not use drugs.   Allergies  Allergen Reactions   Allopurinol Other (See Comments)    Significantly decreased WBC's   Colchicine Other (See Comments)    Significantly decreased WBC's    Family History  Problem Relation Age of Onset   Stroke Mother    Hypertension Mother    Alzheimer's disease Mother    Hypertension Father    Brain cancer Sister    Breast cancer Sister    Hypertension Sister    Deep vein thrombosis Brother    Hypertension Brother    Colon cancer Neg Hx    Pancreatic disease Neg Hx    Esophageal cancer Neg Hx    Rectal cancer Neg  Hx    Stomach cancer Neg Hx      Prior to Admission medications   Medication Sig Start Date End Date Taking? Authorizing Provider  HYDROcodone-acetaminophen (NORCO) 7.5-325 MG tablet Take 1 tablet by mouth every 6 (six) hours as needed for moderate pain. [id number]  Yes Delora Fuel, MD  abiraterone acetate (ZYTIGA) 250 MG tablet Take 4 tablets (1,000 mg total) by mouth daily. Take on an empty stomach 1 hour before or 2 hours after a meal 10/03/22   Derek Jack, MD  acetaminophen (TYLENOL) 500 MG tablet Take 1,000 mg by mouth every 6 (six) hours as needed for moderate pain.    [provider]  amLODipine (NORVASC) 10 MG tablet Take 1 tablet (10 mg total) by mouth every evening. 01/12/23   Dettinger, Fransisca Kaufmann, MD  Blood Glucose Monitoring Suppl (ONE TOUCH ULTRA MINI) w/Device KIT 1 each by Does not apply route 2 (two) times daily. 01/14/21  Dettinger, Fransisca Kaufmann, MD  Blood Pressure KIT 1 each by Does not apply route daily. 08/16/22   Dettinger, Fransisca Kaufmann, MD  Calcium Carb-Cholecalciferol (CALCIUM 600 + D PO) Take 1 tablet by mouth in the morning and at bedtime.    [provider]  calcium carbonate (OSCAL) 1500 (600 Ca) MG TABS tablet Take by mouth 2 (two) times daily with a meal.    [provider]  chlorthalidone (HYGROTON) 25 MG tablet Take 1 tablet by mouth once daily 04/17/22   Belva Crome, MD  diclofenac Sodium (VOLTAREN) 1 % GEL Apply 1 application topically 2 (two) times daily as needed (pain).    [provider]  docusate sodium (COLACE) 100 MG capsule Take 100 mg by mouth 2 (two) times daily.    [provider]  fluticasone (FLONASE) 50 MCG/ACT nasal spray Place 1 spray into both nostrils 2 (two) times daily as needed for allergies or rhinitis. 11/05/15   Dettinger, Fransisca Kaufmann, MD  glucose blood (ONE TOUCH ULTRA TEST) test strip Use to check BG once daily.  Dx:  Type 2 DM controlled E11.9 01/12/23   Dettinger, Fransisca Kaufmann, MD  lactulose (CHRONULAC) 10  GM/15ML solution Take 30 mLs (20 g total) by mouth daily. Start by taking 30 ML every 3 hours until adequate bowel movement is produced, then 30 ml nightly thereafter. 02/12/23   Derek Jack, MD  lisinopril (ZESTRIL) 40 MG tablet Take 1 tablet (40 mg total) by mouth daily. 01/12/23   Dettinger, Fransisca Kaufmann, MD  magnesium oxide (MAG-OX) 400 (240 Mg) MG tablet Take 1 tablet (400 mg total) by mouth in the morning, at noon, and at bedtime. 11/29/22   Derek Jack, MD  metFORMIN (GLUCOPHAGE) 1000 MG tablet Take 1 tablet (1,000 mg total) by mouth 2 (two) times daily with a meal. 05/29/22   Dettinger, Fransisca Kaufmann, MD  metoprolol succinate (TOPROL-XL) 100 MG 24 hr tablet Take 1 tablet (100 mg total) by mouth daily. Take with or immediately following a meal. 01/12/23   Dettinger, Fransisca Kaufmann, MD  omeprazole (PRILOSEC) 20 MG capsule Take 1 capsule (20 mg total) by mouth daily. 01/12/23   Dettinger, Fransisca Kaufmann, MD  potassium chloride SA (KLOR-CON M) 20 MEQ tablet TAKE 1  BY MOUTH ONCE DAILY Strength: 20 mEq 01/12/23   Dettinger, Fransisca Kaufmann, MD  pravastatin (PRAVACHOL) 40 MG tablet Take 1 tablet (40 mg total) by mouth every evening. 05/29/22   Dettinger, Fransisca Kaufmann, MD  predniSONE (DELTASONE) 5 MG tablet Take 5 mg by mouth every morning. 02/17/23   [provider]  SV IRON 325 (65 Fe) MG tablet Take 1 tablet by mouth once daily with breakfast 01/29/23   Dettinger, Fransisca Kaufmann, MD  warfarin (COUMADIN) 10 MG tablet Take 1 tablet (10 mg total) by mouth daily. 08/21/22   Dettinger, Fransisca Kaufmann, MD    Physical Exam: BP (!) 116/50   Pulse 69   Temp 98.3 F (36.8 C) (Oral)   Resp 15   Ht 5\' 5"  (1.651 m)   Wt 61 kg   SpO2 100%   BMI 22.38 kg/m   General: 82 y.o. year-old male well developed well nourished in no acute distress.  Alert and oriented x3. HEENT: NCAT, EOMI Neck: Supple, trachea medial Cardiovascular: Regular rate and rhythm with no rubs or gallops.  No thyromegaly or JVD noted.  No lower extremity edema.  2/4 pulses in all 4 extremities. Respiratory: Clear to auscultation with no wheezes or rales. Good  inspiratory effort. Abdomen: Soft, nontender nondistended with normal bowel sounds x4 quadrants. Muskuloskeletal: Noted right inguinal hernia.  No cyanosis, clubbing or edema noted bilaterally Neuro: CN II-XII intact, strength 5/5 x 4, sensation, reflexes intact Skin: No ulcerative lesions noted or rashes Psychiatry: Judgement and insight appear normal. Mood is appropriate for condition and setting          Labs on Admission:  Basic Metabolic Panel: Recent Labs  Lab 03/02/23 0153  NA 134*  K 4.0  CL 100  CO2 21*  GLUCOSE 151*  BUN 75*  CREATININE 1.34*  CALCIUM 8.0*   Liver Function Tests: No results for input(s): "AST", "ALT", "ALKPHOS", "BILITOT", "PROT", "ALBUMIN" in the last 168 hours. No results for input(s): "LIPASE", "AMYLASE" in the last 168 hours. No results for input(s): "AMMONIA" in the last 168 hours. CBC: Recent Labs  Lab 03/02/23 0024  WBC 4.5  NEUTROABS 3.4  HGB 6.0*  HCT 18.8*  MCV 99.5  PLT 130*   Cardiac Enzymes: No results for input(s): "CKTOTAL", "CKMB", "CKMBINDEX", "TROPONINI" in the last 168 hours.  BNP (last 3 results) No results for input(s): "BNP" in the last 8760 hours.  ProBNP (last 3 results) No results for input(s): "PROBNP" in the last 8760 hours.  CBG: Recent Labs  Lab 03/02/23 0413  GLUCAP 131*    Radiological Exams on Admission: No results found.  EKG: I independently viewed the EKG done and my findings are as followed: EKG was not done in the ED  Assessment/Plan Present on Admission:  Symptomatic anemia  Prostate cancer ([place])  Malignant neoplasm metastatic to bone [facility])  Essential hypertension  Mixed hyperlipidemia  GERD (gastroesophageal reflux disease)  Principal Problem:   Symptomatic anemia Active Problems:   Mixed hyperlipidemia   Prostate cancer ([facility])   GERD (gastroesophageal reflux disease)   Essential  hypertension   Non-insulin dependent type 2 diabetes mellitus ([facility])   Malignant neoplasm metastatic to bone ([facility])   Supratherapeutic INR   Iron deficiency anemia   Atrial fibrillation, chronic ([facility])   Bone pain   Constipation   Right inguinal hernia  Symptomatic anemia H/H= 6.0/18.8, this was 6.9/21.5 on 02/21/2023 Type and crossmatch was done 2 units of PRBC was ordered to be transfused in the ED Continue to monitor H/H  Supratherapeutic INR Patient was on warfarin and INR was 8.5 Vitamin K was given in the ED Warfarin will be held at this time Continue to monitor INR  Prostate cancer with metastasis to bone Bone pain Continue Norco as needed Continue prednisone Continue Os-Cal  Thrombocytopenia possibly reactive Platelets 130, continue to monitor platelet levels  Iron deficiency anemia Continue ferrous sulfate  Right inguinal hernia Asymptomatic, continue to monitor  T2DM with hyperglycemia Hemoglobin A1c was 5.7 about a month ago Continue diet modification at this time Metformin will be held at this time  Essential hypertension Continue metoprolol  Mixed hyperlipidemia Continue all protocol  Chronic atrial fibrillation Continue Toprol-XL Warfarin will be held at this time  GERD Continue Protonix  Constipation Continue Colace, lactulose   DVT prophylaxis: SCDs  Code Status: Full code  Consults: None  Family Communication: None at bedside  Severity of Illness: The appropriate patient status for this patient is OBSERVATION. Observation status is judged to be reasonable and necessary in order to provide the required intensity of service to ensure the patient's safety. The patient's presenting symptoms, physical exam findings, and initial radiographic and laboratory data in the context of their medical condition is felt to place  them at decreased risk for further clinical deterioration. Furthermore, it is anticipated that the patient will be medically  stable for discharge from the hospital within 2 midnights of admission.   Author: Bernadette Hoit, DO 03/02/2023 5:38 AM  For on call review [email or web address].

## 2023-03-02 NOTE — Progress Notes (Signed)
Date and time results received: 03/02/23 1755 (use smartphrase ".now" to insert current time)  Test: INR Critical Value: 9.3  Name of Provider Notified: Courage, MD.  Orders Received? Or Actions Taken?:  Vitamin K and FFP ordered.

## 2023-03-02 NOTE — Care Management Obs Status (Signed)
[place] NOTIFICATION   Patient Details  Name: Alan [name]. MRN: [id number] Date of Birth: 07-10-1941   Medicare Observation Status Notification Given:  Yes    Shade Flood, LCSW 03/02/2023, 3:53 PM

## 2023-03-02 NOTE — Progress Notes (Signed)
Patient seen and evaluated, chart reviewed, please see EMR for updated orders. Please see full H&P dictated by admitting physician Dr Josephine Cables for same date of service.   Brief Summary:-  82 y.o. 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, hypertension, T2DM admitted on 03/02/2023 with hemoglobin of 6.0 and INR at 8.5 with concerns for possible GI bleed   A/p 1) supratherapeutic INR--INR on admission was 8.4 currently up to 9.3 despite vitamin K -Will repeat vitamin K and give 1 unit of FFP -Patient on chronic anticoagulation with warfarin PTA due to history of chronic A-fib and unprovoked right lower extremity DVT previously -Watch for bleeding concerns  2) acute on chronic symptomatic anemia--need to rule out GI bleeding Hgb is down to 6.0 -Transfused 2 units of PRBC -Watch for bleeding -Continue Protonix  3)Prostate cancer with metastasis to bone Bone pain Continue Norco as needed Continue prednisone  4) chronic A-fib--- continue Toprol-XL for rate control, hold Coumadin due to supratherapeutic INR  Total care time over 47 minutes - High risk for bleeding  Roxan Hockey, MD

## 2023-03-02 NOTE — Discharge Instructions (Signed)
Food Resources  Agency Name: [facility] Address: [address] [place], [place] [address] Phone: [phone] Website: www.[email or web address] Services Offered: Meals on [employer] and Meals with Friends.  Home care, at home assisted living, [place]  for [place], transportation   Agency Name: [facility] Address: Sites vary. Must call first. Food Pantry location: [address], [place], [place] [address] [employer] Phone: [phone] Website: none Services Offered: Building services engineer, utility assistance if funds available Scientist, forensic for all of [place], [place], [place] for [facility] area only) Walk-in  current Id and current address verification required.  Wed-Thurs: 9:30-12:00  Agency Name: [facility] Address: [address] [place], [place] [address]  Phone: [phone] or [phone] Website: none Services Offered: Food assistance Agency Name: Sandrea Matte Address: [address] [place], [place] [address]  Phone: [phone] or [phone]  Website: none Services Offered: Serves 1 hot meal a day at 11:00 am Monday-Sunday and 5 pm  on the second and fourth Sunday of each month  Agency Name: [place] [facility] and [facility]  Services/Social Services  Address: [place], [place], [place] [address] Phone: [phone] Website: [email or web address]  [email or web address] Services Offered: Physicist, medical, [facility] program Agency Name: [facility] Address: [address] [place], [place] [address] Phone: [phone] Website: [email or web address] Services Offered: Food pantry Tuesday, Wednesday and Thursday 9am-11:30am  (need appointment) and health clinic (9:00am-11:00am)  Agency Name: [employer] [facility] Address: [address], [place] / [address]  [place] Phone: [phone] [place] / [phone] [place] Website: [email or web address] Services Offered: Games developer, food pantry, soup kitchen Orthoptist) emergency financial  assistance, thrift stores, showers & hygiene products [employer]),

## 2023-03-02 NOTE — TOC Progression Note (Signed)
  Transition of Care [facility]) Screening Note   Patient Details  Name: Alan [name]. Date of Birth: 1941/06/10   Transition of Care [facility]) CM/SW Contact:    Shade Flood, LCSW Phone Number: 03/02/2023, 3:53 PM    Transition of Care Department [facility]) has reviewed patient and no TOC needs have been identified at this time. We will continue to monitor patient advancement through interdisciplinary progression rounds. If new patient transition needs arise, please place a TOC consult.

## 2023-03-03 DIAGNOSIS — D649 Anemia, unspecified: Secondary | ICD-10-CM | POA: Diagnosis not present

## 2023-03-03 LAB — BPAM RBC
Blood Product Expiration Date: 202404192359
Blood Product Expiration Date: 202404202359
ISSUE DATE / TIME: 202403220408
ISSUE DATE / TIME: 202403220750
Unit Type and Rh: 1700
Unit Type and Rh: 1700

## 2023-03-03 LAB — PREPARE FRESH FROZEN PLASMA: Unit division: 0

## 2023-03-03 LAB — COMPREHENSIVE METABOLIC PANEL
ALT: 18 U/L (ref 0–44)
AST: 60 U/L — ABNORMAL HIGH (ref 15–41)
Albumin: 3.1 g/dL — ABNORMAL LOW (ref 3.5–5.0)
Alkaline Phosphatase: 211 U/L — ABNORMAL HIGH (ref 38–126)
Anion gap: 10 (ref 5–15)
BUN: 51 mg/dL — ABNORMAL HIGH (ref 8–23)
CO2: 22 mmol/L (ref 22–32)
Calcium: 8.3 mg/dL — ABNORMAL LOW (ref 8.9–10.3)
Chloride: 102 mmol/L (ref 98–111)
Creatinine, Ser: 1.03 mg/dL (ref 0.61–1.24)
GFR, Estimated: >60 mL/min (ref >60)
Glucose, Bld: 201 mg/dL — ABNORMAL HIGH (ref 70–99)
Potassium: 3.3 mmol/L — ABNORMAL LOW (ref 3.5–5.1)
Sodium: 134 mmol/L — ABNORMAL LOW (ref 135–145)
Total Bilirubin: 0.9 mg/dL (ref 0.3–1.2)
Total Protein: 6 g/dL — ABNORMAL LOW (ref 6.5–8.1)

## 2023-03-03 LAB — PROTIME-INR
INR: 1.5 — ABNORMAL HIGH (ref 0.8–1.2)
Prothrombin Time: 18.3 seconds — ABNORMAL HIGH (ref 11.4–15.2)

## 2023-03-03 LAB — BPAM FFP
Blood Product Expiration Date: 202403272359
ISSUE DATE / TIME: 202403222052
Unit Type and Rh: 7300

## 2023-03-03 LAB — TYPE AND SCREEN
ABO/RH(D): B POS
Antibody Screen: NEGATIVE
Unit division: 0
Unit division: 0

## 2023-03-03 LAB — CBC
HCT: 24.1 % — ABNORMAL LOW (ref 39.0–52.0)
Hemoglobin: 8 g/dL — ABNORMAL LOW (ref 13.0–17.0)
MCH: 30.8 pg (ref 26.0–34.0)
MCHC: 33.2 g/dL (ref 30.0–36.0)
MCV: 92.7 fL (ref 80.0–100.0)
Platelets: 108 10*3/uL — ABNORMAL LOW (ref 150–400)
RBC: 2.6 MIL/uL — ABNORMAL LOW (ref 4.22–5.81)
RDW: 18.9 % — ABNORMAL HIGH (ref 11.5–15.5)
WBC: 3.9 10*3/uL — ABNORMAL LOW (ref 4.0–10.5)
nRBC: 1 % — ABNORMAL HIGH (ref 0.0–0.2)

## 2023-03-03 MED ORDER — ENOXAPARIN SODIUM 40 MG/0.4ML IJ SOSY
40.0000 mg | PREFILLED_SYRINGE | Freq: Every day | INTRAMUSCULAR | Status: DC
  Administered 2023-03-03 – 2023-03-04 (×2): 40 mg via SUBCUTANEOUS
  Filled 2023-03-03 (×2): qty 0.4

## 2023-03-03 MED ORDER — POTASSIUM CHLORIDE CRYS ER 20 MEQ PO TBCR
40.0000 meq | EXTENDED_RELEASE_TABLET | ORAL | Status: AC
  Administered 2023-03-03 (×2): 40 meq via ORAL
  Filled 2023-03-03 (×2): qty 2

## 2023-03-03 MED ORDER — WARFARIN SODIUM 7.5 MG PO TABS
12.5000 mg | ORAL_TABLET | Freq: Once | ORAL | Status: AC
  Administered 2023-03-03: 12.5 mg via ORAL
  Filled 2023-03-03: qty 1

## 2023-03-03 MED ORDER — WARFARIN - PHARMACIST DOSING INPATIENT: Freq: Every day | Status: DC

## 2023-03-03 NOTE — Progress Notes (Signed)
PROGRESS NOTE     Alan [name], is a 82 y.o. male, DOB - 1941-02-10, [id number]  Admit date - 03/01/2023   Admitting Physician Alan Hoit, DO  Outpatient Primary MD for the patient is Dettinger, Fransisca Kaufmann, MD  LOS - 0  Chief Complaint  Patient presents with   Back Pain        Brief Narrative:  82 y.o. 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, hypertension, T2DM admitted on 03/02/2023 with hemoglobin of 6.0 and INR at 8.5 with concerns for possible GI bleed      -Assessment and Plan: A/p 1)Supratherapeutic INR with ABLA INR on admission was 8.4 -INR peaked at 9.3 on 03/02/2023 and was trending up despite vitamin K -Patient had nosebleed and dropping hemoglobin required  2 units of PRBC -Patient received additional vitamin K and FFP on 03/01/2022 -Patient on chronic anticoagulation with warfarin PTA due to history of chronic A-fib and unprovoked right lower extremity DVT/hx splenic thrombus previously 03/03/23- INR is down to 1.5 -Anticoagulation as below #5   2)Acute on chronic symptomatic anemia--patient had recurrent nosebleeds prior to admission -need to rule out GI bleeding -03/03/23 -Hgb is up to 8.0 from 6.0 after transfusion of 2 units of PRBC on 03/02/2023 -Continue Protonix   3)Prostate cancer with metastasis to bone Bone pain is Not worse Continue Norco as needed Continue prednisone   4) chronic A-fib--- continue Toprol-XL for rate control,  -Coumadin as below #5  5) chronic anticoagulation due to chronic atrial fibrillation, history of unprovoked right leg DVT and hx splenic thrombus as well as malignancy induced hypercoagulable state-- 03/03/23 -INR is down to 1.5 from 9.3 after vitamin K and FFP -Give Lovenox prophylactically -Coumadin per pharmacy -Continue anticoagulation at this time until  patient and family decides to transition to hospice  6)Social/Ethics---Discussed with pt, his wife, son Alan [name]) and Daughter Alan [name] by phone,  -Patient remains a full code at this time -Family conference at discharge on 4 03/04/2023 to discuss goals of care and advanced directives -  Disposition: The patient is from: Home              Anticipated d/c is to: Home              Anticipated d/c date is: 1 day              Patient currently is not medically stable to d/c. Barriers: Not Clinically Stable-   Code Status :  -  Code Status: Full Code   Family Communication:   his wife in person, son Alan [name]) and Daughter Alan [name] by phone,   DVT Prophylaxis  :   - SCDs   enoxaparin (LOVENOX) injection 40 mg Start: 03/03/23 1245 SCDs Start: 03/02/23 0456 warfarin (COUMADIN) tablet 12.5 mg   Lab Results  Component Value Date   PLT 108 (L) 03/03/2023    Inpatient Medications  Scheduled Meds:  sodium chloride   Intravenous Once   sodium chloride   Intravenous Once   calcium carbonate  1,250 mg Oral BID WC   Chlorhexidine Gluconate Cloth  6 each Topical Q0600   docusate sodium  100 mg Oral BID   enoxaparin (LOVENOX) injection  40 mg Subcutaneous Daily   ferrous sulfate  325 mg Oral QODAY   lactulose  20 g Oral Daily   metoprolol succinate  100 mg Oral Daily  pantoprazole  40 mg Oral BID   potassium chloride  40 mEq Oral Q3H   pravastatin  40 mg Oral QPM   predniSONE  5 mg Oral q morning   warfarin  12.5 mg Oral ONCE-1600   Warfarin - Pharmacist Dosing Inpatient   Does not apply q1600   Continuous Infusions: PRN Meds:.acetaminophen **OR** acetaminophen, HYDROcodone-acetaminophen, ondansetron **OR** ondansetron (ZOFRAN) IV, mouth rinse   Anti-infectives (From admission, onward)    None         Subjective: Alan [name] today has no fevers, no emesis,  No chest pain,    -No obvious Bleeding concerns at this time -Fatigue and generalized weakness persist -Wife at bedside,  questions answered   Objective: Vitals:   03/03/23 0451 03/03/23 0738 03/03/23 0845 03/03/23 0939  BP:   (!) 140/46 128/63  Pulse:  69 74 70  Resp:  (!) 22 (!) 21 18  Temp: 98.5 F (36.9 C) 99 F (37.2 C)  97.9 F (36.6 C)  TempSrc: Oral Oral  Oral  SpO2:  99%  100%  Weight: 60.1 kg   61.7 kg  Height:    5\' 5"  (1.651 m)    Intake/Output Summary (Last 24 hours) at 03/03/2023 1458 Last data filed at 03/03/2023 0451 Gross per 24 hour  Intake 434 ml  Output 1625 ml  Net -1191 ml   Filed Weights   03/02/23 0516 03/03/23 0451 03/03/23 0939  Weight: 61 kg 60.1 kg 61.7 kg    Physical Exam  Gen:- Awake Alert,  in no apparent distress  HEENT:- [place].AT, No sclera icterus Neck-Supple Neck,No JVD,.  Lungs-  CTAB , fair symmetrical air movement CV- S1, S2 normal, irregular  Abd-  +ve B.Sounds, Abd Soft, No tenderness,    Extremity/Skin:- No  edema, pedal pulses present  Psych-affect is appropriate, oriented x3 Neuro-generalized weakness no new focal deficits, no tremors  Data Reviewed: I have personally reviewed following labs and imaging studies  CBC: Recent Labs  Lab 03/02/23 0024 03/03/23 0420  WBC 4.5 3.9*  NEUTROABS 3.4  --   HGB 6.0* 8.0*  HCT 18.8* 24.1*  MCV 99.5 92.7  PLT 130* 123XX123*   Basic Metabolic Panel: Recent Labs  Lab 03/02/23 0021 03/02/23 0153 03/03/23 0420  NA  --  134* 134*  K  --  4.0 3.3*  CL  --  100 102  CO2  --  21* 22  GLUCOSE  --  151* 201*  BUN  --  75* 51*  CREATININE  --  1.34* 1.03  CALCIUM  --  8.0* 8.3*  MG 2.2  --   --   PHOS 3.6  --   --    GFR: Estimated Creatinine Clearance: 49.8 mL/min (by C-G formula based on SCr of 1.03 mg/dL). Liver Function Tests: Recent Labs  Lab 03/03/23 0420  AST 60*  ALT 18  ALKPHOS 211*  BILITOT 0.9  PROT 6.0*  ALBUMIN 3.1*   Recent Results (from the past 240 hour(s))  MRSA Next Gen by PCR, Nasal     Status: None   Collection Time: 03/02/23  3:40 AM   Specimen: Nasal Mucosa; Nasal  Swab  Result Value Ref Range Status   MRSA by PCR Next Gen NOT DETECTED NOT DETECTED Final    Comment: (NOTE) The GeneXpert MRSA Assay (FDA approved for NASAL specimens only), is one component of a comprehensive MRSA colonization surveillance program. It is not intended to diagnose MRSA infection nor to guide or monitor treatment for  MRSA infections. Test performance is not FDA approved in patients less than 31 years old. Performed at [facility], [address], [place], [place] [address]      Scheduled Meds:  sodium chloride   Intravenous Once   sodium chloride   Intravenous Once   calcium carbonate  1,250 mg Oral BID WC   Chlorhexidine Gluconate Cloth  6 each Topical Q0600   docusate sodium  100 mg Oral BID   enoxaparin (LOVENOX) injection  40 mg Subcutaneous Daily   ferrous sulfate  325 mg Oral QODAY   lactulose  20 g Oral Daily   metoprolol succinate  100 mg Oral Daily   pantoprazole  40 mg Oral BID   potassium chloride  40 mEq Oral Q3H   pravastatin  40 mg Oral QPM   predniSONE  5 mg Oral q morning   warfarin  12.5 mg Oral ONCE-1600   Warfarin - Pharmacist Dosing Inpatient   Does not apply q1600   Continuous Infusions:   LOS: 0 days    Roxan Hockey M.D on 03/03/2023 at 2:58 PM  Go to [email or web address] - for contact info  [facility] - Office  [phone]  If 7PM-7AM, please contact night-coverage [email or web address] 03/03/2023, 2:58 PM

## 2023-03-03 NOTE — Progress Notes (Addendum)
ANTICOAGULATION CONSULT NOTE - Follow Up Consult  Pharmacy Consult for Warfarin Indication: DVT, splenic thrombus, hypercoagulable state  Allergies  Allergen Reactions   Allopurinol Other (See Comments)    Significantly decreased WBC's   Colchicine Other (See Comments)    Significantly decreased WBC's    Patient Measurements: Height: 5\' 5"  (165.1 cm) Weight: 61.7 kg (136 lb) IBW/kg (Calculated) : 61.5  Vital Signs: Temp: 97.9 F (36.6 C) (03/23 0939) Temp Source: Oral (03/23 0939) BP: 128/63 (03/23 0939) Pulse Rate: 70 (03/23 0939)  Labs: Recent Labs    03/02/23 0024 03/02/23 0153 03/02/23 1455 03/03/23 0420  HGB 6.0*  --   --  8.0*  HCT 18.8*  --   --  24.1*  PLT 130*  --   --  108*  LABPROT 69.7*  --  74.8* 18.3*  INR 8.5*  --  9.3* 1.5*  CREATININE  --  1.34*  --  1.03    Estimated Creatinine Clearance: 49.8 mL/min (by C-G formula based on SCr of 1.03 mg/dL).  Assessment: 82 year old male with hx splenic thrombus, Afib, hypercoagulable state due to cancer admitted on 3/21 with supratherapeutic INR. Now reversed s/p FFP and Vitamin K 5mg  IV. Discussed with Dr. Denton Brick, will initiate low dose lovenox until INR is therapeutic. Per patient has been on warfarin since 2016 and his dose has fluctuated between 7.5mg  and 10mg  dose. Given recent vit K, will likely need a larger dose to overcome temporary resistance.   Goal of Therapy:  INR 2-3 Monitor platelets by anticoagulation protocol: Yes   Plan:  Warfarin 12.5 po x 1 Lovenox 40mg  sq daily  Lorenso Courier, PharmD Clinical Pharmacist 03/03/2023 2:00 PM

## 2023-03-03 NOTE — Progress Notes (Signed)
Pt arrived to room 324 via WC from ICU. Pt ambulatory with assist x1 from chair to bed. Pt denies c/o, A&O. Oriented to room and safety procedures, states understanding

## 2023-03-04 DIAGNOSIS — D649 Anemia, unspecified: Secondary | ICD-10-CM | POA: Diagnosis not present

## 2023-03-04 LAB — CBC
HCT: 28.4 % — ABNORMAL LOW (ref 39.0–52.0)
Hemoglobin: 9.4 g/dL — ABNORMAL LOW (ref 13.0–17.0)
MCH: 31.1 pg (ref 26.0–34.0)
MCHC: 33.1 g/dL (ref 30.0–36.0)
MCV: 94 fL (ref 80.0–100.0)
Platelets: 124 10*3/uL — ABNORMAL LOW (ref 150–400)
RBC: 3.02 MIL/uL — ABNORMAL LOW (ref 4.22–5.81)
RDW: 18.6 % — ABNORMAL HIGH (ref 11.5–15.5)
WBC: 3.9 10*3/uL — ABNORMAL LOW (ref 4.0–10.5)
nRBC: 1.5 % — ABNORMAL HIGH (ref 0.0–0.2)

## 2023-03-04 LAB — PROTIME-INR
INR: 1.4 — ABNORMAL HIGH (ref 0.8–1.2)
Prothrombin Time: 16.5 seconds — ABNORMAL HIGH (ref 11.4–15.2)

## 2023-03-04 MED ORDER — APIXABAN 5 MG PO TABS: 5.0000 mg | ORAL_TABLET | Freq: Two times a day (BID) | ORAL | 3 refills | Status: DC

## 2023-03-04 MED ORDER — ACETAMINOPHEN 325 MG PO TABS: 650.0000 mg | ORAL_TABLET | Freq: Four times a day (QID) | ORAL | 0 refills | Status: DC | PRN

## 2023-03-04 MED ORDER — FERROUS SULFATE 325 (65 FE) MG PO TABS: 325.0000 mg | ORAL_TABLET | Freq: Every day | ORAL | 3 refills | Status: DC

## 2023-03-04 MED ORDER — WARFARIN SODIUM 7.5 MG PO TABS: 12.5000 mg | ORAL_TABLET | Freq: Once | ORAL | Status: DC

## 2023-03-04 MED ORDER — ENOXAPARIN SODIUM 30 MG/0.3ML IJ SOSY
30.0000 mg | PREFILLED_SYRINGE | Freq: Once | INTRAMUSCULAR | Status: AC
  Administered 2023-03-04: 30 mg via SUBCUTANEOUS
  Filled 2023-03-04: qty 0.3

## 2023-03-04 NOTE — Discharge Summary (Signed)
Alan Wobig., is a 82 y.o. male  DOB December 09, 1941  MRN [id number].  Admission date:  03/01/2023  Admitting Physician  Bernadette Hoit, DO  Discharge Date:  03/04/2023   Primary MD  Dettinger, Fransisca Kaufmann, MD  Recommendations for primary care physician for things to follow:   1)Avoid ibuprofen/Advil/Aleve/Motrin/Goody Powders/Naproxen/BC powders/Meloxicam/Diclofenac/Indomethacin and other Nonsteroidal anti-inflammatory medications as these will make you more likely to bleed and can cause stomach ulcers, can also cause Kidney problems.   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, C79.51]    Principal Problem:   Symptomatic anemia Active Problems:   Mixed hyperlipidemia   Prostate cancer ([place])   GERD (gastroesophageal reflux disease)   Essential hypertension   Non-insulin dependent type 2 diabetes mellitus ([place])   Malignant neoplasm metastatic to bone (H[facility])   Supratherapeutic INR   Iron deficiency anemia   Atrial fibrillation, chronic ([facility])   Bone pain   Constipation   Right inguinal hernia      Past Medical History:  Diagnosis Date   AKI (acute kidney injury) ([place]) 09/22/2019   Anticoagulated on Coumadin    managed by pcp   Arthritis    BPH with urinary obstruction    Diverticulosis of colon    ED (erectile dysfunction)    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   found during hospital stay with pancreatitis,  started coumadin;   last Abd CT in epic 07-29-2020  chronic thrombus portal venous   GERD (gastroesophageal  reflux disease)    Heart murmur    History of acute pancreatitis    2016   necrotizing pancreatitis   History of adenomatous polyp of colon    History of DVT of lower extremity    History of GI bleed 08/2019   upper gi bleed due to duodenal ulcer   History of gout yrs ago   Hypertension    followed by pcp   Hypertrophic cardiomyopathy ([place])    followed by cardiology   Internal hemorrhoids    Mixed hyperlipidemia    Normocytic anemia    PAF (paroxysmal atrial fibrillation) ([place]) 04/2016   cardiologist-- dr h. Tamala Julian--  event monitor 07-04-2017 epic, NSR/ PAflutter with occasional RVR/  PACs/ PVCs ;  echo 05-16-2017 epic,  moderate LVH with severe asymptomic septal hypertrophy, G1DD, EF 60-65%,  mild AR, mild LAE   Pancreatic pseudocyst    Peyronie's disease    Prostate cancer [facility]) urologist-- dr wrenn/ oncologist-- dr Tammi Klippel   first dx 08/ 2009 Gleason 3+3 active survelliance;  until bx 07/ 2021  Stage T2b, Gleason 4+3   Right inguinal hernia    Type 2 diabetes mellitus ([place])    followed by pcp---  (11-03-2020 per  pt currently not checking blood sugar due to glucose monitor broken)   Wears glasses    Wears partial dentures    upper and lower    Past Surgical History:  Procedure Laterality Date   BIOPSY  02/09/2022   Procedure: BIOPSY;  Surgeon: Milus Banister, MD;  Location: [facility];  Service: Endoscopy;;   CHOLECYSTECTOMY N/A 11/26/2015   Procedure: LAPAROSCOPIC CHOLECYSTECTOMY;  Surgeon: Aviva Signs, MD;  Location: [facility];  Service: General;  Laterality: N/A;   COLONOSCOPY  last one 09-06-2020  dr stark   CYSTOSCOPY N/A 11/09/2020   Procedure: Erlene Quan;  Surgeon: Irine Seal, MD;  Location: [facility];  Service: Urology;  Laterality: N/A;   ESOPHAGOGASTRODUODENOSCOPY  10-06-2019  @[facility]    ESOPHAGOGASTRODUODENOSCOPY N/A 02/09/2022   Procedure: ESOPHAGOGASTRODUODENOSCOPY (EGD);  Surgeon: Milus Banister, MD;  Location: [facility];  Service:  Endoscopy;  Laterality: N/A;   EUS N/A 02/09/2022   Procedure: UPPER ENDOSCOPIC ULTRASOUND (EUS) RADIAL;  Surgeon: Milus Banister, MD;  Location: [facility];  Service: Endoscopy;  Laterality: N/A;   FLEXIBLE BRONCHOSCOPY Bilateral 04/14/2016   Procedure: FLEXIBLE BRONCHOSCOPY;  Surgeon: Sinda Du, MD;  [facility];  Service: Cardiopulmonary;  Laterality: Bilateral;   HEMORRHOID SURGERY  yrs ago   RADIOACTIVE SEED IMPLANT N/A 11/09/2020   Procedure: RADIOACTIVE SEED IMPLANT/BRACHYTHERAPY IMPLANT;  Surgeon: Irine Seal, MD;  Location: [facility];  Service: Urology;  Laterality: N/A;   SPACE OAR INSTILLATION N/A 11/09/2020   Procedure: SPACE OAR INSTILLATION;  Surgeon: Irine Seal, MD;  Location: [facility];  Service: Urology;  Laterality: N/A;   UMBILICAL HERNIA REPAIR N/A 11/26/2015   Procedure: UMBILICAL HERNIORRHAPHY;  Surgeon: Aviva Signs, MD;  Location: [facility];  Service: General;  Laterality: N/A;       HPI  from the history and physical done on the day of admission:   HPI: Alan Gielow. is a 82 y.o. 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  Patient was recently taking off chemotherapy due to disease progression.  He complained of back pain not responding to home hydrocodone, he was still able to obtain partial relief from the medication.  Patient also complaining of spontaneous nosebleed yesterday, but this has since resolved.  He complained of chronic constipation which was related to opioid use, but denies bowel or bladder problems.   ED Course:  In the emergency department, patient was hemodynamically stable, BP was 112/56 and other vital signs are within normal range.  Workup in the ED shows CBC with WBC of 4.5, hemoglobin  6.0, hematocrit 18.8, MCV 99.5, platelets 130, INR 8.5, BMP showed sodium of 134, potassium 4.0, chloride 100, bicarb 21, blood glucose 151, BUN 75, creatinine 1.34 (baseline creatinine at 1.1-1.2). Patient was treated with 2 tablets of Norco 5-3 25, vitamin K was given, 2 units of PRBC was ordered due to hemoglobin less than 7.  Hospitalist was asked to admit patient for further evaluation and management.   Review of Systems: Review of systems as noted in the HPI. All other systems reviewed and are negative.     Hospital Course:     Brief Narrative:  82 y.o. 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, hypertension, T2DM admitted on 03/02/2023 with hemoglobin of 6.0 and INR at 8.5 with concerns for possible GI bleed       -Assessment and Plan: A/p 1)Supratherapeutic INR with ABLA INR on admission was 8.4 -INR peaked at 9.3 on 03/02/2023 and was trending up despite vitamin K -Patient had nosebleed and dropping hemoglobin required  2 units of PRBC -Patient received additional vitamin K and FFP on 03/01/2022 -Patient on chronic anticoagulation with warfarin PTA due to history of chronic A-fib and unprovoked right lower extremity DVT/hx splenic thrombus previously 03/04/23- INR is down to 1.4 -Anticoagulation as below #5   2)Acute on chronic symptomatic anemia--patient had recurrent nosebleeds prior to admission -need to rule out GI bleeding -03/04/23 -Hgb is up to 9.4 from 6.0 after transfusion of 2 units of PRBC on 03/02/2023 -Continue Protonix   3)Prostate cancer with metastasis to bone Bone pain is Not worse Continue Norco as needed   4)Chronic A-fib--- continue Toprol-XL for rate control,  -Coumadin as below #5   5) chronic anticoagulation due to chronic atrial fibrillation, history of unprovoked right leg DVT and hx splenic  thrombus as well as malignancy induced hypercoagulable state-- 03/03/23 -INR is down to 1.4 from 9.3 after vitamin K and FFP -Patient and family request transition from Coumadin to Eliquis due to erratic-please see #6 below   6)Social/Ethics---Discussed with pt, his wife, son [name]) and Daughter [name] by phone,  -Patient remains a full code at this time -Family conference  on 03/04/23---in person at Bedside with wife in person, son [name]) and Daughter [name] by phone-- to discuss goals of care and advanced directives -Decisions were made including :- -Patient and family request full CODE STATUS -Outpatient follow-up with hospice in the setting of progressive metastatic prostate cancer -Stopping Coumadin and transitioning to Eliquis due to erratic INRs    Disposition: The patient is from: Home              Anticipated d/c is to: Home   Discharge Condition: stable  Follow UP   Follow-up Information     Irine Seal, MD. Schedule an appointment as soon as possible for a visit.   Specialty: Urology Contact information: [address] [place] [address] [phone]                 Diet and Activity recommendation:  As advised  Discharge Instructions   Discharge Instructions     Call MD for:  difficulty breathing, headache or visual disturbances   Complete by: As directed    Call MD for:  persistant dizziness or light-headedness   Complete by: As directed    Call MD for:  persistant nausea and vomiting   Complete by: As directed    Call MD for:  temperature >100.4   Complete by: As directed    Diet general   Complete by: As directed    Discharge instructions   Complete by: As directed    1)Avoid ibuprofen/Advil/Aleve/Motrin/Goody Powders/Naproxen/BC powders/Meloxicam/Diclofenac/Indomethacin and other Nonsteroidal anti-inflammatory medications as these will make you more likely to bleed and can cause stomach ulcers, can also cause Kidney problems.    2)follow up with Hospice Team as discussed  3) please note that there are several changes to your medications   Increase activity slowly   Complete by: As directed          Discharge Medications     Allergies as of 03/04/2023  Reactions   Allopurinol Other (See Comments)   Significantly decreased WBC's   Colchicine Other (See Comments)   Significantly decreased WBC's        Medication List     STOP taking these medications    abiraterone acetate 250 MG tablet Commonly known as: ZYTIGA   chlorthalidone 25 MG tablet Commonly known as: HYGROTON   docusate sodium 100 MG capsule Commonly known as: COLACE   HYDROcodone-acetaminophen 5-325 MG tablet Commonly known as: NORCO/VICODIN Replaced by: HYDROcodone-acetaminophen 7.5-325 MG tablet   lactulose 10 GM/15ML solution Commonly known as: CHRONULAC   lisinopril 40 MG tablet Commonly known as: ZESTRIL   potassium chloride SA 20 MEQ tablet Commonly known as: KLOR-CON M   pravastatin 40 MG tablet Commonly known as: PRAVACHOL   predniSONE 5 MG tablet Commonly known as: DELTASONE   warfarin 10 MG tablet Commonly known as: Coumadin       TAKE these medications    acetaminophen 325 MG tablet Commonly known as: TYLENOL Take 2 tablets (650 mg total) by mouth every 6 (six) hours as needed for mild pain, fever or headache (or Fever >/= 101). What changed:  medication strength how much to take reasons to take this   amLODipine 10 MG tablet Commonly known as: NORVASC Take 1 tablet (10 mg total) by mouth every evening.   apixaban 5 MG Tabs tablet Commonly known as: ELIQUIS Take 1 tablet (5 mg total) by mouth 2 (two) times daily. Blood Thinner   Blood Pressure Kit 1 each by Does not apply route daily.   calcium carbonate 1500 (600 Ca) MG Tabs tablet Commonly known as: OSCAL Take by mouth 2 (two) times daily with a meal.   diclofenac Sodium 1 % Gel Commonly known as: VOLTAREN Apply 1  application topically 2 (two) times daily as needed (pain).   ferrous sulfate 325 (65 FE) MG tablet Commonly known as: SV Iron Take 1 tablet (325 mg total) by mouth daily with breakfast. What changed: medication strength   fluticasone 50 MCG/ACT nasal spray Commonly known as: FLONASE Place 1 spray into both nostrils 2 (two) times daily as needed for allergies or rhinitis.   glucose blood test strip Commonly known as: ONE TOUCH ULTRA TEST Use to check BG once daily.  Dx:  Type 2 DM controlled E11.9   HYDROcodone-acetaminophen 7.5-325 MG tablet Commonly known as: Norco Take 1 tablet by mouth every 6 (six) hours as needed for moderate pain. Replaces: HYDROcodone-acetaminophen 5-325 MG tablet   magnesium oxide 400 (240 Mg) MG tablet Commonly known as: MAG-OX Take 1 tablet (400 mg total) by mouth in the morning, at noon, and at bedtime.   metFORMIN 1000 MG tablet Commonly known as: GLUCOPHAGE Take 1 tablet (1,000 mg total) by mouth 2 (two) times daily with a meal.   metoprolol succinate 100 MG 24 hr tablet Commonly known as: TOPROL-XL Take 1 tablet (100 mg total) by mouth daily. Take with or immediately following a meal.   omeprazole 20 MG capsule Commonly known as: PRILOSEC Take 1 capsule (20 mg total) by mouth daily.   ONE TOUCH ULTRA MINI w/Device Kit 1 each by Does not apply route 2 (two) times daily.       Major procedures and Radiology Reports - PLEASE review detailed and final reports for all details, in brief -   Recent Results (from the past 240 hour(s))  MRSA Next Gen by PCR, Nasal     Status: None   Collection Time: 03/02/23  3:40  AM   Specimen: Nasal Mucosa; Nasal Swab  Result Value Ref Range Status   MRSA by PCR Next Gen NOT DETECTED NOT DETECTED Final    Comment: (NOTE) The GeneXpert MRSA Assay (FDA approved for NASAL specimens only), is one component of a comprehensive MRSA colonization surveillance program. It is not intended to diagnose MRSA infection  nor to guide or monitor treatment for MRSA infections. Test performance is not FDA approved in patients less than 98 years old. Performed at [facility], [address] [place], [place] [address]     Today   Subjective    Alan [name] today has no new complaints  No fever  Or chills  - Wife, granddaughter and 2 sons at bedside--- questions answered  No Nausea, Vomiting or Diarrhea       Patient has been seen and examined prior to discharge   Objective   Blood pressure (!) 125/57, pulse 66, temperature (!) 97.5 F (36.4 C), temperature source Oral, resp. rate 18, height 5\' 5"  (1.651 m), weight 61.7 kg, SpO2 100 %.   Intake/Output Summary (Last 24 hours) at 03/04/2023 1730 Last data filed at 03/04/2023 1300 Gross per 24 hour  Intake 480 ml  Output --  Net 480 ml    Exam Gen:- Awake Alert,  in no apparent distress  HEENT:- .AT, No sclera icterus Neck-Supple Neck,No JVD,.  Lungs-  CTAB , fair symmetrical air movement CV- S1, S2 normal, irregular  Abd-  +ve B.Sounds, Abd Soft, No tenderness,    Extremity/Skin:- No  edema, pedal pulses present  Psych-affect is appropriate, oriented x3 Neuro-generalized weakness no new focal deficits, no tremors    Data Review   CBC w Diff:  Lab Results  Component Value Date   WBC 3.9 (L) 03/04/2023   HGB 9.4 (L) 03/04/2023   HGB 7.8 (LL) 10/16/2022   HCT 28.4 (L) 03/04/2023   HCT 25.3 (L) 10/16/2022   PLT 124 (L) 03/04/2023   PLT 332 10/16/2022   LYMPHOPCT 14 03/02/2023   MONOPCT 7 03/02/2023   EOSPCT 0 03/02/2023   BASOPCT 0 03/02/2023    CMP:  Lab Results  Component Value Date   NA 134 (L) 03/03/2023   NA 140 03/27/2022   K 3.3 (L) 03/03/2023   CL 102 03/03/2023   CO2 22 03/03/2023   BUN 51 (H) 03/03/2023   BUN 23 03/27/2022   CREATININE 1.03 03/03/2023   CREATININE 1.06 04/29/2013   PROT 6.0 (L) 03/03/2023   PROT 6.8 03/27/2022   ALBUMIN 3.1 (L) 03/03/2023   ALBUMIN 3.9 03/27/2022   BILITOT 0.9 03/03/2023    BILITOT 0.3 03/27/2022   ALKPHOS 211 (H) 03/03/2023   AST 60 (H) 03/03/2023   ALT 18 03/03/2023  .  Total Discharge time is about 33 minutes  Roxan Hockey M.D on 03/04/2023 at 5:30 PM  Go to [email or web address] -  for contact info  [facility] - Office  [phone]

## 2023-03-04 NOTE — Progress Notes (Addendum)
ANTICOAGULATION CONSULT NOTE - Follow Up Consult  Pharmacy Consult for Warfarin Indication: DVT, splenic thrombus, hypercoagulable state  Allergies  Allergen Reactions   Allopurinol Other (See Comments)    Significantly decreased WBC's   Colchicine Other (See Comments)    Significantly decreased WBC's    Patient Measurements: Height: 5\' 5"  (165.1 cm) Weight: 61.7 kg (136 lb) IBW/kg (Calculated) : 61.5  Vital Signs: Temp: 98.2 F (36.8 C) (03/24 0550) BP: 134/60 (03/24 0821) Pulse Rate: 67 (03/24 0821)  Labs: Recent Labs    03/02/23 0024 03/02/23 0153 03/02/23 1455 03/03/23 0420 03/04/23 0505  HGB 6.0*  --   --  8.0* 9.4*  HCT 18.8*  --   --  24.1* 28.4*  PLT 130*  --   --  108* 124*  LABPROT 69.7*  --  74.8* 18.3* 16.5*  INR 8.5*  --  9.3* 1.5* 1.4*  CREATININE  --  1.34*  --  1.03  --      Estimated Creatinine Clearance: 49.8 mL/min (by C-G formula based on SCr of 1.03 mg/dL).  Assessment: 82 year old male with hx splenic thrombus, Afib, hypercoagulable state due to cancer admitted on 3/21 with supratherapeutic INR. Now reversed s/p FFP and Vitamin K 5mg  IV. Discussed with Dr. Denton Brick, will initiate low dose lovenox until INR is therapeutic. Per patient has been on warfarin since 2016 and his dose has fluctuated between 7.5mg  and 10mg  dose. Given recent vit K, will likely need a larger dose to overcome temporary resistance.   PTA: 10mg  daily x 5mg  Tuesday.   INR 1.4  Goal of Therapy:  INR 2-3 Monitor platelets by anticoagulation protocol: Yes   Plan:  Repeat warfarin 12.5 po x 1  Lorenso Courier, PharmD Clinical Pharmacist 03/04/2023 10:43 AM

## 2023-03-04 NOTE — Progress Notes (Signed)
Patient discharged home today, transported home by family. Discharge paperwork went over with patient, patient verbalized understanding. Belongings sent home with patient.  ?

## 2023-03-08 ENCOUNTER — Ambulatory Visit: Payer: No Typology Code available for payment source

## 2023-03-09 DIAGNOSIS — Z008 Encounter for other general examination: Secondary | ICD-10-CM | POA: Diagnosis not present

## 2023-03-09 DIAGNOSIS — K922 Gastrointestinal hemorrhage, unspecified: Secondary | ICD-10-CM | POA: Diagnosis not present

## 2023-03-14 ENCOUNTER — Ambulatory Visit: Payer: No Typology Code available for payment source | Admitting: Family Medicine

## 2023-03-15 ENCOUNTER — Ambulatory Visit: Payer: No Typology Code available for payment source

## 2023-03-16 ENCOUNTER — Encounter: Payer: Self-pay | Admitting: Family Medicine

## 2023-03-19 ENCOUNTER — Encounter: Payer: Self-pay | Admitting: Family Medicine

## 2023-03-19 ENCOUNTER — Ambulatory Visit (INDEPENDENT_AMBULATORY_CARE_PROVIDER_SITE_OTHER): Payer: No Typology Code available for payment source | Admitting: Family Medicine

## 2023-03-19 VITALS — BP 143/50 | HR 86 | Ht 65.0 in | Wt 138.0 lb

## 2023-03-19 DIAGNOSIS — D649 Anemia, unspecified: Secondary | ICD-10-CM

## 2023-03-19 DIAGNOSIS — C7951 Secondary malignant neoplasm of bone: Secondary | ICD-10-CM | POA: Diagnosis not present

## 2023-03-19 DIAGNOSIS — Z515 Encounter for palliative care: Secondary | ICD-10-CM | POA: Diagnosis not present

## 2023-03-19 DIAGNOSIS — R040 Epistaxis: Secondary | ICD-10-CM | POA: Diagnosis not present

## 2023-03-19 DIAGNOSIS — I480 Paroxysmal atrial fibrillation: Secondary | ICD-10-CM

## 2023-03-19 MED ORDER — TRAZODONE HCL 50 MG PO TABS: 25.0000 mg | ORAL_TABLET | Freq: Every evening | ORAL | 3 refills | Status: DC | PRN

## 2023-03-19 MED ORDER — HYDROCODONE-ACETAMINOPHEN 7.5-325 MG PO TABS: 1.0000 | ORAL_TABLET | Freq: Two times a day (BID) | ORAL | 0 refills | Status: DC | PRN

## 2023-03-19 NOTE — Progress Notes (Signed)
BP (!) 143/50   Pulse 86   Ht 5\' 5"  (1.651 m)   Wt 138 lb (62.6 kg)   SpO2 100%   BMI 22.96 kg/m    Subjective:   Patient ID: Alan Heading., male    DOB: 12/10/1941, 82 y.o.   MRN: [id number]  HPI: Alan Benoit. is a 82 y.o. male presenting on 03/19/2023 for Hospitalization Follow-up (Recent Iron infusion and blood transfusion. Continues to have nose bleeds.)   HPI Patient is coming in for hospital follow-up for nosebleeds and symptomatic anemia.  Had blood transfusion and iron transfusions.  His hemoglobin went down as low as 6.  He was on Coumadin before they sent him home on Eliquis.  He says he still having the bleeding episodes and thinks it is happening every day.  He is able to stop the nosebleeding but has still been fighting it recurrently.  He does have a history of DVTs and A-fib's but he also has metastatic prostate cancer to the bone and planning on going on hospice care.  He denies any significant lightheadedness or dizziness today.  Relevant past medical, surgical, family and social history reviewed and updated as indicated. Interim medical history since our last visit reviewed. Allergies and medications reviewed and updated.  Review of Systems  Constitutional:  Negative for chills and fever.  HENT:  Positive for nosebleeds. Negative for postnasal drip, rhinorrhea and sinus pain.   Eyes:  Negative for visual disturbance.  Respiratory:  Negative for shortness of breath and wheezing.   Cardiovascular:  Negative for chest pain and leg swelling.  Musculoskeletal:  Negative for back pain and gait problem.  Skin:  Negative for rash.  All other systems reviewed and are negative.   Per HPI unless specifically indicated above   Allergies as of 03/19/2023       Reactions   Allopurinol Other (See Comments)   Significantly decreased WBC's   Colchicine Other (See Comments)   Significantly decreased WBC's        Medication List        Accurate as of March 19, 2023   2:49 PM. If you have any questions, ask your nurse or doctor.          STOP taking these medications    amLODipine 10 MG tablet Commonly known as: NORVASC Stopped by: Alan Radon Quantavia Frith, MD   apixaban 5 MG Tabs tablet Commonly known as: ELIQUIS Stopped by: Nils Pyle, MD       TAKE these medications    acetaminophen 325 MG tablet Commonly known as: TYLENOL Take 2 tablets (650 mg total) by mouth every 6 (six) hours as needed for mild pain, fever or headache (or Fever >/= 101).   Blood Pressure Kit 1 each by Does not apply route daily.   calcium carbonate 1500 (600 Ca) MG Tabs tablet Commonly known as: OSCAL Take by mouth 2 (two) times daily with a meal.   diclofenac Sodium 1 % Gel Commonly known as: VOLTAREN Apply 1 application topically 2 (two) times daily as needed (pain).   ferrous sulfate 325 (65 FE) MG tablet Commonly known as: SV Iron Take 1 tablet (325 mg total) by mouth daily with breakfast.   fluticasone 50 MCG/ACT nasal spray Commonly known as: FLONASE Place 1 spray into both nostrils 2 (two) times daily as needed for allergies or rhinitis.   glucose blood test strip Commonly known as: ONE TOUCH ULTRA TEST Use to check BG once daily.  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 What changed: when to take this Changed by: Nils Pyle, MD   magnesium oxide 400 (240 Mg) MG tablet Commonly known as: MAG-OX Take 1 tablet (400 mg total) by mouth in the morning, at noon, and at bedtime.   metFORMIN 1000 MG tablet Commonly known as: GLUCOPHAGE Take 1 tablet (1,000 mg total) by mouth 2 (two) times daily with a meal.   metoprolol succinate 100 MG 24 hr tablet Commonly known as: TOPROL-XL Take 1 tablet (100 mg total) by mouth daily. Take with or immediately following a meal.   omeprazole 20 MG  capsule Commonly known as: PRILOSEC Take 1 capsule (20 mg total) by mouth daily.   ONE TOUCH ULTRA MINI w/Device Kit 1 each by Does not apply route 2 (two) times daily.   traZODone 50 MG tablet Commonly known as: DESYREL Take 0.5-1 tablets (25-50 mg total) by mouth at bedtime as needed for sleep. Started by: Alan Radon Katana Berthold, MD         Objective:   BP (!) 143/50   Pulse 86   Ht  (1.651 m)   Wt 138 lb (62.6 kg)   SpO2 100%   BMI 22.96 kg/m   Wt Readings from Last 3 Encounters:  03/19/23 138 lb (62.6 kg)  03/03/23 136 lb (61.7 kg)  02/14/23 144 lb (65.3 kg)    Physical Exam Vitals and nursing note reviewed.  Constitutional:      General: He is not in acute distress.    Appearance: He is well-developed. He is not diaphoretic.  Eyes:     General: No scleral icterus.    Conjunctiva/sclera: Conjunctivae normal.  Neck:     Thyroid: No thyromegaly.  Cardiovascular:     Rate and Rhythm: Normal rate and regular rhythm.     Heart sounds: Normal heart sounds. No murmur heard. Pulmonary:     Effort: Pulmonary effort is normal. No respiratory distress.     Breath sounds: Normal breath sounds. No wheezing.  Musculoskeletal:     Cervical back: Neck supple.  Lymphadenopathy:     Cervical: No cervical adenopathy.  Neurological:     Mental Status: He is alert and oriented to person, place, and time.     Coordination: Coordination normal.  Psychiatric:        Behavior: Behavior normal.       Assessment & Plan:   Problem List Items Addressed This Visit       Cardiovascular and Mediastinum   Paroxysmal atrial fibrillation - Primary     Musculoskeletal and Integument   Malignant neoplasm metastatic to bone   Relevant Medications   HYDROcodone-acetaminophen (NORCO) 7.5-325 MG tablet   traZODone (DESYREL) 50 MG tablet     Other   Symptomatic anemia   Relevant Orders   CBC with Differential/Platelet   Other Visit Diagnoses     Frequent nosebleeds        Relevant Orders   CBC with Differential/Platelet   Encounter for admission to hospice care       Relevant Medications   HYDROcodone-acetaminophen (NORCO) 7.5-325 MG tablet   traZODone (DESYREL) 50 MG tablet       Patient is going on to hospice care because of metastatic prostate cancer, will go ahead and stop the Eliquis for clot prevention because he is going on hospice care  and end-stage care.  Will recheck his blood counts today to make sure he is not significantly symptomatic but with him still having nosebleeds, recommend that he does not go back on his blood thinner. Follow up plan: Return if symptoms worsen or fail to improve.  Counseling provided for all of the vaccine components Orders Placed This Encounter  Procedures   CBC with Differential/Platelet    Arville CareJoshua Jazzelle Zhang, MD [facility] 03/19/2023, 2:49 PM

## 2023-03-20 ENCOUNTER — Other Ambulatory Visit: Payer: Self-pay | Admitting: Hematology

## 2023-03-20 LAB — CBC WITH DIFFERENTIAL/PLATELET
Basophils Absolute: 0.1 10*3/uL (ref 0.0–0.2)
Basos: 1 %
EOS (ABSOLUTE): 0 10*3/uL (ref 0.0–0.4)
Eos: 0 %
Hematocrit: 23.7 % — ABNORMAL LOW (ref 37.5–51.0)
Hemoglobin: 7.7 g/dL — CL (ref 13.0–17.7)
Lymphocytes Absolute: 0.6 10*3/uL — ABNORMAL LOW (ref 0.7–3.1)
Lymphs: 12 %
MCH: 29.6 pg (ref 26.6–33.0)
MCHC: 32.5 g/dL (ref 31.5–35.7)
MCV: 91 fL (ref 79–97)
Monocytes Absolute: 0.1 10*3/uL (ref 0.1–0.9)
Monocytes: 1 %
NRBC: 1 % — ABNORMAL HIGH (ref 0–0)
Neutrophils Absolute: 4.3 10*3/uL (ref 1.4–7.0)
Neutrophils: 81 %
Platelets: 151 10*3/uL (ref 150–450)
RBC: 2.6 x10E6/uL — CL (ref 4.14–5.80)
RDW: 16.3 % — ABNORMAL HIGH (ref 11.6–15.4)
WBC: 5.3 10*3/uL (ref 3.4–10.8)

## 2023-03-20 LAB — IMMATURE CELLS
MYELOCYTES: 3 % — ABNORMAL HIGH (ref 0–0)
Metamyelocytes: 2 % — ABNORMAL HIGH (ref 0–0)

## 2023-03-21 ENCOUNTER — Encounter (HOSPITAL_COMMUNITY): Payer: Self-pay

## 2023-03-21 ENCOUNTER — Other Ambulatory Visit: Payer: Self-pay

## 2023-03-21 ENCOUNTER — Other Ambulatory Visit: Payer: No Typology Code available for payment source

## 2023-03-21 ENCOUNTER — Emergency Department (HOSPITAL_COMMUNITY)
Admission: EM | Admit: 2023-03-21 | Discharge: 2023-03-21 | Disposition: A | Discharge status: Home / Self Care | Payer: No Typology Code available for payment source | Attending: Emergency Medicine | Admitting: Emergency Medicine

## 2023-03-21 DIAGNOSIS — D649 Anemia, unspecified: Secondary | ICD-10-CM

## 2023-03-21 DIAGNOSIS — Z8546 Personal history of malignant neoplasm of prostate: Secondary | ICD-10-CM | POA: Insufficient documentation

## 2023-03-21 DIAGNOSIS — R531 Weakness: Secondary | ICD-10-CM | POA: Diagnosis present

## 2023-03-21 DIAGNOSIS — I10 Essential (primary) hypertension: Secondary | ICD-10-CM | POA: Diagnosis not present

## 2023-03-21 DIAGNOSIS — Z7901 Long term (current) use of anticoagulants: Secondary | ICD-10-CM | POA: Diagnosis not present

## 2023-03-21 LAB — CBC WITH DIFFERENTIAL/PLATELET
Abs Immature Granulocytes: 0.1 10*3/uL — ABNORMAL HIGH (ref 0.00–0.07)
Band Neutrophils: 3 %
Basophils Absolute: 0 10*3/uL (ref 0.0–0.1)
Basophils Relative: 1 %
Eosinophils Absolute: 0 10*3/uL (ref 0.0–0.5)
Eosinophils Relative: 1 %
HCT: 21 % — ABNORMAL LOW (ref 39.0–52.0)
Hemoglobin: 6.8 g/dL — CL (ref 13.0–17.0)
Lymphocytes Relative: 18 %
Lymphs Abs: 0.8 10*3/uL (ref 0.7–4.0)
MCH: 30.6 pg (ref 26.0–34.0)
MCHC: 32.4 g/dL (ref 30.0–36.0)
MCV: 94.6 fL (ref 80.0–100.0)
Metamyelocytes Relative: 2 %
Monocytes Absolute: 0.2 10*3/uL (ref 0.1–1.0)
Monocytes Relative: 5 %
Neutro Abs: 3.3 10*3/uL (ref 1.7–7.7)
Neutrophils Relative %: 70 %
Platelets: 127 10*3/uL — ABNORMAL LOW (ref 150–400)
RBC: 2.22 MIL/uL — ABNORMAL LOW (ref 4.22–5.81)
RDW: 16.8 % — ABNORMAL HIGH (ref 11.5–15.5)
WBC: 4.5 10*3/uL (ref 4.0–10.5)
nRBC: 2.7 % — ABNORMAL HIGH (ref 0.0–0.2)
nRBC: 3 /100 WBC — ABNORMAL HIGH

## 2023-03-21 LAB — COMPREHENSIVE METABOLIC PANEL
ALT: 15 U/L (ref 0–44)
AST: 61 U/L — ABNORMAL HIGH (ref 15–41)
Albumin: 3.1 g/dL — ABNORMAL LOW (ref 3.5–5.0)
Alkaline Phosphatase: 522 U/L — ABNORMAL HIGH (ref 38–126)
Anion gap: 9 (ref 5–15)
BUN: 23 mg/dL (ref 8–23)
CO2: 21 mmol/L — ABNORMAL LOW (ref 22–32)
Calcium: 8.3 mg/dL — ABNORMAL LOW (ref 8.9–10.3)
Chloride: 103 mmol/L (ref 98–111)
Creatinine, Ser: 1.02 mg/dL (ref 0.61–1.24)
GFR, Estimated: >60 mL/min (ref >60)
Glucose, Bld: 172 mg/dL — ABNORMAL HIGH (ref 70–99)
Potassium: 3.8 mmol/L (ref 3.5–5.1)
Sodium: 133 mmol/L — ABNORMAL LOW (ref 135–145)
Total Bilirubin: 0.9 mg/dL (ref 0.3–1.2)
Total Protein: 6.3 g/dL — ABNORMAL LOW (ref 6.5–8.1)

## 2023-03-21 LAB — BPAM RBC
Blood Product Expiration Date: 202404182359
ISSUE DATE / TIME: 202404101519

## 2023-03-21 LAB — PREPARE RBC (CROSSMATCH)

## 2023-03-21 MED ORDER — SODIUM CHLORIDE 0.9% IV SOLUTION: Freq: Once | INTRAVENOUS | Status: AC

## 2023-03-21 NOTE — ED Triage Notes (Signed)
Patient went to PCP to have labs drawn and his Hemoglobin is dropping. It was 9.4 on 3/24 and is 7.7 today. Patient has increased weakness

## 2023-03-21 NOTE — Discharge Instructions (Signed)
Please be sure to speak with your physician about today's visit, blood transfusion, and to discuss goals of care and next steps for your therapy and/or palliative care consultation. Return here for concerning changes in your condition.

## 2023-03-21 NOTE — ED Triage Notes (Signed)
Pre family patient has had blood in his stools

## 2023-03-21 NOTE — ED Provider Notes (Signed)
[place] EMERGENCY DEPARTMENT AT [facility] Provider Note   CSN: [id number] Arrival date & time: 03/21/23  1157     History  Chief Complaint  Patient presents with   Weakness    Arma HeadingJohn Hawker Jr. is a 82 y.o. male.  HPI Patient with history of metastatic prostate cancer, GI bleed requiring transfusion now presents with his wife, daughter with concern for weakness, pallor. Notably patient recently stopped his Eliquis, had conversations 2 days ago with palliative care but has not yet fully enrolled in their services yet. He presents today with weakness, pallor, difficulty performing ADL secondary to weakness. No focal weakness, no confusion, disorientation.    Home Medications Prior to Admission medications   Medication Sig Start Date End Date Taking? Authorizing Provider  acetaminophen (TYLENOL) 325 MG tablet Take 2 tablets (650 mg total) by mouth every 6 (six) hours as needed for mild pain, fever or headache (or Fever >/= 101). 03/04/23   Emokpae, Courage, MD  Blood Glucose Monitoring Suppl (ONE TOUCH ULTRA MINI) w/Device KIT 1 each by Does not apply route 2 (two) times daily. 01/14/21   Dettinger, Elige RadonJoshua A, MD  Blood Pressure KIT 1 each by Does not apply route daily. 08/16/22   Dettinger, Elige RadonJoshua A, MD  calcium carbonate (OSCAL) 1500 (600 Ca) MG TABS tablet Take by mouth 2 (two) times daily with a meal.    [provider]  diclofenac Sodium (VOLTAREN) 1 % GEL Apply 1 application topically 2 (two) times daily as needed (pain).    [provider]  ferrous sulfate (SV IRON) 325 (65 FE) MG tablet Take 1 tablet (325 mg total) by mouth daily with breakfast. 03/04/23   Mariea ClontsEmokpae, Courage, MD  fluticasone (FLONASE) 50 MCG/ACT nasal spray Place 1 spray into both nostrils 2 (two) times daily as needed for allergies or rhinitis. 11/05/15   Dettinger, Elige RadonJoshua A, MD  glucose blood (ONE TOUCH ULTRA TEST) test strip Use to check BG once daily.  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 03/19/23   Dettinger, Elige RadonJoshua A, MD  magnesium oxide (MAG-OX) 400 (240 Mg) MG tablet Take 1 tablet (400 mg total) by mouth in the morning, at noon, and at bedtime. 11/29/22   Doreatha MassedKatragadda, Sreedhar, MD  metFORMIN (GLUCOPHAGE) 1000 MG tablet Take 1 tablet (1,000 mg total) by mouth 2 (two) times daily with a meal. 05/29/22   Dettinger, Elige RadonJoshua A, MD  metoprolol succinate (TOPROL-XL) 100 MG 24 hr tablet Take 1 tablet (100 mg total) by mouth daily. Take with or immediately following a meal. 01/12/23   Dettinger, Elige RadonJoshua A, MD  omeprazole (PRILOSEC) 20 MG capsule Take 1 capsule (20 mg total) by mouth daily. 01/12/23   Dettinger, Elige RadonJoshua A, MD  predniSONE (DELTASONE) 5 MG tablet Take 1 tablet by mouth once daily with breakfast 03/20/23   Doreatha MassedKatragadda, Sreedhar, MD  traZODone (DESYREL) 50 MG tablet Take 0.5-1 tablets (25-50 mg total) by mouth at bedtime as needed for sleep. 03/19/23   Dettinger, Elige RadonJoshua A, MD      Allergies    Allopurinol and Colchicine    Review of Systems   Review of Systems  All other systems reviewed and are negative.   Physical Exam Updated Vital Signs Pulse 74   Temp (!) 97.5 F (36.4 C) (Oral)   Resp 18   Ht 5'  5" (1.651 m)   Wt 62.6 kg   SpO2 99%   BMI 22.95 kg/m  Physical Exam Vitals and nursing note reviewed.  Constitutional:      General: He is not in acute distress.    Appearance: He is well-developed.  HENT:     Head: Normocephalic and atraumatic.  Eyes:     Conjunctiva/sclera: Conjunctivae normal.  Cardiovascular:     Rate and Rhythm: Normal rate and regular rhythm.  Pulmonary:     Effort: Pulmonary effort is normal. No respiratory distress.     Breath sounds: No stridor.  Abdominal:     General: There is no distension.  Skin:    General: Skin is warm and dry.     Coloration: Skin is pale.  Neurological:      Mental Status: He is alert and oriented to person, place, and time.     ED Results / Procedures / Treatments   Labs (all labs ordered are listed, but only abnormal results are displayed) Labs Reviewed  CBC WITH DIFFERENTIAL/PLATELET - Abnormal; Notable for the following components:      Result Value   RBC 2.22 (*)    Hemoglobin 6.8 (*)    HCT 21.0 (*)    RDW 16.8 (*)    Platelets 127 (*)    nRBC 2.7 (*)    nRBC 3 (*)    Abs Immature Granulocytes 0.10 (*)    All other components within normal limits  COMPREHENSIVE METABOLIC PANEL - Abnormal; Notable for the following components:   Sodium 133 (*)    CO2 21 (*)    Glucose, Bld 172 (*)    Calcium 8.3 (*)    Total Protein 6.3 (*)    Albumin 3.1 (*)    AST 61 (*)    Alkaline Phosphatase 522 (*)    All other components within normal limits  TYPE AND SCREEN  PREPARE RBC (CROSSMATCH)    EKG EKG Interpretation  Date/Time:  Wednesday March 21 2023 12:58:28 EDT Ventricular Rate:  70 PR Interval:  188 QRS Duration: 108 QT Interval:  424 QTC Calculation: 458 R Axis:   55 Text Interpretation: Sinus rhythm T wave abnormality Confirmed by Gerhard Munch [phone]) on 03/21/2023 1:59:21 PM  Radiology No results found.  Procedures Procedures    Medications Ordered in ED Medications  0.9 %  sodium chloride infusion (Manually program via Guardrails IV Fluids) (has no administration in time range)    ED Course/ Medical Decision Making/ A&P                             Medical Decision Making Patient presents with family members who assists with the history.  Patient has a history of metastatic prostate cancer, is transitioning to quality of life goal-directed therapy.  He presents today, however, with weakness, pallor, concern for possible bleeding per outside hospital.  Patient, his wife and I discussed goals of care, the patient is amenable to transfusion here after his repeat hemoglobin value was 6.8.  ECG similar to prior, no  chest pain, low suspicion for acute other new phenomenon beyond metastatic cancer.  Patient will receive blood transfusion, may be appropriate for discharge following that to follow-up with his oncology team and palliative care team.  Amount and/or Complexity of Data Reviewed Independent Historian: spouse External Data Reviewed: notes. Labs: ordered. Decision-making details documented in ED Course. ECG/medicine tests: ordered and independent interpretation performed. Decision-making details documented in  ED Course.  Risk Prescription drug management. Decision regarding hospitalization.  Critical Care Total time providing critical care: 35 minutes   Final Clinical Impression(s) / ED Diagnoses Final diagnoses:  Symptomatic anemia    Rx / DC Orders ED Discharge Orders     None         Gerhard Munch, MD 03/21/23 1402

## 2023-03-22 ENCOUNTER — Ambulatory Visit: Payer: No Typology Code available for payment source | Admitting: Family Medicine

## 2023-03-22 ENCOUNTER — Telehealth: Payer: Self-pay | Admitting: Family Medicine

## 2023-03-22 LAB — CBC WITH DIFFERENTIAL/PLATELET
Basophils Absolute: 0 10*3/uL (ref 0.0–0.2)
Basos: 1 %
EOS (ABSOLUTE): 0.2 10*3/uL (ref 0.0–0.4)
Eos: 5 %
Hematocrit: 21.8 % — ABNORMAL LOW (ref 37.5–51.0)
Hemoglobin: 7.1 g/dL — CL (ref 13.0–17.7)
Lymphocytes Absolute: 1.2 10*3/uL (ref 0.7–3.1)
Lymphs: 26 %
MCH: 30.1 pg (ref 26.6–33.0)
MCHC: 32.6 g/dL (ref 31.5–35.7)
MCV: 92 fL (ref 79–97)
Monocytes Absolute: 0.1 10*3/uL (ref 0.1–0.9)
Monocytes: 3 %
NRBC: 2 % — ABNORMAL HIGH (ref 0–0)
Neutrophils Absolute: 3 10*3/uL (ref 1.4–7.0)
Neutrophils: 63 %
Platelets: 136 10*3/uL — ABNORMAL LOW (ref 150–450)
RBC: 2.36 x10E6/uL — CL (ref 4.14–5.80)
RDW: 15.9 % — ABNORMAL HIGH (ref 11.6–15.4)
WBC: 4.7 10*3/uL (ref 3.4–10.8)

## 2023-03-22 LAB — TYPE AND SCREEN
ABO/RH(D): B POS
Antibody Screen: NEGATIVE
Unit division: 0
Unit division: 0

## 2023-03-22 LAB — BPAM RBC
Blood Product Expiration Date: 202405102359
ISSUE DATE / TIME: 202404101707
Unit Type and Rh: 1700
Unit Type and Rh: 9500

## 2023-03-22 LAB — IMMATURE CELLS
MYELOCYTES: 1 % — ABNORMAL HIGH (ref 0–0)
Metamyelocytes: 1 % — ABNORMAL HIGH (ref 0–0)

## 2023-03-22 NOTE — Telephone Encounter (Signed)
Okay yes I saw that, have him follow-up with me after he gets.

## 2023-03-22 NOTE — Telephone Encounter (Signed)
Appt has been scheduled for 4/22 at 3:35. Wife made aware.

## 2023-04-02 ENCOUNTER — Ambulatory Visit (INDEPENDENT_AMBULATORY_CARE_PROVIDER_SITE_OTHER): Payer: No Typology Code available for payment source | Admitting: Family Medicine

## 2023-04-02 ENCOUNTER — Encounter: Payer: Self-pay | Admitting: Family Medicine

## 2023-04-02 VITALS — BP 180/72 | HR 88 | Ht 65.0 in | Wt 136.0 lb

## 2023-04-02 DIAGNOSIS — D649 Anemia, unspecified: Secondary | ICD-10-CM | POA: Diagnosis not present

## 2023-04-02 MED ORDER — FERROUS SULFATE 325 (65 FE) MG PO TABS: 325.0000 mg | ORAL_TABLET | Freq: Every day | ORAL | 3 refills | Status: DC

## 2023-04-02 NOTE — Progress Notes (Signed)
BP (!) 180/72   Pulse 88   Ht 5\' 5"  (1.651 m)   Wt 136 lb (61.7 kg)   SpO2 100%   BMI 22.63 kg/m    Subjective:   Patient ID: Alan [name]., male    DOB: 03-24-1941, 82 y.o.   MRN: [id number]  HPI: Ruble Pumphrey. is a 82 y.o. male presenting on 04/02/2023 for Hospitalization Follow-up (Weak, tired, no pain, no sob)   HPI Hospital follow-up for symptomatic anemia.   Patient is coming in today with still feeling weak and tired and short of breath.  He did feel better for a day or 2 after he got a transfusion at the hospital but he still just feels like his energy is down.  He has been sitting most of the time so is starting get some peripheral edema in his legs.  He feels somewhat short of breath.  He denies any chest pain or palpitations or flutters.  He is saying he has dark stools but denies any bright red blood in the stool.  He is still taking his iron.  Per family he still looks pale and his energy is still down although slightly improved from the hospital.  His blood pressure is elevated today but I did stop some of his blood pressure medicines.  Relevant past medical, surgical, family and social history reviewed and updated as indicated. Interim medical history since our last visit reviewed. Allergies and medications reviewed and updated.  Review of Systems  Constitutional:  Positive for fatigue. Negative for chills and fever.  Eyes:  Negative for visual disturbance.  Respiratory:  Positive for shortness of breath. Negative for chest tightness and wheezing.   Cardiovascular:  Negative for chest pain and leg swelling.  Musculoskeletal:  Negative for back pain and gait problem.  Skin:  Negative for rash.  Neurological:  Positive for weakness. Negative for dizziness and light-headedness.  All other systems reviewed and are negative.   Per HPI unless specifically indicated above   Allergies as of 04/02/2023       Reactions   Allopurinol Other (See Comments)   Significantly  decreased WBC's   Colchicine Other (See Comments)   Significantly decreased WBC's        Medication List        Accurate as of April 02, 2023  4:18 PM. If you have any questions, ask your nurse or doctor.          acetaminophen 325 MG tablet Commonly known as: TYLENOL Take 2 tablets (650 mg total) by mouth every 6 (six) hours as needed for mild pain, fever or headache (or Fever >/= 101).   Blood Pressure Kit 1 each by Does not apply route daily.   calcium carbonate 1500 (600 Ca) MG Tabs tablet Commonly known as: OSCAL Take by mouth 2 (two) times daily with a meal.   diclofenac Sodium 1 % Gel Commonly known as: VOLTAREN Apply 1 application topically 2 (two) times daily as needed (pain).   ferrous sulfate 325 (65 FE) MG tablet Commonly known as: SV Iron Take 1 tablet (325 mg total) by mouth daily with breakfast.   fluticasone 50 MCG/ACT nasal spray Commonly known as: FLONASE Place 1 spray into both nostrils 2 (two) times daily as needed for allergies or rhinitis.   glucose blood test strip Commonly known as: ONE TOUCH ULTRA TEST Use to check BG once daily.  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   magnesium oxide 400 (240 Mg) MG tablet Commonly known as: MAG-OX Take 1 tablet (400 mg total) by mouth in the morning, at noon, and at bedtime.   metFORMIN 1000 MG tablet Commonly known as: GLUCOPHAGE Take 1 tablet (1,000 mg total) by mouth 2 (two) times daily with a meal.   metoprolol succinate 100 MG 24 hr tablet Commonly known as: TOPROL-XL Take 1 tablet (100 mg total) by mouth daily. Take with or immediately following a meal.   omeprazole 20 MG capsule Commonly known as: PRILOSEC Take 1 capsule (20 mg total) by mouth daily.   ONE TOUCH ULTRA MINI w/Device Kit 1 each by Does not apply route 2 (two) times daily.    predniSONE 5 MG tablet Commonly known as: DELTASONE Take 1 tablet by mouth once daily with breakfast   traZODone 50 MG tablet Commonly known as: DESYREL Take 0.5-1 tablets (25-50 mg total) by mouth at bedtime as needed for sleep.         Objective:   BP (!) 180/72   Pulse 88   Ht  (1.651 m)   Wt 136 lb (61.7 kg)   SpO2 100%   BMI 22.63 kg/m   Wt Readings from Last 3 Encounters:  04/02/23 136 lb (61.7 kg)  03/21/23 137 lb 15 oz (62.6 kg)  03/19/23 138 lb (62.6 kg)    Physical Exam Vitals and nursing note reviewed.  Constitutional:      General: He is not in acute distress.    Appearance: He is well-developed. He is not diaphoretic.  Eyes:     General: No scleral icterus.    Conjunctiva/sclera: Conjunctivae normal.  Neck:     Thyroid: No thyromegaly.  Cardiovascular:     Rate and Rhythm: Normal rate. Rhythm irregular.     Heart sounds: Normal heart sounds. No murmur heard. Pulmonary:     Effort: Pulmonary effort is normal. No respiratory distress.     Breath sounds: Normal breath sounds. No wheezing, rhonchi or rales.  Musculoskeletal:        General: No swelling. Normal range of motion.     Cervical back: Neck supple.  Lymphadenopathy:     Cervical: No cervical adenopathy.  Skin:    General: Skin is warm and dry.     Findings: No rash.  Neurological:     Mental Status: He is alert and oriented to person, place, and time.     Coordination: Coordination normal.  Psychiatric:        Behavior: Behavior normal.     Assessment & Plan:   Problem List Items Addressed This Visit       Other   Symptomatic anemia   Relevant Medications   ferrous sulfate (SV IRON) 325 (65 FE) MG tablet   Other Relevant Orders   CBC with Differential/Platelet   CMP14+EGFR   Hemoglobin, fingerstick   Fecal occult blood, imunochemical([facility])   Other Visit Diagnoses     Low hemoglobin    -  Primary   Relevant Orders   CBC with Differential/Platelet    CMP14+EGFR   Hemoglobin, fingerstick   Fecal occult blood, imunochemical([facility])     Will check hemoglobin and blood counts today.  Follow-up in 2 weeks  Recheck hemoglobin and blood counts in 2 weeks.  Will also have him do a stool card to make it back for [place]  Follow up plan:  Return if symptoms worsen or fail to improve.  Counseling provided for all of the vaccine components Orders Placed This Encounter  Procedures   Fecal occult blood, imunochemical([facility])   CBC with Differential/Platelet   CMP14+EGFR   Hemoglobin, fingerstick    Arville Care, MD [facility] 04/02/2023, 4:18 PM

## 2023-04-03 ENCOUNTER — Telehealth: Payer: Self-pay | Admitting: Family Medicine

## 2023-04-03 LAB — IMMATURE CELLS: Metamyelocytes: 1 % — ABNORMAL HIGH (ref 0–0)

## 2023-04-03 LAB — CBC WITH DIFFERENTIAL/PLATELET
Basophils Absolute: 0.1 10*3/uL (ref 0.0–0.2)
Basos: 1 %
EOS (ABSOLUTE): 0.1 10*3/uL (ref 0.0–0.4)
Eos: 1 %
Hematocrit: 29.2 % — ABNORMAL LOW (ref 37.5–51.0)
Hemoglobin: 10 g/dL — ABNORMAL LOW (ref 13.0–17.7)
Lymphocytes Absolute: 0.8 10*3/uL (ref 0.7–3.1)
Lymphs: 15 %
MCH: 30.2 pg (ref 26.6–33.0)
MCHC: 34.2 g/dL (ref 31.5–35.7)
MCV: 88 fL (ref 79–97)
Monocytes Absolute: 0.7 10*3/uL (ref 0.1–0.9)
Monocytes: 13 %
NRBC: 5 % — ABNORMAL HIGH (ref 0–0)
Neutrophils Absolute: 3.6 10*3/uL (ref 1.4–7.0)
Neutrophils: 69 %
Platelets: 167 10*3/uL (ref 150–450)
RBC: 3.31 x10E6/uL — ABNORMAL LOW (ref 4.14–5.80)
RDW: 15.5 % — ABNORMAL HIGH (ref 11.6–15.4)
WBC: 5.2 10*3/uL (ref 3.4–10.8)

## 2023-04-03 LAB — CMP14+EGFR
ALT: 14 IU/L (ref 0–44)
AST: 66 IU/L — ABNORMAL HIGH (ref 0–40)
Albumin/Globulin Ratio: 1.5 (ref 1.2–2.2)
Albumin: 3.8 g/dL (ref 3.8–4.8)
Alkaline Phosphatase: 911 IU/L — ABNORMAL HIGH (ref 44–121)
BUN/Creatinine Ratio: 18 (ref 10–24)
BUN: 16 mg/dL (ref 8–27)
Bilirubin Total: 1 mg/dL (ref 0.0–1.2)
CO2: 15 mmol/L — ABNORMAL LOW (ref 20–29)
Calcium: 9.1 mg/dL (ref 8.6–10.2)
Chloride: 103 mmol/L (ref 96–106)
Creatinine, Ser: 0.9 mg/dL (ref 0.76–1.27)
Globulin, Total: 2.5 g/dL (ref 1.5–4.5)
Glucose: 115 mg/dL — ABNORMAL HIGH (ref 70–99)
Potassium: 4.2 mmol/L (ref 3.5–5.2)
Sodium: 139 mmol/L (ref 134–144)
Total Protein: 6.3 g/dL (ref 6.0–8.5)
eGFR: 86 mL/min/{1.73_m2} (ref >59)

## 2023-04-03 LAB — HEMOGLOBIN, FINGERSTICK: Hemoglobin: 9.8 g/dL — ABNORMAL LOW (ref 12.6–17.7)

## 2023-04-03 NOTE — Telephone Encounter (Signed)
  Prescription Request  04/03/2023  Is this a "Controlled Substance" medicine? Yes  Have you seen your PCP in the last 2 weeks? Yes yesterday by Dr. Algis Downs forgot to tell him he has one pill left  If YES, route message to pool  -  If NO, patient needs to be scheduled for appointment.  What is the name of the medication or equipment? HYDROcodone-acetaminophen (NORCO) 7.5-325 MG tablet   Have you contacted your pharmacy to request a refill? yes   Which pharmacy would you like this sent to? [facility]    Patient notified that their request is being sent to the clinical staff for review and that they should receive a response within 2 business days.

## 2023-04-04 ENCOUNTER — Other Ambulatory Visit: Payer: Self-pay | Admitting: Family Medicine

## 2023-04-04 DIAGNOSIS — C7951 Secondary malignant neoplasm of bone: Secondary | ICD-10-CM

## 2023-04-04 DIAGNOSIS — Z515 Encounter for palliative care: Secondary | ICD-10-CM

## 2023-04-04 MED ORDER — HYDROCODONE-ACETAMINOPHEN 7.5-325 MG PO TABS
1.0000 | ORAL_TABLET | Freq: Two times a day (BID) | ORAL | 0 refills | Status: DC | PRN
Start: 2023-04-04 — End: 2023-04-19

## 2023-04-04 NOTE — Progress Notes (Signed)
Sent 1 refill because this is new to him and was prescribed previously by the emergency department but from here about this medication can only be refilled during visit because of its controlled nature.

## 2023-04-04 NOTE — Progress Notes (Signed)
Pts wife made aware. No further concerns.

## 2023-04-11 ENCOUNTER — Telehealth: Payer: Self-pay

## 2023-04-11 NOTE — Telephone Encounter (Signed)
Patient's wife called and advised Dr. Ellin Saba stopped all injections/ treatments due to patients health declining. The family was advised to contact Hospice. Per patients wife he is doing better. Patient's wife canceled his Deborra Medina but wanted to know if he needs to follow up with you or is he is ok to follow up as needed.   [phone]

## 2023-04-12 ENCOUNTER — Encounter: Payer: Self-pay | Admitting: Family Medicine

## 2023-04-12 ENCOUNTER — Ambulatory Visit (INDEPENDENT_AMBULATORY_CARE_PROVIDER_SITE_OTHER): Payer: No Typology Code available for payment source | Admitting: Family Medicine

## 2023-04-12 VITALS — BP 152/58 | HR 45 | Ht 65.0 in | Wt 135.0 lb

## 2023-04-12 DIAGNOSIS — Z86718 Personal history of other venous thrombosis and embolism: Secondary | ICD-10-CM

## 2023-04-12 DIAGNOSIS — Z7901 Long term (current) use of anticoagulants: Secondary | ICD-10-CM

## 2023-04-12 DIAGNOSIS — D649 Anemia, unspecified: Secondary | ICD-10-CM | POA: Diagnosis not present

## 2023-04-12 DIAGNOSIS — D6859 Other primary thrombophilia: Secondary | ICD-10-CM

## 2023-04-12 DIAGNOSIS — I480 Paroxysmal atrial fibrillation: Secondary | ICD-10-CM

## 2023-04-12 NOTE — Progress Notes (Signed)
BP (!) 152/58   Pulse (!) 45   Ht 5\' 5"  (1.651 m)   Wt 135 lb (61.2 kg)   SpO2 97%   BMI 22.47 kg/m    Subjective:   Patient ID: Alan Heading., male    DOB: 11-15-1941, 82 y.o.   MRN: [id number]  HPI: Alan Xia. is a 82 y.o. male presenting on 04/12/2023 for Medical Management of Chronic Issues and Atrial Fibrillation   HPI Coumadin recheck Target goal: 2.0-3.0, patient is not on Coumadin anymore.  As he was having bleeding. Reason on anticoagulation: A-fib with history of DVT Patient denies any bruising or bleeding or chest pain or palpitations   Relevant past medical, surgical, family and social history reviewed and updated as indicated. Interim medical history since our last visit reviewed. Allergies and medications reviewed and updated.  Review of Systems  Constitutional:  Negative for chills and fever.  Eyes:  Negative for visual disturbance.  Respiratory:  Negative for shortness of breath and wheezing.   Cardiovascular:  Negative for chest pain and leg swelling.  Musculoskeletal:  Negative for back pain and gait problem.  Skin:  Negative for rash.  Neurological:  Negative for dizziness, weakness and light-headedness.  All other systems reviewed and are negative.   Per HPI unless specifically indicated above   Allergies as of 04/12/2023       Reactions   Allopurinol Other (See Comments)   Significantly decreased WBC's   Colchicine Other (See Comments)   Significantly decreased WBC's        Medication List        Accurate as of Apr 12, 2023  4:50 PM. If you have any questions, ask your nurse or doctor.          STOP taking these medications    predniSONE 5 MG tablet Commonly known as: DELTASONE Stopped by: Elige Radon Johnney Scarlata, MD       TAKE these medications    acetaminophen 325 MG tablet Commonly known as: TYLENOL Take 2 tablets (650 mg total) by mouth every 6 (six) hours as needed for mild pain, fever or headache (or Fever >/= 101).    Blood Pressure Kit 1 each by Does not apply route daily.   calcium carbonate 1500 (600 Ca) MG Tabs tablet Commonly known as: OSCAL Take by mouth 2 (two) times daily with a meal.   diclofenac Sodium 1 % Gel Commonly known as: VOLTAREN Apply 1 application topically 2 (two) times daily as needed (pain).   ferrous sulfate 325 (65 FE) MG tablet Commonly known as: SV Iron Take 1 tablet (325 mg total) by mouth daily with breakfast.   fluticasone 50 MCG/ACT nasal spray Commonly known as: FLONASE Place 1 spray into both nostrils 2 (two) times daily as needed for allergies or rhinitis.   glucose blood test strip Commonly known as: ONE TOUCH ULTRA TEST Use to check BG once daily.  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   magnesium oxide 400 (240 Mg) MG tablet Commonly known as: MAG-OX Take 1 tablet (400 mg total) by mouth in the morning, at noon, and at bedtime.   metFORMIN 1000 MG tablet Commonly known as: GLUCOPHAGE Take 1 tablet (1,000 mg total) by mouth 2 (two) times daily with a meal.   metoprolol succinate 100 MG 24 hr tablet Commonly known as: TOPROL-XL  Take 1 tablet (100 mg total) by mouth daily. Take with or immediately following a meal.   omeprazole 20 MG capsule Commonly known as: PRILOSEC Take 1 capsule (20 mg total) by mouth daily.   ONE TOUCH ULTRA MINI w/Device Kit 1 each by Does not apply route 2 (two) times daily.   traZODone 50 MG tablet Commonly known as: DESYREL Take 0.5-1 tablets (25-50 mg total) by mouth at bedtime as needed for sleep.         Objective:   BP (!) 152/58   Pulse (!) 45   Ht 5\' 5"  (1.651 m)   Wt 135 lb (61.2 kg)   SpO2 97%   BMI 22.47 kg/m   Wt Readings from Last 3 Encounters:  04/12/23 135 lb (61.2 kg)  04/02/23 136 lb (61.7 kg)  03/21/23 137 lb 15 oz (62.6 kg)    Physical Exam Vitals  and nursing note reviewed.  Constitutional:      General: He is not in acute distress.    Appearance: He is well-developed. He is not diaphoretic.  Eyes:     General: No scleral icterus.    Conjunctiva/sclera: Conjunctivae normal.  Neck:     Thyroid: No thyromegaly.  Cardiovascular:     Rate and Rhythm: Normal rate. Rhythm irregular.     Heart sounds: Normal heart sounds. No murmur heard. Pulmonary:     Effort: Pulmonary effort is normal. No respiratory distress.     Breath sounds: Normal breath sounds. No wheezing.  Musculoskeletal:        General: Normal range of motion.     Cervical back: Neck supple.  Lymphadenopathy:     Cervical: No cervical adenopathy.  Skin:    General: Skin is warm and dry.     Findings: No rash.  Neurological:     Mental Status: He is alert and oriented to person, place, and time.     Coordination: Coordination normal.  Psychiatric:        Behavior: Behavior normal.       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  Counseling provided for all of the vaccine components Orders Placed This Encounter  Procedures   CoaguChek XS/INR Waived   CBC with Differential/Platelet    Arville Care, MD [facility] 04/12/2023, 4:50 PM

## 2023-04-13 ENCOUNTER — Other Ambulatory Visit: Payer: Self-pay

## 2023-04-13 DIAGNOSIS — D649 Anemia, unspecified: Secondary | ICD-10-CM

## 2023-04-13 LAB — CBC WITH DIFFERENTIAL/PLATELET
Basophils Absolute: 0.1 10*3/uL (ref 0.0–0.2)
Basos: 1 %
EOS (ABSOLUTE): 0 10*3/uL (ref 0.0–0.4)
Eos: 0 %
Hematocrit: 24 % — ABNORMAL LOW (ref 37.5–51.0)
Hemoglobin: 8.3 g/dL — CL (ref 13.0–17.7)
Lymphocytes Absolute: 0.8 10*3/uL (ref 0.7–3.1)
Lymphs: 12 %
MCH: 30.2 pg (ref 26.6–33.0)
MCHC: 34.6 g/dL (ref 31.5–35.7)
MCV: 87 fL (ref 79–97)
Monocytes Absolute: 0.3 10*3/uL (ref 0.1–0.9)
Monocytes: 4 %
NRBC: 5 % — ABNORMAL HIGH (ref 0–0)
Neutrophils Absolute: 5 10*3/uL (ref 1.4–7.0)
Neutrophils: 69 %
Platelets: 158 10*3/uL (ref 150–450)
RBC: 2.75 x10E6/uL — ABNORMAL LOW (ref 4.14–5.80)
RDW: 15.6 % — ABNORMAL HIGH (ref 11.6–15.4)
WBC: 6.6 10*3/uL (ref 3.4–10.8)

## 2023-04-13 LAB — COAGUCHEK XS/INR WAIVED
INR: 1.2 — ABNORMAL HIGH (ref 0.9–1.1)
Prothrombin Time: 14 s

## 2023-04-13 LAB — IMMATURE CELLS
Bands(Auto) Relative: 6 %
Metamyelocytes: 8 % — ABNORMAL HIGH (ref 0–0)

## 2023-04-13 LAB — FECAL OCCULT BLOOD, IMMUNOCHEMICAL: Fecal Occult Bld: NEGATIVE

## 2023-04-16 ENCOUNTER — Ambulatory Visit: Payer: No Typology Code available for payment source

## 2023-04-16 ENCOUNTER — Other Ambulatory Visit: Payer: No Typology Code available for payment source

## 2023-04-16 DIAGNOSIS — D649 Anemia, unspecified: Secondary | ICD-10-CM | POA: Diagnosis not present

## 2023-04-17 ENCOUNTER — Telehealth: Payer: Self-pay | Admitting: Family Medicine

## 2023-04-17 LAB — CBC WITH DIFFERENTIAL/PLATELET
Basophils Absolute: 0 10*3/uL (ref 0.0–0.2)
Basos: 1 %
EOS (ABSOLUTE): 0.1 10*3/uL (ref 0.0–0.4)
Eos: 2 %
Hematocrit: 23.5 % — ABNORMAL LOW (ref 37.5–51.0)
Hemoglobin: 7.8 g/dL — CL (ref 13.0–17.7)
Lymphocytes Absolute: 0.5 10*3/uL — ABNORMAL LOW (ref 0.7–3.1)
Lymphs: 13 %
MCH: 29.7 pg (ref 26.6–33.0)
MCHC: 33.2 g/dL (ref 31.5–35.7)
MCV: 89 fL (ref 79–97)
Monocytes Absolute: 0.2 10*3/uL (ref 0.1–0.9)
Monocytes: 6 %
NRBC: 6 % — ABNORMAL HIGH (ref 0–0)
Neutrophils Absolute: 3 10*3/uL (ref 1.4–7.0)
Neutrophils: 74 %
Platelets: 137 10*3/uL — ABNORMAL LOW (ref 150–450)
RBC: 2.63 x10E6/uL — CL (ref 4.14–5.80)
RDW: 15.6 % — ABNORMAL HIGH (ref 11.6–15.4)
WBC: 4 10*3/uL (ref 3.4–10.8)

## 2023-04-17 LAB — IMMATURE CELLS: Metamyelocytes: 4 % — ABNORMAL HIGH (ref 0–0)

## 2023-04-17 NOTE — Telephone Encounter (Signed)
Informed wife, Randa Evens that since this medication is controlled that the pt will need to be seen in the office. Dr. Louanne Skye only sent one refill originally because the original Rx was prescribed by the hospital.  Dr. Louanne Skye prescribed #30 on 4/24. Instructions are to take 2 daily as needed.  Per wife pt only has two pills left. States that she has been giving him one about every 4 hours.  Explained that he is taking med too frequently.  Wife will supplement with Tylenol for now.  Appt has been scheduled with Dr. Louanne Skye on 5/31 to discuss the controlled medication.  Will send message to covering provider to review. Wife is expecting a call back with response but she has been informed that pt will need to be seen in the office with Dr. Louanne Skye for a refill.

## 2023-04-17 NOTE — Telephone Encounter (Signed)
  Prescription Request  04/17/2023  Is this a "Controlled Substance" medicine? yes  Have you seen your PCP in the last 2 weeks? Yes on 04/12/23  If YES, route message to pool  -  If NO, patient needs to be scheduled for appointment.  What is the name of the medication or equipment? HYDROcodone-acetaminophen (NORCO) 7.5-325 MG tablet   Have you contacted your pharmacy to request a refill? no   Which pharmacy would you like this sent to? [facility]    Patient notified that their request is being sent to the clinical staff for review and that they should receive a response within 2 business days.

## 2023-04-17 NOTE — Telephone Encounter (Signed)
Pt needs to be seen in office for controlled medication.

## 2023-04-17 NOTE — Telephone Encounter (Signed)
I will see patient if he can not wait til 05/11/23 with PCP.

## 2023-04-17 NOTE — Telephone Encounter (Signed)
Patient wife aware states he can not wait appt made with [facility]

## 2023-04-19 ENCOUNTER — Ambulatory Visit (INDEPENDENT_AMBULATORY_CARE_PROVIDER_SITE_OTHER): Payer: No Typology Code available for payment source | Admitting: Family

## 2023-04-19 ENCOUNTER — Encounter: Payer: Self-pay | Admitting: Family

## 2023-04-19 VITALS — BP 147/61 | HR 93 | Temp 97.8°F | Ht 65.0 in

## 2023-04-19 DIAGNOSIS — C7951 Secondary malignant neoplasm of bone: Secondary | ICD-10-CM | POA: Diagnosis not present

## 2023-04-19 DIAGNOSIS — D649 Anemia, unspecified: Secondary | ICD-10-CM | POA: Diagnosis not present

## 2023-04-19 DIAGNOSIS — C61 Malignant neoplasm of prostate: Secondary | ICD-10-CM

## 2023-04-19 DIAGNOSIS — Z515 Encounter for palliative care: Secondary | ICD-10-CM

## 2023-04-19 LAB — HEMOGLOBIN, FINGERSTICK: Hemoglobin: 7.3 g/dL — ABNORMAL LOW (ref 12.6–17.7)

## 2023-04-19 MED ORDER — HYDROCODONE-ACETAMINOPHEN 7.5-325 MG PO TABS
1.0000 | ORAL_TABLET | Freq: Two times a day (BID) | ORAL | 0 refills | Status: DC | PRN
Start: 2023-04-19 — End: 2023-05-11

## 2023-04-19 NOTE — Progress Notes (Signed)
Subjective:    Patient ID: Alan Heading., male    DOB: 25-Jul-1941, 82 y.o.   MRN: [id number]  Chief Complaint  Patient presents with   Pain Management   Pt presents to the office today for pain management. His PCP is out of town. 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 He was given  Norco 7.5-325 mg #30 on 04/04/23. He is out pain medication.   He is very pale and have SOB today.  Anemia Presents for follow-up visit. Symptoms include bruises/bleeds easily, malaise/fatigue and pallor.      Review of Systems  Constitutional:  Positive for malaise/fatigue.  Skin:  Positive for pallor.  Hematological:  Bruises/bleeds easily.  All other systems reviewed and are negative.      Objective:   Physical Exam Vitals reviewed.  Constitutional:      General: He is not in acute distress.    Appearance: He is well-developed.  HENT:     Head: Normocephalic.  Eyes:     General:        Right eye: No discharge.        Left eye: No discharge.     Pupils: Pupils are equal, round, and reactive to light.  Neck:     Thyroid: No thyromegaly.  Cardiovascular:     Rate and Rhythm: Normal rate and regular rhythm.     Heart sounds: Normal heart sounds. No murmur heard. Pulmonary:     Effort: Pulmonary effort is normal. No respiratory distress.     Breath sounds: Normal breath sounds. No wheezing.  Abdominal:     General: Bowel sounds are normal. There is no distension.     Palpations: Abdomen is soft.     Tenderness: There is no abdominal tenderness.  Musculoskeletal:        General: No tenderness. Normal range of motion.     Cervical back: Normal range of motion and neck supple.  Skin:    General: Skin is warm and dry.     Coloration: Skin is pale.     Findings: No erythema or rash.  Neurological:     Mental Status: He is alert and oriented to person, place, and time.     Cranial Nerves: No cranial nerve deficit.     Motor: Weakness present.     Gait: Gait abnormal.      Deep Tendon Reflexes: Reflexes are normal and symmetric.  Psychiatric:        Behavior: Behavior normal.        Thought Content: Thought content normal.        Judgment: Judgment normal.      BP (!) 147/61   Pulse 93   Temp 97.8 F (36.6 C) (Temporal)   Ht 5\' 5"  (1.651 m)   SpO2 100%   BMI 22.47 kg/m       Assessment & Plan:  Alan Heading. comes in today with chief complaint of Pain Management   Diagnosis and orders addressed:  1. Low hemoglobin - Hemoglobin, fingerstick - CBC  2. Prostate cancer (HCC) - HYDROcodone-acetaminophen (NORCO) 7.5-325 MG tablet; Take 1 tablet by mouth 2 (two) times daily as needed for moderate pain.  Dispense: 90 tablet; Refill: 0 - Ambulatory referral to Hematology / Oncology  3. Normocytic anemia  4. Malignant neoplasm metastatic to bone (HCC) - HYDROcodone-acetaminophen (NORCO) 7.5-325 MG tablet; Take 1 tablet by mouth 2 (two) times daily as needed for moderate pain.  Dispense: 90 tablet; Refill:  0 - Ambulatory referral to Hematology / Oncology  5. Encounter for admission to hospice care - HYDROcodone-acetaminophen (NORCO) 7.5-325 MG tablet; Take 1 tablet by mouth 2 (two) times daily as needed for moderate pain.  Dispense: 90 tablet; Refill: 0   Given pain and needing transfusion, I have placed referral to Oncologists so he can have transfusion as outpatient and not in ED.  Hgb in office is 7.2, will hold off on sending to ED unless CBC is <7 I will give Norco #90 and follow up in 1 month for PCP      Jannifer Rodney, FNP

## 2023-04-19 NOTE — Patient Instructions (Signed)

## 2023-04-20 LAB — CBC
Hematocrit: 21.6 % — ABNORMAL LOW (ref 37.5–51.0)
Hemoglobin: 7.2 g/dL — CL (ref 13.0–17.7)
MCH: 30.1 pg (ref 26.6–33.0)
MCHC: 33.3 g/dL (ref 31.5–35.7)
MCV: 90 fL (ref 79–97)
NRBC: 3 % — ABNORMAL HIGH (ref 0–0)
Platelets: 122 10*3/uL — ABNORMAL LOW (ref 150–450)
RBC: 2.39 x10E6/uL — CL (ref 4.14–5.80)
RDW: 15.7 % — ABNORMAL HIGH (ref 11.6–15.4)
WBC: 4.1 10*3/uL (ref 3.4–10.8)

## 2023-04-23 ENCOUNTER — Telehealth: Payer: Self-pay | Admitting: Family Medicine

## 2023-04-23 DIAGNOSIS — C7951 Secondary malignant neoplasm of bone: Secondary | ICD-10-CM

## 2023-04-24 NOTE — Telephone Encounter (Signed)
I have placed an urgent referral to Hospice. They can discuss pain management. Have they heard from Oncologists office?

## 2023-04-24 NOTE — Telephone Encounter (Signed)
Patient aware and verbalized understanding. Patient has not heard from hospice or pain management

## 2023-04-26 ENCOUNTER — Ambulatory Visit: Payer: No Typology Code available for payment source

## 2023-05-03 ENCOUNTER — Telehealth: Payer: Self-pay | Admitting: Family Medicine

## 2023-05-03 ENCOUNTER — Other Ambulatory Visit: Payer: Self-pay

## 2023-05-03 ENCOUNTER — Emergency Department (HOSPITAL_COMMUNITY)
Admission: EM | Admit: 2023-05-03 | Discharge: 2023-05-03 | Disposition: A | Discharge status: Home / Self Care | Payer: Medicare Other | Attending: Emergency Medicine | Admitting: Emergency Medicine

## 2023-05-03 DIAGNOSIS — R339 Retention of urine, unspecified: Secondary | ICD-10-CM | POA: Diagnosis present

## 2023-05-03 DIAGNOSIS — I480 Paroxysmal atrial fibrillation: Secondary | ICD-10-CM | POA: Diagnosis not present

## 2023-05-03 DIAGNOSIS — E119 Type 2 diabetes mellitus without complications: Secondary | ICD-10-CM | POA: Insufficient documentation

## 2023-05-03 DIAGNOSIS — I10 Essential (primary) hypertension: Secondary | ICD-10-CM | POA: Diagnosis not present

## 2023-05-03 DIAGNOSIS — Z8546 Personal history of malignant neoplasm of prostate: Secondary | ICD-10-CM | POA: Insufficient documentation

## 2023-05-03 DIAGNOSIS — Z466 Encounter for fitting and adjustment of urinary device: Secondary | ICD-10-CM

## 2023-05-03 DIAGNOSIS — Z7984 Long term (current) use of oral hypoglycemic drugs: Secondary | ICD-10-CM | POA: Insufficient documentation

## 2023-05-03 LAB — URINALYSIS, W/ REFLEX TO CULTURE (INFECTION SUSPECTED)
Bilirubin Urine: NEGATIVE
Glucose, UA: NEGATIVE mg/dL
Hgb urine dipstick: NEGATIVE
Ketones, ur: NEGATIVE mg/dL
Leukocytes,Ua: NEGATIVE
Nitrite: NEGATIVE
Protein, ur: 100 mg/dL — AB
Specific Gravity, Urine: 1.024 (ref 1.005–1.030)
pH: 5 (ref 5.0–8.0)

## 2023-05-03 MED ORDER — HYDROCODONE-ACETAMINOPHEN 5-325 MG PO TABS
1.0000 | ORAL_TABLET | Freq: Once | ORAL | Status: AC
  Administered 2023-05-03: 1 via ORAL
  Filled 2023-05-03: qty 1

## 2023-05-03 NOTE — ED Triage Notes (Signed)
Pt c/o difficulty urinating, reports he has not been able to void since early this morning. Now having severe back pain.  Bladder scan in triage shows >232.  Hx of prostate cancer.

## 2023-05-03 NOTE — ED Provider Notes (Signed)
[facility] EMERGENCY DEPARTMENT AT [facility] Provider Note   CSN: [id number] Arrival date & time: 05/03/23  0101     History  Chief Complaint  Patient presents with   Urinary Retention    Alan [name]. is a 82 y.o. male.  The history is provided by the patient.  He has history of diabetes, hyperlipidemia, prostate cancer, hypertrophic cardiomyopathy, paroxysmal atrial fibrillation not currently on anticoagulation and comes in because of inability to urinate today.  He was last able to urinate this morning.   Home Medications Prior to Admission medications   Medication Sig Start Date End Date Taking? Authorizing Provider  acetaminophen (TYLENOL) 325 MG tablet Take 2 tablets (650 mg total) by mouth every 6 (six) hours as needed for mild pain, fever or headache (or Fever >/= 101). 03/04/23   Emokpae, Courage, MD  Blood Glucose Monitoring Suppl (ONE TOUCH ULTRA MINI) w/Device KIT 1 each by Does not apply route 2 (two) times daily. 01/14/21   Dettinger, Elige Radon, MD  Blood Pressure KIT 1 each by Does not apply route daily. 08/16/22   Dettinger, Elige Radon, MD  calcium carbonate (OSCAL) 1500 (600 Ca) MG TABS tablet Take by mouth 2 (two) times daily with a meal.    [provider]  diclofenac Sodium (VOLTAREN) 1 % GEL Apply 1 application topically 2 (two) times daily as needed (pain).    [provider]  ferrous sulfate (SV IRON) 325 (65 FE) MG tablet Take 1 tablet (325 mg total) by mouth daily with breakfast. 04/02/23   Dettinger, Elige Radon, MD  fluticasone (FLONASE) 50 MCG/ACT nasal spray Place 1 spray into both nostrils 2 (two) times daily as needed for allergies or rhinitis. 11/05/15   Dettinger, Elige Radon, MD  glucose blood (ONE TOUCH ULTRA TEST) test strip Use to check BG once daily.  Dx:  Type 2 DM controlled E11.9 01/12/23   Dettinger, Elige Radon, MD  HYDROcodone-acetaminophen (NORCO) 7.5-325 MG tablet Take 1 tablet by mouth 2 (two) times daily as needed for moderate  pain. 04/19/23   Jannifer Rodney A, FNP  magnesium oxide (MAG-OX) 400 (240 Mg) MG tablet Take 1 tablet (400 mg total) by mouth in the morning, at noon, and at bedtime. 11/29/22   Doreatha Massed, MD  metFORMIN (GLUCOPHAGE) 1000 MG tablet Take 1 tablet (1,000 mg total) by mouth 2 (two) times daily with a meal. 05/29/22   Dettinger, Elige Radon, MD  metoprolol succinate (TOPROL-XL) 100 MG 24 hr tablet Take 1 tablet (100 mg total) by mouth daily. Take with or immediately following a meal. 01/12/23   Dettinger, Elige Radon, MD  omeprazole (PRILOSEC) 20 MG capsule Take 1 capsule (20 mg total) by mouth daily. 01/12/23   Dettinger, Elige Radon, MD  traZODone (DESYREL) 50 MG tablet Take 0.5-1 tablets (25-50 mg total) by mouth at bedtime as needed for sleep. 03/19/23   Dettinger, Elige Radon, MD      Allergies    Allopurinol and Colchicine    Review of Systems   Review of Systems  All other systems reviewed and are negative.   Physical Exam Updated Vital Signs BP (!) 214/64   Pulse (!) 104   Temp 98.1 F (36.7 C)   Resp (!) 25   Ht 5\' 5"  (1.651 m)   Wt 62 kg   SpO2 100%   BMI 22.75 kg/m  Physical Exam Vitals and nursing note reviewed.   82 year old male, resting comfortably and in no acute  distress. Vital signs are significant for elevated blood pressure, borderline elevated heart rate, slightly elevated respiratory rate. Oxygen saturation is 100%, which is normal. Head is normocephalic and atraumatic. PERRLA, EOMI. . Back is nontender and there is no CVA tenderness. Lungs are clear without rales, wheezes, or rhonchi. Chest is nontender. Heart has regular rate and rhythm without murmur. Abdomen is soft, flat, nontender.  Bladder is moderately distended. Genitalia: Uncircumcised penis.  Large right inguinal hernia present extending down into the scrotum. Extremities have no cyanosis or edema, full range of motion is present. Skin is warm and dry without rash. Neurologic: Mental status is normal,  cranial nerves are intact, moves all extremities equally.  ED Results / Procedures / Treatments   Labs (all labs ordered are listed, but only abnormal results are displayed) Labs Reviewed  URINALYSIS, W/ REFLEX TO CULTURE (INFECTION SUSPECTED) - Abnormal; Notable for the following components:      Result Value   Protein, ur 100 (*)    Bacteria, UA RARE (*)    All other components within normal limits   Procedures Procedures    Medications Ordered in ED Medications  HYDROcodone-acetaminophen (NORCO/VICODIN) 5-325 MG per tablet 1 tablet (1 tablet Oral Given 05/03/23 0155)  HYDROcodone-acetaminophen (NORCO/VICODIN) 5-325 MG per tablet 1 tablet (1 tablet Oral Given 05/03/23 0246)   ED Course/ Medical Decision Making/ A&P                             Medical Decision Making Risk Prescription drug management.   Acute urinary retention.  I have ordered bladder scan and will place Foley catheter if it confirms urinary retention.  Bladder scan was technically difficult because of presence of large inguinal hernia but showed significant amount of urine in the bladder.  I have ordered a Foley catheter placed and urine is significant only for proteinuria, no evidence of infection.  I am discharging him with Foley catheter in place and referring him to urology.  He has requested analgesics for his cancer pain and a dose of hydrocodone-acetaminophen has been ordered.  Final Clinical Impression(s) / ED Diagnoses Final diagnoses:  Urinary retention  Elevated blood pressure reading with diagnosis of hypertension    Rx / DC Orders ED Discharge Orders     None         Dione Booze, MD 05/03/23 0302

## 2023-05-03 NOTE — Telephone Encounter (Signed)
REFERRAL REQUEST Telephone Note  Have you been seen at our office for this problem? Urologist Pt went to hospital last night with a catheter. Pt was told to call urologist to follow up and have the catheter possibly take out (Advise that they may need an appointment with their PCP before a referral can be done)  Reason for Referral: Urologist Pt went to hospital last night with a catheter. Pt was told to call urologist to follow up and have the catheter possibly take out Referral discussed with patient: at the [facility]  Best contact number of patient for referral team: [phone]    Has patient been seen by a specialist for this issue before: no  Patient provider preference for referral: Dr Annabell Howells [phone] Patient location preference for referral: Dr Annabell Howells [phone] in [place]   Patient notified that referrals can take up to a week or longer to process. If they haven't heard anything within a week they should call back and speak with the referral department.

## 2023-05-09 ENCOUNTER — Telehealth: Payer: Self-pay

## 2023-05-09 NOTE — Telephone Encounter (Signed)
The patient's wife called advising that the patient was seen in the Emergency Dept on 5/23. A catheter was placed due to urinary retention and the department physician recommended a f/u for a voiding trial. Your next available will be at the end of July. I wanted to see your recommendation for a follow up.    I will contact the patients wife to make her aware once appt date determine. [phone].     Thank you

## 2023-05-11 ENCOUNTER — Ambulatory Visit (INDEPENDENT_AMBULATORY_CARE_PROVIDER_SITE_OTHER): Payer: No Typology Code available for payment source | Admitting: Family Medicine

## 2023-05-11 ENCOUNTER — Encounter: Payer: Self-pay | Admitting: Family Medicine

## 2023-05-11 VITALS — BP 124/64 | HR 75 | Ht 65.0 in

## 2023-05-11 DIAGNOSIS — D649 Anemia, unspecified: Secondary | ICD-10-CM

## 2023-05-11 DIAGNOSIS — E119 Type 2 diabetes mellitus without complications: Secondary | ICD-10-CM | POA: Diagnosis not present

## 2023-05-11 NOTE — Progress Notes (Signed)
BP 124/64   Pulse 75   Ht 5\' 5"  (1.651 m)   SpO2 95%   BMI 22.75 kg/m    Subjective:   Patient ID: Alan Heading., male    DOB: [birth year], 82 y.o.   MRN: [id number]  HPI: Alan Burgers. is a 82 y.o. male presenting on 05/11/2023 for Medical Management of Chronic Issues   HPI Type 2 diabetes mellitus Patient comes in today for recheck of his diabetes. Patient has been currently taking metformin. Patient is not currently on an ACE inhibitor/ARB. Patient has not seen an ophthalmologist this year. Patient denies any new issues with their feet. The symptom started onset as an adult hypertension and A-fib and cardiomyopathy ARE RELATED TO DM   Anemia recheck Patient is coming in for anemia recheck, still feeling weakness and fatigue.  His last hemoglobin was 7.2.  He is still tired but also taking pain medicines now.  He denies any bleeding or nosebleeds or anything like he was had before.  He has been off the blood thinner now.  Patient is still having a lot of low back pain from patient is a little low back pain from his metastasis from his prostate cancer to his back.  They did increase his medicine from oxycodone to now he uses the fentanyl patches and only uses oxycodone as needed.  Relevant past medical, surgical, family and social history reviewed and updated as indicated. Interim medical history since our last visit reviewed. Allergies and medications reviewed and updated.  Review of Systems  Constitutional:  Negative for chills and fever.  Eyes:  Negative for visual disturbance.  Respiratory:  Negative for shortness of breath and wheezing.   Cardiovascular:  Negative for chest pain and leg swelling.  Musculoskeletal:  Negative for back pain and gait problem.  Skin:  Negative for rash.  Neurological:  Negative for dizziness, weakness and light-headedness.  All other systems reviewed and are negative.   Per HPI unless specifically indicated above   Allergies as of  05/11/2023       Reactions   Allopurinol Other (See Comments)   Significantly decreased WBC's   Colchicine Other (See Comments)   Significantly decreased WBC's        Medication List        Accurate as of May 11, 2023  3:42 PM. If you have any questions, ask your nurse or doctor.          STOP taking these medications    HYDROcodone-acetaminophen 7.5-325 MG tablet Commonly known as: Norco Stopped by: Alan Pyle, MD       TAKE these medications    acetaminophen 325 MG tablet Commonly known as: TYLENOL Take 2 tablets (650 mg total) by mouth every 6 (six) hours as needed for mild pain, fever or headache (or Fever >/= 101).   Blood Pressure Kit 1 each by Does not apply route daily.   calcium carbonate 1500 (600 Ca) MG Tabs tablet Commonly known as: OSCAL Take by mouth 2 (two) times daily with a meal.   diclofenac Sodium 1 % Gel Commonly known as: VOLTAREN Apply 1 application topically 2 (two) times daily as needed (pain).   fentaNYL 12 MCG/HR Commonly known as: DURAGESIC Place 1 patch onto the skin every 3 (three) days.   ferrous sulfate 325 (65 FE) MG tablet Commonly known as: SV Iron Take 1 tablet (325 mg total) by mouth daily with breakfast.   fluticasone 50 MCG/ACT nasal spray Commonly known as:  FLONASE Place 1 spray into both nostrils 2 (two) times daily as needed for allergies or rhinitis.   glucose blood test strip Commonly known as: ONE TOUCH ULTRA TEST Use to check BG once daily.  Dx:  Type 2 DM controlled E11.9   magnesium oxide 400 (240 Mg) MG tablet Commonly known as: MAG-OX Take 1 tablet (400 mg total) by mouth in the morning, at noon, and at bedtime.   metFORMIN 1000 MG tablet Commonly known as: GLUCOPHAGE Take 1 tablet (1,000 mg total) by mouth 2 (two) times daily with a meal.   metoprolol succinate 100 MG 24 hr tablet Commonly known as: TOPROL-XL Take 1 tablet (100 mg total) by mouth daily. Take with or immediately following  a meal.   omeprazole 20 MG capsule Commonly known as: PRILOSEC Take 1 capsule (20 mg total) by mouth daily.   ONE TOUCH ULTRA MINI w/Device Kit 1 each by Does not apply route 2 (two) times daily.   oxyCODONE 5 MG immediate release tablet Commonly known as: Oxy IR/ROXICODONE Take 5 mg by mouth every 4 (four) hours as needed for severe pain.   traZODone 50 MG tablet Commonly known as: DESYREL Take 0.5-1 tablets (25-50 mg total) by mouth at bedtime as needed for sleep.         Objective:   BP 124/64   Pulse 75   Ht 5\' 5"  (1.651 m)   SpO2 95%   BMI 22.75 kg/m   Wt Readings from Last 3 Encounters:  05/03/23 136 lb 11 oz (62 kg)  04/12/23 135 lb (61.2 kg)  04/02/23 136 lb (61.7 kg)    Physical Exam Vitals and nursing note reviewed.  Constitutional:      General: He is not in acute distress.    Appearance: He is well-developed. He is not diaphoretic.  Eyes:     General: No scleral icterus.    Conjunctiva/sclera: Conjunctivae normal.  Neck:     Thyroid: No thyromegaly.  Cardiovascular:     Rate and Rhythm: Normal rate and regular rhythm.     Heart sounds: Normal heart sounds. No murmur heard. Pulmonary:     Effort: Pulmonary effort is normal. No respiratory distress.     Breath sounds: Normal breath sounds. No wheezing.  Musculoskeletal:        General: Normal range of motion.     Cervical back: Neck supple.  Lymphadenopathy:     Cervical: No cervical adenopathy.  Skin:    General: Skin is warm and dry.     Findings: No rash.  Neurological:     Mental Status: He is alert and oriented to person, place, and time.     Coordination: Coordination normal.  Psychiatric:        Behavior: Behavior normal.       Assessment & Plan:   Problem List Items Addressed This Visit       Endocrine   Non-insulin dependent type 2 diabetes mellitus (HCC)   Relevant Orders   Bayer DCA Hb A1c Waived   CBC with Differential/Platelet   CMP14+EGFR     Other   Normocytic  anemia - Primary   Relevant Orders   Bayer DCA Hb A1c Waived   CBC with Differential/Platelet   CMP14+EGFR    Will check blood work and anemia and A1c today, hemoglobin on the way out.  He is also likely more fatigued not just because of anemia but also because of the pain medicines that he is using and decreased  mobility and decreased appetite.  This all fits going along with his metastatic prostate cancer. Follow up plan: Return in about 3 months (around 08/11/2023), or if symptoms worsen or fail to improve, for Diabetes and anemia recheck.  Counseling provided for all of the vaccine components Orders Placed This Encounter  Procedures   Bayer DCA Hb A1c Waived   CBC with Differential/Platelet   CMP14+EGFR    Arville Care, MD [facility] 05/11/2023, 3:42 PM

## 2023-05-12 LAB — CMP14+EGFR
ALT: 12 IU/L (ref 0–44)
AST: 50 IU/L — ABNORMAL HIGH (ref 0–40)
Albumin/Globulin Ratio: 1.4 (ref 1.2–2.2)
Albumin: 3 g/dL — ABNORMAL LOW (ref 3.8–4.8)
Alkaline Phosphatase: 1423 IU/L (ref 44–121)
BUN/Creatinine Ratio: 18 (ref 10–24)
BUN: 15 mg/dL (ref 8–27)
Bilirubin Total: 1 mg/dL (ref 0.0–1.2)
CO2: 18 mmol/L — ABNORMAL LOW (ref 20–29)
Calcium: 7.9 mg/dL — ABNORMAL LOW (ref 8.6–10.2)
Chloride: 104 mmol/L (ref 96–106)
Creatinine, Ser: 0.84 mg/dL (ref 0.76–1.27)
Globulin, Total: 2.2 g/dL (ref 1.5–4.5)
Glucose: 138 mg/dL — ABNORMAL HIGH (ref 70–99)
Potassium: 4.2 mmol/L (ref 3.5–5.2)
Sodium: 137 mmol/L (ref 134–144)
Total Protein: 5.2 g/dL — ABNORMAL LOW (ref 6.0–8.5)
eGFR: 88 mL/min/{1.73_m2} (ref >59)

## 2023-05-12 LAB — CBC WITH DIFFERENTIAL/PLATELET
Basophils Absolute: 0 10*3/uL (ref 0.0–0.2)
Basos: 0 %
EOS (ABSOLUTE): 0.1 10*3/uL (ref 0.0–0.4)
Eos: 1 %
Hematocrit: 17.1 % — CL (ref 37.5–51.0)
Hemoglobin: 5.5 g/dL — CL (ref 13.0–17.7)
Lymphocytes Absolute: 0.8 10*3/uL (ref 0.7–3.1)
Lymphs: 13 %
MCH: 29.3 pg (ref 26.6–33.0)
MCHC: 32.2 g/dL (ref 31.5–35.7)
MCV: 91 fL (ref 79–97)
Monocytes Absolute: 0.2 10*3/uL (ref 0.1–0.9)
Monocytes: 4 %
NRBC: 9 % — ABNORMAL HIGH (ref 0–0)
Neutrophils Absolute: 4.5 10*3/uL (ref 1.4–7.0)
Neutrophils: 74 %
Platelets: 105 10*3/uL — ABNORMAL LOW (ref 150–450)
RBC: 1.88 x10E6/uL — CL (ref 4.14–5.80)
RDW: 17.7 % — ABNORMAL HIGH (ref 11.6–15.4)
WBC: 6.1 10*3/uL (ref 3.4–10.8)

## 2023-05-12 LAB — IMMATURE CELLS
MYELOCYTES: 3 % — ABNORMAL HIGH (ref 0–0)
Metamyelocytes: 5 % — ABNORMAL HIGH (ref 0–0)

## 2023-05-14 ENCOUNTER — Observation Stay (HOSPITAL_COMMUNITY)
Admission: EM | Admit: 2023-05-14 | Discharge: 2023-05-15 | Disposition: A | Discharge status: Home / Self Care | Payer: Medicare Other | Attending: Internal Medicine | Admitting: Internal Medicine

## 2023-05-14 ENCOUNTER — Encounter (HOSPITAL_COMMUNITY): Payer: Self-pay | Admitting: Emergency Medicine

## 2023-05-14 ENCOUNTER — Other Ambulatory Visit: Payer: Self-pay

## 2023-05-14 ENCOUNTER — Telehealth: Payer: Self-pay | Admitting: Family Medicine

## 2023-05-14 DIAGNOSIS — I10 Essential (primary) hypertension: Secondary | ICD-10-CM | POA: Insufficient documentation

## 2023-05-14 DIAGNOSIS — Z7901 Long term (current) use of anticoagulants: Secondary | ICD-10-CM | POA: Insufficient documentation

## 2023-05-14 DIAGNOSIS — C7951 Secondary malignant neoplasm of bone: Secondary | ICD-10-CM | POA: Diagnosis not present

## 2023-05-14 DIAGNOSIS — Z79899 Other long term (current) drug therapy: Secondary | ICD-10-CM | POA: Diagnosis not present

## 2023-05-14 DIAGNOSIS — D509 Iron deficiency anemia, unspecified: Secondary | ICD-10-CM | POA: Diagnosis present

## 2023-05-14 DIAGNOSIS — Z7984 Long term (current) use of oral hypoglycemic drugs: Secondary | ICD-10-CM | POA: Diagnosis not present

## 2023-05-14 DIAGNOSIS — G893 Neoplasm related pain (acute) (chronic): Secondary | ICD-10-CM | POA: Diagnosis not present

## 2023-05-14 DIAGNOSIS — I480 Paroxysmal atrial fibrillation: Secondary | ICD-10-CM | POA: Insufficient documentation

## 2023-05-14 DIAGNOSIS — R627 Adult failure to thrive: Secondary | ICD-10-CM | POA: Diagnosis not present

## 2023-05-14 DIAGNOSIS — E782 Mixed hyperlipidemia: Secondary | ICD-10-CM | POA: Diagnosis present

## 2023-05-14 DIAGNOSIS — E871 Hypo-osmolality and hyponatremia: Secondary | ICD-10-CM | POA: Diagnosis not present

## 2023-05-14 DIAGNOSIS — Z87891 Personal history of nicotine dependence: Secondary | ICD-10-CM | POA: Insufficient documentation

## 2023-05-14 DIAGNOSIS — C61 Malignant neoplasm of prostate: Secondary | ICD-10-CM | POA: Insufficient documentation

## 2023-05-14 DIAGNOSIS — D649 Anemia, unspecified: Secondary | ICD-10-CM

## 2023-05-14 DIAGNOSIS — D62 Acute posthemorrhagic anemia: Secondary | ICD-10-CM | POA: Diagnosis not present

## 2023-05-14 DIAGNOSIS — E43 Unspecified severe protein-calorie malnutrition: Secondary | ICD-10-CM | POA: Diagnosis not present

## 2023-05-14 DIAGNOSIS — Z86718 Personal history of other venous thrombosis and embolism: Secondary | ICD-10-CM | POA: Diagnosis not present

## 2023-05-14 DIAGNOSIS — E119 Type 2 diabetes mellitus without complications: Secondary | ICD-10-CM | POA: Diagnosis not present

## 2023-05-14 DIAGNOSIS — D696 Thrombocytopenia, unspecified: Secondary | ICD-10-CM | POA: Diagnosis not present

## 2023-05-14 DIAGNOSIS — Z7952 Long term (current) use of systemic steroids: Secondary | ICD-10-CM | POA: Insufficient documentation

## 2023-05-14 DIAGNOSIS — I422 Other hypertrophic cardiomyopathy: Secondary | ICD-10-CM

## 2023-05-14 LAB — BPAM RBC
ISSUE DATE / TIME: 202406031708
Unit Type and Rh: 1700

## 2023-05-14 LAB — URINALYSIS, ROUTINE W REFLEX MICROSCOPIC
Bilirubin Urine: NEGATIVE
Glucose, UA: NEGATIVE mg/dL
Ketones, ur: NEGATIVE mg/dL
Nitrite: NEGATIVE
Protein, ur: 30 mg/dL — AB
Specific Gravity, Urine: 1.016 (ref 1.005–1.030)
pH: 5 (ref 5.0–8.0)

## 2023-05-14 LAB — CBC WITH DIFFERENTIAL/PLATELET
Abs Immature Granulocytes: 0 10*3/uL (ref 0.00–0.07)
Band Neutrophils: 3 %
Basophils Absolute: 0 10*3/uL (ref 0.0–0.1)
Basophils Relative: 0 %
Eosinophils Absolute: 0 10*3/uL (ref 0.0–0.5)
Eosinophils Relative: 0 %
HCT: 18.3 % — ABNORMAL LOW (ref 39.0–52.0)
Hemoglobin: 5.7 g/dL — CL (ref 13.0–17.0)
Lymphocytes Relative: 21 %
Lymphs Abs: 1.1 10*3/uL (ref 0.7–4.0)
MCH: 30.2 pg (ref 26.0–34.0)
MCHC: 31.1 g/dL (ref 30.0–36.0)
MCV: 96.8 fL (ref 80.0–100.0)
Monocytes Absolute: 0.4 10*3/uL (ref 0.1–1.0)
Monocytes Relative: 8 %
Neutro Abs: 3.8 10*3/uL (ref 1.7–7.7)
Neutrophils Relative %: 68 %
Platelets: 95 10*3/uL — ABNORMAL LOW (ref 150–400)
RBC: 1.89 MIL/uL — ABNORMAL LOW (ref 4.22–5.81)
RDW: 19.9 % — ABNORMAL HIGH (ref 11.5–15.5)
WBC: 5.3 10*3/uL (ref 4.0–10.5)
nRBC: 2 /100 WBC — ABNORMAL HIGH
nRBC: 5.8 % — ABNORMAL HIGH (ref 0.0–0.2)

## 2023-05-14 LAB — IRON AND TIBC
Iron: 63 ug/dL (ref 45–182)
Saturation Ratios: 35 % (ref 17.9–39.5)
TIBC: 179 ug/dL — ABNORMAL LOW (ref 250–450)
UIBC: 116 ug/dL

## 2023-05-14 LAB — COMPREHENSIVE METABOLIC PANEL
ALT: 16 U/L (ref 0–44)
AST: 71 U/L — ABNORMAL HIGH (ref 15–41)
Albumin: 2.6 g/dL — ABNORMAL LOW (ref 3.5–5.0)
Alkaline Phosphatase: 1342 U/L — ABNORMAL HIGH (ref 38–126)
Anion gap: 9 (ref 5–15)
BUN: 21 mg/dL (ref 8–23)
CO2: 19 mmol/L — ABNORMAL LOW (ref 22–32)
Calcium: 7.5 mg/dL — ABNORMAL LOW (ref 8.9–10.3)
Chloride: 103 mmol/L (ref 98–111)
Creatinine, Ser: 1.13 mg/dL (ref 0.61–1.24)
GFR, Estimated: >60 mL/min (ref >60)
Glucose, Bld: 207 mg/dL — ABNORMAL HIGH (ref 70–99)
Potassium: 4.1 mmol/L (ref 3.5–5.1)
Sodium: 131 mmol/L — ABNORMAL LOW (ref 135–145)
Total Bilirubin: 1.5 mg/dL — ABNORMAL HIGH (ref 0.3–1.2)
Total Protein: 5.7 g/dL — ABNORMAL LOW (ref 6.5–8.1)

## 2023-05-14 LAB — FERRITIN: Ferritin: 4793 ng/mL — ABNORMAL HIGH (ref 24–336)

## 2023-05-14 LAB — FOLATE: Folate: 5.6 ng/mL — ABNORMAL LOW (ref >5.9)

## 2023-05-14 LAB — TYPE AND SCREEN: Unit division: 0

## 2023-05-14 LAB — TSH: TSH: 1.252 u[IU]/mL (ref 0.350–4.500)

## 2023-05-14 LAB — VITAMIN B12: Vitamin B-12: 879 pg/mL (ref 180–914)

## 2023-05-14 LAB — PREPARE RBC (CROSSMATCH)

## 2023-05-14 LAB — T4, FREE: Free T4: 1.22 ng/dL — ABNORMAL HIGH (ref 0.61–1.12)

## 2023-05-14 MED ORDER — METOPROLOL SUCCINATE ER 50 MG PO TB24
50.0000 mg | ORAL_TABLET | Freq: Every day | ORAL | Status: DC
  Administered 2023-05-15: 50 mg via ORAL
  Filled 2023-05-14: qty 1

## 2023-05-14 MED ORDER — PANTOPRAZOLE SODIUM 40 MG IV SOLR
80.0000 mg | Freq: Once | INTRAVENOUS | Status: AC
  Administered 2023-05-14: 80 mg via INTRAVENOUS
  Filled 2023-05-14: qty 20

## 2023-05-14 MED ORDER — ONDANSETRON HCL 4 MG/2ML IJ SOLN: 4.0000 mg | Freq: Four times a day (QID) | INTRAMUSCULAR | Status: DC | PRN

## 2023-05-14 MED ORDER — MAGNESIUM OXIDE -MG SUPPLEMENT 400 (240 MG) MG PO TABS
400.0000 mg | ORAL_TABLET | Freq: Two times a day (BID) | ORAL | Status: DC
  Administered 2023-05-14 – 2023-05-15 (×2): 400 mg via ORAL
  Filled 2023-05-14 (×2): qty 1

## 2023-05-14 MED ORDER — SODIUM CHLORIDE 0.9 % IV SOLN: INTRAVENOUS | Status: AC

## 2023-05-14 MED ORDER — OXYCODONE HCL 5 MG PO TABS
5.0000 mg | ORAL_TABLET | ORAL | Status: DC | PRN
  Administered 2023-05-14 – 2023-05-15 (×2): 5 mg via ORAL
  Filled 2023-05-14 (×2): qty 1

## 2023-05-14 MED ORDER — TRAZODONE HCL 50 MG PO TABS
25.0000 mg | ORAL_TABLET | Freq: Every evening | ORAL | Status: DC | PRN
  Administered 2023-05-14: 50 mg via ORAL
  Filled 2023-05-14: qty 1

## 2023-05-14 MED ORDER — FENTANYL 12 MCG/HR TD PT72
1.0000 | MEDICATED_PATCH | TRANSDERMAL | Status: DC
  Administered 2023-05-14: 1 via TRANSDERMAL
  Filled 2023-05-14: qty 1

## 2023-05-14 MED ORDER — SODIUM CHLORIDE 0.9% IV SOLUTION: Freq: Once | INTRAVENOUS | Status: AC

## 2023-05-14 MED ORDER — FERROUS SULFATE 325 (65 FE) MG PO TABS
325.0000 mg | ORAL_TABLET | Freq: Every day | ORAL | Status: DC
  Administered 2023-05-15: 325 mg via ORAL
  Filled 2023-05-14: qty 1

## 2023-05-14 MED ORDER — SENNA 8.6 MG PO TABS
2.0000 | ORAL_TABLET | Freq: Every day | ORAL | Status: DC
  Administered 2023-05-14 – 2023-05-15 (×2): 17.2 mg via ORAL
  Filled 2023-05-14 (×2): qty 2

## 2023-05-14 MED ORDER — PANTOPRAZOLE SODIUM 40 MG PO TBEC
40.0000 mg | DELAYED_RELEASE_TABLET | Freq: Every day | ORAL | Status: DC
  Administered 2023-05-15: 40 mg via ORAL
  Filled 2023-05-14: qty 1

## 2023-05-14 MED ORDER — ONDANSETRON HCL 4 MG PO TABS: 4.0000 mg | ORAL_TABLET | Freq: Four times a day (QID) | ORAL | Status: DC | PRN

## 2023-05-14 MED ORDER — OXYCODONE-ACETAMINOPHEN 5-325 MG PO TABS
1.0000 | ORAL_TABLET | Freq: Once | ORAL | Status: AC
  Administered 2023-05-14: 1 via ORAL
  Filled 2023-05-14: qty 1

## 2023-05-14 MED ORDER — CALCIUM CARBONATE 1250 (500 CA) MG PO TABS
500.0000 mg | ORAL_TABLET | Freq: Two times a day (BID) | ORAL | Status: DC
  Administered 2023-05-14 – 2023-05-15 (×3): 1250 mg via ORAL
  Filled 2023-05-14 (×3): qty 1

## 2023-05-14 MED ORDER — ACETAMINOPHEN 325 MG PO TABS: 650.0000 mg | ORAL_TABLET | Freq: Four times a day (QID) | ORAL | Status: DC | PRN

## 2023-05-14 MED ORDER — ACETAMINOPHEN 650 MG RE SUPP: 650.0000 mg | Freq: Four times a day (QID) | RECTAL | Status: DC | PRN

## 2023-05-14 MED ORDER — POLYETHYLENE GLYCOL 3350 17 G PO PACK
17.0000 g | PACK | Freq: Every day | ORAL | Status: DC
  Administered 2023-05-14: 17 g via ORAL
  Filled 2023-05-14 (×2): qty 1

## 2023-05-14 NOTE — Progress Notes (Signed)
Patient tolerating blood transfusion without difficulty  05/14/23 1732  Vitals  Vital Signs Type (Include Temp, Pulse, RR, and B/P) 15 min. post blood start  Temp 98.8 F (37.1 C)  Temp Source Oral  Pulse Rate 70  Resp 20  BP (!) 121/52  Oxygen Therapy  SpO2 100 %  O2 Device Room Air

## 2023-05-14 NOTE — H&P (Signed)
History and Physical    Patient: Alan [name]. ZOX:[id number] DOB: Jul 24, 1941 DOA: 05/14/2023 DOS: the patient was seen and examined on 05/14/2023 PCP: Dettinger, Elige Radon, MD  Patient coming from: Home  Chief Complaint:  Chief Complaint  Patient presents with   Abnormal Lab   HPI: Alan [name]. is a 82 year old male with a history of metastatic prostate cancer to bone, hypertrophic cardiomyopathy, diabetes mellitus type 2, hypertension, hyperlipidemia, paroxysmal atrial fibrillation, right lower extremity DVT presenting with abnormal blood work showing low hemoglobin from his PCP.  The patient was contacted by his PCP-go to the emergency department for blood transfusion when he was noted his hemoglobin was 5.5.  The patient himself has been complaining of increasing fatigue and some dyspnea on exertion.  The patient's spouse at the bedside supplements history.  She has noted a continued functional decline over the past 2 to 3 months.  At baseline, the patient is able to make transfers but is essentially wheelchair-bound.  He has not had any fevers, chills, chest pain, worsening shortness of breath, nausea, vomiting or diarrhea.  He has had some dark stools without any hematochezia.  He denies any hematemesis.  He denies hematuria. Notably, the patient was recently mated to the hospital from 03/01/2023 to 03/04/2023 secondary to supratherapeutic INR of 8.4.  He was given vitamin K and FFP.  His hemoglobin was noted to be 6.0 during that admission.  He was transfused 2 units PRBC.  He was discharged home with apixaban. Since that discharge, the patient has follow-up with his PCP.  Since he has been enrolled with [facility] hospice and given that he continues to have blood loss anemia, his apixaban was discontinued altogether on 03/19/2023. In the ED, the patient was afebrile hemodynamically stable with oxygen saturation 99% on room air.  WBC 5.3, hemoglobin 5.7, platelets 95,000.  Sodium 131, potassium  4.1, bicarbonate 19, serum creatinine 1.13.  UA showed 6-10 WBC, 21-50 RBC.  2 units PRBC were ordered.  Review of Systems: As mentioned in the history of present illness. All other systems reviewed and are negative. 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   found during hospital stay with pancreatitis,  started coumadin;   last Abd CT in epic 07-29-2020  chronic thrombus portal venous   GERD (gastroesophageal reflux disease)    Heart murmur    History of acute pancreatitis    2016   necrotizing pancreatitis   History of adenomatous polyp of colon    History of DVT of lower extremity    History of GI bleed 08/2019   upper gi bleed due to duodenal ulcer   History of gout yrs ago   Hypertension    followed by pcp   Hypertrophic cardiomyopathy (HCC)    followed by cardiology   Internal hemorrhoids    Mixed hyperlipidemia    Normocytic anemia    PAF (paroxysmal atrial fibrillation) (HCC) 04/2016   cardiologist-- dr h. Katrinka Blazing--  event monitor 07-04-2017 epic, NSR/ PAflutter with occasional RVR/  PACs/ PVCs ;  echo 05-16-2017 epic,  moderate LVH with severe asymptomic septal hypertrophy, G1DD, EF 60-65%,  mild AR, mild LAE   Pancreatic pseudocyst    Peyronie's disease  Prostate cancer [facility]) urologist-- dr wrenn/ oncologist-- dr Kathrynn Running   first dx 08/ 2009 Gleason 3+3 active survelliance;  until bx 07/ 2021  Stage T2b, Gleason 4+3   Right inguinal hernia    Type 2 diabetes mellitus (HCC)    followed by pcp---  (11-03-2020 per pt currently not checking blood sugar due to glucose monitor broken)   Wears glasses    Wears partial dentures    upper and lower   Past Surgical History:  Procedure Laterality Date   BIOPSY  02/09/2022   Procedure:  BIOPSY;  Surgeon: Rachael Fee, MD;  Location: [facility];  Service: Endoscopy;;   CHOLECYSTECTOMY N/A 11/26/2015   Procedure: LAPAROSCOPIC CHOLECYSTECTOMY;  Surgeon: Franky Macho, MD;  Location: [facility];  Service: General;  Laterality: N/A;   COLONOSCOPY  last one 09-06-2020  dr stark   CYSTOSCOPY N/A 11/09/2020   Procedure: Derinda Late;  Surgeon: Bjorn Pippin, MD;  Location: [facility];  Service: Urology;  Laterality: N/A;   ESOPHAGOGASTRODUODENOSCOPY  10-06-2019  @[facility]    ESOPHAGOGASTRODUODENOSCOPY N/A 02/09/2022   Procedure: ESOPHAGOGASTRODUODENOSCOPY (EGD);  Surgeon: Rachael Fee, MD;  Location: [facility];  Service: Endoscopy;  Laterality: N/A;   EUS N/A 02/09/2022   Procedure: UPPER ENDOSCOPIC ULTRASOUND (EUS) RADIAL;  Surgeon: Rachael Fee, MD;  Location: [facility];  Service: Endoscopy;  Laterality: N/A;   FLEXIBLE BRONCHOSCOPY Bilateral 04/14/2016   Procedure: FLEXIBLE BRONCHOSCOPY;  Surgeon: Kari Baars, MD;  Location: [facility];  Service: Cardiopulmonary;  Laterality: Bilateral;   HEMORRHOID SURGERY  yrs ago   RADIOACTIVE SEED IMPLANT N/A 11/09/2020   Procedure: RADIOACTIVE SEED IMPLANT/BRACHYTHERAPY IMPLANT;  Surgeon: Bjorn Pippin, MD;  Location: [facility];  Service: Urology;  Laterality: N/A;   SPACE OAR INSTILLATION N/A 11/09/2020   Procedure: SPACE OAR INSTILLATION;  Surgeon: Bjorn Pippin, MD;  Location: [facility];  Service: Urology;  Laterality: N/A;   UMBILICAL HERNIA REPAIR N/A 11/26/2015   Procedure: UMBILICAL HERNIORRHAPHY;  Surgeon: Franky Macho, MD;  Location: [facility];  Service: General;  Laterality: N/A;   Social History:  reports that he quit smoking about 36 years ago. His smoking use included cigarettes. He has never used smokeless tobacco. He reports that he does not drink alcohol and does not use drugs.  Allergies  Allergen Reactions   Allopurinol Other (See Comments)    Significantly  decreased WBC's   Colchicine Other (See Comments)    Significantly decreased WBC's    Family History  Problem Relation Age of Onset   Stroke Mother    Hypertension Mother    Alzheimer's disease Mother    Hypertension Father    Brain cancer Sister    Breast cancer Sister    Hypertension Sister    Deep vein thrombosis Brother    Hypertension Brother    Colon cancer Neg Hx    Pancreatic disease Neg Hx    Esophageal cancer Neg Hx    Rectal cancer Neg Hx    Stomach cancer Neg Hx     Prior to Admission medications   Medication Sig Start Date End Date Taking? Authorizing Provider  acetaminophen (TYLENOL) 325 MG tablet Take 2 tablets (650 mg total) by mouth every 6 (six) hours as needed for mild pain, fever or headache (or Fever >/= 101). 03/04/23   Emokpae, Courage, MD  Blood Glucose Monitoring Suppl (ONE TOUCH ULTRA MINI) w/Device KIT 1 each by Does not apply route 2 (two) times daily. 01/14/21   Dettinger,  Elige Radon, MD  Blood Pressure KIT 1 each by Does not apply route daily. 08/16/22   Dettinger, Elige Radon, MD  calcium carbonate (OSCAL) 1500 (600 Ca) MG TABS tablet Take by mouth 2 (two) times daily with a meal.    [provider]  diclofenac Sodium (VOLTAREN) 1 % GEL Apply 1 application topically 2 (two) times daily as needed (pain).    [provider]  fentaNYL (DURAGESIC) 12 MCG/HR Place 1 patch onto the skin every 3 (three) days.    [provider]  ferrous sulfate (SV IRON) 325 (65 FE) MG tablet Take 1 tablet (325 mg total) by mouth daily with breakfast. 04/02/23   Dettinger, Elige Radon, MD  fluticasone (FLONASE) 50 MCG/ACT nasal spray Place 1 spray into both nostrils 2 (two) times daily as needed for allergies or rhinitis. 11/05/15   Dettinger, Elige Radon, MD  glucose blood (ONE TOUCH ULTRA TEST) test strip Use to check BG once daily.  Dx:  Type 2 DM controlled E11.9 01/12/23   Dettinger, Elige Radon, MD  magnesium oxide (MAG-OX) 400 (240 Mg) MG tablet Take 1 tablet (400  mg total) by mouth in the morning, at noon, and at bedtime. 11/29/22   Doreatha Massed, MD  metFORMIN (GLUCOPHAGE) 1000 MG tablet Take 1 tablet (1,000 mg total) by mouth 2 (two) times daily with a meal. 05/29/22   Dettinger, Elige Radon, MD  metoprolol succinate (TOPROL-XL) 100 MG 24 hr tablet Take 1 tablet (100 mg total) by mouth daily. Take with or immediately following a meal. 01/12/23   Dettinger, Elige Radon, MD  omeprazole (PRILOSEC) 20 MG capsule Take 1 capsule (20 mg total) by mouth daily. 01/12/23   Dettinger, Elige Radon, MD  oxyCODONE (OXY IR/ROXICODONE) 5 MG immediate release tablet Take 5 mg by mouth every 4 (four) hours as needed for severe pain.    [provider]  traZODone (DESYREL) 50 MG tablet Take 0.5-1 tablets (25-50 mg total) by mouth at bedtime as needed for sleep. 03/19/23   Dettinger, Elige Radon, MD    Physical Exam: Vitals:   05/14/23 1442 05/14/23 1443 05/14/23 1500 05/14/23 1515  BP: (!) 121/50 (!) 121/50 121/85 (!) 120/50  Pulse: 76 75 70 72  Resp: (!) 23 18 16 18   Temp: 98.7 F (37.1 C)     TempSrc: Oral     SpO2: 99% 99% 97% 95%  Weight:      Height:       GENERAL:  A&O x 2, NAD, well developed, cooperative, follows commands HEENT: Fate/AT, No thrush, No icterus, No oral ulcers Neck:  No neck mass, No meningismus, soft, supple CV: RRR, no S3, no S4, no rub, no JVD Lungs:  bibasilar crackles. No wheeze Abd: soft/NT +BS, nondistended Ext: No edema, no lymphangitis, no cyanosis, no rashes Neuro:  CN II-XII intact, strength 4/5 in RUE, RLE, strength 4/5 LUE, LLE; sensation intact bilateral; no dysmetria; babinski equivocal  Data Reviewed: Data reviewed above in history  Assessment and Plan: Symptomatic anemia/acute blood loss anemia -2 units PRBC transfused -The patient and her spouse are not interested in any aggressive care or endoscopy at this time -Check iron and ferritin -Continue PPI  Failure to thrive -Nutrition consult -B12 -Folic  acid -TSH  Metastatic prostate cancer to bone -CT abdomen and pelvis on 01/11/2023 shows progression of his disease.   -Saw Dr. 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 Verdis Prime -Supportive care at this point -Continue metoprolol succinate  Essential hypertension -Holding amlodipine -Continue metoprolol  Thrombocytopenia -Check B12 and folate as above    Advance Care Planning: DNR--confirmed with spouse  Consults: palliative  Family Communication: spouse updated 6/3  Severity of Illness: The appropriate patient status for this patient is OBSERVATION. Observation status is judged to be reasonable and necessary in order to provide the required intensity of service to ensure the patient's safety. The patient's presenting symptoms, physical exam findings, and initial radiographic and laboratory data in the context of their medical condition is felt to place them at decreased risk for further clinical deterioration. Furthermore, it is anticipated that the patient will be medically stable for  discharge from the hospital within 2 midnights of admission.   Author: Catarina Hartshorn, MD 05/14/2023 3:39 PM  For on call review [email or web address].

## 2023-05-14 NOTE — Hospital Course (Signed)
82 year old male with a history of metastatic prostate cancer to bone, hypertrophic cardiomyopathy, diabetes mellitus type 2, hypertension, hyperlipidemia, paroxysmal atrial fibrillation, right lower extremity DVT presenting with abnormal blood work showing low hemoglobin from his PCP.  The patient was contacted by his PCP-go to the emergency department for blood transfusion when he was noted his hemoglobin was 5.5.  The patient himself has been complaining of increasing fatigue and some dyspnea on exertion.  The patient's spouse at the bedside supplements history.  She has noted a continued functional decline over the past 2 to 3 months.  At baseline, the patient is able to make transfers but is essentially wheelchair-bound.  He has not had any fevers, chills, chest pain, worsening shortness of breath, nausea, vomiting or diarrhea.  He has had some dark stools without any hematochezia.  He denies any hematemesis.  He denies hematuria. Notably, the patient was recently mated to the hospital from 03/01/2023 to 03/04/2023 secondary to supratherapeutic INR of 8.4.  He was given vitamin K and FFP.  His hemoglobin was noted to be 6.0 during that admission.  He was transfused 2 units PRBC.  He was discharged home with apixaban. Since that discharge, the patient has follow-up with his PCP.  Since he has been enrolled with [facility] hospice and given that he continues to have blood loss anemia, his apixaban was discontinued altogether on 03/19/2023. In the ED, the patient was afebrile hemodynamically stable with oxygen saturation 99% on room air.  WBC 5.3, hemoglobin 5.7, platelets 95,000.  Sodium 131, potassium 4.1, bicarbonate 19, serum creatinine 1.13.  UA showed 6-10 WBC, 21-50 RBC.  2 units PRBC were ordered.

## 2023-05-14 NOTE — Telephone Encounter (Signed)
Call received on May 12, 2023, 10:29 AM. Nurse calling to report critical lab. Hb for pt is 5.5. I asked her to call pt and send him to the E.D. However, she called back 4 minutes later (10:33) stating she had tried several times but she was getting a message that the phone number was blocked for incoming calls. I agreed to call back myself.   I attempted to call 2 numbers from his demographics. One listed as home, the other as mobile. I got the same message the nurse had reported. "Not accepting incoming calls at this time." I attempted each number twice and heard the same message each time.   I then further reviewed the patient's chart in consideration of contacting [employer]. To go to the home to inform the patient of his critical lab and send him to the E.D. However, the review showed that he had chronic anemia with multiple readings including 8.3, 7.8,7.3, 7.2.   As a result of the chart review, I felt that he had been monitored closely, and would not clearly benefit from the appearance of law enforcement at his door. My decision was instead to pursue the matter with his PCP as soon as the office opened Monday.   Mechele Claude, MD

## 2023-05-14 NOTE — ED Triage Notes (Addendum)
Pt sent by PCP for blood transfusion due to symptomatic anemia. Pt is stage IV metastatic prostate cancer pt. Reports extreme fatigue, body aches, dizziness when standing. Pt appears pale in triage. Pt has a fentanyl patch in place and takes oxycodone.

## 2023-05-14 NOTE — ED Provider Notes (Signed)
[place] [facility] Provider Note   CSN: [id number] Arrival date & time: 05/14/23  1218     History  Chief Complaint  Patient presents with   Abnormal Lab    Alan Cerda. is a 82 y.o. male.  PMH of metastatic prostate cancer, A-fib, GERD, diabetes, hyperlipidemia.  Presents the ER today after being called by his primary care doctor's office and told his hemoglobin was low.  Feel that it is has been pale and low energy.  Patient was a started palliative care, is no longer undergoing any treatments for his prostate cancer but is on fentanyl patch and oxycodone for pain.  They note that he does have chronically very dark stools, has history of GI bleed though they deny history of ulcers or any known history of liver disease/varices.  Blood transfusion on 03/21/2023 for hemoglobin of 6.8.  Patient and family states they are not interested in any invasive procedures such as endoscopy but would like the blood transfusion   Abnormal Lab      Home Medications Prior to Admission medications   Medication Sig Start Date End Date Taking? Authorizing Provider  acetaminophen (TYLENOL) 325 MG tablet Take 2 tablets (650 mg total) by mouth every 6 (six) hours as needed for mild pain, fever or headache (or Fever >/= 101). 03/04/23   Emokpae, Courage, MD  Blood Glucose Monitoring Suppl (ONE TOUCH ULTRA MINI) w/Device KIT 1 each by Does not apply route 2 (two) times daily. 01/14/21   Dettinger, Elige Radon, MD  Blood Pressure KIT 1 each by Does not apply route daily. 08/16/22   Dettinger, Elige Radon, MD  calcium carbonate (OSCAL) 1500 (600 Ca) MG TABS tablet Take by mouth 2 (two) times daily with a meal.    [provider]  diclofenac Sodium (VOLTAREN) 1 % GEL Apply 1 application topically 2 (two) times daily as needed (pain).    [provider]  fentaNYL (DURAGESIC) 12 MCG/HR Place 1 patch onto the skin every 3 (three) days.    [provider]   ferrous sulfate (SV IRON) 325 (65 FE) MG tablet Take 1 tablet (325 mg total) by mouth daily with breakfast. 04/02/23   Dettinger, Elige Radon, MD  fluticasone (FLONASE) 50 MCG/ACT nasal spray Place 1 spray into both nostrils 2 (two) times daily as needed for allergies or rhinitis. 11/05/15   Dettinger, Elige Radon, MD  glucose blood (ONE TOUCH ULTRA TEST) test strip Use to check BG once daily.  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 11/29/22   Doreatha Massed, MD  metFORMIN (GLUCOPHAGE) 1000 MG tablet Take 1 tablet (1,000 mg total) by mouth 2 (two) times daily with a meal. 05/29/22   Dettinger, Elige Radon, MD  metoprolol succinate (TOPROL-XL) 100 MG 24 hr tablet Take 1 tablet (100 mg total) by mouth daily. Take with or immediately following a meal. 01/12/23   Dettinger, Elige Radon, MD  omeprazole (PRILOSEC) 20 MG capsule Take 1 capsule (20 mg total) by mouth daily. 01/12/23   Dettinger, Elige Radon, MD  oxyCODONE (OXY IR/ROXICODONE) 5 MG immediate release tablet Take 5 mg by mouth every 4 (four) hours as needed for severe pain.    [provider]  traZODone (DESYREL) 50 MG tablet Take 0.5-1 tablets (25-50 mg total) by  mouth at bedtime as needed for sleep. 03/19/23   Dettinger, Elige Radon, MD      Allergies    Allopurinol and Colchicine    Review of Systems   Review of Systems  Physical Exam Updated Vital Signs BP 121/85   Pulse 70   Temp 98.7 F (37.1 C) (Oral)   Resp 16   Ht 5\' 5"  (1.651 m)   Wt 61.2 kg   SpO2 97%   BMI 22.47 kg/m  Physical Exam Vitals and nursing note reviewed.  Constitutional:      General: He is awake. He is not in acute distress.    Appearance: He is well-developed.  HENT:     Head: Normocephalic and atraumatic.  Eyes:     Comments: Palpebral conjunctival pale  Cardiovascular:     Rate and Rhythm: Normal rate  and regular rhythm.     Heart sounds: No murmur heard. Pulmonary:     Effort: Pulmonary effort is normal. No respiratory distress.     Breath sounds: Normal breath sounds.  Abdominal:     Palpations: Abdomen is soft.     Tenderness: There is no abdominal tenderness. There is no guarding or rebound.  Musculoskeletal:        General: No swelling.     Cervical back: Neck supple.  Skin:    General: Skin is warm and dry.     Capillary Refill: Capillary refill takes less than 2 seconds.     Coloration: Skin is pale.  Psychiatric:        Mood and Affect: Mood normal.     ED Results / Procedures / Treatments   Labs (all labs ordered are listed, but only abnormal results are displayed) Labs Reviewed  CBC WITH DIFFERENTIAL/PLATELET - Abnormal; Notable for the following components:      Result Value   RBC 1.89 (*)    Hemoglobin 5.7 (*)    HCT 18.3 (*)    RDW 19.9 (*)    Platelets 95 (*)    nRBC 5.8 (*)    nRBC 2 (*)    All other components within normal limits  COMPREHENSIVE METABOLIC PANEL - Abnormal; Notable for the following components:   Sodium 131 (*)    CO2 19 (*)    Glucose, Bld 207 (*)    Calcium 7.5 (*)    Total Protein 5.7 (*)    Albumin 2.6 (*)    AST 71 (*)    Alkaline Phosphatase 1,342 (*)    Total Bilirubin 1.5 (*)    All other components within normal limits  URINALYSIS, ROUTINE W REFLEX MICROSCOPIC  TYPE AND SCREEN  PREPARE RBC (CROSSMATCH)    EKG None  Radiology No results found.  Procedures .Critical Care  Performed by: Ma Rings, PA-C Authorized by: Ma Rings, PA-C   Critical care provider statement:    Critical care time (minutes):  35   Critical care was time spent personally by me on the following activities:  Development of treatment plan with patient or surrogate, discussions with consultants, evaluation of patient's response to treatment, examination of patient, ordering and review of laboratory studies, ordering and review  of radiographic studies, ordering and performing treatments and interventions, pulse oximetry, re-evaluation of patient's condition, review of old charts and obtaining history from patient or surrogate   Care discussed with: admitting provider       Medications Ordered in ED Medications  0.9 %  sodium chloride infusion (Manually program via Guardrails IV  Fluids) (has no administration in time range)  pantoprazole (PROTONIX) injection 80 mg (80 mg Intravenous Given 05/14/23 1424)  oxyCODONE-acetaminophen (PERCOCET/ROXICET) 5-325 MG per tablet 1 tablet (1 tablet Oral Given 05/14/23 1445)    ED Course/ Medical Decision Making/ A&P                             Medical Decision Making This patient presents to the ED for concern of fatigue, low hemoglobin on outpatient labs, this involves an extensive number of treatment options, and is a complaint that carries with it a high risk of complications and morbidity.  The differential diagnosis includes anemia, acute blood loss anemia, metastatic cancer, other   Co morbidities that complicate the patient evaluation :   Metastatic prostate cancer on palliative care   Additional history obtained:  Additional history obtained from EMR External records from outside source obtained and reviewed including prior PCP notes, ED notes   Lab Tests:  I Ordered, and personally interpreted labs.  The pertinent results include: CBC shows hemoglobin of 5.7, hematocrit of 18.3, platelets decreased at 95, have been trending down CMP shows mild hyponatremia, slightly decreased CO2 19, AST elevated at 71, alk phos significantly elevated at 1342 which has been trending up on labs recently, bilirubin 1.5    Cardiac Monitoring: / EKG:  The patient was maintained on a cardiac monitor.  I personally viewed and interpreted the cardiac monitored which showed an underlying rhythm of:   Consultations Obtained:  I requested consultation with the hospitalist Dr. Arbutus Leas,   and discussed lab and imaging findings as well as pertinent plan will be down to see the patient in the ED for admission   Problem List / ED Course / Critical interventions / Medication management  Patient here for symptomatic anemia, very pale having generalized weakness and fatigue, has had to have blood transfusion in the past, family states chronic dark stools, they do not want any instrumentation, discussed we can forego rectal exam as they do not want any further workup as to the cause of bleeding, only treatment.  This would be their preference.  She is on palliative care to control his pain, has been taken off of his warfarin for his atrial fibrillation due to anemia and concern for blood loss.  They deny any increase in stools, in fact patient has been constipated recently.  He has no abdominal pain, no hematemesis, no rectal bleeding has been noted.  Packed blood cells ordered, discussed risk and benefits of transfusion with patient and family.  They would like him to have transfusion he is tolerated this well in the past.  They are willing to have hospitalizations like to monitor his blood levels, make sure they are not dropping further. I ordered medication including protonix for full GI bleeding, PRBCs for symptomatic anemia Reevaluation of the patient after these medicines showed that the patient stayed the same I have reviewed the patients home medicines and have made adjustments as needed   Social Determinants of Health:  Is on palliative care      Amount and/or Complexity of Data Reviewed Labs: ordered.  Risk Prescription drug management. Decision regarding hospitalization.           Final Clinical Impression(s) / ED Diagnoses Final diagnoses:  Symptomatic anemia    Rx / DC Orders ED Discharge Orders     None         Ma Rings, PA-C 05/14/23  1517    Bethann Berkshire, MD 05/17/23 1119

## 2023-05-15 ENCOUNTER — Encounter (HOSPITAL_COMMUNITY): Payer: Self-pay | Admitting: Internal Medicine

## 2023-05-15 DIAGNOSIS — I422 Other hypertrophic cardiomyopathy: Secondary | ICD-10-CM | POA: Diagnosis not present

## 2023-05-15 DIAGNOSIS — C7951 Secondary malignant neoplasm of bone: Secondary | ICD-10-CM | POA: Diagnosis not present

## 2023-05-15 DIAGNOSIS — D62 Acute posthemorrhagic anemia: Secondary | ICD-10-CM | POA: Diagnosis not present

## 2023-05-15 DIAGNOSIS — Z7189 Other specified counseling: Secondary | ICD-10-CM

## 2023-05-15 DIAGNOSIS — Z515 Encounter for palliative care: Secondary | ICD-10-CM

## 2023-05-15 DIAGNOSIS — C61 Malignant neoplasm of prostate: Secondary | ICD-10-CM

## 2023-05-15 DIAGNOSIS — E43 Unspecified severe protein-calorie malnutrition: Secondary | ICD-10-CM

## 2023-05-15 LAB — CBC
HCT: 20.5 % — ABNORMAL LOW (ref 39.0–52.0)
HCT: 31.9 % — ABNORMAL LOW (ref 39.0–52.0)
Hemoglobin: 6.8 g/dL — CL (ref 13.0–17.0)
Hemoglobin: 10.7 g/dL — ABNORMAL LOW (ref 13.0–17.0)
MCH: 29.4 pg (ref 26.0–34.0)
MCH: 29.3 pg (ref 26.0–34.0)
MCHC: 33.2 g/dL (ref 30.0–36.0)
MCHC: 33.5 g/dL (ref 30.0–36.0)
MCV: 88.7 fL (ref 80.0–100.0)
MCV: 87.4 fL (ref 80.0–100.0)
Platelets: 66 10*3/uL — ABNORMAL LOW (ref 150–400)
Platelets: 65 10*3/uL — ABNORMAL LOW (ref 150–400)
RBC: 2.31 MIL/uL — ABNORMAL LOW (ref 4.22–5.81)
RBC: 3.65 MIL/uL — ABNORMAL LOW (ref 4.22–5.81)
RDW: 20.2 % — ABNORMAL HIGH (ref 11.5–15.5)
RDW: 19.1 % — ABNORMAL HIGH (ref 11.5–15.5)
WBC: 4.1 10*3/uL (ref 4.0–10.5)
WBC: 5.3 10*3/uL (ref 4.0–10.5)
nRBC: 6.4 % — ABNORMAL HIGH (ref 0.0–0.2)
nRBC: 4.5 % — ABNORMAL HIGH (ref 0.0–0.2)

## 2023-05-15 LAB — TYPE AND SCREEN
Unit division: 0
Unit division: 0

## 2023-05-15 LAB — HEMOGLOBIN AND HEMATOCRIT, BLOOD
HCT: 21.1 % — ABNORMAL LOW (ref 39.0–52.0)
Hemoglobin: 6.8 g/dL — CL (ref 13.0–17.0)

## 2023-05-15 LAB — BPAM RBC
Blood Product Expiration Date: 202406302359
Blood Product Expiration Date: 202407032359
ISSUE DATE / TIME: 202406041000
ISSUE DATE / TIME: 202406041230
Unit Type and Rh: 1700
Unit Type and Rh: 5100
Unit Type and Rh: 5100

## 2023-05-15 LAB — BASIC METABOLIC PANEL
Anion gap: 7 (ref 5–15)
BUN: 17 mg/dL (ref 8–23)
CO2: 21 mmol/L — ABNORMAL LOW (ref 22–32)
Calcium: 7.4 mg/dL — ABNORMAL LOW (ref 8.9–10.3)
Chloride: 103 mmol/L (ref 98–111)
Creatinine, Ser: 0.91 mg/dL (ref 0.61–1.24)
GFR, Estimated: >60 mL/min (ref >60)
Glucose, Bld: 131 mg/dL — ABNORMAL HIGH (ref 70–99)
Potassium: 3.6 mmol/L (ref 3.5–5.1)
Sodium: 131 mmol/L — ABNORMAL LOW (ref 135–145)

## 2023-05-15 LAB — PREPARE RBC (CROSSMATCH)

## 2023-05-15 LAB — HEMOGLOBIN A1C
Hgb A1c MFr Bld: 6.8 % — ABNORMAL HIGH (ref 4.8–5.6)
Mean Plasma Glucose: 148 mg/dL

## 2023-05-15 MED ORDER — SODIUM CHLORIDE 0.9% IV SOLUTION: Freq: Once | INTRAVENOUS | Status: DC

## 2023-05-15 MED ORDER — FOLIC ACID 1 MG PO TABS: 1.0000 mg | ORAL_TABLET | Freq: Every day | ORAL | Status: DC

## 2023-05-15 MED ORDER — FOLIC ACID 1 MG PO TABS
1.0000 mg | ORAL_TABLET | Freq: Every day | ORAL | Status: DC
  Administered 2023-05-15: 1 mg via ORAL
  Filled 2023-05-15: qty 1

## 2023-05-15 MED ORDER — CHLORHEXIDINE GLUCONATE CLOTH 2 % EX PADS
6.0000 | MEDICATED_PAD | Freq: Every day | CUTANEOUS | Status: DC
  Administered 2023-05-15: 6 via TOPICAL

## 2023-05-15 NOTE — TOC CM/SW Note (Addendum)
Transition of Care [facility]) - Inpatient Brief Assessment   Patient Details  Name: Alan [name]. MRN: [id number] Date of Birth: [birth year]  Transition of Care [facility]) CM/SW Contact:    Elliot Gault, LCSW Phone Number: 05/15/2023, 4:19 PM   Clinical Narrative:  Pt from home with wife. He is active with [facility] at home. Plan is for return home with hospice at dc. TOC will be available if needs arise.  Transition of Care Asessment: Insurance and Status: Insurance coverage has been reviewed Patient has primary care physician: Yes Home environment has been reviewed: from home with wife Prior level of function:: min assist Prior/Current Home Services: Current home services ([facility]) Social Determinants of Health Reivew: SDOH reviewed no interventions necessary Readmission risk has been reviewed: Yes Transition of care needs: no transition of care needs at this time

## 2023-05-15 NOTE — Progress Notes (Signed)
Hgb 10.7 after 2 units today.  Wife called and will be here to give ride home.  To be discharged with foley and to continue with hospice care at home.  Urinary leg bag applied and will give foley bag for home use.  IV removed.

## 2023-05-15 NOTE — Care Management Obs Status (Signed)
MEDICARE OBSERVATION STATUS NOTIFICATION   Patient Details  Name: Alan [name]. MRN: [id number] Date of Birth: 1941-11-09   Medicare Observation Status Notification Given:  Yes    Elliot Gault, LCSW 05/15/2023, 4:23 PM

## 2023-05-15 NOTE — Progress Notes (Signed)
Critical hemoglobin 6.8, nurse paged Dr. Thomes Dinning, awaiting response.

## 2023-05-15 NOTE — Consult Note (Signed)
Consultation Note Date: 05/15/2023   Patient Name: Alan [name].  DOB: November 08, 1941  MRN: [id number]  Age / Sex: 82 y.o., male  PCP: Dettinger, Elige Radon, MD Referring Physician: Catarina Hartshorn, MD  Reason for Consultation: Establishing goals of care  HPI/Patient Profile: 82 y.o. male  with past medical history of metastatic prostate cancer to bone, hypertrophic cardiomyopathy, DM2, HTN/HLD, paroxysmal A-fib, R LE DVT, history of necrotizing pancreatitis 2016, follows with Dr. Ellin Saba last seen 02/21/2023 who recommended hospice at that time admitted on 05/14/2023 with symptomatic anemia/acute blood loss anemia, failure to thrive, metastatic prostate cancer to bone.   Clinical Assessment and Goals of Care: I have reviewed medical records including EPIC notes, labs and imaging, received report from RN, assessed the patient.  Alan [name] is resting quietly in bed.  He appears chronically ill and quite frail.  He is resting comfortably, but wakes easily when I call his name.  He is alert and oriented, able to make his needs known.  There is no family at bedside at this time.  We meet at the bedside to discuss diagnosis prognosis, GOC, EOL wishes, disposition and options.  I introduced Palliative Medicine as specialized medical care for people living with serious illness. It focuses on providing relief from the symptoms and stress of a serious illness. The goal is to improve quality of life for both the patient and the family.  We discussed a brief life review of the patient.  Alan [name] tells me that he and his wife have been married for approximately 53 years.  They share 3 children Liborio Nixon, and Floyd.  He tells me that he and his wife have been managing IADLs together until the last few months.  He tells me that he has help with bathing from his wife.  We then focused on their current illness.  We talk about his acute  health concerns and administration of blood products for low hemoglobin.  We talk about his last visit with oncologist 02/21/2023 where hospice care was recommended.  The natural disease trajectory and expectations at EOL were discussed.  I attempted to elicit values and goals of care important to the patient.  The difference between aggressive medical intervention and comfort care was considered in light of the patient's goals of care.  I share that only he can know "when enough is enough".  He nods in agreement.  Advanced directives, concepts specific to code status, artifical feeding and hydration, and rehospitalization were considered and discussed.  DNR verified with Alan [name].  He tells me that his wife can abide his wish.  Hospice and Palliative Care services outpatient were discussed.  Hospice services were recommended by his trusted oncologist, Dr. Ellin Saba, on 02/21/2023.  Referral made by oncology team.  Alan [name] tells me that he is indeed active with [facility] [place] [facility].  He tells me that he has a registered nurse visit every week or 2.  I encouraged him to build a trusting relationship with his  nurse.  He tells me that he does not have aid services at this time.  Discussed the importance of continued conversation with family and the medical providers regarding overall plan of care and treatment options, ensuring decisions are within the context of the patient's values and GOCs.  Questions and concerns were addressed.  The patient was encouraged to call with questions or concerns.  PMT will continue to support holistically.  Conference with attending, bedside nursing staff, transition of care team related to patient condition, needs, goals of care, disposition.   HCPOA  NEXT OF KIN -Alan [name] names his wife, Jaquane Pensinger, as his healthcare surrogate.  He shares that he and his wife share 3 children Roe Coombs, [place], and [place].    SUMMARY OF  RECOMMENDATIONS   At this point continue to treat the treatable but no CPR or intubation Home with the benefits of hospice services already in place Considering do not rehospitalize   Code Status/Advance Care Planning: DNR -verified with patient.  States his wife can abide his needs.  Symptom Management:  Per hospitalist, no additional needs at this time.  Palliative Prophylaxis:  Frequent Pain Assessment and Oral Care  Additional Recommendations (Limitations, Scope, Preferences): At this point continue to treat the treatable but no CPR or intubation.  Active with hospice care  Psycho-social/Spiritual:  Desire for further Chaplaincy support:no Additional Recommendations: Caregiving  Support/Resources and Education on Hospice  Prognosis:  < 3 months, would not be surprising based on chronic illness burden, decreasing functional status, 2 hospital visits and 2 ED visits in the last 6 months.   Discharge Planning: Home with Hospice      Primary Diagnoses: Present on Admission:  Acute blood loss anemia (ABLA)  Prostate cancer (HCC)  Paroxysmal atrial fibrillation (HCC)  Mixed hyperlipidemia  Malignant neoplasm metastatic to bone [facility])  Iron deficiency anemia  Essential hypertension   I have reviewed the medical record, interviewed the patient and family, and examined the patient. The following aspects are pertinent.  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   found during hospital stay with pancreatitis,  started coumadin;   last Abd CT in epic 07-29-2020  chronic thrombus portal venous   GERD (gastroesophageal reflux disease)    Heart murmur    History of acute pancreatitis    2016   necrotizing pancreatitis   History of  adenomatous polyp of colon    History of DVT of lower extremity    History of GI bleed 08/2019   upper gi bleed due to duodenal ulcer   History of gout yrs ago   Hypertension    followed by pcp   Hypertrophic cardiomyopathy (HCC)    followed by cardiology   Internal hemorrhoids    Mixed hyperlipidemia    Normocytic anemia    PAF (paroxysmal atrial fibrillation) (HCC) 04/2016   cardiologist-- dr h. Katrinka Blazing--  event monitor 07-04-2017 epic, NSR/ PAflutter with occasional RVR/  PACs/ PVCs ;  echo 05-16-2017 epic,  moderate LVH with severe asymptomic septal hypertrophy, G1DD, EF 60-65%,  mild AR, mild LAE   Pancreatic pseudocyst    Peyronie's disease    Prostate cancer [facility]) urologist-- dr wrenn/ oncologist-- dr Kathrynn Running   first  dx 08/ 2009 Gleason 3+3 active survelliance;  until bx 07/ 2021  Stage T2b, Gleason 4+3   Right inguinal hernia    Type 2 diabetes mellitus (HCC)    followed by pcp---  (11-03-2020 per pt currently not checking blood sugar due to glucose monitor broken)   Wears glasses    Wears partial dentures    upper and lower   Social History   Socioeconomic History   Marital status: Married    Spouse name: Editor, commissioning   Number of children: 4   Years of education: Not on file   Highest education level: 10th grade  Occupational History   Occupation: Retired     Associate Professor: [employer]  Tobacco Use   Smoking status: Former    Years: 5    Types: Cigarettes    Quit date: 12/11/1986    Years since quitting: 36.4   Smokeless tobacco: Never  Vaping Use   Vaping Use: Never used  Substance and Sexual Activity   Alcohol use: No   Drug use: Never   Sexual activity: Yes  Other Topics Concern   Not on file  Social History Narrative   Not on file   Social Determinants of Health   Financial Resource Strain: Low Risk  (08/12/2021)   Overall Financial Resource Strain (CARDIA)    Difficulty of Paying Living Expenses: Not hard at all  Food Insecurity: No Food Insecurity (05/14/2023)    Hunger Vital Sign    Worried About Running Out of Food in the Last Year: Never true    Ran Out of Food in the Last Year: Never true  Recent Concern: Food Insecurity - Food Insecurity Present (03/02/2023)   Hunger Vital Sign    Worried About Running Out of Food in the Last Year: Sometimes true    Ran Out of Food in the Last Year: Sometimes true  Transportation Needs: No Transportation Needs (05/14/2023)   PRAPARE - Administrator, Civil Service (Medical): No    Lack of Transportation (Non-Medical): No  Physical Activity: Inactive (10/25/2021)   Exercise Vital Sign    Days of Exercise per Week: 0 days    Minutes of Exercise per Session: 0 min  Stress: Stress Concern Present (10/25/2021)   [employer] of Occupational Health - Occupational Stress Questionnaire    Feeling of Stress : To some extent  Social Connections: Socially Integrated (08/12/2021)   Social Connection and Isolation Panel [NHANES]    Frequency of Communication with Friends and Family: Three times a week    Frequency of Social Gatherings with Friends and Family: Three times a week    Attends Religious Services: More than 4 times per year    Active Member of Clubs or Organizations: Yes    Attends Engineer, structural: More than 4 times per year    Marital Status: Married   Family History  Problem Relation Age of Onset   Stroke Mother    Hypertension Mother    Alzheimer's disease Mother    Hypertension Father    Brain cancer Sister    Breast cancer Sister    Hypertension Sister    Deep vein thrombosis Brother    Hypertension Brother    Colon cancer Neg Hx    Pancreatic disease Neg Hx    Esophageal cancer Neg Hx    Rectal cancer Neg Hx    Stomach cancer Neg Hx    Scheduled Meds:  sodium chloride   Intravenous Once   calcium  carbonate  500 mg of elemental calcium Oral BID WC   Chlorhexidine Gluconate Cloth  6 each Topical Q0600   fentaNYL  1 patch Transdermal Q72H   ferrous sulfate  325  mg Oral Q breakfast   folic acid  1 mg Oral Daily   magnesium oxide  400 mg Oral BID   metoprolol succinate  50 mg Oral Daily   pantoprazole  40 mg Oral Daily   polyethylene glycol  17 g Oral Daily   senna  2 tablet Oral Daily   Continuous Infusions:  sodium chloride 50 mL/hr at 05/14/23 1712   PRN Meds:.acetaminophen **OR** acetaminophen, ondansetron **OR** ondansetron (ZOFRAN) IV, oxyCODONE, traZODone Medications Prior to Admission:  Prior to Admission medications   Medication Sig Start Date End Date Taking? Authorizing Provider  acetaminophen (TYLENOL) 325 MG tablet Take 2 tablets (650 mg total) by mouth every 6 (six) hours as needed for mild pain, fever or headache (or Fever >/= 101). 03/04/23  Yes Emokpae, Courage, MD  amLODipine (NORVASC) 10 MG tablet Take 10 mg by mouth daily. 04/18/23  Yes [provider]  calcium carbonate (OSCAL) 1500 (600 Ca) MG TABS tablet Take 1,500 mg by mouth 2 (two) times daily with a meal.   Yes [provider]  diclofenac Sodium (VOLTAREN) 1 % GEL Apply 1 application topically 2 (two) times daily as needed (pain).   Yes [provider]  fentaNYL (DURAGESIC) 12 MCG/HR Place 1 patch onto the skin every 3 (three) days.   Yes [provider]  ferrous sulfate (SV IRON) 325 (65 FE) MG tablet Take 1 tablet (325 mg total) by mouth daily with breakfast. 04/02/23  Yes Dettinger, Elige Radon, MD  fluticasone (FLONASE) 50 MCG/ACT nasal spray Place 1 spray into both nostrils 2 (two) times daily as needed for allergies or rhinitis. 11/05/15  Yes Dettinger, Elige Radon, MD  magnesium oxide (MAG-OX) 400 (240 Mg) MG tablet Take 1 tablet (400 mg total) by mouth in the morning, at noon, and at bedtime. 11/29/22  Yes Doreatha Massed, MD  metFORMIN (GLUCOPHAGE) 1000 MG tablet Take 1 tablet (1,000 mg total) by mouth 2 (two) times daily with a meal. Patient taking differently: Take 1,000 mg by mouth daily. 05/29/22  Yes Dettinger, Elige Radon, MD   metoprolol succinate (TOPROL-XL) 100 MG 24 hr tablet Take 1 tablet (100 mg total) by mouth daily. Take with or immediately following a meal. 01/12/23  Yes Dettinger, Elige Radon, MD  omeprazole (PRILOSEC) 20 MG capsule Take 1 capsule (20 mg total) by mouth daily. 01/12/23  Yes Dettinger, Elige Radon, MD  oxyCODONE (OXY IR/ROXICODONE) 5 MG immediate release tablet Take 5 mg by mouth every 4 (four) hours as needed for severe pain.   Yes [provider]  traZODone (DESYREL) 50 MG tablet Take 0.5-1 tablets (25-50 mg total) by mouth at bedtime as needed for sleep. 03/19/23  Yes Dettinger, Elige Radon, MD  Blood Glucose Monitoring Suppl (ONE TOUCH ULTRA MINI) w/Device KIT 1 each by Does not apply route 2 (two) times daily. 01/14/21   Dettinger, Elige Radon, MD  Blood Pressure KIT 1 each by Does not apply route daily. 08/16/22   Dettinger, Elige Radon, MD  glucose blood (ONE TOUCH ULTRA TEST) test strip Use to check BG once daily.  Dx:  Type 2 DM controlled E11.9 01/12/23   Dettinger, Elige Radon, MD   Allergies  Allergen Reactions   Allopurinol Other (See Comments)    Significantly decreased WBC's   Colchicine Other (  See Comments)    Significantly decreased WBC's   Review of Systems  Unable to perform ROS: Age    Physical Exam Vitals and nursing note reviewed.  Constitutional:      General: He is not in acute distress.    Appearance: He is ill-appearing.  HENT:     Mouth/Throat:     Mouth: Mucous membranes are dry.  Cardiovascular:     Rate and Rhythm: Normal rate.  Pulmonary:     Effort: Pulmonary effort is normal.  Neurological:     Mental Status: He is alert and oriented to person, place, and time.  Psychiatric:        Mood and Affect: Mood normal.        Behavior: Behavior normal.     Vital Signs: BP 137/65   Pulse 77   Temp 97.9 F (36.6 C) (Oral)   Resp 15   Ht 5\' 5"  (1.651 m)   Wt 61.2 kg   SpO2 100%   BMI 22.45 kg/m  Pain Scale: 0-10   Pain Score: 0-No pain   SpO2: SpO2: 100  % O2 Device:SpO2: 100 % O2 Flow Rate: .   IO: Intake/output summary:  Intake/Output Summary (Last 24 hours) at 05/15/2023 1150 Last data filed at 05/15/2023 1043 Gross per 24 hour  Intake 1406.82 ml  Output 600 ml  Net 806.82 ml    LBM:   Baseline Weight: Weight: 61.2 kg Most recent weight: Weight: 61.2 kg     Palliative Assessment/Data:     Time In: 0840 Time Out: 0955 Time Total: 75 minutes  Greater than 50%  of this time was spent counseling and coordinating care related to the above assessment and plan.  Signed by: Katheran Awe, NP   Please contact Palliative Medicine Team phone at [phone] for questions and concerns.  For individual provider: See Loretha Stapler

## 2023-05-15 NOTE — Discharge Instructions (Addendum)
Nutrition Post Hospital Stay Proper nutrition can help your body recover from illness and injury.   Foods and beverages high in protein, vitamins, and minerals help rebuild muscle loss, promote healing, & reduce fall risk.   In addition to eating healthy foods, a nutrition shake is an easy, delicious way to get the nutrition you need during and after your hospital stay  It is recommended that you continue to drink 2 bottles per day of: high protein/high calorie nutrition shakes for at least 1 month (30 days) after your hospital stay   Tips for adding a nutrition shake into your routine: As allowed, drink one with vitamins or medications instead of water or juice Enjoy one as a tasty mid-morning or afternoon snack Drink cold or make a milkshake out of it Drink one instead of milk with cereal or snacks Use as a coffee creamer   Available at the following grocery stores and pharmacies:           * Karin Golden * [employer] * [employer]  * [facility]          * Walmart * [employer] Club  * Wal[facility]reens      * Target  * BJ's   * [facility]S  * [employer]   * [facility] [phone]            For COUPONS visit: www.ensure.com/join or RoleLink.com.br   Suggested Substitutions Ensure Plus = Boost Plus = Carnation Breakfast Essentials = Boost Compact Ensure Active Clear = Boost Breeze Glucerna Shake = Boost Glucose Control = Carnation Breakfast Essentials SUGAR FREE

## 2023-05-15 NOTE — Progress Notes (Signed)
Second unit of PRBC complete with no adverse reaction.  Still up in chair.  Wife and granddaughter visited earlier

## 2023-05-15 NOTE — Progress Notes (Addendum)
Initial Nutrition Assessment  DOCUMENTATION CODES:   Severe malnutrition in context of chronic illness  INTERVENTION:  Ensure Plus with meals (add ice cream or greek yogurt) per patient flavor preferences  Assist with feeding as appropriate to maximize intake   Gentle encouragement of nutrition as patient allows   Provide his favorite foods   NUTRITION DIAGNOSIS:   Severe Malnutrition related to chronic illness (prostate cancer with metastisis to bone) as evidenced by severe muscle depletion, severe fat depletion, energy intake < or equal to 75% for > or equal to 1 month (recent significant unplanned weight loss (see RD note)).   GOAL:   (Comment) (allow patient to eat and drink as desired)  MONITOR:  PO intake, Supplement acceptance, Labs  REASON FOR ASSESSMENT:   Consult Assessment of nutrition requirement/status  ASSESSMENT: Patient is a 82 yo male who has a hx of DM, GERD, metastatic prostate cancer, and Failure to Thrive. Presents with report of chronically dark stools, hemoglobin (5.5), recurrent blood loss anemia per chart. Patient followed [facility].   Per MD he has received 2 units of PRBC and has 2 more units ordered. Patient is receiving his 2nd unit of blood during RD visit and is planned for discharge home later today.   Patient appetite - poor overall per spouse. He eats 1 meal daily and is a very slow eater. Often falls asleep during meal and / or wife has to reheat his food. He complains the Ensure is too sweet- we talked about ways to make the shakes more palatable for him.   Per chart patient has acute weight loss in March (5.6% < 1 month) from 65.3 kg to current 61.2 kg. He has been maintaining 61-63 kg the past 2 months. In February his weight was up to 67.1 kg.   Severe chronic malnutrition -per diet recall; energy intake </=75% for >/= 1 month, NFPE: severe muscle and fat loss.   Medications: ferrous sulfate (325 mg daily), calcium carbonate,  folic acid (1 mg/day), mag-ox (400 mg BID)     Latest Ref Rng & Units 05/15/2023    4:37 AM 05/14/2023   12:49 PM 05/11/2023    4:06 PM  BMP  Glucose 70 - 99 mg/dL 161  096  045   BUN 8 - 23 mg/dL 17  21  15    Creatinine 0.61 - 1.24 mg/dL 4.09  8.11  9.14   BUN/Creat Ratio 10 - 24   18   Sodium 135 - 145 mmol/L 131  131  137   Potassium 3.5 - 5.1 mmol/L 3.6  4.1  4.2   Chloride 98 - 111 mmol/L 103  103  104   CO2 22 - 32 mmol/L 21  19  18    Calcium 8.9 - 10.3 mg/dL 7.4  7.5  7.9       NUTRITION - FOCUSED PHYSICAL EXAM:  Flowsheet Row Most Recent Value  Orbital Region Moderate depletion  Upper Arm Region Severe depletion  Thoracic and Lumbar Region Severe depletion  Buccal Region Moderate depletion  Temple Region Moderate depletion  Clavicle Bone Region Severe depletion  Clavicle and Acromion Bone Region Severe depletion  Scapular Bone Region Severe depletion  Dorsal Hand Severe depletion  Edema (RD Assessment) None  Hair Reviewed  Skin Reviewed  Nails Reviewed      Diet Order:   Diet Order             Diet regular Room service appropriate? Yes; Fluid consistency: Thin  Diet effective now                   EDUCATION NEEDS:  Education needs have been addressed (discussed with his wife)  Skin:  Skin Assessment: Reviewed RN Assessment  Last BM:  unknown  Height:   Ht Readings from Last 1 Encounters:  05/14/23 5\' 5"  (1.651 m)    Weight:   Wt Readings from Last 1 Encounters:  05/14/23 61.2 kg    Ideal Body Weight:   61.82   BMI:  Body mass index is 22.45 kg/m.  Estimated Nutritional Needs:   Kcal:  1900-2100  Protein:  90-99 gr  Fluid:  2 liters daily   Royann Shivers MS,RD,CSG,LDN Contact: Loretha Stapler

## 2023-05-15 NOTE — Discharge Summary (Addendum)
Physician Discharge Summary   Patient: Alan [name]. MRN: [id number] DOB: Jun 14, 1941  Admit date:     05/14/2023  Discharge date: 05/15/23  Discharge Physician: Onalee Hua Enrique Weiss   PCP: Dettinger, Elige Radon, MD   Recommendations at discharge:   Please follow up with primary care provider within 1-2 weeks  Please repeat CBC in one week     Hospital Course: 82 year old male with a history of metastatic prostate cancer to bone, hypertrophic cardiomyopathy, diabetes mellitus type 2, hypertension, hyperlipidemia, paroxysmal atrial fibrillation, right lower extremity DVT presenting with abnormal blood work showing low hemoglobin from his PCP.  The patient was contacted by his PCP-go to the emergency department for blood transfusion when he was noted his hemoglobin was 5.5.  The patient himself has been complaining of increasing fatigue and some dyspnea on exertion.  The patient's spouse at the bedside supplements history.  She has noted a continued functional decline over the past 2 to 3 months.  At baseline, the patient is able to make transfers but is essentially wheelchair-bound.  He has not had any fevers, chills, chest pain, worsening shortness of breath, nausea, vomiting or diarrhea.  He has had some dark stools without any hematochezia.  He denies any hematemesis.  He denies hematuria. Notably, the patient was recently mated to the hospital from 03/01/2023 to 03/04/2023 secondary to supratherapeutic INR of 8.4.  He was given vitamin K and FFP.  His hemoglobin was noted to be 6.0 during that admission.  He was transfused 2 units PRBC.  He was discharged home with apixaban. Since that discharge, the patient has follow-up with his PCP.  Since he has been enrolled with [facility] hosp[facility] and given that he continues to have blood loss anemia, his apixaban was discontinued altogether on 03/19/2023. In the ED, the patient was afebrile hemodynamically stable with oxygen saturation 99% on room air.  WBC 5.3,  hemoglobin 5.7, platelets 95,000.  Sodium 131, potassium 4.1, bicarbonate 19, serum creatinine 1.13.  UA showed 6-10 WBC, 21-50 RBC.  2 units PRBC were ordered.  Assessment and Plan: Symptomatic anemia/acute blood loss anemia -2 units PRBC transfused 6/3 -2 more units transfused 6/4 (total 4 units) -The patient and her spouse are not interested in any aggressive care or endoscopy at this time -Check iron sat--35%; ferritin 4793 -Continue PPI -B12--879 -Folic acid--5.6>>replete -TSH--1.252 -Hgb 10.7 on day of d/c   Metastatic prostate cancer to bone -CT abdomen and pelvis on 01/11/2023 shows progression of his disease.   -Saw Dr. 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 Verdis Prime -Supportive care at this point -Continue metoprolol succinate   Essential hypertension -Holding amlodipine -Continue metoprolol   Thrombocytopenia -Check B12--879 -folate--5.6>>>repleting -d/c home with folic acid 1 mg daily  Severe malnutrition -continue Ensure  Plus     Consultants: palliative Procedures performed: none  Disposition: Home Diet recommendation:  Regular diet DISCHARGE MEDICATION: Allergies as of 05/15/2023       Reactions   Allopurinol Other (See Comments)   Significantly decreased WBC's   Colchicine Other (See Comments)   Significantly decreased WBC's        Medication List     TAKE these medications    acetaminophen 325 MG tablet Commonly known as: TYLENOL Take 2 tablets (650 mg total) by mouth every 6 (six) hours as needed for mild pain, fever or headache (or Fever >/= 101).   amLODipine 10 MG tablet Commonly known as: NORVASC Take 10 mg by mouth daily.   Blood Pressure Kit 1 each by Does not apply route daily.   calcium carbonate 1500 (600 Ca) MG Tabs tablet Commonly known as: OSCAL Take 1,500 mg by mouth 2 (two) times daily with a meal.   diclofenac Sodium 1 % Gel Commonly known as: VOLTAREN Apply 1 application topically 2 (two) times daily as needed (pain).   fentaNYL 12 MCG/HR Commonly known as: DURAGESIC Place 1 patch onto the skin every 3 (three) days.   ferrous sulfate 325 (65 FE) MG tablet Commonly known as: SV Iron Take 1 tablet (325 mg total) by mouth daily with breakfast.   fluticasone 50 MCG/ACT nasal spray Commonly known as: FLONASE Place 1 spray into both nostrils 2 (two) times daily as needed for allergies or rhinitis.   folic acid 1 MG tablet Commonly known as: FOLVITE Take 1 tablet (1 mg total) by mouth daily. Start taking on: May 16, 2023   glucose blood test strip Commonly known as: ONE TOUCH ULTRA TEST Use to check BG once daily.  Dx:  Type 2 DM controlled E11.9   magnesium oxide 400 (240 Mg) MG tablet Commonly known as: MAG-OX Take 1 tablet (400 mg total) by mouth in the morning, at noon, and at bedtime.   metFORMIN 1000 MG tablet Commonly known as: GLUCOPHAGE Take 1 tablet (1,000 mg total) by mouth 2 (two) times daily with a meal. What changed: when to take this    metoprolol succinate 100 MG 24 hr tablet Commonly known as: TOPROL-XL Take 1 tablet (100 mg total) by mouth daily. Take with or immediately following a meal.   omeprazole 20 MG capsule Commonly known as: PRILOSEC Take 1 capsule (20 mg total) by mouth daily.   ONE TOUCH ULTRA MINI w/Device Kit 1 each by Does not apply route 2 (two) times daily.   oxyCODONE 5 MG immediate release tablet Commonly known as: Oxy IR/ROXICODONE Take 5 mg by mouth every 4 (four) hours as needed for severe pain.   traZODone 50 MG tablet Commonly known as: DESYREL Take 0.5-1 tablets (25-50 mg total) by mouth at bedtime as needed for sleep.        Discharge Exam: Filed Weights   05/14/23 1231 05/14/23 1700  Weight: 61.2 kg 61.2 kg   HEENT:  [place]/AT, No thrush, no icterus CV:  RRR, no rub, no S3, no S4 Lung:  CTA, no wheeze, no rhonchi Abd:  soft/+BS, NT Ext:  No edema, no lymphangitis, no synovitis, no rash   Condition at discharge: stable  The results of significant diagnostics from this hospitalization (including imaging, microbiology, ancillary and laboratory) are listed below for reference.   Imaging Studies: No results found.  Microbiology: Results for orders placed or performed in visit on 04/02/23  Fecal occult blood,  imunochemical([facility])     Status: None   Collection Time: 04/12/23  4:26 PM   Specimen: Stool   ST  Result Value Ref Range Status   Fecal Occult Bld Negative Negative Final    Labs: CBC: Recent Labs  Lab 05/11/23 1606 05/14/23 1249 05/15/23 0437 05/15/23 0657  WBC 6.1 5.3 4.1  --   NEUTROABS 4.5 3.8  --   --   HGB 5.5* 5.7* 6.8* 6.8*  HCT 17.1* 18.3* 20.5* 21.1*  MCV 91 96.8 88.7  --   PLT 105* 95* 66*  --    Basic Metabolic Panel: Recent Labs  Lab 05/11/23 1606 05/14/23 1249 05/15/23 0437  NA 137 131* 131*  K 4.2 4.1 3.6  CL 104 103 103  CO2 18* 19* 21*  GLUCOSE 138* 207* 131*  BUN 15 21 17   CREATININE 0.84 1.13 0.91  CALCIUM 7.9*  7.5* 7.4*   Liver Function Tests: Recent Labs  Lab 05/11/23 1606 05/14/23 1249  AST 50* 71*  ALT 12 16  ALKPHOS 1,423* 1,342*  BILITOT 1.0 1.5*  PROT 5.2* 5.7*  ALBUMIN 3.0* 2.6*   CBG: No results for input(s): "GLUCAP" in the last 168 hours.  Discharge time spent: greater than 30 minutes.  Signed: Catarina Hartshorn, MD [facility] 05/15/2023

## 2023-05-16 LAB — TYPE AND SCREEN
ABO/RH(D): B POS
Antibody Screen: NEGATIVE
Unit division: 0

## 2023-05-16 LAB — BPAM RBC
Blood Product Expiration Date: 202406282359
Blood Product Expiration Date: 202407042359
ISSUE DATE / TIME: 202406031418

## 2023-05-17 ENCOUNTER — Ambulatory Visit: Payer: No Typology Code available for payment source | Admitting: Family Medicine

## 2023-05-24 ENCOUNTER — Emergency Department (HOSPITAL_COMMUNITY): Payer: Medicare Other

## 2023-05-24 ENCOUNTER — Other Ambulatory Visit: Payer: Self-pay

## 2023-05-24 ENCOUNTER — Encounter (HOSPITAL_COMMUNITY): Payer: Self-pay | Admitting: Hematology

## 2023-05-24 ENCOUNTER — Encounter (HOSPITAL_COMMUNITY): Payer: Self-pay | Admitting: *Deleted

## 2023-05-24 ENCOUNTER — Inpatient Hospital Stay (HOSPITAL_COMMUNITY)
Admission: EM | Admit: 2023-05-24 | Discharge: 2023-06-11 | DRG: 871 | Disposition: E | Payer: Medicare Other | Attending: Internal Medicine | Admitting: Internal Medicine

## 2023-05-24 DIAGNOSIS — G9341 Metabolic encephalopathy: Secondary | ICD-10-CM | POA: Diagnosis present

## 2023-05-24 DIAGNOSIS — R64 Cachexia: Secondary | ICD-10-CM | POA: Diagnosis present

## 2023-05-24 DIAGNOSIS — Z808 Family history of malignant neoplasm of other organs or systems: Secondary | ICD-10-CM

## 2023-05-24 DIAGNOSIS — C61 Malignant neoplasm of prostate: Secondary | ICD-10-CM | POA: Diagnosis present

## 2023-05-24 DIAGNOSIS — Z8249 Family history of ischemic heart disease and other diseases of the circulatory system: Secondary | ICD-10-CM

## 2023-05-24 DIAGNOSIS — Z79891 Long term (current) use of opiate analgesic: Secondary | ICD-10-CM

## 2023-05-24 DIAGNOSIS — J181 Lobar pneumonia, unspecified organism: Secondary | ICD-10-CM | POA: Diagnosis present

## 2023-05-24 DIAGNOSIS — J9601 Acute respiratory failure with hypoxia: Secondary | ICD-10-CM | POA: Diagnosis present

## 2023-05-24 DIAGNOSIS — Z7984 Long term (current) use of oral hypoglycemic drugs: Secondary | ICD-10-CM

## 2023-05-24 DIAGNOSIS — R54 Age-related physical debility: Secondary | ICD-10-CM | POA: Diagnosis present

## 2023-05-24 DIAGNOSIS — D6859 Other primary thrombophilia: Secondary | ICD-10-CM | POA: Diagnosis present

## 2023-05-24 DIAGNOSIS — E782 Mixed hyperlipidemia: Secondary | ICD-10-CM | POA: Diagnosis present

## 2023-05-24 DIAGNOSIS — N401 Enlarged prostate with lower urinary tract symptoms: Secondary | ICD-10-CM | POA: Diagnosis present

## 2023-05-24 DIAGNOSIS — I4891 Unspecified atrial fibrillation: Secondary | ICD-10-CM | POA: Diagnosis present

## 2023-05-24 DIAGNOSIS — J189 Pneumonia, unspecified organism: Secondary | ICD-10-CM | POA: Diagnosis not present

## 2023-05-24 DIAGNOSIS — Z79899 Other long term (current) drug therapy: Secondary | ICD-10-CM

## 2023-05-24 DIAGNOSIS — I422 Other hypertrophic cardiomyopathy: Secondary | ICD-10-CM | POA: Diagnosis present

## 2023-05-24 DIAGNOSIS — Z1152 Encounter for screening for COVID-19: Secondary | ICD-10-CM | POA: Diagnosis not present

## 2023-05-24 DIAGNOSIS — E119 Type 2 diabetes mellitus without complications: Secondary | ICD-10-CM | POA: Diagnosis present

## 2023-05-24 DIAGNOSIS — N179 Acute kidney failure, unspecified: Secondary | ICD-10-CM | POA: Diagnosis present

## 2023-05-24 DIAGNOSIS — C7951 Secondary malignant neoplasm of bone: Secondary | ICD-10-CM | POA: Diagnosis present

## 2023-05-24 DIAGNOSIS — Z8719 Personal history of other diseases of the digestive system: Secondary | ICD-10-CM

## 2023-05-24 DIAGNOSIS — Z803 Family history of malignant neoplasm of breast: Secondary | ICD-10-CM

## 2023-05-24 DIAGNOSIS — Z8546 Personal history of malignant neoplasm of prostate: Secondary | ICD-10-CM

## 2023-05-24 DIAGNOSIS — Z7901 Long term (current) use of anticoagulants: Secondary | ICD-10-CM

## 2023-05-24 DIAGNOSIS — Z820 Family history of epilepsy and other diseases of the nervous system: Secondary | ICD-10-CM

## 2023-05-24 DIAGNOSIS — A419 Sepsis, unspecified organism: Secondary | ICD-10-CM | POA: Diagnosis present

## 2023-05-24 DIAGNOSIS — K219 Gastro-esophageal reflux disease without esophagitis: Secondary | ICD-10-CM | POA: Diagnosis present

## 2023-05-24 DIAGNOSIS — Z6822 Body mass index (BMI) 22.0-22.9, adult: Secondary | ICD-10-CM

## 2023-05-24 DIAGNOSIS — Z515 Encounter for palliative care: Secondary | ICD-10-CM | POA: Diagnosis not present

## 2023-05-24 DIAGNOSIS — D6489 Other specified anemias: Secondary | ICD-10-CM | POA: Diagnosis present

## 2023-05-24 DIAGNOSIS — E860 Dehydration: Secondary | ICD-10-CM | POA: Diagnosis present

## 2023-05-24 DIAGNOSIS — E43 Unspecified severe protein-calorie malnutrition: Secondary | ICD-10-CM | POA: Diagnosis present

## 2023-05-24 DIAGNOSIS — Z87891 Personal history of nicotine dependence: Secondary | ICD-10-CM

## 2023-05-24 DIAGNOSIS — D696 Thrombocytopenia, unspecified: Secondary | ICD-10-CM | POA: Diagnosis present

## 2023-05-24 DIAGNOSIS — R6521 Severe sepsis with septic shock: Secondary | ICD-10-CM | POA: Diagnosis present

## 2023-05-24 DIAGNOSIS — G893 Neoplasm related pain (acute) (chronic): Secondary | ICD-10-CM | POA: Diagnosis present

## 2023-05-24 DIAGNOSIS — Z66 Do not resuscitate: Secondary | ICD-10-CM | POA: Diagnosis present

## 2023-05-24 DIAGNOSIS — J90 Pleural effusion, not elsewhere classified: Secondary | ICD-10-CM | POA: Diagnosis present

## 2023-05-24 DIAGNOSIS — I480 Paroxysmal atrial fibrillation: Secondary | ICD-10-CM | POA: Diagnosis present

## 2023-05-24 DIAGNOSIS — D733 Abscess of spleen: Secondary | ICD-10-CM | POA: Diagnosis not present

## 2023-05-24 DIAGNOSIS — Z7189 Other specified counseling: Secondary | ICD-10-CM | POA: Diagnosis not present

## 2023-05-24 DIAGNOSIS — E872 Acidosis, unspecified: Secondary | ICD-10-CM | POA: Diagnosis present

## 2023-05-24 DIAGNOSIS — I10 Essential (primary) hypertension: Secondary | ICD-10-CM | POA: Diagnosis present

## 2023-05-24 DIAGNOSIS — R6520 Severe sepsis without septic shock: Secondary | ICD-10-CM | POA: Diagnosis not present

## 2023-05-24 DIAGNOSIS — Z823 Family history of stroke: Secondary | ICD-10-CM

## 2023-05-24 DIAGNOSIS — Z86718 Personal history of other venous thrombosis and embolism: Secondary | ICD-10-CM

## 2023-05-24 LAB — I-STAT CHEM 8, ED
BUN: 41 mg/dL — ABNORMAL HIGH (ref 8–23)
Calcium, Ion: 0.96 mmol/L — ABNORMAL LOW (ref 1.15–1.40)
Chloride: 107 mmol/L (ref 98–111)
Creatinine, Ser: 1.7 mg/dL — ABNORMAL HIGH (ref 0.61–1.24)
Glucose, Bld: 171 mg/dL — ABNORMAL HIGH (ref 70–99)
HCT: 22 % — ABNORMAL LOW (ref 39.0–52.0)
Hemoglobin: 7.5 g/dL — ABNORMAL LOW (ref 13.0–17.0)
Potassium: 4.3 mmol/L (ref 3.5–5.1)
Sodium: 139 mmol/L (ref 135–145)
TCO2: 21 mmol/L — ABNORMAL LOW (ref 22–32)

## 2023-05-24 LAB — RESP PANEL BY RT-PCR (RSV, FLU A&B, COVID)  RVPGX2
Influenza A by PCR: NEGATIVE
Influenza B by PCR: NEGATIVE
Resp Syncytial Virus by PCR: NEGATIVE
SARS Coronavirus 2 by RT PCR: NEGATIVE

## 2023-05-24 LAB — COMPREHENSIVE METABOLIC PANEL
ALT: 21 U/L (ref 0–44)
AST: 117 U/L — ABNORMAL HIGH (ref 15–41)
Albumin: 2 g/dL — ABNORMAL LOW (ref 3.5–5.0)
Alkaline Phosphatase: 1945 U/L — ABNORMAL HIGH (ref 38–126)
Anion gap: 14 (ref 5–15)
BUN: 43 mg/dL — ABNORMAL HIGH (ref 8–23)
CO2: 17 mmol/L — ABNORMAL LOW (ref 22–32)
Calcium: 6.9 mg/dL — ABNORMAL LOW (ref 8.9–10.3)
Chloride: 108 mmol/L (ref 98–111)
Creatinine, Ser: 1.63 mg/dL — ABNORMAL HIGH (ref 0.61–1.24)
GFR, Estimated: 42 mL/min — ABNORMAL LOW (ref >60)
Glucose, Bld: 173 mg/dL — ABNORMAL HIGH (ref 70–99)
Potassium: 4.2 mmol/L (ref 3.5–5.1)
Sodium: 139 mmol/L (ref 135–145)
Total Bilirubin: 2.5 mg/dL — ABNORMAL HIGH (ref 0.3–1.2)
Total Protein: 4.6 g/dL — ABNORMAL LOW (ref 6.5–8.1)

## 2023-05-24 LAB — CBC WITH DIFFERENTIAL/PLATELET
Abs Immature Granulocytes: 0.2 10*3/uL — ABNORMAL HIGH (ref 0.00–0.07)
Band Neutrophils: 13 %
Basophils Absolute: 0 10*3/uL (ref 0.0–0.1)
Basophils Relative: 0 %
Eosinophils Absolute: 0 10*3/uL (ref 0.0–0.5)
Eosinophils Relative: 0 %
HCT: 26.2 % — ABNORMAL LOW (ref 39.0–52.0)
Hemoglobin: 8.4 g/dL — ABNORMAL LOW (ref 13.0–17.0)
Lymphocytes Relative: 32 %
Lymphs Abs: 1.6 10*3/uL (ref 0.7–4.0)
MCH: 29.1 pg (ref 26.0–34.0)
MCHC: 32.1 g/dL (ref 30.0–36.0)
MCV: 90.7 fL (ref 80.0–100.0)
Metamyelocytes Relative: 4 %
Monocytes Absolute: 0.3 10*3/uL (ref 0.1–1.0)
Monocytes Relative: 6 %
Neutro Abs: 2.8 10*3/uL (ref 1.7–7.7)
Neutrophils Relative %: 45 %
Platelets: 29 10*3/uL — CL (ref 150–400)
RBC: 2.89 MIL/uL — ABNORMAL LOW (ref 4.22–5.81)
RDW: 19.2 % — ABNORMAL HIGH (ref 11.5–15.5)
WBC: 4.9 10*3/uL (ref 4.0–10.5)
nRBC: 1.6 % — ABNORMAL HIGH (ref 0.0–0.2)

## 2023-05-24 LAB — URINALYSIS, W/ REFLEX TO CULTURE (INFECTION SUSPECTED)
Bilirubin Urine: NEGATIVE
Glucose, UA: NEGATIVE mg/dL
Ketones, ur: NEGATIVE mg/dL
Nitrite: NEGATIVE
Protein, ur: 100 mg/dL — AB
Specific Gravity, Urine: 1.032 — ABNORMAL HIGH (ref 1.005–1.030)
pH: 5 (ref 5.0–8.0)

## 2023-05-24 LAB — MRSA NEXT GEN BY PCR, NASAL: MRSA by PCR Next Gen: NOT DETECTED

## 2023-05-24 LAB — GLUCOSE, CAPILLARY: Glucose-Capillary: 153 mg/dL — ABNORMAL HIGH (ref 70–99)

## 2023-05-24 LAB — LACTIC ACID, PLASMA
Lactic Acid, Venous: 5.4 mmol/L (ref 0.5–1.9)
Lactic Acid, Venous: 4.9 mmol/L (ref 0.5–1.9)
Lactic Acid, Venous: 4 mmol/L (ref 0.5–1.9)

## 2023-05-24 LAB — APTT: aPTT: 39 seconds — ABNORMAL HIGH (ref 24–36)

## 2023-05-24 LAB — PROTIME-INR
INR: 1.7 — ABNORMAL HIGH (ref 0.8–1.2)
Prothrombin Time: 20.5 seconds — ABNORMAL HIGH (ref 11.4–15.2)

## 2023-05-24 MED ORDER — ACETAMINOPHEN 325 MG PO TABS
650.0000 mg | ORAL_TABLET | Freq: Four times a day (QID) | ORAL | Status: DC | PRN
  Administered 2023-05-25: 650 mg via ORAL
  Filled 2023-05-24: qty 2

## 2023-05-24 MED ORDER — LACTATED RINGERS IV BOLUS
1000.0000 mL | Freq: Once | INTRAVENOUS | Status: AC
  Administered 2023-05-24: 1000 mL via INTRAVENOUS

## 2023-05-24 MED ORDER — ACETAMINOPHEN 650 MG RE SUPP
650.0000 mg | Freq: Once | RECTAL | Status: AC
  Administered 2023-05-24: 650 mg via RECTAL
  Filled 2023-05-24: qty 1

## 2023-05-24 MED ORDER — ACETAMINOPHEN 650 MG RE SUPP: 650.0000 mg | Freq: Four times a day (QID) | RECTAL | Status: DC | PRN

## 2023-05-24 MED ORDER — ACETAMINOPHEN 650 MG RE SUPP: 650.0000 mg | Freq: Once | RECTAL | Status: DC

## 2023-05-24 MED ORDER — LACTATED RINGERS IV BOLUS (SEPSIS)
1000.0000 mL | Freq: Once | INTRAVENOUS | Status: AC
  Administered 2023-05-24: 1000 mL via INTRAVENOUS

## 2023-05-24 MED ORDER — IOHEXOL 300 MG/ML  SOLN
75.0000 mL | Freq: Once | INTRAMUSCULAR | Status: AC | PRN
  Administered 2023-05-24: 75 mL via INTRAVENOUS

## 2023-05-24 MED ORDER — ORAL CARE MOUTH RINSE: 15.0000 mL | OROMUCOSAL | Status: DC | PRN

## 2023-05-24 MED ORDER — ONDANSETRON HCL 4 MG PO TABS: 4.0000 mg | ORAL_TABLET | Freq: Four times a day (QID) | ORAL | Status: DC | PRN

## 2023-05-24 MED ORDER — ONDANSETRON HCL 4 MG/2ML IJ SOLN: 4.0000 mg | Freq: Four times a day (QID) | INTRAMUSCULAR | Status: DC | PRN

## 2023-05-24 MED ORDER — VANCOMYCIN HCL 1250 MG/250ML IV SOLN: 1250.0000 mg | INTRAVENOUS | Status: DC

## 2023-05-24 MED ORDER — OXYCODONE HCL 5 MG PO TABS
5.0000 mg | ORAL_TABLET | ORAL | Status: DC | PRN
  Administered 2023-05-24 – 2023-05-26 (×6): 5 mg via ORAL
  Filled 2023-05-24 (×6): qty 1

## 2023-05-24 MED ORDER — LACTATED RINGERS IV SOLN: INTRAVENOUS | Status: DC

## 2023-05-24 MED ORDER — METRONIDAZOLE 500 MG/100ML IV SOLN
500.0000 mg | Freq: Two times a day (BID) | INTRAVENOUS | Status: DC
  Administered 2023-05-24 – 2023-05-26 (×4): 500 mg via INTRAVENOUS
  Filled 2023-05-24 (×4): qty 100

## 2023-05-24 MED ORDER — SODIUM CHLORIDE 0.9 % IV SOLN
2.0000 g | Freq: Once | INTRAVENOUS | Status: AC
  Administered 2023-05-24: 2 g via INTRAVENOUS
  Filled 2023-05-24: qty 12.5

## 2023-05-24 MED ORDER — SODIUM CHLORIDE 0.9 % IV SOLN: INTRAVENOUS | Status: AC

## 2023-05-24 MED ORDER — INSULIN ASPART 100 UNIT/ML IJ SOLN
0.0000 [IU] | INTRAMUSCULAR | Status: DC
  Administered 2023-05-25 (×2): 1 [IU] via SUBCUTANEOUS
  Administered 2023-05-25 (×3): 2 [IU] via SUBCUTANEOUS
  Administered 2023-05-25 – 2023-05-27 (×8): 1 [IU] via SUBCUTANEOUS

## 2023-05-24 MED ORDER — VANCOMYCIN HCL 1500 MG/300ML IV SOLN
1500.0000 mg | Freq: Once | INTRAVENOUS | Status: AC
  Administered 2023-05-24: 1500 mg via INTRAVENOUS
  Filled 2023-05-24: qty 300

## 2023-05-24 MED ORDER — SODIUM CHLORIDE 0.9 % IV SOLN
2.0000 g | Freq: Two times a day (BID) | INTRAVENOUS | Status: DC
  Administered 2023-05-25 – 2023-05-28 (×7): 2 g via INTRAVENOUS
  Filled 2023-05-24 (×7): qty 12.5

## 2023-05-24 MED ORDER — CHLORHEXIDINE GLUCONATE CLOTH 2 % EX PADS
6.0000 | MEDICATED_PAD | Freq: Every day | CUTANEOUS | Status: DC
  Administered 2023-05-24 – 2023-05-28 (×4): 6 via TOPICAL

## 2023-05-24 MED ORDER — POLYETHYLENE GLYCOL 3350 17 G PO PACK: 17.0000 g | PACK | Freq: Every day | ORAL | Status: DC | PRN

## 2023-05-24 NOTE — Assessment & Plan Note (Signed)
Platelets 29, last checked 9 days ago platelets were 65, gradual decline over the past month from 160s.

## 2023-05-24 NOTE — ED Provider Notes (Signed)
Patient presents with altered mental status and meets criteria for sepsis.  Started on sepsis protocol and given IV antibiotics, fluids, and Tylenol.  He is awake though altered and tachypneic.  I had multiple discussions with wife as well as family about goals of care and treatment.  Discussed that no matter what we do there is a high chance he could still die.  Family seems understand this.  For now they would like to continue with treatment which includes antibiotics and fluids but would not want ICU level care such as pressors, ventilator, CPR, etc.  Discussed with this multiple times and for now they do not want comfort care but know that that is possibly what he will need soon.  Will need admission.  CRITICAL CARE Performed by: Audree Camel   Total critical care time: 60 minutes  Critical care time was exclusive of separately billable procedures and treating other patients.  Critical care was necessary to treat or prevent imminent or life-threatening deterioration.  Critical care was time spent personally by me on the following activities: development of treatment plan with patient and/or surrogate as well as nursing, discussions with consultants, evaluation of patient's response to treatment, examination of patient, obtaining history from patient or surrogate, ordering and performing treatments and interventions, ordering and review of laboratory studies, ordering and review of radiographic studies, pulse oximetry and re-evaluation of patient's condition.    Pricilla Loveless, MD 05/24/23 [phone]

## 2023-05-24 NOTE — ED Notes (Signed)
ED TO INPATIENT HANDOFF REPORT  ED Nurse Name and Phone #: Atalya Dano [phone]  S Name/Age/Gender Alan [name]. 82 y.o. male Room/Bed: APA01/APA01  Code Status   Code Status: Prior  Home/SNF/Other Home Patient oriented to: self Is this baseline? No   Triage Complete: Triage complete  Chief Complaint Severe sepsis (HCC) [A41.9, R65.20]  Triage Note PT BIB [facility] for altered mental status, reported pt not drinking since yesterday.  Pt has hospice at home due to bone/ prostate CA.  Pt arrived with foley catheter in place and NRB.   Allergies Allergies  Allergen Reactions   Allopurinol Other (See Comments)    Significantly decreased WBC's   Colchicine Other (See Comments)    Significantly decreased WBC's    Level of Care/Admitting Diagnosis ED Disposition     ED Disposition  Admit   Condition  --   Comment  Hospital Area: [facility] [[id number]]  Level of Care: Stepdown [14]  Covid Evaluation: Asymptomatic - no recent exposure (last 10 days) testing not required  Diagnosis: Severe sepsis [facility]) [[id number]]  Admitting Physician: Cresenciano Lick  Attending Physician: Onnie Boer Xenia.Douglas  Certification:: I certify this patient will need inpatient services for at least 2 midnights  Estimated Length of Stay: 2          B Medical/Surgery History 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   found during hospital stay with pancreatitis,  started coumadin;   last Abd CT in epic 07-29-2020  chronic thrombus portal venous   GERD (gastroesophageal reflux disease)    Heart murmur    History of acute pancreatitis    2016   necrotizing pancreatitis   History of adenomatous polyp of colon    History of DVT  of lower extremity    History of GI bleed 08/2019   upper gi bleed due to duodenal ulcer   History of gout yrs ago   Hypertension    followed by pcp   Hypertrophic cardiomyopathy (HCC)    followed by cardiology   Internal hemorrhoids    Mixed hyperlipidemia    Normocytic anemia    PAF (paroxysmal atrial fibrillation) (HCC) 04/2016   cardiologist-- dr h. Katrinka Blazing--  event monitor 07-04-2017 epic, NSR/ PAflutter with occasional RVR/  PACs/ PVCs ;  echo 05-16-2017 epic,  moderate LVH with severe asymptomic septal hypertrophy, G1DD, EF 60-65%,  mild AR, mild LAE   Pancreatic pseudocyst    Peyronie's disease    Prostate cancer [facility]) urologist-- dr wrenn/ oncologist-- dr Kathrynn Running   first dx 08/ 2009 Gleason 3+3 active survelliance;  until bx 07/ 2021  Stage T2b, Gleason 4+3   Right inguinal hernia    Type 2 diabetes mellitus (HCC)    followed by pcp---  (11-03-2020 per pt currently not checking blood sugar due to glucose monitor broken)   Wears glasses    Wears partial dentures    upper and lower   Past Surgical History:  Procedure Laterality Date   BIOPSY  02/09/2022   Procedure: BIOPSY;  Surgeon: Rachael Fee, MD;  Location: [facility];  Service: Endoscopy;;   CHOLECYSTECTOMY N/A 11/26/2015   Procedure: LAPAROSCOPIC CHOLECYSTECTOMY;  Surgeon: Franky Macho, MD;  Location: [facility];  Service: General;  Laterality: N/A;   COLONOSCOPY  last one 09-06-2020  dr stark   CYSTOSCOPY N/A 11/09/2020   Procedure: Derinda Late;  Surgeon: Bjorn Pippin, MD;  Location: [facility];  Service: Urology;  Laterality: N/A;   ESOPHAGOGASTRODUODENOSCOPY  10-06-2019  @[facility]    ESOPHAGOGASTRODUODENOSCOPY N/A 02/09/2022   Procedure: ESOPHAGOGASTRODUODENOSCOPY (EGD);  Surgeon: Rachael Fee, MD;  Location: [facility];  Service: Endoscopy;  Laterality: N/A;   EUS N/A 02/09/2022   Procedure: UPPER ENDOSCOPIC ULTRASOUND (EUS) RADIAL;  Surgeon: Rachael Fee, MD;  Location: [facility];   Service: Endoscopy;  Laterality: N/A;   FLEXIBLE BRONCHOSCOPY Bilateral 04/14/2016   Procedure: FLEXIBLE BRONCHOSCOPY;  Surgeon: Kari Baars, MD;  [facility];  Service: Cardiopulmonary;  Laterality: Bilateral;   HEMORRHOID SURGERY  yrs ago   RADIOACTIVE SEED IMPLANT N/A 11/09/2020   Procedure: RADIOACTIVE SEED IMPLANT/BRACHYTHERAPY IMPLANT;  Surgeon: Bjorn Pippin, MD;  Location: [facility];  Service: Urology;  Laterality: N/A;   SPACE OAR INSTILLATION N/A 11/09/2020   Procedure: SPACE OAR INSTILLATION;  Surgeon: Bjorn Pippin, MD;  Location: [facility];  Service: Urology;  Laterality: N/A;   UMBILICAL HERNIA REPAIR N/A 11/26/2015   Procedure: UMBILICAL HERNIORRHAPHY;  Surgeon: Franky Macho, MD;  Location: [facility];  Service: General;  Laterality: N/A;     A IV Location/Drains/Wounds Patient Lines/Drains/Airways Status     Active Line/Drains/Airways     Name Placement date Placement time Site Days   Peripheral IV 05/30/2023 20 G Right Hand 05/31/2023  1415  Hand  less than 1   Peripheral IV 05/13/2023 20 G 1.88" Left Antecubital 05/16/2023  1429  Antecubital  less than 1   Peripheral IV 06/10/2023 22 G 1" Right;Lateral Forearm 05/30/2023  1430  Forearm  less than 1   Urethral Catheter Sheralyn Boatman RN Coude;Double-lumen 16 Fr. 05/03/23  0152  Coude;Double-lumen  21            Intake/Output Last 24 hours  Intake/Output Summary (Last 24 hours) at 06/05/2023 1940 Last data filed at 05/30/2023 1847 Gross per 24 hour  Intake 2901.5 ml  Output --  Net 2901.5 ml    Labs/Imaging Results for orders placed or performed during the hospital encounter of 06/07/2023 (from the past 48 hour(s))  I-stat chem 8, ed     Status: Abnormal   Collection Time: 06/02/2023  2:36 PM  Result Value Ref Range   Sodium 139 135 - 145 mmol/L   Potassium 4.3 3.5 - 5.1 mmol/L   Chloride 107 98 - 111 mmol/L   BUN 41 (H) 8 - 23 mg/dL   Creatinine, Ser 1.61 (H) 0.61 - 1.24 mg/dL   Glucose,  Bld 096 (H) 70 - 99 mg/dL    Comment: Glucose reference range applies only to samples taken after fasting for at least 8 hours.   Calcium, Ion 0.96 (L) 1.15 - 1.40 mmol/L   TCO2 21 (L) 22 - 32 mmol/L   Hemoglobin 7.5 (L) 13.0 - 17.0 g/dL   HCT 04.5 (L) 40.9 - 81.1 %  Comprehensive metabolic panel     Status: Abnormal   Collection Time: 05/12/2023  2:38 PM  Result Value Ref Range   Sodium 139 135 - 145 mmol/L   Potassium 4.2 3.5 - 5.1 mmol/L   Chloride 108 98 - 111 mmol/L   CO2 17 (L) 22 - 32 mmol/L   Glucose, Bld 173 (H) 70 - 99 mg/dL    Comment:  Glucose reference range applies only to samples taken after fasting for at least 8 hours.   BUN 43 (H) 8 - 23 mg/dL   Creatinine, Ser 1.61 (H) 0.61 - 1.24 mg/dL   Calcium 6.9 (L) 8.9 - 10.3 mg/dL   Total Protein 4.6 (L) 6.5 - 8.1 g/dL   Albumin 2.0 (L) 3.5 - 5.0 g/dL   AST 096 (H) 15 - 41 U/L   ALT 21 0 - 44 U/L   Alkaline Phosphatase 1,945 (H) 38 - 126 U/L    Comment: RESULTS CONFIRMED BY MANUAL DILUTION   Total Bilirubin 2.5 (H) 0.3 - 1.2 mg/dL   GFR, Estimated 42 (L) >60 mL/min    Comment: (NOTE) Calculated using the CKD-EPI Creatinine Equation (2021)    Anion gap 14 5 - 15    Comment: Performed at [facility], [address], [place], [place] [address]  Lactic acid, plasma     Status: Abnormal   Collection Time: 05/28/2023  2:38 PM  Result Value Ref Range   Lactic Acid, Venous 5.4 (HH) 0.5 - 1.9 mmol/L    Comment: CRITICAL RESULT CALLED TO, READ BACK BY AND VERIFIED WITH SMALL[place]OD,M ON 05/15/2023 AT 1520 BY LOY,C Performed at [facility], [address] [place], [place] [address]   CBC with Differential     Status: Abnormal   Collection Time: 06/05/2023  2:38 PM  Result Value Ref Range   WBC 4.9 4.0 - 10.5 K/uL   RBC 2.89 (L) 4.22 - 5.81 MIL/uL   Hemoglobin 8.4 (L) 13.0 - 17.0 g/dL   HCT 14.7 (L) 82.9 - 56.2 %   MCV 90.7 80.0 - 100.0 fL   MCH 29.1 26.0 - 34.0 pg   MCHC 32.1 30.0 - 36.0 g/dL   RDW 13.0 (H) 86.5 - 78.4 %    Platelets 29 (LL) 150 - 400 K/uL    Comment: SPECIMEN CHECKED FOR CLOTS Immature Platelet Fraction may be clinically indicated, consider ordering this additional test [id number] REPEATED TO VERIFY THIS CRITICAL RESULT HAS VERIFIED AND BEEN CALLED TO TURNER, [place] BY ASHLEY FRATTO ON 06 13 2024 AT 1539, AND HAS BEEN READ BACK.     nRBC 1.6 (H) 0.0 - 0.2 %   Neutrophils Relative % 45 %   Neutro Abs 2.8 1.7 - 7.7 K/uL   Band Neutrophils 13 %   Lymphocytes Relative 32 %   Lymphs Abs 1.6 0.7 - 4.0 K/uL   Monocytes Relative 6 %   Monocytes Absolute 0.3 0.1 - 1.0 K/uL   Eosinophils Relative 0 %   Eosinophils Absolute 0.0 0.0 - 0.5 K/uL   Basophils Relative 0 %   Basophils Absolute 0.0 0.0 - 0.1 K/uL   WBC Morphology MORPHOLOGY UNREMARKABLE    Smear Review PLATELET COUNT CONFIRMED BY SMEAR    Metamyelocytes Relative 4 %   Abs Immature Granulocytes 0.20 (H) 0.00 - 0.07 K/uL   Tear Drop Cells PRESENT    Ovalocytes PRESENT     Comment: Performed at [facility], [address], [place], [place] [address]  Protime-INR     Status: Abnormal   Collection Time: 05/20/2023  2:38 PM  Result Value Ref Range   Prothrombin Time 20.5 (H) 11.4 - 15.2 seconds   INR 1.7 (H) 0.8 - 1.2    Comment: (NOTE) INR goal varies based on device and disease states. Performed at [facility], [address] [place], [place] [address]   APTT     Status: Abnormal   Collection Time:  2023/06/20  2:38 PM  Result Value Ref Range   aPTT 39 (H) 24 - 36 seconds    Comment:        IF BASELINE aPTT IS ELEVATED, SUGGEST PATIENT RISK ASSESSMENT BE USED TO DETERMINE APPROPRIATE ANTICOAGULANT THERAPY. Performed at [facility], [address] [place], [place] [address]   Resp panel by RT-PCR (RSV, Flu A&B, Covid) Anterior Nasal Swab     Status: None   Collection Time: Jun 20, 2023  3:03 PM   Specimen: Anterior Nasal Swab  Result Value Ref Range   SARS Coronavirus 2 by RT PCR NEGATIVE NEGATIVE    Comment: (NOTE) SARS-CoV-2  target nucleic acids are NOT DETECTED.  The SARS-CoV-2 RNA is generally detectable in upper respiratory specimens during the acute phase of infection. The lowest concentration of SARS-CoV-2 viral copies this assay can detect is 138 copies/mL. A negative result does not preclude SARS-Cov-2 infection and should not be used as the sole basis for treatment or other patient management decisions. A negative result may occur with  improper specimen collection/handling, submission of specimen other than nasopharyngeal swab, presence of viral mutation(s) within the areas targeted by this assay, and inadequate number of viral copies(<138 copies/mL). A negative result must be combined with clinical observations, patient history, and epidemiological information. The expected result is Negative.  Fact Sheet for Patients:  BloggerCourse.com  Fact Sheet for Healthcare Providers:  SeriousBroker.it  This test is no t yet approved or cleared by the [place] FDA and  has been authorized for detection and/or diagnosis of SARS-CoV-2 by FDA under an Emergency Use Authorization (EUA). This EUA will remain  in effect (meaning this test can be used) for the duration of the COVID-19 declaration under Section 564(b)(1) of the Act, 21 U.S.C.section 360bbb-3(b)(1), unless the authorization is terminated  or revoked sooner.       Influenza A by PCR NEGATIVE NEGATIVE   Influenza B by PCR NEGATIVE NEGATIVE    Comment: (NOTE) The Xpert Xpress SARS-CoV-2/FLU/RSV plus assay is intended as an aid in the diagnosis of influenza from Nasopharyngeal swab specimens and should not be used as a sole basis for treatment. Nasal washings and aspirates are unacceptable for Xpert Xpress SARS-CoV-2/FLU/RSV testing.  Fact Sheet for Patients: BloggerCourse.com  Fact Sheet for Healthcare Providers: SeriousBroker.it  This  test is not yet approved or cleared by the [place] FDA and has been authorized for detection and/or diagnosis of SARS-CoV-2 by FDA under an Emergency Use Authorization (EUA). This EUA will remain in effect (meaning this test can be used) for the duration of the COVID-19 declaration under Section 564(b)(1) of the Act, 21 U.S.C. section 360bbb-3(b)(1), unless the authorization is terminated or revoked.     Resp Syncytial Virus by PCR NEGATIVE NEGATIVE    Comment: (NOTE) Fact Sheet for Patients: BloggerCourse.com  Fact Sheet for Healthcare Providers: SeriousBroker.it  This test is not yet approved or cleared by the [place] FDA and has been authorized for detection and/or diagnosis of SARS-CoV-2 by FDA under an Emergency Use Authorization (EUA). This EUA will remain in effect (meaning this test can be used) for the duration of the COVID-19 declaration under Section 564(b)(1) of the Act, 21 U.S.C. section 360bbb-3(b)(1), unless the authorization is terminated or revoked.  Performed at [facility], [address] [place], [place] [address]   Lactic acid, plasma     Status: Abnormal   Collection Time: 06/20/23  4:26 PM  Result Value Ref Range   Lactic Acid, Venous 4.9 (  HH) 0.5 - 1.9 mmol/L    Comment: CRITICAL VALUE NOTED. VALUE IS CONSISTENT WITH PREVIOUSLY REPORTED/CALLED VALUE Performed at [facility], [address] [place], [place] [address]    CT ABDOMEN PELVIS W CONTRAST  Result Date: 05/20/2023 CLINICAL DATA:  Metastatic prostate cancer. Sepsis, altered mental status. * Tracking Code: BO * EXAM: CT ABDOMEN AND PELVIS WITH CONTRAST TECHNIQUE: Multidetector CT imaging of the abdomen and pelvis was performed using the standard protocol following bolus administration of intravenous contrast. RADIATION DOSE REDUCTION: This exam was performed according to the departmental dose-optimization program which includes automated  exposure control, adjustment of the mA and/or kV according to patient size and/or use of iterative reconstruction technique. CONTRAST:  75mL OMNIPAQUE IOHEXOL 300 MG/ML  SOLN COMPARISON:  01/11/2023 FINDINGS: Lower chest: Airspace filling consolidation appearance in the right lower lobe. Passive atelectasis in the left lower lobe. Moderate to large left and small right pleural effusion. Descending thoracic aortic atherosclerosis. Hepatobiliary: Heterogeneous enhancement in the liver with higher density peripherally, probably related to an unusual distribution of steatosis although hepatitis might also be considered. Cholecystectomy. No biliary dilatation or pneumobilia. Pancreas: Pancreas is somewhat blurred by motion artifact but there continues to be dilatation of the dorsal pancreatic duct up to 5 mm in diameter, not substantially changed from 01/11/2023. 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 Spleen: The spleen measures 11.3 by 7.9 by 11.8 cm (volume = 550 cm^3) and demonstrates multiple new small hypodense lesions which were not present on 01/11/2023, concerning for microabscesses given the clinical context. Splenic involvement would be an unusual pathway for spread of prostate cancer. Adrenals/Urinary Tract: Bilateral thickened adrenal glands, increased from prior. The kidneys appear unremarkable. Foley catheter in the otherwise empty urinary bladder. Stomach/Bowel: Small type 1 hiatal hernia. Suspected varices along the margin of the proximal stomach. Large indirect right inguinal hernia containing multiple loops of small bowel, the bottom of the cecum, and the appendix. Because these loops of bowel extend far down in the distended scrotum and well below the ischial tuberosities, the entirety of the bowel down in the scrotum is not  included. Sigmoid colon diverticulosis. Moderate prominence of rectal stool ball. 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 Atherosclerosis is present, including aortoiliac atherosclerotic disease. There is atheromatous plaque proximally in the celiac trunk and SMA without overt occlusion. Reproductive: Brachytherapy seed implants in the indistinctly marginated prostate gland. Other: Small amount of edema along the root of the mesentery. Mild nonspecific presacral edema. Musculoskeletal: Diffuse patchy bony sclerosis compatible with widespread osseous metastatic disease. Lumbar spondylosis causes mild multilevel foraminal impingement. Large right indirect inguinal hernia containing loops of bowel including small bowel loops and the appendix. A small indirect left inguinal hernia contains adipose tissue. IMPRESSION: 1. Multiple new small hypodense lesions in the spleen, concerning for microabscesses given the clinical context. 2. Airspace filling consolidation in the right lower lobe, suspicious for pneumonia. 3. Moderate to large left and small right pleural effusion. 4. Heterogeneous enhancement in the liver with higher density peripherally, probably related to an unusual distribution of steatosis although hepatitis might also be considered. 5. 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 6. Large indirect right inguinal hernia containing multiple loops of small bowel,  the bottom of the cecum, and the appendix. Because these loops of bowel extend far down in the distended scrotum and well below the ischial tuberosities, the entirety of the bowel down in the scrotum is not included. 7. Sigmoid colon diverticulosis. 8. Small type 1 hiatal hernia. 9. 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 10.  Brachytherapy seed implants in the indistinctly marginated prostate gland. 11. Aortic atherosclerosis. Aortic Atherosclerosis (ICD10-I70.0). Electronically Signed   By: Gaylyn Rong M.D.   On: June 03, 2023 17:44   CT Head Wo Contrast  Result Date: 2023-06-03 CLINICAL DATA:  Altered mental status EXAM: CT HEAD WITHOUT CONTRAST TECHNIQUE: Contiguous axial images were obtained from the base of the skull through the vertex without intravenous contrast. RADIATION DOSE REDUCTION: This exam was performed according to the departmental dose-optimization program which includes automated exposure control, adjustment of the mA and/or kV according to patient size and/or use of iterative reconstruction technique. COMPARISON:  None Available. FINDINGS: Brain: No acute intracranial findings are seen. There are no signs of bleeding within the cranium. Dense calcifications are seen in basal ganglia on both sides. Cortical sulci are prominent. There is decreased density in periventricular white matter. Vascular: There are scattered coarse arterial calcifications. Skull: No acute findings are seen. Sinuses/Orbits: Unremarkable. Other: None. IMPRESSION: No acute intracranial findings are seen. Atrophy. Small-vessel disease. Electronically Signed   By: Ernie Avena M.D.   On: 06-03-2023 17:32   DG Chest Portable 1 View  Result Date: Jun 03, 2023 CLINICAL DATA:  Altered mental status.  History of prostate cancer EXAM: PORTABLE CHEST 1 VIEW COMPARISON:  X-ray 03/27/2022 and older FINDINGS: Developing small left effusion and adjacent lung opacity. Possible infiltrate. No pneumothorax or edema. Normal cardiopericardial silhouette. Overlapping cardiac leads. Surgical clips in the right upper quadrant. Scattered areas of bony sclerosis identified consistent with known osseous metastatic disease from prostate cancer. IMPRESSION: New small left effusion and adjacent opacity. Possible infiltrate. Recommend follow-up  Electronically Signed   By: Karen Kays M.D.   On: 06-03-2023 15:15    Pending Labs Unresulted Labs (From admission, onward)     Start     Ordered   05/25/23 0500  Creatinine, serum  Every Mon-Wed-Fri (0500),   R (with STAT occurrences)      2023/06/03 1600   06/03/2023 1421  Remove and replace urinary cath (placed > 5 days) then obtain urine culture from new indwelling urinary catheter.  (Septic presentation on arrival (screening labs, nursing and treatment orders for obvious sepsis))  Once,   URGENT       Question:  Indication  Answer:  Sepsis   06-03-2023 1423   2023-06-03 1413  Culture, blood (Routine x 2)  BLOOD CULTURE X 2,   R (with STAT occurrences)      2023/06/03 1412   06-03-23 1413  Urinalysis, w/ Reflex to Culture (Infection Suspected) -Urine, Catheterized; Indwelling urinary catheter  Once,   URGENT       Question Answer Comment  Specimen Source Urine, Catheterized   Specimen Source Indwelling urinary catheter      03-Jun-2023 1412            Vitals/Pain Today's Vitals   06-03-2023 1830 2023/06/03 1845 03-Jun-2023 1900 Jun 03, 2023 1905  BP: (!) 105/48 (!) 90/58 (!) 98/54   Pulse: (!) 137 (!) 121 (!) 140 (!) 142  Resp: (!) 37 (!) 30 (!) 40 (!) 43  Temp:      TempSrc:      SpO2: 95% 96%  93% 93%  PainSc:        Isolation Precautions No active isolations  Medications Medications  lactated ringers infusion ( Intravenous New Bag/Given 06-18-2023 1849)  ceFEPIme (MAXIPIME) 2 g in sodium chloride 0.9 % 100 mL IVPB (has no administration in time range)  vancomycin (VANCOREADY) IVPB 1250 mg/250 mL (has no administration in time range)  lactated ringers bolus 1,000 mL (0 mLs Intravenous Stopped 06-18-2023 1847)    And  lactated ringers bolus 1,000 mL (0 mLs Intravenous Stopped 06/18/2023 1847)  ceFEPIme (MAXIPIME) 2 g in sodium chloride 0.9 % 100 mL IVPB (0 g Intravenous Stopped Jun 18, 2023 1506)  acetaminophen (TYLENOL) suppository 650 mg (650 mg Rectal Given by Other 06-18-23 1433)  vancomycin  (VANCOREADY) IVPB 1500 mg/300 mL (0 mg Intravenous Stopped 06/18/23 1835)  iohexol (OMNIPAQUE) 300 MG/ML solution 75 mL (75 mLs Intravenous Contrast Given 06/18/2023 1703)    Mobility non-ambulatory     Focused Assessments     R Recommendations: See Admitting Provider Note  Report given to:   Additional Notes:  pt is not comfort care at this time but may turn to that if does not approve in 24 hours per EDP. Family at bedside.

## 2023-05-24 NOTE — Assessment & Plan Note (Addendum)
Due to severe sepsis with hypotension, pneumonia, microabscesses, dehydration.  CT negative for acute abnormality. - Later patient is more awake, requesting pain medications,can have oral intake, otherwise remain NPO.

## 2023-05-24 NOTE — Progress Notes (Signed)
   05/24/23 1612  Spiritual Encounters  Type of Visit Initial  Care provided to: Patient;Family  Conversation partners present during encounter Nurse;Physician  Reason for visit End-of-life  OnCall Visit No  Spiritual Framework  Presenting Themes Goals in life/care;Values and beliefs;Significant life change  Community/Connection Family;Friend(s);Significant other;Faith community  Patient Stress Factors Major life changes  Family Stress Factors Loss  Interventions  Spiritual Care Interventions Made Established relationship of care and support;Compassionate presence;Reflective listening;Normalization of emotions;Decision-making support/facilitation;Narrative/life review;Bereavement/grief support;Prayer;Supported grief process  Spiritual Care Plan  Spiritual Care Issues Still Outstanding Chaplain will continue to follow   Referred to bedside through Administrative Coordinator. Found patient minimally responsive at beginning of visit and spouse [name] bedside. Chaplain engaged her in reflection around her goals for bringing her husband here for treatment and she shared that she wanted to make sure he remained comfortable and she sensed that something else was happening to him besides his Cancer. She understands that her husband is septic and that the likelihood of him surviving this hospitalization is very slim. She simply wanted him to get what he needed and she felt like she couldn't make certain he was comfortable at home. She wanted him to pass here at [facility] and not at the hospice house. Chaplain provided space for her to share her goals, feelings, and facilitated this decision today. Chaplain provided support to patient at end of visit as he was asking for water and acknowledged that he understood how very sick he was. Chaplain provided blessing at end of visit and will remain available in order to provide spiritual support and to assess for spiritual need.   Rev. Jolyn Lent,  M.Div Chaplain

## 2023-05-24 NOTE — ED Provider Notes (Signed)
Angiocath insertion Performed by: Audree Camel  Consent: Verbal consent obtained. Risks and benefits: risks, benefits and alternatives were discussed Time out: Immediately prior to procedure a "time out" was called to verify the correct patient, procedure, equipment, support staff and site/side marked as required.  Preparation: Patient was prepped and draped in the usual sterile fashion.  Vein Location: left basilic  Ultrasound Guided  Gauge: 20  Normal blood return and flush without difficulty Patient tolerance: Patient tolerated the procedure well with no immediate complications.     Pricilla Loveless, MD 05/24/23 [phone]

## 2023-05-24 NOTE — Sepsis Progress Note (Addendum)
Sepsis protocol is being followed by [facility]. 

## 2023-05-24 NOTE — H&P (Addendum)
History and Physical    [name]. ZOX:[id number] DOB: 1941/05/29 DOA: 05/20/2023  PCP: Dettinger, Elige Radon, MD   Patient coming from: Home  I have personally briefly reviewed patient's old medical records in [facility]  Chief Complaint: AMS  HPI: Alan [name]. is a 82 y.o. male with medical history significant for metastatic prostate cancer, atrial fibrillation, hypertension, DVT, hypertrophic cardiomyopathy, diabetes mellitus.  Patient is currently hospice patient.  He was brought to the ED with complaints of altered mental status over the past 2 days.  His last meal was about 2 days ago, he has not eaten since or drunk since then and has been confused.  Patient has a chronic indwelling Foley catheter. On my evaluation, patient's eyes are closed, but opens to touch, he is very lethargic, he knows in the hospital, but unable to give any history.  Spouse is at bedside and confirms above history.  Recent hospitalization 6/3 to 05/15/2023 for symptomatic acute blood loss anemia, required 4 units PRBC, patient declined aggressive care endoscopy.  ED Course: Febrile to 102, tachycardic heart rate initially up to 150s improved to 120s, tachypneic heart rate 29-49.  Hypotensive down to 73/45.  WBC 4.9.  Lactic acidosis 5.4 > 4.9.  Cr elevated 1.63.  Platelets low at 29. Blood Cultures obtained Head CT negative for acute abnormality. Abdominal CT-stain for microabscesses in the spleen, consolidation suspicious for pneumonia, moderate to large left and small right pleural effusion, loops of bowel extending further down in distended scrotum well below the ischial tuberosities. IV Vanco cefepime and metronidazole started.  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   found during hospital stay with pancreatitis,  started coumadin;   last Abd CT in epic 07-29-2020  chronic thrombus portal venous   GERD (gastroesophageal reflux disease)    Heart murmur    History of acute pancreatitis    2016   necrotizing pancreatitis   History of adenomatous polyp of colon    History of DVT of lower extremity    History of GI bleed 08/2019   upper gi bleed due to duodenal ulcer   History of gout yrs ago   Hypertension    followed by pcp   Hypertrophic cardiomyopathy (HCC)    followed by cardiology   Internal hemorrhoids    Mixed hyperlipidemia    Normocytic anemia    PAF (paroxysmal atrial fibrillation) (HCC) 04/2016   cardiologist-- dr h. Katrinka Blazing--  event monitor 07-04-2017 epic, NSR/ PAflutter with occasional RVR/  PACs/ PVCs ;  echo 05-16-2017 epic,  moderate LVH with severe asymptomic septal hypertrophy, G1DD, EF 60-65%,  mild AR, mild LAE   Pancreatic pseudocyst    Peyronie's disease    Prostate cancer [facility] urologist-- dr wrenn/ oncologist-- dr Kathrynn Running   first dx 08/ 2009 Gleason 3+3 active survelliance;  until bx 07/ 2021  Stage T2b, Gleason 4+3   Right inguinal hernia    Type 2 diabetes mellitus (HCC)    followed by pcp---  (11-03-2020 per pt currently not checking blood sugar due to glucose monitor broken)   Wears glasses    Wears partial  dentures    upper and lower    Past Surgical History:  Procedure Laterality Date   BIOPSY  02/09/2022   Procedure: BIOPSY;  Surgeon: Rachael Fee, MD;  Location: [facility];  Service: Endoscopy;;   CHOLECYSTECTOMY N/A 11/26/2015   Procedure: LAPAROSCOPIC CHOLECYSTECTOMY;  Surgeon: Franky Macho, MD;  Location: [facility];  Service: General;  Laterality: N/A;   COLONOSCOPY  last one 09-06-2020  dr stark   CYSTOSCOPY N/A 11/09/2020   Procedure: Derinda Late;  Surgeon: Bjorn Pippin, MD;  Location: [facility];  Service: Urology;  Laterality: N/A;   ESOPHAGOGASTRODUODENOSCOPY  10-06-2019  @[facility]    ESOPHAGOGASTRODUODENOSCOPY N/A 02/09/2022   Procedure: ESOPHAGOGASTRODUODENOSCOPY (EGD);  Surgeon: Rachael Fee, MD;  Location: [facility];  Service: Endoscopy;  Laterality: N/A;   EUS N/A 02/09/2022   Procedure: UPPER ENDOSCOPIC ULTRASOUND (EUS) RADIAL;  Surgeon: Rachael Fee, MD;  Location: [facility];  Service: Endoscopy;  Laterality: N/A;   FLEXIBLE BRONCHOSCOPY Bilateral 04/14/2016   Procedure: FLEXIBLE BRONCHOSCOPY;  Surgeon: Kari Baars, MD;  Location[facility];  Service: Cardiopulmonary;  Laterality: Bilateral;   HEMORRHOID SURGERY  yrs ago   RADIOACTIVE SEED IMPLANT N/A 11/09/2020   Procedure: RADIOACTIVE SEED IMPLANT/BRACHYTHERAPY IMPLANT;  Surgeon: Bjorn Pippin, MD;  Location: [facility];  Service: Urology;  Laterality: N/A;   SPACE OAR INSTILLATION N/A 11/09/2020   Procedure: SPACE OAR INSTILLATION;  Surgeon: Bjorn Pippin, MD;  Location: [facility];  Service: Urology;  Laterality: N/A;   UMBILICAL HERNIA REPAIR N/A 11/26/2015   Procedure: UMBILICAL HERNIORRHAPHY;  Surgeon: Franky Macho, MD;  Location: [facility];  Service: General;  Laterality: N/A;     reports that he quit smoking about 36 years ago. His smoking use included cigarettes. He has never used smokeless tobacco. He reports that he does not drink alcohol and does not use drugs.  Allergies  Allergen Reactions   Allopurinol Other (See Comments)    Significantly decreased WBC's   Colchicine Other (See Comments)    Significantly decreased WBC's    Family History  Problem Relation Age of Onset   Stroke Mother    Hypertension Mother    Alzheimer's disease Mother    Hypertension Father    Brain cancer Sister    Breast cancer Sister    Hypertension Sister    Deep vein thrombosis Brother    Hypertension Brother    Colon cancer Neg Hx    Pancreatic disease Neg Hx    Esophageal  cancer Neg Hx    Rectal cancer Neg Hx    Stomach cancer Neg Hx    Prior to Admission medications   Medication Sig Start Date End Date Taking? Authorizing Provider  acetaminophen (TYLENOL) 325 MG tablet Take 2 tablets (650 mg total) by mouth every 6 (six) hours as needed for mild pain, fever or headache (or Fever >/= 101). 03/04/23   Bonifacio Pruden, Courage, MD  amLODipine (NORVASC) 10 MG tablet Take 10 mg by mouth daily. 04/18/23   [provider]  Blood Glucose Monitoring Suppl (ONE TOUCH ULTRA MINI) w/Device KIT 1 each by Does not apply route 2 (two) times daily. 01/14/21   Dettinger, Elige Radon, MD  Blood Pressure KIT 1 each by Does not apply route daily. 08/16/22   Dettinger, Elige Radon, MD  calcium carbonate (OSCAL) 1500 (600 Ca) MG TABS tablet Take 1,500 mg by mouth 2 (two) times daily with a meal.    [provider]  diclofenac Sodium (VOLTAREN)  1 % GEL Apply 1 application topically 2 (two) times daily as needed (pain).    [provider]  fentaNYL (DURAGESIC) 12 MCG/HR Place 1 patch onto the skin every 3 (three) days.    [provider]  ferrous sulfate (SV IRON) 325 (65 FE) MG tablet Take 1 tablet (325 mg total) by mouth daily with breakfast. 04/02/23   Dettinger, Elige Radon, MD  fluticasone (FLONASE) 50 MCG/ACT nasal spray Place 1 spray into both nostrils 2 (two) times daily as needed for allergies or rhinitis. 11/05/15   Dettinger, Elige Radon, MD  folic acid (FOLVITE) 1 MG tablet Take 1 tablet (1 mg total) by mouth daily. 05/16/23   Catarina Hartshorn, MD  glucose blood (ONE TOUCH ULTRA TEST) test strip Use to check BG once daily.  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 11/29/22   Doreatha Massed, MD  metFORMIN (GLUCOPHAGE) 1000 MG tablet Take 1 tablet (1,000 mg total) by mouth 2 (two) times daily with a meal. Patient taking  differently: Take 1,000 mg by mouth daily. 05/29/22   Dettinger, Elige Radon, MD  metoprolol succinate (TOPROL-XL) 100 MG 24 hr tablet Take 1 tablet (100 mg total) by mouth daily. Take with or immediately following a meal. 01/12/23   Dettinger, Elige Radon, MD  omeprazole (PRILOSEC) 20 MG capsule Take 1 capsule (20 mg total) by mouth daily. 01/12/23   Dettinger, Elige Radon, MD  oxyCODONE (OXY IR/ROXICODONE) 5 MG immediate release tablet Take 5 mg by mouth every 4 (four) hours as needed for severe pain.    [provider]  traZODone (DESYREL) 50 MG tablet Take 0.5-1 tablets (25-50 mg total) by mouth at bedtime as needed for sleep. 03/19/23   Dettinger, Elige Radon, MD    Physical Exam: Vitals:   06-11-23 1830 11-Jun-2023 1845 06-11-2023 1900 06-11-2023 1905  BP: (!) 105/48 (!) 90/58 (!) 98/54   Pulse: (!) 137 (!) 121 (!) 140 (!) 142  Resp: (!) 37 (!) 30 (!) 40 (!) 43  Temp:      TempSrc:      SpO2: 95% 96% 93% 93%    Constitutional: Lethargic, acutely  ill-appearing  Vitals:   06-11-2023 1830 2023-06-11 1845 June 11, 2023 1900 06/11/23 1905  BP: (!) 105/48 (!) 90/58 (!) 98/54   Pulse: (!) 137 (!) 121 (!) 140 (!) 142  Resp: (!) 37 (!) 30 (!) 40 (!) 43  Temp:      TempSrc:      SpO2: 95% 96% 93% 93%   Eyes: Pupils equal, lids and conjunctivae normal ENMT: Mucous membranes are very dry  Neck: normal, supple, no masses, no thyromegaly Respiratory: clear to auscultation bilaterally, no wheezing, no crackles. Normal respiratory effort. No accessory muscle use.  Cardiovascular: Tachycardiac, irregular rate and rhythm, no murmurs / rubs / gallops. No extremity edema.  Extremities warm. Abdomen: no tenderness, no masses palpated. No hepatosplenomegaly. Bowel sounds positive.  Musculoskeletal: no clubbing / cyanosis. No joint deformity upper and lower extremities.  Skin: no rashes, lesions, ulcers. No induration Neurologic: No facial asymmetry, able to mumble enough to wife can understand what he is saying, barely  able to move any of his extremities. Psychiatric: Very lethargic, related to person and place  Labs on Admission: I have personally reviewed following labs and imaging studies  CBC: Recent Labs  Lab Jun 06, 2023 1436 Jun 06, 2023 1438  WBC  --  4.9  NEUTROABS  --  2.8  HGB 7.5* 8.4*  HCT 22.0* 26.2*  MCV  --  90.7  PLT  --  29*   Basic Metabolic Panel: Recent Labs  Lab 06/06/2023 1436 06/06/23 1438  NA 139 139  K 4.3 4.2  CL 107 108  CO2  --  17*  GLUCOSE 171* 173*  BUN 41* 43*  CREATININE 1.70* 1.63*  CALCIUM  --  6.9*   GFR: Estimated Creatinine Clearance: 31.3 mL/min (A) (by C-G formula based on SCr of 1.63 mg/dL (H)). Liver Function Tests: Recent Labs  Lab 06-06-2023 1438  AST 117*  ALT 21  ALKPHOS 1,945*  BILITOT 2.5*  PROT 4.6*  ALBUMIN 2.0*   Coagulation Profile: Recent Labs  Lab June 06, 2023 1438  INR 1.7*    Radiological Exams on Admission: CT ABDOMEN PELVIS W CONTRAST  Result Date: 06/06/23 CLINICAL DATA:  Metastatic prostate cancer. Sepsis, altered mental status. * Tracking Code: BO * EXAM: CT ABDOMEN AND PELVIS WITH CONTRAST TECHNIQUE: Multidetector CT imaging of the abdomen and pelvis was performed using the standard protocol following bolus administration of intravenous contrast. RADIATION DOSE REDUCTION: This exam was performed according to the departmental dose-optimization program which includes automated exposure control, adjustment of the mA and/or kV according to patient size and/or use of iterative reconstruction technique. CONTRAST:  75mL OMNIPAQUE IOHEXOL 300 MG/ML  SOLN COMPARISON:  01/11/2023 FINDINGS: Lower chest: Airspace filling consolidation appearance in the right lower lobe. Passive atelectasis in the left lower lobe. Moderate to large left and small right pleural effusion. Descending thoracic aortic atherosclerosis. Hepatobiliary: Heterogeneous enhancement in the liver with higher density peripherally, probably related to an unusual distribution  of steatosis although hepatitis might also be considered. Cholecystectomy. No biliary dilatation or pneumobilia. Pancreas: Pancreas is somewhat blurred by motion artifact but there continues to be dilatation of the dorsal pancreatic duct up to 5 mm in diameter, not substantially changed from 01/11/2023. 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 Spleen: The spleen measures 11.3 by 7.9 by 11.8 cm (volume = 550 cm^3) and demonstrates multiple new small hypodense lesions which were not present on 01/11/2023, concerning for microabscesses given the clinical context. Splenic involvement would be an unusual pathway for spread of prostate cancer. Adrenals/Urinary Tract: Bilateral thickened adrenal glands, increased from prior. The kidneys appear unremarkable. Foley catheter in the otherwise empty urinary bladder. Stomach/Bowel: Small type 1 hiatal hernia. Suspected varices along the margin of the proximal stomach. Large indirect right inguinal hernia containing multiple loops of small bowel, the bottom of the cecum, and the appendix. Because these loops of bowel extend far down in the distended scrotum and well below the ischial tuberosities, the entirety of the bowel down in the scrotum is not included. Sigmoid colon diverticulosis. Moderate prominence of rectal stool ball. 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 Atherosclerosis is present, including aortoiliac atherosclerotic disease. There is atheromatous plaque proximally in the celiac trunk and SMA without overt occlusion. Reproductive: Brachytherapy seed implants in the indistinctly marginated prostate gland. Other: Small amount of edema along the root of the mesentery. Mild nonspecific presacral edema. Musculoskeletal: Diffuse patchy  bony sclerosis compatible with widespread osseous metastatic disease. Lumbar spondylosis causes mild multilevel foraminal impingement. Large right indirect inguinal hernia containing  loops of bowel including small bowel loops and the appendix. A small indirect left inguinal hernia contains adipose tissue. IMPRESSION: 1. Multiple new small hypodense lesions in the spleen, concerning for microabscesses given the clinical context. 2. Airspace filling consolidation in the right lower lobe, suspicious for pneumonia. 3. Moderate to large left and small right pleural effusion. 4. Heterogeneous enhancement in the liver with higher density peripherally, probably related to an unusual distribution of steatosis although hepatitis might also be considered. 5. 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 6. Large indirect right inguinal hernia containing multiple loops of small bowel, the bottom of the cecum, and the appendix. Because these loops of bowel extend far down in the distended scrotum and well below the ischial tuberosities, the entirety of the bowel down in the scrotum is not included. 7. Sigmoid colon diverticulosis. 8. Small type 1 hiatal hernia. 9. 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 10. Brachytherapy seed implants in the indistinctly marginated prostate gland. 11. Aortic atherosclerosis. Aortic Atherosclerosis (ICD10-I70.0). Electronically Signed   By: Gaylyn Rong M.D.   On: 06/07/2023 17:44   CT Head Wo Contrast  Result Date: 06/06/2023 CLINICAL DATA:  Altered mental status EXAM: CT HEAD WITHOUT CONTRAST TECHNIQUE: Contiguous axial images were obtained from the base of the skull through the vertex without intravenous contrast. RADIATION DOSE REDUCTION: This exam was performed according to the departmental dose-optimization program which  includes automated exposure control, adjustment of the mA and/or kV according to patient size and/or use of iterative reconstruction technique. COMPARISON:  None Available. FINDINGS: Brain: No acute intracranial findings are seen. There are no signs of bleeding within the cranium. Dense calcifications are seen in basal ganglia on both sides. Cortical sulci are prominent. There is decreased density in periventricular white matter. Vascular: There are scattered coarse arterial calcifications. Skull: No acute findings are seen. Sinuses/Orbits: Unremarkable. Other: None. IMPRESSION: No acute intracranial findings are seen. Atrophy. Small-vessel disease. Electronically Signed   By: Ernie Avena M.D.   On: 05/16/2023 17:32   DG Chest Portable 1 View  Result Date: 06/04/2023 CLINICAL DATA:  Altered mental status.  History of prostate cancer EXAM: PORTABLE CHEST 1 VIEW COMPARISON:  X-ray 03/27/2022 and older FINDINGS: Developing small left effusion and adjacent lung opacity. Possible infiltrate. No pneumothorax or edema. Normal cardiopericardial silhouette. Overlapping cardiac leads. Surgical clips in the right upper quadrant. Scattered areas of bony sclerosis identified consistent with known osseous metastatic disease from prostate cancer. IMPRESSION: New small left effusion and adjacent opacity. Possible infiltrate. Recommend follow-up Electronically Signed   By: Karen Kays M.D.   On: 05/13/2023 15:15    EKG: Independently reviewed.  Atrial fibrillation, rate 142, QTc 486.  No significant change from prior.  Assessment/Plan Principal Problem:   Severe sepsis (HCC) Active Problems:   AKI (acute kidney injury) (HCC)   Pneumonia   Acute metabolic encephalopathy   Splenic abscess   Atrial fibrillation with RVR (HCC)   Thrombocytopenia (HCC)   Malignant neoplasm metastatic to bone [facility])    Assessment and Plan: * Severe sepsis (HCC) Meeting severe sepsis arterial with fever of 102, tachycardia  heart rate 117-152, tachypnea respiratory rate 29-44, with evidence of multiple endorgan dysfunction, significant lactic acidosis of 5.4 > 4.9, encephalopathy, AKI, thrombocytopenia.  Etiology likely secondary to pneumonia, and possibly splenic microabscesses seen on CT. COVID, influenza negative. -Continue broad-spectrum antibiotics IV vancomycin  cefepime and metronidazole -Follow up blood cultures -Follow-up UA and urine cultures -3 Liter LR bolus given, continue N/s 100cc/hr x 15hrs -Patient is a hospice patient.  ED provider, and myself have had extensive discussion with patient's family- spouse at bedside about expectation considering severity of illness, and goals of care. They are reasonable want IV antibiotics, fluids, and treating the treatable without aggressive interventions like central lines, and I confirmed that patient's is DNR.  Thrombocytopenia (HCC) Platelets 29, last checked 9 days ago platelets were 65, gradual decline over the past month from 160s.  Atrial fibrillation with RVR (HCC) Rate up to 140s, EKG shows atrial fibrillation.  Not on anticoagulation due to history of acute blood loss anemias, recent hospitalization requiring 4 units PRBC.  Home medications metoprolol 100 mg daily.  Systolic down to 25D, currently improved after 3 L bolus. -Hold home metoprolol -Will start amiodarone drip, I have talked to spouse about starting this medication especially as patient has not and will not be on anticoagulation.     Acute metabolic encephalopathy Due to severe sepsis with hypotension, pneumonia, microabscesses, dehydration.  CT negative for acute abnormality. - Later patient is more awake, requesting pain medications,can have oral intake, otherwise remain NPO.  Pneumonia Chest x-ray shows possible infiltrate, CT abdomen and pelvis with contrast shows airspace filling consolidation in the right lower lobe suspicious for pneumonia. - Broad spectrum antibiotics  AKI (acute  kidney injury) (HCC) Creatinine 1.6, baseline 0.8-1.1.  In the setting of severe sepsis, hypotension.  Malignant neoplasm metastatic to bone (HCC) .  Currently a hospice patient.  Prostate cancer metastatic to bone.  CT today shows diffuse patchy bony sclerosis compatible with widespread osseous metastatic disease.  -Saw Dr. Ellin Saba 02/21/2023--no longer candidate for active therapy/chemotherapy   DVT prophylaxis: SCDS Code Status: DNR-confirmed with spouse at bedside Family Communication: Spouse at bedside Disposition Plan:  > 2 days Consults called: None Admission status: Inpt step down I certify that at the point of admission it is my clinical judgment that the patient will require inpatient hospital care spanning beyond 2 midnights from the point of admission due to high intensity of service, high risk for further deterioration and high frequency of surveillance required.   Author: Onnie Boer, MD 05/23/2023 10:47 PM  For on call review [email or web address].

## 2023-05-24 NOTE — Plan of Care (Signed)
  Problem: Education: Goal: Knowledge of General Education information will improve Description: Including pain rating scale, medication(s)/side effects and non-pharmacologic comfort measures Outcome: Progressing   Problem: Health Behavior/Discharge Planning: Goal: Ability to manage health-related needs will improve Outcome: Progressing   Problem: Clinical Measurements: Goal: Ability to maintain clinical measurements within normal limits will improve Outcome: Progressing Goal: Will remain free from infection Outcome: Progressing Goal: Diagnostic test results will improve Outcome: Progressing Goal: Respiratory complications will improve Outcome: Progressing Goal: Cardiovascular complication will be avoided Outcome: Progressing   Problem: Activity: Goal: Risk for activity intolerance will decrease Outcome: Progressing   Problem: Nutrition: Goal: Adequate nutrition will be maintained Outcome: Progressing   Problem: Coping: Goal: Level of anxiety will decrease Outcome: Progressing   Problem: Elimination: Goal: Will not experience complications related to bowel motility Outcome: Progressing Goal: Will not experience complications related to urinary retention Outcome: Progressing   Problem: Pain Managment: Goal: General experience of comfort will improve Outcome: Progressing   Problem: Safety: Goal: Ability to remain free from injury will improve Outcome: Progressing   Problem: Skin Integrity: Goal: Risk for impaired skin integrity will decrease Outcome: Progressing   Problem: Education: Goal: Knowledge of disease or condition will improve Outcome: Progressing Goal: Understanding of medication regimen will improve Outcome: Progressing Goal: Individualized Educational Video(s) Outcome: Progressing   Problem: Activity: Goal: Ability to tolerate increased activity will improve Outcome: Progressing   Problem: Cardiac: Goal: Ability to achieve and maintain  adequate cardiopulmonary perfusion will improve Outcome: Progressing   Problem: Health Behavior/Discharge Planning: Goal: Ability to safely manage health-related needs after discharge will improve Outcome: Progressing   Problem: Education: Goal: Ability to describe self-care measures that may prevent or decrease complications (Diabetes Survival Skills Education) will improve Outcome: Progressing Goal: Individualized Educational Video(s) Outcome: Progressing   Problem: Coping: Goal: Ability to adjust to condition or change in health will improve Outcome: Progressing   Problem: Fluid Volume: Goal: Ability to maintain a balanced intake and output will improve Outcome: Progressing   Problem: Health Behavior/Discharge Planning: Goal: Ability to identify and utilize available resources and services will improve Outcome: Progressing Goal: Ability to manage health-related needs will improve Outcome: Progressing   Problem: Metabolic: Goal: Ability to maintain appropriate glucose levels will improve Outcome: Progressing   Problem: Nutritional: Goal: Maintenance of adequate nutrition will improve Outcome: Progressing Goal: Progress toward achieving an optimal weight will improve Outcome: Progressing   Problem: Skin Integrity: Goal: Risk for impaired skin integrity will decrease Outcome: Progressing   Problem: Tissue Perfusion: Goal: Adequacy of tissue perfusion will improve Outcome: Progressing   

## 2023-05-24 NOTE — ED Provider Notes (Addendum)
[place] EMERGENCY DEPARTMENT AT [facility] Provider Note   CSN: [id number] Arrival date & time: 06/09/2023  1356     History  Chief Complaint  Patient presents with   Altered Mental Status    Alan [name]. is a 82 y.o. male.  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 He has been feeling unwell X 2 days.   His wife is at bedside provides history.  She is his decision-maker   Altered Mental Status      Home Medications Prior to Admission medications   Medication Sig Start Date End Date Taking? Authorizing Provider  acetaminophen (TYLENOL) 325 MG tablet Take 2 tablets (650 mg total) by mouth every 6 (six) hours as needed for mild pain, fever or headache (or Fever >/= 101). 03/04/23   Emokpae, Courage, MD  amLODipine (NORVASC) 10 MG tablet Take 10 mg by mouth daily. 04/18/23   [provider]  Blood Glucose Monitoring Suppl (ONE TOUCH ULTRA MINI) w/Device KIT 1 each by Does not apply route 2 (two) times daily. 01/14/21   Dettinger, Elige Radon, MD  Blood Pressure KIT 1 each by Does not apply route daily. 08/16/22   Dettinger, Elige Radon, MD  calcium carbonate (OSCAL) 1500 (600 Ca) MG TABS tablet Take 1,500 mg by mouth 2 (two) times daily with a meal.    [provider]  diclofenac Sodium (VOLTAREN) 1 % GEL Apply 1 application topically 2 (two) times daily as needed (pain).    [provider]  fentaNYL (DURAGESIC) 12 MCG/HR Place 1 patch onto the skin every 3 (three) days.    [provider]  ferrous sulfate (SV IRON) 325 (65 FE) MG tablet Take 1 tablet (325 mg total) by mouth daily with breakfast. 04/02/23   Dettinger, Elige Radon, MD  fluticasone (FLONASE) 50 MCG/ACT nasal spray Place 1 spray into  both nostrils 2 (two) times daily as needed for allergies or rhinitis. 11/05/15   Dettinger, Elige Radon, MD  folic acid (FOLVITE) 1 MG tablet Take 1 tablet (1 mg total) by mouth daily. 05/16/23   Catarina Hartshorn, MD  glucose blood (ONE TOUCH ULTRA TEST) test strip Use to check BG once daily.  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 11/29/22   Doreatha Massed, MD  metFORMIN (GLUCOPHAGE) 1000 MG tablet Take 1 tablet (1,000 mg total) by mouth 2 (two) times daily with a meal. Patient taking differently: Take 1,000 mg by mouth daily. 05/29/22   Dettinger, Elige Radon, MD  metoprolol succinate (TOPROL-XL) 100 MG 24 hr tablet Take 1 tablet (100 mg total) by mouth daily. Take with or immediately following a meal. 01/12/23   Dettinger, Elige Radon, MD  omeprazole (PRILOSEC) 20 MG capsule Take 1 capsule (20 mg total) by mouth daily. 01/12/23   Dettinger, Elige Radon, MD  oxyCODONE (OXY IR/ROXICODONE) 5 MG immediate release tablet Take 5 mg by mouth every 4 (four) hours as needed for severe pain.    [provider]  traZODone (DESYREL) 50 MG tablet Take 0.5-1 tablets (25-50 mg total) by mouth at bedtime as needed for sleep.  03/19/23   Dettinger, Elige Radon, MD      Allergies    Allopurinol and Colchicine    Review of Systems   Review of Systems  Physical Exam Updated Vital Signs BP (!) 98/54   Pulse (!) 142   Temp (!) 101.3 F (38.5 C) (Rectal)   Resp (!) 43   SpO2 93%  Physical Exam Vitals and nursing note reviewed.  Constitutional:      Appearance: He is well-developed. He is ill-appearing.  HENT:     Head: Normocephalic and atraumatic.     Mouth/Throat:     Mouth: Mucous membranes are dry.  Eyes:     Extraocular Movements: Extraocular movements intact.     Conjunctiva/sclera: Conjunctivae normal.     Pupils: Pupils are equal, round, and reactive to  light.  Cardiovascular:     Rate and Rhythm: Regular rhythm. Tachycardia present.     Heart sounds: No murmur heard. Pulmonary:     Effort: Pulmonary effort is normal. No respiratory distress.     Breath sounds: Normal breath sounds.  Abdominal:     Palpations: Abdomen is soft.     Tenderness: There is generalized abdominal tenderness.  Musculoskeletal:        General: No swelling.     Cervical back: Neck supple.  Skin:    General: Skin is warm and dry.     Capillary Refill: Capillary refill takes less than 2 seconds.  Neurological:     General: No focal deficit present.     Mental Status: He is alert.  Psychiatric:        Mood and Affect: Mood normal.     ED Results / Procedures / Treatments   Labs (all labs ordered are listed, but only abnormal results are displayed) Labs Reviewed  COMPREHENSIVE METABOLIC PANEL - Abnormal; Notable for the following components:      Result Value   CO2 17 (*)    Glucose, Bld 173 (*)    BUN 43 (*)    Creatinine, Ser 1.63 (*)    Calcium 6.9 (*)    Total Protein 4.6 (*)    Albumin 2.0 (*)    AST 117 (*)    Alkaline Phosphatase 1,945 (*)    Total Bilirubin 2.5 (*)    GFR, Estimated 42 (*)    All other components within normal limits  LACTIC ACID, PLASMA - Abnormal; Notable for the following components:   Lactic Acid, Venous 5.4 (*)    All other components within normal limits  LACTIC ACID, PLASMA - Abnormal; Notable for the following components:   Lactic Acid, Venous 4.9 (*)    All other components within normal limits  CBC WITH DIFFERENTIAL/PLATELET - Abnormal; Notable for the following components:   RBC 2.89 (*)    Hemoglobin 8.4 (*)    HCT 26.2 (*)    RDW 19.2 (*)    Platelets 29 (*)    nRBC 1.6 (*)    Abs Immature Granulocytes 0.20 (*)    All other components within normal limits  PROTIME-INR - Abnormal; Notable for the following components:   Prothrombin Time 20.5 (*)    INR 1.7 (*)    All other components within normal  limits  APTT - Abnormal; Notable for the following components:   aPTT 39 (*)    All other components within normal limits  I-STAT CHEM 8, ED - Abnormal; Notable for the following components:   BUN 41 (*)    Creatinine, Ser 1.70 (*)  Glucose, Bld 171 (*)    Calcium, Ion 0.96 (*)    TCO2 21 (*)    Hemoglobin 7.5 (*)    HCT 22.0 (*)    All other components within normal limits  RESP PANEL BY RT-PCR (RSV, FLU A&B, COVID)  RVPGX2  CULTURE, BLOOD (ROUTINE X 2)  CULTURE, BLOOD (ROUTINE X 2)  URINE CULTURE  URINALYSIS, W/ REFLEX TO CULTURE (INFECTION SUSPECTED)  CREATININE, SERUM    EKG EKG Interpretation  Date/Time:  Thursday May 24 2023 14:07:52 EDT Ventricular Rate:  142 PR Interval:    QRS Duration: 84 QT Interval:  316 QTC Calculation: 486 R Axis:   33 Text Interpretation: Atrial fibrillation with rapid V-rate Repolarization abnormality, prob rate related Confirmed by Pricilla Loveless [phone]) on 05/17/2023 2:14:23 PM  Radiology CT ABDOMEN PELVIS W CONTRAST  Result Date: 05/15/2023 CLINICAL DATA:  Metastatic prostate cancer. Sepsis, altered mental status. * Tracking Code: BO * EXAM: CT ABDOMEN AND PELVIS WITH CONTRAST TECHNIQUE: Multidetector CT imaging of the abdomen and pelvis was performed using the standard protocol following bolus administration of intravenous contrast. RADIATION DOSE REDUCTION: This exam was performed according to the departmental dose-optimization program which includes automated exposure control, adjustment of the mA and/or kV according to patient size and/or use of iterative reconstruction technique. CONTRAST:  75mL OMNIPAQUE IOHEXOL 300 MG/ML  SOLN COMPARISON:  01/11/2023 FINDINGS: Lower chest: Airspace filling consolidation appearance in the right lower lobe. Passive atelectasis in the left lower lobe. Moderate to large left and small right pleural effusion. Descending thoracic aortic atherosclerosis. Hepatobiliary: Heterogeneous enhancement in the liver  with higher density peripherally, probably related to an unusual distribution of steatosis although hepatitis might also be considered. Cholecystectomy. No biliary dilatation or pneumobilia. Pancreas: Pancreas is somewhat blurred by motion artifact but there continues to be dilatation of the dorsal pancreatic duct up to 5 mm in diameter, not substantially changed from 01/11/2023. 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 Spleen: The spleen measures 11.3 by 7.9 by 11.8 cm (volume = 550 cm^3) and demonstrates multiple new small hypodense lesions which were not present on 01/11/2023, concerning for microabscesses given the clinical context. Splenic involvement would be an unusual pathway for spread of prostate cancer. Adrenals/Urinary Tract: Bilateral thickened adrenal glands, increased from prior. The kidneys appear unremarkable. Foley catheter in the otherwise empty urinary bladder. Stomach/Bowel: Small type 1 hiatal hernia. Suspected varices along the margin of the proximal stomach. Large indirect right inguinal hernia containing multiple loops of small bowel, the bottom of the cecum, and the appendix. Because these loops of bowel extend far down in the distended scrotum and well below the ischial tuberosities, the entirety of the bowel down in the scrotum is not included. Sigmoid colon diverticulosis. Moderate prominence of rectal stool ball. 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 Atherosclerosis is present, including aortoiliac atherosclerotic disease. There is atheromatous plaque proximally in the celiac trunk and SMA without overt occlusion. Reproductive: Brachytherapy seed implants in the indistinctly marginated prostate gland. Other: Small amount of edema along the root of the  mesentery. Mild nonspecific presacral edema. Musculoskeletal: Diffuse patchy bony sclerosis compatible with widespread osseous metastatic disease. Lumbar spondylosis causes mild multilevel foraminal impingement. Large right indirect inguinal hernia containing loops of bowel including small bowel loops and the appendix. A small indirect left inguinal  hernia contains adipose tissue. IMPRESSION: 1. Multiple new small hypodense lesions in the spleen, concerning for microabscesses given the clinical context. 2. Airspace filling consolidation in the right lower lobe, suspicious for pneumonia. 3. Moderate to large left and small right pleural effusion. 4. Heterogeneous enhancement in the liver with higher density peripherally, probably related to an unusual distribution of steatosis although hepatitis might also be considered. 5. 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 6. Large indirect right inguinal hernia containing multiple loops of small bowel, the bottom of the cecum, and the appendix. Because these loops of bowel extend far down in the distended scrotum and well below the ischial tuberosities, the entirety of the bowel down in the scrotum is not included. 7. Sigmoid colon diverticulosis. 8. Small type 1 hiatal hernia. 9. 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 10. Brachytherapy seed implants in the indistinctly marginated prostate gland. 11. Aortic atherosclerosis. Aortic Atherosclerosis (ICD10-I70.0). Electronically Signed   By: Gaylyn Rong M.D.   On: 06/07/2023 17:44   CT Head Wo Contrast  Result Date: 05/28/2023 CLINICAL DATA:  Altered mental status EXAM: CT HEAD WITHOUT CONTRAST TECHNIQUE: Contiguous axial images were obtained from the base of the skull through the vertex without intravenous contrast. RADIATION DOSE REDUCTION: This exam was  performed according to the departmental dose-optimization program which includes automated exposure control, adjustment of the mA and/or kV according to patient size and/or use of iterative reconstruction technique. COMPARISON:  None Available. FINDINGS: Brain: No acute intracranial findings are seen. There are no signs of bleeding within the cranium. Dense calcifications are seen in basal ganglia on both sides. Cortical sulci are prominent. There is decreased density in periventricular white matter. Vascular: There are scattered coarse arterial calcifications. Skull: No acute findings are seen. Sinuses/Orbits: Unremarkable. Other: None. IMPRESSION: No acute intracranial findings are seen. Atrophy. Small-vessel disease. Electronically Signed   By: Ernie Avena M.D.   On: 06/01/2023 17:32   DG Chest Portable 1 View  Result Date: 06/07/2023 CLINICAL DATA:  Altered mental status.  History of prostate cancer EXAM: PORTABLE CHEST 1 VIEW COMPARISON:  X-ray 03/27/2022 and older FINDINGS: Developing small left effusion and adjacent lung opacity. Possible infiltrate. No pneumothorax or edema. Normal cardiopericardial silhouette. Overlapping cardiac leads. Surgical clips in the right upper quadrant. Scattered areas of bony sclerosis identified consistent with known osseous metastatic disease from prostate cancer. IMPRESSION: New small left effusion and adjacent opacity. Possible infiltrate. Recommend follow-up Electronically Signed   By: Karen Kays M.D.   On: 06/06/2023 15:15    Procedures .Critical Care  Performed by: Ma Rings, PA-C Authorized by: Ma Rings, PA-C   Critical care provider statement:    Critical care time (minutes):  30   Critical care was necessary to treat or prevent imminent or life-threatening deterioration of the following conditions:  Sepsis   Critical care was time spent personally by me on the following activities:  Development of treatment plan with patient or  surrogate, discussions with consultants, evaluation of patient's response to treatment, examination of patient, ordering and review of laboratory studies, ordering and review of radiographic studies, ordering and performing treatments and interventions, pulse oximetry, re-evaluation of patient's condition, review of old charts and obtaining history from patient or surrogate   Care discussed with: admitting provider       Medications Ordered in ED Medications  lactated ringers infusion (  Intravenous New Bag/Given 05/28/2023 1849)  ceFEPIme (MAXIPIME) 2 g in sodium chloride 0.9 % 100 mL IVPB (has no administration in time range)  vancomycin (VANCOREADY) IVPB 1250 mg/250 mL (has no administration in time range)  lactated ringers bolus 1,000 mL (0 mLs Intravenous Stopped 05/22/2023 1847)    And  lactated ringers bolus 1,000 mL (0 mLs Intravenous Stopped 06/01/2023 1847)  ceFEPIme (MAXIPIME) 2 g in sodium chloride 0.9 % 100 mL IVPB (0 g Intravenous Stopped 06/04/2023 1506)  acetaminophen (TYLENOL) suppository 650 mg (650 mg Rectal Given by Other 06/09/2023 1433)  vancomycin (VANCOREADY) IVPB 1500 mg/300 mL (0 mg Intravenous Stopped 05/23/2023 1835)  iohexol (OMNIPAQUE) 300 MG/ML solution 75 mL (75 mLs Intravenous Contrast Given 05/14/2023 1703)    ED Course/ Medical Decision Making/ A&P Clinical Course as of 05/31/2023 1933  Thu May 24, 2023  1550 Discussion had at bedside with patient's wife who is his healthcare decision-maker, she wants him to remain DNR but does want IV fluids and antibiotics to treat his likely pneumonia.  Does not want to make him comfort measures only at this time. [CB]  1930 CT ABDOMEN PELVIS W CONTRAST [CB]    Clinical Course User Index [CB] Ma Rings, PA-C                             Medical Decision Making This patient presents to the ED for concern of AMS, fever, hypoxia, this involves an extensive number of treatment options, and is a complaint that carries with it a high  risk of complications and morbidity.  The differential diagnosis includes sepsis, pneumonia, PE, other   Co morbidities that complicate the patient evaluation :   metastatic prostate cancer, afib   Additional history obtained:  Additional history obtained from EMR External records from outside source obtained and reviewed including prior hospital notes   Lab Tests:  I Ordered, and personally interpreted labs.  The pertinent results include: CBC shows no leukocytosis but patient does have bandemia, low platelets, he has elevated lactic acid, repeat slightly improved, CMP shows AKI, decreased CO2,   Imaging Studies ordered:  I ordered imaging studies including Chest Xray, CT head, CT abd/pelvis  I independently visualized and interpreted imaging which showed lower lobe pneumonia on chest x-ray, CT head shows no acute renal abnormalities, CT abdomen pelvis redemonstrates the pneumonia in the lower lungs and has some new densities in the spleen, I agree with the radiologist interpretation-noted possibly microabscesses of the spleen, has history of splenic infarct, unsure if this is related   Cardiac Monitoring: / EKG:  The patient was maintained on a cardiac monitor.  I personally viewed and interpreted the cardiac monitored which showed an underlying rhythm of: Rapid atrial fibrillation   Consultations Obtained:  I requested consultation with the hospitalist,  and discussed lab and imaging findings as well as pertinent plan - they recommend: Admission   Problem List / ED Course / Critical interventions / Medication management  This is secondary to pneumonia-patient has metastatic prostate cancer, he is DNR.  Family is at bedside.  His wife is his primary Management consultant.  They desire the patient to be treated with antibiotics for his pneumonia and sepsis and IV fluids but they do not want any aggressive procedures or treatments otherwise.  They would not want BiPAP, would not want  central line or pressors, they do not want any procedures.  He was given broad-spectrum antibiotics, fluid  bolus, Tylenol.  His vitals improved, we had a family discussion at bedside with myself, family and Dr. Criss Alvine.  Understand that his prognosis is extremely poor and he may, this illness despite antibiotics but they want to try, patient is more alert and denies any pain at this time.  Admitted for further treatment.  Reevaluation of the patient after these medicines showed that the patient improved I have reviewed the patients home medicines and have made adjustments as needed       Amount and/or Complexity of Data Reviewed Labs: ordered. Radiology: ordered. Decision-making details documented in ED Course. ECG/medicine tests: ordered.  Risk OTC drugs. Prescription drug management. Decision regarding hospitalization.           Final Clinical Impression(s) / ED Diagnoses Final diagnoses:  Sepsis without acute organ dysfunction, due to unspecified organism [facility])    Rx / DC Orders ED Discharge Orders     None         Ma Rings, PA-C 05/23/2023 2308    Ma Rings, PA-C 06/02/2023 2309    Pricilla Loveless, MD 05/20/2023 [phone]

## 2023-05-24 NOTE — Sepsis Progress Note (Signed)
Secure chat with bedside nurse and verified that blood cultures were drawn prior to the start of antibiotics.

## 2023-05-24 NOTE — Assessment & Plan Note (Addendum)
.    Currently a hospice patient.  Prostate cancer metastatic to bone.  CT today shows diffuse patchy bony sclerosis compatible with widespread osseous metastatic disease.  -Saw Dr. Ellin Saba 02/21/2023--no longer candidate for active therapy/chemotherapy

## 2023-05-24 NOTE — Assessment & Plan Note (Signed)
Creatinine 1.6, baseline 0.8-1.1.  In the setting of severe sepsis, hypotension.

## 2023-05-24 NOTE — Assessment & Plan Note (Addendum)
Rate up to 140s, EKG shows atrial fibrillation.  Not on anticoagulation due to history of acute blood loss anemias, recent hospitalization requiring 4 units PRBC.  Home medications metoprolol 100 mg daily.  Systolic down to 08M, currently improved after 3 L bolus. -Hold home metoprolol -Will start amiodarone drip, I have talked to spouse about starting this medication especially as patient has not and will not be on anticoagulation.

## 2023-05-24 NOTE — Assessment & Plan Note (Addendum)
Meeting severe sepsis arterial with fever of 102, tachycardia heart rate 117-152, tachypnea respiratory rate 29-44, with evidence of multiple endorgan dysfunction, significant lactic acidosis of 5.4 > 4.9, encephalopathy, AKI, thrombocytopenia.  Etiology likely secondary to pneumonia, and possibly splenic microabscesses seen on CT. COVID, influenza negative. -Continue broad-spectrum antibiotics IV vancomycin cefepime and metronidazole -Follow up blood cultures -Follow-up UA and urine cultures -3 Liter LR bolus given, continue N/s 100cc/hr x 15hrs -Patient is a hospice patient.  ED provider, and myself have had extensive discussion with patient's family- spouse at bedside about expectation considering severity of illness, and goals of care. They are reasonable want IV antibiotics, fluids, and treating the treatable without aggressive interventions like central lines, and I confirmed that patient's is DNR.

## 2023-05-24 NOTE — ED Triage Notes (Signed)
PT BIB [facility] for altered mental status, reported pt not drinking since yesterday.  Pt has hospice at home due to bone/ prostate CA.  Pt arrived with foley catheter in place and NRB.

## 2023-05-24 NOTE — Assessment & Plan Note (Signed)
Chest x-ray shows possible infiltrate, CT abdomen and pelvis with contrast shows airspace filling consolidation in the right lower lobe suspicious for pneumonia. - Broad spectrum antibiotics

## 2023-05-24 NOTE — ED Notes (Signed)
ED Provider at bedside. 

## 2023-05-24 NOTE — Progress Notes (Addendum)
Pharmacy Antibiotic Note  Alan [name]. is a 82 y.o. male admitted on 05/24/2023 with UTI/sepsis  Pharmacy has been consulted for Cefepime dosing.  Plan: Vancomycin 1500 mg IV x 1 dose. Vancomycin 1250 mg IV every 48 hours. Cefepime 2gm IV q12h F/U cxs and clinical progress Monitor V/S, labs  Temp (24hrs), Avg:102 F (38.9 C), Min:102 F (38.9 C), Max:102 F (38.9 C)  Recent Labs  Lab 05/24/23 1436  CREATININE 1.70*    Estimated Creatinine Clearance: 30 mL/min (A) (by C-G formula based on SCr of 1.7 mg/dL (H)).    Allergies  Allergen Reactions   Allopurinol Other (See Comments)    Significantly decreased WBC's   Colchicine Other (See Comments)    Significantly decreased WBC's    Antimicrobials this admission: cefepime 6/13 >>  Vanco 6/13 >>  Microbiology results: 6/13 BCx: pending 6/13 UCx: pending   Thank you for allowing pharmacy to be a part of this patient's care.  Judeth Cornfield, PharmD Clinical Pharmacist 05/24/2023 4:02 PM

## 2023-05-24 NOTE — Plan of Care (Signed)
Problem: Education: Goal: Knowledge of General Education information will improve Description: Including pain rating scale, medication(s)/side effects and non-pharmacologic comfort measures 05/26/2023 2328 by Corrie Mckusick, RN Outcome: Progressing 05/14/2023 2328 by Corrie Mckusick, RN Outcome: Progressing   Problem: Health Behavior/Discharge Planning: Goal: Ability to manage health-related needs will improve 05/30/2023 2328 by Corrie Mckusick, RN Outcome: Progressing 05/22/2023 2328 by Corrie Mckusick, RN Outcome: Progressing   Problem: Clinical Measurements: Goal: Ability to maintain clinical measurements within normal limits will improve 06/08/2023 2328 by Corrie Mckusick, RN Outcome: Progressing 06/02/2023 2328 by Corrie Mckusick, RN Outcome: Progressing Goal: Will remain free from infection 05/27/2023 2328 by Corrie Mckusick, RN Outcome: Progressing 06/01/2023 2328 by Corrie Mckusick, RN Outcome: Progressing Goal: Diagnostic test results will improve 06/03/2023 2328 by Corrie Mckusick, RN Outcome: Progressing 05/22/2023 2328 by Corrie Mckusick, RN Outcome: Progressing Goal: Respiratory complications will improve 05/14/2023 2328 by Corrie Mckusick, RN Outcome: Progressing 06/06/2023 2328 by Corrie Mckusick, RN Outcome: Progressing Goal: Cardiovascular complication will be avoided 05/12/2023 2328 by Corrie Mckusick, RN Outcome: Progressing 05/26/2023 2328 by Corrie Mckusick, RN Outcome: Progressing   Problem: Activity: Goal: Risk for activity intolerance will decrease 05/14/2023 2328 by Corrie Mckusick, RN Outcome: Progressing 05/14/2023 2328 by Corrie Mckusick, RN Outcome: Progressing   Problem: Nutrition: Goal: Adequate nutrition will be maintained 05/27/2023 2328 by Corrie Mckusick, RN Outcome: Progressing 05/19/2023 2328 by Corrie Mckusick, RN Outcome: Progressing   Problem: Coping: Goal: Level of anxiety will decrease 05/22/2023  2328 by Corrie Mckusick, RN Outcome: Progressing 06/08/2023 2328 by Corrie Mckusick, RN Outcome: Progressing   Problem: Elimination: Goal: Will not experience complications related to bowel motility 05/25/2023 2328 by Corrie Mckusick, RN Outcome: Progressing 06/02/2023 2328 by Corrie Mckusick, RN Outcome: Progressing Goal: Will not experience complications related to urinary retention 05/23/2023 2328 by Corrie Mckusick, RN Outcome: Progressing 05/12/2023 2328 by Corrie Mckusick, RN Outcome: Progressing   Problem: Pain Managment: Goal: General experience of comfort will improve 06/08/2023 2328 by Corrie Mckusick, RN Outcome: Progressing 06/05/2023 2328 by Corrie Mckusick, RN Outcome: Progressing   Problem: Safety: Goal: Ability to remain free from injury will improve 05/21/2023 2328 by Corrie Mckusick, RN Outcome: Progressing 05/14/2023 2328 by Corrie Mckusick, RN Outcome: Progressing   Problem: Skin Integrity: Goal: Risk for impaired skin integrity will decrease 05/25/2023 2328 by Corrie Mckusick, RN Outcome: Progressing 06/05/2023 2328 by Corrie Mckusick, RN Outcome: Progressing   Problem: Education: Goal: Knowledge of disease or condition will improve 05/25/2023 2328 by Corrie Mckusick, RN Outcome: Progressing 05/28/2023 2328 by Corrie Mckusick, RN Outcome: Progressing Goal: Understanding of medication regimen will improve 06/07/2023 2328 by Corrie Mckusick, RN Outcome: Progressing 05/17/2023 2328 by Corrie Mckusick, RN Outcome: Progressing Goal: Individualized Educational Video(s) 05/30/2023 2328 by Corrie Mckusick, RN Outcome: Progressing 05/26/2023 2328 by Corrie Mckusick, RN Outcome: Progressing   Problem: Activity: Goal: Ability to tolerate increased activity will improve 05/26/2023 2328 by Corrie Mckusick, RN Outcome: Progressing 05/15/2023 2328 by Corrie Mckusick, RN Outcome: Progressing   Problem: Cardiac: Goal: Ability to  achieve and maintain adequate cardiopulmonary perfusion will improve 05/31/2023 2328 by Corrie Mckusick, RN Outcome: Progressing 05/22/2023 2328 by Corrie Mckusick, RN Outcome: Progressing   Problem: Health Behavior/Discharge Planning: Goal: Ability to safely manage health-related needs after discharge will improve 05/17/2023 2328 by Corrie Mckusick,  RN Outcome: Progressing 05/12/2023 2328 by Corrie Mckusick, RN Outcome: Progressing   Problem: Education: Goal: Ability to describe self-care measures that may prevent or decrease complications (Diabetes Survival Skills Education) will improve 05/18/2023 2328 by Corrie Mckusick, RN Outcome: Progressing 06/07/2023 2328 by Corrie Mckusick, RN Outcome: Progressing Goal: Individualized Educational Video(s) 05/23/2023 2328 by Corrie Mckusick, RN Outcome: Progressing 05/21/2023 2328 by Corrie Mckusick, RN Outcome: Progressing   Problem: Coping: Goal: Ability to adjust to condition or change in health will improve 06/08/2023 2328 by Corrie Mckusick, RN Outcome: Progressing 05/28/2023 2328 by Corrie Mckusick, RN Outcome: Progressing   Problem: Fluid Volume: Goal: Ability to maintain a balanced intake and output will improve 06/04/2023 2328 by Corrie Mckusick, RN Outcome: Progressing 05/23/2023 2328 by Corrie Mckusick, RN Outcome: Progressing   Problem: Health Behavior/Discharge Planning: Goal: Ability to identify and utilize available resources and services will improve 05/16/2023 2328 by Corrie Mckusick, RN Outcome: Progressing 05/31/2023 2328 by Corrie Mckusick, RN Outcome: Progressing Goal: Ability to manage health-related needs will improve 05/28/2023 2328 by Corrie Mckusick, RN Outcome: Progressing 06/01/2023 2328 by Corrie Mckusick, RN Outcome: Progressing   Problem: Metabolic: Goal: Ability to maintain appropriate glucose levels will improve 05/25/2023 2328 by Corrie Mckusick, RN Outcome:  Progressing 06/09/2023 2328 by Corrie Mckusick, RN Outcome: Progressing   Problem: Nutritional: Goal: Maintenance of adequate nutrition will improve 06/07/2023 2328 by Corrie Mckusick, RN Outcome: Progressing 05/16/2023 2328 by Corrie Mckusick, RN Outcome: Progressing Goal: Progress toward achieving an optimal weight will improve 05/26/2023 2328 by Corrie Mckusick, RN Outcome: Progressing 05/16/2023 2328 by Corrie Mckusick, RN Outcome: Progressing   Problem: Skin Integrity: Goal: Risk for impaired skin integrity will decrease 06/05/2023 2328 by Corrie Mckusick, RN Outcome: Progressing 05/17/2023 2328 by Corrie Mckusick, RN Outcome: Progressing   Problem: Tissue Perfusion: Goal: Adequacy of tissue perfusion will improve 06/09/2023 2328 by Corrie Mckusick, RN Outcome: Progressing 05/19/2023 2328 by Corrie Mckusick, RN Outcome: Progressing   Problem: Education: Goal: Ability to describe self-care measures that may prevent or decrease complications (Diabetes Survival Skills Education) will improve Outcome: Progressing Goal: Individualized Educational Video(s) Outcome: Progressing   Problem: Coping: Goal: Ability to adjust to condition or change in health will improve Outcome: Progressing   Problem: Fluid Volume: Goal: Ability to maintain a balanced intake and output will improve Outcome: Progressing   Problem: Health Behavior/Discharge Planning: Goal: Ability to identify and utilize available resources and services will improve Outcome: Progressing Goal: Ability to manage health-related needs will improve Outcome: Progressing   Problem: Metabolic: Goal: Ability to maintain appropriate glucose levels will improve Outcome: Progressing   Problem: Education: Goal: Knowledge of disease or condition will improve Outcome: Progressing Goal: Understanding of medication regimen will improve Outcome: Progressing Goal: Individualized Educational Video(s) Outcome:  Progressing   Problem: Activity: Goal: Ability to tolerate increased activity will improve Outcome: Progressing   Problem: Cardiac: Goal: Ability to achieve and maintain adequate cardiopulmonary perfusion will improve Outcome: Progressing   Problem: Health Behavior/Discharge Planning: Goal: Ability to safely manage health-related needs after discharge will improve Outcome: Progressing

## 2023-05-25 ENCOUNTER — Inpatient Hospital Stay (HOSPITAL_COMMUNITY): Payer: Medicare Other

## 2023-05-25 DIAGNOSIS — N179 Acute kidney failure, unspecified: Secondary | ICD-10-CM | POA: Diagnosis not present

## 2023-05-25 DIAGNOSIS — G9341 Metabolic encephalopathy: Secondary | ICD-10-CM | POA: Diagnosis not present

## 2023-05-25 DIAGNOSIS — D696 Thrombocytopenia, unspecified: Secondary | ICD-10-CM | POA: Diagnosis not present

## 2023-05-25 DIAGNOSIS — A419 Sepsis, unspecified organism: Secondary | ICD-10-CM | POA: Diagnosis not present

## 2023-05-25 LAB — BASIC METABOLIC PANEL
Anion gap: 9 (ref 5–15)
BUN: 46 mg/dL — ABNORMAL HIGH (ref 8–23)
CO2: 19 mmol/L — ABNORMAL LOW (ref 22–32)
Calcium: 6.9 mg/dL — ABNORMAL LOW (ref 8.9–10.3)
Chloride: 109 mmol/L (ref 98–111)
Creatinine, Ser: 1.29 mg/dL — ABNORMAL HIGH (ref 0.61–1.24)
GFR, Estimated: 56 mL/min — ABNORMAL LOW (ref >60)
Glucose, Bld: 141 mg/dL — ABNORMAL HIGH (ref 70–99)
Potassium: 3.8 mmol/L (ref 3.5–5.1)
Sodium: 137 mmol/L (ref 135–145)

## 2023-05-25 LAB — URINE CULTURE: Culture: NO GROWTH

## 2023-05-25 LAB — GLUCOSE, CAPILLARY
Glucose-Capillary: 116 mg/dL — ABNORMAL HIGH (ref 70–99)
Glucose-Capillary: 131 mg/dL — ABNORMAL HIGH (ref 70–99)
Glucose-Capillary: 132 mg/dL — ABNORMAL HIGH (ref 70–99)
Glucose-Capillary: 139 mg/dL — ABNORMAL HIGH (ref 70–99)
Glucose-Capillary: 163 mg/dL — ABNORMAL HIGH (ref 70–99)
Glucose-Capillary: 165 mg/dL — ABNORMAL HIGH (ref 70–99)

## 2023-05-25 LAB — CBC
HCT: 23.5 % — ABNORMAL LOW (ref 39.0–52.0)
Hemoglobin: 7.5 g/dL — ABNORMAL LOW (ref 13.0–17.0)
MCH: 28.5 pg (ref 26.0–34.0)
MCHC: 31.9 g/dL (ref 30.0–36.0)
MCV: 89.4 fL (ref 80.0–100.0)
Platelets: 18 10*3/uL — CL (ref 150–400)
RBC: 2.63 MIL/uL — ABNORMAL LOW (ref 4.22–5.81)
RDW: 19.8 % — ABNORMAL HIGH (ref 11.5–15.5)
WBC: 3.5 10*3/uL — ABNORMAL LOW (ref 4.0–10.5)
nRBC: 1.1 % — ABNORMAL HIGH (ref 0.0–0.2)

## 2023-05-25 LAB — CULTURE, BLOOD (ROUTINE X 2)

## 2023-05-25 MED ORDER — FOLIC ACID 1 MG PO TABS
1.0000 mg | ORAL_TABLET | Freq: Every day | ORAL | Status: DC
  Administered 2023-05-25: 1 mg via ORAL
  Filled 2023-05-25 (×2): qty 1

## 2023-05-25 MED ORDER — ENSURE ENLIVE PO LIQD
237.0000 mL | Freq: Two times a day (BID) | ORAL | Status: DC
  Administered 2023-05-25 (×2): 237 mL via ORAL

## 2023-05-25 MED ORDER — ORAL CARE MOUTH RINSE: 15.0000 mL | OROMUCOSAL | Status: DC | PRN

## 2023-05-25 NOTE — Plan of Care (Signed)
Problem: Education: Goal: Knowledge of General Education information will improve Description: Including pain rating scale, medication(s)/side effects and non-pharmacologic comfort measures Outcome: Progressing   Problem: Health Behavior/Discharge Planning: Goal: Ability to manage health-related needs will improve Outcome: Progressing   Problem: Clinical Measurements: Goal: Ability to maintain clinical measurements within normal limits will improve Outcome: Progressing Goal: Will remain free from infection Outcome: Progressing Goal: Diagnostic test results will improve Outcome: Progressing Goal: Respiratory complications will improve Outcome: Progressing Goal: Cardiovascular complication will be avoided Outcome: Progressing   Problem: Activity: Goal: Risk for activity intolerance will decrease Outcome: Progressing   Problem: Nutrition: Goal: Adequate nutrition will be maintained Outcome: Progressing   Problem: Coping: Goal: Level of anxiety will decrease Outcome: Progressing   Problem: Elimination: Goal: Will not experience complications related to bowel motility Outcome: Progressing Goal: Will not experience complications related to urinary retention Outcome: Progressing   Problem: Pain Managment: Goal: General experience of comfort will improve Outcome: Progressing   Problem: Safety: Goal: Ability to remain free from injury will improve Outcome: Progressing   Problem: Skin Integrity: Goal: Risk for impaired skin integrity will decrease Outcome: Progressing   Problem: Education: Goal: Knowledge of disease or condition will improve Outcome: Progressing Goal: Understanding of medication regimen will improve Outcome: Progressing Goal: Individualized Educational Video(s) Outcome: Progressing   Problem: Activity: Goal: Ability to tolerate increased activity will improve Outcome: Progressing   Problem: Cardiac: Goal: Ability to achieve and maintain  adequate cardiopulmonary perfusion will improve Outcome: Progressing   Problem: Health Behavior/Discharge Planning: Goal: Ability to safely manage health-related needs after discharge will improve Outcome: Progressing   Problem: Education: Goal: Ability to describe self-care measures that may prevent or decrease complications (Diabetes Survival Skills Education) will improve Outcome: Progressing Goal: Individualized Educational Video(s) Outcome: Progressing   Problem: Coping: Goal: Ability to adjust to condition or change in health will improve Outcome: Progressing   Problem: Fluid Volume: Goal: Ability to maintain a balanced intake and output will improve Outcome: Progressing   Problem: Health Behavior/Discharge Planning: Goal: Ability to identify and utilize available resources and services will improve Outcome: Progressing Goal: Ability to manage health-related needs will improve Outcome: Progressing   Problem: Metabolic: Goal: Ability to maintain appropriate glucose levels will improve Outcome: Progressing   Problem: Nutritional: Goal: Maintenance of adequate nutrition will improve Outcome: Progressing Goal: Progress toward achieving an optimal weight will improve Outcome: Progressing   Problem: Skin Integrity: Goal: Risk for impaired skin integrity will decrease Outcome: Progressing   Problem: Tissue Perfusion: Goal: Adequacy of tissue perfusion will improve Outcome: Progressing   Problem: Education: Goal: Ability to describe self-care measures that may prevent or decrease complications (Diabetes Survival Skills Education) will improve Outcome: Progressing Goal: Individualized Educational Video(s) Outcome: Progressing   Problem: Coping: Goal: Ability to adjust to condition or change in health will improve Outcome: Progressing   Problem: Fluid Volume: Goal: Ability to maintain a balanced intake and output will improve Outcome: Progressing   Problem:  Health Behavior/Discharge Planning: Goal: Ability to identify and utilize available resources and services will improve Outcome: Progressing Goal: Ability to manage health-related needs will improve Outcome: Progressing   Problem: Metabolic: Goal: Ability to maintain appropriate glucose levels will improve Outcome: Progressing   Problem: Education: Goal: Knowledge of disease or condition will improve Outcome: Progressing Goal: Understanding of medication regimen will improve Outcome: Progressing Goal: Individualized Educational Video(s) Outcome: Progressing   Problem: Activity: Goal: Ability to tolerate increased activity will improve Outcome: Progressing   Problem: Cardiac: Goal: Ability  to achieve and maintain adequate cardiopulmonary perfusion will improve Outcome: Progressing   Problem: Health Behavior/Discharge Planning: Goal: Ability to safely manage health-related needs after discharge will improve Outcome: Progressing

## 2023-05-25 NOTE — Hospital Course (Signed)
82 year old male with a history of metastatic prostate cancer to bone, hypertrophic cardiomyopathy, diabetes mellitus type 2, hypertension, hyperlipidemia, paroxysmal atrial fibrillation, right lower extremity DVT presenting with Patient is currently hospice patient.  He was brought to the ED with complaints of altered mental status over the past 2 days.  His last meal was about 2 days ago, he has not eaten since or drunk since then and has been confused.  In the emergency department, the patient was somnolent with his eyes closed.  He was able to open his eyes with touch but unable to provide any history.  Spouse denies any vomiting, diarrhea, hematochezia, hematemesis.  He has dark stools chronically. Recent hospitalization 6/3 to 05/15/2023 for symptomatic acute blood loss anemia, required 4 units PRBC, patient declined aggressive care endoscopy.   ED Course: Febrile to 102, tachycardic heart rate initially up to 150s improved to 120s, tachypneic heart rate 29-49.  Hypotensive down to 73/45.  WBC 4.9.  Lactic acidosis 5.4 > 4.9.  Cr elevated 1.63.  Platelets low at 29. Blood Cultures obtained Head CT negative for acute abnormality. Abdominal CT-stain for microabscesses in the spleen, consolidation suspicious for pneumonia, moderate to large left and small right pleural effusion, loops of bowel extending further down in distended scrotum well below the ischial tuberosities. IV Vanco cefepime and metronidazole started.

## 2023-05-25 NOTE — Progress Notes (Signed)
PROGRESS NOTE  [name]. WUJ:[id number] DOB: 09-12-1941 DOA: 05/17/2023 PCP: Dettinger, Elige Radon, MD  Brief History:  82 year old male with a history of metastatic prostate cancer to bone, hypertrophic cardiomyopathy, diabetes mellitus type 2, hypertension, hyperlipidemia, paroxysmal atrial fibrillation, right lower extremity DVT presenting with Patient is currently hospice patient.  He was brought to the ED with complaints of altered mental status over the past 2 days.  His last meal was about 2 days ago, he has not eaten since or drunk since then and has been confused.  In the emergency department, the patient was somnolent with his eyes closed.  He was able to open his eyes with touch but unable to provide any history.  Spouse denies any vomiting, diarrhea, hematochezia, hematemesis.  He has dark stools chronically. Recent hospitalization 6/3 to 05/15/2023 for symptomatic acute blood loss anemia, required 4 units PRBC, patient declined aggressive care endoscopy.   ED Course: Febrile to 102, tachycardic heart rate initially up to 150s improved to 120s, tachypneic heart rate 29-49.  Hypotensive down to 73/45.  WBC 4.9.  Lactic acidosis 5.4 > 4.9.  Cr elevated 1.63.  Platelets low at 29. Blood Cultures obtained Head CT negative for acute abnormality. Abdominal CT-stain for microabscesses in the spleen, consolidation suspicious for pneumonia, moderate to large left and small right pleural effusion, loops of bowel extending further down in distended scrotum well below the ischial tuberosities. IV Vanco cefepime and metronidazole started.   Assessment/Plan: Severe sepsis (HCC) Meeting severe sepsis arterial with fever of 102, tachycardia heart rate 117-152, tachypnea respiratory rate 29-44, with evidence of multiple endorgan dysfunction, significant lactic acidosis of 5.4 > 4.9, encephalopathy, AKI, thrombocytopenia.  Etiology likely secondary to pneumonia, and possibly splenic  microabscesses seen on CT. COVID, influenza negative. -Continue broad-spectrum antibiotics IV vancomycin cefepime and metronidazole -Follow up blood cultures -Follow-up UA and urine cultures -3 Liter LR bolus given, continue N/s 100cc/hr x 15hrs -Patient is a hospice patient.  ED provider, and myself have had extensive discussion with patient's family- spouse at bedside about expectation considering severity of illness, and goals of care. They are reasonable want IV antibiotics, fluids, and treating the treatable without aggressive interventions like central lines, and I confirmed that patient's is DNR.  Lobar Pneumonia -noted on CT chest -continue vanc, cefepime for now  Pleural effusion -request right thora -discussed with spouse and she consents after discussion of risks, benefits, alternatives   Thrombocytopenia (HCC) Platelets 29, last checked 9 days ago platelets were 65, gradual decline over the past month from 160s. -acute drop in part due to acute infection -Check B12--879  Paroxysmal Atrial fibrillation with RVR (HCC) -Rate up to 140s,  -personally reviewed EKG-atrial fibrillation, nonsp STT changes.   -Not on anticoagulation due to history of acute blood loss anemias, recent hospitalization requiring 4 units PRBC.  - Home medications metoprolol 100 mg daily.  Systolic down to 95A, currently improved after 3 L bolus. -Hold home metoprolol -started amiodarone drip   Metastatic prostate cancer to bone -CT abdomen and pelvis on 01/11/2023 shows progression of his disease.   -Saw Dr. 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 Verdis Prime -Supportive care at this point -Continue metoprolol succinate   Essential hypertension -Holding amlodipine -holding metoprolol due to hypotension   Severe malnutrition -continue Ensure Plus          Family Communication: spouse updated 6/14  Consultants:  palliative  Code Status:  DNR--confirmed with spouse  DVT Prophylaxis:  SCDs   Procedures: As Listed in Progress Note Above  Antibiotics: Vanc 6/13>> Cefepime 6/13>. Metronidazole 6/13>      Subjective: Patient denies fevers, chills, headache, chest pain, dyspnea, nausea, vomiting, diarrhea, abdominal pain, dysuria, hematuria, hematochezia, and melena.   Objective: Vitals:   05/25/23 0800 05/25/23 0807 05/25/23 0900 05/25/23 0903  BP: (!) 144/72  (!) 104/56   Pulse: 60  (!) 56 73  Resp: (!) 38  (!) 28 (!) 32  Temp:  98.2 F (36.8 C)    TempSrc:  Axillary    SpO2: 95%  98% 97%  Weight:      Height:        Intake/Output Summary (Last 24 hours) at 05/25/2023 0954 Last data filed at 05/25/2023 0846 Gross per 24 hour  Intake 5062.39 ml  Output 350 ml  Net 4712.39 ml   Weight change:  Exam:  General:  Pt is alert, follows commands appropriately, not in acute distress HEENT: No icterus, No thrush, No neck mass, Circle/AT Cardiovascular: RRR, S1/S2, no rubs, no gallops Respiratory: bibasilar rales.  No wheeze Abdomen: Soft/+BS, non tender, non distended, no guarding Extremities: No edema, No lymphangitis, No petechiae, No rashes, no synovitis   Data Reviewed: I have personally reviewed following labs and imaging studies Basic Metabolic Panel: Recent Labs  Lab  05/16/2023 1436 05/28/2023 1438 05/25/23 0508  NA 139 139 137  K 4.3 4.2 3.8  CL 107 108 109  CO2  --  17* 19*  GLUCOSE 171* 173* 141*  BUN 41* 43* 46*  CREATININE 1.70* 1.63* 1.29*  CALCIUM  --  6.9* 6.9*   Liver Function Tests: Recent Labs  Lab 05/16/2023 1438  AST 117*  ALT 21  ALKPHOS 1,945*  BILITOT 2.5*  PROT 4.6*  ALBUMIN 2.0*   No results for input(s): "LIPASE", "AMYLASE" in the last 168 hours. No results for input(s): "AMMONIA" in the last 168 hours. Coagulation Profile: Recent Labs  Lab 05/16/2023 1438  INR 1.7*   CBC: Recent Labs  Lab 05/15/2023 1436 05/27/2023 1438 05/25/23 0508  WBC  --  4.9 3.5*  NEUTROABS  --  2.8  --   HGB 7.5* 8.4* 7.5*  HCT 22.0* 26.2* 23.5*  MCV  --  90.7 89.4  PLT  --  29* 18*   Cardiac Enzymes: No results for input(s): "CKTOTAL", "CKMB", "CKMBINDEX", "TROPONINI" in the last 168 hours. BNP: Invalid input(s): "POCBNP" CBG: Recent Labs  Lab 05/31/2023 2348 05/25/23 0402 05/25/23 0743  GLUCAP 153* 132* 116*   HbA1C: No results for input(s): "HGBA1C" in the last 72 hours. Urine analysis:    Component Value Date/Time   COLORURINE AMBER (A) 06/08/2023 2049   APPEARANCEUR CLOUDY (A) 05/25/2023 2049   APPEARANCEUR Clear 08/31/2022 1551   LABSPEC 1.032 (H) 05/23/2023 2049   PHURINE 5.0 05/14/2023 2049   GLUCOSEU NEGATIVE 05/19/2023 2049   HGBUR LARGE (A) 05/31/2023 2049   BILIRUBINUR NEGATIVE 05/22/2023  2049   BILIRUBINUR Negative 08/31/2022 1551   KETONESUR NEGATIVE 05/30/2023 2049   PROTEINUR 100 (A) 05/22/2023 2049   NITRITE NEGATIVE 06/10/2023 2049   LEUKOCYTESUR TRACE (A) 06/04/2023 2049   Sepsis Labs: @LABRCNTIP (procalcitonin:4,lacticidven:4) ) Recent Results (from the past 240 hour(s))  Culture, blood (Routine x 2)     Status: None (Preliminary result)   Collection Time: 05/23/2023  2:38 PM   Specimen: BLOOD  Result Value Ref Range Status   Specimen Description BLOOD BLOOD RIGHT HAND  Final   Special Requests    Final    BOTTLES DRAWN AEROBIC AND ANAEROBIC Blood Culture adequate volume   Culture   Final    NO GROWTH < 24 HOURS Performed at [facility], [address] [place], [place] [address]    Report Status PENDING  Incomplete  Culture, blood (Routine x 2)     Status: None (Preliminary result)   Collection Time: 05/20/2023  2:38 PM   Specimen: BLOOD  Result Value Ref Range Status   Specimen Description BLOOD BLOOD LEFT ARM  Final   Special Requests   Final    BOTTLES DRAWN AEROBIC AND ANAEROBIC Blood Culture adequate volume   Culture   Final    NO GROWTH < 24 HOURS Performed at [facility], [address], [place], [place] [address]    Report Status PENDING  Incomplete  Resp panel by RT-PCR (RSV, Flu A&B, Covid) Anterior Nasal Swab     Status: None   Collection Time: 06/09/2023  3:03 PM   Specimen: Anterior Nasal Swab  Result Value Ref Range Status   SARS Coronavirus 2 by RT PCR NEGATIVE NEGATIVE Final    Comment: (NOTE) SARS-CoV-2 target nucleic acids are NOT DETECTED.  The SARS-CoV-2 RNA is generally detectable in upper respiratory specimens during the acute phase of infection. The lowest concentration of SARS-CoV-2 viral copies this assay can detect is 138 copies/mL. A negative result does not preclude SARS-Cov-2 infection and should not be used as the sole basis for treatment or other patient management decisions. A negative result may occur with  improper specimen collection/handling, submission of specimen other than nasopharyngeal swab, presence of viral mutation(s) within the areas targeted by this assay, and inadequate number of viral copies(<138 copies/mL). A negative result must be combined with clinical observations, patient history, and epidemiological information. The expected result is Negative.  Fact Sheet for Patients:  BloggerCourse.com  Fact Sheet for Healthcare Providers:  SeriousBroker.it  This test is no t  yet approved or cleared by the [place] FDA and  has been authorized for detection and/or diagnosis of SARS-CoV-2 by FDA under an Emergency Use Authorization (EUA). This EUA will remain  in effect (meaning this test can be used) for the duration of the COVID-19 declaration under Section 564(b)(1) of the Act, 21 U.S.C.section 360bbb-3(b)(1), unless the authorization is terminated  or revoked sooner.       Influenza A by PCR NEGATIVE NEGATIVE Final   Influenza B by PCR NEGATIVE NEGATIVE Final    Comment: (NOTE) The Xpert Xpress SARS-CoV-2/FLU/RSV plus assay is intended as an aid in the diagnosis of influenza from Nasopharyngeal swab specimens and should not be used as a sole basis for treatment. Nasal washings and aspirates are unacceptable for Xpert Xpress SARS-CoV-2/FLU/RSV testing.  Fact Sheet for Patients: BloggerCourse.com  Fact Sheet for Healthcare Providers: SeriousBroker.it  This test is not yet approved or cleared by the [place] FDA and has been authorized for detection and/or diagnosis of SARS-CoV-2 by  FDA under an Emergency Use Authorization (EUA). This EUA will remain in effect (meaning this test can be used) for the duration of the COVID-19 declaration under Section 564(b)(1) of the Act, 21 U.S.C. section 360bbb-3(b)(1), unless the authorization is terminated or revoked.     Resp Syncytial Virus by PCR NEGATIVE NEGATIVE Final    Comment: (NOTE) Fact Sheet for Patients: BloggerCourse.com  Fact Sheet for Healthcare Providers: SeriousBroker.it  This test is not yet approved or cleared by the [place] FDA and has been authorized for detection and/or diagnosis of SARS-CoV-2 by FDA under an Emergency Use Authorization (EUA). This EUA will remain in effect (meaning this test can be used) for the duration of the COVID-19 declaration under Section 564(b)(1)  of the Act, 21 U.S.C. section 360bbb-3(b)(1), unless the authorization is terminated or revoked.  Performed at [facility], [address] [place], [place] [address]   MRSA Next Gen by PCR, Nasal     Status: None   Collection Time: 05/21/2023  7:58 PM   Specimen: Nasal Mucosa; Nasal Swab  Result Value Ref Range Status   MRSA by PCR Next Gen NOT DETECTED NOT DETECTED Final    Comment: (NOTE) The GeneXpert MRSA Assay (FDA approved for NASAL specimens only), is one component of a comprehensive MRSA colonization surveillance program. It is not intended to diagnose MRSA infection nor to guide or monitor treatment for MRSA infections. Test performance is not FDA approved in patients less than 27 years old. Performed at [facility], [address], [place], [place] [address]      Scheduled Meds:  Chlorhexidine Gluconate Cloth  6 each Topical Daily   feeding supplement  237 mL Oral BID BM   insulin aspart  0-9 Units Subcutaneous Q4H   Continuous Infusions:  sodium chloride 100 mL/hr at 05/25/23 0411   ceFEPime (MAXIPIME) IV 2 g (05/25/23 0156)   metronidazole 500 mg (05/25/23 0846)   [START ON 05/26/2023] vancomycin      Procedures/Studies: CT ABDOMEN PELVIS W CONTRAST  Result Date: 05/22/2023 CLINICAL DATA:  Metastatic prostate cancer. Sepsis, altered mental status. * Tracking Code: BO * EXAM: CT ABDOMEN AND PELVIS WITH CONTRAST TECHNIQUE: Multidetector CT imaging of the abdomen and pelvis was performed using the standard protocol following bolus administration of intravenous contrast. RADIATION DOSE REDUCTION: This exam was performed according to the departmental dose-optimization program which includes automated exposure control, adjustment of the mA and/or kV according to patient size and/or use of iterative reconstruction technique. CONTRAST:  75mL OMNIPAQUE IOHEXOL 300 MG/ML  SOLN COMPARISON:  01/11/2023 FINDINGS: Lower chest: Airspace filling consolidation appearance in the right  lower lobe. Passive atelectasis in the left lower lobe. Moderate to large left and small right pleural effusion. Descending thoracic aortic atherosclerosis. Hepatobiliary: Heterogeneous enhancement in the liver with higher density peripherally, probably related to an unusual distribution of steatosis although hepatitis might also be considered. Cholecystectomy. No biliary dilatation or pneumobilia. Pancreas: Pancreas is somewhat blurred by motion artifact but there continues to be dilatation of the dorsal pancreatic duct up to 5 mm in diameter, not substantially changed from 01/11/2023. 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 Spleen: The spleen measures 11.3 by 7.9 by 11.8 cm (volume = 550 cm^3) and demonstrates multiple new small hypodense lesions which were not present on 01/11/2023, concerning for microabscesses given the clinical context. Splenic involvement  would be an unusual pathway for spread of prostate cancer. Adrenals/Urinary Tract: Bilateral thickened adrenal glands, increased from prior. The kidneys appear unremarkable. Foley catheter in the otherwise empty urinary bladder. Stomach/Bowel: Small type 1 hiatal hernia. Suspected varices along the margin of the proximal stomach. Large indirect right inguinal hernia containing multiple loops of small bowel, the bottom of the cecum, and the appendix. Because these loops of bowel extend far down in the distended scrotum and well below the ischial tuberosities, the entirety of the bowel down in the scrotum is not included. Sigmoid colon diverticulosis. Moderate prominence of rectal stool ball. 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 Atherosclerosis is present, including aortoiliac atherosclerotic disease. There is atheromatous  plaque proximally in the celiac trunk and SMA without overt occlusion. Reproductive: Brachytherapy seed implants in the indistinctly marginated prostate gland. Other: Small amount of edema along the root of the mesentery. Mild nonspecific presacral edema. Musculoskeletal: Diffuse patchy bony sclerosis compatible with widespread osseous metastatic disease. Lumbar spondylosis causes mild multilevel foraminal impingement. Large right indirect inguinal hernia containing loops of bowel including small bowel loops and the appendix. A small indirect left inguinal hernia contains adipose tissue. IMPRESSION: 1. Multiple new small hypodense lesions in the spleen, concerning for microabscesses given the clinical context. 2. Airspace filling consolidation in the right lower lobe, suspicious for pneumonia. 3. Moderate to large left and small right pleural effusion. 4. Heterogeneous enhancement in the liver with higher density peripherally, probably related to an unusual distribution of steatosis although hepatitis might also be considered. 5. 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 6. Large indirect right inguinal hernia containing multiple loops of small bowel, the bottom of the cecum, and the appendix. Because these loops of bowel extend far down in the distended scrotum and well below the ischial tuberosities, the entirety of the bowel down in the scrotum is not included. 7. Sigmoid colon diverticulosis. 8. Small type 1 hiatal hernia. 9. 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 10. Brachytherapy seed implants in the indistinctly marginated prostate gland. 11. Aortic atherosclerosis. Aortic Atherosclerosis (ICD10-I70.0). Electronically Signed   By: Gaylyn Rong M.D.   On: 06/03/2023 17:44   CT Head Wo Contrast  Result Date: 06/04/2023 CLINICAL DATA:   Altered mental status EXAM: CT HEAD WITHOUT CONTRAST TECHNIQUE: Contiguous axial images were obtained from the base of the skull through the vertex without intravenous contrast. RADIATION DOSE REDUCTION: This exam was performed according to the departmental dose-optimization program which includes automated exposure control, adjustment of the mA and/or kV according to patient size and/or use of iterative reconstruction technique. COMPARISON:  None Available. FINDINGS: Brain: No acute intracranial findings are seen. There are no signs of bleeding within the cranium. Dense calcifications are seen in basal ganglia on both sides. Cortical sulci are prominent. There is decreased density in periventricular white matter. Vascular: There are scattered coarse arterial calcifications. Skull: No acute findings are seen. Sinuses/Orbits: Unremarkable. Other: None. IMPRESSION: No acute intracranial findings are seen. Atrophy. Small-vessel disease. Electronically Signed   By: Ernie Avena M.D.   On: 06/05/2023 17:32   DG Chest Portable 1 View  Result Date: 05/19/2023 CLINICAL DATA:  Altered mental status.  History of prostate cancer EXAM: PORTABLE CHEST 1 VIEW COMPARISON:  X-ray 03/27/2022 and older FINDINGS: Developing small left effusion and adjacent lung opacity. Possible infiltrate. No  pneumothorax or edema. Normal cardiopericardial silhouette. Overlapping cardiac leads. Surgical clips in the right upper quadrant. Scattered areas of bony sclerosis identified consistent with known osseous metastatic disease from prostate cancer. IMPRESSION: New small left effusion and adjacent opacity. Possible infiltrate. Recommend follow-up Electronically Signed   By: Karen Kays M.D.   On: 06/01/2023 15:15    Catarina Hartshorn, DO  [facility]  If 7PM-7AM, please contact night-coverage [email or web address] Password [facility] 05/25/2023, 9:54 AM   LOS: 1 day

## 2023-05-25 NOTE — Progress Notes (Addendum)
Initial Nutrition Assessment  DOCUMENTATION CODES:      INTERVENTION:  Ensure Plus with meals (add milk to decrease sweetness) if patient prefers  Assist with feeding as appropriate to maximize intake   Gentle encouragement of nutrition as patient allows   Provide his favorite foods   NUTRITION DIAGNOSIS:   Severe Malnutrition related to chronic illness (metastatic cancer) as evidenced by meal completion < 25%, severe muscle depletion, severe fat depletion.  -ongoing  GOAL: Honor healthcare wishes and allow patient to eat and drink as desired    MONITOR:  PO intake, Supplement acceptance, SkinPatient progression, food/beverage preferences   REASON FOR ASSESSMENT:  Malnutrition screen, poor po      ASSESSMENT: Patient is a 82 yo male who has a hx of DM, GERD, metastatic prostate cancer, and Failure to Thrive. Presents with report of chronically dark stools, hemoglobin (5.5), recurrent blood loss anemia per chart. Patient followed by [facility].   Per MD he has received 2 units of PRBC and has 2 more units ordered. Patient is receiving his 2nd unit of blood during RD visit and is planned for discharge home later today.   Patient appetite - poor overall per spouse. He eats 1 meal daily and is a very slow eater. Often falls asleep during meal and / or wife has to reheat his food. He complains the Ensure is too sweet- we talked about ways to make the shakes more palatable for him.   Per chart patient has acute weight loss in March (5.6% < 1 month) from 65.3 kg to current 61.2 kg. He has been maintaining 61-63 kg the past 2 months. In February his weight was up to 67.1 kg.   6/14 Patient readmitted- sepsis/UTI, altered mental status. RD assessed on 6/4 noted above. His spouse and granddaughter are bedside. His lunch tray is here and he has eaten 50% vanilla pudding, a bite of sandwich and a bite of mashed potatoes. He drank 100% of milk this morning at breakfast and ~25% of  an Ensure Plus. Assisted with feeding. Patient short of breath and asked that his wife answer my questions. His wife comments that patient wants to go home before Sunday. His son has a cookout planned for him to honor Father's Day. On 6/14 wt. 60.9 kg.    Severe chronic malnutrition -per diet recall; energy intake </=75% for >/= 1 month, NFPE: severe muscle and fat loss.   Medications: ferrous sulfate (325 mg daily), calcium carbonate, folic acid (1 mg/day), mag-ox (400 mg BID)     Latest Ref Rng & Units 05/25/2023    5:08 AM 05/28/2023    2:38 PM 05/17/2023    2:36 PM  BMP  Glucose 70 - 99 mg/dL 540  981  191   BUN 8 - 23 mg/dL 46  43  41   Creatinine 0.61 - 1.24 mg/dL 4.78  2.95  6.21   Sodium 135 - 145 mmol/L 137  139  139   Potassium 3.5 - 5.1 mmol/L 3.8  4.2  4.3   Chloride 98 - 111 mmol/L 109  108  107   CO2 22 - 32 mmol/L 19  17    Calcium 8.9 - 10.3 mg/dL 6.9  6.9        NUTRITION - FOCUSED PHYSICAL EXAM:  Flowsheet Row Most Recent Value  Orbital Region Moderate depletion  Upper Arm Region Severe depletion  Thoracic and Lumbar Region Severe depletion  Buccal Region Moderate depletion  Temple Region Moderate depletion  Clavicle Bone Region Severe depletion  Clavicle and Acromion Bone Region Severe depletion  Scapular Bone Region Severe depletion  Dorsal Hand Severe depletion  Edema (RD Assessment) None  Hair Reviewed  Skin Reviewed  Nails Reviewed      Diet Order:   Diet Order             Diet regular Room service appropriate? Yes; Fluid consistency: Thin  Diet effective now                   EDUCATION NEEDS:  Not appropriate for education at this time  Skin:  Skin Assessment: Skin Integrity Issues: Skin Integrity Issues::  (deep tissue injury to sacrum)  Last BM:  unknown  Height:   Ht Readings from Last 1 Encounters:  05/15/2023 5\' 5"  (1.651 m)    Weight:   Wt Readings from Last 1 Encounters:  05/25/23 60.9 kg    Ideal Body Weight:   61.82    BMI:  Body mass index is 22.34 kg/m.  Estimated Nutritional Needs:   Kcal:   1900-2100  Protein:   89-95 gr  Fluid:   2 liters daily   Royann Shivers MS,RD,CSG,LDN Contact: Loretha Stapler

## 2023-05-25 NOTE — TOC Initial Note (Signed)
Transition of Care (TOC) - Initial/Assessment Note    Patient Details  Name: Alan [name]. MRN: [id number] Date of Birth: 1941/08/23  Transition of Care [facility]) CM/SW Contact:    Leitha Bleak, RN Phone Number: 05/25/2023, 4:09 PM  Clinical Narrative:     Patient admitted with  severe sepsis. Recent admission. Patient is active with [facility] and plans to return home on hospice. TOC following.              Expected Discharge Plan: Home w Hospice Care Barriers to Discharge: Continued Medical Work up   Patient Goals and CMS Choice Patient states their goals for this hospitalization and ongoing recovery are:: return home CMS Medicare.gov Compare Post Acute Care list provided to:: Patient Represenative (must comment) Choice offered to / list presented to : Spouse      Expected Discharge Plan and Services       Living arrangements for the past 2 months: Single Family Home                          Prior Living Arrangements/Services Living arrangements for the past 2 months: Single Family Home Lives with:: Spouse                   Activities of Daily Living Home Assistive Devices/Equipment: Oxygen ADL Screening (condition at time of admission) Patient's cognitive ability adequate to safely complete daily activities?: No Is the patient deaf or have difficulty hearing?: No Does the patient have difficulty seeing, even when wearing glasses/contacts?: No Does the patient have difficulty concentrating, remembering, or making decisions?: Yes Patient able to express need for assistance with ADLs?: No Does the patient have difficulty dressing or bathing?: Yes Independently performs ADLs?: No Communication: Needs assistance Is this a change from baseline?: Pre-admission baseline Dressing (OT): Dependent Is this a change from baseline?: Pre-admission baseline Grooming: Dependent Is this a change from baseline?: Pre-admission baseline Feeding: Dependent Is this a  change from baseline?: Pre-admission baseline Bathing: Dependent Is this a change from baseline?: Pre-admission baseline Toileting: Dependent Is this a change from baseline?: Pre-admission baseline In/Out Bed: Dependent Is this a change from baseline?: Pre-admission baseline Walks in Home: Dependent Is this a change from baseline?: Pre-admission baseline Does the patient have difficulty walking or climbing stairs?: Yes Weakness of Legs: Both Weakness of Arms/Hands: Both  Permission Sought/Granted                  Emotional Assessment              Admission diagnosis:  Severe sepsis (HCC) [A41.9, R65.20] Sepsis without acute organ dysfunction, due to unspecified organism [facility]) [A41.9] Patient Active Problem List   Diagnosis Date Noted   Severe sepsis (HCC) 06/01/2023   Acute metabolic encephalopathy 06/04/2023   Splenic abscess 05/18/2023   Atrial fibrillation with RVR (HCC) 05/22/2023   Thrombocytopenia (HCC) 05/18/2023   Protein-calorie malnutrition, severe 05/15/2023   Acute blood loss anemia (ABLA) 05/14/2023   Symptomatic anemia 03/02/2023   Supratherapeutic INR 03/02/2023   Iron deficiency anemia 03/02/2023   Atrial fibrillation, chronic (HCC) 03/02/2023   Bone pain 03/02/2023   Constipation 03/02/2023   Right inguinal hernia 03/02/2023   Genetic testing 03/31/2022   Pneumonia 03/12/2022   Normocytic anemia 03/12/2022   Normal anion gap metabolic acidosis 03/12/2022   Malignant neoplasm metastatic to bone (HCC) 03/06/2022   AKI (acute kidney injury) (HCC) 09/22/2019   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  Long term (current) use of anticoagulants [Z79.01] 06/29/2016   Primary hypercoagulable state (HCC) [D68.59] 06/29/2016   History of DVT (deep vein thrombosis) 06/29/2016   Paroxysmal atrial fibrillation (HCC) 04/19/2016   Essential hypertension 04/09/2016   Non-insulin dependent type 2 diabetes mellitus (HCC) 04/09/2016   GERD  (gastroesophageal reflux disease) 01/24/2016   ED (erectile dysfunction)    Prostate cancer (HCC)    Mixed hyperlipidemia 04/29/2013   Gout 04/29/2013   PCP:  Dettinger, Elige Radon, MD Pharmacy:   [facility] [address] [place] - [address] [place] [address] Phone: [phone] Fax: [phone]     Social Determinants of Health (SDOH) Social History: SDOH Screenings   Food Insecurity: No Food Insecurity (05/23/2023)  Recent Concern: Food Insecurity - Food Insecurity Present (03/02/2023)  Housing: Low Risk  (06/10/2023)  Transportation Needs: No Transportation Needs (05/17/2023)  Utilities: Not At Risk (05/13/2023)  Alcohol Screen: Low Risk  (08/12/2021)  Depression (PHQ2-9): Low Risk  (05/11/2023)  Recent Concern: Depression (PHQ2-9) - Medium Risk (04/02/2023)  Financial Resource Strain: Low Risk  (08/12/2021)  Physical Activity: Inactive (10/25/2021)  Social Connections: Socially Integrated (08/12/2021)  Stress: Stress Concern Present (10/25/2021)  Tobacco Use: Medium Risk (05/23/2023)   SDOH Interventions:     Readmission Risk Interventions    05/25/2023    4:08 PM  Readmission Risk Prevention Plan  Transportation Screening Complete  PCP or Specialist Appt within 3-5 Days Complete  HRI or Home Care Consult Complete  Social Work Consult for Recovery Care Planning/Counseling Complete  Palliative Care Screening Complete  Medication Review Oceanographer) Complete

## 2023-05-25 NOTE — Care Management Important Message (Signed)
Important Message  Patient Details  Name: Alan [name]. MRN: [id number] Date of Birth: 12/18/1940   Medicare Important Message Given:  Yes     Corey Harold 05/25/2023, 4:13 PM

## 2023-05-26 DIAGNOSIS — D696 Thrombocytopenia, unspecified: Secondary | ICD-10-CM | POA: Diagnosis not present

## 2023-05-26 DIAGNOSIS — I4891 Unspecified atrial fibrillation: Secondary | ICD-10-CM | POA: Diagnosis not present

## 2023-05-26 DIAGNOSIS — G9341 Metabolic encephalopathy: Secondary | ICD-10-CM | POA: Diagnosis not present

## 2023-05-26 DIAGNOSIS — A419 Sepsis, unspecified organism: Secondary | ICD-10-CM | POA: Diagnosis not present

## 2023-05-26 DIAGNOSIS — J181 Lobar pneumonia, unspecified organism: Secondary | ICD-10-CM

## 2023-05-26 LAB — CBC
HCT: 21 % — ABNORMAL LOW (ref 39.0–52.0)
Hemoglobin: 6.8 g/dL — CL (ref 13.0–17.0)
MCH: 29.1 pg (ref 26.0–34.0)
MCHC: 32.4 g/dL (ref 30.0–36.0)
MCV: 89.7 fL (ref 80.0–100.0)
Platelets: 19 10*3/uL — CL (ref 150–400)
RBC: 2.34 MIL/uL — ABNORMAL LOW (ref 4.22–5.81)
RDW: 19.6 % — ABNORMAL HIGH (ref 11.5–15.5)
WBC: 4.7 10*3/uL (ref 4.0–10.5)
nRBC: 1.7 % — ABNORMAL HIGH (ref 0.0–0.2)

## 2023-05-26 LAB — BPAM RBC: Blood Product Expiration Date: 202407092359

## 2023-05-26 LAB — URINE CULTURE

## 2023-05-26 LAB — CULTURE, BLOOD (ROUTINE X 2)

## 2023-05-26 LAB — COMPREHENSIVE METABOLIC PANEL
ALT: 21 U/L (ref 0–44)
AST: 54 U/L — ABNORMAL HIGH (ref 15–41)
Albumin: 1.5 g/dL — ABNORMAL LOW (ref 3.5–5.0)
Alkaline Phosphatase: 931 U/L — ABNORMAL HIGH (ref 38–126)
Anion gap: 8 (ref 5–15)
BUN: 51 mg/dL — ABNORMAL HIGH (ref 8–23)
CO2: 19 mmol/L — ABNORMAL LOW (ref 22–32)
Calcium: 7.1 mg/dL — ABNORMAL LOW (ref 8.9–10.3)
Chloride: 111 mmol/L (ref 98–111)
Creatinine, Ser: 1.19 mg/dL (ref 0.61–1.24)
GFR, Estimated: >60 mL/min (ref >60)
Glucose, Bld: 142 mg/dL — ABNORMAL HIGH (ref 70–99)
Potassium: 3.5 mmol/L (ref 3.5–5.1)
Sodium: 138 mmol/L (ref 135–145)
Total Bilirubin: 1.3 mg/dL — ABNORMAL HIGH (ref 0.3–1.2)
Total Protein: 4 g/dL — ABNORMAL LOW (ref 6.5–8.1)

## 2023-05-26 LAB — GLUCOSE, CAPILLARY
Glucose-Capillary: 127 mg/dL — ABNORMAL HIGH (ref 70–99)
Glucose-Capillary: 147 mg/dL — ABNORMAL HIGH (ref 70–99)
Glucose-Capillary: 150 mg/dL — ABNORMAL HIGH (ref 70–99)

## 2023-05-26 LAB — TYPE AND SCREEN
ABO/RH(D): B POS
Antibody Screen: NEGATIVE
Unit division: 0

## 2023-05-26 LAB — PREPARE RBC (CROSSMATCH)

## 2023-05-26 MED ORDER — FENTANYL 12 MCG/HR TD PT72
1.0000 | MEDICATED_PATCH | TRANSDERMAL | Status: DC
  Administered 2023-05-26: 1 via TRANSDERMAL
  Filled 2023-05-26 (×2): qty 1

## 2023-05-26 MED ORDER — VANCOMYCIN HCL IN DEXTROSE 1-5 GM/200ML-% IV SOLN
1000.0000 mg | INTRAVENOUS | Status: DC
  Administered 2023-05-26: 1000 mg via INTRAVENOUS
  Filled 2023-05-26: qty 200

## 2023-05-26 MED ORDER — DILTIAZEM HCL-DEXTROSE 125-5 MG/125ML-% IV SOLN (PREMIX)
5.0000 mg/h | INTRAVENOUS | Status: DC
  Administered 2023-05-26 – 2023-05-27 (×2): 5 mg/h via INTRAVENOUS
  Filled 2023-05-26 (×2): qty 125

## 2023-05-26 MED ORDER — DILTIAZEM LOAD VIA INFUSION
10.0000 mg | Freq: Once | INTRAVENOUS | Status: AC
  Administered 2023-05-26: 10 mg via INTRAVENOUS
  Filled 2023-05-26: qty 10

## 2023-05-26 MED ORDER — SODIUM CHLORIDE 0.9% IV SOLUTION: Freq: Once | INTRAVENOUS | Status: AC

## 2023-05-26 NOTE — Progress Notes (Signed)
Pharmacy Antibiotic Note  Alan [name]. is a 82 y.o. male admitted on 06/01/2023 with AMS, found to have severe sepsis, likely 2nd PNA on day 3 of Abx. Renal function has since improved, warranting Vanc dosage adjustment for target AUC 538. (Vd 0.46, Ke 0.04)  Plan: Change vancomycin to 1gm IV q 24h Continue Cefepime 2mg  IV q12h F/U cxs and clinical progress Monitor V/S, labs  Temp (24hrs), Avg:98.3 F (36.8 C), Min:97.8 F (36.6 C), Max:98.7 F (37.1 C)  Recent Labs  Lab 06/01/2023 1436 05/26/2023 1438 05/15/2023 1626 05/21/2023 2212 05/25/23 0508 05/26/23 0500  WBC  --  4.9  --   --  3.5* 4.7  CREATININE 1.70* 1.63*  --   --  1.29* 1.19  LATICACIDVEN  --  5.4* 4.9* 4.0*  --   --      Estimated Creatinine Clearance: 43.1 mL/min (by C-G formula based on SCr of 1.19 mg/dL).    Allergies  Allergen Reactions   Allopurinol Other (See Comments)    Significantly decreased WBC's   Colchicine Other (See Comments)    Significantly decreased WBC's    Antimicrobials this admission: cefepime 6/13 >>  Vanco 6/13 >> Flagyl 6/13>>  Microbiology results: 6/13 BCx: pending 6/13 UCx: NGF  Thank you for allowing pharmacy to be a part of this patient's care.  Caryl Asp, PharmD Clinical Pharmacist 05/26/2023 8:18 AM

## 2023-05-26 NOTE — Progress Notes (Signed)
PROGRESS NOTE  Alan [name]. ZOX:[id number] DOB: 08-Feb-1941 DOA: 05/26/2023 PCP: Dettinger, Elige Radon, MD  Brief History:  82 year old male with a history of metastatic prostate cancer to bone, hypertrophic cardiomyopathy, diabetes mellitus type 2, hypertension, hyperlipidemia, paroxysmal atrial fibrillation, right lower extremity DVT presenting with Patient is currently hospice patient.  He was brought to the ED with complaints of altered mental status over the past 2 days.  His last meal was about 2 days ago, he has not eaten since or drunk since then and has been confused.  In the emergency department, the patient was somnolent with his eyes closed.  He was able to open his eyes with touch but unable to provide any history.  Spouse denies any vomiting, diarrhea, hematochezia, hematemesis.  He has dark stools chronically. Recent hospitalization 6/3 to 05/15/2023 for symptomatic acute blood loss anemia, required 4 units PRBC, patient declined aggressive care and endoscopy.   ED Course: Febrile to 102, tachycardic heart rate initially up to 150s improved to 120s, tachypneic heart rate 29-49.  Hypotensive down to 73/45.  WBC 4.9.  Lactic acidosis 5.4 > 4.9.  Cr elevated 1.63.  Platelets low at 29. Blood Cultures obtained Head CT negative for acute abnormality. Abdominal CT-stain for microabscesses in the spleen, consolidation suspicious for pneumonia, moderate to large left and small right pleural effusion, loops of bowel extending further down in distended scrotum well below the ischial tuberosities. IV Vanco cefepime and metronidazole started. During the hospitalization, patient developed afib with RVR.  He was started on diltiazem drip   Assessment/Plan:  Severe sepsis [facility]) Meeting severe sepsis arterial with fever of 102, tachycardia heart rate 117-152, tachypnea respiratory rate 29-44, with evidence of multiple endorgan dysfunction, significant lactic acidosis of 5.4 > 4.9,  encephalopathy, AKI, thrombocytopenia.  Etiology likely secondary to pneumonia -COVID, influenza negative. -Continue broad-spectrum antibiotics IV vancomycin cefepime and metronidazole -Follow up blood cultures--neg to date -urine cultures--neg -3 Liter LR bolus given, continue N/s 100cc/hr x 15hrs -Patient is a hospice patient.  ED provider, and myself have had extensive discussion with patient's family- spouse at bedside about expectation considering severity of illness, and goals of care. They are reasonable want IV antibiotics, fluids, and treating the treatable without aggressive interventions like central lines, and I confirmed that patient's is DNR.   Lobar Pneumonia -noted on CT abd/pelvis -initially on vanc, cefepime,  metronidazole -d/c vanc and metronidazole   Pleural effusion -05/25/23 [place] chest--R>L pleural effusion -request right thora if cannot wean oxygen -discussed with spouse and she consents after discussion of risks, benefits, alternatives--now spouse does not want thora  AKI -due to hemodynamic changes and volume depletion -baseline creatinine 0.9-1.1 -presented with serum creatinine 1.63   Thrombocytopenia (HCC) Platelets 29, last checked 9 days ago platelets were 65, gradual decline over the past month from 160s. -acute drop in part due to acute infection -Check B12--879  Paroxysmal Atrial fibrillation with RVR (HCC) -Rate up to 140s,  -personally reviewed EKG-atrial fibrillation, nonsp STT changes.   -Not on anticoagulation due to history of acute blood loss anemias, recent hospitalization requiring 4 units PRBC 6/3-05/15/23.  -initially held metoprolol due to soft BPs -start dilitazem drip   Metastatic prostate cancer to bone -CT abdomen and pelvis on 01/11/2023 shows progression of his disease.   -Saw Dr. 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 Verdis Prime -Supportive care at this point -holding metoprolol due to low BPs initially   Essential hypertension -Holding amlodipine -holding metoprolol due to hypotension   Severe malnutrition -continue Ensure Plus   Acute anemia/Symptomatic anemia -Hgb down to 6.8 -transfuse 2 units PRBC -spouse does not want any invasive procedures             Family Communication: spouse updated 6/15   Consultants:  palliative   Code Status:  DNR--confirmed with spouse   DVT Prophylaxis:  SCDs     Procedures: As Listed in Progress Note Above   Antibiotics: Vanc 6/13>>6/15 Cefepime 6/13>> Metronidazole 6/13>>6/15           Subjective: Patient is somnolent but arousable.  Denies cp, sob.  Remainder ROS not possible due to encephalopathy  Objective: Vitals:   05/26/23 0700 05/26/23 0732 05/26/23 0800 05/26/23 0900  BP: (!) 144/61  (!) 123/40 (!) 111/38  Pulse: (!) 30  91 (!) 32  Resp: (!) 29  18 19   Temp:  98.2 F (36.8 C)    TempSrc:  Oral    SpO2: 95%  99% 97%  Weight:      Height:        Intake/Output Summary (Last 24 hours) at 05/26/2023 1411 Last data filed at 05/26/2023 1000 Gross per 24 hour  Intake 456.67 ml  Output 800 ml  Net -343.33 ml   Weight change: 6.2  kg Exam:  General:  Pt is alert, follows commands appropriately, not in acute distress HEENT: No icterus, No thrush, No neck mass, Bud/AT Cardiovascular: RRR, S1/S2, no rubs, no gallops Respiratory: bibasilar rales. No wheeze Abdomen: Soft/+BS, non tender, non distended, no guarding Extremities: No edema, No lymphangitis, No petechiae, No rashes, no synovitis   Data Reviewed: I have personally reviewed following labs and imaging studies Basic Metabolic Panel: Recent Labs  Lab 05/31/2023 1436 06/02/2023 1438 05/25/23 0508 05/26/23 0500  NA 139 139 137 138  K 4.3 4.2 3.8 3.5  CL 107 108 109 111  CO2  --  17* 19* 19*  GLUCOSE 171* 173* 141* 142*  BUN 41* 43* 46* 51*  CREATININE 1.70* 1.63* 1.29* 1.19  CALCIUM  --  6.9* 6.9* 7.1*   Liver Function Tests: Recent Labs  Lab 05/23/2023 1438 05/26/23 0500  AST 117* 54*  ALT 21 21  ALKPHOS 1,945* 931*  BILITOT 2.5* 1.3*  PROT 4.6* 4.0*  ALBUMIN 2.0* 1.5*   No results for input(s): "LIPASE", "AMYLASE" in the last 168 hours. No results for input(s): "AMMONIA" in the last 168 hours. Coagulation Profile: Recent Labs  Lab 06/06/2023 1438  INR 1.7*   CBC: Recent Labs  Lab 05/23/2023 1436 06/05/2023 1438 05/25/23 0508 05/26/23 0500  WBC  --  4.9 3.5* 4.7  NEUTROABS  --  2.8  --   --   HGB 7.5* 8.4* 7.5* 6.8*  HCT 22.0* 26.2* 23.5* 21.0*  MCV  --  90.7 89.4 89.7  PLT  --  29* 18*  19*   Cardiac Enzymes: No results for input(s): "CKTOTAL", "CKMB", "CKMBINDEX", "TROPONINI" in the last 168 hours. BNP: Invalid input(s): "POCBNP" CBG: Recent Labs  Lab 05/25/23 1529 05/25/23 1951 05/25/23 2332 05/26/23 0357 05/26/23 1053  GLUCAP 163* 139* 131* 127* 147*   HbA1C: No results for input(s): "HGBA1C" in the last 72 hours. Urine analysis:    Component Value Date/Time   COLORURINE AMBER (A) 06/09/2023 2049   APPEARANCEUR CLOUDY (A) 05/14/2023 2049   APPEARANCEUR Clear 08/31/2022 1551   LABSPEC 1.032 (H) 05/12/2023 2049    PHURINE 5.0 05/23/2023 2049   GLUCOSEU NEGATIVE 05/15/2023 2049   HGBUR LARGE (A) 06/05/2023 2049   BILIRUBINUR NEGATIVE 05/25/2023 2049   BILIRUBINUR Negative 08/31/2022 1551   KETONESUR NEGATIVE 05/22/2023 2049   PROTEINUR 100 (A) 06/07/2023 2049   NITRITE NEGATIVE 05/13/2023 2049   LEUKOCYTESUR TRACE (A) 06/07/2023 2049   Sepsis Labs: @LABRCNTIP (procalcitonin:4,lacticidven:4) ) Recent Results (from the past 240 hour(s))  Culture, blood (Routine x 2)     Status: None (Preliminary result)   Collection Time: 06/06/2023  2:38 PM   Specimen: BLOOD  Result Value Ref Range Status   Specimen Description BLOOD BLOOD RIGHT HAND  Final   Special Requests   Final    BOTTLES DRAWN AEROBIC AND ANAEROBIC Blood Culture adequate volume   Culture   Final    NO GROWTH 2 DAYS Performed at [facility], [address], [place], [place] [address]    Report Status PENDING  Incomplete  Culture, blood (Routine x 2)     Status: None (Preliminary result)   Collection Time: 06/07/2023  2:38 PM   Specimen: BLOOD  Result Value Ref Range Status   Specimen Description BLOOD BLOOD LEFT ARM  Final   Special Requests   Final    BOTTLES DRAWN AEROBIC AND ANAEROBIC Blood Culture adequate volume   Culture   Final    NO GROWTH 2 DAYS Performed at [facility], [address] [place], [place] [address]    Report Status PENDING  Incomplete  Resp panel by RT-PCR (RSV, Flu A&B, Covid) Anterior Nasal Swab     Status: None   Collection Time: 05/25/2023  3:03 PM   Specimen: Anterior Nasal Swab  Result Value Ref Range Status   SARS Coronavirus 2 by RT PCR NEGATIVE NEGATIVE Final    Comment: (NOTE) SARS-CoV-2 target nucleic acids are NOT DETECTED.  The SARS-CoV-2 RNA is generally detectable in upper respiratory specimens during the acute phase of infection. The lowest concentration of SARS-CoV-2 viral copies this assay can detect is 138 copies/mL. A negative result does not preclude SARS-Cov-2 infection and should not  be used as the sole basis for treatment or other patient management decisions. A negative result may occur with  improper specimen collection/handling, submission of specimen other than nasopharyngeal swab, presence of viral mutation(s) within the areas targeted by this assay, and inadequate number of viral copies(<138 copies/mL). A negative result must be combined with clinical observations, patient history, and epidemiological information. The expected result is Negative.  Fact Sheet for Patients:  BloggerCourse.com  Fact Sheet for Healthcare Providers:  SeriousBroker.it  This test is no t yet approved or cleared by the [place] FDA and  has been authorized for detection and/or diagnosis of SARS-CoV-2 by FDA under an Emergency Use Authorization (EUA). This EUA will remain  in effect (meaning this test can be used) for the duration of the COVID-19 declaration under Section 564(b)(1) of the Act, 21 U.S.C.section 360bbb-3(b)(1), unless the  authorization is terminated  or revoked sooner.       Influenza A by PCR NEGATIVE NEGATIVE Final   Influenza B by PCR NEGATIVE NEGATIVE Final    Comment: (NOTE) The Xpert Xpress SARS-CoV-2/FLU/RSV plus assay is intended as an aid in the diagnosis of influenza from Nasopharyngeal swab specimens and should not be used as a sole basis for treatment. Nasal washings and aspirates are unacceptable for Xpert Xpress SARS-CoV-2/FLU/RSV testing.  Fact Sheet for Patients: BloggerCourse.com  Fact Sheet for Healthcare Providers: SeriousBroker.it  This test is not yet approved or cleared by the [place] FDA and has been authorized for detection and/or diagnosis of SARS-CoV-2 by FDA under an Emergency Use Authorization (EUA). This EUA will remain in effect (meaning this test can be used) for the duration of the COVID-19 declaration under Section  564(b)(1) of the Act, 21 U.S.C. section 360bbb-3(b)(1), unless the authorization is terminated or revoked.     Resp Syncytial Virus by PCR NEGATIVE NEGATIVE Final    Comment: (NOTE) Fact Sheet for Patients: BloggerCourse.com  Fact Sheet for Healthcare Providers: SeriousBroker.it  This test is not yet approved or cleared by the [place] FDA and has been authorized for detection and/or diagnosis of SARS-CoV-2 by FDA under an Emergency Use Authorization (EUA). This EUA will remain in effect (meaning this test can be used) for the duration of the COVID-19 declaration under Section 564(b)(1) of the Act, 21 U.S.C. section 360bbb-3(b)(1), unless the authorization is terminated or revoked.  Performed at [facility], [address] [place], [place] [address]   MRSA Next Gen by PCR, Nasal     Status: None   Collection Time: 06/07/2023  7:58 PM   Specimen: Nasal Mucosa; Nasal Swab  Result Value Ref Range Status   MRSA by PCR Next Gen NOT DETECTED NOT DETECTED Final    Comment: (NOTE) The GeneXpert MRSA Assay (FDA approved for NASAL specimens only), is one component of a comprehensive MRSA colonization surveillance program. It is not intended to diagnose MRSA infection nor to guide or monitor treatment for MRSA infections. Test performance is not FDA approved in patients less than 67 years old. Performed at [facility], [address] [place], [place] [address]   Remove and replace urinary cath (placed > 5 days) then obtain urine culture from new indwelling urinary catheter.     Status: None   Collection Time: 06/04/2023  8:49 PM   Specimen: Urine, Catheterized  Result Value Ref Range Status   Specimen Description   Final    URINE, CATHETERIZED Performed at [facility], [address], [place], [place] [address]    Special Requests   Final    NONE Performed at [facility], [address], [place], [place] [address]    Culture    Final    NO GROWTH Performed at [facility], [address] [place], [place] [address]    Report Status 05/26/2023 FINAL  Final     Scheduled Meds:  sodium chloride   Intravenous Once   Chlorhexidine Gluconate Cloth  6 each Topical Daily   diltiazem  10 mg Intravenous Once   feeding supplement  237 mL Oral BID BM   fentaNYL  1 patch Transdermal Q72H   folic acid  1 mg Oral Daily   insulin aspart  0-9 Units Subcutaneous Q4H   Continuous Infusions:  ceFEPime (MAXIPIME) IV 2 g (05/26/23 1338)   diltiazem (CARDIZEM) infusion      Procedures/Studies: [place] CHEST (PLEURAL EFFUSION)  Result Date: 05/25/2023 CLINICAL DATA:  Pleural effusion. EXAM: CHEST ULTRASOUND COMPARISON:  Chest x-ray from yesterday. FINDINGS: Ultrasound demonstrates moderate left and small right pleural effusions. IMPRESSION: 1. Moderate left and small right pleural effusions. Electronically Signed   By: Obie Dredge M.D.   On: 05/25/2023 15:38   CT ABDOMEN PELVIS W CONTRAST  Result Date: 06/08/2023 CLINICAL DATA:  Metastatic prostate cancer. Sepsis, altered mental status. * Tracking Code: BO * EXAM: CT ABDOMEN AND PELVIS WITH CONTRAST TECHNIQUE: Multidetector CT imaging of the abdomen and pelvis was performed using the standard protocol following bolus administration of intravenous contrast. RADIATION DOSE REDUCTION: This exam was performed according to the departmental dose-optimization program which includes automated exposure control, adjustment of the mA and/or kV according to patient size and/or use of iterative reconstruction technique. CONTRAST:  75mL OMNIPAQUE IOHEXOL 300 MG/ML  SOLN COMPARISON:  01/11/2023 FINDINGS: Lower chest: Airspace filling consolidation appearance in the right lower lobe. Passive atelectasis in the left lower lobe. Moderate to large left and small right pleural effusion. Descending thoracic aortic atherosclerosis. Hepatobiliary: Heterogeneous enhancement in the liver with higher  density peripherally, probably related to an unusual distribution of steatosis although hepatitis might also be considered. Cholecystectomy. No biliary dilatation or pneumobilia. Pancreas: Pancreas is somewhat blurred by motion artifact but there continues to be dilatation of the dorsal pancreatic duct up to 5 mm in diameter, not substantially changed from 01/11/2023. 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 Spleen: The spleen measures 11.3 by 7.9 by 11.8 cm (volume = 550 cm^3) and demonstrates multiple new small hypodense lesions which were not present on 01/11/2023, concerning for microabscesses given the clinical context. Splenic involvement would be an unusual pathway for spread of prostate cancer. Adrenals/Urinary Tract: Bilateral thickened adrenal glands, increased from prior. The kidneys appear unremarkable. Foley catheter in the otherwise empty urinary bladder. Stomach/Bowel: Small type 1 hiatal hernia. Suspected varices along the margin of the proximal stomach. Large indirect right inguinal hernia containing multiple loops of small bowel, the bottom of the cecum, and the appendix. Because these loops of bowel extend far down in the distended scrotum and well below the ischial tuberosities, the entirety of the bowel down in the scrotum is not included. Sigmoid colon diverticulosis. Moderate prominence of rectal stool ball. 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 Atherosclerosis is present, including aortoiliac atherosclerotic disease. There is atheromatous plaque proximally in the celiac trunk and SMA without overt occlusion. Reproductive: Brachytherapy seed implants in the indistinctly marginated prostate gland. Other: Small amount of edema along the root of the mesentery.  Mild nonspecific presacral edema. Musculoskeletal: Diffuse patchy bony sclerosis compatible with widespread osseous metastatic disease. Lumbar spondylosis causes mild multilevel foraminal impingement. Large right indirect inguinal hernia containing loops of bowel including small bowel loops and the appendix. A small indirect left inguinal hernia contains adipose tissue. IMPRESSION: 1. Multiple new small hypodense lesions in the spleen, concerning for microabscesses given the clinical context. 2. Airspace filling consolidation in the right lower lobe, suspicious for pneumonia. 3. Moderate to large left and small right pleural effusion. 4. Heterogeneous enhancement in the liver with higher density peripherally, probably related to an unusual distribution of steatosis although hepatitis might also be considered. 5. 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 6. Large indirect right inguinal hernia containing multiple loops of small bowel, the bottom of the cecum, and the appendix. Because these loops of bowel extend far down in the distended scrotum and well below the ischial tuberosities, the entirety of the bowel down in the scrotum is not included. 7. Sigmoid colon diverticulosis. 8. Small type 1 hiatal hernia. 9. 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 10. Brachytherapy seed implants in the indistinctly marginated prostate gland. 11. Aortic atherosclerosis. Aortic Atherosclerosis (ICD10-I70.0). Electronically Signed   By: Gaylyn Rong M.D.   On: 05/17/2023 17:44   CT Head Wo Contrast  Result Date: 06/08/2023 CLINICAL DATA:  Altered mental status EXAM: CT HEAD WITHOUT CONTRAST TECHNIQUE: Contiguous axial images were obtained from the base of the skull through the vertex without intravenous contrast. RADIATION DOSE REDUCTION: This exam was performed  according to the departmental dose-optimization program which includes automated exposure control, adjustment of the mA and/or kV according to patient size and/or use of iterative reconstruction technique. COMPARISON:  None Available. FINDINGS: Brain: No acute intracranial findings are seen. There are no signs of bleeding within the cranium. Dense calcifications are seen in basal ganglia on both sides. Cortical sulci are prominent. There is decreased density in periventricular white matter. Vascular: There are scattered coarse arterial calcifications. Skull: No acute findings are seen. Sinuses/Orbits: Unremarkable. Other: None. IMPRESSION: No acute intracranial findings are seen. Atrophy. Small-vessel disease. Electronically Signed   By: Ernie Avena M.D.   On: 05/23/2023 17:32   DG Chest Portable 1 View  Result Date: 05/14/2023 CLINICAL DATA:  Altered mental status.  History of prostate cancer EXAM: PORTABLE CHEST 1 VIEW COMPARISON:  X-ray 03/27/2022 and older FINDINGS: Developing small left effusion and adjacent lung opacity. Possible infiltrate. No pneumothorax or edema. Normal cardiopericardial silhouette. Overlapping cardiac leads. Surgical clips in the right upper quadrant. Scattered areas of bony sclerosis identified consistent with known osseous metastatic disease from prostate cancer. IMPRESSION: New small left effusion and adjacent opacity. Possible infiltrate. Recommend follow-up Electronically Signed   By: Karen Kays M.D.   On: 06/01/2023 15:15    Catarina Hartshorn, DO  [facility]  If 7PM-7AM, please contact night-coverage [email or web address] Password [email or web address] 05/26/2023, 2:11 PM   LOS: 2 days

## 2023-05-27 ENCOUNTER — Other Ambulatory Visit (HOSPITAL_COMMUNITY): Payer: Self-pay | Admitting: *Deleted

## 2023-05-27 ENCOUNTER — Inpatient Hospital Stay (HOSPITAL_COMMUNITY): Payer: Medicare Other

## 2023-05-27 DIAGNOSIS — I4891 Unspecified atrial fibrillation: Secondary | ICD-10-CM | POA: Diagnosis not present

## 2023-05-27 DIAGNOSIS — C7951 Secondary malignant neoplasm of bone: Secondary | ICD-10-CM | POA: Diagnosis not present

## 2023-05-27 DIAGNOSIS — D696 Thrombocytopenia, unspecified: Secondary | ICD-10-CM | POA: Diagnosis not present

## 2023-05-27 DIAGNOSIS — J181 Lobar pneumonia, unspecified organism: Secondary | ICD-10-CM | POA: Diagnosis not present

## 2023-05-27 DIAGNOSIS — A419 Sepsis, unspecified organism: Secondary | ICD-10-CM | POA: Diagnosis not present

## 2023-05-27 LAB — COMPREHENSIVE METABOLIC PANEL
ALT: 18 U/L (ref 0–44)
AST: 31 U/L (ref 15–41)
Albumin: 1.5 g/dL — ABNORMAL LOW (ref 3.5–5.0)
Alkaline Phosphatase: 784 U/L — ABNORMAL HIGH (ref 38–126)
Anion gap: 8 (ref 5–15)
BUN: 44 mg/dL — ABNORMAL HIGH (ref 8–23)
CO2: 19 mmol/L — ABNORMAL LOW (ref 22–32)
Calcium: 7.3 mg/dL — ABNORMAL LOW (ref 8.9–10.3)
Chloride: 113 mmol/L — ABNORMAL HIGH (ref 98–111)
Creatinine, Ser: 1.01 mg/dL (ref 0.61–1.24)
GFR, Estimated: >60 mL/min (ref >60)
Glucose, Bld: 148 mg/dL — ABNORMAL HIGH (ref 70–99)
Potassium: 3.4 mmol/L — ABNORMAL LOW (ref 3.5–5.1)
Sodium: 140 mmol/L (ref 135–145)
Total Bilirubin: 1.6 mg/dL — ABNORMAL HIGH (ref 0.3–1.2)
Total Protein: 3.8 g/dL — ABNORMAL LOW (ref 6.5–8.1)

## 2023-05-27 LAB — GLUCOSE, CAPILLARY
Glucose-Capillary: 127 mg/dL — ABNORMAL HIGH (ref 70–99)
Glucose-Capillary: 129 mg/dL — ABNORMAL HIGH (ref 70–99)
Glucose-Capillary: 141 mg/dL — ABNORMAL HIGH (ref 70–99)
Glucose-Capillary: 91 mg/dL (ref 70–99)

## 2023-05-27 LAB — ECHOCARDIOGRAM COMPLETE
Area-P 1/2: 2.84 cm2
Height: 65 in
S' Lateral: 2.4 cm
Weight: 2257.51 oz

## 2023-05-27 LAB — TYPE AND SCREEN: Unit division: 0

## 2023-05-27 LAB — BPAM RBC
Blood Product Expiration Date: 202407182359
ISSUE DATE / TIME: 202406151445
ISSUE DATE / TIME: 202406151749
Unit Type and Rh: 1700
Unit Type and Rh: 5100

## 2023-05-27 LAB — CBC
HCT: 28.4 % — ABNORMAL LOW (ref 39.0–52.0)
Hemoglobin: 9.4 g/dL — ABNORMAL LOW (ref 13.0–17.0)
MCH: 29 pg (ref 26.0–34.0)
MCHC: 33.1 g/dL (ref 30.0–36.0)
MCV: 87.7 fL (ref 80.0–100.0)
Platelets: 17 10*3/uL — CL (ref 150–400)
RBC: 3.24 MIL/uL — ABNORMAL LOW (ref 4.22–5.81)
RDW: 17.9 % — ABNORMAL HIGH (ref 11.5–15.5)
WBC: 4.8 10*3/uL (ref 4.0–10.5)
nRBC: 3.6 % — ABNORMAL HIGH (ref 0.0–0.2)

## 2023-05-27 LAB — CULTURE, BLOOD (ROUTINE X 2)

## 2023-05-27 LAB — MAGNESIUM: Magnesium: 1.7 mg/dL (ref 1.7–2.4)

## 2023-05-27 MED ORDER — HYDROMORPHONE HCL 1 MG/ML IJ SOLN
0.5000 mg | INTRAMUSCULAR | Status: DC | PRN
  Administered 2023-05-27 – 2023-05-28 (×3): 0.5 mg via INTRAVENOUS
  Filled 2023-05-27 (×3): qty 0.5

## 2023-05-27 NOTE — Progress Notes (Addendum)
PROGRESS NOTE  Alan [name]. HYQ:[id number] DOB: 1941-08-02 DOA: 05/21/2023 PCP: Dettinger, Elige Radon, MD  Brief History:  82 year old male with a history of metastatic prostate cancer to bone, hypertrophic cardiomyopathy, diabetes mellitus type 2, hypertension, hyperlipidemia, paroxysmal atrial fibrillation, right lower extremity DVT presenting with Patient is currently hospice patient.  He was brought to the ED with complaints of altered mental status over the past 2 days.  His last meal was about 2 days ago, he has not eaten since or drunk since then and has been confused.  In the emergency department, the patient was somnolent with his eyes closed.  He was able to open his eyes with touch but unable to provide any history.  Spouse denies any vomiting, diarrhea, hematochezia, hematemesis.  He has dark stools chronically. Recent hospitalization 6/3 to 05/15/2023 for symptomatic acute blood loss anemia, required 4 units PRBC, patient declined aggressive care and endoscopy.   ED Course: Febrile to 102, tachycardic heart rate initially up to 150s improved to 120s, tachypneic heart rate 29-49.  Hypotensive down to 73/45.  WBC 4.9.  Lactic acidosis 5.4 > 4.9.  Cr elevated 1.63.  Platelets low at 29. Blood Cultures obtained Head CT negative for acute abnormality. Abdominal CT-stain for microabscesses in the spleen, consolidation suspicious for pneumonia, moderate to large left and small right pleural effusion, loops of bowel extending further down in distended scrotum well below the ischial tuberosities. IV Vanco cefepime and metronidazole started. During the hospitalization, the patient was noted to have AKI.  He was started on IV fluids.  His renal function did improve.  His BP overall improved with antibiotics and IV fluids.  The patient did require oxygenation up to 6 L.  This was gradually weaned down to 2 L. During the hospitalization, patient developed afib with RVR.  He was started on  diltiazem drip. Unfortunately, the patient remained encephalopathic throughout the hospitalization.  His mental status did not improve much.  In fact, his mentation showed a gradual decline.  He became more somnolent despite treatment of his sepsis, atrial fibrillation, and AKI. Subsequently, his somnolence and reduced ability to interact with his family as well as his ability to take any oral intake in a safe manner.  His oral intake continues to decline. Goals of care discussions were held with the patient's spouse and daughter.  We discussed a gradual decline in the patient's overall medical condition and high risk for repeated hospitalizations.  We discussed the patient's inability to take any oral intake in a safe manner.  The patient's spouse and daughter understood the patient's overall poor prognosis and that current medical therapy is prolonging the patient's suffering.  As such, the family wished to transition the patient's focus of care to comfort measures.     Assessment/Plan: Severe sepsis (HCC) Meeting severe sepsis with fever of 102, tachycardia heart rate 117-152, tachypnea respiratory rate 29-44, with evidence of multiple endorgan dysfunction, significant lactic acidosis of 5.4 > 4.9, encephalopathy, AKI, thrombocytopenia.  Etiology likely secondary to pneumonia -COVID, influenza negative. -Continue broad-spectrum antibiotics IV vancomycin cefepime and metronidazole -Follow up blood cultures--neg to date -urine cultures--neg -3 Liter LR bolus given, continue N/s 100cc/hr x 15hrs -Patient is a hospice patient.  ED provider, and myself have had extensive discussion with patient's family- spouse at bedside about expectation considering severity of illness, and goals of care. They are reasonable want IV antibiotics, fluids, and treating the treatable without aggressive interventions like  central lines, and I confirmed that patient's is DNR. Will Acute respiratory failure with  hypoxia -Initially on 6 L nasal cannula -Secondary to pneumonia and pleural effusions -Currently on 2 L nasal cannula -05/27/2023--after goals of care discussion, the patient's focus of care is being transitioned to comfort measures   Lobar Pneumonia -noted on CT abd/pelvis -initially on vanc, cefepime,  metronidazole -d/c vanc and metronidazole -05/27/2023--after goals of care discussion, the patient's focus of care is being transitioned to comfort measures   Pleural effusion -05/25/23 [place] chest--R>L pleural effusion -request right thora if cannot wean oxygen -discussed with spouse and she consents after discussion of risks, benefits, alternatives--now spouse does not want thora -05/27/2023--after goals of care discussion, the patient's focus of care is being transitioned to comfort measures   AKI -due to hemodynamic changes and volume depletion -baseline creatinine 0.9-1.1 -presented with serum creatinine 1.63 -Continued on IV fluids initially with some improvement -05/27/2023--after goals of care discussion, the patient's focus of care is being transitioned to comfort measures   Thrombocytopenia (HCC) Platelets 29, last checked 9 days ago platelets were 65, gradual decline over the past month from 160s. -acute drop in part due to acute infection -Check B12--879  Paroxysmal Atrial fibrillation with RVR (HCC) -Rate up to 140s,  -personally reviewed EKG-atrial fibrillation, nonsp STT changes.   -Not on anticoagulation due to history of acute blood loss anemias, recent hospitalization requiring 4 units PRBC 6/3-05/15/23.  -initially held metoprolol due to soft BPs -start dilitazem drip 6/15 -05/27/2023--after goals of care discussion, the patient's focus of care is being transitioned to comfort measures   Metastatic prostate cancer to bone -CT abdomen and pelvis on 01/11/2023 shows progression of his disease.   -Saw Dr. Ellin Saba 02/21/2023--no longer candidate for active  therapy/chemotherapy   Neoplastic associated pain -Continue oxycodone and TD fentanyl -PDMP reviewed--patient receives oxycodone 5 mg, 90, last refill 05/09/2023 -Fentanyl transdermal 12.5 mcg/h, last refill 05/03/2023 -Now that the patient is more somnolent, transition to IV Dilaudid for pain control   Acute metabolic encephalopathy -remains somonlent>> not safe oral intake -Not much improvement -due to infectious process -05/27/2023---after goals of care discussion, the patient's focus of care is being transitioned to comfort measures   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 Verdis Prime -Supportive care at this point -holding metoprolol due to low BPs initially   Essential hypertension -Holding amlodipine -holding metoprolol due to hypotension   Severe malnutrition -continue Ensure Plus   Acute anemia/Symptomatic anemia -Hgb down to 6.8 -transfused 2 units PRBC on 05/26/2023 -spouse does not want any invasive procedures -after goals of care discussion, the patient's focus of care is being transitioned to comfort measures  Goals of Care -05/27/2023-GOC discussion with the patient's spouse and daughter -Transition to full comfort measures       Family Communication: spouse updated 6/16   Consultants:  palliative   Code Status:  Full Comfort    DVT Prophylaxis:  SCDs     Procedures: As Listed in Progress Note Above   Antibiotics: Vanc 6/13>>6/15 Cefepime 6/13>> Metronidazole 6/13>>6/15            Subjective: Patient is somnolent.  Review of systems  not possible secondary to encephalopathy.  Objective: Vitals:   05/27/23 0700 05/27/23 0727 05/27/23 0800 05/27/23 1118  BP: (!) 162/93  133/62   Pulse: 91 89 86 87  Resp: (!) 30 (!) 28 (!) 28 (!) 27  Temp:  98.5 F (36.9 C)  99.2 F (37.3 C)  TempSrc:  Axillary  Axillary  SpO2: 92% 93% 95% 95%  Weight:      Height:        Intake/Output Summary (Last 24 hours) at 05/27/2023 1256 Last data filed at 05/27/2023 0800 Gross per 24 hour  Intake 1467.47 ml  Output 900 ml  Net 567.47 ml   Weight change: -0.2 kg Exam:  General:  Pt is awake, does not follow commands appropriately, not in acute distress HEENT: No icterus, No thrush, No neck mass, [place]/AT Cardiovascular: RRR, S1/S2, no rubs, no gallops Respiratory: Bilateral rales.  No wheezing. Abdomen: Soft/+BS, non tender, non distended, no guarding Extremities: No edema, No lymphangitis, No petechiae, No rashes, no synovitis   Data Reviewed: I have personally reviewed following labs and imaging studies Basic Metabolic Panel: Recent Labs  Lab 05/30/2023 1436 06/02/2023 1438 05/25/23 0508 05/26/23 0500 05/27/23 0348  NA 139 139 137 138 140  K 4.3 4.2 3.8 3.5 3.4*  CL 107 108 109 111 113*  CO2  --  17* 19* 19* 19*  GLUCOSE 171* 173* 141* 142* 148*  BUN 41* 43* 46* 51* 44*  CREATININE 1.70* 1.63* 1.29* 1.19 1.01  CALCIUM  --  6.9* 6.9* 7.1* 7.3*  MG  --   --   --   --  1.7   Liver Function Tests: Recent Labs  Lab 05/14/2023 1438 05/26/23 0500 05/27/23 0348  AST 117* 54* 31  ALT 21 21 18   ALKPHOS 1,945* 931* 784*  BILITOT 2.5* 1.3* 1.6*  PROT 4.6* 4.0* 3.8*  ALBUMIN 2.0* 1.5* 1.5*   No results for input(s): "LIPASE", "AMYLASE" in the last 168 hours. No results for input(s): "AMMONIA" in the last 168 hours. Coagulation Profile: Recent Labs  Lab 06/01/2023 1438  INR 1.7*   CBC: Recent Labs  Lab 05/18/2023 1436 05/23/2023 1438 05/25/23 0508 05/26/23 0500 05/27/23 0348  WBC  --  4.9 3.5* 4.7 4.8  NEUTROABS  --   2.8  --   --   --   HGB 7.5* 8.4* 7.5* 6.8* 9.4*  HCT 22.0* 26.2* 23.5* 21.0* 28.4*  MCV  --  90.7 89.4 89.7 87.7  PLT  --  29* 18* 19* 17*   Cardiac Enzymes: No results for input(s): "CKTOTAL", "CKMB", "CKMBINDEX", "TROPONINI" in the last 168 hours. BNP: Invalid input(s): "POCBNP" CBG: Recent Labs  Lab 05/26/23 2003 05/27/23 0003 05/27/23 0404 05/27/23 0726 05/27/23 1116  GLUCAP 150* 129* 141* 127* 91   HbA1C: No results for input(s): "HGBA1C" in the last 72 hours. Urine analysis:    Component Value Date/Time   COLORURINE AMBER (A) 05/13/2023 2049   APPEARANCEUR CLOUDY (A) 05/23/2023 2049   APPEARANCEUR Clear 08/31/2022 1551   LABSPEC 1.032 (H) 06/09/2023 2049   PHURINE 5.0 05/31/2023 2049   GLUCOSEU NEGATIVE 05/16/2023 2049   HGBUR LARGE (A) 06/07/2023 2049   BILIRUBINUR NEGATIVE 05/19/2023 2049   BILIRUBINUR Negative 08/31/2022 1551   KETONESUR NEGATIVE 05/15/2023 2049   PROTEINUR 100 (A) 05/28/2023 2049   NITRITE NEGATIVE 06/02/2023 2049   LEUKOCYTESUR TRACE (A) 05/25/2023 2049   Sepsis Labs: @LABRCNTIP (procalcitonin:4,lacticidven:4) ) Recent Results (from the past 240 hour(s))  Culture, blood (Routine x 2)  Status: None (Preliminary result)   Collection Time: 06/05/2023  2:38 PM   Specimen: BLOOD  Result Value Ref Range Status   Specimen Description BLOOD BLOOD RIGHT HAND  Final   Special Requests   Final    BOTTLES DRAWN AEROBIC AND ANAEROBIC Blood Culture adequate volume   Culture   Final    NO GROWTH 3 DAYS Performed at [facility], [address], [place], [place] [address]    Report Status PENDING  Incomplete  Culture, blood (Routine x 2)     Status: None (Preliminary result)   Collection Time: 06/06/2023  2:38 PM   Specimen: BLOOD  Result Value Ref Range Status   Specimen Description BLOOD BLOOD LEFT ARM  Final   Special Requests   Final    BOTTLES DRAWN AEROBIC AND ANAEROBIC Blood Culture adequate volume   Culture   Final    NO GROWTH 3  DAYS Performed at [facility], [address], [place], [place] [address]    Report Status PENDING  Incomplete  Resp panel by RT-PCR (RSV, Flu A&B, Covid) Anterior Nasal Swab     Status: None   Collection Time: 05/26/2023  3:03 PM   Specimen: Anterior Nasal Swab  Result Value Ref Range Status   SARS Coronavirus 2 by RT PCR NEGATIVE NEGATIVE Final    Comment: (NOTE) SARS-CoV-2 target nucleic acids are NOT DETECTED.  The SARS-CoV-2 RNA is generally detectable in upper respiratory specimens during the acute phase of infection. The lowest concentration of SARS-CoV-2 viral copies this assay can detect is 138 copies/mL. A negative result does not preclude SARS-Cov-2 infection and should not be used as the sole basis for treatment or other patient management decisions. A negative result may occur with  improper specimen collection/handling, submission of specimen other than nasopharyngeal swab, presence of viral mutation(s) within the areas targeted by this assay, and inadequate number of viral copies(<138 copies/mL). A negative result must be combined with clinical observations, patient history, and epidemiological information. The expected result is Negative.  Fact Sheet for Patients:  BloggerCourse.com  Fact Sheet for Healthcare Providers:  SeriousBroker.it  This test is no t yet approved or cleared by the [place] FDA and  has been authorized for detection and/or diagnosis of SARS-CoV-2 by FDA under an Emergency Use Authorization (EUA). This EUA will remain  in effect (meaning this test can be used) for the duration of the COVID-19 declaration under Section 564(b)(1) of the Act, 21 U.S.C.section 360bbb-3(b)(1), unless the authorization is terminated  or revoked sooner.       Influenza A by PCR NEGATIVE NEGATIVE Final   Influenza B by PCR NEGATIVE NEGATIVE Final    Comment: (NOTE) The Xpert Xpress SARS-CoV-2/FLU/RSV plus  assay is intended as an aid in the diagnosis of influenza from Nasopharyngeal swab specimens and should not be used as a sole basis for treatment. Nasal washings and aspirates are unacceptable for Xpert Xpress SARS-CoV-2/FLU/RSV testing.  Fact Sheet for Patients: BloggerCourse.com  Fact Sheet for Healthcare Providers: SeriousBroker.it  This test is not yet approved or cleared by the [place] FDA and has been authorized for detection and/or diagnosis of SARS-CoV-2 by FDA under an Emergency Use Authorization (EUA). This EUA will remain in effect (meaning this test can be used) for the duration of the COVID-19 declaration under Section 564(b)(1) of the Act, 21 U.S.C. section 360bbb-3(b)(1), unless the authorization is terminated or revoked.     Resp Syncytial Virus by PCR NEGATIVE NEGATIVE Final  Comment: (NOTE) Fact Sheet for Patients: BloggerCourse.com  Fact Sheet for Healthcare Providers: SeriousBroker.it  This test is not yet approved or cleared by the [place] FDA and has been authorized for detection and/or diagnosis of SARS-CoV-2 by FDA under an Emergency Use Authorization (EUA). This EUA will remain in effect (meaning this test can be used) for the duration of the COVID-19 declaration under Section 564(b)(1) of the Act, 21 U.S.C. section 360bbb-3(b)(1), unless the authorization is terminated or revoked.  Performed at [facility], [address] [place], [place] [address]   MRSA Next Gen by PCR, Nasal     Status: None   Collection Time: 06/09/2023  7:58 PM   Specimen: Nasal Mucosa; Nasal Swab  Result Value Ref Range Status   MRSA by PCR Next Gen NOT DETECTED NOT DETECTED Final    Comment: (NOTE) The GeneXpert MRSA Assay (FDA approved for NASAL specimens only), is one component of a comprehensive MRSA colonization surveillance program. It is not intended to  diagnose MRSA infection nor to guide or monitor treatment for MRSA infections. Test performance is not FDA approved in patients less than 84 years old. Performed at [facility], [address] [place], [place] [address]   Remove and replace urinary cath (placed > 5 days) then obtain urine culture from new indwelling urinary catheter.     Status: None   Collection Time: 05/15/2023  8:49 PM   Specimen: Urine, Catheterized  Result Value Ref Range Status   Specimen Description   Final    URINE, CATHETERIZED Performed at [facility], [address], [place], [place] [address]    Special Requests   Final    NONE Performed at [facility], [address] [place], [place] [address]    Culture   Final    NO GROWTH Performed at [facility], [address] [place], [place] [address]    Report Status 05/26/2023 FINAL  Final     Scheduled Meds:  Chlorhexidine Gluconate Cloth  6 each Topical Daily   feeding supplement  237 mL Oral BID BM   fentaNYL  1 patch Transdermal Q72H   folic acid  1 mg Oral Daily   insulin aspart  0-9 Units Subcutaneous Q4H   Continuous Infusions:  ceFEPime (MAXIPIME) IV 2 g (05/27/23 0240)   diltiazem (CARDIZEM) infusion 5 mg/hr (05/27/23 1134)    Procedures/Studies: ECHOCARDIOGRAM COMPLETE  Result Date: 05/27/2023    ECHOCARDIOGRAM REPORT   Patient Name:   Alan [name]. Date of Exam: 05/27/2023 Medical Rec #:  [id number]      Height:       65.0 in Accession #:    [id number]     Weight:       141.1 lb Date of Birth:  [birth year]      BSA:          1.706 m Patient Age:    82 years       BP:           141/59 mmHg Patient Gender: M              HR:           90 bpm. [facility] Procedure: 2D Echo, Cardiac Doppler and Color Doppler Indications:    Atrial Fibrillation I48.91  History:        Patient has prior history of Echocardiogram examinations, most  recent 05/16/2017. Arrythmias:Atrial Fibrillation; Risk                  Factors:Hypertension, Diabetes, Dyslipidemia and Former Smoker.  Sonographer:    Celesta Gentile RCS Referring Phys: [id number] Nguyen Todorov  Sonographer Comments: Image acquisition challenging due to uncooperative patient. IMPRESSIONS  1. Limited windows. Left ventricular ejection fraction, by estimation, is 55 to 60%. The left ventricle has normal function. The left ventricle has no regional wall motion abnormalities. There is moderate asymmetric left ventricular hypertrophy of the basal-septal segment. Left ventricular diastolic parameters are consistent with Grade I diastolic dysfunction (impaired relaxation).  2. Right ventricular systolic function is normal. The right ventricular size is normal. Tricuspid regurgitation signal is inadequate for assessing PA pressure.  3. Left atrial size was moderately dilated.  4. Moderate pleural effusion.  5. The mitral valve is normal in structure. No evidence of mitral valve regurgitation.  6. The aortic valve is grossly normal. Aortic valve regurgitation is not visualized.  7. The inferior vena cava is normal in size with greater than 50% respiratory variability, suggesting right atrial pressure of 3 mmHg. FINDINGS  Left Ventricle: Limited windows. Left ventricular ejection fraction, by estimation, is 55 to 60%. The left ventricle has normal function. The left ventricle has no regional wall motion abnormalities. The left ventricular internal cavity size was normal in size. There is moderate asymmetric left ventricular hypertrophy of the basal-septal segment. Left ventricular diastolic parameters are consistent with Grade I diastolic dysfunction (impaired relaxation). Right Ventricle: The right ventricular size is normal. Right ventricular systolic function is normal. Tricuspid regurgitation signal is inadequate for assessing PA pressure. Left Atrium: Left atrial size was moderately dilated. Right Atrium: Right atrial size was normal in size. Pericardium: Trivial pericardial effusion  is present. Mitral Valve: The mitral valve is normal in structure. No evidence of mitral valve regurgitation. Tricuspid Valve: Tricuspid valve regurgitation is not demonstrated. Aortic Valve: The aortic valve is grossly normal. Aortic valve regurgitation is not visualized. Pulmonic Valve: Pulmonic valve regurgitation is not visualized. Aorta: The aortic root and ascending aorta are structurally normal, with no evidence of dilitation. Venous: The inferior vena cava is normal in size with greater than 50% respiratory variability, suggesting right atrial pressure of 3 mmHg. IAS/Shunts: No atrial level shunt detected by color flow Doppler. Additional Comments: There is a moderate pleural effusion.  LEFT VENTRICLE PLAX 2D LVIDd:         3.40 cm   Diastology LVIDs:         2.40 cm   LV e' medial:    6.20 cm/s LV PW:         1.30 cm   LV E/e' medial:  7.5 LV IVS:        1.70 cm   LV e' lateral:   7.94 cm/s LVOT diam:     2.30 cm   LV E/e' lateral: 5.9 LV SV:         73 LV SV Index:   43 LVOT Area:     4.15 cm  RIGHT VENTRICLE RV S prime:     19.50 cm/s TAPSE (M-mode): 2.4 cm LEFT ATRIUM             Index        RIGHT ATRIUM           Index LA diam:        3.40 cm 1.99 cm/m   RA Area:     15.60 cm LA Vol (A2C):  78.2 ml 45.85 ml/m  RA Volume:   41.30 ml  24.22 ml/m LA Vol (A4C):   71.8 ml 42.10 ml/m LA Biplane Vol: 81.6 ml 47.84 ml/m  AORTIC VALVE LVOT Vmax:   97.90 cm/s LVOT Vmean:  71.100 cm/s LVOT VTI:    0.176 m  AORTA Ao Root diam: 3.90 cm MITRAL VALVE MV Area (PHT): 2.84 cm    SHUNTS MV Decel Time: 268 msec    Systemic VTI:  0.18 m MV E velocity: 46.60 cm/s  Systemic Diam: 2.30 cm MV A velocity: 74.05 cm/s MV E/A ratio:  0.63 [place] signed by Carolan Clines Signature Date/Time: 05/27/2023/10:53:05 AM    Final    [place] CHEST (PLEURAL EFFUSION)  Result Date: 05/25/2023 CLINICAL DATA:  Pleural effusion. EXAM: CHEST ULTRASOUND COMPARISON:  Chest x-ray from yesterday. FINDINGS: Ultrasound  demonstrates moderate left and small right pleural effusions. IMPRESSION: 1. Moderate left and small right pleural effusions. Electronically Signed   By: Obie Dredge M.D.   On: 05/25/2023 15:38   CT ABDOMEN PELVIS W CONTRAST  Result Date: 05/28/2023 CLINICAL DATA:  Metastatic prostate cancer. Sepsis, altered mental status. * Tracking Code: BO * EXAM: CT ABDOMEN AND PELVIS WITH CONTRAST TECHNIQUE: Multidetector CT imaging of the abdomen and pelvis was performed using the standard protocol following bolus administration of intravenous contrast. RADIATION DOSE REDUCTION: This exam was performed according to the departmental dose-optimization program which includes automated exposure control, adjustment of the mA and/or kV according to patient size and/or use of iterative reconstruction technique. CONTRAST:  75mL OMNIPAQUE IOHEXOL 300 MG/ML  SOLN COMPARISON:  01/11/2023 FINDINGS: Lower chest: Airspace filling consolidation appearance in the right lower lobe. Passive atelectasis in the left lower lobe. Moderate to large left and small right pleural effusion. Descending thoracic aortic atherosclerosis. Hepatobiliary: Heterogeneous enhancement in the liver with higher density peripherally, probably related to an unusual distribution of steatosis although hepatitis might also be considered. Cholecystectomy. No biliary dilatation or pneumobilia. Pancreas: Pancreas is somewhat blurred by motion artifact but there continues to be dilatation of the dorsal pancreatic duct up to 5 mm in diameter, not substantially changed from 01/11/2023. 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 Spleen: The spleen measures 11.3 by 7.9 by 11.8 cm (volume = 550 cm^3) and demonstrates multiple new small hypodense lesions which were not present on 01/11/2023, concerning for  microabscesses given the clinical context. Splenic involvement would be an unusual pathway for spread of prostate cancer. Adrenals/Urinary Tract: Bilateral thickened adrenal glands, increased from prior. The kidneys appear unremarkable. Foley catheter in the otherwise empty urinary bladder. Stomach/Bowel: Small type 1 hiatal hernia. Suspected varices along the margin of the proximal stomach. Large indirect right inguinal hernia containing multiple loops of small bowel, the bottom of the cecum, and the appendix. Because these loops of bowel extend far down in the distended scrotum and well below the ischial tuberosities, the entirety of the bowel down in the scrotum is not included. Sigmoid colon diverticulosis. Moderate prominence of rectal stool ball. 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 Atherosclerosis is present, including aortoiliac atherosclerotic disease. There is atheromatous plaque proximally in the celiac trunk and SMA without overt occlusion. Reproductive: Brachytherapy seed implants in the indistinctly marginated prostate gland. Other: Small amount of edema along the root of the mesentery. Mild nonspecific presacral  edema. Musculoskeletal: Diffuse patchy bony sclerosis compatible with widespread osseous metastatic disease. Lumbar spondylosis causes mild multilevel foraminal impingement. Large right indirect inguinal hernia containing loops of bowel including small bowel loops and the appendix. A small indirect left inguinal hernia contains adipose tissue. IMPRESSION: 1. Multiple new small hypodense lesions in the spleen, concerning for microabscesses given the clinical context. 2. Airspace filling consolidation in the right lower lobe, suspicious for pneumonia. 3. Moderate to large left and small right pleural effusion. 4. Heterogeneous enhancement in the liver with higher density peripherally, probably related to an unusual  distribution of steatosis although hepatitis might also be considered. 5. 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 6. Large indirect right inguinal hernia containing multiple loops of small bowel, the bottom of the cecum, and the appendix. Because these loops of bowel extend far down in the distended scrotum and well below the ischial tuberosities, the entirety of the bowel down in the scrotum is not included. 7. Sigmoid colon diverticulosis. 8. Small type 1 hiatal hernia. 9. 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 10. Brachytherapy seed implants in the indistinctly marginated prostate gland. 11. Aortic atherosclerosis. Aortic Atherosclerosis (ICD10-I70.0). Electronically Signed   By: Gaylyn Rong M.D.   On: 05/27/2023 17:44   CT Head Wo Contrast  Result Date: 05/13/2023 CLINICAL DATA:  Altered mental status EXAM: CT HEAD WITHOUT CONTRAST TECHNIQUE: Contiguous axial images were obtained from the base of the skull through the vertex without intravenous contrast. RADIATION DOSE REDUCTION: This exam was performed according to the departmental dose-optimization program which includes automated exposure control, adjustment of the mA and/or kV according to patient size and/or use of iterative reconstruction technique. COMPARISON:  None Available. FINDINGS: Brain: No acute intracranial findings are seen. There are no signs of bleeding within the cranium. Dense calcifications are seen in basal ganglia on both sides. Cortical sulci are prominent. There is decreased density in periventricular white matter. Vascular: There are scattered coarse arterial calcifications. Skull: No acute findings are seen. Sinuses/Orbits: Unremarkable. Other: None. IMPRESSION: No acute intracranial findings are seen. Atrophy. Small-vessel disease. Electronically Signed   By:  Ernie Avena M.D.   On: 05/19/2023 17:32   DG Chest Portable 1 View  Result Date: 06/04/2023 CLINICAL DATA:  Altered mental status.  History of prostate cancer EXAM: PORTABLE CHEST 1 VIEW COMPARISON:  X-ray 03/27/2022 and older FINDINGS: Developing small left effusion and adjacent lung opacity. Possible infiltrate. No pneumothorax or edema. Normal cardiopericardial silhouette. Overlapping cardiac leads. Surgical clips in the right upper quadrant. Scattered areas of bony sclerosis identified consistent with known osseous metastatic disease from prostate cancer. IMPRESSION: New small left effusion and adjacent opacity. Possible infiltrate. Recommend follow-up Electronically Signed   By: Karen Kays M.D.   On: 05/28/2023 15:15    Catarina Hartshorn, DO  [facility]  If 7PM-7AM, please contact night-coverage [email or web address] Password [email or web address] 05/27/2023, 12:56 PM   LOS: 3 days

## 2023-05-27 NOTE — Progress Notes (Signed)
Attempted to feed patient. Tried one spoon full of coffee and patient could not swallow and chocked on it. Tried some food and could not chew.

## 2023-05-27 NOTE — Progress Notes (Signed)
   05/27/23 0514  Provider Notification  Provider Name/Title Dr. Thomes Dinning  Date Provider Notified 05/27/23  Time Provider Notified [phone]  Method of Notification Page  Notification Reason Critical Result  Test performed and critical result Platelets 17  Date Critical Result Received 05/27/23  Time Critical Result Received 0500

## 2023-05-27 NOTE — Progress Notes (Signed)
*  PRELIMINARY RESULTS* Echocardiogram 2D Echocardiogram has been performed.  Stacey Drain 05/27/2023, 9:42 AM

## 2023-05-28 ENCOUNTER — Telehealth: Payer: Self-pay | Admitting: Family Medicine

## 2023-05-28 DIAGNOSIS — Z7189 Other specified counseling: Secondary | ICD-10-CM

## 2023-05-28 DIAGNOSIS — J181 Lobar pneumonia, unspecified organism: Secondary | ICD-10-CM | POA: Diagnosis not present

## 2023-05-28 DIAGNOSIS — G9341 Metabolic encephalopathy: Secondary | ICD-10-CM | POA: Diagnosis not present

## 2023-05-28 DIAGNOSIS — A419 Sepsis, unspecified organism: Secondary | ICD-10-CM | POA: Diagnosis not present

## 2023-05-28 DIAGNOSIS — J189 Pneumonia, unspecified organism: Secondary | ICD-10-CM | POA: Diagnosis not present

## 2023-05-28 DIAGNOSIS — C61 Malignant neoplasm of prostate: Secondary | ICD-10-CM

## 2023-05-28 DIAGNOSIS — I4891 Unspecified atrial fibrillation: Secondary | ICD-10-CM | POA: Diagnosis not present

## 2023-05-28 LAB — CULTURE, BLOOD (ROUTINE X 2): Special Requests: ADEQUATE

## 2023-05-28 LAB — GLUCOSE, CAPILLARY: Glucose-Capillary: 132 mg/dL — ABNORMAL HIGH (ref 70–99)

## 2023-05-28 MED ORDER — HYDROMORPHONE HCL 1 MG/ML IJ SOLN
0.5000 mg | INTRAMUSCULAR | Status: DC
  Administered 2023-05-28: 0.5 mg via INTRAVENOUS
  Filled 2023-05-28 (×2): qty 0.5

## 2023-05-28 MED ORDER — LORAZEPAM 1 MG PO TABS: 1.0000 mg | ORAL_TABLET | ORAL | Status: DC | PRN

## 2023-05-28 MED ORDER — GLYCOPYRROLATE 1 MG PO TABS: 1.0000 mg | ORAL_TABLET | ORAL | Status: DC | PRN

## 2023-05-28 MED ORDER — POLYVINYL ALCOHOL 1.4 % OP SOLN: 1.0000 [drp] | Freq: Four times a day (QID) | OPHTHALMIC | Status: DC | PRN

## 2023-05-28 MED ORDER — LORAZEPAM 2 MG/ML IJ SOLN: 1.0000 mg | INTRAMUSCULAR | Status: DC | PRN

## 2023-05-28 MED ORDER — GLYCOPYRROLATE 0.2 MG/ML IJ SOLN: 0.2000 mg | INTRAMUSCULAR | Status: DC | PRN

## 2023-05-28 MED ORDER — HALOPERIDOL 0.5 MG PO TABS: 0.5000 mg | ORAL_TABLET | ORAL | Status: DC | PRN

## 2023-05-28 MED ORDER — HYDROMORPHONE HCL 1 MG/ML IJ SOLN: 0.5000 mg | INTRAMUSCULAR | Status: DC | PRN

## 2023-05-28 MED ORDER — HALOPERIDOL LACTATE 5 MG/ML IJ SOLN: 0.5000 mg | INTRAMUSCULAR | Status: DC | PRN

## 2023-05-28 MED ORDER — HALOPERIDOL LACTATE 2 MG/ML PO CONC: 0.5000 mg | ORAL | Status: DC | PRN

## 2023-05-28 NOTE — Telephone Encounter (Signed)
Patient is currently hospitalized, not doing well, just keeping him comfortable.  Doctors say could be just hours or days for patient.

## 2023-05-28 NOTE — Plan of Care (Signed)
Problem: Education: Goal: Knowledge of General Education information will improve Description: Including pain rating scale, medication(s)/side effects and non-pharmacologic comfort measures Outcome: Not Progressing   Problem: Health Behavior/Discharge Planning: Goal: Ability to manage health-related needs will improve Outcome: Not Progressing   Problem: Clinical Measurements: Goal: Ability to maintain clinical measurements within normal limits will improve Outcome: Not Progressing Goal: Will remain free from infection Outcome: Not Progressing Goal: Diagnostic test results will improve Outcome: Not Progressing Goal: Respiratory complications will improve Outcome: Not Progressing Goal: Cardiovascular complication will be avoided Outcome: Not Progressing   Problem: Activity: Goal: Risk for activity intolerance will decrease Outcome: Not Progressing   Problem: Nutrition: Goal: Adequate nutrition will be maintained Outcome: Not Progressing   Problem: Coping: Goal: Level of anxiety will decrease Outcome: Not Progressing   Problem: Elimination: Goal: Will not experience complications related to bowel motility Outcome: Not Progressing Goal: Will not experience complications related to urinary retention Outcome: Not Progressing   Problem: Pain Managment: Goal: General experience of comfort will improve Outcome: Not Progressing   Problem: Safety: Goal: Ability to remain free from injury will improve Outcome: Not Progressing   Problem: Skin Integrity: Goal: Risk for impaired skin integrity will decrease Outcome: Not Progressing   Problem: Education: Goal: Knowledge of disease or condition will improve Outcome: Not Progressing Goal: Understanding of medication regimen will improve Outcome: Not Progressing Goal: Individualized Educational Video(s) Outcome: Not Progressing   Problem: Activity: Goal: Ability to tolerate increased activity will improve Outcome: Not  Progressing   Problem: Cardiac: Goal: Ability to achieve and maintain adequate cardiopulmonary perfusion will improve Outcome: Not Progressing   Problem: Health Behavior/Discharge Planning: Goal: Ability to safely manage health-related needs after discharge will improve Outcome: Not Progressing   Problem: Education: Goal: Ability to describe self-care measures that may prevent or decrease complications (Diabetes Survival Skills Education) will improve Outcome: Not Progressing Goal: Individualized Educational Video(s) Outcome: Not Progressing   Problem: Coping: Goal: Ability to adjust to condition or change in health will improve Outcome: Not Progressing   Problem: Fluid Volume: Goal: Ability to maintain a balanced intake and output will improve Outcome: Not Progressing   Problem: Health Behavior/Discharge Planning: Goal: Ability to identify and utilize available resources and services will improve Outcome: Not Progressing Goal: Ability to manage health-related needs will improve Outcome: Not Progressing   Problem: Metabolic: Goal: Ability to maintain appropriate glucose levels will improve Outcome: Not Progressing   Problem: Nutritional: Goal: Maintenance of adequate nutrition will improve Outcome: Not Progressing Goal: Progress toward achieving an optimal weight will improve Outcome: Not Progressing   Problem: Skin Integrity: Goal: Risk for impaired skin integrity will decrease Outcome: Not Progressing   Problem: Tissue Perfusion: Goal: Adequacy of tissue perfusion will improve Outcome: Not Progressing   Problem: Education: Goal: Ability to describe self-care measures that may prevent or decrease complications (Diabetes Survival Skills Education) will improve Outcome: Not Progressing Goal: Individualized Educational Video(s) Outcome: Not Progressing   Problem: Coping: Goal: Ability to adjust to condition or change in health will improve Outcome: Not  Progressing   Problem: Fluid Volume: Goal: Ability to maintain a balanced intake and output will improve Outcome: Not Progressing   Problem: Health Behavior/Discharge Planning: Goal: Ability to identify and utilize available resources and services will improve Outcome: Not Progressing Goal: Ability to manage health-related needs will improve Outcome: Not Progressing   Problem: Metabolic: Goal: Ability to maintain appropriate glucose levels will improve Outcome: Not Progressing   Problem: Education: Goal: Knowledge of disease or condition  will improve Outcome: Not Progressing Goal: Understanding of medication regimen will improve Outcome: Not Progressing Goal: Individualized Educational Video(s) Outcome: Not Progressing   Problem: Activity: Goal: Ability to tolerate increased activity will improve Outcome: Not Progressing   Problem: Cardiac: Goal: Ability to achieve and maintain adequate cardiopulmonary perfusion will improve Outcome: Not Progressing   Problem: Health Behavior/Discharge Planning: Goal: Ability to safely manage health-related needs after discharge will improve Outcome: Not Progressing

## 2023-05-28 NOTE — Progress Notes (Signed)
PROGRESS NOTE  Alan [name]. Alan [name]:[id number] DOB: 14-May-1941 DOA: 05/23/2023 PCP: Alan [name], Alan Radon, MD  Brief History:  82 year old male with a history of metastatic prostate cancer to bone, hypertrophic cardiomyopathy, diabetes mellitus type 2, hypertension, hyperlipidemia, paroxysmal atrial fibrillation, right lower extremity DVT presenting with Patient is currently hospice patient.  He was brought to the ED with complaints of altered mental status over the past 2 days.  His last meal was about 2 days ago, he has not eaten since or drunk since then and has been confused.  In the emergency department, the patient was somnolent with his eyes closed.  He was able to open his eyes with touch but unable to provide any history.  Spouse denies any vomiting, diarrhea, hematochezia, hematemesis.  He has dark stools chronically. Recent hospitalization 6/3 to 05/15/2023 for symptomatic acute blood loss anemia, required 4 units PRBC, patient declined aggressive care and endoscopy.   ED Course: Febrile to 102, tachycardic heart rate initially up to 150s improved to 120s, tachypneic heart rate 29-49.  Hypotensive down to 73/45.  WBC 4.9.  Lactic acidosis 5.4 > 4.9.  Cr elevated 1.63.  Platelets low at 29. Blood Cultures obtained Head CT negative for acute abnormality. Abdominal CT-stain for microabscesses in the spleen, consolidation suspicious for pneumonia, moderate to large left and small right pleural effusion, loops of bowel extending further down in distended scrotum well below the ischial tuberosities. IV Vanco cefepime and metronidazole started. During the hospitalization, the patient was noted to have AKI.  He was started on IV fluids.  His renal function did improve.  His BP overall improved with antibiotics and IV fluids.  The patient did require oxygenation up to 6 L.  This was gradually weaned down to 2 L. During the hospitalization, patient developed afib with RVR.  He was started on  diltiazem drip. Unfortunately, the patient remained encephalopathic throughout the hospitalization.  His mental status did not improve much.  In fact, his mentation showed a gradual decline.  He became more somnolent despite treatment of his sepsis, atrial fibrillation, and AKI. Subsequently, his somnolence and reduced ability to interact with his family as well as his ability to take any oral intake in a safe manner.  His oral intake continues to decline. Goals of care discussions were held with the patient's spouse and daughter.  We discussed a gradual decline in the patient's overall medical condition and high risk for repeated hospitalizations.  We discussed the patient's inability to take any oral intake in a safe manner.  The patient's spouse and daughter understood the patient's overall poor prognosis and that current medical therapy is prolonging the patient's suffering.  As such, the family wished to transition the patient's focus of care to comfort measures.     Assessment/Plan: Severe sepsis (HCC) Meeting severe sepsis with fever of 102, tachycardia heart rate 117-152, tachypnea respiratory rate 29-44, with evidence of multiple endorgan dysfunction, significant lactic acidosis of 5.4 > 4.9, encephalopathy, AKI, thrombocytopenia.  Etiology likely secondary to pneumonia -COVID, influenza negative. -Continue broad-spectrum antibiotics IV vancomycin cefepime and metronidazole -Follow up blood cultures--neg to date -urine cultures--neg -3 Liter LR bolus given, continue N/s 100cc/hr x 15hrs -Patient is a hospice patient.  ED provider, and myself have had extensive discussion with patient's family- spouse at bedside about expectation considering severity of illness, and goals of care. They are reasonable want IV antibiotics, fluids, and treating the treatable without aggressive interventions like  central lines, and I confirmed that patient's is DNR initially -unfortunately, he did not improve  much clinically with abx.  His overall medical condition showed a gradual decline -05/27/2023--after goals of care discussion, the patient's focus of care is being transitioned to comfort measures  Acute respiratory failure with hypoxia -Initially on 6 L nasal cannula -Secondary to pneumonia and pleural effusions -Currently on 2 L nasal cannula -05/27/2023--after goals of care discussion, the patient's focus of care is being transitioned to comfort measures   Lobar Pneumonia -noted on CT abd/pelvis -initially on vanc, cefepime,  metronidazole -d/c vanc and metronidazole -05/27/2023--after goals of care discussion, the patient's focus of care is being transitioned to comfort measures   Pleural effusion -05/25/23 [place] chest--R>L pleural effusion -request right thora if cannot wean oxygen -discussed with spouse and she consents after discussion of risks, benefits, alternatives--now spouse does not want thora -05/27/2023--after goals of care discussion, the patient's focus of care is being transitioned to comfort measures   AKI -due to hemodynamic changes and volume depletion -baseline creatinine 0.9-1.1 -presented with serum creatinine 1.63 -Continued on IV fluids initially with some improvement -05/27/2023--after goals of care discussion, the patient's focus of care is being transitioned to comfort measures   Thrombocytopenia (HCC) Platelets 29, last checked 9 days ago platelets were 65, gradual decline over the past month from 160s. -acute drop in part due to acute infection -Check B12--879  Paroxysmal Atrial fibrillation with RVR (HCC) -Rate up to 140s,  -personally reviewed EKG-atrial fibrillation, nonsp STT changes.   -Not on anticoagulation due to history of acute blood loss anemias, recent hospitalization requiring 4 units PRBC 6/3-05/15/23.  -initially held metoprolol due to soft BPs -start dilitazem drip 6/15 -05/27/2023--after goals of care discussion, the patient's focus of care  is being transitioned to comfort measures   Metastatic prostate cancer to bone -CT abdomen and pelvis on 01/11/2023 shows progression of his disease.   -Saw Dr. Ellin Saba 02/21/2023--no longer candidate for active therapy/chemotherapy   Neoplastic associated pain -Continue oxycodone and TD fentanyl -PDMP reviewed--patient receives oxycodone 5 mg, 90, last refill 05/09/2023 -Fentanyl transdermal 12.5 mcg/h, last refill 05/03/2023 -Now that the patient is more somnolent, transition to IV Dilaudid for pain control   Acute metabolic encephalopathy -remains somonlent>> not safe oral intake -Not much improvement -due to infectious process -05/27/2023---after goals of care discussion, the patient's focus of care is being transitioned to comfort measures   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 Verdis Prime -Supportive care at this point -holding metoprolol due to low BPs initially   Essential hypertension -Holding amlodipine -holding metoprolol due to hypotension   Severe malnutrition -continue Ensure Plus   Acute anemia/Symptomatic anemia -Hgb down to 6.8 -transfused 2 units PRBC on 05/26/2023 -spouse does not want any invasive procedures -after goals of care discussion, the patient's focus of care is being transitioned to comfort measures   Goals of Care -05/27/2023-GOC discussion with the patient's spouse and daughter -Transition to full comfort measures         Family Communication: spouse updated 6/17   Consultants:  palliative   Code Status:  Full Comfort     DVT  Prophylaxis:  SCDs     Procedures: As Listed in Progress Note Above   Antibiotics: Vanc 6/13>>6/15 Cefepime 6/13>>6/17 Metronidazole 6/13>>6/15        Subjective: Patient opens eyes, but does not respond to questions.  ROS not possible due to encephalopathy.  Objective: Vitals:   05/28/23 0700 05/28/23 0730 05/28/23 0800 05/28/23 0900  BP: (!) 149/65  (!) 136/48 (!) 147/48  Pulse: 86  93 98  Resp: (!) 28  (!) 26 (!) 33  Temp:  98.6 F (37 C)    TempSrc:  Oral    SpO2: 93%  93% 94%  Weight:      Height:        Intake/Output Summary (Last 24 hours) at 05/28/2023 0914 Last data filed at 05/28/2023 0600 Gross per 24 hour  Intake 310.01 ml  Output 475 ml  Net -164.99 ml   Weight change: 0 kg Exam:  General:  Pt is somnolent,  does not follow commands appropriately, not in acute distress HEENT: No icterus, No thrush, No neck mass, [place]/AT Cardiovascular: RRR, S1/S2, no rubs, no gallops Respiratory: bilateral crackles.  No wheeze Abdomen: Soft/+BS, non tender, non distended, no guarding Extremities: trace LE edema, No lymphangitis, No petechiae, No rashes, no synovitis   Data Reviewed: I have personally reviewed following labs and imaging studies Basic Metabolic Panel: Recent Labs  Lab 06/06/2023 1436 05/28/2023 1438 05/25/23 0508 05/26/23 0500 05/27/23 0348  NA 139 139 137 138 140  K 4.3 4.2 3.8 3.5 3.4*  CL 107 108 109 111 113*  CO2  --  17* 19* 19* 19*  GLUCOSE 171* 173* 141* 142* 148*  BUN 41* 43* 46* 51* 44*  CREATININE 1.70* 1.63* 1.29* 1.19 1.01  CALCIUM  --  6.9* 6.9* 7.1* 7.3*  MG  --   --   --   --  1.7   Liver Function Tests: Recent Labs  Lab 06/03/2023 1438 05/26/23 0500 05/27/23 0348  AST 117* 54* 31  ALT 21 21 18   ALKPHOS 1,945* 931* 784*  BILITOT 2.5* 1.3* 1.6*  PROT 4.6* 4.0* 3.8*  ALBUMIN 2.0* 1.5* 1.5*   No results for input(s): "LIPASE", "AMYLASE" in the last 168 hours. No results for input(s): "AMMONIA" in the last 168  hours. Coagulation Profile: Recent Labs  Lab 05/12/2023 1438  INR 1.7*   CBC: Recent Labs  Lab 06/04/2023 1436 05/28/2023 1438 05/25/23 0508 05/26/23 0500 05/27/23 0348  WBC  --  4.9 3.5* 4.7 4.8  NEUTROABS  --  2.8  --   --   --   HGB 7.5* 8.4* 7.5* 6.8* 9.4*  HCT 22.0* 26.2* 23.5* 21.0* 28.4*  MCV  --  90.7 89.4 89.7 87.7  PLT  --  29* 18* 19* 17*   Cardiac Enzymes: No results for input(s): "CKTOTAL", "CKMB", "CKMBINDEX", "TROPONINI" in the last 168 hours. BNP: Invalid input(s): "POCBNP" CBG: Recent Labs  Lab 05/26/23 2003 05/27/23 0003 05/27/23 0404 05/27/23 0726 05/27/23 1116  GLUCAP 150* 129* 141* 127* 91   HbA1C: No results for input(s): "HGBA1C" in the last 72 hours. Urine analysis:    Component Value Date/Time   COLORURINE AMBER (A) 05/23/2023 2049   APPEARANCEUR CLOUDY (A) 05/27/2023 2049   APPEARANCEUR Clear 08/31/2022 1551   LABSPEC 1.032 (H) 06/04/2023 2049   PHURINE 5.0 05/28/2023 2049   GLUCOSEU NEGATIVE 05/26/2023 2049   HGBUR LARGE (A) 05/16/2023 2049   BILIRUBINUR NEGATIVE 06/05/2023 2049   BILIRUBINUR Negative 08/31/2022 1551   KETONESUR NEGATIVE 06/01/2023 2049  PROTEINUR 100 (A) 05/14/2023 2049   NITRITE NEGATIVE 05/27/2023 2049   LEUKOCYTESUR TRACE (A) 05/30/2023 2049   Sepsis Labs: @LABRCNTIP (procalcitonin:4,lacticidven:4) ) Recent Results (from the past 240 hour(s))  Culture, blood (Routine x 2)     Status: None (Preliminary result)   Collection Time: 05/25/2023  2:38 PM   Specimen: BLOOD  Result Value Ref Range Status   Specimen Description BLOOD BLOOD RIGHT HAND  Final   Special Requests   Final    BOTTLES DRAWN AEROBIC AND ANAEROBIC Blood Culture adequate volume   Culture   Final    NO GROWTH 4 DAYS Performed at [facility], [address], [place], [place] [address]    Report Status PENDING  Incomplete  Culture, blood (Routine x 2)     Status: None (Preliminary result)   Collection Time: 05/27/2023  2:38 PM   Specimen: BLOOD   Result Value Ref Range Status   Specimen Description BLOOD BLOOD LEFT ARM  Final   Special Requests   Final    BOTTLES DRAWN AEROBIC AND ANAEROBIC Blood Culture adequate volume   Culture   Final    NO GROWTH 4 DAYS Performed at [facility], [address], [place], [place] [address]    Report Status PENDING  Incomplete  Resp panel by RT-PCR (RSV, Flu A&B, Covid) Anterior Nasal Swab     Status: None   Collection Time: 05/13/2023  3:03 PM   Specimen: Anterior Nasal Swab  Result Value Ref Range Status   SARS Coronavirus 2 by RT PCR NEGATIVE NEGATIVE Final    Comment: (NOTE) SARS-CoV-2 target nucleic acids are NOT DETECTED.  The SARS-CoV-2 RNA is generally detectable in upper respiratory specimens during the acute phase of infection. The lowest concentration of SARS-CoV-2 viral copies this assay can detect is 138 copies/mL. A negative result does not preclude SARS-Cov-2 infection and should not be used as the sole basis for treatment or other patient management decisions. A negative result may occur with  improper specimen collection/handling, submission of specimen other than nasopharyngeal swab, presence of viral mutation(s) within the areas targeted by this assay, and inadequate number of viral copies(<138 copies/mL). A negative result must be combined with clinical observations, patient history, and epidemiological information. The expected result is Negative.  Fact Sheet for Patients:  BloggerCourse.com  Fact Sheet for Healthcare Providers:  SeriousBroker.it  This test is no t yet approved or cleared by the [place] FDA and  has been authorized for detection and/or diagnosis of SARS-CoV-2 by FDA under an Emergency Use Authorization (EUA). This EUA will remain  in effect (meaning this test can be used) for the duration of the COVID-19 declaration under Section 564(b)(1) of the Act, 21 U.S.C.section 360bbb-3(b)(1), unless  the authorization is terminated  or revoked sooner.       Influenza A by PCR NEGATIVE NEGATIVE Final   Influenza B by PCR NEGATIVE NEGATIVE Final    Comment: (NOTE) The Xpert Xpress SARS-CoV-2/FLU/RSV plus assay is intended as an aid in the diagnosis of influenza from Nasopharyngeal swab specimens and should not be used as a sole basis for treatment. Nasal washings and aspirates are unacceptable for Xpert Xpress SARS-CoV-2/FLU/RSV testing.  Fact Sheet for Patients: BloggerCourse.com  Fact Sheet for Healthcare Providers: SeriousBroker.it  This test is not yet approved or cleared by the [place] FDA and has been authorized for detection and/or diagnosis of SARS-CoV-2 by FDA under an Emergency Use Authorization (EUA). This EUA will remain in effect (meaning this test can  be used) for the duration of the COVID-19 declaration under Section 564(b)(1) of the Act, 21 U.S.C. section 360bbb-3(b)(1), unless the authorization is terminated or revoked.     Resp Syncytial Virus by PCR NEGATIVE NEGATIVE Final    Comment: (NOTE) Fact Sheet for Patients: BloggerCourse.com  Fact Sheet for Healthcare Providers: SeriousBroker.it  This test is not yet approved or cleared by the [place] FDA and has been authorized for detection and/or diagnosis of SARS-CoV-2 by FDA under an Emergency Use Authorization (EUA). This EUA will remain in effect (meaning this test can be used) for the duration of the COVID-19 declaration under Section 564(b)(1) of the Act, 21 U.S.C. section 360bbb-3(b)(1), unless the authorization is terminated or revoked.  Performed at [facility], [address] [place], [place] [address]   MRSA Next Gen by PCR, Nasal     Status: None   Collection Time: 05/13/2023  7:58 PM   Specimen: Nasal Mucosa; Nasal Swab  Result Value Ref Range Status   MRSA by PCR Next Gen NOT  DETECTED NOT DETECTED Final    Comment: (NOTE) The GeneXpert MRSA Assay (FDA approved for NASAL specimens only), is one component of a comprehensive MRSA colonization surveillance program. It is not intended to diagnose MRSA infection nor to guide or monitor treatment for MRSA infections. Test performance is not FDA approved in patients less than 79 years old. Performed at [facility], [address] [place], [place] [address]   Remove and replace urinary cath (placed > 5 days) then obtain urine culture from new indwelling urinary catheter.     Status: None   Collection Time: 06/04/2023  8:49 PM   Specimen: Urine, Catheterized  Result Value Ref Range Status   Specimen Description   Final    URINE, CATHETERIZED Performed at [facility], [address], [place], [place] [address]    Special Requests   Final    NONE Performed at [facility], [address], [place], [place] [address]    Culture   Final    NO GROWTH Performed at [facility], [address], [place], [place] [address]    Report Status 05/26/2023 FINAL  Final     Scheduled Meds:  Chlorhexidine Gluconate Cloth  6 each Topical Daily   feeding supplement  237 mL Oral BID BM   fentaNYL  1 patch Transdermal Q72H   Continuous Infusions:  diltiazem (CARDIZEM) infusion 5 mg/hr (05/27/23 1134)    Procedures/Studies: ECHOCARDIOGRAM COMPLETE  Result Date: 05/27/2023    ECHOCARDIOGRAM REPORT   Patient Name:   Alan [name]. Date of Exam: 05/27/2023 Medical Rec #:  [id number]      Height:       65.0 in Accession #:    [id number]     Weight:       141.1 lb Date of Birth:  05-12-1941      BSA:          1.706 m Patient Age:    82 years       BP:           141/59 mmHg Patient Gender: M              HR:           90 bpm. [facility] Procedure: 2D Echo, Cardiac Doppler and Color Doppler Indications:    Atrial Fibrillation I48.91  History:        Patient has prior history of Echocardiogram examinations, most  recent 05/16/2017. Arrythmias:Atrial Fibrillation; Risk                 Factors:Hypertension, Diabetes, Dyslipidemia and Former Smoker.  Sonographer:    Celesta Gentile RCS Referring Phys: [phone] Shamonique Battiste  Sonographer Comments: Image acquisition challenging due to uncooperative patient. IMPRESSIONS  1. Limited windows. Left ventricular ejection fraction, by estimation, is 55 to 60%. The left ventricle has normal function. The left ventricle has no regional wall motion abnormalities. There is moderate asymmetric left ventricular hypertrophy of the basal-septal segment. Left ventricular diastolic parameters are consistent with Grade I diastolic dysfunction (impaired relaxation).  2. Right ventricular systolic function is normal. The right ventricular size is normal. Tricuspid regurgitation signal is inadequate for assessing PA pressure.  3. Left atrial size was moderately dilated.  4. Moderate pleural effusion.  5. The mitral valve is normal in structure. No evidence of mitral valve regurgitation.  6. The aortic valve is grossly normal. Aortic valve regurgitation is not visualized.  7. The inferior vena cava is normal in size with greater than 50% respiratory variability, suggesting right atrial pressure of 3 mmHg. FINDINGS  Left Ventricle: Limited windows. Left ventricular ejection fraction, by estimation, is 55 to 60%. The left ventricle has normal function. The left ventricle has no regional wall motion abnormalities. The left ventricular internal cavity size was normal in size. There is moderate asymmetric left ventricular hypertrophy of the basal-septal segment. Left ventricular diastolic parameters are consistent with Grade I diastolic dysfunction (impaired relaxation). Right Ventricle: The right ventricular size is normal. Right ventricular systolic function is normal. Tricuspid regurgitation signal is inadequate for assessing PA pressure. Left Atrium: Left atrial size was moderately dilated. Right Atrium:  Right atrial size was normal in size. Pericardium: Trivial pericardial effusion is present. Mitral Valve: The mitral valve is normal in structure. No evidence of mitral valve regurgitation. Tricuspid Valve: Tricuspid valve regurgitation is not demonstrated. Aortic Valve: The aortic valve is grossly normal. Aortic valve regurgitation is not visualized. Pulmonic Valve: Pulmonic valve regurgitation is not visualized. Aorta: The aortic root and ascending aorta are structurally normal, with no evidence of dilitation. Venous: The inferior vena cava is normal in size with greater than 50% respiratory variability, suggesting right atrial pressure of 3 mmHg. IAS/Shunts: No atrial level shunt detected by color flow Doppler. Additional Comments: There is a moderate pleural effusion.  LEFT VENTRICLE PLAX 2D LVIDd:         3.40 cm   Diastology LVIDs:         2.40 cm   LV e' medial:    6.20 cm/s LV PW:         1.30 cm   LV E/e' medial:  7.5 LV IVS:        1.70 cm   LV e' lateral:   7.94 cm/s LVOT diam:     2.30 cm   LV E/e' lateral: 5.9 LV SV:         73 LV SV Index:   43 LVOT Area:     4.15 cm  RIGHT VENTRICLE RV S prime:     19.50 cm/s TAPSE (M-mode): 2.4 cm LEFT ATRIUM             Index        RIGHT ATRIUM           Index LA diam:        3.40 cm 1.99 cm/m   RA Area:     15.60 cm LA Vol (A2C):  78.2 ml 45.85 ml/m  RA Volume:   41.30 ml  24.22 ml/m LA Vol (A4C):   71.8 ml 42.10 ml/m LA Biplane Vol: 81.6 ml 47.84 ml/m  AORTIC VALVE LVOT Vmax:   97.90 cm/s LVOT Vmean:  71.100 cm/s LVOT VTI:    0.176 m  AORTA Ao Root diam: 3.90 cm MITRAL VALVE MV Area (PHT): 2.84 cm    SHUNTS MV Decel Time: 268 msec    Systemic VTI:  0.18 m MV E velocity: 46.60 cm/s  Systemic Diam: 2.30 cm MV A velocity: 74.05 cm/s MV E/A ratio:  0.63 [place] signed by Carolan Clines Signature Date/Time: 05/27/2023/10:53:05 AM    Final    [place] CHEST (PLEURAL EFFUSION)  Result Date: 05/25/2023 CLINICAL DATA:  Pleural effusion. EXAM: CHEST  ULTRASOUND COMPARISON:  Chest x-ray from yesterday. FINDINGS: Ultrasound demonstrates moderate left and small right pleural effusions. IMPRESSION: 1. Moderate left and small right pleural effusions. Electronically Signed   By: Obie Dredge M.D.   On: 05/25/2023 15:38   CT ABDOMEN PELVIS W CONTRAST  Result Date: 05/23/2023 CLINICAL DATA:  Metastatic prostate cancer. Sepsis, altered mental status. * Tracking Code: BO * EXAM: CT ABDOMEN AND PELVIS WITH CONTRAST TECHNIQUE: Multidetector CT imaging of the abdomen and pelvis was performed using the standard protocol following bolus administration of intravenous contrast. RADIATION DOSE REDUCTION: This exam was performed according to the departmental dose-optimization program which includes automated exposure control, adjustment of the mA and/or kV according to patient size and/or use of iterative reconstruction technique. CONTRAST:  75mL OMNIPAQUE IOHEXOL 300 MG/ML  SOLN COMPARISON:  01/11/2023 FINDINGS: Lower chest: Airspace filling consolidation appearance in the right lower lobe. Passive atelectasis in the left lower lobe. Moderate to large left and small right pleural effusion. Descending thoracic aortic atherosclerosis. Hepatobiliary: Heterogeneous enhancement in the liver with higher density peripherally, probably related to an unusual distribution of steatosis although hepatitis might also be considered. Cholecystectomy. No biliary dilatation or pneumobilia. Pancreas: Pancreas is somewhat blurred by motion artifact but there continues to be dilatation of the dorsal pancreatic duct up to 5 mm in diameter, not substantially changed from 01/11/2023. 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 Spleen: The spleen measures 11.3 by 7.9 by 11.8 cm (volume = 550 cm^3) and demonstrates multiple new small  hypodense lesions which were not present on 01/11/2023, concerning for microabscesses given the clinical context. Splenic involvement would be an unusual pathway for spread of prostate cancer. Adrenals/Urinary Tract: Bilateral thickened adrenal glands, increased from prior. The kidneys appear unremarkable. Foley catheter in the otherwise empty urinary bladder. Stomach/Bowel: Small type 1 hiatal hernia. Suspected varices along the margin of the proximal stomach. Large indirect right inguinal hernia containing multiple loops of small bowel, the bottom of the cecum, and the appendix. Because these loops of bowel extend far down in the distended scrotum and well below the ischial tuberosities, the entirety of the bowel down in the scrotum is not included. Sigmoid colon diverticulosis. Moderate prominence of rectal stool ball. 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 Atherosclerosis is present, including aortoiliac atherosclerotic disease. There is atheromatous plaque proximally in the celiac trunk and SMA without overt occlusion. Reproductive: Brachytherapy seed implants in the indistinctly marginated prostate gland. Other: Small amount of edema along the root of the mesentery. Mild nonspecific presacral  edema. Musculoskeletal: Diffuse patchy bony sclerosis compatible with widespread osseous metastatic disease. Lumbar spondylosis causes mild multilevel foraminal impingement. Large right indirect inguinal hernia containing loops of bowel including small bowel loops and the appendix. A small indirect left inguinal hernia contains adipose tissue. IMPRESSION: 1. Multiple new small hypodense lesions in the spleen, concerning for microabscesses given the clinical context. 2. Airspace filling consolidation in the right lower lobe, suspicious for pneumonia. 3. Moderate to large left and small right pleural effusion. 4. Heterogeneous enhancement in the liver with  higher density peripherally, probably related to an unusual distribution of steatosis although hepatitis might also be considered. 5. 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 6. Large indirect right inguinal hernia containing multiple loops of small bowel, the bottom of the cecum, and the appendix. Because these loops of bowel extend far down in the distended scrotum and well below the ischial tuberosities, the entirety of the bowel down in the scrotum is not included. 7. Sigmoid colon diverticulosis. 8. Small type 1 hiatal hernia. 9. 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 10. Brachytherapy seed implants in the indistinctly marginated prostate gland. 11. Aortic atherosclerosis. Aortic Atherosclerosis (ICD10-I70.0). Electronically Signed   By: Gaylyn Rong M.D.   On: 05/15/2023 17:44   CT Head Wo Contrast  Result Date: 05/21/2023 CLINICAL DATA:  Altered mental status EXAM: CT HEAD WITHOUT CONTRAST TECHNIQUE: Contiguous axial images were obtained from the base of the skull through the vertex without intravenous contrast. RADIATION DOSE REDUCTION: This exam was performed according to the departmental dose-optimization program which includes automated exposure control, adjustment of the mA and/or kV according to patient size and/or use of iterative reconstruction technique. COMPARISON:  None Available. FINDINGS: Brain: No acute intracranial findings are seen. There are no signs of bleeding within the cranium. Dense calcifications are seen in basal ganglia on both sides. Cortical sulci are prominent. There is decreased density in periventricular white matter. Vascular: There are scattered coarse arterial calcifications. Skull: No acute findings are seen. Sinuses/Orbits: Unremarkable. Other: None. IMPRESSION: No acute intracranial findings are seen.  Atrophy. Small-vessel disease. Electronically Signed   By: Ernie Avena M.D.   On: 05/31/2023 17:32   DG Chest Portable 1 View  Result Date: 05/15/2023 CLINICAL DATA:  Altered mental status.  History of prostate cancer EXAM: PORTABLE CHEST 1 VIEW COMPARISON:  X-ray 03/27/2022 and older FINDINGS: Developing small left effusion and adjacent lung opacity. Possible infiltrate. No pneumothorax or edema. Normal cardiopericardial silhouette. Overlapping cardiac leads. Surgical clips in the right upper quadrant. Scattered areas of bony sclerosis identified consistent with known osseous metastatic disease from prostate cancer. IMPRESSION: New small left effusion and adjacent opacity. Possible infiltrate. Recommend follow-up Electronically Signed   By: Karen Kays M.D.   On: 05/21/2023 15:15    Catarina Hartshorn, DO  [facility]  If 7PM-7AM, please contact night-coverage [email or web address] Password [facility] 05/28/2023, 9:14 AM   LOS: 4 days

## 2023-05-28 NOTE — Consult Note (Signed)
Consultation Note Date: 05/28/2023   Patient Name: Alan [name].  DOB: [birth year]  MRN: [id number]  Age / Sex: 82 y.o., male  PCP: Dettinger, Elige Radon, MD Referring Physician: Catarina Hartshorn, MD  Reason for Consultation:  "DNR, hospice patient."  HPI/Patient Profile: 82 y.o. male  with past medical history of metastatic prostate cancer- was on hospice at home  admitted on 06/02/2023 with sepsis due to pneumonia. Palliative consulted for goals of care and assistance with medical decision making.   Primary Decision Maker NEXT OF KIN - spouse  Discussion: Chart reviewed including labs, progress notes, imaging from this and previous encounters.  Mr. Heinzel unfortunately has not recovered from his sepsis. Noted that patient was transitioned to comfort measures by attending provider.  Evaluated patient- he did not respond to my voice or touch. He had labored breathing and appeared a bit agitated.  Met with his spouse, son, and granddaughter at bedside. Reviewed full comfort measures and allowing for natural dying process with support and symptom management. They would like to proceed with full comfort measures.     SUMMARY OF RECOMMENDATIONS -D/C cardizem, CBG's, and other interventions not contributing to full comfort -Hydromorphone 0.5mg  IV q4 hours for discomfort and SOB -Hydromorphone 0.5mg  IV q2hrs prn for discomfort and SOB -Other comfort medications as ordered    Code Status/Advance Care Planning: DNR   Prognosis:   Hours - Days  Discharge Planning: Anticipated Hospital Death  Primary Diagnoses: Present on Admission:  Severe sepsis (HCC)  Acute metabolic encephalopathy  Lobar pneumonia (HCC)  Splenic abscess  Atrial fibrillation with RVR (HCC)  AKI (acute kidney injury) (HCC)  Thrombocytopenia (HCC)  Malignant neoplasm metastatic to bone (HCC)   Review of Systems  Unable to perform ROS:  Mental status change    Physical Exam Vitals and nursing note reviewed.  Constitutional:      Comments: Frail, cachetic  Pulmonary:     Comments: Increased effort and rate Neurological:     Comments: unresponsive     Vital Signs: BP (!) 138/28   Pulse (!) 45   Temp 98.6 F (37 C) (Oral)   Resp (!) 26   Ht 5\' 5"  (1.651 m)   Wt 64 kg   SpO2 (!) 82%   BMI 23.48 kg/m  Pain Scale: PAINAD POSS *See Group Information*: 1-Acceptable,Awake and alert Pain Score: Asleep   SpO2: SpO2: (!) 82 % O2 Device:SpO2: (!) 82 % O2 Flow Rate: .O2 Flow Rate (L/min): 2 L/min  IO: Intake/output summary:  Intake/Output Summary (Last 24 hours) at 05/28/2023 1315 Last data filed at 05/28/2023 1146 Gross per 24 hour  Intake 335.92 ml  Output 525 ml  Net -189.08 ml    LBM: Last BM Date : 05/25/23 Baseline Weight: Weight: 58 kg Most recent weight: Weight: 64 kg       Thank you for this consult. Palliative medicine will continue to follow and assist as needed.   Greater than 50%  of this time was spent counseling and coordinating care related to  the above assessment and plan.  Signed by: Mariana Kaufman, AGNP-C Palliative Medicine    Please contact Palliative Medicine Team phone at [phone] for questions and concerns.  For individual provider: See Shea Evans

## 2023-05-28 NOTE — Telephone Encounter (Signed)
Okay thanks for letting me know

## 2023-05-29 DIAGNOSIS — Z0279 Encounter for issue of other medical certificate: Secondary | ICD-10-CM

## 2023-05-29 LAB — CULTURE, BLOOD (ROUTINE X 2)
Culture: NO GROWTH
Culture: NO GROWTH
Special Requests: ADEQUATE

## 2023-05-30 ENCOUNTER — Telehealth: Payer: Self-pay | Admitting: Family Medicine

## 2023-06-04 ENCOUNTER — Ambulatory Visit: Payer: No Typology Code available for payment source | Admitting: Family Medicine

## 2023-06-11 NOTE — Death Summary Note (Signed)
DEATH SUMMARY   Patient Details  Name: Alan [name]. MRN: [id number] DOB: [birth year] AYT:[id number], Elige Radon, MD Admission/Discharge Information   Admit Date:  06-13-23  Date of Death: Date of Death: 06-18-23  Time of Death: Time of Death: 0100  Length of Stay: 5   Principle Cause of death: severe sepsis due to pneumonia  Hospital Diagnoses: Principal Problem:   Severe sepsis (HCC) Active Problems:   AKI (acute kidney injury) (HCC)   Lobar pneumonia (HCC)   Acute metabolic encephalopathy   Splenic abscess   Atrial fibrillation with RVR (HCC)   Thrombocytopenia (HCC)   Malignant neoplasm metastatic to bone [facility])   Hospital Course: 82 year old male with a history of metastatic prostate cancer to bone, hypertrophic cardiomyopathy, diabetes mellitus type 2, hypertension, hyperlipidemia, paroxysmal atrial fibrillation, right lower extremity DVT presenting with Patient is currently hospice patient.  He was brought to the ED with complaints of altered mental status over the past 2 days.  His last meal was about 2 days ago, he has not eaten since or drunk since then and has been confused.  In the emergency department, the patient was somnolent with his eyes closed.  He was able to open his eyes with touch but unable to provide any history.  Spouse denies any vomiting, diarrhea, hematochezia, hematemesis.  He has dark stools chronically. Recent hospitalization 6/3 to 05/15/2023 for symptomatic acute blood loss anemia, required 4 units PRBC, patient declined aggressive care and endoscopy.   ED Course: Febrile to 102, tachycardic heart rate initially up to 150s improved to 120s, tachypneic heart rate 29-49.  Hypotensive down to 73/45.  WBC 4.9.  Lactic acidosis 5.4 > 4.9.  Cr elevated 1.63.  Platelets low at 29. Blood Cultures obtained Head CT negative for acute abnormality. Abdominal CT-stain for microabscesses in the spleen, consolidation suspicious for pneumonia, moderate to large left  and small right pleural effusion, loops of bowel extending further down in distended scrotum well below the ischial tuberosities. IV Vanco cefepime and metronidazole started. During the hospitalization, the patient was noted to have AKI.  He was started on IV fluids.  His renal function did improve.  His BP overall improved with antibiotics and IV fluids.  The patient did require oxygenation up to 6 L.  This was gradually weaned down to 2 L. During the hospitalization, patient developed afib with RVR.  He was started on diltiazem drip. Unfortunately, the patient remained encephalopathic throughout the hospitalization.  His mental status did not improve much.  In fact, his mentation showed a gradual decline.  He became more somnolent despite treatment of his sepsis, atrial fibrillation, and AKI. Subsequently, his somnolence and reduced ability to interact with his family as well as his ability to take any oral intake in a safe manner.  His oral intake continues to decline. Goals of care discussions were held with the patient's spouse and daughter.  We discussed a gradual decline in the patient's overall medical condition and high risk for repeated hospitalizations.  We discussed the patient's inability to take any oral intake in a safe manner.  The patient's spouse and daughter understood the patient's overall poor prognosis and that current medical therapy is prolonging the patient's suffering.  As such, the family wished to transition the patient's focus of care to comfort measures.    Assessment and Plan:  Severe sepsis [facility]) Meeting severe sepsis with fever of 102, tachycardia heart rate 117-152, tachypnea respiratory rate 29-44, with evidence of multiple endorgan dysfunction, significant  lactic acidosis of 5.4 > 4.9, encephalopathy, AKI, thrombocytopenia.  Etiology likely secondary to pneumonia -COVID, influenza negative. -Continue broad-spectrum antibiotics IV vancomycin cefepime and  metronidazole -Follow up blood cultures--neg to date -urine cultures--neg -3 Liter LR bolus given, continue N/s 100cc/hr x 15hrs -Patient is a hospice patient.  ED provider, and myself have had extensive discussion with patient's family- spouse at bedside about expectation considering severity of illness, and goals of care. They are reasonable want IV antibiotics, fluids, and treating the treatable without aggressive interventions like central lines, and I confirmed that patient's is DNR initially -unfortunately, he did not improve much clinically with abx.  His overall medical condition showed a gradual decline -05/27/2023--after goals of care discussion, the patient's focus of care is being transitioned to comfort measures   Acute respiratory failure with hypoxia -Initially on 6 L nasal cannula -Secondary to pneumonia and pleural effusions -Currently on 2 L nasal cannula -05/27/2023--after goals of care discussion, the patient's focus of care is being transitioned to comfort measures   Lobar Pneumonia -noted on CT abd/pelvis -initially on vanc, cefepime,  metronidazole -d/c vanc and metronidazole -05/27/2023--after goals of care discussion, the patient's focus of care is being transitioned to comfort measures   Pleural effusion -05/25/23 [place] chest--R>L pleural effusion -request right thora if cannot wean oxygen -discussed with spouse and she consents after discussion of risks, benefits, alternatives--now spouse does not want thora -05/27/2023--after goals of care discussion, the patient's focus of care is being transitioned to comfort measures   AKI -due to hemodynamic changes and volume depletion -baseline creatinine 0.9-1.1 -presented with serum creatinine 1.63 -Continued on IV fluids initially with some improvement -05/27/2023--after goals of care discussion, the patient's focus of care is being transitioned to comfort measures   Thrombocytopenia (HCC) Platelets 29, last checked 9  days ago platelets were 65, gradual decline over the past month from 160s. -acute drop in part due to acute infection -Check B12--879  Paroxysmal Atrial fibrillation with RVR (HCC) -Rate up to 140s,  -personally reviewed EKG-atrial fibrillation, nonsp STT changes.   -Not on anticoagulation due to history of acute blood loss anemias, recent hospitalization requiring 4 units PRBC 6/3-05/15/23.  -initially held metoprolol due to soft BPs -start dilitazem drip 6/15 -05/27/2023--after goals of care discussion, the patient's focus of care is being transitioned to comfort measures   Metastatic prostate cancer to bone -CT abdomen and pelvis on 01/11/2023 shows progression of his disease.   -Saw Dr. Ellin Saba 02/21/2023--no longer candidate for active therapy/chemotherapy   Neoplastic associated pain -Continue oxycodone and TD fentanyl -PDMP reviewed--patient receives oxycodone 5 mg, 90, last refill 05/09/2023 -Fentanyl transdermal 12.5 mcg/h, last refill 05/03/2023 -Now that the patient is more somnolent, transition to IV Dilaudid for pain control   Acute metabolic encephalopathy -remains somonlent>> not safe oral intake -Not much improvement -due to infectious process -05/27/2023---after goals of care discussion, the patient's focus of care is being transitioned to comfort measures   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 Verdis Prime -Supportive care at this point -holding metoprolol due to low BPs initially   Essential hypertension -Holding amlodipine -holding metoprolol due to  hypotension   Severe malnutrition -continue  Ensure Plus   Acute anemia/Symptomatic anemia -Hgb down to 6.8 -transfused 2 units PRBC on 05/26/2023 -spouse does not want any invasive procedures -after goals of care discussion, the patient's focus of care is being transitioned to comfort measures   Goals of Care -05/27/2023-GOC discussion with the patient's spouse and daughter -Transition to full comfort measures        Procedures: none  Consultations: palliative  The results of significant diagnostics from this hospitalization (including imaging, microbiology, ancillary and laboratory) are listed below for reference.   Significant Diagnostic Studies: ECHOCARDIOGRAM COMPLETE  Result Date: 05/27/2023    ECHOCARDIOGRAM REPORT   Patient Name:   Erasmo Dimarzio. Date of Exam: 05/27/2023 Medical Rec #:  [id number]      Height:       65.0 in Accession #:    [id number]     Weight:       141.1 lb Date of Birth:  02-07-1941      BSA:          1.706 m Patient Age:    82 years       BP:           141/59 mmHg Patient Gender: M              HR:           90 bpm. [facility] Procedure: 2D Echo, Cardiac Doppler and Color Doppler Indications:    Atrial Fibrillation I48.91  History:        Patient has prior history of Echocardiogram examinations, most                 recent 05/16/2017. Arrythmias:Atrial Fibrillation; Risk                 Factors:Hypertension, Diabetes, Dyslipidemia and Former Smoker.  Sonographer:    Celesta Gentile RCS Referring Phys: [phone] Heidee Audi  Sonographer Comments: Image acquisition challenging due to uncooperative patient. IMPRESSIONS  1. Limited windows. Left ventricular ejection fraction, by estimation, is 55 to 60%. The left ventricle has normal function. The left ventricle has no regional wall motion abnormalities. There is moderate asymmetric left ventricular hypertrophy of the basal-septal segment. Left ventricular diastolic parameters are consistent with Grade I  diastolic dysfunction (impaired relaxation).  2. Right ventricular systolic function is normal. The right ventricular size is normal. Tricuspid regurgitation signal is inadequate for assessing PA pressure.  3. Left atrial size was moderately dilated.  4. Moderate pleural effusion.  5. The mitral valve is normal in structure. No evidence of mitral valve regurgitation.  6. The aortic valve is grossly normal. Aortic valve regurgitation is not visualized.  7. The inferior vena cava is normal in size with greater than 50% respiratory variability, suggesting right atrial pressure of 3 mmHg. FINDINGS  Left Ventricle: Limited windows. Left ventricular ejection fraction, by estimation, is 55 to 60%. The left ventricle has normal function. The left ventricle has no regional wall motion abnormalities. The left ventricular internal cavity size was normal in size. There is moderate asymmetric left ventricular hypertrophy of the basal-septal segment. Left ventricular diastolic parameters are consistent with Grade I diastolic dysfunction (impaired relaxation). Right Ventricle: The right ventricular size is normal. Right ventricular systolic function is normal. Tricuspid regurgitation signal is inadequate for assessing PA pressure. Left Atrium: Left atrial size was moderately dilated. Right Atrium: Right atrial size was normal in size. Pericardium: Trivial pericardial effusion is present. Mitral Valve: The mitral valve is normal in structure.  No evidence of mitral valve regurgitation. Tricuspid Valve: Tricuspid valve regurgitation is not demonstrated. Aortic Valve: The aortic valve is grossly normal. Aortic valve regurgitation is not visualized. Pulmonic Valve: Pulmonic valve regurgitation is not visualized. Aorta: The aortic root and ascending aorta are structurally normal, with no evidence of dilitation. Venous: The inferior vena cava is normal in size with greater than 50% respiratory variability, suggesting right atrial  pressure of 3 mmHg. IAS/Shunts: No atrial level shunt detected by color flow Doppler. Additional Comments: There is a moderate pleural effusion.  LEFT VENTRICLE PLAX 2D LVIDd:         3.40 cm   Diastology LVIDs:         2.40 cm   LV e' medial:    6.20 cm/s LV PW:         1.30 cm   LV E/e' medial:  7.5 LV IVS:        1.70 cm   LV e' lateral:   7.94 cm/s LVOT diam:     2.30 cm   LV E/e' lateral: 5.9 LV SV:         73 LV SV Index:   43 LVOT Area:     4.15 cm  RIGHT VENTRICLE RV S prime:     19.50 cm/s TAPSE (M-mode): 2.4 cm LEFT ATRIUM             Index        RIGHT ATRIUM           Index LA diam:        3.40 cm 1.99 cm/m   RA Area:     15.60 cm LA Vol (A2C):   78.2 ml 45.85 ml/m  RA Volume:   41.30 ml  24.22 ml/m LA Vol (A4C):   71.8 ml 42.10 ml/m LA Biplane Vol: 81.6 ml 47.84 ml/m  AORTIC VALVE LVOT Vmax:   97.90 cm/s LVOT Vmean:  71.100 cm/s LVOT VTI:    0.176 m  AORTA Ao Root diam: 3.90 cm MITRAL VALVE MV Area (PHT): 2.84 cm    SHUNTS MV Decel Time: 268 msec    Systemic VTI:  0.18 m MV E velocity: 46.60 cm/s  Systemic Diam: 2.30 cm MV A velocity: 74.05 cm/s MV E/A ratio:  0.63 [place] signed by Carolan Clines Signature Date/Time: 05/27/2023/10:53:05 AM    Final    [place] CHEST (PLEURAL EFFUSION)  Result Date: 05/25/2023 CLINICAL DATA:  Pleural effusion. EXAM: CHEST ULTRASOUND COMPARISON:  Chest x-ray from yesterday. FINDINGS: Ultrasound demonstrates moderate left and small right pleural effusions. IMPRESSION: 1. Moderate left and small right pleural effusions. Electronically Signed   By: Obie Dredge M.D.   On: 05/25/2023 15:38   CT ABDOMEN PELVIS W CONTRAST  Result Date: 06/07/2023 CLINICAL DATA:  Metastatic prostate cancer. Sepsis, altered mental status. * Tracking Code: BO * EXAM: CT ABDOMEN AND PELVIS WITH CONTRAST TECHNIQUE: Multidetector CT imaging of the abdomen and pelvis was performed using the standard protocol following bolus administration of intravenous contrast. RADIATION  DOSE REDUCTION: This exam was performed according to the departmental dose-optimization program which includes automated exposure control, adjustment of the mA and/or kV according to patient size and/or use of iterative reconstruction technique. CONTRAST:  75mL OMNIPAQUE IOHEXOL 300 MG/ML  SOLN COMPARISON:  01/11/2023 FINDINGS: Lower chest: Airspace filling consolidation appearance in the right lower lobe. Passive atelectasis in the left lower lobe. Moderate to large left and small right pleural effusion. Descending thoracic aortic atherosclerosis. Hepatobiliary: Heterogeneous enhancement  in the liver with higher density peripherally, probably related to an unusual distribution of steatosis although hepatitis might also be considered. Cholecystectomy. No biliary dilatation or pneumobilia. Pancreas: Pancreas is somewhat blurred by motion artifact but there continues to be dilatation of the dorsal pancreatic duct up to 5 mm in diameter, not substantially changed from 01/11/2023. 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 Spleen: The spleen measures 11.3 by 7.9 by 11.8 cm (volume = 550 cm^3) and demonstrates multiple new small hypodense lesions which were not present on 01/11/2023, concerning for microabscesses given the clinical context. Splenic involvement would be an unusual pathway for spread of prostate cancer. Adrenals/Urinary Tract: Bilateral thickened adrenal glands, increased from prior. The kidneys appear unremarkable. Foley catheter in the otherwise empty urinary bladder. Stomach/Bowel: Small type 1 hiatal hernia. Suspected varices along the margin of the proximal stomach. Large indirect right inguinal hernia containing multiple loops of small bowel, the bottom of the cecum, and the appendix. Because these loops of bowel extend far down in the  distended scrotum and well below the ischial tuberosities, the entirety of the bowel down in the scrotum is not included. Sigmoid colon diverticulosis. Moderate prominence of rectal stool ball. 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 Atherosclerosis is present, including aortoiliac atherosclerotic disease. There is atheromatous plaque proximally in the celiac trunk and SMA without overt occlusion. Reproductive: Brachytherapy seed implants in the indistinctly marginated prostate gland. Other: Small amount of edema along the root of the mesentery. Mild nonspecific presacral edema. Musculoskeletal: Diffuse patchy bony sclerosis compatible with widespread osseous metastatic disease. Lumbar spondylosis causes mild multilevel foraminal impingement. Large right indirect inguinal hernia containing loops of bowel including small bowel loops and the appendix. A small indirect left inguinal hernia contains adipose tissue. IMPRESSION: 1. Multiple new small hypodense lesions in the spleen, concerning for microabscesses given the clinical context. 2. Airspace filling consolidation in the right lower lobe, suspicious for pneumonia. 3. Moderate to large left and small right pleural effusion. 4. Heterogeneous enhancement in the liver with higher density peripherally, probably related to an unusual distribution of steatosis although hepatitis might also be considered. 5. 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 6. Large indirect right inguinal hernia containing multiple loops of small bowel, the bottom of the cecum, and the appendix. Because these loops of bowel extend far down in the distended scrotum and well below the ischial tuberosities, the entirety of the bowel down in the scrotum is not included. 7. Sigmoid colon diverticulosis. 8. Small type  1 hiatal hernia. 9. 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 10. Brachytherapy seed implants in the indistinctly marginated prostate gland. 11. Aortic atherosclerosis. Aortic Atherosclerosis (ICD10-I70.0). Electronically Signed   By: Gaylyn Rong M.D.   On: 05/16/2023 17:44   CT Head Wo Contrast  Result Date: 06/02/2023 CLINICAL DATA:  Altered mental status EXAM: CT HEAD WITHOUT CONTRAST TECHNIQUE: Contiguous axial images were obtained from the base of the skull through the vertex without intravenous contrast. RADIATION DOSE REDUCTION: This exam was performed according to the departmental dose-optimization program which includes automated exposure control, adjustment of the mA and/or kV according to patient size and/or use of iterative reconstruction technique. COMPARISON:  None Available.  FINDINGS: Brain: No acute intracranial findings are seen. There are no signs of bleeding within the cranium. Dense calcifications are seen in basal ganglia on both sides. Cortical sulci are prominent. There is decreased density in periventricular white matter. Vascular: There are scattered coarse arterial calcifications. Skull: No acute findings are seen. Sinuses/Orbits: Unremarkable. Other: None. IMPRESSION: No acute intracranial findings are seen. Atrophy. Small-vessel disease. Electronically Signed   By: Ernie Avena M.D.   On: 05/25/2023 17:32   DG Chest Portable 1 View  Result Date: 05/19/2023 CLINICAL DATA:  Altered mental status.  History of prostate cancer EXAM: PORTABLE CHEST 1 VIEW COMPARISON:  X-ray 03/27/2022 and older FINDINGS: Developing small left effusion and adjacent lung opacity. Possible infiltrate. No pneumothorax or edema. Normal cardiopericardial silhouette. Overlapping cardiac leads. Surgical clips in the right upper quadrant. Scattered areas of bony sclerosis identified consistent with known osseous metastatic disease from prostate cancer.  IMPRESSION: New small left effusion and adjacent opacity. Possible infiltrate. Recommend follow-up Electronically Signed   By: Karen Kays M.D.   On: 05/30/2023 15:15    Microbiology: Recent Results (from the past 240 hour(s))  Culture, blood (Routine x 2)     Status: None   Collection Time: 05/23/2023  2:38 PM   Specimen: BLOOD  Result Value Ref Range Status   Specimen Description BLOOD BLOOD RIGHT HAND  Final   Special Requests   Final    BOTTLES DRAWN AEROBIC AND ANAEROBIC Blood Culture adequate volume   Culture   Final    NO GROWTH 5 DAYS Performed at [facility], [address] [place], [place] [address]    Report Status 06/07/2023 FINAL  Final  Culture, blood (Routine x 2)     Status: None   Collection Time: 05/14/2023  2:38 PM   Specimen: BLOOD  Result Value Ref Range Status   Specimen Description BLOOD BLOOD LEFT ARM  Final   Special Requests   Final    BOTTLES DRAWN AEROBIC AND ANAEROBIC Blood Culture adequate volume   Culture   Final    NO GROWTH 5 DAYS Performed at [facility], [address] [place], [place] [address]    Report Status 05/17/2023 FINAL  Final  Resp panel by RT-PCR (RSV, Flu A&B, Covid) Anterior Nasal Swab     Status: None   Collection Time: 06/02/2023  3:03 PM   Specimen: Anterior Nasal Swab  Result Value Ref Range Status   SARS Coronavirus 2 by RT PCR NEGATIVE NEGATIVE Final    Comment: (NOTE) SARS-CoV-2 target nucleic acids are NOT DETECTED.  The SARS-CoV-2 RNA is generally detectable in upper respiratory specimens during the acute phase of infection. The lowest concentration of SARS-CoV-2 viral copies this assay can detect is 138 copies/mL. A negative result does not preclude SARS-Cov-2 infection and should not be used as the sole basis for treatment or other patient management decisions. A negative result may occur with  improper specimen collection/handling, submission of specimen other than nasopharyngeal swab, presence of viral mutation(s)  within the areas targeted by this assay, and inadequate number of viral copies(<138 copies/mL). A negative result must be combined with clinical observations, patient history, and epidemiological information. The expected result is Negative.  Fact Sheet for Patients:  BloggerCourse.com  Fact Sheet for Healthcare Providers:  SeriousBroker.it  This test is no t yet approved or cleared by the [place] FDA and  has been authorized for detection and/or diagnosis of SARS-CoV-2 by FDA under an Emergency Use Authorization (EUA). This EUA  will remain  in effect (meaning this test can be used) for the duration of the COVID-19 declaration under Section 564(b)(1) of the Act, 21 U.S.C.section 360bbb-3(b)(1), unless the authorization is terminated  or revoked sooner.       Influenza A by PCR NEGATIVE NEGATIVE Final   Influenza B by PCR NEGATIVE NEGATIVE Final    Comment: (NOTE) The Xpert Xpress SARS-CoV-2/FLU/RSV plus assay is intended as an aid in the diagnosis of influenza from Nasopharyngeal swab specimens and should not be used as a sole basis for treatment. Nasal washings and aspirates are unacceptable for Xpert Xpress SARS-CoV-2/FLU/RSV testing.  Fact Sheet for Patients: BloggerCourse.com  Fact Sheet for Healthcare Providers: SeriousBroker.it  This test is not yet approved or cleared by the [place] FDA and has been authorized for detection and/or diagnosis of SARS-CoV-2 by FDA under an Emergency Use Authorization (EUA). This EUA will remain in effect (meaning this test can be used) for the duration of the COVID-19 declaration under Section 564(b)(1) of the Act, 21 U.S.C. section 360bbb-3(b)(1), unless the authorization is terminated or revoked.     Resp Syncytial Virus by PCR NEGATIVE NEGATIVE Final    Comment: (NOTE) Fact Sheet for  Patients: BloggerCourse.com  Fact Sheet for Healthcare Providers: SeriousBroker.it  This test is not yet approved or cleared by the [place] FDA and has been authorized for detection and/or diagnosis of SARS-CoV-2 by FDA under an Emergency Use Authorization (EUA). This EUA will remain in effect (meaning this test can be used) for the duration of the COVID-19 declaration under Section 564(b)(1) of the Act, 21 U.S.C. section 360bbb-3(b)(1), unless the authorization is terminated or revoked.  Performed at [facility], [address] [place], [place] [address]   MRSA Next Gen by PCR, Nasal     Status: None   Collection Time: 05/15/2023  7:58 PM   Specimen: Nasal Mucosa; Nasal Swab  Result Value Ref Range Status   MRSA by PCR Next Gen NOT DETECTED NOT DETECTED Final    Comment: (NOTE) The GeneXpert MRSA Assay (FDA approved for NASAL specimens only), is one component of a comprehensive MRSA colonization surveillance program. It is not intended to diagnose MRSA infection nor to guide or monitor treatment for MRSA infections. Test performance is not FDA approved in patients less than 50 years old. Performed at [facility], [address] [place], [place] [address]   Remove and replace urinary cath (placed > 5 days) then obtain urine culture from new indwelling urinary catheter.     Status: None   Collection Time: 05/14/2023  8:49 PM   Specimen: Urine, Catheterized  Result Value Ref Range Status   Specimen Description   Final    URINE, CATHETERIZED Performed at [facility], [address], [place], [place] [address]    Special Requests   Final    NONE Performed at [facility], [address], [place], [place] [address]    Culture   Final    NO GROWTH Performed at [facility], [address] [place], [place] [address]    Report Status 05/26/2023 FINAL  Final      Signed: Catarina Hartshorn, MD 05/21/2023

## 2023-06-11 NOTE — Progress Notes (Cosign Needed Addendum)
Time of Death 0100, 2 Nurse verify with Charge RN Thera Flake and Shaune Leeks RN

## 2023-06-11 NOTE — Progress Notes (Signed)
Apical heart appreciated for one minute.  All lung fields appreciated for one minute each field.  No breathing noted and no heart beat appreciated.  The patient was pronounced dead at 0100 by two nurse verification.  Family made aware and post-mortem procedure completed.

## 2023-06-11 DEATH — deceased

## 2023-07-05 NOTE — Progress Notes (Signed)
Request received from [facility] for medical records regarding NGS testing as well as a letter of medical necessity. Office visit note from January 15, 2023 and [facility] faxed to [phone]

## 2023-08-02 ENCOUNTER — Telehealth: Payer: Self-pay | Admitting: Hematology

## 2023-08-15 ENCOUNTER — Ambulatory Visit: Payer: No Typology Code available for payment source | Admitting: Family Medicine

## 2024-07-11 ENCOUNTER — Other Ambulatory Visit (HOSPITAL_BASED_OUTPATIENT_CLINIC_OR_DEPARTMENT_OTHER): Payer: Self-pay
